# Patient Record
Sex: Female | Born: 1960 | Race: Black or African American | Hispanic: No | Marital: Single | Smoking: Former smoker
Health system: Southern US, Community
[De-identification: ages and names within clinical notes are randomized; demographics above are authoritative.]

## PROBLEM LIST (undated history)

## (undated) DIAGNOSIS — N189 Chronic kidney disease, unspecified: Secondary | ICD-10-CM

## (undated) DIAGNOSIS — I714 Abdominal aortic aneurysm, without rupture, unspecified: Secondary | ICD-10-CM

## (undated) DIAGNOSIS — D649 Anemia, unspecified: Secondary | ICD-10-CM

## (undated) DIAGNOSIS — K439 Ventral hernia without obstruction or gangrene: Secondary | ICD-10-CM

## (undated) DIAGNOSIS — I10 Essential (primary) hypertension: Secondary | ICD-10-CM

## (undated) DIAGNOSIS — K219 Gastro-esophageal reflux disease without esophagitis: Secondary | ICD-10-CM

## (undated) HISTORY — DX: Abdominal aortic aneurysm, without rupture: I71.4

## (undated) HISTORY — PX: FINGER SURGERY: SHX640

## (undated) HISTORY — DX: Ventral hernia without obstruction or gangrene: K43.9

## (undated) HISTORY — DX: Abdominal aortic aneurysm, without rupture, unspecified: I71.40

## (undated) HISTORY — PX: HERNIA REPAIR: SHX51

## (undated) HISTORY — PX: OTHER SURGICAL HISTORY: SHX169

---

## 2001-02-01 ENCOUNTER — Encounter: Payer: Self-pay | Admitting: Emergency Medicine

## 2001-02-01 ENCOUNTER — Inpatient Hospital Stay (HOSPITAL_COMMUNITY): Admission: EM | Admit: 2001-02-01 | Discharge: 2001-02-04 | Payer: Self-pay | Admitting: Emergency Medicine

## 2008-05-26 ENCOUNTER — Emergency Department (HOSPITAL_COMMUNITY): Admission: EM | Admit: 2008-05-26 | Discharge: 2008-05-26 | Payer: Self-pay | Admitting: Emergency Medicine

## 2010-07-23 LAB — DIFFERENTIAL
Eosinophils Absolute: 0.1 10*3/uL (ref 0.0–0.7)
Eosinophils Relative: 1 % (ref 0–5)
Lymphs Abs: 1.3 10*3/uL (ref 0.7–4.0)
Monocytes Relative: 5 % (ref 3–12)

## 2010-07-23 LAB — COMPREHENSIVE METABOLIC PANEL
ALT: 24 U/L (ref 0–35)
AST: 27 U/L (ref 0–37)
CO2: 24 mEq/L (ref 19–32)
Calcium: 9.8 mg/dL (ref 8.4–10.5)
GFR calc Af Amer: >60 mL/min (ref >60)
GFR calc non Af Amer: >60 mL/min (ref >60)
Sodium: 140 mEq/L (ref 135–145)
Total Protein: 8.2 g/dL (ref 6.0–8.3)

## 2010-07-23 LAB — CBC
HCT: 42.2 % (ref 36.0–46.0)
Hemoglobin: 14.2 g/dL (ref 12.0–15.0)
MCHC: 33.7 g/dL (ref 30.0–36.0)
MCV: 88.8 fL (ref 78.0–100.0)
Platelets: 379 10*3/uL (ref 150–400)
RBC: 4.75 MIL/uL (ref 3.87–5.11)
RDW: 19.3 % — ABNORMAL HIGH (ref 11.5–15.5)
WBC: 14.3 10*3/uL — ABNORMAL HIGH (ref 4.0–10.5)

## 2010-07-23 LAB — URINE CULTURE: Colony Count: 15000

## 2010-07-23 LAB — ETHANOL: Alcohol, Ethyl (B): 37 mg/dL — ABNORMAL HIGH (ref 0–10)

## 2010-07-23 LAB — ACETAMINOPHEN LEVEL: Acetaminophen (Tylenol), Serum: <10 ug/mL — ABNORMAL LOW (ref 10–30)

## 2010-07-23 LAB — RAPID URINE DRUG SCREEN, HOSP PERFORMED
Cocaine: POSITIVE — AB
Tetrahydrocannabinol: NOT DETECTED

## 2010-07-23 LAB — URINALYSIS, ROUTINE W REFLEX MICROSCOPIC
Glucose, UA: NEGATIVE mg/dL
Ketones, ur: NEGATIVE mg/dL
pH: 6 (ref 5.0–8.0)

## 2010-07-23 LAB — TRICYCLICS SCREEN, URINE: TCA Scrn: NOT DETECTED

## 2010-07-23 LAB — URINE MICROSCOPIC-ADD ON

## 2010-08-23 NOTE — Op Note (Signed)
[facility]  Patient:    Dorothy Harris, Dorothy Harris Visit Number: [id number] MRN: [id number]          Service Type: MED Location: 3A A304 01 Attending Physician:  Saunders Revel Dictated by:   Irving Shows, M.D. Proc. Date: 02/02/01 Admit Date:  02/01/2001                             Operative Report  PREOPERATIVE DIAGNOSIS:  Small bowel obstruction.  POSTOPERATIVE DIAGNOSES: 1. Incisional hernia with incarceration and intestinal obstruction. 2. Extensive small bowel adhesions.  PROCEDURE:  Exploratory laparotomy with adhesiolysis and incisional hernia repair.  SURGEON:  Irving Shows, M.D.  DESCRIPTION OF PROCEDURE:  Under general anesthesia, the patients abdomen was prepped and draped in a sterile field.  The old midline scar was excised. Dissection in the infraumbilical midline revealed a large hernia sac which protruded out into the right lower quadrant.  The sac was dissected from the surrounding fatty tissues.  The sac was opened and incarcerated small bowel with early strangulation and dusky appearance was encountered in what appeared to be an early _______ obstruction.  Adhesions were taken down.  The bowel looked healthy after about 2 minutes and was introduced back into the abdomen. There was free fluid in the pelvis which was suctioned.  The incision was opened further up towards the umbilicus where a large incisional hernia with incarcerated omentum was encountered.  The hernia sac was dissected free and the omentum was reduced back into the abdomen.  The incision was then extended to the most superior aspect where another smaller hernia was encountered with incarcerated omentum.  The sac was dissected back to good fascia.  Next, the small bowel was inspected and there was some other areas of adhesions in the small bowel loops.  These adhesions were lysed uneventfully. The small bowel was then run from the ligament of Treitz to the  ileocecal area.  The cecum and ascending colon were normal and the descending and sigmoid colon and rectum were normal.  The uterus and ovaries appeared unremarkable.  There did not seem to be any pelvic infection.  The abdomen was then closed, taking care to close good fascia using a running #1 Prolene.  The subcutaneous tissue was closed with a running 3-0 Biosyn. The skin was closed with staples.  A dressing was placed.  She was awakened from anesthesia, transferred to a bed, and taken to the postanesthetic care unit. Dictated by:   Irving Shows, M.D. Attending Physician:  Saunders Revel DD:  02/02/01 TD:  02/03/01 Job: [id number]
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
## 2015-01-24 ENCOUNTER — Emergency Department (HOSPITAL_COMMUNITY)
Admission: EM | Admit: 2015-01-24 | Discharge: 2015-01-24 | Disposition: A | Discharge status: Home / Self Care | Payer: 59 | Attending: Emergency Medicine | Admitting: Emergency Medicine

## 2015-01-24 ENCOUNTER — Encounter (HOSPITAL_COMMUNITY): Payer: Self-pay | Admitting: Emergency Medicine

## 2015-01-24 DIAGNOSIS — I10 Essential (primary) hypertension: Secondary | ICD-10-CM | POA: Insufficient documentation

## 2015-01-24 DIAGNOSIS — R040 Epistaxis: Secondary | ICD-10-CM

## 2015-01-24 DIAGNOSIS — Z720 Tobacco use: Secondary | ICD-10-CM | POA: Diagnosis not present

## 2015-01-24 DIAGNOSIS — R0981 Nasal congestion: Secondary | ICD-10-CM | POA: Diagnosis not present

## 2015-01-24 HISTORY — DX: Essential (primary) hypertension: I10

## 2015-01-24 LAB — CBC WITH DIFFERENTIAL/PLATELET
BASOS PCT: 0 %
Basophils Absolute: 0 10*3/uL (ref 0.0–0.1)
EOS ABS: 0.2 10*3/uL (ref 0.0–0.7)
EOS PCT: 2 %
HCT: 38.9 % (ref 36.0–46.0)
HEMOGLOBIN: 12.9 g/dL (ref 12.0–15.0)
LYMPHS ABS: 2.5 10*3/uL (ref 0.7–4.0)
Lymphocytes Relative: 30 %
MCH: 31.7 pg (ref 26.0–34.0)
MCHC: 33.2 g/dL (ref 30.0–36.0)
MCV: 95.6 fL (ref 78.0–100.0)
MONOS PCT: 6 %
Monocytes Absolute: 0.5 10*3/uL (ref 0.1–1.0)
NEUTROS PCT: 62 %
Neutro Abs: 5.1 10*3/uL (ref 1.7–7.7)
PLATELETS: 266 10*3/uL (ref 150–400)
RBC: 4.07 MIL/uL (ref 3.87–5.11)
RDW: 15 % (ref 11.5–15.5)
WBC: 8.4 10*3/uL (ref 4.0–10.5)

## 2015-01-24 LAB — I-STAT CHEM 8, ED
BUN: 15 mg/dL (ref 6–20)
CALCIUM ION: 1.16 mmol/L (ref 1.12–1.23)
Chloride: 105 mmol/L (ref 101–111)
Creatinine, Ser: 1 mg/dL (ref 0.44–1.00)
Glucose, Bld: 105 mg/dL — ABNORMAL HIGH (ref 65–99)
HEMATOCRIT: 40 % (ref 36.0–46.0)
Hemoglobin: 13.6 g/dL (ref 12.0–15.0)
Potassium: 4.3 mmol/L (ref 3.5–5.1)
SODIUM: 136 mmol/L (ref 135–145)
TCO2: 20 mmol/L (ref 0–100)

## 2015-01-24 MED ORDER — ACETAMINOPHEN 325 MG PO TABS
325.0000 mg | ORAL_TABLET | Freq: Once | ORAL | Status: AC
  Administered 2015-01-24: 325 mg via ORAL
  Filled 2015-01-24: qty 1

## 2015-01-24 MED ORDER — OXYMETAZOLINE HCL 0.05 % NA SOLN
NASAL | Status: AC
  Filled 2015-01-24: qty 15

## 2015-01-24 MED ORDER — HYDROCHLOROTHIAZIDE 25 MG PO TABS: 25.0000 mg | ORAL_TABLET | Freq: Every day | ORAL | Status: DC

## 2015-01-24 NOTE — ED Notes (Signed)
Pt requesting pain medication for headache. MD notified. Order given.

## 2015-01-24 NOTE — ED Notes (Signed)
Pt states that she has been having nose bleeds off and on since Sunday - states that today she has had x3 nose bleeds -- Not bleeding at this time in triage

## 2015-01-24 NOTE — ED Notes (Addendum)
Pt c/o having intermittent nose bleeds since Saturday. Pt reports 10-15 nose bleeds since Sunday. Pt reports she hasn't had nose bleeds since she was a young child. Pt reports the nose bleeds last approximately 3 minutes each. Denies strong flow of blood from her nose, states "its a little trickle". No current bleeding in ED. Pt denies use of anticoagulants or ASA.

## 2015-01-24 NOTE — ED Notes (Signed)
Dr. Thurnell Garbe aware of bp and says  ok to d/c pt because bp prescription given.  Stressed importance of taking the bp medication to pt and instructed to return as needed.

## 2015-01-24 NOTE — Discharge Instructions (Signed)
Emergency Department Resource Guide 1) Find a Doctor and Pay Out of Pocket Although you won't have to find out who is covered by your insurance plan, it is a good idea to ask around and get recommendations. You will then need to call the office and see if the doctor you have chosen will accept you as a new patient and what types of options they offer for patients who are self-pay. Some doctors offer discounts or will set up payment plans for their patients who do not have insurance, but you will need to ask so you aren't surprised when you get to your appointment.  2) Contact Your Local Health Department Not all health departments have doctors that can see patients for sick visits, but many do, so it is worth a call to see if yours does. If you don't know where your local health department is, you can check in your phone book. The CDC also has a tool to help you locate your state's health department, and many state websites also have listings of all of their local health departments.  3) Find a [facility] If your illness is not likely to be very severe or complicated, you may want to try a walk in clinic. These are popping up all over the country in pharmacies, drugstores, and shopping centers. They're usually staffed by nurse practitioners or physician assistants that have been trained to treat common illnesses and complaints. They're usually fairly quick and inexpensive. However, if you have serious medical issues or chronic medical problems, these are probably not your best option.  No Primary Care Doctor: - Call Health Connect at  [phone] - they can help you locate a primary care doctor that  accepts your insurance, provides certain services, etc. - Physician Referral Service- [phone]  Chronic Pain Problems: Organization         Address  Phone   Notes  [facility]  [phone] Patients need to be referred by their primary care doctor.   Medication  Assistance: Organization         Address  Phone   Notes  [facility] Medication [facility] [address] [place], [place] [address] [phone] --Must be a resident of [facility] -- Must have NO insurance coverage whatsoever (no Medicaid/ Medicare, etc.) -- The pt. MUST have a primary care doctor that directs their care regularly and follows them in the community   [facility]ist  [phone]   [employer]  [phone]    Agencies that provide inexpensive medical care: Organization         Address  Phone   Notes  [place]  [phone]   [facility]    [phone]   [facility] [place], [place] [address] [phone]   [place] [address] [place] [phone]   Planned Parenthood    [phone]   [facility]    [phone]   [place] and [place] [address], [place] Phone:  [phone], Fax:  [phone] Hours of Operation:  9 am - 6 pm, M-F.  Also accepts Medicaid/Medicare and self-pay.  [facility] for [place], [address], [place] Phone: [phone], Fax: [phone]. Hours of Operation:  8:30 am - 5:30 pm, M-F.  Also accepts Medicaid and self-pay.  [facility] [address], [place] Phone: [phone]   [place], [place], [place] [phone], Ext. [phone] Mondays & Thursdays: 7-9 AM.  First 15 patients are seen on a first come, first serve basis.    [place] Providers:  Organization         Address  Phone   Notes  [facility] [address], [place] [phone] Also accepts self-pay patients.  [facility] [address], [place]  [phone]   [place], [address], [place]  [phone]   [facility] [address] [place] [phone]   Lucianne Lei [address], [place]   [phone] Only accepts [place] Access [place] patients after they have their name applied to their card.   Self-Pay (no insurance) in [facility]:  Organization         Address  Phone   Notes  Sickle Cell Patients, [facility] [place] [phone]   [facility] [place] [phone]   [facility] [place]  [place], [place], [place] [phone]   [facility]/Dr. Osei-Bonsu  [address] [place] at [place] or [address], [place] [phone] Phone number for both [place] and [place] locations is the same.  Urgent Medical and [facility] [address], [place] [phone]   [facility] [address] [place] or [address] [phone]   [facility] [address] [place] [phone], phone; [phone], fax Sees patients 1st and 3rd Saturday of every month.  Must not qualify for public or private insurance (i.e. Medicaid, Medicare, [place] Health Choice, Veterans' Benefits)  Household income should be no more than 200% of the poverty level The clinic cannot treat you if you are pregnant or think you are pregnant  Sexually transmitted diseases are not treated at the clinic.    Dental Care: Organization         Address  Phone  Notes  [facility] Department of [facility] [place] [phone] Accepts children up to age 38 who are enrolled in [place] or [place]; pregnant women with a Medicaid card; and children who have applied for Medicaid or [place] Health Choice, but were declined, whose parents can pay a reduced fee at time of service.  [facility]  Department of [facility]  [address], [place] [phone] Accepts children up to age 73 who are enrolled in [place] or [place]; pregnant women with a Medicaid card; and children who have applied for Medicaid or [place] Health Choice, but were declined, whose parents can pay a reduced fee at time of service.  [place] [facility] Adult Dental Access PROGRAM  [place] [phone] Patients are seen by appointment only. Walk-ins are not accepted. [place] will see patients 79 years of age and older. Monday - Tuesday (8am-5pm) Most Wednesdays (8:30-5pm) $30 per visit, cash only  [facility] Adult Dental Access PROGRAM  [address], [facility] [phone] Patients are seen by appointment only. Walk-ins are not accepted. [place] will see patients 106 years of age and older. One  Wednesday Evening (Monthly: Volunteer Based).  $30 per visit, cash only  [place]  [phone] for adults; Children under age 14, call Graduate Pediatric Dentistry at [phone]. Children aged 59-14, please call [phone] to request a pediatric application.  Dental services are provided in all areas of dental care including fillings, crowns and bridges, complete and partial dentures, implants, gum treatment, root canals, and extractions. Preventive care is also provided. Treatment is provided to both adults and children. Patients are selected via a lottery and there is often a waiting list.   [facility] [address] [place]  [phone] [email or web address]   [facility] [address] [place], [place] [phone], Ext. [phone] Second and Fourth Thursday of each month, opens at 6:30 AM; Clinic ends at 9 AM.  Patients are seen on a first-come first-served basis, and a limited number are seen during each clinic.   [facility]  [address] Hillard Danker [place], [place] [phone]    Eligibility Requirements You must have lived in [place], [place], or [place] counties for at least the last three months.   You cannot be eligible for state or federal sponsored [employer], including [employer], [place], or [employer].   You generally cannot be eligible for healthcare insurance through your employer.    How to apply: Eligibility screenings are held every Tuesday and Wednesday afternoon from 1:00 pm until 4:00 pm. You do not need an appointment for the interview!  [facility] [address] [place], [place]   [place]  [place] Department  [place]  [phone]    Behavioral Health Resources in the Community: Intensive Outpatient Programs Organization         Address  Phone  Notes  [place]. [address] [place], [place] [phone]   [facility] Outpatient [address] [place], [employer]   ADS: Alcohol & Drug Svcs [address], [place], [place]   [place] [address]  [place], [place] or [phone]   Substance Abuse Resources Organization         Address  Phone  Notes  Alcohol and Drug Services  [phone]   [place]  [phone]   The [place]   Chinita Pester  [phone]   Residential & Outpatient Substance Abuse Program  [phone]   Psychological Services Organization         Address  Phone  Notes  [facility] [place]  [place]  [phone]   [place] [address], [place] or [phone]    Mobile Crisis Teams Organization         Address  Phone  Notes  [facility], Mobile Crisis Care Unit  [phone]   [facility]  [address]  [place], [place]   [place] [address], [place] [phone]    Self-Help/Support Groups Organization         Address  Phone             Notes  [place]. of [place] - variety of support groups  [place] Call for more information  Narcotics Anonymous (NA), [facility] [address], [employer] [place]  2 meetings at this location  Residential Treatment Programs Organization         Address  Phone  Notes  [facility] [address]    [place]  [phone]   [facility]  [address] [place] 625638, [place], [place]   [place], [place] [phone] Admissions: 8am-3pm M-F  Incentives Substance [place] [address]    [place], [place] [phone]   [facility] [address] [place], [place], [place]   [facility] [address],  [place], [place]   [facility] Programs - Intensive Outpatient [address] Kristeen [address], [place], [place]   [facility] ([place].,  [place], [place] [phone] or [phone]   [facility] Services (RTS) [address] [place], [place] Accepts Medicaid  [facility] [place] [address],  [place] [phone] Substance Abuse/Addiction Treatment   [facility] Organization         Address  Phone  Notes  [facility]  [phone]   Domenic Schwab, PhD [address] [place], [place]   [phone] or [phone]   [place], [place] [phone]   [facility] [address] [place], [place] [phone] Insurance/Medicaid/sponsorship through [facility] and Families [address]                                    [place], [place] [phone] [place] [address], [place] [phone]    Dr. Adele Schilder  [phone]   [facility] Dept. 1) [address], [place] 2) [place] 3)  [place] [address], [place] [phone]  [phone]   [place] [phone] or [phone] (After Hours)      Take the prescription as directed.  Use over the counter normal saline nasal spray, as directed on packaging, at least twice a day for the next week.  If you have a nosebleed:  Blow your nose gently, spray the Afrin nose spray in each nostril, then lean your head forward and pinch both of your nostrils firmly and continuously for at least 15 to 20 minutes.  Call your regular medical doctor today to schedule a follow up appointment within the next week.  Return to the Emergency Department immediately sooner if worsening.
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
## 2015-12-22 ENCOUNTER — Encounter (HOSPITAL_COMMUNITY): Payer: Self-pay

## 2015-12-22 ENCOUNTER — Emergency Department (HOSPITAL_COMMUNITY): Payer: Self-pay

## 2015-12-22 ENCOUNTER — Inpatient Hospital Stay (HOSPITAL_COMMUNITY)
Admission: EM | Admit: 2015-12-22 | Discharge: 2015-12-24 | DRG: 282 | Disposition: A | Discharge status: Home / Self Care | Payer: Self-pay | Attending: Cardiology | Admitting: Cardiology

## 2015-12-22 DIAGNOSIS — F17210 Nicotine dependence, cigarettes, uncomplicated: Secondary | ICD-10-CM | POA: Diagnosis present

## 2015-12-22 DIAGNOSIS — K219 Gastro-esophageal reflux disease without esophagitis: Secondary | ICD-10-CM | POA: Diagnosis present

## 2015-12-22 DIAGNOSIS — I214 Non-ST elevation (NSTEMI) myocardial infarction: Principal | ICD-10-CM | POA: Diagnosis present

## 2015-12-22 DIAGNOSIS — I10 Essential (primary) hypertension: Secondary | ICD-10-CM | POA: Diagnosis present

## 2015-12-22 DIAGNOSIS — Z6832 Body mass index (BMI) 32.0-32.9, adult: Secondary | ICD-10-CM

## 2015-12-22 DIAGNOSIS — E785 Hyperlipidemia, unspecified: Secondary | ICD-10-CM | POA: Diagnosis present

## 2015-12-22 DIAGNOSIS — R7303 Prediabetes: Secondary | ICD-10-CM | POA: Diagnosis present

## 2015-12-22 DIAGNOSIS — I249 Acute ischemic heart disease, unspecified: Secondary | ICD-10-CM | POA: Diagnosis present

## 2015-12-22 LAB — BASIC METABOLIC PANEL
ANION GAP: 7 (ref 5–15)
BUN: 21 mg/dL — ABNORMAL HIGH (ref 6–20)
CALCIUM: 8.8 mg/dL — AB (ref 8.9–10.3)
CHLORIDE: 110 mmol/L (ref 101–111)
CO2: 23 mmol/L (ref 22–32)
CREATININE: 1 mg/dL (ref 0.44–1.00)
GFR calc non Af Amer: >60 mL/min (ref >60)
Glucose, Bld: 125 mg/dL — ABNORMAL HIGH (ref 65–99)
Potassium: 4.2 mmol/L (ref 3.5–5.1)
SODIUM: 140 mmol/L (ref 135–145)

## 2015-12-22 LAB — CBC
HCT: 38.5 % (ref 36.0–46.0)
HEMOGLOBIN: 13 g/dL (ref 12.0–15.0)
MCH: 32.2 pg (ref 26.0–34.0)
MCHC: 33.8 g/dL (ref 30.0–36.0)
MCV: 95.3 fL (ref 78.0–100.0)
PLATELETS: 267 10*3/uL (ref 150–400)
RBC: 4.04 MIL/uL (ref 3.87–5.11)
RDW: 15.4 % (ref 11.5–15.5)
WBC: 6.9 10*3/uL (ref 4.0–10.5)

## 2015-12-22 LAB — I-STAT TROPONIN, ED: TROPONIN I, POC: 0.05 ng/mL (ref 0.00–0.08)

## 2015-12-22 LAB — APTT: aPTT: 32 seconds (ref 24–36)

## 2015-12-22 LAB — PROTIME-INR
INR: 0.92
PROTHROMBIN TIME: 12.4 s (ref 11.4–15.2)

## 2015-12-22 LAB — TROPONIN I: Troponin I: 0.09 ng/mL (ref <0.03)

## 2015-12-22 MED ORDER — HEPARIN (PORCINE) IN NACL 100-0.45 UNIT/ML-% IJ SOLN
12.0000 [IU]/kg/h | INTRAMUSCULAR | Status: DC
  Administered 2015-12-22: 12 [IU]/kg/h via INTRAVENOUS
  Filled 2015-12-22: qty 250

## 2015-12-22 MED ORDER — NITROGLYCERIN IN D5W 200-5 MCG/ML-% IV SOLN
5.0000 ug/min | Freq: Once | INTRAVENOUS | Status: AC
  Administered 2015-12-22: 5 ug/min via INTRAVENOUS
  Filled 2015-12-22: qty 250

## 2015-12-22 MED ORDER — NITROGLYCERIN 0.4 MG SL SUBL
0.4000 mg | SUBLINGUAL_TABLET | SUBLINGUAL | Status: DC | PRN
  Administered 2015-12-22 (×2): 0.4 mg via SUBLINGUAL
  Filled 2015-12-22: qty 1

## 2015-12-22 MED ORDER — HEPARIN BOLUS VIA INFUSION
4000.0000 [IU] | Freq: Once | INTRAVENOUS | Status: AC
  Administered 2015-12-22: 4000 [IU] via INTRAVENOUS

## 2015-12-22 MED ORDER — ASPIRIN 81 MG PO CHEW
324.0000 mg | CHEWABLE_TABLET | Freq: Once | ORAL | Status: AC
  Administered 2015-12-22: 324 mg via ORAL
  Filled 2015-12-22: qty 4
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
## 2015-12-22 NOTE — ED Triage Notes (Signed)
Patient c/o of mid sternal chest pain that started 30 minutes PTA. Patient states "i thought I had to throw up" stating it "feels like something is stuck in my chest" denies SOB, denies NV, Denies radiation of pain.

## 2015-12-22 NOTE — ED Notes (Signed)
CRITICAL VALUE ALERT  Critical value received:  Troponin 0.09 Date of notification:  12/22/15  Time of notification:  2256  Critical value read back:Yes.    Nurse who received alert:  JRM  MD notified (1st page):  Sabra Heck  Time of first page:  2256  MD notified (2nd page):  Time of second page:  Responding MD:  Sabra Heck  Time MD responded:  2256

## 2015-12-22 NOTE — ED Notes (Signed)
Pt given some water at this time.

## 2015-12-23 DIAGNOSIS — I249 Acute ischemic heart disease, unspecified: Secondary | ICD-10-CM | POA: Diagnosis present

## 2015-12-23 LAB — CBC WITH DIFFERENTIAL/PLATELET
Basophils Absolute: 0 10*3/uL (ref 0.0–0.1)
Basophils Relative: 0 %
EOS ABS: 0.1 10*3/uL (ref 0.0–0.7)
EOS PCT: 1 %
HCT: 37.1 % (ref 36.0–46.0)
Hemoglobin: 11.8 g/dL — ABNORMAL LOW (ref 12.0–15.0)
LYMPHS ABS: 2.1 10*3/uL (ref 0.7–4.0)
Lymphocytes Relative: 21 %
MCH: 30.5 pg (ref 26.0–34.0)
MCHC: 31.8 g/dL (ref 30.0–36.0)
MCV: 95.9 fL (ref 78.0–100.0)
MONO ABS: 0.8 10*3/uL (ref 0.1–1.0)
MONOS PCT: 7 %
Neutro Abs: 7.3 10*3/uL (ref 1.7–7.7)
Neutrophils Relative %: 71 %
PLATELETS: 252 10*3/uL (ref 150–400)
RBC: 3.87 MIL/uL (ref 3.87–5.11)
RDW: 15.6 % — AB (ref 11.5–15.5)
WBC: 10.2 10*3/uL (ref 4.0–10.5)

## 2015-12-23 LAB — COMPREHENSIVE METABOLIC PANEL
ALBUMIN: 3.9 g/dL (ref 3.5–5.0)
ALK PHOS: 91 U/L (ref 38–126)
ALT: 35 U/L (ref 14–54)
ANION GAP: 6 (ref 5–15)
AST: 27 U/L (ref 15–41)
BUN: 11 mg/dL (ref 6–20)
CALCIUM: 9.1 mg/dL (ref 8.9–10.3)
CHLORIDE: 109 mmol/L (ref 101–111)
CO2: 22 mmol/L (ref 22–32)
Creatinine, Ser: 0.88 mg/dL (ref 0.44–1.00)
GFR calc non Af Amer: >60 mL/min (ref >60)
Glucose, Bld: 115 mg/dL — ABNORMAL HIGH (ref 65–99)
Potassium: 3.9 mmol/L (ref 3.5–5.1)
SODIUM: 137 mmol/L (ref 135–145)
Total Bilirubin: 0.9 mg/dL (ref 0.3–1.2)
Total Protein: 6.5 g/dL (ref 6.5–8.1)

## 2015-12-23 LAB — TSH: TSH: 0.949 u[IU]/mL (ref 0.350–4.500)

## 2015-12-23 LAB — PROTIME-INR
INR: 1.04
PROTHROMBIN TIME: 13.6 s (ref 11.4–15.2)

## 2015-12-23 LAB — TROPONIN I
TROPONIN I: 0.07 ng/mL — AB (ref <0.03)
TROPONIN I: 0.06 ng/mL — AB (ref <0.03)
Troponin I: 0.06 ng/mL (ref <0.03)
Troponin I: 0.06 ng/mL (ref <0.03)

## 2015-12-23 LAB — HEPARIN LEVEL (UNFRACTIONATED): Heparin Unfractionated: 0.45 IU/mL (ref 0.30–0.70)

## 2015-12-23 LAB — MAGNESIUM: MAGNESIUM: 2.1 mg/dL (ref 1.7–2.4)

## 2015-12-23 LAB — GLUCOSE, CAPILLARY: Glucose-Capillary: 149 mg/dL — ABNORMAL HIGH (ref 65–99)

## 2015-12-23 LAB — MRSA PCR SCREENING: MRSA BY PCR: NEGATIVE

## 2015-12-23 MED ORDER — ONDANSETRON HCL 4 MG/2ML IJ SOLN: 4.0000 mg | Freq: Four times a day (QID) | INTRAMUSCULAR | Status: DC | PRN

## 2015-12-23 MED ORDER — AMLODIPINE BESYLATE 5 MG PO TABS
5.0000 mg | ORAL_TABLET | Freq: Every day | ORAL | Status: DC
  Administered 2015-12-23 – 2015-12-24 (×2): 5 mg via ORAL
  Filled 2015-12-23 (×2): qty 1

## 2015-12-23 MED ORDER — ASPIRIN EC 81 MG PO TBEC
81.0000 mg | DELAYED_RELEASE_TABLET | Freq: Every day | ORAL | Status: DC
  Administered 2015-12-24: 81 mg via ORAL
  Filled 2015-12-23 (×2): qty 1

## 2015-12-23 MED ORDER — LISINOPRIL 20 MG PO TABS
20.0000 mg | ORAL_TABLET | Freq: Every day | ORAL | Status: DC
  Administered 2015-12-23 – 2015-12-24 (×2): 20 mg via ORAL
  Filled 2015-12-23 (×2): qty 1

## 2015-12-23 MED ORDER — ASPIRIN 81 MG PO CHEW
324.0000 mg | CHEWABLE_TABLET | ORAL | Status: AC
  Administered 2015-12-23: 324 mg via ORAL
  Filled 2015-12-23: qty 4

## 2015-12-23 MED ORDER — CLOPIDOGREL BISULFATE 75 MG PO TABS
300.0000 mg | ORAL_TABLET | Freq: Once | ORAL | Status: AC
Start: 2015-12-23 — End: 2015-12-23
  Administered 2015-12-23: 300 mg via ORAL
  Filled 2015-12-23: qty 4

## 2015-12-23 MED ORDER — ASPIRIN 300 MG RE SUPP: 300.0000 mg | RECTAL | Status: AC

## 2015-12-23 MED ORDER — ACETAMINOPHEN 325 MG PO TABS
650.0000 mg | ORAL_TABLET | ORAL | Status: DC | PRN
Start: 2015-12-23 — End: 2015-12-24
  Administered 2015-12-23 – 2015-12-24 (×5): 650 mg via ORAL
  Filled 2015-12-23 (×5): qty 2

## 2015-12-23 MED ORDER — SODIUM CHLORIDE 0.9 % WEIGHT BASED INFUSION
1.0000 mL/kg/h | INTRAVENOUS | Status: DC
  Administered 2015-12-23: 1 mL/kg/h via INTRAVENOUS

## 2015-12-23 MED ORDER — ONDANSETRON HCL 4 MG/2ML IJ SOLN
4.0000 mg | Freq: Once | INTRAMUSCULAR | Status: AC
  Administered 2015-12-23: 4 mg via INTRAVENOUS
  Filled 2015-12-23: qty 2

## 2015-12-23 MED ORDER — SODIUM CHLORIDE 0.9 % IV SOLN: 250.0000 mL | INTRAVENOUS | Status: DC | PRN

## 2015-12-23 MED ORDER — PANTOPRAZOLE SODIUM 40 MG PO TBEC
40.0000 mg | DELAYED_RELEASE_TABLET | Freq: Every day | ORAL | Status: DC
  Administered 2015-12-23 – 2015-12-24 (×2): 40 mg via ORAL
  Filled 2015-12-23 (×2): qty 1

## 2015-12-23 MED ORDER — SODIUM CHLORIDE 0.9% FLUSH: 3.0000 mL | INTRAVENOUS | Status: DC | PRN

## 2015-12-23 MED ORDER — ATORVASTATIN CALCIUM 80 MG PO TABS
80.0000 mg | ORAL_TABLET | Freq: Every day | ORAL | Status: DC
  Administered 2015-12-23: 80 mg via ORAL
  Filled 2015-12-23: qty 1

## 2015-12-23 MED ORDER — CLOPIDOGREL BISULFATE 75 MG PO TABS
75.0000 mg | ORAL_TABLET | Freq: Every day | ORAL | Status: DC
  Administered 2015-12-24: 75 mg via ORAL
  Filled 2015-12-23: qty 1

## 2015-12-23 MED ORDER — NITROGLYCERIN IN D5W 200-5 MCG/ML-% IV SOLN
0.0000 ug/min | INTRAVENOUS | Status: DC
  Administered 2015-12-23: 30 ug/min via INTRAVENOUS
  Filled 2015-12-23: qty 250

## 2015-12-23 MED ORDER — HEPARIN (PORCINE) IN NACL 100-0.45 UNIT/ML-% IJ SOLN
1100.0000 [IU]/h | INTRAMUSCULAR | Status: DC
  Administered 2015-12-23: 1100 [IU]/h via INTRAVENOUS
  Filled 2015-12-23: qty 250

## 2015-12-23 MED ORDER — METOPROLOL TARTRATE 12.5 MG HALF TABLET
12.5000 mg | ORAL_TABLET | Freq: Two times a day (BID) | ORAL | Status: DC
  Administered 2015-12-23 – 2015-12-24 (×3): 12.5 mg via ORAL
  Filled 2015-12-23 (×3): qty 1

## 2015-12-23 MED ORDER — MORPHINE SULFATE (PF) 2 MG/ML IV SOLN
2.0000 mg | Freq: Once | INTRAVENOUS | Status: AC
  Administered 2015-12-23: 2 mg via INTRAVENOUS
  Filled 2015-12-23: qty 1

## 2015-12-23 MED ORDER — SODIUM CHLORIDE 0.9% FLUSH
3.0000 mL | Freq: Two times a day (BID) | INTRAVENOUS | Status: DC
  Administered 2015-12-23: 3 mL via INTRAVENOUS

## 2015-12-23 NOTE — ED Provider Notes (Signed)
Patient still waiting to be transported to [facility] to be evaluated by cardiology. Evidently she had more chest pain and the admitting cardiologist instructed the nurse to repeat her EKG.   EKG Interpretation  Date/Time:  Sunday December 23 2015 03:05:15 EDT Ventricular Rate:  64 PR Interval:  146 QRS Duration: 81 QT Interval:  441 QTC Calculation: 455 R Axis:   12 Text Interpretation:  Sinus rhythm Abnormal R-wave progression, early transition LVH with secondary repolarization abnormality Since last tracing of earlier today T wave inversion more evident in lateral leads Confirmed by Dinorah Masullo  MD-I, Migdalia Olejniczak ([id number]) on 12/23/2015 3:42:31 AM      Rolland Porter, MD, Barbette Or, MD 12/23/15 [phone]

## 2015-12-23 NOTE — ED Notes (Signed)
Report given to [facility] at this time. ETA 20 min
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
## 2015-12-23 NOTE — ED Notes (Signed)
Pt says was in kitchen & felt like a knot in her chest & that she was going to throw up.

## 2015-12-23 NOTE — ED Notes (Signed)
Report given to Clarise Cruz, [place] for room [phone]

## 2015-12-23 NOTE — ED Notes (Signed)
Pt called saying chest pain returning. Increased nitro,

## 2015-12-23 NOTE — Progress Notes (Signed)
ANTICOAGULATION CONSULT NOTE - Initial Consult  Pharmacy Consult for heparin Indication: chest pain/ACS  No Known Allergies  Patient Measurements: Height: 5\' 7"  (170.2 cm) Weight: 210 lb (95.3 kg) IBW/kg (Calculated) : 61.6 Heparin Dosing Weight: 82.5kg  Vital Signs: Temp: 98.2 F (36.8 C) (09/17 0946) Temp Source: Oral (09/17 0946) BP: 152/109 (09/17 0854) Pulse Rate: 69 (09/17 0946)  Labs:  Recent Labs  12/22/15 2111 12/23/15 0312 12/23/15 1040  HGB 13.0  --  11.8*  HCT 38.5  --  37.1  PLT 267  --  252  APTT 32  --   --   LABPROT 12.4  --  13.6  INR 0.92  --  1.04  HEPARINUNFRC  --   --  0.45  CREATININE 1.00  --  0.88  TROPONINI 0.09* 0.07* 0.06*    Estimated Creatinine Clearance: 86.6 mL/min (by C-G formula based on SCr of 0.88 mg/dL).   Medical History: Past Medical History:  Diagnosis Date  . Hypertension    Assessment: -Originally was run overnight without any pharmacy involvement, dosed at 12 units/kg/hr (1144 units/hr). I contacted the dr in the AM and put a pharmacy consult for the patient.  -HL 0.45 at a rate of 1144 units/hr  -Heparin dosing weight: 82.5kg -Hgb, Plts stable -No S/Sx of bleeding  Goal of Therapy:  Heparin level 0.3-0.7 units/ml Monitor platelets by anticoagulation protocol: Yes   Plan:  -Gave Heparin 4000 unit bolus given last night -Change heparin 1100 units/hr (I rounded down to 1100 units/hr per our nomogram suggesting we round down, not up) -Daily HL, CBC -Monitoring for S/Sx of bleeding  Myer Peer Grayland Ormond), PharmD  [id number] Pharmacy Resident Pager: [phone] 12/23/2015 12:05 PM

## 2015-12-23 NOTE — ED Notes (Signed)
Pt ambulated to restroom & when returned back to stretcher pt chest pain had returned.

## 2015-12-23 NOTE — ED Notes (Signed)
Pt states chest pain is better but still complaining of back & epigastric/nauseated pain.

## 2015-12-24 ENCOUNTER — Encounter (HOSPITAL_COMMUNITY): Payer: Self-pay | Admitting: Cardiology

## 2015-12-24 ENCOUNTER — Encounter (HOSPITAL_COMMUNITY)
Admission: EM | Disposition: A | Discharge status: Home / Self Care | Payer: Self-pay | Source: Home / Self Care | Attending: Cardiology

## 2015-12-24 HISTORY — PX: CARDIAC CATHETERIZATION: SHX172

## 2015-12-24 LAB — LIPID PANEL
CHOL/HDL RATIO: 3.9 ratio
CHOLESTEROL: 157 mg/dL (ref 0–200)
HDL: 40 mg/dL — ABNORMAL LOW (ref >40)
LDL CALC: 84 mg/dL (ref 0–99)
Triglycerides: 165 mg/dL — ABNORMAL HIGH (ref <150)
VLDL: 33 mg/dL (ref 0–40)

## 2015-12-24 LAB — BASIC METABOLIC PANEL
Anion gap: 7 (ref 5–15)
BUN: 10 mg/dL (ref 6–20)
CHLORIDE: 108 mmol/L (ref 101–111)
CO2: 23 mmol/L (ref 22–32)
CREATININE: 0.93 mg/dL (ref 0.44–1.00)
Calcium: 8.7 mg/dL — ABNORMAL LOW (ref 8.9–10.3)
GFR calc Af Amer: >60 mL/min (ref >60)
GFR calc non Af Amer: >60 mL/min (ref >60)
GLUCOSE: 128 mg/dL — AB (ref 65–99)
POTASSIUM: 4 mmol/L (ref 3.5–5.1)
SODIUM: 138 mmol/L (ref 135–145)

## 2015-12-24 LAB — HEMOGLOBIN A1C
Hgb A1c MFr Bld: 6.1 % — ABNORMAL HIGH (ref 4.8–5.6)
Mean Plasma Glucose: 128 mg/dL

## 2015-12-24 LAB — CBC
HCT: 33.8 % — ABNORMAL LOW (ref 36.0–46.0)
HEMOGLOBIN: 10.8 g/dL — AB (ref 12.0–15.0)
MCH: 30.9 pg (ref 26.0–34.0)
MCHC: 32 g/dL (ref 30.0–36.0)
MCV: 96.6 fL (ref 78.0–100.0)
PLATELETS: 223 10*3/uL (ref 150–400)
RBC: 3.5 MIL/uL — AB (ref 3.87–5.11)
RDW: 15.8 % — ABNORMAL HIGH (ref 11.5–15.5)
WBC: 9.4 10*3/uL (ref 4.0–10.5)

## 2015-12-24 LAB — POCT ACTIVATED CLOTTING TIME: Activated Clotting Time: 136 seconds

## 2015-12-24 LAB — HEPARIN LEVEL (UNFRACTIONATED): HEPARIN UNFRACTIONATED: 0.33 [IU]/mL (ref 0.30–0.70)

## 2015-12-24 SURGERY — LEFT HEART CATH AND CORONARY ANGIOGRAPHY
Anesthesia: LOCAL

## 2015-12-24 MED ORDER — IOPAMIDOL (ISOVUE-370) INJECTION 76%
INTRAVENOUS | Status: DC | PRN
  Administered 2015-12-24: 80 mL via INTRA_ARTERIAL

## 2015-12-24 MED ORDER — LIDOCAINE HCL (PF) 1 % IJ SOLN
INTRAMUSCULAR | Status: DC | PRN
  Administered 2015-12-24: 17 mL

## 2015-12-24 MED ORDER — IOPAMIDOL (ISOVUE-370) INJECTION 76%
INTRAVENOUS | Status: AC
  Filled 2015-12-24: qty 100

## 2015-12-24 MED ORDER — SODIUM CHLORIDE 0.9 % WEIGHT BASED INFUSION: 3.0000 mL/kg/h | INTRAVENOUS | Status: AC

## 2015-12-24 MED ORDER — METOPROLOL TARTRATE 25 MG PO TABS: 12.5000 mg | ORAL_TABLET | Freq: Two times a day (BID) | ORAL | 3 refills | Status: DC

## 2015-12-24 MED ORDER — HYDRALAZINE HCL 20 MG/ML IJ SOLN
INTRAMUSCULAR | Status: DC | PRN
  Administered 2015-12-24 (×2): 10 mg via INTRAVENOUS

## 2015-12-24 MED ORDER — LIDOCAINE HCL (PF) 1 % IJ SOLN
INTRAMUSCULAR | Status: AC
  Filled 2015-12-24: qty 30

## 2015-12-24 MED ORDER — NITROGLYCERIN 0.4 MG SL SUBL: 0.4000 mg | SUBLINGUAL_TABLET | SUBLINGUAL | 12 refills | Status: DC | PRN

## 2015-12-24 MED ORDER — ONDANSETRON HCL 4 MG/2ML IJ SOLN: 4.0000 mg | Freq: Four times a day (QID) | INTRAMUSCULAR | Status: DC | PRN

## 2015-12-24 MED ORDER — FENTANYL CITRATE (PF) 100 MCG/2ML IJ SOLN
INTRAMUSCULAR | Status: DC | PRN
  Administered 2015-12-24 (×2): 25 ug via INTRAVENOUS

## 2015-12-24 MED ORDER — SODIUM CHLORIDE 0.9% FLUSH: 3.0000 mL | INTRAVENOUS | Status: DC | PRN

## 2015-12-24 MED ORDER — FENTANYL CITRATE (PF) 100 MCG/2ML IJ SOLN
INTRAMUSCULAR | Status: AC
  Filled 2015-12-24: qty 2

## 2015-12-24 MED ORDER — HYDRALAZINE HCL 20 MG/ML IJ SOLN
INTRAMUSCULAR | Status: AC
  Filled 2015-12-24: qty 1

## 2015-12-24 MED ORDER — PANTOPRAZOLE SODIUM 40 MG PO TBEC: 40.0000 mg | DELAYED_RELEASE_TABLET | Freq: Every day | ORAL | 3 refills | Status: DC

## 2015-12-24 MED ORDER — LISINOPRIL 20 MG PO TABS: 20.0000 mg | ORAL_TABLET | Freq: Two times a day (BID) | ORAL | 3 refills | Status: DC

## 2015-12-24 MED ORDER — HEPARIN (PORCINE) IN NACL 2-0.9 UNIT/ML-% IJ SOLN
INTRAMUSCULAR | Status: AC
  Filled 2015-12-24: qty 1500

## 2015-12-24 MED ORDER — OXYCODONE-ACETAMINOPHEN 5-325 MG PO TABS
1.0000 | ORAL_TABLET | ORAL | Status: DC | PRN
  Administered 2015-12-24: 1 via ORAL
  Filled 2015-12-24: qty 1

## 2015-12-24 MED ORDER — ATORVASTATIN CALCIUM 20 MG PO TABS: 20.0000 mg | ORAL_TABLET | Freq: Every day | ORAL | 3 refills | Status: DC

## 2015-12-24 MED ORDER — SODIUM CHLORIDE 0.9 % IV SOLN: 250.0000 mL | INTRAVENOUS | Status: DC | PRN

## 2015-12-24 MED ORDER — SODIUM CHLORIDE 0.9% FLUSH: 3.0000 mL | Freq: Two times a day (BID) | INTRAVENOUS | Status: DC

## 2015-12-24 MED ORDER — MIDAZOLAM HCL 2 MG/2ML IJ SOLN
INTRAMUSCULAR | Status: AC
Start: 2015-12-24 — End: 2015-12-24
  Filled 2015-12-24: qty 2

## 2015-12-24 MED ORDER — HEPARIN (PORCINE) IN NACL 2-0.9 UNIT/ML-% IJ SOLN
INTRAMUSCULAR | Status: DC | PRN
  Administered 2015-12-24: 10:00:00

## 2015-12-24 MED ORDER — ASPIRIN 81 MG PO TBEC: 81.0000 mg | DELAYED_RELEASE_TABLET | Freq: Every day | ORAL | 3 refills | Status: DC

## 2015-12-24 MED ORDER — MIDAZOLAM HCL 2 MG/2ML IJ SOLN
INTRAMUSCULAR | Status: DC | PRN
  Administered 2015-12-24 (×2): 1 mg via INTRAVENOUS

## 2015-12-24 SURGICAL SUPPLY — 9 items
CATH INFINITI 5 FR STR PIGTAIL (CATHETERS) ×1 IMPLANT
CATH INFINITI 5FR JL4 (CATHETERS) ×1 IMPLANT
CATH INFINITI JR4 5F (CATHETERS) ×1 IMPLANT
KIT HEART LEFT (KITS) ×2 IMPLANT
PACK CARDIAC CATHETERIZATION (CUSTOM PROCEDURE TRAY) ×2 IMPLANT
SHEATH PINNACLE 5F 10CM (SHEATH) ×1 IMPLANT
SYR MEDRAD MARK V 150ML (SYRINGE) ×2 IMPLANT
TRANSDUCER W/STOPCOCK (MISCELLANEOUS) ×2 IMPLANT
WIRE EMERALD 3MM-J .035X150CM (WIRE) ×1 IMPLANT

## 2015-12-24 NOTE — Discharge Summary (Signed)
Discharge summary dictated on 12/24/2015 dictation number is [phone]

## 2015-12-24 NOTE — Discharge Instructions (Signed)
Angiogram An angiogram is an X-ray test. It is used to look at your blood vessels. For this test, a dye is put into the blood vessel being checked. The dye shows up on X-rays. It helps your doctor see if there is a blockage or other problem in the blood vessel. BEFORE THE PROCEDURE  Follow your doctor's instructions about limiting what you eat or drink.  Ask your doctor if you may drink enough water to take any needed medicines the morning of the test.  Plan to have someone take you home after the test.  If you go home the same day as the test, plan to have someone stay with you for 24 hours. PROCEDURE   An IV tube will be put into one of your veins.  You will be given a medicine that makes you relax (sedative).  Your skin will be washed and shaved where the thin tube (catheter) will be inserted. This will usually be done in the upper part of your leg (groin). It may also be done in your arm near the elbow or in your wrist.  You will be given a medicine that numbs the area where the tube will be inserted (local anesthetic).  The tube will be inserted into a blood vessel.  Using a type of X-ray (fluoroscopy) to see, your doctor will move the tube into the blood vessel to check it.  Dye will be put in through the tube. X-rays of your blood vessels will then be taken. Different health care providers and hospitals may do this procedure differently. AFTER THE PROCEDURE   If the test is done through the leg, you will be kept in bed lying flat for several hours. You will be told to not bend or cross your legs.   The area where the tube was inserted will be checked often.   The pulse in your feet or wrist will be checked often.   More tests or X-rays may be done.    This information is not intended to replace advice given to you by your health care provider. Make sure you discuss any questions you have with your health care provider.   Document Released: 06/20/2008 Document  Revised: 04/14/2014 Document Reviewed: 08/25/2012 Elsevier Interactive Patient Education 2016 Else[employer]  Angina Pectoris Angina pectoris, often called angina, is extreme discomfort in the chest, neck, or arm. This is caused by a lack of blood in the middle and thickest layer of the heart wall (myocardium). There are four types of angina:  Stable angina. Stable angina usually occurs in episodes of predictable frequency and duration. It is usually brought on by physical activity, stress, or excitement. Stable angina usually lasts a few minutes and can often be relieved by a medicine that you place under your tongue. This medicine is called sublingual nitroglycerin.  Unstable angina. Unstable angina can occur even when you are doing little or no physical activity. It can even occur while you are sleeping or when you are at rest. It can suddenly increase in severity or frequency. It may not be relieved by sublingual nitroglycerin, and it can last up to 30 minutes.  Microvascular angina. This type of angina is caused by a disorder of tiny blood vessels called arterioles. Microvascular angina is more common in women. The pain may be more severe and last longer than other types of angina pectoris.  Prinzmetal or variant angina. This type of angina pectoris is rare and usually occurs when you are doing little or  no physical activity. It especially occurs in the early morning hours. CAUSES Atherosclerosis is the cause of angina. This is the buildup of fat and cholesterol (plaque) on the inside of the arteries. Over time, the plaque may narrow or block the artery, and this will lessen blood flow to the heart. Plaque can also become weak and break off within a coronary artery to form a clot and cause a sudden blockage. RISK FACTORS Risk factors common to both men and women include:  High cholesterol levels.  High blood pressure (hypertension).  Tobacco use.  Diabetes.  Family history of  angina.  Obesity.  Lack of exercise.  A diet high in saturated fats. Women are at greater risk for angina if they are:  Over age 1.  Postmenopausal. SYMPTOMS Many people do not experience any symptoms during the early stages of angina. As the condition progresses, symptoms common to both men and women may include:  Chest pain.  The pain can be described as a crushing or squeezing in the chest, or a tightness, pressure, fullness, or heaviness in the chest.  The pain can last more than a few minutes, or it can stop and recur.  Pain in the arms, neck, jaw, or back.  Unexplained heartburn or indigestion.  Shortness of breath.  Nausea.  Sudden cold sweats.  Sudden light-headedness. Many women have chest discomfort and some of the other symptoms. However, women often have different (atypical) symptoms, such as:   Fatigue.  Unexplained feelings of nervousness or anxiety.  Unexplained weakness.  Dizziness or fainting. Sometimes, women may have angina without any symptoms. DIAGNOSIS  Tests to diagnose angina may include:  ECG (electrocardiogram).  Exercise stress test. This looks for signs of blockage when the heart is being exercised.  Pharmacologic stress test. This test looks for signs of blockage when the heart is being stressed with a medicine.  Blood tests.  Coronary angiogram. This is a procedure to look at the coronary arteries to see if there is any blockage. TREATMENT  The treatment of angina may include the following:  Healthy behavioral changes to reduce or control risk factors.  Medicine.  Coronary stenting.A stent helps to keep an artery open.  Coronary angioplasty. This procedure widens a narrowed or blocked artery.  Coronary arterybypass surgery. This will allow your blood to pass the blockage (bypass) to reach your heart. HOME CARE INSTRUCTIONS   Take medicines only as directed by your health care provider.  Do not take the following  medicines unless your health care provider approves:  Nonsteroidal anti-inflammatory drugs (NSAIDs), such as ibuprofen, naproxen, or celecoxib.  Vitamin supplements that contain vitamin A, vitamin E, or both.  Hormone replacement therapy that contains estrogen with or without progestin.  Manage other health conditions such as hypertension and diabetes as directed by your health care provider.  Follow a heart-healthy diet. A dietitian can help to educate you about healthy food options and changes.  Use healthy cooking methods such as roasting, grilling, broiling, baking, poaching, steaming, or stir-frying. Talk to a dietitian to learn more about healthy cooking methods.  Follow an exercise program approved by your health care provider.  Maintain a healthy weight. Lose weight as approved by your health care provider.  Plan rest periods when fatigued.  Learn to manage stress.  Do not use any tobacco products, including cigarettes, chewing tobacco, or electronic cigarettes. If you need help quitting, ask your health care provider.  If you drink alcohol, and your health care provider approves,  limit your alcohol intake to no more than 1 drink per day. One drink equals 12 ounces of beer, 5 ounces of wine, or 1 ounces of hard liquor.  Stop illegal drug use.  Keep all follow-up visits as directed by your health care provider. This is important. SEEK IMMEDIATE MEDICAL CARE IF:   You have pain in your chest, neck, arm, jaw, stomach, or back that lasts more than a few minutes, is recurring, or is unrelieved by taking sublingualnitroglycerin.  You have profuse sweating without cause.  You have unexplained:  Heartburn or indigestion.  Shortness of breath or difficulty breathing.  Nausea or vomiting.  Fatigue.  Feelings of nervousness or anxiety.  Weakness.  Diarrhea.  You have sudden light-headedness or dizziness.  You faint. These symptoms may represent a serious problem  that is an emergency. Do not wait to see if the symptoms will go away. Get medical help right away. Call your local emergency services (911 in the [place] Do not drive yourself to the hospital.   This information is not intended to replace advice given to you by your health care provider. Make sure you discuss any questions you have with your health care provider.   Document Released: 03/24/2005 Document Revised: 04/14/2014 Document Reviewed: 07/26/2013 Elsevier Interactive Patient Education [employer].

## 2015-12-24 NOTE — Interval H&P Note (Signed)
Cath Lab Visit (complete for each Cath Lab visit)  Clinical Evaluation Leading to the Procedure:   ACS: Yes.    Non-ACS:    Anginal Classification: CCS IV  Anti-ischemic medical therapy: Maximal Therapy (2 or more classes of medications)  Non-Invasive Test Results: No non-invasive testing performed  Prior CABG: No previous CABG      History and Physical Interval Note:  12/24/2015 9:00 AM  Dorothy Harris  has presented today for surgery, with the diagnosis of unstable angina  The various methods of treatment have been discussed with the patient and family. After consideration of risks, benefits and other options for treatment, the patient has consented to  Procedure(s): Left Heart Cath and Coronary Angiography (N/A) as a surgical intervention .  The patient's history has been reviewed, patient examined, no change in status, stable for surgery.  I have reviewed the patient's chart and labs.  Questions were answered to the patient's satisfaction.     Dorothy Harris

## 2015-12-24 NOTE — Care Management Note (Addendum)
Case Management Note  Patient Details  Name: Dorothy Harris MRN: [id number] Date of Birth: 26-Nov-1960  Subjective/Objective:  Pt presented for chest pain- Nstemi. Pt is from [facility] without PCP. Pt works and does not know if she has insurance. Per pt she had insurance in the past and not sure if up to date.                    Action/Plan: CM did discuss hospital f/u and per pt she is willing to have a Hospital f/u scheduled. Appointment placed on the AVS. Pt is aware that [facility] has a $4.00 medication list. Pt will need all generic medications. CM will continue to monitor for additional needs.   Expected Discharge Date:                  Expected Discharge Plan:  Home/Self Care  In-House Referral:  NA  Discharge planning Services  CM Consult, Follow-up appt scheduled, [facility] Choice:  NA Choice offered to:  NA  DME Arranged:  N/A DME Agency:  NA  HH Arranged:  NA HH Agency:  NA  Status of Service:  Completed, signed off  If discussed at [place] of Stay Meetings, dates discussed:    Additional Comments: Pt will go to [place] in [place] and Lipitor price is $14.30 along with norvasc 11.88. Pt will be able to afford medications. No further needs from CM at this time. [id number] 12-24-15 Dorothy Roys, RN 12/24/2015, 2:25 PM

## 2015-12-25 NOTE — Discharge Summary (Signed)
Dorothy Harris, Dorothy Harris                 ACCOUNT NO.:  [id number]  MEDICAL RECORD NO.:  [id number]  LOCATION:  [id number]                        FACILITY:  [facility]  PHYSICIAN:  Deseri Loss N. Terrence Dupont, M.D. DATE OF BIRTH:  10-09-1960  DATE OF ADMISSION:  12/23/2015 DATE OF DISCHARGE:  12/24/2015                              DISCHARGE SUMMARY   ADMITTING DIAGNOSES: 1. Acute coronary syndrome. 2. Uncontrolled hypertension. 3. Elevated blood sugar, rule out diabetes mellitus. 4. Tobacco abuse. 5. Morbid obesity. 6. Gastroesophageal reflux disease.  FINAL DIAGNOSES: 1. Acute coronary syndrome, status post left cardiac catheterization,     noted to have nonobstructive coronary artery disease. 2. Hypertension. 3. Prediabetes. 4. Tobacco abuse. 5. Morbid obesity. 6. Hyperlipidemia. 7. Gastroesophageal reflux disease.  DISCHARGE HOME MEDICATIONS: 1. Aspirin 81 mg 1 tablet daily. 2. Atorvastatin 20 mg daily. 3. Lisinopril 20 mg twice daily. 4. Metoprolol tartrate 12.5 mg twice daily. 5. Nitrostat sublingual p.r.n. 6. Amlodipine 5 mg daily. 7. Pantoprazole 40 mg daily. 8. Tylenol 500 mg every 6 hours as needed. 9. The patient has been advised to stop ibuprofen.  DIET:  Low-salt, low-cholesterol, 1800-calorie ADA diet.  Monitor blood pressure and blood sugar daily and chart.  DISCHARGE INSTRUCTIONS:  Post-cardiac cath instructions have been given. Follow up with me in 1 week.  CONDITION AT DISCHARGE:  Stable.  BRIEF HISTORY AND HOSPITAL COURSE:  Dorothy Harris is a 55 year old female with past medical history significant for hypertension, tobacco abuse, GERD, morbid obesity.  She was transferred from [facility]. The patient complained of retrosternal chest pain, described as tightness while washing dishes, associated with nausea, lasting more than 30 minutes.  The patient received IV heparin, nitro in the ED with relief of chest pain.  EKG done in the ER showed normal sinus rhythm with LVH  with strain pattern versus lateral ischemia.  While waiting in the ED, the patient again developed retrosternal chest pain.  Received increasing IV nitro.  Repeat EKG showed further T-wave inversion in lateral leads as compared to prior EKG.  The patient was noted to have minimally elevated troponin-I.  The patient denies any exertional chest pain, but does give a history of exertional dyspnea.  Denies palpitations, lightheadedness, or syncope.  Denies PND, orthopnea, or leg swelling.  PHYSICAL EXAMINATION:  GENERAL:  She was alert, awake, oriented x3. VITAL SIGNS:  Blood pressure was 152/109, pulse 69.  She was afebrile. EYES:  Conjunctivae were pink. NECK:  Supple.  No JVD.  No bruit. LUNGS:  Clear to auscultation without rhonchi or rales. CARDIOVASCULAR:  S1 and S2 were normal.  There were soft systolic murmur and S4 gallop. ABDOMEN:  Soft.  Bowel sounds are present.  Nontender. EXTREMITIES:  There was no clubbing, cyanosis, or edema.  LABORATORY DATA:  Sodium was 137, potassium 3.9, BUN 11, creatinine 0.88, glucose was 115.  Troponin-Is were 0.06, 0.06, and 0.06, which were minimally elevated.  Total cholesterol was normal at 157, triglycerides were mildly elevated at 165, HDL 40, LDL 84.  Hemoglobin was 11.8, hematocrit 37.1, white count of 10.2.  Her hemoglobin A1c was 6.1.  TSH was 0.949.  BRIEF HOSPITAL COURSE:  The patient was admitted  to telemetry unit.  The patient ruled in for probable very small non-Q-wave myocardial infarction due to prolonged chest pain and minimally elevated troponin-I and minor EKG changes.  The patient subsequently underwent left cardiac cath, noted to have nonobstructive CAD.  The patient tolerated the procedure well.  There were no complications.  Postprocedure, the patient did not have any episodes of chest pain.  Her groin is stable with no evidence of hematoma or bruit.  The patient will be discharged home later today.     Allegra Lai.  Terrence Dupont, M.D.     MNH/MEDQ  D:  12/24/2015  T:  12/25/2015  Job:  [id number]

## 2017-05-31 ENCOUNTER — Inpatient Hospital Stay (HOSPITAL_COMMUNITY): Payer: BLUE CROSS/BLUE SHIELD

## 2017-05-31 ENCOUNTER — Encounter (HOSPITAL_COMMUNITY): Payer: Self-pay | Admitting: Emergency Medicine

## 2017-05-31 ENCOUNTER — Encounter (HOSPITAL_COMMUNITY): Payer: Self-pay

## 2017-05-31 ENCOUNTER — Inpatient Hospital Stay (HOSPITAL_COMMUNITY)
Admission: EM | Admit: 2017-05-31 | Discharge: 2017-06-15 | DRG: 299 | Disposition: A | Discharge status: Home Health | Payer: BLUE CROSS/BLUE SHIELD | Attending: Internal Medicine | Admitting: Internal Medicine

## 2017-05-31 ENCOUNTER — Other Ambulatory Visit: Payer: Self-pay

## 2017-05-31 ENCOUNTER — Emergency Department (HOSPITAL_COMMUNITY): Payer: BLUE CROSS/BLUE SHIELD

## 2017-05-31 DIAGNOSIS — I169 Hypertensive crisis, unspecified: Secondary | ICD-10-CM | POA: Diagnosis not present

## 2017-05-31 DIAGNOSIS — I7101 Dissection of thoracic aorta: Secondary | ICD-10-CM | POA: Diagnosis present

## 2017-05-31 DIAGNOSIS — I361 Nonrheumatic tricuspid (valve) insufficiency: Secondary | ICD-10-CM

## 2017-05-31 DIAGNOSIS — I5033 Acute on chronic diastolic (congestive) heart failure: Secondary | ICD-10-CM

## 2017-05-31 DIAGNOSIS — Z7982 Long term (current) use of aspirin: Secondary | ICD-10-CM | POA: Diagnosis not present

## 2017-05-31 DIAGNOSIS — Z23 Encounter for immunization: Secondary | ICD-10-CM

## 2017-05-31 DIAGNOSIS — I10 Essential (primary) hypertension: Secondary | ICD-10-CM | POA: Diagnosis present

## 2017-05-31 DIAGNOSIS — I2583 Coronary atherosclerosis due to lipid rich plaque: Secondary | ICD-10-CM | POA: Diagnosis present

## 2017-05-31 DIAGNOSIS — N179 Acute kidney failure, unspecified: Secondary | ICD-10-CM | POA: Diagnosis not present

## 2017-05-31 DIAGNOSIS — I5031 Acute diastolic (congestive) heart failure: Secondary | ICD-10-CM | POA: Diagnosis present

## 2017-05-31 DIAGNOSIS — F1010 Alcohol abuse, uncomplicated: Secondary | ICD-10-CM | POA: Diagnosis present

## 2017-05-31 DIAGNOSIS — I951 Orthostatic hypotension: Secondary | ICD-10-CM | POA: Diagnosis not present

## 2017-05-31 DIAGNOSIS — I110 Hypertensive heart disease with heart failure: Secondary | ICD-10-CM | POA: Diagnosis present

## 2017-05-31 DIAGNOSIS — D649 Anemia, unspecified: Secondary | ICD-10-CM | POA: Diagnosis not present

## 2017-05-31 DIAGNOSIS — F419 Anxiety disorder, unspecified: Secondary | ICD-10-CM | POA: Diagnosis not present

## 2017-05-31 DIAGNOSIS — I161 Hypertensive emergency: Secondary | ICD-10-CM | POA: Diagnosis present

## 2017-05-31 DIAGNOSIS — J9690 Respiratory failure, unspecified, unspecified whether with hypoxia or hypercapnia: Secondary | ICD-10-CM

## 2017-05-31 DIAGNOSIS — F17210 Nicotine dependence, cigarettes, uncomplicated: Secondary | ICD-10-CM | POA: Diagnosis present

## 2017-05-31 DIAGNOSIS — R001 Bradycardia, unspecified: Secondary | ICD-10-CM | POA: Diagnosis not present

## 2017-05-31 DIAGNOSIS — I7102 Dissection of abdominal aorta: Secondary | ICD-10-CM | POA: Diagnosis not present

## 2017-05-31 DIAGNOSIS — J1000 Influenza due to other identified influenza virus with unspecified type of pneumonia: Secondary | ICD-10-CM | POA: Diagnosis present

## 2017-05-31 DIAGNOSIS — R079 Chest pain, unspecified: Secondary | ICD-10-CM | POA: Diagnosis present

## 2017-05-31 DIAGNOSIS — IMO0001 Reserved for inherently not codable concepts without codable children: Secondary | ICD-10-CM

## 2017-05-31 DIAGNOSIS — J1100 Influenza due to unidentified influenza virus with unspecified type of pneumonia: Secondary | ICD-10-CM | POA: Diagnosis not present

## 2017-05-31 DIAGNOSIS — N141 Nephropathy induced by other drugs, medicaments and biological substances: Secondary | ICD-10-CM | POA: Diagnosis not present

## 2017-05-31 DIAGNOSIS — I160 Hypertensive urgency: Secondary | ICD-10-CM | POA: Diagnosis not present

## 2017-05-31 DIAGNOSIS — R0602 Shortness of breath: Secondary | ICD-10-CM

## 2017-05-31 DIAGNOSIS — G9341 Metabolic encephalopathy: Secondary | ICD-10-CM | POA: Diagnosis present

## 2017-05-31 DIAGNOSIS — E876 Hypokalemia: Secondary | ICD-10-CM | POA: Diagnosis not present

## 2017-05-31 DIAGNOSIS — R9389 Abnormal findings on diagnostic imaging of other specified body structures: Secondary | ICD-10-CM

## 2017-05-31 DIAGNOSIS — I701 Atherosclerosis of renal artery: Secondary | ICD-10-CM

## 2017-05-31 DIAGNOSIS — T508X5A Adverse effect of diagnostic agents, initial encounter: Secondary | ICD-10-CM | POA: Diagnosis not present

## 2017-05-31 DIAGNOSIS — I509 Heart failure, unspecified: Secondary | ICD-10-CM

## 2017-05-31 DIAGNOSIS — Z79899 Other long term (current) drug therapy: Secondary | ICD-10-CM

## 2017-05-31 DIAGNOSIS — I7776 Dissection of artery of upper extremity: Secondary | ICD-10-CM | POA: Diagnosis present

## 2017-05-31 DIAGNOSIS — I2510 Atherosclerotic heart disease of native coronary artery without angina pectoris: Secondary | ICD-10-CM | POA: Diagnosis not present

## 2017-05-31 DIAGNOSIS — I71019 Dissection of thoracic aorta, unspecified: Secondary | ICD-10-CM | POA: Diagnosis present

## 2017-05-31 DIAGNOSIS — I248 Other forms of acute ischemic heart disease: Secondary | ICD-10-CM | POA: Diagnosis not present

## 2017-05-31 DIAGNOSIS — I71012 Dissection of descending thoracic aorta: Secondary | ICD-10-CM | POA: Diagnosis present

## 2017-05-31 DIAGNOSIS — R109 Unspecified abdominal pain: Secondary | ICD-10-CM

## 2017-05-31 LAB — CBC WITH DIFFERENTIAL/PLATELET
BAND NEUTROPHILS: 3 %
Basophils Absolute: 0 10*3/uL (ref 0.0–0.1)
Basophils Relative: 0 %
EOS ABS: 0 10*3/uL (ref 0.0–0.7)
Eosinophils Relative: 0 %
HCT: 37.9 % (ref 36.0–46.0)
HEMOGLOBIN: 12.2 g/dL (ref 12.0–15.0)
LYMPHS PCT: 1 %
Lymphs Abs: 0.1 10*3/uL — ABNORMAL LOW (ref 0.7–4.0)
MCH: 31.3 pg (ref 26.0–34.0)
MCHC: 32.2 g/dL (ref 30.0–36.0)
MCV: 97.2 fL (ref 78.0–100.0)
MONO ABS: 0.9 10*3/uL (ref 0.1–1.0)
MONOS PCT: 7 %
Neutro Abs: 11.5 10*3/uL — ABNORMAL HIGH (ref 1.7–7.7)
Neutrophils Relative %: 89 %
PLATELETS: 176 10*3/uL (ref 150–400)
RBC: 3.9 MIL/uL (ref 3.87–5.11)
RDW: 16 % — ABNORMAL HIGH (ref 11.5–15.5)
WBC: 12.5 10*3/uL — ABNORMAL HIGH (ref 4.0–10.5)

## 2017-05-31 LAB — RESPIRATORY PANEL BY PCR
Adenovirus: NOT DETECTED
Bordetella pertussis: NOT DETECTED
Chlamydophila pneumoniae: NOT DETECTED
Coronavirus 229E: NOT DETECTED
Coronavirus HKU1: NOT DETECTED
Coronavirus NL63: NOT DETECTED
Coronavirus OC43: NOT DETECTED
Influenza A H1 2009: DETECTED — AB
Influenza B: NOT DETECTED
Metapneumovirus: NOT DETECTED
Mycoplasma pneumoniae: NOT DETECTED
Parainfluenza Virus 1: NOT DETECTED
Parainfluenza Virus 2: NOT DETECTED
Parainfluenza Virus 3: NOT DETECTED
Parainfluenza Virus 4: NOT DETECTED
Respiratory Syncytial Virus: NOT DETECTED
Rhinovirus / Enterovirus: NOT DETECTED

## 2017-05-31 LAB — URINALYSIS, ROUTINE W REFLEX MICROSCOPIC
Bacteria, UA: NONE SEEN
Bilirubin Urine: NEGATIVE
GLUCOSE, UA: NEGATIVE mg/dL
HGB URINE DIPSTICK: NEGATIVE
KETONES UR: NEGATIVE mg/dL
LEUKOCYTES UA: NEGATIVE
Nitrite: NEGATIVE
PH: 5 (ref 5.0–8.0)
Protein, ur: 30 mg/dL — AB
SQUAMOUS EPITHELIAL / LPF: NONE SEEN
Specific Gravity, Urine: 1.024 (ref 1.005–1.030)

## 2017-05-31 LAB — CBC
HCT: 41.2 % (ref 36.0–46.0)
Hemoglobin: 13.3 g/dL (ref 12.0–15.0)
MCH: 31.2 pg (ref 26.0–34.0)
MCHC: 32.3 g/dL (ref 30.0–36.0)
MCV: 96.7 fL (ref 78.0–100.0)
Platelets: 190 10*3/uL (ref 150–400)
RBC: 4.26 MIL/uL (ref 3.87–5.11)
RDW: 15.4 % (ref 11.5–15.5)
WBC: 8.7 10*3/uL (ref 4.0–10.5)

## 2017-05-31 LAB — MRSA PCR SCREENING: MRSA BY PCR: NEGATIVE

## 2017-05-31 LAB — COMPREHENSIVE METABOLIC PANEL
ALBUMIN: 4.4 g/dL (ref 3.5–5.0)
ALK PHOS: 99 U/L (ref 38–126)
ALT: 48 U/L (ref 14–54)
AST: 36 U/L (ref 15–41)
Anion gap: 12 (ref 5–15)
BUN: 16 mg/dL (ref 6–20)
CALCIUM: 9.3 mg/dL (ref 8.9–10.3)
CHLORIDE: 101 mmol/L (ref 101–111)
CO2: 27 mmol/L (ref 22–32)
CREATININE: 1.22 mg/dL — AB (ref 0.44–1.00)
GFR calc Af Amer: 56 mL/min — ABNORMAL LOW (ref >60)
GFR calc non Af Amer: 49 mL/min — ABNORMAL LOW (ref >60)
GLUCOSE: 137 mg/dL — AB (ref 65–99)
Potassium: 3.6 mmol/L (ref 3.5–5.1)
Sodium: 140 mmol/L (ref 135–145)
Total Bilirubin: 1.5 mg/dL — ABNORMAL HIGH (ref 0.3–1.2)
Total Protein: 7.9 g/dL (ref 6.5–8.1)

## 2017-05-31 LAB — GLUCOSE, CAPILLARY: Glucose-Capillary: 143 mg/dL — ABNORMAL HIGH (ref 65–99)

## 2017-05-31 LAB — I-STAT TROPONIN, ED: TROPONIN I, POC: 0.02 ng/mL (ref 0.00–0.08)

## 2017-05-31 LAB — BRAIN NATRIURETIC PEPTIDE: B Natriuretic Peptide: 3012 pg/mL — ABNORMAL HIGH (ref 0.0–100.0)

## 2017-05-31 LAB — PROCALCITONIN: PROCALCITONIN: 2.1 ng/mL

## 2017-05-31 MED ORDER — IOPAMIDOL (ISOVUE-370) INJECTION 76%
INTRAVENOUS | Status: AC
  Administered 2017-05-31: 50 mL
  Filled 2017-05-31: qty 50

## 2017-05-31 MED ORDER — ONDANSETRON HCL 4 MG/2ML IJ SOLN
4.0000 mg | Freq: Four times a day (QID) | INTRAMUSCULAR | Status: DC | PRN
  Administered 2017-06-03: 4 mg via INTRAVENOUS
  Filled 2017-05-31: qty 2

## 2017-05-31 MED ORDER — METOPROLOL TARTRATE 5 MG/5ML IV SOLN
5.0000 mg | Freq: Four times a day (QID) | INTRAVENOUS | Status: DC
  Administered 2017-05-31 – 2017-06-01 (×4): 5 mg via INTRAVENOUS
  Filled 2017-05-31 (×4): qty 5

## 2017-05-31 MED ORDER — PNEUMOCOCCAL VAC POLYVALENT 25 MCG/0.5ML IJ INJ
0.5000 mL | INJECTION | INTRAMUSCULAR | Status: AC
  Administered 2017-06-02: 0.5 mL via INTRAMUSCULAR
  Filled 2017-05-31: qty 0.5

## 2017-05-31 MED ORDER — THIAMINE HCL 100 MG/ML IJ SOLN
100.0000 mg | Freq: Every day | INTRAMUSCULAR | Status: DC
  Administered 2017-05-31 – 2017-06-03 (×4): 100 mg via INTRAVENOUS
  Filled 2017-05-31 (×4): qty 2

## 2017-05-31 MED ORDER — NICARDIPINE HCL IN NACL 20-0.86 MG/200ML-% IV SOLN
INTRAVENOUS | Status: AC
  Administered 2017-05-31: 5 mg/h via INTRAVENOUS
  Filled 2017-05-31: qty 200

## 2017-05-31 MED ORDER — DEXMEDETOMIDINE HCL IN NACL 200 MCG/50ML IV SOLN
0.4000 ug/kg/h | INTRAVENOUS | Status: DC
  Administered 2017-05-31 (×2): 0.4 ug/kg/h via INTRAVENOUS
  Filled 2017-05-31 (×2): qty 50

## 2017-05-31 MED ORDER — HYDRALAZINE HCL 20 MG/ML IJ SOLN
5.0000 mg | Freq: Once | INTRAMUSCULAR | Status: AC
  Administered 2017-05-31: 5 mg via INTRAVENOUS
  Filled 2017-05-31: qty 1

## 2017-05-31 MED ORDER — MORPHINE SULFATE (PF) 4 MG/ML IV SOLN
4.0000 mg | Freq: Once | INTRAVENOUS | Status: AC
  Administered 2017-05-31: 4 mg via INTRAVENOUS
  Filled 2017-05-31: qty 1

## 2017-05-31 MED ORDER — ACETAMINOPHEN 650 MG RE SUPP
650.0000 mg | RECTAL | Status: DC | PRN
  Administered 2017-05-31: 650 mg via RECTAL
  Filled 2017-05-31 (×2): qty 1

## 2017-05-31 MED ORDER — ORAL CARE MOUTH RINSE
15.0000 mL | Freq: Two times a day (BID) | OROMUCOSAL | Status: DC
  Administered 2017-05-31 – 2017-06-11 (×6): 15 mL via OROMUCOSAL

## 2017-05-31 MED ORDER — IOPAMIDOL (ISOVUE-370) INJECTION 76%
100.0000 mL | Freq: Once | INTRAVENOUS | Status: AC | PRN
  Administered 2017-05-31: 100 mL via INTRAVENOUS

## 2017-05-31 MED ORDER — HEPARIN SODIUM (PORCINE) 5000 UNIT/ML IJ SOLN
5000.0000 [IU] | Freq: Three times a day (TID) | INTRAMUSCULAR | Status: DC
  Administered 2017-05-31 – 2017-06-15 (×44): 5000 [IU] via SUBCUTANEOUS
  Filled 2017-05-31 (×44): qty 1

## 2017-05-31 MED ORDER — NITROGLYCERIN IN D5W 200-5 MCG/ML-% IV SOLN
5.0000 ug/min | INTRAVENOUS | Status: DC
  Administered 2017-05-31: 5 ug/min via INTRAVENOUS
  Filled 2017-05-31: qty 250

## 2017-05-31 MED ORDER — MORPHINE SULFATE (PF) 4 MG/ML IV SOLN
4.0000 mg | INTRAVENOUS | Status: DC | PRN
  Filled 2017-05-31: qty 1

## 2017-05-31 MED ORDER — FUROSEMIDE 10 MG/ML IJ SOLN
40.0000 mg | Freq: Two times a day (BID) | INTRAMUSCULAR | Status: DC
  Administered 2017-05-31 – 2017-06-02 (×4): 40 mg via INTRAVENOUS
  Filled 2017-05-31 (×4): qty 4

## 2017-05-31 MED ORDER — NICARDIPINE HCL IN NACL 20-0.86 MG/200ML-% IV SOLN
3.0000 mg/h | Freq: Once | INTRAVENOUS | Status: AC
  Administered 2017-05-31: 5 mg/h via INTRAVENOUS

## 2017-05-31 MED ORDER — SODIUM CHLORIDE 0.9 % IV SOLN
1.0000 g | INTRAVENOUS | Status: DC
  Administered 2017-05-31 – 2017-06-08 (×9): 1 g via INTRAVENOUS
  Filled 2017-05-31 (×9): qty 10

## 2017-05-31 MED ORDER — MORPHINE SULFATE (PF) 4 MG/ML IV SOLN
INTRAVENOUS | Status: AC
  Administered 2017-05-31: 4 mg
  Filled 2017-05-31: qty 1

## 2017-05-31 MED ORDER — DEXTROSE IN LACTATED RINGERS 5 % IV SOLN
INTRAVENOUS | Status: DC
  Filled 2017-05-31: qty 1000

## 2017-05-31 MED ORDER — ONDANSETRON HCL 4 MG/2ML IJ SOLN
4.0000 mg | Freq: Once | INTRAMUSCULAR | Status: AC
Start: 2017-05-31 — End: 2017-05-31
  Administered 2017-05-31: 4 mg via INTRAVENOUS
  Filled 2017-05-31: qty 2

## 2017-05-31 MED ORDER — METOPROLOL TARTRATE 5 MG/5ML IV SOLN
5.0000 mg | Freq: Once | INTRAVENOUS | Status: AC
  Administered 2017-05-31: 5 mg via INTRAVENOUS
  Filled 2017-05-31: qty 5

## 2017-05-31 MED ORDER — SODIUM CHLORIDE 0.9 % IV SOLN: 250.0000 mL | INTRAVENOUS | Status: DC | PRN

## 2017-05-31 MED ORDER — INFLUENZA VAC SPLIT QUAD 0.5 ML IM SUSY
0.5000 mL | PREFILLED_SYRINGE | INTRAMUSCULAR | Status: DC
  Filled 2017-05-31: qty 0.5

## 2017-05-31 MED ORDER — NICARDIPINE HCL IN NACL 20-0.86 MG/200ML-% IV SOLN
INTRAVENOUS | Status: AC
  Filled 2017-05-31: qty 200

## 2017-05-31 MED ORDER — NICARDIPINE HCL IN NACL 40-0.83 MG/200ML-% IV SOLN
3.0000 mg/h | INTRAVENOUS | Status: DC
  Administered 2017-05-31 – 2017-06-02 (×5): 3 mg/h via INTRAVENOUS
  Filled 2017-05-31 (×5): qty 200

## 2017-05-31 MED ORDER — NICARDIPINE HCL IN NACL 20-0.86 MG/200ML-% IV SOLN
3.0000 mg/h | INTRAVENOUS | Status: DC
  Administered 2017-05-31 (×2): 15 mg/h via INTRAVENOUS
  Filled 2017-05-31 (×4): qty 200

## 2017-05-31 MED ORDER — FUROSEMIDE 10 MG/ML IJ SOLN
40.0000 mg | Freq: Once | INTRAMUSCULAR | Status: AC
  Administered 2017-05-31: 40 mg via INTRAVENOUS
  Filled 2017-05-31: qty 4

## 2017-05-31 MED ORDER — NICARDIPINE HCL IN NACL 20-0.86 MG/200ML-% IV SOLN
3.0000 mg/h | INTRAVENOUS | Status: AC
  Administered 2017-05-31: 5 mg/h via INTRAVENOUS
  Filled 2017-05-31: qty 200

## 2017-05-31 MED ORDER — AZITHROMYCIN 500 MG IV SOLR
500.0000 mg | INTRAVENOUS | Status: DC
  Administered 2017-05-31 – 2017-06-03 (×4): 500 mg via INTRAVENOUS
  Filled 2017-05-31 (×7): qty 500
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
## 2017-05-31 NOTE — ED Notes (Signed)
Pt beginning to have mental status changes.  Is becoming confused at times and forgetful.  Initially asked for bedpan and then nodded off while using it.  When asked if she was ok, pt stated yes but had forgotten what she was doing.  Will continue to monitor.

## 2017-05-31 NOTE — ED Notes (Signed)
Report given to [facility] on [facility] at [facility].

## 2017-05-31 NOTE — ED Notes (Signed)
Pt continues to be much less alert than on arrival.  EDP notified.  No new orders.

## 2017-05-31 NOTE — ED Triage Notes (Signed)
Pt with central chest pain that started early yesterday morning. Pt also c/o abdominal pain and nausea.

## 2017-05-31 NOTE — Progress Notes (Signed)
Bladder scan shows >846mL urine. I&O cath performed with difficulty d/t lack of pt cooperation and agitation. 747mL urine returned. UDS and UA sent.
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
## 2017-05-31 NOTE — ED Notes (Addendum)
BP taken in both arms to verify accurate, consistent in both, BP reported to EDP, Tomi Bamberger

## 2017-05-31 NOTE — Progress Notes (Signed)
  Echocardiogram 2D Echocardiogram has been performed.  Dorothy Harris 05/31/2017, 4:32 PM

## 2017-05-31 NOTE — ED Notes (Signed)
Pt O2 @ 83%, placed on 2L [place]

## 2017-05-31 NOTE — ED Notes (Signed)
Dr. Roderic Palau assessed pt for mental status change.  Thinks pt is just tired and instructed to monitor.
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
## 2017-05-31 NOTE — Progress Notes (Signed)
RT Note: An ABG was obtained on the patient from her right radial artery. Upon arriving to her room, she was wearing a Venti Mask at 55% but the flow meter was only set at 10 lpm so she was not receiving the full 55% O2 but 45% instead. The ABG was obtained on 45% O2 via venti mask. Post ABG the flow meter was increased to 14L so that she can receive 55% O2. Rt will continue to monitor.
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
## 2017-05-31 NOTE — ED Notes (Signed)
Pt continues to be slow to respond to questions, but will answer appropriately.  No focal neuro deficit noted.

## 2017-05-31 NOTE — Progress Notes (Signed)
RT Note: ABG was accidentally entered in the istat as being obtained from the Femoral Artery. That is incorrect. The blood was obtained via the Right Radial Artery. [facility] lab will be contacted to fix it in Epic.

## 2017-06-01 ENCOUNTER — Inpatient Hospital Stay (HOSPITAL_COMMUNITY): Payer: BLUE CROSS/BLUE SHIELD

## 2017-06-01 DIAGNOSIS — J1100 Influenza due to unidentified influenza virus with unspecified type of pneumonia: Secondary | ICD-10-CM

## 2017-06-01 DIAGNOSIS — IMO0001 Reserved for inherently not codable concepts without codable children: Secondary | ICD-10-CM

## 2017-06-01 DIAGNOSIS — I169 Hypertensive crisis, unspecified: Secondary | ICD-10-CM

## 2017-06-01 DIAGNOSIS — I160 Hypertensive urgency: Secondary | ICD-10-CM

## 2017-06-01 DIAGNOSIS — I7101 Dissection of thoracic aorta: Secondary | ICD-10-CM

## 2017-06-01 LAB — GLUCOSE, CAPILLARY: Glucose-Capillary: 112 mg/dL — ABNORMAL HIGH (ref 65–99)

## 2017-06-01 LAB — VAS US CAROTID
LCCADDIAS: -21 cm/s
LCCADSYS: -75 cm/s
LCCAPSYS: 78 cm/s
LEFT ECA DIAS: -22 cm/s
LEFT VERTEBRAL DIAS: 16 cm/s
LICADDIAS: -25 cm/s
LICADSYS: -67 cm/s
LICAPSYS: -41 cm/s
Left CCA prox dias: 14 cm/s
Left ICA prox dias: -17 cm/s
RCCADSYS: -72 cm/s
RCCAPSYS: 161 cm/s
RIGHT ECA DIAS: -18 cm/s
RIGHT VERTEBRAL DIAS: 15 cm/s
Right CCA prox dias: 26 cm/s

## 2017-06-01 LAB — CBC
HEMATOCRIT: 36.5 % (ref 36.0–46.0)
Hemoglobin: 11.5 g/dL — ABNORMAL LOW (ref 12.0–15.0)
MCH: 31 pg (ref 26.0–34.0)
MCHC: 31.5 g/dL (ref 30.0–36.0)
MCV: 98.4 fL (ref 78.0–100.0)
Platelets: 154 10*3/uL (ref 150–400)
RBC: 3.71 MIL/uL — ABNORMAL LOW (ref 3.87–5.11)
RDW: 16.1 % — AB (ref 11.5–15.5)
WBC: 10 10*3/uL (ref 4.0–10.5)

## 2017-06-01 LAB — BASIC METABOLIC PANEL
Anion gap: 15 (ref 5–15)
BUN: 24 mg/dL — ABNORMAL HIGH (ref 6–20)
CHLORIDE: 102 mmol/L (ref 101–111)
CO2: 23 mmol/L (ref 22–32)
Calcium: 8.1 mg/dL — ABNORMAL LOW (ref 8.9–10.3)
Creatinine, Ser: 2.09 mg/dL — ABNORMAL HIGH (ref 0.44–1.00)
GFR calc Af Amer: 29 mL/min — ABNORMAL LOW (ref >60)
GFR calc non Af Amer: 25 mL/min — ABNORMAL LOW (ref >60)
GLUCOSE: 94 mg/dL (ref 65–99)
POTASSIUM: 3.7 mmol/L (ref 3.5–5.1)
Sodium: 140 mmol/L (ref 135–145)

## 2017-06-01 LAB — ECHOCARDIOGRAM COMPLETE
Height: 67 in
WEIGHTICAEL: 2560 [oz_av]

## 2017-06-01 LAB — HIV ANTIBODY (ROUTINE TESTING W REFLEX): HIV Screen 4th Generation wRfx: NONREACTIVE

## 2017-06-01 LAB — BRAIN NATRIURETIC PEPTIDE: B NATRIURETIC PEPTIDE 5: 1459.3 pg/mL — AB (ref 0.0–100.0)

## 2017-06-01 LAB — PROCALCITONIN: PROCALCITONIN: 5.42 ng/mL

## 2017-06-01 MED ORDER — OSELTAMIVIR PHOSPHATE 30 MG PO CAPS
30.0000 mg | ORAL_CAPSULE | Freq: Two times a day (BID) | ORAL | Status: AC
  Administered 2017-06-01 – 2017-06-05 (×10): 30 mg via ORAL
  Filled 2017-06-01 (×13): qty 1

## 2017-06-01 MED ORDER — ALBUTEROL SULFATE (2.5 MG/3ML) 0.083% IN NEBU
2.5000 mg | INHALATION_SOLUTION | RESPIRATORY_TRACT | Status: DC | PRN
  Administered 2017-06-01 – 2017-06-07 (×2): 2.5 mg via RESPIRATORY_TRACT
  Filled 2017-06-01 (×2): qty 3

## 2017-06-01 MED ORDER — MORPHINE SULFATE (PF) 4 MG/ML IV SOLN
2.0000 mg | INTRAVENOUS | Status: DC | PRN
  Administered 2017-06-03: 2 mg via INTRAVENOUS
  Filled 2017-06-01: qty 1

## 2017-06-01 MED ORDER — ACETAMINOPHEN 325 MG PO TABS
650.0000 mg | ORAL_TABLET | Freq: Four times a day (QID) | ORAL | Status: DC | PRN
  Administered 2017-06-01 – 2017-06-14 (×10): 650 mg via ORAL
  Filled 2017-06-01 (×10): qty 2

## 2017-06-01 MED ORDER — SODIUM CHLORIDE 0.9 % IV SOLN
INTRAVENOUS | Status: DC
  Administered 2017-06-01: 10:00:00 via INTRAVENOUS

## 2017-06-01 MED ORDER — LACTATED RINGERS IV SOLN: INTRAVENOUS | Status: DC

## 2017-06-01 MED ORDER — CARVEDILOL 12.5 MG PO TABS
12.5000 mg | ORAL_TABLET | Freq: Two times a day (BID) | ORAL | Status: DC
  Administered 2017-06-01 – 2017-06-03 (×5): 12.5 mg via ORAL
  Filled 2017-06-01 (×6): qty 1

## 2017-06-01 NOTE — Progress Notes (Signed)
Preliminary results by tech - Carotid Duplex Completed. No evidence of stenosis or obvious dissection in bilateral carotid arteries.  Oda Cogan, BS, RDMS, RVT

## 2017-06-01 NOTE — Progress Notes (Addendum)
  Progress Note    06/01/2017 9:25 AM   Subjective:  Says she has a pain in her stomach when she coughs.  The pain she had that brought her to the hospital improved  Tm 102.6 yesterday morning now 100.9 HR 50's-70's NSR [id number] systolic 88% [id number]  Gtts:  Nicardipine  Vitals:   06/01/17 0800 06/01/17 0900  BP: (!) 141/81 132/80  Pulse: 60 64  Resp: 20 20  Temp:    SpO2: 99% 96%    Physical Exam: Cardiac:  regular Lungs:  Non labored Extremities:  Easily palpable bilateral radial pulses and bilateral DP pulses Abdomen:  Soft, non tender to palpation  CBC    Component Value Date/Time   WBC 10.0 06/01/2017 0226   RBC 3.71 (L) 06/01/2017 0226   HGB 11.5 (L) 06/01/2017 0226   HCT 36.5 06/01/2017 0226   PLT 154 06/01/2017 0226   MCV 98.4 06/01/2017 0226   MCH 31.0 06/01/2017 0226   MCHC 31.5 06/01/2017 0226   RDW 16.1 (H) 06/01/2017 0226   LYMPHSABS 0.1 (L) 05/31/2017 1252   MONOABS 0.9 05/31/2017 1252   EOSABS 0.0 05/31/2017 1252   BASOSABS 0.0 05/31/2017 1252    BMET    Component Value Date/Time   NA 140 06/01/2017 0226   K 3.7 06/01/2017 0226   CL 102 06/01/2017 0226   CO2 23 06/01/2017 0226   GLUCOSE 94 06/01/2017 0226   BUN 24 (H) 06/01/2017 0226   CREATININE 2.09 (H) 06/01/2017 0226   CALCIUM 8.1 (L) 06/01/2017 0226   GFRNONAA 25 (L) 06/01/2017 0226   GFRAA 29 (L) 06/01/2017 0226    INR    Component Value Date/Time   INR 1.04 12/23/2015 1040     Intake/Output Summary (Last 24 hours) at 06/01/2017 0721 Last data filed at 06/01/2017 0900 Gross per 24 hour  Intake 2371.12 ml  Output 2075 ml  Net 296.12 ml     Assessment:  58 y.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 Will need repeat imaging hopefully prior to discharge but will wait until renal function improves. Remain in icu for bp management. Will check carotid doppler studies.   Regan Mcbryar C. Donzetta Matters, MD [facility] [place] Office: [phone] Pager: [phone]

## 2017-06-01 NOTE — Progress Notes (Signed)
Progress Note  Patient Name: Dorothy Harris Date of Encounter: 06/01/2017 Primary Cardiologist: New-Dr. Radford Pax (was [facility])  Subjective   Pt reports no chest pain, breathing improved overnight. BP normalizing with IV BB and nicardipine gtt. Noted confusion and delirium seems to be subsiding this AM. Precedex off.   Inpatient Medications    Scheduled Meds: . furosemide  40 mg Intravenous BID  . heparin  5,000 Units Subcutaneous Q8H  . Influenza vac split quadrivalent PF  0.5 mL Intramuscular Tomorrow-1000  . mouth rinse  15 mL Mouth Rinse BID  . metoprolol tartrate  5 mg Intravenous Q6H  . oseltamivir  30 mg Oral BID  . pneumococcal 23 valent vaccine  0.5 mL Intramuscular Tomorrow-1000  . thiamine injection  100 mg Intravenous Daily   Continuous Infusions: . sodium chloride Stopped (05/31/17 1600)  . azithromycin 500 mg (05/31/17 1653)  . cefTRIAXone (ROCEPHIN)  IV Stopped (05/31/17 1400)  . dexmedetomidine (PRECEDEX) IV infusion Stopped (06/01/17 0800)  . lactated ringers 50 mL/hr at 06/01/17 0800  . niCARDipine 3 mg/hr (06/01/17 0800)   PRN Meds: sodium chloride, acetaminophen, morphine injection, ondansetron (ZOFRAN) IV   Vital Signs    Vitals:   06/01/17 0600 06/01/17 0700 06/01/17 0721 06/01/17 0800  BP: 126/81 124/78  (!) 141/81  Pulse: (!) 59 (!) 58  60  Resp: $Remo'16 18  20  'MOBBJ$ Temp:   (!) 100.9 F (38.3 C)   TempSrc:   Oral   SpO2: 99% 100%  99%  Weight:      Height:        Intake/Output Summary (Last 24 hours) at 06/01/2017 0846 Last data filed at 06/01/2017 0800 Gross per 24 hour  Intake 2318.27 ml  Output 2475 ml  Net -156.73 ml   Filed Weights   05/31/17 0625 06/01/17 0500  Weight: 160 lb (72.6 kg) 192 lb 14.4 oz (87.5 kg)    Physical Exam   General: Well developed, well nourished, NAD Skin: Warm, dry, intact  Head: Normocephalic, atraumatic, clear, moist mucus membranes. Neck: Negative for carotid bruits. No JVD Lungs: + wheezes bilateral  upper lobes, no rales or rhonchi. Breathing is unlabored. Cardiovascular: RRR with S1 S2. No murmurs, rubs, or gallops Abdomen: Soft, non-tender, non-distended with normoactive bowel sounds. No obvious abdominal masses. MSK: Strength and tone appear normal for age. 5/5 in all extremities Extremities: No edema. No clubbing or cyanosis. DP/PT pulses 1+ bilaterally Neuro: Alert and oriented. No focal deficits. No facial asymmetry. MAE spontaneously. Psych: Responds to questions appropriately with normal affect.    Labs    Chemistry Recent Labs  Lab 05/31/17 0635 06/01/17 0226  NA 140 140  K 3.6 3.7  CL 101 102  CO2 27 23  GLUCOSE 137* 94  BUN 16 24*  CREATININE 1.22* 2.09*  CALCIUM 9.3 8.1*  PROT 7.9  --   ALBUMIN 4.4  --   AST 36  --   ALT 48  --   ALKPHOS 99  --   BILITOT 1.5*  --   GFRNONAA 49* 25*  GFRAA 56* 29*  ANIONGAP 12 15     Hematology Recent Labs  Lab 05/31/17 0624 05/31/17 1252 06/01/17 0226  WBC 8.7 12.5* 10.0  RBC 4.26 3.90 3.71*  HGB 13.3 12.2 11.5*  HCT 41.2 37.9 36.5  MCV 96.7 97.2 98.4  MCH 31.2 31.3 31.0  MCHC 32.3 32.2 31.5  RDW 15.4 16.0* 16.1*  PLT 190 176 154    Cardiac EnzymesNo results  for input(s): TROPONINI in the last 168 hours.  Recent Labs  Lab 05/31/17 0635  TROPIPOC 0.02     BNP Recent Labs  Lab 05/31/17 0635  BNP 3,012.0*     DDimer No results for input(s): DDIMER in the last 168 hours.   Radiology    Ct Angio Head W Or Wo Contrast  Result Date: 05/31/2017 CLINICAL DATA:  Known acute aortic dissection. Mental status change. EXAM: CT ANGIOGRAPHY HEAD AND NECK TECHNIQUE: Multidetector CT imaging of the head and neck was performed using the standard protocol during bolus administration of intravenous contrast. Multiplanar CT image reconstructions and MIPs were obtained to evaluate the vascular anatomy. Carotid stenosis measurements (when applicable) are obtained utilizing NASCET criteria, using the distal internal  carotid diameter as the denominator. CONTRAST:  29mL ISOVUE-370 IOPAMIDOL (ISOVUE-370) INJECTION 76% COMPARISON:  CTA of the chest, abdomen, and pelvis from the same day at 8:38 a.m. FINDINGS: CT HEAD FINDINGS Brain: No acute infarct, hemorrhage, No acute infarct, hemorrhage, or mass lesion is present. The ventricles are of normal size. No significant extraaxial fluid collection is present. No significant white matter disease is present. The brainstem and cerebellum are normal. Vascular: No hyperdense vessel or unexpected calcification. Skull: The calvarium is intact. No focal lytic or blastic lesions are present. Sinuses: The paranasal sinuses and mastoid air cells are clear. There is extensive pneumatization of the mastoids bilaterally. Orbits: Globes and orbits are within normal limits. Review of the MIP images confirms the above findings CTA NECK FINDINGS Aortic arch: A 3 vessel arch configuration is present. 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 There is no involvement of the great vessel origins. Right carotid system: The right common carotid artery is mildly tortuous without focal stenosis. Bifurcation is unremarkable. There is mild tortuosity of the cervical right ICA without stenosis. Left carotid system: The left common carotid artery demonstrates moderate tortuosity proximally without significant stenosis. Bifurcation is unremarkable. Cervical left ICA is mildly tortuous. There is no significant stenosis. Vertebral arteries: The vertebral arteries originate from the subclavian arteries bilaterally without significant stenosis. The vertebral arteries are codominant. There is no focal stenosis or vascular injury to either vertebral artery in the neck. Skeleton: Vertebral body heights alignment are maintained. No focal lytic or blastic lesions are present. Facet degenerative changes are most evident at C3-4 and C7-T1 on the left.  There is congenital fusion at C2-3. Other neck: A the soft tissues the neck are otherwise unremarkable. No focal mucosal or submucosal lesions are present. Vocal cords are midline and symmetric. Upper chest: Airspace consolidation is present in the left lower lobe. There is patchy airspace disease throughout the left upper lobe. Mild atelectasis is present on the right. Review of the MIP images confirms the above findings CTA HEAD FINDINGS Anterior circulation: Mild narrowing of the cavernous right internal carotid artery is less than 50%. The right A1 segment is aplastic. Both A2 segments fill from the left. The right M1 segment demonstrates mild irregularity distally without a significant stenosis. The MCA bifurcations are intact. Medium and distal small vessel irregularity is present throughout the ACA and MCA branches without a significant proximal stenosis or occlusion. Posterior circulation: The vertebral arteries are codominant. The basilar artery is normal. Both posterior cerebral arteries originate from the basilar tip. There is some attenuation of distal PCA branch vessels without a significant proximal stenosis or occlusion. Venous sinuses: The dural sinuses are patent. The straight sinus  and deep cerebral veins are intact. Cortical veins are unremarkable. Anatomic variants: None Delayed phase: Unremarkable Review of the MIP images confirms the above findings IMPRESSION: 1. 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 2. Tortuosity of the carotid arteries bilaterally without focal stenosis. This likely reflects the sequelae of chronic hypertension. 3. Medium and distal small vessel atherosclerotic irregularity throughout the circle-of-Willis without a significant proximal stenosis, aneurysm, or branch vessel occlusion. 4. No acute intracranial abnormality. 5. 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  Electronically Signed   By: San Morelle M.D.   On: 05/31/2017 18:20   Ct Angio Neck W Or Wo Contrast  Result Date: 05/31/2017 CLINICAL DATA:  Known acute aortic dissection. Mental status change. EXAM: CT ANGIOGRAPHY HEAD AND NECK TECHNIQUE: Multidetector CT imaging of the head and neck was performed using the standard protocol during bolus administration of intravenous contrast. Multiplanar CT image reconstructions and MIPs were obtained to evaluate the vascular anatomy. Carotid stenosis measurements (when applicable) are obtained utilizing NASCET criteria, using the distal internal carotid diameter as the denominator. CONTRAST:  83mL ISOVUE-370 IOPAMIDOL (ISOVUE-370) INJECTION 76% COMPARISON:  CTA of the chest, abdomen, and pelvis from the same day at 8:38 a.m. FINDINGS: CT HEAD FINDINGS Brain: No acute infarct, hemorrhage, No acute infarct, hemorrhage, or mass lesion is present. The ventricles are of normal size. No significant extraaxial fluid collection is present. No significant white matter disease is present. The brainstem and cerebellum are normal. Vascular: No hyperdense vessel or unexpected calcification. Skull: The calvarium is intact. No focal lytic or blastic lesions are present. Sinuses: The paranasal sinuses and mastoid air cells are clear. There is extensive pneumatization of the mastoids bilaterally. Orbits: Globes and orbits are within normal limits. Review of the MIP images confirms the above findings CTA NECK FINDINGS Aortic arch: A 3 vessel arch configuration is present. 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 There is no involvement of the great vessel origins. Right carotid system: The right common carotid artery is mildly tortuous without focal stenosis. Bifurcation is unremarkable. There is mild tortuosity of the cervical right ICA without stenosis. Left carotid system: The left common carotid artery  demonstrates moderate tortuosity proximally without significant stenosis. Bifurcation is unremarkable. Cervical left ICA is mildly tortuous. There is no significant stenosis. Vertebral arteries: The vertebral arteries originate from the subclavian arteries bilaterally without significant stenosis. The vertebral arteries are codominant. There is no focal stenosis or vascular injury to either vertebral artery in the neck. Skeleton: Vertebral body heights alignment are maintained. No focal lytic or blastic lesions are present. Facet degenerative changes are most evident at C3-4 and C7-T1 on the left. There is congenital fusion at C2-3. Other neck: A the soft tissues the neck are otherwise unremarkable. No focal mucosal or submucosal lesions are present. Vocal cords are midline and symmetric. Upper chest: Airspace consolidation is present in the left lower lobe. There is patchy airspace disease throughout the left upper lobe. Mild atelectasis is present on the right. Review of the MIP images confirms the above findings CTA HEAD FINDINGS Anterior circulation: Mild narrowing of the cavernous right internal carotid artery is less than 50%. The right A1 segment is aplastic. Both A2 segments fill from the left. The right M1 segment demonstrates mild irregularity distally without a significant stenosis. The MCA bifurcations are intact. Medium and distal small vessel irregularity  is present throughout the ACA and MCA branches without a significant proximal stenosis or occlusion. Posterior circulation: The vertebral arteries are codominant. The basilar artery is normal. Both posterior cerebral arteries originate from the basilar tip. There is some attenuation of distal PCA branch vessels without a significant proximal stenosis or occlusion. Venous sinuses: The dural sinuses are patent. The straight sinus and deep cerebral veins are intact. Cortical veins are unremarkable. Anatomic variants: None Delayed phase: Unremarkable  Review of the MIP images confirms the above findings IMPRESSION: 1. 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 2. Tortuosity of the carotid arteries bilaterally without focal stenosis. This likely reflects the sequelae of chronic hypertension. 3. Medium and distal small vessel atherosclerotic irregularity throughout the circle-of-Willis without a significant proximal stenosis, aneurysm, or branch vessel occlusion. 4. No acute intracranial abnormality. 5. 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 Electronically Signed   By: Marin Roberts M.D.   On: 05/31/2017 18:20   Dg Chest Port 1 View  Result Date: 05/31/2017 CLINICAL DATA:  Chest pain.  High blood pressure. EXAM: PORTABLE CHEST 1 VIEW COMPARISON:  December 22, 2015 FINDINGS: The study is limited due to the portable technique with poor penetration. No pneumothorax identified. Opacity in the left retrocardiac region cannot be excluded on this study. Mild haziness over the lateral left lung is probably due to overlapping soft tissues. Lucency along the aortic arch is favored to be artifactual rather than mediastinal air. No other evidence of mediastinal air. IMPRESSION: 1. Limited evaluation due to portable technique. 2. Lucency along the aortic arch is favored to be artifactual rather than mediastinal air. Recommend a PA and lateral chest x-ray for better evaluation. 3. Left retrocardiac opacity cannot be excluded on this study due to technique. This could be better evaluated with the recommended PA and lateral chest x-ray. Electronically Signed   By: Gerome Sam III M.D   On: 05/31/2017 07:44   Ct Angio Chest/abd/pel For Dissection W And/or Wo Contrast  Result Date: 05/31/2017 CLINICAL DATA:  Intermittent chest pain for 2 weeks EXAM: CT ANGIOGRAPHY CHEST, ABDOMEN AND PELVIS TECHNIQUE: Multidetector CT imaging through the chest, abdomen and pelvis was  performed using the standard protocol during bolus administration of intravenous contrast. Multiplanar reconstructed images and MIPs were obtained and reviewed to evaluate the vascular anatomy. CONTRAST:  ISOVUE-370 IOPAMIDOL (ISOVUE-370) INJECTION 76% COMPARISON:  None. FINDINGS: CTA CHEST FINDINGS Cardiovascular: A dissection involving the aortic arch and descending thoracic aorta is present consistent with a Stanford type B dissection. The dissection flap begins just beyond the origin of the left subclavian artery and extends to the mid descending thoracic aorta. The true lumen is the dominant component. The false lumen is somewhat spiral in configuration and primarily enhances. A tiny portion of the false lumen is thrombosed inferiorly. See image 40 of series 5. Great vessels are patent. Specifically, there is no evidence of extension of the dissection flap into the great vessels. Vertebral arteries are also patent. No obvious acute pulmonary thromboembolism. Coronary arteries are grossly unremarkable. Mediastinum/Nodes: Small scattered mediastinal nodes. Unremarkable thyroid and esophagus. Physiologic pericardial fluid. Lungs/Pleura: No pneumothorax.  No pleural effusion. There is patchy airspace disease in the lingula. Musculoskeletal: There is no vertebral compression deformity. Review of the MIP images confirms the above findings. CTA ABDOMEN AND PELVIS FINDINGS VASCULAR Aorta: Mild atherosclerotic calcification. Ectasia of the distal abdominal aorta measures up to 3.5 cm in diameter.  No evidence of dissection in the abdominal aorta. Celiac: Patent.  Branch vessels patent. SMA: Patent. Renals: Single renal arteries are patent. IMA: Significant narrowing at the origin is suspected. Branch vessels patent. Inflow: There are atherosclerotic calcifications involving the common iliac arteries. No significant narrowing. No evidence of aneurysm. No evidence of dissection. Internal and external iliac arteries  are patent. Review of the MIP images confirms the above findings. NON-VASCULAR Hepatobiliary: Diffuse hepatic steatosis. Normal gallbladder. There is a benign-appearing cyst in the caudate lobe of the liver. Pancreas: Unremarkable Spleen: Unremarkable Adrenals/Urinary Tract: Unremarkable appearance of the kidneys. Right adrenal gland is unremarkable. 1.2 cm left adrenal nodule is nonspecific by imaging characteristics. Bladder is within normal limits. Stomach/Bowel: Stomach is unremarkable and decompressed. Duodenum is within normal limits. No evidence of small-bowel obstruction. A portion of the transverse colon traverses into a ventral abdominal hernia. No evidence of colon obstruction. No obvious mass in the colon. Lymphatic: There is no evidence of abnormal retroperitoneal adenopathy. Reproductive: Uterus and adnexa are within normal limits. Other: There is no free fluid. A ventral abdominal hernia contains adipose tissue and transverse colon without evidence of obstruction. Musculoskeletal: No vertebral compression deformity. There is degenerative disc disease at L4-5 and L5-S1. Lumbar facet arthropathy is most prominent at L3-4 and L4-5. Review of the MIP images confirms the above findings. 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 The dissection flap begins just beyond the origin of the left subclavian artery and extends to the mid descending thoracic aorta. The true lumen is dominant. A tiny portion of the false lumen is thrombosed. It largely opacifies with contrast. There is no evidence of extension into branch vessels. There is no evidence of extension below the diaphragm. Critical Value/emergent results were called by telephone at the time of interpretation on 05/31/2017 at 9:30 am to Dr. Bethann Berkshire , who verbally acknowledged these results. Narrowing at the origin of the IMA. Nonvascular: Airspace disease in the lingula. Ventral hernia contains  transverse colon. No evidence of obstruction. Benign cyst in the caudate lobe of the liver. 1.2 cm nonspecific left adrenal nodule. If there is a history or risk of malignancy, consider six-month follow-up MRI. Electronically Signed   By: Jolaine Click M.D.   On: 05/31/2017 09:31    Telemetry    06/01/17: NSR 86  - Personally Reviewed  ECG    05/31/17 NSR with LVH HR 96 - Personally Reviewed  Cardiac Studies   Cath 12/24/15:    Mid LAD lesion, 15 %stenosed.  Ost 1st Diag to 1st Diag lesion, 20 %stenosed.  There is hyperdynamic left ventricular systolic function.  LV end diastolic pressure is normal.  The left ventricular ejection fraction is greater than 65% by visual estimate.   Echo 06/01/17: Pending  Patient Profile     57 y.o. female with a history of non-obstructive CAD per cath 2017, HTN, polysubstance abuse with ETOH/Cocaine/Marijuana and tobacco who presented to the ED on 05/31/17 with hypertensive urgency found to have type B aortic dissection per CT angiogram. Cards, TCTS, and vascular surgery consulted for management.   Assessment & Plan    1. Chest pain associated with acute type B aortic dissection per CT: -Denies chest pain today, breathing improved  -Type B aortic dissection involving the aortic arch and descending thoracic aorta is present per chest CT -BP stabilized with IV Lopressor and Nicardipine gtt -TCTS consulted with plan to utilize IV BB and nicardipine for HTN management with  goals of SBP <120 and DBP <80 -Plan per vascular surgery, re-scan within 72 hours (2/27?)  2. Hypertensive urgency: -BP stabilizing on IV Metoprolol, nicardipine gtt -141/81>124/78>126/81>124/79  3. Acute diastolic heart failure: -Current echo with pending results.  -Last EF evaluation per cath 2017 with EF 65% -BNP on admission, >3,000 -No s/s of fluid volume overload this AM -Chest CT with patchy airspace disease  4. Fever with elevated WBC: -Per PCCM, currently on  IV abx -Remains febrile  5. Mild obstructive CAD: -Per cath 2017  6. AMS: -Resolved, likely r/t HTN urgency  -Precedex d/c'd  7. AKI: -Cr, 1.22>>2.09 today -Likely r/t severe HTN and contrast dye   Signed, Kathyrn Drown NP-C [facility] Pager: [phone] 06/01/2017, 8:46 AM     For questions or updates, please contact   Please consult [email or web address] for contact info under Cardiology/STEMI.

## 2017-06-01 NOTE — Progress Notes (Signed)
PULMONARY / CRITICAL CARE MEDICINE   Name: COLTON ENGDAHL MRN: [id number] DOB: 1961-02-20    ADMISSION DATE:  05/31/2017  REFERRING MD:  EDP   CHIEF COMPLAINT:  Chest pain, type B aortic dissection    HISTORY OF PRESENT ILLNESS:   57yo female with hx HTN, cocaine abuse presented 2/24 with chest pain, AMS, HTN crisis. ER w/u included EKG without ischemic changes, Flu POS, CTA chest which revealed type B dissection. SHe was started on cardene gtt for aggressive BP management and PCCM was called for ICU admission.   SUBJECTIVE:  Improved overnight.  Remains on cardene gtt. Mental status slightly improved.   VITAL SIGNS: BP (!) 141/81 (BP Location: Right Arm)   Pulse 60   Temp (!) 100.9 F (38.3 C) (Oral)   Resp 20   Ht 5\' 7"  (1.702 m)   Wt 87.5 kg (192 lb 14.4 oz)   SpO2 99%   BMI 30.21 kg/m   HEMODYNAMICS:    VENTILATOR SETTINGS:    INTAKE / OUTPUT: I/O last 3 completed shifts: In: 1808.8 [I.V.:1458.8; IV Piggyback:350] Out: 2500 [Urine:2500]  PHYSICAL EXAMINATION: General:  Chronically ill appearing female, NAD in ICU  Neuro:  Awake, alert, oriented HEENT:  Mm moist, no JVD  Cardiovascular:  s1s2 rrr, strong pulses  Lungs:  resps even non labored on [place], clear Abdomen:  Round, soft, non tender  Musculoskeletal:  Warm and dry, no sig edema   LABS:  BMET Recent Labs  Lab 05/31/17 0635 06/01/17 0226  NA 140 140  K 3.6 3.7  CL 101 102  CO2 27 23  BUN 16 24*  CREATININE 1.22* 2.09*  GLUCOSE 137* 94    Electrolytes Recent Labs  Lab 05/31/17 0635 06/01/17 0226  CALCIUM 9.3 8.1*    CBC Recent Labs  Lab 05/31/17 0624 05/31/17 1252 06/01/17 0226  WBC 8.7 12.5* 10.0  HGB 13.3 12.2 11.5*  HCT 41.2 37.9 36.5  PLT 190 176 154    Coag's No results for input(s): APTT, INR in the last 168 hours.  Sepsis Markers Recent Labs  Lab 05/31/17 1252 06/01/17 0226  PROCALCITON 2.10 5.42    ABG Recent Labs  Lab 05/31/17 1252  PHART 7.348*  PCO2ART  49.8*  PO2ART 75.0*    Liver Enzymes Recent Labs  Lab 05/31/17 0635  AST 36  ALT 48  ALKPHOS 99  BILITOT 1.5*  ALBUMIN 4.4    Cardiac Enzymes No results for input(s): TROPONINI, PROBNP in the last 168 hours.  Glucose Recent Labs  Lab 05/31/17 1155 06/01/17 0718  GLUCAP 143* 112*    Imaging Ct Angio Head W Or Wo Contrast  Result Date: 05/31/2017 CLINICAL DATA:  Known acute aortic dissection. Mental status change. EXAM: CT ANGIOGRAPHY HEAD AND NECK TECHNIQUE: Multidetector CT imaging of the head and neck was performed using the standard protocol during bolus administration of intravenous contrast. Multiplanar CT image reconstructions and MIPs were obtained to evaluate the vascular anatomy. Carotid stenosis measurements (when applicable) are obtained utilizing NASCET criteria, using the distal internal carotid diameter as the denominator. CONTRAST:  71mL ISOVUE-370 IOPAMIDOL (ISOVUE-370) INJECTION 76% COMPARISON:  CTA of the chest, abdomen, and pelvis from the same day at 8:38 a.m. FINDINGS: CT HEAD FINDINGS Brain: No acute infarct, hemorrhage, No acute infarct, hemorrhage, or mass lesion is present. The ventricles are of normal size. No significant extraaxial fluid collection is present. No significant white matter disease is present. The brainstem and cerebellum are normal. Vascular: No hyperdense vessel or  unexpected calcification. Skull: The calvarium is intact. No focal lytic or blastic lesions are present. Sinuses: The paranasal sinuses and mastoid air cells are clear. There is extensive pneumatization of the mastoids bilaterally. Orbits: Globes and orbits are within normal limits. Review of the MIP images confirms the above findings CTA NECK FINDINGS Aortic arch: A 3 vessel arch configuration is present. 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 There is no involvement of the great vessel origins. Right carotid  system: The right common carotid artery is mildly tortuous without focal stenosis. Bifurcation is unremarkable. There is mild tortuosity of the cervical right ICA without stenosis. Left carotid system: The left common carotid artery demonstrates moderate tortuosity proximally without significant stenosis. Bifurcation is unremarkable. Cervical left ICA is mildly tortuous. There is no significant stenosis. Vertebral arteries: The vertebral arteries originate from the subclavian arteries bilaterally without significant stenosis. The vertebral arteries are codominant. There is no focal stenosis or vascular injury to either vertebral artery in the neck. Skeleton: Vertebral body heights alignment are maintained. No focal lytic or blastic lesions are present. Facet degenerative changes are most evident at C3-4 and C7-T1 on the left. There is congenital fusion at C2-3. Other neck: A the soft tissues the neck are otherwise unremarkable. No focal mucosal or submucosal lesions are present. Vocal cords are midline and symmetric. Upper chest: Airspace consolidation is present in the left lower lobe. There is patchy airspace disease throughout the left upper lobe. Mild atelectasis is present on the right. Review of the MIP images confirms the above findings CTA HEAD FINDINGS Anterior circulation: Mild narrowing of the cavernous right internal carotid artery is less than 50%. The right A1 segment is aplastic. Both A2 segments fill from the left. The right M1 segment demonstrates mild irregularity distally without a significant stenosis. The MCA bifurcations are intact. Medium and distal small vessel irregularity is present throughout the ACA and MCA branches without a significant proximal stenosis or occlusion. Posterior circulation: The vertebral arteries are codominant. The basilar artery is normal. Both posterior cerebral arteries originate from the basilar tip. There is some attenuation of distal PCA branch vessels without a  significant proximal stenosis or occlusion. Venous sinuses: The dural sinuses are patent. The straight sinus and deep cerebral veins are intact. Cortical veins are unremarkable. Anatomic variants: None Delayed phase: Unremarkable Review of the MIP images confirms the above findings IMPRESSION: 1. 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 2. Tortuosity of the carotid arteries bilaterally without focal stenosis. This likely reflects the sequelae of chronic hypertension. 3. Medium and distal small vessel atherosclerotic irregularity throughout the circle-of-Willis without a significant proximal stenosis, aneurysm, or branch vessel occlusion. 4. No acute intracranial abnormality. 5. 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 Electronically Signed   By: Marin Roberts M.D.   On: 05/31/2017 18:20   Ct Angio Neck W Or Wo Contrast  Result Date: 05/31/2017 CLINICAL DATA:  Known acute aortic dissection. Mental status change. EXAM: CT ANGIOGRAPHY HEAD AND NECK TECHNIQUE: Multidetector CT imaging of the head and neck was performed using the standard protocol during bolus administration of intravenous contrast. Multiplanar CT image reconstructions and MIPs were obtained to evaluate the vascular anatomy. Carotid stenosis measurements (when applicable) are obtained utilizing NASCET criteria, using the distal internal carotid diameter as the denominator. CONTRAST:  109mL ISOVUE-370 IOPAMIDOL (ISOVUE-370) INJECTION 76% COMPARISON:  CTA  of the chest, abdomen, and pelvis from the same day at 8:38 a.m. FINDINGS: CT HEAD FINDINGS Brain: No acute infarct, hemorrhage, No acute infarct, hemorrhage, or mass lesion is present. The ventricles are of normal size. No significant extraaxial fluid collection is present. No significant white matter disease is present. The brainstem and cerebellum are normal. Vascular: No hyperdense vessel or  unexpected calcification. Skull: The calvarium is intact. No focal lytic or blastic lesions are present. Sinuses: The paranasal sinuses and mastoid air cells are clear. There is extensive pneumatization of the mastoids bilaterally. Orbits: Globes and orbits are within normal limits. Review of the MIP images confirms the above findings CTA NECK FINDINGS Aortic arch: A 3 vessel arch configuration is present. 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 There is no involvement of the great vessel origins. Right carotid system: The right common carotid artery is mildly tortuous without focal stenosis. Bifurcation is unremarkable. There is mild tortuosity of the cervical right ICA without stenosis. Left carotid system: The left common carotid artery demonstrates moderate tortuosity proximally without significant stenosis. Bifurcation is unremarkable. Cervical left ICA is mildly tortuous. There is no significant stenosis. Vertebral arteries: The vertebral arteries originate from the subclavian arteries bilaterally without significant stenosis. The vertebral arteries are codominant. There is no focal stenosis or vascular injury to either vertebral artery in the neck. Skeleton: Vertebral body heights alignment are maintained. No focal lytic or blastic lesions are present. Facet degenerative changes are most evident at C3-4 and C7-T1 on the left. There is congenital fusion at C2-3. Other neck: A the soft tissues the neck are otherwise unremarkable. No focal mucosal or submucosal lesions are present. Vocal cords are midline and symmetric. Upper chest: Airspace consolidation is present in the left lower lobe. There is patchy airspace disease throughout the left upper lobe. Mild atelectasis is present on the right. Review of the MIP images confirms the above findings CTA HEAD FINDINGS Anterior circulation: Mild narrowing of the cavernous right internal carotid  artery is less than 50%. The right A1 segment is aplastic. Both A2 segments fill from the left. The right M1 segment demonstrates mild irregularity distally without a significant stenosis. The MCA bifurcations are intact. Medium and distal small vessel irregularity is present throughout the ACA and MCA branches without a significant proximal stenosis or occlusion. Posterior circulation: The vertebral arteries are codominant. The basilar artery is normal. Both posterior cerebral arteries originate from the basilar tip. There is some attenuation of distal PCA branch vessels without a significant proximal stenosis or occlusion. Venous sinuses: The dural sinuses are patent. The straight sinus and deep cerebral veins are intact. Cortical veins are unremarkable. Anatomic variants: None Delayed phase: Unremarkable Review of the MIP images confirms the above findings IMPRESSION: 1. 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 2. Tortuosity of the carotid arteries bilaterally without focal stenosis. This likely reflects the sequelae of chronic hypertension. 3. Medium and distal small vessel atherosclerotic irregularity throughout the circle-of-Willis without a significant proximal stenosis, aneurysm, or branch vessel occlusion. 4. No acute intracranial abnormality. 5. 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 Electronically Signed   By: San Morelle M.D.   On: 05/31/2017 18:20     STUDIES:  CTA chest 2/24>>> The study is positive for a Stanford type B thoracic aortic dissection. The dissection flap begins just beyond the origin of the  left subclavian artery and extends to the mid descending thoracic aorta. The true lumen is dominant. A tiny portion of the false lumen is thrombosed. It largely opacifies with contrast. There is no evidence of extension into branch vessels. There is no evidence of extension below the  diaphragm.  Narrowing at the origin of the IMA.  Airspace disease in the lingula.  Ventral hernia contains transverse colon. No evidence of obstruction.  Benign cyst in the caudate lobe of the liver.  1.2 cm nonspecific left adrenal nodule.  CTA head/neck 2/24>>> 1. 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 2. Tortuosity of the carotid arteries bilaterally without focal stenosis. This likely reflects the sequelae of chronic hypertension. 3. Medium and distal small vessel atherosclerotic irregularity throughout the circle-of-Willis without a significant proximal stenosis, aneurysm, or branch vessel occlusion. 4. No acute intracranial abnormality. 5. 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  CULTURES: BC x 2 2/24>>>  RVP 2/24>>> POS flu A   ANTIBIOTICS: Rocephin 2/24>>> azithro 2/24>>  tamiflu 2/24>>>  SIGNIFICANT EVENTS:   LINES/TUBES:   DISCUSSION: 57yo female with hx HTN, cocaine abuse admitted 2/24 with HTN crisis, Type B aortic dissection and Flu A.  ASSESSMENT / PLAN:  PULMONARY ? CAP - lingular infiltrate  Flu A  P:   Continue empiric abx as above  tamiflu  Pulmonary hygiene  F/u CXR  Supplemental O2 as needed to keep sats >92%   CARDIOVASCULAR HTN crisis  Type B aortic dissection  P:  Tight BP control with cardene gtt  Await UDS - add B blockade if cocaine neg and wean off cardene gtt   CVTS following - no plans for intervention    RENAL AKI - likely r/t contrast  P:   F/u chem  Gentle volume - NS $Re'@75ml'CIE$ /hr    GASTROINTESTINAL Aortic dissection as above - no dissection below level of the diaphragm. Benign abd exam. P:   Monitor    HEMATOLOGIC No active issue  P:  SQ heparin   INFECTIOUS ? Cap  Flu A  Pct 5.42 P:   Abx, tamiflu as above  Monitor culture data  Trend PCT   ENDOCRINE No active issue  P:   Monitor glucose on  chem   NEUROLOGIC AMS - in setting HTN crisis. Resolved.  P:   D/c precedex from [facility]   FAMILY  - Updates:  Pt updated at bedside 2/25  Will hold in ICU today, continue cardene gtt for tight BP control in setting type B dissection.   - Inter-disciplinary family meet or Palliative Care meeting due by:  Day 7    Nickolas Madrid, NP 06/01/2017  9:04 AM Pager: [phone] or [phone]

## 2017-06-02 DIAGNOSIS — I7101 Dissection of thoracic aorta: Secondary | ICD-10-CM

## 2017-06-02 DIAGNOSIS — I169 Hypertensive crisis, unspecified: Secondary | ICD-10-CM

## 2017-06-02 DIAGNOSIS — J1100 Influenza due to unidentified influenza virus with unspecified type of pneumonia: Secondary | ICD-10-CM

## 2017-06-02 LAB — RAPID URINE DRUG SCREEN, HOSP PERFORMED
Amphetamines: NOT DETECTED
Barbiturates: NOT DETECTED
Benzodiazepines: NOT DETECTED
COCAINE: NOT DETECTED
OPIATES: NOT DETECTED
TETRAHYDROCANNABINOL: NOT DETECTED

## 2017-06-02 LAB — BASIC METABOLIC PANEL
ANION GAP: 11 (ref 5–15)
BUN: 22 mg/dL — ABNORMAL HIGH (ref 6–20)
CALCIUM: 8 mg/dL — AB (ref 8.9–10.3)
CO2: 23 mmol/L (ref 22–32)
Chloride: 101 mmol/L (ref 101–111)
Creatinine, Ser: 1.63 mg/dL — ABNORMAL HIGH (ref 0.44–1.00)
GFR, EST AFRICAN AMERICAN: 40 mL/min — AB (ref >60)
GFR, EST NON AFRICAN AMERICAN: 34 mL/min — AB (ref >60)
GLUCOSE: 115 mg/dL — AB (ref 65–99)
Potassium: 3.2 mmol/L — ABNORMAL LOW (ref 3.5–5.1)
Sodium: 135 mmol/L (ref 135–145)

## 2017-06-02 LAB — CBC
HEMATOCRIT: 34.5 % — AB (ref 36.0–46.0)
Hemoglobin: 11.1 g/dL — ABNORMAL LOW (ref 12.0–15.0)
MCH: 31.2 pg (ref 26.0–34.0)
MCHC: 32.2 g/dL (ref 30.0–36.0)
MCV: 96.9 fL (ref 78.0–100.0)
PLATELETS: 158 10*3/uL (ref 150–400)
RBC: 3.56 MIL/uL — AB (ref 3.87–5.11)
RDW: 15.9 % — AB (ref 11.5–15.5)
WBC: 4 10*3/uL (ref 4.0–10.5)

## 2017-06-02 LAB — PHOSPHORUS: PHOSPHORUS: 3.7 mg/dL (ref 2.5–4.6)

## 2017-06-02 LAB — PROCALCITONIN: PROCALCITONIN: 3.23 ng/mL

## 2017-06-02 LAB — MAGNESIUM: Magnesium: 2 mg/dL (ref 1.7–2.4)

## 2017-06-02 MED ORDER — LABETALOL HCL 5 MG/ML IV SOLN
10.0000 mg | INTRAVENOUS | Status: DC | PRN
  Administered 2017-06-02: 10 mg via INTRAVENOUS
  Administered 2017-06-03 – 2017-06-04 (×3): 20 mg via INTRAVENOUS
  Filled 2017-06-02 (×4): qty 4

## 2017-06-02 MED ORDER — AMLODIPINE BESYLATE 10 MG PO TABS
10.0000 mg | ORAL_TABLET | Freq: Every day | ORAL | Status: DC
  Administered 2017-06-03 – 2017-06-15 (×13): 10 mg via ORAL
  Filled 2017-06-02 (×14): qty 1

## 2017-06-02 MED ORDER — POTASSIUM CHLORIDE CRYS ER 20 MEQ PO TBCR
40.0000 meq | EXTENDED_RELEASE_TABLET | Freq: Once | ORAL | Status: AC
  Administered 2017-06-02: 40 meq via ORAL
  Filled 2017-06-02: qty 2

## 2017-06-02 MED ORDER — AMLODIPINE BESYLATE 5 MG PO TABS
5.0000 mg | ORAL_TABLET | Freq: Every day | ORAL | Status: DC
  Administered 2017-06-02: 5 mg via ORAL
  Filled 2017-06-02: qty 1

## 2017-06-02 MED ORDER — POTASSIUM CHLORIDE 10 MEQ/100ML IV SOLN
10.0000 meq | INTRAVENOUS | Status: DC
  Administered 2017-06-02: 10 meq via INTRAVENOUS
  Filled 2017-06-02: qty 100

## 2017-06-02 NOTE — Progress Notes (Signed)
2nd attempt to call report to [place], [place]. Asked to give report to charge RN. Charge RN unavailable at this time. Left RN call back phone # with Network engineer.

## 2017-06-02 NOTE — Progress Notes (Signed)
  Progress Note    06/02/2017 8:14 AM * No surgery found *  Subjective:  Feeling better  Vitals:   06/02/17 0735 06/02/17 0800  BP: 134/86 127/83  Pulse: 67 64  Resp:  (!) 25  Temp:    SpO2:  96%    Physical Exam: aaox3 Non labored respirations Abdomen is soft, reducible hernia Palpable pedal pulses  CBC    Component Value Date/Time   WBC 4.0 06/02/2017 0415   RBC 3.56 (L) 06/02/2017 0415   HGB 11.1 (L) 06/02/2017 0415   HCT 34.5 (L) 06/02/2017 0415   PLT 158 06/02/2017 0415   MCV 96.9 06/02/2017 0415   MCH 31.2 06/02/2017 0415   MCHC 32.2 06/02/2017 0415   RDW 15.9 (H) 06/02/2017 0415   LYMPHSABS 0.1 (L) 05/31/2017 1252   MONOABS 0.9 05/31/2017 1252   EOSABS 0.0 05/31/2017 1252   BASOSABS 0.0 05/31/2017 1252    BMET    Component Value Date/Time   NA 135 06/02/2017 0415   K 3.2 (L) 06/02/2017 0415   CL 101 06/02/2017 0415   CO2 23 06/02/2017 0415   GLUCOSE 115 (H) 06/02/2017 0415   BUN 22 (H) 06/02/2017 0415   CREATININE 1.63 (H) 06/02/2017 0415   CALCIUM 8.0 (L) 06/02/2017 0415   GFRNONAA 34 (L) 06/02/2017 0415   GFRAA 40 (L) 06/02/2017 0415    INR    Component Value Date/Time   INR 1.04 12/23/2015 1040     Intake/Output Summary (Last 24 hours) at 06/02/2017 0814 Last data filed at 06/02/2017 0741 Gross per 24 hour  Intake 3085.5 ml  Output 2050 ml  Net 1035.5 ml     Assessment:  57 y.o. female is here with tbad. AKI now resolving Plan: Continue iv antihtn Will rescan when aki fully resolved Will be at high risk to need surgical repair in the future given size of aorta on presentation-currently no indication of malperfusion   Roshawn Ayala C. Donzetta Matters, MD [facility] Office: [phone] Pager: [phone]  06/02/2017 8:14 AM

## 2017-06-02 NOTE — Progress Notes (Signed)
Attempted to call report to [place], Therapist, sports. Left RN call back phone # with Network engineer.

## 2017-06-02 NOTE — Progress Notes (Signed)
[facility] Progress Note Patient Name: AELIANA SPATES DOB: Aug 11, 1960 MRN: [id number]   Date of Service  06/02/2017  HPI/Events of Note  K+ = 3.2 and Creatinine = 1.63. Patient being diuresed with Lasix.   eICU Interventions  Will replace K+.      Intervention Category Major Interventions: Electrolyte abnormality - evaluation and management  Sommer,Steven Eugene 06/02/2017, 6:48 AM

## 2017-06-02 NOTE — Progress Notes (Signed)
PULMONARY / CRITICAL CARE MEDICINE   Name: Dorothy Harris MRN: [id number] DOB: 12/30/1960    ADMISSION DATE:  05/31/2017  REFERRING MD:  EDP   CHIEF COMPLAINT:  Chest pain, type B aortic dissection    HISTORY OF PRESENT ILLNESS:   57yo female with hx HTN, cocaine abuse presented 2/24 with chest pain, AMS, HTN crisis. ER w/u included EKG without ischemic changes, Flu POS, CTA chest which revealed type B dissection. SHe was started on cardene gtt for aggressive BP management and PCCM was called for ICU admission.   SUBJECTIVE:  Improved overnight.  Remains on cardene gtt. At 3 mg /hr. Alert and following commands. T Max 103.1, suspect viral as WBC is 4.0. PCT 3.23  VITAL SIGNS: BP 117/75   Pulse (!) 59   Temp 99.3 F (37.4 C) (Oral)   Resp 17   Ht $R'5\' 7"'kv$  (1.702 m)   Wt 175 lb 4.3 oz (79.5 kg)   SpO2 95%   BMI 27.45 kg/m   HEMODYNAMICS:    VENTILATOR SETTINGS:    INTAKE / OUTPUT: I/O last 3 completed shifts: In: 3552.3 [P.O.:840; I.V.:2612.3; IV Piggyback:100] Out: 2450 [Urine:2450]  PHYSICAL EXAMINATION: General:  Chronically ill appearing female, NAD, awake and following commands Neuro:  Awake, alert, oriented, MAE and following commands HEENT:  Mm moist, no JVD  Cardiovascular:  s1s2 rrr, No RMG Lungs:  resps even non labored on [place], clear but diminished per bases. Abdomen:  Round, soft, non tender , BS+ Musculoskeletal:  Warm and dry, no sig edema , no obvious deformities  LABS:  BMET Recent Labs  Lab 05/31/17 0635 06/01/17 0226 06/02/17 0415  NA 140 140 135  K 3.6 3.7 3.2*  CL 101 102 101  CO2 $Re'27 23 23  'tJT$ BUN 16 24* 22*  CREATININE 1.22* 2.09* 1.63*  GLUCOSE 137* 94 115*    Electrolytes Recent Labs  Lab 05/31/17 0635 06/01/17 0226 06/02/17 0415  CALCIUM 9.3 8.1* 8.0*  MG  --   --  2.0  PHOS  --   --  3.7    CBC Recent Labs  Lab 05/31/17 1252 06/01/17 0226 06/02/17 0415  WBC 12.5* 10.0 4.0  HGB 12.2 11.5* 11.1*  HCT 37.9 36.5 34.5*   PLT 176 154 158    Coag's No results for input(s): APTT, INR in the last 168 hours.  Sepsis Markers Recent Labs  Lab 05/31/17 1252 06/01/17 0226 06/02/17 0415  PROCALCITON 2.10 5.42 3.23    ABG Recent Labs  Lab 05/31/17 1252  PHART 7.348*  PCO2ART 49.8*  PO2ART 75.0*    Liver Enzymes Recent Labs  Lab 05/31/17 0635  AST 36  ALT 48  ALKPHOS 99  BILITOT 1.5*  ALBUMIN 4.4    Cardiac Enzymes No results for input(s): TROPONINI, PROBNP in the last 168 hours.  Glucose Recent Labs  Lab 05/31/17 1155 06/01/17 0718  GLUCAP 143* 112*    Imaging Dg Chest 1 View  Result Date: 06/01/2017 CLINICAL DATA:  CHF. EXAM: CHEST 1 VIEW COMPARISON:  Chest x-ray dated May 31, 2017. FINDINGS: Stable cardiomegaly. Normal pulmonary vascularity. Unchanged patchy opacities in the lingula. No pleural effusion or the mid pneumothorax. No acute osseous abnormality. IMPRESSION: Unchanged lingular airspace disease. Electronically Signed   By: Titus Dubin M.D.   On: 06/01/2017 11:29     STUDIES:  CTA chest 2/24>>> The study is positive for a Stanford type B thoracic aortic dissection. The dissection flap begins just beyond the origin of the left  subclavian artery and extends to the mid descending thoracic aorta. The true lumen is dominant. A tiny portion of the false lumen is thrombosed. It largely opacifies with contrast. There is no evidence of extension into branch vessels. There is no evidence of extension below the diaphragm.  Narrowing at the origin of the IMA.  Airspace disease in the lingula.  Ventral hernia contains transverse colon. No evidence of obstruction.  Benign cyst in the caudate lobe of the liver.  1.2 cm nonspecific left adrenal nodule.  CTA head/neck 2/24>>> 1. 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 2. Tortuosity of the carotid arteries bilaterally without  focal stenosis. This likely reflects the sequelae of chronic hypertension. 3. Medium and distal small vessel atherosclerotic irregularity throughout the circle-of-Willis without a significant proximal stenosis, aneurysm, or branch vessel occlusion. 4. No acute intracranial abnormality. 5. 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  CULTURES: BC x 2 2/24>>>  RVP 2/24>>> POS flu A   ANTIBIOTICS: Rocephin 2/24>>> azithro 2/24>>  tamiflu 2/24>>>  SIGNIFICANT EVENTS:   LINES/TUBES:   DISCUSSION: 57yo female with hx HTN, cocaine abuse admitted 2/24 with HTN crisis, Type B aortic dissection and Flu A.Started on Cardene gtt for BP control.  ASSESSMENT / PLAN:  PULMONARY ? CAP - lingular infiltrate  Flu A  P:   Continue empiric abx as above  Continue tamiflu  Aggressive Pulmonary hygiene IS Mobilize as able  F/u CXR  Supplemental O2 as needed to keep sats >92%   CARDIOVASCULAR HTN crisis  Type B aortic dissection  UDS pending 2/26 P:  Tight BP control with cardene gtt  Wean Cardene as able as norvasc added 2/26 Await UDS - add B blockade if cocaine neg and wean off cardene gtt   CVTS following - no plans for intervention    RENAL AKI - likely r/t contrast  Creat down trending but remains elevated P:   Trend chem  Gentle volume - NS $Re'@75ml'Bsu$ /hr  Monitor UO Avoid nephrotoxic medications    GASTROINTESTINAL Aortic dissection as above - no dissection below level of the diaphragm. Benign abd exam. P:   Monitor  SUP   HEMATOLOGIC No active issue Platelets dropped slightly, 158,000  P:  SQ heparin  CBC daily Transfuse for HGB < 7  INFECTIOUS ? Cap  Flu A  Pct 5.42 Culture data pending T Max 103.1 >> viral as normal WBC P:   Abx, tamiflu as above  Monitor culture data  Trend PCT  Monitor fever and WBC  ENDOCRINE No active issue  P:   Monitor glucose on chem   NEUROLOGIC AMS - in setting HTN crisis. Resolved.  P:   D/[facility] precedex  from [facility]   FAMILY  - Updates:  Pt updated at bedside 2/26  Will hold in ICU until Cardene is discontinued, continue cardene gtt for tight BP control in setting type B dissection. Added Norvasc 2/26, awaiting UDS results ( Use of Beta Blockade)  - Inter-disciplinary family meet or Palliative Care meeting due by:  Day 7    Dorothy Harris Spatz, AGACNP-BC 06/02/2017  8:41 AM Pager: [phone]

## 2017-06-02 NOTE — Progress Notes (Signed)
Attempted to call report to nurse getting patient on 4E. RN unable to take report at this time. Left call back # with Network engineer.

## 2017-06-02 NOTE — Progress Notes (Signed)
K+ 3.2 this AM. E-Link MD notified. No new orders to replace have been received.  Will continue to monitor.   Mary-Ann Pennella E Reola [place], [place]

## 2017-06-02 NOTE — Progress Notes (Signed)
Progress Note  Patient Name: Dorothy Harris Date of Encounter: 06/02/2017  Primary Cardiologist: No primary care provider on file.   Subjective   No chest pain.  Still with cough.  BP much improved on Carvedilol  Inpatient Medications    Scheduled Meds: . carvedilol  12.5 mg Oral BID WC  . furosemide  40 mg Intravenous BID  . heparin  5,000 Units Subcutaneous Q8H  . Influenza vac split quadrivalent PF  0.5 mL Intramuscular Tomorrow-1000  . mouth rinse  15 mL Mouth Rinse BID  . oseltamivir  30 mg Oral BID  . pneumococcal 23 valent vaccine  0.5 mL Intramuscular Tomorrow-1000  . thiamine injection  100 mg Intravenous Daily   Continuous Infusions: . sodium chloride Stopped (05/31/17 1600)  . sodium chloride 75 mL/hr at 06/02/17 0600  . azithromycin Stopped (06/01/17 1522)  . cefTRIAXone (ROCEPHIN)  IV Stopped (06/01/17 1318)  . niCARDipine 5.5 mg/hr (06/02/17 0654)   PRN Meds: sodium chloride, acetaminophen, acetaminophen, albuterol, morphine injection, ondansetron (ZOFRAN) IV   Vital Signs    Vitals:   06/02/17 0700 06/02/17 0725 06/02/17 0730 06/02/17 0735  BP: 134/76  134/86 134/86  Pulse: 65  69 67  Resp: (!) 22  (!) 24   Temp:  99.3 F (37.4 C)    TempSrc:  Oral    SpO2: 98%  97%   Weight:      Height:        Intake/Output Summary (Last 24 hours) at 06/02/2017 0749 Last data filed at 06/02/2017 0741 Gross per 24 hour  Intake 3136.43 ml  Output 2125 ml  Net 1011.43 ml   Filed Weights   05/31/17 0625 06/01/17 0500 06/02/17 0500  Weight: 160 lb (72.6 kg) 192 lb 14.4 oz (87.5 kg) 175 lb 4.3 oz (79.5 kg)    Telemetry    NSR - Personally Reviewed  ECG    No new EKG to review - Personally Reviewed  Physical Exam   GEN: No acute distress.   Neck: No JVD Cardiac: RRR, no murmurs, rubs, or gallops.  Respiratory: Clear to auscultation bilaterally. GI: Soft, nontender, non-distended  MS: No edema; No deformity. Neuro:  Nonfocal  Psych: Normal affect     Labs    Chemistry Recent Labs  Lab 05/31/17 0635 06/01/17 0226 06/02/17 0415  NA 140 140 135  K 3.6 3.7 3.2*  CL 101 102 101  CO2 $Re'27 23 23  'rNk$ GLUCOSE 137* 94 115*  BUN 16 24* 22*  CREATININE 1.22* 2.09* 1.63*  CALCIUM 9.3 8.1* 8.0*  PROT 7.9  --   --   ALBUMIN 4.4  --   --   AST 36  --   --   ALT 48  --   --   ALKPHOS 99  --   --   BILITOT 1.5*  --   --   GFRNONAA 49* 25* 34*  GFRAA 56* 29* 40*  ANIONGAP $RemoveB'12 15 11     'oxxwaxvJ$ Hematology Recent Labs  Lab 05/31/17 1252 06/01/17 0226 06/02/17 0415  WBC 12.5* 10.0 4.0  RBC 3.90 3.71* 3.56*  HGB 12.2 11.5* 11.1*  HCT 37.9 36.5 34.5*  MCV 97.2 98.4 96.9  MCH 31.3 31.0 31.2  MCHC 32.2 31.5 32.2  RDW 16.0* 16.1* 15.9*  PLT 176 154 158    Cardiac EnzymesNo results for input(s): TROPONINI in the last 168 hours.  Recent Labs  Lab 05/31/17 0635  TROPIPOC 0.02     BNP Recent Labs  Lab  05/31/17 0635 06/01/17 1112  BNP 3,012.0* 1,459.3*     DDimer No results for input(s): DDIMER in the last 168 hours.   Radiology    Ct Angio Head W Or Wo Contrast  Result Date: 05/31/2017 CLINICAL DATA:  Known acute aortic dissection. Mental status change. EXAM: CT ANGIOGRAPHY HEAD AND NECK TECHNIQUE: Multidetector CT imaging of the head and neck was performed using the standard protocol during bolus administration of intravenous contrast. Multiplanar CT image reconstructions and MIPs were obtained to evaluate the vascular anatomy. Carotid stenosis measurements (when applicable) are obtained utilizing NASCET criteria, using the distal internal carotid diameter as the denominator. CONTRAST:  34mL ISOVUE-370 IOPAMIDOL (ISOVUE-370) INJECTION 76% COMPARISON:  CTA of the chest, abdomen, and pelvis from the same day at 8:38 a.m. FINDINGS: CT HEAD FINDINGS Brain: No acute infarct, hemorrhage, No acute infarct, hemorrhage, or mass lesion is present. The ventricles are of normal size. No significant extraaxial fluid collection is present. No  significant white matter disease is present. The brainstem and cerebellum are normal. Vascular: No hyperdense vessel or unexpected calcification. Skull: The calvarium is intact. No focal lytic or blastic lesions are present. Sinuses: The paranasal sinuses and mastoid air cells are clear. There is extensive pneumatization of the mastoids bilaterally. Orbits: Globes and orbits are within normal limits. Review of the MIP images confirms the above findings CTA NECK FINDINGS Aortic arch: A 3 vessel arch configuration is present. 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 There is no involvement of the great vessel origins. Right carotid system: The right common carotid artery is mildly tortuous without focal stenosis. Bifurcation is unremarkable. There is mild tortuosity of the cervical right ICA without stenosis. Left carotid system: The left common carotid artery demonstrates moderate tortuosity proximally without significant stenosis. Bifurcation is unremarkable. Cervical left ICA is mildly tortuous. There is no significant stenosis. Vertebral arteries: The vertebral arteries originate from the subclavian arteries bilaterally without significant stenosis. The vertebral arteries are codominant. There is no focal stenosis or vascular injury to either vertebral artery in the neck. Skeleton: Vertebral body heights alignment are maintained. No focal lytic or blastic lesions are present. Facet degenerative changes are most evident at C3-4 and C7-T1 on the left. There is congenital fusion at C2-3. Other neck: A the soft tissues the neck are otherwise unremarkable. No focal mucosal or submucosal lesions are present. Vocal cords are midline and symmetric. Upper chest: Airspace consolidation is present in the left lower lobe. There is patchy airspace disease throughout the left upper lobe. Mild atelectasis is present on the right. Review of the MIP images  confirms the above findings CTA HEAD FINDINGS Anterior circulation: Mild narrowing of the cavernous right internal carotid artery is less than 50%. The right A1 segment is aplastic. Both A2 segments fill from the left. The right M1 segment demonstrates mild irregularity distally without a significant stenosis. The MCA bifurcations are intact. Medium and distal small vessel irregularity is present throughout the ACA and MCA branches without a significant proximal stenosis or occlusion. Posterior circulation: The vertebral arteries are codominant. The basilar artery is normal. Both posterior cerebral arteries originate from the basilar tip. There is some attenuation of distal PCA branch vessels without a significant proximal stenosis or occlusion. Venous sinuses: The dural sinuses are patent. The straight sinus and deep cerebral veins are intact. Cortical veins are unremarkable. Anatomic variants: None Delayed phase: Unremarkable Review of the MIP images confirms the above  findings IMPRESSION: 1. 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 2. Tortuosity of the carotid arteries bilaterally without focal stenosis. This likely reflects the sequelae of chronic hypertension. 3. Medium and distal small vessel atherosclerotic irregularity throughout the circle-of-Willis without a significant proximal stenosis, aneurysm, or branch vessel occlusion. 4. No acute intracranial abnormality. 5. 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 Electronically Signed   By: Marin Roberts M.D.   On: 05/31/2017 18:20   Dg Chest 1 View  Result Date: 06/01/2017 CLINICAL DATA:  CHF. EXAM: CHEST 1 VIEW COMPARISON:  Chest x-ray dated May 31, 2017. FINDINGS: Stable cardiomegaly. Normal pulmonary vascularity. Unchanged patchy opacities in the lingula. No pleural effusion or the mid pneumothorax. No acute osseous abnormality. IMPRESSION:  Unchanged lingular airspace disease. Electronically Signed   By: Obie Dredge M.D.   On: 06/01/2017 11:29   Ct Angio Neck W Or Wo Contrast  Result Date: 05/31/2017 CLINICAL DATA:  Known acute aortic dissection. Mental status change. EXAM: CT ANGIOGRAPHY HEAD AND NECK TECHNIQUE: Multidetector CT imaging of the head and neck was performed using the standard protocol during bolus administration of intravenous contrast. Multiplanar CT image reconstructions and MIPs were obtained to evaluate the vascular anatomy. Carotid stenosis measurements (when applicable) are obtained utilizing NASCET criteria, using the distal internal carotid diameter as the denominator. CONTRAST:  10mL ISOVUE-370 IOPAMIDOL (ISOVUE-370) INJECTION 76% COMPARISON:  CTA of the chest, abdomen, and pelvis from the same day at 8:38 a.m. FINDINGS: CT HEAD FINDINGS Brain: No acute infarct, hemorrhage, No acute infarct, hemorrhage, or mass lesion is present. The ventricles are of normal size. No significant extraaxial fluid collection is present. No significant white matter disease is present. The brainstem and cerebellum are normal. Vascular: No hyperdense vessel or unexpected calcification. Skull: The calvarium is intact. No focal lytic or blastic lesions are present. Sinuses: The paranasal sinuses and mastoid air cells are clear. There is extensive pneumatization of the mastoids bilaterally. Orbits: Globes and orbits are within normal limits. Review of the MIP images confirms the above findings CTA NECK FINDINGS Aortic arch: A 3 vessel arch configuration is present. 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 There is no involvement of the great vessel origins. Right carotid system: The right common carotid artery is mildly tortuous without focal stenosis. Bifurcation is unremarkable. There is mild tortuosity of the cervical right ICA without stenosis. Left carotid system: The  left common carotid artery demonstrates moderate tortuosity proximally without significant stenosis. Bifurcation is unremarkable. Cervical left ICA is mildly tortuous. There is no significant stenosis. Vertebral arteries: The vertebral arteries originate from the subclavian arteries bilaterally without significant stenosis. The vertebral arteries are codominant. There is no focal stenosis or vascular injury to either vertebral artery in the neck. Skeleton: Vertebral body heights alignment are maintained. No focal lytic or blastic lesions are present. Facet degenerative changes are most evident at C3-4 and C7-T1 on the left. There is congenital fusion at C2-3. Other neck: A the soft tissues the neck are otherwise unremarkable. No focal mucosal or submucosal lesions are present. Vocal cords are midline and symmetric. Upper chest: Airspace consolidation is present in the left lower lobe. There is patchy airspace disease throughout the left upper lobe. Mild atelectasis is present on the right. Review of the MIP images confirms the above findings CTA HEAD FINDINGS Anterior circulation: Mild narrowing of the cavernous  right internal carotid artery is less than 50%. The right A1 segment is aplastic. Both A2 segments fill from the left. The right M1 segment demonstrates mild irregularity distally without a significant stenosis. The MCA bifurcations are intact. Medium and distal small vessel irregularity is present throughout the ACA and MCA branches without a significant proximal stenosis or occlusion. Posterior circulation: The vertebral arteries are codominant. The basilar artery is normal. Both posterior cerebral arteries originate from the basilar tip. There is some attenuation of distal PCA branch vessels without a significant proximal stenosis or occlusion. Venous sinuses: The dural sinuses are patent. The straight sinus and deep cerebral veins are intact. Cortical veins are unremarkable. Anatomic variants: None  Delayed phase: Unremarkable Review of the MIP images confirms the above findings IMPRESSION: 1. 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 2. Tortuosity of the carotid arteries bilaterally without focal stenosis. This likely reflects the sequelae of chronic hypertension. 3. Medium and distal small vessel atherosclerotic irregularity throughout the circle-of-Willis without a significant proximal stenosis, aneurysm, or branch vessel occlusion. 4. No acute intracranial abnormality. 5. 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 Electronically Signed   By: Marin Roberts M.D.   On: 05/31/2017 18:20   Ct Angio Chest/abd/pel For Dissection W And/or Wo Contrast  Result Date: 05/31/2017 CLINICAL DATA:  Intermittent chest pain for 2 weeks EXAM: CT ANGIOGRAPHY CHEST, ABDOMEN AND PELVIS TECHNIQUE: Multidetector CT imaging through the chest, abdomen and pelvis was performed using the standard protocol during bolus administration of intravenous contrast. Multiplanar reconstructed images and MIPs were obtained and reviewed to evaluate the vascular anatomy. CONTRAST:  ISOVUE-370 IOPAMIDOL (ISOVUE-370) INJECTION 76% COMPARISON:  None. FINDINGS: CTA CHEST FINDINGS Cardiovascular: A dissection involving the aortic arch and descending thoracic aorta is present consistent with a Stanford type B dissection. The dissection flap begins just beyond the origin of the left subclavian artery and extends to the mid descending thoracic aorta. The true lumen is the dominant component. The false lumen is somewhat spiral in configuration and primarily enhances. A tiny portion of the false lumen is thrombosed inferiorly. See image 40 of series 5. Great vessels are patent. Specifically, there is no evidence of extension of the dissection flap into the great vessels. Vertebral arteries are also patent. No obvious acute pulmonary  thromboembolism. Coronary arteries are grossly unremarkable. Mediastinum/Nodes: Small scattered mediastinal nodes. Unremarkable thyroid and esophagus. Physiologic pericardial fluid. Lungs/Pleura: No pneumothorax.  No pleural effusion. There is patchy airspace disease in the lingula. Musculoskeletal: There is no vertebral compression deformity. Review of the MIP images confirms the above findings. CTA ABDOMEN AND PELVIS FINDINGS VASCULAR Aorta: Mild atherosclerotic calcification. Ectasia of the distal abdominal aorta measures up to 3.5 cm in diameter. No evidence of dissection in the abdominal aorta. Celiac: Patent.  Branch vessels patent. SMA: Patent. Renals: Single renal arteries are patent. IMA: Significant narrowing at the origin is suspected. Branch vessels patent. Inflow: There are atherosclerotic calcifications involving the common iliac arteries. No significant narrowing. No evidence of aneurysm. No evidence of dissection. Internal and external iliac arteries are patent. Review of the MIP images confirms the above findings. NON-VASCULAR Hepatobiliary: Diffuse hepatic steatosis. Normal gallbladder. There is a benign-appearing cyst in the caudate lobe of the liver. Pancreas: Unremarkable Spleen: Unremarkable Adrenals/Urinary Tract: Unremarkable appearance of the kidneys. Right adrenal gland is unremarkable. 1.2 cm left adrenal nodule is nonspecific by imaging characteristics. Bladder is within normal limits. Stomach/Bowel: Stomach  is unremarkable and decompressed. Duodenum is within normal limits. No evidence of small-bowel obstruction. A portion of the transverse colon traverses into a ventral abdominal hernia. No evidence of colon obstruction. No obvious mass in the colon. Lymphatic: There is no evidence of abnormal retroperitoneal adenopathy. Reproductive: Uterus and adnexa are within normal limits. Other: There is no free fluid. A ventral abdominal hernia contains adipose tissue and transverse colon without  evidence of obstruction. Musculoskeletal: No vertebral compression deformity. There is degenerative disc disease at L4-5 and L5-S1. Lumbar facet arthropathy is most prominent at L3-4 and L4-5. Review of the MIP images confirms the above findings. 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 The dissection flap begins just beyond the origin of the left subclavian artery and extends to the mid descending thoracic aorta. The true lumen is dominant. A tiny portion of the false lumen is thrombosed. It largely opacifies with contrast. There is no evidence of extension into branch vessels. There is no evidence of extension below the diaphragm. Critical Value/emergent results were called by telephone at the time of interpretation on 05/31/2017 at 9:30 am to Dr. Milton Ferguson , who verbally acknowledged these results. Narrowing at the origin of the IMA. Nonvascular: Airspace disease in the lingula. Ventral hernia contains transverse colon. No evidence of obstruction. Benign cyst in the caudate lobe of the liver. 1.2 cm nonspecific left adrenal nodule. If there is a history or risk of malignancy, consider six-month follow-up MRI. Electronically Signed   By: Marybelle Killings M.D.   On: 05/31/2017 09:31    Cardiac Studies   2D echo 05/2017 Study Conclusions  - Left ventricle: The cavity size was normal. Wall thickness was   increased in a pattern of severe LVH. Systolic function was   normal. The estimated ejection fraction was in the range of 55%   to 60%. The study is not technically sufficient to allow   evaluation of LV diastolic function. - Left atrium: The atrium was severely dilated. - Right ventricle: The cavity size was mildly dilated. Wall   thickness was normal. - Right atrium: The atrium was severely dilated. - Pericardium, extracardiac: A small pericardial effusion was   identified.  Patient Profile     57 y.o. female with a history of  non-obstructive CAD per cath 2017, HTN, polysubstance abuse with ETOH/Cocaine/Marijuana and tobacco who presented to the ED on 05/31/17 with hypertensive urgency found to have type B aortic dissection per CT angiogram. Cards, TCTS, and vascular surgery consulted for management.    Assessment & Plan    1. Chest pain associated with acute type B aortic dissection per CT: -no chest pain since admit. -Type B aortic dissection involving the aortic arch and descending thoracic aorta is present per chest CT -BP much improved after starting Carvedilol  goal of SBP <120 and DBP <80 -Plan per vascular surgery, re-scan within I next 24 hours  2. Hypertensive urgency: -BP now normalized on IV Cardene gtt and Carvedilol -BP very well controlled overnight.  Elevated this am at 149/24mmHg. - will add back home dose of amlodipine 5 mg daily and try to wean off Cardene gtt  3. Acute diastolic heart failure: -2D echo with normal LVF -BNP on admission, >3,000 and down to 1459 yesterday -she put out 1950cc yesterday and is net positive 778cc. -Chest CT with patchy airspace disease -repeat cxray yesterday with lingular airspace opacities.  No edema -she appears euvolemic on exam today.  -creatinine improved  with IVFs to 1.63 from 2.09 -will stop IV lasix   4. Fever with elevated WBC: -Per PCCM, currently on IV abx -Remains febrile  5. Mild obstructive CAD: -Per cath 2017  6. AMS: -Resolved, likely r/t HTN urgency  -Precedex d/c'd  7. AKI: -Cr, 1.22>>2.09>>1.63 -Likely r/t severe HTN and contrast dye -improved with IVF   For questions or updates, please contact [facility] Please consult [email or web address] for contact info under Cardiology/STEMI.      Signed, Fransico Him, MD  06/02/2017, 7:49 AM

## 2017-06-03 ENCOUNTER — Inpatient Hospital Stay (HOSPITAL_COMMUNITY): Payer: BLUE CROSS/BLUE SHIELD

## 2017-06-03 DIAGNOSIS — I7102 Dissection of abdominal aorta: Secondary | ICD-10-CM

## 2017-06-03 LAB — BASIC METABOLIC PANEL
Anion gap: 9 (ref 5–15)
BUN: 17 mg/dL (ref 6–20)
CALCIUM: 8.3 mg/dL — AB (ref 8.9–10.3)
CO2: 25 mmol/L (ref 22–32)
CREATININE: 1.45 mg/dL — AB (ref 0.44–1.00)
Chloride: 103 mmol/L (ref 101–111)
GFR calc Af Amer: 46 mL/min — ABNORMAL LOW (ref >60)
GFR calc non Af Amer: 39 mL/min — ABNORMAL LOW (ref >60)
GLUCOSE: 110 mg/dL — AB (ref 65–99)
Potassium: 3.5 mmol/L (ref 3.5–5.1)
Sodium: 137 mmol/L (ref 135–145)

## 2017-06-03 LAB — TROPONIN I
TROPONIN I: 0.95 ng/mL — AB (ref <0.03)
TROPONIN I: 1.01 ng/mL — AB (ref <0.03)

## 2017-06-03 LAB — CBC
HEMATOCRIT: 32.2 % — AB (ref 36.0–46.0)
Hemoglobin: 10.5 g/dL — ABNORMAL LOW (ref 12.0–15.0)
MCH: 31.8 pg (ref 26.0–34.0)
MCHC: 32.6 g/dL (ref 30.0–36.0)
MCV: 97.6 fL (ref 78.0–100.0)
Platelets: 153 10*3/uL (ref 150–400)
RBC: 3.3 MIL/uL — ABNORMAL LOW (ref 3.87–5.11)
RDW: 16.2 % — AB (ref 11.5–15.5)
WBC: 2.9 10*3/uL — ABNORMAL LOW (ref 4.0–10.5)

## 2017-06-03 LAB — PROCALCITONIN: Procalcitonin: 1.86 ng/mL

## 2017-06-03 MED ORDER — HYDRALAZINE HCL 50 MG PO TABS
50.0000 mg | ORAL_TABLET | Freq: Four times a day (QID) | ORAL | Status: DC
  Administered 2017-06-03 – 2017-06-04 (×4): 50 mg via ORAL
  Filled 2017-06-03 (×4): qty 1

## 2017-06-03 MED ORDER — PANTOPRAZOLE SODIUM 40 MG IV SOLR
40.0000 mg | Freq: Two times a day (BID) | INTRAVENOUS | Status: DC
  Administered 2017-06-03: 40 mg via INTRAVENOUS
  Filled 2017-06-03: qty 40

## 2017-06-03 MED ORDER — HYDRALAZINE HCL 25 MG PO TABS: 25.0000 mg | ORAL_TABLET | Freq: Three times a day (TID) | ORAL | Status: DC

## 2017-06-03 MED ORDER — POTASSIUM CHLORIDE CRYS ER 20 MEQ PO TBCR
20.0000 meq | EXTENDED_RELEASE_TABLET | Freq: Once | ORAL | Status: AC
  Administered 2017-06-03: 20 meq via ORAL
  Filled 2017-06-03: qty 1

## 2017-06-03 MED ORDER — HYDRALAZINE HCL 20 MG/ML IJ SOLN
20.0000 mg | INTRAMUSCULAR | Status: DC | PRN
  Administered 2017-06-04 (×4): 20 mg via INTRAVENOUS
  Filled 2017-06-03 (×5): qty 1

## 2017-06-03 MED ORDER — HYDRALAZINE HCL 20 MG/ML IJ SOLN
10.0000 mg | Freq: Four times a day (QID) | INTRAMUSCULAR | Status: DC | PRN
Start: 2017-06-03 — End: 2017-06-03
  Administered 2017-06-03: 10 mg via INTRAVENOUS
  Filled 2017-06-03: qty 1

## 2017-06-03 MED ORDER — ALUM & MAG HYDROXIDE-SIMETH 200-200-20 MG/5ML PO SUSP
30.0000 mL | Freq: Four times a day (QID) | ORAL | Status: DC | PRN
  Administered 2017-06-03: 30 mL via ORAL
  Filled 2017-06-03: qty 30

## 2017-06-03 MED ORDER — CLONIDINE HCL 0.1 MG PO TABS: 0.1000 mg | ORAL_TABLET | Freq: Every day | ORAL | Status: DC

## 2017-06-03 MED ORDER — NICARDIPINE HCL IN NACL 20-0.86 MG/200ML-% IV SOLN
3.0000 mg/h | INTRAVENOUS | Status: DC
  Filled 2017-06-03: qty 200

## 2017-06-03 MED ORDER — PANTOPRAZOLE SODIUM 40 MG IV SOLR: 40.0000 mg | Freq: Two times a day (BID) | INTRAVENOUS | Status: DC

## 2017-06-03 MED ORDER — CHLORTHALIDONE 25 MG PO TABS
25.0000 mg | ORAL_TABLET | Freq: Every day | ORAL | Status: DC
  Administered 2017-06-04: 25 mg via ORAL
  Filled 2017-06-03 (×3): qty 1

## 2017-06-03 MED ORDER — HYDRALAZINE HCL 20 MG/ML IJ SOLN
20.0000 mg | Freq: Four times a day (QID) | INTRAMUSCULAR | Status: DC | PRN
  Administered 2017-06-03: 20 mg via INTRAVENOUS
  Filled 2017-06-03: qty 1

## 2017-06-03 NOTE — Progress Notes (Signed)
Received patient from [facility]. Vitals taken and stable. CCMD notified and confirmed. Patient educated on equip in room, white board. Patient acknowledged understanding.

## 2017-06-03 NOTE — Progress Notes (Addendum)
PROGRESS NOTE    Dorothy Harris  WNU:[id number] DOB: [birth year] DOA: 05/31/2017 PCP: Patient, No Pcp Per   Brief Narrative: 57yo female with hx HTN, cocaine abuse presented 2/24 with chest pain, AMS, HTN crisis. ER w/u included EKG without ischemic changes, Flu POS, CTA chest which revealed type B dissection. SHe was started on cardene gtt for aggressive BP management     Assessment & Plan:   Principal Problem:   Aortic dissection distal to left subclavian [facility]) Active Problems:   Hypertensive crisis   Descending thoracic dissection (HCC)   Chest pain   Aortic dissection, thoracic (HCC)   Coronary artery disease due to lipid rich plaque   Acute diastolic heart failure (HCC)   Influenza, pneumonia  Thoracic aortic type B Dissection; and subclavian artery dissection Needs better BP controlled.  Evaluated vascular, plan to repeat CTA when renal function recovers and prior to discharge.  Evaluated by CVTS who recommend blood  Pressure controlled.  Will start oral hydralazine.   HTN Urgency;  Was on cardine Gtt/  Continue with Norvasc, coreg.  UDS negative.  She will received oral Norvasc now. I will start oral hydralazine.  Will add PRN hydralazine .  Addendum; 2;20 pm Hydralazine increase to 50 mg, received first dose at 12 noon.  SBP in the 159 range. Will received PRN hydralazine. . Monitor BP closely, might need to betransfer to ICU if no improvement.  BP now in the 128 range.  EKG sinus rhythm. Troponin at 0.95, nurse will inform cardiology.   Abdominal pain, Dyspnea.  Will check KUB. Cycle enzymes.  IV protonix.  EKG.  Will inform cardiology. Informed CVTS.  Will contact Dr Donzetta Matters.   CAP, influenza A  Continue with tamiflu.  Continue with ceftriaxone and azithromycin for PNA.   Anemia; monitor hb. Decreased today to 10.   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   Acute diastolic HF exacerbation;  BNP 3,000.  IV lasix stop diuet to AKI.    Cardiology following.   AKI; improved with IV fluids. Cr peak to 2.09. It has decreased today to 1.4  Leukopenia; monitor. HIV negative    DVT prophylaxis:  Heparin  Code Status: full code.  Family Communication: care discussed with patient.  Disposition Plan: Remain in the step down unit, close monitoring of BP  Consultants:   CVTS  Cardiology   Procedures: ECHO;   Antimicrobials: Ceftriaxone 2-24 Azithromycin 2-24  Subjective: She Is alert, denies chest pain or dyspnea. Cough productive. Chest pain when she cough.   Objective: Vitals:   06/02/17 1900 06/02/17 2000 06/02/17 2125 06/03/17 0345  BP: (!) 144/86  (!) 148/90 (!) 156/101  Pulse: (!) 55  (!) 58 66  Resp: (!) $RemoveB'23  16 16  'pnwAmrIf$ Temp:  99.4 F (37.4 C) 98.2 F (36.8 C) 98.3 F (36.8 C)  TempSrc:  Oral Oral Oral  SpO2: 97%  100% 99%  Weight:    90.4 kg (199 lb 6.4 oz)  Height:   '5\' 7"'$  (1.702 m)     Intake/Output Summary (Last 24 hours) at 06/03/2017 0826 Last data filed at 06/03/2017 0350 Gross per 24 hour  Intake 1172.92 ml  Output 1375 ml  Net -202.08 ml   Filed Weights   06/01/17 0500 06/02/17 0500 06/03/17 0345  Weight: 87.5 kg (192 lb 14.4 oz) 79.5 kg (175 lb 4.3 oz) 90.4 kg (199 lb 6.4 oz)    Examination:  General exam: Appears calm and comfortable  Respiratory system: Clear  to auscultation. Respiratory effort normal. Cardiovascular system: S1 & S2 heard, RRR. No JVD, murmurs, rubs, gallops or clicks. No pedal edema. Gastrointestinal system: Abdomen is nondistended, soft and nontender. No organomegaly or masses felt. Normal bowel sounds heard. Central nervous system: Alert and oriented. No focal neurological deficits. Extremities: Symmetric 5 x 5 power. Skin: No rashes, lesions or ulcers     Data Reviewed: I have personally reviewed following labs and imaging studies  CBC: Recent Labs  Lab 05/31/17 0624 05/31/17 1252 06/01/17 0226 06/02/17 0415 06/03/17 0233  WBC 8.7 12.5* 10.0 4.0  2.9*  NEUTROABS  --  11.5*  --   --   --   HGB 13.3 12.2 11.5* 11.1* 10.5*  HCT 41.2 37.9 36.5 34.5* 32.2*  MCV 96.7 97.2 98.4 96.9 97.6  PLT 190 176 154 158 409   Basic Metabolic Panel: Recent Labs  Lab 05/31/17 0635 06/01/17 0226 06/02/17 0415 06/03/17 0233  NA 140 140 135 137  K 3.6 3.7 3.2* 3.5  CL 101 102 101 103  CO2 $Re'27 23 23 25  'fot$ GLUCOSE 137* 94 115* 110*  BUN 16 24* 22* 17  CREATININE 1.22* 2.09* 1.63* 1.45*  CALCIUM 9.3 8.1* 8.0* 8.3*  MG  --   --  2.0  --   PHOS  --   --  3.7  --    GFR: Estimated Creatinine Clearance: 50 mL/min (A) (by C-G formula based on SCr of 1.45 mg/dL (H)). Liver Function Tests: Recent Labs  Lab 05/31/17 0635  AST 36  ALT 48  ALKPHOS 99  BILITOT 1.5*  PROT 7.9  ALBUMIN 4.4   No results for input(s): LIPASE, AMYLASE in the last 168 hours. No results for input(s): AMMONIA in the last 168 hours. Coagulation Profile: No results for input(s): INR, PROTIME in the last 168 hours. Cardiac Enzymes: No results for input(s): CKTOTAL, CKMB, CKMBINDEX, TROPONINI in the last 168 hours. BNP (last 3 results) No results for input(s): PROBNP in the last 8760 hours. HbA1C: No results for input(s): HGBA1C in the last 72 hours. CBG: Recent Labs  Lab 05/31/17 1155 06/01/17 0718  GLUCAP 143* 112*   Lipid Profile: No results for input(s): CHOL, HDL, LDLCALC, TRIG, CHOLHDL, LDLDIRECT in the last 72 hours. Thyroid Function Tests: No results for input(s): TSH, T4TOTAL, FREET4, T3FREE, THYROIDAB in the last 72 hours. Anemia Panel: No results for input(s): VITAMINB12, FOLATE, FERRITIN, TIBC, IRON, RETICCTPCT in the last 72 hours. Sepsis Labs: Recent Labs  Lab 05/31/17 1252 06/01/17 0226 06/02/17 0415 06/03/17 0233  PROCALCITON 2.10 5.42 3.23 1.86    Recent Results (from the past 240 hour(s))  Respiratory Panel by PCR     Status: Abnormal   Collection Time: 05/31/17 12:00 PM  Result Value Ref Range Status   Adenovirus NOT DETECTED NOT  DETECTED Final   Coronavirus 229E NOT DETECTED NOT DETECTED Final   Coronavirus HKU1 NOT DETECTED NOT DETECTED Final   Coronavirus NL63 NOT DETECTED NOT DETECTED Final   Coronavirus OC43 NOT DETECTED NOT DETECTED Final   Metapneumovirus NOT DETECTED NOT DETECTED Final   Rhinovirus / Enterovirus NOT DETECTED NOT DETECTED Final   Influenza A H1 2009 DETECTED (A) NOT DETECTED Final   Influenza B NOT DETECTED NOT DETECTED Final   Parainfluenza Virus 1 NOT DETECTED NOT DETECTED Final   Parainfluenza Virus 2 NOT DETECTED NOT DETECTED Final   Parainfluenza Virus 3 NOT DETECTED NOT DETECTED Final   Parainfluenza Virus 4 NOT DETECTED NOT DETECTED Final   Respiratory  Syncytial Virus NOT DETECTED NOT DETECTED Final   Bordetella pertussis NOT DETECTED NOT DETECTED Final   Chlamydophila pneumoniae NOT DETECTED NOT DETECTED Final   Mycoplasma pneumoniae NOT DETECTED NOT DETECTED Final    Comment: Performed at [facility], [place] [address], [place], [place] [address]  MRSA PCR Screening     Status: None   Collection Time: 05/31/17 12:00 PM  Result Value Ref Range Status   MRSA by PCR NEGATIVE NEGATIVE Final    Comment:        The GeneXpert MRSA Assay (FDA approved for NASAL specimens only), is one component of a comprehensive MRSA colonization surveillance program. It is not intended to diagnose MRSA infection nor to guide or monitor treatment for MRSA infections. Performed at [facility], [place] [address] [place], [place] [address]   Culture, blood (routine x 2)     Status: None (Preliminary result)   Collection Time: 05/31/17 12:55 PM  Result Value Ref Range Status   Specimen Description BLOOD RIGHT HAND  Final   Special Requests IN PEDIATRIC BOTTLE Blood Culture adequate volume  Final   Culture   Final    NO GROWTH 2 DAYS Performed at [facility], [place] [address] [place], [place] [address]    Report Status PENDING  Incomplete  Culture, blood (routine x 2)     Status:  None (Preliminary result)   Collection Time: 05/31/17 12:59 PM  Result Value Ref Range Status   Specimen Description BLOOD RIGHT HAND  Final   Special Requests IN PEDIATRIC BOTTLE Blood Culture adequate volume  Final   Culture   Final    NO GROWTH 2 DAYS Performed at [facility], [place] [address], [place], [place] [address]    Report Status PENDING  Incomplete         Radiology Studies: Dg Chest 1 View  Result Date: 06/01/2017 CLINICAL DATA:  CHF. EXAM: CHEST 1 VIEW COMPARISON:  Chest x-ray dated May 31, 2017. FINDINGS: Stable cardiomegaly. Normal pulmonary vascularity. Unchanged patchy opacities in the lingula. No pleural effusion or the mid pneumothorax. No acute osseous abnormality. IMPRESSION: Unchanged lingular airspace disease. Electronically Signed   By: Titus Dubin M.D.   On: 06/01/2017 11:29        Scheduled Meds: . amLODipine  10 mg Oral Daily  . carvedilol  12.5 mg Oral BID WC  . heparin  5,000 Units Subcutaneous Q8H  . Influenza vac split quadrivalent PF  0.5 mL Intramuscular Tomorrow-1000  . mouth rinse  15 mL Mouth Rinse BID  . oseltamivir  30 mg Oral BID  . thiamine injection  100 mg Intravenous Daily   Continuous Infusions: . sodium chloride Stopped (05/31/17 1600)  . azithromycin Stopped (06/02/17 1309)  . cefTRIAXone (ROCEPHIN)  IV Stopped (06/02/17 1309)     LOS: 3 days    Time spent: 35 minutes.     Elmarie Shiley, MD [facility] Pager [phone]  If 7PM-7AM, please contact night-coverage [email or web address] Password [facility] 06/03/2017, 8:26 AM

## 2017-06-03 NOTE — Progress Notes (Signed)
  Progress Note    06/03/2017 8:08 AM * No surgery found *  Subjective:  No overnight issues  Vitals:   06/02/17 2125 06/03/17 0345  BP: (!) 148/90 (!) 156/101  Pulse: (!) 58 66  Resp: 16 16  Temp: 98.2 F (36.8 C) 98.3 F (36.8 C)  SpO2: 100% 99%    Physical Exam: aaox3 Abdomen is soft with large midline hernia Palpable femoral pulses bilaterally Feet are warm  CBC    Component Value Date/Time   WBC 2.9 (L) 06/03/2017 0233   RBC 3.30 (L) 06/03/2017 0233   HGB 10.5 (L) 06/03/2017 0233   HCT 32.2 (L) 06/03/2017 0233   PLT 153 06/03/2017 0233   MCV 97.6 06/03/2017 0233   MCH 31.8 06/03/2017 0233   MCHC 32.6 06/03/2017 0233   RDW 16.2 (H) 06/03/2017 0233   LYMPHSABS 0.1 (L) 05/31/2017 1252   MONOABS 0.9 05/31/2017 1252   EOSABS 0.0 05/31/2017 1252   BASOSABS 0.0 05/31/2017 1252    BMET    Component Value Date/Time   NA 137 06/03/2017 0233   K 3.5 06/03/2017 0233   CL 103 06/03/2017 0233   CO2 25 06/03/2017 0233   GLUCOSE 110 (H) 06/03/2017 0233   BUN 17 06/03/2017 0233   CREATININE 1.45 (H) 06/03/2017 0233   CALCIUM 8.3 (L) 06/03/2017 0233   GFRNONAA 39 (L) 06/03/2017 0233   GFRAA 46 (L) 06/03/2017 0233    INR    Component Value Date/Time   INR 1.04 12/23/2015 1040     Intake/Output Summary (Last 24 hours) at 06/03/2017 4327 Last data filed at 06/03/2017 0350 Gross per 24 hour  Intake 1172.92 ml  Output 1375 ml  Net -202.08 ml     Assessment:  57 y.o. female is here with type b aortic dissection  Plan: Activity as tolerated  bp remains elevated and will need tighter control prior to dc Will plan repeat CTA dissection protocol when AKI fully resolved and prior to [place]. Donzetta Matters, MD [facility] Office: [phone] Pager: [phone]  06/03/2017 8:08 AM

## 2017-06-03 NOTE — Progress Notes (Addendum)
Progress Note  Patient Name: Dorothy Harris Date of Encounter: 06/03/2017  Primary Cardiologist: Fransico Him, MD   Subjective   Breathing some better, no CP  Inpatient Medications    Scheduled Meds: . amLODipine  10 mg Oral Daily  . carvedilol  12.5 mg Oral BID WC  . heparin  5,000 Units Subcutaneous Q8H  . hydrALAZINE  25 mg Oral Q8H  . Influenza vac split quadrivalent PF  0.5 mL Intramuscular Tomorrow-1000  . mouth rinse  15 mL Mouth Rinse BID  . oseltamivir  30 mg Oral BID  . thiamine injection  100 mg Intravenous Daily   Continuous Infusions: . sodium chloride Stopped (05/31/17 1600)  . azithromycin Stopped (06/02/17 1309)  . cefTRIAXone (ROCEPHIN)  IV Stopped (06/02/17 1309)   PRN Meds: sodium chloride, acetaminophen, acetaminophen, albuterol, hydrALAZINE, labetalol, morphine injection, ondansetron (ZOFRAN) IV   Vital Signs    Vitals:   06/02/17 2000 06/02/17 2125 06/03/17 0345 06/03/17 0828  BP:  (!) 148/90 (!) 156/101 (!) 187/109  Pulse:  (!) 58 66 61  Resp:  16 16   Temp: 99.4 F (37.4 C) 98.2 F (36.8 C) 98.3 F (36.8 C)   TempSrc: Oral Oral Oral   SpO2:  100% 99%   Weight:   199 lb 6.4 oz (90.4 kg)   Height:  $Remove'5\' 7"'RXLTCxv$  (1.702 m)      Intake/Output Summary (Last 24 hours) at 06/03/2017 0850 Last data filed at 06/03/2017 0350 Gross per 24 hour  Intake 1172.92 ml  Output 1375 ml  Net -202.08 ml   Filed Weights   06/01/17 0500 06/02/17 0500 06/03/17 0345  Weight: 192 lb 14.4 oz (87.5 kg) 175 lb 4.3 oz (79.5 kg) 199 lb 6.4 oz (90.4 kg)    Telemetry    NSR - Personally Reviewed  ECG    No new EKG to review - Personally Reviewed  Physical Exam   GEN: No acute distress.   Neck: No JVD Cardiac: RRR, no murmurs, rubs, or gallops.  Respiratory: scattered rales bilaterally, slight wheeze GI: Soft, nontender, non-distended  MS: No edema; No deformity. Neuro:  Nonfocal  Psych: Normal affect   Labs    Chemistry Recent Labs  Lab 05/31/17 0635  06/01/17 0226 06/02/17 0415 06/03/17 0233  NA 140 140 135 137  K 3.6 3.7 3.2* 3.5  CL 101 102 101 103  CO2 $Re'27 23 23 25  'oXt$ GLUCOSE 137* 94 115* 110*  BUN 16 24* 22* 17  CREATININE 1.22* 2.09* 1.63* 1.45*  CALCIUM 9.3 8.1* 8.0* 8.3*  PROT 7.9  --   --   --   ALBUMIN 4.4  --   --   --   AST 36  --   --   --   ALT 48  --   --   --   ALKPHOS 99  --   --   --   BILITOT 1.5*  --   --   --   GFRNONAA 49* 25* 34* 39*  GFRAA 56* 29* 40* 46*  ANIONGAP $RemoveB'12 15 11 9     'OLhwMFWy$ Hematology Recent Labs  Lab 06/01/17 0226 06/02/17 0415 06/03/17 0233  WBC 10.0 4.0 2.9*  RBC 3.71* 3.56* 3.30*  HGB 11.5* 11.1* 10.5*  HCT 36.5 34.5* 32.2*  MCV 98.4 96.9 97.6  MCH 31.0 31.2 31.8  MCHC 31.5 32.2 32.6  RDW 16.1* 15.9* 16.2*  PLT 154 158 153    Cardiac Enzymes  Recent Labs  Lab 05/31/17 [phone]  TROPIPOC 0.02     BNP Recent Labs  Lab 05/31/17 0635 06/01/17 1112  BNP 3,012.0* 1,459.3*     Radiology    Dg Chest 1 View  Result Date: 06/01/2017 CLINICAL DATA:  CHF. EXAM: CHEST 1 VIEW COMPARISON:  Chest x-ray dated May 31, 2017. FINDINGS: Stable cardiomegaly. Normal pulmonary vascularity. Unchanged patchy opacities in the lingula. No pleural effusion or the mid pneumothorax. No acute osseous abnormality. IMPRESSION: Unchanged lingular airspace disease. Electronically Signed   By: Titus Dubin M.D.   On: 06/01/2017 11:29    Cardiac Studies   2D echo 05/2017 Study Conclusions  - Left ventricle: The cavity size was normal. Wall thickness was   increased in a pattern of severe LVH. Systolic function was   normal. The estimated ejection fraction was in the range of 55%   to 60%. The study is not technically sufficient to allow   evaluation of LV diastolic function. - Left atrium: The atrium was severely dilated. - Right ventricle: The cavity size was mildly dilated. Wall   thickness was normal. - Right atrium: The atrium was severely dilated. - Pericardium, extracardiac: A small  pericardial effusion was   identified.  Patient Profile     57 y.o. female with a history of non-obstructive CAD per cath 2017, HTN, polysubstance abuse with ETOH/Cocaine/Marijuana and tobacco who presented to the ED on 05/31/17 with hypertensive urgency found to have type B aortic dissection per CT angiogram. Cards, TCTS, and vascular surgery consulted for management.    Assessment & Plan    1. Chest pain associated with acute type B aortic dissection per CT: -no chest pain since admit. -Type B aortic dissection involving the aortic arch and descending thoracic aorta is present per chest CT -BP improved after starting Carvedilol and amlodipine, but not controlled  goal of SBP <120 and DBP <80 -Plan per vascular surgery   2. Hypertensive urgency: -BP was initially on IV Cardene gtt, off after po rx started -  Elevated this am, 180/109 - increase hydralazine to 50 mg q 6 h and increase amlodipine to 10 mg qd  3. Acute diastolic heart failure: -2D echo with normal LVF -BNP on admission, >3,000 and down to 1459 02/25 -she put out 1950cc yesterday and is net positive 770cc. -Chest CT with patchy airspace disease -repeat cxray 02/25 with lingular airspace opacities.  No edema -she appears euvolemic on exam today.  -creatinine improved with IVFs to 1.45 from 2.09 - off Lasix   4. Fever with elevated WBC: -Per PCCM, currently on IV abx -Remains febrile - per IM  5. Mild obstructive CAD: -Per cath 2017  6. AMS: -Resolved, likely r/t HTN urgency  -Precedex d/c'd  7. AKI: -Cr, 1.22>>2.09>>1.63>>1.45 -Likely r/t severe HTN and contrast dye -improved with IVF   For questions or updates, please contact [place] Please consult [email or web address] for contact info under Cardiology/STEMI.      Signed, Rosaria Ferries, PA-C  06/03/2017, 8:50 AM

## 2017-06-03 NOTE — Progress Notes (Signed)
      [phone]       [place] [address]             [phone]     CARDIOTHORACIC SURGERY PROGRESS NOTE  Subjective: No complaints.    Objective: Vital signs in last 24 hours: Temp:  [98.2 F (36.8 C)-99.6 F (37.6 C)] 98.3 F (36.8 C) (02/27 0345) Pulse Rate:  [55-70] 61 (02/27 0828) Cardiac Rhythm: Normal sinus rhythm (02/27 0701) Resp:  [16-25] 16 (02/27 0345) BP: (122-187)/(72-109) 187/109 (02/27 0908) SpO2:  [88 %-100 %] 99 % (02/27 0345) Weight:  [199 lb 6.4 oz (90.4 kg)] 199 lb 6.4 oz (90.4 kg) (02/27 0345)  Physical Exam:  Rhythm:   sinus  Breath sounds: coarse  Heart sounds:  RRR  Incisions:  n/a  Abdomen:  soft  Extremities:  warm   Intake/Output from previous day: 02/26 0701 - 02/27 0700 In: 1586.7 [P.O.:480; I.V.:406.7; IV Piggyback:700] Out: 1550 [Urine:1550] Intake/Output this shift: No intake/output data recorded.  Lab Results: Recent Labs    06/02/17 0415 06/03/17 0233  WBC 4.0 2.9*  HGB 11.1* 10.5*  HCT 34.5* 32.2*  PLT 158 153   BMET:  Recent Labs    06/02/17 0415 06/03/17 0233  NA 135 137  K 3.2* 3.5  CL 101 103  CO2 23 25  GLUCOSE 115* 110*  BUN 22* 17  CREATININE 1.63* 1.45*  CALCIUM 8.0* 8.3*    CBG (last 3)  Recent Labs    05/31/17 1155 06/01/17 0718  GLUCAP 143* 112*   PT/INR:  No results for input(s): LABPROT, INR in the last 72 hours.  CXR:  N/A  Assessment/Plan:  Blood pressure remains way too high, approaching dangerous levels for a patient with an aortic dissection.  Would consider return to ICU if better control can't be established soon.  Both hydralazine and labetalol are available as needed.  Will defer further adjustments to oral meds to the cardiology team.  Will continue to follow intermittently, but unless f/u CTA reveals proximal extension of the aortic dissection we will defer long term follow up to Dr Donzetta Matters and his colleagues at [facility].  I have attempted to explain the circumstances  regarding the patient's aortic dissection and her severe hypertension to the patient.  Implications for long term management and prognosis were discussed.  She seems to have very limited understanding of her medical conditions.   I spent in excess of 15 minutes during the conduct of this hospital encounter and >50% of this time involved direct face-to-face encounter with the patient for counseling and/or coordination of their care.    Rexene Alberts, MD 06/03/2017 9:49 AM

## 2017-06-03 NOTE — Progress Notes (Signed)
Page placed to Dr. Tyrell Antonio with results of Troponin, 0.95.

## 2017-06-03 NOTE — Progress Notes (Signed)
    Called with patient's initial trop pf 0.95. EKG at last check 1826 SR without acute ischemia. Patient actually feeling better this evening than earlier this morning. This is in the setting of hypertensive urgency and Type B aortic dissection.  -- continue to cycle trops, defer adding heparin at this time -- recheck EKG in the am  Discussed with attending Dr. Meda Coffee.  SignedReino Bellis, NP-C 06/03/2017, 6:58 PM Pager: [phone]

## 2017-06-04 LAB — BASIC METABOLIC PANEL
Anion gap: 12 (ref 5–15)
BUN: 13 mg/dL (ref 6–20)
CHLORIDE: 103 mmol/L (ref 101–111)
CO2: 20 mmol/L — AB (ref 22–32)
CREATININE: 1.32 mg/dL — AB (ref 0.44–1.00)
Calcium: 8.2 mg/dL — ABNORMAL LOW (ref 8.9–10.3)
GFR calc Af Amer: 51 mL/min — ABNORMAL LOW (ref >60)
GFR calc non Af Amer: 44 mL/min — ABNORMAL LOW (ref >60)
Glucose, Bld: 114 mg/dL — ABNORMAL HIGH (ref 65–99)
Potassium: 3.6 mmol/L (ref 3.5–5.1)
SODIUM: 135 mmol/L (ref 135–145)

## 2017-06-04 LAB — TROPONIN I: TROPONIN I: 0.88 ng/mL — AB (ref <0.03)

## 2017-06-04 LAB — POCT I-STAT 3, ART BLOOD GAS (G3+)
ACID-BASE EXCESS: 1 mmol/L (ref 0.0–2.0)
Bicarbonate: 26.8 mmol/L (ref 20.0–28.0)
O2 SAT: 92 %
PH ART: 7.348 — AB (ref 7.350–7.450)
PO2 ART: 75 mmHg — AB (ref 83.0–108.0)
Patient temperature: 102.6
TCO2: 28 mmol/L (ref 22–32)
pCO2 arterial: 49.8 mmHg — ABNORMAL HIGH (ref 32.0–48.0)

## 2017-06-04 LAB — DRUG PROFILE, UR, 9 DRUGS (LABCORP)
Amphetamines, Urine: NEGATIVE ng/mL
BENZODIAZEPINE QUANT UR: NEGATIVE ng/mL
Barbiturate, Ur: NEGATIVE ng/mL
CANNABINOID QUANT UR: NEGATIVE ng/mL
COCAINE (METAB.): NEGATIVE ng/mL
METHADONE SCREEN, URINE: NEGATIVE ng/mL
OPIATE QUANT UR: POSITIVE — AB
PHENCYCLIDINE, UR: NEGATIVE ng/mL
Propoxyphene, Urine: NEGATIVE ng/mL

## 2017-06-04 LAB — CBC
HCT: 35.7 % — ABNORMAL LOW (ref 36.0–46.0)
HEMOGLOBIN: 11.6 g/dL — AB (ref 12.0–15.0)
MCH: 31.9 pg (ref 26.0–34.0)
MCHC: 32.5 g/dL (ref 30.0–36.0)
MCV: 98.1 fL (ref 78.0–100.0)
Platelets: 160 10*3/uL (ref 150–400)
RBC: 3.64 MIL/uL — ABNORMAL LOW (ref 3.87–5.11)
RDW: 15.7 % — ABNORMAL HIGH (ref 11.5–15.5)
WBC: 3.2 10*3/uL — ABNORMAL LOW (ref 4.0–10.5)

## 2017-06-04 LAB — PROCALCITONIN: Procalcitonin: 0.82 ng/mL

## 2017-06-04 MED ORDER — VITAMIN B-1 100 MG PO TABS
100.0000 mg | ORAL_TABLET | Freq: Every day | ORAL | Status: DC
  Administered 2017-06-04 – 2017-06-15 (×12): 100 mg via ORAL
  Filled 2017-06-04 (×12): qty 1

## 2017-06-04 MED ORDER — CLONIDINE HCL 0.1 MG PO TABS
0.1000 mg | ORAL_TABLET | Freq: Every day | ORAL | Status: DC
  Administered 2017-06-04: 0.1 mg via ORAL
  Filled 2017-06-04: qty 1

## 2017-06-04 MED ORDER — LABETALOL HCL 5 MG/ML IV SOLN
10.0000 mg | INTRAVENOUS | Status: DC | PRN
  Administered 2017-06-05 (×3): 20 mg via INTRAVENOUS
  Administered 2017-06-06: 10 mg via INTRAVENOUS
  Administered 2017-06-06: 20 mg via INTRAVENOUS
  Filled 2017-06-04 (×5): qty 4

## 2017-06-04 MED ORDER — PANTOPRAZOLE SODIUM 40 MG PO TBEC
40.0000 mg | DELAYED_RELEASE_TABLET | Freq: Two times a day (BID) | ORAL | Status: DC
  Administered 2017-06-04 – 2017-06-15 (×23): 40 mg via ORAL
  Filled 2017-06-04 (×23): qty 1

## 2017-06-04 MED ORDER — HYDRALAZINE HCL 50 MG PO TABS
75.0000 mg | ORAL_TABLET | Freq: Four times a day (QID) | ORAL | Status: DC
  Administered 2017-06-04 – 2017-06-05 (×4): 75 mg via ORAL
  Filled 2017-06-04 (×4): qty 1

## 2017-06-04 MED ORDER — NICARDIPINE HCL IN NACL 20-0.86 MG/200ML-% IV SOLN
3.0000 mg/h | INTRAVENOUS | Status: DC
  Administered 2017-06-05: 3 mg/h via INTRAVENOUS
  Administered 2017-06-05: 9.5 mg/h via INTRAVENOUS
  Administered 2017-06-06: 5 mg/h via INTRAVENOUS
  Administered 2017-06-06: 7 mg/h via INTRAVENOUS
  Administered 2017-06-06: 5 mg/h via INTRAVENOUS
  Administered 2017-06-07: 3 mg/h via INTRAVENOUS
  Administered 2017-06-07 (×2): 5 mg/h via INTRAVENOUS
  Administered 2017-06-08: 3 mg/h via INTRAVENOUS
  Administered 2017-06-08: 5 mg/h via INTRAVENOUS
  Filled 2017-06-04 (×13): qty 200

## 2017-06-04 MED ORDER — CARVEDILOL 12.5 MG PO TABS
12.5000 mg | ORAL_TABLET | Freq: Once | ORAL | Status: AC
  Administered 2017-06-04: 12.5 mg via ORAL
  Filled 2017-06-04: qty 1

## 2017-06-04 MED ORDER — HYDRALAZINE HCL 20 MG/ML IJ SOLN
20.0000 mg | INTRAMUSCULAR | Status: DC | PRN
  Administered 2017-06-05 – 2017-06-09 (×8): 20 mg via INTRAVENOUS
  Filled 2017-06-04 (×9): qty 1

## 2017-06-04 MED ORDER — AZITHROMYCIN 500 MG PO TABS
500.0000 mg | ORAL_TABLET | Freq: Every day | ORAL | Status: DC
  Administered 2017-06-04 – 2017-06-07 (×4): 500 mg via ORAL
  Filled 2017-06-04 (×4): qty 1

## 2017-06-04 MED ORDER — CARVEDILOL 25 MG PO TABS
25.0000 mg | ORAL_TABLET | Freq: Two times a day (BID) | ORAL | Status: DC
  Administered 2017-06-04 – 2017-06-15 (×20): 25 mg via ORAL
  Filled 2017-06-04 (×21): qty 1

## 2017-06-04 NOTE — Progress Notes (Signed)
I spoke with Dr.Croitoru for clarification of blood pressure orders. He changed the PRN Labetalol order to be given if SBP greater than 130. He wants to hold off on starting the Cardene drip for now. Start Cardene drip if and when her SBP is greater than 130 thirty minutes after given PRN Hydralazine and Labetalol.

## 2017-06-04 NOTE — Progress Notes (Signed)
  Progress Note    06/04/2017 8:00 AM * No surgery found *  Subjective:  No pain  Vitals:   06/04/17 0611 06/04/17 0700  BP: (!) 163/89 (!) 155/93  Pulse:    Resp:  20  Temp:    SpO2:      Physical Exam: aaox3 No labored respirations Abdomen is soft Palpable femoral pulses bilaterally   CBC    Component Value Date/Time   WBC 3.2 (L) 06/04/2017 0312   RBC 3.64 (L) 06/04/2017 0312   HGB 11.6 (L) 06/04/2017 0312   HCT 35.7 (L) 06/04/2017 0312   PLT 160 06/04/2017 0312   MCV 98.1 06/04/2017 0312   MCH 31.9 06/04/2017 0312   MCHC 32.5 06/04/2017 0312   RDW 15.7 (H) 06/04/2017 0312   LYMPHSABS 0.1 (L) 05/31/2017 1252   MONOABS 0.9 05/31/2017 1252   EOSABS 0.0 05/31/2017 1252   BASOSABS 0.0 05/31/2017 1252    BMET    Component Value Date/Time   NA 135 06/04/2017 0312   K 3.6 06/04/2017 0312   CL 103 06/04/2017 0312   CO2 20 (L) 06/04/2017 0312   GLUCOSE 114 (H) 06/04/2017 0312   BUN 13 06/04/2017 0312   CREATININE 1.32 (H) 06/04/2017 0312   CALCIUM 8.2 (L) 06/04/2017 0312   GFRNONAA 44 (L) 06/04/2017 0312   GFRAA 51 (L) 06/04/2017 0312    INR    Component Value Date/Time   INR 1.04 12/23/2015 1040     Intake/Output Summary (Last 24 hours) at 06/04/2017 0800 Last data filed at 06/04/2017 0446 Gross per 24 hour  Intake 540 ml  Output 960 ml  Net -420 ml     Assessment:  57 y.o. female is here with tbad, bp uncontrolled  Plan: Needs tighter bp control Will plan repeat imaging when Cr normalizes   Kaely Hollan C. Donzetta Matters, MD [facility] [place] Office: [phone] Pager: [phone]  06/04/2017 8:00 AM

## 2017-06-04 NOTE — Progress Notes (Signed)
Patients' aunt called to check on her, updated her since she had patient password. Also gave her the patients' phone number to call her in the room.

## 2017-06-04 NOTE — Progress Notes (Signed)
PROGRESS NOTE    Dorothy Harris  IOX:[id number] DOB: 08-21-1960 DOA: 05/31/2017 PCP: Patient, No Pcp Per   Brief Narrative: 57yo female with hx HTN, cocaine abuse presented 2/24 with chest pain, AMS, HTN crisis. ER w/u included EKG without ischemic changes, Flu POS, CTA chest which revealed type B dissection. SHe was started on cardene gtt for aggressive BP management     Assessment & Plan:   Principal Problem:   Aortic dissection distal to left subclavian [facility]) Active Problems:   Hypertensive crisis   Descending thoracic dissection (HCC)   Chest pain   Aortic dissection, thoracic (HCC)   Coronary artery disease due to lipid rich plaque   Acute diastolic heart failure (HCC)   Influenza, pneumonia  Thoracic aortic type B Dissection; and subclavian artery dissection Needs better BP controlled.  Evaluated vascular, plan to repeat CTA when renal function recovers and prior to discharge.  Evaluated by CVTS who recommend blood  Pressure controlled.  Patient denies chest pain or dyspnea.  Started on clonidine today and chorthalidone. Didn't received dose of chlorthalidone yesterday. Hydralazine increase to 75 mg every 6 hours. Plan to transfer to ICU for Cardene gtt to archived BP controlled while oral medications start working.   HTN Urgency;  Continue with Norvasc, coreg, increased dose today.  UDS negative.  PRN hydralazine . IV labetalol./  Hydralazine increase to 75  mg Stared clonidine today,. Also will received chlorthalidone.   Abdominal pain, Dyspnea.  KUB negative. Chest x ray no significant edema.  Troponin elevated, in setting HTN urgency. Cardiology following.   CAP, influenza A  Continue with tamiflu.  Continue with ceftriaxone and azithromycin for PNA.  Day 5/7  Anemia; monitor hb. Hb stable at 11.   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   Acute diastolic HF exacerbation;  BNP 3,000.  IV lasix stop diuet to AKI.  Cardiology following.    AKI; improved with IV fluids. Cr peak to 2.09. It has decreased today to 1.4 Stable.  Leukopenia; monitor. HIV negative  Wbc increase to 3.2.    DVT prophylaxis:  Heparin  Code Status: full code.  Family Communication: care discussed with patient.  Disposition Plan: Transfer to ICU.   Consultants:   CVTS  Cardiology   Procedures: ECHO;   Antimicrobials: Ceftriaxone 2-24 Azithromycin 2-24  Subjective: She is feeling better today, denies chest pain, dyspnea  Or epigastric pain.   Objective: Vitals:   06/04/17 0700 06/04/17 0800 06/04/17 0826 06/04/17 0917  BP: (!) 155/93 (!) 169/92 (!) 178/93 (!) 150/83  Pulse:      Resp: 20 17    Temp:      TempSrc:      SpO2:      Weight:      Height:        Intake/Output Summary (Last 24 hours) at 06/04/2017 1049 Last data filed at 06/04/2017 0446 Gross per 24 hour  Intake 300 ml  Output 960 ml  Net -660 ml   Filed Weights   06/02/17 0500 06/03/17 0345 06/04/17 0446  Weight: 79.5 kg (175 lb 4.3 oz) 90.4 kg (199 lb 6.4 oz) 87.6 kg (193 lb 1.6 oz)    Examination:  General exam: NAD Respiratory system: CTA Cardiovascular system:  S 1, S 2 RRR Gastrointestinal system: BS present, soft, nt Central nervous system: non focal. Slowly answer questions.  Extremities: Symmetric power.  Skin: no rashes or ulcer.      Data Reviewed: I have personally reviewed following  labs and imaging studies  CBC: Recent Labs  Lab 05/31/17 1252 06/01/17 0226 06/02/17 0415 06/03/17 0233 06/04/17 0312  WBC 12.5* 10.0 4.0 2.9* 3.2*  NEUTROABS 11.5*  --   --   --   --   HGB 12.2 11.5* 11.1* 10.5* 11.6*  HCT 37.9 36.5 34.5* 32.2* 35.7*  MCV 97.2 98.4 96.9 97.6 98.1  PLT 176 154 158 153 629   Basic Metabolic Panel: Recent Labs  Lab 05/31/17 0635 06/01/17 0226 06/02/17 0415 06/03/17 0233 06/04/17 0312  NA 140 140 135 137 135  K 3.6 3.7 3.2* 3.5 3.6  CL 101 102 101 103 103  CO2 $Re'27 23 23 25 'Sgn$ 20*  GLUCOSE 137* 94 115* 110*  114*  BUN 16 24* 22* 17 13  CREATININE 1.22* 2.09* 1.63* 1.45* 1.32*  CALCIUM 9.3 8.1* 8.0* 8.3* 8.2*  MG  --   --  2.0  --   --   PHOS  --   --  3.7  --   --    GFR: Estimated Creatinine Clearance: 54.1 mL/min (A) (by C-G formula based on SCr of 1.32 mg/dL (H)). Liver Function Tests: Recent Labs  Lab 05/31/17 0635  AST 36  ALT 48  ALKPHOS 99  BILITOT 1.5*  PROT 7.9  ALBUMIN 4.4   No results for input(s): LIPASE, AMYLASE in the last 168 hours. No results for input(s): AMMONIA in the last 168 hours. Coagulation Profile: No results for input(s): INR, PROTIME in the last 168 hours. Cardiac Enzymes: Recent Labs  Lab 06/03/17 1627 06/03/17 2141 06/04/17 0312  TROPONINI 0.95* 1.01* 0.88*   BNP (last 3 results) No results for input(s): PROBNP in the last 8760 hours. HbA1C: No results for input(s): HGBA1C in the last 72 hours. CBG: Recent Labs  Lab 05/31/17 1155 06/01/17 0718  GLUCAP 143* 112*   Lipid Profile: No results for input(s): CHOL, HDL, LDLCALC, TRIG, CHOLHDL, LDLDIRECT in the last 72 hours. Thyroid Function Tests: No results for input(s): TSH, T4TOTAL, FREET4, T3FREE, THYROIDAB in the last 72 hours. Anemia Panel: No results for input(s): VITAMINB12, FOLATE, FERRITIN, TIBC, IRON, RETICCTPCT in the last 72 hours. Sepsis Labs: Recent Labs  Lab 06/01/17 0226 06/02/17 0415 06/03/17 0233 06/04/17 0312  PROCALCITON 5.42 3.23 1.86 0.82    Recent Results (from the past 240 hour(s))  Respiratory Panel by PCR     Status: Abnormal   Collection Time: 05/31/17 12:00 PM  Result Value Ref Range Status   Adenovirus NOT DETECTED NOT DETECTED Final   Coronavirus 229E NOT DETECTED NOT DETECTED Final   Coronavirus HKU1 NOT DETECTED NOT DETECTED Final   Coronavirus NL63 NOT DETECTED NOT DETECTED Final   Coronavirus OC43 NOT DETECTED NOT DETECTED Final   Metapneumovirus NOT DETECTED NOT DETECTED Final   Rhinovirus / Enterovirus NOT DETECTED NOT DETECTED Final    Influenza A H1 2009 DETECTED (A) NOT DETECTED Final   Influenza B NOT DETECTED NOT DETECTED Final   Parainfluenza Virus 1 NOT DETECTED NOT DETECTED Final   Parainfluenza Virus 2 NOT DETECTED NOT DETECTED Final   Parainfluenza Virus 3 NOT DETECTED NOT DETECTED Final   Parainfluenza Virus 4 NOT DETECTED NOT DETECTED Final   Respiratory Syncytial Virus NOT DETECTED NOT DETECTED Final   Bordetella pertussis NOT DETECTED NOT DETECTED Final   Chlamydophila pneumoniae NOT DETECTED NOT DETECTED Final   Mycoplasma pneumoniae NOT DETECTED NOT DETECTED Final    Comment: Performed at [facility], [place] [address] [place], [place] [address]  MRSA PCR Screening     Status: None   Collection Time: 05/31/17 12:00 PM  Result Value Ref Range Status   MRSA by PCR NEGATIVE NEGATIVE Final    Comment:        The GeneXpert MRSA Assay (FDA approved for NASAL specimens only), is one component of a comprehensive MRSA colonization surveillance program. It is not intended to diagnose MRSA infection nor to guide or monitor treatment for MRSA infections. Performed at [facility], [place] [address], [place], [place] [address]   Culture, blood (routine x 2)     Status: None (Preliminary result)   Collection Time: 05/31/17 12:55 PM  Result Value Ref Range Status   Specimen Description BLOOD RIGHT HAND  Final   Special Requests IN PEDIATRIC BOTTLE Blood Culture adequate volume  Final   Culture   Final    NO GROWTH 3 DAYS Performed at [facility], [place] [address], [place], [place] [address]    Report Status PENDING  Incomplete  Culture, blood (routine x 2)     Status: None (Preliminary result)   Collection Time: 05/31/17 12:59 PM  Result Value Ref Range Status   Specimen Description BLOOD RIGHT HAND  Final   Special Requests IN PEDIATRIC BOTTLE Blood Culture adequate volume  Final   Culture   Final    NO GROWTH 3 DAYS Performed at [facility], [place] [address] [place], [place]  [address]    Report Status PENDING  Incomplete         Radiology Studies: Dg Abd 1 View  Result Date: 06/03/2017 CLINICAL DATA:  Abdominal pain. Known midline abdominal hernia containing transverse colon. EXAM: ABDOMEN - 1 VIEW COMPARISON:  CT scan of the abdomen dated 05/31/2017 FINDINGS: Bowel gas pattern is normal. No dilated bowel. No visible free air. No acute bone abnormality. IMPRESSION: No appreciable acute abnormality of the abdomen. Electronically Signed   By: Lorriane Shire M.D.   On: 06/03/2017 16:38   Dg Chest Port 1 View  Result Date: 06/03/2017 CLINICAL DATA:  Type B aortic dissection.  Shortness of breath. EXAM: PORTABLE CHEST 1 VIEW COMPARISON:  One-view chest x-ray 06/03/2017 FINDINGS: Heart is enlarged. Mild pulmonary vascular congestion is present. Aeration of the left lung is improving. Right lung is clear. Lung volumes are low. IMPRESSION: 1. Improving aeration of the left lung with some residual lower lobe and lingular airspace disease. 2. Stable cardiomegaly and mild pulmonary vascular congestion. Electronically Signed   By: San Morelle M.D.   On: 06/03/2017 16:36   Dg Chest Port 1 View  Result Date: 06/03/2017 CLINICAL DATA:  57 year old female with cough. Stanford type B thoracic aortic dissection. EXAM: PORTABLE CHEST 1 VIEW COMPARISON:  06/01/2017, chest CTA 05/31/2017, and earlier. FINDINGS: Portable AP semi upright view at 0556 hours. Larger lung volumes. Decreased pulmonary vascular congestion. Continued confluent opacity in the left mid and lower lung. Left lung base ventilation has mildly improved since 05/31/2017 with improved visualization of the left hemidiaphragm. The right lung remains clear. Visualized tracheal air column is within normal limits. Stable cardiac size and mediastinal contours. No pneumothorax. No definite pleural effusion. IMPRESSION: 1. Continued left lung pneumonia. Mildly improved left lung base ventilation since 05/31/2017. No pleural  effusion is evident. 2. Resolved pulmonary vascular congestion since yesterday. No new cardiopulmonary abnormality. Electronically Signed   By: Genevie Ann M.D.   On: 06/03/2017 10:11        Scheduled Meds: . amLODipine  10 mg Oral Daily  . azithromycin  500 mg Oral Daily  . carvedilol  12.5 mg Oral Once  . carvedilol  25 mg Oral BID WC  . chlorthalidone  25 mg Oral Daily  . cloNIDine  0.1 mg Oral Daily  . heparin  5,000 Units Subcutaneous Q8H  . hydrALAZINE  75 mg Oral Q6H  . mouth rinse  15 mL Mouth Rinse BID  . oseltamivir  30 mg Oral BID  . pantoprazole  40 mg Oral BID  . thiamine  100 mg Oral Daily   Continuous Infusions: . sodium chloride Stopped (05/31/17 1600)  . cefTRIAXone (ROCEPHIN)  IV Stopped (06/03/17 1300)  . niCARDipine       LOS: 4 days    Time spent: 35 minutes.     Elmarie Shiley, MD [facility] Pager [phone]  If 7PM-7AM, please contact night-coverage [email or web address] Password [facility] 06/04/2017, 10:49 AM

## 2017-06-04 NOTE — Progress Notes (Addendum)
Progress Note  Patient Name: Dorothy Harris Date of Encounter: 06/04/2017  Primary Cardiologist: Fransico Him, MD   Subjective   Denies any chest pain.  Trop mildly elevated at 0.95 likely related to aortic dissection and hypertensive urgency  Inpatient Medications    Scheduled Meds: . amLODipine  10 mg Oral Daily  . azithromycin  500 mg Oral Daily  . carvedilol  12.5 mg Oral BID WC  . chlorthalidone  25 mg Oral Daily  . cloNIDine  0.1 mg Oral Daily  . heparin  5,000 Units Subcutaneous Q8H  . hydrALAZINE  50 mg Oral Q6H  . mouth rinse  15 mL Mouth Rinse BID  . oseltamivir  30 mg Oral BID  . pantoprazole  40 mg Oral BID  . thiamine  100 mg Oral Daily   Continuous Infusions: . sodium chloride Stopped (05/31/17 1600)  . cefTRIAXone (ROCEPHIN)  IV Stopped (06/03/17 1300)   PRN Meds: sodium chloride, acetaminophen, acetaminophen, albuterol, alum & mag hydroxide-simeth, hydrALAZINE, labetalol, morphine injection, ondansetron (ZOFRAN) IV   Vital Signs    Vitals:   06/04/17 0700 06/04/17 0800 06/04/17 0826 06/04/17 0917  BP: (!) 155/93 (!) 169/92 (!) 178/93 (!) 150/83  Pulse:      Resp: 20 17    Temp:      TempSrc:      SpO2:      Weight:      Height:        Intake/Output Summary (Last 24 hours) at 06/04/2017 1014 Last data filed at 06/04/2017 0446 Gross per 24 hour  Intake 300 ml  Output 960 ml  Net -660 ml   Filed Weights   06/02/17 0500 06/03/17 0345 06/04/17 0446  Weight: 175 lb 4.3 oz (79.5 kg) 199 lb 6.4 oz (90.4 kg) 193 lb 1.6 oz (87.6 kg)    Telemetry    NSR - Personally Reviewed  ECG    NSR with nonspecific ST abnormality - Personally Reviewed  Physical Exam   GEN: No acute distress.   Neck: No JVD Cardiac: RRR, no murmurs, rubs, or gallops.  Respiratory: Clear to auscultation bilaterally. GI: Soft, nontender, non-distended  MS: No edema; No deformity. Neuro:  Nonfocal  Psych: Normal affect   Labs    Chemistry Recent Labs  Lab  05/31/17 1610  06/02/17 0415 06/03/17 0233 06/04/17 0312  NA 140   < > 135 137 135  K 3.6   < > 3.2* 3.5 3.6  CL 101   < > 101 103 103  CO2 27   < > 23 25 20*  GLUCOSE 137*   < > 115* 110* 114*  BUN 16   < > 22* 17 13  CREATININE 1.22*   < > 1.63* 1.45* 1.32*  CALCIUM 9.3   < > 8.0* 8.3* 8.2*  PROT 7.9  --   --   --   --   ALBUMIN 4.4  --   --   --   --   AST 36  --   --   --   --   ALT 48  --   --   --   --   ALKPHOS 99  --   --   --   --   BILITOT 1.5*  --   --   --   --   GFRNONAA 49*   < > 34* 39* 44*  GFRAA 56*   < > 40* 46* 51*  ANIONGAP 12   < >  $'11 9 12   'O$ < > = values in this interval not displayed.     Hematology Recent Labs  Lab 06/02/17 0415 06/03/17 0233 06/04/17 0312  WBC 4.0 2.9* 3.2*  RBC 3.56* 3.30* 3.64*  HGB 11.1* 10.5* 11.6*  HCT 34.5* 32.2* 35.7*  MCV 96.9 97.6 98.1  MCH 31.2 31.8 31.9  MCHC 32.2 32.6 32.5  RDW 15.9* 16.2* 15.7*  PLT 158 153 160    Cardiac Enzymes Recent Labs  Lab 06/03/17 1627 06/03/17 2141 06/04/17 0312  TROPONINI 0.95* 1.01* 0.88*    Recent Labs  Lab 05/31/17 0635  TROPIPOC 0.02     BNP Recent Labs  Lab 05/31/17 0635 06/01/17 1112  BNP 3,012.0* 1,459.3*     DDimer No results for input(s): DDIMER in the last 168 hours.   Radiology    Dg Abd 1 View  Result Date: 06/03/2017 CLINICAL DATA:  Abdominal pain. Known midline abdominal hernia containing transverse colon. EXAM: ABDOMEN - 1 VIEW COMPARISON:  CT scan of the abdomen dated 05/31/2017 FINDINGS: Bowel gas pattern is normal. No dilated bowel. No visible free air. No acute bone abnormality. IMPRESSION: No appreciable acute abnormality of the abdomen. Electronically Signed   By: Lorriane Shire M.D.   On: 06/03/2017 16:38   Dg Chest Port 1 View  Result Date: 06/03/2017 CLINICAL DATA:  Type B aortic dissection.  Shortness of breath. EXAM: PORTABLE CHEST 1 VIEW COMPARISON:  One-view chest x-ray 06/03/2017 FINDINGS: Heart is enlarged. Mild pulmonary vascular  congestion is present. Aeration of the left lung is improving. Right lung is clear. Lung volumes are low. IMPRESSION: 1. Improving aeration of the left lung with some residual lower lobe and lingular airspace disease. 2. Stable cardiomegaly and mild pulmonary vascular congestion. Electronically Signed   By: San Morelle M.D.   On: 06/03/2017 16:36   Dg Chest Port 1 View  Result Date: 06/03/2017 CLINICAL DATA:  57 year old female with cough. Stanford type B thoracic aortic dissection. EXAM: PORTABLE CHEST 1 VIEW COMPARISON:  06/01/2017, chest CTA 05/31/2017, and earlier. FINDINGS: Portable AP semi upright view at 0556 hours. Larger lung volumes. Decreased pulmonary vascular congestion. Continued confluent opacity in the left mid and lower lung. Left lung base ventilation has mildly improved since 05/31/2017 with improved visualization of the left hemidiaphragm. The right lung remains clear. Visualized tracheal air column is within normal limits. Stable cardiac size and mediastinal contours. No pneumothorax. No definite pleural effusion. IMPRESSION: 1. Continued left lung pneumonia. Mildly improved left lung base ventilation since 05/31/2017. No pleural effusion is evident. 2. Resolved pulmonary vascular congestion since yesterday. No new cardiopulmonary abnormality. Electronically Signed   By: Genevie Ann M.D.   On: 06/03/2017 10:11    Cardiac Studies   2D echo 05/2017 Study Conclusions  - Left ventricle: The cavity size was normal. Wall thickness was increased in a pattern of severe LVH. Systolic function was normal. The estimated ejection fraction was in the range of 55% to 60%. The study is not technically sufficient to allow evaluation of LV diastolic function. - Left atrium: The atrium was severely dilated. - Right ventricle: The cavity size was mildly dilated. Wall thickness was normal. - Right atrium: The atrium was severely dilated. - Pericardium, extracardiac: A small  pericardial effusion was identified.  Patient Profile     57 y.o. female with a history ofnon-obstructive CAD per cath 2017,HTN, polysubstance abuse with ETOH/Cocaine/Marijuana and tobaccowho presented to the ED on 05/31/17 with hypertensive urgency found to have type B  aortic dissection per CT angiogram. Cards, TCTS, and vascular surgery consulted for management.  Assessment & Plan    1. Chest pain associated with acute type B aortic dissection per CT: -no chest pain since admit. -Type B aorticdissection involving the aortic arch and descending thoracic aorta is presentper chest CT - trop mildly elevated at 0.95>1.01>0.88 -this is in the setting of hypertensive urgency and influenza and is likely related to demand ischemia -0 patient denies any chest pain since admission .  2D echocardiogram showed normal LV function.   -Plan per vascular surgery   2. Hypertensive urgency: -Patient was initially on IV Cardene gtt but stopped by [facility] - blood pressure control has improved but still elevated and has been getting PRN IV labetolol and Hydralazine - goal blood pressure is less than 130/70 -will increase hydralazine to 75 mg q 6 h  -Continue amlodipine 10 mg daily -start chlorthalidone to 25 mg daily  -increase carvedilol 25mg  BID -she was started on Clonidine 0.1mg  daily by [facility] today and may need to increase to TID. -I recommend restarting Cardene gtt for now until we can get BP adequately controlled.  Will titrate to keep BP < 130/65mmHg while uptitrating PO meds.  3. Acute diastolic heart failure: -2D echo with normal LVF -BNP on admission, >3,000 and down to 1459 02/25 -she put out 960cc yesterday and is net positive 350cc. -weight are all over the place and likely inaccurate -Chest CT with patchy airspace disease -repeat cxray 02/25 with lingular airspace opacities.  No edema -she appears euvolemic on exam today.  -creatinine continues to improve and now 1.32 -starting  chlorthalidone for BP control  4. Fever with elevated WBC: -Per PCCM, currently on IV abx -Remains febrile -per IM  5. Mild obstructive CAD: -Per cath 2017  6. AMS: -Resolved, likely r/t HTN urgency  -Precedex d/c'd  7. AKI: -Cr, 1.22>>2.09>>1.63>>1.45>>1.32 -Likely r/t severe HTN and contrast dye -improved with IVF  I have spent a total of 45 minutes with patient reviewing hospital notes , telemetry, EKGs, labs and examining patient as well as establishing an assessment and plan that was discussed with the patient.  > 50% of time was spent in direct patient care.    For questions or updates, please contact [place] Please consult [email or web address] for contact info under Cardiology/STEMI.      Signed, Fransico Him, MD  06/04/2017, 10:14 AM

## 2017-06-04 NOTE — Plan of Care (Signed)
  Progressing Safety: Ability to remain free from injury will improve 06/04/2017 0313 - Progressing by Peggye Pitt, RN Skin Integrity: Risk for impaired skin integrity will decrease 06/04/2017 0313 - Progressing by Peggye Pitt, RN   Completed/Met Education: Knowledge of General Education information will improve 06/04/2017 [phone] - Completed/Met by Peggye Pitt, RN

## 2017-06-04 NOTE — Progress Notes (Signed)
While on rounds, assisted staff with BP control for Dorothy Harris.  She was alert, denies CP and SOB, c/o 5/10 headache.  BP above goal of SBP<130.  PO meds due coreg, norvasc and apresoline given.  Additional prn of labetolol and apresoline given for bp 160/92.  BP now 125/76 and patient resting. Handoff report to [employer].

## 2017-06-05 LAB — CULTURE, BLOOD (ROUTINE X 2)
CULTURE: NO GROWTH
Culture: NO GROWTH
Special Requests: ADEQUATE
Special Requests: ADEQUATE

## 2017-06-05 LAB — BASIC METABOLIC PANEL
Anion gap: 9 (ref 5–15)
BUN: 12 mg/dL (ref 6–20)
CO2: 23 mmol/L (ref 22–32)
CREATININE: 1.29 mg/dL — AB (ref 0.44–1.00)
Calcium: 8.3 mg/dL — ABNORMAL LOW (ref 8.9–10.3)
Chloride: 104 mmol/L (ref 101–111)
GFR calc Af Amer: 53 mL/min — ABNORMAL LOW (ref >60)
GFR, EST NON AFRICAN AMERICAN: 45 mL/min — AB (ref >60)
Glucose, Bld: 128 mg/dL — ABNORMAL HIGH (ref 65–99)
Potassium: 3.7 mmol/L (ref 3.5–5.1)
SODIUM: 136 mmol/L (ref 135–145)

## 2017-06-05 LAB — CBC
HCT: 36.3 % (ref 36.0–46.0)
Hemoglobin: 11.8 g/dL — ABNORMAL LOW (ref 12.0–15.0)
MCH: 31.2 pg (ref 26.0–34.0)
MCHC: 32.5 g/dL (ref 30.0–36.0)
MCV: 96 fL (ref 78.0–100.0)
PLATELETS: 201 10*3/uL (ref 150–400)
RBC: 3.78 MIL/uL — ABNORMAL LOW (ref 3.87–5.11)
RDW: 15.9 % — ABNORMAL HIGH (ref 11.5–15.5)
WBC: 4.5 10*3/uL (ref 4.0–10.5)

## 2017-06-05 MED ORDER — CHLORTHALIDONE 50 MG PO TABS
50.0000 mg | ORAL_TABLET | Freq: Every day | ORAL | Status: DC
  Administered 2017-06-05 – 2017-06-11 (×7): 50 mg via ORAL
  Filled 2017-06-05 (×8): qty 1

## 2017-06-05 MED ORDER — FUROSEMIDE 10 MG/ML IJ SOLN
40.0000 mg | Freq: Once | INTRAMUSCULAR | Status: AC
  Administered 2017-06-05: 40 mg via INTRAVENOUS
  Filled 2017-06-05: qty 4

## 2017-06-05 MED ORDER — HYDRALAZINE HCL 50 MG PO TABS
100.0000 mg | ORAL_TABLET | Freq: Four times a day (QID) | ORAL | Status: DC
  Administered 2017-06-05 – 2017-06-15 (×40): 100 mg via ORAL
  Filled 2017-06-05 (×40): qty 2

## 2017-06-05 MED ORDER — CLONIDINE HCL 0.1 MG PO TABS: 0.1000 mg | ORAL_TABLET | Freq: Two times a day (BID) | ORAL | Status: DC

## 2017-06-05 MED ORDER — CLONIDINE HCL 0.1 MG PO TABS
0.1000 mg | ORAL_TABLET | Freq: Three times a day (TID) | ORAL | Status: DC
  Administered 2017-06-05 – 2017-06-06 (×4): 0.1 mg via ORAL
  Filled 2017-06-05 (×4): qty 1

## 2017-06-05 NOTE — Progress Notes (Signed)
Progress Note  Patient Name: Dorothy Harris Date of Encounter: 06/05/2017  Primary Cardiologist: Fransico Him, MD   Subjective   Denies any chest pain.  BPs overnight improved on PRN Labetolol and Hydralazine.  Now on Cardene gtt at 8.    Inpatient Medications    Scheduled Meds: . amLODipine  10 mg Oral Daily  . azithromycin  500 mg Oral Daily  . carvedilol  25 mg Oral BID WC  . chlorthalidone  25 mg Oral Daily  . cloNIDine  0.1 mg Oral BID  . heparin  5,000 Units Subcutaneous Q8H  . hydrALAZINE  75 mg Oral Q6H  . mouth rinse  15 mL Mouth Rinse BID  . oseltamivir  30 mg Oral BID  . pantoprazole  40 mg Oral BID  . thiamine  100 mg Oral Daily   Continuous Infusions: . sodium chloride Stopped (05/31/17 1600)  . cefTRIAXone (ROCEPHIN)  IV Stopped (06/04/17 1515)  . niCARDipine 8 mg/hr (06/05/17 0815)   PRN Meds: sodium chloride, acetaminophen, acetaminophen, albuterol, alum & mag hydroxide-simeth, hydrALAZINE, labetalol, morphine injection, ondansetron (ZOFRAN) IV   Vital Signs    Vitals:   06/05/17 0745 06/05/17 0748 06/05/17 0800 06/05/17 0815  BP: 134/78  130/71 126/74  Pulse: 61  62 (!) 59  Resp: $Remo'18 20 17 'rvCUD$ (!) 21  Temp:  98.1 F (36.7 C)    TempSrc:  Oral    SpO2: 93% 98% 96% 93%  Weight:      Height:        Intake/Output Summary (Last 24 hours) at 06/05/2017 0822 Last data filed at 06/05/2017 0600 Gross per 24 hour  Intake 785.83 ml  Output 995 ml  Net -209.17 ml   Filed Weights   06/03/17 0345 06/04/17 0446 06/05/17 0424  Weight: 199 lb 6.4 oz (90.4 kg) 193 lb 1.6 oz (87.6 kg) 192 lb 10.9 oz (87.4 kg)    Telemetry    NSR - Personally Reviewed  ECG    No new EKG to review - Personally Reviewed  Physical Exam   GEN: No acute distress.   Neck: No JVD Cardiac: RRR, no murmurs, rubs, or gallops.  Respiratory: scattered expiratory wheezes bilaterally anteriorly GI: Soft, nontender, non-distended  MS: No edema; No deformity.2+ PT pulses Neuro:   Nonfocal  Psych: Normal affect   Labs    Chemistry Recent Labs  Lab 05/31/17 0635  06/02/17 0415 06/03/17 0233 06/04/17 0312  NA 140   < > 135 137 135  K 3.6   < > 3.2* 3.5 3.6  CL 101   < > 101 103 103  CO2 27   < > 23 25 20*  GLUCOSE 137*   < > 115* 110* 114*  BUN 16   < > 22* 17 13  CREATININE 1.22*   < > 1.63* 1.45* 1.32*  CALCIUM 9.3   < > 8.0* 8.3* 8.2*  PROT 7.9  --   --   --   --   ALBUMIN 4.4  --   --   --   --   AST 36  --   --   --   --   ALT 48  --   --   --   --   ALKPHOS 99  --   --   --   --   BILITOT 1.5*  --   --   --   --   GFRNONAA 49*   < > 34* 39* 44*  GFRAA  56*   < > 40* 46* 51*  ANIONGAP 12   < > $R'11 9 12   'Zu$ < > = values in this interval not displayed.     Hematology Recent Labs  Lab 06/03/17 0233 06/04/17 0312 06/05/17 0735  WBC 2.9* 3.2* 4.5  RBC 3.30* 3.64* 3.78*  HGB 10.5* 11.6* 11.8*  HCT 32.2* 35.7* 36.3  MCV 97.6 98.1 96.0  MCH 31.8 31.9 31.2  MCHC 32.6 32.5 32.5  RDW 16.2* 15.7* 15.9*  PLT 153 160 201    Cardiac Enzymes Recent Labs  Lab 06/03/17 1627 06/03/17 2141 06/04/17 0312  TROPONINI 0.95* 1.01* 0.88*    Recent Labs  Lab 05/31/17 0635  TROPIPOC 0.02     BNP Recent Labs  Lab 05/31/17 0635 06/01/17 1112  BNP 3,012.0* 1,459.3*     DDimer No results for input(s): DDIMER in the last 168 hours.   Radiology    Dg Abd 1 View  Result Date: 06/03/2017 CLINICAL DATA:  Abdominal pain. Known midline abdominal hernia containing transverse colon. EXAM: ABDOMEN - 1 VIEW COMPARISON:  CT scan of the abdomen dated 05/31/2017 FINDINGS: Bowel gas pattern is normal. No dilated bowel. No visible free air. No acute bone abnormality. IMPRESSION: No appreciable acute abnormality of the abdomen. Electronically Signed   By: Lorriane Shire M.D.   On: 06/03/2017 16:38   Dg Chest Port 1 View  Result Date: 06/03/2017 CLINICAL DATA:  Type B aortic dissection.  Shortness of breath. EXAM: PORTABLE CHEST 1 VIEW COMPARISON:  One-view chest  x-ray 06/03/2017 FINDINGS: Heart is enlarged. Mild pulmonary vascular congestion is present. Aeration of the left lung is improving. Right lung is clear. Lung volumes are low. IMPRESSION: 1. Improving aeration of the left lung with some residual lower lobe and lingular airspace disease. 2. Stable cardiomegaly and mild pulmonary vascular congestion. Electronically Signed   By: San Morelle M.D.   On: 06/03/2017 16:36    Cardiac Studies   2D echo 05/2017 Study Conclusions  - Left ventricle: The cavity size was normal. Wall thickness was increased in a pattern of severe LVH. Systolic function was normal. The estimated ejection fraction was in the range of 55% to 60%. The study is not technically sufficient to allow evaluation of LV diastolic function. - Left atrium: The atrium was severely dilated. - Right ventricle: The cavity size was mildly dilated. Wall thickness was normal. - Right atrium: The atrium was severely dilated. - Pericardium, extracardiac: A small pericardial effusion was identified.  Patient Profile     57 y.o. female with a history ofnon-obstructive CAD per cath 2017,HTN, polysubstance abuse with ETOH/Cocaine/Marijuana and tobaccowho presented to the ED on 05/31/17 with hypertensive urgency found to have type B aortic dissection per CT angiogram. Cards, TCTS, and vascular surgery consulted for management. Assessment & Plan    1. Chest pain associated with acute type B aortic dissection per CT: -no chest pain since admit. -Type B aorticdissection involving the aortic arch and descending thoracic aorta is presentper chest CT - trop mildly elevated at 0.95>1.01>0.88 -this is in the setting of hypertensive urgency and influenza and is likely related to demand ischemia -0 patient denies any chest pain since admission .  2D echocardiogram showed normal LV function.   -repeat CT of chest per Vascular surgery  2. Hypertensive urgency: -Patientwas  initiallyon IV Cardene gtt but stopped by TRH - blood pressure control has improved but still elevated and has been getting PRN IV labetolol and Hydralazine and now on  IV Cardene gtt - goal blood pressure is less than 130/70 -will increase hydralazine to 100mg  q 6 h  -Continue amlodipine 10 mg daily -increase chlorthalidone to 50 mg daily  -continue carvedilol 25mg  BID -increase Clonidine to 0.1mg   TID. -Wean Cardene gtt as tolerated to keep BP < 130/66mmHg while uptitrating PO meds.  3. Acute diastolic heart failure: -2D echo with normal LVF -BNP on admission, >3,000 and down to 145902/25 -she put out 995cc yesterday and is net positive 141cc. -weight continues to trend upward -Chest CT with patchy airspace disease -repeat cxray02/27with lingular airspace opacities and mild vascular congestion -already on chlorthalidone for BP control -will give a dose of Lasix 40mg  IV today -check BMET and Cxray in am  4. Fever with elevated WBC: -Per PCCM, currently on IV abx -Remains febrile -per IM  5. Mild obstructive CAD: -Per cath 2017  6. AMS: -Resolved, likely r/t HTN urgency  -Precedex d/c'd  7. AKI: -Cr, 1.22>>2.09>>1.63>>1.45>>1.32 -Likely r/t severe HTN and contrast dye -improved with IVF    For questions or updates, please contact [place] Please consult [email or web address] for contact info under Cardiology/STEMI.      Signed, Fransico Him, MD  06/05/2017, 8:22 AM

## 2017-06-05 NOTE — Progress Notes (Signed)
PROGRESS NOTE    Dorothy Harris  WCB:[id number] DOB: [birth year] DOA: 05/31/2017 PCP: Patient, No Pcp Per   Brief Narrative: 57yo female with hx HTN, cocaine abuse presented 2/24 with chest pain, AMS, HTN crisis. ER w/u included EKG without ischemic changes, Flu POS, CTA chest which revealed type B dissection. SHe was started on cardene gtt for aggressive BP management     Assessment & Plan:   Principal Problem:   Aortic dissection distal to left subclavian [facility]) Active Problems:   Hypertensive crisis   Descending thoracic dissection (HCC)   Chest pain   Aortic dissection, thoracic (HCC)   Coronary artery disease due to lipid rich plaque   Acute diastolic heart failure (HCC)   Influenza, pneumonia  Thoracic aortic type B Dissection; and subclavian artery dissection Needs better BP controlled.  Evaluated vascular, plan to repeat CTA when renal function recovers and prior to discharge.  Evaluated by CVTS who recommend blood  Pressure controlled.  Patient denies chest pain or dyspnea.  titration of BP medications.  Now SBP in the 120---130.   HTN Urgency;  Continue with Norvasc, coreg, hydralazine increased to 100 mg every 6 hours today.  UDS negative.  PRN hydralazine . IV labetalol./  Continue with Chlorthalidone.  Clonidine change to TID by cardio today.  Patient was transfer to ICU and was started on  Cardene Gtt.  BP goal below 130.   Abdominal pain, Dyspnea. Resolved.  KUB negative. Chest x ray no significant edema.   Troponin elevated, in setting HTN urgency. Cardiology following.   CAP, influenza A  Continue with tamiflu. Day 5.  Continue with ceftriaxone and azithromycin for PNA.  Day 6/7  Anemia; monitor hb. Hb stable at 11.   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   Acute diastolic HF exacerbation;  BNP 3,000.  Cardiology following.  Plan to received one dose of IV lasix.   AKI; improved with IV fluids. Cr peak to 2.09. It has  decreased today to 1.4 Stable.  Leukopenia; monitor. HIV negative  Resolved.    DVT prophylaxis:  Heparin  Code Status: full code.  Family Communication: care discussed with patient.  Disposition Plan: remain in the ICU   Consultants:   CVTS  Cardiology   Procedures: ECHO;   Antimicrobials: Ceftriaxone 2-24 Azithromycin 2-24  Subjective: denies chest pain or dyspnea, denies abdominal pain.   Objective: Vitals:   06/05/17 1130 06/05/17 1132 06/05/17 1138 06/05/17 1200  BP: 135/85  135/85 (!) 138/93  Pulse: (!) 56   (!) 59  Resp: 18   (!) 22  Temp:  98.6 F (37 C)    TempSrc:  Oral    SpO2: 99%   97%  Weight:      Height:        Intake/Output Summary (Last 24 hours) at 06/05/2017 1300 Last data filed at 06/05/2017 1100 Gross per 24 hour  Intake 1719.66 ml  Output 1170 ml  Net 549.66 ml   Filed Weights   06/03/17 0345 06/04/17 0446 06/05/17 0424  Weight: 90.4 kg (199 lb 6.4 oz) 87.6 kg (193 lb 1.6 oz) 87.4 kg (192 lb 10.9 oz)    Examination:  General exam: NAD, appears comfortable.  Respiratory system: CTA, normal respiratory effort.  Cardiovascular system:  S 1, S 2 RRR Gastrointestinal system: BS present, soft, nt Central nervous system: alert, non focal.  Extremities: Symmetric power.  Skin: No rash.      Data Reviewed: I have personally reviewed following  labs and imaging studies  CBC: Recent Labs  Lab 05/31/17 1252 06/01/17 0226 06/02/17 0415 06/03/17 0233 06/04/17 0312 06/05/17 0735  WBC 12.5* 10.0 4.0 2.9* 3.2* 4.5  NEUTROABS 11.5*  --   --   --   --   --   HGB 12.2 11.5* 11.1* 10.5* 11.6* 11.8*  HCT 37.9 36.5 34.5* 32.2* 35.7* 36.3  MCV 97.2 98.4 96.9 97.6 98.1 96.0  PLT 176 154 158 153 160 675   Basic Metabolic Panel: Recent Labs  Lab 06/01/17 0226 06/02/17 0415 06/03/17 0233 06/04/17 0312 06/05/17 0735  NA 140 135 137 135 136  K 3.7 3.2* 3.5 3.6 3.7  CL 102 101 103 103 104  CO2 $Re'23 23 25 'zer$ 20* 23  GLUCOSE 94 115* 110* 114*  128*  BUN 24* 22* $Remov'17 13 12  'JVWeZQ$ CREATININE 2.09* 1.63* 1.45* 1.32* 1.29*  CALCIUM 8.1* 8.0* 8.3* 8.2* 8.3*  MG  --  2.0  --   --   --   PHOS  --  3.7  --   --   --    GFR: Estimated Creatinine Clearance: 55.3 mL/min (A) (by C-G formula based on SCr of 1.29 mg/dL (H)). Liver Function Tests: Recent Labs  Lab 05/31/17 0635  AST 36  ALT 48  ALKPHOS 99  BILITOT 1.5*  PROT 7.9  ALBUMIN 4.4   No results for input(s): LIPASE, AMYLASE in the last 168 hours. No results for input(s): AMMONIA in the last 168 hours. Coagulation Profile: No results for input(s): INR, PROTIME in the last 168 hours. Cardiac Enzymes: Recent Labs  Lab 06/03/17 1627 06/03/17 2141 06/04/17 0312  TROPONINI 0.95* 1.01* 0.88*   BNP (last 3 results) No results for input(s): PROBNP in the last 8760 hours. HbA1C: No results for input(s): HGBA1C in the last 72 hours. CBG: Recent Labs  Lab 05/31/17 1155 06/01/17 0718  GLUCAP 143* 112*   Lipid Profile: No results for input(s): CHOL, HDL, LDLCALC, TRIG, CHOLHDL, LDLDIRECT in the last 72 hours. Thyroid Function Tests: No results for input(s): TSH, T4TOTAL, FREET4, T3FREE, THYROIDAB in the last 72 hours. Anemia Panel: No results for input(s): VITAMINB12, FOLATE, FERRITIN, TIBC, IRON, RETICCTPCT in the last 72 hours. Sepsis Labs: Recent Labs  Lab 06/01/17 0226 06/02/17 0415 06/03/17 0233 06/04/17 0312  PROCALCITON 5.42 3.23 1.86 0.82    Recent Results (from the past 240 hour(s))  Respiratory Panel by PCR     Status: Abnormal   Collection Time: 05/31/17 12:00 PM  Result Value Ref Range Status   Adenovirus NOT DETECTED NOT DETECTED Final   Coronavirus 229E NOT DETECTED NOT DETECTED Final   Coronavirus HKU1 NOT DETECTED NOT DETECTED Final   Coronavirus NL63 NOT DETECTED NOT DETECTED Final   Coronavirus OC43 NOT DETECTED NOT DETECTED Final   Metapneumovirus NOT DETECTED NOT DETECTED Final   Rhinovirus / Enterovirus NOT DETECTED NOT DETECTED Final    Influenza A H1 2009 DETECTED (A) NOT DETECTED Final   Influenza B NOT DETECTED NOT DETECTED Final   Parainfluenza Virus 1 NOT DETECTED NOT DETECTED Final   Parainfluenza Virus 2 NOT DETECTED NOT DETECTED Final   Parainfluenza Virus 3 NOT DETECTED NOT DETECTED Final   Parainfluenza Virus 4 NOT DETECTED NOT DETECTED Final   Respiratory Syncytial Virus NOT DETECTED NOT DETECTED Final   Bordetella pertussis NOT DETECTED NOT DETECTED Final   Chlamydophila pneumoniae NOT DETECTED NOT DETECTED Final   Mycoplasma pneumoniae NOT DETECTED NOT DETECTED Final    Comment: Performed at [facility], [place] [address] [place], [place] [address]  MRSA PCR Screening     Status: None   Collection Time: 05/31/17 12:00 PM  Result Value Ref Range Status   MRSA by PCR NEGATIVE NEGATIVE Final    Comment:        The GeneXpert MRSA Assay (FDA approved for NASAL specimens only), is one component of a comprehensive MRSA colonization surveillance program. It is not intended to diagnose MRSA infection nor to guide or monitor treatment for MRSA infections. Performed at [facility], [place] [address] [place], [place] [address]   Culture, blood (routine x 2)     Status: None   Collection Time: 05/31/17 12:55 PM  Result Value Ref Range Status   Specimen Description BLOOD RIGHT HAND  Final   Special Requests IN PEDIATRIC BOTTLE Blood Culture adequate volume  Final   Culture   Final    NO GROWTH 5 DAYS Performed at [facility], [place] [address], [place], [place] [address]    Report Status 06/05/2017 FINAL  Final  Culture, blood (routine x 2)     Status: None   Collection Time: 05/31/17 12:59 PM  Result Value Ref Range Status   Specimen Description BLOOD RIGHT HAND  Final   Special Requests IN PEDIATRIC BOTTLE Blood Culture adequate volume  Final   Culture   Final    NO GROWTH 5 DAYS Performed at [facility], [place] [address], [place], [place] [address]    Report Status 06/05/2017 FINAL   Final         Radiology Studies: Dg Abd 1 View  Result Date: 06/03/2017 CLINICAL DATA:  Abdominal pain. Known midline abdominal hernia containing transverse colon. EXAM: ABDOMEN - 1 VIEW COMPARISON:  CT scan of the abdomen dated 05/31/2017 FINDINGS: Bowel gas pattern is normal. No dilated bowel. No visible free air. No acute bone abnormality. IMPRESSION: No appreciable acute abnormality of the abdomen. Electronically Signed   By: Lorriane Shire M.D.   On: 06/03/2017 16:38   Dg Chest Port 1 View  Result Date: 06/03/2017 CLINICAL DATA:  Type B aortic dissection.  Shortness of breath. EXAM: PORTABLE CHEST 1 VIEW COMPARISON:  One-view chest x-ray 06/03/2017 FINDINGS: Heart is enlarged. Mild pulmonary vascular congestion is present. Aeration of the left lung is improving. Right lung is clear. Lung volumes are low. IMPRESSION: 1. Improving aeration of the left lung with some residual lower lobe and lingular airspace disease. 2. Stable cardiomegaly and mild pulmonary vascular congestion. Electronically Signed   By: San Morelle M.D.   On: 06/03/2017 16:36        Scheduled Meds: . amLODipine  10 mg Oral Daily  . azithromycin  500 mg Oral Daily  . carvedilol  25 mg Oral BID WC  . chlorthalidone  50 mg Oral Daily  . cloNIDine  0.1 mg Oral TID  . heparin  5,000 Units Subcutaneous Q8H  . hydrALAZINE  100 mg Oral Q6H  . mouth rinse  15 mL Mouth Rinse BID  . oseltamivir  30 mg Oral BID  . pantoprazole  40 mg Oral BID  . thiamine  100 mg Oral Daily   Continuous Infusions: . sodium chloride Stopped (05/31/17 1600)  . cefTRIAXone (ROCEPHIN)  IV Stopped (06/05/17 1234)  . niCARDipine Stopped (06/05/17 0935)     LOS: 5 days    Time spent: 35 minutes.     Elmarie Shiley, MD [facility] Pager [phone]  If 7PM-7AM,  please contact night-coverage [email or web address] Password [email or web address] 06/05/2017, 1:00 PM

## 2017-06-05 NOTE — Progress Notes (Signed)
  Progress Note    06/05/2017 8:23 AM * No surgery found *  Subjective: feeling fine this a.m., denies chest or abdominal pain  Vitals:   06/05/17 0800 06/05/17 0815  BP: 130/71 126/74  Pulse: 62 (!) 59  Resp: 17 (!) 21  Temp:    SpO2: 96% 93%    Physical Exam: aaox3 Non labored respirations Abdomen is soft with reducible hernia Palpable bilateral radial and femoral pulses  CBC    Component Value Date/Time   WBC 4.5 06/05/2017 0735   RBC 3.78 (L) 06/05/2017 0735   HGB 11.8 (L) 06/05/2017 0735   HCT 36.3 06/05/2017 0735   PLT 201 06/05/2017 0735   MCV 96.0 06/05/2017 0735   MCH 31.2 06/05/2017 0735   MCHC 32.5 06/05/2017 0735   RDW 15.9 (H) 06/05/2017 0735   LYMPHSABS 0.1 (L) 05/31/2017 1252   MONOABS 0.9 05/31/2017 1252   EOSABS 0.0 05/31/2017 1252   BASOSABS 0.0 05/31/2017 1252    BMET    Component Value Date/Time   NA 135 06/04/2017 0312   K 3.6 06/04/2017 0312   CL 103 06/04/2017 0312   CO2 20 (L) 06/04/2017 0312   GLUCOSE 114 (H) 06/04/2017 0312   BUN 13 06/04/2017 0312   CREATININE 1.32 (H) 06/04/2017 0312   CALCIUM 8.2 (L) 06/04/2017 0312   GFRNONAA 44 (L) 06/04/2017 0312   GFRAA 51 (L) 06/04/2017 0312    INR    Component Value Date/Time   INR 1.04 12/23/2015 1040     Intake/Output Summary (Last 24 hours) at 06/05/2017 0823 Last data filed at 06/05/2017 0600 Gross per 24 hour  Intake 785.83 ml  Output 995 ml  Net -209.17 ml     Assessment:  57 y.o. female is here with tbad and difficult to control htn now in icu  Plan: Continue non operative management, if she cannot be controlled would have to consider operative intervention but would be complex hybrid open/endovascular repair and she might not do well in an acute setting of type b dissection. If can be controlled with plan CTA dissection protocol next week.    Terrill Wauters C. Donzetta Matters, MD [facility] Office: [phone] Pager: [phone]  06/05/2017 8:23  AM

## 2017-06-06 ENCOUNTER — Inpatient Hospital Stay (HOSPITAL_COMMUNITY): Payer: BLUE CROSS/BLUE SHIELD

## 2017-06-06 LAB — BASIC METABOLIC PANEL
ANION GAP: 9 (ref 5–15)
BUN: 11 mg/dL (ref 6–20)
CO2: 25 mmol/L (ref 22–32)
Calcium: 8.3 mg/dL — ABNORMAL LOW (ref 8.9–10.3)
Chloride: 103 mmol/L (ref 101–111)
Creatinine, Ser: 1.35 mg/dL — ABNORMAL HIGH (ref 0.44–1.00)
GFR calc Af Amer: 50 mL/min — ABNORMAL LOW (ref >60)
GFR, EST NON AFRICAN AMERICAN: 43 mL/min — AB (ref >60)
Glucose, Bld: 117 mg/dL — ABNORMAL HIGH (ref 65–99)
POTASSIUM: 3.3 mmol/L — AB (ref 3.5–5.1)
SODIUM: 137 mmol/L (ref 135–145)

## 2017-06-06 MED ORDER — ALPRAZOLAM 0.5 MG PO TABS
0.5000 mg | ORAL_TABLET | Freq: Two times a day (BID) | ORAL | Status: DC | PRN
  Administered 2017-06-06 – 2017-06-13 (×3): 0.5 mg via ORAL
  Filled 2017-06-06 (×4): qty 1

## 2017-06-06 MED ORDER — POTASSIUM CHLORIDE 10 MEQ/100ML IV SOLN: 10.0000 meq | INTRAVENOUS | Status: DC

## 2017-06-06 MED ORDER — POTASSIUM CHLORIDE CRYS ER 20 MEQ PO TBCR
30.0000 meq | EXTENDED_RELEASE_TABLET | ORAL | Status: AC
  Administered 2017-06-06 (×3): 30 meq via ORAL
  Filled 2017-06-06 (×3): qty 1

## 2017-06-06 MED ORDER — FUROSEMIDE 10 MG/ML IJ SOLN
40.0000 mg | Freq: Once | INTRAMUSCULAR | Status: AC
  Administered 2017-06-06: 40 mg via INTRAVENOUS
  Filled 2017-06-06: qty 4

## 2017-06-06 MED ORDER — CLONIDINE HCL 0.2 MG PO TABS
0.2000 mg | ORAL_TABLET | Freq: Three times a day (TID) | ORAL | Status: DC
  Administered 2017-06-06 (×2): 0.2 mg via ORAL
  Filled 2017-06-06 (×2): qty 1

## 2017-06-06 NOTE — Progress Notes (Signed)
Progress Note  Patient Name: Dorothy Harris Date of Encounter: 06/06/2017  Primary Cardiologist: Fransico Him, MD   Subjective   Feeling well today without complaint.  Inpatient Medications    Scheduled Meds: . amLODipine  10 mg Oral Daily  . azithromycin  500 mg Oral Daily  . carvedilol  25 mg Oral BID WC  . chlorthalidone  50 mg Oral Daily  . cloNIDine  0.1 mg Oral TID  . heparin  5,000 Units Subcutaneous Q8H  . hydrALAZINE  100 mg Oral Q6H  . mouth rinse  15 mL Mouth Rinse BID  . pantoprazole  40 mg Oral BID  . thiamine  100 mg Oral Daily   Continuous Infusions: . sodium chloride Stopped (05/31/17 1600)  . cefTRIAXone (ROCEPHIN)  IV Stopped (06/05/17 1234)  . niCARDipine Stopped (06/06/17 0918)   PRN Meds: sodium chloride, acetaminophen, acetaminophen, albuterol, alum & mag hydroxide-simeth, hydrALAZINE, labetalol, morphine injection, ondansetron (ZOFRAN) IV   Vital Signs    Vitals:   06/06/17 0815 06/06/17 0830 06/06/17 0845 06/06/17 0900  BP: 121/75 127/67 122/68 124/74  Pulse: (!) 56 (!) 58 (!) 53 (!) 52  Resp: 15 (!) $Remo'21 18 19  'FeKHq$ Temp:      TempSrc:      SpO2: 96% 95% 97% 97%  Weight:      Height:        Intake/Output Summary (Last 24 hours) at 06/06/2017 0957 Last data filed at 06/06/2017 0800 Gross per 24 hour  Intake 1710.83 ml  Output 2400 ml  Net -689.17 ml   Filed Weights   06/04/17 0446 06/05/17 0424 06/06/17 0500  Weight: 193 lb 1.6 oz (87.6 kg) 192 lb 10.9 oz (87.4 kg) 187 lb 9.8 oz (85.1 kg)    Telemetry    Sinus rhythm with bradycardia into the 50s- Personally Reviewed  ECG    None new- Personally Reviewed  Physical Exam   GEN: Well nourished, well developed, in no acute distress  HEENT: normal  Neck: no JVD, carotid bruits, or masses Cardiac: RRR; no murmurs, rubs, or gallops,no edema  Respiratory:  clear to auscultation bilaterally, normal work of breathing GI: soft, nontender, nondistended, + BS MS: no deformity or atrophy    Skin: warm and dry Neuro:  Strength and sensation are intact Psych: euthymic mood, full affect   Labs    Chemistry Recent Labs  Lab 05/31/17 [phone]  06/04/17 0312 06/05/17 0735 06/06/17 0351  NA 140   < > 135 136 137  K 3.6   < > 3.6 3.7 3.3*  CL 101   < > 103 104 103  CO2 27   < > 20* 23 25  GLUCOSE 137*   < > 114* 128* 117*  BUN 16   < > $R'13 12 11  'eq$ CREATININE 1.22*   < > 1.32* 1.29* 1.35*  CALCIUM 9.3   < > 8.2* 8.3* 8.3*  PROT 7.9  --   --   --   --   ALBUMIN 4.4  --   --   --   --   AST 36  --   --   --   --   ALT 48  --   --   --   --   ALKPHOS 99  --   --   --   --   BILITOT 1.5*  --   --   --   --   GFRNONAA 49*   < > 44* 45* 43*  GFRAA 56*   < > 51* 53* 50*  ANIONGAP 12   < > 12 9 9    < > = values in this interval not displayed.     Hematology Recent Labs  Lab 06/03/17 0233 06/04/17 0312 06/05/17 0735  WBC 2.9* 3.2* 4.5  RBC 3.30* 3.64* 3.78*  HGB 10.5* 11.6* 11.8*  HCT 32.2* 35.7* 36.3  MCV 97.6 98.1 96.0  MCH 31.8 31.9 31.2  MCHC 32.6 32.5 32.5  RDW 16.2* 15.7* 15.9*  PLT 153 160 201    Cardiac Enzymes Recent Labs  Lab 06/03/17 1627 06/03/17 2141 06/04/17 0312  TROPONINI 0.95* 1.01* 0.88*    Recent Labs  Lab 05/31/17 0635  TROPIPOC 0.02     BNP Recent Labs  Lab 05/31/17 0635 06/01/17 1112  BNP 3,012.0* 1,459.3*     DDimer No results for input(s): DDIMER in the last 168 hours.   Radiology    No results found.  Cardiac Studies   2D echo 05/2017 Study Conclusions  - Left ventricle: The cavity size was normal. Wall thickness was increased in a pattern of severe LVH. Systolic function was normal. The estimated ejection fraction was in the range of 55% to 60%. The study is not technically sufficient to allow evaluation of LV diastolic function. - Left atrium: The atrium was severely dilated. - Right ventricle: The cavity size was mildly dilated. Wall thickness was normal. - Right atrium: The atrium was severely  dilated. - Pericardium, extracardiac: A small pericardial effusion was identified.  Patient Profile     57 y.o. female with a history ofnon-obstructive CAD per cath 2017,HTN, polysubstance abuse with ETOH/Cocaine/Marijuana and tobaccowho presented to the ED on 05/31/17 with hypertensive urgency found to have type B aortic dissection per CT angiogram. Cards, TCTS, and vascular surgery consulted for management. Assessment & Plan    1. Chest pain associated with acute type B aortic dissection per CT: Has had no further chest pain since admission.  Blood pressure has been better controlled with a goal of less than 585 systolic.  Plan per vascular surgery is to continue to monitor as she is not having signs of malperfusion.  We Mistina Coatney repeat chest CT when creatinine per vascular surgery.  2. Hypertensive emergency: Patient has been on and off a Cardene drip.  Is off currently but required overnight with as needed labetalol.  Has been bradycardic and thus titrating oral carvedilol is at its maximum.  Plan to increase clonidine today to see if we can get her blood pressure under control and avoid IV medications.  -hydralazine to 100mg  q 6 h  -Continue amlodipine 10 mg daily -increase chlorthalidone to 50 mg daily  -continue carvedilol 25mg  BID -Increase clonidine 0.2 mg 3 times daily -Blood pressure goal less than 130/70.    3. Acute diastolic heart failure: Echo with normal LVEF.  BNP on admission was quite elevated.  She put out 2 L yesterday.  We Mykiah Schmuck continue with diuresis.  Creatinine has remained stable.  4. Fever with elevated WBC: Currently on IV antibiotics per medicine team.  5. Mild obstructive CAD: Per cath in 2017.  6. AMS: Resolved, likely secondary to hypertensive emergency.  7. AKI: Likely due to hypertension and IV contrast.  Creatinine has been improving.      For questions or updates, please contact [place] Please consult [email or web address] for contact  info under Cardiology/STEMI.      Signed, Elan Brainerd Meredith Leeds, MD  06/06/2017, 9:57 AM

## 2017-06-06 NOTE — Progress Notes (Signed)
PROGRESS NOTE    TAMURA LASKY  KVQ:[id number] DOB: January 05, 1961 DOA: 05/31/2017 PCP: Patient, No Pcp Per   Brief Narrative: 57yo female with hx HTN, cocaine abuse presented 2/24 with chest pain, AMS, HTN crisis. ER w/u included EKG without ischemic changes, Flu POS, CTA chest which revealed type B dissection. SHe was started on cardene gtt for aggressive BP management     Assessment & Plan:   Principal Problem:   Aortic dissection distal to left subclavian [facility]) Active Problems:   Hypertensive crisis   Descending thoracic dissection (HCC)   Chest pain   Aortic dissection, thoracic (HCC)   Coronary artery disease due to lipid rich plaque   Acute diastolic heart failure (HCC)   Influenza, pneumonia  Thoracic aortic type B Dissection; and subclavian artery dissection Needs better BP controlled.  Evaluated vascular, plan to repeat CTA when renal function recovers and prior to discharge.  Evaluated by CVTS who recommend blood  Pressure controlled.  Patient denies chest pain or dyspnea.  titration of BP medications.    HTN Urgency;  Continue with Norvasc, coreg, Chlorthalidone, hydralazine, increased clonidine today by cardio.  UDS negative. TRH didn't stop Cardene.  PRN hydralazine . IV labetalol./  Patient was transfer to ICU and was started on  Cardene Gtt.  BP goal below 130.   Abdominal pain, Dyspnea. Resolved.  KUB negative. Chest x ray no significant edema.   Troponin elevated, in setting HTN urgency. Cardiology following.   CAP, influenza A  Continue with tamiflu. Day 5. Last dose today  Continue with ceftriaxone and azithromycin for PNA.  Day 7/7  Anemia; monitor hb. Hb stable at 11.   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   Acute diastolic HF exacerbation;  BNP 3,000.  Cardiology following.  Plan to received  IV lasix.  Urine out put 2 L yesterday.   AKI; improved with IV fluids. Cr peak to 2.09.  Stable. Cr at 1.3  Leukopenia;  monitor. HIV negative  Resolved.   Hypokalemia; replete k orally.   DVT prophylaxis:  Heparin  Code Status: full code.  Family Communication: care discussed with patient.  Disposition Plan: remain in the ICU   Consultants:   CVTS  Cardiology   Procedures: ECHO;   Antimicrobials: Ceftriaxone 2-24 Azithromycin 2-24  Subjective: She is feeling ok, denies chest pain or dyspnea.   Objective: Vitals:   06/06/17 1100 06/06/17 1115 06/06/17 1130 06/06/17 1221  BP: 133/77 135/81 (!) 130/104 132/79  Pulse: (!) 51 (!) 56 63   Resp: 18 16 (!) 22   Temp:   98.1 F (36.7 C)   TempSrc:   Oral   SpO2: 98% 98% 98%   Weight:      Height:        Intake/Output Summary (Last 24 hours) at 06/06/2017 1226 Last data filed at 06/06/2017 0800 Gross per 24 hour  Intake 1590 ml  Output 2000 ml  Net -410 ml   Filed Weights   06/04/17 0446 06/05/17 0424 06/06/17 0500  Weight: 87.6 kg (193 lb 1.6 oz) 87.4 kg (192 lb 10.9 oz) 85.1 kg (187 lb 9.8 oz)    Examination:  General exam: NAD Respiratory system: CTA Cardiovascular system:  S 1, S 2 RRR Gastrointestinal system: BS present, soft, nt Central nervous system: non focal.  Extremities: Symmetric power.  Skin: No rash      Data Reviewed: I have personally reviewed following labs and imaging studies  CBC: Recent Labs  Lab 05/31/17  1252 06/01/17 0226 06/02/17 0415 06/03/17 0233 06/04/17 0312 06/05/17 0735  WBC 12.5* 10.0 4.0 2.9* 3.2* 4.5  NEUTROABS 11.5*  --   --   --   --   --   HGB 12.2 11.5* 11.1* 10.5* 11.6* 11.8*  HCT 37.9 36.5 34.5* 32.2* 35.7* 36.3  MCV 97.2 98.4 96.9 97.6 98.1 96.0  PLT 176 154 158 153 160 706   Basic Metabolic Panel: Recent Labs  Lab 06/02/17 0415 06/03/17 0233 06/04/17 0312 06/05/17 0735 06/06/17 0351  NA 135 137 135 136 137  K 3.2* 3.5 3.6 3.7 3.3*  CL 101 103 103 104 103  CO2 23 25 20* 23 25  GLUCOSE 115* 110* 114* 128* 117*  BUN 22* $Remov'17 13 12 11  'NEMfkP$ CREATININE 1.63* 1.45* 1.32*  1.29* 1.35*  CALCIUM 8.0* 8.3* 8.2* 8.3* 8.3*  MG 2.0  --   --   --   --   PHOS 3.7  --   --   --   --    GFR: Estimated Creatinine Clearance: 52.2 mL/min (A) (by C-G formula based on SCr of 1.35 mg/dL (H)). Liver Function Tests: Recent Labs  Lab 05/31/17 0635  AST 36  ALT 48  ALKPHOS 99  BILITOT 1.5*  PROT 7.9  ALBUMIN 4.4   No results for input(s): LIPASE, AMYLASE in the last 168 hours. No results for input(s): AMMONIA in the last 168 hours. Coagulation Profile: No results for input(s): INR, PROTIME in the last 168 hours. Cardiac Enzymes: Recent Labs  Lab 06/03/17 1627 06/03/17 2141 06/04/17 0312  TROPONINI 0.95* 1.01* 0.88*   BNP (last 3 results) No results for input(s): PROBNP in the last 8760 hours. HbA1C: No results for input(s): HGBA1C in the last 72 hours. CBG: Recent Labs  Lab 05/31/17 1155 06/01/17 0718  GLUCAP 143* 112*   Lipid Profile: No results for input(s): CHOL, HDL, LDLCALC, TRIG, CHOLHDL, LDLDIRECT in the last 72 hours. Thyroid Function Tests: No results for input(s): TSH, T4TOTAL, FREET4, T3FREE, THYROIDAB in the last 72 hours. Anemia Panel: No results for input(s): VITAMINB12, FOLATE, FERRITIN, TIBC, IRON, RETICCTPCT in the last 72 hours. Sepsis Labs: Recent Labs  Lab 06/01/17 0226 06/02/17 0415 06/03/17 0233 06/04/17 0312  PROCALCITON 5.42 3.23 1.86 0.82    Recent Results (from the past 240 hour(s))  Respiratory Panel by PCR     Status: Abnormal   Collection Time: 05/31/17 12:00 PM  Result Value Ref Range Status   Adenovirus NOT DETECTED NOT DETECTED Final   Coronavirus 229E NOT DETECTED NOT DETECTED Final   Coronavirus HKU1 NOT DETECTED NOT DETECTED Final   Coronavirus NL63 NOT DETECTED NOT DETECTED Final   Coronavirus OC43 NOT DETECTED NOT DETECTED Final   Metapneumovirus NOT DETECTED NOT DETECTED Final   Rhinovirus / Enterovirus NOT DETECTED NOT DETECTED Final   Influenza A H1 2009 DETECTED (A) NOT DETECTED Final   Influenza  B NOT DETECTED NOT DETECTED Final   Parainfluenza Virus 1 NOT DETECTED NOT DETECTED Final   Parainfluenza Virus 2 NOT DETECTED NOT DETECTED Final   Parainfluenza Virus 3 NOT DETECTED NOT DETECTED Final   Parainfluenza Virus 4 NOT DETECTED NOT DETECTED Final   Respiratory Syncytial Virus NOT DETECTED NOT DETECTED Final   Bordetella pertussis NOT DETECTED NOT DETECTED Final   Chlamydophila pneumoniae NOT DETECTED NOT DETECTED Final   Mycoplasma pneumoniae NOT DETECTED NOT DETECTED Final    Comment: Performed at [facility], [place] [address] [place], [place] [address]  MRSA  PCR Screening     Status: None   Collection Time: 05/31/17 12:00 PM  Result Value Ref Range Status   MRSA by PCR NEGATIVE NEGATIVE Final    Comment:        The GeneXpert MRSA Assay (FDA approved for NASAL specimens only), is one component of a comprehensive MRSA colonization surveillance program. It is not intended to diagnose MRSA infection nor to guide or monitor treatment for MRSA infections. Performed at [facility], [place] [address], [place], [place] [address]   Culture, blood (routine x 2)     Status: None   Collection Time: 05/31/17 12:55 PM  Result Value Ref Range Status   Specimen Description BLOOD RIGHT HAND  Final   Special Requests IN PEDIATRIC BOTTLE Blood Culture adequate volume  Final   Culture   Final    NO GROWTH 5 DAYS Performed at [facility], [place] [address] [place], [place] [address]    Report Status 06/05/2017 FINAL  Final  Culture, blood (routine x 2)     Status: None   Collection Time: 05/31/17 12:59 PM  Result Value Ref Range Status   Specimen Description BLOOD RIGHT HAND  Final   Special Requests IN PEDIATRIC BOTTLE Blood Culture adequate volume  Final   Culture   Final    NO GROWTH 5 DAYS Performed at [facility], [place] [address], [place], [place] [address]    Report Status 06/05/2017 FINAL  Final         Radiology Studies: No results  found.      Scheduled Meds: . amLODipine  10 mg Oral Daily  . azithromycin  500 mg Oral Daily  . carvedilol  25 mg Oral BID WC  . chlorthalidone  50 mg Oral Daily  . cloNIDine  0.2 mg Oral TID  . heparin  5,000 Units Subcutaneous Q8H  . hydrALAZINE  100 mg Oral Q6H  . mouth rinse  15 mL Mouth Rinse BID  . pantoprazole  40 mg Oral BID  . potassium chloride  30 mEq Oral Q2H  . thiamine  100 mg Oral Daily   Continuous Infusions: . sodium chloride Stopped (05/31/17 1600)  . cefTRIAXone (ROCEPHIN)  IV Stopped (06/05/17 1234)  . niCARDipine Stopped (06/06/17 0918)     LOS: 6 days    Time spent: 35 minutes.     Elmarie Shiley, MD [facility] Pager [phone]  If 7PM-7AM, please contact night-coverage [email or web address] Password [facility] 06/06/2017, 12:26 PM

## 2017-06-06 NOTE — Progress Notes (Signed)
   Daily Progress Note   Assessment/Planning:   Type B Ao Dx with complications   BP controlled with Cardene drip  Pt current asx without any evidence of malperfusion  Emphasized to the patient the potentially fatal consequences of poor BP control   Subjective     No chest or back pain, urinating as normal, no sx in legs or abdomen   Objective   Vitals:   06/06/17 0700 06/06/17 0730 06/06/17 0742 06/06/17 0745  BP: 134/84 135/77 135/77 129/78  Pulse: (!) 49 60 60 (!) 57  Resp: $Remo'19 20  18  'PxHMT$ Temp:    98.2 F (36.8 C)  TempSrc:    Oral  SpO2: 96% 96%  96%  Weight:      Height:         Intake/Output Summary (Last 24 hours) at 06/06/2017 0823 Last data filed at 06/06/2017 0600 Gross per 24 hour  Intake 2186.66 ml  Output 2075 ml  Net 111.66 ml    PULM  CTAB  CV  RRR  GI  soft, NTND  VASC B feet appear well perfused with intact sensory and motor    Laboratory   CBC CBC Latest Ref Rng & Units 06/05/2017 06/04/2017 06/03/2017  WBC 4.0 - 10.5 K/uL 4.5 3.2(L) 2.9(L)  Hemoglobin 12.0 - 15.0 g/dL 11.8(L) 11.6(L) 10.5(L)  Hematocrit 36.0 - 46.0 % 36.3 35.7(L) 32.2(L)  Platelets 150 - 400 K/uL 201 160 153    BMET    Component Value Date/Time   NA 137 06/06/2017 0351   K 3.3 (L) 06/06/2017 0351   CL 103 06/06/2017 0351   CO2 25 06/06/2017 0351   GLUCOSE 117 (H) 06/06/2017 0351   BUN 11 06/06/2017 0351   CREATININE 1.35 (H) 06/06/2017 0351   CALCIUM 8.3 (L) 06/06/2017 0351   GFRNONAA 43 (L) 06/06/2017 0351   GFRAA 50 (L) 06/06/2017 0351     Adele Barthel, MD, FACS [facility] Office: [phone] Pager: [phone]  06/06/2017, 8:23 AM

## 2017-06-07 LAB — BASIC METABOLIC PANEL
Anion gap: 12 (ref 5–15)
BUN: 14 mg/dL (ref 6–20)
CALCIUM: 8.8 mg/dL — AB (ref 8.9–10.3)
CHLORIDE: 102 mmol/L (ref 101–111)
CO2: 23 mmol/L (ref 22–32)
CREATININE: 1.43 mg/dL — AB (ref 0.44–1.00)
GFR calc Af Amer: 46 mL/min — ABNORMAL LOW (ref >60)
GFR calc non Af Amer: 40 mL/min — ABNORMAL LOW (ref >60)
Glucose, Bld: 119 mg/dL — ABNORMAL HIGH (ref 65–99)
Potassium: 4.2 mmol/L (ref 3.5–5.1)
Sodium: 137 mmol/L (ref 135–145)

## 2017-06-07 LAB — CBC
HCT: 38.8 % (ref 36.0–46.0)
Hemoglobin: 12.9 g/dL (ref 12.0–15.0)
MCH: 31.6 pg (ref 26.0–34.0)
MCHC: 33.2 g/dL (ref 30.0–36.0)
MCV: 95.1 fL (ref 78.0–100.0)
PLATELETS: 315 10*3/uL (ref 150–400)
RBC: 4.08 MIL/uL (ref 3.87–5.11)
RDW: 15.5 % (ref 11.5–15.5)
WBC: 5.5 10*3/uL (ref 4.0–10.5)

## 2017-06-07 MED ORDER — CLONIDINE HCL 0.2 MG PO TABS
0.3000 mg | ORAL_TABLET | Freq: Three times a day (TID) | ORAL | Status: DC
  Administered 2017-06-07 – 2017-06-15 (×25): 0.3 mg via ORAL
  Filled 2017-06-07 (×25): qty 1

## 2017-06-07 NOTE — Progress Notes (Signed)
RT note-Incentive spirometer given with effort and cough and clear, no wheezing at this time. Patient states that she does smoke. Encouraged cough and deep breathing and IS usage.

## 2017-06-07 NOTE — Progress Notes (Signed)
Pt refusing bath at this time.

## 2017-06-07 NOTE — Progress Notes (Signed)
Progress Note  Patient Name: Dorothy Harris Date of Encounter: 06/07/2017  Primary Cardiologist: Fransico Him, MD   Subjective   Feeling well without complaint.  Inpatient Medications    Scheduled Meds: . amLODipine  10 mg Oral Daily  . azithromycin  500 mg Oral Daily  . carvedilol  25 mg Oral BID WC  . chlorthalidone  50 mg Oral Daily  . cloNIDine  0.2 mg Oral TID  . heparin  5,000 Units Subcutaneous Q8H  . hydrALAZINE  100 mg Oral Q6H  . mouth rinse  15 mL Mouth Rinse BID  . pantoprazole  40 mg Oral BID  . thiamine  100 mg Oral Daily   Continuous Infusions: . sodium chloride Stopped (05/31/17 1600)  . cefTRIAXone (ROCEPHIN)  IV Stopped (06/06/17 1327)  . niCARDipine 5 mg/hr (06/07/17 0700)   PRN Meds: sodium chloride, acetaminophen, acetaminophen, albuterol, ALPRAZolam, alum & mag hydroxide-simeth, hydrALAZINE, labetalol, morphine injection, ondansetron (ZOFRAN) IV   Vital Signs    Vitals:   06/07/17 0800 06/07/17 0815 06/07/17 0830 06/07/17 0845  BP: 134/80 127/82 131/78 129/83  Pulse: (!) 56 (!) 57 (!) 57 64  Resp: (!) 21 (!) 23 (!) 22 (!) 21  Temp:      TempSrc:      SpO2: 96% 96% 97% 96%  Weight:      Height:        Intake/Output Summary (Last 24 hours) at 06/07/2017 0906 Last data filed at 06/07/2017 6789 Gross per 24 hour  Intake 1098.58 ml  Output 3350 ml  Net -2251.42 ml   Filed Weights   06/05/17 0424 06/06/17 0500 06/07/17 0600  Weight: 192 lb 10.9 oz (87.4 kg) 187 lb 9.8 oz (85.1 kg) 183 lb 10.3 oz (83.3 kg)    Telemetry    Sinus rhythm- Personally Reviewed  ECG    None new- Personally Reviewed  Physical Exam   GEN: Well nourished, well developed, in no acute distress  HEENT: normal  Neck: no JVD, carotid bruits, or masses Cardiac: RRR; no murmurs, rubs, or gallops,no edema  Respiratory:  clear to auscultation bilaterally, normal work of breathing GI: soft, nontender, nondistended, + BS MS: no deformity or atrophy  Skin: warm and  dry Neuro:  Strength and sensation are intact Psych: euthymic mood, full affect    Labs    Chemistry Recent Labs  Lab 06/05/17 0735 06/06/17 0351 06/07/17 0406  NA 136 137 137  K 3.7 3.3* 4.2  CL 104 103 102  CO2 $Re'23 25 23  'Xxh$ GLUCOSE 128* 117* 119*  BUN $Re'12 11 14  'cpM$ CREATININE 1.29* 1.35* 1.43*  CALCIUM 8.3* 8.3* 8.8*  GFRNONAA 45* 43* 40*  GFRAA 53* 50* 46*  ANIONGAP $RemoveB'9 9 12     'XyRUmAIw$ Hematology Recent Labs  Lab 06/04/17 0312 06/05/17 0735 06/07/17 0406  WBC 3.2* 4.5 5.5  RBC 3.64* 3.78* 4.08  HGB 11.6* 11.8* 12.9  HCT 35.7* 36.3 38.8  MCV 98.1 96.0 95.1  MCH 31.9 31.2 31.6  MCHC 32.5 32.5 33.2  RDW 15.7* 15.9* 15.5  PLT 160 201 315    Cardiac Enzymes Recent Labs  Lab 06/03/17 1627 06/03/17 2141 06/04/17 0312  TROPONINI 0.95* 1.01* 0.88*    No results for input(s): TROPIPOC in the last 168 hours.   BNP Recent Labs  Lab 06/01/17 1112  BNP 1,459.3*     DDimer No results for input(s): DDIMER in the last 168 hours.   Radiology    Dg Chest 2 View  Result Date: 06/06/2017 CLINICAL DATA:  Initial evaluation for abnormal chest radiograph. EXAM: CHEST  2 VIEW COMPARISON:  Prior radiograph from 06/03/2017. FINDINGS: Stable cardiomegaly.  Mediastinal silhouette is unchanged. Lungs normally inflated. Small layering bilateral pleural effusions. Mild perihilar vascular congestion without pulmonary edema. Patchy and linear opacities at the left lung base, progressed from previous, which may reflect atelectasis and/or consolidation. No other focal airspace disease. No pneumothorax. Osseous structures unchanged. IMPRESSION: 1. Patchy left lower lobe opacity, slightly worsened from previous, which may reflect progressive atelectasis and/or pneumonia. 2. Small layering bilateral pleural effusions. 3. Stable cardiomegaly without pulmonary edema. Electronically Signed   By: Jeannine Boga M.D.   On: 06/06/2017 13:43    Cardiac Studies   2D echo 05/2017 Study  Conclusions  - Left ventricle: The cavity size was normal. Wall thickness was increased in a pattern of severe LVH. Systolic function was normal. The estimated ejection fraction was in the range of 55% to 60%. The study is not technically sufficient to allow evaluation of LV diastolic function. - Left atrium: The atrium was severely dilated. - Right ventricle: The cavity size was mildly dilated. Wall thickness was normal. - Right atrium: The atrium was severely dilated. - Pericardium, extracardiac: A small pericardial effusion was identified.  Patient Profile     57 y.o. female with a history ofnon-obstructive CAD per cath 2017,HTN, polysubstance abuse with ETOH/Cocaine/Marijuana and tobaccowho presented to the ED on 05/31/17 with hypertensive urgency found to have type B aortic dissection per CT angiogram. Cards, TCTS, and vascular surgery consulted for management. Assessment & Plan    1. Chest pain associated with acute type B aortic dissection per CT: Goal systolic BP <347 mmHg. Has been on and off cardene drip overnight. Plan per vascular surgery. Winston Sobczyk need repeat CT scan prior to discharge for further evaluation.  2. Hypertensive emergency: Continuing to require nicardipine drip overnight. Milanna Kozlov increase clonidine to 0.3 mg TID.  -hydralazine to 100mg  q 6 h  -Continue amlodipine 10 mg daily -continue chlorthalidone to 50 mg daily  -continue carvedilol 25mg  BID -Increase clonidine 0.3 mg 3 times daily -Blood pressure goal less than 130/70.    3. Acute diastolic heart failure: TTE with normal LVEF. No changes. Volume without issue.  4. Fever with elevated WBC: Per primary team  5. Mild obstructive CAD: Per cath in 2017.  6. AMS: Resolved, likely secondary to hypertensive emergency.  7. AKI: Creatinine has stabilized. Likely due to contrast and hypertension    For questions or updates, please contact [place] Please consult [email or web address]  for contact info under Cardiology/STEMI.      Signed, Kaliann Coryell Meredith Leeds, MD  06/07/2017, 9:06 AM

## 2017-06-07 NOTE — Progress Notes (Signed)
PROGRESS NOTE    Dorothy Harris  ZOX:[id number] DOB: Jan 31, 1961 DOA: 05/31/2017 PCP: Patient, No Pcp Per   Brief Narrative: 57yo female with hx HTN, cocaine abuse presented 2/24 with chest pain, AMS, HTN crisis. ER w/u included EKG without ischemic changes, Flu POS, CTA chest which revealed type B dissection. SHe was started on cardene gtt for aggressive BP management     Assessment & Plan:   Principal Problem:   Aortic dissection distal to left subclavian [facility]) Active Problems:   Hypertensive crisis   Descending thoracic dissection (HCC)   Chest pain   Aortic dissection, thoracic (HCC)   Coronary artery disease due to lipid rich plaque   Acute diastolic heart failure (HCC)   Influenza, pneumonia  Thoracic aortic type B Dissection; and subclavian artery dissection Evaluated vascular, plan to repeat CTA when renal function recovers and prior to discharge.  Evaluated by CVTS who recommend blood  Pressure controlled.  Patient denies chest pain or dyspnea.  Continue with titration of BP medications. Cardene gtt stop this am.    HTN Urgency;  Continue with Norvasc, coreg, Chlorthalidone, hydralazine, plan to increase clonidine to 0.3 TID>  UDS negative. TRH didn't stop Cardene.  PRN hydralazine . IV labetalol./  Patient was transfer again to ICU and was started on  Cardene Gtt.  BP goal below 130.   Abdominal pain, Dyspnea. Resolved.  KUB negative. Chest x ray no significant edema.   Troponin elevated, in setting HTN urgency. Cardiology following.   CAP, influenza A  Finished treatment of influenza. Received 5 days of tamiflu.  Continue with ceftriaxone  for PNA.  Received 7 days of azithromycin. Continue with ceftriaxone due to worsening PNA on chest x ray. Will also add incentive spirometry   Anemia; monitor hb. Hb stable at 11.   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   Acute diastolic HF exacerbation;  BNP 3,000.  Cardiology following.  Has  received IV lasix, PRN.  Negative 1.3 L.    AKI; improved with IV fluids. Cr peak to 2.09.  Cr stable.   Leukopenia; monitor. HIV negative  Resolved.  Anxiety; xanax PRN.   Hypokalemia; resolved.   DVT prophylaxis:  Heparin  Code Status: full code.  Family Communication: care discussed with patient.  Disposition Plan: remain in the ICU   Consultants:   CVTS  Cardiology   Procedures: ECHO;   Antimicrobials: Ceftriaxone 2-24 Azithromycin 2-24  Subjective: She is feeling ok, denies chest pain or dyspnea.   Objective: Vitals:   06/07/17 0930 06/07/17 0945 06/07/17 1000 06/07/17 1021  BP: 118/77 126/77 127/75   Pulse: (!) 55 (!) 56 (!) 56   Resp: (!) 24 (!) 22 20   Temp:      TempSrc:      SpO2: 97% 98% 95% 99%  Weight:      Height:        Intake/Output Summary (Last 24 hours) at 06/07/2017 1041 Last data filed at 06/07/2017 0839 Gross per 24 hour  Intake 1089.58 ml  Output 3350 ml  Net -2260.42 ml   Filed Weights   06/05/17 0424 06/06/17 0500 06/07/17 0600  Weight: 87.4 kg (192 lb 10.9 oz) 85.1 kg (187 lb 9.8 oz) 83.3 kg (183 lb 10.3 oz)    Examination:  General exam: NAD, sitting recliner.  Respiratory system: Normal respiratory effort, CTA Cardiovascular system: S 1, S 2 RRR Gastrointestinal system: BS present, soft, nt Central nervous system: Non focal.  Extremities: Symmetric power.  Skin: No rash      Data Reviewed: I have personally reviewed following labs and imaging studies  CBC: Recent Labs  Lab 05/31/17 1252  06/02/17 0415 06/03/17 0233 06/04/17 0312 06/05/17 0735 06/07/17 0406  WBC 12.5*   < > 4.0 2.9* 3.2* 4.5 5.5  NEUTROABS 11.5*  --   --   --   --   --   --   HGB 12.2   < > 11.1* 10.5* 11.6* 11.8* 12.9  HCT 37.9   < > 34.5* 32.2* 35.7* 36.3 38.8  MCV 97.2   < > 96.9 97.6 98.1 96.0 95.1  PLT 176   < > 158 153 160 201 315   < > = values in this interval not displayed.   Basic Metabolic Panel: Recent Labs  Lab  06/02/17 0415 06/03/17 0233 06/04/17 0312 06/05/17 0735 06/06/17 0351 06/07/17 0406  NA 135 137 135 136 137 137  K 3.2* 3.5 3.6 3.7 3.3* 4.2  CL 101 103 103 104 103 102  CO2 23 25 20* $Remo'23 25 23  'yjFlh$ GLUCOSE 115* 110* 114* 128* 117* 119*  BUN 22* $Remov'17 13 12 11 14  'fjSukV$ CREATININE 1.63* 1.45* 1.32* 1.29* 1.35* 1.43*  CALCIUM 8.0* 8.3* 8.2* 8.3* 8.3* 8.8*  MG 2.0  --   --   --   --   --   PHOS 3.7  --   --   --   --   --    GFR: Estimated Creatinine Clearance: 48.8 mL/min (A) (by C-G formula based on SCr of 1.43 mg/dL (H)). Liver Function Tests: No results for input(s): AST, ALT, ALKPHOS, BILITOT, PROT, ALBUMIN in the last 168 hours. No results for input(s): LIPASE, AMYLASE in the last 168 hours. No results for input(s): AMMONIA in the last 168 hours. Coagulation Profile: No results for input(s): INR, PROTIME in the last 168 hours. Cardiac Enzymes: Recent Labs  Lab 06/03/17 1627 06/03/17 2141 06/04/17 0312  TROPONINI 0.95* 1.01* 0.88*   BNP (last 3 results) No results for input(s): PROBNP in the last 8760 hours. HbA1C: No results for input(s): HGBA1C in the last 72 hours. CBG: Recent Labs  Lab 05/31/17 1155 06/01/17 0718  GLUCAP 143* 112*   Lipid Profile: No results for input(s): CHOL, HDL, LDLCALC, TRIG, CHOLHDL, LDLDIRECT in the last 72 hours. Thyroid Function Tests: No results for input(s): TSH, T4TOTAL, FREET4, T3FREE, THYROIDAB in the last 72 hours. Anemia Panel: No results for input(s): VITAMINB12, FOLATE, FERRITIN, TIBC, IRON, RETICCTPCT in the last 72 hours. Sepsis Labs: Recent Labs  Lab 06/01/17 0226 06/02/17 0415 06/03/17 0233 06/04/17 0312  PROCALCITON 5.42 3.23 1.86 0.82    Recent Results (from the past 240 hour(s))  Respiratory Panel by PCR     Status: Abnormal   Collection Time: 05/31/17 12:00 PM  Result Value Ref Range Status   Adenovirus NOT DETECTED NOT DETECTED Final   Coronavirus 229E NOT DETECTED NOT DETECTED Final   Coronavirus HKU1 NOT  DETECTED NOT DETECTED Final   Coronavirus NL63 NOT DETECTED NOT DETECTED Final   Coronavirus OC43 NOT DETECTED NOT DETECTED Final   Metapneumovirus NOT DETECTED NOT DETECTED Final   Rhinovirus / Enterovirus NOT DETECTED NOT DETECTED Final   Influenza A H1 2009 DETECTED (A) NOT DETECTED Final   Influenza B NOT DETECTED NOT DETECTED Final   Parainfluenza Virus 1 NOT DETECTED NOT DETECTED Final   Parainfluenza Virus 2 NOT DETECTED NOT DETECTED Final   Parainfluenza Virus 3 NOT DETECTED NOT DETECTED Final  Parainfluenza Virus 4 NOT DETECTED NOT DETECTED Final   Respiratory Syncytial Virus NOT DETECTED NOT DETECTED Final   Bordetella pertussis NOT DETECTED NOT DETECTED Final   Chlamydophila pneumoniae NOT DETECTED NOT DETECTED Final   Mycoplasma pneumoniae NOT DETECTED NOT DETECTED Final    Comment: Performed at [facility], [place] [address], [place], [place] [address]  MRSA PCR Screening     Status: None   Collection Time: 05/31/17 12:00 PM  Result Value Ref Range Status   MRSA by PCR NEGATIVE NEGATIVE Final    Comment:        The GeneXpert MRSA Assay (FDA approved for NASAL specimens only), is one component of a comprehensive MRSA colonization surveillance program. It is not intended to diagnose MRSA infection nor to guide or monitor treatment for MRSA infections. Performed at [facility], [place] [address] [place], [place] [address]   Culture, blood (routine x 2)     Status: None   Collection Time: 05/31/17 12:55 PM  Result Value Ref Range Status   Specimen Description BLOOD RIGHT HAND  Final   Special Requests IN PEDIATRIC BOTTLE Blood Culture adequate volume  Final   Culture   Final    NO GROWTH 5 DAYS Performed at [facility], [place] [address], [place], [place] [address]    Report Status 06/05/2017 FINAL  Final  Culture, blood (routine x 2)     Status: None   Collection Time: 05/31/17 12:59 PM  Result Value Ref Range Status   Specimen Description BLOOD  RIGHT HAND  Final   Special Requests IN PEDIATRIC BOTTLE Blood Culture adequate volume  Final   Culture   Final    NO GROWTH 5 DAYS Performed at [facility], [place] [address], [place], [place] [address]    Report Status 06/05/2017 FINAL  Final         Radiology Studies: Dg Chest 2 View  Result Date: 06/06/2017 CLINICAL DATA:  Initial evaluation for abnormal chest radiograph. EXAM: CHEST  2 VIEW COMPARISON:  Prior radiograph from 06/03/2017. FINDINGS: Stable cardiomegaly.  Mediastinal silhouette is unchanged. Lungs normally inflated. Small layering bilateral pleural effusions. Mild perihilar vascular congestion without pulmonary edema. Patchy and linear opacities at the left lung base, progressed from previous, which may reflect atelectasis and/or consolidation. No other focal airspace disease. No pneumothorax. Osseous structures unchanged. IMPRESSION: 1. Patchy left lower lobe opacity, slightly worsened from previous, which may reflect progressive atelectasis and/or pneumonia. 2. Small layering bilateral pleural effusions. 3. Stable cardiomegaly without pulmonary edema. Electronically Signed   By: Jeannine Boga M.D.   On: 06/06/2017 13:43        Scheduled Meds: . amLODipine  10 mg Oral Daily  . azithromycin  500 mg Oral Daily  . carvedilol  25 mg Oral BID WC  . chlorthalidone  50 mg Oral Daily  . cloNIDine  0.3 mg Oral TID  . heparin  5,000 Units Subcutaneous Q8H  . hydrALAZINE  100 mg Oral Q6H  . mouth rinse  15 mL Mouth Rinse BID  . pantoprazole  40 mg Oral BID  . thiamine  100 mg Oral Daily   Continuous Infusions: . sodium chloride Stopped (05/31/17 1600)  . cefTRIAXone (ROCEPHIN)  IV Stopped (06/06/17 1327)  . niCARDipine Stopped (06/07/17 0940)     LOS: 7 days    Time spent: 35 minutes.     Elmarie Shiley, MD [facility] Pager [phone]  If 7PM-7AM, please contact  night-coverage [email or web address] Password [email or web address] 06/07/2017, 10:41 AM

## 2017-06-08 DIAGNOSIS — I248 Other forms of acute ischemic heart disease: Secondary | ICD-10-CM

## 2017-06-08 LAB — BASIC METABOLIC PANEL
Anion gap: 9 (ref 5–15)
BUN: 15 mg/dL (ref 6–20)
CALCIUM: 8.6 mg/dL — AB (ref 8.9–10.3)
CO2: 23 mmol/L (ref 22–32)
CREATININE: 1.4 mg/dL — AB (ref 0.44–1.00)
Chloride: 104 mmol/L (ref 101–111)
GFR, EST AFRICAN AMERICAN: 48 mL/min — AB (ref >60)
GFR, EST NON AFRICAN AMERICAN: 41 mL/min — AB (ref >60)
Glucose, Bld: 124 mg/dL — ABNORMAL HIGH (ref 65–99)
Potassium: 4.5 mmol/L (ref 3.5–5.1)
SODIUM: 136 mmol/L (ref 135–145)

## 2017-06-08 MED ORDER — NICARDIPINE HCL IN NACL 40-0.83 MG/200ML-% IV SOLN
3.0000 mg/h | INTRAVENOUS | Status: DC
  Filled 2017-06-08: qty 200

## 2017-06-08 MED ORDER — LISINOPRIL 10 MG PO TABS
20.0000 mg | ORAL_TABLET | Freq: Every day | ORAL | Status: DC
  Administered 2017-06-08 – 2017-06-10 (×3): 20 mg via ORAL
  Filled 2017-06-08 (×3): qty 1

## 2017-06-08 MED ORDER — NICARDIPINE HCL IN NACL 20-0.86 MG/200ML-% IV SOLN
3.0000 mg/h | INTRAVENOUS | Status: DC
  Filled 2017-06-08: qty 200

## 2017-06-08 NOTE — Progress Notes (Signed)
Progress Note  Patient Name: Dorothy Harris Date of Encounter: 06/08/2017  Primary Cardiologist: Fransico Him, MD   Subjective   No cp, no sob, no n/v/f/bleeding. No abd pain  Inpatient Medications    Scheduled Meds: . amLODipine  10 mg Oral Daily  . carvedilol  25 mg Oral BID WC  . chlorthalidone  50 mg Oral Daily  . cloNIDine  0.3 mg Oral TID  . heparin  5,000 Units Subcutaneous Q8H  . hydrALAZINE  100 mg Oral Q6H  . mouth rinse  15 mL Mouth Rinse BID  . pantoprazole  40 mg Oral BID  . thiamine  100 mg Oral Daily   Continuous Infusions: . sodium chloride Stopped (05/31/17 1600)  . cefTRIAXone (ROCEPHIN)  IV Stopped (06/07/17 1247)  . niCARDipine 5 mg/hr (06/08/17 0749)   PRN Meds: sodium chloride, acetaminophen, acetaminophen, albuterol, ALPRAZolam, alum & mag hydroxide-simeth, hydrALAZINE, labetalol, morphine injection, ondansetron (ZOFRAN) IV   Vital Signs    Vitals:   06/08/17 0715 06/08/17 0730 06/08/17 0745 06/08/17 0759  BP: 129/85 128/79 133/83   Pulse: (!) 51 (!) 51 (!) 55   Resp: $Remo'17 17 17   'xHZUk$ Temp:    99 F (37.2 C)  TempSrc:    Oral  SpO2: 98% 98% 97%   Weight:      Height:        Intake/Output Summary (Last 24 hours) at 06/08/2017 0812 Last data filed at 06/08/2017 0600 Gross per 24 hour  Intake 1303.33 ml  Output 925 ml  Net 378.33 ml   Filed Weights   06/06/17 0500 06/07/17 0600 06/08/17 0600  Weight: 187 lb 9.8 oz (85.1 kg) 183 lb 10.3 oz (83.3 kg) 179 lb 10.8 oz (81.5 kg)    Telemetry    Sinus brady - Personally Reviewed  ECG    No new - Personally Reviewed  Physical Exam   GEN: No acute distress.   Neck: No JVD Cardiac: brady reg, no murmurs, rubs, or gallops.  Respiratory: Clear to auscultation bilaterally. GI: Soft, nontender, non-distended  MS: No edema; No deformity. Neuro:  Nonfocal  Psych: Normal affect   Labs    Chemistry Recent Labs  Lab 06/06/17 0351 06/07/17 0406 06/08/17 0321  NA 137 137 136  K 3.3* 4.2 4.5   CL 103 102 104  CO2 $Re'25 23 23  'vAE$ GLUCOSE 117* 119* 124*  BUN $Re'11 14 15  'Fcf$ CREATININE 1.35* 1.43* 1.40*  CALCIUM 8.3* 8.8* 8.6*  GFRNONAA 43* 40* 41*  GFRAA 50* 46* 48*  ANIONGAP $RemoveB'9 12 9     'vFkSdJZj$ Hematology Recent Labs  Lab 06/04/17 0312 06/05/17 0735 06/07/17 0406  WBC 3.2* 4.5 5.5  RBC 3.64* 3.78* 4.08  HGB 11.6* 11.8* 12.9  HCT 35.7* 36.3 38.8  MCV 98.1 96.0 95.1  MCH 31.9 31.2 31.6  MCHC 32.5 32.5 33.2  RDW 15.7* 15.9* 15.5  PLT 160 201 315    Cardiac Enzymes Recent Labs  Lab 06/03/17 1627 06/03/17 2141 06/04/17 0312  TROPONINI 0.95* 1.01* 0.88*   No results for input(s): TROPIPOC in the last 168 hours.   BNP Recent Labs  Lab 06/01/17 1112  BNP 1,459.3*     DDimer No results for input(s): DDIMER in the last 168 hours.   Radiology    Dg Chest 2 View  Result Date: 06/06/2017 CLINICAL DATA:  Initial evaluation for abnormal chest radiograph. EXAM: CHEST  2 VIEW COMPARISON:  Prior radiograph from 06/03/2017. FINDINGS: Stable cardiomegaly.  Mediastinal silhouette is unchanged.  Lungs normally inflated. Small layering bilateral pleural effusions. Mild perihilar vascular congestion without pulmonary edema. Patchy and linear opacities at the left lung base, progressed from previous, which may reflect atelectasis and/or consolidation. No other focal airspace disease. No pneumothorax. Osseous structures unchanged. IMPRESSION: 1. Patchy left lower lobe opacity, slightly worsened from previous, which may reflect progressive atelectasis and/or pneumonia. 2. Small layering bilateral pleural effusions. 3. Stable cardiomegaly without pulmonary edema. Electronically Signed   By: Jeannine Boga M.D.   On: 06/06/2017 13:43    Cardiac Studies   2D echo 05/2017 Study Conclusions  - Left ventricle: The cavity size was normal. Wall thickness was increased in a pattern of severe LVH. Systolic function was normal. The estimated ejection fraction was in the range of 55% to 60%.  The study is not technically sufficient to allow evaluation of LV diastolic function. - Left atrium: The atrium was severely dilated. - Right ventricle: The cavity size was mildly dilated. Wall thickness was normal. - Right atrium: The atrium was severely dilated. - Pericardium, extracardiac: A small pericardial effusion was identified.    Patient Profile     57 y.o. female with cocaine abuse who presented on 2/24 with chest discomfort, acute mental status changes, hypertensive crisis, flu with CT angiogram of chest revealing type B aortic dissection.  Assessment & Plan    Type B aortic dissection -Strict blood pressure control, vascular consultation reviewed, multiple medications per medication list as above.  It be nice to try to keep her blood pressure less than 423 systolic. -Creatinine on arrival was 2.0, currently 1.4.  We will go ahead and restart the lisinopril 20 mg once a day as she was taking at home.  As long as she does not rise above 2.0 I think this is reasonable. Will check CT abd prior to DC. Stop cardene.   Troponin/demand ischemia -Low level, flat troponin 0.8 in the setting of hypertensive urgency, supply demand mismatch.  On 12/24/2015 she underwent cardiac catheterization that showed minimal coronary artery plaque.  Her urine drug screen was negative for cocaine.  AKI  - improved, restarting lisinopril  Flu A  - completed ceftriaxone and azith.   Consider transfer.  For questions or updates, please contact [place] Please consult [email or web address] for contact info under Cardiology/STEMI.      Signed, Candee Furbish, MD  06/08/2017, 8:12 AM

## 2017-06-08 NOTE — Progress Notes (Signed)
PROGRESS NOTE    Dorothy Harris  CBJ:[id number] DOB: 1960/07/20 DOA: 05/31/2017 PCP: Patient, No Pcp Per   Brief Narrative: 57yo female with hx HTN, cocaine abuse presented 2/24 with chest pain, AMS, HTN crisis. ER w/u included EKG without ischemic changes, Flu POS, CTA chest which revealed type B dissection. She was started on cardene gtt for aggressive BP management     Assessment & Plan:   Principal Problem:   Aortic dissection distal to left subclavian [facility]) Active Problems:   Hypertensive crisis   Descending thoracic dissection (HCC)   Chest pain   Aortic dissection, thoracic (HCC)   Coronary artery disease due to lipid rich plaque   Acute diastolic heart failure (HCC)   Influenza, pneumonia  Thoracic aortic type B Dissection; and subclavian artery dissection Evaluated vascular, plan to repeat CTA when renal function recovers and prior to discharge.  Evaluated by CVTS who recommend blood  Pressure controlled.  Patient denies chest pain or dyspnea.  Continue with titration of BP medications.  On and off on cardene. Plan to see how she does off cardene this am.    HTN Urgency;  Continue with Norvasc, coreg, Chlorthalidone, hydralazine,  clonidine to 0.3 TID>  UDS negative.  PRN hydralazine . IV labetalol./  Patient was transfer again to ICU and was started on  Cardene Gtt.  BP goal below 130.  Cardiology started lisinopril. Monitor renal function on lisinopril.   Abdominal pain, Dyspnea. Resolved.  KUB negative. Chest x ray no significant edema.   Troponin elevated, in setting HTN urgency. Cardiology following.   CAP, influenza A  Finished treatment of influenza. Received 5 days of tamiflu.  Continue with ceftriaxone  for PNA.  Received 7 days of azithromycin. Day 8 of ceftriaxone.  Will stop antibiotics,. WBC normal.   Anemia; monitor hb. Hb stable at 11.   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   Acute diastolic HF exacerbation;  BNP  3,000.  Cardiology following.  Has received IV lasix, PRN.    AKI; improved with IV fluids. Cr peak to 2.09.  Cr stable.   Leukopenia; monitor. HIV negative  Resolved.  Anxiety; xanax PRN.   Hypokalemia; resolved.   DVT prophylaxis:  Heparin  Code Status: full code.  Family Communication: care discussed with patient.  Disposition Plan: remain in the ICU   Consultants:   CVTS  Cardiology   Procedures: ECHO;   Antimicrobials: Ceftriaxone 2-24 Azithromycin 2-24  Subjective: Denies chest pain or epigastric pain   Objective: Vitals:   06/08/17 1130 06/08/17 1139 06/08/17 1200 06/08/17 1230  BP: 138/85  133/79 134/79  Pulse: (!) 52  (!) 51 (!) 50  Resp: (!) $RemoveB'21  17 17  'YggQAGqj$ Temp:  97.8 F (36.6 C)    TempSrc:  Oral    SpO2: 97%  98% 98%  Weight:      Height:        Intake/Output Summary (Last 24 hours) at 06/08/2017 1310 Last data filed at 06/08/2017 1130 Gross per 24 hour  Intake 1122.67 ml  Output 525 ml  Net 597.67 ml   Filed Weights   06/06/17 0500 06/07/17 0600 06/08/17 0600  Weight: 85.1 kg (187 lb 9.8 oz) 83.3 kg (183 lb 10.3 oz) 81.5 kg (179 lb 10.8 oz)    Examination:  General exam: NAD Respiratory system: CTA Cardiovascular system: S 1, S 2 RRR Gastrointestinal system: BS present, soft, nt Central nervous system: Non focal.  Extremities: Symmetric power.  Skin: No rash  Data Reviewed: I have personally reviewed following labs and imaging studies  CBC: Recent Labs  Lab 06/02/17 0415 06/03/17 0233 06/04/17 0312 06/05/17 0735 06/07/17 0406  WBC 4.0 2.9* 3.2* 4.5 5.5  HGB 11.1* 10.5* 11.6* 11.8* 12.9  HCT 34.5* 32.2* 35.7* 36.3 38.8  MCV 96.9 97.6 98.1 96.0 95.1  PLT 158 153 160 201 779   Basic Metabolic Panel: Recent Labs  Lab 06/02/17 0415  06/04/17 0312 06/05/17 0735 06/06/17 0351 06/07/17 0406 06/08/17 0321  NA 135   < > 135 136 137 137 136  K 3.2*   < > 3.6 3.7 3.3* 4.2 4.5  CL 101   < > 103 104 103 102 104  CO2 23    < > 20* $Rem'23 25 23 23  'GGro$ GLUCOSE 115*   < > 114* 128* 117* 119* 124*  BUN 22*   < > $R'13 12 11 14 15  'sp$ CREATININE 1.63*   < > 1.32* 1.29* 1.35* 1.43* 1.40*  CALCIUM 8.0*   < > 8.2* 8.3* 8.3* 8.8* 8.6*  MG 2.0  --   --   --   --   --   --   PHOS 3.7  --   --   --   --   --   --    < > = values in this interval not displayed.   GFR: Estimated Creatinine Clearance: 49.3 mL/min (A) (by C-G formula based on SCr of 1.4 mg/dL (H)). Liver Function Tests: No results for input(s): AST, ALT, ALKPHOS, BILITOT, PROT, ALBUMIN in the last 168 hours. No results for input(s): LIPASE, AMYLASE in the last 168 hours. No results for input(s): AMMONIA in the last 168 hours. Coagulation Profile: No results for input(s): INR, PROTIME in the last 168 hours. Cardiac Enzymes: Recent Labs  Lab 06/03/17 1627 06/03/17 2141 06/04/17 0312  TROPONINI 0.95* 1.01* 0.88*   BNP (last 3 results) No results for input(s): PROBNP in the last 8760 hours. HbA1C: No results for input(s): HGBA1C in the last 72 hours. CBG: No results for input(s): GLUCAP in the last 168 hours. Lipid Profile: No results for input(s): CHOL, HDL, LDLCALC, TRIG, CHOLHDL, LDLDIRECT in the last 72 hours. Thyroid Function Tests: No results for input(s): TSH, T4TOTAL, FREET4, T3FREE, THYROIDAB in the last 72 hours. Anemia Panel: No results for input(s): VITAMINB12, FOLATE, FERRITIN, TIBC, IRON, RETICCTPCT in the last 72 hours. Sepsis Labs: Recent Labs  Lab 06/02/17 0415 06/03/17 0233 06/04/17 0312  PROCALCITON 3.23 1.86 0.82    Recent Results (from the past 240 hour(s))  Respiratory Panel by PCR     Status: Abnormal   Collection Time: 05/31/17 12:00 PM  Result Value Ref Range Status   Adenovirus NOT DETECTED NOT DETECTED Final   Coronavirus 229E NOT DETECTED NOT DETECTED Final   Coronavirus HKU1 NOT DETECTED NOT DETECTED Final   Coronavirus NL63 NOT DETECTED NOT DETECTED Final   Coronavirus OC43 NOT DETECTED NOT DETECTED Final    Metapneumovirus NOT DETECTED NOT DETECTED Final   Rhinovirus / Enterovirus NOT DETECTED NOT DETECTED Final   Influenza A H1 2009 DETECTED (A) NOT DETECTED Final   Influenza B NOT DETECTED NOT DETECTED Final   Parainfluenza Virus 1 NOT DETECTED NOT DETECTED Final   Parainfluenza Virus 2 NOT DETECTED NOT DETECTED Final   Parainfluenza Virus 3 NOT DETECTED NOT DETECTED Final   Parainfluenza Virus 4 NOT DETECTED NOT DETECTED Final   Respiratory Syncytial Virus NOT DETECTED NOT DETECTED Final   Bordetella pertussis NOT  DETECTED NOT DETECTED Final   Chlamydophila pneumoniae NOT DETECTED NOT DETECTED Final   Mycoplasma pneumoniae NOT DETECTED NOT DETECTED Final    Comment: Performed at [facility], [place] [address] [place], [place] [address]  MRSA PCR Screening     Status: None   Collection Time: 05/31/17 12:00 PM  Result Value Ref Range Status   MRSA by PCR NEGATIVE NEGATIVE Final    Comment:        The GeneXpert MRSA Assay (FDA approved for NASAL specimens only), is one component of a comprehensive MRSA colonization surveillance program. It is not intended to diagnose MRSA infection nor to guide or monitor treatment for MRSA infections. Performed at [facility], [place] [address] [place], [place] [address]   Culture, blood (routine x 2)     Status: None   Collection Time: 05/31/17 12:55 PM  Result Value Ref Range Status   Specimen Description BLOOD RIGHT HAND  Final   Special Requests IN PEDIATRIC BOTTLE Blood Culture adequate volume  Final   Culture   Final    NO GROWTH 5 DAYS Performed at [facility], [place] [address], [place], [place] [address]    Report Status 06/05/2017 FINAL  Final  Culture, blood (routine x 2)     Status: None   Collection Time: 05/31/17 12:59 PM  Result Value Ref Range Status   Specimen Description BLOOD RIGHT HAND  Final   Special Requests IN PEDIATRIC BOTTLE Blood Culture adequate volume  Final   Culture   Final    NO GROWTH 5  DAYS Performed at [facility], [place] [address], [place], [place] [address]    Report Status 06/05/2017 FINAL  Final         Radiology Studies: No results found.      Scheduled Meds: . amLODipine  10 mg Oral Daily  . carvedilol  25 mg Oral BID WC  . chlorthalidone  50 mg Oral Daily  . cloNIDine  0.3 mg Oral TID  . heparin  5,000 Units Subcutaneous Q8H  . hydrALAZINE  100 mg Oral Q6H  . lisinopril  20 mg Oral Daily  . mouth rinse  15 mL Mouth Rinse BID  . pantoprazole  40 mg Oral BID  . thiamine  100 mg Oral Daily   Continuous Infusions: . sodium chloride Stopped (05/31/17 1600)  . cefTRIAXone (ROCEPHIN)  IV Stopped (06/08/17 1248)  . niCARDipine Stopped (06/08/17 0926)     LOS: 8 days    Time spent: 35 minutes.     Elmarie Shiley, MD [facility] Pager [phone]  If 7PM-7AM, please contact night-coverage [email or web address] Password [email or web address] 06/08/2017, 1:10 PM

## 2017-06-08 NOTE — Progress Notes (Signed)
Patient is 7 days from positive Flu test, has received treatment and is fever free.  Droplet isolation removed with support of IP.  Richardean Canal RN, BSN, CCRN

## 2017-06-08 NOTE — Progress Notes (Signed)
  Progress Note    06/08/2017 9:02 AM * No surgery found *  Subjective:  Feeling better  Vitals:   06/08/17 0830 06/08/17 0845  BP: 132/80 (!) 150/87  Pulse: (!) 55 (!) 55  Resp: (!) 23 14  Temp:    SpO2: 96% 97%    Physical Exam: aaox3 Non labored respirations Abdomen is soft Palpable radial and femoral pulses  CBC    Component Value Date/Time   WBC 5.5 06/07/2017 0406   RBC 4.08 06/07/2017 0406   HGB 12.9 06/07/2017 0406   HCT 38.8 06/07/2017 0406   PLT 315 06/07/2017 0406   MCV 95.1 06/07/2017 0406   MCH 31.6 06/07/2017 0406   MCHC 33.2 06/07/2017 0406   RDW 15.5 06/07/2017 0406   LYMPHSABS 0.1 (L) 05/31/2017 1252   MONOABS 0.9 05/31/2017 1252   EOSABS 0.0 05/31/2017 1252   BASOSABS 0.0 05/31/2017 1252    BMET    Component Value Date/Time   NA 136 06/08/2017 0321   K 4.5 06/08/2017 0321   CL 104 06/08/2017 0321   CO2 23 06/08/2017 0321   GLUCOSE 124 (H) 06/08/2017 0321   BUN 15 06/08/2017 0321   CREATININE 1.40 (H) 06/08/2017 0321   CALCIUM 8.6 (L) 06/08/2017 0321   GFRNONAA 41 (L) 06/08/2017 0321   GFRAA 48 (L) 06/08/2017 0321    INR    Component Value Date/Time   INR 1.04 12/23/2015 1040     Intake/Output Summary (Last 24 hours) at 06/08/2017 0902 Last data filed at 06/08/2017 0800 Gross per 24 hour  Intake 1104.33 ml  Output 525 ml  Net 579.33 ml     Assessment:  57 y.o. female is here tbad, bp is better controlled  Plan: bp is better controlled Will plan CTA dissection protocol to re-evaluate prior to discharge   Brandon C. Donzetta Matters, MD [facility] [place] Office: [phone] Pager: [phone]  06/08/2017 9:02 AM

## 2017-06-08 NOTE — Plan of Care (Signed)
  Clinical Measurements: Ability to maintain clinical measurements within normal limits will improve 06/08/2017 1018 - Progressing by Larena Glassman E, RN Note Off cardene drip this AM, hoping to remain on PO meds only.    Activity: Risk for activity intolerance will decrease 06/08/2017 1018 - Progressing by Boris Lown, RN Note Ambulated to the bathroom with standby assist this AM.    Nutrition: Adequate nutrition will be maintained 06/08/2017 1018 - Progressing by Boris Lown, RN Note Poor appetite.

## 2017-06-09 LAB — BASIC METABOLIC PANEL
ANION GAP: 10 (ref 5–15)
BUN: 18 mg/dL (ref 6–20)
CHLORIDE: 102 mmol/L (ref 101–111)
CO2: 22 mmol/L (ref 22–32)
Calcium: 8.9 mg/dL (ref 8.9–10.3)
Creatinine, Ser: 1.59 mg/dL — ABNORMAL HIGH (ref 0.44–1.00)
GFR calc non Af Amer: 35 mL/min — ABNORMAL LOW (ref >60)
GFR, EST AFRICAN AMERICAN: 41 mL/min — AB (ref >60)
Glucose, Bld: 129 mg/dL — ABNORMAL HIGH (ref 65–99)
Potassium: 4.4 mmol/L (ref 3.5–5.1)
SODIUM: 134 mmol/L — AB (ref 135–145)

## 2017-06-09 NOTE — Progress Notes (Signed)
   Patient bp now well controlled. Unfortunately given AKI she is high risk for another contrast load at this time and will defer until outpatient in 2-4 weeks.   Kensley Valladares C. Donzetta Matters, MD [facility] Office: [phone] Pager: [phone]

## 2017-06-09 NOTE — Progress Notes (Signed)
PROGRESS NOTE    Dorothy Harris  ZOX:[id number] DOB: 14-Dec-1960 DOA: 05/31/2017 PCP: Patient, No Pcp Per   Brief Narrative: 57yo female with hx HTN, cocaine abuse presented 2/24 with chest pain, AMS, HTN crisis. ER w/u included EKG without ischemic changes, Flu POS, CTA chest which revealed type B dissection. She was started on cardene gtt for aggressive BP management . She is now off Cardene gtt. She will be transfer to step down bed. Follow vascular recommendation.     Assessment & Plan:   Principal Problem:   Aortic dissection distal to left subclavian [facility]) Active Problems:   Hypertensive crisis   Descending thoracic dissection (HCC)   Chest pain   Aortic dissection, thoracic (HCC)   Coronary artery disease due to lipid rich plaque   Acute diastolic heart failure (HCC)   Influenza, pneumonia  Thoracic aortic type B Dissection; and subclavian artery dissection Evaluated vascular, plan to repeat CTA when renal function recovers and prior to discharge.  Evaluated by CVTS who recommend blood  Pressure controlled.  Patient denies chest pain or dyspnea.  Continue with titration of BP medications.  On and off on cardene. Transfer to step down.  Follow vascular recommendation regarding repeating CT chest   HTN Urgency;  Continue with Norvasc, coreg, Chlorthalidone, hydralazine,  clonidine to 0.3 TID>  UDS negative.  PRN hydralazine . IV labetalol./  Patient was transfer again to ICU and was started on  Cardene Gtt.  BP goal below 130.  Cardiology started lisinopril. Mildly increase cr to 1.5. Repeat labs in am.   Abdominal pain, Dyspnea. Resolved.  KUB negative. Chest x ray no significant edema.   Troponin elevated, in setting HTN urgency. Cardiology following.   CAP, influenza A  Finished treatment of influenza. Received 5 days of tamiflu.  Continue with ceftriaxone  for PNA.  Received 7 days of azithromycin. Day 8 of ceftriaxone.  Will stop antibiotics,. WBC normal.    Anemia; monitor hb. Hb stable at 11.   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   Acute diastolic HF exacerbation;  BNP 3,000.  Cardiology following.  Has received IV lasix, PRN.    AKI; improved with IV fluids. Cr peak to 2.09.  Cr stable.   Leukopenia; monitor. HIV negative  Resolved.  Anxiety; xanax PRN.   Hypokalemia; resolved.   DVT prophylaxis:  Heparin  Code Status: full code.  Family Communication: care discussed with patient.  Disposition Plan: Transfer to step down , PT evaluation.   Consultants:   CVTS  Cardiology   Procedures: ECHO;   Antimicrobials: Ceftriaxone 2-24 Azithromycin 2-24  Subjective: She is feeling well, denies chest pain   Objective: Vitals:   06/09/17 0500 06/09/17 0600 06/09/17 0700 06/09/17 0800  BP: 127/75 128/79 134/81 129/80  Pulse: (!) 59 (!) 57 (!) 55 (!) 57  Resp: (!) 24 (!) 24 (!) 22 20  Temp:   98.6 F (37 C)   TempSrc:   Oral   SpO2: 97% 97% 97% 98%  Weight: 83.2 kg (183 lb 6.8 oz)     Height:        Intake/Output Summary (Last 24 hours) at 06/09/2017 0909 Last data filed at 06/09/2017 0700 Gross per 24 hour  Intake 1551.67 ml  Output 2175 ml  Net -623.33 ml   Filed Weights   06/07/17 0600 06/08/17 0600 06/09/17 0500  Weight: 83.3 kg (183 lb 10.3 oz) 81.5 kg (179 lb 10.8 oz) 83.2 kg (183 lb 6.8 oz)  Examination:  General exam: NAD Respiratory system: CTA Cardiovascular system: S 1, S 2 RRR Gastrointestinal system: BS present, soft, nt Central nervous system: non focal.  Extremities: symmetric power.  Skin: No rash      Data Reviewed: I have personally reviewed following labs and imaging studies  CBC: Recent Labs  Lab 06/03/17 0233 06/04/17 0312 06/05/17 0735 06/07/17 0406  WBC 2.9* 3.2* 4.5 5.5  HGB 10.5* 11.6* 11.8* 12.9  HCT 32.2* 35.7* 36.3 38.8  MCV 97.6 98.1 96.0 95.1  PLT 153 160 201 032   Basic Metabolic Panel: Recent Labs  Lab 06/05/17 0735  06/06/17 0351 06/07/17 0406 06/08/17 0321 06/09/17 0254  NA 136 137 137 136 134*  K 3.7 3.3* 4.2 4.5 4.4  CL 104 103 102 104 102  CO2 $Re'23 25 23 23 22  'zcf$ GLUCOSE 128* 117* 119* 124* 129*  BUN $Re'12 11 14 15 18  'dyu$ CREATININE 1.29* 1.35* 1.43* 1.40* 1.59*  CALCIUM 8.3* 8.3* 8.8* 8.6* 8.9   GFR: Estimated Creatinine Clearance: 43.8 mL/min (A) (by C-G formula based on SCr of 1.59 mg/dL (H)). Liver Function Tests: No results for input(s): AST, ALT, ALKPHOS, BILITOT, PROT, ALBUMIN in the last 168 hours. No results for input(s): LIPASE, AMYLASE in the last 168 hours. No results for input(s): AMMONIA in the last 168 hours. Coagulation Profile: No results for input(s): INR, PROTIME in the last 168 hours. Cardiac Enzymes: Recent Labs  Lab 06/03/17 1627 06/03/17 2141 06/04/17 0312  TROPONINI 0.95* 1.01* 0.88*   BNP (last 3 results) No results for input(s): PROBNP in the last 8760 hours. HbA1C: No results for input(s): HGBA1C in the last 72 hours. CBG: No results for input(s): GLUCAP in the last 168 hours. Lipid Profile: No results for input(s): CHOL, HDL, LDLCALC, TRIG, CHOLHDL, LDLDIRECT in the last 72 hours. Thyroid Function Tests: No results for input(s): TSH, T4TOTAL, FREET4, T3FREE, THYROIDAB in the last 72 hours. Anemia Panel: No results for input(s): VITAMINB12, FOLATE, FERRITIN, TIBC, IRON, RETICCTPCT in the last 72 hours. Sepsis Labs: Recent Labs  Lab 06/03/17 0233 06/04/17 0312  PROCALCITON 1.86 0.82    Recent Results (from the past 240 hour(s))  Respiratory Panel by PCR     Status: Abnormal   Collection Time: 05/31/17 12:00 PM  Result Value Ref Range Status   Adenovirus NOT DETECTED NOT DETECTED Final   Coronavirus 229E NOT DETECTED NOT DETECTED Final   Coronavirus HKU1 NOT DETECTED NOT DETECTED Final   Coronavirus NL63 NOT DETECTED NOT DETECTED Final   Coronavirus OC43 NOT DETECTED NOT DETECTED Final   Metapneumovirus NOT DETECTED NOT DETECTED Final   Rhinovirus /  Enterovirus NOT DETECTED NOT DETECTED Final   Influenza A H1 2009 DETECTED (A) NOT DETECTED Final   Influenza B NOT DETECTED NOT DETECTED Final   Parainfluenza Virus 1 NOT DETECTED NOT DETECTED Final   Parainfluenza Virus 2 NOT DETECTED NOT DETECTED Final   Parainfluenza Virus 3 NOT DETECTED NOT DETECTED Final   Parainfluenza Virus 4 NOT DETECTED NOT DETECTED Final   Respiratory Syncytial Virus NOT DETECTED NOT DETECTED Final   Bordetella pertussis NOT DETECTED NOT DETECTED Final   Chlamydophila pneumoniae NOT DETECTED NOT DETECTED Final   Mycoplasma pneumoniae NOT DETECTED NOT DETECTED Final    Comment: Performed at [facility], [place] [address], [place], [place] [address]  MRSA PCR Screening     Status: None   Collection Time: 05/31/17 12:00 PM  Result Value Ref Range Status   MRSA by PCR  NEGATIVE NEGATIVE Final    Comment:        The GeneXpert MRSA Assay (FDA approved for NASAL specimens only), is one component of a comprehensive MRSA colonization surveillance program. It is not intended to diagnose MRSA infection nor to guide or monitor treatment for MRSA infections. Performed at [facility], [place] [address], [place], [place] [address]   Culture, blood (routine x 2)     Status: None   Collection Time: 05/31/17 12:55 PM  Result Value Ref Range Status   Specimen Description BLOOD RIGHT HAND  Final   Special Requests IN PEDIATRIC BOTTLE Blood Culture adequate volume  Final   Culture   Final    NO GROWTH 5 DAYS Performed at [facility], [place] [address], [place], [place] [address]    Report Status 06/05/2017 FINAL  Final  Culture, blood (routine x 2)     Status: None   Collection Time: 05/31/17 12:59 PM  Result Value Ref Range Status   Specimen Description BLOOD RIGHT HAND  Final   Special Requests IN PEDIATRIC BOTTLE Blood Culture adequate volume  Final   Culture   Final    NO GROWTH 5 DAYS Performed at [facility], [place] [address],  [place], [place] [address]    Report Status 06/05/2017 FINAL  Final         Radiology Studies: No results found.      Scheduled Meds: . amLODipine  10 mg Oral Daily  . carvedilol  25 mg Oral BID WC  . chlorthalidone  50 mg Oral Daily  . cloNIDine  0.3 mg Oral TID  . heparin  5,000 Units Subcutaneous Q8H  . hydrALAZINE  100 mg Oral Q6H  . lisinopril  20 mg Oral Daily  . mouth rinse  15 mL Mouth Rinse BID  . pantoprazole  40 mg Oral BID  . thiamine  100 mg Oral Daily   Continuous Infusions: . sodium chloride Stopped (05/31/17 1600)  . niCARDipine Stopped (06/08/17 0926)     LOS: 9 days    Time spent: 35 minutes.     Elmarie Shiley, MD [facility] Pager [phone]  If 7PM-7AM, please contact night-coverage [email or web address] Password [email or web address] 06/09/2017, 9:09 AM

## 2017-06-09 NOTE — Progress Notes (Signed)
Progress Note  Patient Name: Dorothy Harris Date of Encounter: 06/09/2017  Primary Cardiologist: Fransico Him, MD   Subjective   Doing well, laying in bed. No CP, no SOB, no abd pain  Inpatient Medications    Scheduled Meds: . amLODipine  10 mg Oral Daily  . carvedilol  25 mg Oral BID WC  . chlorthalidone  50 mg Oral Daily  . cloNIDine  0.3 mg Oral TID  . heparin  5,000 Units Subcutaneous Q8H  . hydrALAZINE  100 mg Oral Q6H  . lisinopril  20 mg Oral Daily  . mouth rinse  15 mL Mouth Rinse BID  . pantoprazole  40 mg Oral BID  . thiamine  100 mg Oral Daily   Continuous Infusions: . sodium chloride Stopped (05/31/17 1600)  . niCARDipine Stopped (06/08/17 0926)   PRN Meds: sodium chloride, acetaminophen, acetaminophen, albuterol, ALPRAZolam, alum & mag hydroxide-simeth, hydrALAZINE, labetalol, morphine injection, ondansetron (ZOFRAN) IV   Vital Signs    Vitals:   06/09/17 0600 06/09/17 0700 06/09/17 0800 06/09/17 0900  BP: 128/79 134/81 129/80 127/81  Pulse: (!) 57 (!) 55 (!) 57 (!) 56  Resp: (!) 24 (!) 22 20 (!) 23  Temp:  98.6 F (37 C)    TempSrc:  Oral    SpO2: 97% 97% 98% 98%  Weight:      Height:        Intake/Output Summary (Last 24 hours) at 06/09/2017 0941 Last data filed at 06/09/2017 0700 Gross per 24 hour  Intake 1360 ml  Output 2175 ml  Net -815 ml   Filed Weights   06/07/17 0600 06/08/17 0600 06/09/17 0500  Weight: 183 lb 10.3 oz (83.3 kg) 179 lb 10.8 oz (81.5 kg) 183 lb 6.8 oz (83.2 kg)    Telemetry    Sinus brady,  No changes - Personally Reviewed  ECG    No new - Personally Reviewed  Physical Exam   GEN: Well nourished, well developed, in no acute distress  HEENT: normal  Neck: no JVD, carotid bruits, or masses Cardiac: RRR; no murmurs, rubs, or gallops,no edema  Respiratory:  clear to auscultation bilaterally, normal work of breathing GI: soft, nontender, nondistended, + BS MS: no deformity or atrophy  Skin: warm and dry, no  rash Neuro:  Alert and Oriented x 3, Strength and sensation are intact Psych: euthymic mood, full affect   Labs    Chemistry Recent Labs  Lab 06/07/17 0406 06/08/17 0321 06/09/17 0254  NA 137 136 134*  K 4.2 4.5 4.4  CL 102 104 102  CO2 $Re'23 23 22  'vxN$ GLUCOSE 119* 124* 129*  BUN $Re'14 15 18  'itJ$ CREATININE 1.43* 1.40* 1.59*  CALCIUM 8.8* 8.6* 8.9  GFRNONAA 40* 41* 35*  GFRAA 46* 48* 41*  ANIONGAP $RemoveB'12 9 10     'xdwTTpdS$ Hematology Recent Labs  Lab 06/04/17 0312 06/05/17 0735 06/07/17 0406  WBC 3.2* 4.5 5.5  RBC 3.64* 3.78* 4.08  HGB 11.6* 11.8* 12.9  HCT 35.7* 36.3 38.8  MCV 98.1 96.0 95.1  MCH 31.9 31.2 31.6  MCHC 32.5 32.5 33.2  RDW 15.7* 15.9* 15.5  PLT 160 201 315    Cardiac Enzymes Recent Labs  Lab 06/03/17 1627 06/03/17 2141 06/04/17 0312  TROPONINI 0.95* 1.01* 0.88*   No results for input(s): TROPIPOC in the last 168 hours.   BNP No results for input(s): BNP, PROBNP in the last 168 hours.   DDimer No results for input(s): DDIMER in the last 168 hours.  Radiology    No results found.  Cardiac Studies   2D echo 05/2017 Study Conclusions  - Left ventricle: The cavity size was normal. Wall thickness was increased in a pattern of severe LVH. Systolic function was normal. The estimated ejection fraction was in the range of 55% to 60%. The study is not technically sufficient to allow evaluation of LV diastolic function. - Left atrium: The atrium was severely dilated. - Right ventricle: The cavity size was mildly dilated. Wall thickness was normal. - Right atrium: The atrium was severely dilated. - Pericardium, extracardiac: A small pericardial effusion was identified.    Patient Profile     57 y.o. female with history of cocaine abuse who presented on 2/24 with chest discomfort, acute mental status changes, hypertensive crisis, flu with CT angiogram of chest revealing type B aortic dissection (distal to left subclavian).  Assessment & Plan     Type B aortic dissection -Strict blood pressure control, vascular consultation reviewed, multiple medications per medication list as above.  It would be nice to try to keep her blood pressure less than 147 systolic. -Creatinine on arrival was 2.0, currently 1.59 from 1.4 after restarting home lisinopril 20 mg once a day as she was taking at home.  As long as she does not rise above 2.0 I think this is reasonable. Will check CT abd prior to DC per vascular. Stopped cardene.   Troponin/demand ischemia -Low level, flat troponin 0.8 in the setting of hypertensive urgency, supply demand mismatch.  On 12/24/2015 she underwent cardiac catheterization that showed minimal coronary artery plaque. No further CP. DOing well.   Her urine drug screen was negative for cocaine.  AKI  - improved, restarting lisinopril, now 1.59. Monitor.   Flu A  - completed ceftriaxone and azith. Off Droplets  Consider transfer once again.  Will sign off. Please call if any other ?  For questions or updates, please contact [place] Please consult [email or web address] for contact info under Cardiology/STEMI.      Signed, Candee Furbish, MD  06/09/2017, 9:41 AM

## 2017-06-09 NOTE — Plan of Care (Signed)
  Progressing Clinical Measurements: Ability to maintain clinical measurements within normal limits will improve 06/09/2017 0928 - Progressing by Boris Lown, RN Note Cardene drip discontinued yesterday. Maintaining stable BP on oral meds.  Activity: Risk for activity intolerance will decrease 06/09/2017 0928 - Progressing by Boris Lown, RN Nutrition: Adequate nutrition will be maintained 06/09/2017 0928 - Progressing by Boris Lown, RN Elimination: Will not experience complications related to urinary retention 06/09/2017 0928 - Progressing by Boris Lown, RN   Completed/Met Clinical Measurements: Respiratory complications will improve 06/09/2017 0928 - Completed/Met by Boris Lown, RN Pain Managment: General experience of comfort will improve 06/09/2017 0928 - Completed/Met by Boris Lown, RN

## 2017-06-10 ENCOUNTER — Other Ambulatory Visit: Payer: Self-pay

## 2017-06-10 DIAGNOSIS — I7101 Dissection of thoracic aorta: Secondary | ICD-10-CM

## 2017-06-10 DIAGNOSIS — I71019 Dissection of thoracic aorta, unspecified: Secondary | ICD-10-CM

## 2017-06-10 DIAGNOSIS — R0602 Shortness of breath: Secondary | ICD-10-CM

## 2017-06-10 LAB — BASIC METABOLIC PANEL
Anion gap: 12 (ref 5–15)
BUN: 23 mg/dL — AB (ref 6–20)
CALCIUM: 9 mg/dL (ref 8.9–10.3)
CO2: 22 mmol/L (ref 22–32)
CREATININE: 1.79 mg/dL — AB (ref 0.44–1.00)
Chloride: 101 mmol/L (ref 101–111)
GFR calc Af Amer: 35 mL/min — ABNORMAL LOW (ref >60)
GFR, EST NON AFRICAN AMERICAN: 31 mL/min — AB (ref >60)
GLUCOSE: 135 mg/dL — AB (ref 65–99)
Potassium: 4 mmol/L (ref 3.5–5.1)
SODIUM: 135 mmol/L (ref 135–145)

## 2017-06-10 NOTE — Progress Notes (Signed)
PROGRESS NOTE  Dorothy Harris SEG:[id number] DOB: 1960-06-30 DOA: 05/31/2017 PCP: Patient, No Pcp Per  HPI/Recap of past 24 hours: 57 yo female with hx HTN, cocaine abuse presented 2/24 with chest pain, AMS, HTN crisis. ER w/u included EKG without ischemic changes, Flu POS, CTA chest which revealed type B dissection. UDS neg for cocaine. Pt was started on cardene gtt for aggressive BP management . She is now off Cardene gtt. Pt will be transferred to a SDU bed.  Today, pt denies any new complaints, no chest pain, SOB, headache, abdominal pain, N/V/D/C, diaphoresis.   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 Stable, no chest pain Vascular surgery on board: No plans to repeat CTA while inpt due to AKI, defer until outpt in 2-4 weeks CVTS recommended BP control  Continue amlodipine, coreg, chlorthalidone, clonidine, hydralazine, lisinopril May use combo meds, clonidine patch to aid compliance upon discharge  HTN Urgency Better controlled, aim for SBP of <130 Continue with meds as above, prn IV hydralazine, IV labetolol UDS negative Monitor closely  Troponin elevated Denies chest pain Demand ischemia, in setting HTN urgency Cardiology on board, no further recs  CAP, influenza A   Afebrile Completed 5 days of tamiflu, 7 days of Azithromycin + ceftriaxone   AKI Cr 2.09 on admission  Cr trending up, likely med induced Monitor closely, will stop ACE Vs chlorthalidone if Cr ~2  Acute metabolic encephalopathy Resolved Likely due to malignant HTN  Acute diastolic HF exacerbation Stable BNP 3,000, received lasix  prn Cardiology on board  Anxiety xanax PRN      Code Status: Full  Family Communication: None at bedside  Disposition Plan: Still requires close monitoring of BP and adjustment of meds due to worsening AKI   Consultants:  CVTS  Cardiology  Vascular surgery  Procedures:  None   Antimicrobials:  Tamiflu- antiviral  Ceftriaxone  Azithromycin  DVT prophylaxis:  Heparin [place]   Objective: Vitals:   06/10/17 1009 06/10/17 1100 06/10/17 1131 06/10/17 1200  BP: 99/70 129/76 129/76 136/83  Pulse: (!) 54 (!) 51  (!) 49  Resp: 19 (!) $Remo'21 19 17  'dkIWX$ Temp:   98 F (36.7 C)   TempSrc:   Oral   SpO2: 96% 97%  95%  Weight:      Height:        Intake/Output Summary (Last 24 hours) at 06/10/2017 1228 Last data filed at 06/10/2017 0930 Gross per 24 hour  Intake 240 ml  Output -  Net 240 ml   Filed Weights   06/08/17 0600 06/09/17 0500 06/10/17 0430  Weight: 81.5 kg (179 lb 10.8 oz) 83.2 kg (183 lb 6.8 oz) 79.6 kg (175 lb 7.8 oz)    Exam:   General:  NAD, AOO   Cardiovascular: S1, S2  Respiratory: CTA b/l  Abdomen: Soft, NT, ND, BS+  Musculoskeletal: No pedal edema b/l  Skin: Normal  Psychiatry: Normal mood   Data Reviewed: CBC: Recent Labs  Lab 06/04/17 0312 06/05/17 0735 06/07/17 0406  WBC 3.2* 4.5 5.5  HGB 11.6* 11.8* 12.9  HCT 35.7* 36.3 38.8  MCV 98.1 96.0 95.1  PLT 160 201 315  Basic Metabolic Panel: Recent Labs  Lab 06/06/17 0351 06/07/17 0406 06/08/17 0321 06/09/17 0254 06/10/17 1134  NA 137 137 136 134* 135  K 3.3* 4.2 4.5 4.4 4.0  CL 103 102 104 102 101  CO2 $Re'25 23 23 22 22  'XId$ GLUCOSE 117* 119* 124* 129* 135*  BUN $Re'11 14 15 18 'BrO$ 23*  CREATININE 1.35* 1.43* 1.40* 1.59* 1.79*  CALCIUM 8.3* 8.8* 8.6* 8.9 9.0   GFR: Estimated Creatinine Clearance: 38.1 mL/min (A) (by C-G formula based on SCr of 1.79 mg/dL (H)). Liver Function Tests: No results for input(s): AST, ALT, ALKPHOS, BILITOT, PROT, ALBUMIN in the last 168 hours. No  results for input(s): LIPASE, AMYLASE in the last 168 hours. No results for input(s): AMMONIA in the last 168 hours. Coagulation Profile: No results for input(s): INR, PROTIME in the last 168 hours. Cardiac Enzymes: Recent Labs  Lab 06/03/17 1627 06/03/17 2141 06/04/17 0312  TROPONINI 0.95* 1.01* 0.88*   BNP (last 3 results) No results for input(s): PROBNP in the last 8760 hours. HbA1C: No results for input(s): HGBA1C in the last 72 hours. CBG: No results for input(s): GLUCAP in the last 168 hours. Lipid Profile: No results for input(s): CHOL, HDL, LDLCALC, TRIG, CHOLHDL, LDLDIRECT in the last 72 hours. Thyroid Function Tests: No results for input(s): TSH, T4TOTAL, FREET4, T3FREE, THYROIDAB in the last 72 hours. Anemia Panel: No results for input(s): VITAMINB12, FOLATE, FERRITIN, TIBC, IRON, RETICCTPCT in the last 72 hours. Urine analysis:    Component Value Date/Time   COLORURINE YELLOW 05/31/2017 [place] 05/31/2017 1407   LABSPEC 1.024 05/31/2017 1407   PHURINE 5.0 05/31/2017 1407   GLUCOSEU NEGATIVE 05/31/2017 1407   HGBUR NEGATIVE 05/31/2017 1407   BILIRUBINUR NEGATIVE 05/31/2017 1407   KETONESUR NEGATIVE 05/31/2017 1407   PROTEINUR 30 (A) 05/31/2017 1407   UROBILINOGEN 0.2 05/26/2008 1446   NITRITE NEGATIVE 05/31/2017 1407   LEUKOCYTESUR NEGATIVE 05/31/2017 1407   Sepsis Labs: $RemoveBefo'@LABRCNTIP'vXoUyMoumnm$ (procalcitonin:4,lacticidven:4)  ) Recent Results (from the past 240 hour(s))  Culture, blood (routine x 2)     Status: None   Collection Time: 05/31/17 12:55 PM  Result Value Ref Range Status   Specimen Description BLOOD RIGHT HAND  Final   Special Requests IN PEDIATRIC BOTTLE Blood Culture adequate volume  Final   Culture   Final    NO GROWTH 5 DAYS Performed at [facility], [place] [address] [place], [place] [address]    Report Status 06/05/2017 FINAL  Final  Culture, blood (routine x 2)     Status: None   Collection Time: 05/31/17 12:59 PM   Result Value Ref Range Status   Specimen Description BLOOD RIGHT HAND  Final   Special Requests IN PEDIATRIC BOTTLE Blood Culture adequate volume  Final   Culture   Final    NO GROWTH 5 DAYS Performed at [facility], [place] [address], [place], [place] [address]    Report Status 06/05/2017 FINAL  Final      Studies: No results found.  Scheduled Meds: . amLODipine  10 mg Oral Daily  . carvedilol  25 mg Oral BID WC  . chlorthalidone  50 mg Oral Daily  . cloNIDine  0.3 mg Oral TID  . heparin  5,000 Units Subcutaneous Q8H  . hydrALAZINE  100 mg Oral Q6H  . lisinopril  20 mg Oral Daily  . mouth rinse  15 mL Mouth Rinse BID  . pantoprazole  40 mg Oral BID  . thiamine  100 mg Oral Daily    Continuous Infusions: . sodium chloride Stopped (05/31/17 1600)     LOS: 10 days     Alma Friendly, MD [facility]   If 7PM-7AM, please contact night-coverage [email or web address] Password [facility] 06/10/2017, 12:28 PM

## 2017-06-10 NOTE — Plan of Care (Signed)
  Progressing Clinical Measurements: Ability to maintain clinical measurements within normal limits will improve 06/10/2017 0857 - Progressing by Boris Lown, RN 06/10/2017 [phone] - Progressing by Boris Lown, RN Activity: Risk for activity intolerance will decrease 06/10/2017 0857 - Progressing by Boris Lown, RN 06/10/2017 0856 - Progressing by Boris Lown, RN Coping: Level of anxiety will decrease 06/10/2017 0857 - Progressing by Boris Lown, RN 06/10/2017 0856 - Progressing by Boris Lown, RN Elimination: Will not experience complications related to urinary retention 06/10/2017 0857 - Progressing by Boris Lown, RN 06/10/2017 0856 - Progressing by Boris Lown, RN   Not Progressing Nutrition: Adequate nutrition will be maintained 06/10/2017 0857 - Not Progressing by Boris Lown, RN Note Patient with a poor appetite; only eating 25% of meals. 06/10/2017 7915 - Not Progressing by Boris Lown, RN

## 2017-06-10 NOTE — Plan of Care (Signed)
  Progressing Clinical Measurements: Ability to maintain clinical measurements within normal limits will improve 06/10/2017 0650 - Progressing by Alonna Buckler, RN Diagnostic test results will improve 06/10/2017 0650 - Progressing by Alonna Buckler, RN Cardiovascular complication will be avoided 06/10/2017 0650 - Progressing by Alonna Buckler, RN Nutrition: Adequate nutrition will be maintained 06/10/2017 0650 - Progressing by Alonna Buckler, RN Coping: Level of anxiety will decrease 06/10/2017 0650 - Progressing by Alonna Buckler, RN Elimination: Will not experience complications related to bowel motility 06/10/2017 0650 - Progressing by Alonna Buckler, RN Will not experience complications related to urinary retention 06/10/2017 0650 - Progressing by Alonna Buckler, RN Safety: Ability to remain free from injury will improve 06/10/2017 0650 - Progressing by Alonna Buckler, RN Skin Integrity: Risk for impaired skin integrity will decrease 06/10/2017 0650 - Progressing by Alonna Buckler, RN   Not Progressing Activity: Risk for activity intolerance will decrease 06/10/2017 0650 - Not Progressing by Alonna Buckler, RN Note Pt sat up in chair for 73 minutes this morning, was encouraged to stay up longer, but declined and insisted on returning back to bed. Education provided on mobility and health improvement, pt verbalizes understanding.

## 2017-06-11 LAB — BASIC METABOLIC PANEL
Anion gap: 10 (ref 5–15)
BUN: 26 mg/dL — ABNORMAL HIGH (ref 6–20)
CHLORIDE: 102 mmol/L (ref 101–111)
CO2: 21 mmol/L — ABNORMAL LOW (ref 22–32)
CREATININE: 1.98 mg/dL — AB (ref 0.44–1.00)
Calcium: 8.8 mg/dL — ABNORMAL LOW (ref 8.9–10.3)
GFR calc non Af Amer: 27 mL/min — ABNORMAL LOW (ref >60)
GFR, EST AFRICAN AMERICAN: 31 mL/min — AB (ref >60)
Glucose, Bld: 129 mg/dL — ABNORMAL HIGH (ref 65–99)
Potassium: 4.1 mmol/L (ref 3.5–5.1)
SODIUM: 133 mmol/L — AB (ref 135–145)

## 2017-06-11 NOTE — Plan of Care (Signed)
  Progressing Health Behavior/Discharge Planning: Ability to manage health-related needs will improve 06/11/2017 2047 - Progressing by Drenda Freeze, RN Clinical Measurements: Ability to maintain clinical measurements within normal limits will improve 06/11/2017 2047 - Progressing by Drenda Freeze, RN Diagnostic test results will improve 06/11/2017 2047 - Progressing by Drenda Freeze, RN Cardiovascular complication will be avoided 06/11/2017 2047 - Progressing by Drenda Freeze, RN

## 2017-06-11 NOTE — Progress Notes (Signed)
PROGRESS NOTE  Dorothy Harris HFW:[id number] DOB: 1960-07-07 DOA: 05/31/2017 PCP: Patient, No Pcp Per  HPI/Recap of past 24 hours: 57 yo female with hx HTN, cocaine abuse presented 2/24 with chest pain, AMS, HTN crisis. ER w/u included EKG without ischemic changes, Flu POS, CTA chest which revealed type B dissection. UDS neg for cocaine. Pt was started on cardene gtt for aggressive BP management . She is now off Cardene gtt. Pt will be transferred to a SDU bed.  Today, pt denies BP with better control. 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 Stable, no chest pain Vascular surgery on board: No plans to repeat CTA while inpt due to AKI, defer until outpt in 2-4 weeks CVTS recommended BP control  Continue amlodipine, coreg, chlorthalidone, clonidine, hydralazine, d/c lisinopril due to worsening AKI May use combo meds, clonidine patch to aid compliance upon discharge  HTN Urgency Better controlled, aim for SBP of <130 Continue with meds as above, d/c lisinopril due to worsening AKI, prn IV hydralazine, IV labetolol UDS negative Monitor closely  Troponin elevated Denies chest pain Demand ischemia, in setting HTN urgency Cardiology on board, no further recs  CAP, influenza A   Afebrile Completed 5 days of tamiflu, 7 days of Azithromycin + ceftriaxone   AKI Cr 2.09 on admission  Cr trending up, likely med induced Monitor closely, stopped lisinopril  Acute metabolic encephalopathy Resolved Likely due to malignant HTN  Acute diastolic HF  exacerbation Stable BNP 3,000, received lasix prn Cardiology on board  Anxiety xanax PRN      Code Status: Full  Family Communication: None at bedside  Disposition Plan: Still requires close monitoring of BP and adjustment of meds due to worsening AKI   Consultants:  CVTS  Cardiology  Vascular surgery  Procedures:  None   Antimicrobials:  Tamiflu- antiviral  Ceftriaxone  Azithromycin  DVT prophylaxis:  Heparin [place]   Objective: Vitals:   06/11/17 0749 06/11/17 0849 06/11/17 1130 06/11/17 1632  BP: 125/81 94/66 113/79   Pulse: (!) 53 (!) 56 (!) 55   Resp: (!) $RemoveB'22 17 19   'OdzLeGxB$ Temp: 98.7 F (37.1 C)  97.9 F (36.6 C) 98.1 F (36.7 C)  TempSrc: Oral  Oral Oral  SpO2: 94% 95% 99%   Weight:      Height:        Intake/Output Summary (Last 24 hours) at 06/11/2017 1632 Last data filed at 06/11/2017 1632 Gross per 24 hour  Intake 720 ml  Output 900 ml  Net -180 ml   Filed Weights   06/09/17 0500 06/10/17 0430 06/11/17 0252  Weight: 83.2 kg (183 lb 6.8 oz) 79.6 kg (175 lb 7.8 oz) 79.2 kg (174 lb 9.6 oz)    Exam:   General:  NAD, AOO   Cardiovascular: S1, S2  Respiratory: CTA b/l  Abdomen: Soft, NT, ND, BS+, Large hernia present  Musculoskeletal: No pedal edema b/l  Skin: Normal  Psychiatry: Normal mood   Data Reviewed: CBC: Recent Labs  Lab 06/05/17 0735 06/07/17 0406  WBC 4.5 5.5  HGB 11.8* 12.9  HCT 36.3 38.8  MCV 96.0 95.1  PLT 201 332   Basic Metabolic Panel: Recent Labs  Lab 06/07/17 0406 06/08/17 0321 06/09/17 0254 06/10/17 1134 06/11/17 0332  NA 137 136 134* 135 133*  K 4.2 4.5 4.4 4.0 4.1  CL 102 104 102 101 102  CO2 $Re'23 23 22 22 'Fbr$ 21*  GLUCOSE 119* 124* 129* 135* 129*  BUN $Re'14 15 18 'SGk$ 23* 26*  CREATININE 1.43* 1.40* 1.59* 1.79* 1.98*  CALCIUM 8.8* 8.6* 8.9 9.0 8.8*   GFR: Estimated Creatinine Clearance: 34.4 mL/min (A) (by C-G formula based on SCr of 1.98 mg/dL (H)). Liver Function Tests: No results for input(s):  AST, ALT, ALKPHOS, BILITOT, PROT, ALBUMIN in the last 168 hours. No results for input(s): LIPASE, AMYLASE in the last 168 hours. No results for input(s): AMMONIA in the last 168 hours. Coagulation Profile: No results for input(s): INR, PROTIME in the last 168 hours. Cardiac Enzymes: No results for input(s): CKTOTAL, CKMB, CKMBINDEX, TROPONINI in the last 168 hours. BNP (last 3 results) No results for input(s): PROBNP in the last 8760 hours. HbA1C: No results for input(s): HGBA1C in the last 72 hours. CBG: No results for input(s): GLUCAP in the last 168 hours. Lipid Profile: No results for input(s): CHOL, HDL, LDLCALC, TRIG, CHOLHDL, LDLDIRECT in the last 72 hours. Thyroid Function Tests: No results for input(s): TSH, T4TOTAL, FREET4, T3FREE, THYROIDAB in the last 72 hours. Anemia Panel: No results for input(s): VITAMINB12, FOLATE, FERRITIN, TIBC, IRON, RETICCTPCT in the last 72 hours. Urine analysis:    Component Value Date/Time   COLORURINE YELLOW 05/31/2017 [place] 05/31/2017 1407   LABSPEC 1.024 05/31/2017 1407   PHURINE 5.0 05/31/2017 1407   GLUCOSEU NEGATIVE 05/31/2017 1407   HGBUR NEGATIVE 05/31/2017 1407   BILIRUBINUR NEGATIVE 05/31/2017 1407   KETONESUR NEGATIVE 05/31/2017 1407   PROTEINUR 30 (A) 05/31/2017 1407   UROBILINOGEN 0.2 05/26/2008 1446   NITRITE NEGATIVE 05/31/2017 1407   LEUKOCYTESUR NEGATIVE 05/31/2017 1407   Sepsis Labs: $RemoveBefo'@[email or web address]$ (procalcitonin:4,lacticidven:4)  ) No results found for this or any previous visit (from the past 240 hour(s)).    Studies: No results found.  Scheduled Meds: . amLODipine  10 mg Oral Daily  . carvedilol  25 mg Oral BID WC  . chlorthalidone  50 mg Oral Daily  . cloNIDine  0.3 mg Oral TID  . heparin  5,000 Units Subcutaneous Q8H  . hydrALAZINE  100 mg Oral Q6H  . mouth rinse  15 mL Mouth Rinse BID  . pantoprazole  40 mg Oral BID  . thiamine  100 mg Oral Daily    Continuous Infusions: . sodium  chloride Stopped (05/31/17 1600)     LOS: 11 days     Alma Friendly, MD [facility]   If 7PM-7AM, please contact night-coverage [email or web address] Password [facility] 06/11/2017, 4:32 PM

## 2017-06-11 NOTE — Progress Notes (Signed)
  Progress Note    06/11/2017 7:38 AM * No surgery found *  Subjective: no new complaints  Vitals:   06/11/17 0255 06/11/17 0613  BP: 131/84 124/81  Pulse: 60   Resp: (!) 23   Temp:    SpO2: 94%     Physical Exam: aaox3 Abdomen is soft Palpable femoral pulses  CBC    Component Value Date/Time   WBC 5.5 06/07/2017 0406   RBC 4.08 06/07/2017 0406   HGB 12.9 06/07/2017 0406   HCT 38.8 06/07/2017 0406   PLT 315 06/07/2017 0406   MCV 95.1 06/07/2017 0406   MCH 31.6 06/07/2017 0406   MCHC 33.2 06/07/2017 0406   RDW 15.5 06/07/2017 0406   LYMPHSABS 0.1 (L) 05/31/2017 1252   MONOABS 0.9 05/31/2017 1252   EOSABS 0.0 05/31/2017 1252   BASOSABS 0.0 05/31/2017 1252    BMET    Component Value Date/Time   NA 133 (L) 06/11/2017 0332   K 4.1 06/11/2017 0332   CL 102 06/11/2017 0332   CO2 21 (L) 06/11/2017 0332   GLUCOSE 129 (H) 06/11/2017 0332   BUN 26 (H) 06/11/2017 0332   CREATININE 1.98 (H) 06/11/2017 0332   CALCIUM 8.8 (L) 06/11/2017 0332   GFRNONAA 27 (L) 06/11/2017 0332   GFRAA 31 (L) 06/11/2017 0332    INR    Component Value Date/Time   INR 1.04 12/23/2015 1040     Intake/Output Summary (Last 24 hours) at 06/11/2017 0738 Last data filed at 06/11/2017 0100 Gross per 24 hour  Intake 480 ml  Output 600 ml  Net -120 ml     Assessment:  57 y.o. female is here with tbad Plan: bp is better controlled Cr continues to trend up She has f/u in office tentatively for 3.29 with repeat CTA given renal function.   Cherlyn Syring C. Donzetta Matters, MD [facility] [place] Office: [phone] Pager: [phone]  06/11/2017 7:38 AM
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
## 2017-06-12 DIAGNOSIS — J189 Pneumonia, unspecified organism: Secondary | ICD-10-CM

## 2017-06-12 LAB — BASIC METABOLIC PANEL
Anion gap: 12 (ref 5–15)
BUN: 39 mg/dL — AB (ref 6–20)
CHLORIDE: 103 mmol/L (ref 101–111)
CO2: 20 mmol/L — AB (ref 22–32)
Calcium: 8.8 mg/dL — ABNORMAL LOW (ref 8.9–10.3)
Creatinine, Ser: 2.55 mg/dL — ABNORMAL HIGH (ref 0.44–1.00)
GFR calc Af Amer: 23 mL/min — ABNORMAL LOW (ref >60)
GFR calc non Af Amer: 20 mL/min — ABNORMAL LOW (ref >60)
GLUCOSE: 142 mg/dL — AB (ref 65–99)
POTASSIUM: 4.1 mmol/L (ref 3.5–5.1)
Sodium: 135 mmol/L (ref 135–145)

## 2017-06-12 MED ORDER — SODIUM CHLORIDE 0.9 % IV SOLN
INTRAVENOUS | Status: AC
  Administered 2017-06-12: 09:00:00 via INTRAVENOUS

## 2017-06-12 NOTE — Progress Notes (Addendum)
PROGRESS NOTE  Dorothy Harris DGL:[id number] DOB: July 01, 1960 DOA: 05/31/2017 PCP: Patient, No Pcp Per  HPI/Recap of past 24 hours: 57 yo female with hx HTN, cocaine abuse presented 2/24 with chest pain, AMS, HTN crisis. ER w/u included EKG without ischemic changes, Flu POS, CTA chest which revealed type B dissection. UDS neg for cocaine. Pt was started on cardene gtt for aggressive BP management . She is now off Cardene gtt. Pt will be transferred to a SDU bed.  Today, pt noted to be orthostatic but asymptomatic, with worsening AKI. BP with better control. Denies chest pain, SOB, headache, abdominal pain, N/V/D/C.   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 Stable, no chest pain Vascular surgery on board: No plans to repeat CTA while inpt due to worsening AKI, defer until outpt in 2-4 weeks CVTS recommended BP control  Continue amlodipine, coreg, clonidine, hydralazine, d/c lisinopril and chlorthalidone due to worsening AKI May use combo meds, clonidine patch to aid compliance upon discharge  HTN Urgency Better controlled, aim for SBP of <130 Continue with meds as above, d/c lisinopril and chlorthalidone due to worsening AKI UDS negative Monitor closely  Troponin elevated Denies chest pain Demand ischemia, in setting HTN urgency Cardiology on board, no further recs  CAP, influenza A   Afebrile Completed 5 days of tamiflu, 7 days of Azithromycin + ceftriaxone   AKI Cr 2.09 on admission  Worsening, 2.5, likely med induced Vs renal malperfusion  Monitor closely, stopped lisinopril and chlorthalidone Renal artery duplex  pending  Started IVF for 1 day  Acute metabolic encephalopathy Resolved Likely due to malignant HTN  Acute diastolic HF exacerbation Stable BNP 3,000, received lasix prn Cardiology on board  Anxiety Xanax PRN      Code Status: Full  Family Communication: None at bedside  Disposition Plan: Still requires close monitoring of BP and adjustment of meds due to worsening AKI   Consultants:  CVTS  Cardiology  Vascular surgery  Procedures:  None   Antimicrobials:  Tamiflu- antiviral  Ceftriaxone  Azithromycin  DVT prophylaxis:  Heparin [place]   Objective: Vitals:   06/12/17 0428 06/12/17 0558 06/12/17 1100 06/12/17 1209  BP: 113/70  110/70 102/68  Pulse: (!) 54 (!) 51  (!) 55  Resp: 17 (!) 24 (!) 22 (!) 22  Temp: 98.5 F (36.9 C)   97.8 F (36.6 C)  TempSrc: Oral   Oral  SpO2: 94% 96%    Weight:  81.3 kg (179 lb 4.8 oz)    Height:        Intake/Output Summary (Last 24 hours) at 06/12/2017 1251 Last data filed at 06/12/2017 1210 Gross per 24 hour  Intake 600 ml  Output 300 ml  Net 300 ml   Filed Weights   06/10/17 0430 06/11/17 0252 06/12/17 0558  Weight: 79.6 kg (175 lb 7.8 oz) 79.2 kg (174 lb 9.6 oz) 81.3 kg (179 lb 4.8 oz)    Exam:   General:  NAD, AOO   Cardiovascular: S1, S2  Respiratory: CTA b/l  Abdomen: Soft, NT, ND, BS+, Large hernia present  Musculoskeletal: No pedal edema b/l  Skin: Normal  Psychiatry: Normal mood  Data Reviewed: CBC: Recent Labs  Lab 06/07/17 0406  WBC 5.5  HGB 12.9  HCT 38.8  MCV 95.1  PLT 707   Basic Metabolic Panel: Recent Labs  Lab 06/08/17 0321 06/09/17 0254 06/10/17 1134 06/11/17 0332 06/12/17 0431  NA 136 134* 135 133* 135  K 4.5 4.4 4.0 4.1 4.1  CL 104 102 101 102 103  CO2 $Re'23 22 22 'nrU$ 21* 20*  GLUCOSE 124* 129* 135* 129* 142*  BUN 15 18 23* 26* 39*  CREATININE 1.40* 1.59* 1.79* 1.98* 2.55*  CALCIUM 8.6* 8.9 9.0 8.8* 8.8*   GFR: Estimated Creatinine Clearance: 27 mL/min (A)  (by C-G formula based on SCr of 2.55 mg/dL (H)). Liver Function Tests: No results for input(s): AST, ALT, ALKPHOS, BILITOT, PROT, ALBUMIN in the last 168 hours. No results for input(s): LIPASE, AMYLASE in the last 168 hours. No results for input(s): AMMONIA in the last 168 hours. Coagulation Profile: No results for input(s): INR, PROTIME in the last 168 hours. Cardiac Enzymes: No results for input(s): CKTOTAL, CKMB, CKMBINDEX, TROPONINI in the last 168 hours. BNP (last 3 results) No results for input(s): PROBNP in the last 8760 hours. HbA1C: No results for input(s): HGBA1C in the last 72 hours. CBG: No results for input(s): GLUCAP in the last 168 hours. Lipid Profile: No results for input(s): CHOL, HDL, LDLCALC, TRIG, CHOLHDL, LDLDIRECT in the last 72 hours. Thyroid Function Tests: No results for input(s): TSH, T4TOTAL, FREET4, T3FREE, THYROIDAB in the last 72 hours. Anemia Panel: No results for input(s): VITAMINB12, FOLATE, FERRITIN, TIBC, IRON, RETICCTPCT in the last 72 hours. Urine analysis:    Component Value Date/Time   COLORURINE YELLOW 05/31/2017 [place] 05/31/2017 1407   LABSPEC 1.024 05/31/2017 1407   PHURINE 5.0 05/31/2017 1407   GLUCOSEU NEGATIVE 05/31/2017 1407   HGBUR NEGATIVE 05/31/2017 1407   BILIRUBINUR NEGATIVE 05/31/2017 1407   KETONESUR NEGATIVE 05/31/2017 1407   PROTEINUR 30 (A) 05/31/2017 1407   UROBILINOGEN 0.2 05/26/2008 1446   NITRITE NEGATIVE 05/31/2017 1407   LEUKOCYTESUR NEGATIVE 05/31/2017 1407   Sepsis Labs: $RemoveBefo'[email or web address]$ (procalcitonin:4,lacticidven:4)  ) No results found for this or any previous visit (from the past 240 hour(s)).    Studies: No results found.  Scheduled Meds: . amLODipine  10 mg Oral Daily  . carvedilol  25 mg Oral BID WC  . cloNIDine  0.3 mg Oral TID  . heparin  5,000 Units Subcutaneous Q8H  . hydrALAZINE  100 mg Oral Q6H  . mouth rinse  15 mL Mouth Rinse BID  . pantoprazole  40 mg Oral BID  .  thiamine  100 mg Oral Daily    Continuous Infusions: . sodium chloride Stopped (05/31/17 1600)  . sodium chloride 100 mL/hr at 06/12/17 0857     LOS: 12 days     Alma Friendly, MD [facility]   If 7PM-7AM, please contact night-coverage [email or web address] Password [facility] 06/12/2017, 12:51 PM
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
## 2017-06-12 NOTE — Progress Notes (Signed)
  Progress Note    06/12/2017 8:08 AM * No surgery found *  Subjective: no overnight issues  Vitals:   06/12/17 0428 06/12/17 0558  BP: 113/70   Pulse: (!) 54 (!) 51  Resp: 17 (!) 24  Temp: 98.5 F (36.9 C)   SpO2: 94% 96%    Physical Exam: aaox3 Palpable radial and femoral pulses Abdomen is soft  CBC    Component Value Date/Time   WBC 5.5 06/07/2017 0406   RBC 4.08 06/07/2017 0406   HGB 12.9 06/07/2017 0406   HCT 38.8 06/07/2017 0406   PLT 315 06/07/2017 0406   MCV 95.1 06/07/2017 0406   MCH 31.6 06/07/2017 0406   MCHC 33.2 06/07/2017 0406   RDW 15.5 06/07/2017 0406   LYMPHSABS 0.1 (L) 05/31/2017 1252   MONOABS 0.9 05/31/2017 1252   EOSABS 0.0 05/31/2017 1252   BASOSABS 0.0 05/31/2017 1252    BMET    Component Value Date/Time   NA 135 06/12/2017 0431   K 4.1 06/12/2017 0431   CL 103 06/12/2017 0431   CO2 20 (L) 06/12/2017 0431   GLUCOSE 142 (H) 06/12/2017 0431   BUN 39 (H) 06/12/2017 0431   CREATININE 2.55 (H) 06/12/2017 0431   CALCIUM 8.8 (L) 06/12/2017 0431   GFRNONAA 20 (L) 06/12/2017 0431   GFRAA 23 (L) 06/12/2017 0431    INR    Component Value Date/Time   INR 1.04 12/23/2015 1040     Intake/Output Summary (Last 24 hours) at 06/12/2017 4917 Last data filed at 06/11/2017 1632 Gross per 24 hour  Intake 360 ml  Output 300 ml  Net 60 ml     Assessment:  57 y.o. female is here tbad, worsening renal function  Plan: Unlikely that elevated BUN/Cr from renal malperfusion but could get renal duplex to rule out. Avoiding repeat CTA for now. BP is much better controlled. Will re-visit Monday morning.    Dorothy Harris C. Donzetta Matters, MD [facility] Office: [phone] Pager: [phone]  06/12/2017 8:08 AM

## 2017-06-12 NOTE — Care Management Note (Addendum)
Case Management Note  Patient Details  Name: Dorothy Harris MRN: [id number] Date of Birth: [birth year]  Subjective/Objective:    Descending Thoracic Dissection, Aortic Dissection, HTN crisis, PNA, HF, CAD, AKI              Action/Plan: NCM spoke to pt and she lives alone. No DME needed. Pt states she will need a note for work. Pt will benefit HH RN, Offered choice for HH/list provided. Pt states she does not have a PCP. Pt requested NCM arrange appt. Contacted Dr Roma Kayser office and Dr Gerarda Fraction to arrange appt. Office will review and call pt to arrange a follow up appt if they are able to accept. Updated attending.    Offered choice for HH/list provided. Pt agreeable to [facility] for HH. Updated pt on PCP's and appt.   Expected Discharge Date:                 Expected Discharge Plan:  [place]  In-House Referral:  NA  Discharge planning Services  CM Consult  Post Acute Care Choice:  Home Health Choice offered to:  Patient  DME Arranged:  N/A DME Agency:  NA  HH Arranged:  RN [place] Agency:     Status of Service:  In process, will continue to follow  If discussed at Long Length of Stay Meetings, dates discussed:    Additional Comments:  Erenest Rasher, RN 06/12/2017, 10:00 AM

## 2017-06-13 ENCOUNTER — Inpatient Hospital Stay (HOSPITAL_COMMUNITY): Payer: BLUE CROSS/BLUE SHIELD

## 2017-06-13 DIAGNOSIS — I169 Hypertensive crisis, unspecified: Secondary | ICD-10-CM

## 2017-06-13 LAB — BASIC METABOLIC PANEL
Anion gap: 10 (ref 5–15)
BUN: 39 mg/dL — AB (ref 6–20)
CO2: 20 mmol/L — AB (ref 22–32)
Calcium: 8.9 mg/dL (ref 8.9–10.3)
Chloride: 106 mmol/L (ref 101–111)
Creatinine, Ser: 2.11 mg/dL — ABNORMAL HIGH (ref 0.44–1.00)
GFR calc Af Amer: 29 mL/min — ABNORMAL LOW (ref >60)
GFR, EST NON AFRICAN AMERICAN: 25 mL/min — AB (ref >60)
Glucose, Bld: 135 mg/dL — ABNORMAL HIGH (ref 65–99)
POTASSIUM: 4.3 mmol/L (ref 3.5–5.1)
Sodium: 136 mmol/L (ref 135–145)

## 2017-06-13 MED ORDER — SODIUM CHLORIDE 0.9 % IV SOLN
INTRAVENOUS | Status: DC
  Administered 2017-06-13: 100 mL/h via INTRAVENOUS

## 2017-06-13 NOTE — Progress Notes (Signed)
Renal artery duplex has been completed. 1-59% stenosis of the right and left renal arteries by velocity.  06/13/17 9:20 AM Dorothy Harris RVT

## 2017-06-13 NOTE — Progress Notes (Signed)
PROGRESS NOTE  Dorothy Harris FYB:[id number] DOB: 06/20/1960 DOA: 05/31/2017 PCP: Patient, No Pcp Per  HPI/Recap of past 24 hours: 57 yo female with hx HTN, cocaine abuse presented 2/24 with chest pain, AMS, HTN crisis. ER w/u included EKG without ischemic changes, Flu POS, CTA chest which revealed type B dissection. UDS neg for cocaine. Pt was started on cardene gtt for aggressive BP management . She is now off Cardene gtt. Pt will be transferred to a SDU bed.  Today, pt denies any new symptoms, eager to go home due to work. Denies chest pain, SOB, headache, abdominal pain, N/V/D/C.   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 Stable, no chest pain Vascular surgery on board: No plans to repeat CTA while inpt due to worsening AKI, defer until outpt in 2-4 weeks CVTS recommended BP control  Continue amlodipine, coreg, clonidine, hydralazine, d/c lisinopril and chlorthalidone due to worsening AKI May use combo meds, clonidine patch to aid compliance upon discharge  HTN Urgency Better controlled, aim for SBP of <130 Continue with meds as above, d/c lisinopril and chlorthalidone due to worsening AKI UDS negative Monitor closely  Troponin elevated Denies chest pain Demand ischemia, in setting HTN urgency Cardiology on board, no further recs  CAP, influenza A   Afebrile Completed 5 days of tamiflu, 7 days of Azithromycin + ceftriaxone   AKI Cr 2.09 on admission  Slowly improving 2.1, likely med induced Vs renal malperfusion  Monitor closely, stopped lisinopril and chlorthalidone Renal artery duplex pending official  report Continue IVF  Acute metabolic encephalopathy Resolved Likely due to malignant HTN  Acute diastolic HF exacerbation Stable BNP 3,000, received lasix prn Cardiology on board Will repeat CXR in am, to monitor for overload  Anxiety Xanax PRN      Code Status: Full  Family Communication: None at bedside  Disposition Plan: Still requires close monitoring of BP and adjustment of meds due to worsening AKI   Consultants:  CVTS  Cardiology  Vascular surgery  Procedures:  None   Antimicrobials:  Tamiflu- antiviral  Ceftriaxone  Azithromycin  DVT prophylaxis:  Heparin [place]   Objective: Vitals:   06/12/17 2330 06/13/17 0444 06/13/17 1123 06/13/17 1202  BP: 117/78 138/85  135/89  Pulse: (!) 53 (!) 57 74 (!) 56  Resp: 17 (!) 26    Temp: 97.7 F (36.5 C) 97.8 F (36.6 C)    TempSrc: Oral Oral    SpO2: 95%     Weight:  79.6 kg (175 lb 8 oz)    Height:        Intake/Output Summary (Last 24 hours) at 06/13/2017 1515 Last data filed at 06/12/2017 1700 Gross per 24 hour  Intake 120 ml  Output 300 ml  Net -180 ml   Filed Weights   06/11/17 0252 06/12/17 0558 06/13/17 0444  Weight: 79.2 kg (174 lb 9.6 oz) 81.3 kg (179 lb 4.8 oz) 79.6 kg (175 lb 8 oz)    Exam:   General:  NAD, AOO   Cardiovascular: S1, S2  Respiratory: CTA b/l  Abdomen: Soft, NT, ND, BS+, Large hernia present  Musculoskeletal: No pedal edema b/l  Skin: Normal  Psychiatry: Normal mood   Data Reviewed: CBC: Recent Labs  Lab 06/07/17 0406  WBC 5.5  HGB 12.9  HCT 38.8  MCV 95.1  PLT 951   Basic Metabolic Panel: Recent Labs  Lab 06/09/17 0254 06/10/17 1134 06/11/17 0332 06/12/17 0431 06/13/17 0241  NA 134* 135 133* 135 136  K 4.4 4.0 4.1 4.1 4.3  CL 102 101 102 103 106  CO2 22 22 21* 20* 20*  GLUCOSE 129* 135* 129* 142* 135*  BUN 18 23* 26* 39* 39*  CREATININE 1.59* 1.79* 1.98* 2.55* 2.11*  CALCIUM 8.9 9.0 8.8* 8.8* 8.9   GFR: Estimated Creatinine  Clearance: 32.3 mL/min (A) (by C-G formula based on SCr of 2.11 mg/dL (H)). Liver Function Tests: No results for input(s): AST, ALT, ALKPHOS, BILITOT, PROT, ALBUMIN in the last 168 hours. No results for input(s): LIPASE, AMYLASE in the last 168 hours. No results for input(s): AMMONIA in the last 168 hours. Coagulation Profile: No results for input(s): INR, PROTIME in the last 168 hours. Cardiac Enzymes: No results for input(s): CKTOTAL, CKMB, CKMBINDEX, TROPONINI in the last 168 hours. BNP (last 3 results) No results for input(s): PROBNP in the last 8760 hours. HbA1C: No results for input(s): HGBA1C in the last 72 hours. CBG: No results for input(s): GLUCAP in the last 168 hours. Lipid Profile: No results for input(s): CHOL, HDL, LDLCALC, TRIG, CHOLHDL, LDLDIRECT in the last 72 hours. Thyroid Function Tests: No results for input(s): TSH, T4TOTAL, FREET4, T3FREE, THYROIDAB in the last 72 hours. Anemia Panel: No results for input(s): VITAMINB12, FOLATE, FERRITIN, TIBC, IRON, RETICCTPCT in the last 72 hours. Urine analysis:    Component Value Date/Time   COLORURINE YELLOW 05/31/2017 [place] 05/31/2017 1407   LABSPEC 1.024 05/31/2017 1407   PHURINE 5.0 05/31/2017 1407   GLUCOSEU NEGATIVE 05/31/2017 1407   HGBUR NEGATIVE 05/31/2017 1407   BILIRUBINUR NEGATIVE 05/31/2017 1407   KETONESUR NEGATIVE 05/31/2017 1407   PROTEINUR 30 (A) 05/31/2017 1407   UROBILINOGEN 0.2 05/26/2008 1446   NITRITE NEGATIVE 05/31/2017 1407   LEUKOCYTESUR NEGATIVE 05/31/2017 1407   Sepsis Labs: $[email or web address]'[email or web address]$ (procalcitonin:4,lacticidven:4)  ) No results found for this or any previous visit (from the past 240 hour(s)).    Studies: No results found.  Scheduled Meds: . amLODipine  10 mg Oral Daily  . carvedilol  25 mg Oral BID WC  . cloNIDine  0.3 mg Oral TID  . heparin  5,000 Units Subcutaneous Q8H  . hydrALAZINE  100 mg Oral Q6H  . mouth rinse  15 mL Mouth Rinse BID  . pantoprazole   40 mg Oral BID  . thiamine  100 mg Oral Daily    Continuous Infusions: . sodium chloride Stopped (05/31/17 1600)     LOS: 13 days     Alma Friendly, MD [facility]   If 7PM-7AM, please contact night-coverage [email or web address] Password [email or web address] 06/13/2017, 3:15 PM

## 2017-06-14 LAB — BASIC METABOLIC PANEL
Anion gap: 8 (ref 5–15)
BUN: 30 mg/dL — AB (ref 6–20)
CALCIUM: 8.8 mg/dL — AB (ref 8.9–10.3)
CO2: 20 mmol/L — ABNORMAL LOW (ref 22–32)
Chloride: 107 mmol/L (ref 101–111)
Creatinine, Ser: 1.73 mg/dL — ABNORMAL HIGH (ref 0.44–1.00)
GFR calc Af Amer: 37 mL/min — ABNORMAL LOW (ref >60)
GFR, EST NON AFRICAN AMERICAN: 32 mL/min — AB (ref >60)
GLUCOSE: 135 mg/dL — AB (ref 65–99)
Potassium: 4.2 mmol/L (ref 3.5–5.1)
Sodium: 135 mmol/L (ref 135–145)

## 2017-06-14 NOTE — Progress Notes (Signed)
PROGRESS NOTE  ROSEZETTA Harris EXH:[id number] DOB: [birth year] DOA: 05/31/2017 PCP: Patient, No Pcp Per  HPI/Recap of past 24 hours: 57 yo female with hx HTN, cocaine abuse presented 2/24 with chest pain, AMS, HTN crisis. ER w/u included EKG without ischemic changes, Flu POS, CTA chest which revealed type B dissection. UDS neg for cocaine. Pt was started on cardene gtt for aggressive BP management . She is now off Cardene gtt. Pt will be transferred to a SDU bed.  Today, pt denies any new symptoms, asking when to leave. Cr is coming down nicely. Denies chest pain, SOB, headache, abdominal pain, N/V/D/C.   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 Stable, no chest pain Vascular surgery on board: No plans to repeat CTA while inpt due to worsening AKI, defer until outpt in 2-4 weeks CVTS recommended BP control  Continue amlodipine, coreg, clonidine, hydralazine, d/c lisinopril and chlorthalidone due to worsening AKI May use combo meds, clonidine patch to aid compliance upon discharge  HTN Urgency Better controlled, aim for SBP of <130 Continue with meds as above, d/c lisinopril and chlorthalidone due to worsening AKI UDS negative Monitor closely  Troponin elevated Denies chest pain Demand ischemia, in setting HTN urgency Cardiology on board, no further recs  CAP, influenza A   Afebrile Completed 5 days of tamiflu, 7 days of Azithromycin + ceftriaxone   AKI Cr 2.09 on admission  Improving 1.73, likely med induced  Monitor closely, stopped lisinopril and chlorthalidone Renal artery duplex WNL Continue gentle  hydration  Acute metabolic encephalopathy Resolved Likely due to malignant HTN  Acute diastolic HF exacerbation Stable BNP 3,000, received lasix prn Cardiology on board  Anxiety Xanax PRN      Code Status: Full  Family Communication: None at bedside  Disposition Plan: Still requires close monitoring of BP and adjustment of meds due to AKI, plan for d/c 06/15/17   Consultants:  CVTS  Cardiology  Vascular surgery  Procedures:  None   Antimicrobials:  Tamiflu- antiviral  Ceftriaxone  Azithromycin  DVT prophylaxis:  Heparin [place]   Objective: Vitals:   06/13/17 2320 06/14/17 0325 06/14/17 0330 06/14/17 0806  BP: (!) 145/88 (!) 148/95  130/76  Pulse: (!) 56 (!) 59  (!) 56  Resp: (!) 23 (!) 23    Temp: 98.5 F (36.9 C) 98.8 F (37.1 C)    TempSrc: Oral Oral    SpO2: 97% 90%    Weight:   80.5 kg (177 lb 6.4 oz)   Height:        Intake/Output Summary (Last 24 hours) at 06/14/2017 1045 Last data filed at 06/14/2017 0400 Gross per 24 hour  Intake 1585 ml  Output -  Net 1585 ml   Filed Weights   06/12/17 0558 06/13/17 0444 06/14/17 0330  Weight: 81.3 kg (179 lb 4.8 oz) 79.6 kg (175 lb 8 oz) 80.5 kg (177 lb 6.4 oz)    Exam:   General:  NAD, AOO   Cardiovascular: S1, S2  Respiratory: CTA b/l  Abdomen: Soft, NT, ND, BS+, Large hernia present  Musculoskeletal: No pedal edema b/l  Skin: Normal  Psychiatry: Normal mood   Data  Reviewed: CBC: No results for input(s): WBC, NEUTROABS, HGB, HCT, MCV, PLT in the last 168 hours. Basic Metabolic Panel: Recent Labs  Lab 06/10/17 1134 06/11/17 0332 06/12/17 0431 06/13/17 0241 06/14/17 0356  NA 135 133* 135 136 135  K 4.0 4.1 4.1 4.3 4.2  CL 101 102 103 106 107  CO2 22 21* 20* 20* 20*  GLUCOSE 135* 129* 142* 135* 135*  BUN 23* 26* 39* 39* 30*  CREATININE 1.79* 1.98* 2.55* 2.11* 1.73*  CALCIUM 9.0 8.8* 8.8* 8.9 8.8*   GFR: Estimated Creatinine Clearance: 39.7 mL/min (A) (by C-G formula  based on SCr of 1.73 mg/dL (H)). Liver Function Tests: No results for input(s): AST, ALT, ALKPHOS, BILITOT, PROT, ALBUMIN in the last 168 hours. No results for input(s): LIPASE, AMYLASE in the last 168 hours. No results for input(s): AMMONIA in the last 168 hours. Coagulation Profile: No results for input(s): INR, PROTIME in the last 168 hours. Cardiac Enzymes: No results for input(s): CKTOTAL, CKMB, CKMBINDEX, TROPONINI in the last 168 hours. BNP (last 3 results) No results for input(s): PROBNP in the last 8760 hours. HbA1C: No results for input(s): HGBA1C in the last 72 hours. CBG: No results for input(s): GLUCAP in the last 168 hours. Lipid Profile: No results for input(s): CHOL, HDL, LDLCALC, TRIG, CHOLHDL, LDLDIRECT in the last 72 hours. Thyroid Function Tests: No results for input(s): TSH, T4TOTAL, FREET4, T3FREE, THYROIDAB in the last 72 hours. Anemia Panel: No results for input(s): VITAMINB12, FOLATE, FERRITIN, TIBC, IRON, RETICCTPCT in the last 72 hours. Urine analysis:    Component Value Date/Time   COLORURINE YELLOW 05/31/2017 [place] 05/31/2017 1407   LABSPEC 1.024 05/31/2017 1407   PHURINE 5.0 05/31/2017 1407   GLUCOSEU NEGATIVE 05/31/2017 1407   HGBUR NEGATIVE 05/31/2017 1407   BILIRUBINUR NEGATIVE 05/31/2017 1407   KETONESUR NEGATIVE 05/31/2017 1407   PROTEINUR 30 (A) 05/31/2017 1407   UROBILINOGEN 0.2 05/26/2008 1446   NITRITE NEGATIVE 05/31/2017 1407   LEUKOCYTESUR NEGATIVE 05/31/2017 1407   Sepsis Labs: $[email or web address]$ (procalcitonin:4,lacticidven:4)  ) No results found for this or any previous visit (from the past 240 hour(s)).    Studies: No results found.  Scheduled Meds: . amLODipine  10 mg Oral Daily  . carvedilol  25 mg Oral BID WC  . cloNIDine  0.3 mg Oral TID  . heparin  5,000 Units Subcutaneous Q8H  . hydrALAZINE  100 mg Oral Q6H  . mouth rinse  15 mL Mouth Rinse BID  . pantoprazole  40 mg Oral BID  . thiamine  100 mg Oral  Daily    Continuous Infusions: . sodium chloride Stopped (05/31/17 1600)  . sodium chloride 50 mL/hr (06/14/17 0831)     LOS: 14 days     Alma Friendly, MD [facility]   If 7PM-7AM, please contact night-coverage [email or web address] Password [email or web address] 06/14/2017, 10:45 AM

## 2017-06-15 LAB — BASIC METABOLIC PANEL
ANION GAP: 8 (ref 5–15)
BUN: 22 mg/dL — ABNORMAL HIGH (ref 6–20)
CALCIUM: 9.1 mg/dL (ref 8.9–10.3)
CO2: 20 mmol/L — ABNORMAL LOW (ref 22–32)
Chloride: 106 mmol/L (ref 101–111)
Creatinine, Ser: 1.58 mg/dL — ABNORMAL HIGH (ref 0.44–1.00)
GFR calc Af Amer: 41 mL/min — ABNORMAL LOW (ref >60)
GFR, EST NON AFRICAN AMERICAN: 36 mL/min — AB (ref >60)
GLUCOSE: 131 mg/dL — AB (ref 65–99)
Potassium: 4.2 mmol/L (ref 3.5–5.1)
Sodium: 134 mmol/L — ABNORMAL LOW (ref 135–145)

## 2017-06-15 MED ORDER — CARVEDILOL 25 MG PO TABS: 25.0000 mg | ORAL_TABLET | Freq: Two times a day (BID) | ORAL | 0 refills | Status: DC

## 2017-06-15 MED ORDER — CLONIDINE HCL 0.3 MG PO TABS: 0.3000 mg | ORAL_TABLET | Freq: Three times a day (TID) | ORAL | 0 refills | Status: DC

## 2017-06-15 MED ORDER — THIAMINE HCL 100 MG PO TABS: 100.0000 mg | ORAL_TABLET | Freq: Every day | ORAL | 0 refills | Status: AC

## 2017-06-15 MED ORDER — AMLODIPINE BESYLATE 10 MG PO TABS: 10.0000 mg | ORAL_TABLET | Freq: Every day | ORAL | 0 refills | Status: DC

## 2017-06-15 MED ORDER — HYDRALAZINE HCL 100 MG PO TABS: 100.0000 mg | ORAL_TABLET | Freq: Four times a day (QID) | ORAL | 0 refills | Status: DC

## 2017-06-15 NOTE — Progress Notes (Signed)
Aris Everts Vandekamp to be D/C'd Home per MD order. Discussed with the patient and all questions fully answered.    IV catheter discontinued intact. Site without signs and symptoms of complications. Dressing and pressure applied.  An After Visit Summary was printed and given to the patient.  Patient escorted via [place], and D/C home via private auto.  [place]yndra Numbers  06/15/2017 12:50 PM

## 2017-06-15 NOTE — Care Management Note (Signed)
Case Management Note Previous CM note completed by Erenest Rasher, RN 06/12/2017, 10:00 AM   Patient Details  Name: CANDA PODGORSKI MRN: [id number] Date of Birth: 1960-07-13  Subjective/Objective:    Descending Thoracic Dissection, Aortic Dissection, HTN crisis, PNA, HF, CAD, AKI              Action/Plan: NCM spoke to pt and she lives alone. No DME needed. Pt states she will need a note for work. Pt will benefit HH RN, Offered choice for HH/list provided. Pt states she does not have a PCP. Pt requested NCM arrange appt. Contacted Dr Roma Kayser office and Dr Gerarda Fraction to arrange appt. Office will review and call pt to arrange a follow up appt if they are able to accept. Updated attending.    Offered choice for HH/list provided. Pt agreeable to [facility] for HH. Updated pt on PCP's and appt.   Expected Discharge Date: 06/15/17                Expected Discharge Plan:  [place]  In-House Referral:  NA  Discharge planning Services  CM Consult, Other - See comment  Post Acute Care Choice:  Home Health Choice offered to:  Patient  DME Arranged:  N/A DME Agency:  NA  HH Arranged:  RN, PT [place] Agency:  [place]  Status of Service:  Completed, signed off  If discussed at [place] of Stay Meetings, dates discussed:    Discharge Disposition: home/home health   Additional Comments:  06/15/17- 1200- Tailer Volkert RN, CM- pt for d/c home today- calls made to f/u on PCP appointments- to both Dr. Mannie Stabile and [facility] to see if F/U appointment with either could be obtained prior to discharge- both practices are reviewing pt for new pt appointment- both informed this CM that once reviewed if pt is accepted office will call pt with appointment- spoke with pt and informed her of this process- pt voices understanding- confirmed with pt about HH of choice- [facility]- call made to [facility] with [facility]- referral has been accepted for both RN/PT services-.   Dawayne Patricia,  RN 06/15/2017, 12:53 PM [phone] 4E Transition Care Coordinator

## 2017-06-15 NOTE — Discharge Summary (Addendum)
Discharge Summary  Dorothy Harris:[id number] DOB: Feb 05, 1961  PCP: Patient, No Pcp Per  Admit date: 05/31/2017 Discharge date: 06/15/2017  Time spent: > 30 mins    Recommendations for Outpatient Follow-up:  1. PCP 2. Vascular surgery   Discharge Diagnoses:  Active Hospital Problems   Diagnosis Date Noted  . 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/2017  . Influenza, pneumonia   . Hypertensive crisis 05/31/2017  . 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  . Chest pain 05/31/2017  . Aortic dissection, thoracic (HCC) 05/31/2017  . Coronary artery disease due to lipid rich plaque   . Acute diastolic heart failure [facility])     Resolved Hospital Problems  No resolved problems to display.    Discharge Condition: Stable  Diet recommendation: Heart healthy  Vitals:   06/15/17 0003 06/15/17 0310  BP: (!) 142/82 (!) 142/86  Pulse: 60 (!) 59  Resp: 20 20  Temp: 98.6 F (37 C) 99 F (37.2 C)  SpO2: 98% 99%    History of present illness:  57 yo female with hx HTN, cocaine abuse presented 2/24 with chest pain, AMS, HTN crisis. ER w/u included EKG without ischemic changes, Flu POS, CTA chest which revealed type B dissection. UDS neg for cocaine. Pt was started on cardene gtt for aggressive BP management  Today, pt denies any new symptoms, eager to be discharged. Cr is coming down nicely. Denies chest pain, SOB, headache, abdominal pain, N/V/D/C. Pt advised to be compliant with her BP meds, PCP appointments and lifestyle modification.  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 Stable, no chest  pain Vascular surgery on board: No plans to repeat CTA while inpt due to worsening AKI, defer until outpt follow up CVTS recommended BP control  Continue amlodipine, coreg, clonidine, hydralazine Lisinopril and chlorthalidone discontinued due to worsening AKI Pt advised to be compliant with her BP meds, PCP appointments and lifestyle modification Follow up scheduled with vascular surgery  Hypertensive crisis Better controlled, aim for SBP of <130 Continue amlodipine, coreg, clonidine, hydralazine UDS negative PCP to follow up closely, [facility] RN arranged for closer observation  Troponin elevated Denies chest pain Demand ischemia, in setting HTN urgency Cardiology on board, no further recs  CAP, influenza A   Completed 5 days of tamiflu, 7 days of Azithromycin + ceftriaxone   AKI Cr 2.09 on admission-->1.58 Likely med induced  Stopped lisinopril and chlorthalidone Renal artery duplex WNL PCP to follow up closely, pt advised to increase oral intake  Acute metabolic encephalopathy Resolved Likely due to malignant HTN  ??Acute diastolic HF exacerbation Stable, likely due to hypertensive crisis BNP 3,000, received lasix prn ECHO: EF 55-60%, couldn't evaluate the diastolic fxn Cardiology on board, no further recommendation PCP to follow up closley    Procedures:  None  Consultations:  CVTS  Cardiology  Vascular surgery   Discharge Exam: BP (!) 142/86 (BP Location: Right Arm)   Pulse (!) 59   Temp 99 F (37.2 C) (Oral)   Resp 20   Ht 5\' 7"  (1.702 m)   Wt 80.5 kg (177 lb 6.4 oz)   SpO2 99%   BMI 27.78  kg/m   General: NAD Cardiovascular: S1, S2 present Respiratory: CTA  Discharge Instructions You were cared for by a hospitalist during your hospital stay. If you have any questions about your discharge medications or the care you received while you were in the hospital after you are discharged, you can call the unit and asked to speak with the hospitalist on  call if the hospitalist that took care of you is not available. Once you are discharged, your primary care physician will handle any further medical issues. Please note that NO REFILLS for any discharge medications will be authorized once you are discharged, as it is imperative that you return to your primary care physician (or establish a relationship with a primary care physician if you do not have one) for your aftercare needs so that they can reassess your need for medications and monitor your lab values.   Allergies as of 06/15/2017   No Known Allergies     Medication List    STOP taking these medications   lisinopril 20 MG tablet Commonly known as:  PRINIVIL,ZESTRIL   metoprolol tartrate 25 MG tablet Commonly known as:  LOPRESSOR     TAKE these medications   acetaminophen 500 MG tablet Commonly known as:  TYLENOL Take 500 mg by mouth every 6 (six) hours as needed for mild pain or moderate pain.   amLODipine 10 MG tablet Commonly known as:  NORVASC Take 1 tablet (10 mg total) by mouth daily. Start taking on:  06/16/2017 What changed:    medication strength  how much to take   aspirin 81 MG EC tablet Take 1 tablet (81 mg total) by mouth daily.   atorvastatin 20 MG tablet Commonly known as:  LIPITOR Take 1 tablet (20 mg total) by mouth daily at 6 PM.   carvedilol 25 MG tablet Commonly known as:  COREG Take 1 tablet (25 mg total) by mouth 2 (two) times daily with a meal.   cloNIDine 0.3 MG tablet Commonly known as:  CATAPRES Take 1 tablet (0.3 mg total) by mouth 3 (three) times daily.   hydrALAZINE 100 MG tablet Commonly known as:  APRESOLINE Take 1 tablet (100 mg total) by mouth every 6 (six) hours.   nitroGLYCERIN 0.4 MG SL tablet Commonly known as:  NITROSTAT Place 1 tablet (0.4 mg total) under the tongue every 5 (five) minutes as needed for chest pain.   pantoprazole 40 MG tablet Commonly known as:  PROTONIX Take 1 tablet (40 mg total) by mouth daily at 6  (six) AM.   thiamine 100 MG tablet Take 1 tablet (100 mg total) by mouth daily. Start taking on:  06/16/2017      No Known Allergies Follow-up Information    Caren Macadam, MD Follow up.   Specialty:  Family Medicine Why:  office reviewing for new pt- to call once cleared to make an appointment with pt  Contact information: [address] [place] [address] [phone]        Redmond School, MD Follow up.   Specialty:  Internal Medicine Why:  office is reviewing for new pt appointment- will call pt if approved by office with appointment Contact information: [address] [place] [address] [phone]        [facility] Follow up.   Specialty:  [place] Why:  HHRN/PT arranged- they will call you to set up home visits Contact information: [address] [place] [address] [phone]  The results of significant diagnostics from this hospitalization (including imaging, microbiology, ancillary and laboratory) are listed below for reference.    Significant Diagnostic Studies: Ct Angio Head W Or Wo Contrast  Result Date: 05/31/2017 CLINICAL DATA:  Known acute aortic dissection. Mental status change. EXAM: CT ANGIOGRAPHY HEAD AND NECK TECHNIQUE: Multidetector CT imaging of the head and neck was performed using the standard protocol during bolus administration of intravenous contrast. Multiplanar CT image reconstructions and MIPs were obtained to evaluate the vascular anatomy. Carotid stenosis measurements (when applicable) are obtained utilizing NASCET criteria, using the distal internal carotid diameter as the denominator. CONTRAST:  86mL ISOVUE-370 IOPAMIDOL (ISOVUE-370) INJECTION 76% COMPARISON:  CTA of the chest, abdomen, and pelvis from the same day at 8:38 a.m. FINDINGS: CT HEAD FINDINGS Brain: No acute infarct, hemorrhage, No acute infarct, hemorrhage, or mass lesion is present. The  ventricles are of normal size. No significant extraaxial fluid collection is present. No significant white matter disease is present. The brainstem and cerebellum are normal. Vascular: No hyperdense vessel or unexpected calcification. Skull: The calvarium is intact. No focal lytic or blastic lesions are present. Sinuses: The paranasal sinuses and mastoid air cells are clear. There is extensive pneumatization of the mastoids bilaterally. Orbits: Globes and orbits are within normal limits. Review of the MIP images confirms the above findings CTA NECK FINDINGS Aortic arch: A 3 vessel arch configuration is present. 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 There is no involvement of the great vessel origins. Right carotid system: The right common carotid artery is mildly tortuous without focal stenosis. Bifurcation is unremarkable. There is mild tortuosity of the cervical right ICA without stenosis. Left carotid system: The left common carotid artery demonstrates moderate tortuosity proximally without significant stenosis. Bifurcation is unremarkable. Cervical left ICA is mildly tortuous. There is no significant stenosis. Vertebral arteries: The vertebral arteries originate from the subclavian arteries bilaterally without significant stenosis. The vertebral arteries are codominant. There is no focal stenosis or vascular injury to either vertebral artery in the neck. Skeleton: Vertebral body heights alignment are maintained. No focal lytic or blastic lesions are present. Facet degenerative changes are most evident at C3-4 and C7-T1 on the left. There is congenital fusion at C2-3. Other neck: A the soft tissues the neck are otherwise unremarkable. No focal mucosal or submucosal lesions are present. Vocal cords are midline and symmetric. Upper chest: Airspace consolidation is present in the left lower lobe. There is patchy airspace disease throughout  the left upper lobe. Mild atelectasis is present on the right. Review of the MIP images confirms the above findings CTA HEAD FINDINGS Anterior circulation: Mild narrowing of the cavernous right internal carotid artery is less than 50%. The right A1 segment is aplastic. Both A2 segments fill from the left. The right M1 segment demonstrates mild irregularity distally without a significant stenosis. The MCA bifurcations are intact. Medium and distal small vessel irregularity is present throughout the ACA and MCA branches without a significant proximal stenosis or occlusion. Posterior circulation: The vertebral arteries are codominant. The basilar artery is normal. Both posterior cerebral arteries originate from the basilar tip. There is some attenuation of distal PCA branch vessels without a significant proximal stenosis or occlusion. Venous sinuses: The dural sinuses are patent. The straight sinus and deep cerebral veins are intact. Cortical veins are unremarkable. Anatomic variants: None Delayed phase: Unremarkable Review of the MIP images confirms the above findings IMPRESSION: 1. Stanford  type B aortic dissection begins just be on the left subclavian artery origin without involvement of the great vessels. 2. Tortuosity of the carotid arteries bilaterally without focal stenosis. This likely reflects the sequelae of chronic hypertension. 3. Medium and distal small vessel atherosclerotic irregularity throughout the circle-of-Willis without a significant proximal stenosis, aneurysm, or branch vessel occlusion. 4. No acute intracranial abnormality. 5. 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 Electronically Signed   By: San Morelle M.D.   On: 05/31/2017 18:20   Dg Chest 1 View  Result Date: 06/01/2017 CLINICAL DATA:  CHF. EXAM: CHEST 1 VIEW COMPARISON:  Chest x-ray dated May 31, 2017. FINDINGS: Stable cardiomegaly. Normal pulmonary vascularity. Unchanged patchy opacities in the lingula. No  pleural effusion or the mid pneumothorax. No acute osseous abnormality. IMPRESSION: Unchanged lingular airspace disease. Electronically Signed   By: Titus Dubin M.D.   On: 06/01/2017 11:29   Dg Chest 2 View  Result Date: 06/06/2017 CLINICAL DATA:  Initial evaluation for abnormal chest radiograph. EXAM: CHEST  2 VIEW COMPARISON:  Prior radiograph from 06/03/2017. FINDINGS: Stable cardiomegaly.  Mediastinal silhouette is unchanged. Lungs normally inflated. Small layering bilateral pleural effusions. Mild perihilar vascular congestion without pulmonary edema. Patchy and linear opacities at the left lung base, progressed from previous, which may reflect atelectasis and/or consolidation. No other focal airspace disease. No pneumothorax. Osseous structures unchanged. IMPRESSION: 1. Patchy left lower lobe opacity, slightly worsened from previous, which may reflect progressive atelectasis and/or pneumonia. 2. Small layering bilateral pleural effusions. 3. Stable cardiomegaly without pulmonary edema. Electronically Signed   By: Jeannine Boga M.D.   On: 06/06/2017 13:43   Dg Abd 1 View  Result Date: 06/03/2017 CLINICAL DATA:  Abdominal pain. Known midline abdominal hernia containing transverse colon. EXAM: ABDOMEN - 1 VIEW COMPARISON:  CT scan of the abdomen dated 05/31/2017 FINDINGS: Bowel gas pattern is normal. No dilated bowel. No visible free air. No acute bone abnormality. IMPRESSION: No appreciable acute abnormality of the abdomen. Electronically Signed   By: Lorriane Shire M.D.   On: 06/03/2017 16:38   Ct Angio Neck W Or Wo Contrast  Result Date: 05/31/2017 CLINICAL DATA:  Known acute aortic dissection. Mental status change. EXAM: CT ANGIOGRAPHY HEAD AND NECK TECHNIQUE: Multidetector CT imaging of the head and neck was performed using the standard protocol during bolus administration of intravenous contrast. Multiplanar CT image reconstructions and MIPs were obtained to evaluate the vascular  anatomy. Carotid stenosis measurements (when applicable) are obtained utilizing NASCET criteria, using the distal internal carotid diameter as the denominator. CONTRAST:  56mL ISOVUE-370 IOPAMIDOL (ISOVUE-370) INJECTION 76% COMPARISON:  CTA of the chest, abdomen, and pelvis from the same day at 8:38 a.m. FINDINGS: CT HEAD FINDINGS Brain: No acute infarct, hemorrhage, No acute infarct, hemorrhage, or mass lesion is present. The ventricles are of normal size. No significant extraaxial fluid collection is present. No significant white matter disease is present. The brainstem and cerebellum are normal. Vascular: No hyperdense vessel or unexpected calcification. Skull: The calvarium is intact. No focal lytic or blastic lesions are present. Sinuses: The paranasal sinuses and mastoid air cells are clear. There is extensive pneumatization of the mastoids bilaterally. Orbits: Globes and orbits are within normal limits. Review of the MIP images confirms the above findings CTA NECK FINDINGS Aortic arch: A 3 vessel arch configuration is present. 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 There is no involvement of the great vessel origins. Right  carotid system: The right common carotid artery is mildly tortuous without focal stenosis. Bifurcation is unremarkable. There is mild tortuosity of the cervical right ICA without stenosis. Left carotid system: The left common carotid artery demonstrates moderate tortuosity proximally without significant stenosis. Bifurcation is unremarkable. Cervical left ICA is mildly tortuous. There is no significant stenosis. Vertebral arteries: The vertebral arteries originate from the subclavian arteries bilaterally without significant stenosis. The vertebral arteries are codominant. There is no focal stenosis or vascular injury to either vertebral artery in the neck. Skeleton: Vertebral body heights alignment are maintained. No  focal lytic or blastic lesions are present. Facet degenerative changes are most evident at C3-4 and C7-T1 on the left. There is congenital fusion at C2-3. Other neck: A the soft tissues the neck are otherwise unremarkable. No focal mucosal or submucosal lesions are present. Vocal cords are midline and symmetric. Upper chest: Airspace consolidation is present in the left lower lobe. There is patchy airspace disease throughout the left upper lobe. Mild atelectasis is present on the right. Review of the MIP images confirms the above findings CTA HEAD FINDINGS Anterior circulation: Mild narrowing of the cavernous right internal carotid artery is less than 50%. The right A1 segment is aplastic. Both A2 segments fill from the left. The right M1 segment demonstrates mild irregularity distally without a significant stenosis. The MCA bifurcations are intact. Medium and distal small vessel irregularity is present throughout the ACA and MCA branches without a significant proximal stenosis or occlusion. Posterior circulation: The vertebral arteries are codominant. The basilar artery is normal. Both posterior cerebral arteries originate from the basilar tip. There is some attenuation of distal PCA branch vessels without a significant proximal stenosis or occlusion. Venous sinuses: The dural sinuses are patent. The straight sinus and deep cerebral veins are intact. Cortical veins are unremarkable. Anatomic variants: None Delayed phase: Unremarkable Review of the MIP images confirms the above findings IMPRESSION: 1. 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 2. Tortuosity of the carotid arteries bilaterally without focal stenosis. This likely reflects the sequelae of chronic hypertension. 3. Medium and distal small vessel atherosclerotic irregularity throughout the circle-of-Willis without a significant proximal stenosis, aneurysm, or branch  vessel occlusion. 4. No acute intracranial abnormality. 5. 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 Electronically Signed   By: San Morelle M.D.   On: 05/31/2017 18:20   Dg Chest Port 1 View  Result Date: 06/03/2017 CLINICAL DATA:  Type B aortic dissection.  Shortness of breath. EXAM: PORTABLE CHEST 1 VIEW COMPARISON:  One-view chest x-ray 06/03/2017 FINDINGS: Heart is enlarged. Mild pulmonary vascular congestion is present. Aeration of the left lung is improving. Right lung is clear. Lung volumes are low. IMPRESSION: 1. Improving aeration of the left lung with some residual lower lobe and lingular airspace disease. 2. Stable cardiomegaly and mild pulmonary vascular congestion. Electronically Signed   By: San Morelle M.D.   On: 06/03/2017 16:36   Dg Chest Port 1 View  Result Date: 06/03/2017 CLINICAL DATA:  57 year old female with cough. Stanford type B thoracic aortic dissection. EXAM: PORTABLE CHEST 1 VIEW COMPARISON:  06/01/2017, chest CTA 05/31/2017, and earlier. FINDINGS: Portable AP semi upright view at 0556 hours. Larger lung volumes. Decreased pulmonary vascular congestion. Continued confluent opacity in the left mid and lower lung. Left lung base ventilation has mildly improved since 05/31/2017 with improved visualization of the left hemidiaphragm. The right lung remains clear. Visualized tracheal  air column is within normal limits. Stable cardiac size and mediastinal contours. No pneumothorax. No definite pleural effusion. IMPRESSION: 1. Continued left lung pneumonia. Mildly improved left lung base ventilation since 05/31/2017. No pleural effusion is evident. 2. Resolved pulmonary vascular congestion since yesterday. No new cardiopulmonary abnormality. Electronically Signed   By: Odessa Fleming M.D.   On: 06/03/2017 10:11   Dg Chest Port 1 View  Result Date: 05/31/2017 CLINICAL DATA:  Chest pain.  High blood pressure. EXAM: PORTABLE CHEST 1 VIEW COMPARISON:  December 22, 2015 FINDINGS: The study is limited due to the portable technique with poor penetration. No pneumothorax identified. Opacity in the left retrocardiac region cannot be excluded on this study. Mild haziness over the lateral left lung is probably due to overlapping soft tissues. Lucency along the aortic arch is favored to be artifactual rather than mediastinal air. No other evidence of mediastinal air. IMPRESSION: 1. Limited evaluation due to portable technique. 2. Lucency along the aortic arch is favored to be artifactual rather than mediastinal air. Recommend a PA and lateral chest x-ray for better evaluation. 3. Left retrocardiac opacity cannot be excluded on this study due to technique. This could be better evaluated with the recommended PA and lateral chest x-ray. Electronically Signed   By: Gerome Sam III M.D   On: 05/31/2017 07:44   Ct Angio Chest/abd/pel For Dissection W And/or Wo Contrast  Result Date: 05/31/2017 CLINICAL DATA:  Intermittent chest pain for 2 weeks EXAM: CT ANGIOGRAPHY CHEST, ABDOMEN AND PELVIS TECHNIQUE: Multidetector CT imaging through the chest, abdomen and pelvis was performed using the standard protocol during bolus administration of intravenous contrast. Multiplanar reconstructed images and MIPs were obtained and reviewed to evaluate the vascular anatomy. CONTRAST:  ISOVUE-370 IOPAMIDOL (ISOVUE-370) INJECTION 76% COMPARISON:  None. FINDINGS: CTA CHEST FINDINGS Cardiovascular: A dissection involving the aortic arch and descending thoracic aorta is present consistent with a Stanford type B dissection. The dissection flap begins just beyond the origin of the left subclavian artery and extends to the mid descending thoracic aorta. The true lumen is the dominant component. The false lumen is somewhat spiral in configuration and primarily enhances. A tiny portion of the false lumen is thrombosed inferiorly. See image 40 of series 5. Great vessels are patent. Specifically, there  is no evidence of extension of the dissection flap into the great vessels. Vertebral arteries are also patent. No obvious acute pulmonary thromboembolism. Coronary arteries are grossly unremarkable. Mediastinum/Nodes: Small scattered mediastinal nodes. Unremarkable thyroid and esophagus. Physiologic pericardial fluid. Lungs/Pleura: No pneumothorax.  No pleural effusion. There is patchy airspace disease in the lingula. Musculoskeletal: There is no vertebral compression deformity. Review of the MIP images confirms the above findings. CTA ABDOMEN AND PELVIS FINDINGS VASCULAR Aorta: Mild atherosclerotic calcification. Ectasia of the distal abdominal aorta measures up to 3.5 cm in diameter. No evidence of dissection in the abdominal aorta. Celiac: Patent.  Branch vessels patent. SMA: Patent. Renals: Single renal arteries are patent. IMA: Significant narrowing at the origin is suspected. Branch vessels patent. Inflow: There are atherosclerotic calcifications involving the common iliac arteries. No significant narrowing. No evidence of aneurysm. No evidence of dissection. Internal and external iliac arteries are patent. Review of the MIP images confirms the above findings. NON-VASCULAR Hepatobiliary: Diffuse hepatic steatosis. Normal gallbladder. There is a benign-appearing cyst in the caudate lobe of the liver. Pancreas: Unremarkable Spleen: Unremarkable Adrenals/Urinary Tract: Unremarkable appearance of the kidneys. Right adrenal gland is unremarkable. 1.2 cm left adrenal nodule is nonspecific by  imaging characteristics. Bladder is within normal limits. Stomach/Bowel: Stomach is unremarkable and decompressed. Duodenum is within normal limits. No evidence of small-bowel obstruction. A portion of the transverse colon traverses into a ventral abdominal hernia. No evidence of colon obstruction. No obvious mass in the colon. Lymphatic: There is no evidence of abnormal retroperitoneal adenopathy. Reproductive: Uterus and  adnexa are within normal limits. Other: There is no free fluid. A ventral abdominal hernia contains adipose tissue and transverse colon without evidence of obstruction. Musculoskeletal: No vertebral compression deformity. There is degenerative disc disease at L4-5 and L5-S1. Lumbar facet arthropathy is most prominent at L3-4 and L4-5. Review of the MIP images confirms the above findings. 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 The dissection flap begins just beyond the origin of the left subclavian artery and extends to the mid descending thoracic aorta. The true lumen is dominant. A tiny portion of the false lumen is thrombosed. It largely opacifies with contrast. There is no evidence of extension into branch vessels. There is no evidence of extension below the diaphragm. Critical Value/emergent results were called by telephone at the time of interpretation on 05/31/2017 at 9:30 am to Dr. Milton Ferguson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

## 2017-06-15 NOTE — Progress Notes (Signed)
  Progress Note    06/15/2017 10:00 AM * No surgery found *  Subjective:  No complaints  Vitals:   06/15/17 0003 06/15/17 0310  BP: (!) 142/82 (!) 142/86  Pulse: 60 (!) 59  Resp: 20 20  Temp: 98.6 F (37 C) 99 F (37.2 C)  SpO2: 98% 99%    Physical Exam: aaox3 Non labored respirations Abdomen is soft  CBC    Component Value Date/Time   WBC 5.5 06/07/2017 0406   RBC 4.08 06/07/2017 0406   HGB 12.9 06/07/2017 0406   HCT 38.8 06/07/2017 0406   PLT 315 06/07/2017 0406   MCV 95.1 06/07/2017 0406   MCH 31.6 06/07/2017 0406   MCHC 33.2 06/07/2017 0406   RDW 15.5 06/07/2017 0406   LYMPHSABS 0.1 (L) 05/31/2017 1252   MONOABS 0.9 05/31/2017 1252   EOSABS 0.0 05/31/2017 1252   BASOSABS 0.0 05/31/2017 1252    BMET    Component Value Date/Time   NA 134 (L) 06/15/2017 0306   K 4.2 06/15/2017 0306   CL 106 06/15/2017 0306   CO2 20 (L) 06/15/2017 0306   GLUCOSE 131 (H) 06/15/2017 0306   BUN 22 (H) 06/15/2017 0306   CREATININE 1.58 (H) 06/15/2017 0306   CALCIUM 9.1 06/15/2017 0306   GFRNONAA 36 (L) 06/15/2017 0306   GFRAA 41 (L) 06/15/2017 0306    INR    Component Value Date/Time   INR 1.04 12/23/2015 1040     Intake/Output Summary (Last 24 hours) at 06/15/2017 1000 Last data filed at 06/15/2017 0600 Gross per 24 hour  Intake 2245.83 ml  Output 1250 ml  Net 995.83 ml     Assessment:  57 y.o. female is here with tbad and uncontrolled hypertension now managed with po meds.  Plan: F/u 3.29 with repeat CTA I have counseled her on importance of taking her bp meds to avoid another aortic emergency or expansion of her aorta.   Kayline Sheer C. Donzetta Matters, MD [facility] Office: [phone] Pager: [phone]  06/15/2017 10:00 AM
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
## 2017-06-19 ENCOUNTER — Telehealth: Payer: Self-pay | Admitting: Vascular Surgery

## 2017-06-19 NOTE — Telephone Encounter (Signed)
Sched CTA 07/01/17 at [facility] at 3:00. Sched MD appt 07/03/17 at 8:30. Lm on cell# to inform pt of appts and cta instructions.

## 2017-06-19 NOTE — Telephone Encounter (Signed)
-----   Message from Mena Goes, RN sent at 06/09/2017 11:38 AM EST ----- Regarding: 2-4 weeks appt w/ CTA chest, abd, pelvis   ----- Message ----- From: Waynetta Sandy, MD Sent: 06/09/2017  11:17 AM To: [address]  MALEIYAH RELEFORD [phone] [id number]  She needs f/u in 2-4 weeks with CT angio chest, abdomen and pelvis dissection protocol

## 2017-07-01 ENCOUNTER — Other Ambulatory Visit: Payer: Self-pay | Admitting: Vascular Surgery

## 2017-07-01 ENCOUNTER — Ambulatory Visit (HOSPITAL_COMMUNITY)
Admission: RE | Admit: 2017-07-01 | Discharge: 2017-07-01 | Disposition: A | Discharge status: Home / Self Care | Payer: BLUE CROSS/BLUE SHIELD | Source: Ambulatory Visit | Attending: Vascular Surgery | Admitting: Vascular Surgery

## 2017-07-01 DIAGNOSIS — K429 Umbilical hernia without obstruction or gangrene: Secondary | ICD-10-CM | POA: Diagnosis not present

## 2017-07-01 DIAGNOSIS — I714 Abdominal aortic aneurysm, without rupture: Secondary | ICD-10-CM | POA: Diagnosis not present

## 2017-07-01 DIAGNOSIS — I7101 Dissection of thoracic aorta: Secondary | ICD-10-CM

## 2017-07-01 DIAGNOSIS — I700 Atherosclerosis of aorta: Secondary | ICD-10-CM | POA: Insufficient documentation

## 2017-07-01 DIAGNOSIS — I71019 Dissection of thoracic aorta, unspecified: Secondary | ICD-10-CM

## 2017-07-01 DIAGNOSIS — E278 Other specified disorders of adrenal gland: Secondary | ICD-10-CM | POA: Insufficient documentation

## 2017-07-01 MED ORDER — IOPAMIDOL (ISOVUE-370) INJECTION 76%
100.0000 mL | Freq: Once | INTRAVENOUS | Status: AC | PRN
  Administered 2017-07-01: 80 mL via INTRAVENOUS

## 2017-07-03 ENCOUNTER — Encounter: Payer: Self-pay | Admitting: Vascular Surgery

## 2017-07-03 ENCOUNTER — Other Ambulatory Visit: Payer: Self-pay

## 2017-07-03 ENCOUNTER — Ambulatory Visit (INDEPENDENT_AMBULATORY_CARE_PROVIDER_SITE_OTHER): Payer: BLUE CROSS/BLUE SHIELD | Admitting: Vascular Surgery

## 2017-07-03 VITALS — BP 109/81 | HR 62 | Temp 97.3°F | Resp 16 | Ht 66.0 in | Wt 173.0 lb

## 2017-07-03 DIAGNOSIS — I7101 Dissection of thoracic aorta: Secondary | ICD-10-CM

## 2017-07-03 DIAGNOSIS — I71019 Dissection of thoracic aorta, unspecified: Secondary | ICD-10-CM
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
## 2017-07-30 ENCOUNTER — Other Ambulatory Visit: Payer: Self-pay | Admitting: Family Medicine

## 2017-07-30 ENCOUNTER — Encounter: Payer: Self-pay | Admitting: Family Medicine

## 2017-07-30 ENCOUNTER — Ambulatory Visit: Payer: BLUE CROSS/BLUE SHIELD | Admitting: Family Medicine

## 2017-07-30 ENCOUNTER — Other Ambulatory Visit: Payer: Self-pay

## 2017-07-30 VITALS — BP 122/88 | HR 75 | Temp 98.0°F | Resp 16 | Ht 67.5 in | Wt 176.2 lb

## 2017-07-30 DIAGNOSIS — N289 Disorder of kidney and ureter, unspecified: Secondary | ICD-10-CM

## 2017-07-30 DIAGNOSIS — I10 Essential (primary) hypertension: Secondary | ICD-10-CM

## 2017-07-30 DIAGNOSIS — Z1231 Encounter for screening mammogram for malignant neoplasm of breast: Secondary | ICD-10-CM

## 2017-07-30 DIAGNOSIS — Z87898 Personal history of other specified conditions: Secondary | ICD-10-CM

## 2017-07-30 DIAGNOSIS — Z1211 Encounter for screening for malignant neoplasm of colon: Secondary | ICD-10-CM

## 2017-07-30 DIAGNOSIS — Z79899 Other long term (current) drug therapy: Secondary | ICD-10-CM | POA: Diagnosis not present

## 2017-07-30 DIAGNOSIS — R7303 Prediabetes: Secondary | ICD-10-CM

## 2017-07-30 DIAGNOSIS — I71019 Dissection of thoracic aorta, unspecified: Secondary | ICD-10-CM

## 2017-07-30 DIAGNOSIS — Z23 Encounter for immunization: Secondary | ICD-10-CM

## 2017-07-30 DIAGNOSIS — Z720 Tobacco use: Secondary | ICD-10-CM

## 2017-07-30 DIAGNOSIS — Z1239 Encounter for other screening for malignant neoplasm of breast: Secondary | ICD-10-CM

## 2017-07-30 DIAGNOSIS — Z9189 Other specified personal risk factors, not elsewhere classified: Secondary | ICD-10-CM

## 2017-07-30 DIAGNOSIS — Z113 Encounter for screening for infections with a predominantly sexual mode of transmission: Secondary | ICD-10-CM

## 2017-07-30 DIAGNOSIS — F1991 Other psychoactive substance use, unspecified, in remission: Secondary | ICD-10-CM

## 2017-07-30 DIAGNOSIS — Z1159 Encounter for screening for other viral diseases: Secondary | ICD-10-CM

## 2017-07-30 DIAGNOSIS — I7101 Dissection of thoracic aorta: Secondary | ICD-10-CM

## 2017-07-30 LAB — HEPATIC FUNCTION PANEL
AG Ratio: 1.5 (calc) (ref 1.0–2.5)
ALBUMIN MSPROF: 4.6 g/dL (ref 3.6–5.1)
ALKALINE PHOSPHATASE (APISO): 77 U/L (ref 33–130)
ALT: 13 U/L (ref 6–29)
AST: 24 U/L (ref 10–35)
Bilirubin, Direct: 0.2 mg/dL (ref 0.0–0.2)
Globulin: 3 g/dL (calc) (ref 1.9–3.7)
Indirect Bilirubin: 0.6 mg/dL (calc) (ref 0.2–1.2)
TOTAL PROTEIN: 7.6 g/dL (ref 6.1–8.1)
Total Bilirubin: 0.8 mg/dL (ref 0.2–1.2)

## 2017-07-30 LAB — BASIC METABOLIC PANEL
BUN / CREAT RATIO: 17 (calc) (ref 6–22)
BUN: 41 mg/dL — AB (ref 7–25)
CO2: 24 mmol/L (ref 20–32)
CREATININE: 2.39 mg/dL — AB (ref 0.50–1.05)
Calcium: 9.8 mg/dL (ref 8.6–10.4)
Chloride: 105 mmol/L (ref 98–110)
GLUCOSE: 88 mg/dL (ref 65–99)
Potassium: 4.6 mmol/L (ref 3.5–5.3)
Sodium: 137 mmol/L (ref 135–146)

## 2017-07-30 MED ORDER — CARVEDILOL 25 MG PO TABS: 25.0000 mg | ORAL_TABLET | Freq: Two times a day (BID) | ORAL | 0 refills | Status: DC

## 2017-07-30 MED ORDER — HYDRALAZINE HCL 100 MG PO TABS: 100.0000 mg | ORAL_TABLET | Freq: Four times a day (QID) | ORAL | 0 refills | Status: DC

## 2017-07-30 MED ORDER — CLONIDINE HCL 0.3 MG PO TABS: 0.3000 mg | ORAL_TABLET | Freq: Three times a day (TID) | ORAL | 0 refills | Status: DC

## 2017-07-30 MED ORDER — AMLODIPINE BESYLATE 10 MG PO TABS: 10.0000 mg | ORAL_TABLET | Freq: Every day | ORAL | 0 refills | Status: DC

## 2017-07-30 NOTE — Patient Instructions (Addendum)
Schedule mammogram at check-out  Follow up in 3 weeks. Bring all medicine bottles to visit. Schedule for Friday visit  Get labs done today.   You need to do the cologuard stool test Steps to Quit Smoking Smoking tobacco can be bad for your health. It can also affect almost every organ in your body. Smoking puts you and people around you at risk for many serious long-lasting (chronic) diseases. Quitting smoking is hard, but it is one of the best things that you can do for your health. It is never too late to quit. What are the benefits of quitting smoking? When you quit smoking, you lower your risk for getting serious diseases and conditions. They can include:  Lung cancer or lung disease.  Heart disease.  Stroke.  Heart attack.  Not being able to have children (infertility).  Weak bones (osteoporosis) and broken bones (fractures).  If you have coughing, wheezing, and shortness of breath, those symptoms may get better when you quit. You may also get sick less often. If you are pregnant, quitting smoking can help to lower your chances of having a baby of low birth weight. What can I do to help me quit smoking? Talk with your doctor about what can help you quit smoking. Some things you can do (strategies) include:  Quitting smoking totally, instead of slowly cutting back how much you smoke over a period of time.  Going to in-person counseling. You are more likely to quit if you go to many counseling sessions.  Using resources and support systems, such as: ? Database administrator with a Social worker. ? Phone quitlines. ? Careers information officer. ? Support groups or group counseling. ? Text messaging programs. ? Mobile phone apps or applications.  Taking medicines. Some of these medicines may have nicotine in them. If you are pregnant or breastfeeding, do not take any medicines to quit smoking unless your doctor says it is okay. Talk with your doctor about counseling or other things that  can help you.  Talk with your doctor about using more than one strategy at the same time, such as taking medicines while you are also going to in-person counseling. This can help make quitting easier. What things can I do to make it easier to quit? Quitting smoking might feel very hard at first, but there is a lot that you can do to make it easier. Take these steps:  Talk to your family and friends. Ask them to support and encourage you.  Call phone quitlines, reach out to support groups, or work with a Social worker.  Ask people who smoke to not smoke around you.  Avoid places that make you want (trigger) to smoke, such as: ? Bars. ? Parties. ? Smoke-break areas at work.  Spend time with people who do not smoke.  Lower the stress in your life. Stress can make you want to smoke. Try these things to help your stress: ? Getting regular exercise. ? Deep-breathing exercises. ? Yoga. ? Meditating. ? Doing a body scan. To do this, close your eyes, focus on one area of your body at a time from head to toe, and notice which parts of your body are tense. Try to relax the muscles in those areas.  Download or buy apps on your mobile phone or tablet that can help you stick to your quit plan. There are many free apps, such as QuitGuide from the [employer] Office manager for Disease Control and Prevention). You can find more support from smokefree.gov and other  websites.  This information is not intended to replace advice given to you by your health care provider. Make sure you discuss any questions you have with your health care provider. Document Released: 01/18/2009 Document Revised: 11/20/2015 Document Reviewed: 08/08/2014 Elsevier Interactive Patient Education  2018 [employer].  Tobacco Use Disorder Tobacco use disorder (TUD) is a mental disorder. It is the long-term use of tobacco in spite of related health problems or difficulty with normal life activities. Tobacco is most commonly smoked as cigarettes  and less commonly as cigars or pipes. Smokeless chewing tobacco and snuff are also popular. People with TUD get a feeling of extreme pleasure (euphoria) from using tobacco and have a desire to use it again and again. Repeated use of tobacco can cause problems. The addictive effects of tobacco are due mainly tothe ingredient nicotine. Nicotine also causes a rush of adrenaline (epinephrine) in the body. This leads to increased blood pressure, heart rate, and breathing rate. These changes may cause problems for people with high blood pressure, weak hearts, or lung disease. High doses of nicotine in children and pets can lead to seizures and death. Tobacco contains a number of other unsafe chemicals. These chemicals are especially harmful when inhaled as smoke and can damage almost every organ in the body. Smokers live shorter lives than nonsmokers and are at risk of dying from a number of diseases and cancers. Tobacco smoke can also cause health problems for nonsmokers (due to inhaling secondhand smoke). Smoking is also a fire hazard. TUD usually starts in the late teenage years and is most common in young adults between the ages of 35 and 69 years. People who start smoking earlier in life are more likely to continue smoking as adults. TUD is somewhat more common in men than women. People with TUD are at higher risk for using alcohol and other drugs of abuse. What increases the risk? Risk factors for TUD include:  Having family members with the disorder.  Being around people who use tobacco.  Having an existing mental health issue such as schizophrenia, depression, bipolar disorder, ADHD, or posttraumatic stress disorder (PTSD).  What are the signs or symptoms? People with tobacco use disorder have two or more of the following signs and symptoms within 12 months:  Use of more tobacco over a longer period than intended.  Not able to cut down or control tobacco use.  A lot of time spent obtaining or  using tobacco.  Strong desire or urge to use tobacco (craving). Cravings may last for 6 months or longer after quitting.  Use of tobacco even when use leads to major problems at work, school, or home.  Use of tobacco even when use leads to relationship problems.  Giving up or cutting down on important life activities because of tobacco use.  Repeatedly using tobacco in situations where it puts you or others in physical danger, like smoking in bed.  Use of tobacco even when it is known that a physical or mental problem is likely related to tobacco use. ? Physical problems are numerous and may include chronic bronchitis, emphysema, lung and other cancers, gum disease, high blood pressure, heart disease, and stroke. ? Mental problems caused by tobacco may include difficulty sleeping and anxiety.  Need to use greater amounts of tobacco to get the same effect. This means you have developed a tolerance.  Withdrawal symptoms as a result of stopping or rapidly cutting back use. These symptoms may last a month or more after quitting  and include the following: ? Depressed, anxious, or irritable mood. ? Difficulty concentrating. ? Increased appetite. ? Restlessness or trouble sleeping. ? Use of tobacco to avoid withdrawal symptoms.  How is this diagnosed? Tobacco use disorder is diagnosed by your health care provider. A diagnosis may be made by:  Your health care provider asking questions about your tobacco use and any problems it may be causing.  A physical exam.  Lab tests.  You may be referred to a mental health professional or addiction specialist.  The severity of tobacco use disorder depends on the number of signs and symptoms you have:  Mild-Two or three symptoms.  Moderate-Four or five symptoms.  Severe-Six or more symptoms.  How is this treated? Many people with tobacco use disorder are unable to quit on their own and need help. Treatment options include the  following:  Nicotine replacement therapy (NRT). NRT provides nicotine without the other harmful chemicals in tobacco. NRT gradually lowers the dosage of nicotine in the body and reduces withdrawal symptoms. NRT is available in over-the-counter forms (gum, lozenges, and skin patches) as well as prescription forms (mouth inhaler and nasal spray).  Medicines.This may include: ? Antidepressant medicine that may reduce nicotine cravings. ? A medicine that acts on nicotine receptors in the brain to reduce cravings and withdrawal symptoms. It may also block the effects of tobacco in people with TUD who relapse.  Counseling or talk therapy. A form of talk therapy called behavioral therapy is commonly used to treat people with TUD. Behavioral therapy looks at triggers for tobacco use, how to avoid them, and how to cope with cravings. It is most effective in person or by phone but is also available in self-help forms (books and Internet websites).  Support groups. These provide emotional support, advice, and guidance for quitting tobacco.  The most effective treatment for TUD is usually a combination of medicine, talk therapy, and support groups. Follow these instructions at home:  Keep all follow-up visits as directed by your health care provider. This is important.  Take medicines only as directed by your health care provider.  Check with your health care provider before starting new prescription or over-the-counter medicines. Contact a health care provider if:  You are not able to take your medicines as prescribed.  Treatment is not helping your TUD and your symptoms get worse. Get help right away if:  You have serious thoughts about hurting yourself or others.  You have trouble breathing, chest pain, sudden weakness, or sudden numbness in part of your body. This information is not intended to replace advice given to you by your health care provider. Make sure you discuss any questions you  have with your health care provider. Document Released: 11/28/2003 Document Revised: 11/25/2015 Document Reviewed: 05/20/2013 Elsevier Interactive Patient Education  [employer].
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
## 2017-07-31 ENCOUNTER — Encounter: Payer: Self-pay | Admitting: Family Medicine

## 2017-07-31 LAB — HEPATITIS C ANTIBODY
HEP C AB: NONREACTIVE
SIGNAL TO CUT-OFF: 0.02 (ref <1.00)

## 2017-07-31 LAB — HEMOGLOBIN A1C
EAG (MMOL/L): 7.6 (calc)
Hgb A1c MFr Bld: 6.4 % of total Hgb — ABNORMAL HIGH (ref <5.7)
MEAN PLASMA GLUCOSE: 137 (calc)

## 2017-07-31 LAB — RPR: RPR Ser Ql: NONREACTIVE

## 2017-07-31 LAB — HIV ANTIBODY (ROUTINE TESTING W REFLEX): HIV: NONREACTIVE

## 2017-08-05 ENCOUNTER — Other Ambulatory Visit: Payer: BLUE CROSS/BLUE SHIELD

## 2017-08-07 ENCOUNTER — Ambulatory Visit (HOSPITAL_COMMUNITY)
Admission: RE | Admit: 2017-08-07 | Discharge: 2017-08-07 | Disposition: A | Discharge status: Home / Self Care | Payer: BLUE CROSS/BLUE SHIELD | Source: Ambulatory Visit | Attending: Family Medicine | Admitting: Family Medicine

## 2017-08-07 ENCOUNTER — Encounter (HOSPITAL_COMMUNITY): Payer: Self-pay

## 2017-08-07 DIAGNOSIS — Z1231 Encounter for screening mammogram for malignant neoplasm of breast: Secondary | ICD-10-CM | POA: Diagnosis present

## 2017-08-10 ENCOUNTER — Other Ambulatory Visit: Payer: Self-pay | Admitting: Family Medicine

## 2017-08-10 DIAGNOSIS — R928 Other abnormal and inconclusive findings on diagnostic imaging of breast: Secondary | ICD-10-CM

## 2017-08-21 ENCOUNTER — Other Ambulatory Visit: Payer: Self-pay

## 2017-08-21 ENCOUNTER — Ambulatory Visit (INDEPENDENT_AMBULATORY_CARE_PROVIDER_SITE_OTHER): Payer: BLUE CROSS/BLUE SHIELD | Admitting: Family Medicine

## 2017-08-21 ENCOUNTER — Encounter: Payer: Self-pay | Admitting: Family Medicine

## 2017-08-21 VITALS — BP 112/76 | HR 65 | Temp 98.2°F | Resp 14 | Ht 67.0 in | Wt 177.1 lb

## 2017-08-21 DIAGNOSIS — I10 Essential (primary) hypertension: Secondary | ICD-10-CM | POA: Diagnosis not present

## 2017-08-21 DIAGNOSIS — Z1211 Encounter for screening for malignant neoplasm of colon: Secondary | ICD-10-CM

## 2017-08-21 DIAGNOSIS — I2583 Coronary atherosclerosis due to lipid rich plaque: Secondary | ICD-10-CM | POA: Diagnosis not present

## 2017-08-21 DIAGNOSIS — I2510 Atherosclerotic heart disease of native coronary artery without angina pectoris: Secondary | ICD-10-CM

## 2017-08-21 DIAGNOSIS — N289 Disorder of kidney and ureter, unspecified: Secondary | ICD-10-CM | POA: Diagnosis not present

## 2017-08-21 DIAGNOSIS — Z720 Tobacco use: Secondary | ICD-10-CM

## 2017-08-21 DIAGNOSIS — I739 Peripheral vascular disease, unspecified: Secondary | ICD-10-CM | POA: Diagnosis not present

## 2017-08-21 DIAGNOSIS — R7303 Prediabetes: Secondary | ICD-10-CM

## 2017-08-21 LAB — COMPLETE METABOLIC PANEL WITH GFR
AG Ratio: 1.6 (calc) (ref 1.0–2.5)
ALT: 10 U/L (ref 6–29)
AST: 15 U/L (ref 10–35)
Albumin: 4.7 g/dL (ref 3.6–5.1)
Alkaline phosphatase (APISO): 70 U/L (ref 33–130)
BUN/Creatinine Ratio: 19 (calc) (ref 6–22)
BUN: 32 mg/dL — ABNORMAL HIGH (ref 7–25)
CO2: 25 mmol/L (ref 20–32)
Calcium: 9.9 mg/dL (ref 8.6–10.4)
Chloride: 106 mmol/L (ref 98–110)
Creat: 1.69 mg/dL — ABNORMAL HIGH (ref 0.50–1.05)
GFR, Est African American: 39 mL/min/{1.73_m2} — ABNORMAL LOW (ref >=60)
GFR, Est Non African American: 33 mL/min/{1.73_m2} — ABNORMAL LOW (ref >=60)
Globulin: 3 g/dL (calc) (ref 1.9–3.7)
Glucose, Bld: 115 mg/dL — ABNORMAL HIGH (ref 65–99)
Potassium: 4.6 mmol/L (ref 3.5–5.3)
Sodium: 138 mmol/L (ref 135–146)
Total Bilirubin: 0.7 mg/dL (ref 0.2–1.2)
Total Protein: 7.7 g/dL (ref 6.1–8.1)

## 2017-08-21 LAB — LIPID PANEL
Cholesterol: 220 mg/dL — ABNORMAL HIGH (ref <200)
HDL: 50 mg/dL — ABNORMAL LOW (ref >50)
LDL Cholesterol (Calc): 133 mg/dL (calc) — ABNORMAL HIGH
Non-HDL Cholesterol (Calc): 170 mg/dL (calc) — ABNORMAL HIGH (ref <130)
Total CHOL/HDL Ratio: 4.4 (calc) (ref <5.0)
Triglycerides: 227 mg/dL — ABNORMAL HIGH (ref <150)

## 2017-08-21 MED ORDER — AMLODIPINE BESYLATE 10 MG PO TABS: 10.0000 mg | ORAL_TABLET | Freq: Every day | ORAL | 0 refills | Status: DC

## 2017-08-21 MED ORDER — ATORVASTATIN CALCIUM 80 MG PO TABS
80.0000 mg | ORAL_TABLET | Freq: Every day | ORAL | 0 refills | Status: DC
Start: 2017-08-21 — End: 2021-04-25

## 2017-08-21 NOTE — Patient Instructions (Addendum)
Referral done to kidney doctor and diabetic nutrition Get labs today Start cholesterol medicine Continue BP medication Quit smoking Cut down on white food, sugar, juice, fruit punch and beer Try to stop smoking or at least cut down     Diabetes Mellitus and Nutrition When you have diabetes (diabetes mellitus), it is very important to have healthy eating habits because your blood sugar (glucose) levels are greatly affected by what you eat and drink. Eating healthy foods in the appropriate amounts, at about the same times every day, can help you:  Control your blood glucose.  Lower your risk of heart disease.  Improve your blood pressure.  Reach or maintain a healthy weight.  Every person with diabetes is different, and each person has different needs for a meal plan. Your health care provider may recommend that you work with a diet and nutrition specialist (dietitian) to make a meal plan that is best for you. Your meal plan may vary depending on factors such as:  The calories you need.  The medicines you take.  Your weight.  Your blood glucose, blood pressure, and cholesterol levels.  Your activity level.  Other health conditions you have, such as heart or kidney disease.  How do carbohydrates affect me? Carbohydrates affect your blood glucose level more than any other type of food. Eating carbohydrates naturally increases the amount of glucose in your blood. Carbohydrate counting is a method for keeping track of how many carbohydrates you eat. Counting carbohydrates is important to keep your blood glucose at a healthy level, especially if you use insulin or take certain oral diabetes medicines. It is important to know how many carbohydrates you can safely have in each meal. This is different for every person. Your dietitian can help you calculate how many carbohydrates you should have at each meal and for snack. Foods that contain carbohydrates include:  Bread, cereal, rice,  pasta, and crackers.  Potatoes and corn.  Peas, beans, and lentils.  Milk and yogurt.  Fruit and juice.  Desserts, such as cakes, cookies, ice cream, and candy.  How does alcohol affect me? Alcohol can cause a sudden decrease in blood glucose (hypoglycemia), especially if you use insulin or take certain oral diabetes medicines. Hypoglycemia can be a life-threatening condition. Symptoms of hypoglycemia (sleepiness, dizziness, and confusion) are similar to symptoms of having too much alcohol. If your health care provider says that alcohol is safe for you, follow these guidelines:  Limit alcohol intake to no more than 1 drink per day for nonpregnant women and 2 drinks per day for men. One drink equals 12 oz of beer, 5 oz of wine, or 1 oz of hard liquor.  Do not drink on an empty stomach.  Keep yourself hydrated with water, diet soda, or unsweetened iced tea.  Keep in mind that regular soda, juice, and other mixers may contain a lot of sugar and must be counted as carbohydrates.  What are tips for following this plan? Reading food labels  Start by checking the serving size on the label. The amount of calories, carbohydrates, fats, and other nutrients listed on the label are based on one serving of the food. Many foods contain more than one serving per package.  Check the total grams (g) of carbohydrates in one serving. You can calculate the number of servings of carbohydrates in one serving by dividing the total carbohydrates by 15. For example, if a food has 30 g of total carbohydrates, it would be equal to 2  servings of carbohydrates.  Check the number of grams (g) of saturated and trans fats in one serving. Choose foods that have low or no amount of these fats.  Check the number of milligrams (mg) of sodium in one serving. Most people should limit total sodium intake to less than 2,300 mg per day.  Always check the nutrition information of foods labeled as "low-fat" or "nonfat".  These foods may be higher in added sugar or refined carbohydrates and should be avoided.  Talk to your dietitian to identify your daily goals for nutrients listed on the label. Shopping  Avoid buying canned, premade, or processed foods. These foods tend to be high in fat, sodium, and added sugar.  Shop around the outside edge of the grocery store. This includes fresh fruits and vegetables, bulk grains, fresh meats, and fresh dairy. Cooking  Use low-heat cooking methods, such as baking, instead of high-heat cooking methods like deep frying.  Cook using healthy oils, such as olive, canola, or sunflower oil.  Avoid cooking with butter, cream, or high-fat meats. Meal planning  Eat meals and snacks regularly, preferably at the same times every day. Avoid going long periods of time without eating.  Eat foods high in fiber, such as fresh fruits, vegetables, beans, and whole grains. Talk to your dietitian about how many servings of carbohydrates you can eat at each meal.  Eat 4-6 ounces of lean protein each day, such as lean meat, chicken, fish, eggs, or tofu. 1 ounce is equal to 1 ounce of meat, chicken, or fish, 1 egg, or 1/4 cup of tofu.  Eat some foods each day that contain healthy fats, such as avocado, nuts, seeds, and fish. Lifestyle   Check your blood glucose regularly.  Exercise at least 30 minutes 5 or more days each week, or as told by your health care provider.  Take medicines as told by your health care provider.  Do not use any products that contain nicotine or tobacco, such as cigarettes and e-cigarettes. If you need help quitting, ask your health care provider.  Work with a Social worker or diabetes educator to identify strategies to manage stress and any emotional and social challenges. What are some questions to ask my health care provider?  Do I need to meet with a diabetes educator?  Do I need to meet with a dietitian?  What number can I call if I have  questions?  When are the best times to check my blood glucose? Where to find more information:  American Diabetes Association: diabetes.org/food-and-fitness/food  Academy of Nutrition and Dietetics: PokerClues.dk  [employer] of Diabetes and Digestive and Kidney Diseases (NIH): ContactWire.be Summary  A healthy meal plan will help you control your blood glucose and maintain a healthy lifestyle.  Working with a diet and nutrition specialist (dietitian) can help you make a meal plan that is best for you.  Keep in mind that carbohydrates and alcohol have immediate effects on your blood glucose levels. It is important to count carbohydrates and to use alcohol carefully. This information is not intended to replace advice given to you by your health care provider. Make sure you discuss any questions you have with your health care provider. Document Released: 12/19/2004 Document Revised: 04/28/2016 Document Reviewed: 04/28/2016 Elsevier Interactive Patient Education  [employer].
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
## 2017-08-26 ENCOUNTER — Telehealth: Payer: Self-pay

## 2017-08-26 DIAGNOSIS — Z1211 Encounter for screening for malignant neoplasm of colon: Secondary | ICD-10-CM

## 2017-08-26 NOTE — Telephone Encounter (Signed)
cologuard ordered.

## 2017-08-26 NOTE — Progress Notes (Signed)
lvm requesting call back

## 2017-08-27 NOTE — Progress Notes (Signed)
Left message requesting  call back.

## 2017-09-03 NOTE — Progress Notes (Signed)
lvm requesting call back.

## 2017-09-08 ENCOUNTER — Encounter (HOSPITAL_COMMUNITY): Payer: BLUE CROSS/BLUE SHIELD

## 2017-09-08 ENCOUNTER — Encounter (HOSPITAL_COMMUNITY): Payer: Self-pay

## 2017-09-10 ENCOUNTER — Encounter: Payer: Self-pay | Admitting: Family Medicine

## 2017-09-11 ENCOUNTER — Encounter: Payer: Self-pay | Admitting: Family Medicine

## 2017-09-21 ENCOUNTER — Other Ambulatory Visit: Payer: Self-pay | Admitting: Nephrology

## 2017-09-21 DIAGNOSIS — N183 Chronic kidney disease, stage 3 unspecified: Secondary | ICD-10-CM

## 2017-09-21 DIAGNOSIS — I129 Hypertensive chronic kidney disease with stage 1 through stage 4 chronic kidney disease, or unspecified chronic kidney disease: Secondary | ICD-10-CM

## 2017-09-24 ENCOUNTER — Telehealth: Payer: Self-pay | Admitting: Family Medicine

## 2017-09-24 ENCOUNTER — Other Ambulatory Visit: Payer: Self-pay

## 2017-09-24 DIAGNOSIS — I10 Essential (primary) hypertension: Secondary | ICD-10-CM

## 2017-09-24 MED ORDER — CLONIDINE HCL 0.3 MG PO TABS: 0.3000 mg | ORAL_TABLET | Freq: Three times a day (TID) | ORAL | 0 refills | Status: DC

## 2017-09-24 MED ORDER — CARVEDILOL 25 MG PO TABS: 25.0000 mg | ORAL_TABLET | Freq: Two times a day (BID) | ORAL | 0 refills | Status: DC

## 2017-09-24 MED ORDER — AMLODIPINE BESYLATE 10 MG PO TABS: 10.0000 mg | ORAL_TABLET | Freq: Every day | ORAL | 0 refills | Status: DC

## 2017-09-24 NOTE — Telephone Encounter (Signed)
Refills sent in as per patient request.

## 2017-09-24 NOTE — Telephone Encounter (Signed)
Amlodipine , Carvedilol, Clonidine.. Can she get a  90 day supply, sent to [facility].

## 2017-10-06 ENCOUNTER — Inpatient Hospital Stay: Admission: RE | Admit: 2017-10-06 | Payer: BLUE CROSS/BLUE SHIELD | Source: Ambulatory Visit

## 2017-10-06 ENCOUNTER — Other Ambulatory Visit: Payer: BLUE CROSS/BLUE SHIELD

## 2017-10-06 ENCOUNTER — Emergency Department (HOSPITAL_COMMUNITY): Payer: BLUE CROSS/BLUE SHIELD

## 2017-10-06 ENCOUNTER — Encounter (HOSPITAL_COMMUNITY): Payer: Self-pay | Admitting: Emergency Medicine

## 2017-10-06 ENCOUNTER — Other Ambulatory Visit: Payer: Self-pay

## 2017-10-06 ENCOUNTER — Observation Stay (HOSPITAL_COMMUNITY)
Admission: EM | Admit: 2017-10-06 | Discharge: 2017-10-07 | Disposition: A | Discharge status: Home / Self Care | Payer: BLUE CROSS/BLUE SHIELD | Attending: Emergency Medicine | Admitting: Emergency Medicine

## 2017-10-06 DIAGNOSIS — I2510 Atherosclerotic heart disease of native coronary artery without angina pectoris: Secondary | ICD-10-CM | POA: Insufficient documentation

## 2017-10-06 DIAGNOSIS — R072 Precordial pain: Principal | ICD-10-CM | POA: Insufficient documentation

## 2017-10-06 DIAGNOSIS — F17210 Nicotine dependence, cigarettes, uncomplicated: Secondary | ICD-10-CM | POA: Diagnosis not present

## 2017-10-06 DIAGNOSIS — Z7982 Long term (current) use of aspirin: Secondary | ICD-10-CM | POA: Diagnosis not present

## 2017-10-06 DIAGNOSIS — Z79899 Other long term (current) drug therapy: Secondary | ICD-10-CM | POA: Diagnosis not present

## 2017-10-06 DIAGNOSIS — I2583 Coronary atherosclerosis due to lipid rich plaque: Secondary | ICD-10-CM | POA: Insufficient documentation

## 2017-10-06 DIAGNOSIS — I110 Hypertensive heart disease with heart failure: Secondary | ICD-10-CM | POA: Diagnosis not present

## 2017-10-06 DIAGNOSIS — I5032 Chronic diastolic (congestive) heart failure: Secondary | ICD-10-CM | POA: Diagnosis not present

## 2017-10-06 DIAGNOSIS — R079 Chest pain, unspecified: Secondary | ICD-10-CM | POA: Diagnosis present

## 2017-10-06 LAB — CBC
HEMATOCRIT: 34.5 % — AB (ref 36.0–46.0)
Hemoglobin: 11.6 g/dL — ABNORMAL LOW (ref 12.0–15.0)
MCH: 33 pg (ref 26.0–34.0)
MCHC: 33.6 g/dL (ref 30.0–36.0)
MCV: 98.3 fL (ref 78.0–100.0)
Platelets: 246 10*3/uL (ref 150–400)
RBC: 3.51 MIL/uL — ABNORMAL LOW (ref 3.87–5.11)
RDW: 16.6 % — AB (ref 11.5–15.5)
WBC: 7.8 10*3/uL (ref 4.0–10.5)

## 2017-10-06 LAB — BASIC METABOLIC PANEL
Anion gap: 8 (ref 5–15)
BUN: 27 mg/dL — AB (ref 6–20)
CO2: 20 mmol/L — ABNORMAL LOW (ref 22–32)
Calcium: 8.9 mg/dL (ref 8.9–10.3)
Chloride: 111 mmol/L (ref 98–111)
Creatinine, Ser: 1.4 mg/dL — ABNORMAL HIGH (ref 0.44–1.00)
GFR calc Af Amer: 48 mL/min — ABNORMAL LOW (ref >60)
GFR calc non Af Amer: 41 mL/min — ABNORMAL LOW (ref >60)
GLUCOSE: 108 mg/dL — AB (ref 70–99)
POTASSIUM: 4 mmol/L (ref 3.5–5.1)
Sodium: 139 mmol/L (ref 135–145)

## 2017-10-06 LAB — TROPONIN I: Troponin I: 0.04 ng/mL (ref <0.03)

## 2017-10-06 MED ORDER — ASPIRIN 81 MG PO CHEW
324.0000 mg | CHEWABLE_TABLET | Freq: Once | ORAL | Status: AC
  Administered 2017-10-06: 324 mg via ORAL
  Filled 2017-10-06: qty 4

## 2017-10-06 NOTE — ED Notes (Signed)
CRITICAL VALUE ALERT  Critical Value:  Troponin 0.04  Date & Time Notied:  10/06/17 & 2239 hrs  Provider Notified: L. Sophia, PA  Orders Received/Actions taken: N/A

## 2017-10-06 NOTE — ED Triage Notes (Signed)
Pt c/o central chest pain x 2 hours. Pt also c/o dizziness and sob at times.

## 2017-10-07 ENCOUNTER — Emergency Department (HOSPITAL_COMMUNITY): Payer: BLUE CROSS/BLUE SHIELD

## 2017-10-07 DIAGNOSIS — R079 Chest pain, unspecified: Secondary | ICD-10-CM

## 2017-10-07 DIAGNOSIS — I2583 Coronary atherosclerosis due to lipid rich plaque: Secondary | ICD-10-CM | POA: Diagnosis not present

## 2017-10-07 DIAGNOSIS — I2510 Atherosclerotic heart disease of native coronary artery without angina pectoris: Secondary | ICD-10-CM

## 2017-10-07 LAB — BASIC METABOLIC PANEL
Anion gap: 8 (ref 5–15)
BUN: 24 mg/dL — AB (ref 6–20)
CO2: 21 mmol/L — AB (ref 22–32)
Calcium: 8.8 mg/dL — ABNORMAL LOW (ref 8.9–10.3)
Chloride: 109 mmol/L (ref 98–111)
Creatinine, Ser: 1.28 mg/dL — ABNORMAL HIGH (ref 0.44–1.00)
GFR calc Af Amer: 53 mL/min — ABNORMAL LOW (ref >60)
GFR calc non Af Amer: 46 mL/min — ABNORMAL LOW (ref >60)
GLUCOSE: 148 mg/dL — AB (ref 70–99)
POTASSIUM: 3.8 mmol/L (ref 3.5–5.1)
Sodium: 138 mmol/L (ref 135–145)

## 2017-10-07 LAB — TROPONIN I
Troponin I: 0.04 ng/mL (ref <0.03)
Troponin I: 0.04 ng/mL (ref <0.03)

## 2017-10-07 MED ORDER — AMLODIPINE BESYLATE 5 MG PO TABS
10.0000 mg | ORAL_TABLET | Freq: Every day | ORAL | Status: DC
  Administered 2017-10-07: 10 mg via ORAL
  Filled 2017-10-07: qty 2

## 2017-10-07 MED ORDER — ACETAMINOPHEN 325 MG PO TABS: 650.0000 mg | ORAL_TABLET | ORAL | Status: DC | PRN

## 2017-10-07 MED ORDER — ASPIRIN EC 81 MG PO TBEC
81.0000 mg | DELAYED_RELEASE_TABLET | Freq: Every day | ORAL | Status: DC
  Administered 2017-10-07: 81 mg via ORAL
  Filled 2017-10-07: qty 1

## 2017-10-07 MED ORDER — IOPAMIDOL (ISOVUE-370) INJECTION 76%
100.0000 mL | Freq: Once | INTRAVENOUS | Status: AC | PRN
  Administered 2017-10-07: 100 mL via INTRAVENOUS

## 2017-10-07 MED ORDER — NITROGLYCERIN 0.4 MG SL SUBL: 0.4000 mg | SUBLINGUAL_TABLET | SUBLINGUAL | Status: DC | PRN

## 2017-10-07 MED ORDER — CLONIDINE HCL 0.2 MG PO TABS
0.3000 mg | ORAL_TABLET | Freq: Three times a day (TID) | ORAL | Status: DC
  Administered 2017-10-07: 0.3 mg via ORAL
  Filled 2017-10-07: qty 1

## 2017-10-07 MED ORDER — ATORVASTATIN CALCIUM 40 MG PO TABS
80.0000 mg | ORAL_TABLET | Freq: Every day | ORAL | Status: DC
  Administered 2017-10-07: 80 mg via ORAL
  Filled 2017-10-07: qty 2

## 2017-10-07 MED ORDER — HYDRALAZINE HCL 25 MG PO TABS: 100.0000 mg | ORAL_TABLET | Freq: Four times a day (QID) | ORAL | Status: DC

## 2017-10-07 MED ORDER — ENOXAPARIN SODIUM 40 MG/0.4ML ~~LOC~~ SOLN: 40.0000 mg | SUBCUTANEOUS | Status: DC

## 2017-10-07 MED ORDER — CARVEDILOL 12.5 MG PO TABS
25.0000 mg | ORAL_TABLET | Freq: Two times a day (BID) | ORAL | Status: DC
  Administered 2017-10-07: 25 mg via ORAL
  Filled 2017-10-07: qty 2

## 2017-10-07 MED ORDER — ONDANSETRON HCL 4 MG/2ML IJ SOLN: 4.0000 mg | Freq: Four times a day (QID) | INTRAMUSCULAR | Status: DC | PRN

## 2017-10-07 NOTE — Progress Notes (Signed)
Patient states understanding of discharge instructions.  
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
## 2017-10-07 NOTE — Discharge Summary (Signed)
Physician Discharge Summary  Dorothy Harris UXN:[id number] DOB: [birth year] DOA: 10/06/2017  PCP: Aliene Beams, MD  Admit date: 10/06/2017  Discharge date: 10/07/2017  Admitted From:Home  Disposition: Home   Recommendations for Outpatient Follow-up:  1. Follow up with PCP in 4 weeks. 2. Follow up with Cardiology PA Lenze on 7/22 as scheduled  Home Health:None  Equipment/Devices:None  Discharge Condition:Stable  CODE STATUS: Full  Diet recommendation: Heart Healthy  Brief/Interim Summary:  Dorothy Harris  is a 57 y.o. female, with history of cocaine abuse, hypertension, thoracic aortic dissection type B followed by CVTS as outpatient,prediabetes, coronary artery disease, tobacco abuse came to hospital with complaints of chest pain which started around 6:30 PM on 7/2.  She stated that the chest pain was persistent and substernal.  This was not related to any activity and there is no radiation of pain.  She denied any nausea, dyspnea, diaphoresis, palpitations, dizziness, or other symptomatology.  She was observed overnight with 3 sets of troponin at 0.04.  EKG with no acute findings.  CTA of the chest demonstrated stable type B aortic dissection and aortic aneurysm as noted previously.  She has follow-up scheduled with CV surgery to monitor this finding and she will follow-up with cardiology in the outpatient setting in 2 to 3 weeks to consider any need for potential stress testing.   Discharge Diagnoses:  Active Problems:   Chest pain   Coronary artery disease due to lipid rich plaque  1. Atypical chest pain-resolved.  Heart score of 3.  We will follow-up with cardiology in the outpatient setting as scheduled on 7/22.  Follow-up with CV surgery as previously scheduled as well as with PCP in 4 weeks.  Continue home medications as prior. 2. Hypertension-controlled.  Continue home medications as previously prescribed. 3. Thoracic aortic dissection type B.  Stable per CT angiogram. 4. CKD  stage III.  Stable.  Discharge Instructions  Discharge Instructions    Diet - low sodium heart healthy   Complete by:  As directed    Increase activity slowly   Complete by:  As directed      Allergies as of 10/07/2017   No Known Allergies     Medication List    TAKE these medications   acetaminophen 500 MG tablet Commonly known as:  TYLENOL Take 500 mg by mouth every 6 (six) hours as needed for mild pain or moderate pain.   amLODipine 10 MG tablet Commonly known as:  NORVASC Take 1 tablet (10 mg total) by mouth daily.   aspirin 81 MG EC tablet Take 1 tablet (81 mg total) by mouth daily.   atorvastatin 80 MG tablet Commonly known as:  LIPITOR Take 1 tablet (80 mg total) by mouth daily.   carvedilol 25 MG tablet Commonly known as:  COREG Take 1 tablet (25 mg total) by mouth 2 (two) times daily with a meal.   cloNIDine 0.3 MG tablet Commonly known as:  CATAPRES Take 1 tablet (0.3 mg total) by mouth 3 (three) times daily.   hydrALAZINE 100 MG tablet Commonly known as:  APRESOLINE Take 1 tablet (100 mg total) by mouth every 6 (six) hours.   nitroGLYCERIN 0.4 MG SL tablet Commonly known as:  NITROSTAT Place 1 tablet (0.4 mg total) under the tongue every 5 (five) minutes as needed for chest pain.   VITAMIN B-1 PO Take 1 tablet by mouth daily.      Follow-up Information    Dyann Kief, PA-C Follow up on 10/26/2017.  Specialty:  Cardiology Why:  [facility] Follow-Up on 10/26/2017 at 2:00PM.  Contact information: [place] [address] [phone]        Caren Macadam, MD Follow up in 4 week(s).   Specialty:  Family Medicine Contact information: [place] [address] [phone]        Sueanne Margarita, MD .   Specialty:  Cardiology Contact information: [phone] [address] [place] [address] [phone]          No Known  Allergies  Consultations:  None   Procedures/Studies: Dg Chest 2 View  Result Date: 10/06/2017 CLINICAL DATA:  Chest pain, shortness of breath, and dizziness since earlier today. EXAM: CHEST - 2 VIEW COMPARISON:  06/06/2017 FINDINGS: Normal heart size and pulmonary vascularity. Tortuous and ectatic thoracic aorta with aortic calcification. No change in the appearance of the aorta since previous study. Linear scarring in the left mid lung. Lungs appear otherwise clear and expanded. No blunting of costophrenic angles. No pneumothorax. Mediastinal contours appear intact. IMPRESSION: No active cardiopulmonary disease. Electronically Signed   By: Lucienne Capers M.D.   On: 10/06/2017 22:06   Ct Angio Chest Aorta W &/or Wo Contrast  Result Date: 10/07/2017 CLINICAL DATA:  history of cocaine abuse, hypertension, thoracic aortic dissection type B ,prediabetes, coronary artery disease, tobacco abuse came to hospital with complaints of chest pain which started around 6:30 PM. EXAM: CT ANGIOGRAPHY CHEST WITH CONTRAST TECHNIQUE: Multidetector CT imaging of the chest was performed using the standard protocol during bolus administration of intravenous contrast. Multiplanar CT image reconstructions and MIPs were obtained to evaluate the vascular anatomy. CONTRAST:  133mL ISOVUE-370 IOPAMIDOL (ISOVUE-370) INJECTION 76% COMPARISON:  05/31/2017 FINDINGS: Cardiovascular: Noncontrast images of the chest demonstrate no evidence of intramural hematoma. Scattered aortic calcifications. Calcification in the coronary arteries. Images obtained during arterial phase after contrast injection demonstrate a dilated descending thoracic aorta, measuring up to about 4.2 cm diameter. There is an aortic dissection beginning in the arch and extending along the descending thoracic aorta to above the abdominal hiatus. Flow is demonstrated in both true and false lumens. Dissection extends into the origins of the left common carotid and left  subclavian arteries. Appearance is unchanged since previous study. No evidence of aortic rupture or contrast extravasation. Great vessel origins are patent. Central pulmonary arteries are moderately well opacified without any large central pulmonary embolus. Normal heart size. No pericardial effusion. Mediastinum/Nodes: Scattered mediastinal lymph nodes are not pathologically enlarged. Esophagus is decompressed. Thyroid gland is unremarkable. Lungs/Pleura: Mild scarring in the lung bases. No airspace disease or consolidation. No pleural effusions. No pneumothorax. Airways are patent. Upper Abdomen: No acute process demonstrated in the visualized upper abdomen. Diffuse fatty infiltration of the liver. Cyst in the caudate lobe measuring 4.6 cm diameter. No change since prior study. Musculoskeletal: Mild degenerative changes in the spine. No destructive bone lesions. Review of the MIP images confirms the above findings. IMPRESSION: 1. Stable appearance of aortic dissection beginning in the arch and extending along the descending thoracic aorta to above the abdominal hiatus. Dissection extends to the origins of the left common carotid and left subclavian arteries. No evidence of aortic rupture or contrast extravasation. 2. Stable appearance of dilated descending thoracic aorta measuring up to 4.2 cm diameter. Recommend annual imaging followup by CTA or MRA. This recommendation follows 2010 ACCF/AHA/AATS/ACR/ASA/SCA/SCAI/SIR/STS/SVM Guidelines for the Diagnosis and Management of Patients with Thoracic Aortic Disease. Circulation. 2010; 121: P329-J188. Aortic Atherosclerosis (  ICD10-I70.0). Electronically Signed   By: Lucienne Capers M.D.   On: 10/07/2017 03:37   Discharge Exam: Vitals:   10/07/17 0356 10/07/17 0500  BP: (!) 170/82 140/80  Pulse: (!) 51 64  Resp: 16 17  Temp: 98 F (36.7 C) 98.4 F (36.9 C)  SpO2: 100% 100%   Vitals:   10/07/17 0300 10/07/17 0330 10/07/17 0356 10/07/17 0500  BP: (!) 146/96  133/80 (!) 170/82 140/80  Pulse: (!) 56 61 (!) 51 64  Resp: $Remo'18 19 16 17  'NHCJb$ Temp:   98 F (36.7 C) 98.4 F (36.9 C)  TempSrc:   Oral Oral  SpO2: 97% 96% 100% 100%  Weight:    79.8 kg (175 lb 14.8 oz)  Height:        General: Pt is alert, awake, not in acute distress Cardiovascular: RRR, S1/S2 +, no rubs, no gallops Respiratory: CTA bilaterally, no wheezing, no rhonchi Abdominal: Soft, NT, ND, bowel sounds + Extremities: no edema, no cyanosis    The results of significant diagnostics from this hospitalization (including imaging, microbiology, ancillary and laboratory) are listed below for reference.     Microbiology: No results found for this or any previous visit (from the past 240 hour(s)).   Labs: BNP (last 3 results) Recent Labs    05/31/17 0635 06/01/17 1112  BNP 3,012.0* 4,403.4*   Basic Metabolic Panel: Recent Labs  Lab 10/06/17 2134 10/07/17 0720  NA 139 138  K 4.0 3.8  CL 111 109  CO2 20* 21*  GLUCOSE 108* 148*  BUN 27* 24*  CREATININE 1.40* 1.28*  CALCIUM 8.9 8.8*   Liver Function Tests: No results for input(s): AST, ALT, ALKPHOS, BILITOT, PROT, ALBUMIN in the last 168 hours. No results for input(s): LIPASE, AMYLASE in the last 168 hours. No results for input(s): AMMONIA in the last 168 hours. CBC: Recent Labs  Lab 10/06/17 2134  WBC 7.8  HGB 11.6*  HCT 34.5*  MCV 98.3  PLT 246   Cardiac Enzymes: Recent Labs  Lab 10/06/17 2134 10/07/17 0116 10/07/17 0720  TROPONINI 0.04* 0.04* 0.04*   BNP: Invalid input(s): POCBNP CBG: No results for input(s): GLUCAP in the last 168 hours. D-Dimer No results for input(s): DDIMER in the last 72 hours. Hgb A1c No results for input(s): HGBA1C in the last 72 hours. Lipid Profile No results for input(s): CHOL, HDL, LDLCALC, TRIG, CHOLHDL, LDLDIRECT in the last 72 hours. Thyroid function studies No results for input(s): TSH, T4TOTAL, T3FREE, THYROIDAB in the last 72 hours.  Invalid input(s):  FREET3 Anemia work up No results for input(s): VITAMINB12, FOLATE, FERRITIN, TIBC, IRON, RETICCTPCT in the last 72 hours. Urinalysis    Component Value Date/Time   COLORURINE YELLOW 05/31/2017 1407   APPEARANCEUR CLEAR 05/31/2017 1407   LABSPEC 1.024 05/31/2017 1407   PHURINE 5.0 05/31/2017 1407   GLUCOSEU NEGATIVE 05/31/2017 1407   HGBUR NEGATIVE 05/31/2017 1407   BILIRUBINUR NEGATIVE 05/31/2017 1407   KETONESUR NEGATIVE 05/31/2017 1407   PROTEINUR 30 (A) 05/31/2017 1407   UROBILINOGEN 0.2 05/26/2008 1446   NITRITE NEGATIVE 05/31/2017 1407   LEUKOCYTESUR NEGATIVE 05/31/2017 1407   Sepsis Labs Invalid input(s): PROCALCITONIN,  WBC,  LACTICIDVEN Microbiology No results found for this or any previous visit (from the past 240 hour(s)).   Time coordinating discharge: 35 minutes  SIGNED:   Rodena Goldmann, DO [facility] 10/07/2017, 8:50 AM Pager [phone]  If 7PM-7AM, please contact night-coverage [email or web address] Password [email or web address]
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
## 2017-10-12 ENCOUNTER — Telehealth: Payer: Self-pay

## 2017-10-12 NOTE — Telephone Encounter (Signed)
[id number]  Attempted to contact patient to complete [facility] telephone call. No answer. Left message requesting call back. Will try again later. 3rd attempt

## 2017-10-12 NOTE — Telephone Encounter (Signed)
[id number]  Attempted to contact patient to complete [facility] telephone call. No answer. Left message requesting call back. Will try again later. 1st attempt

## 2017-10-12 NOTE — Telephone Encounter (Signed)
[id number]  Attempted to contact patient to complete [facility] telephone call. No answer. Left message requesting call back. Will try again later. 2nd attempt

## 2017-10-13 ENCOUNTER — Telehealth: Payer: Self-pay

## 2017-10-13 NOTE — Telephone Encounter (Signed)
Left message requesting call to give patient appointment information: Patient is scheduled for diagnostic mammogram at: [facility] Date:  October 27, 2017 Time: 9:30am No lotions, perfumes, deodorants

## 2017-10-16 ENCOUNTER — Ambulatory Visit: Payer: BLUE CROSS/BLUE SHIELD | Admitting: Vascular Surgery

## 2017-10-20 ENCOUNTER — Other Ambulatory Visit (HOSPITAL_COMMUNITY): Payer: Self-pay | Admitting: Nephrology

## 2017-10-20 ENCOUNTER — Other Ambulatory Visit: Payer: Self-pay | Admitting: Nephrology

## 2017-10-20 DIAGNOSIS — N183 Chronic kidney disease, stage 3 unspecified: Secondary | ICD-10-CM

## 2017-10-20 DIAGNOSIS — I129 Hypertensive chronic kidney disease with stage 1 through stage 4 chronic kidney disease, or unspecified chronic kidney disease: Secondary | ICD-10-CM

## 2017-10-26 ENCOUNTER — Ambulatory Visit: Payer: BLUE CROSS/BLUE SHIELD | Admitting: Physician Assistant

## 2017-10-26 DIAGNOSIS — R0989 Other specified symptoms and signs involving the circulatory and respiratory systems: Secondary | ICD-10-CM
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
## 2017-10-27 ENCOUNTER — Encounter (HOSPITAL_COMMUNITY): Payer: BLUE CROSS/BLUE SHIELD

## 2017-10-27 ENCOUNTER — Encounter: Payer: Self-pay | Admitting: Physician Assistant

## 2017-11-24 ENCOUNTER — Ambulatory Visit: Payer: BLUE CROSS/BLUE SHIELD | Admitting: Family Medicine

## 2021-03-24 ENCOUNTER — Inpatient Hospital Stay (HOSPITAL_COMMUNITY)
Admission: EM | Admit: 2021-03-24 | Discharge: 2021-04-15 | DRG: 219 | Disposition: A | Discharge status: Rehabilitation Facility | Payer: PRIVATE HEALTH INSURANCE | Attending: Internal Medicine | Admitting: Internal Medicine

## 2021-03-24 ENCOUNTER — Emergency Department (HOSPITAL_COMMUNITY): Payer: PRIVATE HEALTH INSURANCE

## 2021-03-24 ENCOUNTER — Emergency Department (HOSPITAL_COMMUNITY): Payer: PRIVATE HEALTH INSURANCE | Admitting: Certified Registered Nurse Anesthetist

## 2021-03-24 ENCOUNTER — Other Ambulatory Visit: Payer: Self-pay

## 2021-03-24 ENCOUNTER — Encounter (HOSPITAL_COMMUNITY): Payer: Self-pay | Admitting: Emergency Medicine

## 2021-03-24 ENCOUNTER — Encounter (HOSPITAL_COMMUNITY)
Admission: EM | Disposition: A | Discharge status: Rehabilitation Facility | Payer: Self-pay | Source: Home / Self Care | Attending: Surgery

## 2021-03-24 DIAGNOSIS — Z9911 Dependence on respirator [ventilator] status: Secondary | ICD-10-CM

## 2021-03-24 DIAGNOSIS — I340 Nonrheumatic mitral (valve) insufficiency: Secondary | ICD-10-CM | POA: Diagnosis present

## 2021-03-24 DIAGNOSIS — J939 Pneumothorax, unspecified: Secondary | ICD-10-CM

## 2021-03-24 DIAGNOSIS — Z9889 Other specified postprocedural states: Secondary | ICD-10-CM

## 2021-03-24 DIAGNOSIS — I71019 Dissection of thoracic aorta, unspecified: Secondary | ICD-10-CM | POA: Diagnosis present

## 2021-03-24 DIAGNOSIS — K631 Perforation of intestine (nontraumatic): Secondary | ICD-10-CM | POA: Diagnosis present

## 2021-03-24 DIAGNOSIS — Z9689 Presence of other specified functional implants: Secondary | ICD-10-CM

## 2021-03-24 DIAGNOSIS — Z95828 Presence of other vascular implants and grafts: Secondary | ICD-10-CM

## 2021-03-24 DIAGNOSIS — Z5189 Encounter for other specified aftercare: Secondary | ICD-10-CM

## 2021-03-24 DIAGNOSIS — J9601 Acute respiratory failure with hypoxia: Secondary | ICD-10-CM | POA: Diagnosis not present

## 2021-03-24 DIAGNOSIS — I7122 Aneurysm of the aortic arch, without rupture: Secondary | ICD-10-CM | POA: Diagnosis present

## 2021-03-24 DIAGNOSIS — N184 Chronic kidney disease, stage 4 (severe): Secondary | ICD-10-CM | POA: Diagnosis present

## 2021-03-24 DIAGNOSIS — K559 Vascular disorder of intestine, unspecified: Secondary | ICD-10-CM | POA: Diagnosis present

## 2021-03-24 DIAGNOSIS — J9811 Atelectasis: Secondary | ICD-10-CM | POA: Diagnosis not present

## 2021-03-24 DIAGNOSIS — I7101 Dissection of ascending aorta: Principal | ICD-10-CM | POA: Diagnosis present

## 2021-03-24 DIAGNOSIS — Z841 Family history of disorders of kidney and ureter: Secondary | ICD-10-CM

## 2021-03-24 DIAGNOSIS — Z6837 Body mass index (BMI) 37.0-37.9, adult: Secondary | ICD-10-CM

## 2021-03-24 DIAGNOSIS — J3489 Other specified disorders of nose and nasal sinuses: Secondary | ICD-10-CM | POA: Diagnosis not present

## 2021-03-24 DIAGNOSIS — K658 Other peritonitis: Secondary | ICD-10-CM | POA: Diagnosis present

## 2021-03-24 DIAGNOSIS — I312 Hemopericardium, not elsewhere classified: Secondary | ICD-10-CM | POA: Diagnosis present

## 2021-03-24 DIAGNOSIS — K660 Peritoneal adhesions (postprocedural) (postinfection): Secondary | ICD-10-CM | POA: Diagnosis present

## 2021-03-24 DIAGNOSIS — K430 Incisional hernia with obstruction, without gangrene: Secondary | ICD-10-CM | POA: Diagnosis present

## 2021-03-24 DIAGNOSIS — I130 Hypertensive heart and chronic kidney disease with heart failure and stage 1 through stage 4 chronic kidney disease, or unspecified chronic kidney disease: Secondary | ICD-10-CM | POA: Diagnosis present

## 2021-03-24 DIAGNOSIS — K659 Peritonitis, unspecified: Secondary | ICD-10-CM | POA: Diagnosis present

## 2021-03-24 DIAGNOSIS — Z8249 Family history of ischemic heart disease and other diseases of the circulatory system: Secondary | ICD-10-CM

## 2021-03-24 DIAGNOSIS — D539 Nutritional anemia, unspecified: Secondary | ICD-10-CM | POA: Diagnosis present

## 2021-03-24 DIAGNOSIS — R140 Abdominal distension (gaseous): Secondary | ICD-10-CM

## 2021-03-24 DIAGNOSIS — K562 Volvulus: Secondary | ICD-10-CM | POA: Diagnosis present

## 2021-03-24 DIAGNOSIS — K567 Ileus, unspecified: Secondary | ICD-10-CM | POA: Diagnosis not present

## 2021-03-24 DIAGNOSIS — K439 Ventral hernia without obstruction or gangrene: Secondary | ICD-10-CM

## 2021-03-24 DIAGNOSIS — N179 Acute kidney failure, unspecified: Secondary | ICD-10-CM | POA: Diagnosis not present

## 2021-03-24 DIAGNOSIS — F411 Generalized anxiety disorder: Secondary | ICD-10-CM | POA: Diagnosis present

## 2021-03-24 DIAGNOSIS — Z833 Family history of diabetes mellitus: Secondary | ICD-10-CM

## 2021-03-24 DIAGNOSIS — I5033 Acute on chronic diastolic (congestive) heart failure: Secondary | ICD-10-CM | POA: Diagnosis not present

## 2021-03-24 DIAGNOSIS — F17210 Nicotine dependence, cigarettes, uncomplicated: Secondary | ICD-10-CM | POA: Diagnosis present

## 2021-03-24 DIAGNOSIS — E1122 Type 2 diabetes mellitus with diabetic chronic kidney disease: Secondary | ICD-10-CM | POA: Diagnosis present

## 2021-03-24 DIAGNOSIS — J9801 Acute bronchospasm: Secondary | ICD-10-CM | POA: Diagnosis not present

## 2021-03-24 DIAGNOSIS — E1165 Type 2 diabetes mellitus with hyperglycemia: Secondary | ICD-10-CM | POA: Diagnosis not present

## 2021-03-24 DIAGNOSIS — D696 Thrombocytopenia, unspecified: Secondary | ICD-10-CM | POA: Diagnosis not present

## 2021-03-24 DIAGNOSIS — I161 Hypertensive emergency: Secondary | ICD-10-CM | POA: Diagnosis not present

## 2021-03-24 DIAGNOSIS — Z789 Other specified health status: Secondary | ICD-10-CM

## 2021-03-24 DIAGNOSIS — J90 Pleural effusion, not elsewhere classified: Secondary | ICD-10-CM

## 2021-03-24 DIAGNOSIS — Z978 Presence of other specified devices: Secondary | ICD-10-CM | POA: Diagnosis not present

## 2021-03-24 DIAGNOSIS — Z20822 Contact with and (suspected) exposure to covid-19: Secondary | ICD-10-CM | POA: Diagnosis present

## 2021-03-24 DIAGNOSIS — R609 Edema, unspecified: Secondary | ICD-10-CM | POA: Diagnosis not present

## 2021-03-24 DIAGNOSIS — E440 Moderate protein-calorie malnutrition: Secondary | ICD-10-CM | POA: Diagnosis present

## 2021-03-24 DIAGNOSIS — Z09 Encounter for follow-up examination after completed treatment for conditions other than malignant neoplasm: Secondary | ICD-10-CM

## 2021-03-24 DIAGNOSIS — I2510 Atherosclerotic heart disease of native coronary artery without angina pectoris: Secondary | ICD-10-CM | POA: Diagnosis present

## 2021-03-24 DIAGNOSIS — D689 Coagulation defect, unspecified: Secondary | ICD-10-CM | POA: Diagnosis not present

## 2021-03-24 DIAGNOSIS — D62 Acute posthemorrhagic anemia: Secondary | ICD-10-CM | POA: Diagnosis not present

## 2021-03-24 DIAGNOSIS — E785 Hyperlipidemia, unspecified: Secondary | ICD-10-CM | POA: Diagnosis present

## 2021-03-24 DIAGNOSIS — D631 Anemia in chronic kidney disease: Secondary | ICD-10-CM | POA: Diagnosis present

## 2021-03-24 DIAGNOSIS — E875 Hyperkalemia: Secondary | ICD-10-CM | POA: Diagnosis present

## 2021-03-24 DIAGNOSIS — Z803 Family history of malignant neoplasm of breast: Secondary | ICD-10-CM

## 2021-03-24 DIAGNOSIS — K56609 Unspecified intestinal obstruction, unspecified as to partial versus complete obstruction: Secondary | ICD-10-CM

## 2021-03-24 DIAGNOSIS — I440 Atrioventricular block, first degree: Secondary | ICD-10-CM | POA: Diagnosis not present

## 2021-03-24 DIAGNOSIS — I4819 Other persistent atrial fibrillation: Secondary | ICD-10-CM | POA: Diagnosis not present

## 2021-03-24 DIAGNOSIS — I471 Supraventricular tachycardia: Secondary | ICD-10-CM | POA: Diagnosis not present

## 2021-03-24 DIAGNOSIS — I3139 Other pericardial effusion (noninflammatory): Secondary | ICD-10-CM | POA: Diagnosis present

## 2021-03-24 DIAGNOSIS — Z823 Family history of stroke: Secondary | ICD-10-CM

## 2021-03-24 DIAGNOSIS — Z4659 Encounter for fitting and adjustment of other gastrointestinal appliance and device: Secondary | ICD-10-CM

## 2021-03-24 DIAGNOSIS — I252 Old myocardial infarction: Secondary | ICD-10-CM

## 2021-03-24 HISTORY — PX: 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: SHX6323

## 2021-03-24 HISTORY — PX: INTRAOPERATIVE TRANSESOPHAGEAL ECHOCARDIOGRAM: SHX5062

## 2021-03-24 LAB — POCT I-STAT, CHEM 8
BUN: 27 mg/dL — ABNORMAL HIGH (ref 6–20)
BUN: 25 mg/dL — ABNORMAL HIGH (ref 6–20)
BUN: 25 mg/dL — ABNORMAL HIGH (ref 6–20)
BUN: 26 mg/dL — ABNORMAL HIGH (ref 6–20)
BUN: 24 mg/dL — ABNORMAL HIGH (ref 6–20)
BUN: 28 mg/dL — ABNORMAL HIGH (ref 6–20)
Calcium, Ion: 1.19 mmol/L (ref 1.15–1.40)
Calcium, Ion: 1.14 mmol/L — ABNORMAL LOW (ref 1.15–1.40)
Calcium, Ion: 1.41 mmol/L — ABNORMAL HIGH (ref 1.15–1.40)
Calcium, Ion: 1.46 mmol/L — ABNORMAL HIGH (ref 1.15–1.40)
Calcium, Ion: 1.12 mmol/L — ABNORMAL LOW (ref 1.15–1.40)
Calcium, Ion: 1.12 mmol/L — ABNORMAL LOW (ref 1.15–1.40)
Chloride: 105 mmol/L (ref 98–111)
Chloride: 106 mmol/L (ref 98–111)
Chloride: 107 mmol/L (ref 98–111)
Chloride: 107 mmol/L (ref 98–111)
Chloride: 102 mmol/L (ref 98–111)
Chloride: 104 mmol/L (ref 98–111)
Creatinine, Ser: 2.3 mg/dL — ABNORMAL HIGH (ref 0.44–1.00)
Creatinine, Ser: 2.3 mg/dL — ABNORMAL HIGH (ref 0.44–1.00)
Creatinine, Ser: 2.3 mg/dL — ABNORMAL HIGH (ref 0.44–1.00)
Creatinine, Ser: 2.4 mg/dL — ABNORMAL HIGH (ref 0.44–1.00)
Creatinine, Ser: 1.9 mg/dL — ABNORMAL HIGH (ref 0.44–1.00)
Creatinine, Ser: 2.1 mg/dL — ABNORMAL HIGH (ref 0.44–1.00)
Glucose, Bld: 134 mg/dL — ABNORMAL HIGH (ref 70–99)
Glucose, Bld: 119 mg/dL — ABNORMAL HIGH (ref 70–99)
Glucose, Bld: 131 mg/dL — ABNORMAL HIGH (ref 70–99)
Glucose, Bld: 211 mg/dL — ABNORMAL HIGH (ref 70–99)
Glucose, Bld: 84 mg/dL (ref 70–99)
Glucose, Bld: 102 mg/dL — ABNORMAL HIGH (ref 70–99)
HCT: 35 % — ABNORMAL LOW (ref 36.0–46.0)
HCT: 31 % — ABNORMAL LOW (ref 36.0–46.0)
HCT: 31 % — ABNORMAL LOW (ref 36.0–46.0)
HCT: 34 % — ABNORMAL LOW (ref 36.0–46.0)
HCT: 24 % — ABNORMAL LOW (ref 36.0–46.0)
HCT: 22 % — ABNORMAL LOW (ref 36.0–46.0)
Hemoglobin: 11.9 g/dL — ABNORMAL LOW (ref 12.0–15.0)
Hemoglobin: 10.5 g/dL — ABNORMAL LOW (ref 12.0–15.0)
Hemoglobin: 10.5 g/dL — ABNORMAL LOW (ref 12.0–15.0)
Hemoglobin: 11.6 g/dL — ABNORMAL LOW (ref 12.0–15.0)
Hemoglobin: 8.2 g/dL — ABNORMAL LOW (ref 12.0–15.0)
Hemoglobin: 7.5 g/dL — ABNORMAL LOW (ref 12.0–15.0)
Potassium: 6.2 mmol/L — ABNORMAL HIGH (ref 3.5–5.1)
Potassium: 5.5 mmol/L — ABNORMAL HIGH (ref 3.5–5.1)
Potassium: 5.8 mmol/L — ABNORMAL HIGH (ref 3.5–5.1)
Potassium: 6.1 mmol/L — ABNORMAL HIGH (ref 3.5–5.1)
Potassium: 5.1 mmol/L (ref 3.5–5.1)
Potassium: 6.1 mmol/L — ABNORMAL HIGH (ref 3.5–5.1)
Sodium: 134 mmol/L — ABNORMAL LOW (ref 135–145)
Sodium: 133 mmol/L — ABNORMAL LOW (ref 135–145)
Sodium: 135 mmol/L (ref 135–145)
Sodium: 136 mmol/L (ref 135–145)
Sodium: 132 mmol/L — ABNORMAL LOW (ref 135–145)
Sodium: 129 mmol/L — ABNORMAL LOW (ref 135–145)
TCO2: 24 mmol/L (ref 22–32)
TCO2: 20 mmol/L — ABNORMAL LOW (ref 22–32)
TCO2: 22 mmol/L (ref 22–32)
TCO2: 22 mmol/L (ref 22–32)
TCO2: 24 mmol/L (ref 22–32)
TCO2: 24 mmol/L (ref 22–32)

## 2021-03-24 LAB — POCT I-STAT EG7
Acid-base deficit: 5 mmol/L — ABNORMAL HIGH (ref 0.0–2.0)
Bicarbonate: 21.7 mmol/L (ref 20.0–28.0)
Calcium, Ion: 1.23 mmol/L (ref 1.15–1.40)
HCT: 25 % — ABNORMAL LOW (ref 36.0–46.0)
Hemoglobin: 8.5 g/dL — ABNORMAL LOW (ref 12.0–15.0)
O2 Saturation: 88 %
Potassium: 5.2 mmol/L — ABNORMAL HIGH (ref 3.5–5.1)
Sodium: 137 mmol/L (ref 135–145)
TCO2: 23 mmol/L (ref 22–32)
pCO2, Ven: 47.6 mmHg (ref 44.0–60.0)
pH, Ven: 7.267 (ref 7.250–7.430)
pO2, Ven: 62 mmHg — ABNORMAL HIGH (ref 32.0–45.0)

## 2021-03-24 LAB — POCT I-STAT 7, (LYTES, BLD GAS, ICA,H+H)
Acid-base deficit: 4 mmol/L — ABNORMAL HIGH (ref 0.0–2.0)
Acid-base deficit: 2 mmol/L (ref 0.0–2.0)
Bicarbonate: 22.7 mmol/L (ref 20.0–28.0)
Bicarbonate: 23.6 mmol/L (ref 20.0–28.0)
Calcium, Ion: 1.16 mmol/L (ref 1.15–1.40)
Calcium, Ion: 1.13 mmol/L — ABNORMAL LOW (ref 1.15–1.40)
HCT: 24 % — ABNORMAL LOW (ref 36.0–46.0)
HCT: 22 % — ABNORMAL LOW (ref 36.0–46.0)
Hemoglobin: 8.2 g/dL — ABNORMAL LOW (ref 12.0–15.0)
Hemoglobin: 7.5 g/dL — ABNORMAL LOW (ref 12.0–15.0)
O2 Saturation: 100 %
O2 Saturation: 100 %
Potassium: 5.9 mmol/L — ABNORMAL HIGH (ref 3.5–5.1)
Potassium: 6.1 mmol/L — ABNORMAL HIGH (ref 3.5–5.1)
Sodium: 135 mmol/L (ref 135–145)
Sodium: 131 mmol/L — ABNORMAL LOW (ref 135–145)
TCO2: 24 mmol/L (ref 22–32)
TCO2: 25 mmol/L (ref 22–32)
pCO2 arterial: 49.2 mmHg — ABNORMAL HIGH (ref 32.0–48.0)
pCO2 arterial: 46.5 mmHg (ref 32.0–48.0)
pH, Arterial: 7.272 — ABNORMAL LOW (ref 7.350–7.450)
pH, Arterial: 7.314 — ABNORMAL LOW (ref 7.350–7.450)
pO2, Arterial: 420 mmHg — ABNORMAL HIGH (ref 83.0–108.0)
pO2, Arterial: 383 mmHg — ABNORMAL HIGH (ref 83.0–108.0)

## 2021-03-24 LAB — TROPONIN I (HIGH SENSITIVITY)
Troponin I (High Sensitivity): 64 ng/L — ABNORMAL HIGH (ref <18)
Troponin I (High Sensitivity): 70 ng/L — ABNORMAL HIGH (ref <18)

## 2021-03-24 LAB — CBC WITH DIFFERENTIAL/PLATELET
Abs Immature Granulocytes: 0.02 10*3/uL (ref 0.00–0.07)
Basophils Absolute: 0 10*3/uL (ref 0.0–0.1)
Basophils Relative: 0 %
Eosinophils Absolute: 0.2 10*3/uL (ref 0.0–0.5)
Eosinophils Relative: 2 %
HCT: 40.3 % (ref 36.0–46.0)
Hemoglobin: 13 g/dL (ref 12.0–15.0)
Immature Granulocytes: 0 %
Lymphocytes Relative: 18 %
Lymphs Abs: 1.7 10*3/uL (ref 0.7–4.0)
MCH: 34.1 pg — ABNORMAL HIGH (ref 26.0–34.0)
MCHC: 32.3 g/dL (ref 30.0–36.0)
MCV: 105.8 fL — ABNORMAL HIGH (ref 80.0–100.0)
Monocytes Absolute: 0.8 10*3/uL (ref 0.1–1.0)
Monocytes Relative: 9 %
Neutro Abs: 6.2 10*3/uL (ref 1.7–7.7)
Neutrophils Relative %: 71 %
Platelets: 198 10*3/uL (ref 150–400)
RBC: 3.81 MIL/uL — ABNORMAL LOW (ref 3.87–5.11)
RDW: 15.5 % (ref 11.5–15.5)
WBC: 9 10*3/uL (ref 4.0–10.5)
nRBC: 0 % (ref 0.0–0.2)

## 2021-03-24 LAB — BASIC METABOLIC PANEL
Anion gap: 10 (ref 5–15)
BUN: 23 mg/dL — ABNORMAL HIGH (ref 6–20)
CO2: 23 mmol/L (ref 22–32)
Calcium: 8.8 mg/dL — ABNORMAL LOW (ref 8.9–10.3)
Chloride: 103 mmol/L (ref 98–111)
Creatinine, Ser: 1.99 mg/dL — ABNORMAL HIGH (ref 0.44–1.00)
GFR, Estimated: 28 mL/min — ABNORMAL LOW (ref >60)
Glucose, Bld: 143 mg/dL — ABNORMAL HIGH (ref 70–99)
Potassium: 4.7 mmol/L (ref 3.5–5.1)
Sodium: 136 mmol/L (ref 135–145)

## 2021-03-24 LAB — BRAIN NATRIURETIC PEPTIDE: B Natriuretic Peptide: 978 pg/mL — ABNORMAL HIGH (ref 0.0–100.0)

## 2021-03-24 LAB — RESP PANEL BY RT-PCR (FLU A&B, COVID) ARPGX2
Influenza A by PCR: NEGATIVE
Influenza B by PCR: NEGATIVE
SARS Coronavirus 2 by RT PCR: NEGATIVE

## 2021-03-24 LAB — PROTIME-INR
INR: 1.1 (ref 0.8–1.2)
Prothrombin Time: 13.7 seconds (ref 11.4–15.2)

## 2021-03-24 LAB — PREPARE RBC (CROSSMATCH)

## 2021-03-24 LAB — TYPE AND SCREEN
ABO/RH(D): A POS
Antibody Screen: NEGATIVE

## 2021-03-24 SURGERY — REPAIR, AORTIC DISSECTION, ASCENDING
Anesthesia: General | Site: Chest

## 2021-03-24 MED ORDER — ALBUTEROL SULFATE HFA 108 (90 BASE) MCG/ACT IN AERS
INHALATION_SPRAY | RESPIRATORY_TRACT | Status: DC | PRN
  Administered 2021-03-24: 4 via RESPIRATORY_TRACT
  Administered 2021-03-25: 2 via RESPIRATORY_TRACT

## 2021-03-24 MED ORDER — PHENYLEPHRINE 40 MCG/ML (10ML) SYRINGE FOR IV PUSH (FOR BLOOD PRESSURE SUPPORT)
PREFILLED_SYRINGE | INTRAVENOUS | Status: AC
  Filled 2021-03-24: qty 20

## 2021-03-24 MED ORDER — ARTIFICIAL TEARS OPHTHALMIC OINT
TOPICAL_OINTMENT | OPHTHALMIC | Status: AC
  Filled 2021-03-24: qty 3.5

## 2021-03-24 MED ORDER — PLASMA-LYTE A IV SOLN
INTRAVENOUS | Status: DC | PRN
  Administered 2021-03-24: 19:00:00 1000 mL via INTRAVASCULAR

## 2021-03-24 MED ORDER — PHENYLEPHRINE 40 MCG/ML (10ML) SYRINGE FOR IV PUSH (FOR BLOOD PRESSURE SUPPORT)
PREFILLED_SYRINGE | INTRAVENOUS | Status: DC | PRN
  Administered 2021-03-24 (×2): 40 ug via INTRAVENOUS

## 2021-03-24 MED ORDER — LACTATED RINGERS IV SOLN: INTRAVENOUS | Status: DC | PRN

## 2021-03-24 MED ORDER — DEXTROSE 50 % IV SOLN
INTRAVENOUS | Status: DC | PRN
  Administered 2021-03-24 – 2021-03-25 (×2): 12.5 g via INTRAVENOUS

## 2021-03-24 MED ORDER — MANNITOL 20 % IV SOLN
INTRAVENOUS | Status: DC
  Filled 2021-03-24: qty 13

## 2021-03-24 MED ORDER — PHENYLEPHRINE HCL-NACL 20-0.9 MG/250ML-% IV SOLN
30.0000 ug/min | INTRAVENOUS | Status: AC
  Administered 2021-03-24: 21:00:00 10 ug/min via INTRAVENOUS
  Filled 2021-03-24: qty 250

## 2021-03-24 MED ORDER — PLASMA-LYTE A IV SOLN
INTRAVENOUS | Status: DC
  Filled 2021-03-24: qty 5

## 2021-03-24 MED ORDER — FENTANYL CITRATE (PF) 250 MCG/5ML IJ SOLN
INTRAMUSCULAR | Status: DC | PRN
  Administered 2021-03-24: 150 ug via INTRAVENOUS
  Administered 2021-03-24: 100 ug via INTRAVENOUS
  Administered 2021-03-24: 50 ug via INTRAVENOUS
  Administered 2021-03-24 (×3): 100 ug via INTRAVENOUS
  Administered 2021-03-24: 250 ug via INTRAVENOUS
  Administered 2021-03-24 (×2): 100 ug via INTRAVENOUS
  Administered 2021-03-24: 200 ug via INTRAVENOUS
  Administered 2021-03-25 (×3): 50 ug via INTRAVENOUS

## 2021-03-24 MED ORDER — ROCURONIUM BROMIDE 10 MG/ML (PF) SYRINGE
PREFILLED_SYRINGE | INTRAVENOUS | Status: AC
  Filled 2021-03-24: qty 30

## 2021-03-24 MED ORDER — HEPARIN SODIUM (PORCINE) 1000 UNIT/ML IJ SOLN
INTRAMUSCULAR | Status: AC
  Filled 2021-03-24: qty 1

## 2021-03-24 MED ORDER — MIDAZOLAM HCL (PF) 5 MG/ML IJ SOLN
INTRAMUSCULAR | Status: DC | PRN
  Administered 2021-03-24: 2 mg via INTRAVENOUS
  Administered 2021-03-24: 3 mg via INTRAVENOUS
  Administered 2021-03-25: 2 mg via INTRAVENOUS
  Administered 2021-03-25: 3 mg via INTRAVENOUS

## 2021-03-24 MED ORDER — PROTAMINE SULFATE 10 MG/ML IV SOLN
INTRAVENOUS | Status: AC
  Filled 2021-03-24: qty 25

## 2021-03-24 MED ORDER — ACETAMINOPHEN 325 MG PO TABS
650.0000 mg | ORAL_TABLET | Freq: Once | ORAL | Status: AC
  Administered 2021-03-24: 13:00:00 650 mg via ORAL
  Filled 2021-03-24: qty 2

## 2021-03-24 MED ORDER — TRANEXAMIC ACID (OHS) BOLUS VIA INFUSION
15.0000 mg/kg | INTRAVENOUS | Status: AC
  Administered 2021-03-24: 20:00:00 1191 mg via INTRAVENOUS
  Filled 2021-03-24: qty 1191

## 2021-03-24 MED ORDER — METHYLPREDNISOLONE SODIUM SUCC 125 MG IJ SOLR
125.0000 mg | INTRAMUSCULAR | Status: AC
  Administered 2021-03-24: 22:00:00 125 mg via INTRAVENOUS
  Filled 2021-03-24: qty 2

## 2021-03-24 MED ORDER — 0.9 % SODIUM CHLORIDE (POUR BTL) OPTIME
TOPICAL | Status: DC | PRN
  Administered 2021-03-24: 19:00:00 5000 mL

## 2021-03-24 MED ORDER — HEPARIN SODIUM (PORCINE) 1000 UNIT/ML IJ SOLN
INTRAMUSCULAR | Status: DC | PRN
  Administered 2021-03-24: 24000 [IU] via INTRAVENOUS

## 2021-03-24 MED ORDER — NOREPINEPHRINE 4 MG/250ML-% IV SOLN
0.0000 ug/min | INTRAVENOUS | Status: DC
  Filled 2021-03-24: qty 250

## 2021-03-24 MED ORDER — MIDAZOLAM HCL (PF) 10 MG/2ML IJ SOLN
INTRAMUSCULAR | Status: AC
  Filled 2021-03-24: qty 2

## 2021-03-24 MED ORDER — CALCIUM CHLORIDE 10 % IV SOLN
INTRAVENOUS | Status: AC
  Filled 2021-03-24: qty 10

## 2021-03-24 MED ORDER — FENTANYL CITRATE (PF) 250 MCG/5ML IJ SOLN
INTRAMUSCULAR | Status: AC
  Filled 2021-03-24: qty 5

## 2021-03-24 MED ORDER — NITROGLYCERIN IN D5W 200-5 MCG/ML-% IV SOLN
2.0000 ug/min | INTRAVENOUS | Status: AC
  Administered 2021-03-24: 21:00:00 5 ug/min via INTRAVENOUS
  Filled 2021-03-24: qty 250

## 2021-03-24 MED ORDER — EPINEPHRINE HCL 5 MG/250ML IV SOLN IN NS
0.0000 ug/min | INTRAVENOUS | Status: DC
  Filled 2021-03-24: qty 250

## 2021-03-24 MED ORDER — LIDOCAINE HCL (CARDIAC) PF 100 MG/5ML IV SOSY
PREFILLED_SYRINGE | INTRAVENOUS | Status: DC | PRN
  Administered 2021-03-24: 100 mg via INTRATRACHEAL

## 2021-03-24 MED ORDER — SODIUM CHLORIDE (PF) 0.9 % IJ SOLN
INTRAMUSCULAR | Status: AC
  Filled 2021-03-24: qty 20

## 2021-03-24 MED ORDER — CEFAZOLIN SODIUM-DEXTROSE 2-4 GM/100ML-% IV SOLN
2.0000 g | INTRAVENOUS | Status: AC
  Administered 2021-03-24 – 2021-03-25 (×2): 2 g via INTRAVENOUS
  Filled 2021-03-24: qty 100

## 2021-03-24 MED ORDER — ALBUTEROL SULFATE HFA 108 (90 BASE) MCG/ACT IN AERS
INHALATION_SPRAY | RESPIRATORY_TRACT | Status: AC
  Filled 2021-03-24: qty 6.7

## 2021-03-24 MED ORDER — THROMBIN 20000 UNITS EX SOLR
OROMUCOSAL | Status: DC | PRN
  Administered 2021-03-24 (×3): 4 mL via TOPICAL

## 2021-03-24 MED ORDER — POTASSIUM CHLORIDE 2 MEQ/ML IV SOLN
80.0000 meq | INTRAVENOUS | Status: DC
  Filled 2021-03-24: qty 40

## 2021-03-24 MED ORDER — CALCIUM CHLORIDE 10 % IV SOLN
INTRAVENOUS | Status: DC | PRN
  Administered 2021-03-24: 1 g via INTRAVENOUS

## 2021-03-24 MED ORDER — LIDOCAINE 2% (20 MG/ML) 5 ML SYRINGE
INTRAMUSCULAR | Status: AC
  Filled 2021-03-24: qty 5

## 2021-03-24 MED ORDER — THROMBIN (RECOMBINANT) 20000 UNITS EX SOLR
CUTANEOUS | Status: AC
  Filled 2021-03-24: qty 20000

## 2021-03-24 MED ORDER — DEXTROSE 50 % IV SOLN
INTRAVENOUS | Status: AC
  Filled 2021-03-24: qty 50

## 2021-03-24 MED ORDER — HEMOSTATIC AGENTS (NO CHARGE) OPTIME
TOPICAL | Status: DC | PRN
  Administered 2021-03-24 – 2021-03-25 (×3): 1 via TOPICAL

## 2021-03-24 MED ORDER — THROMBIN 20000 UNITS EX SOLR
CUTANEOUS | Status: DC | PRN
  Administered 2021-03-24: 20000 [IU] via TOPICAL

## 2021-03-24 MED ORDER — TRANEXAMIC ACID 1000 MG/10ML IV SOLN
1.5000 mg/kg/h | INTRAVENOUS | Status: AC
  Administered 2021-03-24: 21:00:00 1.5 mg/kg/h via INTRAVENOUS
  Filled 2021-03-24: qty 25

## 2021-03-24 MED ORDER — INSULIN REGULAR(HUMAN) IN NACL 100-0.9 UT/100ML-% IV SOLN
INTRAVENOUS | Status: AC
  Administered 2021-03-24: 21:00:00 1 [IU]/h via INTRAVENOUS
  Filled 2021-03-24: qty 100

## 2021-03-24 MED ORDER — ESMOLOL HCL-SODIUM CHLORIDE 2000 MG/100ML IV SOLN
25.0000 ug/kg/min | INTRAVENOUS | Status: DC
  Administered 2021-03-24: 15:00:00 25 ug/kg/min via INTRAVENOUS
  Filled 2021-03-24 (×2): qty 100

## 2021-03-24 MED ORDER — PROPOFOL 10 MG/ML IV BOLUS
INTRAVENOUS | Status: DC | PRN
  Administered 2021-03-24: 50 mg via INTRAVENOUS
  Administered 2021-03-24 (×2): 20 mg via INTRAVENOUS
  Administered 2021-03-24: 50 mg via INTRAVENOUS
  Administered 2021-03-24 (×3): 20 mg via INTRAVENOUS

## 2021-03-24 MED ORDER — ARTIFICIAL TEARS OPHTHALMIC OINT
TOPICAL_OINTMENT | OPHTHALMIC | Status: DC | PRN
  Administered 2021-03-24: 1 via OPHTHALMIC

## 2021-03-24 MED ORDER — VANCOMYCIN HCL 1250 MG/250ML IV SOLN
1250.0000 mg | INTRAVENOUS | Status: AC
  Administered 2021-03-24: 20:00:00 1250 mg via INTRAVENOUS
  Filled 2021-03-24: qty 250

## 2021-03-24 MED ORDER — PROPOFOL 10 MG/ML IV BOLUS
INTRAVENOUS | Status: AC
  Filled 2021-03-24: qty 20

## 2021-03-24 MED ORDER — TRANEXAMIC ACID (OHS) PUMP PRIME SOLUTION
2.0000 mg/kg | INTRAVENOUS | Status: DC
  Filled 2021-03-24: qty 1.59

## 2021-03-24 MED ORDER — DEXMEDETOMIDINE HCL IN NACL 400 MCG/100ML IV SOLN
0.1000 ug/kg/h | INTRAVENOUS | Status: AC
Start: 2021-03-24 — End: 2021-03-25
  Administered 2021-03-25: .5 ug/kg/h via INTRAVENOUS
  Filled 2021-03-24: qty 100

## 2021-03-24 MED ORDER — MILRINONE LACTATE IN DEXTROSE 20-5 MG/100ML-% IV SOLN
0.3000 ug/kg/min | INTRAVENOUS | Status: DC
  Filled 2021-03-24: qty 100

## 2021-03-24 MED ORDER — CEFAZOLIN SODIUM-DEXTROSE 2-4 GM/100ML-% IV SOLN
2.0000 g | INTRAVENOUS | Status: DC
  Filled 2021-03-24: qty 100

## 2021-03-24 MED ORDER — NITROGLYCERIN 0.2 MG/ML ON CALL CATH LAB
INTRAVENOUS | Status: DC | PRN
  Administered 2021-03-24 (×2): 20 ug via INTRAVENOUS

## 2021-03-24 MED ORDER — IOHEXOL 350 MG/ML SOLN
80.0000 mL | Freq: Once | INTRAVENOUS | Status: AC | PRN
  Administered 2021-03-24: 14:00:00 80 mL via INTRAVENOUS

## 2021-03-24 MED ORDER — ROCURONIUM BROMIDE 10 MG/ML (PF) SYRINGE
PREFILLED_SYRINGE | INTRAVENOUS | Status: DC | PRN
  Administered 2021-03-24 (×2): 100 mg via INTRAVENOUS
  Administered 2021-03-24 – 2021-03-25 (×4): 50 mg via INTRAVENOUS

## 2021-03-24 MED ORDER — HEPARIN 30,000 UNITS/1000 ML (OHS) CELLSAVER SOLUTION
Status: DC
  Filled 2021-03-24: qty 1000

## 2021-03-24 SURGICAL SUPPLY — 94 items
ADAPTER CARDIO PERF ANTE/RETRO (ADAPTER) ×3 IMPLANT
ADH SRG 12 PREFL SYR 3 SPRDR (MISCELLANEOUS) ×2
ADPR PRFSN 84XANTGRD RTRGD (ADAPTER) ×2
APL SRG 7X2 LUM MLBL SLNT (VASCULAR PRODUCTS) ×2
APPLICATOR TIP COSEAL (VASCULAR PRODUCTS) ×1 IMPLANT
ATTRACTOMAT 16X20 MAGNETIC DRP (DRAPES) ×3 IMPLANT
BAG DECANTER FOR FLEXI CONT (MISCELLANEOUS) ×3 IMPLANT
BLADE CLIPPER SURG (BLADE) ×3 IMPLANT
BLADE STERNUM SYSTEM 6 (BLADE) ×3 IMPLANT
BLADE SURG 15 STRL LF DISP TIS (BLADE) ×2 IMPLANT
BLADE SURG 15 STRL SS (BLADE)
CANISTER SUCT 3000ML PPV (MISCELLANEOUS) ×3 IMPLANT
CANNULA GUNDRY RCSP 15FR (MISCELLANEOUS) ×3 IMPLANT
CATH HEART VENT LEFT (CATHETERS) ×2 IMPLANT
CATH ROBINSON RED A/P 18FR (CATHETERS) ×9 IMPLANT
CATH THORACIC 36FR (CATHETERS) ×3 IMPLANT
CATH THORACIC 36FR RT ANG (CATHETERS) ×3 IMPLANT
CNTNR URN SCR LID CUP LEK RST (MISCELLANEOUS) ×2 IMPLANT
CONN ST 1/4X3/8  BEN (MISCELLANEOUS) ×6
CONN ST 1/4X3/8 BEN (MISCELLANEOUS) IMPLANT
CONT SPEC 4OZ STRL OR WHT (MISCELLANEOUS) ×3
CONTAINER PROTECT SURGISLUSH (MISCELLANEOUS) ×4 IMPLANT
COVER SURGICAL LIGHT HANDLE (MISCELLANEOUS) ×5 IMPLANT
DRAPE CARDIOVASCULAR INCISE (DRAPES) ×3
DRAPE SRG 135X102X78XABS (DRAPES) ×2 IMPLANT
DRAPE WARM FLUID 44X44 (DRAPES) IMPLANT
DRSG COVADERM 4X14 (GAUZE/BANDAGES/DRESSINGS) ×3 IMPLANT
DRSG COVADERM 4X8 (GAUZE/BANDAGES/DRESSINGS) ×1 IMPLANT
ELECT CAUTERY BLADE 6.4 (BLADE) ×3 IMPLANT
ELECT REM PT RETURN 9FT ADLT (ELECTROSURGICAL) ×6
ELECTRODE REM PT RTRN 9FT ADLT (ELECTROSURGICAL) ×4 IMPLANT
FELT TEFLON 1X6 (MISCELLANEOUS) ×2 IMPLANT
GAUZE SPONGE 4X4 12PLY STRL (GAUZE/BANDAGES/DRESSINGS) ×4 IMPLANT
GLOVE SURG ENC MOIS LTX SZ6 (GLOVE) IMPLANT
GLOVE SURG ENC MOIS LTX SZ6.5 (GLOVE) IMPLANT
GLOVE SURG ENC MOIS LTX SZ7 (GLOVE) IMPLANT
GLOVE SURG ENC MOIS LTX SZ7.5 (GLOVE) IMPLANT
GLOVE SURG MICRO LTX SZ7 (GLOVE) ×6 IMPLANT
GOWN STRL REUS W/ TWL LRG LVL3 (GOWN DISPOSABLE) ×8 IMPLANT
GOWN STRL REUS W/ TWL XL LVL3 (GOWN DISPOSABLE) ×2 IMPLANT
GOWN STRL REUS W/TWL LRG LVL3 (GOWN DISPOSABLE) ×12
GOWN STRL REUS W/TWL XL LVL3 (GOWN DISPOSABLE) ×3
GRAFT 4 BRANCH 30X50 (Prosthesis & Implant Heart) ×1 IMPLANT
GRAFT CV 30X8WVN NDL (Graft) IMPLANT
GRAFT HEMASHIELD 8MM (Graft) ×3 IMPLANT
HEMOSTAT POWDER SURGIFOAM 1G (HEMOSTASIS) ×9 IMPLANT
HEMOSTAT SURGICEL 2X14 (HEMOSTASIS) ×5 IMPLANT
INSERT FOGARTY SM (MISCELLANEOUS) ×3 IMPLANT
KIT BASIN OR (CUSTOM PROCEDURE TRAY) ×3 IMPLANT
KIT CATH CPB BARTLE (MISCELLANEOUS) ×3 IMPLANT
KIT SUCTION CATH 14FR (SUCTIONS) ×3 IMPLANT
KIT TURNOVER KIT B (KITS) ×3 IMPLANT
LINE VENT (MISCELLANEOUS) ×1 IMPLANT
NS IRRIG 1000ML POUR BTL (IV SOLUTION) ×15 IMPLANT
PACK E OPEN HEART (SUTURE) ×3 IMPLANT
PACK OPEN HEART (CUSTOM PROCEDURE TRAY) ×3 IMPLANT
PAD ARMBOARD 7.5X6 YLW CONV (MISCELLANEOUS) ×6 IMPLANT
POSITIONER HEAD DONUT 9IN (MISCELLANEOUS) ×3 IMPLANT
SEALANT SURG COSEAL 8ML (VASCULAR PRODUCTS) ×1 IMPLANT
SET MPS 3-ND DEL (MISCELLANEOUS) ×1 IMPLANT
SUT BONE WAX W31G (SUTURE) ×3 IMPLANT
SUT EB EXC GRN/WHT 2-0 V-5 (SUTURE) ×6 IMPLANT
SUT ETHIBON EXCEL 2-0 V-5 (SUTURE) IMPLANT
SUT ETHIBOND 2 0 SH (SUTURE) ×15
SUT ETHIBOND 2 0 SH 36X2 (SUTURE) IMPLANT
SUT ETHIBOND V-5 VALVE (SUTURE) IMPLANT
SUT PROLENE 3 0 SH 1 (SUTURE) ×3 IMPLANT
SUT PROLENE 3 0 SH 48 (SUTURE) ×3 IMPLANT
SUT PROLENE 3 0 SH DA (SUTURE) IMPLANT
SUT PROLENE 3 0 SH1 36 (SUTURE) ×2 IMPLANT
SUT PROLENE 4 0 RB 1 (SUTURE) ×42
SUT PROLENE 4-0 RB1 .5 CRCL 36 (SUTURE) ×8 IMPLANT
SUT PROLENE 5 0 C 1 36 (SUTURE) ×2 IMPLANT
SUT PROLENE 5 0 RB 2 (SUTURE) ×6 IMPLANT
SUT SILK 2 0 SH CR/8 (SUTURE) ×1 IMPLANT
SUT STEEL 6MS V (SUTURE) IMPLANT
SUT STEEL STERNAL CCS#1 18IN (SUTURE) IMPLANT
SUT STEEL SZ 6 DBL 3X14 BALL (SUTURE) IMPLANT
SUT VIC AB 1 CTX 36 (SUTURE) ×6
SUT VIC AB 1 CTX36XBRD ANBCTR (SUTURE) ×4 IMPLANT
SUT VIC AB 2-0 CT1 27 (SUTURE) ×3
SUT VIC AB 2-0 CT1 TAPERPNT 27 (SUTURE) IMPLANT
SUT VIC AB 3-0 SH 27 (SUTURE) ×3
SUT VIC AB 3-0 SH 27X BRD (SUTURE) IMPLANT
SUT VIC AB 3-0 X1 27 (SUTURE) ×1 IMPLANT
SYR 10ML KIT SKIN ADHESIVE (MISCELLANEOUS) ×1 IMPLANT
SYSTEM SAHARA CHEST DRAIN ATS (WOUND CARE) ×3 IMPLANT
TAPE PAPER 2X10 WHT MICROPORE (GAUZE/BANDAGES/DRESSINGS) ×1 IMPLANT
TOWEL GREEN STERILE (TOWEL DISPOSABLE) ×3 IMPLANT
TOWEL GREEN STERILE FF (TOWEL DISPOSABLE) ×3 IMPLANT
TRAY FOLEY SLVR 14FR TEMP STAT (SET/KITS/TRAYS/PACK) ×3 IMPLANT
UNDERPAD 30X36 HEAVY ABSORB (UNDERPADS AND DIAPERS) ×3 IMPLANT
VENT LEFT HEART 12002 (CATHETERS) ×3
WATER STERILE IRR 1000ML POUR (IV SOLUTION) ×6 IMPLANT

## 2021-03-24 NOTE — Anesthesia Procedure Notes (Signed)
Central Venous Catheter Insertion Performed by: Murvin Natal, MD, anesthesiologist Start/End12/18/2022 7:30 PM, 03/24/2021 7:50 PM Patient location: OR. Preanesthetic checklist: patient identified, IV checked, site marked, risks and benefits discussed, surgical consent, monitors and equipment checked, pre-op evaluation, timeout performed and anesthesia consent Position: Trendelenburg Hand hygiene performed , maximum sterile barriers used  and Seldinger technique used Catheter size: 8.5 Fr Total catheter length 10. PA cath was placed.Swan type:thermodilution PA Cath depth:42 Procedure performed using ultrasound guided technique. Ultrasound Notes:anatomy identified, needle tip was noted to be adjacent to the nerve/plexus identified and no ultrasound evidence of intravascular and/or intraneural injection Attempts: 1 Following insertion, line sutured. Post procedure assessment: blood return through all ports, free fluid flow and no air  Patient tolerated the procedure well with no immediate complications. Additional procedure comments: Unable to obtain pulmonary artery tracing with the first catheter. New PA catheter kit obtained with successful placement. Marland Kitchen

## 2021-03-24 NOTE — ED Notes (Signed)
Lab called to confirm the need for type and screen, confirmed with Latitia in [facility] that type and screen is needed.

## 2021-03-24 NOTE — Anesthesia Procedure Notes (Signed)
Arterial Line Insertion Start/End12/18/2022 7:15 PM, 03/24/2021 7:20 PM Performed by: Suzy Bouchard, CRNA, CRNA  Patient location: OR. Preanesthetic checklist: patient identified, IV checked, risks and benefits discussed, surgical consent, monitors and equipment checked, pre-op evaluation and timeout performed Emergency situation Lidocaine 1% used for infiltration and patient sedated radial was placed Catheter size: 20 G Hand hygiene performed , maximum sterile barriers used  and Seldinger technique used Allen's test indicative of satisfactory collateral circulation Attempts: 1 Procedure performed without using ultrasound guided technique. Following insertion, dressing applied and Biopatch. Post procedure assessment: normal  Patient tolerated the procedure well with no immediate complications.

## 2021-03-24 NOTE — Anesthesia Preprocedure Evaluation (Addendum)
Anesthesia Evaluation  Patient identified by MRN, date of birth, ID band Patient awake    Reviewed: Allergy & Precautions, NPO status , Patient's Chart, lab work & pertinent test results  Airway Mallampati: III  TM Distance: >3 FB Neck ROM: Full    Dental  (+) Loose, Missing,    Pulmonary Current Smoker,    Pulmonary exam normal        Cardiovascular hypertension, Pt. on medications + CAD and + Past MI  Normal cardiovascular exam     Neuro/Psych    GI/Hepatic (+)     substance abuse  ,   Endo/Other    Renal/GU CRFRenal disease     Musculoskeletal   Abdominal   Peds  Hematology   Anesthesia Other Findings aortic dissection  Reproductive/Obstetrics                            Anesthesia Physical Anesthesia Plan  ASA: 5 and emergent  Anesthesia Plan: General   Post-op Pain Management:    Induction: Intravenous  PONV Risk Score and Plan: 2 and Ondansetron, Dexamethasone, Midazolam and Treatment may vary due to age or medical condition  Airway Management Planned: Oral ETT  Additional Equipment: Arterial line, CVP, PA Cath, TEE and Ultrasound Guidance Line Placement  Intra-op Plan: Utilization Of Total Body Hypothermia per surgeon request and Delibrate Circulatory arrest per surgeon request  Post-operative Plan: Post-operative intubation/ventilation  Informed Consent:     Only emergency history available  Plan Discussed with: Surgeon  Anesthesia Plan Comments: (Bilateral arterial lines )      Anesthesia Quick Evaluation

## 2021-03-24 NOTE — ED Notes (Signed)
Called [facility] to page CT surgery.

## 2021-03-24 NOTE — ED Notes (Signed)
Arrived via [facility]. Placed on cardiac monitor. Brevibloc gtt infusing from PTA at 70mcg. Alert and Oriented X 4. No complaints.

## 2021-03-24 NOTE — Hospital Course (Addendum)
History of Present Illness:  Patient is a 60 yo female with history of CAD, NSTEMI, tobacco abuse, and Type B Aortic Dissection.  She was initially found to have her dissection back in 2019.  She did not follow up and has not been monitored since that time.  She has also been non-compliant with her blood pressure medications. She presented to the [facility] ED with complaints of substernal chest tightness with associated shortness of breath, that occurred while the patient was cleaning her house.  She sat down and took NTG which helped relieved her pain.  She denied diaphoresis, nausea, dizziness, headache.  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  She underwent repeat CT scan with IV contrast which confirmed a Type A aortic dissection from aortic root with extension into the aortic arch meeting up with previous Type B dissection.  There was also evidence of a pericardial effusion. The patient was transferred to [facility] for further evaluation by CT surgery.  Hospital Course:  The patient was evaluated by Dr. Cyndia Bent at which time patient remained hypertensive despite esmolol drip.  He noted her to have Stage III CKD with elevated creatinine of 1.99.  She also admitted to continued smoking.  He felt the patient would require emergent surgical intervention.  The risks and benefits of the procedure were explained to the patient and she was agreeable to proceed.  She was taken to the operating room and underwent Sternotomy with repair and replacement of ascending aorta and aortic arch under hypothermic circulatory arrest.  She tolerated the procedure and was taken to the SICU in critical but stable condition. We attempted extubation the evening of 12/19;however, patient failed attempt and was placed back on full support. She was weaned off Nitro drip. She was successfully extubated the morning of 12/20. She was  restarted on Amlodipine. She was weaned off the Insulin drip. Her pre op HGA1C was 5.9. She likely has pre diabetes. Nutrition information will be provided with her paperwork and she will need further surveillance by her medical doctor after discharge. She was requiring several liters of oxygen, initially via [place] then HFNC, for several days post op. We will wean her back to [place] then hopefully room air by discharge. She has a history of CKD. Post op her creatinine went as high as 2.5. Gradually, her creatinine decreased. PT and OT were ordered to assist with ambulation. SNF was recommended. BP was under good control with Amlodipine 10 mg daily and Lopressor 25. Mg bid. She was still on a Cardene drip as well.  This was weaned off as BP improved.  She was started on Lisinopril by CCM, however due to an elevation in her creatinine level to 2.64 this was discontinued.  General surgery was consulted for her large ventral hernia.  This was incarcerated, but not felt to require surgery at this time.  The patient was made NPO and monitored closely.  KUB was obtained and showed evidence of Ileus.  She was stable for transfer to the progressive care unit on 05/01/2020.       She has not had a bowel movement, despite laxative and enema. Abdominal x ray showed increased gaseous distention of the right colon (ileus or colonic obstruction) within the known ventral hernia. CT of abdomen/pelvis later on 12/27 showed a large amount of free intraperitoneal air within the upper abdomen, with extraluminal air within the hernia sac.  There is now a dilatation of the ascending colon and cecum (proximal to the hernia site) with associated air-fluid levels. Dr. Bobbye Morton discussed with patient and her sister the need for emergent ex lap, possible bowel resection, possible ostomy, any other necessary and indicated procedures. Patient was taken to OR and underwent exploratory laparotomy, adhesiolysis, extended right hemi colectomy, primary  incisional hernia repair, ileostomy, abdominal washout, drain placement x 2, and wound VAC. Patient remained intubated and sedated for post op day one. Creatinine increased to 2.72. She was extubated the morning of post op day 2. She was up to a rate of 70 ml/hr with TPN. She went into a fib with RVR on 12/29. She was put on an Amiodarone drip and given boluses.  NG tube was removed on post op day 3. She remained in a fib with RVR so she was also given IV Lopressor.

## 2021-03-24 NOTE — ED Triage Notes (Signed)
Pt BIB EMS from home c/o chest pain started today while cleaning up the house. H/O Heart attack, HTN, smoker. Denies SOB, no dizziness/headache/nausea. 4 ASA, 2 Nitro given. Pt now reports 1/10.  VS per ems: 166/90 NSR= 80 96% RA CBG 121

## 2021-03-24 NOTE — Anesthesia Procedure Notes (Signed)
Arterial Line Insertion Start/End12/18/2022 7:20 PM, 03/24/2021 7:30 PM Performed by: Murvin Natal, MD, anesthesiologist  Patient location: OR. Preanesthetic checklist: patient identified, IV checked, site marked, risks and benefits discussed, surgical consent, monitors and equipment checked, pre-op evaluation, timeout performed and anesthesia consent Lidocaine 1% used for infiltration and patient sedated Right, radial was placed Catheter size: 20 G Hand hygiene performed , maximum sterile barriers used  and Seldinger technique used  Attempts: 1 Procedure performed using ultrasound guided technique. Ultrasound Notes:anatomy identified, needle tip was noted to be adjacent to the nerve/plexus identified and no ultrasound evidence of intravascular and/or intraneural injection Following insertion, dressing applied and Biopatch. Post procedure assessment: normal and unchanged  Patient tolerated the procedure well with no immediate complications.

## 2021-03-24 NOTE — ED Notes (Signed)
Vascular surgery sent to Dr. Melina Copa.

## 2021-03-24 NOTE — ED Notes (Signed)
Report given to [facility] charge nurse at [facility] ED.

## 2021-03-24 NOTE — Anesthesia Procedure Notes (Signed)
Procedure Name: Intubation Date/Time: 03/24/2021 7:31 PM Performed by: Wilburn Cornelia, CRNA Pre-anesthesia Checklist: Patient identified, Emergency Drugs available, Suction available, Patient being monitored and Timeout performed Patient Re-evaluated:Patient Re-evaluated prior to induction Oxygen Delivery Method: Circle system utilized Preoxygenation: Pre-oxygenation with 100% oxygen Induction Type: IV induction Ventilation: Mask ventilation without difficulty Laryngoscope Size: Mac and 3 Grade View: Grade I Tube type: Oral Tube size: 8.0 mm Number of attempts: 1 Airway Equipment and Method: Stylet Placement Confirmation: ETT inserted through vocal cords under direct vision, positive ETCO2, CO2 detector and breath sounds checked- equal and bilateral Secured at: 21 cm Tube secured with: Tape Dental Injury: Teeth and Oropharynx as per pre-operative assessment

## 2021-03-24 NOTE — ED Provider Notes (Signed)
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  Patient updated on plan and is in agreement. [MB]    Clinical Course User Index [MB] Hayden Rasmussen, MD   MDM Rules/Calculators/A&P                         This patient complains of chest tightness and shortness of breath in the setting of minimal exertion; this involves an extensive number of treatment Options and is a complaint that carries with it a high risk of complications and Morbidity. The differential includes ACS vascular PE, pneumothorax, tamponade, reflux, musculoskeletal  I ordered, reviewed and interpreted labs, which included CBC with normal white count normal hemoglobin, chemistries with elevated glucose elevated BUN/creatinine worse from priors, troponins elevated to be trended, BNP elevated, COVID and flu negative, INR normal I ordered medication Tylenol for headache, esmolol for elevated blood pressure I ordered imaging studies which included chest x-ray CT noncontrast chest, CT angio chest abdomen pelvis and I independently    visualized and interpreted imaging which showed 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 Caffie Pinto cardiothoracic surgery and discussed lab and imaging findings  Critical Interventions:  Work-up and management of patient's chest pain found to have aortic dissection started on esmolol for elevated blood pressure  After the interventions stated above, I reevaluated the patient and found patient's blood pressure still to be high.  [facility] is here to transport her to [facility] ED for evaluation by CT surgery.     Final Clinical Impression(s) / ED Diagnoses Final diagnoses:  Dissection of thoracic aorta, unspecified part    Rx / DC Orders ED Discharge Orders     None        Hayden Rasmussen, MD 03/24/21 [phone]

## 2021-03-24 NOTE — Consult Note (Signed)
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 swelling.  Skin:    General: Skin is warm and dry.  Neurological:     General: No focal deficit present.     Mental Status: She is alert and oriented to person, place, and time.  Psychiatric:        Mood and Affect: Mood normal.        Behavior: Behavior normal.   Narrative & Impression  CLINICAL DATA:  Patient with history of type B descending thoracic aortic aneurysm, now with concern for intramural hematoma on noncontrast chest CT performed earlier today.   EXAM: CT ANGIOGRAPHY CHEST, ABDOMEN AND PELVIS   TECHNIQUE: Non-contrast CT of the chest was initially obtained.   Multidetector CT imaging through the chest, abdomen and pelvis was performed using the standard protocol during bolus administration of intravenous contrast. Multiplanar reconstructed images and MIPs were obtained and reviewed to evaluate the vascular anatomy.   CONTRAST:  88mL OMNIPAQUE IOHEXOL 350 MG/ML SOLN   COMPARISON:  Noncontrast  chest CT-earlier same day   Chest CT-10/07/2017   FINDINGS: CTA CHEST FINDINGS   Vascular Findings:   Interval development of a short-segment type A thoracic aortic dissection beginning at  the aortic root and extending to the posterior aspect of the aortic arch, peripheral to the takeoff of the left subclavian artery. The branch vessels of the aortic arch appear to arise from the true lumen and are not affected by the short-segment dissection. Conventional configuration of the aortic arch.   High-density material is seen about the aortic root and within the pericardium sac compatible with hemorrhage though a discrete area of active extravasation is not identified.   Similar findings of known short-segment type B thoracic aortic dissection, though now with nonocclusive mural thrombus within the false lumen involving the proximal descending thoracic aorta (image 41, series 4).   The thoracic aorta is aneurysmal with measurements as follows.   Normal heart size.   Although this examination was not tailored for the evaluation the pulmonary arteries, there are no discrete filling defects within the central pulmonary arterial tree to suggest central pulmonary embolism. Normal caliber of the main pulmonary artery.   -------------------------------------------------------------   Thoracic aortic measurements:   SINOTUBULAR JUNCTION: 35 mm as measured in greatest oblique short axis coronal dimension.   PROXIMAL ASCENDING THORACIC AORTA: 40 mm as measured in greatest oblique short axis axial dimension at the level of the main pulmonary artery (axial image 49, series 4) and approximately 43 mm in greatest oblique short axis coronal diameter (coronal image 78, series 8)   AORTIC ARCH: 47 mm as measured in greatest oblique short axis sagittal dimension (sagittal image 130, series 9).   PROXIMAL DESCENDING THORACIC AORTA: 35 mm as measured in greatest oblique short axis axial  dimension at the level of the main pulmonary artery.   DISTAL DESCENDING THORACIC AORTA: 44 mm as measured in greatest oblique short axis axial dimension at the level of the diaphragmatic hiatus (axial image 91, series 4).   Review of the MIP images confirms the above findings.   -------------------------------------------------------------   Non-Vascular Findings:   Mediastinum/Lymph Nodes: No bulky mediastinal, hilar or axillary lymphadenopathy. As above, high-density material is seen about the aortic root and pericardium worrisome for hemorrhage though discrete area active extravasation is not identified.   Lungs/Pleura: Subsegmental atelectasis adjacent to the aneurysmal descending thoracic aorta. Minimal dependent subpleural ground-glass atelectasis. No discrete focal airspace opacities. No pleural effusion or pneumothorax. The central pulmonary airways appear widely patent. No discrete pulmonary nodules.   Musculoskeletal: No acute or aggressive osseous abnormalities. Regional soft tissues appear normal. The thyroid gland is atrophic but otherwise normal in appearance.   _________________________________________________________   _________________________________________________________   CTA ABDOMEN AND PELVIS FINDINGS   VASCULAR   Aorta: Interval increase in size of infrarenal abdominal aortic aneurysm now measuring 4.6 cm in maximal diameter (image 134, series 4, previously, 3.8 cm (when compared to the 06/2017 examination. The aneurysm is estimated to arise approximately 1.5 cm caudal to the take-off of the most inferior left renal artery and extend to approximately 1.3 cm above the aortic bifurcation. There is a large amount of crescentic mural thrombus within the dominant component of the aneurysm, not definitely resulting in hemodynamically significant stenosis. No evidence of abdominal aortic dissection or perivascular stranding.   Celiac: There is a  minimal amount of mixed calcified and noncalcified atherosclerotic plaque involving the origin the celiac artery, not resulting in a hemodynamically significant stenosis. Conventional branching pattern.   SMA: There is a moderate amount of eccentric mixed calcified and noncalcified atherosclerotic plaque involving the origin and proximal aspects of the SMA, not resulting in a hemodynamically significant stenosis. The distal tributaries of  the SMA appear widely patent without discrete lumen filling defect to suggest distal embolism.   Renals: Solitary bilaterally. There is a minimal amount of calcified atherosclerotic plaque involving the origin of the bilateral renal arteries, not resulting in hemodynamically significant stenosis. No vessel irregularity to suggest FMD.   IMA: Remains patent though its origin is surrounded by crescentic mural thrombus at the level of the abdominal aortic aneurysm.   Inflow: There is a moderate amount of mixed calcified and noncalcified atherosclerotic plaque involving the bilateral common iliac arteries, not resulting in hemodynamically significant stenosis. The bilateral internal iliac arteries are mildly disease though patent and of normal caliber. The bilateral external iliac arteries are markedly tortuous though of normal caliber and widely patent without hemodynamically significant stenosis.   The bilateral common and imaged portions of the bilateral deep and superficial femoral arteries are without a definitive hemodynamically significant narrowing.   Veins: The IVC and pelvic venous systems appear patent on this arterial phase examination.   Review of the MIP images confirms the above findings.   _________________________________________________________   NON-VASCULAR   Hepatobiliary: Normal hepatic contour. Note is made of an approximately 5.0 cm hypoattenuating cyst within the caudate (represent image 104, series 4). No discrete  hyperenhancing hepatic lesions. Normal appearance of the gallbladder given degree distention. No radiopaque gallstones. No intra or extrahepatic biliary ductal dilatation. No ascites.   Pancreas: Normal appearance of the pancreas.   Spleen: Normal appearance of the spleen.   Adrenals/Urinary Tract: There is symmetric enhancement of the bilateral kidneys. No evidence of nephrolithiasis on this postcontrast examination. No urine obstruction or perinephric stranding. There is mild thickening the bilateral adrenal glands, left greater than right, without discrete nodule. Diffuse thickening of the urinary bladder wall, potentially accentuated due to underdistention.   Stomach/Bowel: There are two portions of the transverse colon contained within a wide lack midline ventral wall abdominal hernia, not resulting in enteric obstruction though small amount of fluid is seen within the caudal aspect of the hernia. Moderate colonic stool burden without evidence of enteric obstruction. Normal appearance of the terminal ileum and the retrocecal appendix. No significant hiatal hernia. No pneumoperitoneum, pneumatosis or portal venous gas.   Lymphatic: No bulky retroperitoneal, mesenteric, pelvic or inguinal lymphadenopathy.   Reproductive: Normal appearance of the pelvic organs for age. No discrete adnexal lesion. No free fluid the pelvic cul-de-sac.   Other: Wide neck midline ventral wall hernia contains two discrete portions of the transverse colon. The hernia is estimated to measure at least 15.4 x 11.9 x 7.4 cm (sagittal image 132, series 9; axial image 125, series 4), while the neck of the hernia is estimated to measure at least 5.9 x 5.6 cm (axial image 126, series 4; sagittal image 130, series 9).   Musculoskeletal: No acute or aggressive osseous abnormalities. Moderate DDD of L5-S1 with disc space height loss, endplate irregularity and sclerosis.   Review of the MIP images confirms  the above findings.   IMPRESSION: Chest CTA impression:   1. The examination is positive for an acute short-segment ascending thoracic aortic dissection with flow seen within both the true and false lumens. High-density fluid is seen about the aortic root and pericardial sac compatible with hemorrhage though a discrete area of active extravasation is not identified. The type A dissection terminates at the level of the posterior aspect of the aortic arch without extension or involvement of the branch vessels of the aortic arch, all of which appear to arise from the true  lumen. 2. 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   Abdomen and pelvic CTA impression:   1. No acute findings within the abdomen or pelvis. 2. Interval increase in size of infrarenal abdominal aortic aneurysm, now measuring 4.6 cm in maximum diameter, previously, 3.9 cm (when compared to the 06/2017 examination). There is a large amount of crescentic mural thrombus within the dominant component of the aneurysm, not resulting in a hemodynamically significant stenosis. No evidence of abdominal aortic dissection or periaortic stranding. Aortic aneurysm NOS (ICD10-I71.9). 3. Wide neck midline ventral wall hernia contains two separate portions of the transverse colon, not resulting in enteric obstruction.   Critical Value/emergent results were called by telephone at the time of interpretation on 03/24/2021 at 2:36 pm to provider [facility] , who verbally acknowledged these results.     Electronically Signed   By: Sandi Mariscal M.D.   On: 03/24/2021 15:06     Assessment/Plan:  This 60 year old patient has an acute type A aortic dissection with a history of previous type B dissection in 2019 with an aortic arch and proximal descending aortic aneurysm.  Her descending aorta is diffusely enlarged.  She also has an abdominal aortic aneurysm.  She has a small pericardial  effusion.  She has remained hypertensive despite esmolol drip.  She also has a history of stage III chronic kidney disease with a creatinine of 1.99, severe LVH on prior echocardiogram, and ongoing smoking.  I think the best option is to proceed with surgical repair of her acute aortic dissection. I discussed the operative procedure with the patient including alternatives, benefits and high risks; including but not limited to bleeding, blood transfusion, infection, stroke, myocardial infarction, graft failure, heart block requiring a permanent pacemaker, organ dysfunction, and death.  Dorothy Harris understands and agrees to proceed.  We will schedule surgery for this evening.  Dorothy Harris 03/24/2021, 5:27 PM

## 2021-03-25 ENCOUNTER — Encounter (HOSPITAL_COMMUNITY): Payer: Self-pay | Admitting: Surgery

## 2021-03-25 ENCOUNTER — Inpatient Hospital Stay (HOSPITAL_COMMUNITY): Payer: PRIVATE HEALTH INSURANCE

## 2021-03-25 DIAGNOSIS — Z9911 Dependence on respirator [ventilator] status: Secondary | ICD-10-CM

## 2021-03-25 DIAGNOSIS — Z9889 Other specified postprocedural states: Secondary | ICD-10-CM

## 2021-03-25 DIAGNOSIS — J9601 Acute respiratory failure with hypoxia: Secondary | ICD-10-CM

## 2021-03-25 DIAGNOSIS — Z95828 Presence of other vascular implants and grafts: Secondary | ICD-10-CM

## 2021-03-25 DIAGNOSIS — I161 Hypertensive emergency: Secondary | ICD-10-CM

## 2021-03-25 LAB — POCT I-STAT, CHEM 8
BUN: 27 mg/dL — ABNORMAL HIGH (ref 6–20)
BUN: 29 mg/dL — ABNORMAL HIGH (ref 6–20)
BUN: 30 mg/dL — ABNORMAL HIGH (ref 6–20)
BUN: 30 mg/dL — ABNORMAL HIGH (ref 6–20)
BUN: 30 mg/dL — ABNORMAL HIGH (ref 6–20)
Calcium, Ion: 0.96 mmol/L — ABNORMAL LOW (ref 1.15–1.40)
Calcium, Ion: 1 mmol/L — ABNORMAL LOW (ref 1.15–1.40)
Calcium, Ion: 1.03 mmol/L — ABNORMAL LOW (ref 1.15–1.40)
Calcium, Ion: 1.04 mmol/L — ABNORMAL LOW (ref 1.15–1.40)
Calcium, Ion: 1.06 mmol/L — ABNORMAL LOW (ref 1.15–1.40)
Chloride: 101 mmol/L (ref 98–111)
Chloride: 102 mmol/L (ref 98–111)
Chloride: 102 mmol/L (ref 98–111)
Chloride: 103 mmol/L (ref 98–111)
Chloride: 103 mmol/L (ref 98–111)
Creatinine, Ser: 1.9 mg/dL — ABNORMAL HIGH (ref 0.44–1.00)
Creatinine, Ser: 2 mg/dL — ABNORMAL HIGH (ref 0.44–1.00)
Creatinine, Ser: 2 mg/dL — ABNORMAL HIGH (ref 0.44–1.00)
Creatinine, Ser: 2 mg/dL — ABNORMAL HIGH (ref 0.44–1.00)
Creatinine, Ser: 2.1 mg/dL — ABNORMAL HIGH (ref 0.44–1.00)
Glucose, Bld: 131 mg/dL — ABNORMAL HIGH (ref 70–99)
Glucose, Bld: 143 mg/dL — ABNORMAL HIGH (ref 70–99)
Glucose, Bld: 183 mg/dL — ABNORMAL HIGH (ref 70–99)
Glucose, Bld: 209 mg/dL — ABNORMAL HIGH (ref 70–99)
Glucose, Bld: 285 mg/dL — ABNORMAL HIGH (ref 70–99)
HCT: 22 % — ABNORMAL LOW (ref 36.0–46.0)
HCT: 24 % — ABNORMAL LOW (ref 36.0–46.0)
HCT: 25 % — ABNORMAL LOW (ref 36.0–46.0)
HCT: 25 % — ABNORMAL LOW (ref 36.0–46.0)
HCT: 26 % — ABNORMAL LOW (ref 36.0–46.0)
Hemoglobin: 7.5 g/dL — ABNORMAL LOW (ref 12.0–15.0)
Hemoglobin: 8.2 g/dL — ABNORMAL LOW (ref 12.0–15.0)
Hemoglobin: 8.5 g/dL — ABNORMAL LOW (ref 12.0–15.0)
Hemoglobin: 8.5 g/dL — ABNORMAL LOW (ref 12.0–15.0)
Hemoglobin: 8.8 g/dL — ABNORMAL LOW (ref 12.0–15.0)
Potassium: 4.8 mmol/L (ref 3.5–5.1)
Potassium: 5.3 mmol/L — ABNORMAL HIGH (ref 3.5–5.1)
Potassium: 5.6 mmol/L — ABNORMAL HIGH (ref 3.5–5.1)
Potassium: 6.2 mmol/L — ABNORMAL HIGH (ref 3.5–5.1)
Potassium: 7.7 mmol/L (ref 3.5–5.1)
Sodium: 134 mmol/L — ABNORMAL LOW (ref 135–145)
Sodium: 134 mmol/L — ABNORMAL LOW (ref 135–145)
Sodium: 134 mmol/L — ABNORMAL LOW (ref 135–145)
Sodium: 135 mmol/L (ref 135–145)
Sodium: 139 mmol/L (ref 135–145)
TCO2: 22 mmol/L (ref 22–32)
TCO2: 23 mmol/L (ref 22–32)
TCO2: 24 mmol/L (ref 22–32)
TCO2: 25 mmol/L (ref 22–32)
TCO2: 26 mmol/L (ref 22–32)

## 2021-03-25 LAB — POCT I-STAT 7, (LYTES, BLD GAS, ICA,H+H)
Acid-Base Excess: 1 mmol/L (ref 0.0–2.0)
Acid-Base Excess: 0 mmol/L (ref 0.0–2.0)
Acid-base deficit: 1 mmol/L (ref 0.0–2.0)
Acid-base deficit: 1 mmol/L (ref 0.0–2.0)
Acid-base deficit: 1 mmol/L (ref 0.0–2.0)
Acid-base deficit: 2 mmol/L (ref 0.0–2.0)
Acid-base deficit: 2 mmol/L (ref 0.0–2.0)
Acid-base deficit: 2 mmol/L (ref 0.0–2.0)
Acid-base deficit: 2 mmol/L (ref 0.0–2.0)
Acid-base deficit: 3 mmol/L — ABNORMAL HIGH (ref 0.0–2.0)
Acid-base deficit: 4 mmol/L — ABNORMAL HIGH (ref 0.0–2.0)
Acid-base deficit: 5 mmol/L — ABNORMAL HIGH (ref 0.0–2.0)
Bicarbonate: 20.2 mmol/L (ref 20.0–28.0)
Bicarbonate: 20.6 mmol/L (ref 20.0–28.0)
Bicarbonate: 21.4 mmol/L (ref 20.0–28.0)
Bicarbonate: 22.2 mmol/L (ref 20.0–28.0)
Bicarbonate: 22.5 mmol/L (ref 20.0–28.0)
Bicarbonate: 22.6 mmol/L (ref 20.0–28.0)
Bicarbonate: 23.4 mmol/L (ref 20.0–28.0)
Bicarbonate: 24.2 mmol/L (ref 20.0–28.0)
Bicarbonate: 25 mmol/L (ref 20.0–28.0)
Bicarbonate: 25.1 mmol/L (ref 20.0–28.0)
Bicarbonate: 25.3 mmol/L (ref 20.0–28.0)
Bicarbonate: 25.7 mmol/L (ref 20.0–28.0)
Calcium, Ion: 0.9 mmol/L — ABNORMAL LOW (ref 1.15–1.40)
Calcium, Ion: 0.95 mmol/L — ABNORMAL LOW (ref 1.15–1.40)
Calcium, Ion: 0.98 mmol/L — ABNORMAL LOW (ref 1.15–1.40)
Calcium, Ion: 1.01 mmol/L — ABNORMAL LOW (ref 1.15–1.40)
Calcium, Ion: 1.04 mmol/L — ABNORMAL LOW (ref 1.15–1.40)
Calcium, Ion: 1.08 mmol/L — ABNORMAL LOW (ref 1.15–1.40)
Calcium, Ion: 1.1 mmol/L — ABNORMAL LOW (ref 1.15–1.40)
Calcium, Ion: 1.1 mmol/L — ABNORMAL LOW (ref 1.15–1.40)
Calcium, Ion: 1.1 mmol/L — ABNORMAL LOW (ref 1.15–1.40)
Calcium, Ion: 1.11 mmol/L — ABNORMAL LOW (ref 1.15–1.40)
Calcium, Ion: 1.12 mmol/L — ABNORMAL LOW (ref 1.15–1.40)
Calcium, Ion: 1.13 mmol/L — ABNORMAL LOW (ref 1.15–1.40)
HCT: 20 % — ABNORMAL LOW (ref 36.0–46.0)
HCT: 22 % — ABNORMAL LOW (ref 36.0–46.0)
HCT: 23 % — ABNORMAL LOW (ref 36.0–46.0)
HCT: 24 % — ABNORMAL LOW (ref 36.0–46.0)
HCT: 24 % — ABNORMAL LOW (ref 36.0–46.0)
HCT: 26 % — ABNORMAL LOW (ref 36.0–46.0)
HCT: 28 % — ABNORMAL LOW (ref 36.0–46.0)
HCT: 28 % — ABNORMAL LOW (ref 36.0–46.0)
HCT: 31 % — ABNORMAL LOW (ref 36.0–46.0)
HCT: 31 % — ABNORMAL LOW (ref 36.0–46.0)
HCT: 31 % — ABNORMAL LOW (ref 36.0–46.0)
HCT: 32 % — ABNORMAL LOW (ref 36.0–46.0)
Hemoglobin: 10.5 g/dL — ABNORMAL LOW (ref 12.0–15.0)
Hemoglobin: 10.5 g/dL — ABNORMAL LOW (ref 12.0–15.0)
Hemoglobin: 10.5 g/dL — ABNORMAL LOW (ref 12.0–15.0)
Hemoglobin: 10.9 g/dL — ABNORMAL LOW (ref 12.0–15.0)
Hemoglobin: 6.8 g/dL — CL (ref 12.0–15.0)
Hemoglobin: 7.5 g/dL — ABNORMAL LOW (ref 12.0–15.0)
Hemoglobin: 7.8 g/dL — ABNORMAL LOW (ref 12.0–15.0)
Hemoglobin: 8.2 g/dL — ABNORMAL LOW (ref 12.0–15.0)
Hemoglobin: 8.2 g/dL — ABNORMAL LOW (ref 12.0–15.0)
Hemoglobin: 8.8 g/dL — ABNORMAL LOW (ref 12.0–15.0)
Hemoglobin: 9.5 g/dL — ABNORMAL LOW (ref 12.0–15.0)
Hemoglobin: 9.5 g/dL — ABNORMAL LOW (ref 12.0–15.0)
O2 Saturation: 100 %
O2 Saturation: 100 %
O2 Saturation: 100 %
O2 Saturation: 100 %
O2 Saturation: 100 %
O2 Saturation: 87 %
O2 Saturation: 89 %
O2 Saturation: 90 %
O2 Saturation: 90 %
O2 Saturation: 92 %
O2 Saturation: 93 %
O2 Saturation: 99 %
Patient temperature: 34
Patient temperature: 36
Patient temperature: 36
Patient temperature: 36.6
Patient temperature: 36.6
Patient temperature: 37.6
Potassium: 4.1 mmol/L (ref 3.5–5.1)
Potassium: 4.1 mmol/L (ref 3.5–5.1)
Potassium: 4.2 mmol/L (ref 3.5–5.1)
Potassium: 4.5 mmol/L (ref 3.5–5.1)
Potassium: 4.6 mmol/L (ref 3.5–5.1)
Potassium: 4.6 mmol/L (ref 3.5–5.1)
Potassium: 4.8 mmol/L (ref 3.5–5.1)
Potassium: 4.8 mmol/L (ref 3.5–5.1)
Potassium: 5.3 mmol/L — ABNORMAL HIGH (ref 3.5–5.1)
Potassium: 6.3 mmol/L (ref 3.5–5.1)
Potassium: 7 mmol/L (ref 3.5–5.1)
Potassium: 7.4 mmol/L (ref 3.5–5.1)
Sodium: 133 mmol/L — ABNORMAL LOW (ref 135–145)
Sodium: 133 mmol/L — ABNORMAL LOW (ref 135–145)
Sodium: 133 mmol/L — ABNORMAL LOW (ref 135–145)
Sodium: 134 mmol/L — ABNORMAL LOW (ref 135–145)
Sodium: 135 mmol/L (ref 135–145)
Sodium: 139 mmol/L (ref 135–145)
Sodium: 139 mmol/L (ref 135–145)
Sodium: 140 mmol/L (ref 135–145)
Sodium: 140 mmol/L (ref 135–145)
Sodium: 140 mmol/L (ref 135–145)
Sodium: 140 mmol/L (ref 135–145)
Sodium: 140 mmol/L (ref 135–145)
TCO2: 21 mmol/L — ABNORMAL LOW (ref 22–32)
TCO2: 21 mmol/L — ABNORMAL LOW (ref 22–32)
TCO2: 23 mmol/L (ref 22–32)
TCO2: 23 mmol/L (ref 22–32)
TCO2: 24 mmol/L (ref 22–32)
TCO2: 24 mmol/L (ref 22–32)
TCO2: 25 mmol/L (ref 22–32)
TCO2: 26 mmol/L (ref 22–32)
TCO2: 26 mmol/L (ref 22–32)
TCO2: 26 mmol/L (ref 22–32)
TCO2: 27 mmol/L (ref 22–32)
TCO2: 27 mmol/L (ref 22–32)
pCO2 arterial: 28.9 mmHg — ABNORMAL LOW (ref 32.0–48.0)
pCO2 arterial: 32.2 mmHg (ref 32.0–48.0)
pCO2 arterial: 34.4 mmHg (ref 32.0–48.0)
pCO2 arterial: 35.1 mmHg (ref 32.0–48.0)
pCO2 arterial: 37.8 mmHg (ref 32.0–48.0)
pCO2 arterial: 38.5 mmHg (ref 32.0–48.0)
pCO2 arterial: 38.6 mmHg (ref 32.0–48.0)
pCO2 arterial: 38.9 mmHg (ref 32.0–48.0)
pCO2 arterial: 42.7 mmHg (ref 32.0–48.0)
pCO2 arterial: 44.1 mmHg (ref 32.0–48.0)
pCO2 arterial: 45.2 mmHg (ref 32.0–48.0)
pCO2 arterial: 46.1 mmHg (ref 32.0–48.0)
pH, Arterial: 7.274 — ABNORMAL LOW (ref 7.350–7.450)
pH, Arterial: 7.338 — ABNORMAL LOW (ref 7.350–7.450)
pH, Arterial: 7.362 (ref 7.350–7.450)
pH, Arterial: 7.373 (ref 7.350–7.450)
pH, Arterial: 7.386 (ref 7.350–7.450)
pH, Arterial: 7.388 (ref 7.350–7.450)
pH, Arterial: 7.404 (ref 7.350–7.450)
pH, Arterial: 7.408 (ref 7.350–7.450)
pH, Arterial: 7.41 (ref 7.350–7.450)
pH, Arterial: 7.425 (ref 7.350–7.450)
pH, Arterial: 7.428 (ref 7.350–7.450)
pH, Arterial: 7.461 — ABNORMAL HIGH (ref 7.350–7.450)
pO2, Arterial: 175 mmHg — ABNORMAL HIGH (ref 83.0–108.0)
pO2, Arterial: 211 mmHg — ABNORMAL HIGH (ref 83.0–108.0)
pO2, Arterial: 314 mmHg — ABNORMAL HIGH (ref 83.0–108.0)
pO2, Arterial: 321 mmHg — ABNORMAL HIGH (ref 83.0–108.0)
pO2, Arterial: 336 mmHg — ABNORMAL HIGH (ref 83.0–108.0)
pO2, Arterial: 345 mmHg — ABNORMAL HIGH (ref 83.0–108.0)
pO2, Arterial: 50 mmHg — ABNORMAL LOW (ref 83.0–108.0)
pO2, Arterial: 51 mmHg — ABNORMAL LOW (ref 83.0–108.0)
pO2, Arterial: 57 mmHg — ABNORMAL LOW (ref 83.0–108.0)
pO2, Arterial: 58 mmHg — ABNORMAL LOW (ref 83.0–108.0)
pO2, Arterial: 62 mmHg — ABNORMAL LOW (ref 83.0–108.0)
pO2, Arterial: 68 mmHg — ABNORMAL LOW (ref 83.0–108.0)

## 2021-03-25 LAB — POCT I-STAT EG7
Acid-base deficit: 1 mmol/L (ref 0.0–2.0)
Bicarbonate: 23.4 mmol/L (ref 20.0–28.0)
Calcium, Ion: 1.52 mmol/L (ref 1.15–1.40)
HCT: 26 % — ABNORMAL LOW (ref 36.0–46.0)
Hemoglobin: 8.8 g/dL — ABNORMAL LOW (ref 12.0–15.0)
O2 Saturation: 51 %
Potassium: 7.7 mmol/L (ref 3.5–5.1)
Sodium: 134 mmol/L — ABNORMAL LOW (ref 135–145)
TCO2: 25 mmol/L (ref 22–32)
pCO2, Ven: 37.1 mmHg — ABNORMAL LOW (ref 44.0–60.0)
pH, Ven: 7.407 (ref 7.250–7.430)
pO2, Ven: 27 mmHg — CL (ref 32.0–45.0)

## 2021-03-25 LAB — GLUCOSE, CAPILLARY
Glucose-Capillary: 134 mg/dL — ABNORMAL HIGH (ref 70–99)
Glucose-Capillary: 148 mg/dL — ABNORMAL HIGH (ref 70–99)
Glucose-Capillary: 131 mg/dL — ABNORMAL HIGH (ref 70–99)
Glucose-Capillary: 154 mg/dL — ABNORMAL HIGH (ref 70–99)
Glucose-Capillary: 119 mg/dL — ABNORMAL HIGH (ref 70–99)
Glucose-Capillary: 178 mg/dL — ABNORMAL HIGH (ref 70–99)
Glucose-Capillary: 113 mg/dL — ABNORMAL HIGH (ref 70–99)
Glucose-Capillary: 80 mg/dL (ref 70–99)
Glucose-Capillary: 130 mg/dL — ABNORMAL HIGH (ref 70–99)
Glucose-Capillary: 164 mg/dL — ABNORMAL HIGH (ref 70–99)
Glucose-Capillary: 192 mg/dL — ABNORMAL HIGH (ref 70–99)
Glucose-Capillary: 132 mg/dL — ABNORMAL HIGH (ref 70–99)
Glucose-Capillary: 141 mg/dL — ABNORMAL HIGH (ref 70–99)
Glucose-Capillary: 144 mg/dL — ABNORMAL HIGH (ref 70–99)
Glucose-Capillary: 147 mg/dL — ABNORMAL HIGH (ref 70–99)
Glucose-Capillary: 154 mg/dL — ABNORMAL HIGH (ref 70–99)

## 2021-03-25 LAB — CBC
HCT: 28.2 % — ABNORMAL LOW (ref 36.0–46.0)
HCT: 30.4 % — ABNORMAL LOW (ref 36.0–46.0)
Hemoglobin: 9.4 g/dL — ABNORMAL LOW (ref 12.0–15.0)
Hemoglobin: 10.2 g/dL — ABNORMAL LOW (ref 12.0–15.0)
MCH: 32.8 pg (ref 26.0–34.0)
MCH: 32.1 pg (ref 26.0–34.0)
MCHC: 33.3 g/dL (ref 30.0–36.0)
MCHC: 33.6 g/dL (ref 30.0–36.0)
MCV: 98.3 fL (ref 80.0–100.0)
MCV: 95.6 fL (ref 80.0–100.0)
Platelets: 106 10*3/uL — ABNORMAL LOW (ref 150–400)
Platelets: 105 10*3/uL — ABNORMAL LOW (ref 150–400)
RBC: 2.87 MIL/uL — ABNORMAL LOW (ref 3.87–5.11)
RBC: 3.18 MIL/uL — ABNORMAL LOW (ref 3.87–5.11)
RDW: 19.1 % — ABNORMAL HIGH (ref 11.5–15.5)
RDW: 20.3 % — ABNORMAL HIGH (ref 11.5–15.5)
WBC: 11.5 10*3/uL — ABNORMAL HIGH (ref 4.0–10.5)
WBC: 12.6 10*3/uL — ABNORMAL HIGH (ref 4.0–10.5)
nRBC: 0 % (ref 0.0–0.2)
nRBC: 0 % (ref 0.0–0.2)

## 2021-03-25 LAB — BASIC METABOLIC PANEL
Anion gap: 10 (ref 5–15)
Anion gap: 11 (ref 5–15)
BUN: 24 mg/dL — ABNORMAL HIGH (ref 6–20)
BUN: 24 mg/dL — ABNORMAL HIGH (ref 6–20)
CO2: 24 mmol/L (ref 22–32)
CO2: 25 mmol/L (ref 22–32)
Calcium: 7.9 mg/dL — ABNORMAL LOW (ref 8.9–10.3)
Calcium: 8.2 mg/dL — ABNORMAL LOW (ref 8.9–10.3)
Chloride: 103 mmol/L (ref 98–111)
Chloride: 102 mmol/L (ref 98–111)
Creatinine, Ser: 1.99 mg/dL — ABNORMAL HIGH (ref 0.44–1.00)
Creatinine, Ser: 2.21 mg/dL — ABNORMAL HIGH (ref 0.44–1.00)
GFR, Estimated: 28 mL/min — ABNORMAL LOW (ref >60)
GFR, Estimated: 25 mL/min — ABNORMAL LOW (ref >60)
Glucose, Bld: 121 mg/dL — ABNORMAL HIGH (ref 70–99)
Glucose, Bld: 175 mg/dL — ABNORMAL HIGH (ref 70–99)
Potassium: 4.4 mmol/L (ref 3.5–5.1)
Potassium: 4.7 mmol/L (ref 3.5–5.1)
Sodium: 137 mmol/L (ref 135–145)
Sodium: 138 mmol/L (ref 135–145)

## 2021-03-25 LAB — HEMOGLOBIN AND HEMATOCRIT, BLOOD
HCT: 24.1 % — ABNORMAL LOW (ref 36.0–46.0)
Hemoglobin: 8.1 g/dL — ABNORMAL LOW (ref 12.0–15.0)

## 2021-03-25 LAB — ECHO INTRAOPERATIVE TEE
Height: 67 in
Weight: 2800 oz

## 2021-03-25 LAB — MAGNESIUM
Magnesium: 2.6 mg/dL — ABNORMAL HIGH (ref 1.7–2.4)
Magnesium: 2.6 mg/dL — ABNORMAL HIGH (ref 1.7–2.4)

## 2021-03-25 LAB — FIBRINOGEN: Fibrinogen: 168 mg/dL — ABNORMAL LOW (ref 210–475)

## 2021-03-25 LAB — PROTIME-INR
INR: 1.4 — ABNORMAL HIGH (ref 0.8–1.2)
Prothrombin Time: 16.7 seconds — ABNORMAL HIGH (ref 11.4–15.2)

## 2021-03-25 LAB — PLATELET COUNT: Platelets: 83 10*3/uL — ABNORMAL LOW (ref 150–400)

## 2021-03-25 LAB — PREPARE RBC (CROSSMATCH)

## 2021-03-25 LAB — MRSA NEXT GEN BY PCR, NASAL: MRSA by PCR Next Gen: NOT DETECTED

## 2021-03-25 LAB — APTT: aPTT: 34 seconds (ref 24–36)

## 2021-03-25 MED ORDER — MORPHINE SULFATE (PF) 2 MG/ML IV SOLN
1.0000 mg | INTRAVENOUS | Status: DC | PRN
  Administered 2021-03-25 – 2021-03-26 (×5): 2 mg via INTRAVENOUS
  Filled 2021-03-25 (×5): qty 1

## 2021-03-25 MED ORDER — NICARDIPINE HCL IN NACL 20-0.86 MG/200ML-% IV SOLN
3.0000 mg/h | INTRAVENOUS | Status: DC
  Administered 2021-03-25: 17:00:00 15 mg/h via INTRAVENOUS
  Administered 2021-03-25: 14:00:00 12.5 mg/h via INTRAVENOUS
  Administered 2021-03-25: 15:00:00 10 mg/h via INTRAVENOUS
  Administered 2021-03-25: 10:00:00 7.5 mg/h via INTRAVENOUS
  Administered 2021-03-25: 21:00:00 5 mg/h via INTRAVENOUS
  Administered 2021-03-26: 23:00:00 10 mg/h via INTRAVENOUS
  Administered 2021-03-26: 17:00:00 3 mg/h via INTRAVENOUS
  Administered 2021-03-26: 21:00:00 10 mg/h via INTRAVENOUS
  Administered 2021-03-26: 11:00:00 5 mg/h via INTRAVENOUS
  Administered 2021-03-27: 08:00:00 10 mg/h via INTRAVENOUS
  Administered 2021-03-27 (×2): 7.5 mg/h via INTRAVENOUS
  Filled 2021-03-25 (×12): qty 200

## 2021-03-25 MED ORDER — BISACODYL 5 MG PO TBEC
10.0000 mg | DELAYED_RELEASE_TABLET | Freq: Every day | ORAL | Status: DC
  Administered 2021-03-26 – 2021-04-02 (×8): 10 mg via ORAL
  Filled 2021-03-25 (×8): qty 2

## 2021-03-25 MED ORDER — PHENYLEPHRINE HCL-NACL 20-0.9 MG/250ML-% IV SOLN: 0.0000 ug/min | INTRAVENOUS | Status: DC

## 2021-03-25 MED ORDER — DEXMEDETOMIDINE HCL IN NACL 400 MCG/100ML IV SOLN
INTRAVENOUS | Status: AC
  Filled 2021-03-25: qty 100

## 2021-03-25 MED ORDER — HYDRALAZINE HCL 20 MG/ML IJ SOLN
10.0000 mg | INTRAMUSCULAR | Status: DC | PRN
  Administered 2021-03-25 – 2021-03-27 (×5): 10 mg via INTRAVENOUS
  Filled 2021-03-25 (×5): qty 1

## 2021-03-25 MED ORDER — CHLORHEXIDINE GLUCONATE 0.12 % MT SOLN
15.0000 mL | OROMUCOSAL | Status: AC
  Administered 2021-03-25: 06:00:00 15 mL via OROMUCOSAL

## 2021-03-25 MED ORDER — DOCUSATE SODIUM 50 MG/5ML PO LIQD: 200.0000 mg | Freq: Every day | ORAL | Status: DC

## 2021-03-25 MED ORDER — MAGNESIUM SULFATE 4 GM/100ML IV SOLN
4.0000 g | Freq: Once | INTRAVENOUS | Status: AC
  Administered 2021-03-25: 06:00:00 4 g via INTRAVENOUS
  Filled 2021-03-25: qty 100

## 2021-03-25 MED ORDER — POTASSIUM CHLORIDE 10 MEQ/50ML IV SOLN: 10.0000 meq | INTRAVENOUS | Status: AC

## 2021-03-25 MED ORDER — CHLORHEXIDINE GLUCONATE 0.12% ORAL RINSE (MEDLINE KIT)
15.0000 mL | Freq: Two times a day (BID) | OROMUCOSAL | Status: DC
  Administered 2021-03-25 – 2021-03-26 (×3): 15 mL via OROMUCOSAL

## 2021-03-25 MED ORDER — METOPROLOL TARTRATE 5 MG/5ML IV SOLN: 2.5000 mg | INTRAVENOUS | Status: DC | PRN

## 2021-03-25 MED ORDER — FENTANYL CITRATE (PF) 250 MCG/5ML IJ SOLN
INTRAMUSCULAR | Status: AC
  Filled 2021-03-25: qty 5

## 2021-03-25 MED ORDER — ACETAMINOPHEN 160 MG/5ML PO SOLN: 650.0000 mg | Freq: Once | ORAL | Status: AC

## 2021-03-25 MED ORDER — NICARDIPINE HCL IN NACL 20-0.86 MG/200ML-% IV SOLN
INTRAVENOUS | Status: AC
  Administered 2021-03-25: 06:00:00 3 mg/h via INTRAVENOUS
  Filled 2021-03-25: qty 200

## 2021-03-25 MED ORDER — ACETAMINOPHEN 500 MG PO TABS
1000.0000 mg | ORAL_TABLET | Freq: Four times a day (QID) | ORAL | Status: AC
  Administered 2021-03-26 – 2021-03-30 (×16): 1000 mg via ORAL
  Filled 2021-03-25 (×17): qty 2

## 2021-03-25 MED ORDER — LACTATED RINGERS IV SOLN: INTRAVENOUS | Status: DC

## 2021-03-25 MED ORDER — METOPROLOL TARTRATE 25 MG/10 ML ORAL SUSPENSION: 12.5000 mg | Freq: Two times a day (BID) | ORAL | Status: DC

## 2021-03-25 MED ORDER — FUROSEMIDE 10 MG/ML IJ SOLN
INTRAMUSCULAR | Status: DC | PRN
  Administered 2021-03-25: 40 mg via INTRAMUSCULAR

## 2021-03-25 MED ORDER — SODIUM CHLORIDE 0.9 % IV SOLN: INTRAVENOUS | Status: DC | PRN

## 2021-03-25 MED ORDER — SODIUM CHLORIDE 0.9 % IV SOLN: INTRAVENOUS | Status: DC

## 2021-03-25 MED ORDER — NITROGLYCERIN IN D5W 200-5 MCG/ML-% IV SOLN
0.0000 ug/min | INTRAVENOUS | Status: DC
  Administered 2021-03-25: 17:00:00 5 ug/min via INTRAVENOUS
  Filled 2021-03-25: qty 250

## 2021-03-25 MED ORDER — CEFAZOLIN SODIUM-DEXTROSE 2-4 GM/100ML-% IV SOLN
2.0000 g | Freq: Three times a day (TID) | INTRAVENOUS | Status: AC
  Administered 2021-03-25 – 2021-03-26 (×6): 2 g via INTRAVENOUS
  Filled 2021-03-25 (×6): qty 100

## 2021-03-25 MED ORDER — MILRINONE LACTATE IN DEXTROSE 20-5 MG/100ML-% IV SOLN: 0.3000 ug/kg/min | INTRAVENOUS | Status: DC

## 2021-03-25 MED ORDER — DEXMEDETOMIDINE HCL IN NACL 400 MCG/100ML IV SOLN
0.0000 ug/kg/h | INTRAVENOUS | Status: DC
  Administered 2021-03-25 (×2): 0.7 ug/kg/h via INTRAVENOUS
  Administered 2021-03-26: 07:00:00 0.5 ug/kg/h via INTRAVENOUS
  Filled 2021-03-25 (×2): qty 100

## 2021-03-25 MED ORDER — DOPAMINE-DEXTROSE 3.2-5 MG/ML-% IV SOLN: 3.0000 ug/kg/min | INTRAVENOUS | Status: DC

## 2021-03-25 MED ORDER — ASPIRIN EC 325 MG PO TBEC
325.0000 mg | DELAYED_RELEASE_TABLET | Freq: Every day | ORAL | Status: DC
  Administered 2021-03-26 – 2021-04-02 (×8): 325 mg via ORAL
  Filled 2021-03-25 (×8): qty 1

## 2021-03-25 MED ORDER — CHLORHEXIDINE GLUCONATE CLOTH 2 % EX PADS
6.0000 | MEDICATED_PAD | Freq: Every day | CUTANEOUS | Status: DC
  Administered 2021-03-25 – 2021-04-15 (×22): 6 via TOPICAL

## 2021-03-25 MED ORDER — METOPROLOL TARTRATE 12.5 MG HALF TABLET: 12.5000 mg | ORAL_TABLET | Freq: Two times a day (BID) | ORAL | Status: DC

## 2021-03-25 MED ORDER — SODIUM ZIRCONIUM CYCLOSILICATE 10 G PO PACK
10.0000 g | PACK | ORAL | Status: DC
  Filled 2021-03-25: qty 1

## 2021-03-25 MED ORDER — LACTATED RINGERS IV SOLN: 500.0000 mL | Freq: Once | INTRAVENOUS | Status: DC | PRN

## 2021-03-25 MED ORDER — ORAL CARE MOUTH RINSE
15.0000 mL | OROMUCOSAL | Status: DC
  Administered 2021-03-25 – 2021-03-26 (×11): 15 mL via OROMUCOSAL

## 2021-03-25 MED ORDER — INSULIN REGULAR(HUMAN) IN NACL 100-0.9 UT/100ML-% IV SOLN: INTRAVENOUS | Status: DC

## 2021-03-25 MED ORDER — MIDAZOLAM HCL 2 MG/2ML IJ SOLN
2.0000 mg | INTRAMUSCULAR | Status: DC | PRN
  Administered 2021-03-25: 08:00:00 2 mg via INTRAVENOUS
  Filled 2021-03-25: qty 2

## 2021-03-25 MED ORDER — SODIUM CHLORIDE 0.9% FLUSH: 3.0000 mL | INTRAVENOUS | Status: DC | PRN

## 2021-03-25 MED ORDER — BISACODYL 10 MG RE SUPP: 10.0000 mg | Freq: Every day | RECTAL | Status: DC

## 2021-03-25 MED ORDER — ASPIRIN 81 MG PO CHEW: 324.0000 mg | CHEWABLE_TABLET | Freq: Every day | ORAL | Status: DC

## 2021-03-25 MED ORDER — FAMOTIDINE IN NACL 20-0.9 MG/50ML-% IV SOLN
20.0000 mg | Freq: Two times a day (BID) | INTRAVENOUS | Status: AC
  Administered 2021-03-25 (×2): 20 mg via INTRAVENOUS
  Filled 2021-03-25 (×2): qty 50

## 2021-03-25 MED ORDER — TRAMADOL HCL 50 MG PO TABS
50.0000 mg | ORAL_TABLET | ORAL | Status: DC | PRN
  Administered 2021-03-26 – 2021-03-29 (×6): 50 mg via ORAL
  Administered 2021-03-30 – 2021-04-01 (×3): 100 mg via ORAL
  Filled 2021-03-25: qty 2
  Filled 2021-03-25 (×2): qty 1
  Filled 2021-03-25: qty 2
  Filled 2021-03-25 (×5): qty 1
  Filled 2021-03-25: qty 2

## 2021-03-25 MED ORDER — ACETAMINOPHEN 650 MG RE SUPP
650.0000 mg | Freq: Once | RECTAL | Status: AC
  Administered 2021-03-25: 06:00:00 650 mg via RECTAL

## 2021-03-25 MED ORDER — SODIUM CHLORIDE 0.9 % IV SOLN: 250.0000 mL | INTRAVENOUS | Status: DC

## 2021-03-25 MED ORDER — SODIUM CHLORIDE 0.9% IV SOLUTION: Freq: Once | INTRAVENOUS | Status: AC

## 2021-03-25 MED ORDER — ALBUMIN HUMAN 5 % IV SOLN
250.0000 mL | INTRAVENOUS | Status: AC | PRN
  Administered 2021-03-25 (×2): 12.5 g via INTRAVENOUS
  Filled 2021-03-25 (×2): qty 250

## 2021-03-25 MED ORDER — ACETAMINOPHEN 160 MG/5ML PO SOLN
1000.0000 mg | Freq: Four times a day (QID) | ORAL | Status: DC
  Administered 2021-03-25 – 2021-03-26 (×4): 1000 mg
  Filled 2021-03-25 (×4): qty 40.6

## 2021-03-25 MED ORDER — OXYCODONE HCL 5 MG PO TABS
5.0000 mg | ORAL_TABLET | ORAL | Status: DC | PRN
  Administered 2021-03-26: 16:00:00 10 mg via ORAL
  Administered 2021-03-26 – 2021-03-29 (×12): 5 mg via ORAL
  Administered 2021-03-30 (×2): 10 mg via ORAL
  Administered 2021-03-30: 17:00:00 5 mg via ORAL
  Administered 2021-03-30: 08:00:00 10 mg via ORAL
  Administered 2021-03-30: 11:00:00 5 mg via ORAL
  Administered 2021-03-31 – 2021-04-02 (×10): 10 mg via ORAL
  Filled 2021-03-25 (×2): qty 1
  Filled 2021-03-25 (×2): qty 2
  Filled 2021-03-25 (×4): qty 1
  Filled 2021-03-25 (×7): qty 2
  Filled 2021-03-25: qty 1
  Filled 2021-03-25: qty 2
  Filled 2021-03-25 (×2): qty 1
  Filled 2021-03-25: qty 2
  Filled 2021-03-25 (×2): qty 1
  Filled 2021-03-25 (×2): qty 2
  Filled 2021-03-25 (×3): qty 1
  Filled 2021-03-25: qty 2

## 2021-03-25 MED ORDER — PROTAMINE SULFATE 10 MG/ML IV SOLN
INTRAVENOUS | Status: DC | PRN
  Administered 2021-03-25: 20 mg via INTRAVENOUS
  Administered 2021-03-25: 220 mg via INTRAVENOUS

## 2021-03-25 MED ORDER — DEXTROSE 50 % IV SOLN: 0.0000 mL | INTRAVENOUS | Status: DC | PRN

## 2021-03-25 MED ORDER — ONDANSETRON HCL 4 MG/2ML IJ SOLN
4.0000 mg | Freq: Four times a day (QID) | INTRAMUSCULAR | Status: DC | PRN
  Administered 2021-03-26 – 2021-04-04 (×4): 4 mg via INTRAVENOUS
  Filled 2021-03-25 (×4): qty 2

## 2021-03-25 MED ORDER — SODIUM CHLORIDE 0.9% FLUSH
3.0000 mL | Freq: Two times a day (BID) | INTRAVENOUS | Status: DC
  Administered 2021-03-26 – 2021-03-31 (×11): 3 mL via INTRAVENOUS

## 2021-03-25 MED ORDER — PANTOPRAZOLE 2 MG/ML SUSPENSION: 40.0000 mg | Freq: Every day | ORAL | Status: DC

## 2021-03-25 MED ORDER — VANCOMYCIN HCL IN DEXTROSE 1-5 GM/200ML-% IV SOLN
1000.0000 mg | Freq: Once | INTRAVENOUS | Status: AC
  Administered 2021-03-25: 11:00:00 1000 mg via INTRAVENOUS
  Filled 2021-03-25: qty 200

## 2021-03-25 MED ORDER — SODIUM CHLORIDE 0.45 % IV SOLN: INTRAVENOUS | Status: DC | PRN

## 2021-03-25 NOTE — Progress Notes (Signed)
Patient flipped back to full support on vent after rapid heart wean. Patient a little sleepy to perform parameters at this time. RN at bedside. Will attempt again later.

## 2021-03-25 NOTE — Op Note (Signed)
CARDIOVASCULAR SURGERY OPERATIVE NOTE  03/25/2021  Surgeon:  Gaye Pollack, MD  First Assistant: Ellwood Handler,  PA-C : An experienced assistant was required given the complexity of this surgery and the standard of surgical care. The assistant was needed for exposure, dissection, suctioning, retraction of delicate tissues and sutures, instrument exchange and for overall help during this procedure.    Preoperative Diagnosis:  Acute type A aortic dissection with contained rupture and hemopericardium   Postoperative Diagnosis:  Same   Procedure: Emergent  Median Sternotomy Anastomosis of 8 mm Hemashield graft to the right axillary artery. Extracorporeal circulation via the right axillary artery and right atrial appendage 4.   Replacement of aortic arch under deep hypothermic circulatory arrest with bypass of the innominate, left common carotid and left subclavian arteries using a 30 x 10 x 8 x 8 x 10 mm Hemashield Platinum graft. 5.   Supra-coronary replacement of the ascending aorta.   Anesthesia:  General Endotracheal   Clinical History/Surgical Indication:  The patient is a 60 year old smoker with a history of uncontrolled hypertension who had an acute type B aortic dissection in February 2019 and was seen by the vascular surgery service.  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  She had a repeat CTA of the chest in July 2019 which showed a stable aortic dissection.  The ascending aorta was unremarkable.  The descending aorta was stable at 4.2 cm.  She was subsequently lost to follow-up.  She reports about 10:30 AM developing substernal chest pain or shortness of breath while cleaning her house.  She sat down and rested and took some nitroglycerin which helped the pain.  She presented to [facility] emergency room initially had a CT scan of the chest without contrast which showed an aortic arch  aneurysm with some changes around the ascending aorta suggesting aortic dissection.  The scan was repeated with intravenous contrast and clearly shows an acute type a dissection beginning in the aortic root and extending up into the aortic arch to meet up with the previous type B dissection.  It does not appear to extend up into the arch vessels themselves.  There is a small pericardial effusion.  She has an acute type A aortic dissection with a history of previous type B dissection in 2019 with an aortic arch and proximal descending aortic aneurysm.  Her descending aorta is diffusely enlarged.  She also has an abdominal aortic aneurysm.  She has a small pericardial effusion.  She has remained hypertensive despite esmolol drip.  She also has a history of stage III chronic kidney disease with a creatinine of 1.99, severe LVH on prior echocardiogram, and ongoing smoking.  I think the best option is to proceed with surgical repair of her acute aortic dissection. I discussed the operative procedure with the patient including alternatives, benefits and high risks; including but not limited to bleeding, blood transfusion, infection, stroke, myocardial infarction, graft failure, heart block requiring a permanent pacemaker, organ dysfunction, and death.  Aris Everts Murley understands and agrees to proceed.   Preparation:  The patient was seen in the operating room and the correct patient, correct operation were confirmed with the patient after reviewing the medical record and. The consent was signed by me. Preoperative antibiotics were given. A pulmonary arterial line and radial arterial line were placed by the anesthesia team. The patient was positioned supine on the operating  room table. After being placed under general endotracheal anesthesia by the anesthesia team a foley catheter was placed. The neck, chest, abdomen, and both legs were prepped with betadine soap and solution and draped in the usual sterile manner. A  surgical time-out was taken and the correct patient and operative procedure were confirmed with the nursing and anesthesia staff.  TEE: performed by Dr. Roanna Banning. This showed severe LVH with an EF of 60% with a small LV cavity. There was no AI or AS. There was trivial MR.   Cardiopulmonary Bypass:  A median sternotomy was performed. The pericardium was opened in the midline. There was a large amount of fresh blood in the pericardium.Right ventricular function appeared normal. The ascending aorta was diffusely enlarged and ecchymotic and oozing blood.   Right axillary artery exposure and cannulation:  A transverse incision was made below the right clavicle. The pectoralis major muscle was split along its fibers and the pectoralis minor muscle was retracted laterally. The brachial plexus was identified and gently retracted laterally to expose the axillary artery. The artery was controlled proximally and distally with vessel loops. The patient was fully heparinized and ACT maintained greater than 400. The axillary artery was clamped proximally and distally with peripheral Debakey clamps. It was opened longitudinally. There was no sign of dissection here. An 8 mm Hemashield dacron graft was anastomosed in an end to side manner using continuous 5-0 prolene suture. CoSeal was applied for hemostasis and the clamp removed. The graft was connected to the arterial end of the bypass circuit which was fashioned in Y to allow cannulation of the right axillary artery and the arch graft itself. Venous cannulation was performed via the right atrial appendage using a two-staged venous cannula.    Resection and grafting of ascending aortic aneurysm:  The patient was placed on cardiopulmonary bypass and a left ventricular vent was placed via the right superior pulmonary vein. Systemic cooling was begun with a goal temperature of 18 degrees centigrade by bladder and rectal temperature probes. A retrograde cardioplegia  cannula was placed through the right atrium into the coronary sinus without difficulty. While cooling, the aortic arch and great vessels were identified and dissected free. There was a large amount of hematoma around the arch. After 30 minutes of cooling the target temperature of 18 degrees centigrade was reached.  BIS was zero. The patient was given Propofol and 125 mg of Solumedrol. The head was packed in ice. The bed was placed in steep trendelenburg. Circulatory arrest was begun and the blood volume emptied into the venous reservoir. The innominate artery was clamped and antegrade cerebral perfusion begun through the right axillary artery cannula. Cold KBC retrograde cardioplegia was given and myocardial temperature dropped to 10 degrees centigrade. Additional doses were given at approximately 60 minute intervals throughout the period of circulatory arrest and cross-clamping. Complete diastolic arrest was maintained. The aorta was transected just distal to the left subclavian artery where the aorta appeared to narrow back to about 3 cm. A fresh dissection tear was seen in the mid arch posteriorly and this entended retrograde but not antegrade. The aortic arch beyond this level became aneurysmal with a chronic descending dissection seen. The arch vessels were clamped and divided. The aortic diameter just distal to the left subclavian artery was measured at 30 mm here. A 30 x 10 x 8 x 8 x 10 mm Hemasheild Platinum vascular graft was prepared. ( REF [id number], Lot # [id number], SN [id number]). I tried to leave a  5 cm segment between the distal anastomosis and the first branch so that a stent graft could be placed in the descending aorta in the future if needed. It was anastomosed to the aortic arch in an end to end manner using 3-0 prolene continuous suture with a felt strip to reinforce the anastomisis. A light coating of CoSeal was applied to seal needle holes. The arterial end of the bypass circuit was then  connected to the 63mm side arm graft and circulation was slowly resumed.  The aortic graft was cross-clamped proximal to the side arm grafts and full CPB support was resumed. Circulatory arrest time was 43 minutes. Antegrade cerebral perfusion time was 40 minutes.  Aortic graft to left common carotid artery bypass:   The most distal branch was brought under the brachiocephalic vein and anastomosed to the left common carotid artery in end to end manner using continuous 4-0 Prolene suture. The anastomosis was flashed and the suture tied. The clamps were removed.   Aortic graft to innominate artery bypass:  The middle branch was brought under the brachiocephalic vein and anastomosed to the innominate artery in end to end manner using continuous 4-0 Prolene suture. The anastomosis was flashed and the suture tied. The clamps were removed.  Aortic graft to left  subclavian artery bypass:  The proximal branch was brought under the brachiocephalic vein and anastomosed to the left subclavian artery in end to end manner using continuous 4-0 Prolene suture. The anastomosis was flashed and the suture tied. The clamps were removed.  I used this order of the grafts because it seemed to allow the grafts to lie in the mediastinum without kinking. A graft marker was placed around the orign of all three grafts.  Supra-coronary aortic anastomosis:  The ascending aorta was mobilized off the main PA and right PA back to the supra-coronary level. The aorta was transected about 2 cm distal to the left main coronary. The dissection plane extended proximally into the noncoronary and right coronary sinuses and was full of fresh thrombus. This was removed. The layers of the aorta were reinforced using a felt strip in the dissection plane and Bioglue. The aortic valve was mildly thickened but had no stenosis. It was trileaflet.    The aortic graft was then cut to the appropriate length and anastomosed end to end to the  supra-coronary aorta using continuous 3-0 prolene suture. CoSeal was applied to seal the needle holes in the grafts. A vent cannula was placed into the graft to remove any air. Deairing maneuvers were performed and the bed placed in trendelenburg position.   Completion:  The patient was rewarmed to 37 degrees Centigrade.  The crossclamp was removed with a time of 169 minutes. There was spontaneous return of sinus rhythm. The distal and proximal anastomoses were checked for hemostasis. The position of the grafts was satisfactory. The vascular anastomoses all appeared grossly hemostatic but the patient was coagulopathic and bleeding from all of the needle holes in the grafts and the mediastinal tissues. Two temporary epicardial pacing wires were placed on the right atrium and two on the right ventricle. The patient was weaned from CPB without difficulty on no inotropes. CPB time was 316 minutes. Cardiac output was 4 LPM. TEE showed unchanged LV systolic function, trial eccentric AI at one commissure and trival MR. Heparin was fully reversed with protamine and the venous cannula removed. The arterial side arm graft and the axillary artery graft were ligated with a #1 Silk tie and  divided. The stump was suture ligated with a 3-0 Prolene pledgetted horizontal mattress suture. Hemostasis was achieved. Mediastinal drainage tubes were placed. The sternum was closed with double #6 stainless steel wires. The fascia was closed with continuous # 1 vicryl suture. The subcutaneous tissue was closed with 2-0 vicryl continuous suture. The skin was closed with 3-0 vicryl subcuticular suture. All sponge, needle, and instrument counts were reported correct at the end of the case. Dry sterile dressings were placed over the incisions and around the chest tubes which were connected to pleurevac suction. The patient was then transported to the surgical intensive care unit in critical but stable condition.

## 2021-03-25 NOTE — Progress Notes (Signed)
EVENING ROUNDS NOTE :     [address]       [place] [address]             [phone]                 1 Day Post-Op Procedure(s) (LRB): 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 USING HEMASHIELD PLATINUM  [id number] mm (N/A) INTRAOPERATIVE TRANSESOPHAGEAL ECHOCARDIOGRAM APPLICATION OF CELL SAVER   Total Length of Stay:  LOS: 1 day  Events:   Attempted wean On cardene Low CT output    BP 126/79    Pulse 81    Temp 98.1 F (36.7 C)    Resp (!) 25    Ht $R'5\' 7"'mz$  (1.702 m)    Wt 79.4 kg    SpO2 90%    BMI 27.41 kg/m   PAP: (16-30)/(0-21) 29/12 CO:  [2.6 L/min-5.2 L/min] 4.8 L/min CI:  [1.4 L/min/m2-2.7 L/min/m2] 2.5 L/min/m2  Vent Mode: SIMV;PSV;PRVC FiO2 (%):  [40 %-80 %] 50 % Set Rate:  [4 bmp-12 bmp] 12 bmp Vt Set:  [490 mL-610 mL] 490 mL PEEP:  [5 cmH20-8 cmH20] 8 cmH20 Pressure Support:  [10 cmH20] 10 cmH20 Plateau Pressure:  [20 cmH20-23 cmH20] 23 cmH20   sodium chloride 20 mL/hr at 03/25/21 1700   [START ON 03/26/2021] sodium chloride     sodium chloride 10 mL/hr at 03/25/21 0545   albumin human 12.5 g (03/25/21 1020)    ceFAZolin (ANCEF) IV Stopped (03/25/21 1423)   dexmedetomidine (PRECEDEX) IV infusion 0.7 mcg/kg/hr (03/25/21 0800)   famotidine (PEPCID) IV Stopped (03/25/21 1004)   insulin 1.2 Units/hr (03/25/21 1700)   lactated ringers     lactated ringers     lactated ringers 20 mL/hr at 03/25/21 1700   niCARDipine 15 mg/hr (03/25/21 1708)   nitroGLYCERIN 5 mcg/min (03/25/21 1705)   phenylephrine (NEO-SYNEPHRINE) Adult infusion Stopped (03/25/21 0634)    I/O last 3 completed shifts: In: 5632.2 [I.V.:2628.3; Blood:2799; IV Piggyback:204.9] Out: 8675 [Urine:1350; Blood:2417; Chest Tube:160]   CBC Latest Ref Rng & Units 03/25/2021 03/25/2021 03/25/2021  WBC 4.0 - 10.5 K/uL - - -  Hemoglobin 12.0 - 15.0 g/dL 10.5(L) 10.5(L) 10.5(L)  Hematocrit 36.0 - 46.0 % 31.0(L) 31.0(L) 31.0(L)  Platelets 150 - 400 K/uL - - -     BMP Latest Ref Rng & Units 03/25/2021 03/25/2021 03/25/2021  Glucose 70 - 99 mg/dL - - -  BUN 6 - 20 mg/dL - - -  Creatinine 0.44 - 1.00 mg/dL - - -  BUN/Creat Ratio 6 - 22 (calc) - - -  Sodium 135 - 145 mmol/L 140 140 139  Potassium 3.5 - 5.1 mmol/L 4.1 4.1 4.6  Chloride 98 - 111 mmol/L - - -  CO2 22 - 32 mmol/L - - -  Calcium 8.9 - 10.3 mg/dL - - -    ABG    Component Value Date/Time   PHART 7.425 03/25/2021 1738   PCO2ART 34.4 03/25/2021 1738   PO2ART 50 (L) 03/25/2021 1738   HCO3 22.6 03/25/2021 1738   TCO2 24 03/25/2021 1738   ACIDBASEDEF 1.0 03/25/2021 1738   O2SAT 87.0 03/25/2021 1738       Laquentin Loudermilk, MD 03/25/2021 5:59 PM
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
## 2021-03-25 NOTE — Transfer of Care (Signed)
Immediate Anesthesia Transfer of Care Note  Patient: MARAYA GWILLIAM  Procedure(s) Performed: 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 USING HEMASHIELD PLATINUM  [id number] mm (Chest) INTRAOPERATIVE TRANSESOPHAGEAL ECHOCARDIOGRAM APPLICATION OF CELL SAVER  Patient Location: SICU  Anesthesia Type:General  Level of Consciousness: Patient remains intubated per anesthesia plan  Airway & Oxygen Therapy: Patient remains intubated per anesthesia plan and Patient placed on Ventilator (see vital sign flow sheet for setting)  Post-op Assessment: Report given to RN and Post -op Vital signs reviewed and stable  Post vital signs: Reviewed and stable  Last Vitals:  Vitals Value Taken Time  BP 92/59 (69)   Temp 34.2 C 03/25/21 0527  Pulse 80 03/25/21 0527  Resp 12 03/25/21 0527  SpO2 100 % 03/25/21 0527  Vitals shown include unvalidated device data.  Last Pain:  Vitals:   03/24/21 1604  TempSrc: Oral  PainSc: 4          Complications: No notable events documented.

## 2021-03-25 NOTE — Progress Notes (Signed)
1 Day Post-Op Procedure(s) (LRB): 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 USING HEMASHIELD PLATINUM  [id number] mm (N/A) INTRAOPERATIVE TRANSESOPHAGEAL ECHOCARDIOGRAM APPLICATION OF CELL SAVER Subjective: Intubated. Starting to wake up.  Objective: Vital signs in last 24 hours: Temp:  [93.2 F (34 C)-98.2 F (36.8 C)] 96.6 F (35.9 C) (12/19 1200) Pulse Rate:  [58-80] 80 (12/19 1200) Cardiac Rhythm: A-V Sequential paced (12/19 1200) Resp:  [12-24] 13 (12/19 1200) BP: (91-184)/(61-120) 123/84 (12/19 1200) SpO2:  [94 %-100 %] 96 % (12/19 1200) Arterial Line BP: (89-148)/(54-85) 139/76 (12/19 1200) FiO2 (%):  [50 %-80 %] 50 % (12/19 1052)  Hemodynamic parameters for last 24 hours: PAP: (16-29)/(0-16) 29/8 CO:  [2.6 L/min-4.3 L/min] 4.3 L/min CI:  [1.4 L/min/m2-2.3 L/min/m2] 2.3 L/min/m2  Intake/Output from previous day: 12/18 0701 - 12/19 0700 In: 5632.2 [I.V.:2628.3; Blood:2799; IV Piggyback:204.9] Out: 9562 [Urine:1350; Blood:2417; Chest Tube:160] Intake/Output this shift: Total I/O In: 1008.1 [I.V.:457.9; Blood:315; IV Piggyback:235.3] Out: 1180 [Urine:1000; Chest Tube:180]  General appearance: intubated and still on Precedex Heart: regular rate and rhythm Lungs: clear to auscultation bilaterally Wound: dressing dry  Lab Results: Recent Labs    03/25/21 0538 03/25/21 1009 03/25/21 1140 03/25/21 1142  WBC 11.5*  --  12.6*  --   HGB 9.5*   9.4*   < > 10.2* 10.5*  HCT 28.0*   28.2*   < > 30.4* 31.0*  PLT 106*  --  105*  --    < > = values in this interval not displayed.   BMET:  Recent Labs    03/24/21 1126 03/24/21 2026 03/25/21 0420 03/25/21 0538 03/25/21 1009 03/25/21 1142  NA 136   < > 139   139 140   137 140 139  K 4.7   < > 4.8   4.8 4.5   4.4 4.6 4.6  CL 103   < > 101 103  --   --   CO2 23  --   --  24  --   --   GLUCOSE 143*   < > 183* 121*  --   --   BUN 23*   < > 27* 24*  --   --   CREATININE 1.99*   < > 2.00* 1.99*  --   --    CALCIUM 8.8*  --   --  7.9*  --   --    < > = values in this interval not displayed.    PT/INR:  Recent Labs    03/25/21 0538  LABPROT 16.7*  INR 1.4*   ABG    Component Value Date/Time   PHART 7.373 03/25/2021 1142   HCO3 25.1 03/25/2021 1142   TCO2 26 03/25/2021 1142   ACIDBASEDEF 1.0 03/25/2021 1142   O2SAT 92.0 03/25/2021 1142   CBG (last 3)  Recent Labs    03/25/21 0721 03/25/21 0833 03/25/21 1007  GLUCAP 148* 131* 154*    Assessment/Plan: S/P Procedure(s) (LRB): 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 USING HEMASHIELD PLATINUM  [id number] mm (N/A) INTRAOPERATIVE TRANSESOPHAGEAL ECHOCARDIOGRAM APPLICATION OF CELL SAVER  She is hemodynamically stable in sinus rhythm. Preop EF 60% with severe concentric LVH and very small LV cavity.   Hx of uncontrolled HTN on Norvasc, Coreg, Clonidine and Hydralazine preop.  Stage 3 CKD with preop creat 2.0 and hyperkalemia in OR.  DM: on no meds preop but Hgb A1c was 6.4 in 2019.  Ongoing smoking. PEEP increased to 8 for hypoxemia. Wean vent as tolerated per CCM.  CCM consulted for help with management of this patient with multiple co-morbidities that may complicate postop course.     LOS: 1 day    Gaye Pollack 03/25/2021

## 2021-03-25 NOTE — Progress Notes (Signed)
°  Transition of Care [facility]) Screening Note   Patient Details  Name: Dorothy Harris Date of Birth: 1960-05-20   Transition of Care [facility]) CM/SW Contact:    Milas Gain, [place] Phone Number: 03/25/2021, 4:44 PM    Transition of Care Department [facility]) has reviewed patient and no TOC needs have been identified at this time. We will continue to monitor patient advancement through interdisciplinary progression rounds. If new patient transition needs arise, please place a TOC consult.

## 2021-03-25 NOTE — Progress Notes (Signed)
Rapid wean protocol attempted. PaO2 on ABG is low. MD made aware. Patient placed back on full support.

## 2021-03-25 NOTE — Brief Op Note (Signed)
03/24/2021  1:07 PM  PATIENT:  Dorothy Harris  60 y.o. female  PRE-OPERATIVE DIAGNOSIS:  aortic dissection  POST-OPERATIVE DIAGNOSIS:  aortic dissection  PROCEDURE:  Procedure(s):  MEDIAN STERNOTOMY  REPAIR and REPLACEMENT ASCENDING AORTA with AORTIC ARCH REPLACMENT 30 mm x10x8x8x10 mm Hemashield Graft  HYPOTHERMIC CIRCULATORY ARREST  INTRAOPERATIVE TRANSESOPHAGEAL ECHOCARDIOGRAM  APPLICATION OF CELL SAVER  SURGEON:  Surgeon(s) and Role:    * Bartle, Fernande Boyden, MD - Primary  PHYSICIAN ASSISTANT: Ellwood Handler PA-C   ASSISTANTS: Ardyth Man RNFA   ANESTHESIA:   general  EBL:  2417 mL   BLOOD ADMINISTERED:2 units PRBC,  CELLSAVER, 4 FFP, and 2 PLTS, 2 CRYO   DRAINS:  Mediastinal Chest Drains    LOCAL MEDICATIONS USED:  NONE  SPECIMEN:  Source of Specimen:  Aortic Aneurysm  DISPOSITION OF SPECIMEN:  PATHOLOGY  COUNTS:  YES  TOURNIQUET:  * No tourniquets in log *  DICTATION: .Dragon Dictation  PLAN OF CARE: Admit to inpatient   PATIENT DISPOSITION:  ICU - intubated and critically ill.   Delay start of Pharmacological VTE agent (>24hrs) due to surgical blood loss or risk of bleeding: yes

## 2021-03-26 ENCOUNTER — Inpatient Hospital Stay (HOSPITAL_COMMUNITY): Payer: PRIVATE HEALTH INSURANCE

## 2021-03-26 LAB — PREPARE FRESH FROZEN PLASMA
Unit division: 0
Unit division: 0
Unit division: 0
Unit division: 0

## 2021-03-26 LAB — GLUCOSE, CAPILLARY
Glucose-Capillary: 125 mg/dL — ABNORMAL HIGH (ref 70–99)
Glucose-Capillary: 130 mg/dL — ABNORMAL HIGH (ref 70–99)
Glucose-Capillary: 130 mg/dL — ABNORMAL HIGH (ref 70–99)
Glucose-Capillary: 139 mg/dL — ABNORMAL HIGH (ref 70–99)
Glucose-Capillary: 144 mg/dL — ABNORMAL HIGH (ref 70–99)
Glucose-Capillary: 144 mg/dL — ABNORMAL HIGH (ref 70–99)
Glucose-Capillary: 144 mg/dL — ABNORMAL HIGH (ref 70–99)
Glucose-Capillary: 151 mg/dL — ABNORMAL HIGH (ref 70–99)
Glucose-Capillary: 157 mg/dL — ABNORMAL HIGH (ref 70–99)
Glucose-Capillary: 165 mg/dL — ABNORMAL HIGH (ref 70–99)
Glucose-Capillary: 172 mg/dL — ABNORMAL HIGH (ref 70–99)

## 2021-03-26 LAB — CBC WITH DIFFERENTIAL/PLATELET
Abs Immature Granulocytes: 0.05 10*3/uL (ref 0.00–0.07)
Basophils Absolute: 0 10*3/uL (ref 0.0–0.1)
Basophils Relative: 0 %
Eosinophils Absolute: 0 10*3/uL (ref 0.0–0.5)
Eosinophils Relative: 0 %
HCT: 26.4 % — ABNORMAL LOW (ref 36.0–46.0)
Hemoglobin: 8.8 g/dL — ABNORMAL LOW (ref 12.0–15.0)
Immature Granulocytes: 0 %
Lymphocytes Relative: 7 %
Lymphs Abs: 0.9 10*3/uL (ref 0.7–4.0)
MCH: 31.9 pg (ref 26.0–34.0)
MCHC: 33.3 g/dL (ref 30.0–36.0)
MCV: 95.7 fL (ref 80.0–100.0)
Monocytes Absolute: 1.4 10*3/uL — ABNORMAL HIGH (ref 0.1–1.0)
Monocytes Relative: 11 %
Neutro Abs: 10.9 10*3/uL — ABNORMAL HIGH (ref 1.7–7.7)
Neutrophils Relative %: 82 %
Platelets: 95 10*3/uL — ABNORMAL LOW (ref 150–400)
RBC: 2.76 MIL/uL — ABNORMAL LOW (ref 3.87–5.11)
RDW: 21 % — ABNORMAL HIGH (ref 11.5–15.5)
WBC: 13.2 10*3/uL — ABNORMAL HIGH (ref 4.0–10.5)
nRBC: 0.2 % (ref 0.0–0.2)

## 2021-03-26 LAB — POCT I-STAT 7, (LYTES, BLD GAS, ICA,H+H)
Acid-base deficit: 1 mmol/L (ref 0.0–2.0)
Acid-base deficit: 1 mmol/L (ref 0.0–2.0)
Acid-base deficit: 1 mmol/L (ref 0.0–2.0)
Bicarbonate: 23.9 mmol/L (ref 20.0–28.0)
Bicarbonate: 24 mmol/L (ref 20.0–28.0)
Bicarbonate: 24.1 mmol/L (ref 20.0–28.0)
Calcium, Ion: 1.09 mmol/L — ABNORMAL LOW (ref 1.15–1.40)
Calcium, Ion: 1.1 mmol/L — ABNORMAL LOW (ref 1.15–1.40)
Calcium, Ion: 1.11 mmol/L — ABNORMAL LOW (ref 1.15–1.40)
HCT: 26 % — ABNORMAL LOW (ref 36.0–46.0)
HCT: 27 % — ABNORMAL LOW (ref 36.0–46.0)
HCT: 26 % — ABNORMAL LOW (ref 36.0–46.0)
Hemoglobin: 8.8 g/dL — ABNORMAL LOW (ref 12.0–15.0)
Hemoglobin: 9.2 g/dL — ABNORMAL LOW (ref 12.0–15.0)
Hemoglobin: 8.8 g/dL — ABNORMAL LOW (ref 12.0–15.0)
O2 Saturation: 97 %
O2 Saturation: 95 %
O2 Saturation: 94 %
Patient temperature: 37.2
Patient temperature: 37.2
Patient temperature: 37.2
Potassium: 4.3 mmol/L (ref 3.5–5.1)
Potassium: 4.5 mmol/L (ref 3.5–5.1)
Potassium: 4.3 mmol/L (ref 3.5–5.1)
Sodium: 140 mmol/L (ref 135–145)
Sodium: 141 mmol/L (ref 135–145)
Sodium: 141 mmol/L (ref 135–145)
TCO2: 25 mmol/L (ref 22–32)
TCO2: 25 mmol/L (ref 22–32)
TCO2: 25 mmol/L (ref 22–32)
pCO2 arterial: 40 mmHg (ref 32.0–48.0)
pCO2 arterial: 39.3 mmHg (ref 32.0–48.0)
pCO2 arterial: 41.8 mmHg (ref 32.0–48.0)
pH, Arterial: 7.384 (ref 7.350–7.450)
pH, Arterial: 7.395 (ref 7.350–7.450)
pH, Arterial: 7.37 (ref 7.350–7.450)
pO2, Arterial: 89 mmHg (ref 83.0–108.0)
pO2, Arterial: 76 mmHg — ABNORMAL LOW (ref 83.0–108.0)
pO2, Arterial: 76 mmHg — ABNORMAL LOW (ref 83.0–108.0)

## 2021-03-26 LAB — BASIC METABOLIC PANEL
Anion gap: 8 (ref 5–15)
Anion gap: 10 (ref 5–15)
BUN: 27 mg/dL — ABNORMAL HIGH (ref 6–20)
BUN: 30 mg/dL — ABNORMAL HIGH (ref 6–20)
CO2: 25 mmol/L (ref 22–32)
CO2: 23 mmol/L (ref 22–32)
Calcium: 7.8 mg/dL — ABNORMAL LOW (ref 8.9–10.3)
Calcium: 8.1 mg/dL — ABNORMAL LOW (ref 8.9–10.3)
Chloride: 106 mmol/L (ref 98–111)
Chloride: 105 mmol/L (ref 98–111)
Creatinine, Ser: 2.3 mg/dL — ABNORMAL HIGH (ref 0.44–1.00)
Creatinine, Ser: 2.47 mg/dL — ABNORMAL HIGH (ref 0.44–1.00)
GFR, Estimated: 24 mL/min — ABNORMAL LOW (ref >60)
GFR, Estimated: 22 mL/min — ABNORMAL LOW (ref >60)
Glucose, Bld: 149 mg/dL — ABNORMAL HIGH (ref 70–99)
Glucose, Bld: 164 mg/dL — ABNORMAL HIGH (ref 70–99)
Potassium: 4.4 mmol/L (ref 3.5–5.1)
Potassium: 4.2 mmol/L (ref 3.5–5.1)
Sodium: 139 mmol/L (ref 135–145)
Sodium: 138 mmol/L (ref 135–145)

## 2021-03-26 LAB — BPAM FFP
Blood Product Expiration Date: 202212212359
Blood Product Expiration Date: 202212232359
Blood Product Expiration Date: 202212232359
Blood Product Expiration Date: 202212232359
ISSUE DATE / TIME: 202212190225
ISSUE DATE / TIME: 202212190225
ISSUE DATE / TIME: 202212190225
ISSUE DATE / TIME: 202212190225
Unit Type and Rh: 600
Unit Type and Rh: 6200
Unit Type and Rh: 6200
Unit Type and Rh: 6200

## 2021-03-26 LAB — BPAM PLATELET PHERESIS
Blood Product Expiration Date: 202212202359
Blood Product Expiration Date: 202212202359
ISSUE DATE / TIME: 202212190226
ISSUE DATE / TIME: 202212190226
Unit Type and Rh: 5100
Unit Type and Rh: 6200

## 2021-03-26 LAB — PREPARE CRYOPRECIPITATE
Unit division: 0
Unit division: 0

## 2021-03-26 LAB — BPAM CRYOPRECIPITATE
Blood Product Expiration Date: 202212190823
Blood Product Expiration Date: 202212190823
ISSUE DATE / TIME: 202212190241
ISSUE DATE / TIME: 202212190241
Unit Type and Rh: 6200
Unit Type and Rh: 6200

## 2021-03-26 LAB — CBC
HCT: 28.3 % — ABNORMAL LOW (ref 36.0–46.0)
Hemoglobin: 9 g/dL — ABNORMAL LOW (ref 12.0–15.0)
MCH: 31.7 pg (ref 26.0–34.0)
MCHC: 31.8 g/dL (ref 30.0–36.0)
MCV: 99.6 fL (ref 80.0–100.0)
Platelets: 113 10*3/uL — ABNORMAL LOW (ref 150–400)
RBC: 2.84 MIL/uL — ABNORMAL LOW (ref 3.87–5.11)
RDW: 21.2 % — ABNORMAL HIGH (ref 11.5–15.5)
WBC: 16.1 10*3/uL — ABNORMAL HIGH (ref 4.0–10.5)
nRBC: 0.1 % (ref 0.0–0.2)

## 2021-03-26 LAB — PREPARE PLATELET PHERESIS
Unit division: 0
Unit division: 0

## 2021-03-26 LAB — SURGICAL PATHOLOGY

## 2021-03-26 LAB — MAGNESIUM
Magnesium: 2.4 mg/dL (ref 1.7–2.4)
Magnesium: 2.5 mg/dL — ABNORMAL HIGH (ref 1.7–2.4)

## 2021-03-26 MED ORDER — METOPROLOL TARTRATE 25 MG/10 ML ORAL SUSPENSION: 25.0000 mg | Freq: Two times a day (BID) | ORAL | Status: DC

## 2021-03-26 MED ORDER — DOCUSATE SODIUM 100 MG PO CAPS
200.0000 mg | ORAL_CAPSULE | Freq: Every day | ORAL | Status: DC
Start: 2021-03-26 — End: 2021-04-02
  Administered 2021-03-26 – 2021-04-02 (×8): 200 mg via ORAL
  Filled 2021-03-26 (×8): qty 2

## 2021-03-26 MED ORDER — PANTOPRAZOLE SODIUM 40 MG PO TBEC
40.0000 mg | DELAYED_RELEASE_TABLET | Freq: Every day | ORAL | Status: DC
  Administered 2021-03-26 – 2021-04-02 (×8): 40 mg via ORAL
  Filled 2021-03-26 (×8): qty 1

## 2021-03-26 MED ORDER — METOPROLOL TARTRATE 12.5 MG HALF TABLET
12.5000 mg | ORAL_TABLET | Freq: Two times a day (BID) | ORAL | Status: DC
  Administered 2021-03-26 – 2021-03-28 (×5): 12.5 mg via ORAL
  Filled 2021-03-26 (×5): qty 1

## 2021-03-26 MED ORDER — FUROSEMIDE 10 MG/ML IJ SOLN
40.0000 mg | Freq: Once | INTRAMUSCULAR | Status: AC
  Administered 2021-03-26: 17:00:00 40 mg via INTRAVENOUS
  Filled 2021-03-26: qty 4

## 2021-03-26 MED ORDER — METOPROLOL TARTRATE 25 MG/10 ML ORAL SUSPENSION: 12.5000 mg | Freq: Two times a day (BID) | ORAL | Status: DC

## 2021-03-26 MED ORDER — SODIUM CHLORIDE 0.9% FLUSH: 10.0000 mL | INTRAVENOUS | Status: DC | PRN

## 2021-03-26 MED ORDER — METOPROLOL TARTRATE 25 MG PO TABS: 25.0000 mg | ORAL_TABLET | Freq: Two times a day (BID) | ORAL | Status: DC

## 2021-03-26 MED ORDER — ORAL CARE MOUTH RINSE
15.0000 mL | Freq: Two times a day (BID) | OROMUCOSAL | Status: DC
  Administered 2021-03-26 – 2021-04-01 (×13): 15 mL via OROMUCOSAL

## 2021-03-26 MED ORDER — INSULIN ASPART 100 UNIT/ML IJ SOLN
0.0000 [IU] | INTRAMUSCULAR | Status: DC
Start: 2021-03-26 — End: 2021-04-01
  Administered 2021-03-26 (×4): 2 [IU] via SUBCUTANEOUS
  Administered 2021-03-26 – 2021-03-27 (×2): 4 [IU] via SUBCUTANEOUS
  Administered 2021-03-27 – 2021-03-30 (×12): 2 [IU] via SUBCUTANEOUS
  Administered 2021-03-30: 12:00:00 4 [IU] via SUBCUTANEOUS
  Administered 2021-03-30 – 2021-04-01 (×7): 2 [IU] via SUBCUTANEOUS

## 2021-03-26 MED ORDER — SODIUM CHLORIDE 0.9% FLUSH
10.0000 mL | Freq: Two times a day (BID) | INTRAVENOUS | Status: DC
  Administered 2021-03-26 – 2021-04-03 (×11): 10 mL
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
## 2021-03-26 NOTE — Progress Notes (Signed)
Pt extubated to 4L/[place]. With good effort pt did: NIF = -28 cm H20  VC = 0.9L/min Pt able to voice her name, No stridor noted.

## 2021-03-26 NOTE — Progress Notes (Signed)
[address]       [place] [address]             [phone]                 2 Days Post-Op Procedure(s) (LRB): 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 USING HEMASHIELD PLATINUM  [id number] mm (N/A) INTRAOPERATIVE TRANSESOPHAGEAL ECHOCARDIOGRAM APPLICATION OF CELL SAVER   Events: Failed extubation last night _______________________________________________________________ Vitals: BP 107/78    Pulse 80    Temp 99 F (37.2 C)    Resp 13    Ht $R'5\' 7"'gt$  (1.702 m)    Wt 101.8 kg    SpO2 100%    BMI 35.15 kg/m  Filed Weights   03/24/21 1114 03/26/21 0600  Weight: 79.4 kg 101.8 kg     - Neuro: sedated  - Cardiovascular: sinus in 60s  Drips: cardene 2.5.   PAP: (22-46)/(6-23) 27/16 CO:  [3.2 L/min-5.2 L/min] 4.4 L/min CI:  [1.7 L/min/m2-2.7 L/min/m2] 2.3 L/min/m2  - Pulm:  Vent Mode: SIMV;PRVC;PSV FiO2 (%):  [40 %-60 %] 40 % Set Rate:  [4 bmp-12 bmp] 12 bmp Vt Set:  [490 mL] 490 mL PEEP:  [5 cmH20-8 cmH20] 8 cmH20 Pressure Support:  [10 cmH20] 10 cmH20 Plateau Pressure:  [19 cmH20-23 cmH20] 21 cmH20  ABG    Component Value Date/Time   PHART 7.384 03/26/2021 0702   PCO2ART 40.0 03/26/2021 0702   PO2ART 89 03/26/2021 0702   HCO3 23.9 03/26/2021 0702   TCO2 25 03/26/2021 0702   ACIDBASEDEF 1.0 03/26/2021 0702   O2SAT 97.0 03/26/2021 0702    - Abd: ND - Extremity: warm  .Intake/Output      12/19 0701 12/20 0700 12/20 0701 12/21 0700   P.Dorothy.     I.V. (mL/kg) 2437.7 (23.9)    Blood 315    NG/GT 120    IV Piggyback 606.5    Total Intake(mL/kg) 3479.2 (34.2)    Urine (mL/kg/hr) 2440 (1)    Emesis/NG output     Blood     Chest Tube 580    Total Output 3020    Net +459.2            _______________________________________________________________ Labs: CBC Latest Ref Rng & Units 03/26/2021 03/26/2021 03/25/2021  WBC 4.0 - 10.5 K/uL - 13.2(H) -  Hemoglobin 12.0 - 15.0 g/dL 8.8(L) 8.8(L) 9.5(L)  Hematocrit 36.0 - 46.0 %  26.0(L) 26.4(L) 28.0(L)  Platelets 150 - 400 K/uL - 95(L) -   CMP Latest Ref Rng & Units 03/26/2021 03/26/2021 03/25/2021  Glucose 70 - 99 mg/dL - 149(H) -  BUN 6 - 20 mg/dL - 27(H) -  Creatinine 0.44 - 1.00 mg/dL - 2.30(H) -  Sodium 135 - 145 mmol/L 140 139 140  Potassium 3.5 - 5.1 mmol/L 4.3 4.4 4.2  Chloride 98 - 111 mmol/L - 106 -  CO2 22 - 32 mmol/L - 25 -  Calcium 8.9 - 10.3 mg/dL - 7.8(L) -  Total Protein 6.1 - 8.1 g/dL - - -  Total Bilirubin 0.2 - 1.2 mg/dL - - -  Alkaline Phos 38 - 126 U/L - - -  AST 10 - 35 U/L - - -  ALT 6 - 29 U/L - - -    CXR: Stable.  PV congestion  _______________________________________________________________  Assessment and Plan: POD 2 s/p type A repair, arch replacement  Neuro: wean sedation CV: wean cardene.  Will diurese and titrate BP meds Pulm: wean  to extaubte Renal: creat up slightly.  Making good urine, gentle diuresis today GI: NPO  Heme: stable ID: [email or web address] Endo: convert to SSI Dispo: continue ICU care   Dorothy Harris Dorothy Harris 03/26/2021 7:22 AM

## 2021-03-26 NOTE — Addendum Note (Signed)
Addendum  created 03/26/21 0956 by Josephine Igo, CRNA   Order list changed, Pharmacy for encounter modified

## 2021-03-26 NOTE — Anesthesia Postprocedure Evaluation (Signed)
Anesthesia Post Note  Patient: ORABELLE RYLEE  Procedure(s) Performed: 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 USING HEMASHIELD PLATINUM  [id number] mm (Chest) INTRAOPERATIVE TRANSESOPHAGEAL ECHOCARDIOGRAM APPLICATION OF CELL SAVER     Patient location during evaluation: SICU Anesthesia Type: General Level of consciousness: sedated Pain management: pain level controlled Vital Signs Assessment: post-procedure vital signs reviewed and stable Respiratory status: patient remains intubated per anesthesia plan Cardiovascular status: stable Postop Assessment: no apparent nausea or vomiting Anesthetic complications: no   No notable events documented.  Last Vitals:  Vitals:   03/26/21 0845 03/26/21 0900  BP:  131/85  Pulse: 63 73  Resp: 13 (!) 25  Temp: 37.4 C 37.3 C  SpO2: 98% 98%    Last Pain:  Vitals:   03/25/21 2000  TempSrc: Core  PainSc:                  Karyl Kinnier Petrita Blunck

## 2021-03-26 NOTE — Progress Notes (Signed)
° °   °  [address]       [place] [address]             [phone]      POD # 1 Repair type I dissection  Extubated earlier today  BP 114/61    Pulse 69    Temp 98.8 F (37.1 C)    Resp (!) 9    Ht $R'5\' 7"'KO$  (1.702 m)    Wt 101.8 kg    SpO2 96%    BMI 35.15 kg/m  6L [place] 96% sat Nicardipine at 3 mg/hr   Intake/Output Summary (Last 24 hours) at 03/26/2021 1825 Last data filed at 03/26/2021 1700 Gross per 24 hour  Intake 2087.96 ml  Output 1375 ml  Net 712.96 ml   Creatinine up to 2.43, K= 4.2 Hct 28  Diurese, BP control  Shylin Keizer C. Roxan Hockey, MD [facility] [phone]

## 2021-03-27 ENCOUNTER — Inpatient Hospital Stay (HOSPITAL_COMMUNITY): Payer: PRIVATE HEALTH INSURANCE

## 2021-03-27 LAB — GLUCOSE, CAPILLARY
Glucose-Capillary: 156 mg/dL — ABNORMAL HIGH (ref 70–99)
Glucose-Capillary: 171 mg/dL — ABNORMAL HIGH (ref 70–99)
Glucose-Capillary: 134 mg/dL — ABNORMAL HIGH (ref 70–99)
Glucose-Capillary: 114 mg/dL — ABNORMAL HIGH (ref 70–99)
Glucose-Capillary: 108 mg/dL — ABNORMAL HIGH (ref 70–99)

## 2021-03-27 LAB — BASIC METABOLIC PANEL
Anion gap: 10 (ref 5–15)
Anion gap: 7 (ref 5–15)
BUN: 33 mg/dL — ABNORMAL HIGH (ref 6–20)
BUN: 34 mg/dL — ABNORMAL HIGH (ref 6–20)
CO2: 22 mmol/L (ref 22–32)
CO2: 24 mmol/L (ref 22–32)
Calcium: 8.1 mg/dL — ABNORMAL LOW (ref 8.9–10.3)
Calcium: 8.2 mg/dL — ABNORMAL LOW (ref 8.9–10.3)
Chloride: 104 mmol/L (ref 98–111)
Chloride: 105 mmol/L (ref 98–111)
Creatinine, Ser: 2.5 mg/dL — ABNORMAL HIGH (ref 0.44–1.00)
Creatinine, Ser: 2.38 mg/dL — ABNORMAL HIGH (ref 0.44–1.00)
GFR, Estimated: 21 mL/min — ABNORMAL LOW (ref >60)
GFR, Estimated: 23 mL/min — ABNORMAL LOW (ref >60)
Glucose, Bld: 146 mg/dL — ABNORMAL HIGH (ref 70–99)
Glucose, Bld: 130 mg/dL — ABNORMAL HIGH (ref 70–99)
Potassium: 4.4 mmol/L (ref 3.5–5.1)
Potassium: 4.1 mmol/L (ref 3.5–5.1)
Sodium: 136 mmol/L (ref 135–145)
Sodium: 136 mmol/L (ref 135–145)

## 2021-03-27 LAB — CBC
HCT: 28.7 % — ABNORMAL LOW (ref 36.0–46.0)
Hemoglobin: 9.2 g/dL — ABNORMAL LOW (ref 12.0–15.0)
MCH: 32.6 pg (ref 26.0–34.0)
MCHC: 32.1 g/dL (ref 30.0–36.0)
MCV: 101.8 fL — ABNORMAL HIGH (ref 80.0–100.0)
Platelets: 114 10*3/uL — ABNORMAL LOW (ref 150–400)
RBC: 2.82 MIL/uL — ABNORMAL LOW (ref 3.87–5.11)
RDW: 20.9 % — ABNORMAL HIGH (ref 11.5–15.5)
WBC: 16.5 10*3/uL — ABNORMAL HIGH (ref 4.0–10.5)
nRBC: 0.1 % (ref 0.0–0.2)

## 2021-03-27 MED ORDER — ENSURE ENLIVE PO LIQD
237.0000 mL | Freq: Two times a day (BID) | ORAL | Status: DC
  Administered 2021-03-30 (×2): 237 mL via ORAL

## 2021-03-27 MED ORDER — FUROSEMIDE 10 MG/ML IJ SOLN
80.0000 mg | Freq: Two times a day (BID) | INTRAMUSCULAR | Status: AC
  Administered 2021-03-27 (×2): 80 mg via INTRAVENOUS
  Filled 2021-03-27 (×2): qty 8

## 2021-03-27 MED ORDER — NICARDIPINE HCL IN NACL 40-0.83 MG/200ML-% IV SOLN
3.0000 mg/h | INTRAVENOUS | Status: DC
  Administered 2021-03-27: 10:00:00 10 mg/h via INTRAVENOUS
  Administered 2021-03-27: 18:00:00 5 mg/h via INTRAVENOUS
  Administered 2021-03-27 – 2021-03-28 (×4): 10 mg/h via INTRAVENOUS
  Administered 2021-03-28: 12:00:00 5 mg/h via INTRAVENOUS
  Administered 2021-03-28 – 2021-03-29 (×2): 7.5 mg/h via INTRAVENOUS
  Administered 2021-03-29 (×2): 10 mg/h via INTRAVENOUS
  Administered 2021-03-30: 03:00:00 2.5 mg/h via INTRAVENOUS
  Filled 2021-03-27 (×13): qty 200

## 2021-03-27 MED ORDER — LIDOCAINE 5 % EX PTCH
1.0000 | MEDICATED_PATCH | CUTANEOUS | Status: DC
  Administered 2021-03-27 – 2021-03-30 (×4): 1 via TRANSDERMAL
  Filled 2021-03-27 (×4): qty 1

## 2021-03-27 MED ORDER — FENTANYL CITRATE PF 50 MCG/ML IJ SOSY
25.0000 ug | PREFILLED_SYRINGE | INTRAMUSCULAR | Status: DC | PRN
  Administered 2021-03-31: 03:00:00 25 ug via INTRAVENOUS
  Filled 2021-03-27: qty 1

## 2021-03-27 MED ORDER — AMLODIPINE BESYLATE 10 MG PO TABS
10.0000 mg | ORAL_TABLET | Freq: Every day | ORAL | Status: DC
  Administered 2021-03-27 – 2021-04-02 (×7): 10 mg via ORAL
  Filled 2021-03-27 (×7): qty 1

## 2021-03-27 MED ORDER — HEPARIN SODIUM (PORCINE) 5000 UNIT/ML IJ SOLN
5000.0000 [IU] | Freq: Three times a day (TID) | INTRAMUSCULAR | Status: DC
  Administered 2021-03-27 – 2021-03-29 (×7): 5000 [IU] via SUBCUTANEOUS
  Filled 2021-03-27 (×7): qty 1

## 2021-03-27 NOTE — Progress Notes (Addendum)
1`TCTS DAILY ICU PROGRESS NOTE                   [address]            [place] [address]          [phone]   3 Days Post-Op Procedure(s) (LRB): 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 USING HEMASHIELD PLATINUM  06D83P5T1A33 mm (N/A) INTRAOPERATIVE TRANSESOPHAGEAL ECHOCARDIOGRAM APPLICATION OF CELL SAVER  Total Length of Stay:  LOS: 3 days   Subjective: Patient sitting in chair. She has complaints of mouth being dry. She states her incisional pain is not "too bad".  Objective: Vital signs in last 24 hours: Temp:  [97.7 F (36.5 C)-99.3 F (37.4 C)] 98.7 F (37.1 C) (12/21 0400) Pulse Rate:  [58-83] 75 (12/21 0700) Cardiac Rhythm: Normal sinus rhythm (12/21 0400) Resp:  [7-25] 19 (12/21 0700) BP: (105-145)/(61-89) 113/75 (12/21 0630) SpO2:  [89 %-100 %] 90 % (12/21 0700) Arterial Line BP: (115-186)/(61-94) 145/74 (12/20 1100) FiO2 (%):  [40 %] 40 % (12/20 0900) Weight:  [99.7 kg] 99.7 kg (12/21 0500)  Filed Weights   03/24/21 1114 03/26/21 0600 03/27/21 0500  Weight: 79.4 kg 101.8 kg 99.7 kg    Weight change: -2.1 kg   Hemodynamic parameters for last 24 hours: PAP: (27-54)/(6-23) 35/20 CO:  [4.4 L/min] 4.4 L/min CI:  [2.3 L/min/m2] 2.3 L/min/m2  Intake/Output from previous day: 12/20 0701 - 12/21 0700 In: 1828.4 [I.V.:1628.4; IV Piggyback:200] Out: 1665 [Urine:1195; Chest Tube:470]  Intake/Output this shift: No intake/output data recorded.  Current Meds: Scheduled Meds:  acetaminophen  1,000 mg Oral Q6H   Or   acetaminophen (TYLENOL) oral liquid 160 mg/5 mL  1,000 mg Per Tube Q6H   aspirin EC  325 mg Oral Daily   Or   aspirin  324 mg Per Tube Daily   bisacodyl  10 mg Oral Daily   Or   bisacodyl  10 mg Rectal Daily   Chlorhexidine Gluconate Cloth  6 each Topical Daily   docusate sodium  200 mg Oral Daily   insulin aspart  0-24 Units Subcutaneous Q4H   mouth rinse  15 mL Mouth Rinse BID   metoprolol tartrate  12.5 mg  Oral BID   Or   metoprolol tartrate  12.5 mg Per Tube BID   pantoprazole  40 mg Oral Daily   sodium chloride flush  10-40 mL Intracatheter Q12H   sodium chloride flush  3 mL Intravenous Q12H   Continuous Infusions:  sodium chloride Stopped (03/26/21 1028)   sodium chloride     sodium chloride 10 mL/hr at 03/25/21 0545   dexmedetomidine (PRECEDEX) IV infusion Stopped (03/26/21 0756)   insulin Stopped (03/26/21 0757)   lactated ringers     lactated ringers     lactated ringers 20 mL/hr at 03/27/21 0600   niCARDipine 7.5 mg/hr (03/27/21 0600)   nitroGLYCERIN 5 mcg/min (03/25/21 1705)   phenylephrine (NEO-SYNEPHRINE) Adult infusion Stopped (03/25/21 0634)   PRN Meds:.sodium chloride, dextrose, hydrALAZINE, lactated ringers, metoprolol tartrate, midazolam, morphine injection, ondansetron (ZOFRAN) IV, oxyCODONE, sodium chloride flush, sodium chloride flush, traMADol  General appearance: alert, cooperative, and no distress Neurologic: intact Heart: RRR, no murmur Lungs: Diminished bibasilar breath sounds L>R Abdomen: Soft, obese, non tender, bowel sounds present Extremities: SCDs in place Wound: Dressings clean and dry.  Lab Results: CBC: Recent Labs    03/26/21 1630 03/27/21 0347  WBC 16.1* 16.5*  HGB 9.0* 9.2*  HCT 28.3* 28.7*  PLT 113* 114*   BMET:  Recent Labs    03/26/21 1630 03/27/21 0347  NA 138 136  K 4.2 4.4  CL 105 104  CO2 23 22  GLUCOSE 164* 146*  BUN 30* 33*  CREATININE 2.47* 2.50*  CALCIUM 8.1* 8.1*    CMET: Lab Results  Component Value Date   WBC 16.5 (H) 03/27/2021   HGB 9.2 (L) 03/27/2021   HCT 28.7 (L) 03/27/2021   PLT 114 (L) 03/27/2021   GLUCOSE 146 (H) 03/27/2021   CHOL 220 (H) 08/21/2017   TRIG 227 (H) 08/21/2017   HDL 50 (L) 08/21/2017   LDLCALC 133 (H) 08/21/2017   ALT 10 08/21/2017   AST 15 08/21/2017   NA 136 03/27/2021   K 4.4 03/27/2021   CL 104 03/27/2021   CREATININE 2.50 (H) 03/27/2021   BUN 33 (H) 03/27/2021   CO2 22  03/27/2021   TSH 0.949 12/23/2015   INR 1.4 (H) 03/25/2021   HGBA1C 6.4 (H) 07/30/2017      PT/INR:  Recent Labs    03/25/21 0538  LABPROT 16.7*  INR 1.4*   Radiology: No results found.   Assessment/Plan: S/P Procedure(s) (LRB): 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 USING HEMASHIELD PLATINUM  22G25K2H0W23 mm (N/A) INTRAOPERATIVE TRANSESOPHAGEAL ECHOCARDIOGRAM APPLICATION OF CELL SAVER CV-SR with HR in the high 60's. On Cardene drip at 7.5 mg/hr and Nitro 5 mcg/min. As discussed with Dr. Kipp Brood, restart Amlodipine 10 mg daily. Pulmonary-on 6 liters of oxygen via [place]. Will wean as able over next few days. Chest tubes with 470 cc last 24 hours. CXR this am appears to show cardiomegaly, left pleural effusion. Chest tubes to remain for now. Encourage incentive spirometer and flutter valve Volume overload-will discuss with Dr. Kipp Brood CBGs 151/144/156. Will check HGA1C. Last HGA1C 2019 was 6.4. No documented history of diabetes. 5. Expected post op blood loss anemia-H and H this am stable at 9.2 and 28.7 6. Mild thrombocytopenia-platelets this am 114.000 7. CKD (stage IV)-creatinine this am 2.5. Creatinine upon admission 1.99. Chronic Kidney Disease   Stage I     GFR >90  Stage II    GFR 60-89  Stage IIIA GFR 45-59  Stage IIIB GFR 30-44  Stage IV   GFR 15-29  Stage V    GFR  <15  Lab Results  Component Value Date   CREATININE 2.50 (H) 03/27/2021   Estimated Creatinine Clearance: 29 mL/min (A) (by C-G formula based on SCr of 2.5 mg/dL (H)).  8. Deconditioned-PT to assist with ambulation  Donielle Liston Alba PA-C 03/27/2021 7:05 AM   Agree with above Weaning gtts Titrating BP meds Will keep [place]

## 2021-03-27 NOTE — Consult Note (Signed)
NAME:  Dorothy Harris, MRN:  [id number], DOB:  13-May-1960, LOS: 3 ADMISSION DATE:  03/24/2021, CONSULTATION DATE:  12/19 REFERRING MD:  Cyndia Bent, CHIEF COMPLAINT:  critical care medicine BP control and complex medical management    History of Present Illness:  This is a 60 year old female w/ hx as per below.. Presented to [facility] 12/18 w/ cc: chest pain and SOB. 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 Started on esmolol, seen by CVTS; went to OR where she underwent repair and replacement of ascending aorta and replacement of aortic arch w/ Hemashield graft. Returned to surgical ICU post op on 12/19. 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  Epigasatric hernia  Significant Hospital Events: Including procedures, antibiotic start and stop dates in addition to other pertinent events   12/18 admitted w/ type A dissection. Underwent: Median Sternotomy Anastomosis of 8 mm Hemashield graft to the right axillary artery. Extracorporeal circulation via the right axillary artery and right atrial appendage Replacement of aortic arch under deep hypothermic circulatory arrest with bypass of the innominate, left common carotid and left subclavian arteries using a 30 x 10 x 8 x 8 x 10 mm Hemashield Platinum graft.  Supra-coronary replacement of the ascending aorta. Cross clamp time of 169 minutes. Pt was noted to be coagulopathic intra-op    Interim History / Subjective:  Remained intubated overnight, slow to wake up. Awake now and following commands.   Objective   Blood pressure (!) 124/91, pulse 64, temperature 97.9 F (36.6 C), temperature source Oral, resp. rate 14, height $RemoveBe'5\' 7"'btCSkZMNB$  (1.702 m), weight 99.7 kg, SpO2 92 %. PAP: (29-54)/(7-23) 35/20   Vent Mode: Stand-by FiO2 (%):  [40 %] 40 % Set Rate:  [4 bmp] 4 bmp PEEP:  [5 cmH20] 5 cmH20 Pressure Support:  [10 cmH20] 10 cmH20   Intake/Output Summary (Last 24 hours) at 03/27/2021 0829 Last data filed at 03/27/2021 0800 Gross per 24 hour  Intake 2016.89 ml  Output 1695 ml  Net 321.89 ml    Filed Weights   03/24/21 1114 03/26/21 0600 03/27/21 0500  Weight: 79.4 kg 101.8 kg 99.7 kg    Examination: General: Precedex off, on cardene HENT:  no skin break down,  Lungs: bronchial breath sounds at both bases.  Cardiovascular: . Has anterior mediastinal tubes w/ min bloody output. Backup rate of 50 bpm, has bilateral radial alines that correlate. Sternal dressing intact. HS distant, no  rub.   CVP 20 Abdomen: soft  Extremities: warm strong pulses, minimal edema.  Neuro: awake and oriented but poor speech and some gaps in insight.   GU: clear yellow   Ancillary tests personally reviewed:  CXR 12/19: poor inspiration, bilateral effusions.  ABG normal this morning.  Creatinine 2.50 (rising).  Plt : 114 (improving)  Assessment & Plan:  S/p AAA repair w/ replacement of ascending aorta and replacement of aortic arch w/ Hemashield graft.  Critically ill due to expected post operative respiratory failure requiring mechanical ventilation.  CKD stage III, at risk for AKI  Post-op anemia H/o poorly controlled HTN with LVH Mild hypoactive delirium   Plan:  - Aggressive chest physiotherapy - Multimodal pain control - add lidocaine patches - Keep SBP< 130, HR <65 -  started amlodipine.  - Keep back-up pacer while beta-blocker initiated.  HR is improving, so should be able to tolerate increased dose.   - Monitor for worsening AKI - avoid NSAIDS  for pain -  Continue to follow CVP as indicator of volume status.  - Will diurese to CVP <12.  - Expected coagulopathy following dissection and CPB run - hold chemical prophylaxis for DVT for now.   Best Practice (right click and  "Reselect all SmartList Selections" daily)   Diet/type: NPO DVT prophylaxis: SCD - start heparin.  GI prophylaxis: PPI Lines: Central line and Arterial Line Foley:  Yes, and it is still needed Code Status:  full code Last date of multidisciplinary goals of care discussion: sister and husband updated at the bedside.   CRITICAL CARE Performed by: Kipp Brood   Total critical care time: 35 minutes  Critical care time was exclusive of separately billable procedures and treating other patients.  Critical care was necessary to treat or prevent imminent or life-threatening deterioration.  Critical care was time spent personally by me on the following activities: development of treatment plan with patient and/or surrogate as well as nursing, discussions with consultants, evaluation of patient's response to treatment, examination of patient, obtaining history from patient or surrogate, ordering and performing treatments and interventions, ordering and review of laboratory studies, ordering and review of radiographic studies, pulse oximetry, re-evaluation of patient's condition and participation in multidisciplinary rounds.  Kipp Brood, MD [facility] ICU Physician [place]  Pager: [phone] Mobile: [phone] After hours: [phone].

## 2021-03-27 NOTE — Plan of Care (Signed)
°  Problem: Clinical Measurements: Goal: Cardiovascular complication will be avoided Outcome: Progressing   Problem: Activity: Goal: Risk for activity intolerance will decrease Outcome: Progressing   Problem: Safety: Goal: Ability to remain free from injury will improve Outcome: Progressing   Problem: Skin Integrity: Goal: Risk for impaired skin integrity will decrease Outcome: Progressing   Problem: Activity: Goal: Risk for activity intolerance will decrease Outcome: Progressing   Problem: Cardiac: Goal: Will achieve and/or maintain hemodynamic stability Outcome: Progressing   Problem: Clinical Measurements: Goal: Respiratory complications will improve Outcome: Not Progressing   Problem: Nutrition: Goal: Adequate nutrition will be maintained Outcome: Not Progressing

## 2021-03-27 NOTE — Progress Notes (Signed)
EVENING ROUNDS NOTE :     [address]       [place] [address]             [phone]                 3 Days Post-Op Procedure(s) (LRB): 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 USING HEMASHIELD PLATINUM  03F54H6G6V70 mm (N/A) INTRAOPERATIVE TRANSESOPHAGEAL ECHOCARDIOGRAM APPLICATION OF CELL SAVER   Total Length of Stay:  LOS: 3 days  Events:   No events Remains on cardene Poor diuresis today    BP 138/82    Pulse 63    Temp 97.6 F (36.4 C) (Oral)    Resp 10    Ht $R'5\' 7"'hl$  (1.702 m)    Wt 99.7 kg    SpO2 95%    BMI 34.43 kg/m   CVP:  [20 mmHg] 20 mmHg      sodium chloride Stopped (03/26/21 1028)   sodium chloride     sodium chloride 10 mL/hr at 03/25/21 0545   lactated ringers     lactated ringers     lactated ringers 10 mL/hr at 03/27/21 1900   niCARDipine 7.5 mg/hr (03/27/21 1900)    I/O last 3 completed shifts: In: 2839.5 [P.O.:240; I.V.:2399.5; IV Piggyback:200] Out: 2770 [Urine:2120; Chest Tube:650]   CBC Latest Ref Rng & Units 03/27/2021 03/26/2021 03/26/2021  WBC 4.0 - 10.5 K/uL 16.5(H) 16.1(H) -  Hemoglobin 12.0 - 15.0 g/dL 9.2(L) 9.0(L) 8.8(L)  Hematocrit 36.0 - 46.0 % 28.7(L) 28.3(L) 26.0(L)  Platelets 150 - 400 K/uL 114(L) 113(L) -    BMP Latest Ref Rng & Units 03/27/2021 03/27/2021 03/26/2021  Glucose 70 - 99 mg/dL 130(H) 146(H) 164(H)  BUN 6 - 20 mg/dL 34(H) 33(H) 30(H)  Creatinine 0.44 - 1.00 mg/dL 2.38(H) 2.50(H) 2.47(H)  BUN/Creat Ratio 6 - 22 (calc) - - -  Sodium 135 - 145 mmol/L 136 136 138  Potassium 3.5 - 5.1 mmol/L 4.1 4.4 4.2  Chloride 98 - 111 mmol/L 105 104 105  CO2 22 - 32 mmol/L $RemoveB'24 22 23  'FzzsptPR$ Calcium 8.9 - 10.3 mg/dL 8.2(L) 8.1(L) 8.1(L)    ABG    Component Value Date/Time   PHART 7.370 03/26/2021 1038   PCO2ART 41.8 03/26/2021 1038   PO2ART 76 (L) 03/26/2021 1038   HCO3 24.1 03/26/2021 1038   TCO2 25 03/26/2021 1038   ACIDBASEDEF 1.0 03/26/2021 1038   O2SAT 94.0 03/26/2021 [place], MD 03/27/2021 8:24 PM

## 2021-03-28 ENCOUNTER — Inpatient Hospital Stay (HOSPITAL_COMMUNITY): Payer: PRIVATE HEALTH INSURANCE

## 2021-03-28 DIAGNOSIS — N179 Acute kidney failure, unspecified: Secondary | ICD-10-CM

## 2021-03-28 DIAGNOSIS — K659 Peritonitis, unspecified: Secondary | ICD-10-CM

## 2021-03-28 LAB — TYPE AND SCREEN
ABO/RH(D): A POS
Antibody Screen: NEGATIVE
Unit division: 0
Unit division: 0
Unit division: 0
Unit division: 0
Unit division: 0
Unit division: 0
Unit division: 0
Unit division: 0
Unit division: 0
Unit division: 0
Unit division: 0

## 2021-03-28 LAB — BPAM RBC
Blood Product Expiration Date: 202212302359
Blood Product Expiration Date: 202212302359
Blood Product Expiration Date: 202212312359
Blood Product Expiration Date: 202212312359
Blood Product Expiration Date: 202212312359
Blood Product Expiration Date: 202212312359
Blood Product Expiration Date: 202212312359
Blood Product Expiration Date: 202212312359
Blood Product Expiration Date: 202301142359
Blood Product Expiration Date: 202301142359
Blood Product Expiration Date: 202301142359
Blood Product Expiration Date: 202301162359
ISSUE DATE / TIME: 202212181804
ISSUE DATE / TIME: 202212181804
ISSUE DATE / TIME: 202212181804
ISSUE DATE / TIME: 202212181804
ISSUE DATE / TIME: 202212182021
ISSUE DATE / TIME: 202212182021
ISSUE DATE / TIME: 202212182021
ISSUE DATE / TIME: 202212190604
Unit Type and Rh: 5100
Unit Type and Rh: 5100
Unit Type and Rh: 5100
Unit Type and Rh: 5100
Unit Type and Rh: 6200
Unit Type and Rh: 6200
Unit Type and Rh: 6200
Unit Type and Rh: 6200
Unit Type and Rh: 6200
Unit Type and Rh: 6200
Unit Type and Rh: 6200
Unit Type and Rh: 6200

## 2021-03-28 LAB — GLUCOSE, CAPILLARY
Glucose-Capillary: 114 mg/dL — ABNORMAL HIGH (ref 70–99)
Glucose-Capillary: 122 mg/dL — ABNORMAL HIGH (ref 70–99)
Glucose-Capillary: 126 mg/dL — ABNORMAL HIGH (ref 70–99)
Glucose-Capillary: 128 mg/dL — ABNORMAL HIGH (ref 70–99)
Glucose-Capillary: 132 mg/dL — ABNORMAL HIGH (ref 70–99)
Glucose-Capillary: 137 mg/dL — ABNORMAL HIGH (ref 70–99)
Glucose-Capillary: 149 mg/dL — ABNORMAL HIGH (ref 70–99)

## 2021-03-28 LAB — MAGNESIUM: Magnesium: 2.5 mg/dL — ABNORMAL HIGH (ref 1.7–2.4)

## 2021-03-28 LAB — CBC
HCT: 29.2 % — ABNORMAL LOW (ref 36.0–46.0)
Hemoglobin: 9.1 g/dL — ABNORMAL LOW (ref 12.0–15.0)
MCH: 31.8 pg (ref 26.0–34.0)
MCHC: 31.2 g/dL (ref 30.0–36.0)
MCV: 102.1 fL — ABNORMAL HIGH (ref 80.0–100.0)
Platelets: 122 10*3/uL — ABNORMAL LOW (ref 150–400)
RBC: 2.86 MIL/uL — ABNORMAL LOW (ref 3.87–5.11)
RDW: 20.2 % — ABNORMAL HIGH (ref 11.5–15.5)
WBC: 13.6 10*3/uL — ABNORMAL HIGH (ref 4.0–10.5)
nRBC: 0.1 % (ref 0.0–0.2)

## 2021-03-28 LAB — BASIC METABOLIC PANEL
Anion gap: 8 (ref 5–15)
BUN: 36 mg/dL — ABNORMAL HIGH (ref 6–20)
CO2: 23 mmol/L (ref 22–32)
Calcium: 8.2 mg/dL — ABNORMAL LOW (ref 8.9–10.3)
Chloride: 104 mmol/L (ref 98–111)
Creatinine, Ser: 2.48 mg/dL — ABNORMAL HIGH (ref 0.44–1.00)
GFR, Estimated: 22 mL/min — ABNORMAL LOW (ref >60)
Glucose, Bld: 130 mg/dL — ABNORMAL HIGH (ref 70–99)
Potassium: 4.2 mmol/L (ref 3.5–5.1)
Sodium: 135 mmol/L (ref 135–145)

## 2021-03-28 LAB — HEMOGLOBIN A1C
Hgb A1c MFr Bld: 5.9 % — ABNORMAL HIGH (ref 4.8–5.6)
Mean Plasma Glucose: 122.63 mg/dL

## 2021-03-28 LAB — BRAIN NATRIURETIC PEPTIDE: B Natriuretic Peptide: 898.7 pg/mL — ABNORMAL HIGH (ref 0.0–100.0)

## 2021-03-28 MED ORDER — HYDRALAZINE HCL 20 MG/ML IJ SOLN
20.0000 mg | INTRAMUSCULAR | Status: DC | PRN
  Administered 2021-03-28: 11:00:00 20 mg via INTRAVENOUS
  Filled 2021-03-28: qty 1

## 2021-03-28 MED ORDER — FUROSEMIDE 10 MG/ML IJ SOLN
80.0000 mg | Freq: Two times a day (BID) | INTRAMUSCULAR | Status: AC
  Administered 2021-03-28 – 2021-03-30 (×4): 80 mg via INTRAVENOUS
  Filled 2021-03-28 (×4): qty 8

## 2021-03-28 MED ORDER — METOPROLOL TARTRATE 25 MG PO TABS
25.0000 mg | ORAL_TABLET | Freq: Two times a day (BID) | ORAL | Status: DC
  Administered 2021-03-28: 21:00:00 25 mg via ORAL
  Filled 2021-03-28: qty 1

## 2021-03-28 MED ORDER — FUROSEMIDE 10 MG/ML IJ SOLN
80.0000 mg | Freq: Once | INTRAMUSCULAR | Status: AC
  Administered 2021-03-28: 11:00:00 80 mg via INTRAVENOUS
  Filled 2021-03-28: qty 8

## 2021-03-28 NOTE — TOC Initial Note (Addendum)
Transition of Care [facility]) - Initial/Assessment Note    Patient Details  Name: Dorothy Harris MRN: [id number] Date of Birth: [birth year]  Transition of Care [facility]) CM/SW Contact:    Bethann Berkshire, [place] Phone Number: 03/28/2021, 12:54 PM  Clinical Narrative:                  CSW met with pt to discuss SNF recommendation. Pt reports she lives in [place] alone; no family locally but has family in [place]. Pt is agreeable to SNF. She requests CSW update her sister Dorothy Harris. Pt reports she is not covid vaccinated. CSW will complete FL2 and fax bed requests in hub.   CSW called and updated pt's sister. Sister requested possible facilities in [place], [facility] where she lives. CSW provided her medicare website and instructions to identify facilities she would like in her area. CSW also discussed common limitations of insurance coverage of transport services at that distance.    Expected Discharge Plan: Skilled Nursing Facility Barriers to Discharge: Continued Medical Work up   Patient Goals and CMS Choice Patient states their goals for this hospitalization and ongoing recovery are:: Agreeable to SNF      Expected Discharge Plan and Services Expected Discharge Plan: [place]       Living arrangements for the past 2 months: Single Family Home                                      Prior Living Arrangements/Services Living arrangements for the past 2 months: Single Family Home Lives with:: Self          Need for Family Participation in Patient Care: No (Comment) Care giver support system in place?: No (comment)      Activities of Daily Living Home Assistive Devices/Equipment: None ADL Screening (condition at time of admission) Patient's cognitive ability adequate to safely complete daily activities?: Yes Is the patient deaf or have difficulty hearing?: No Does the patient have difficulty seeing, even when wearing glasses/contacts?: No Does  the patient have difficulty concentrating, remembering, or making decisions?: No Patient able to express need for assistance with ADLs?: Yes Does the patient have difficulty dressing or bathing?: No Independently performs ADLs?: Yes (appropriate for developmental age) Does the patient have difficulty walking or climbing stairs?: No Weakness of Legs: None Weakness of Arms/Hands: None  Permission Sought/Granted   Permission granted to share information with : Yes, Verbal Permission Granted  Share Information with NAME: Dorothy Harris [phone]           Emotional Assessment Appearance:: Appears stated age Attitude/Demeanor/Rapport: Lethargic Affect (typically observed): Accepting Orientation: : Oriented to Self, Oriented to Place, Oriented to  Time, Oriented to Situation Alcohol / Substance Use: Not Applicable Psych Involvement: No (comment)  Admission diagnosis:  Dissection of thoracic aorta, unspecified part [I71.019] Status post surgery [Z98.890] S/P ascending aortic replacement [Z95.828] Patient Active Problem List   Diagnosis Date Noted   S/P ascending aortic replacement 03/25/2021   Encounter for weaning from ventilator [facility])    Status post surgery 03/24/2021   Tobacco abuse 07/30/2017   History of drug use 07/30/2017   Influenza, pneumonia    HTN, goal below 140/90 05/31/2017   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   Chest pain 05/31/2017   Aortic dissection, thoracic 05/31/2017   Coronary artery disease due to lipid rich  plaque    Acute diastolic heart failure (HCC)    Acute coronary syndrome ([place]) 12/23/2015   NSTEMI (non-ST elevated myocardial infarction) ([place]) 12/22/2015   PCP:  No primary care provider on file. Pharmacy:   [facility], [place] [address] Phone: [phone] Fax: [phone]     Social Determinants of  Health (SDOH) Interventions    Readmission Risk Interventions No flowsheet data found.

## 2021-03-28 NOTE — NC FL2 (Signed)
[place] LEVEL OF CARE SCREENING TOOL     IDENTIFICATION  Patient Name: Dorothy Harris Birthdate: 02-18-1961 Sex: female Admission Date (Current Location): 03/24/2021  [facility] and [place] Number:  Herbalist and Address:  [facility], [place] [address], [place], [place] [address]      Provider Number: [id number]  Attending Physician Name and Address:  Gaye Pollack, MD  Relative Name and Phone Number:  Juluis Mire [phone]    Current Level of Care: Hospital Recommended Level of Care: [place] Prior Approval Number:    Date Approved/Denied:   PASRR Number: [id number]  Discharge Plan: SNF    Current Diagnoses: Patient Active Problem List   Diagnosis Date Noted   S/P ascending aortic replacement 03/25/2021   Encounter for weaning from ventilator [facility])    Status post surgery 03/24/2021   Tobacco abuse 07/30/2017   History of drug use 07/30/2017   Influenza, pneumonia    HTN, goal below 140/90 05/31/2017   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   Chest pain 05/31/2017   Aortic dissection, thoracic 05/31/2017   Coronary artery disease due to lipid rich plaque    Acute diastolic heart failure (HCC)    Acute coronary syndrome ([place]) 12/23/2015   NSTEMI (non-ST elevated myocardial infarction) ([place]) 12/22/2015    Orientation RESPIRATION BLADDER Height & Weight     Self, Time, Situation, Place  O2 Indwelling catheter Weight: 214 lb 15.2 oz (97.5 kg) Height:  $Remove'5\' 7"'[id number]$  (170.2 cm)  BEHAVIORAL SYMPTOMS/MOOD NEUROLOGICAL BOWEL NUTRITION STATUS      Continent Diet (See d/c summary)  AMBULATORY STATUS COMMUNICATION OF NEEDS Skin   Extensive Assist Verbally Surgical wounds (Incision chest)                       Personal Care Assistance Level of Assistance  Bathing, Dressing, Feeding Bathing Assistance: Maximum assistance Feeding  assistance: Independent Dressing Assistance: Maximum assistance     Functional Limitations Info  Sight, Hearing, Speech Sight Info: Adequate Hearing Info: Adequate Speech Info: Adequate    SPECIAL CARE FACTORS FREQUENCY  PT (By licensed PT), OT (By licensed OT)     PT Frequency: 5x/week OT Frequency: 5x/week            Contractures Contractures Info: Not present    Additional Factors Info  Code Status, Allergies Code Status Info: Full code Allergies Info: no known allergies           Current Medications (03/28/2021):  This is the current hospital active medication list Current Facility-Administered Medications  Medication Dose Route Frequency Provider Last Rate Last Admin   0.45 % sodium chloride infusion   Intravenous Continuous PRN Barrett, Erin R, PA-C   Stopped at 03/26/21 1028   0.9 %  sodium chloride infusion  250 mL Intravenous Continuous Barrett, Erin R, PA-C       0.9 %  sodium chloride infusion   Intravenous Continuous Barrett, Erin R, PA-C 10 mL/hr at 03/25/21 0545 New Bag at 03/25/21 0545   acetaminophen (TYLENOL) tablet 1,000 mg  1,000 mg Oral Q6H Barrett, Erin R, PA-C   1,000 mg at 03/28/21 1140   Or   acetaminophen (TYLENOL) 160 MG/5ML solution 1,000 mg  1,000 mg Per Tube Q6H Barrett, Erin R, PA-C   1,000 mg at 03/26/21 0601   amLODipine (NORVASC) tablet 10 mg  10 mg Oral Daily Lars Pinks  M, PA-C   10 mg at 03/28/21 0722   aspirin EC tablet 325 mg  325 mg Oral Daily Barrett, Erin R, PA-C   325 mg at 03/28/21 5750   Or   aspirin chewable tablet 324 mg  324 mg Per Tube Daily Barrett, Erin R, PA-C       bisacodyl (DULCOLAX) EC tablet 10 mg  10 mg Oral Daily Barrett, Erin R, PA-C   10 mg at 03/28/21 5183   Or   bisacodyl (DULCOLAX) suppository 10 mg  10 mg Rectal Daily Barrett, Erin R, PA-C       Chlorhexidine Gluconate Cloth 2 % PADS 6 each  6 each Topical Daily Gaye Pollack, MD   6 each at 03/28/21 0530   docusate sodium (COLACE) capsule 200  mg  200 mg Oral Daily Gaye Pollack, MD   200 mg at 03/28/21 0956   feeding supplement (ENSURE ENLIVE / ENSURE PLUS) liquid 237 mL  237 mL Oral BID BM Gaye Pollack, MD       fentaNYL (SUBLIMAZE) injection 25 mcg  25 mcg Intravenous Q2H PRN Kipp Brood, MD       furosemide (LASIX) injection 80 mg  80 mg Intravenous BID Kipp Brood, MD       heparin injection 5,000 Units  5,000 Units Subcutaneous Q8H Kipp Brood, MD   5,000 Units at 03/28/21 0607   hydrALAZINE (APRESOLINE) injection 20 mg  20 mg Intravenous Q4H PRN Kipp Brood, MD   20 mg at 03/28/21 1050   insulin aspart (novoLOG) injection 0-24 Units  0-24 Units Subcutaneous Q4H Lajuana Matte, MD   2 Units at 03/28/21 1141   lactated ringers infusion 500 mL  500 mL Intravenous Once PRN Barrett, Erin R, PA-C       lactated ringers infusion   Intravenous Continuous Barrett, Erin R, PA-C       lactated ringers infusion   Intravenous Continuous Barrett, Erin R, PA-C 10 mL/hr at 03/28/21 1200 Infusion Verify at 03/28/21 1200   lidocaine (LIDODERM) 5 % 1 patch  1 patch Transdermal Q24H Kipp Brood, MD   1 patch at 03/28/21 0956   MEDLINE mouth rinse  15 mL Mouth Rinse BID Lajuana Matte, MD   15 mL at 03/28/21 1057   metoprolol tartrate (LOPRESSOR) injection 2.5-5 mg  2.5-5 mg Intravenous Q2H PRN Barrett, Erin R, PA-C       metoprolol tartrate (LOPRESSOR) tablet 25 mg  25 mg Oral BID Kipp Brood, MD       midazolam (VERSED) injection 2 mg  2 mg Intravenous Q1H PRN Barrett, Erin R, PA-C   2 mg at 03/25/21 0808   nicardipine (CARDENE) 40mg  in 0.83% saline 231ml IV DOUBLE STRENGTH infusion (0.2 mg/ml)  3-15 mg/hr Intravenous Continuous Gaye Pollack, MD 25 mL/hr at 03/28/21 1212 5 mg/hr at 03/28/21 1212   ondansetron (ZOFRAN) injection 4 mg  4 mg Intravenous Q6H PRN Barrett, Erin R, PA-C   4 mg at 03/26/21 1147   oxyCODONE (Oxy IR/ROXICODONE) immediate release tablet 5-10 mg  5-10 mg Oral Q3H PRN Barrett, Erin R, PA-C    5 mg at 03/28/21 1140   pantoprazole (PROTONIX) EC tablet 40 mg  40 mg Oral Daily Gaye Pollack, MD   40 mg at 03/28/21 0955   sodium chloride flush (NS) 0.9 % injection 10-40 mL  10-40 mL Intracatheter Q12H Gaye Pollack, MD   10 mL at 03/27/21 2159   sodium chloride  flush (NS) 0.9 % injection 10-40 mL  10-40 mL Intracatheter PRN Gaye Pollack, MD       sodium chloride flush (NS) 0.9 % injection 3 mL  3 mL Intravenous Q12H Barrett, Erin R, PA-C   3 mL at 03/28/21 1057   sodium chloride flush (NS) 0.9 % injection 3 mL  3 mL Intravenous PRN Barrett, Erin R, PA-C       traMADol (ULTRAM) tablet 50-100 mg  50-100 mg Oral Q4H PRN Barrett, Erin R, PA-C   50 mg at 03/28/21 6415     Discharge Medications: Please see discharge summary for a list of discharge medications.  Relevant Imaging Results:  Relevant Lab Results:   Additional Information SSN [place], [place]

## 2021-03-28 NOTE — Progress Notes (Addendum)
NAME:  Dorothy Harris, MRN:  [id number], DOB:  Nov 05, 1960, LOS: 4 ADMISSION DATE:  03/24/2021, CONSULTATION DATE:  12/19 REFERRING MD:  Cyndia Bent, CHIEF COMPLAINT:  critical care medicine BP control and complex medical management    History of Present Illness:  This is a 60 year old female w/ hx as per below.. Presented to [facility] 12/18 w/ cc: chest pain and SOB. 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 Started on esmolol, seen by CVTS; went to OR where she underwent repair and replacement of ascending aorta and replacement of aortic arch w/ Hemashield graft. Returned to surgical ICU post op on 12/19. 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  Epigasatric hernia  Significant Hospital Events: Including procedures, antibiotic start and stop dates in addition to other pertinent events   12/18 admitted w/ type A dissection. Underwent: Median Sternotomy Anastomosis of 8 mm Hemashield graft to the right axillary artery. Extracorporeal circulation via the right axillary artery and right atrial appendage Replacement of aortic arch under deep hypothermic circulatory arrest with bypass of the innominate, left common carotid and left subclavian arteries using a 30 x 10 x 8 x 8 x 10 mm Hemashield Platinum graft.  Supra-coronary replacement of the ascending aorta. Cross clamp time of 169 minutes. Pt was noted to be coagulopathic intra-op  12/22 remains on cardene for hypertension.   Interim History / Subjective:   Continues to be hypertensive requiring nicardipine infusion.   Objective   Blood pressure 129/86, pulse 67, temperature 98 F (36.7 C), temperature source Oral, resp. rate 11, height $RemoveBe'5\' 7"'[id number]$  (1.702 m), weight 97.5 kg, SpO2 94 %. CVP:  [15  mmHg-19 mmHg] 15 mmHg      Intake/Output Summary (Last 24 hours) at 03/28/2021 1026 Last data filed at 03/28/2021 0800 Gross per 24 hour  Intake 1808.21 ml  Output 2065 ml  Net -256.79 ml    Filed Weights   03/26/21 0600 03/27/21 0500 03/28/21 0500  Weight: 101.8 kg 99.7 kg 97.5 kg    Examination: General: Precedex off, on cardene HENT:  no skin break down,  Lungs: bronchial breath sounds at both bases.  Cardiovascular: . Has anterior mediastinal tubes w/ min bloody output. Backup rate of 50 bpm, has bilateral radial alines that correlate. Sternal dressing intact. HS distant, no  rub.   CVP 15 Abdomen: soft  Extremities: warm strong pulses, minimal edema.  Neuro: awake and oriented but poor speech and some gaps in insight.   GU: clear yellow   Ancillary tests personally reviewed:  CXR 12/19: poor inspiration, vascular congestion.  ABG normal this morning.  Creatinine 248 (stable).  Plt : 122(improving)  Assessment & Plan:  S/p AAA repair w/ replacement of ascending aorta and replacement of aortic arch w/ Hemashield graft.  Critically ill due to expected post operative respiratory failure requiring mechanical ventilation.  CKD stage III, at risk for AKI  Post-op anemia H/o poorly controlled HTN with LVH Mild hypoactive delirium   Plan:  - Aggressive chest physiotherapy - Multimodal pain control - add lidocaine patches - Keep SBP< 130, HR <65 - started amlodipine.  - Keep back-up pacer while beta-blocker initiated.  HR is improving, so should be able to tolerate increased dose.  Added  hydralazine - Monitor for worsening AKI - avoid NSAIDS  for pain -  Continue to follow CVP as indicator of volume status.  - Will diurese to CVP <12.  - Expected coagulopathy following dissection and CPB run - start DVT prophylaxis.   Best Practice (right click and "Reselect all SmartList Selections" daily)   Diet/type: NPO DVT prophylaxis: SCD - start heparin.  GI prophylaxis:  PPI Lines: Central line and Arterial Line Foley:  Yes, and it is still needed Code Status:  full code Last date of multidisciplinary goals of care discussion: sister and husband updated at the bedside.   CRITICAL CARE Performed by: Kipp Brood   Total critical care time: 35 minutes  Critical care time was exclusive of separately billable procedures and treating other patients.  Critical care was necessary to treat or prevent imminent or life-threatening deterioration.  Critical care was time spent personally by me on the following activities: development of treatment plan with patient and/or surrogate as well as nursing, discussions with consultants, evaluation of patient's response to treatment, examination of patient, obtaining history from patient or surrogate, ordering and performing treatments and interventions, ordering and review of laboratory studies, ordering and review of radiographic studies, pulse oximetry, re-evaluation of patient's condition and participation in multidisciplinary rounds.  Kipp Brood, MD [facility] ICU Physician [place]  Pager: [phone] Mobile: [phone] After hours: [phone].

## 2021-03-28 NOTE — Plan of Care (Signed)
°  Problem: Pain Managment: Goal: General experience of comfort will improve Outcome: Progressing   Problem: Cardiac: Goal: Will achieve and/or maintain hemodynamic stability Outcome: Progressing   Problem: Skin Integrity: Goal: Wound healing without signs and symptoms of infection Outcome: Progressing   Problem: Nutrition: Goal: Adequate nutrition will be maintained Outcome: Not Progressing Note: Pt refusing meals

## 2021-03-28 NOTE — Evaluation (Signed)
Physical Therapy Evaluation Patient Details Name: Dorothy Harris MRN: [id number] DOB: 21-Feb-1961 Today's Date: 03/28/2021  History of Present Illness  The pt is a 60 yo female presenting 12/18 with c/o chest pain while cleaning house, given 2 NG on arrival with improvement in sx. 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 Extubated 12/20. PMH includes: CAD, NSTEMI, descending thoracic dissection for which pt has not followed up with vascular surgery, HTN, and current tobacco use.   Clinical Impression  Pt in bed upon arrival of PT, agreeable to evaluation at this time. Prior to admission the pt was mobilizing without assist or DME, living alone in a home with 2 steps to enter, and reports independence with ADLs. However, the pt also reports she spends most of her day watching TV in bed and lives a very sedentary lifestyle. The pt now presents with limitations in functional mobility, strength, power, muscular endurance, and stability due to above dx, and will continue to benefit from skilled PT to address these deficits. She currently required modA of 2 to power up while maintaining sternal precautions, and was limited to 4 ft ambulation with RW due to fatigue and onset of dizziness with activity. The pt also required increase in O2 from 7L to 10L with activity to maintain SpO2 >90%.   Will continue to benefit from skilled PT to maximize functional recovery and capacity for OOB mobility, but due to lack of assist and pt current needs for physical assist from 2 people for any mobility, will likely need SNF rehab when medically stable for d/c.   HR: 66-85bpm SpO2: low of 86% on 6L with mobility, boost to 10L with activity, left on 8L end of session with SpO2 95% BP: 142/75 (94) prior to session; 123/92 (98) during mobility; 144/83 (100) resting comfortably in recliner at end of  session        Recommendations for follow up therapy are one component of a multi-disciplinary discharge planning process, led by the attending physician.  Recommendations may be updated based on patient status, additional functional criteria and insurance authorization.  Follow Up Recommendations Skilled nursing-short term rehab (<3 hours/day)    Assistance Recommended at Discharge Frequent or constant Supervision/Assistance  Functional Status Assessment Patient has had a recent decline in their functional status and demonstrates the ability to make significant improvements in function in a reasonable and predictable amount of time.  Equipment Recommendations  Other (comment) (defer to post acute)    Recommendations for Other Services       Precautions / Restrictions Precautions Precautions: Sternal;ICD/Pacemaker;Fall Precaution Comments: chest tube, external pacer, watch O2 (on 6-10L during session) Restrictions Weight Bearing Restrictions: Yes Other Position/Activity Restrictions: sternal precautions      Mobility  Bed Mobility               General bed mobility comments: not assessed as pt OOB in recliner at start and end of session, per RN requires maxA of 2-3    Transfers Overall transfer level: Needs assistance Equipment used: None Transfers: Sit to/from Stand Sit to Stand: Mod assist;+2 physical assistance           General transfer comment: use of momentum and pt holding pillow for sternal precautions. modA of 2 to power up to standing, verbal cues for hip extension to achieve standing    Ambulation/Gait Ambulation/Gait assistance: Mod assist;+2  safety/equipment Gait Distance (Feet): 4 Feet Assistive device: Rolling walker (2 wheels) Gait Pattern/deviations: Step-to pattern;Decreased step length - left;Shuffle Gait velocity: decreased Gait velocity interpretation: <1.31 ft/sec, indicative of household ambulator   General Gait Details: pt with small  shuffling steps with minimal clearance or step length. fatigued and dizzy after walking 4 ft forwards from recliner, required chair brought behind her to rest. O2 increased from 6L to 10L      Balance Overall balance assessment: Needs assistance Sitting-balance support: No upper extremity supported;Feet supported Sitting balance-Leahy Scale: Fair     Standing balance support: Bilateral upper extremity supported;During functional activity Standing balance-Leahy Scale: Poor Standing balance comment: dependent on BUE support                             Pertinent Vitals/Pain Pain Assessment: Faces Faces Pain Scale: Hurts whole lot Pain Location: incision Pain Descriptors / Indicators: Discomfort;Grimacing Pain Intervention(s): Monitored during session;Limited activity within patient's tolerance;Repositioned    Home Living Family/patient expects to be discharged to:: Private residence Living Arrangements: Alone Available Help at Discharge: Friend(s);Available PRN/intermittently;Other (Comment) (pt reports she is unsure of any who could assist at this time) Type of Home: House Home Access: Stairs to enter Entrance Stairs-Rails:  (pt reports she can't remember) Entrance Stairs-Number of Steps: 2   Home Layout: One level Home Equipment: Shower seat Additional Comments: pt states unsure of rail at home or assist available, otherwise not using any DME prior to arrival    Prior Function Prior Level of Function : Independent/Modified Independent             Mobility Comments: pt reports independence without use of DME, limited by fatigue and spends most of her days laying in bed watching TV. states she can mobilize when needed to complete ADLs ADLs Comments: pt reports independence with ADLs and IADLs     Hand Dominance   Dominant Hand: Right    Extremity/Trunk Assessment   Upper Extremity Assessment Upper Extremity Assessment: Generalized weakness (pt with  significant edema bilaterally, and poor ability to move at elbows and shoulders against gravity. reports normal sensation)    Lower Extremity Assessment Lower Extremity Assessment: Generalized weakness (pt with good activation globally, but grossly 3/5 strength in RLE and 4-/5 strength in LLE. pt reports decreased sensation on bottom of her feet bilaterally which is chronic. poor functional power and muscular endurance)    Cervical / Trunk Assessment Cervical / Trunk Assessment: Other exceptions Cervical / Trunk Exceptions: s/p thoracic surgery, large body habitus  Communication   Communication: No difficulties  Cognition Arousal/Alertness: Awake/alert Behavior During Therapy: Flat affect Overall Cognitive Status: Impaired/Different from baseline Area of Impairment: Following commands                       Following Commands: Follows one step commands with increased time       General Comments: pt with delayed response verbally and following instructions. very flat affect with minimal verbal contributions unless directly asked questions.        General Comments General comments (skin integrity, edema, etc.): VSS on 7L O2 prior to session, increased O2 need to 10L with activity due to SpO2 drop to 86%    Exercises General Exercises - Upper Extremity Wrist Flexion: AROM;Both;10 reps;Seated Wrist Extension: AROM;Both;10 reps;Seated Digit Composite Flexion: AROM;Both;10 reps;Seated Composite Extension: AROM;Both;10 reps;Seated General Exercises - Lower Extremity Ankle Circles/Pumps: AROM;Both;10 reps;Seated Quad Sets: AROM;Both;10  reps;Supine   Assessment/Plan    PT Assessment Patient needs continued PT services  PT Problem List Decreased strength;Decreased activity tolerance;Decreased balance;Decreased mobility;Decreased coordination;Pain;Cardiopulmonary status limiting activity       PT Treatment Interventions DME instruction;Gait training;Stair  training;Functional mobility training;Therapeutic activities;Therapeutic exercise;Balance training;Patient/family education    PT Goals (Current goals can be found in the Care Plan section)  Acute Rehab PT Goals Patient Stated Goal: to reduce pain and eventually be able to return home PT Goal Formulation: With patient Time For Goal Achievement: 04/11/21 Potential to Achieve Goals: Good    Frequency Min 3X/week   Barriers to discharge Inaccessible home environment;Decreased caregiver support pt with steps to enter and from home alone with limited assist at d/c. currently needs assist of 2 to manage OOB mobility       AM-PAC PT "6 Clicks" Mobility  Outcome Measure Help needed turning from your back to your side while in a flat bed without using bedrails?: A Lot Help needed moving from lying on your back to sitting on the side of a flat bed without using bedrails?: A Lot Help needed moving to and from a bed to a chair (including a wheelchair)?: Total Help needed standing up from a chair using your arms (e.g., wheelchair or bedside chair)?: Total Help needed to walk in hospital room?: Total Help needed climbing 3-5 steps with a railing? : Total 6 Click Score: 8    End of Session Equipment Utilized During Treatment: Oxygen Activity Tolerance: Patient limited by fatigue (dizziness) Patient left: in chair;with call bell/phone within reach Nurse Communication: Mobility status PT Visit Diagnosis: Other abnormalities of gait and mobility (R26.89);Muscle weakness (generalized) (M62.81)    Time: 9892-1194 PT Time Calculation (min) (ACUTE ONLY): 43 min   Charges:   PT Evaluation $PT Eval Moderate Complexity: 1 Mod PT Treatments $Therapeutic Exercise: 8-22 mins $Therapeutic Activity: 8-22 mins        [place] Carbo, PT, DPT   Acute Rehabilitation Department Pager #: [phone]  Sandra Cockayne 03/28/2021, 9:25 AM

## 2021-03-28 NOTE — Progress Notes (Signed)
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 She has some incisional pain this am. She is dozing on and off during my exam.  Objective: Vital signs in last 24 hours: Temp:  [97.6 F (36.4 C)-98.6 F (37 C)] 98.6 F (37 C) (12/22 0400) Pulse Rate:  [56-86] 74 (12/22 0645) Cardiac Rhythm: Normal sinus rhythm (12/22 0400) Resp:  [8-22] 15 (12/22 0645) BP: (112-155)/(66-94) 128/81 (12/22 0645) SpO2:  [87 %-97 %] 93 % (12/22 0645) Weight:  [97.5 kg] 97.5 kg (12/22 0500)  Filed Weights   03/26/21 0600 03/27/21 0500 03/28/21 0500  Weight: 101.8 kg 99.7 kg 97.5 kg    Weight change: -2.2 kg   Hemodynamic parameters for last 24 hours: CVP:  [15 mmHg-20 mmHg] 15 mmHg  Intake/Output from previous day: 12/21 0701 - 12/22 0700 In: 2086.8 [P.O.:720; I.V.:1366.8] Out: 2085 [Urine:1735; Chest Tube:350]  Intake/Output this shift: Total I/O In: 1075.6 [P.O.:480; I.V.:595.6] Out: 980 [Urine:810; Chest Tube:170]  Current Meds: Scheduled Meds:  acetaminophen  1,000 mg Oral Q6H   Or   acetaminophen (TYLENOL) oral liquid 160 mg/5 mL  1,000 mg Per Tube Q6H   amLODipine  10 mg Oral Daily   aspirin EC  325 mg Oral Daily   Or   aspirin  324 mg Per Tube Daily   bisacodyl  10 mg Oral Daily   Or   bisacodyl  10 mg Rectal Daily   Chlorhexidine Gluconate Cloth  6 each Topical Daily   docusate sodium  200 mg Oral Daily   feeding supplement  237 mL Oral BID BM   heparin  5,000 Units Subcutaneous Q8H   insulin aspart  0-24 Units Subcutaneous Q4H   lidocaine  1 patch Transdermal Q24H   mouth rinse  15 mL Mouth Rinse BID    metoprolol tartrate  12.5 mg Oral BID   Or   metoprolol tartrate  12.5 mg Per Tube BID   pantoprazole  40 mg Oral Daily   sodium chloride flush  10-40 mL Intracatheter Q12H   sodium chloride flush  3 mL Intravenous Q12H   Continuous Infusions:  sodium chloride Stopped (03/26/21 1028)   sodium chloride     sodium chloride 10 mL/hr at 03/25/21 0545   lactated ringers     lactated ringers     lactated ringers 10 mL/hr at 03/28/21 0600   niCARDipine 10 mg/hr (03/28/21 0600)   PRN Meds:.sodium chloride, fentaNYL (SUBLIMAZE) injection, hydrALAZINE, lactated ringers, metoprolol tartrate, midazolam, ondansetron (ZOFRAN) IV, oxyCODONE, sodium chloride flush, sodium chloride flush, traMADol  General appearance: alert, cooperative, and no distress Neurologic: intact Heart: RRR, no murmur Lungs: Diminished bibasilar breath sounds  Abdomen: Soft, obese, non tender, bowel sounds present Extremities: Mild LE edema Wound: Sternal dressing removed and wound has minor bloody ooze midline. No sign of infection. Right axillary wound is clean and dry.  Lab Results: CBC: Recent Labs    03/27/21 0347 03/28/21 0358  WBC 16.5* 13.6*  HGB 9.2* 9.1*  HCT 28.7*  29.2*  PLT 114* 122*    BMET:  Recent Labs    03/27/21 1702 03/28/21 0358  NA 136 135  K 4.1 4.2  CL 105 104  CO2 24 23  GLUCOSE 130* 130*  BUN 34* 36*  CREATININE 2.38* 2.48*  CALCIUM 8.2* 8.2*     CMET: Lab Results  Component Value Date   WBC 13.6 (H) 03/28/2021   HGB 9.1 (L) 03/28/2021   HCT 29.2 (L) 03/28/2021   PLT 122 (L) 03/28/2021   GLUCOSE 130 (H) 03/28/2021   CHOL 220 (H) 08/21/2017   TRIG 227 (H) 08/21/2017   HDL 50 (L) 08/21/2017   LDLCALC 133 (H) 08/21/2017   ALT 10 08/21/2017   AST 15 08/21/2017   NA 135 03/28/2021   K 4.2 03/28/2021   CL 104 03/28/2021   CREATININE 2.48 (H) 03/28/2021   BUN 36 (H) 03/28/2021   CO2 23 03/28/2021   TSH 0.949 12/23/2015   INR 1.4 (H) 03/25/2021   HGBA1C 5.9 (H)  03/28/2021      PT/INR:  No results for input(s): LABPROT, INR in the last 72 hours.  Radiology: No results found.   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 On Cardene drip at 10 mg/hr and Amlodipine 10 mg daily. Lopressor 12.5 mg bid ordered to start today. Will discuss management of hypertension as prior to surgery patient on Coreg, Clonidine, and Hydralazine. Pulmonary-on 6 liters of oxygen via HFNC. Chest tubes with 350 cc last 24 hours. CXR this am appears to show mediastinal widening,cardiomegaly, improving aeration left base. Chest tubes to remain for now. Encourage incentive spirometer and flutter valve Volume overload-She had Lasix 80 mg IV yesterday. will discuss with Dr. Kipp Brood.  CBGs 108/149/137. HGA1C 5.9. Last HGA1C 2019 was 6.4. No documented history of diabetes. Will provide nutrition information with discharge paperwork. She will need further surveillance after discharge with medical doctor.  5. Expected post op blood loss anemia-H and H this am stable at 9.1 and 29.2 6. Mild thrombocytopenia-platelets this am increased to 122,00 7. CKD (stage IV)-creatinine this am 2.48. Creatinine upon admission 1.99. Chronic Kidney Disease   Stage I     GFR >90  Stage II    GFR 60-89  Stage IIIA GFR 45-59  Stage IIIB GFR 30-44  Stage IV   GFR 15-29  Stage V    GFR  <15  Lab Results  Component Value Date   CREATININE 2.48 (H) 03/28/2021   Estimated Creatinine Clearance: 28.9 mL/min (A) (by C-G formula based on SCr of 2.48 mg/dL (H)).  8. Deconditioned-PT to assist with ambulation. PT did not see patient yesterday;will order again. Will also order OT. Patient will likely need CIR/SNF at discharge  Nani Skillern PA-C 03/28/2021 6:59 AM

## 2021-03-28 NOTE — Plan of Care (Signed)
°  Problem: Clinical Measurements: Goal: Cardiovascular complication will be avoided Outcome: Progressing   Problem: Coping: Goal: Level of anxiety will decrease Outcome: Progressing   Problem: Elimination: Goal: Will not experience complications related to urinary retention Outcome: Progressing   Problem: Pain Managment: Goal: General experience of comfort will improve Outcome: Progressing   Problem: Clinical Measurements: Goal: Respiratory complications will improve Outcome: Not Progressing   Problem: Nutrition: Goal: Adequate nutrition will be maintained Outcome: Not Progressing

## 2021-03-28 NOTE — Progress Notes (Signed)
° °   °  [address]       [place] [address]             [phone]    POD # 4 repair aortic dissection   BP 136/76    Pulse 79    Temp 97.9 F (36.6 C) (Oral)    Resp 15    Ht $R'5\' 7"'Cx$  (1.702 m)    Wt 97.5 kg    SpO2 96%    BMI 33.67 kg/m  In NSR 6L HFNC 94%  Still on Nicardipine CBG well controlled  Intake/Output Summary (Last 24 hours) at 03/28/2021 1813 Last data filed at 03/28/2021 1730 Gross per 24 hour  Intake 1938.84 ml  Output 2340 ml  Net -401.16 ml    NO PM labs  Continue current Rx  Dorothy Villa C. Roxan Hockey, MD [facility] [phone]

## 2021-03-28 NOTE — Discharge Instructions (Addendum)
We ask the SNF to please do the following:  1. Please obtain vital signs at least one time daily 2.Please weigh the patient daily. If he or she continues to gain weight or develops lower extremity edema, contact the office at [phone]. 3. Ambulate patient at least three times daily and please use sternal precautions.   Prediabetes Eating Plan Prediabetes is a condition that causes blood sugar (glucose) levels to be higher than normal. This increases the risk for developing type 2 diabetes (type 2 diabetes mellitus). Working with a health care provider or nutrition specialist (dietitian) to make diet and lifestyle changes can help prevent the onset of diabetes. These changes may help you: Control your blood glucose levels. Improve your cholesterol levels. Manage your blood pressure. What are tips for following this plan? Reading food labels Read food labels to check the amount of fat, salt (sodium), and sugar in prepackaged foods. Avoid foods that have: Saturated fats. Trans fats. Added sugars. Avoid foods that have more than 300 milligrams (mg) of sodium per serving. Limit your sodium intake to less than 2,300 mg each day. Shopping Avoid buying pre-made and processed foods. Avoid buying drinks with added sugar. Cooking Cook with olive oil. Do not use butter, lard, or ghee. Bake, broil, grill, steam, or boil foods. Avoid frying. Meal planning Work with your dietitian to create an eating plan that is right for you. This may include tracking how many calories you take in each day. Use a food diary, notebook, or mobile application to track what you eat at each meal. Consider following a Mediterranean diet. This includes: Eating several servings of fresh fruits and vegetables each day. Eating fish at least twice a week. Eating one serving each day of whole grains, beans, nuts, and seeds. Using olive oil instead of other fats. Limiting alcohol. Limiting red meat. Using nonfat or  low-fat dairy products. Consider following a plant-based diet. This includes dietary choices that focus on eating mostly vegetables and fruit, grains, beans, nuts, and seeds. If you have high blood pressure, you may need to limit your sodium intake or follow a diet such as the DASH (Dietary Approaches to Stop Hypertension) eating plan. The DASH diet aims to lower high blood pressure. Lifestyle Set weight loss goals with help from your health care team. It is recommended that most people with prediabetes lose 7% of their body weight. Exercise for at least 30 minutes 5 or more days a week. Attend a support group or seek support from a mental health counselor. Take over-the-counter and prescription medicines only as told by your health care provider. What foods are recommended? Fruits Berries. Bananas. Apples. Oranges. Grapes. Papaya. Mango. Pomegranate. Kiwi. Grapefruit. Cherries. Vegetables Lettuce. Spinach. Peas. Beets. Cauliflower. Cabbage. Broccoli. Carrots. Tomatoes. Squash. Eggplant. Herbs. Peppers. Onions. Cucumbers. Brussels sprouts. Grains Whole grains, such as whole-wheat or whole-grain breads, crackers, cereals, and pasta. Unsweetened oatmeal. Bulgur. Barley. Quinoa. Brown rice. Corn or whole-wheat flour tortillas or taco shells. Meats and other proteins Seafood. Poultry without skin. Lean cuts of pork and beef. Tofu. Eggs. Nuts. Beans. Dairy Low-fat or fat-free dairy products, such as yogurt, cottage cheese, and cheese. Beverages Water. Tea. Coffee. Sugar-free or diet soda. Seltzer water. Low-fat or nonfat milk. Milk alternatives, such as soy or almond milk. Fats and oils Olive oil. Canola oil. Sunflower oil. Grapeseed oil. Avocado. Walnuts. Sweets and desserts Sugar-free or low-fat pudding. Sugar-free or low-fat ice cream and other frozen treats. Seasonings and condiments Herbs. Sodium-free spices. Mustard. Relish. Low-salt,  low-sugar ketchup. Low-salt, low-sugar barbecue sauce.  Low-fat or fat-free mayonnaise. The items listed above may not be a complete list of recommended foods and beverages. Contact a dietitian for more information. What foods are not recommended? Fruits Fruits canned with syrup. Vegetables Canned vegetables. Frozen vegetables with butter or cream sauce. Grains Refined white flour and flour products, such as bread, pasta, snack foods, and cereals. Meats and other proteins Fatty cuts of meat. Poultry with skin. Breaded or fried meat. Processed meats. Dairy Full-fat yogurt, cheese, or milk. Beverages Sweetened drinks, such as iced tea and soda. Fats and oils Butter. Lard. Ghee. Sweets and desserts Baked goods, such as cake, cupcakes, pastries, cookies, and cheesecake. Seasonings and condiments Spice mixes with added salt. Ketchup. Barbecue sauce. Mayonnaise. The items listed above may not be a complete list of foods and beverages that are not recommended. Contact a dietitian for more information. Where to find more information American Diabetes Association: www.diabetes.org Summary You may need to make diet and lifestyle changes to help prevent the onset of diabetes. These changes can help you control blood sugar, improve cholesterol levels, and manage blood pressure. Set weight loss goals with help from your health care team. It is recommended that most people with prediabetes lose 7% of their body weight. Consider following a Mediterranean diet. This includes eating plenty of fresh fruits and vegetables, whole grains, beans, nuts, seeds, fish, and low-fat dairy, and using olive oil instead of other fats. This information is not intended to replace advice given to you by your health care provider. Make sure you discuss any questions you have with your health care provider. Document Revised: 06/23/2019 Document Reviewed: 06/23/2019 Elsevier Patient Education  2022 [facility], [place] [phone]  OPEN  ABDOMINAL SURGERY: POST OP INSTRUCTIONS  Always review your discharge instruction sheet given to you by the facility where your surgery was performed.  IF YOU HAVE DISABILITY OR FAMILY LEAVE FORMS, YOU MUST BRING THEM TO THE OFFICE FOR PROCESSING.  PLEASE DO NOT GIVE THEM TO YOUR DOCTOR.  A prescription for pain medication may be given to you upon discharge.  Take your pain medication as prescribed, if needed.  If narcotic pain medicine is not needed, then you may take acetaminophen (Tylenol) or ibuprofen (Advil) as needed. Take your usually prescribed medications unless otherwise directed. If you need a refill on your pain medication, please contact your pharmacy. They will contact our office to request authorization.  Prescriptions will not be filled after 5pm or on week-ends. You should follow a light diet the first few days after arrival home, such as soup and crackers, pudding, etc.unless your doctor has advised otherwise. A high-fiber, low fat diet can be resumed as tolerated.   Be sure to include lots of fluids daily. Most patients will experience some swelling and bruising on the chest and neck area.  Ice packs will help.  Swelling and bruising can take several days to resolve Most patients will experience some swelling and bruising in the area of the incision. Ice pack will help. Swelling and bruising can take several days to resolve..  It is common to experience some constipation if taking pain medication after surgery.  Increasing fluid intake and taking a stool softener will usually help or prevent this problem from occurring.  A mild laxative (Milk of Magnesia or Miralax) should be taken according to package directions if there are no bowel movements after 48 hours.  You  may have steri-strips (small skin tapes) in place directly over the incision.  These strips should be left on the skin for 7-10 days.  If your surgeon used skin glue on the incision, you may shower in 24 hours.  The glue  will flake off over the next 2-3 weeks.  Any sutures or staples will be removed at the office during your follow-up visit. You may find that a light gauze bandage over your incision may keep your staples from being rubbed or pulled. You may shower and replace the bandage daily. ACTIVITIES:  You may resume regular (light) daily activities beginning the next day--such as daily self-care, walking, climbing stairs--gradually increasing activities as tolerated.  You may have sexual intercourse when it is comfortable.  Refrain from any heavy lifting or straining until approved by your doctor. You may drive when you no longer are taking prescription pain medication, you can comfortably wear a seatbelt, and you can safely maneuver your car and apply brakes Return to Work: ___________________________________ Dennis Bast should see your doctor in the office for a follow-up appointment approximately two weeks after your surgery.  Make sure that you call for this appointment within a day or two after you arrive home to insure a convenient appointment time. OTHER INSTRUCTIONS:  _____________________________________________________________ _____________________________________________________________  WHEN TO CALL YOUR DOCTOR: Fever over 101.0 Inability to urinate Nausea and/or vomiting Extreme swelling or bruising Continued bleeding from incision. Increased pain, redness, or drainage from the incision. Difficulty swallowing or breathing Muscle cramping or spasms. Numbness or tingling in hands or feet or around lips.  The clinic staff is available to answer your questions during regular business hours.  Please dont hesitate to call and ask to speak to one of the nurses if you have concerns.  For further questions, please visit [email or web address]

## 2021-03-29 ENCOUNTER — Inpatient Hospital Stay (HOSPITAL_COMMUNITY): Payer: PRIVATE HEALTH INSURANCE

## 2021-03-29 LAB — CBC
HCT: 29 % — ABNORMAL LOW (ref 36.0–46.0)
Hemoglobin: 9.2 g/dL — ABNORMAL LOW (ref 12.0–15.0)
MCH: 31.8 pg (ref 26.0–34.0)
MCHC: 31.7 g/dL (ref 30.0–36.0)
MCV: 100.3 fL — ABNORMAL HIGH (ref 80.0–100.0)
Platelets: 153 10*3/uL (ref 150–400)
RBC: 2.89 MIL/uL — ABNORMAL LOW (ref 3.87–5.11)
RDW: 19.4 % — ABNORMAL HIGH (ref 11.5–15.5)
WBC: 13.2 10*3/uL — ABNORMAL HIGH (ref 4.0–10.5)
nRBC: 0.3 % — ABNORMAL HIGH (ref 0.0–0.2)

## 2021-03-29 LAB — BASIC METABOLIC PANEL
Anion gap: 8 (ref 5–15)
BUN: 40 mg/dL — ABNORMAL HIGH (ref 6–20)
CO2: 24 mmol/L (ref 22–32)
Calcium: 8.4 mg/dL — ABNORMAL LOW (ref 8.9–10.3)
Chloride: 105 mmol/L (ref 98–111)
Creatinine, Ser: 2.26 mg/dL — ABNORMAL HIGH (ref 0.44–1.00)
GFR, Estimated: 24 mL/min — ABNORMAL LOW (ref >60)
Glucose, Bld: 126 mg/dL — ABNORMAL HIGH (ref 70–99)
Potassium: 3.7 mmol/L (ref 3.5–5.1)
Sodium: 137 mmol/L (ref 135–145)

## 2021-03-29 LAB — GLUCOSE, CAPILLARY
Glucose-Capillary: 105 mg/dL — ABNORMAL HIGH (ref 70–99)
Glucose-Capillary: 107 mg/dL — ABNORMAL HIGH (ref 70–99)
Glucose-Capillary: 111 mg/dL — ABNORMAL HIGH (ref 70–99)
Glucose-Capillary: 119 mg/dL — ABNORMAL HIGH (ref 70–99)
Glucose-Capillary: 133 mg/dL — ABNORMAL HIGH (ref 70–99)
Glucose-Capillary: 134 mg/dL — ABNORMAL HIGH (ref 70–99)

## 2021-03-29 LAB — BRAIN NATRIURETIC PEPTIDE: B Natriuretic Peptide: 954.4 pg/mL — ABNORMAL HIGH (ref 0.0–100.0)

## 2021-03-29 MED ORDER — METOPROLOL TARTRATE 50 MG PO TABS
50.0000 mg | ORAL_TABLET | Freq: Two times a day (BID) | ORAL | Status: DC
  Administered 2021-03-29 – 2021-04-02 (×9): 50 mg via ORAL
  Filled 2021-03-29 (×9): qty 1

## 2021-03-29 MED ORDER — HEPARIN SODIUM (PORCINE) 5000 UNIT/ML IJ SOLN
5000.0000 [IU] | Freq: Three times a day (TID) | INTRAMUSCULAR | Status: DC
  Administered 2021-03-29 – 2021-04-08 (×30): 5000 [IU] via SUBCUTANEOUS
  Filled 2021-03-29 (×29): qty 1

## 2021-03-29 MED ORDER — HYDRALAZINE HCL 50 MG PO TABS
100.0000 mg | ORAL_TABLET | Freq: Three times a day (TID) | ORAL | Status: DC
  Administered 2021-03-29 – 2021-04-02 (×14): 100 mg via ORAL
  Filled 2021-03-29 (×14): qty 2

## 2021-03-29 MED ORDER — GUAIFENESIN ER 600 MG PO TB12
600.0000 mg | ORAL_TABLET | Freq: Two times a day (BID) | ORAL | Status: DC
  Administered 2021-03-29 – 2021-04-02 (×9): 600 mg via ORAL
  Filled 2021-03-29 (×9): qty 1

## 2021-03-29 MED ORDER — LISINOPRIL 5 MG PO TABS
5.0000 mg | ORAL_TABLET | Freq: Every day | ORAL | Status: DC
Start: 2021-03-29 — End: 2021-04-01
  Administered 2021-03-29 – 2021-03-31 (×3): 5 mg via ORAL
  Filled 2021-03-29 (×3): qty 1

## 2021-03-29 NOTE — Progress Notes (Addendum)
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 She has sternal,incisional pain this am and cough.  Objective: Vital signs in last 24 hours: Temp:  [97.9 F (36.6 C)-98.3 F (36.8 C)] 98.3 F (36.8 C) (12/23 0445) Pulse Rate:  [59-89] 73 (12/23 0700) Cardiac Rhythm: Normal sinus rhythm (12/23 0400) Resp:  [9-25] 11 (12/23 0700) BP: (97-150)/(47-95) 114/63 (12/23 0700) SpO2:  [90 %-97 %] 96 % (12/23 0700) Weight:  [98.2 kg] 98.2 kg (12/23 0500)  Filed Weights   03/27/21 0500 03/28/21 0500 03/29/21 0500  Weight: 99.7 kg 97.5 kg 98.2 kg    Weight change: 0.7 kg      Intake/Output from previous day: 12/22 0701 - 12/23 0700 In: 1521.2 [P.O.:290; I.V.:1231.2] Out: 3090 [Urine:2780; Chest Tube:310]  Intake/Output this shift: No intake/output data recorded.  Current Meds: Scheduled Meds:  acetaminophen  1,000 mg Oral Q6H   amLODipine  10 mg Oral Daily   aspirin EC  325 mg Oral Daily   bisacodyl  10 mg Oral Daily   Or   bisacodyl  10 mg Rectal Daily   Chlorhexidine Gluconate Cloth  6 each Topical Daily   docusate sodium  200 mg Oral Daily   feeding supplement  237 mL Oral BID BM   furosemide  80 mg Intravenous BID   heparin  5,000 Units Subcutaneous Q8H   insulin aspart  0-24 Units Subcutaneous Q4H   lidocaine  1 patch Transdermal Q24H   mouth rinse  15 mL Mouth Rinse BID   metoprolol tartrate  25 mg Oral BID   pantoprazole  40 mg Oral Daily   sodium chloride flush  10-40 mL Intracatheter Q12H   sodium chloride flush  3 mL Intravenous Q12H   Continuous Infusions:  sodium chloride Stopped  (03/26/21 1028)   sodium chloride     sodium chloride 10 mL/hr at 03/25/21 0545   lactated ringers     lactated ringers     lactated ringers 10 mL/hr at 03/29/21 0700   niCARDipine 10 mg/hr (03/29/21 0700)   PRN Meds:.sodium chloride, fentaNYL (SUBLIMAZE) injection, hydrALAZINE, lactated ringers, metoprolol tartrate, midazolam, ondansetron (ZOFRAN) IV, oxyCODONE, sodium chloride flush, sodium chloride flush, traMADol  General appearance: alert, cooperative, and no distress Neurologic: intact Heart: RRR, no murmur Lungs: Diminished bibasilar breath sounds  Abdomen: Soft, obese, non tender, bowel sounds present Extremities: Mild LE edema, palp DP bilaterally. ++ swelling of hands Wound: Sternal wound has minor bloody ooze  proximally No sign of infection. Right axillary wound is clean and dry.  Lab Results: CBC: Recent Labs    03/28/21 0358 03/29/21 0214  WBC 13.6* 13.2*  HGB 9.1* 9.2*  HCT 29.2* 29.0*  PLT 122* 153    BMET:  Recent Labs    03/28/21 0358 03/29/21 0214  NA 135 137  K 4.2 3.7  CL 104 105  CO2 23 24  GLUCOSE 130* 126*  BUN 36* 40*  CREATININE 2.48* 2.26*  CALCIUM 8.2* 8.4*     CMET:  Lab Results  Component Value Date   WBC 13.2 (H) 03/29/2021   HGB 9.2 (L) 03/29/2021   HCT 29.0 (L) 03/29/2021   PLT 153 03/29/2021   GLUCOSE 126 (H) 03/29/2021   CHOL 220 (H) 08/21/2017   TRIG 227 (H) 08/21/2017   HDL 50 (L) 08/21/2017   LDLCALC 133 (H) 08/21/2017   ALT 10 08/21/2017   AST 15 08/21/2017   NA 137 03/29/2021   K 3.7 03/29/2021   CL 105 03/29/2021   CREATININE 2.26 (H) 03/29/2021   BUN 40 (H) 03/29/2021   CO2 24 03/29/2021   TSH 0.949 12/23/2015   INR 1.4 (H) 03/25/2021   HGBA1C 5.9 (H) 03/28/2021      PT/INR:  No results for input(s): LABPROT, INR in the last 72 hours.  Radiology: No results found.   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 On Cardene drip at 10 mg/hr and Amlodipine 10 mg daily and Lopressor 25 mg bid. Pulmonary-on 6 liters of oxygen via HFNC. Chest tubes with 310 cc last 24 hours. CXR this am appears to show mediastinal widening,cardiomegaly, atelectasis at left base. Hope to remove chest tubes soon. Mucinex bid cough. Encourage incentive spirometer and flutter valve Volume overload-BNP 954.4 this am. On Lasix 80 mg IV bid CBGs 156/137/134. HGA1C 5.9. Last HGA1C 2019 was 6.4. No documented history of diabetes. Will provide nutrition information with discharge paperwork. She will need further surveillance after discharge with medical doctor.  5. Expected post op blood loss anemia-H and H this am stable at 9.2 and 29 6. Mild thrombocytopenia resolved-platelets this am increased to 153,000. 7. CKD (stage IV)-creatinine this am decreased to 2.26. Creatinine upon admission 1.99. Chronic Kidney Disease   Stage I     GFR >90  Stage II    GFR 60-89  Stage IIIA GFR 45-59  Stage IIIB GFR 30-44  Stage IV   GFR 15-29  Stage V    GFR  <15  Lab Results  Component Value Date   CREATININE 2.26 (H) 03/29/2021   Estimated Creatinine Clearance: 31.8 mL/min (A) (by C-G formula based on SCr of 2.26 mg/dL (H)).  8. Deconditioned-PT/OT.Patient will need CIR/SNF at discharge 9. Remove EPW, foley  Donielle Liston Alba PA-C 03/29/2021 7:06 AM   Patient seen and examined, agree with assessment and plan as outlined above Dc chest tubes Has large ventral hernia with abdominal distention but no pain or tenderness  Remo Lipps C. Roxan Hockey, MD [facility] [phone]

## 2021-03-29 NOTE — Progress Notes (Signed)
Pulled pacing wires and chest tubes per MD order today at 3:45pm. Wires intact upon removal. Patient tolerated procedure well and remained on bedrest for 1 hour. Vital signs cycled q 15 min for 1 hour. Pt had no complaints.

## 2021-03-29 NOTE — Progress Notes (Signed)
Physical Therapy Treatment Patient Details Name: Dorothy Harris MRN: [id number] DOB: 10/02/1960 Today's Date: 03/29/2021   History of Present Illness The pt is a 60 yo female presenting 12/18 with c/o chest pain while cleaning house, given 2 NG on arrival with improvement in sx. 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 Extubated 12/20. PMH includes: CAD, NSTEMI, descending thoracic dissection for which pt has not followed up with vascular surgery, HTN, and current tobacco use.    PT Comments    The pt continues to make slow but steady progress with mobility this morning. She was able to complete 2x bouts of ambulation in the room (8 ft and 10 ft) with chair follow while on 6L O2. The pt fatigues quickly and requires boost to 8L after mobility to fully recover during seated rest, and continues to require minA of 2 to power up to standing due to poor functional strength and power. Despite progress, continue to recommend SNF due to pt lack of social support after d/c.     Recommendations for follow up therapy are one component of a multi-disciplinary discharge planning process, led by the attending physician.  Recommendations may be updated based on patient status, additional functional criteria and insurance authorization.  Follow Up Recommendations  Skilled nursing-short term rehab (<3 hours/day)     Assistance Recommended at Discharge Frequent or constant Supervision/Assistance  Equipment Recommendations  Other (comment) (defer to post acute)    Recommendations for Other Services       Precautions / Restrictions Precautions Precautions: Sternal;ICD/Pacemaker;Fall Precaution Comments: chest tube, external pacer, watch O2 (on 6-8L during session) Restrictions Weight Bearing Restrictions: Yes Other Position/Activity Restrictions: sternal precautions      Mobility  Bed Mobility               General bed mobility comments: OOB in recliner upon arrival    Transfers Overall transfer level: Needs assistance Equipment used: Rolling walker (2 wheels) Transfers: Sit to/from Stand Sit to Stand: Min assist;+2 physical assistance           General transfer comment: use of momentum and pt holding pillow for sternal precautions. min A + 2 to power up to standing    Ambulation/Gait Ambulation/Gait assistance: Min assist;+2 safety/equipment Gait Distance (Feet): 8 Feet (+ 10 ft) Assistive device: Rolling walker (2 wheels) Gait Pattern/deviations: Step-to pattern;Decreased step length - left;Shuffle Gait velocity: decreased Gait velocity interpretation: <1.31 ft/sec, indicative of household ambulator   General Gait Details: pt with small shuffling steps with minimal clearance or step length. chair follow for safety, able to maintain 5 ft on 6L, needs 8L as she fatigues      Balance Overall balance assessment: Needs assistance Sitting-balance support: No upper extremity supported;Feet supported Sitting balance-Leahy Scale: Fair     Standing balance support: Bilateral upper extremity supported;During functional activity;Reliant on assistive device for balance Standing balance-Leahy Scale: Poor Standing balance comment: dependent on BUE support                            Cognition Arousal/Alertness: Awake/alert Behavior During Therapy: Flat affect Overall Cognitive Status: Within Functional Limits for tasks assessed  General Comments: pt with delayed response verbally and following instructions. very flat affect with minimal verbal contributions unless directly asked questions.        Exercises      General Comments General comments (skin integrity, edema, etc.): HR as high as 128 with activity, low 100's at rest. 6 liters at rest (dropped to 88% with activity) so  turned up to 8 with activity      Pertinent Vitals/Pain Pain Assessment: Faces Faces Pain Scale: Hurts whole lot Pain Location: incision Pain Descriptors / Indicators: Discomfort;Grimacing Pain Intervention(s): Limited activity within patient's tolerance;Monitored during session;Repositioned           PT Goals (current goals can now be found in the care plan section) Acute Rehab PT Goals Patient Stated Goal: to reduce pain and eventually be able to return home PT Goal Formulation: With patient Time For Goal Achievement: 04/11/21 Potential to Achieve Goals: Good Progress towards PT goals: Progressing toward goals    Frequency    Min 3X/week      PT Plan Current plan remains appropriate    Co-evaluation PT/OT/SLP Co-Evaluation/Treatment: Yes Reason for Co-Treatment: For patient/therapist safety;To address functional/ADL transfers PT goals addressed during session: Mobility/safety with mobility;Balance;Proper use of DME OT goals addressed during session: ADL's and self-care;Proper use of Adaptive equipment and DME;Strengthening/ROM      AM-PAC PT "6 Clicks" Mobility   Outcome Measure  Help needed turning from your back to your side while in a flat bed without using bedrails?: A Lot Help needed moving from lying on your back to sitting on the side of a flat bed without using bedrails?: A Lot Help needed moving to and from a bed to a chair (including a wheelchair)?: Total Help needed standing up from a chair using your arms (e.g., wheelchair or bedside chair)?: Total Help needed to walk in hospital room?: Total Help needed climbing 3-5 steps with a railing? : Total 6 Click Score: 8    End of Session Equipment Utilized During Treatment: Oxygen Activity Tolerance: Patient limited by fatigue;Patient tolerated treatment well Patient left: in chair;with call bell/phone within reach Nurse Communication: Mobility status PT Visit Diagnosis: Other abnormalities of gait and  mobility (R26.89);Muscle weakness (generalized) (M62.81)     Time: 5520-8022 PT Time Calculation (min) (ACUTE ONLY): 25 min  Charges:  $Therapeutic Exercise: 8-22 mins                     West Carbo, PT, DPT   Acute Rehabilitation Department Pager #: [phone]   Sandra Cockayne 03/29/2021, 12:15 PM

## 2021-03-29 NOTE — TOC Progression Note (Signed)
Transition of Care [facility]) - Progression Note    Patient Details  Name: Dorothy Harris MRN: [id number] Date of Birth: 09-Jun-1960  Transition of Care [facility]) CM/SW [place], [place] Phone Number: 03/29/2021, 2:30 PM  Clinical Narrative:     CSW met with pt and pt sister bedside to discuss insurance. Yesterday pt's insurance was listed as [employer] though this insurance now shows to be inactive. CSW explained to pt and sister. Pt states that this insurance is supposed to begin in 2023. She is unsure if insurance is through her work and states that she doesn't have another insurance that is current. CSW explained that insurance can be a barrier to SNF placement as insurance coverage may not be active until January 2023. CSW provided pt's sister with insurance name and member ID for her records as she is assisting pt with SNF decision. TOC will continue to follow for SNF placement.   Expected Discharge Plan: [place] Barriers to Discharge: Continued Medical Work up  Expected Discharge Plan and Services Expected Discharge Plan: [place] arrangements for the past 2 months: Single Family Home                                       Social Determinants of Health (SDOH) Interventions    Readmission Risk Interventions No flowsheet data found.

## 2021-03-29 NOTE — Evaluation (Signed)
Occupational Therapy Evaluation Patient Details Name: Dorothy Harris MRN: [id number] DOB: 05-29-1960 Today's Date: 03/29/2021   History of Present Illness The pt is a 60 yo female presenting 12/18 with c/o chest pain while cleaning house, given 2 NG on arrival with improvement in sx. 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 Extubated 12/20. PMH includes: CAD, NSTEMI, descending thoracic dissection for which pt has not followed up with vascular surgery, HTN, and current tobacco use.   Clinical Impression   This 60 yo female admitted with above presents to acute OT with PLOF of being totally independent with basic and IADLs but was rather sedentary per her report. Currently she is Independent to Total A for basic ADLs with Min A +2 sit<>stand. She will continue to benefit from acute OT with follow up at SNF.      Recommendations for follow up therapy are one component of a multi-disciplinary discharge planning process, led by the attending physician.  Recommendations may be updated based on patient status, additional functional criteria and insurance authorization.   Follow Up Recommendations  Skilled nursing-short term rehab (<3 hours/day)    Assistance Recommended at Discharge Frequent or constant Supervision/Assistance  Functional Status Assessment  Patient has had a recent decline in their functional status and demonstrates the ability to make significant improvements in function in a reasonable and predictable amount of time.  Equipment Recommendations  Other (comment) (TBD next venue)       Precautions / Restrictions Precautions Precautions: Sternal;ICD/Pacemaker;Fall Precaution Comments: chest tube, external pacer, watch O2 (on 6-10L during session) Restrictions Weight Bearing Restrictions: Yes Other Position/Activity Restrictions: sternal precautions       Mobility Bed Mobility               General bed mobility comments: OOB in recliner upon arrival    Transfers Overall transfer level: Needs assistance Equipment used: Rolling walker (2 wheels) Transfers: Sit to/from Stand Sit to Stand: Min assist;+2 physical assistance           General transfer comment: use of momentum and pt holding pillow for sternal precautions. min A + 2 to power up to standing      Balance Overall balance assessment: Needs assistance Sitting-balance support: No upper extremity supported;Feet supported Sitting balance-Leahy Scale: Fair     Standing balance support: Bilateral upper extremity supported;During functional activity;Reliant on assistive device for balance Standing balance-Leahy Scale: Poor Standing balance comment: dependent on BUE support                           ADL either performed or assessed with clinical judgement   ADL Overall ADL's : Needs assistance/impaired Eating/Feeding: Independent;Sitting Eating/Feeding Details (indicate cue type and reason): in recliner Grooming: Minimal assistance;Sitting Grooming Details (indicate cue type and reason): in recliner Upper Body Bathing: Moderate assistance;Sitting Upper Body Bathing Details (indicate cue type and reason): in recliner Lower Body Bathing: Total assistance Lower Body Bathing Details (indicate cue type and reason): Min A +2 sit<>stand Upper Body Dressing : Maximal assistance;Sitting Upper Body Dressing Details (indicate cue type and reason): in recliner Lower Body Dressing: Total assistance Lower Body Dressing Details (indicate cue type and reason): Min A +2 sit<>stand Toilet Transfer: Minimal assistance;Ambulation;Rolling walker (2 wheels) Toilet Transfer Details (indicate cue type and reason): recliner>ambulated 8 feet>sit in recliner>ambulated 12 feet>sit in recliner  Toileting- Clothing Manipulation and Hygiene: Total assistance Toileting - Clothing  Manipulation Details (indicate cue type and reason): min A +2 sit<>stand             Vision Patient Visual Report: No change from baseline              Pertinent Vitals/Pain Pain Assessment: 0-10 Faces Pain Scale: Hurts whole lot Pain Location: incision Pain Descriptors / Indicators: Discomfort;Grimacing Pain Intervention(s): Limited activity within patient's tolerance;Monitored during session;Repositioned;Patient requesting pain meds-RN notified;RN gave pain meds during session     Hand Dominance Right   Extremity/Trunk Assessment Upper Extremity Assessment Upper Extremity Assessment: Generalized weakness;LUE deficits/detail LUE Deficits / Details: edematous in hand--pt reports it is not bothering her or restricting her movement           Communication Communication Communication: No difficulties   Cognition Arousal/Alertness: Awake/alert Behavior During Therapy: Flat affect Overall Cognitive Status: Within Functional Limits for tasks assessed                                       General Comments  HR as high as 128 with activity, low 100's at rest. 6 liters at rest (dropped to 88% with activity) so turned up to 8 with activity        Home Living Family/patient expects to be discharged to:: Skilled nursing facility Living Arrangements: Alone Available Help at Discharge: Friend(s);Available PRN/intermittently;Other (Comment) Type of Home: House Home Access: Stairs to enter [employer] of Steps: 2   Home Layout: One level     Bathroom Shower/Tub: Teacher, early years/pre: Standard     Home Equipment: Building services engineer Comments: pt states unsure of rail at home or assist available, otherwise not using any DME prior to arrival      Prior Functioning/Environment Prior Level of Function : Independent/Modified Independent             Mobility Comments: pt reports independence without use of DME, limited by  fatigue and spends most of her days laying in bed watching TV. states she can mobilize when needed to complete ADLs ADLs Comments: pt reports independence with ADLs and IADLs        OT Problem List: Decreased strength;Decreased range of motion;Impaired balance (sitting and/or standing);Pain;Decreased knowledge of use of DME or AE;Decreased safety awareness;Decreased knowledge of precautions      OT Treatment/Interventions: Self-care/ADL training;DME and/or AE instruction;Patient/family education;Balance training;Energy conservation    OT Goals(Current goals can be found in the care plan section) Acute Rehab OT Goals Patient Stated Goal: to feel better OT Goal Formulation: With patient Time For Goal Achievement: 04/12/21 Potential to Achieve Goals: Good  OT Frequency: Min 2X/week   Barriers to D/C: Decreased caregiver support          Co-evaluation PT/OT/SLP Co-Evaluation/Treatment: Yes Reason for Co-Treatment: For patient/therapist safety PT goals addressed during session: Mobility/safety with mobility;Balance;Strengthening/ROM;Proper use of DME OT goals addressed during session: ADL's and self-care;Proper use of Adaptive equipment and DME;Strengthening/ROM      AM-PAC OT "6 Clicks" Daily Activity     Outcome Measure Help from another person eating meals?: A Little (setup) Help from another person taking care of personal grooming?: A Little Help from another person toileting, which includes using toliet, bedpan, or urinal?: A Lot Help from another person bathing (including washing, rinsing, drying)?: A Lot Help from another person to put  on and taking off regular upper body clothing?: A Lot Help from another person to put on and taking off regular lower body clothing?: Total 6 Click Score: 13   End of Session Equipment Utilized During Treatment: Gait belt;Rolling walker (2 wheels) Nurse Communication: Mobility status  Activity Tolerance: Patient tolerated treatment  well Patient left: in chair;with call bell/phone within reach  OT Visit Diagnosis: Unsteadiness on feet (R26.81);Other abnormalities of gait and mobility (R26.89);Muscle weakness (generalized) (M62.81);Pain Pain - Right/Left:  (incisional)                Time: 3267-1245 OT Time Calculation (min): 24 min Charges:  OT General Charges $OT Visit: 1 Visit OT Evaluation $OT Eval Moderate Complexity: 1 Mod  [place] [address], OTR/L Acute [employer] Pager [phone] Office [phone]    Almon Register 03/29/2021, 11:05 AM

## 2021-03-29 NOTE — Progress Notes (Signed)
NAME:  Dorothy Harris, MRN:  [id number], DOB:  1960-12-09, LOS: 5 ADMISSION DATE:  03/24/2021, CONSULTATION DATE:  12/19 REFERRING MD:  Cyndia Bent, CHIEF COMPLAINT:  critical care medicine BP control and complex medical management    History of Present Illness:  This is a 60 year old female w/ hx as per below.. Presented to [facility] 12/18 w/ cc: chest pain and SOB. 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 Started on esmolol, seen by CVTS; went to OR where she underwent repair and replacement of ascending aorta and replacement of aortic arch w/ Hemashield graft. Returned to surgical ICU post op on 12/19. 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  Epigasatric hernia  Significant Hospital Events: Including procedures, antibiotic start and stop dates in addition to other pertinent events   12/18 admitted w/ type A dissection. Underwent: Median Sternotomy Anastomosis of 8 mm Hemashield graft to the right axillary artery. Extracorporeal circulation via the right axillary artery and right atrial appendage Replacement of aortic arch under deep hypothermic circulatory arrest with bypass of the innominate, left common carotid and left subclavian arteries using a 30 x 10 x 8 x 8 x 10 mm Hemashield Platinum graft.  Supra-coronary replacement of the ascending aorta. Cross clamp time of 169 minutes. Pt was noted to be coagulopathic intra-op  12/22 remains on cardene for hypertension.   Interim History / Subjective:   Continues to be hypertensive requiring nicardipine infusion.  Ambulating with assistance.  Largely sedentary at home.  Chest pain has improved.  Objective   Blood pressure 135/80, pulse 87, temperature 98.5 F (36.9 C), temperature source  Oral, resp. rate (!) 22, height $RemoveBe'5\' 7"'UTMwbnYsZ$  (1.702 m), weight 98.2 kg, SpO2 95 %.        Intake/Output Summary (Last 24 hours) at 03/29/2021 0921 Last data filed at 03/29/2021 0900 Gross per 24 hour  Intake 1461.53 ml  Output 2970 ml  Net -1508.47 ml    Filed Weights   03/27/21 0500 03/28/21 0500 03/29/21 0500  Weight: 99.7 kg 97.5 kg 98.2 kg    Examination: General: Up in chair, lethargic, on Cardene at 10 creatinine 2.26 HENT:  no skin break down,  Lungs: bronchial breath sounds at both bases.  Cardiovascular: . Has anterior mediastinal tubes w/ min bloody output. Backup rate of 50 bpm, has bilateral radial alines that correlate. Sternal dressing intact. HS distant, no  rub.   CVP 15 Abdomen: soft  Extremities: warm strong pulses, minimal edema.  Neuro: awake and oriented but poor speech and some gaps in insight.   GU: clear yellow   Ancillary tests personally reviewed:  Creatinine 2.26. BNP remains elevated at 954  Assessment & Plan:  S/p AAA repair w/ replacement of ascending aorta and replacement of aortic arch w/ Hemashield graft.  Critically ill due to expected post operative respiratory failure requiring mechanical ventilation.  CKD stage III, at risk for AKI  Post-op anemia H/o poorly controlled HTN with LVH Mild hypoactive delirium   Plan:  - Aggressive chest physiotherapy - Multimodal pain control - added lidocaine patches - Keep SBP< 130, HR <65 -on home amlodipine.  Increased beta-blocker.  Started home hydralazine.  Will start lisinopril at low-dose.  Needs it for nephro protection. -  Pacemaker wires and Foley catheter to come out. - Monitor for worsening AKI - avoid NSAIDS  for pain -  Continue to follow CVP as indicator of volume status.  - Will diurese to CVP <12.  Diurese again today  Best Practice (right click and "Reselect all SmartList Selections" daily)   Diet/type: Regular DVT prophylaxis: Subcutaneous heparin GI prophylaxis: Not indicated Lines:  Continue central line to monitor CVP Foley: Will remove Code Status:  full code Last date of multidisciplinary goals of care discussion: sister and husband updated at the bedside.   CRITICAL CARE Performed by: Kipp Brood   Total critical care time: 35 minutes  Critical care time was exclusive of separately billable procedures and treating other patients.  Critical care was necessary to treat or prevent imminent or life-threatening deterioration.  Critical care was time spent personally by me on the following activities: development of treatment plan with patient and/or surrogate as well as nursing, discussions with consultants, evaluation of patient's response to treatment, examination of patient, obtaining history from patient or surrogate, ordering and performing treatments and interventions, ordering and review of laboratory studies, ordering and review of radiographic studies, pulse oximetry, re-evaluation of patient's condition and participation in multidisciplinary rounds.  Kipp Brood, MD [facility] ICU Physician [place]  Pager: [phone] Mobile: [phone] After hours: [phone].

## 2021-03-30 ENCOUNTER — Inpatient Hospital Stay (HOSPITAL_COMMUNITY): Payer: PRIVATE HEALTH INSURANCE

## 2021-03-30 LAB — BASIC METABOLIC PANEL
Anion gap: 10 (ref 5–15)
BUN: 45 mg/dL — ABNORMAL HIGH (ref 6–20)
CO2: 24 mmol/L (ref 22–32)
Calcium: 8.4 mg/dL — ABNORMAL LOW (ref 8.9–10.3)
Chloride: 102 mmol/L (ref 98–111)
Creatinine, Ser: 2.4 mg/dL — ABNORMAL HIGH (ref 0.44–1.00)
GFR, Estimated: 23 mL/min — ABNORMAL LOW (ref >60)
Glucose, Bld: 149 mg/dL — ABNORMAL HIGH (ref 70–99)
Potassium: 3.3 mmol/L — ABNORMAL LOW (ref 3.5–5.1)
Sodium: 136 mmol/L (ref 135–145)

## 2021-03-30 LAB — CBC
HCT: 27.8 % — ABNORMAL LOW (ref 36.0–46.0)
Hemoglobin: 8.9 g/dL — ABNORMAL LOW (ref 12.0–15.0)
MCH: 32.6 pg (ref 26.0–34.0)
MCHC: 32 g/dL (ref 30.0–36.0)
MCV: 101.8 fL — ABNORMAL HIGH (ref 80.0–100.0)
Platelets: 180 10*3/uL (ref 150–400)
RBC: 2.73 MIL/uL — ABNORMAL LOW (ref 3.87–5.11)
RDW: 18.6 % — ABNORMAL HIGH (ref 11.5–15.5)
WBC: 19 10*3/uL — ABNORMAL HIGH (ref 4.0–10.5)
nRBC: 0.3 % — ABNORMAL HIGH (ref 0.0–0.2)

## 2021-03-30 LAB — GLUCOSE, CAPILLARY
Glucose-Capillary: 111 mg/dL — ABNORMAL HIGH (ref 70–99)
Glucose-Capillary: 126 mg/dL — ABNORMAL HIGH (ref 70–99)
Glucose-Capillary: 129 mg/dL — ABNORMAL HIGH (ref 70–99)
Glucose-Capillary: 142 mg/dL — ABNORMAL HIGH (ref 70–99)
Glucose-Capillary: 165 mg/dL — ABNORMAL HIGH (ref 70–99)
Glucose-Capillary: 173 mg/dL — ABNORMAL HIGH (ref 70–99)

## 2021-03-30 MED ORDER — HYDRALAZINE HCL 20 MG/ML IJ SOLN
20.0000 mg | INTRAMUSCULAR | Status: DC | PRN
  Administered 2021-03-31 – 2021-04-08 (×6): 20 mg via INTRAVENOUS
  Filled 2021-03-30 (×7): qty 1

## 2021-03-30 MED ORDER — POTASSIUM CHLORIDE 20 MEQ PO PACK: 40.0000 meq | PACK | Freq: Once | ORAL | Status: DC

## 2021-03-30 MED ORDER — POTASSIUM CHLORIDE CRYS ER 20 MEQ PO TBCR
40.0000 meq | EXTENDED_RELEASE_TABLET | Freq: Once | ORAL | Status: AC
  Administered 2021-03-30: 06:00:00 40 meq via ORAL
  Filled 2021-03-30: qty 2

## 2021-03-30 NOTE — Progress Notes (Signed)
[facility] Progress Note Patient Name: Dorothy Harris DOB: 22-Dec-1960 MRN: [id number]   Date of Service  03/30/2021  HPI/Events of Note  K 3.3, Cr 2.4.  Has PIV only Receiving furosemide  eICU Interventions  Ordered potasium 40 meqs PO x 1     Intervention Category Intermediate Interventions: Electrolyte abnormality - evaluation and management  Judd Lien 03/30/2021, 5:38 AM
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
## 2021-03-30 NOTE — Progress Notes (Signed)
° °   °  [address]       [place] [address]             [phone]                 6 Days Post-Op Procedure(s) (LRB): 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 USING HEMASHIELD PLATINUM  [id number] mm (N/A) INTRAOPERATIVE TRANSESOPHAGEAL ECHOCARDIOGRAM APPLICATION OF CELL SAVER   Events: No events _______________________________________________________________ Vitals: BP 127/79    Pulse 70    Temp 97.8 F (36.6 C) (Oral)    Resp 12    Ht $R'5\' 7"'vo$  (1.702 m)    Wt 97 kg    SpO2 96%    BMI 33.49 kg/m  Filed Weights   03/28/21 0500 03/29/21 0500 03/30/21 0600  Weight: 97.5 kg 98.2 kg 97 kg     - Neuro: alert NAD  - Cardiovascular: sinus  Drips: cardene 2.5.   CVP:  [12 mmHg-39 mmHg] 12 mmHg  - Pulm: EWOB    ABG    Component Value Date/Time   PHART 7.370 03/26/2021 1038   PCO2ART 41.8 03/26/2021 1038   PO2ART 76 (L) 03/26/2021 1038   HCO3 24.1 03/26/2021 1038   TCO2 25 03/26/2021 1038   ACIDBASEDEF 1.0 03/26/2021 1038   O2SAT 94.0 03/26/2021 1038    - Abd: ND - Extremity: warm  .Intake/Output      12/23 0701 12/24 0700 12/24 0701 12/25 0700   P.O. 480    I.V. (mL/kg) 481.2 (5)    Total Intake(mL/kg) 961.2 (9.9)    Urine (mL/kg/hr) 2190 (0.9)    Chest Tube 30    Total Output 2220    Net -1258.8            _______________________________________________________________ Labs: CBC Latest Ref Rng & Units 03/30/2021 03/29/2021 03/28/2021  WBC 4.0 - 10.5 K/uL 19.0(H) 13.2(H) 13.6(H)  Hemoglobin 12.0 - 15.0 g/dL 8.9(L) 9.2(L) 9.1(L)  Hematocrit 36.0 - 46.0 % 27.8(L) 29.0(L) 29.2(L)  Platelets 150 - 400 K/uL 180 153 122(L)   CMP Latest Ref Rng & Units 03/30/2021 03/29/2021 03/28/2021  Glucose 70 - 99 mg/dL 149(H) 126(H) 130(H)  BUN 6 - 20 mg/dL 45(H) 40(H) 36(H)  Creatinine 0.44 - 1.00 mg/dL 2.40(H) 2.26(H) 2.48(H)  Sodium 135 - 145 mmol/L 136 137 135  Potassium 3.5 - 5.1 mmol/L 3.3(L) 3.7 4.2  Chloride 98 - 111 mmol/L 102  105 104  CO2 22 - 32 mmol/L $RemoveB'24 24 23  'eMKZrAia$ Calcium 8.9 - 10.3 mg/dL 8.4(L) 8.4(L) 8.2(L)  Total Protein 6.1 - 8.1 g/dL - - -  Total Bilirubin 0.2 - 1.2 mg/dL - - -  Alkaline Phos 38 - 126 U/L - - -  AST 10 - 35 U/L - - -  ALT 6 - 29 U/L - - -    CXR: PVCongestion  _______________________________________________________________  Assessment and Plan: POD 6 s/p Type A/Arch repair  Neuro: pain controlled CV: d/c cardene today.  On amlodipine, hydralazine, lisinopriol, and metoprolol.   Pulm: wean O2.   Renal: creat slightly up.  On lisinopriol  holding lasix.  Will watch potassium GI: on diet, not eating much Heme: stable ID: afebrile.  WBC up today.  Will follow Endo: SSI Dispo: continue ICU care   Astha Probasco O Kaarin Pardy 03/30/2021 9:00 AM

## 2021-03-30 NOTE — Progress Notes (Addendum)
CARDIAC REHAB PHASE I   PRE:  Rate/Rhythm: Sinus Rhythm 61  1300-1330 Encouraged use of incentive spirometer. Dorothy Harris was given handouts on heart healthy diet, smoking cessation and phase 2 cardiac rehab at [facility]. Dorothy Cullen says she is interested after discharge. Will place referral and follow up with the patient next week.     Harrell Gave RN

## 2021-03-30 NOTE — Plan of Care (Signed)
°  Problem: Respiratory: Goal: Respiratory status will improve Outcome: Progressing   Problem: Cardiac: Goal: Will achieve and/or maintain hemodynamic stability Outcome: Progressing   Problem: Urinary Elimination: Goal: Ability to achieve and maintain adequate renal perfusion and functioning will improve Outcome: Progressing   Problem: Pain Managment: Goal: General experience of comfort will improve Outcome: Progressing

## 2021-03-30 NOTE — Progress Notes (Signed)
NAME:  Dorothy Harris, MRN:  [id number], DOB:  1961-01-28, LOS: 6 ADMISSION DATE:  03/24/2021, CONSULTATION DATE:  12/19 REFERRING MD:  Laneta Simmers, CHIEF COMPLAINT:  critical care medicine BP control and complex medical management    History of Present Illness:  This is a 60 year old female w/ hx as per below.. Presented to [facility] 12/18 w/ cc: chest pain and SOB. 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 Started on esmolol, seen by CVTS; went to OR where she underwent repair and replacement of ascending aorta and replacement of aortic arch w/ Hemashield graft. Returned to surgical ICU post op on 12/19. 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  Epigasatric hernia  Significant Hospital Events: Including procedures, antibiotic start and stop dates in addition to other pertinent events   12/18 admitted w/ type A dissection. Underwent: Median Sternotomy Anastomosis of 8 mm Hemashield graft to the right axillary artery. Extracorporeal circulation via the right axillary artery and right atrial appendage Replacement of aortic arch under deep hypothermic circulatory arrest with bypass of the innominate, left common carotid and left subclavian arteries using a 30 x 10 x 8 x 8 x 10 mm Hemashield Platinum graft.  Supra-coronary replacement of the ascending aorta. Cross clamp time of 169 minutes. Pt was noted to be coagulopathic intra-op  12/22 remains on cardene for hypertension.   Interim History / Subjective:  Finally off nicardipine  Ambulating with assistance.  Largely sedentary at home.  Chest pain has improved. Today complains of abdominal pain. Cannot tell me if she is passing gas.  Does IS only when nurse prompts her.  Objective   Blood  pressure 127/79, pulse 70, temperature 97.8 F (36.6 C), temperature source Oral, resp. rate 12, height 5\' 7"  (1.702 m), weight 97 kg, SpO2 96 %. CVP:  [12 mmHg-39 mmHg] 15 mmHg      Intake/Output Summary (Last 24 hours) at 03/30/2021 1106 Last data filed at 03/30/2021 1000 Gross per 24 hour  Intake 1280.85 ml  Output 2090 ml  Net -809.15 ml    Filed Weights   03/28/21 0500 03/29/21 0500 03/30/21 0600  Weight: 97.5 kg 98.2 kg 97 kg    Examination: General: Up in chair, appears uncomfortable. Off Cardene. HENT:  no skin break down,  Lungs: bronchial breath sounds at both bases persists.  Cardiovascular: Sternal dressing intact. HS distant, no  rub.   CVP 15 Abdomen: soft  Extremities: warm strong pulses, minimal edema.  Neuro: awake and oriented but poor speech and some gaps in insight.   GU: clear yellow   Ancillary tests personally reviewed:  Creatinine 2..40 BNP remains elevated at 954 WBC 19  Assessment & Plan:  S/p AAA repair w/ replacement of ascending aorta and replacement of aortic arch w/ Hemashield graft.  Critically ill due to expected post operative respiratory failure requiring mechanical ventilation.  CKD stage III, at risk for AKI  Post-op anemia H/o poorly controlled HTN with LVH Mild hypoactive delirium   Plan:  - Aggressive chest physiotherapy - Multimodal pain control - added lidocaine patches - BP better controlled now.  Off Cardene . KEEP SBP < 130.  -Pacemaker wires and Foley catheter to come out. - Monitor for worsening AKI -  avoid NSAIDS  for pain -  Continue to follow CVP as indicator of volume status.  - Will diurese to CVP <12.  Diurese again today. Follow creatinine. May need hie   Best Practice (right click and "Reselect all SmartList Selections" daily)   Diet/type: Regular DVT prophylaxis: Subcutaneous heparin GI prophylaxis: Not indicated Lines: Continue central line to monitor CVP Foley: Will remove Code Status:  full code Last  date of multidisciplinary goals of care discussion: sister and husband updated at the bedside.    Kipp Brood, MD [facility] ICU Physician [place]  Pager: [phone] Mobile: [phone] After hours: [phone].

## 2021-03-31 ENCOUNTER — Inpatient Hospital Stay (HOSPITAL_COMMUNITY): Payer: PRIVATE HEALTH INSURANCE

## 2021-03-31 LAB — BASIC METABOLIC PANEL
Anion gap: 10 (ref 5–15)
BUN: 56 mg/dL — ABNORMAL HIGH (ref 6–20)
CO2: 24 mmol/L (ref 22–32)
Calcium: 8.9 mg/dL (ref 8.9–10.3)
Chloride: 104 mmol/L (ref 98–111)
Creatinine, Ser: 2.46 mg/dL — ABNORMAL HIGH (ref 0.44–1.00)
GFR, Estimated: 22 mL/min — ABNORMAL LOW (ref >60)
Glucose, Bld: 125 mg/dL — ABNORMAL HIGH (ref 70–99)
Potassium: 3.8 mmol/L (ref 3.5–5.1)
Sodium: 138 mmol/L (ref 135–145)

## 2021-03-31 LAB — GLUCOSE, CAPILLARY
Glucose-Capillary: 111 mg/dL — ABNORMAL HIGH (ref 70–99)
Glucose-Capillary: 120 mg/dL — ABNORMAL HIGH (ref 70–99)
Glucose-Capillary: 124 mg/dL — ABNORMAL HIGH (ref 70–99)
Glucose-Capillary: 129 mg/dL — ABNORMAL HIGH (ref 70–99)
Glucose-Capillary: 136 mg/dL — ABNORMAL HIGH (ref 70–99)

## 2021-03-31 LAB — CBC
HCT: 30.2 % — ABNORMAL LOW (ref 36.0–46.0)
Hemoglobin: 9.2 g/dL — ABNORMAL LOW (ref 12.0–15.0)
MCH: 31.2 pg (ref 26.0–34.0)
MCHC: 30.5 g/dL (ref 30.0–36.0)
MCV: 102.4 fL — ABNORMAL HIGH (ref 80.0–100.0)
Platelets: 231 10*3/uL (ref 150–400)
RBC: 2.95 MIL/uL — ABNORMAL LOW (ref 3.87–5.11)
RDW: 18.7 % — ABNORMAL HIGH (ref 11.5–15.5)
WBC: 19.9 10*3/uL — ABNORMAL HIGH (ref 4.0–10.5)
nRBC: 0.5 % — ABNORMAL HIGH (ref 0.0–0.2)

## 2021-03-31 LAB — LACTIC ACID, PLASMA: Lactic Acid, Venous: 0.8 mmol/L (ref 0.5–1.9)

## 2021-03-31 MED ORDER — SODIUM CHLORIDE 0.9% FLUSH
3.0000 mL | Freq: Two times a day (BID) | INTRAVENOUS | Status: DC
  Administered 2021-03-31 (×2): 3 mL via INTRAVENOUS

## 2021-03-31 MED ORDER — CHLORHEXIDINE GLUCONATE 0.12 % MT SOLN
15.0000 mL | Freq: Two times a day (BID) | OROMUCOSAL | Status: DC
  Administered 2021-03-31 – 2021-04-02 (×5): 15 mL via OROMUCOSAL
  Filled 2021-03-31 (×6): qty 15

## 2021-03-31 MED ORDER — SODIUM CHLORIDE 0.9 % IV SOLN: 250.0000 mL | INTRAVENOUS | Status: DC | PRN

## 2021-03-31 MED ORDER — ORAL CARE MOUTH RINSE
15.0000 mL | Freq: Two times a day (BID) | OROMUCOSAL | Status: DC
  Administered 2021-03-31 – 2021-04-02 (×5): 15 mL via OROMUCOSAL

## 2021-03-31 MED ORDER — SODIUM CHLORIDE 0.9% FLUSH: 3.0000 mL | INTRAVENOUS | Status: DC | PRN

## 2021-03-31 MED ORDER — ~~LOC~~ CARDIAC SURGERY, PATIENT & FAMILY EDUCATION: Freq: Once | Status: AC

## 2021-03-31 NOTE — Progress Notes (Signed)
NAME:  Dorothy Harris, MRN:  [id number], DOB:  01-15-1961, LOS: 7 ADMISSION DATE:  03/24/2021, CONSULTATION DATE:  12/19 REFERRING MD:  Cyndia Bent, CHIEF COMPLAINT:  critical care medicine BP control and complex medical management    History of Present Illness:  This is a 60 year old female w/ hx as per below.. Presented to [facility] 12/18 w/ cc: chest pain and SOB. 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 Started on esmolol, seen by CVTS; went to OR where she underwent repair and replacement of ascending aorta and replacement of aortic arch w/ Hemashield graft. Returned to surgical ICU post op on 12/19. 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  Epigasatric hernia  Significant Hospital Events: Including procedures, antibiotic start and stop dates in addition to other pertinent events   12/18 admitted w/ type A dissection. Underwent: Median Sternotomy Anastomosis of 8 mm Hemashield graft to the right axillary artery. Extracorporeal circulation via the right axillary artery and right atrial appendage Replacement of aortic arch under deep hypothermic circulatory arrest with bypass of the innominate, left common carotid and left subclavian arteries using a 30 x 10 x 8 x 8 x 10 mm Hemashield Platinum graft.  Supra-coronary replacement of the ascending aorta. Cross clamp time of 169 minutes. Pt was noted to be coagulopathic intra-op  12/22 remains on cardene for hypertension.  12/24 finally off Cardene but complaining of abdominal pain.  General surgery evaluated her for a large abdominal hernia.  Will manage conservatively for now.   Interim History / Subjective:  Remains off nicardipine.  Managing hernia conservatively, general surgery following.   Patient is no longer complaining of abdominal pain.  Objective   Blood pressure 101/65, pulse 81, temperature 98.5 F (36.9 C), temperature source Oral, resp. rate 15, height $RemoveBe'5\' 7"'yAbOQTGDd$  (1.702 m), weight 97.2 kg, SpO2 90 %. CVP:  [10 mmHg-12 mmHg] 10 mmHg      Intake/Output Summary (Last 24 hours) at 03/31/2021 1049 Last data filed at 03/31/2021 0400 Gross per 24 hour  Intake 450 ml  Output 500 ml  Net -50 ml    Filed Weights   03/29/21 0500 03/30/21 0600 03/31/21 0500  Weight: 98.2 kg 97 kg 97.2 kg    Examination: General: Up in chair, appears comfortable. Off Cardene. HENT:  no skin break down,  Lungs: bronchial breath sounds are improving Cardiovascular: Sternal dressing intact. HS distant, no  rub.   Drains have been removed Abdomen: soft  Extremities: warm strong pulses, minimal edema.  Neuro: awake and oriented but poor speech and some gaps in insight.  No focal deficits. GU: Foley has been removed  Ancillary tests personally reviewed:  Creatinine 2..46-stable BNP remains elevated at 954 WBC 19  Assessment & Plan:  S/p AAA repair w/ replacement of ascending aorta and replacement of aortic arch w/ Hemashield graft.  Hypoxic respiratory insufficiency due to atelectasis CKD stage III, at risk for AKI  Post-op anemia H/o poorly controlled HTN with LVH Generalized deconditioning Large incisional hernia without bowel obstruction  Plan:  -Continue to ambulate, now down to 1 L of oxygen - Multimodal pain control -we will begin to taper.  Lidocaine patches stopped - BP better controlled now.  Off Cardene . KEEP SBP <  130.  - Monitor for worsening AKI - avoid NSAIDS  for pain -Transitioned to oral diuretics.  We will remove central line today. -Continue to manage hernia expectantly.  General surgery is following.  Continue to take medications orally with sips.  General surgery to advise redirect. -Ready to transfer to floor, PCCM will sign off.  Please reconsult if further  questions arise.  Best Practice (right click and "Reselect all SmartList Selections" daily)   Diet/type: Regular DVT prophylaxis: Subcutaneous heparin GI prophylaxis: Not indicated Lines: Remove introducer. Foley: Will remove Code Status:  full code Last date of multidisciplinary goals of care discussion: sister and husband updated at the bedside.    Kipp Brood, MD [facility] ICU Physician [place]  Pager: [phone] Mobile: [phone] After hours: [phone].

## 2021-03-31 NOTE — Plan of Care (Signed)
  Problem: Education: Goal: Knowledge of General Education information will improve Description Including pain rating scale, medication(s)/side effects and non-pharmacologic comfort measures Outcome: Progressing   

## 2021-03-31 NOTE — Progress Notes (Signed)
Pt unable to void.  Bladder scan:  447ml.  I/O cath with 465ml out.

## 2021-03-31 NOTE — Progress Notes (Signed)
° °   °  [address]       [employer] [id number]             [phone]                 7 Days Post-Op Procedure(s) (LRB): 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 USING HEMASHIELD PLATINUM  [id number] mm (N/A) INTRAOPERATIVE TRANSESOPHAGEAL ECHOCARDIOGRAM APPLICATION OF CELL SAVER   Events: No events _______________________________________________________________ Vitals: BP 128/79    Pulse 67    Temp 98.5 F (36.9 C) (Oral)    Resp 11    Ht $R'5\' 7"'WC$  (1.702 m)    Wt 97.2 kg    SpO2 95%    BMI 33.56 kg/m  Filed Weights   03/29/21 0500 03/30/21 0600 03/31/21 0500  Weight: 98.2 kg 97 kg 97.2 kg     - Neuro: alert NAD  - Cardiovascular: sinus  Drips: none CVP:  [10 mmHg-12 mmHg] 10 mmHg  - Pulm: EWOB    ABG    Component Value Date/Time   PHART 7.370 03/26/2021 1038   PCO2ART 41.8 03/26/2021 1038   PO2ART 76 (L) 03/26/2021 1038   HCO3 24.1 03/26/2021 1038   TCO2 25 03/26/2021 1038   ACIDBASEDEF 1.0 03/26/2021 1038   O2SAT 94.0 03/26/2021 1038    - Abd: ND. - Extremity: warm  .Intake/Output      12/24 0701 12/25 0700 12/25 0701 12/26 0700   P.O. 1170    I.V. (mL/kg) 36.1 (0.4)    Total Intake(mL/kg) 1206.1 (12.4)    Urine (mL/kg/hr) 750 (0.3)    Chest Tube     Total Output 750    Net +456.1            _______________________________________________________________ Labs: CBC Latest Ref Rng & Units 03/31/2021 03/30/2021 03/29/2021  WBC 4.0 - 10.5 K/uL 19.9(H) 19.0(H) 13.2(H)  Hemoglobin 12.0 - 15.0 g/dL 9.2(L) 8.9(L) 9.2(L)  Hematocrit 36.0 - 46.0 % 30.2(L) 27.8(L) 29.0(L)  Platelets 150 - 400 K/uL 231 180 153   CMP Latest Ref Rng & Units 03/31/2021 03/30/2021 03/29/2021  Glucose 70 - 99 mg/dL 125(H) 149(H) 126(H)  BUN 6 - 20 mg/dL 56(H) 45(H) 40(H)  Creatinine 0.44 - 1.00 mg/dL 2.46(H) 2.40(H) 2.26(H)  Sodium 135 - 145 mmol/L 138 136 137  Potassium 3.5 - 5.1 mmol/L 3.8 3.3(L) 3.7  Chloride 98 - 111 mmol/L 104 102 105   CO2 22 - 32 mmol/L $RemoveB'24 24 24  'xNYjwEFW$ Calcium 8.9 - 10.3 mg/dL 8.9 8.4(L) 8.4(L)  Total Protein 6.1 - 8.1 g/dL - - -  Total Bilirubin 0.2 - 1.2 mg/dL - - -  Alkaline Phos 38 - 126 U/L - - -  AST 10 - 35 U/L - - -  ALT 6 - 29 U/L - - -    CXR: -  _______________________________________________________________  Assessment and Plan: POD 7 s/p Type A/Arch repair  Neuro: pain controlled CV: d/c cardene today.  On amlodipine, hydralazine, lisinopriol, and metoprolol.   Pulm: wean O2.   Renal: creat slightly up.  On lisinopriol  holding lasix.  Will watch potassium GI: on diet, not eating much.  Chronic hernia.  stable Heme: stable ID: afebrile.  WBC up today.  Will follow Endo: SSI Dispo: floor   Lajuana Matte 03/31/2021 8:46 AM

## 2021-03-31 NOTE — Progress Notes (Addendum)
[facility] Progress Note  7 Days Post-Op  Subjective: CC:  NAEO. Pt sleeping during my exam - easily arouses to voice and light touch. Oriented to person, place, reports time as 2020. C/o abdominal soreness. Unable to report if hernia is larger than previous. Unsure if she is having flatus. Per RN no flatus, BM, or vomiting overnight. Pt asking for water.   AFVSS, WBC 19.9 from 19 BUN 56 from 45, Creatinine 2.46 from 2.40  Objective: Vital signs in last 24 hours: Temp:  [97.6 F (36.4 C)-98.6 F (37 C)] 98.5 F (36.9 C) (12/25 0720) Pulse Rate:  [60-77] 67 (12/25 0500) Resp:  [10-22] 11 (12/25 0500) BP: (101-162)/(63-108) 128/79 (12/25 0500) SpO2:  [89 %-100 %] 95 % (12/25 0500) Weight:  [97.2 kg] 97.2 kg (12/25 0500) Last BM Date:  (PTA)  Intake/Output from previous day: 12/24 0701 - 12/25 0700 In: 1206.1 [P.O.:1170; I.V.:36.1] Out: 750 [Urine:750] Intake/Output this shift: No intake/output data recorded.  PE: Gen:  somnolent, NAD, pleasant Card:  Regular rate and rhythm, pedal pulses 2+ BL Pulm:  Normal effort ORA, clear to auscultation bilaterally Abd: previous laparotomy scar, large incarcerated ventral hernia without overlying skin changes. The hernia is firm and slightly tender, the rest of her abdomen is soft and nondistended. Skin: warm and dry, no rashes  Psych: A&Ox3   Lab Results:  Recent Labs    03/30/21 0439 03/31/21 0218  WBC 19.0* 19.9*  HGB 8.9* 9.2*  HCT 27.8* 30.2*  PLT 180 231   BMET Recent Labs    03/30/21 0439 03/31/21 0218  NA 136 138  K 3.3* 3.8  CL 102 104  CO2 24 24  GLUCOSE 149* 125*  BUN 45* 56*  CREATININE 2.40* 2.46*  CALCIUM 8.4* 8.9   PT/INR No results for input(s): LABPROT, INR in the last 72 hours. CMP     Component Value Date/Time   NA 138 03/31/2021 0218   K 3.8 03/31/2021 0218   CL 104 03/31/2021 0218   CO2 24 03/31/2021 0218   GLUCOSE 125 (H) 03/31/2021 0218   BUN 56 (H) 03/31/2021 0218    CREATININE 2.46 (H) 03/31/2021 0218   CREATININE 1.69 (H) 08/21/2017 0935   CALCIUM 8.9 03/31/2021 0218   PROT 7.7 08/21/2017 0935   ALBUMIN 4.4 05/31/2017 0635   AST 15 08/21/2017 0935   ALT 10 08/21/2017 0935   ALKPHOS 99 05/31/2017 0635   BILITOT 0.7 08/21/2017 0935   GFRNONAA 22 (L) 03/31/2021 0218   GFRNONAA 33 (L) 08/21/2017 0935   GFRAA 53 (L) 10/07/2017 0720   GFRAA 39 (L) 08/21/2017 0935   Lipase  No results found for: LIPASE     Studies/Results: DG Abd 1 View  Result Date: 03/30/2021 CLINICAL DATA:  Obstruction of bowel EXAM: ABDOMEN - 1 VIEW COMPARISON:  06/03/2017 FINDINGS: Gas throughout large and small bowel. Mild prominence of the right colon and hepatic flexure. No organomegaly or free air. IMPRESSION: Gas within nondistended small bowel and mildly prominent right colon. Electronically Signed   By: Charlett Nose M.D.   On: 03/30/2021 12:10   DG CHEST PORT 1 VIEW  Result Date: 03/30/2021 CLINICAL DATA:  Pleural effusion EXAM: PORTABLE CHEST 1 VIEW COMPARISON:  03/29/2021 FINDINGS: Cardiomegaly. Prior CABG. Interval removal of mediastinal drain. Tortuous, prominent aorta is unchanged. Vascular congestion and bibasilar atelectasis. No effusions or pneumothorax. IMPRESSION: Postoperative changes.  Bibasilar atelectasis.  No pneumothorax. Cardiomegaly, vascular congestion. Electronically Signed   By: Charlett Nose M.D.  On: 03/30/2021 06:36    Anti-infectives: Anti-infectives (From admission, onward)    Start     Dose/Rate Route Frequency Ordered Stop   03/25/21 1200  vancomycin (VANCOCIN) IVPB 1000 mg/200 mL premix        1,000 mg 200 mL/hr over 60 Minutes Intravenous  Once 03/25/21 0512 03/25/21 1206   03/25/21 0600  ceFAZolin (ANCEF) IVPB 2g/100 mL premix        2 g 200 mL/hr over 30 Minutes Intravenous Every 8 hours 03/25/21 0512 03/26/21 2217   03/24/21 1800  ceFAZolin (ANCEF) IVPB 2g/100 mL premix        2 g 200 mL/hr over 30 Minutes Intravenous To Surgery  03/24/21 1738 03/25/21 0308   03/24/21 1800  ceFAZolin (ANCEF) IVPB 2g/100 mL premix  Status:  Discontinued        2 g 200 mL/hr over 30 Minutes Intravenous To Surgery 03/24/21 1734 03/25/21 0512   03/24/21 1800  vancomycin (VANCOREADY) IVPB 1250 mg/250 mL        1,250 mg 166.7 mL/hr over 90 Minutes Intravenous To Surgery 03/24/21 1738 03/24/21 2114        Assessment/Plan Chronically incarcerated ventral hernia, without signs of bowel obstruction - no emergent general surgery needs. Patient is a poor historian which makes it hard to determine if there has been an acute change in hernia. Re-check KUB today. Continue non-operative management and abdominal exams.  - Surgery would likely require extensive adhesiolysis based on prior operative reports, and the patient is likely higher risk for surgery given very recent aneurysm repair.  - continue NPO for now and check KUB, may be able to advance diet later this morning.   Ascending aortic dissection s/p Type A/Arch repair 03/24/21 Dr. Cyndia Bent  CKD III HTN   FEN: NPO, IVF, sips with meds ok  VTE: SCD's, SQH ID: none  Dispo: ICU    LOS: 7 days    Obie Dredge, [facility] Surgery Please see Amion for pager number during day hours 7:00am-4:30pm

## 2021-04-01 LAB — BASIC METABOLIC PANEL
Anion gap: 13 (ref 5–15)
BUN: 64 mg/dL — ABNORMAL HIGH (ref 6–20)
CO2: 22 mmol/L (ref 22–32)
Calcium: 8.9 mg/dL (ref 8.9–10.3)
Chloride: 106 mmol/L (ref 98–111)
Creatinine, Ser: 2.64 mg/dL — ABNORMAL HIGH (ref 0.44–1.00)
GFR, Estimated: 20 mL/min — ABNORMAL LOW (ref >60)
Glucose, Bld: 122 mg/dL — ABNORMAL HIGH (ref 70–99)
Potassium: 3.9 mmol/L (ref 3.5–5.1)
Sodium: 141 mmol/L (ref 135–145)

## 2021-04-01 LAB — CBC
HCT: 28.5 % — ABNORMAL LOW (ref 36.0–46.0)
Hemoglobin: 9.2 g/dL — ABNORMAL LOW (ref 12.0–15.0)
MCH: 32.3 pg (ref 26.0–34.0)
MCHC: 32.3 g/dL (ref 30.0–36.0)
MCV: 100 fL (ref 80.0–100.0)
Platelets: 286 10*3/uL (ref 150–400)
RBC: 2.85 MIL/uL — ABNORMAL LOW (ref 3.87–5.11)
RDW: 18.6 % — ABNORMAL HIGH (ref 11.5–15.5)
WBC: 20.6 10*3/uL — ABNORMAL HIGH (ref 4.0–10.5)
nRBC: 0.6 % — ABNORMAL HIGH (ref 0.0–0.2)

## 2021-04-01 LAB — GLUCOSE, CAPILLARY
Glucose-Capillary: 122 mg/dL — ABNORMAL HIGH (ref 70–99)
Glucose-Capillary: 122 mg/dL — ABNORMAL HIGH (ref 70–99)
Glucose-Capillary: 118 mg/dL — ABNORMAL HIGH (ref 70–99)
Glucose-Capillary: 113 mg/dL — ABNORMAL HIGH (ref 70–99)
Glucose-Capillary: 140 mg/dL — ABNORMAL HIGH (ref 70–99)
Glucose-Capillary: 138 mg/dL — ABNORMAL HIGH (ref 70–99)

## 2021-04-01 NOTE — Progress Notes (Signed)
Physical Therapy Treatment Patient Details Name: Dorothy Harris MRN: [id number] DOB: 08-15-1960 Today's Date: 04/01/2021   History of Present Illness The pt is a 60 yo female presenting 12/18 with c/o chest pain while cleaning house, given 2 NG on arrival with improvement in sx. 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 Extubated 12/20. PMH includes: CAD, NSTEMI, descending thoracic dissection for which pt has not followed up with vascular surgery, HTN, and current tobacco use.    PT Comments    The pt was able to make great progress this afternoon with hallway ambulation. She continues to be most challenged by sit-stand transfers, as she has poor anterior wt shift or power through BLE. Therefore, the pt required modA of 2 to correct strong posterior lean. The pt also continues to need max cues for stride length and clearance, but continues to make slow and steady progress with weaning from O2 and ambulation distance.   SpO2 94% on 2L with gait (to 84% with 1L)   Recommendations for follow up therapy are one component of a multi-disciplinary discharge planning process, led by the attending physician.  Recommendations may be updated based on patient status, additional functional criteria and insurance authorization.  Follow Up Recommendations  Skilled nursing-short term rehab (<3 hours/day)     Assistance Recommended at Discharge Frequent or constant Supervision/Assistance  Equipment Recommendations  Other (comment) (defer to post acute)    Recommendations for Other Services       Precautions / Restrictions Precautions Precautions: Sternal;Fall Precaution Comments: watch O2 (on 2L during session) Restrictions Weight Bearing Restrictions: Yes Other Position/Activity Restrictions: sternal precautions     Mobility  Bed Mobility Overal bed mobility: Needs  Assistance Bed Mobility: Supine to Sit     Supine to sit: Mod assist     General bed mobility comments: modA to avoid pulling with UE    Transfers Overall transfer level: Needs assistance Equipment used: Rolling walker (2 wheels) Transfers: Sit to/from Stand Sit to Stand: Mod assist;+2 physical assistance           General transfer comment: pt with poor forward momentum and needing modA due to posterior lean    Ambulation/Gait Ambulation/Gait assistance: Min guard;+2 safety/equipment (chair follow) Gait Distance (Feet): 40 Feet (+ 60ft + 30 ft) Assistive device: Rolling walker (2 wheels) Gait Pattern/deviations: Step-to pattern;Decreased step length - left;Shuffle Gait velocity: decreased Gait velocity interpretation: <1.31 ft/sec, indicative of household ambulator   General Gait Details: pt able to continue with small shuffling steps, max encouragement for step length       Balance Overall balance assessment: Needs assistance Sitting-balance support: No upper extremity supported;Feet supported Sitting balance-Leahy Scale: Fair     Standing balance support: Bilateral upper extremity supported;During functional activity;Reliant on assistive device for balance Standing balance-Leahy Scale: Poor Standing balance comment: dependent on BUE support                            Cognition Arousal/Alertness: Awake/alert Behavior During Therapy: Flat affect Overall Cognitive Status: Within Functional Limits for tasks assessed Area of Impairment: Following commands                       Following Commands: Follows one step commands with increased time       General Comments: pt continues  with slightly delayed response, but generally able to follow all commands and demos some carryover from prior sessions        Exercises      General Comments General comments (skin integrity, edema, etc.): VSS on 2L with gait      Pertinent Vitals/Pain Pain  Assessment: Faces Faces Pain Scale: Hurts little more Pain Location: incision Pain Descriptors / Indicators: Discomfort;Grimacing Pain Intervention(s): Limited activity within patient's tolerance;Monitored during session;Repositioned     PT Goals (current goals can now be found in the care plan section) Acute Rehab PT Goals Patient Stated Goal: to reduce pain and eventually be able to return home PT Goal Formulation: With patient Time For Goal Achievement: 04/11/21 Potential to Achieve Goals: Good Progress towards PT goals: Progressing toward goals    Frequency    Min 3X/week      PT Plan Current plan remains appropriate       AM-PAC PT "6 Clicks" Mobility   Outcome Measure  Help needed turning from your back to your side while in a flat bed without using bedrails?: A Lot Help needed moving from lying on your back to sitting on the side of a flat bed without using bedrails?: A Lot Help needed moving to and from a bed to a chair (including a wheelchair)?: Total Help needed standing up from a chair using your arms (e.g., wheelchair or bedside chair)?: Total Help needed to walk in hospital room?: A Little Help needed climbing 3-5 steps with a railing? : A Lot 6 Click Score: 11    End of Session         PT Visit Diagnosis: Other abnormalities of gait and mobility (R26.89);Muscle weakness (generalized) (M62.81)     Time: 0156-1537 PT Time Calculation (min) (ACUTE ONLY): 33 min  Charges:  $Therapeutic Exercise: 23-37 mins                     West Carbo, PT, DPT   Acute Rehabilitation Department Pager #: [phone]   Sandra Cockayne 04/01/2021, 3:58 PM

## 2021-04-01 NOTE — Progress Notes (Signed)
Sister - Delman Cheadle [phone] is assisting w/ decisions. She would like a provider to give her a call with any updates.  Thanks.

## 2021-04-01 NOTE — Progress Notes (Signed)
[facility] Progress Note  8 Days Post-Op  Subjective: CC:  NAEO. More alert today - reports she has had this hernia for a few years and it is always in a 'stuck position.' Denies n/v. Reports she is passing flatus.  AFVSS, WBC remains at 20. BUN 56 from 45, Creatinine 2.46 from 2.40  Objective: Vital signs in last 24 hours: Temp:  [98.2 F (36.8 C)-99.5 F (37.5 C)] 98.2 F (36.8 C) (12/26 0808) Pulse Rate:  [63-88] 64 (12/26 0401) Resp:  [11-25] 12 (12/26 0401) BP: (101-149)/(65-83) 135/81 (12/26 0808) SpO2:  [86 %-98 %] 94 % (12/26 0401) Weight:  [96.6 kg] 96.6 kg (12/26 0351) Last BM Date:  (PTA)  Intake/Output from previous day: 12/25 0701 - 12/26 0700 In: 150 [P.O.:150] Out: 750 [Urine:750] Intake/Output this shift: No intake/output data recorded.  PE: Gen:  somnolent, NAD, pleasant Card:  Regular rate and rhythm, pedal pulses 2+ BL Pulm:  Normal effort ORA, clear to auscultation bilaterally Abd: previous laparotomy scar, large incarcerated ventral hernia without overlying skin changes or erythema Skin: warm and dry, no rashes  Psych: A&Ox3   Lab Results:  Recent Labs    03/31/21 0218 04/01/21 0400  WBC 19.9* 20.6*  HGB 9.2* 9.2*  HCT 30.2* 28.5*  PLT 231 286   BMET Recent Labs    03/31/21 0218 04/01/21 0400  NA 138 141  K 3.8 3.9  CL 104 106  CO2 24 22  GLUCOSE 125* 122*  BUN 56* 64*  CREATININE 2.46* 2.64*  CALCIUM 8.9 8.9   PT/INR No results for input(s): LABPROT, INR in the last 72 hours. CMP     Component Value Date/Time   NA 141 04/01/2021 0400   K 3.9 04/01/2021 0400   CL 106 04/01/2021 0400   CO2 22 04/01/2021 0400   GLUCOSE 122 (H) 04/01/2021 0400   BUN 64 (H) 04/01/2021 0400   CREATININE 2.64 (H) 04/01/2021 0400   CREATININE 1.69 (H) 08/21/2017 0935   CALCIUM 8.9 04/01/2021 0400   PROT 7.7 08/21/2017 0935   ALBUMIN 4.4 05/31/2017 0635   AST 15 08/21/2017 0935   ALT 10 08/21/2017 0935   ALKPHOS 99 05/31/2017  0635   BILITOT 0.7 08/21/2017 0935   GFRNONAA 20 (L) 04/01/2021 0400   GFRNONAA 33 (L) 08/21/2017 0935   GFRAA 53 (L) 10/07/2017 0720   GFRAA 39 (L) 08/21/2017 0935   Lipase  No results found for: LIPASE     Studies/Results: DG Abd 1 View  Result Date: 03/30/2021 CLINICAL DATA:  Obstruction of bowel EXAM: ABDOMEN - 1 VIEW COMPARISON:  06/03/2017 FINDINGS: Gas throughout large and small bowel. Mild prominence of the right colon and hepatic flexure. No organomegaly or free air. IMPRESSION: Gas within nondistended small bowel and mildly prominent right colon. Electronically Signed   By: Rolm Baptise M.D.   On: 03/30/2021 12:10   DG Abd Portable 1V  Result Date: 03/31/2021 CLINICAL DATA:  Abdominal pain EXAM: PORTABLE ABDOMEN - 1 VIEW COMPARISON:  03/30/2021 FINDINGS: Stomach is not distended. There is mild dilation of small-bowel loops. There is increase in the amount of small bowel gas. Gas is present in colon. IMPRESSION: Interval increase in amount of gas in small bowel loops may suggest ileus. Electronically Signed   By: Elmer Picker M.D.   On: 03/31/2021 09:10    Anti-infectives: Anti-infectives (From admission, onward)    Start     Dose/Rate Route Frequency Ordered Stop   03/25/21 1200  vancomycin (VANCOCIN)  IVPB 1000 mg/200 mL premix        1,000 mg 200 mL/hr over 60 Minutes Intravenous  Once 03/25/21 0512 03/25/21 1206   03/25/21 0600  ceFAZolin (ANCEF) IVPB 2g/100 mL premix        2 g 200 mL/hr over 30 Minutes Intravenous Every 8 hours 03/25/21 0512 03/26/21 2217   03/24/21 1800  ceFAZolin (ANCEF) IVPB 2g/100 mL premix        2 g 200 mL/hr over 30 Minutes Intravenous To Surgery 03/24/21 1738 03/25/21 0308   03/24/21 1800  ceFAZolin (ANCEF) IVPB 2g/100 mL premix  Status:  Discontinued        2 g 200 mL/hr over 30 Minutes Intravenous To Surgery 03/24/21 1734 03/25/21 0512   03/24/21 1800  vancomycin (VANCOREADY) IVPB 1250 mg/250 mL        1,250 mg 166.7 mL/hr over  90 Minutes Intravenous To Surgery 03/24/21 1738 03/24/21 2114        Assessment/Plan Chronically incarcerated ventral hernia, without signs of bowel obstruction - no emergent general surgery needs. Repeat KUB tomorrow AM. - Surgery would likely require extensive adhesiolysis based on prior operative reports, and the patient is likely higher risk for surgery given very recent aneurysm repair.  - Sips of liquids, MIVF; give enema from below to ensure no distal constipation either - soap suds  Ascending aortic dissection s/p Type A/Arch repair 03/24/21 Dr. Cyndia Bent  CKD III HTN   FEN: NPO, IVF, sips with meds ok  VTE: SCD's, SQH ID: none  Dispo: ICU    LOS: 8 days   Nadeen Landau, MD [facility] Surgery, [place]

## 2021-04-01 NOTE — Progress Notes (Signed)
Pt had no complaints of abdominal pain overnight. She denied passing  any gas. Afebrile, hemodynamics stable. SBP was  well controlled lower than 130 per order. On room air SPO2 85%. We kept O2 NCL 1-2 LPM with SPO2 94-98%. EKG-sinus rhythm on the monitor. Pt tolerated pain well with Oxycodone PRN. Able to ambulate with 1-2 assist to bedside commode.  Pt stated her last BM was prior this admission.(03/24/21) Pt has been NPO since pre-op per order. Pt requested some water. She tolerated oral intake with sips of water and medications well. Denied nausea and vomiting. Abdominal hernia appeared to be the same size.   Urine output documented total 750 ml in the past 24 hours.  Sternal incision and right upper chest incision dry and clean, no drainage. Wounds cleaned with Betadine.  No immediate distress overnight. We will continue to monitor.  Kennyth Lose, RN

## 2021-04-01 NOTE — Progress Notes (Signed)
Soap suds enema given per MD order was unsuccessful.

## 2021-04-01 NOTE — Plan of Care (Signed)
  Problem: Education: Goal: Knowledge of General Education information will improve Description: Including pain rating scale, medication(s)/side effects and non-pharmacologic comfort measures Outcome: Not Progressing   

## 2021-04-01 NOTE — Progress Notes (Addendum)
° °   °  [address]        [place] [address]             [phone]      8 Days Post-Op Procedure(s) (LRB): 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 USING HEMASHIELD PLATINUM  [id number] mm (N/A) INTRAOPERATIVE TRANSESOPHAGEAL ECHOCARDIOGRAM APPLICATION OF CELL SAVER  Subjective:  Patient not very communicative this morning.  Does admit to back discomfort from laying on some chords in her bed.  She otherwise is a poor historian.  She is unsure if she has passing gas  Objective: Vital signs in last 24 hours: Temp:  [98.2 F (36.8 C)-99.5 F (37.5 C)] 99.5 F (37.5 C) (12/26 0401) Pulse Rate:  [63-88] 64 (12/26 0401) Cardiac Rhythm: Normal sinus rhythm (12/26 0401) Resp:  [11-25] 12 (12/26 0401) BP: (101-149)/(65-83) 108/69 (12/26 0401) SpO2:  [86 %-98 %] 94 % (12/26 0401) Weight:  [96.6 kg] 96.6 kg (12/26 0351)  Intake/Output from previous day: 12/25 0701 - 12/26 0700 In: 150 [P.O.:150] Out: 750 [Urine:750]  General appearance: no distress Heart: regular rate and rhythm Lungs: clear to auscultation bilaterally Abdomen: soft, large ventral hernia present along previous midline incision, BS are present Extremities: edema trace Wound: clean and dry  Lab Results: Recent Labs    03/31/21 0218 04/01/21 0400  WBC 19.9* 20.6*  HGB 9.2* 9.2*  HCT 30.2* 28.5*  PLT 231 286   BMET:  Recent Labs    03/31/21 0218 04/01/21 0400  NA 138 141  K 3.8 3.9  CL 104 106  CO2 24 22  GLUCOSE 125* 122*  BUN 56* 64*  CREATININE 2.46* 2.64*  CALCIUM 8.9 8.9    PT/INR: No results for input(s): LABPROT, INR in the last 72 hours. ABG    Component Value Date/Time   PHART 7.370 03/26/2021 1038   HCO3 24.1 03/26/2021 1038   TCO2 25 03/26/2021 1038   ACIDBASEDEF 1.0 03/26/2021 1038   O2SAT 94.0 03/26/2021 1038   CBG (last 3)  Recent Labs    03/31/21 2030 03/31/21 2336 04/01/21 0355  GLUCAP 124* 111* 122*    Assessment/Plan: S/P  Procedure(s) (LRB): 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 USING HEMASHIELD PLATINUM  [id number] mm (N/A) INTRAOPERATIVE TRANSESOPHAGEAL ECHOCARDIOGRAM APPLICATION OF CELL SAVER  CV- NSR, BP is well controlled- Norvasc, Hydralazine, and Zestril, Lopressor Pulm- no acute issues, off oxygen, continue IS Renal-CKD Stg III creatinine level is up to 2.64, suspect this is related to her aortic dissection, will stop ACE GI- Large Incarcerated visceral hernia,  per Gen Surgery,.. KUB yesterday shows evidence of possible ileus.. she is NPO Deconditioning- patient has worked with PT/OT, they recommended SNF placement, patient is un-insured which will make placement difficult ID- leukocytosis is slowly trending up, no acute signs of infection, monitor Dispo- patient stable, BP is well controlled on current regimen, she has a history of CKD Stg 3- creatinine up to 2.64, on Lisinopril which was started on Friday, slowly increasing leukocytosis, ? Source?.. Gen surgery following hernia   LOS: 8 days    Ellwood Handler, PA-C 04/01/2021  Agree with above Titrating BP meds Needs more PT, but not participating  Lajuana Matte

## 2021-04-01 NOTE — Progress Notes (Signed)
Mobility Specialist: Progress Note   04/01/21 1536  Mobility  Activity Ambulated in hall  Level of Assistance +2 (takes two people)  Assistive Device Front wheel walker  Mobility Ambulated with assistance in hallway  Mobility Response Tolerated well  Mobility performed by Mobility specialist;Other (comment) (PT Katie)  [place] Specialist Mobility Specialist [place]: [place]: [phone]

## 2021-04-02 ENCOUNTER — Inpatient Hospital Stay (HOSPITAL_COMMUNITY): Payer: PRIVATE HEALTH INSURANCE | Admitting: Anesthesiology

## 2021-04-02 ENCOUNTER — Inpatient Hospital Stay (HOSPITAL_COMMUNITY): Payer: PRIVATE HEALTH INSURANCE

## 2021-04-02 ENCOUNTER — Encounter (HOSPITAL_COMMUNITY): Payer: Self-pay | Admitting: Surgery

## 2021-04-02 ENCOUNTER — Encounter (HOSPITAL_COMMUNITY)
Admission: EM | Disposition: A | Discharge status: Rehabilitation Facility | Payer: Self-pay | Source: Home / Self Care | Attending: Surgery

## 2021-04-02 ENCOUNTER — Inpatient Hospital Stay: Payer: Self-pay

## 2021-04-02 DIAGNOSIS — Z5189 Encounter for other specified aftercare: Secondary | ICD-10-CM

## 2021-04-02 DIAGNOSIS — K659 Peritonitis, unspecified: Secondary | ICD-10-CM | POA: Diagnosis present

## 2021-04-02 DIAGNOSIS — N179 Acute kidney failure, unspecified: Secondary | ICD-10-CM | POA: Diagnosis not present

## 2021-04-02 DIAGNOSIS — Z978 Presence of other specified devices: Secondary | ICD-10-CM | POA: Diagnosis not present

## 2021-04-02 HISTORY — PX: LYSIS OF ADHESION: SHX5961

## 2021-04-02 HISTORY — PX: ILEOSTOMY: SHX1783

## 2021-04-02 HISTORY — PX: VENTRAL HERNIA REPAIR: SHX424

## 2021-04-02 HISTORY — PX: LAPAROTOMY: SHX154

## 2021-04-02 HISTORY — PX: COLON RESECTION: SHX5231

## 2021-04-02 LAB — GLUCOSE, CAPILLARY
Glucose-Capillary: 122 mg/dL — ABNORMAL HIGH (ref 70–99)
Glucose-Capillary: 122 mg/dL — ABNORMAL HIGH (ref 70–99)
Glucose-Capillary: 128 mg/dL — ABNORMAL HIGH (ref 70–99)
Glucose-Capillary: 128 mg/dL — ABNORMAL HIGH (ref 70–99)
Glucose-Capillary: 132 mg/dL — ABNORMAL HIGH (ref 70–99)
Glucose-Capillary: 134 mg/dL — ABNORMAL HIGH (ref 70–99)
Glucose-Capillary: 137 mg/dL — ABNORMAL HIGH (ref 70–99)

## 2021-04-02 LAB — BASIC METABOLIC PANEL
Anion gap: 12 (ref 5–15)
BUN: 68 mg/dL — ABNORMAL HIGH (ref 6–20)
CO2: 23 mmol/L (ref 22–32)
Calcium: 8.8 mg/dL — ABNORMAL LOW (ref 8.9–10.3)
Chloride: 105 mmol/L (ref 98–111)
Creatinine, Ser: 2.59 mg/dL — ABNORMAL HIGH (ref 0.44–1.00)
GFR, Estimated: 21 mL/min — ABNORMAL LOW (ref >60)
Glucose, Bld: 114 mg/dL — ABNORMAL HIGH (ref 70–99)
Potassium: 3.9 mmol/L (ref 3.5–5.1)
Sodium: 140 mmol/L (ref 135–145)

## 2021-04-02 LAB — POCT I-STAT 7, (LYTES, BLD GAS, ICA,H+H)
Acid-base deficit: 4 mmol/L — ABNORMAL HIGH (ref 0.0–2.0)
Bicarbonate: 20.7 mmol/L (ref 20.0–28.0)
Calcium, Ion: 1.17 mmol/L (ref 1.15–1.40)
HCT: 27 % — ABNORMAL LOW (ref 36.0–46.0)
Hemoglobin: 9.2 g/dL — ABNORMAL LOW (ref 12.0–15.0)
O2 Saturation: 91 %
Patient temperature: 37.2
Potassium: 3.9 mmol/L (ref 3.5–5.1)
Sodium: 138 mmol/L (ref 135–145)
TCO2: 22 mmol/L (ref 22–32)
pCO2 arterial: 35.1 mmHg (ref 32.0–48.0)
pH, Arterial: 7.379 (ref 7.350–7.450)
pO2, Arterial: 63 mmHg — ABNORMAL LOW (ref 83.0–108.0)

## 2021-04-02 SURGERY — LAPAROTOMY, EXPLORATORY
Anesthesia: General | Site: Abdomen

## 2021-04-02 MED ORDER — VANCOMYCIN HCL 2000 MG/400ML IV SOLN
2000.0000 mg | Freq: Once | INTRAVENOUS | Status: AC
  Administered 2021-04-02: 2000 mg via INTRAVENOUS
  Filled 2021-04-02: qty 400

## 2021-04-02 MED ORDER — CHLORHEXIDINE GLUCONATE 0.12% ORAL RINSE (MEDLINE KIT)
15.0000 mL | Freq: Two times a day (BID) | OROMUCOSAL | Status: DC
  Administered 2021-04-02 – 2021-04-04 (×4): 15 mL via OROMUCOSAL

## 2021-04-02 MED ORDER — FENTANYL CITRATE PF 50 MCG/ML IJ SOSY: 50.0000 ug | PREFILLED_SYRINGE | Freq: Once | INTRAMUSCULAR | Status: DC

## 2021-04-02 MED ORDER — POLYETHYLENE GLYCOL 3350 17 G PO PACK: 17.0000 g | PACK | Freq: Every day | ORAL | Status: DC

## 2021-04-02 MED ORDER — IOHEXOL 9 MG/ML PO SOLN
ORAL | Status: AC
  Administered 2021-04-02: 11:00:00 1000 mL
  Filled 2021-04-02: qty 1000

## 2021-04-02 MED ORDER — ASPIRIN 300 MG RE SUPP
300.0000 mg | Freq: Every day | RECTAL | Status: DC
  Administered 2021-04-03 – 2021-04-04 (×2): 300 mg via RECTAL
  Filled 2021-04-02 (×2): qty 1

## 2021-04-02 MED ORDER — FENTANYL BOLUS VIA INFUSION
50.0000 ug | INTRAVENOUS | Status: DC | PRN
  Administered 2021-04-02: 22:00:00 50 ug via INTRAVENOUS
  Filled 2021-04-02: qty 100

## 2021-04-02 MED ORDER — VANCOMYCIN VARIABLE DOSE PER UNSTABLE RENAL FUNCTION (PHARMACIST DOSING): Status: DC

## 2021-04-02 MED ORDER — ROCURONIUM BROMIDE 10 MG/ML (PF) SYRINGE
PREFILLED_SYRINGE | INTRAVENOUS | Status: DC | PRN
  Administered 2021-04-02: 100 mg via INTRAVENOUS
  Administered 2021-04-02: 50 mg via INTRAVENOUS

## 2021-04-02 MED ORDER — LIDOCAINE 2% (20 MG/ML) 5 ML SYRINGE
INTRAMUSCULAR | Status: DC | PRN
  Administered 2021-04-02: 100 mg via INTRAVENOUS

## 2021-04-02 MED ORDER — SODIUM CHLORIDE 0.9 % IV SOLN: INTRAVENOUS | Status: DC

## 2021-04-02 MED ORDER — ORAL CARE MOUTH RINSE
15.0000 mL | OROMUCOSAL | Status: DC
  Administered 2021-04-02 – 2021-04-04 (×15): 15 mL via OROMUCOSAL

## 2021-04-02 MED ORDER — FENTANYL 2500MCG IN NS 250ML (10MCG/ML) PREMIX INFUSION
50.0000 ug/h | INTRAVENOUS | Status: DC
Start: 2021-04-02 — End: 2021-04-04
  Administered 2021-04-02: 22:00:00 100 ug/h via INTRAVENOUS
  Administered 2021-04-03: 20:00:00 150 ug/h via INTRAVENOUS
  Filled 2021-04-02 (×2): qty 250

## 2021-04-02 MED ORDER — PROPOFOL 1000 MG/100ML IV EMUL: 0.0000 ug/kg/min | INTRAVENOUS | Status: DC

## 2021-04-02 MED ORDER — DOCUSATE SODIUM 50 MG/5ML PO LIQD
100.0000 mg | Freq: Two times a day (BID) | ORAL | Status: DC
  Filled 2021-04-02: qty 10

## 2021-04-02 MED ORDER — DEXTROSE IN LACTATED RINGERS 5 % IV SOLN: INTRAVENOUS | Status: AC

## 2021-04-02 MED ORDER — PHENYLEPHRINE 40 MCG/ML (10ML) SYRINGE FOR IV PUSH (FOR BLOOD PRESSURE SUPPORT)
PREFILLED_SYRINGE | INTRAVENOUS | Status: DC | PRN
  Administered 2021-04-02 (×2): 40 ug via INTRAVENOUS
  Administered 2021-04-02: 80 ug via INTRAVENOUS

## 2021-04-02 MED ORDER — FENTANYL CITRATE (PF) 250 MCG/5ML IJ SOLN
INTRAMUSCULAR | Status: AC
  Filled 2021-04-02: qty 5

## 2021-04-02 MED ORDER — PIPERACILLIN-TAZOBACTAM 3.375 G IVPB
3.3750 g | Freq: Three times a day (TID) | INTRAVENOUS | Status: DC
  Administered 2021-04-03 – 2021-04-09 (×20): 3.375 g via INTRAVENOUS
  Filled 2021-04-02 (×21): qty 50

## 2021-04-02 MED ORDER — PROPOFOL 1000 MG/100ML IV EMUL
0.0000 ug/kg/min | INTRAVENOUS | Status: DC
  Administered 2021-04-02: 22:00:00 30 ug/kg/min via INTRAVENOUS
  Administered 2021-04-03: 02:00:00 20 ug/kg/min via INTRAVENOUS
  Filled 2021-04-02: qty 100

## 2021-04-02 MED ORDER — PROPOFOL 10 MG/ML IV BOLUS
INTRAVENOUS | Status: DC | PRN
  Administered 2021-04-02: 140 mg via INTRAVENOUS

## 2021-04-02 MED ORDER — PHENYLEPHRINE HCL-NACL 20-0.9 MG/250ML-% IV SOLN
INTRAVENOUS | Status: DC | PRN
  Administered 2021-04-02: 30 ug/min via INTRAVENOUS

## 2021-04-02 MED ORDER — ORAL CARE MOUTH RINSE: 15.0000 mL | Freq: Once | OROMUCOSAL | Status: AC

## 2021-04-02 MED ORDER — LACTATED RINGERS IV SOLN: INTRAVENOUS | Status: DC | PRN

## 2021-04-02 MED ORDER — PANTOPRAZOLE SODIUM 40 MG IV SOLR
40.0000 mg | Freq: Every day | INTRAVENOUS | Status: DC
  Administered 2021-04-02 – 2021-04-04 (×3): 40 mg via INTRAVENOUS
  Filled 2021-04-02 (×3): qty 40

## 2021-04-02 MED ORDER — LACTATED RINGERS IV BOLUS
1000.0000 mL | Freq: Once | INTRAVENOUS | Status: AC
  Administered 2021-04-02: 22:00:00 1000 mL via INTRAVENOUS

## 2021-04-02 MED ORDER — FENTANYL BOLUS VIA INFUSION: 50.0000 ug | INTRAVENOUS | Status: DC | PRN

## 2021-04-02 MED ORDER — FENTANYL CITRATE (PF) 250 MCG/5ML IJ SOLN
INTRAMUSCULAR | Status: DC | PRN
  Administered 2021-04-02: 100 ug via INTRAVENOUS
  Administered 2021-04-02 (×8): 50 ug via INTRAVENOUS

## 2021-04-02 MED ORDER — CHLORHEXIDINE GLUCONATE 0.12 % MT SOLN
15.0000 mL | Freq: Once | OROMUCOSAL | Status: AC
  Administered 2021-04-02: 17:00:00 15 mL via OROMUCOSAL

## 2021-04-02 MED ORDER — ALBUTEROL SULFATE (2.5 MG/3ML) 0.083% IN NEBU
2.5000 mg | INHALATION_SOLUTION | Freq: Four times a day (QID) | RESPIRATORY_TRACT | Status: DC
  Administered 2021-04-03 (×2): 2.5 mg via RESPIRATORY_TRACT
  Filled 2021-04-02 (×2): qty 3

## 2021-04-02 MED ORDER — 0.9 % SODIUM CHLORIDE (POUR BTL) OPTIME
TOPICAL | Status: DC | PRN
  Administered 2021-04-02: 20:00:00 5000 mL

## 2021-04-02 MED ORDER — DEXAMETHASONE SODIUM PHOSPHATE 10 MG/ML IJ SOLN
INTRAMUSCULAR | Status: DC | PRN
  Administered 2021-04-02: 5 mg via INTRAVENOUS

## 2021-04-02 MED ORDER — METOPROLOL TARTRATE 5 MG/5ML IV SOLN
5.0000 mg | Freq: Four times a day (QID) | INTRAVENOUS | Status: DC
  Filled 2021-04-02: qty 5

## 2021-04-02 MED ORDER — FLUCONAZOLE IN SODIUM CHLORIDE 200-0.9 MG/100ML-% IV SOLN
200.0000 mg | INTRAVENOUS | Status: DC
  Administered 2021-04-02 – 2021-04-05 (×4): 200 mg via INTRAVENOUS
  Filled 2021-04-02 (×6): qty 100

## 2021-04-02 MED ORDER — MIDAZOLAM HCL 2 MG/2ML IJ SOLN
INTRAMUSCULAR | Status: AC
  Filled 2021-04-02: qty 2

## 2021-04-02 MED ORDER — PIPERACILLIN-TAZOBACTAM 3.375 G IVPB
3.3750 g | INTRAVENOUS | Status: AC
  Administered 2021-04-02: 18:00:00 3.375 g via INTRAVENOUS
  Filled 2021-04-02: qty 50

## 2021-04-02 MED ORDER — DOCUSATE SODIUM 50 MG/5ML PO LIQD: 100.0000 mg | Freq: Two times a day (BID) | ORAL | Status: DC

## 2021-04-02 MED ORDER — FENTANYL CITRATE PF 50 MCG/ML IJ SOSY
50.0000 ug | PREFILLED_SYRINGE | Freq: Once | INTRAMUSCULAR | Status: AC
  Administered 2021-04-02: 23:00:00 50 ug via INTRAVENOUS

## 2021-04-02 MED ORDER — FENTANYL 2500MCG IN NS 250ML (10MCG/ML) PREMIX INFUSION: 50.0000 ug/h | INTRAVENOUS | Status: DC

## 2021-04-02 SURGICAL SUPPLY — 47 items
APL PRP STRL LF DISP 70% ISPRP (MISCELLANEOUS) ×2
CANISTER SUCT 3000ML PPV (MISCELLANEOUS) ×4 IMPLANT
CANISTER WOUNDNEG PRESSURE 500 (CANNISTER) ×1 IMPLANT
CHLORAPREP W/TINT 26 (MISCELLANEOUS) ×3 IMPLANT
COVER SURGICAL LIGHT HANDLE (MISCELLANEOUS) ×3 IMPLANT
DRAIN CHANNEL 19F RND (DRAIN) ×2 IMPLANT
DRAPE LAPAROSCOPIC ABDOMINAL (DRAPES) ×3 IMPLANT
DRAPE WARM FLUID 44X44 (DRAPES) ×3 IMPLANT
DRSG OPSITE POSTOP 4X10 (GAUZE/BANDAGES/DRESSINGS) IMPLANT
DRSG OPSITE POSTOP 4X8 (GAUZE/BANDAGES/DRESSINGS) IMPLANT
DRSG VAC ATS LRG SENSATRAC (GAUZE/BANDAGES/DRESSINGS) ×1 IMPLANT
ELECT BLADE 6.5 EXT (BLADE) IMPLANT
ELECT CAUTERY BLADE 6.4 (BLADE) ×3 IMPLANT
ELECT REM PT RETURN 9FT ADLT (ELECTROSURGICAL) ×3
ELECTRODE REM PT RTRN 9FT ADLT (ELECTROSURGICAL) ×2 IMPLANT
EVACUATOR SILICONE 100CC (DRAIN) ×2 IMPLANT
GLOVE SURG ENC MOIS LTX SZ6.5 (GLOVE) ×4 IMPLANT
GLOVE SURG UNDER POLY LF SZ6 (GLOVE) ×4 IMPLANT
GOWN STRL REUS W/ TWL LRG LVL3 (GOWN DISPOSABLE) ×4 IMPLANT
GOWN STRL REUS W/TWL LRG LVL3 (GOWN DISPOSABLE) ×9
HANDLE SUCTION POOLE (INSTRUMENTS) ×2 IMPLANT
KIT BASIN OR (CUSTOM PROCEDURE TRAY) ×3 IMPLANT
KIT OSTOMY DRAINABLE 2.75 STR (WOUND CARE) ×1 IMPLANT
KIT TURNOVER KIT B (KITS) ×3 IMPLANT
LIGASURE IMPACT 36 18CM CVD LR (INSTRUMENTS) ×1 IMPLANT
NS IRRIG 1000ML POUR BTL (IV SOLUTION) ×9 IMPLANT
PACK GENERAL/GYN (CUSTOM PROCEDURE TRAY) ×3 IMPLANT
PAD ARMBOARD 7.5X6 YLW CONV (MISCELLANEOUS) ×3 IMPLANT
PENCIL SMOKE EVACUATOR (MISCELLANEOUS) ×3 IMPLANT
RELOAD PROXIMATE 75MM BLUE (ENDOMECHANICALS) ×9 IMPLANT
RELOAD STAPLE 75 3.8 BLU REG (ENDOMECHANICALS) IMPLANT
SPONGE T-LAP 18X18 ~~LOC~~+RFID (SPONGE) ×3 IMPLANT
STAPLER PROXIMATE 75MM BLUE (STAPLE) ×1 IMPLANT
STAPLER VISISTAT 35W (STAPLE) ×2 IMPLANT
SUCTION POOLE HANDLE (INSTRUMENTS) ×3
SUT ETHILON 2 0 FS 18 (SUTURE) ×2 IMPLANT
SUT NOVA 1 T20/GS 25DT (SUTURE) ×4 IMPLANT
SUT PDS AB 1 TP1 96 (SUTURE) ×2 IMPLANT
SUT PROLENE 2 0 CT2 30 (SUTURE) ×1 IMPLANT
SUT SILK 2 0 SH CR/8 (SUTURE) ×4 IMPLANT
SUT SILK 2 0 TIES 10X30 (SUTURE) ×3 IMPLANT
SUT SILK 3 0 SH CR/8 (SUTURE) ×3 IMPLANT
SUT SILK 3 0 TIES 10X30 (SUTURE) ×3 IMPLANT
SUT VIC AB 3-0 SH 18 (SUTURE) ×2 IMPLANT
TOWEL GREEN STERILE (TOWEL DISPOSABLE) ×3 IMPLANT
TRAY FOLEY MTR SLVR 16FR STAT (SET/KITS/TRAYS/PACK) ×1 IMPLANT
YANKAUER SUCT BULB TIP NO VENT (SUCTIONS) ×1 IMPLANT

## 2021-04-02 NOTE — Progress Notes (Signed)
Patients sister is wanting an update from a provider. PA notified.   Daymon Larsen, RN
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
## 2021-04-02 NOTE — Progress Notes (Addendum)
NAME:  Dorothy Harris, MRN:  [id number], DOB:  01/16/1961, LOS: 9 ADMISSION DATE:  03/24/2021, CONSULTATION DATE:  12/19 REFERRING MD:  Dr. Reather Laurence REASON FOR CONSULTATION: postanesthesia care  History of Present Illness:  This is a 60 year old female presented to [facility] ER 12/18 w/chest pain and SOB. 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 Started on esmolol, seen by CVTS; went to OR where she underwent repair and replacement of ascending aorta and replacement of aortic arch w/Hemashield graft. Returned to surgical ICU post op on 12/19. PCCM asked to assist w/ICU care.  PCCM signed off on 12/25.  Subsequently, the patient developed abdominal pain and returned to the operating theater on 12/27 after identification of abdominal free air on CT imaging.  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  Extended right hemicolectomy and ileostomy creation performed.  Case was discussed with Dr. Jamas Lav.  Spillage/soilage into the abdomen was reported.  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  Epigastric hernia   Significant Hospital Events: Including procedures, antibiotic start and stop dates in addition to other pertinent events   12/18 admitted w/ type A dissection. Underwent: Median Sternotomy Anastomosis of 8 mm Hemashield graft to the right axillary artery. Extracorporeal circulation via the right axillary artery and right atrial appendage Replacement of aortic arch under deep hypothermic circulatory arrest with bypass of the innominate, left common carotid and left subclavian arteries using a 30 x 10 x 8 x 8 x 10 mm Hemashield Platinum graft.  Supra-coronary replacement of the ascending aorta. Cross clamp  time of 169 minutes. Pt was noted to be coagulopathic intra-op  12/22 remains on cardene for hypertension.  12/24 finally off Cardene been complaining of abdominal pain.  General surgery evaluated her for a large abdominal hernia.  Will manage conservatively for now. 12/27 abdominal free air on CT.  Returned to the OR for extended right hemicolectomy and ileostomy creation.  Returned to 7Q73 on mechanical ventilatory support.  PCCM reconsulted for postanesthesia care.  Interim History / Subjective:  Transferred to 4L93 postoperatively after right hemicolectomy and ileostomy.  Objective   Blood pressure 120/69, pulse 83, temperature 98.5 F (36.9 C), temperature source Oral, resp. rate 20, height $RemoveBe'5\' 7"'ThboWIzXH$  (1.702 m), weight 95.8 kg, SpO2 97 %.    Vent Mode: PRVC FiO2 (%):  [40 %] 40 % Set Rate:  [16 bmp] 16 bmp Vt Set:  [500 mL] 500 mL PEEP:  [5 cmH20] 5 cmH20 Plateau Pressure:  [21 cmH20] 21 cmH20   Intake/Output Summary (Last 24 hours) at 04/02/2021 2130 Last data filed at 04/02/2021 2015 Gross per 24 hour  Intake 3050 ml  Output 850 ml  Net 2200 ml    Filed Weights   03/31/21 0500 04/01/21 0351 04/02/21 0500  Weight: 97.2 kg 96.6 kg 95.8 kg    Examination: General: Intubated, still under the effects of anesthesia. HENT:  normocephalic, atraumatic. Lungs: good air entry, no rales or rhonchi. Cardiovascular: Median sternotomy clean, dry, intact.  S1, S2 without murmur, rub or gallop. Abdomen: New ileostomy, pink.  Laparotomy incision with wound vac.  2 drains with slightly turbid, sanguineous fluid. Extremities: warm strong pulses, minimal edema.  Neuro: still under the effects of anesthesia.   GU:  Foley in situ with dark yellow   Ancillary tests personally reviewed:  ABG    Component Value Date/Time   PHART 7.379 04/02/2021 1909   PCO2ART 35.1 04/02/2021 1909   PO2ART 63 (L) 04/02/2021 1909   HCO3 20.7 04/02/2021 1909   TCO2 22 04/02/2021 1909   ACIDBASEDEF 4.0 (H)  04/02/2021 1909   O2SAT 91.0 04/02/2021 1909   Creatinine 2.59  WBC 20.6, hemoglobin 9.2, platelets 286 Assessment & Plan:  Principal Problem:   Status post surgery Active Problems:   Encounter for postanesthesia care   Peritonitis ([place])   Acute renal failure superimposed on stage 3a chronic kidney disease (HCC)   S/P ascending aortic replacement   Endotracheally intubated   Encounter for weaning from ventilator ([place])  Peritonitis secondary to incarcerated colon and perforated cecal volvulus and ischemic colon s/p extended R hemicolectomy and ileostomy Endotracheally intubated/postanesthesia care Critically ill due to expected post operative respiratory failure requiring mechanical ventilation.  With reported spillage/soilage, broaden antibiotic coverage.  Continue Zosyn.  Add fluconazole and vancomycin.  High risk for SIRS/sepsis. Maintain on mechanical ventilatory support overnight.  Propofol/fentanyl infusion for sedation on mechanical ventilatory support. Sedation vacation and spontaneous breathing trial in a.m. In anticipation of prolonged bowel rest (absolute NPO), start TPN.  Will need PICC line. Hold p.o. medications. Change aspirin to 300 mg PR daily. Change metoprolol to 5 mg IV every 6 hours scheduled.  Continue hydralazine 20 mg IV every 4 hours as needed for SBP > 130.    Acute on chronic stage III kidney disease Avoid nephrotoxic drugs Renal dosing medications Monitor urine output  Post-op anemia Macrocytic anemia Monitor hemoglobin Check B12 and folate  s/p AAA repair w/ replacement of ascending aorta and replacement of aortic arch w/ Hemashield graft.  H/o poorly controlled HTN with LVH   Best Practice (right click and "Reselect all SmartList Selections" daily)   Diet/type: TPN DVT prophylaxis: Subcutaneous heparin GI prophylaxis: Protonix Lines: Will need PICC line. Foley: Place Foley Code Status:  full code Last date of multidisciplinary goals of  care discussion:   Critical care time: 45 minutes.  The treatment and management of the patient's condition was required based on the threat of imminent deterioration. This time reflects time spent by the physician evaluating, providing care and managing the critically ill patient's care. The time was spent at the immediate bedside (or on the same floor/unit and dedicated to this patient's care). Time involved in separately billable procedures is NOT included int he critical care time indicated above. Family meeting and update time may be included above if and only if the patient is unable/incompetent to participate in clinical interview and/or decision making, and the discussion was necessary to determining treatment decisions.  Renee Pain, MD Board Certified by the ABIM, [place]

## 2021-04-02 NOTE — Progress Notes (Addendum)
Pharmacy Antibiotic Note  Dorothy Harris is a 60 y.o. female admitted on 03/24/2021 initially with chest pain and repair of acute aortic dissection on 12/19, now presenting with free air on CT and  intra-abdominal infection .  Pharmacy has been consulted for Zosyn dosing. First dose Zosyn given at 1750 PM. Patient now s/p exlap with extended colon resection, ileostomy, extensive lysis of adhesions, and repair of ventral hernia. CCM adding Vancomycin with recent multiple surgeries. Fluconazole per MD. Patient with CKD - SCr currently up at 2.59 (was 2.1 on admission). Last Vancomycin given for surgical prophylaxis on 12/19.   Plan: Zosyn 3.375g IV q8h (4 hour infusion). Vancomycin 2000mg  IV x1. No scheduled vancomycin due to renal function. Will trend SCr and monitor UOP to schedule further dosing.   Height: 5\' 7"  (170.2 cm) Weight: 95.8 kg (211 lb 3.2 oz) IBW/kg (Calculated) : 61.6  Temp (24hrs), Avg:98.3 F (36.8 C), Min:97.9 F (36.6 C), Max:98.7 F (37.1 C)  Recent Labs  Lab 03/28/21 0358 03/29/21 0214 03/30/21 0439 03/31/21 0218 03/31/21 1125 04/01/21 0400 04/02/21 0153  WBC 13.6* 13.2* 19.0* 19.9*  --  20.6*  --   CREATININE 2.48* 2.26* 2.40* 2.46*  --  2.64* 2.59*  LATICACIDVEN  --   --   --   --  0.8  --   --     Estimated Creatinine Clearance: 27.5 mL/min (A) (by C-G formula based on SCr of 2.59 mg/dL (H)).    No Known Allergies  Antimicrobials this admission: 12/27 Zosyn >>  12/27 Fluconazole >> 12/27 Vancomycin >>   Dose adjustments this admission:   Microbiology results: 12/27 MRSA PCR: negative  Thank you for allowing pharmacy to be a part of this patients care.  Brain Hilts 04/02/2021 9:37 PM

## 2021-04-02 NOTE — Progress Notes (Signed)
CARDIAC REHAB PHASE I   Offered to get pt up to chair with NT. Pt declining at this time, states she "doesn't feel like it". Stressed the importance of mobility post-op. Pt continues to decline. Will f/u and encourage as able.   Rufina Falco, RN BSN 04/02/2021 10:19 AM

## 2021-04-02 NOTE — Progress Notes (Addendum)
[address]            [place] [address]          [phone]   9 Days Post-Op Procedure(s) (LRB): 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 USING HEMASHIELD PLATINUM  [id number] mm (N/A) INTRAOPERATIVE TRANSESOPHAGEAL ECHOCARDIOGRAM APPLICATION OF CELL SAVER  Total Length of Stay:  LOS: 9 days   Subjective: Patient with abdominal pain upon waking up this am. She denies nausea or vomiting. She has not had a bowel movement  Objective: Vital signs in last 24 hours: Temp:  [97.8 F (36.6 C)-98.7 F (37.1 C)] 98.2 F (36.8 C) (12/27 0406) Pulse Rate:  [63-76] 73 (12/27 0406) Cardiac Rhythm: Normal sinus rhythm (12/26 2140) Resp:  [14-17] 17 (12/27 0406) BP: (118-144)/(69-81) 124/70 (12/27 0406) SpO2:  [94 %-97 %] 97 % (12/27 0406)  Filed Weights   03/30/21 0600 03/31/21 0500 04/01/21 0351  Weight: 97 kg 97.2 kg 96.6 kg      Intake/Output from previous day: 12/26 0701 - 12/27 0700 In: 50 [P.O.:50] Out: 0   Intake/Output this shift: Total I/O In: 50 [P.O.:50] Out: 0   Current Meds: Scheduled Meds:  amLODipine  10 mg Oral Daily   aspirin EC  325 mg Oral Daily   bisacodyl  10 mg Oral Daily   Or   bisacodyl  10 mg Rectal Daily   chlorhexidine  15 mL Mouth Rinse BID   Chlorhexidine Gluconate Cloth  6 each Topical Daily   docusate sodium  200 mg Oral Daily   feeding supplement  237 mL Oral BID BM   guaiFENesin  600 mg Oral BID   heparin  5,000 Units Subcutaneous Q8H   hydrALAZINE  100 mg Oral Q8H   mouth rinse  15 mL Mouth Rinse q12n4p   metoprolol tartrate  50 mg Oral BID   pantoprazole  40 mg Oral Daily   sodium chloride flush  10-40 mL Intracatheter Q12H     PRN Meds:.fentaNYL (SUBLIMAZE) injection, hydrALAZINE, ondansetron (ZOFRAN) IV, oxyCODONE, sodium chloride flush, traMADol  General appearance: alert, cooperative, and no distress Heart: RRR, no murmur Lungs: Diminished bibasilar breath sounds   Abdomen: Soft, obese, large ventral hernia, grimaces when I palpate Extremities: SCDs in place Wound: Sternal and right axillary wounds are clean and dry. No sign of infection.  Lab Results: CBC: Recent Labs    03/31/21 0218 04/01/21 0400  WBC 19.9* 20.6*  HGB 9.2* 9.2*  HCT 30.2* 28.5*  PLT 231 286    BMET:  Recent Labs    04/01/21 0400 04/02/21 0153  NA 141 140  K 3.9 3.9  CL 106 105  CO2 22 23  GLUCOSE 122* 114*  BUN 64* 68*  CREATININE 2.64* 2.59*  CALCIUM 8.9 8.8*     CMET: Lab Results  Component Value Date   WBC 20.6 (H) 04/01/2021   HGB 9.2 (L) 04/01/2021   HCT 28.5 (L) 04/01/2021   PLT 286 04/01/2021   GLUCOSE 114 (H) 04/02/2021   CHOL 220 (H) 08/21/2017   TRIG 227 (H) 08/21/2017   HDL 50 (L) 08/21/2017   LDLCALC 133 (H) 08/21/2017   ALT 10 08/21/2017   AST 15 08/21/2017   NA 140 04/02/2021   K 3.9 04/02/2021   CL 105 04/02/2021   CREATININE 2.59 (H) 04/02/2021   BUN 68 (H) 04/02/2021   CO2 23 04/02/2021  TSH 0.949 12/23/2015   INR 1.4 (H) 03/25/2021   HGBA1C 5.9 (H) 03/28/2021    PT/INR:  No results for input(s): LABPROT, INR in the last 72 hours.  Radiology: No results found.   Assessment/Plan: S/P Procedure(s) (LRB): 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 USING HEMASHIELD PLATINUM  [id number] mm (N/A) INTRAOPERATIVE TRANSESOPHAGEAL ECHOCARDIOGRAM APPLICATION OF CELL SAVER  CV-SR with HR in the 70's and good control of blood pressure. On Amlodipine 10 mg daily. Lopressor 50 mg bid, and Hydralazine 100 mg tid. Pulmonary-on oxygen via [place] this am. Check PA/LAT CXR. Encourage incentive spirometer and flutter valve GI-per general surgery, chronically incarcerated ventral hernia without signs of bowel obstruction. Likely ileus. Diet per general surgery. 4. Expected post op blood loss anemia-Last H and H this am stable at 9.2 and 28.5 5. CKD (stage IV)-creatinine this am decreased to 2.59. Creatinine upon admission  1.99. Chronic Kidney Disease   Stage I     GFR >90  Stage II    GFR 60-89  Stage IIIA GFR 45-59  Stage IIIB GFR 30-44  Stage IV   GFR 15-29  Stage V    GFR  <15  Lab Results  Component Value Date   CREATININE 2.59 (H) 04/02/2021   Estimated Creatinine Clearance: 27.6 mL/min (A) (by C-G formula based on SCr of 2.59 mg/dL (H)).  6. Deconditioned-PT to assist with ambulation. PT did not see patient yesterday;will order again. Will also order OT. Patient will need CIR/SNF at discharge  Nani Skillern [facility] 04/02/2021 6:57 AM    Chart reviewed, patient examined, agree with above.

## 2021-04-02 NOTE — Progress Notes (Signed)
Attempted to reach patient's sister via phone to provide update. No answer, left VM.   Jesusita Oka, MD General and [place] [facility]

## 2021-04-02 NOTE — Progress Notes (Signed)
Patient noted to have a new onset stutter speech. Rapid present. No focal deficits noted. MD paged.  Daymon Larsen, RN

## 2021-04-02 NOTE — Plan of Care (Signed)

## 2021-04-02 NOTE — Progress Notes (Signed)
No changes to previous assessment.

## 2021-04-02 NOTE — Anesthesia Procedure Notes (Signed)
Arterial Line Insertion Start/End12/27/2022 5:10 PM, 04/02/2021 5:20 PM Performed by: CRNA  Patient location: Pre-op. Preanesthetic checklist: patient identified, IV checked, site marked, risks and benefits discussed, surgical consent, monitors and equipment checked, pre-op evaluation, timeout performed and anesthesia consent Lidocaine 1% used for infiltration Left, radial was placed Catheter size: 20 G Hand hygiene performed  and maximum sterile barriers used   Attempts: 1 Procedure performed without using ultrasound guided technique. Following insertion, Biopatch and dressing applied. Post procedure assessment: normal  Patient tolerated the procedure well with no immediate complications.

## 2021-04-02 NOTE — Anesthesia Preprocedure Evaluation (Addendum)
Anesthesia Evaluation  Patient identified by MRN, date of birth, ID band Patient awake    Reviewed: Allergy & Precautions, NPO status , Patient's Chart, lab work & pertinent test results  Airway Mallampati: III  TM Distance: >3 FB Neck ROM: Full    Dental  (+) Loose, Missing, Chipped, Dental Advisory Given,    Pulmonary pneumonia, Current Smoker and Patient abstained from smoking.,    Pulmonary exam normal        Cardiovascular hypertension, Pt. on medications + CAD and + Past MI   Rhythm:Regular Rate:Normal + Systolic murmurs Intraoperative Echo 12/182/2022 _ Left Ventricle: The left ventricle is unchanged from pre-bypass.  _ Right Ventricle: The right ventricle appears unchanged from pre-bypass.  _ Aorta: There is no dissection present in the aortic root. A graft was placed in the ascending aorta for repair.  _ Left Atrial Appendage: The left atrial appendage appears unchanged from pre-bypass.  _ Aortic Valve: There is trace regurgitation. The jet is directed posteriorly. On short axis view there is a small coaptation defect between the left coronary cusp and the non coronary cusp.  _ Mitral Valve: The mitral valve appears unchanged from pre-bypass.  _ Tricuspid Valve: The tricuspid valve appears unchanged from pre-bypass.  _ Pulmonic Valve: The pulmonic valve appears unchanged from pre-bypass.  _ Interatrial Septum: The interatrial septum appears unchanged from pre-bypass.  _ Pericardium: The pericardium appears unchanged from pre-bypass.   PRE-OP FINDINGS  Left Ventricle: The left ventricle has hyperdynamic systolic function, with an ejection fraction of >65%. The cavity size was normal. There is severely increased left ventricular wall thickness. There is severe concentric left ventricular hypertrophy.   Right Ventricle: The right ventricle has normal systolic function. The  cavity was normal. There is no increase in right  ventricular wall  thickness.   Left Atrium: Left atrial size was normal in size. No left atrial/left atrial appendage thrombus was detected.   Right Atrium: Right atrial size was normal in size.   Interatrial Septum: No atrial level shunt detected by color flow Doppler.   Pericardium: A small pericardial effusion is present.   Mitral Valve: The mitral valve is normal in structure. Mitral valve regurgitation is mild by color flow Doppler.   Tricuspid Valve: The tricuspid valve was normal in structure. Tricuspid valve regurgitation was not visualized by color flow Doppler.   Aortic Valve: The aortic valve is normal in structure. Aortic valve regurgitation was not visualized by color flow Doppler. There is no stenosis of the aortic valve.   Pulmonic Valve: The pulmonic valve was normal in structure. Pulmonic valve regurgitation is not visualized by color flow Doppler.   Aorta: There is mild dilatation of the aortic root and of the ascending aorta. There is evidence of a dissection in the aortic root and ascending aorta. The dissection extends proximally to the sinus of valsalva. The descending aorta is dilated.    Neuro/Psych    GI/Hepatic (+)     substance abuse  ,   Endo/Other    Renal/GU CRFRenal disease     Musculoskeletal   Abdominal (+) + obese,   Peds  Hematology   Anesthesia Other Findings aortic dissection  Reproductive/Obstetrics                           Anesthesia Physical  Anesthesia Plan  ASA: 4  Anesthesia Plan: General   Post-op Pain Management: Ofirmev IV (intra-op) and Dilaudid IV   Induction:  Intravenous  PONV Risk Score and Plan: 4 or greater and Ondansetron, Dexamethasone, Midazolam and Treatment may vary due to age or medical condition  Airway Management Planned: Oral ETT  Additional Equipment: Arterial line  Intra-op Plan:   Post-operative Plan: Possible Post-op intubation/ventilation  Informed Consent: I  have reviewed the patients History and Physical, chart, labs and discussed the procedure including the risks, benefits and alternatives for the proposed anesthesia with the patient or authorized representative who has indicated his/her understanding and acceptance.     Dental advisory given  Plan Discussed with: CRNA  Anesthesia Plan Comments: ( 2 x PIV vs. CVL)     Anesthesia Quick Evaluation

## 2021-04-02 NOTE — Progress Notes (Signed)
CT imaging reviewed. Free air. Plan for exlap, possible bowel resection, possible ostomy, any other necessary and indicated procedures. Informed consent was obtained after detailed explanation of risks, including bleeding, infection, abscess, staple line leak, possible ostomy, possible fistula, hernia recurrence. All questions answered to the patient's satisfaction. I offered to contact a family member to discuss consent as well and patient requested I call her sister, Colletta [place]. I discussed the same with [facility]. All questions answered.   Jesusita Oka, MD General and [facility]

## 2021-04-02 NOTE — Anesthesia Procedure Notes (Signed)
Procedure Name: Intubation Date/Time: 04/02/2021 5:49 PM Performed by: Lorie Phenix, CRNA Pre-anesthesia Checklist: Patient identified, Emergency Drugs available, Suction available and Patient being monitored Patient Re-evaluated:Patient Re-evaluated prior to induction Oxygen Delivery Method: Circle system utilized Preoxygenation: Pre-oxygenation with 100% oxygen Induction Type: IV induction Ventilation: Mask ventilation without difficulty Laryngoscope Size: Mac and 3 Grade View: Grade I Tube type: Oral Number of attempts: 1 Airway Equipment and Method: Stylet Placement Confirmation: ETT inserted through vocal cords under direct vision, positive ETCO2 and breath sounds checked- equal and bilateral Secured at: 22 cm Tube secured with: Tape Dental Injury: Teeth and Oropharynx as per pre-operative assessment

## 2021-04-02 NOTE — Progress Notes (Signed)
Rapid Response RN Progress Note  New onset stuttering. Pt family member arrived at 30 and at that time she noted pt to be stuttering. Primary RN did not notice the pt stuttering earlier today, but at that time pt was responding to RN with one word responses. Per pt's family member, pt was not stuttering during her visit yesterday.   Full NIH completed, 0. See flowsheet for detailed documentation.   VS: BP 129/78, HR 72, RR 21, SpO2 93% on 2LNC CBG: 122  Arrival Time: 1345 End Time: [place] Rapid Response RN

## 2021-04-02 NOTE — Progress Notes (Signed)
CARDIAC REHAB PHASE I   Pt not appropriate for cardiac rehab at this time. Will continue to follow as appropriate.   Rufina Falco, RN BSN 04/02/2021 2:04 PM

## 2021-04-02 NOTE — Op Note (Signed)
Operative Note   Date: 04/02/2021  Procedure: exploratory laparotomy, adhesiolysis x37min, extended right hemicolectomy, primary incisional hernia repair, ileostomy creation, abdominal washout, drain placement x2, incisional wound vac placement 30x8x4cm   Pre-op diagnosis: pneumoperitoneum, incarcerated incisional hernia Post-op diagnosis: strangulated incisional hernia (15x7cm) with perforated proximal transverse colon; volvulized, strangulated, and perforated cecum; ischemic mid-transverse colon  Indication and clinical history: The patient is a 60 y.o. year old female with incarcerated incisional hernia and  free air on CT.      Surgeon: Jesusita Oka, MD Assistant: Georgette Dover, MD  Anesthesiologist: Lissa Hoard, MD Anesthesia: General  Findings:  Specimen: terminal ileum, appendix, cecum, ascending to distal transverse colon EBL: 100cc Drains/Implants: 41F JP drian x2: #1 exiting L abdomen, terminating in the pelvis; #2 exiting R abdomen, terminating along R paracolic gutter  Disposition: ICU - intubated and hemodynamically stable.  Description of procedure: The patient was positioned supine on the operating room table. General anesthetic induction and intubation were uneventful. Foley catheter insertion was performed and was atraumatic. Time-out was performed verifying correct patient, procedure, signature of informed consent, and administration of pre-operative antibiotics. The abdomen was prepped and draped in the usual sterile fashion.  A midline incision was made and deepened until the hernia sac was reached. The superior aspect of the uppermost hernia was entered to reach the peritoneal cavity. Adhesiolysis was performed circumferentially to free the upper hernia which contained viable hepatic flexure of the colon as well as the lower hernia, which contained strangulated and perforated proximal transverse colon, and small bowel that was densely adherent to the anterior abdominal  wall. A segmental resection of the non-viable colon was performed. The small bowel was inspected from ligament of Treitz to ileocecal valve. The colon was inspected and right colon appeared pale. Upon further inspection, a free perforation was noted in the right upper quadrant and ultimately, it was determined that the right colon had volvulized and the area of perforation in the right upper quadrant was the cecum. This was reduced and the terminal ileum and right colon transected and excised using a Ligasure device. The remaining colon was inspected and pneumatosis, ischemia, and impending perforation of the mid-transverse colon was noted. This portion of colon was resected back to healthy tissue. The proximal end of the remaining colon was tagged with two 2-0 prolene sutures. The remainder of the colon was inspected and appeared healthy. The omentum was noted to have several defects in it, all of which were closed in an attempt to prevent an internal hernia. The abdomen was copiously irrigated until the fluid returned clear. A site for the ostomy was created in the right abdomen and the terminal ileum brought through this site. A JP drain was placed in the left abdomen with the end terminating in the pelvis. A second JP drain was placed in the right abdomen with the end terminating in the right paracolic gutter. The fascia was closed with #1 Novofil suture in a interrupted, figure of eight fashion. An incisional wound vac was applied as sterile dressing. The ostomy was then matured and an ostomy appliance placed over it.   All sponge and instrument counts were correct at the conclusion of the procedure. The patient transported to the ICU intubated and in stable condition. There were no complications.   Upon entering the abdomen (organ space), I encountered feculent peritonitis.  CASE DATA:  Type of patient?: DOW CASE (Surgical Hospitalist [facility])  Status of Case? EMERGENT Add On  Infection  Present At  Time Of Surgery (PATOS)?  FECULENT PERITONITIS    Jesusita Oka, MD General and [place] [facility]

## 2021-04-02 NOTE — Transfer of Care (Signed)
Immediate Anesthesia Transfer of Care Note  Patient: Dorothy Harris  Procedure(s) Performed: EXPLORATORY LAPAROTOMY (Abdomen) EXTENDED COLON RESECTION, CREATION OF ILEOSTOMY, AND EXTENSIVE LOA, AND REPAIR VNETRAL HERNIA (Abdomen)  Patient Location: ICU  Anesthesia Type:General  Level of Consciousness: sedated  Airway & Oxygen Therapy: Patient remains intubated per anesthesia plan and Patient placed on Ventilator (see vital sign flow sheet for setting)  Post-op Assessment: Report given to RN and Post -op Vital signs reviewed and stable  Post vital signs: Reviewed and stable  Last Vitals:  Vitals Value Taken Time  BP 149/136 04/02/21 2051  Temp    Pulse 84 04/02/21 2100  Resp 16 04/02/21 2100  SpO2 98 % 04/02/21 2100  Vitals shown include unvalidated device data.  Last Pain:  Vitals:   04/02/21 1700  TempSrc:   PainSc: 0-No pain      Patients Stated Pain Goal: 0 (04/10/02 5913)  Complications: No notable events documented.

## 2021-04-03 ENCOUNTER — Encounter (HOSPITAL_COMMUNITY): Payer: Self-pay | Admitting: Surgery

## 2021-04-03 ENCOUNTER — Inpatient Hospital Stay (HOSPITAL_COMMUNITY): Payer: PRIVATE HEALTH INSURANCE

## 2021-04-03 DIAGNOSIS — E440 Moderate protein-calorie malnutrition: Secondary | ICD-10-CM | POA: Insufficient documentation

## 2021-04-03 LAB — VITAMIN B12: Vitamin B-12: 771 pg/mL (ref 180–914)

## 2021-04-03 LAB — CBC
HCT: 28 % — ABNORMAL LOW (ref 36.0–46.0)
Hemoglobin: 9.2 g/dL — ABNORMAL LOW (ref 12.0–15.0)
MCH: 32.3 pg (ref 26.0–34.0)
MCHC: 32.9 g/dL (ref 30.0–36.0)
MCV: 98.2 fL (ref 80.0–100.0)
Platelets: 322 10*3/uL (ref 150–400)
RBC: 2.85 MIL/uL — ABNORMAL LOW (ref 3.87–5.11)
RDW: 18.4 % — ABNORMAL HIGH (ref 11.5–15.5)
WBC: 18.7 10*3/uL — ABNORMAL HIGH (ref 4.0–10.5)
nRBC: 0.3 % — ABNORMAL HIGH (ref 0.0–0.2)

## 2021-04-03 LAB — COMPREHENSIVE METABOLIC PANEL
ALT: 42 U/L (ref 0–44)
AST: 73 U/L — ABNORMAL HIGH (ref 15–41)
Albumin: 1.8 g/dL — ABNORMAL LOW (ref 3.5–5.0)
Alkaline Phosphatase: 219 U/L — ABNORMAL HIGH (ref 38–126)
Anion gap: 11 (ref 5–15)
BUN: 66 mg/dL — ABNORMAL HIGH (ref 6–20)
CO2: 18 mmol/L — ABNORMAL LOW (ref 22–32)
Calcium: 7.7 mg/dL — ABNORMAL LOW (ref 8.9–10.3)
Chloride: 108 mmol/L (ref 98–111)
Creatinine, Ser: 2.49 mg/dL — ABNORMAL HIGH (ref 0.44–1.00)
GFR, Estimated: 22 mL/min — ABNORMAL LOW (ref >60)
Glucose, Bld: 129 mg/dL — ABNORMAL HIGH (ref 70–99)
Potassium: 3.9 mmol/L (ref 3.5–5.1)
Sodium: 137 mmol/L (ref 135–145)
Total Bilirubin: 3.9 mg/dL — ABNORMAL HIGH (ref 0.3–1.2)
Total Protein: 4.9 g/dL — ABNORMAL LOW (ref 6.5–8.1)

## 2021-04-03 LAB — FOLATE: Folate: 9.7 ng/mL (ref >5.9)

## 2021-04-03 LAB — TRIGLYCERIDES: Triglycerides: 264 mg/dL — ABNORMAL HIGH (ref <150)

## 2021-04-03 LAB — PHOSPHORUS: Phosphorus: 4.3 mg/dL (ref 2.5–4.6)

## 2021-04-03 LAB — MAGNESIUM: Magnesium: 2.5 mg/dL — ABNORMAL HIGH (ref 1.7–2.4)

## 2021-04-03 LAB — GLUCOSE, CAPILLARY
Glucose-Capillary: 180 mg/dL — ABNORMAL HIGH (ref 70–99)
Glucose-Capillary: 185 mg/dL — ABNORMAL HIGH (ref 70–99)

## 2021-04-03 MED ORDER — VANCOMYCIN HCL 1250 MG/250ML IV SOLN
1250.0000 mg | INTRAVENOUS | Status: DC
Start: 2021-04-04 — End: 2021-04-08
  Administered 2021-04-04 – 2021-04-06 (×2): 1250 mg via INTRAVENOUS
  Filled 2021-04-03 (×2): qty 250

## 2021-04-03 MED ORDER — INSULIN ASPART 100 UNIT/ML IJ SOLN
0.0000 [IU] | Freq: Four times a day (QID) | INTRAMUSCULAR | Status: DC
  Administered 2021-04-03 – 2021-04-04 (×3): 2 [IU] via SUBCUTANEOUS

## 2021-04-03 MED ORDER — LACTATED RINGERS IV SOLN: INTRAVENOUS | Status: DC

## 2021-04-03 MED ORDER — ALBUTEROL SULFATE (2.5 MG/3ML) 0.083% IN NEBU
2.5000 mg | INHALATION_SOLUTION | Freq: Four times a day (QID) | RESPIRATORY_TRACT | Status: DC | PRN
  Administered 2021-04-06 (×2): 2.5 mg via RESPIRATORY_TRACT
  Filled 2021-04-03 (×2): qty 3

## 2021-04-03 MED ORDER — LORAZEPAM 2 MG/ML IJ SOLN: 2.0000 mg | Freq: Once | INTRAMUSCULAR | Status: DC | PRN

## 2021-04-03 MED ORDER — LACTATED RINGERS IV BOLUS
1000.0000 mL | Freq: Once | INTRAVENOUS | Status: AC
  Administered 2021-04-03: 03:00:00 1000 mL via INTRAVENOUS

## 2021-04-03 MED ORDER — SODIUM CHLORIDE 0.9% FLUSH
10.0000 mL | Freq: Two times a day (BID) | INTRAVENOUS | Status: DC
Start: 2021-04-03 — End: 2021-04-15
  Administered 2021-04-03 – 2021-04-15 (×18): 10 mL

## 2021-04-03 MED ORDER — TRAVASOL 10 % IV SOLN
INTRAVENOUS | Status: AC
  Filled 2021-04-03: qty 585.6

## 2021-04-03 MED ORDER — SODIUM CHLORIDE 0.9% FLUSH
10.0000 mL | INTRAVENOUS | Status: DC | PRN
  Administered 2021-04-14: 10 mL

## 2021-04-03 MED FILL — Heparin Sodium (Porcine) Inj 1000 Unit/ML: INTRAMUSCULAR | Qty: 10 | Status: AC

## 2021-04-03 MED FILL — Heparin Sodium (Porcine) Inj 1000 Unit/ML: Qty: 1000 | Status: AC

## 2021-04-03 MED FILL — Heparin Sodium (Porcine) Inj 1000 Unit/ML: INTRAMUSCULAR | Qty: 5000 | Status: AC

## 2021-04-03 MED FILL — Mannitol IV Soln 20%: INTRAVENOUS | Qty: 500 | Status: AC

## 2021-04-03 MED FILL — Electrolyte-R (PH 7.4) Solution: INTRAVENOUS | Qty: 5000 | Status: AC

## 2021-04-03 MED FILL — Sodium Bicarbonate IV Soln 8.4%: INTRAVENOUS | Qty: 150 | Status: AC

## 2021-04-03 MED FILL — Potassium Chloride Inj 2 mEq/ML: INTRAVENOUS | Qty: 40 | Status: AC

## 2021-04-03 MED FILL — Lidocaine HCl Local Preservative Free (PF) Inj 2%: INTRAMUSCULAR | Qty: 15 | Status: AC

## 2021-04-03 MED FILL — Albumin, Human Inj 5%: INTRAVENOUS | Qty: 250 | Status: AC

## 2021-04-03 MED FILL — Sodium Chloride IV Soln 0.9%: INTRAVENOUS | Qty: 3000 | Status: AC

## 2021-04-03 MED FILL — Calcium Chloride Inj 10%: INTRAVENOUS | Qty: 10 | Status: AC

## 2021-04-03 MED FILL — Lidocaine HCl Local Soln Prefilled Syringe 100 MG/5ML (2%): INTRAMUSCULAR | Qty: 5 | Status: AC

## 2021-04-03 NOTE — Progress Notes (Signed)
1 Day Post-Op  Subjective: CC: On ventilator and sedated but arouses easily to voice and follows commands. She denies pain at rest but obvious expected tenderness with palpation of abdomen  Objective: Vital signs in last 24 hours: Temp:  [97.9 F (36.6 C)-99.5 F (37.5 C)] 99.5 F (37.5 C) (12/28 0358) Pulse Rate:  [64-85] 79 (12/28 0717) Resp:  [16-21] 17 (12/28 0717) BP: (95-149)/(53-136) 97/72 (12/28 0717) SpO2:  [92 %-100 %] 96 % (12/28 0738) Arterial Line BP: (79-138)/(48-67) 129/56 (12/28 0700) FiO2 (%):  [40 %] 40 % (12/28 0738) Weight:  [97.1 kg] 97.1 kg (12/28 0500) Last BM Date:  (PTA)  Intake/Output from previous day: 12/27 0701 - 12/28 0700 In: 5729.9 [P.O.:1000; I.V.:2229; IV Piggyback:2500.9] Out: 1440 [Urine:1040; Drains:200; Blood:200] Intake/Output this shift: No intake/output data recorded.  PE: Gen:  on ventilator, sedated, f/c, NAD Card:  Reg rate and rhythm  Pulm: normal work of breathing on ventilator, coarse breath sound bilaterally Abd: soft, diffuse mild expected TTP. Asymmetric edema along left lower lateral incision border that does not feel indurated or fluid filled Midline incision with vac in place - good suction and output serosanguinous Ileostomy with stoma pink and healthy, thin serous fluid in bag, no gas Drain RLQ - SS Drain LLQ - SS NGT with scant bilious output GU: foley in place with clear yellow urine Skin: no rashes noted, warm and dry  Lab Results:  Recent Labs    04/01/21 0400 04/02/21 1909  WBC 20.6*  --   HGB 9.2* 9.2*  HCT 28.5* 27.0*  PLT 286  --     BMET Recent Labs    04/02/21 0153 04/02/21 1909 04/03/21 0113  NA 140 138 137  K 3.9 3.9 3.9  CL 105  --  108  CO2 23  --  18*  GLUCOSE 114*  --  129*  BUN 68*  --  66*  CREATININE 2.59*  --  2.49*  CALCIUM 8.8*  --  7.7*    PT/INR No results for input(s): LABPROT, INR in the last 72 hours. CMP     Component Value Date/Time   NA 137 04/03/2021 0113    K 3.9 04/03/2021 0113   CL 108 04/03/2021 0113   CO2 18 (L) 04/03/2021 0113   GLUCOSE 129 (H) 04/03/2021 0113   BUN 66 (H) 04/03/2021 0113   CREATININE 2.49 (H) 04/03/2021 0113   CREATININE 1.69 (H) 08/21/2017 0935   CALCIUM 7.7 (L) 04/03/2021 0113   PROT 4.9 (L) 04/03/2021 0113   ALBUMIN 1.8 (L) 04/03/2021 0113   AST 73 (H) 04/03/2021 0113   ALT 42 04/03/2021 0113   ALKPHOS 219 (H) 04/03/2021 0113   BILITOT 3.9 (H) 04/03/2021 0113   GFRNONAA 22 (L) 04/03/2021 0113   GFRNONAA 33 (L) 08/21/2017 0935   GFRAA 53 (L) 10/07/2017 0720   GFRAA 39 (L) 08/21/2017 0935   Lipase  No results found for: LIPASE  Studies/Results: CT ABDOMEN PELVIS WO CONTRAST  Result Date: 04/02/2021 CLINICAL DATA:  Hernia, complicated. EXAM: CT ABDOMEN AND PELVIS WITHOUT CONTRAST TECHNIQUE: Multidetector CT imaging of the abdomen and pelvis was performed following the standard protocol without IV contrast. COMPARISON:  CT abdomen and pelvis dated 03/24/2021. FINDINGS: Lower chest: New bibasilar consolidations, likely atelectasis. Surgical changes suggestive of ascending aorta replacement. Aneurysmal dilatation of the descending thoracic aorta, similar in caliber and configuration to earlier CT of 03/24/2021, further evaluation limited by the lack of intravascular contrast on today's study. Hepatobiliary: No acute-appearing  abnormality within the liver. Stable appearance of the RIGHT hepatic cyst which measures approximally 5 cm greatest dimension. Vicarious excretion of contrast noted within the otherwise normal-appearing gallbladder. No bile duct dilatation is seen. Pancreas: Unremarkable. No pancreatic ductal dilatation or surrounding inflammatory changes. Spleen: Normal in size without focal abnormality. Adrenals/Urinary Tract: Bilateral adrenal fullness, but otherwise unremarkable. Kidneys appear normal without stone or hydronephrosis. Bladder appears normal. Stomach/Bowel: Midline abdominal wall hernia,  multicompartmental, containing separate segments of the transverse colon. There is now a dilatation of the ascending colon and cecum (proximal to the hernia site) with associated air-fluid levels, indicating mechanical obstruction at the hernia site. The descending colon is relatively decompressed indicating complete or high-grade partial obstruction. No associated small bowel dilatation at present. Vascular/Lymphatic: Abdominal aortic aneurysm, stable in the short-term interval, 4.6 cm in maximum diameter (previously 3.9 cm on CT of 06/2017). Further characterization limited by the lack of intravascular contrast. Reproductive: Uterus and bilateral adnexa are unremarkable. Other: Small amount of free fluid in the pelvis. Large amount of free intraperitoneal air within the upper abdomen. Additional extraluminal air within the hernia sac. Findings are compatible with a bowel perforation, likely involving the transverse colon at or immediately adjacent to the hernia sac. Musculoskeletal: No acute-appearing osseous abnormality. Degenerative spondylosis of the thoracolumbar spine, mild in degree. Large amount of apparent soft tissue edema versus soft tissue hematoma is now seen along the LEFT lateral chest wall, incompletely imaged. IMPRESSION: 1. Large amount of free intraperitoneal air within the upper abdomen, with additional extraluminal air within the hernia sac. Findings are compatible with a bowel perforation, likely involving the transverse colon at or immediately adjacent to the hernia sac. 2. Midline abdominal wall hernia, multicompartmental, containing separate segments of the transverse colon. There is now a dilatation of the ascending colon and cecum (proximal to the hernia site) with associated air-fluid levels, indicating mechanical obstruction at the hernia site. The descending colon is relatively decompressed indicating complete or high-grade partial obstruction. 3. Abdominal aortic aneurysm, stable in  the short-term interval, 4.6 cm in maximum diameter (previously 3.9 cm on CT of 06/2017). Further characterization limited by the lack of intravascular contrast on today's study. 4. New bibasilar consolidations, likely atelectasis. 5. Large amount of apparent soft tissue edema versus soft tissue hematoma is now seen along the LEFT lateral chest wall, incompletely imaged. Clinical correlation recommended. Critical Value/emergent results were called by telephone at the time of interpretation on 04/02/2021 at 4:40 pm to provider [facility] , who verbally acknowledged these results. Electronically Signed   By: Franki Cabot M.D.   On: 04/02/2021 16:41   DG Chest 2 View  Result Date: 04/02/2021 CLINICAL DATA:  Effusions.  Distended abdomen. EXAM: CHEST - 2 VIEW COMPARISON:  Two-view chest x-ray 12/24/2 FINDINGS: Heart is enlarged. Right IJ sheath was removed. Left pleural effusion remains. Mild bibasilar atelectasis is present. No significant edema is present. No significant airspace consolidation IMPRESSION: Persistent left pleural effusion and bibasilar atelectasis. Electronically Signed   By: San Morelle M.D.   On: 04/02/2021 09:48   DG Abd 1 View  Result Date: 04/02/2021 CLINICAL DATA:  Pleural effusion, distended abdomen. EXAM: ABDOMEN - 1 VIEW COMPARISON:  Radiographs 03/31/2021, 03/30/2021 and 06/03/2017. CT 03/24/2021. FINDINGS: 0936 hours. Two supine views are submitted. Progressive increased gaseous distention of the right colon which could relate to colonic obstruction within the ventral hernia containing transverse colon on recent CT. There is gas throughout nondistended small bowel and distal colon. No supine evidence  of free intraperitoneal air. Mild bibasilar atelectasis and a probable small right pleural effusion are noted. There are degenerative changes within the spine. IMPRESSION: Increased gaseous distention of the right colon which could be due to postoperative ileus or colonic  obstruction within the known ventral hernia. Electronically Signed   By: Richardean Sale M.D.   On: 04/02/2021 09:53   DG CHEST PORT 1 VIEW  Result Date: 04/02/2021 CLINICAL DATA:  NG tube placement and intubation EXAM: PORTABLE CHEST 1 VIEW COMPARISON:  04/02/2021 FINDINGS: Postoperative changes in the mediastinum. Interval placement of an endotracheal tube with tip measuring 4.5 cm above the carina. An enteric tube is present. Tip is off the field of view but below the left hemidiaphragm. Shallow inspiration. Lungs are clear. Mild cardiac enlargement. Dilated aortic arch and descending aorta. Surgical clips in the right axilla. IMPRESSION: Appliances appear in satisfactory location. Cardiac enlargement. Dilated aorta. Lungs are clear. Electronically Signed   By: Lucienne Capers M.D.   On: 04/02/2021 22:01   [place] EKG SITE RITE  Result Date: 04/02/2021 If Site Rite image not attached, placement could not be confirmed due to current cardiac rhythm.   Anti-infectives: Anti-infectives (From admission, onward)    Start     Dose/Rate Route Frequency Ordered Stop   04/03/21 0600  piperacillin-tazobactam (ZOSYN) IVPB 3.375 g        3.375 g 12.5 mL/hr over 240 Minutes Intravenous On call to O.R. 04/02/21 1603 04/03/21 0916   04/03/21 0200  piperacillin-tazobactam (ZOSYN) IVPB 3.375 g        3.375 g 12.5 mL/hr over 240 Minutes Intravenous Every 8 hours 04/02/21 2143     04/02/21 2245  fluconazole (DIFLUCAN) IVPB 200 mg        200 mg 100 mL/hr over 60 Minutes Intravenous Every 24 hours 04/02/21 2147     04/02/21 2245  vancomycin (VANCOREADY) IVPB 2000 mg/400 mL        2,000 mg 200 mL/hr over 120 Minutes Intravenous  Once 04/02/21 2158 04/03/21 0142   04/02/21 2158  vancomycin variable dose per unstable renal function (pharmacist dosing)         Does not apply See admin instructions 04/02/21 2159     03/25/21 1200  vancomycin (VANCOCIN) IVPB 1000 mg/200 mL premix        1,000 mg 200 mL/hr over 60  Minutes Intravenous  Once 03/25/21 0512 03/25/21 1206   03/25/21 0600  ceFAZolin (ANCEF) IVPB 2g/100 mL premix        2 g 200 mL/hr over 30 Minutes Intravenous Every 8 hours 03/25/21 0512 03/26/21 2217   03/24/21 1800  ceFAZolin (ANCEF) IVPB 2g/100 mL premix        2 g 200 mL/hr over 30 Minutes Intravenous To Surgery 03/24/21 1738 03/25/21 0308   03/24/21 1800  ceFAZolin (ANCEF) IVPB 2g/100 mL premix  Status:  Discontinued        2 g 200 mL/hr over 30 Minutes Intravenous To Surgery 03/24/21 1734 03/25/21 0512   03/24/21 1800  vancomycin (VANCOREADY) IVPB 1250 mg/250 mL        1,250 mg 166.7 mL/hr over 90 Minutes Intravenous To Surgery 03/24/21 1738 03/24/21 2114        Assessment/Plan Chronically incarcerated ventral hernia Bowel perforation -POD1 s/p ex lap, adhesiolysis, extended R hemicolectomy, primary incisional hernia repair, ileostomy creation, abdominal washout, drain placement x2, incisional wound vac placement 30x8x4cm - Dr. Bobbye Morton 04/02/21 - CT 12/27 with Large amount of free air, with additional extraluminal  air within the hernia sac. Findings compatible with a bowel perf, Midline abdominal wall hernia w/ dilatation of the ascending colon and cecum (proximal to the hernia site) with associated air-fluid levels, indicating mechanical obstruction, stable ascending aortic aneurysm - Hx exploratory laparotomy with adhesiolysis and incisional hernia repair on 02/02/2001. Appears this was repaired primarily.  - WOC consulted for new ileostomy and vac (vac changes starting 12/30) - start PICC/TNA given poor nutrition leading up to surgery and likely ileus - continue IV abx at least 4 days post op - drains x2 SS - continue drains  - extubation today per CCM, appreciate their assistance   FEN: NPO, IVF, PICC/TNA VTE: SCD's, SQH ID: [email or web address] 12/27 >>, diflucan 12/27>>, vanc 12/27 >>  Ascending aortic dissection s/p Type A/Arch repair 03/24/21 Dr. Cyndia Bent  CKD III HTN    LOS:  10 days    Winferd Humphrey , [facility] 04/03/2021, 8:04 AM Please see Amion for pager number during day hours 7:00am-4:30pm

## 2021-04-03 NOTE — Consult Note (Addendum)
WOC Nurse Consult Note: Consult received for MWF midline vac changes and ileostomy education and teaching. Will see patient on Friday 12/30. Please page if need prior to this date. Supplies ordered to be placed in patient room. 3 large black foam dressing kits [facility] # [id number].  [place] Nurse ostomy consult note Stoma type/location: RLQ ileostomy Supplies ordered to be placed in patient room 2 pc pouching system: Pouch [facility] # [phone] Skin barrier Kellie Simmering # 2 Barrier rings if needed [place] # [place]. Tamala Julian, MSN, RN, [place], Lysle Pearl, [facility] Wound Treatment Associate Pager [phone]

## 2021-04-03 NOTE — Progress Notes (Signed)
Too weak for extubation at present on SBT. Apparently this was an issue with last vent wean as well. Will leave with fent gtt for her ongoing abdominal pain, comfortable with current dosing. Patient updated  Erskine Emery MD PCCM

## 2021-04-03 NOTE — Progress Notes (Signed)
Peripherally Inserted Central Catheter Placement  The IV Nurse has discussed with the patient and/or persons authorized to consent for the patient, the purpose of this procedure and the potential benefits and risks involved with this procedure.  The benefits include less needle sticks, lab draws from the catheter, and the patient may be discharged home with the catheter. Risks include, but not limited to, infection, bleeding, blood clot (thrombus formation), and puncture of an artery; nerve damage and irregular heartbeat and possibility to perform a PICC exchange if needed/ordered by physician.  Alternatives to this procedure were also discussed.  Bard Power PICC patient education guide, fact sheet on infection prevention and patient information card has been provided to patient /or left at bedside.    PICC Placement Documentation  PICC Triple Lumen 04/03/21 PICC Right Brachial 40 cm 0 cm (Active)  Indication for Insertion or Continuance of Line Administration of hyperosmolar/irritating solutions (i.e. TPN, Vancomycin, etc.) 04/03/21 0858  Exposed Catheter (cm) 0 cm 04/03/21 0858  Site Assessment Clean;Dry;Intact 04/03/21 0858  Lumen #1 Status Flushed;Saline locked;Blood return noted 04/03/21 0858  Lumen #2 Status Flushed;Saline locked;Blood return noted 04/03/21 0858  Lumen #3 Status Flushed;Saline locked;Blood return noted 04/03/21 0858  Dressing Type Transparent 04/03/21 0858  Dressing Status Clean;Dry;Intact 04/03/21 0858  Antimicrobial disc in place? Yes 04/03/21 0858  Dressing Intervention New dressing;Other (Comment) 04/03/21 0858  Dressing Change Due 04/10/21 04/03/21 0858    Telephone consent signed by sister   Dorothy Harris 04/03/2021, 8:59 AM

## 2021-04-03 NOTE — Progress Notes (Signed)
NAME:  Dorothy Harris, MRN:  [id number], DOB:  Jan 31, 1961, LOS: 10 ADMISSION DATE:  03/24/2021, CONSULTATION DATE:  12/19 REFERRING MD:  Dr. Kris Mouton REASON FOR CONSULTATION: postanesthesia care  History of Present Illness:  This is a 60 year old female presented to [facility] ER 12/18 w/chest pain and SOB. 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 Started on esmolol, seen by CVTS; went to OR where she underwent repair and replacement of ascending aorta and replacement of aortic arch w/Hemashield graft. Returned to surgical ICU post op on 12/19. PCCM asked to assist w/ICU care.  PCCM signed off on 12/25.  Subsequently, the patient developed abdominal pain and returned to the operating theater on 12/27 after identification of abdominal free air on CT imaging.  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  Extended right hemicolectomy and ileostomy creation performed.  Case was discussed with Dr. Bonita Quin.  Spillage/soilage into the abdomen was reported.  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  Epigastric hernia   Significant Hospital Events: Including procedures, antibiotic start and stop dates in addition to other pertinent events   12/18 admitted w/ type A dissection. Underwent: Median Sternotomy Anastomosis of 8 mm Hemashield graft to the right axillary artery. Extracorporeal circulation via the right axillary artery and right atrial appendage Replacement of aortic arch under deep hypothermic circulatory arrest with bypass of the innominate, left common carotid and left subclavian arteries using a 30 x 10 x 8 x 8 x 10 mm Hemashield Platinum graft.  Supra-coronary replacement of the ascending aorta. Cross  clamp time of 169 minutes. Pt was noted to be coagulopathic intra-op  12/22 remains on cardene for hypertension.  12/24 finally off Cardene been complaining of abdominal pain.  General surgery evaluated her for a large abdominal hernia.  Will manage conservatively for now. 12/27 abdominal free air on CT.  Returned to the OR for extended right hemicolectomy and ileostomy creation.  Returned to 6C38 on mechanical ventilatory support.  PCCM reconsulted for postanesthesia care.  Interim History / Subjective:  On vent but waking up. Making urine.  Objective   Blood pressure 107/63, pulse 88, temperature 99.5 F (37.5 C), temperature source Oral, resp. rate 19, height 5\' 7"  (1.702 m), weight 97.1 kg, SpO2 94 %.    Vent Mode: PSV;CPAP FiO2 (%):  [40 %] 40 % Set Rate:  [16 bmp] 16 bmp Vt Set:  [500 mL] 500 mL PEEP:  [5 cmH20] 5 cmH20 Pressure Support:  [12 cmH20] 12 cmH20 Plateau Pressure:  [17 cmH20-21 cmH20] 18 cmH20   Intake/Output Summary (Last 24 hours) at 04/03/2021 0937 Last data filed at 04/03/2021 0800 Gross per 24 hour  Intake 5746.64 ml  Output 1440 ml  Net 4306.64 ml    Filed Weights   04/01/21 0351 04/02/21 0500 04/03/21 0500  Weight: 96.6 kg 95.8 kg 97.1 kg    Examination: No distress laying in bed intubated and sedated Will follow commands x 4 Midline abdominal incision with vac in place, small serosanguinous oupput, Jps in place as well Ostomy in place small serosanguinous output Lungs clear with occasional crackles at bases Triggering vent Heart sounds regular, ext warm  Renal function stable Cr ~2.5 Albumin 1.8 No CBC  Assessment & Plan:  Peritonitis secondary to incarcerated colon and perforated cecal volvulus and ischemic colon s/p extended R 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 Post-operative respiratory failure requiring mechanical ventilation- no immediate OR plans per CCS, okay to wean from vent AKI on CKD- stable s/p AAA repair w/ replacement of  ascending aorta and replacement of aortic arch w/ Hemashield graft 03/24/21 H/o poorly controlled HTN with LVH  - Check CBC - PICC and TPN - PR aspirin - Abx, drain management per CCS - Avoid nephrotoxins - SAT/SBT, work toward extubation - Will follow with you   Best Practice (right click and "Reselect all SmartList Selections" daily)   Diet/type: TPN DVT prophylaxis: Subcutaneous heparin GI prophylaxis: Protonix Lines: PICC Foley: Foley Code Status:  full code Last date of multidisciplinary goals of care discussion: per priumary   Patient critically ill due to respiratory failure Interventions to address this today vent titration Risk of deterioration without these interventions is high  I personally spent 33 minutes providing critical care not including any separately billable procedures  Erskine Emery MD [facility]  Prefer epic messenger for cross cover needs If after hours, please call E-link

## 2021-04-03 NOTE — Progress Notes (Signed)
EVENING ROUNDS NOTE :     [address]       [place] [address]             [phone]                 1 Day Post-Op Procedure(s) (LRB): EXPLORATORY LAPAROTOMY (N/A) EXTENDED COLON RESECTION ILEOSTOMY (N/A) HERNIA REPAIR VENTRAL ADULT LYSIS OF ADHESION, EXTENSIVE (N/A)   Total Length of Stay:  LOS: 10 days  Events:   Remains intubated Stable hemodynamics Making urine     BP (!) 101/56    Pulse 70    Temp 98.5 F (36.9 C) (Axillary)    Resp 16    Ht $R'5\' 7"'qN$  (1.702 m)    Wt 97.1 kg    SpO2 98%    BMI 33.53 kg/m      Vent Mode: PRVC FiO2 (%):  [40 %] 40 % Set Rate:  [16 bmp] 16 bmp Vt Set:  [500 mL] 500 mL PEEP:  [5 cmH20] 5 cmH20 Pressure Support:  [8 cmH20-12 cmH20] 10 cmH20 Plateau Pressure:  [17 cmH20-22 cmH20] 22 cmH20   fentaNYL infusion INTRAVENOUS 125 mcg/hr (04/03/21 1743)   fluconazole (DIFLUCAN) IV Stopped (04/03/21 0039)   lactated ringers 20 mL/hr at 04/03/21 1738   piperacillin-tazobactam (ZOSYN)  IV 12.5 mL/hr at 04/03/21 1743   propofol (DIPRIVAN) infusion Stopped (04/03/21 1002)   TPN ADULT (ION) 40 mL/hr at 04/03/21 1743   [START ON 04/04/2021] vancomycin      I/O last 3 completed shifts: In: 6109.3 [P.O.:1000; I.V.:2557.7; IV Piggyback:2551.6] Out: 2095 [Urine:1525; Drains:370; Blood:200]   CBC Latest Ref Rng & Units 04/03/2021 04/02/2021 04/01/2021  WBC 4.0 - 10.5 K/uL 18.7(H) - 20.6(H)  Hemoglobin 12.0 - 15.0 g/dL 9.2(L) 9.2(L) 9.2(L)  Hematocrit 36.0 - 46.0 % 28.0(L) 27.0(L) 28.5(L)  Platelets 150 - 400 K/uL 322 - 286    BMP Latest Ref Rng & Units 04/03/2021 04/02/2021 04/02/2021  Glucose 70 - 99 mg/dL 129(H) - 114(H)  BUN 6 - 20 mg/dL 66(H) - 68(H)  Creatinine 0.44 - 1.00 mg/dL 2.49(H) - 2.59(H)  BUN/Creat Ratio 6 - 22 (calc) - - -  Sodium 135 - 145 mmol/L 137 138 140  Potassium 3.5 - 5.1 mmol/L 3.9 3.9 3.9  Chloride 98 - 111 mmol/L 108 - 105  CO2 22 - 32 mmol/L 18(L) - 23  Calcium 8.9 - 10.3 mg/dL 7.7(L) -  8.8(L)    ABG    Component Value Date/Time   PHART 7.379 04/02/2021 1909   PCO2ART 35.1 04/02/2021 1909   PO2ART 63 (L) 04/02/2021 1909   HCO3 20.7 04/02/2021 1909   TCO2 22 04/02/2021 1909   ACIDBASEDEF 4.0 (H) 04/02/2021 1909   O2SAT 91.0 04/02/2021 1909       Melodie Bouillon, MD 04/03/2021 7:16 PM

## 2021-04-03 NOTE — Progress Notes (Signed)
OT Cancellation Note  Patient Details Name: Dorothy Harris MRN: [id number] DOB: 1960/09/26   Cancelled Treatment:    Reason Eval/Treat Not Completed: Medical issues which prohibited therapy Per RN, plan to extubate pt soon. Will follow-up for OT treatment as schedule permits  Layla Maw 04/03/2021, 11:15 AM

## 2021-04-03 NOTE — Progress Notes (Signed)
PHARMACY - TOTAL PARENTERAL NUTRITION CONSULT NOTE   Indication: Prolonged ileus    Patient Measurements: Height: 5\' 7"  (170.2 cm) Weight: 97.1 kg (214 lb 1.1 oz) IBW/kg (Calculated) : 61.6 TPN AdjBW (KG): 66 Body mass index is 33.53 kg/m. 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 Patient was then found to have a chronically incarcerated hernia with likely ileus now s/p exploratory laparotomy, adhesiolysis x65min, extended right hemicolectomy, primary incisional hernia repair, and ileostomy creation 12/27. Patient has had very minimal PO intake since 12/19 surgery and anticipate cannot start tube feeds for some time after abdominal surgery. Surgery anticipates post-op ileus. Patient is at risk for refeeding. Pharmacy consulted for TPN.  Glucose / Insulin: BG < 150, A1C 5.9, no hx DM Electrolytes: CO2 18, Mg 2.5,  others wnl  Renal: Scr 2.49 (BL ~2), BUN 66 Hepatic: AST slight elevated, Tbili 3.9, Albumin 1.8, TG 264 on propofol > paused 12/28 AM  Intake / Output; MIVF: LR bolus 1800 ml, D5LR @ 60 ml/hr, UOP ml/kg/hr, drains 215ml, net + 3.7L  GI Imaging: 12/18 CT abd: no findings 12/24 KUB: Gas within nondistended small bowel and mildly prominent right colon 12/25 KUB: gas in small bowel loops may suggest ileus 12/27 KUB: Increased gaseous distention of the right colon which could be due to postoperative ileus or colonic obstruction  12/27 CT abd: Large amount of free intraperitoneal air compatible with a bowel perforation; dilatation of the ascending colon and cecum indicating mechanical obstruction; descending colon is relatively decompressed indicating complete or high-grade partial obstruction  GI Surgeries / Procedures:  12/27:  exploratory laparotomy, adhesiolysis x14min,  extended right hemicolectomy, primary incisional hernia repair, and ileostomy creation  Central access: PICC 12/27 TPN start date: 12/28   Nutritional Goals: Goal TPN rate is 90 mL/hr (provides 60 g of protein, 259g dextrose, 60g lipids, and 2012 kcals per day)  RD Assessment: Estimated Needs Total Energy Estimated Needs: 1900-2100 kcals Total Protein Estimated Needs: 130-155 g Total Fluid Estimated Needs: >/= 1.9 L   Current Nutrition:  NPO and TPN Propofol running at 5.76- 17.28 ml/hr (provides ~1.1 kcal/ml)> paused 12/28 @9 :30am    Plan:  Start TPN at 40 mL/hr at 1800, provides 58g protein, 115g dextrose, 27g lipids, and 894 Kcal, meeting ~45% of needs Electrolytes in TPN: Na 95 mEq/L, K 40 mEq/L, Ca 5 mEq/L, Mg 0 mEq/L, and Phos 8 mmol/L. Cl:Ac 1:2 Add standard MVI and trace elements to TPN Initiate Sensitive q6h SSI and adjust as needed  Stop D5LR at 17:59 and change to LR at 20 mL/hr at 1800 Monitor TPN labs daily until stable at goal then on Mon/Thurs Watch TG on TPN given elevated on propofol    Benetta Spar, PharmD, BCPS, [facility] Clinical Pharmacist  Please check AMION for all [place] phone numbers After 10:00 PM, call [place]

## 2021-04-03 NOTE — Progress Notes (Signed)
1 Day Post-Op Procedure(s) (LRB): EXPLORATORY LAPAROTOMY (N/A) EXTENDED COLON RESECTION, CREATION OF ILEOSTOMY, AND EXTENSIVE LOA, AND REPAIR VNETRAL HERNIA Subjective: Intubated and sedated.   Objective: Vital signs in last 24 hours: Temp:  [97.9 F (36.6 C)-99.5 F (37.5 C)] 99.5 F (37.5 C) (12/28 0358) Pulse Rate:  [64-85] 83 (12/28 0700) Cardiac Rhythm: Normal sinus rhythm (12/28 0400) Resp:  [16-21] 17 (12/28 0700) BP: (95-149)/(53-136) 112/58 (12/28 0455) SpO2:  [92 %-100 %] 96 % (12/28 0700) Arterial Line BP: (79-138)/(48-67) 129/56 (12/28 0700) FiO2 (%):  [40 %] 40 % (12/28 0455) Weight:  [97.1 kg] 97.1 kg (12/28 0500)  Hemodynamic parameters for last 24 hours:    Intake/Output from previous day: 12/27 0701 - 12/28 0700 In: 5729.9 [P.O.:1000; I.V.:2229; IV Piggyback:2500.9] Out: 1440 [Urine:1040; Drains:200; Blood:200] Intake/Output this shift: No intake/output data recorded.  General appearance: intubated and sedated on vent Neurologic: eyes open, squeezed my hand.  Heart: regular rate and rhythm, S1, S2 normal, no murmur Lungs: coarse bilaterally Abdomen: soft, no bowel sounds normal Extremities: edema mild Wound: chest incisions healing ok with slight eschar in places. Vac in abdominal wound  Lab Results: Recent Labs    04/01/21 0400 04/02/21 1909  WBC 20.6*  --   HGB 9.2* 9.2*  HCT 28.5* 27.0*  PLT 286  --    BMET:  Recent Labs    04/02/21 0153 04/02/21 1909 04/03/21 0113  NA 140 138 137  K 3.9 3.9 3.9  CL 105  --  108  CO2 23  --  18*  GLUCOSE 114*  --  129*  BUN 68*  --  66*  CREATININE 2.59*  --  2.49*  CALCIUM 8.8*  --  7.7*    PT/INR: No results for input(s): LABPROT, INR in the last 72 hours. ABG    Component Value Date/Time   PHART 7.379 04/02/2021 1909   HCO3 20.7 04/02/2021 1909   TCO2 22 04/02/2021 1909   ACIDBASEDEF 4.0 (H) 04/02/2021 1909   O2SAT 91.0 04/02/2021 1909   CBG (last 3)  Recent Labs    04/02/21 1344  04/02/21 1624 04/02/21 2052  GLUCAP 122* 137* 128*   Postop CXR ok  Assessment/Plan: POD 10 Repair of aortic dissection  POD 1 S/P Procedure(s) (LRB): EXPLORATORY LAPAROTOMY (N/A) EXTENDED COLON RESECTION, CREATION OF ILEOSTOMY, AND EXTENSIVE LOA, AND REPAIR VNETRAL HERNIA  She has been relatively stable overnight on the vent. Discussed with CCM and they will followup on her today.  Continue broad spectrum antibiotics for abdominal contamination especially with new vascular grafts in place.  Severe malnutrition: will need to start on TNA today since it will be a while until she can start on tube feeds and she was already nutritionally depleted over the past week.  Stage 3 CKD: maintain adequate MAP and volume status. No ACE I or ARB.   LOS: 10 days    Gaye Pollack 04/03/2021

## 2021-04-03 NOTE — Anesthesia Postprocedure Evaluation (Signed)
Anesthesia Post Note  Patient: Dorothy Harris  Procedure(s) Performed: EXPLORATORY LAPAROTOMY (Abdomen) EXTENDED COLON RESECTION, CREATION OF ILEOSTOMY, AND EXTENSIVE LOA, AND REPAIR VNETRAL HERNIA (Abdomen)     Patient location during evaluation: SICU Anesthesia Type: General Level of consciousness: sedated Pain management: pain level controlled Vital Signs Assessment: post-procedure vital signs reviewed and stable Respiratory status: patient remains intubated per anesthesia plan Cardiovascular status: stable Postop Assessment: no apparent nausea or vomiting Anesthetic complications: no   No notable events documented.  Last Vitals:  Vitals:   04/03/21 0500 04/03/21 0600  BP:    Pulse: 85 77  Resp: 16 16  Temp:    SpO2: 100% 96%    Last Pain:  Vitals:   04/03/21 0358  TempSrc: Oral  PainSc:                  March Rummage Dashayla Theissen

## 2021-04-03 NOTE — Progress Notes (Signed)
PT Cancellation Note  Patient Details Name: Dorothy Harris MRN: [id number] DOB: Dec 07, 1960   Cancelled Treatment:    Reason Eval/Treat Not Completed: Medical issues which prohibited therapy - Per RN, plans to extubated soon; will follow-up for PT treatment this afternoon as schedule permits.  Mabeline Caras, PT, DPT Acute Rehabilitation Services  Pager [phone] Office [place] 04/03/2021, 10:34 AM

## 2021-04-03 NOTE — Progress Notes (Signed)
Initial Nutrition Assessment  DOCUMENTATION CODES:   Non-severe (moderate) malnutrition in context of acute illness/injury  INTERVENTION:   TPN to meet nutritional needs; TPN order per pharmacy  Monitor magnesium, potassium, and phosphorus BID for at least 3 days, MD to replete as needed, as pt is at risk for refeeding syndrome given prolonged inadequate nutrition intake.   NUTRITION DIAGNOSIS:   Moderate Malnutrition related to acute illness (aortic disection repair, bowel perforation) as evidenced by mild muscle depletion, energy intake < or equal to 50% for > or equal to 5 days.   GOAL:   Patient will meet greater than or equal to 90% of their needs   MONITOR:   Vent status, Labs, Weight trends, Skin (TPN, gut function)  REASON FOR ASSESSMENT:   Consult New TPN/TNA  ASSESSMENT:   60 yo female admitted with aortic dissection requiring surgery; postop developed abdominal pain and found to have incarcerated colon, cecal volvulus with perforation and ischemic colon. PMH includes HTN, hernia, smoker.  12/18 Admit, Repair of Type A dissection, median sternotomy 12/24 Abd pain with Large abd hernia 12/27 OR-ex lap, adhesiolysis, extended R hemicolectomy, prinary incisional hernia repair, ileostomy creation, abd washout, drain placement x 2, incisional wound vac placement 30x8x4cm  Noted plan for extubation today but pt too weak at present Propofol on hold currently for weaning  Plan for PICC line placement with initiation of TPN today Essentially no po intake sine admission. Recorded po intake 0-5%. NPO until 12/20 when diet advanced to CL, then advanced to Heart Healthy on 12/24 but made NPO again. Inadequate oral intake x 10 days   Incisional Wound VAC in place-minimal output, JP drains x 2-200 mL out on night shift, 50 mL today  Weight up post-op; weight of 95.8 kg yesterday, current wt 97.1 kg. Admit weight 102 kg. Net + per I/O flow sheet, +edema on exam.   Labs:  potassium 3.9 (wdl), phosphorus 4.3 (wdl), magnesium 2.5 (H), Creatinine 2.49, BUN 66, CBGs 122-137 (wdl)-ICU goal 140-180 Meds: D5-LR at 60 ml/hr   NUTRITION - FOCUSED PHYSICAL EXAM:  Flowsheet Row Most Recent Value  Orbital Region No depletion  Upper Arm Region No depletion  Thoracic and Lumbar Region No depletion  Buccal Region Unable to assess  Temple Region Mild depletion  Clavicle Bone Region Mild depletion  Clavicle and Acromion Bone Region Mild depletion  Scapular Bone Region Mild depletion  Dorsal Hand No depletion  Patellar Region Mild depletion  Anterior Thigh Region Mild depletion  Posterior Calf Region Unable to assess  Edema (RD Assessment) Mild       Diet Order:   Diet Order             Diet NPO time specified  Diet effective now                   EDUCATION NEEDS:   Not appropriate for education at this time  Skin:  Skin Assessment: Skin Integrity Issues: Skin Integrity Issues:: Wound VAC, Other (Comment) Wound Vac: incisional-abdomen Incisions: chest, abdomen Other: new ileostomy  Last BM:  no documented BM since admission, new ileostomy-no output  Height:   Ht Readings from Last 1 Encounters:  03/24/21 $RemoveB'5\' 7"'iaAVZEGH$  (1.702 m)    Weight:   Wt Readings from Last 1 Encounters:  04/03/21 97.1 kg     BMI:  Body mass index is 33.53 kg/m.  Estimated Nutritional Needs:   Kcal:  1900-2100 kcals  Protein:  130-155 g  Fluid:  >/= 1.9  L    Kerman Passey MS, RDN, LDN, CNSC Registered Dietitian III Clinical Nutrition RD Pager and On-Call Pager Number Located in [place]

## 2021-04-03 NOTE — Progress Notes (Signed)
Pharmacy Antibiotic Note  Dorothy Harris is a 60 y.o. female admitted on 03/24/2021 initially with chest pain and repair of acute aortic dissection on 12/19, now presenting with free air on CT and  intra-abdominal infection .  Pharmacy has been consulted for Zosyn dosing. First dose Zosyn given at 1750 PM. Patient now s/p exlap with extended colon resection, ileostomy, extensive lysis of adhesions, and repair of ventral hernia. CCM adding Vancomycin with recent multiple surgeries. Fluconazole per MD. Patient with CKD - SCr currently up at 2.59 (was 2.1 on admission). Last Vancomycin given for surgical prophylaxis on 12/19.   Scr fairly stable today.  Est CrCl ~ 30 ml/min  Plan: Zosyn 3.375g IV q8h (4 hour infusion). Vancomycin 1250 mg IV q 48 hrs.  Calculated AUC 541 based on Scr 2.49.  Next dose tomorrow. Watch renal function, cultures and clinical course.  Height: $Remove'5\' 7"'FjblQvF$  (170.2 cm) Weight: 97.1 kg (214 lb 1.1 oz) IBW/kg (Calculated) : 61.6  Temp (24hrs), Avg:98.9 F (37.2 C), Min:98.3 F (36.8 C), Max:99.5 F (37.5 C)  Recent Labs  Lab 03/29/21 0214 03/30/21 0439 03/31/21 0218 03/31/21 1125 04/01/21 0400 04/02/21 0153 04/03/21 0113 04/03/21 1133  WBC 13.2* 19.0* 19.9*  --  20.6*  --   --  18.7*  CREATININE 2.26* 2.40* 2.46*  --  2.64* 2.59* 2.49*  --   LATICACIDVEN  --   --   --  0.8  --   --   --   --      Estimated Creatinine Clearance: 28.8 mL/min (A) (by C-G formula based on SCr of 2.49 mg/dL (H)).    No Known Allergies  Antimicrobials this admission: 12/27 Zosyn >>  12/27 Fluconazole >> 12/27 Vancomycin >>  Dose adjustments this admission:  Microbiology results: 12/27 MRSA PCR: negative  Thank you for allowing pharmacy to be a part of this patients care.  [place] Crane, Roylene Reason, BCCP Clinical Pharmacist  04/03/2021 3:01 PM   [facility] pharmacy phone numbers are listed on [email or web address]

## 2021-04-04 ENCOUNTER — Telehealth (HOSPITAL_COMMUNITY): Payer: Self-pay

## 2021-04-04 LAB — GLUCOSE, CAPILLARY
Glucose-Capillary: 161 mg/dL — ABNORMAL HIGH (ref 70–99)
Glucose-Capillary: 163 mg/dL — ABNORMAL HIGH (ref 70–99)
Glucose-Capillary: 163 mg/dL — ABNORMAL HIGH (ref 70–99)
Glucose-Capillary: 166 mg/dL — ABNORMAL HIGH (ref 70–99)
Glucose-Capillary: 176 mg/dL — ABNORMAL HIGH (ref 70–99)
Glucose-Capillary: 181 mg/dL — ABNORMAL HIGH (ref 70–99)
Glucose-Capillary: 184 mg/dL — ABNORMAL HIGH (ref 70–99)
Glucose-Capillary: 465 mg/dL — ABNORMAL HIGH (ref 70–99)

## 2021-04-04 LAB — PREPARE RBC (CROSSMATCH)

## 2021-04-04 LAB — COMPREHENSIVE METABOLIC PANEL
ALT: 62 U/L — ABNORMAL HIGH (ref 0–44)
AST: 65 U/L — ABNORMAL HIGH (ref 15–41)
Albumin: 1.6 g/dL — ABNORMAL LOW (ref 3.5–5.0)
Alkaline Phosphatase: 146 U/L — ABNORMAL HIGH (ref 38–126)
Anion gap: 11 (ref 5–15)
BUN: 71 mg/dL — ABNORMAL HIGH (ref 6–20)
CO2: 21 mmol/L — ABNORMAL LOW (ref 22–32)
Calcium: 7.6 mg/dL — ABNORMAL LOW (ref 8.9–10.3)
Chloride: 108 mmol/L (ref 98–111)
Creatinine, Ser: 2.72 mg/dL — ABNORMAL HIGH (ref 0.44–1.00)
GFR, Estimated: 19 mL/min — ABNORMAL LOW (ref >60)
Glucose, Bld: 193 mg/dL — ABNORMAL HIGH (ref 70–99)
Potassium: 4 mmol/L (ref 3.5–5.1)
Sodium: 140 mmol/L (ref 135–145)
Total Bilirubin: 3.2 mg/dL — ABNORMAL HIGH (ref 0.3–1.2)
Total Protein: 5.2 g/dL — ABNORMAL LOW (ref 6.5–8.1)

## 2021-04-04 LAB — CBC
HCT: 23.4 % — ABNORMAL LOW (ref 36.0–46.0)
Hemoglobin: 7.8 g/dL — ABNORMAL LOW (ref 12.0–15.0)
MCH: 32.4 pg (ref 26.0–34.0)
MCHC: 33.3 g/dL (ref 30.0–36.0)
MCV: 97.1 fL (ref 80.0–100.0)
Platelets: 267 10*3/uL (ref 150–400)
RBC: 2.41 MIL/uL — ABNORMAL LOW (ref 3.87–5.11)
RDW: 18.3 % — ABNORMAL HIGH (ref 11.5–15.5)
WBC: 13.3 10*3/uL — ABNORMAL HIGH (ref 4.0–10.5)
nRBC: 0.3 % — ABNORMAL HIGH (ref 0.0–0.2)

## 2021-04-04 LAB — MAGNESIUM: Magnesium: 2.2 mg/dL (ref 1.7–2.4)

## 2021-04-04 LAB — PHOSPHORUS: Phosphorus: 3.8 mg/dL (ref 2.5–4.6)

## 2021-04-04 MED ORDER — MORPHINE SULFATE (PF) 2 MG/ML IV SOLN
2.0000 mg | INTRAVENOUS | Status: DC | PRN
Start: 2021-04-04 — End: 2021-04-09
  Administered 2021-04-04 – 2021-04-05 (×3): 2 mg via INTRAVENOUS
  Administered 2021-04-05: 08:00:00 4 mg via INTRAVENOUS
  Administered 2021-04-05 – 2021-04-09 (×16): 2 mg via INTRAVENOUS
  Filled 2021-04-04: qty 2
  Filled 2021-04-04 (×11): qty 1
  Filled 2021-04-04: qty 2
  Filled 2021-04-04 (×8): qty 1

## 2021-04-04 MED ORDER — ALBUMIN HUMAN 25 % IV SOLN
25.0000 g | Freq: Four times a day (QID) | INTRAVENOUS | Status: AC
  Administered 2021-04-04 (×3): 12.5 g via INTRAVENOUS
  Filled 2021-04-04 (×3): qty 100

## 2021-04-04 MED ORDER — AMIODARONE LOAD VIA INFUSION
150.0000 mg | Freq: Once | INTRAVENOUS | Status: AC
  Administered 2021-04-04: 13:00:00 150 mg via INTRAVENOUS
  Filled 2021-04-04: qty 83.34

## 2021-04-04 MED ORDER — AMIODARONE HCL IN DEXTROSE 360-4.14 MG/200ML-% IV SOLN
30.0000 mg/h | INTRAVENOUS | Status: DC
  Administered 2021-04-04 – 2021-04-07 (×7): 30 mg/h via INTRAVENOUS
  Filled 2021-04-04 (×7): qty 200

## 2021-04-04 MED ORDER — INSULIN ASPART 100 UNIT/ML IJ SOLN
0.0000 [IU] | INTRAMUSCULAR | Status: DC
Start: 2021-04-04 — End: 2021-04-06
  Administered 2021-04-04 – 2021-04-05 (×4): 2 [IU] via SUBCUTANEOUS
  Administered 2021-04-05: 21:00:00 3 [IU] via SUBCUTANEOUS
  Administered 2021-04-05: 04:00:00 2 [IU] via SUBCUTANEOUS
  Administered 2021-04-05: 09:00:00 3 [IU] via SUBCUTANEOUS
  Administered 2021-04-05: 17:00:00 2 [IU] via SUBCUTANEOUS
  Administered 2021-04-05 – 2021-04-06 (×2): 3 [IU] via SUBCUTANEOUS
  Administered 2021-04-06: 2 [IU] via SUBCUTANEOUS

## 2021-04-04 MED ORDER — FENTANYL BOLUS VIA INFUSION
50.0000 ug | INTRAVENOUS | Status: DC | PRN
  Filled 2021-04-04: qty 50

## 2021-04-04 MED ORDER — FENTANYL CITRATE PF 50 MCG/ML IJ SOSY
50.0000 ug | PREFILLED_SYRINGE | INTRAMUSCULAR | Status: DC | PRN
  Administered 2021-04-04 (×2): 50 ug via INTRAVENOUS
  Filled 2021-04-04 (×3): qty 1

## 2021-04-04 MED ORDER — TRAVASOL 10 % IV SOLN
INTRAVENOUS | Status: AC
  Filled 2021-04-04: qty 1024.8

## 2021-04-04 MED ORDER — LACTATED RINGERS IV SOLN: INTRAVENOUS | Status: AC

## 2021-04-04 MED ORDER — LACTATED RINGERS IV BOLUS: 1000.0000 mL | Freq: Once | INTRAVENOUS | Status: DC

## 2021-04-04 MED ORDER — ORAL CARE MOUTH RINSE
15.0000 mL | Freq: Two times a day (BID) | OROMUCOSAL | Status: DC
  Administered 2021-04-04 – 2021-04-15 (×11): 15 mL via OROMUCOSAL

## 2021-04-04 MED ORDER — CHLORHEXIDINE GLUCONATE 0.12 % MT SOLN
15.0000 mL | Freq: Two times a day (BID) | OROMUCOSAL | Status: DC
  Administered 2021-04-04 – 2021-04-15 (×21): 15 mL via OROMUCOSAL
  Filled 2021-04-04 (×21): qty 15

## 2021-04-04 MED ORDER — AMIODARONE HCL IN DEXTROSE 360-4.14 MG/200ML-% IV SOLN
60.0000 mg/h | INTRAVENOUS | Status: AC
  Administered 2021-04-04 (×2): 60 mg/h via INTRAVENOUS
  Filled 2021-04-04 (×2): qty 200

## 2021-04-04 MED ORDER — SODIUM CHLORIDE 0.9% IV SOLUTION: Freq: Once | INTRAVENOUS | Status: AC

## 2021-04-04 NOTE — Progress Notes (Signed)
OT Cancellation Note  Patient Details Name: Dorothy Harris MRN: [id number] DOB: September 14, 1960   Cancelled Treatment:    Reason Eval/Treat Not Completed: Patient not medically ready Currently planning to extubate. Plan to check back in PM as schedule permits  Layla Maw 04/04/2021, 9:53 AM

## 2021-04-04 NOTE — Progress Notes (Addendum)
Occupational Therapy Treatment Patient Details Name: Dorothy Harris MRN: [id number] DOB: 1960-06-15 Today's Date: 04/04/2021   History of present illness The pt is a 60 yo female presenting 12/18 with c/o chest pain while cleaning house, given 2 NG on arrival with improvement in sx. 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 Extubated 12/20. During admission, pt experienced increased abdominal pain and found to have free air in abdomen. Pt underwent exploratory laparotomy on 12/27 with resultant extended right hemicolectomy and ileostomy creation. Pt remained intubated after this procedure and extubated on 12/29.  PMH includes: CAD, NSTEMI, descending thoracic dissection for which pt has not followed up with vascular surgery, HTN, and current tobacco use.   OT comments  Pt seen for first OT session since procedures on 12/27 and extubation today. Despite complexities, pt able to tolerate bed mobility and standing attempts with +2 physical assistance. Due to posterior lean and strength deficits, unable to successfully take steps at bedside today. Plan to progress sitting tolerance with EOB ADLs and trial Stedy in next sessions. Continue to rec SNF rehab at DC. However, if pt able to demo improved activity tolerance and steady progress, would recommend consideration of inpatient rehab.  SpO2 WFL on 5 L O2 HR up to 153bpm   Recommendations for follow up therapy are one component of a multi-disciplinary discharge planning process, led by the attending physician.  Recommendations may be updated based on patient status, additional functional criteria and insurance authorization.    Follow Up Recommendations  Skilled nursing-short term rehab (<3 hours/day)    Assistance Recommended at Discharge Frequent or constant Supervision/Assistance  Equipment Recommendations  Other (comment)  (TBD pending progress)    Recommendations for Other Services      Precautions / Restrictions Precautions Precautions: Sternal;Fall;Other (comment) Precaution Comments: monitor O2, HR; abdominal wound vac, 2 JP drains, A line Restrictions Weight Bearing Restrictions: No       Mobility Bed Mobility Overal bed mobility: Needs Assistance Bed Mobility: Rolling;Sidelying to Sit;Sit to Sidelying Rolling: Mod assist Sidelying to sit: Mod assist;+2 for safety/equipment;HOB elevated     Sit to sidelying: Max assist;+2 for physical assistance;+2 for safety/equipment General bed mobility comments: Guided pt in log rolling to minimize abdominal pain. Overall Mod A to bring LEs off of bed and lift trunk (+2 needed due to lines). Max A x 2 to get back in bed for LE mgmt, guiding trunk and line mgmt    Transfers Overall transfer level: Needs assistance Equipment used: 2 person hand held assist Transfers: Sit to/from Stand Sit to Stand: Mod assist;+2 physical assistance           General transfer comment: Via handheld assist, pt able to demo momentum to assist with standing, posterior lean with assist needed to block L foot from sliding due to excessive knee extension and to assist in extending R knee to maintain standing approx 5 seconds.     Balance Overall balance assessment: Needs assistance Sitting-balance support: No upper extremity supported;Feet supported Sitting balance-Leahy Scale: Fair     Standing balance support: Bilateral upper extremity supported;During functional activity;Reliant on assistive device for balance Standing balance-Leahy Scale: Poor                             ADL either performed or assessed with clinical judgement  ADL Overall ADL's : Needs assistance/impaired                     Lower Body Dressing: Total assistance;Bed level                      Extremity/Trunk Assessment Upper Extremity Assessment Upper Extremity  Assessment: Generalized weakness   Lower Extremity Assessment Lower Extremity Assessment: Defer to PT evaluation        Vision   Vision Assessment?: No apparent visual deficits   Perception     Praxis      Cognition Arousal/Alertness: Awake/alert Behavior During Therapy: Flat affect Overall Cognitive Status: Within Functional Limits for tasks assessed                                 General Comments: pleasant, follows directions well, appropriate responses          Exercises     Shoulder Instructions       General Comments SpO2 WFL on 5 L O2; HR up to 153bpm with activity, normalizes with rest break (a fib). Endorses some dizziness sitting EOB that did not progress with activity    Pertinent Vitals/ Pain       Pain Assessment: Faces Faces Pain Scale: Hurts little more Pain Location: abdomen with initiation of bed mobility Pain Descriptors / Indicators: Discomfort;Grimacing Pain Intervention(s): Monitored during session;Limited activity within patient's tolerance  Home Living                                          Prior Functioning/Environment              Frequency  Min 2X/week        Progress Toward Goals  OT Goals(current goals can now be found in the care plan section)  Progress towards OT goals: Progressing toward goals  Acute Rehab OT Goals Patient Stated Goal: decrease pain, regain strength OT Goal Formulation: With patient Time For Goal Achievement: 04/12/21 Potential to Achieve Goals: Good ADL Goals Pt Will Perform Grooming: with supervision;standing Pt Will Perform Upper Body Bathing: with set-up;with supervision;sitting Pt Will Perform Lower Body Bathing: with supervision;with set-up;sit to/from stand Pt Will Perform Upper Body Dressing: with set-up;with supervision;sitting Pt Will Perform Lower Body Dressing: with set-up;with supervision;with adaptive equipment;sit to/from stand Pt Will Transfer to  Toilet: with supervision;ambulating;bedside commode Pt Will Perform Toileting - Clothing Manipulation and hygiene: with supervision;sit to/from stand Additional ADL Goal #1: pt will be S in and OOB for basic ADLs following sternal precautions  Plan Discharge plan remains appropriate    Co-evaluation    PT/OT/SLP Co-Evaluation/Treatment: Yes Reason for Co-Treatment: To address functional/ADL transfers;For patient/therapist safety;Complexity of the patient's impairments (multi-system involvement)   OT goals addressed during session: ADL's and self-care;Strengthening/ROM      AM-PAC OT "6 Clicks" Daily Activity     Outcome Measure   Help from another person eating meals?: A Lot Help from another person taking care of personal grooming?: A Lot Help from another person toileting, which includes using toliet, bedpan, or urinal?: Total Help from another person bathing (including washing, rinsing, drying)?: A Lot Help from another person to put on and taking off regular upper body clothing?: A Lot Help from another person to put on and taking off regular lower body  clothing?: Total 6 Click Score: 10    End of Session Equipment Utilized During Treatment: Oxygen  OT Visit Diagnosis: Unsteadiness on feet (R26.81);Other abnormalities of gait and mobility (R26.89);Muscle weakness (generalized) (M62.81);Pain Pain - part of body:  (abdomen)   Activity Tolerance Patient tolerated treatment well   Patient Left in bed;with call bell/phone within reach;with nursing/sitter in room   Nurse Communication Mobility status        Time: 1353-1420 OT Time Calculation (min): 27 min  Charges: OT General Charges $OT Visit: 1 Visit OT Treatments $Therapeutic Activity: 8-22 mins  Malachy Chamber, OTR/L Acute Rehab Services Office: [phone]   Layla Maw 04/04/2021, 2:34 PM

## 2021-04-04 NOTE — Progress Notes (Signed)
[facility] Progress Note Patient Name: Dorothy Harris DOB: Oct 13, 1960 MRN: [id number]   Date of Service  04/04/2021  HPI/Events of Note  Multiple surgeries, extubated today nurse asking if pain med coud be increased?  getting fentynl 52mcg/hr not lasting  eICU Interventions   Dc fent Use morphine 2-4 instead for longer effect     Intervention Category Intermediate Interventions: Pain - evaluation and management  Dorothy Harris V. Leannah Guse 04/04/2021, 11:09 PM

## 2021-04-04 NOTE — Progress Notes (Signed)
PT Cancellation Note  Patient Details Name: Dorothy Harris MRN: [id number] DOB: 11-Sep-1960   Cancelled Treatment:    Reason Eval/Treat Not Completed: Being prepped for extubation; will plan to check back this afternoon for PT treatment as schedule permits.  Mabeline Caras, PT, DPT Acute Rehabilitation Services  Pager [phone] Office [place] 04/04/2021, 9:58 AM

## 2021-04-04 NOTE — Progress Notes (Signed)
Physical Therapy Treatment Patient Details Name: Dorothy Harris MRN: [id number] DOB: 16-Apr-1960 Today's Date: 04/04/2021   History of Present Illness Pt is a 60 y.o. female admitted 03/24/21 with chest pain. 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 PMH includes CAD, NSTEMI, HTN, tobacco use, prior thoracic aorta dissection for which pt did not follow up with vascular sx.   PT Comments    Pt now post-op R hemicolectomy and ileostomy requiring additional intubation. Pt motivated to participate despite fatigue post-extubation and abdominal discomfort. Pt tolerated bed mobility and standing trial with modA+2; would likely benefit from use of stedy next session. Pending activity tolerance, feel pt would benefit from intensive CIR-level therapies to maximize functional mobility and independence prior to return home.    Recommendations for follow up therapy are one component of a multi-disciplinary discharge planning process, led by the attending physician.  Recommendations may be updated based on patient status, additional functional criteria and insurance authorization.  Follow Up Recommendations  Acute inpatient rehab (3hours/day)     Assistance Recommended at Discharge Frequent or constant Supervision/Assistance  Equipment Recommendations  Other (comment) (TBD)    Recommendations for Other Services       Precautions / Restrictions Precautions Precautions: Sternal;Fall;Other (comment) Precaution Comments: monitor O2, HR; abdominal wound vac, 2 JP drains, A line Restrictions Weight Bearing Restrictions: No     Mobility  Bed Mobility Overal bed mobility: Needs Assistance Bed Mobility: Rolling;Sidelying to Sit;Sit to  Sidelying Rolling: Mod assist Sidelying to sit: Mod assist;+2 for safety/equipment;HOB elevated     Sit to sidelying: Max assist;+2 for physical assistance;+2 for safety/equipment General bed mobility comments: Guided pt in log rolling to minimize abdominal pain. Overall Mod A to bring LEs off of bed and lift trunk (+2 needed due to lines). Max A x 2 to get back in bed for LE mgmt, guiding trunk and line mgmt    Transfers Overall transfer level: Needs assistance Equipment used: 2 person hand held assist Transfers: Sit to/from Stand Sit to Stand: Mod assist;+2 physical assistance           General transfer comment: Via handheld assist, pt able to demo momentum to assist with standing, posterior lean with assist needed to block L foot from sliding due to excessive knee extension and to assist in extending R knee (prevent buckling) to maintain standing approx 5 seconds.    Ambulation/Gait                   Stairs             Wheelchair Mobility    Modified Rankin (Stroke Patients Only)       Balance Overall balance assessment: Needs assistance Sitting-balance support: No upper extremity supported;Feet supported Sitting balance-Leahy Scale: Fair     Standing balance support: Bilateral upper extremity supported;During functional activity;Reliant on assistive device for balance Standing balance-Leahy Scale: Poor Standing balance comment: dependent on BUE support and external assist                            Cognition Arousal/Alertness: Awake/alert Behavior During Therapy: Flat affect Overall Cognitive Status: Within Functional Limits for tasks assessed  General Comments: pleasant, follows directions well, appropriate responses        Exercises General Exercises - Lower Extremity Ankle Circles/Pumps: AROM;Both;Supine;5 reps Quad Sets: AROM;Both;Supine;5 reps Short Arc Quad: AROM;Both;Supine;5  reps Heel Slides: AAROM;Both;Supine;5 reps    General Comments General comments (skin integrity, edema, etc.): SpO2 WFL on 5 L O2; HR up to 153bpm with activity, normalizes with rest break (a fib). Endorses some dizziness sitting EOB that did not progress with activity      Pertinent Vitals/Pain Pain Assessment: Faces Faces Pain Scale: Hurts little more Pain Location: abdomen with initiation of bed mobility Pain Descriptors / Indicators: Discomfort;Grimacing Pain Intervention(s): Monitored during session;Limited activity within patient's tolerance    Home Living                          Prior Function            PT Goals (current goals can now be found in the care plan section) Acute Rehab PT Goals Patient Stated Goal: Return home PT Goal Formulation: With patient Time For Goal Achievement: 04/18/21 Potential to Achieve Goals: Good Progress towards PT goals: Progressing toward goals    Frequency    Min 3X/week      PT Plan Discharge plan needs to be updated    Co-evaluation PT/OT/SLP Co-Evaluation/Treatment: Yes Reason for Co-Treatment: Complexity of the patient's impairments (multi-system involvement);For patient/therapist safety;To address functional/ADL transfers PT goals addressed during session: Mobility/safety with mobility;Balance OT goals addressed during session: ADL's and self-care;Strengthening/ROM      AM-PAC PT "6 Clicks" Mobility   Outcome Measure  Help needed turning from your back to your side while in a flat bed without using bedrails?: A Lot Help needed moving from lying on your back to sitting on the side of a flat bed without using bedrails?: A Lot Help needed moving to and from a bed to a chair (including a wheelchair)?: Total Help needed standing up from a chair using your arms (e.g., wheelchair or bedside chair)?: Total Help needed to walk in hospital room?: Total Help needed climbing 3-5 steps with a railing? : Total 6 Click  Score: 8    End of Session Equipment Utilized During Treatment: Oxygen Activity Tolerance: Patient tolerated treatment well;Patient limited by fatigue Patient left: in bed;with call bell/phone within reach;with nursing/sitter in room Nurse Communication: Mobility status PT Visit Diagnosis: Other abnormalities of gait and mobility (R26.89);Muscle weakness (generalized) (M62.81)     Time: 1497-0263 PT Time Calculation (min) (ACUTE ONLY): 27 min  Charges:  $Therapeutic Activity: 8-22 mins                     Mabeline Caras, PT, DPT Acute Rehabilitation Services  Pager [phone] Office [phone]  Derry Lory 04/04/2021, 3:18 PM

## 2021-04-04 NOTE — Progress Notes (Addendum)
[facility] DAILY ICU PROGRESS NOTE                   [address]            [place] [address]          [phone]   2 Days Post-Op Procedure(s) (LRB): EXPLORATORY LAPAROTOMY (N/A) EXTENDED COLON RESECTION ILEOSTOMY (N/A) HERNIA REPAIR VENTRAL ADULT LYSIS OF ADHESION, EXTENSIVE (N/A)  Total Length of Stay:  LOS: 11 days   Subjective: Patient intubated, awake this am  Objective: Vital signs in last 24 hours: Temp:  [98.1 F (36.7 C)-99.3 F (37.4 C)] 98.1 F (36.7 C) (12/29 0403) Pulse Rate:  [57-89] 59 (12/29 0700) Cardiac Rhythm: Normal sinus rhythm (12/29 0400) Resp:  [15-31] 16 (12/29 0700) BP: (93-144)/(55-78) 97/56 (12/29 0400) SpO2:  [94 %-100 %] 100 % (12/29 0700) Arterial Line BP: (109-170)/(50-70) 115/51 (12/29 0700) FiO2 (%):  [40 %] 40 % (12/29 0400) Weight:  [98.2 kg] 98.2 kg (12/29 0500)  Filed Weights   04/02/21 0500 04/03/21 0500 04/04/21 0500  Weight: 95.8 kg 97.1 kg 98.2 kg    Weight change: 1.1 kg   Hemodynamic parameters for last 24 hours:    Intake/Output from previous day: 12/28 0701 - 12/29 0700 In: 1271 [I.V.:1013.8; IV Piggyback:257.2] Out: 1095 [Urine:875; Drains:220]  Intake/Output this shift: No intake/output data recorded.  Current Meds: Scheduled Meds:  aspirin  300 mg Rectal Daily   chlorhexidine gluconate (MEDLINE KIT)  15 mL Mouth Rinse BID   Chlorhexidine Gluconate Cloth  6 each Topical Daily   heparin  5,000 Units Subcutaneous Q8H   insulin aspart  0-9 Units Subcutaneous Q6H   mouth rinse  15 mL Mouth Rinse 10 times per day   pantoprazole (PROTONIX) IV  40 mg Intravenous QHS   sodium chloride flush  10-40 mL Intracatheter Q12H   sodium chloride flush  10-40 mL Intracatheter Q12H   Continuous Infusions:  fentaNYL infusion INTRAVENOUS 150 mcg/hr (04/04/21 0600)   fluconazole (DIFLUCAN) IV Stopped (04/03/21 2350)   lactated ringers 20 mL/hr at 04/03/21 1738   piperacillin-tazobactam (ZOSYN)  IV 12.5 mL/hr  at 04/04/21 0600   propofol (DIPRIVAN) infusion Stopped (04/03/21 1002)   TPN ADULT (ION) 40 mL/hr at 04/04/21 0600   vancomycin     PRN Meds:.albuterol, fentaNYL, hydrALAZINE, LORazepam, ondansetron (ZOFRAN) IV, sodium chloride flush, sodium chloride flush  General appearance: cooperative and no distress Neurologic: Able to follow commands this am Heart: RRR, no murmur Lungs: clear to auscultation bilaterally Abdomen: Soft, obese, , occasional bowel sounds Extremities: SCDs, boots in place Wound: Median sternotomy and right axillary wounds are clean and dry. VAC in place on abdominal wound  Lab Results: CBC: Recent Labs    04/03/21 1133 04/04/21 0234  WBC 18.7* 13.3*  HGB 9.2* 7.8*  HCT 28.0* 23.4*  PLT 322 267   BMET:  Recent Labs    04/03/21 0113 04/04/21 0234  NA 137 140  K 3.9 4.0  CL 108 108  CO2 18* 21*  GLUCOSE 129* 193*  BUN 66* 71*  CREATININE 2.49* 2.72*  CALCIUM 7.7* 7.6*    CMET: Lab Results  Component Value Date   WBC 13.3 (H) 04/04/2021   HGB 7.8 (L) 04/04/2021   HCT 23.4 (L) 04/04/2021   PLT 267 04/04/2021   GLUCOSE 193 (H) 04/04/2021   CHOL 220 (H) 08/21/2017   TRIG 264 (H) 04/03/2021   HDL 50 (L) 08/21/2017   LDLCALC 133 (H) 08/21/2017  ALT 62 (H) 04/04/2021   AST 65 (H) 04/04/2021   NA 140 04/04/2021   K 4.0 04/04/2021   CL 108 04/04/2021   CREATININE 2.72 (H) 04/04/2021   BUN 71 (H) 04/04/2021   CO2 21 (L) 04/04/2021   TSH 0.949 12/23/2015   INR 1.4 (H) 03/25/2021   HGBA1C 5.9 (H) 03/28/2021      PT/INR: No results for input(s): LABPROT, INR in the last 72 hours. Radiology: [facility] Chest Port 1 View  Result Date: 04/03/2021 CLINICAL DATA:  PICC line repositioned. EXAM: PORTABLE CHEST 1 VIEW COMPARISON:  Earlier today. FINDINGS: The right PICC has been retracted with its tip approximally 2.5 cm proximal to the superior cavoatrial junction. Endotracheal tube in satisfactory position. Nasogastric tube extending into the stomach.  Stable post CABG changes and right axillary surgical clips. Interval minimal left basilar atelectasis and stable minimal right basilar atelectasis. IMPRESSION: 1. Repositioned PICC tube with its tip 2.5 cm proximal to the superior cavoatrial junction. 2. Minimal bibasilar atelectasis. 3. Stable cardiomegaly. Electronically Signed   By: Claudie Revering M.D.   On: 04/03/2021 11:32   DG Chest Port 1 View  Result Date: 04/03/2021 CLINICAL DATA:  60 year old female with a history recent PICC, repair of thoracic aortic dissection EXAM: PORTABLE CHEST 1 VIEW COMPARISON:  04/02/2021 FINDINGS: Surgical changes of median sternotomy, for repair of acute dissection. Endotracheal tube terminates suitably above the carina. Gastric tube projects over the mediastinum and terminates out of the field of view. Interval placement of right upper extremity PICC with the tip appearing to terminate in the upper right atrium. No pneumothorax. Low lung volumes with asymmetric elevation the right hemidiaphragm. No new confluent airspace disease or interlobular septal thickening. Crowding of the central vasculature given the low lung volumes. IMPRESSION: Interval placement of right upper extremity PICC, with the tip appearing to terminate in the upper right atrium. Unchanged endotracheal tube and gastric tube, with redemonstration of surgical changes of median sternotomy and repair of aortic dissection Electronically Signed   By: Corrie Mckusick D.O.   On: 04/03/2021 09:39     Assessment/Plan: S/P Procedure(s) (LRB): EXPLORATORY LAPAROTOMY (N/A) EXTENDED COLON RESECTION ILEOSTOMY (N/A) HERNIA REPAIR VENTRAL ADULT LYSIS OF ADHESION, EXTENSIVE (N/A) CV-SB at times. SR with HIR in the 70's this am. On  Pulmonary-on vent (FiO2% 40). Pulmonary/CCM following. Check CXR in am. GI-NPO, TPN. Drains with 220 cc las 24 hours. Per general surgery CBGs 465/181/184. On Insulin. Pre op HGA1C 5.9.  Expected blood loss anemia-H and H this am  decreased to 7.8 and 23.4. Monitor need for transfusion. History of CKD (stage III)-creatinine this am increased from 2.49 to 2.72. Avoid nephrotoxins (especially ACE,ARB) 7. ID-on Diflucan, Vancomycin and Zosyn for intra abdominal infection    Nani Skillern PA-C 04/04/2021 7:29 AM    Chart reviewed, patient examined, agree with above. Extubated today. Creat rising as expected but still making urine.

## 2021-04-04 NOTE — Progress Notes (Signed)
Patient ID: Dorothy Harris, female   DOB: [birth year], 60 y.o.   MRN: [id number] [facility] Evening Rounds:  Hemodynamically stable  Went into atrial fib with RVR today and started on IV amio. Still AF 100-120.  She is awake and alert this pm. No complaints. Sats 100% on 4L [place]. UO ok.

## 2021-04-04 NOTE — Progress Notes (Signed)
NAME:  Dorothy Harris, MRN:  [id number], DOB:  [birth year], LOS: 11 ADMISSION DATE:  03/24/2021, CONSULTATION DATE:  12/19 REFERRING MD:  Dr. Kris Mouton REASON FOR CONSULTATION: postanesthesia care  History of Present Illness:  This is a 60 year old female presented to [facility] ER 12/18 w/chest pain and SOB. 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 Started on esmolol, seen by CVTS; went to OR where she underwent repair and replacement of ascending aorta and replacement of aortic arch w/Hemashield graft. Returned to surgical ICU post op on 12/19. PCCM asked to assist w/ICU care.  PCCM signed off on 12/25.  Subsequently, the patient developed abdominal pain and returned to the operating theater on 12/27 after identification of abdominal free air on CT imaging.  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  Extended right hemicolectomy and ileostomy creation performed.  Case was discussed with Dr. Bonita Quin.  Spillage/soilage into the abdomen was reported.  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  Epigastric hernia   Significant Hospital Events: Including procedures, antibiotic start and stop dates in addition to other pertinent events   12/18 admitted w/ type A dissection. Underwent: Median Sternotomy Anastomosis of 8 mm Hemashield graft to the right axillary artery. Extracorporeal circulation via the right axillary artery and right atrial appendage Replacement of aortic arch under deep hypothermic circulatory arrest with bypass of the innominate, left common carotid and left subclavian arteries using a 30 x 10 x 8 x 8 x 10 mm Hemashield Platinum graft.  Supra-coronary replacement of the ascending aorta. Cross  clamp time of 169 minutes. Pt was noted to be coagulopathic intra-op  12/22 remains on cardene for hypertension.  12/24 finally off Cardene been complaining of abdominal pain.  General surgery evaluated her for a large abdominal hernia.  Will manage conservatively for now. 12/27 abdominal free air on CT.  Returned to the OR for extended right hemicolectomy and ileostomy creation.  Returned to 7E22 on mechanical ventilatory support.  PCCM reconsulted for postanesthesia care.  Interim History / Subjective:  Doing poorly on SBT but wide awake. Not making much urine.  Objective   Blood pressure 104/62, pulse 72, temperature 98.3 F (36.8 C), temperature source Axillary, resp. rate (!) 24, height 5\' 7"  (1.702 m), weight 98.2 kg, SpO2 95 %.    Vent Mode: Stand-by FiO2 (%):  [40 %] 40 % Set Rate:  [16 bmp] 16 bmp Vt Set:  [500 mL] 500 mL PEEP:  [5 cmH20] 5 cmH20 Pressure Support:  [10 cmH20] 10 cmH20 Plateau Pressure:  [18 cmH20-196 cmH20] 23 cmH20   Intake/Output Summary (Last 24 hours) at 04/04/2021 1019 Last data filed at 04/04/2021 0940 Gross per 24 hour  Intake 1236.08 ml  Output 1090 ml  Net 146.08 ml    Filed Weights   04/02/21 0500 04/03/21 0500 04/04/21 0500  Weight: 95.8 kg 97.1 kg 98.2 kg    Examination: No distress on vent ETT with minimal secretions JP/drains/incision sites look stable Ext without edema Small dark urine Moves all 4 ext to command   Assessment & Plan:  Principal Problem:   Status post surgery Active Problems:   S/P ascending aortic replacement   Encounter for weaning from ventilator [facility])   Encounter for postanesthesia  care   Peritonitis ([place])   Endotracheally intubated   Acute renal failure superimposed on stage 3a chronic kidney disease (HCC)   Malnutrition of moderate degree  Peritonitis secondary to incarcerated colon and perforated cecal volvulus and ischemic colon s/p extended R 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 Post-operative  respiratory failure requiring mechanical ventilation- no immediate OR plans per CCS, okay to wean from vent AKI on CKD- a little worse today AOC anemia- suspect related to blood loss from surgery s/p AAA repair w/ replacement of ascending aorta and replacement of aortic arch w/ Hemashield graft 03/24/21 H/o poorly controlled HTN with LVH  - TPN - Albumin/blood/LR as ordered, monitor UOP, avoid nephrotoxins, unfortunately high risk to progress to needing HD regardless of approach - PR aspirin - Abx, drain management per CCS - Does not seem to do well with SBT, going to try to just pull ETT in case this is the problem as she had similar issue with last extubation - Will follow with you   Best Practice (right click and "Reselect all SmartList Selections" daily)   Diet/type: TPN DVT prophylaxis: Subcutaneous heparin GI prophylaxis: Protonix Lines: PICC Foley: Foley Code Status:  full code Last date of multidisciplinary goals of care discussion: per primary   Patient critically ill due to respiratory failure Interventions to address this today vent titration Risk of deterioration without these interventions is high  I personally spent 35 minutes providing critical care not including any separately billable procedures  Erskine Emery MD [facility]  Prefer epic messenger for cross cover needs If after hours, please call E-link

## 2021-04-04 NOTE — Progress Notes (Signed)
PHARMACY - TOTAL PARENTERAL NUTRITION CONSULT NOTE   Indication: Prolonged ileus    Patient Measurements: Height: 5\' 7"  (170.2 cm) Weight: 98.2 kg (216 lb 7.9 oz) IBW/kg (Calculated) : 61.6 TPN AdjBW (KG): 66 Body mass index is 33.91 kg/m. 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 Patient was then found to have a chronically incarcerated hernia with likely ileus now s/p exploratory laparotomy, adhesiolysis x23min, extended right hemicolectomy, primary incisional hernia repair, and ileostomy creation 12/27. Patient has had very minimal PO intake since 12/19 surgery and anticipate cannot start tube feeds for some time after abdominal surgery. Surgery anticipates post-op ileus. Patient is at risk for refeeding. Pharmacy consulted for TPN.  Glucose / Insulin: BG < 200 since TPN, A1C 5.9, no hx DM, used 6 units insulin since TPN  Electrolytes: CO2 21 up, CoCa 9.5, others wnl  Renal: Scr 2.72 up (BL ~2), BUN 71 up Hepatic: AST/ALT 60s, Tbili 3.2 down, Albumin 1.6, TG 264 on propofol > paused 12/28 AM  Intake / Output; MIVF:  D5LR 172ml, UOP 0.4 ml/kg/hr, drains 251ml, net + 4L  GI Imaging: 12/18 CT abd: no findings 12/24 KUB: Gas within nondistended small bowel and mildly prominent right colon 12/25 KUB: gas in small bowel loops may suggest ileus 12/27 KUB: Increased gaseous distention of the right colon which could be due to postoperative ileus or colonic obstruction  12/27 CT abd: Large amount of free intraperitoneal air compatible with a bowel perforation; dilatation of the ascending colon and cecum indicating mechanical obstruction; descending colon is relatively decompressed indicating complete or high-grade partial obstruction  GI Surgeries / Procedures:  12/27:  exploratory  laparotomy, adhesiolysis x23min, extended right hemicolectomy, primary incisional hernia repair, and ileostomy creation  Central access: PICC 12/27 TPN start date: 12/28   Nutritional Goals: Goal TPN rate is 90 mL/hr (provides 60 g of protein, 259g dextrose, 60g lipids, and 2012 kcals per day)  RD Assessment: Estimated Needs Total Energy Estimated Needs: 1900-2100 kcals Total Protein Estimated Needs: 130-155 g Total Fluid Estimated Needs: >/= 1.9 L   Current Nutrition:  NPO and TPN Off propofol    Plan:  Increase TPN to 70 mL/hr at 1800, provides 102 g protein, 202 g dextrose, 47 g lipids, and 1565 Kcal, meeting ~78% of needs Electrolytes in TPN: (adjusted with rate increase) Increase Na 100 mEq/L, Ca 5 mEq/L, Mg 3 mEq/L, and Phos 8 mmol/L.; Continue K 25 mEq/L, Cl:Ac 1:2 Add standard MVI and trace elements to TPN Increase sSSI to Q4 hr and adjust as needed  LR at 10 ml/hr TKO  Monitor TPN labs daily until stable at goal then on Mon/Thurs Watch TG on TPN given elevated on propofol    Benetta Spar, PharmD, BCPS, [facility] Clinical Pharmacist  Please check AMION for all [place] phone numbers After 10:00 PM, call [place]

## 2021-04-04 NOTE — Progress Notes (Signed)
2 Days Post-Op   Subjective/Chief Complaint: Remains intubated   Objective: Vital signs in last 24 hours: Temp:  [98.1 F (36.7 C)-99.3 F (37.4 C)] 98.1 F (36.7 C) (12/29 0403) Pulse Rate:  [57-89] 59 (12/29 0700) Resp:  [15-31] 16 (12/29 0700) BP: (93-144)/(55-78) 97/56 (12/29 0400) SpO2:  [94 %-100 %] 100 % (12/29 0700) Arterial Line BP: (109-170)/(50-70) 115/51 (12/29 0700) FiO2 (%):  [40 %] 40 % (12/29 0400) Weight:  [98.2 kg] 98.2 kg (12/29 0500) Last BM Date:  (pta)  Intake/Output from previous day: 12/28 0701 - 12/29 0700 In: 1327.3 [I.V.:1067; IV Piggyback:260.2] Out: 1095 [Urine:875; Drains:220] Intake/Output this shift: No intake/output data recorded.  Exam: On vent, responsive Abdomen soft, vac in place, drains serosang Ostomy pink, working well  Lab Results:  Recent Labs    04/03/21 1133 04/04/21 0234  WBC 18.7* 13.3*  HGB 9.2* 7.8*  HCT 28.0* 23.4*  PLT 322 267   BMET Recent Labs    04/03/21 0113 04/04/21 0234  NA 137 140  K 3.9 4.0  CL 108 108  CO2 18* 21*  GLUCOSE 129* 193*  BUN 66* 71*  CREATININE 2.49* 2.72*  CALCIUM 7.7* 7.6*   PT/INR No results for input(s): LABPROT, INR in the last 72 hours. ABG Recent Labs    04/02/21 1909  PHART 7.379  HCO3 20.7    Studies/Results: CT ABDOMEN PELVIS WO CONTRAST  Result Date: 04/02/2021 CLINICAL DATA:  Hernia, complicated. EXAM: CT ABDOMEN AND PELVIS WITHOUT CONTRAST TECHNIQUE: Multidetector CT imaging of the abdomen and pelvis was performed following the standard protocol without IV contrast. COMPARISON:  CT abdomen and pelvis dated 03/24/2021. FINDINGS: Lower chest: New bibasilar consolidations, likely atelectasis. Surgical changes suggestive of ascending aorta replacement. Aneurysmal dilatation of the descending thoracic aorta, similar in caliber and configuration to earlier CT of 03/24/2021, further evaluation limited by the lack of intravascular contrast on today's study.  Hepatobiliary: No acute-appearing abnormality within the liver. Stable appearance of the RIGHT hepatic cyst which measures approximally 5 cm greatest dimension. Vicarious excretion of contrast noted within the otherwise normal-appearing gallbladder. No bile duct dilatation is seen. Pancreas: Unremarkable. No pancreatic ductal dilatation or surrounding inflammatory changes. Spleen: Normal in size without focal abnormality. Adrenals/Urinary Tract: Bilateral adrenal fullness, but otherwise unremarkable. Kidneys appear normal without stone or hydronephrosis. Bladder appears normal. Stomach/Bowel: Midline abdominal wall hernia, multicompartmental, containing separate segments of the transverse colon. There is now a dilatation of the ascending colon and cecum (proximal to the hernia site) with associated air-fluid levels, indicating mechanical obstruction at the hernia site. The descending colon is relatively decompressed indicating complete or high-grade partial obstruction. No associated small bowel dilatation at present. Vascular/Lymphatic: Abdominal aortic aneurysm, stable in the short-term interval, 4.6 cm in maximum diameter (previously 3.9 cm on CT of 06/2017). Further characterization limited by the lack of intravascular contrast. Reproductive: Uterus and bilateral adnexa are unremarkable. Other: Small amount of free fluid in the pelvis. Large amount of free intraperitoneal air within the upper abdomen. Additional extraluminal air within the hernia sac. Findings are compatible with a bowel perforation, likely involving the transverse colon at or immediately adjacent to the hernia sac. Musculoskeletal: No acute-appearing osseous abnormality. Degenerative spondylosis of the thoracolumbar spine, mild in degree. Large amount of apparent soft tissue edema versus soft tissue hematoma is now seen along the LEFT lateral chest wall, incompletely imaged. IMPRESSION: 1. Large amount of free intraperitoneal air within the  upper abdomen, with additional extraluminal air within the hernia sac. Findings  are compatible with a bowel perforation, likely involving the transverse colon at or immediately adjacent to the hernia sac. 2. Midline abdominal wall hernia, multicompartmental, containing separate segments of the transverse colon. There is now a dilatation of the ascending colon and cecum (proximal to the hernia site) with associated air-fluid levels, indicating mechanical obstruction at the hernia site. The descending colon is relatively decompressed indicating complete or high-grade partial obstruction. 3. Abdominal aortic aneurysm, stable in the short-term interval, 4.6 cm in maximum diameter (previously 3.9 cm on CT of 06/2017). Further characterization limited by the lack of intravascular contrast on today's study. 4. New bibasilar consolidations, likely atelectasis. 5. Large amount of apparent soft tissue edema versus soft tissue hematoma is now seen along the LEFT lateral chest wall, incompletely imaged. Clinical correlation recommended. Critical Value/emergent results were called by telephone at the time of interpretation on 04/02/2021 at 4:40 pm to provider [facility] , who verbally acknowledged these results. Electronically Signed   By: Franki Cabot M.D.   On: 04/02/2021 16:41   DG Chest 2 View  Result Date: 04/02/2021 CLINICAL DATA:  Effusions.  Distended abdomen. EXAM: CHEST - 2 VIEW COMPARISON:  Two-view chest x-ray 12/24/2 FINDINGS: Heart is enlarged. Right IJ sheath was removed. Left pleural effusion remains. Mild bibasilar atelectasis is present. No significant edema is present. No significant airspace consolidation IMPRESSION: Persistent left pleural effusion and bibasilar atelectasis. Electronically Signed   By: San Morelle M.D.   On: 04/02/2021 09:48   DG Abd 1 View  Result Date: 04/02/2021 CLINICAL DATA:  Pleural effusion, distended abdomen. EXAM: ABDOMEN - 1 VIEW COMPARISON:  Radiographs  03/31/2021, 03/30/2021 and 06/03/2017. CT 03/24/2021. FINDINGS: 0936 hours. Two supine views are submitted. Progressive increased gaseous distention of the right colon which could relate to colonic obstruction within the ventral hernia containing transverse colon on recent CT. There is gas throughout nondistended small bowel and distal colon. No supine evidence of free intraperitoneal air. Mild bibasilar atelectasis and a probable small right pleural effusion are noted. There are degenerative changes within the spine. IMPRESSION: Increased gaseous distention of the right colon which could be due to postoperative ileus or colonic obstruction within the known ventral hernia. Electronically Signed   By: Richardean Sale M.D.   On: 04/02/2021 09:53   DG Chest Port 1 View  Result Date: 04/03/2021 CLINICAL DATA:  PICC line repositioned. EXAM: PORTABLE CHEST 1 VIEW COMPARISON:  Earlier today. FINDINGS: The right PICC has been retracted with its tip approximally 2.5 cm proximal to the superior cavoatrial junction. Endotracheal tube in satisfactory position. Nasogastric tube extending into the stomach. Stable post CABG changes and right axillary surgical clips. Interval minimal left basilar atelectasis and stable minimal right basilar atelectasis. IMPRESSION: 1. Repositioned PICC tube with its tip 2.5 cm proximal to the superior cavoatrial junction. 2. Minimal bibasilar atelectasis. 3. Stable cardiomegaly. Electronically Signed   By: Claudie Revering M.D.   On: 04/03/2021 11:32   DG Chest Port 1 View  Result Date: 04/03/2021 CLINICAL DATA:  60 year old female with a history recent PICC, repair of thoracic aortic dissection EXAM: PORTABLE CHEST 1 VIEW COMPARISON:  04/02/2021 FINDINGS: Surgical changes of median sternotomy, for repair of acute dissection. Endotracheal tube terminates suitably above the carina. Gastric tube projects over the mediastinum and terminates out of the field of view. Interval placement of right  upper extremity PICC with the tip appearing to terminate in the upper right atrium. No pneumothorax. Low lung volumes with asymmetric elevation the right hemidiaphragm.  No new confluent airspace disease or interlobular septal thickening. Crowding of the central vasculature given the low lung volumes. IMPRESSION: Interval placement of right upper extremity PICC, with the tip appearing to terminate in the upper right atrium. Unchanged endotracheal tube and gastric tube, with redemonstration of surgical changes of median sternotomy and repair of aortic dissection Electronically Signed   By: Corrie Mckusick D.O.   On: 04/03/2021 09:39   DG CHEST PORT 1 VIEW  Result Date: 04/02/2021 CLINICAL DATA:  NG tube placement and intubation EXAM: PORTABLE CHEST 1 VIEW COMPARISON:  04/02/2021 FINDINGS: Postoperative changes in the mediastinum. Interval placement of an endotracheal tube with tip measuring 4.5 cm above the carina. An enteric tube is present. Tip is off the field of view but below the left hemidiaphragm. Shallow inspiration. Lungs are clear. Mild cardiac enlargement. Dilated aortic arch and descending aorta. Surgical clips in the right axilla. IMPRESSION: Appliances appear in satisfactory location. Cardiac enlargement. Dilated aorta. Lungs are clear. Electronically Signed   By: Lucienne Capers M.D.   On: 04/02/2021 22:01   [place] EKG SITE RITE  Result Date: 04/02/2021 If Site Rite image not attached, placement could not be confirmed due to current cardiac rhythm.   Anti-infectives: Anti-infectives (From admission, onward)    Start     Dose/Rate Route Frequency Ordered Stop   04/04/21 1800  vancomycin (VANCOREADY) IVPB 1250 mg/250 mL        1,250 mg 166.7 mL/hr over 90 Minutes Intravenous Every 48 hours 04/03/21 1501     04/03/21 0600  piperacillin-tazobactam (ZOSYN) IVPB 3.375 g        3.375 g 12.5 mL/hr over 240 Minutes Intravenous On call to O.R. 04/02/21 1603 04/03/21 0916   04/03/21 0200   piperacillin-tazobactam (ZOSYN) IVPB 3.375 g        3.375 g 12.5 mL/hr over 240 Minutes Intravenous Every 8 hours 04/02/21 2143     04/02/21 2245  fluconazole (DIFLUCAN) IVPB 200 mg        200 mg 100 mL/hr over 60 Minutes Intravenous Every 24 hours 04/02/21 2147     04/02/21 2245  vancomycin (VANCOREADY) IVPB 2000 mg/400 mL        2,000 mg 200 mL/hr over 120 Minutes Intravenous  Once 04/02/21 2158 04/03/21 0142   04/02/21 2158  vancomycin variable dose per unstable renal function (pharmacist dosing)  Status:  Discontinued         Does not apply See admin instructions 04/02/21 2159 04/03/21 1501   03/25/21 1200  vancomycin (VANCOCIN) IVPB 1000 mg/200 mL premix        1,000 mg 200 mL/hr over 60 Minutes Intravenous  Once 03/25/21 0512 03/25/21 1206   03/25/21 0600  ceFAZolin (ANCEF) IVPB 2g/100 mL premix        2 g 200 mL/hr over 30 Minutes Intravenous Every 8 hours 03/25/21 0512 03/26/21 2217   03/24/21 1800  ceFAZolin (ANCEF) IVPB 2g/100 mL premix        2 g 200 mL/hr over 30 Minutes Intravenous To Surgery 03/24/21 1738 03/25/21 0308   03/24/21 1800  ceFAZolin (ANCEF) IVPB 2g/100 mL premix  Status:  Discontinued        2 g 200 mL/hr over 30 Minutes Intravenous To Surgery 03/24/21 1734 03/25/21 0512   03/24/21 1800  vancomycin (VANCOREADY) IVPB 1250 mg/250 mL        1,250 mg 166.7 mL/hr over 90 Minutes Intravenous To Surgery 03/24/21 1738 03/24/21 2114       Assessment/Plan:  Chronically incarcerated ventral hernia Bowel perforation -POD #2 s/p ex lap, adhesiolysis, extended R hemicolectomy, primary incisional hernia repair, ileostomy creation, abdominal washout, drain placement x2, incisional wound vac placement 30x8x4cm - Dr. Bobbye Morton 04/02/21   Creatinine increasing.  Continue resuscitation Hgb down.  May require transfusion today Continue IV antibiotics and wound care If unable to extubate soon, will start tube feeds  Coralie Keens MD 04/04/2021

## 2021-04-04 NOTE — Progress Notes (Signed)
°  Inpatient Rehab Admissions Coordinator :  Per therapy recommendations, patient was screened for CIR candidacy by Danne Baxter RN MSN.  At this time patient appears to be a potential candidate for CIR. if she has caregiver supports at home. If caregivers are verified, then please place a rehab consult for full assessment. for potential rehab/CIR admit. Please call me with any questions.  Danne Baxter RN MSN Admissions Coordinator [phone]

## 2021-04-04 NOTE — Procedures (Signed)
Extubation Procedure Note  Patient Details:   Name: Dorothy Harris DOB: 1960/04/15 MRN: [id number]   Airway Documentation:    Vent end date: 04/04/21 Vent end time: 0959   Evaluation  O2 sats: stable throughout Complications: No apparent complications Patient did tolerate procedure well. Bilateral Breath Sounds: Rhonchi   Yes  [name] 04/04/2021, 10:00 AM

## 2021-04-05 ENCOUNTER — Inpatient Hospital Stay (HOSPITAL_COMMUNITY): Payer: PRIVATE HEALTH INSURANCE

## 2021-04-05 LAB — SURGICAL PATHOLOGY

## 2021-04-05 LAB — POCT I-STAT 7, (LYTES, BLD GAS, ICA,H+H)
Acid-base deficit: 4 mmol/L — ABNORMAL HIGH (ref 0.0–2.0)
Bicarbonate: 21.9 mmol/L (ref 20.0–28.0)
Calcium, Ion: 1.22 mmol/L (ref 1.15–1.40)
HCT: 26 % — ABNORMAL LOW (ref 36.0–46.0)
Hemoglobin: 8.8 g/dL — ABNORMAL LOW (ref 12.0–15.0)
O2 Saturation: 92 %
Patient temperature: 98.5
Potassium: 3.8 mmol/L (ref 3.5–5.1)
Sodium: 143 mmol/L (ref 135–145)
TCO2: 23 mmol/L (ref 22–32)
pCO2 arterial: 41.8 mmHg (ref 32.0–48.0)
pH, Arterial: 7.327 — ABNORMAL LOW (ref 7.350–7.450)
pO2, Arterial: 68 mmHg — ABNORMAL LOW (ref 83.0–108.0)

## 2021-04-05 LAB — GLUCOSE, CAPILLARY
Glucose-Capillary: 180 mg/dL — ABNORMAL HIGH (ref 70–99)
Glucose-Capillary: 199 mg/dL — ABNORMAL HIGH (ref 70–99)
Glucose-Capillary: 208 mg/dL — ABNORMAL HIGH (ref 70–99)
Glucose-Capillary: 208 mg/dL — ABNORMAL HIGH (ref 70–99)
Glucose-Capillary: 185 mg/dL — ABNORMAL HIGH (ref 70–99)
Glucose-Capillary: 223 mg/dL — ABNORMAL HIGH (ref 70–99)

## 2021-04-05 LAB — BPAM RBC
Blood Product Expiration Date: 202301112359
ISSUE DATE / TIME: 202212291121
Unit Type and Rh: 6200

## 2021-04-05 LAB — TYPE AND SCREEN
ABO/RH(D): A POS
Antibody Screen: NEGATIVE
Unit division: 0

## 2021-04-05 LAB — CBC
HCT: 24.8 % — ABNORMAL LOW (ref 36.0–46.0)
Hemoglobin: 8.3 g/dL — ABNORMAL LOW (ref 12.0–15.0)
MCH: 31.7 pg (ref 26.0–34.0)
MCHC: 33.5 g/dL (ref 30.0–36.0)
MCV: 94.7 fL (ref 80.0–100.0)
Platelets: 255 10*3/uL (ref 150–400)
RBC: 2.62 MIL/uL — ABNORMAL LOW (ref 3.87–5.11)
RDW: 20.2 % — ABNORMAL HIGH (ref 11.5–15.5)
WBC: 17.2 10*3/uL — ABNORMAL HIGH (ref 4.0–10.5)
nRBC: 0.2 % (ref 0.0–0.2)

## 2021-04-05 LAB — PHOSPHORUS: Phosphorus: 3.4 mg/dL (ref 2.5–4.6)

## 2021-04-05 LAB — BASIC METABOLIC PANEL
Anion gap: 10 (ref 5–15)
BUN: 69 mg/dL — ABNORMAL HIGH (ref 6–20)
CO2: 21 mmol/L — ABNORMAL LOW (ref 22–32)
Calcium: 8.2 mg/dL — ABNORMAL LOW (ref 8.9–10.3)
Chloride: 111 mmol/L (ref 98–111)
Creatinine, Ser: 2.46 mg/dL — ABNORMAL HIGH (ref 0.44–1.00)
GFR, Estimated: 22 mL/min — ABNORMAL LOW (ref >60)
Glucose, Bld: 186 mg/dL — ABNORMAL HIGH (ref 70–99)
Potassium: 3.5 mmol/L (ref 3.5–5.1)
Sodium: 142 mmol/L (ref 135–145)

## 2021-04-05 LAB — MAGNESIUM: Magnesium: 2.5 mg/dL — ABNORMAL HIGH (ref 1.7–2.4)

## 2021-04-05 LAB — TRIGLYCERIDES: Triglycerides: 351 mg/dL — ABNORMAL HIGH (ref <150)

## 2021-04-05 MED ORDER — PANTOPRAZOLE SODIUM 40 MG PO TBEC
40.0000 mg | DELAYED_RELEASE_TABLET | Freq: Every day | ORAL | Status: DC
  Administered 2021-04-05 – 2021-04-15 (×11): 40 mg via ORAL
  Filled 2021-04-05 (×11): qty 1

## 2021-04-05 MED ORDER — AMLODIPINE BESYLATE 5 MG PO TABS
5.0000 mg | ORAL_TABLET | Freq: Every day | ORAL | Status: DC
  Administered 2021-04-05: 11:00:00 5 mg via ORAL
  Filled 2021-04-05: qty 1

## 2021-04-05 MED ORDER — METOPROLOL TARTRATE 5 MG/5ML IV SOLN
2.5000 mg | Freq: Once | INTRAVENOUS | Status: AC
  Administered 2021-04-05: 08:00:00 2.5 mg via INTRAVENOUS
  Filled 2021-04-05: qty 5

## 2021-04-05 MED ORDER — POTASSIUM CHLORIDE 10 MEQ/50ML IV SOLN
10.0000 meq | INTRAVENOUS | Status: AC
  Administered 2021-04-05 (×2): 10 meq via INTRAVENOUS
  Filled 2021-04-05: qty 50

## 2021-04-05 MED ORDER — AMIODARONE LOAD VIA INFUSION
150.0000 mg | Freq: Once | INTRAVENOUS | Status: AC
  Administered 2021-04-05: 07:00:00 150 mg via INTRAVENOUS
  Filled 2021-04-05: qty 83.34

## 2021-04-05 MED ORDER — TRAVASOL 10 % IV SOLN
INTRAVENOUS | Status: AC
  Filled 2021-04-05: qty 1317.6

## 2021-04-05 MED ORDER — ASPIRIN 81 MG PO CHEW
324.0000 mg | CHEWABLE_TABLET | Freq: Every day | ORAL | Status: DC
  Administered 2021-04-05 – 2021-04-15 (×11): 324 mg via ORAL
  Filled 2021-04-05 (×12): qty 4

## 2021-04-05 MED ORDER — LACTATED RINGERS IV SOLN: INTRAVENOUS | Status: DC

## 2021-04-05 NOTE — Progress Notes (Signed)
PHARMACY - TOTAL PARENTERAL NUTRITION CONSULT NOTE   Indication: Prolonged ileus    Patient Measurements: Height: 5\' 7"  (170.2 cm) Weight: 100.1 kg (220 lb 10.9 oz) IBW/kg (Calculated) : 61.6 TPN AdjBW (KG): 66 Body mass index is 34.56 kg/m. 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 Patient was then found to have a chronically incarcerated hernia with likely ileus now s/p exploratory laparotomy, adhesiolysis x49min, extended right hemicolectomy, primary incisional hernia repair, and ileostomy creation 12/27. Patient has had very minimal PO intake since 12/19 surgery and anticipate cannot start tube feeds for some time after abdominal surgery. Surgery anticipates post-op ileus. Patient is at risk for refeeding. Pharmacy consulted for TPN.  Glucose / Insulin: BG 161-208, A1C 5.9, no hx DM, used 12 units insulin/24 hrs Electrolytes: K 3.5 > gave 20 IV > 3.8; CO2 21, Mg 2.5, others wnl  Renal: Scr 2.46 down (BL ~2), BUN 69   Hepatic: AST/ALT 60s, Tbili 3.2 down, Albumin 1.6 (then received 75g) , TG 351  Intake / Output; MIVF:  LR 1365 ml- at 75 ml/hr, UOP 0.7 ml/kg/hr, NGT 400 ml (now removed), drains 345 ml, ileostomy 235 ml, net + 5.8 L  GI Imaging: 12/18 CT abd: no findings 12/24 KUB: Gas within nondistended small bowel and mildly prominent right colon 12/25 KUB: gas in small bowel loops may suggest ileus 12/27 KUB: Increased gaseous distention of the right colon which could be due to postoperative ileus or colonic obstruction  12/27 CT abd: Large amount of free intraperitoneal air compatible with a bowel perforation; dilatation of the ascending colon and cecum indicating mechanical obstruction; descending colon is relatively decompressed indicating complete or high-grade partial  obstruction  GI Surgeries / Procedures:  12/27:  exploratory laparotomy, adhesiolysis x58min, extended right hemicolectomy, primary incisional hernia repair, and ileostomy creation  Central access: PICC 12/27 TPN start date: 12/28   Nutritional Goals: Goal TPN rate is 90 mL/hr (provides 60 g of protein, 259g dextrose, 60g lipids, and 2012 kcals per day)  RD Assessment: Estimated Needs Total Energy Estimated Needs: 1900-2100 kcals Total Protein Estimated Needs: 130-155 g Total Fluid Estimated Needs: >/= 1.9 L   Current Nutrition:  TPN 12/30 CLD   Plan:  Increase TPN to goal 90 mL/hr at 1800, provides 131 g protein, and 2012 Kcal, meeting ~100 % of needs Electrolytes in TPN: (adjusted with rate increase) Increase K 32 mEq/L, Phos 9 mmol/L, max acetate; Decrease Mg 0 mEq/L;  Continue Na 100 mEq/L, Ca 4 mEq/L  Add standard MVI and trace elements to TPN Increase sSSI to Q4 hr and adjust as needed  Add 10 units of insulin in TPN  Decrease LR to 55 ml/hr at 1800 given increasing TPN by 20 ml/hr; additional MIVF adjustments per team  Monitor TPN labs daily until stable at goal then on Mon/Thurs Watch elevated TG - decrease lipids in TPN if continues to rise  F/u PO intake   Benetta Spar, PharmD, BCPS, [facility] Clinical Pharmacist  Please check AMION for all [place] phone numbers After 10:00 PM, call [place]

## 2021-04-05 NOTE — Progress Notes (Signed)
Nutrition Follow-up  DOCUMENTATION CODES:   Non-severe (moderate) malnutrition in context of acute illness/injury  INTERVENTION:  - TPN to meet nutritional needs; TPN order per pharmacy  - Boost Breeze po TID, each supplement provides 250 kcal and 9 grams of protein  NUTRITION DIAGNOSIS:   Moderate Malnutrition related to acute illness (aortic disection repair, bowel perforation) as evidenced by mild muscle depletion, energy intake < or equal to 50% for > or equal to 5 days.  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 PMH includes HTN, hernia, smoker.  12/18 Admit, Repair of Type A dissection, median sternotomy 12/24 Abd pain with Large abd hernia 12/27 OR-ex lap, adhesiolysis, extended R hemicolectomy, prinary incisional hernia repair, ileostomy creation, abd washout, drain placement x 2, incisional wound vac placement 30x8x4cm 12/27 extubated 12/30 NGT removed  Plan for PICC line placement with initiation of TPN today Essentially no po intake sine admission. Recorded po intake 0-5%. NPO until 12/20 when diet advanced to CL, then advanced to Heart Healthy on 12/24 but made NPO again. Inadequate oral intake x 10 days. Pt now on clear liquid diet again. She reports tolerating jello and juices well today. Willing to receive [employer] with adjustment to nutrition supplements as diet advances.  Pt now started on TPN. Plans to increase TPN to goal 90 mL/hr at 1800, provides 131 g protein, and 2012 Kcal, meeting ~100 % of needs    Incisional Wound VAC in place-72mL  output, JP drains x 2-295 mL x24 hours   Current wt 100.1 kg. Admit weight 102 kg. Net + per I/O flow sheet, +edema.    Labs: potassium 3.8 (wdl), phosphorus 3.4 (wdl), magnesium 2.5 (H), Creatinine 2.46, BUN 69, CBGs 161-208 (H)-ICU goal 140-180 Meds: D5-LR at 55 ml/hr   Diet Order:   Diet Order             Diet clear liquid Room service appropriate? Yes; Fluid consistency: Thin  Diet effective now                   EDUCATION NEEDS:   Not appropriate for education at this time  Skin:  Skin Assessment: Skin Integrity Issues: Skin Integrity Issues:: Wound VAC, Other (Comment) Wound Vac: incisional-abdomen Incisions: chest, abdomen Other: ileostomy  Last BM:  12/30 via ileostomy  Height:   Ht Readings from Last 1 Encounters:  03/24/21 $RemoveB'5\' 7"'cTLnxnQY$  (1.702 m)    Weight:   Wt Readings from Last 1 Encounters:  04/05/21 100.1 kg    BMI:  Body mass index is 34.56 kg/m.  Estimated Nutritional Needs:   Kcal:  1900-2100 kcals  Protein:  120-135g  Fluid:  >/= 1.9 L  Clayborne Dana, RDN, LDN Clinical Nutrition

## 2021-04-05 NOTE — Progress Notes (Signed)
Pharmacy Antibiotic Note  Dorothy Harris is a 60 y.o. female admitted on 03/24/2021 initially with chest pain and repair of acute aortic dissection on 12/19, now presenting with free air on CT and  intra-abdominal infection . Patient now s/p exlap with extended colon resection, ileostomy, extensive lysis of adhesions, and repair of ventral hernia.  Pharmacy has been consulted for Zosyn, vancomycin and fluconazole dosing. Scr fairly stable 2.5   Est CrCl ~ 30 ml/min  Plan: Zosyn 3.375g IV q8h (4 hour infusion). Vancomycin 1250 mg IV q 48 hrs Estimated AUC 541 Fluconazole $RemoveBeforeD'200mg'[id number]$  q24h  Height: $Remove'5\' 7"'sYYiWPT$  (170.2 cm) Weight: 100.1 kg (220 lb 10.9 oz) IBW/kg (Calculated) : 61.6  Temp (24hrs), Avg:98.6 F (37 C), Min:98 F (36.7 C), Max:99.6 F (37.6 C)  Recent Labs  Lab 03/31/21 0218 03/31/21 1125 04/01/21 0400 04/02/21 0153 04/03/21 0113 04/03/21 1133 04/04/21 0234 04/05/21 0155  WBC 19.9*  --  20.6*  --   --  18.7* 13.3* 17.2*  CREATININE 2.46*  --  2.64* 2.59* 2.49*  --  2.72* 2.46*  LATICACIDVEN  --  0.8  --   --   --   --   --   --      Estimated Creatinine Clearance: 29.6 mL/min (A) (by C-G formula based on SCr of 2.46 mg/dL (H)).    No Known Allergies  Antimicrobials this admission: 12/27 Zosyn >>  12/27 Fluconazole >> 12/27 Vancomycin >>  Dose adjustments this admission:  Microbiology results: 12/27 MRSA PCR: negative    Bonnita Nasuti Pharm.D. CPP, BCPS Clinical Pharmacist [phone] 04/05/2021 11:02 AM   [facility] pharmacy phone numbers are listed on [email or web address]

## 2021-04-05 NOTE — Progress Notes (Addendum)
TCTS DAILY ICU PROGRESS NOTE                   [address]            [place] [address]          [phone]   3 Days Post-Op Procedure(s) (LRB): EXPLORATORY LAPAROTOMY (N/A) EXTENDED COLON RESECTION ILEOSTOMY (N/A) HERNIA REPAIR VENTRAL ADULT LYSIS OF ADHESION, EXTENSIVE (N/A)  Total Length of Stay:  LOS: 12 days   Subjective: Patient uncomfortable in the bed. She denies nausea or vomiting.  Objective: Vital signs in last 24 hours: Temp:  [98 F (36.7 C)-99.6 F (37.6 C)] 98.8 F (37.1 C) (12/30 0319) Pulse Rate:  [64-153] 110 (12/30 0600) Cardiac Rhythm: Atrial fibrillation (12/30 0100) Resp:  [16-39] 33 (12/30 0600) BP: (89-130)/(62-80) 126/67 (12/30 0400) SpO2:  [90 %-100 %] 99 % (12/30 0600) Arterial Line BP: (95-189)/(47-117) 135/66 (12/30 0600) FiO2 (%):  [40 %] 40 % (12/29 0900) Weight:  [100.1 kg] 100.1 kg (12/30 0500)  Filed Weights   04/03/21 0500 04/04/21 0500 04/05/21 0500  Weight: 97.1 kg 98.2 kg 100.1 kg    Weight change: 1.9 kg      Intake/Output from previous day: 12/29 0701 - 12/30 0700 In: 4477.2 [P.O.:200; I.V.:3152.4; Blood:315; NG/GT:180; IV Piggyback:629.9] Out: 2160 [Urine:1180; Emesis/NG output:400; Drains:345; Stool:235]  Intake/Output this shift: No intake/output data recorded.  Current Meds: Scheduled Meds:  aspirin  300 mg Rectal Daily   chlorhexidine  15 mL Mouth Rinse BID   Chlorhexidine Gluconate Cloth  6 each Topical Daily   heparin  5,000 Units Subcutaneous Q8H   insulin aspart  0-9 Units Subcutaneous Q4H   mouth rinse  15 mL Mouth Rinse q12n4p   pantoprazole (PROTONIX) IV  40 mg Intravenous QHS   sodium chloride flush  10-40 mL Intracatheter Q12H   Continuous Infusions:  albumin human Stopped (04/04/21 1854)   amiodarone 30 mg/hr (04/05/21 3818)   fluconazole (DIFLUCAN) IV Stopped (04/05/21 0034)   lactated ringers Stopped (04/04/21 1028)   lactated ringers 75 mL/hr at 04/05/21 0600    piperacillin-tazobactam (ZOSYN)  IV Stopped (04/05/21 0548)   potassium chloride 10 mEq (04/05/21 0641)   TPN ADULT (ION) 70 mL/hr at 04/05/21 0600   vancomycin Stopped (04/04/21 1934)   PRN Meds:.albuterol, hydrALAZINE, LORazepam, morphine injection, ondansetron (ZOFRAN) IV, sodium chloride flush  General appearance: cooperative and no distress Neurologic: Grossly intact without focal deficit Heart: IRRR IRRR Lungs: Diminished bibasilar breath sounds Abdomen: Soft, obese, , occasional bowel sounds Extremities: SCDs in place Wound: Median sternotomy and right axillary wounds are clean and dry. VAC in place on abdominal wound  Lab Results: CBC: Recent Labs    04/04/21 0234 04/05/21 0155  WBC 13.3* 17.2*  HGB 7.8* 8.3*  HCT 23.4* 24.8*  PLT 267 255    BMET:  Recent Labs    04/04/21 0234 04/05/21 0155  NA 140 142  K 4.0 3.5  CL 108 111  CO2 21* 21*  GLUCOSE 193* 186*  BUN 71* 69*  CREATININE 2.72* 2.46*  CALCIUM 7.6* 8.2*     CMET: Lab Results  Component Value Date   WBC 17.2 (H) 04/05/2021   HGB 8.3 (L) 04/05/2021   HCT 24.8 (L) 04/05/2021   PLT 255 04/05/2021   GLUCOSE 186 (H) 04/05/2021   CHOL 220 (H) 08/21/2017   TRIG 351 (H) 04/05/2021   HDL 50 (L) 08/21/2017   LDLCALC 133 (H) 08/21/2017   ALT 62 (H)  04/04/2021   AST 65 (H) 04/04/2021   NA 142 04/05/2021   K 3.5 04/05/2021   CL 111 04/05/2021   CREATININE 2.46 (H) 04/05/2021   BUN 69 (H) 04/05/2021   CO2 21 (L) 04/05/2021   TSH 0.949 12/23/2015   INR 1.4 (H) 03/25/2021   HGBA1C 5.9 (H) 03/28/2021      PT/INR: No results for input(s): LABPROT, INR in the last 72 hours. Radiology: No results found.   Assessment/Plan: S/P Procedure(s) (LRB): EXPLORATORY LAPAROTOMY (N/A) EXTENDED COLON RESECTION ILEOSTOMY (N/A) HERNIA REPAIR VENTRAL ADULT LYSIS OF ADHESION, EXTENSIVE (N/A) CV-She went into a fib with RVR yesterday. She is on an Amiodarone drip and has been given boluses. Will give  Lopressor IV this am. She is still in a fib with HR in the 120's. Pulmonary-Tachypnea with RR above 30. She is on 4 liters of oxygen with good oxygenation. CXR this am appears so show low lung volumes, cardiomegaly, widened mediastinum (repair dissection), bibasilar atelectasis. Pulmonary/CCM following. Encourage incentive spirometer and flutter valve.  GI-NPO, TPN at 70. NGT with 400 cc for 12 hours. Drains with 345 cc last 24 hours. Per general surgery CBGs 161/166/180. On Insulin PRN. Pre op HGA1C 5.9.  Expected blood loss anemia-H and H this am increased to 8.3 and 24.8. Had transfusion yesterday. History of CKD (stage III)-creatinine this am decreased from 2.72 to 2.46. Avoid nephrotoxins (especially ACE,ARB) 7. ID-WBC increased to 17,200 this am. She is on Diflucan, Vancomycin and Zosyn for intra abdominal infection 8. Gently supplement potassium   Donielle Liston Alba PA-C 04/05/2021 7:12 AM     Chart reviewed, patient examined, agree with above. May need additional boluses of amio for rapid rate. Continue IV drip 30. Add beta blocker as tolerated.

## 2021-04-05 NOTE — Progress Notes (Signed)
3 Days Post-Op   Subjective/Chief Complaint: Tolerating extubation Comfortable NG output down   Objective: Vital signs in last 24 hours: Temp:  [98 F (36.7 C)-99.6 F (37.6 C)] 98.8 F (37.1 C) (12/30 0319) Pulse Rate:  [64-153] 110 (12/30 0600) Resp:  [16-39] 33 (12/30 0600) BP: (89-130)/(62-80) 126/67 (12/30 0400) SpO2:  [90 %-100 %] 99 % (12/30 0600) Arterial Line BP: (95-189)/(47-117) 135/66 (12/30 0600) FiO2 (%):  [40 %] 40 % (12/29 0900) Weight:  [100.1 kg] 100.1 kg (12/30 0500) Last BM Date: 04/05/21  Intake/Output from previous day: 12/29 0701 - 12/30 0700 In: 4477.2 [P.O.:200; I.V.:3152.4; Blood:315; NG/GT:180; IV Piggyback:629.9] Out: 2160 [Urine:1180; Emesis/NG output:400; Drains:345; Stool:235] Intake/Output this shift: No intake/output data recorded.  Exam: Awake and alert Abdomen soft, obese, ostomy working well Drains serosang  Lab Results:  Recent Labs    04/04/21 0234 04/05/21 0155  WBC 13.3* 17.2*  HGB 7.8* 8.3*  HCT 23.4* 24.8*  PLT 267 255   BMET Recent Labs    04/04/21 0234 04/05/21 0155  NA 140 142  K 4.0 3.5  CL 108 111  CO2 21* 21*  GLUCOSE 193* 186*  BUN 71* 69*  CREATININE 2.72* 2.46*  CALCIUM 7.6* 8.2*   PT/INR No results for input(s): LABPROT, INR in the last 72 hours. ABG Recent Labs    04/02/21 1909  PHART 7.379  HCO3 20.7    Studies/Results: DG Chest Port 1 View  Result Date: 04/03/2021 CLINICAL DATA:  PICC line repositioned. EXAM: PORTABLE CHEST 1 VIEW COMPARISON:  Earlier today. FINDINGS: The right PICC has been retracted with its tip approximally 2.5 cm proximal to the superior cavoatrial junction. Endotracheal tube in satisfactory position. Nasogastric tube extending into the stomach. Stable post CABG changes and right axillary surgical clips. Interval minimal left basilar atelectasis and stable minimal right basilar atelectasis. IMPRESSION: 1. Repositioned PICC tube with its tip 2.5 cm proximal to the  superior cavoatrial junction. 2. Minimal bibasilar atelectasis. 3. Stable cardiomegaly. Electronically Signed   By: Claudie Revering M.D.   On: 04/03/2021 11:32   DG Chest Port 1 View  Result Date: 04/03/2021 CLINICAL DATA:  60 year old female with a history recent PICC, repair of thoracic aortic dissection EXAM: PORTABLE CHEST 1 VIEW COMPARISON:  04/02/2021 FINDINGS: Surgical changes of median sternotomy, for repair of acute dissection. Endotracheal tube terminates suitably above the carina. Gastric tube projects over the mediastinum and terminates out of the field of view. Interval placement of right upper extremity PICC with the tip appearing to terminate in the upper right atrium. No pneumothorax. Low lung volumes with asymmetric elevation the right hemidiaphragm. No new confluent airspace disease or interlobular septal thickening. Crowding of the central vasculature given the low lung volumes. IMPRESSION: Interval placement of right upper extremity PICC, with the tip appearing to terminate in the upper right atrium. Unchanged endotracheal tube and gastric tube, with redemonstration of surgical changes of median sternotomy and repair of aortic dissection Electronically Signed   By: Corrie Mckusick D.O.   On: 04/03/2021 09:39    Anti-infectives: Anti-infectives (From admission, onward)    Start     Dose/Rate Route Frequency Ordered Stop   04/04/21 1800  vancomycin (VANCOREADY) IVPB 1250 mg/250 mL        1,250 mg 166.7 mL/hr over 90 Minutes Intravenous Every 48 hours 04/03/21 1501     04/03/21 0600  piperacillin-tazobactam (ZOSYN) IVPB 3.375 g        3.375 g 12.5 mL/hr over 240 Minutes Intravenous On  call to O.R. 04/02/21 1603 04/03/21 0916   04/03/21 0200  piperacillin-tazobactam (ZOSYN) IVPB 3.375 g        3.375 g 12.5 mL/hr over 240 Minutes Intravenous Every 8 hours 04/02/21 2143     04/02/21 2245  fluconazole (DIFLUCAN) IVPB 200 mg        200 mg 100 mL/hr over 60 Minutes Intravenous Every 24  hours 04/02/21 2147     04/02/21 2245  vancomycin (VANCOREADY) IVPB 2000 mg/400 mL        2,000 mg 200 mL/hr over 120 Minutes Intravenous  Once 04/02/21 2158 04/03/21 0142   04/02/21 2158  vancomycin variable dose per unstable renal function (pharmacist dosing)  Status:  Discontinued         Does not apply See admin instructions 04/02/21 2159 04/03/21 1501   03/25/21 1200  vancomycin (VANCOCIN) IVPB 1000 mg/200 mL premix        1,000 mg 200 mL/hr over 60 Minutes Intravenous  Once 03/25/21 0512 03/25/21 1206   03/25/21 0600  ceFAZolin (ANCEF) IVPB 2g/100 mL premix        2 g 200 mL/hr over 30 Minutes Intravenous Every 8 hours 03/25/21 0512 03/26/21 2217   03/24/21 1800  ceFAZolin (ANCEF) IVPB 2g/100 mL premix        2 g 200 mL/hr over 30 Minutes Intravenous To Surgery 03/24/21 1738 03/25/21 0308   03/24/21 1800  ceFAZolin (ANCEF) IVPB 2g/100 mL premix  Status:  Discontinued        2 g 200 mL/hr over 30 Minutes Intravenous To Surgery 03/24/21 1734 03/25/21 0512   03/24/21 1800  vancomycin (VANCOREADY) IVPB 1250 mg/250 mL        1,250 mg 166.7 mL/hr over 90 Minutes Intravenous To Surgery 03/24/21 1738 03/24/21 2114       Assessment/Plan: Chronically incarcerated ventral hernia Bowel perforation -POD #3 s/p ex lap, adhesiolysis, extended R hemicolectomy, primary incisional hernia repair, ileostomy creation, abdominal washout, drain placement x2, incisional wound vac placement 30x8x4cm - Dr. Bobbye Morton 04/02/21  D/c NG  Clear liquid diet Continue wound vac   Coralie Keens MD 04/05/2021

## 2021-04-05 NOTE — Progress Notes (Addendum)
NAME:  Dorothy Harris, MRN:  [id number], DOB:  1960-09-15, LOS: 12 ADMISSION DATE:  03/24/2021, CONSULTATION DATE:  12/19 REFERRING MD:  Dr. Kris Mouton REASON FOR CONSULTATION: postanesthesia care  History of Present Illness:  This is a 60 year old female presented to [facility] ER 12/18 w/chest pain and SOB. 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 Started on esmolol, seen by CVTS; went to OR where she underwent repair and replacement of ascending aorta and replacement of aortic arch w/Hemashield graft. Returned to surgical ICU post op on 12/19. PCCM asked to assist w/ICU care.  PCCM signed off on 12/25.  Subsequently, the patient developed abdominal pain and returned to the operating theater on 12/27 after identification of abdominal free air on CT imaging.  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  Extended right hemicolectomy and ileostomy creation performed.  Case was discussed with Dr. Bonita Quin.  Spillage/soilage into the abdomen was reported.  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  Epigastric hernia   Significant Hospital Events: Including procedures, antibiotic start and stop dates in addition to other pertinent events   12/18 admitted w/ type A dissection. Underwent: Median Sternotomy Anastomosis of 8 mm Hemashield graft to the right axillary artery. Extracorporeal circulation via the right axillary artery and right atrial appendage Replacement of aortic arch under deep hypothermic circulatory arrest with bypass of the innominate, left common carotid and left subclavian arteries using a 30 x 10 x 8 x 8 x 10 mm Hemashield Platinum graft.  Supra-coronary replacement of the ascending aorta. Cross  clamp time of 169 minutes. Pt was noted to be coagulopathic intra-op  12/22 remains on cardene for hypertension.  12/24 finally off Cardene been complaining of abdominal pain.  General surgery evaluated her for a large abdominal hernia.  Will manage conservatively for now. 12/27 abdominal free air on CT.  Returned to the OR for extended right hemicolectomy and ileostomy creation.  Returned to 0N62 on mechanical ventilatory support.  PCCM reconsulted for postanesthesia care.  Interim History / Subjective:  Doing well, needs to work on secretion clearance. HD doing well, may actually need some antihypertensives.  Objective   Blood pressure (!) 160/80, pulse 70, temperature 98.5 F (36.9 C), temperature source Axillary, resp. rate (!) 29, height 5\' 7"  (1.702 m), weight 100.1 kg, SpO2 96 %.        Intake/Output Summary (Last 24 hours) at 04/05/2021 1023 Last data filed at 04/05/2021 0900 Gross per 24 hour  Intake 4779.12 ml  Output 2520 ml  Net 2259.12 ml    Filed Weights   04/03/21 0500 04/04/21 0500 04/05/21 0500  Weight: 97.1 kg 98.2 kg 100.1 kg    Examination: No distress Lungs with diminished breath sounds at bases, no accessory muscle use Abdomen vac and Jps in place with serosanguinous output Ostomy with minimal output Moves all 4 ext to command    Assessment & Plan:  Principal Problem:   Status post surgery Active Problems:   S/P ascending aortic replacement   Encounter for weaning from ventilator [facility])   Encounter for postanesthesia care   Peritonitis (HCC)   Endotracheally intubated   Acute renal failure superimposed on stage 3a chronic kidney disease (HCC)   Malnutrition of moderate degree  Peritonitis secondary to incarcerated colon and perforated cecal volvulus and ischemic colon s/p extended R 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 Post-operative hypoxemic respiratory failure requiring mechanical ventilation- doing okay off vent, biggest issue is going to be  fluid shifts, secretion clearance, and immobility; needs to work on IS/flutter AKI on CKD- improved with further volume yesterday AOC anemia- suspect related to blood loss from surgery s/p AAA repair w/ replacement of ascending aorta and replacement of aortic arch w/ Hemashield graft 03/24/21 H/o poorly controlled HTN with LVH  - TPN until reliably taking enough calories - Hold further volume - PR aspirin - Abx, drain, VAC management per CCS - Start amlodipine, consider beta blocker tomorrow - Flutter, IS, wean O2 for sats > 90% - Remove A line  Stable for transfer to progressive, appreciate TRH taking over starting 04/06/21.  TCTS and CCS will continue to follow.  Best Practice (right click and "Reselect all SmartList Selections" daily)   Diet/type: TPN, clears DVT prophylaxis: Subcutaneous heparin GI prophylaxis: Protonix Lines: PICC Foley: Foley Code Status:  full code Last date of multidisciplinary goals of care discussion: called and updated aunt over phone  Erskine Emery MD [facility] [place] epic messenger for cross cover needs If after hours, please call E-link

## 2021-04-05 NOTE — Consult Note (Signed)
[place] Nurse Consult Note: Patient receiving care in [facility] 2H18 Richard Miu, PA-C present for first dressing change today.  Reason for Consult: Vac dressing change Wound type: Midline surgical  Pressure Injury POA: NA Measurement: 23 cm x 6 cm x 3.5 cm with 4 cm undermining from 3 o'clock to 5 o'clock. 2 small wounds at 2 o'clock and 10 o'clock of unknown origin approx 1 cm in depth Wound bed: See photo taken today  Drainage (amount, consistency, odor) serosanguinous in canister Periwound: intact Dressing procedure/placement/frequency: 1 piece of black foam removed. 1 piece of black foam replaced with 2 small pieces of Aquacel over the 2 small wounds at the proximal end of the wound. Drape applied immediate suction obtained at 125 mmHg 2 dressing kits remain at the bedside.  WOC will follow MWF   WOC Nurse ostomy consult note Stoma type/location: RLQ ileostomy Stomal assessment/size: 1 3/8" pink moist above skin level Peristomal assessment: sutures intact Treatment options for stomal/peristomal skin: Barrier ring Output: thin green Ostomy pouching: 2pc. 2 3/4" pouch Kellie Simmering # [phone]) Skin Barrier Kellie Simmering # [phone]) Kizzie Bane G[place]man # [phone]) Education provided: None Enrolled patient in [place] program: No   Monitor the wound area(s) for worsening of condition such as: Signs/symptoms of infection, increase in size, development of or worsening of odor, development of pain, or increased pain at the affected locations.   Notify the medical team if any of these develop.  Thank you for the consult. WOC will follow MWF Please re-consult the [place] team if needed.  Cathlean Marseilles Tamala Julian, MSN, RN, [place], Lysle Pearl, [facility] Wound Treatment Associate Pager [phone]

## 2021-04-05 NOTE — Progress Notes (Signed)
Patient arrived to 4E from [place]. Vitals signs taken and stable. Tele placed and CCMD notified. Assessment completed and new dressings placed on JP drains. Wound Vac assessed and running continuous. Patient's sister at bedside. Patient oriented to unit. Call bell within reach.   [place] N Alfhild Partch

## 2021-04-06 ENCOUNTER — Inpatient Hospital Stay (HOSPITAL_COMMUNITY): Payer: PRIVATE HEALTH INSURANCE

## 2021-04-06 LAB — TRIGLYCERIDES: Triglycerides: 335 mg/dL — ABNORMAL HIGH (ref <150)

## 2021-04-06 LAB — GLUCOSE, CAPILLARY
Glucose-Capillary: 181 mg/dL — ABNORMAL HIGH (ref 70–99)
Glucose-Capillary: 182 mg/dL — ABNORMAL HIGH (ref 70–99)
Glucose-Capillary: 189 mg/dL — ABNORMAL HIGH (ref 70–99)
Glucose-Capillary: 194 mg/dL — ABNORMAL HIGH (ref 70–99)
Glucose-Capillary: 195 mg/dL — ABNORMAL HIGH (ref 70–99)
Glucose-Capillary: 206 mg/dL — ABNORMAL HIGH (ref 70–99)
Glucose-Capillary: 206 mg/dL — ABNORMAL HIGH (ref 70–99)

## 2021-04-06 LAB — BASIC METABOLIC PANEL
Anion gap: 12 (ref 5–15)
BUN: 63 mg/dL — ABNORMAL HIGH (ref 6–20)
CO2: 23 mmol/L (ref 22–32)
Calcium: 8.6 mg/dL — ABNORMAL LOW (ref 8.9–10.3)
Chloride: 109 mmol/L (ref 98–111)
Creatinine, Ser: 2.09 mg/dL — ABNORMAL HIGH (ref 0.44–1.00)
GFR, Estimated: 27 mL/min — ABNORMAL LOW (ref >60)
Glucose, Bld: 191 mg/dL — ABNORMAL HIGH (ref 70–99)
Potassium: 3.7 mmol/L (ref 3.5–5.1)
Sodium: 144 mmol/L (ref 135–145)

## 2021-04-06 LAB — CBC
HCT: 25.7 % — ABNORMAL LOW (ref 36.0–46.0)
Hemoglobin: 8.7 g/dL — ABNORMAL LOW (ref 12.0–15.0)
MCH: 31.9 pg (ref 26.0–34.0)
MCHC: 33.9 g/dL (ref 30.0–36.0)
MCV: 94.1 fL (ref 80.0–100.0)
Platelets: 265 10*3/uL (ref 150–400)
RBC: 2.73 MIL/uL — ABNORMAL LOW (ref 3.87–5.11)
RDW: 19.8 % — ABNORMAL HIGH (ref 11.5–15.5)
WBC: 20.5 10*3/uL — ABNORMAL HIGH (ref 4.0–10.5)
nRBC: 0.2 % (ref 0.0–0.2)

## 2021-04-06 LAB — MAGNESIUM: Magnesium: 2.2 mg/dL (ref 1.7–2.4)

## 2021-04-06 LAB — PHOSPHORUS: Phosphorus: 2.4 mg/dL — ABNORMAL LOW (ref 2.5–4.6)

## 2021-04-06 MED ORDER — INSULIN ASPART 100 UNIT/ML IJ SOLN
0.0000 [IU] | INTRAMUSCULAR | Status: DC
Start: 2021-04-06 — End: 2021-04-08
  Administered 2021-04-06: 8 [IU] via SUBCUTANEOUS
  Administered 2021-04-06 – 2021-04-07 (×4): 4 [IU] via SUBCUTANEOUS
  Administered 2021-04-07 (×2): 8 [IU] via SUBCUTANEOUS
  Administered 2021-04-07: 4 [IU] via SUBCUTANEOUS
  Administered 2021-04-07: 8 [IU] via SUBCUTANEOUS
  Administered 2021-04-08 (×2): 2 [IU] via SUBCUTANEOUS

## 2021-04-06 MED ORDER — FLUCONAZOLE 100 MG PO TABS
200.0000 mg | ORAL_TABLET | Freq: Every day | ORAL | Status: DC
  Administered 2021-04-06 – 2021-04-08 (×3): 200 mg via ORAL
  Filled 2021-04-06 (×3): qty 2

## 2021-04-06 MED ORDER — POTASSIUM PHOSPHATES 15 MMOLE/5ML IV SOLN
20.0000 mmol | Freq: Once | INTRAVENOUS | Status: AC
  Administered 2021-04-06: 20 mmol via INTRAVENOUS
  Filled 2021-04-06: qty 6.67

## 2021-04-06 MED ORDER — TRAVASOL 10 % IV SOLN
INTRAVENOUS | Status: AC
  Filled 2021-04-06: qty 1317.6

## 2021-04-06 MED ORDER — AMLODIPINE BESYLATE 10 MG PO TABS
10.0000 mg | ORAL_TABLET | Freq: Every day | ORAL | Status: DC
  Administered 2021-04-06 – 2021-04-10 (×5): 10 mg via ORAL
  Filled 2021-04-06 (×5): qty 1

## 2021-04-06 MED ORDER — LORAZEPAM 2 MG/ML IJ SOLN
0.5000 mg | Freq: Four times a day (QID) | INTRAMUSCULAR | Status: DC | PRN
  Administered 2021-04-06 – 2021-04-09 (×4): 0.5 mg via INTRAVENOUS
  Filled 2021-04-06 (×4): qty 1

## 2021-04-06 MED ORDER — METOPROLOL TARTRATE 5 MG/5ML IV SOLN
INTRAVENOUS | Status: AC
  Administered 2021-04-06: 5 mg
  Filled 2021-04-06: qty 5

## 2021-04-06 NOTE — Progress Notes (Signed)
PROGRESS NOTE    Dorothy Harris  YYQ:[id number] DOB: 11-Jul-1960 DOA: 03/24/2021 PCP: No primary care provider on file.    Brief Narrative:  Patient has history of uncontrolled hypertension, smoker, acute type B aortic dissection in 2019, aneurysmal dilatation of proximal descending aorta 2019 and lost follow-up presented to [facility], ER on 12/18 with chest pain and shortness of breath.  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  Return to surgical ICU postop, subsequently developed abdominal pain needing to go back to operating room on 12/27.  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  Extended right hemicolectomy and ileostomy. Transferred to medical floor 12/30 on amiodarone drip, TPN.   Assessment & Plan:   Principal Problem:   Status post surgery Active Problems:   S/P ascending aortic replacement   Encounter for weaning from ventilator [facility])   Encounter for postanesthesia care   Peritonitis ([place])   Endotracheally intubated   Acute renal failure superimposed on stage 3a chronic kidney disease ([place])   Malnutrition of moderate degree  Ascending aortic rupture: Replacement of ascending aorta and replacement of aortic arch with Hemashield graft 12/18.  Followed by vascular surgery.  Surgically stable as per surgery.  Peritonitis secondary to incarcerated colon and perforated cecal volvulus and ischemic colon: Status post extended right 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.  Followed by surgery.  Currently remains on TPN and trying clears. Patient is on Diflucan, vancomycin and Zosyn.  Continue antibiotics.  Followed by surgery.  Currently tolerating clears.  On TPN. WC count elevated today.  May need repeated scans.  Recheck tomorrow morning.  Acute kidney injury  on chronic kidney disease stage IIIa: Stabilizing.  Urine output is adequate.  Essential hypertension, poorly controlled: Started on amlodipine.  Blood pressures are acceptable today.  A. fib with RVR: Poorly controlled.  Currently on amiodarone drip.  Not anticoagulated.  Intraoperative echocardiogram 12/18 with ejection fraction more than 65%.    DVT prophylaxis: heparin injection 5,000 Units Start: 03/29/21 1400 SCDs Start: 03/27/21 0857   Code Status: Full code Family Communication: None Disposition Plan: Status is: Inpatient  Remains inpatient appropriate because: On IV infusion drips.  Actively managed by surgery.         Consultants:  CT surgery General surgery  Procedures:  Multiple procedures as above  Antimicrobials:  Vancomycin Zosyn   Subjective: Patient seen and examined.  She tells me that she is fine when she is completely resting, she feels out of breath on mobility or moving around in the bed.  Patient has mild to moderate pain and distention of abdomen.  She feels like her whole belly is bloated.  Denies any nausea or vomiting.  Tolerating clears. Telemetry shows A. fib with heart rate 110s-130s.  Patient denies any chest pain or palpitations.  Objective: Vitals:   04/05/21 2351 04/06/21 0225 04/06/21 0346 04/06/21 0720  BP: (!) 165/84 127/85 (!) 135/92 129/89  Pulse: 86 (!) 141 98 (!) 123  Resp: $Remo'20 20 20 18  'AbxwR$ Temp: 98.7 F (37.1 C)  98.6 F (37 C) 98.6 F (37 C)  TempSrc: Oral  Oral Oral  SpO2: 97% 91% 97% 96%  Weight:   105.2 kg   Height:        Intake/Output Summary (Last 24 hours) at 04/06/2021 8889 Last data filed at  04/06/2021 0713 Gross per 24 hour  Intake 4426.33 ml  Output 2220 ml  Net 2206.33 ml   Filed Weights   04/04/21 0500 04/05/21 0500 04/06/21 0346  Weight: 98.2 kg 100.1 kg 105.2 kg    Examination:  General exam: Appears sick but currently comfortable.  Frail and debilitated. Respiratory system: Mostly bilateral  clear.  Poor air entry at bases. SpO2: 99 % O2 Flow Rate (L/min): 4 L/min FiO2 (%): 40 %  Cardiovascular system: S1 & S2 heard, irregularly irregular.  Tachycardic. Gastrointestinal system: Distended.  Midline surgical incision with wound VAC in place.  She has right lower quadrant ileostomy with no stool.  JP drains with minimal serosanguineous fluid. Tense and distended.  No bowel sounds. Central nervous system: Alert and oriented. No focal neurological deficits. Extremities: Symmetric 5 x 5 power.   Data Reviewed: I have personally reviewed following labs and imaging studies  CBC: Recent Labs  Lab 04/01/21 0400 04/02/21 1909 04/03/21 1133 04/04/21 0234 04/05/21 0155 04/05/21 0843 04/06/21 0514  WBC 20.6*  --  18.7* 13.3* 17.2*  --  20.5*  HGB 9.2*   < > 9.2* 7.8* 8.3* 8.8* 8.7*  HCT 28.5*   < > 28.0* 23.4* 24.8* 26.0* 25.7*  MCV 100.0  --  98.2 97.1 94.7  --  94.1  PLT 286  --  322 267 255  --  265   < > = values in this interval not displayed.   Basic Metabolic Panel: Recent Labs  Lab 04/02/21 0153 04/02/21 1909 04/03/21 0113 04/04/21 0234 04/05/21 0155 04/05/21 0843 04/06/21 0514  NA 140   < > 137 140 142 143 144  K 3.9   < > 3.9 4.0 3.5 3.8 3.7  CL 105  --  108 108 111  --  109  CO2 23  --  18* 21* 21*  --  23  GLUCOSE 114*  --  129* 193* 186*  --  191*  BUN 68*  --  66* 71* 69*  --  63*  CREATININE 2.59*  --  2.49* 2.72* 2.46*  --  2.09*  CALCIUM 8.8*  --  7.7* 7.6* 8.2*  --  8.6*  MG  --   --  2.5* 2.2 2.5*  --  2.2  PHOS  --   --  4.3 3.8 3.4  --  2.4*   < > = values in this interval not displayed.   GFR: Estimated Creatinine Clearance: 35.7 mL/min (A) (by C-G formula based on SCr of 2.09 mg/dL (H)). Liver Function Tests: Recent Labs  Lab 04/03/21 0113 04/04/21 0234  AST 73* 65*  ALT 42 62*  ALKPHOS 219* 146*  BILITOT 3.9* 3.2*  PROT 4.9* 5.2*  ALBUMIN 1.8* 1.6*   No results for input(s): LIPASE, AMYLASE in the last 168 hours. No results for  input(s): AMMONIA in the last 168 hours. Coagulation Profile: No results for input(s): INR, PROTIME in the last 168 hours. Cardiac Enzymes: No results for input(s): CKTOTAL, CKMB, CKMBINDEX, TROPONINI in the last 168 hours. BNP (last 3 results) No results for input(s): PROBNP in the last 8760 hours. HbA1C: No results for input(s): HGBA1C in the last 72 hours. CBG: Recent Labs  Lab 04/05/21 1150 04/05/21 1629 04/05/21 2028 04/05/21 2355 04/06/21 0352  GLUCAP 208* 185* 223* 206* 195*   Lipid Profile: Recent Labs    04/05/21 0155 04/06/21 0514  TRIG 351* 335*   Thyroid Function Tests: No results for input(s): TSH, T4TOTAL, FREET4, T3FREE, THYROIDAB in  the last 72 hours. Anemia Panel: No results for input(s): VITAMINB12, FOLATE, FERRITIN, TIBC, IRON, RETICCTPCT in the last 72 hours. Sepsis Labs: Recent Labs  Lab 03/31/21 1125  LATICACIDVEN 0.8    No results found for this or any previous visit (from the past 240 hour(s)).       Radiology Studies: DG CHEST PORT 1 VIEW  Result Date: 04/05/2021 CLINICAL DATA:  Pneumothorax EXAM: PORTABLE CHEST 1 VIEW COMPARISON:  04/03/2021 FINDINGS: Endotracheal tube is no longer present. Right PICC line and enteric tube again identified. Low lung volumes with bibasilar atelectasis. Similar cardiomediastinal contours. No pneumothorax. IMPRESSION: Lines and tubes as above. Low lung volumes with bibasilar atelectasis. Electronically Signed   By: Macy Mis M.D.   On: 04/05/2021 08:15        Scheduled Meds:  amLODipine  10 mg Oral Daily   aspirin  324 mg Oral Daily   chlorhexidine  15 mL Mouth Rinse BID   Chlorhexidine Gluconate Cloth  6 each Topical Daily   heparin  5,000 Units Subcutaneous Q8H   insulin aspart  0-24 Units Subcutaneous Q4H   mouth rinse  15 mL Mouth Rinse q12n4p   pantoprazole  40 mg Oral Daily   sodium chloride flush  10-40 mL Intracatheter Q12H   Continuous Infusions:  amiodarone 30 mg/hr (04/06/21 0532)    fluconazole (DIFLUCAN) IV Stopped (04/05/21 2318)   lactated ringers 55 mL/hr at 04/06/21 0534   piperacillin-tazobactam (ZOSYN)  IV 12.5 mL/hr at 04/06/21 0515   potassium PHOSPHATE IVPB (in mmol)     TPN ADULT (ION) 90 mL/hr at 04/06/21 0515   TPN ADULT (ION)     vancomycin Stopped (04/04/21 1934)     LOS: 13 days    Time spent: 35 minutes    Barb Merino, MD [facility] Pager [phone]

## 2021-04-06 NOTE — Progress Notes (Signed)
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   Patient Measurements: Height: 5\' 7"  (170.2 cm) Weight: 105.2 kg (231 lb 14.8 oz) IBW/kg (Calculated) : 61.6 TPN AdjBW (KG): 66 Body mass index is 36.32 kg/m. 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 Patient was then found to have a chronically incarcerated hernia with likely ileus. S/p exploratory laparotomy, adhesiolysis x64min, extended right hemicolectomy, primary incisional hernia repair, and ileostomy creation 12/27. Patient has had very minimal PO intake since 12/19 surgery and anticipate cannot start tube feeds for some time after abdominal surgery. Surgery anticipates post-op ileus. Patient is at risk for refeeding. Pharmacy consulted for TPN.  Glucose / Insulin: no hx DM but A1C 5.9, CBGs 195-223 since last bag hung (with 10 units insulin in bag). SSI: 16 units/24h  Electrolytes: K 3.7 (goal >=4), CoCa:9.32, Phos 2.4 low, Mg 2.2 (goal >=2)  Renal: Scr 2.09 down (BL ~2), BUN 63   Hepatic: AST/ALT 60s, Tbili 3.2 down, Albumin 1.6 (then received 75g) , TG 335 Intake / Output; MIVF:  LR 48ml/hr, CLD: 335ml Ouput: UOP 0.9 ml/kg/hr, Drains 254ml, LBM 12/30,   GI Imaging: 12/18 CT abd: no findings 12/24 KUB: Gas within nondistended small bowel and mildly prominent right colon 12/25 KUB: gas in small bowel loops may suggest ileus 12/27 KUB: Increased gaseous distention of the right colon which could be due to postoperative ileus or colonic obstruction  12/27 CT abd: Large amount of free intraperitoneal air compatible with a bowel perforation; dilatation of the ascending colon and cecum indicating mechanical obstruction;  descending colon is relatively decompressed indicating complete or high-grade partial obstruction   GI Surgeries / Procedures:  12/27:  exploratory laparotomy, adhesiolysis x41min, extended right hemicolectomy, primary incisional hernia repair, and ileostomy creation  Central access: PICC 12/27 TPN start date: 12/28   Nutritional Goals: Goal TPN rate is 90 mL/hr (provides 131 g of protein, 259g dextrose, 60g lipids, and 2012 kcals per day)  RD Assessment: Estimated Needs Total Energy Estimated Needs: 1900-2100 kcals Total Protein Estimated Needs: 120-135g Total Fluid Estimated Needs: >/= 1.9 L   Current Nutrition:  TPN 12/30 CLD   Plan:  TPN at goal 90 mL/hr at 1800, provides 131 g protein, and 2012 Kcal, meeting ~100 % of needs Electrolytes in TPN: Increase K 35 mEq/L (max fit in TPN formula), Phos 9 mmol/L, max acetate; Mg 0 mEq/L;  Continue Na 100 mEq/L, Ca 4 mEq/L  Add standard MVI and trace elements to TPN Increase sSSI to Q4 hr  Increase to 20 units of insulin in TPN   KPhos 38mmol IV x 1 today Monitor TPN labs daily until stable at goal then on Mon/Thurs Watch elevated TG - decrease lipids in TPN if continues to rise  Order Boost Breeze TID as recommended by RD?  Larenda Reedy S. Alford Highland, PharmD, BCPS Clinical Staff Pharmacist [email or web address]

## 2021-04-06 NOTE — Progress Notes (Signed)
Around 0220, the patient converted to AFIb RVR with heart rate ranging from low 130s to high 150s. Her blood pressure was 127/85.  The nurse gave IV metoprolol 5 mg and quickly the patient's heart rate started to go down. The patient is now in A Fib with her heart rate ranging from 90s to 110s.  Will continue to monitor.  Dorothy Dawn, RN

## 2021-04-06 NOTE — Progress Notes (Signed)
4 Days Post-Op   Subjective/Chief Complaint: Comfortable this morning Denies abdominal pain Tolerating liquids with some fullness but wants to try full liquids   Objective: Vital signs in last 24 hours: Temp:  [98.2 F (36.8 C)-98.9 F (37.2 C)] 98.6 F (37 C) (12/31 0720) Pulse Rate:  [69-141] 123 (12/31 0720) Resp:  [18-30] 18 (12/31 0720) BP: (122-169)/(65-92) 129/89 (12/31 0720) SpO2:  [91 %-99 %] 96 % (12/31 0720) Arterial Line BP: (163)/(68) 163/68 (12/30 0900) Weight:  [105.2 kg] 105.2 kg (12/31 0346) Last BM Date: 04/05/21  Intake/Output from previous day: 12/30 0701 - 12/31 0700 In: 4426.3 [P.O.:320; I.V.:3764.4; IV Piggyback:341.9] Out: 2110 [Urine:1830; Drains:280] Intake/Output this shift: Total I/O In: 0  Out: 460 [Urine:350; Drains:110]  Exam: Awake and alert Abdomen soft, obese, drains serosang, wound VAC in place, abd minimally tender CV tachy  Lab Results:  Recent Labs    04/05/21 0155 04/05/21 0843 04/06/21 0514  WBC 17.2*  --  20.5*  HGB 8.3* 8.8* 8.7*  HCT 24.8* 26.0* 25.7*  PLT 255  --  265   BMET Recent Labs    04/05/21 0155 04/05/21 0843 04/06/21 0514  NA 142 143 144  K 3.5 3.8 3.7  CL 111  --  109  CO2 21*  --  23  GLUCOSE 186*  --  191*  BUN 69*  --  63*  CREATININE 2.46*  --  2.09*  CALCIUM 8.2*  --  8.6*   PT/INR No results for input(s): LABPROT, INR in the last 72 hours. ABG Recent Labs    04/05/21 0843  PHART 7.327*  HCO3 21.9    Studies/Results: DG CHEST PORT 1 VIEW  Result Date: 04/05/2021 CLINICAL DATA:  Pneumothorax EXAM: PORTABLE CHEST 1 VIEW COMPARISON:  04/03/2021 FINDINGS: Endotracheal tube is no longer present. Right PICC line and enteric tube again identified. Low lung volumes with bibasilar atelectasis. Similar cardiomediastinal contours. No pneumothorax. IMPRESSION: Lines and tubes as above. Low lung volumes with bibasilar atelectasis. Electronically Signed   By: Macy Mis M.D.   On: 04/05/2021  08:15    Anti-infectives: Anti-infectives (From admission, onward)    Start     Dose/Rate Route Frequency Ordered Stop   04/04/21 1800  vancomycin (VANCOREADY) IVPB 1250 mg/250 mL        1,250 mg 166.7 mL/hr over 90 Minutes Intravenous Every 48 hours 04/03/21 1501     04/03/21 0600  piperacillin-tazobactam (ZOSYN) IVPB 3.375 g        3.375 g 12.5 mL/hr over 240 Minutes Intravenous On call to O.R. 04/02/21 1603 04/03/21 0916   04/03/21 0200  piperacillin-tazobactam (ZOSYN) IVPB 3.375 g        3.375 g 12.5 mL/hr over 240 Minutes Intravenous Every 8 hours 04/02/21 2143     04/02/21 2245  fluconazole (DIFLUCAN) IVPB 200 mg        200 mg 100 mL/hr over 60 Minutes Intravenous Every 24 hours 04/02/21 2147     04/02/21 2245  vancomycin (VANCOREADY) IVPB 2000 mg/400 mL        2,000 mg 200 mL/hr over 120 Minutes Intravenous  Once 04/02/21 2158 04/03/21 0142   04/02/21 2158  vancomycin variable dose per unstable renal function (pharmacist dosing)  Status:  Discontinued         Does not apply See admin instructions 04/02/21 2159 04/03/21 1501   03/25/21 1200  vancomycin (VANCOCIN) IVPB 1000 mg/200 mL premix        1,000 mg 200 mL/hr over 60  Minutes Intravenous  Once 03/25/21 0512 03/25/21 1206   03/25/21 0600  ceFAZolin (ANCEF) IVPB 2g/100 mL premix        2 g 200 mL/hr over 30 Minutes Intravenous Every 8 hours 03/25/21 0512 03/26/21 2217   03/24/21 1800  ceFAZolin (ANCEF) IVPB 2g/100 mL premix        2 g 200 mL/hr over 30 Minutes Intravenous To Surgery 03/24/21 1738 03/25/21 0308   03/24/21 1800  ceFAZolin (ANCEF) IVPB 2g/100 mL premix  Status:  Discontinued        2 g 200 mL/hr over 30 Minutes Intravenous To Surgery 03/24/21 1734 03/25/21 0512   03/24/21 1800  vancomycin (VANCOREADY) IVPB 1250 mg/250 mL        1,250 mg 166.7 mL/hr over 90 Minutes Intravenous To Surgery 03/24/21 1738 03/24/21 2114       Assessment/Plan: Chronically incarcerated ventral hernia Bowel perforation -POD  #4 s/p ex lap, adhesiolysis, extended R hemicolectomy, primary incisional hernia repair, ileostomy creation, abdominal washout, drain placement x2, incisional wound vac placement 30x8x4cm - Dr. Bobbye Morton 04/02/21  Will try full liquid diet WBC up.  If it continues to increase, will check a CT of the abdominal and pelvis although clinically I doubt intra-abdominal abscess.  Coralie Keens MD 04/06/2021

## 2021-04-06 NOTE — Progress Notes (Addendum)
[address]       [place] [address]             [phone]      4 Days Post-Op Procedure(s) (LRB): EXPLORATORY LAPAROTOMY (N/A) EXTENDED COLON RESECTION ILEOSTOMY (N/A) HERNIA REPAIR VENTRAL ADULT LYSIS OF ADHESION, EXTENSIVE (N/A) Subjective: Tol som clears but feels full quickly. Pain pretty well controlled  Objective: Vital signs in last 24 hours: Temp:  [98.2 F (36.8 C)-98.9 F (37.2 C)] 98.6 F (37 C) (12/31 0346) Pulse Rate:  [69-141] 98 (12/31 0346) Cardiac Rhythm: Normal sinus rhythm (12/30 1919) Resp:  [20-39] 20 (12/31 0346) BP: (122-169)/(65-92) 135/92 (12/31 0346) SpO2:  [91 %-99 %] 97 % (12/31 0346) Arterial Line BP: (163-193)/(68-81) 163/68 (12/30 0900) Weight:  [105.2 kg] 105.2 kg (12/31 0346)  Hemodynamic parameters for last 24 hours:    Intake/Output from previous day: 12/30 0701 - 12/31 0700 In: 4426.3 [P.O.:320; I.V.:3764.4; IV Piggyback:341.9] Out: 2110 [Urine:1830; Drains:280] Intake/Output this shift: Total I/O In: -  Out: 110 [Drains:110]  General appearance: alert, cooperative, fatigued, and no distress Heart: irregularly irregular rhythm Lungs: clear in ant fields Abdomen: VAC in place, small amount bilious drainage in ostomy, no gas, + BS  Extremities: warm, no edema Wound: sternotomy looks to be healing well  Lab Results: Recent Labs    04/05/21 0155 04/05/21 0843 04/06/21 0514  WBC 17.2*  --  20.5*  HGB 8.3* 8.8* 8.7*  HCT 24.8* 26.0* 25.7*  PLT 255  --  265   BMET:  Recent Labs    04/05/21 0155 04/05/21 0843 04/06/21 0514  NA 142 143 144  K 3.5 3.8 3.7  CL 111  --  109  CO2 21*  --  23  GLUCOSE 186*  --  191*  BUN 69*  --  63*  CREATININE 2.46*  --  2.09*  CALCIUM 8.2*  --  8.6*    PT/INR: No results for input(s): LABPROT, INR in the last 72 hours. ABG    Component Value Date/Time   PHART 7.327 (L) 04/05/2021 0843   HCO3 21.9 04/05/2021 0843   TCO2 23 04/05/2021 0843   ACIDBASEDEF  4.0 (H) 04/05/2021 0843   O2SAT 92.0 04/05/2021 0843   CBG (last 3)  Recent Labs    04/05/21 2028 04/05/21 2355 04/06/21 0352  GLUCAP 223* 206* 195*    Meds Scheduled Meds:  amLODipine  5 mg Oral Daily   aspirin  324 mg Oral Daily   chlorhexidine  15 mL Mouth Rinse BID   Chlorhexidine Gluconate Cloth  6 each Topical Daily   heparin  5,000 Units Subcutaneous Q8H   insulin aspart  0-9 Units Subcutaneous Q4H   mouth rinse  15 mL Mouth Rinse q12n4p   pantoprazole  40 mg Oral Daily   sodium chloride flush  10-40 mL Intracatheter Q12H   Continuous Infusions:  amiodarone 30 mg/hr (04/06/21 0532)   fluconazole (DIFLUCAN) IV Stopped (04/05/21 2318)   lactated ringers 55 mL/hr at 04/06/21 0534   piperacillin-tazobactam (ZOSYN)  IV 12.5 mL/hr at 04/06/21 0515   TPN ADULT (ION) 90 mL/hr at 04/06/21 0515   vancomycin Stopped (04/04/21 1934)   PRN Meds:.albuterol, hydrALAZINE, LORazepam, morphine injection, ondansetron (ZOFRAN) IV, sodium chloride flush  Xrays DG CHEST PORT 1 VIEW  Result Date: 04/05/2021 CLINICAL DATA:  Pneumothorax EXAM: PORTABLE CHEST 1 VIEW COMPARISON:  04/03/2021 FINDINGS: Endotracheal tube is no longer present. Right PICC line and enteric tube again identified. Low lung  volumes with bibasilar atelectasis. Similar cardiomediastinal contours. No pneumothorax. IMPRESSION: Lines and tubes as above. Low lung volumes with bibasilar atelectasis. Electronically Signed   By: Macy Mis M.D.   On: 04/05/2021 08:15      Results for orders placed or performed during the hospital encounter of 03/24/21  Resp Panel by RT-PCR (Flu A&B, Covid) Nasopharyngeal Swab     Status: None   Collection Time: 03/24/21  1:16 PM   Specimen: Nasopharyngeal Swab; Nasopharyngeal(NP) swabs in vial transport medium  Result Value Ref Range Status   SARS Coronavirus 2 by RT PCR NEGATIVE NEGATIVE Final    Comment: (NOTE) SARS-CoV-2 target nucleic acids are NOT DETECTED.  The SARS-CoV-2 RNA  is generally detectable in upper respiratory specimens during the acute phase of infection. The lowest concentration of SARS-CoV-2 viral copies this assay can detect is 138 copies/mL. A negative result does not preclude SARS-Cov-2 infection and should not be used as the sole basis for treatment or other patient management decisions. A negative result may occur with  improper specimen collection/handling, submission of specimen other than nasopharyngeal swab, presence of viral mutation(s) within the areas targeted by this assay, and inadequate number of viral copies(<138 copies/mL). A negative result must be combined with clinical observations, patient history, and epidemiological information. The expected result is Negative.  Fact Sheet for Patients:  EntrepreneurPulse.com.au  Fact Sheet for Healthcare Providers:  IncredibleEmployment.be  This test is no t yet approved or cleared by the [place] FDA and  has been authorized for detection and/or diagnosis of SARS-CoV-2 by FDA under an Emergency Use Authorization (EUA). This EUA will remain  in effect (meaning this test can be used) for the duration of the COVID-19 declaration under Section 564(b)(1) of the Act, 21 U.S.C.section 360bbb-3(b)(1), unless the authorization is terminated  or revoked sooner.       Influenza A by PCR NEGATIVE NEGATIVE Final   Influenza B by PCR NEGATIVE NEGATIVE Final    Comment: (NOTE) The Xpert Xpress SARS-CoV-2/FLU/RSV plus assay is intended as an aid in the diagnosis of influenza from Nasopharyngeal swab specimens and should not be used as a sole basis for treatment. Nasal washings and aspirates are unacceptable for Xpert Xpress SARS-CoV-2/FLU/RSV testing.  Fact Sheet for Patients: EntrepreneurPulse.com.au  Fact Sheet for Healthcare Providers: IncredibleEmployment.be  This test is not yet approved or cleared by the  [place] FDA and has been authorized for detection and/or diagnosis of SARS-CoV-2 by FDA under an Emergency Use Authorization (EUA). This EUA will remain in effect (meaning this test can be used) for the duration of the COVID-19 declaration under Section 564(b)(1) of the Act, 21 U.S.C. section 360bbb-3(b)(1), unless the authorization is terminated or revoked.  Performed at [facility], [address], [place], [place] [address]   MRSA Next Gen by PCR, Nasal     Status: None   Collection Time: 03/25/21  7:40 AM   Specimen: Nasal Mucosa; Nasal Swab  Result Value Ref Range Status   MRSA by PCR Next Gen NOT DETECTED NOT DETECTED Final    Comment: (NOTE) The GeneXpert MRSA Assay (FDA approved for NASAL specimens only), is one component of a comprehensive MRSA colonization surveillance program. It is not intended to diagnose MRSA infection nor to guide or monitor treatment for MRSA infections. Test performance is not FDA approved in patients less than 21 years old. Performed at [facility], [place] [address] [place], [place] [address]     Assessment/Plan: S/P Procedure(s) (LRB): EXPLORATORY  LAPAROTOMY (N/A) EXTENDED COLON RESECTION ILEOSTOMY (N/A) HERNIA REPAIR VENTRAL ADULT LYSIS OF ADHESION, EXTENSIVE (N/A)  1 afeb, SBP 120's-160's, afib with RVR overnight, rate improved after IV lopressor, on amio gtt- on hydralizine, norvasc- will increase to 10- CCM considering Beta blocker 2 sats good on 4 liters [place] 3 good UOP, weight prob not accurate as up 5 kg from yesterday creat conts to trend lower, 2.09 today. BUN also improved to 63- not on diuretics 4 conts TPN- till po better 5 ID- on diflucan , zosyn , vanco- leukocytosis trend is higher 6 BS some readings in low 200's, cont SSI- change to TCTS correction scale SSI 7 ostomy care- [place] nurse 8 general surgery managing bowel perforation issues- mod drainage from abdominal drains, conts VAC 10 anemia is stable 11 sq hep for  DVT ppx 12 CXR , lung fields appear clear 13 rehab and pulm hygiene as able    LOS: 13 days    John Giovanni PA-C Pager [phone] 04/06/2021   Patient seen and examined, agree with above A fib with controlled VR currently  Remo Lipps C. Roxan Hockey, MD [facility] [phone]

## 2021-04-07 ENCOUNTER — Inpatient Hospital Stay (HOSPITAL_COMMUNITY): Payer: PRIVATE HEALTH INSURANCE

## 2021-04-07 DIAGNOSIS — N1831 Chronic kidney disease, stage 3a: Secondary | ICD-10-CM

## 2021-04-07 DIAGNOSIS — M7989 Other specified soft tissue disorders: Secondary | ICD-10-CM

## 2021-04-07 DIAGNOSIS — I5033 Acute on chronic diastolic (congestive) heart failure: Secondary | ICD-10-CM

## 2021-04-07 DIAGNOSIS — J9601 Acute respiratory failure with hypoxia: Secondary | ICD-10-CM

## 2021-04-07 LAB — VITAMIN B12: Vitamin B-12: 1026 pg/mL — ABNORMAL HIGH (ref 180–914)

## 2021-04-07 LAB — GLUCOSE, CAPILLARY
Glucose-Capillary: 202 mg/dL — ABNORMAL HIGH (ref 70–99)
Glucose-Capillary: 215 mg/dL — ABNORMAL HIGH (ref 70–99)
Glucose-Capillary: 198 mg/dL — ABNORMAL HIGH (ref 70–99)
Glucose-Capillary: 224 mg/dL — ABNORMAL HIGH (ref 70–99)
Glucose-Capillary: 175 mg/dL — ABNORMAL HIGH (ref 70–99)

## 2021-04-07 LAB — CBC
HCT: 24.7 % — ABNORMAL LOW (ref 36.0–46.0)
Hemoglobin: 8.2 g/dL — ABNORMAL LOW (ref 12.0–15.0)
MCH: 31.1 pg (ref 26.0–34.0)
MCHC: 33.2 g/dL (ref 30.0–36.0)
MCV: 93.6 fL (ref 80.0–100.0)
Platelets: 281 10*3/uL (ref 150–400)
RBC: 2.64 MIL/uL — ABNORMAL LOW (ref 3.87–5.11)
RDW: 19.8 % — ABNORMAL HIGH (ref 11.5–15.5)
WBC: 22.5 10*3/uL — ABNORMAL HIGH (ref 4.0–10.5)
nRBC: 0.3 % — ABNORMAL HIGH (ref 0.0–0.2)

## 2021-04-07 LAB — BASIC METABOLIC PANEL
Anion gap: 13 (ref 5–15)
BUN: 56 mg/dL — ABNORMAL HIGH (ref 6–20)
CO2: 26 mmol/L (ref 22–32)
Calcium: 8.3 mg/dL — ABNORMAL LOW (ref 8.9–10.3)
Chloride: 104 mmol/L (ref 98–111)
Creatinine, Ser: 1.81 mg/dL — ABNORMAL HIGH (ref 0.44–1.00)
GFR, Estimated: 32 mL/min — ABNORMAL LOW (ref >60)
Glucose, Bld: 277 mg/dL — ABNORMAL HIGH (ref 70–99)
Potassium: 3.8 mmol/L (ref 3.5–5.1)
Sodium: 143 mmol/L (ref 135–145)

## 2021-04-07 LAB — IRON AND TIBC
Iron: 31 ug/dL (ref 28–170)
Saturation Ratios: 26 % (ref 10.4–31.8)
TIBC: 119 ug/dL — ABNORMAL LOW (ref 250–450)
UIBC: 88 ug/dL

## 2021-04-07 LAB — FERRITIN: Ferritin: 377 ng/mL — ABNORMAL HIGH (ref 11–307)

## 2021-04-07 LAB — BRAIN NATRIURETIC PEPTIDE: B Natriuretic Peptide: 1764.1 pg/mL — ABNORMAL HIGH (ref 0.0–100.0)

## 2021-04-07 LAB — MAGNESIUM: Magnesium: 1.8 mg/dL (ref 1.7–2.4)

## 2021-04-07 LAB — PROCALCITONIN: Procalcitonin: 2.05 ng/mL

## 2021-04-07 LAB — PHOSPHORUS: Phosphorus: 2.5 mg/dL (ref 2.5–4.6)

## 2021-04-07 MED ORDER — METOCLOPRAMIDE HCL 5 MG/ML IJ SOLN
5.0000 mg | Freq: Three times a day (TID) | INTRAMUSCULAR | Status: AC
  Administered 2021-04-07 – 2021-04-08 (×3): 5 mg via INTRAVENOUS
  Filled 2021-04-07 (×3): qty 2

## 2021-04-07 MED ORDER — AMIODARONE HCL 200 MG PO TABS
200.0000 mg | ORAL_TABLET | Freq: Two times a day (BID) | ORAL | Status: DC
  Administered 2021-04-07 – 2021-04-14 (×15): 200 mg via ORAL
  Filled 2021-04-07 (×15): qty 1

## 2021-04-07 MED ORDER — FUROSEMIDE 10 MG/ML IJ SOLN
40.0000 mg | Freq: Three times a day (TID) | INTRAMUSCULAR | Status: DC
  Administered 2021-04-07 – 2021-04-08 (×3): 40 mg via INTRAVENOUS
  Filled 2021-04-07 (×3): qty 4

## 2021-04-07 MED ORDER — POTASSIUM PHOSPHATES 15 MMOLE/5ML IV SOLN
30.0000 mmol | Freq: Once | INTRAVENOUS | Status: AC
  Administered 2021-04-07: 30 mmol via INTRAVENOUS
  Filled 2021-04-07: qty 10

## 2021-04-07 MED ORDER — IPRATROPIUM-ALBUTEROL 0.5-2.5 (3) MG/3ML IN SOLN
3.0000 mL | Freq: Four times a day (QID) | RESPIRATORY_TRACT | Status: DC
  Administered 2021-04-07: 3 mL via RESPIRATORY_TRACT
  Filled 2021-04-07: qty 3

## 2021-04-07 MED ORDER — AMIODARONE HCL 200 MG PO TABS
400.0000 mg | ORAL_TABLET | Freq: Two times a day (BID) | ORAL | Status: DC
Start: 2021-04-07 — End: 2021-04-07

## 2021-04-07 MED ORDER — FUROSEMIDE 10 MG/ML IJ SOLN: 40.0000 mg | Freq: Two times a day (BID) | INTRAMUSCULAR | Status: DC

## 2021-04-07 MED ORDER — TRAVASOL 10 % IV SOLN
INTRAVENOUS | Status: DC
  Filled 2021-04-07: qty 658.8

## 2021-04-07 MED ORDER — IPRATROPIUM-ALBUTEROL 0.5-2.5 (3) MG/3ML IN SOLN
3.0000 mL | RESPIRATORY_TRACT | Status: DC
  Administered 2021-04-07 – 2021-04-08 (×10): 3 mL via RESPIRATORY_TRACT
  Filled 2021-04-07 (×10): qty 3

## 2021-04-07 NOTE — Progress Notes (Addendum)
[address]       [place] [address]             [phone]      5 Days Post-Op Procedure(s) (LRB): EXPLORATORY LAPAROTOMY (N/A) EXTENDED COLON RESECTION ILEOSTOMY (N/A) HERNIA REPAIR VENTRAL ADULT LYSIS OF ADHESION, EXTENSIVE (N/A) Subjective: Feels fair, got very anxious last night  Objective: Vital signs in last 24 hours: Temp:  [98.6 F (37 C)-98.8 F (37.1 C)] 98.8 F (37.1 C) (01/01 0357) Pulse Rate:  [87-125] 94 (01/01 0357) Cardiac Rhythm: Normal sinus rhythm (12/31 2030) Resp:  [18-24] 20 (01/01 0357) BP: (125-179)/(79-92) 159/79 (01/01 0357) SpO2:  [93 %-99 %] 93 % (01/01 0357) Weight:  [105 kg] 105 kg (01/01 0357)  Hemodynamic parameters for last 24 hours:    Intake/Output from previous day: 12/31 0701 - 01/01 0700 In: 2760.4 [P.O.:720; I.V.:1940.4; IV Piggyback:100] Out: 3100 [Urine:2475; Drains:625] Intake/Output this shift: No intake/output data recorded.  General appearance: distracted, fatigued, and mild distress Heart: regular rate and rhythm Lungs: coarse with upper airwau pseudo-wheeze Abdomen: non tender or distended, some BS Extremities: LUE with increased swelling /edema Wound: stable in appearance  Lab Results: Recent Labs    04/06/21 0514 04/07/21 0307  WBC 20.5* 22.5*  HGB 8.7* 8.2*  HCT 25.7* 24.7*  PLT 265 281   BMET:  Recent Labs    04/06/21 0514 04/07/21 0307  NA 144 143  K 3.7 3.8  CL 109 104  CO2 23 26  GLUCOSE 191* 277*  BUN 63* 56*  CREATININE 2.09* 1.81*  CALCIUM 8.6* 8.3*    PT/INR: No results for input(s): LABPROT, INR in the last 72 hours. ABG    Component Value Date/Time   PHART 7.327 (L) 04/05/2021 0843   HCO3 21.9 04/05/2021 0843   TCO2 23 04/05/2021 0843   ACIDBASEDEF 4.0 (H) 04/05/2021 0843   O2SAT 92.0 04/05/2021 0843   CBG (last 3)  Recent Labs    04/06/21 1947 04/06/21 2300 04/07/21 0402  GLUCAP 189* 181* 202*    Meds Scheduled Meds:  amLODipine  10 mg Oral  Daily   aspirin  324 mg Oral Daily   chlorhexidine  15 mL Mouth Rinse BID   Chlorhexidine Gluconate Cloth  6 each Topical Daily   fluconazole  200 mg Oral QHS   heparin  5,000 Units Subcutaneous Q8H   insulin aspart  0-24 Units Subcutaneous Q4H   mouth rinse  15 mL Mouth Rinse q12n4p   pantoprazole  40 mg Oral Daily   sodium chloride flush  10-40 mL Intracatheter Q12H   Continuous Infusions:  amiodarone 30 mg/hr (04/07/21 0431)   lactated ringers 55 mL/hr at 04/06/21 2100   piperacillin-tazobactam (ZOSYN)  IV 3.375 g (04/07/21 0524)   TPN ADULT (ION) 90 mL/hr at 04/06/21 1811   vancomycin 1,250 mg (04/06/21 1800)   PRN Meds:.albuterol, hydrALAZINE, LORazepam, morphine injection, ondansetron (ZOFRAN) IV, sodium chloride flush  Xrays DG CHEST PORT 1 VIEW  Result Date: 04/06/2021 CLINICAL DATA:  61 year old female with history of left-sided pleural effusion status post surgery. EXAM: PORTABLE CHEST 1 VIEW COMPARISON:  Chest x-ray 04/05/2021. FINDINGS: Previously noted enteric tube has been removed. There is a right upper extremity PICC with tip terminating in the mid superior vena cava. Lung volumes are low. There are bibasilar opacities which may reflect areas of atelectasis and/or consolidation. Small left pleural effusion. No definite right pleural effusion. No pneumothorax. No evidence of pulmonary edema. Heart size is mildly  enlarged. Upper mediastinal contours are remarkable for prominence of the thoracic aorta, similar to the prior study, compatible with known aortic aneurysm/dissection better demonstrated on recent chest CTA 03/24/2021. Status post median sternotomy for CABG. IMPRESSION: 1. Postoperative changes and support apparatus, as above. 2. Low lung volumes with bibasilar areas of atelectasis and/or consolidation and small left pleural effusion. 3. Known aortic aneurysm/dissection grossly similar in appearance to prior examinations, as above. Please see prior chest CT 03/24/2021  for full description. Electronically Signed   By: Vinnie Langton M.D.   On: 04/06/2021 11:01    Assessment/Plan: S/P Procedure(s) (LRB): EXPLORATORY LAPAROTOMY (N/A) EXTENDED COLON RESECTION ILEOSTOMY (N/A) HERNIA REPAIR VENTRAL ADULT LYSIS OF ADHESION, EXTENSIVE (N/A)   1 afebrile, s BP 120's-170's, HR 80's- 120's, sinus , may need additional BP agent 2 sats ok on 4 liters 3 excellent UOP, BUN/Creat improved trend 4 JP drains- 625 cc/24 h  5 conts TNA, electrolyte replacement through, on full liquid 6 leukocytosis trend increased w/ WBC 22.5, conts Vanc/zosyn/diflucan- may need to check abdominal CT -defer to surgery 7 cont SSI, getting insulin also in TPN 8 H/H down a little further, 8.2/24.7- approaching transfusion threshold 9 sq heparin for DVT PPX- with left arm swelling will change amio to po and check vascular duplex 10 change albuterol to duoneb 11 rehab and pulm hygiene as able  LOS: 14 days    John Giovanni PA-C Pager [phone] 04/07/2021   Patient seen and examined, agree with above, discussed with Mr. Girtha Rm On broad spectrum antibiotics and afebrile but WBC rising  Remo Lipps C. Roxan Hockey, MD [facility] [phone]

## 2021-04-07 NOTE — Progress Notes (Signed)
5 Days Post-Op   Subjective/Chief Complaint: She reports feeling a little full this morning but denies abdominal pain.  Wants to stay on full liquids   Objective: Vital signs in last 24 hours: Temp:  [97.8 F (36.6 C)-98.8 F (37.1 C)] 97.8 F (36.6 C) (01/01 0816) Pulse Rate:  [87-125] 101 (01/01 0816) Resp:  [18-28] 28 (01/01 0816) BP: (125-179)/(79-92) 169/87 (01/01 0816) SpO2:  [93 %-99 %] 95 % (01/01 0816) Weight:  [105 kg] 105 kg (01/01 0357) Last BM Date: 04/05/21  Intake/Output from previous day: 12/31 0701 - 01/01 0700 In: 2760.4 [P.O.:720; I.V.:1940.4; IV Piggyback:100] Out: 3100 [Urine:2475; Drains:625] Intake/Output this shift: Total I/O In: 75 [P.O.:75] Out: 650 [Urine:650]  Exam: Awake and alert Looks a little SOB Abdomen obese, a little distended, ostomy pink with bile in the bag Drains are both serosang without purulence Vac in place  Lab Results:  Recent Labs    04/06/21 0514 04/07/21 0307  WBC 20.5* 22.5*  HGB 8.7* 8.2*  HCT 25.7* 24.7*  PLT 265 281   BMET Recent Labs    04/06/21 0514 04/07/21 0307  NA 144 143  K 3.7 3.8  CL 109 104  CO2 23 26  GLUCOSE 191* 277*  BUN 63* 56*  CREATININE 2.09* 1.81*  CALCIUM 8.6* 8.3*   PT/INR No results for input(s): LABPROT, INR in the last 72 hours. ABG Recent Labs    04/05/21 0843  PHART 7.327*  HCO3 21.9    Studies/Results: DG CHEST PORT 1 VIEW  Result Date: 04/06/2021 CLINICAL DATA:  61 year old female with history of left-sided pleural effusion status post surgery. EXAM: PORTABLE CHEST 1 VIEW COMPARISON:  Chest x-ray 04/05/2021. FINDINGS: Previously noted enteric tube has been removed. There is a right upper extremity PICC with tip terminating in the mid superior vena cava. Lung volumes are low. There are bibasilar opacities which may reflect areas of atelectasis and/or consolidation. Small left pleural effusion. No definite right pleural effusion. No pneumothorax. No evidence of  pulmonary edema. Heart size is mildly enlarged. Upper mediastinal contours are remarkable for prominence of the thoracic aorta, similar to the prior study, compatible with known aortic aneurysm/dissection better demonstrated on recent chest CTA 03/24/2021. Status post median sternotomy for CABG. IMPRESSION: 1. Postoperative changes and support apparatus, as above. 2. Low lung volumes with bibasilar areas of atelectasis and/or consolidation and small left pleural effusion. 3. Known aortic aneurysm/dissection grossly similar in appearance to prior examinations, as above. Please see prior chest CT 03/24/2021 for full description. Electronically Signed   By: Vinnie Langton M.D.   On: 04/06/2021 11:01    Anti-infectives: Anti-infectives (From admission, onward)    Start     Dose/Rate Route Frequency Ordered Stop   04/06/21 2200  fluconazole (DIFLUCAN) tablet 200 mg        200 mg Oral Daily at bedtime 04/06/21 1232     04/04/21 1800  vancomycin (VANCOREADY) IVPB 1250 mg/250 mL        1,250 mg 166.7 mL/hr over 90 Minutes Intravenous Every 48 hours 04/03/21 1501     04/03/21 0600  piperacillin-tazobactam (ZOSYN) IVPB 3.375 g        3.375 g 12.5 mL/hr over 240 Minutes Intravenous On call to O.R. 04/02/21 1603 04/03/21 0916   04/03/21 0200  piperacillin-tazobactam (ZOSYN) IVPB 3.375 g        3.375 g 12.5 mL/hr over 240 Minutes Intravenous Every 8 hours 04/02/21 2143     04/02/21 2245  fluconazole (DIFLUCAN) IVPB 200  mg  Status:  Discontinued        200 mg 100 mL/hr over 60 Minutes Intravenous Every 24 hours 04/02/21 2147 04/06/21 1231   04/02/21 2245  vancomycin (VANCOREADY) IVPB 2000 mg/400 mL        2,000 mg 200 mL/hr over 120 Minutes Intravenous  Once 04/02/21 2158 04/03/21 0142   04/02/21 2158  vancomycin variable dose per unstable renal function (pharmacist dosing)  Status:  Discontinued         Does not apply See admin instructions 04/02/21 2159 04/03/21 1501   03/25/21 1200  vancomycin  (VANCOCIN) IVPB 1000 mg/200 mL premix        1,000 mg 200 mL/hr over 60 Minutes Intravenous  Once 03/25/21 0512 03/25/21 1206   03/25/21 0600  ceFAZolin (ANCEF) IVPB 2g/100 mL premix        2 g 200 mL/hr over 30 Minutes Intravenous Every 8 hours 03/25/21 0512 03/26/21 2217   03/24/21 1800  ceFAZolin (ANCEF) IVPB 2g/100 mL premix        2 g 200 mL/hr over 30 Minutes Intravenous To Surgery 03/24/21 1738 03/25/21 0308   03/24/21 1800  ceFAZolin (ANCEF) IVPB 2g/100 mL premix  Status:  Discontinued        2 g 200 mL/hr over 30 Minutes Intravenous To Surgery 03/24/21 1734 03/25/21 0512   03/24/21 1800  vancomycin (VANCOREADY) IVPB 1250 mg/250 mL        1,250 mg 166.7 mL/hr over 90 Minutes Intravenous To Surgery 03/24/21 1738 03/24/21 2114       Assessment/Plan: Chronically incarcerated ventral hernia Bowel perforation -POD #5 s/p ex lap, adhesiolysis, extended R hemicolectomy, primary incisional hernia repair, ileostomy creation, abdominal washout, drain placement x2, incisional wound vac placement 30x8x4cm - Dr. Bobbye Morton 04/02/21  WBC up more today.  May be pulmonary but can't rule out intra-abdominal source despite normal fluid in both drains. If up more tomorrow, will need the CT with oral contrast.  Will hold today given distension and try some reglan  Coralie Keens MD 04/07/2021

## 2021-04-07 NOTE — Progress Notes (Signed)
Upper extremity venous duplex has been completed.   Preliminary results in CV Proc.   Jinny Blossom Marton Malizia 04/07/2021 11:34 AM

## 2021-04-07 NOTE — Progress Notes (Signed)
[facility] night cross cover note:  I was notified by RN of patient's request for prn pharmacologic intervention for anxiety in the context of patient's report of a history of GAD as well as previously being on prn benzodiazepines as an outpatient.  Subsequently, I placed order for prn Ativan.    Babs Bertin, DO Hospitalist

## 2021-04-07 NOTE — Progress Notes (Signed)
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   Patient Measurements: Height: 5\' 7"  (170.2 cm) Weight: 105 kg (231 lb 7.7 oz) IBW/kg (Calculated) : 61.6 TPN AdjBW (KG): 66 Body mass index is 36.26 kg/m. 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 Patient was then found to have a chronically incarcerated hernia with likely ileus. S/p exploratory laparotomy, adhesiolysis x67min, extended right hemicolectomy, primary incisional hernia repair, and ileostomy creation 12/27. Patient has had very minimal PO intake since 12/19 surgery and anticipate cannot start tube feeds for some time after abdominal surgery. Surgery anticipates post-op ileus. Patient is at risk for refeeding. Pharmacy consulted for TPN.  Glucose / Insulin: no hx DM but A1C 5.9, CBGs 181-206 since last bag hung (with 20 units insulin in bag). SSI: 28 units/24h - likely elevated due to TPN + increased po intake  Electrolytes: K 3.8 (goal >=4), CoCa:10.22, Phos 2.5, Mg 1.8 (goal >=2)  Renal: Scr 1.81 down (BL ~2), BUN 56 declining Hepatic: AST/ALT 60s, Tbili 3.2 down, Albumin 1.6 (then received 75g) , TG 335 Intake / Output; MIVF:  LR 17ml/hr, FLD: 713ml Ouput: UOP 1 ml/kg/hr, Drains 259ml>>up 625 last 42h, LBM 12/30,   GI Imaging: 12/18 CT abd: no findings 12/24 KUB: Gas within nondistended small bowel and mildly prominent right colon 12/25 KUB: gas in small bowel loops may suggest ileus 12/27 KUB: Increased gaseous distention of the right colon which could be due to postoperative ileus or colonic obstruction  12/27 CT abd: Large amount of free intraperitoneal air compatible with a bowel perforation;  dilatation of the ascending colon and cecum indicating mechanical obstruction; descending colon is relatively decompressed indicating complete or high-grade partial obstruction   GI Surgeries / Procedures:  12/27:  exploratory laparotomy, adhesiolysis x35min, extended right hemicolectomy, primary incisional hernia repair, and ileostomy creation  Central access: PICC 12/27 TPN start date: 12/28   Nutritional Goals: Goal TPN rate is 90 mL/hr (provides 131 g of protein, 259g dextrose, 60g lipids, and 2012 kcals per day)  RD Assessment: Estimated Needs Total Energy Estimated Needs: 1900-2100 kcals Total Protein Estimated Needs: 120-135g Total Fluid Estimated Needs: >/= 1.9 L   Current Nutrition:  TPN 12/31: FLD  Plan:  May decrease TPN to half-rate today per surgery PA with good po intake. Decrease TPN to 45 mL/hr at 1800 next bag Electrolytes in TPN: Na 100 mEq/L, Ca 0 mEq/L, K 35 mEq/L, Phos 9 mmol/L, Cl:Ac 1:1; Mg 5 mEq/L;  Add standard MVI and trace elements to TPN TCTS SSI q Q4 hr, change to TIDac tomorrow if TPN discontinued Will have to manage hyperglycemia outside the TPN with decreased rate. KPhos 93mmol IV x 1 today Monitor TPN labs Mon/Thurs and PRN Watch elevated TG  Order Boost Breeze TID as recommended by RD 12/30? WBC increasing: f/u if needs re-CT.  Anamarie Hunn S. Alford Highland, PharmD, BCPS Clinical Staff Pharmacist [email or web address]

## 2021-04-07 NOTE — Progress Notes (Signed)
PROGRESS NOTE    Dorothy Harris  VXE:[id number] DOB: [birth year] DOA: 03/24/2021 PCP: No primary care provider on file.   Chief complaint.  Shortness of breath. Brief Narrative:  Patient has history of uncontrolled hypertension, smoker, acute type B aortic dissection in 2019, aneurysmal dilatation of proximal descending aorta 2019 and lost follow-up presented to [facility], ER on 12/18 with chest pain and shortness of breath.  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  Return to surgical ICU postop, subsequently developed abdominal pain needing to go back to operating room on 12/27.  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  Extended right hemicolectomy and ileostomy. Transferred to medical floor 12/30 on amiodarone drip, TPN. 1/1.  Patient has significant short of breath and wheezing, elevated BNP, started on IV Lasix.  Assessment & Plan:   Principal Problem:   Status post surgery Active Problems:   S/P ascending aortic replacement   Encounter for weaning from ventilator [facility])   Encounter for postanesthesia care   Peritonitis (HCC)   Endotracheally intubated   Acute renal failure superimposed on stage 3a chronic kidney disease (HCC)   Malnutrition of moderate degree  Acute on chronic diastolic congestive heart failure. Acute hypoxemic respiratory failure secondary to congestive heart failure. Patient has significant wheezing and shortness of breath today, she has been requiring 4 L oxygen due to hypoxemia. She has a profound elevation of BNP. Reviewed echocardiogram performed preop, ejection fraction was normal. Patient has acute on chronic diastolic congestive heart failure due to volume overload. Will start IV Lasix. Patient still has TPN due to limited p.o.  intake. Continue DuoNeb for bronchospasm.  Ascending aortic dissection with rupture status post surgical replacement of aortic arch. Followed by vascular surgery.  Bacterial peritonitis secondary to ruptured viscus. Colon perforation secondary to incarcerated colon, cecal volvulus and colon ischemia. Patient condition appears to be improving, still on broad-spectrum antibiotics.  She is started on liquid diet, but has limited p.o. intake.  TPN will be continued.  Acute kidney injury on chronic kidney disease stage IIIa. Renal function is getting better.  Persistent atrial fibrillation with RVR. Heart rate much better.  Anemia. Check iron, B12 level.    DVT prophylaxis: SQ heparin Code Status: Full Family Communication:  Disposition Plan:    Status is: Inpatient  Remains inpatient appropriate because: Severity of disease.  Still requiring TPN.        I/O last 3 completed shifts: In: 4864 [P.O.:920; I.V.:3658.2; IV Piggyback:285.8] Out: 4330 [Urine:3575; Drains:755] Total I/O In: 75 [P.O.:75] Out: 850 [Urine:850]     Consultants:  GS, CT surgery  Procedures:   Antimicrobials: Zosyn and vancomycin. Subjective: Patient has significant short of breath and wheezing today.  She does not have a cough. Denies any fever chills  No abdominal pain nausea vomiting, she is tolerating small amount of liquid diet today. No dysuria hematuria  No chest pain palpitation.   Objective: Vitals:   04/06/21 2300 04/06/21 2303 04/07/21 0357 04/07/21 0816  BP: (!) 179/85 (!) 166/83 (!) 159/79 (!) 169/87  Pulse: 90  94 (!) 101  Resp: (!) 24  20 (!) 28  Temp: 98.7 F (37.1 C)  98.8 F (37.1 C) 97.8 F (36.6 C)  TempSrc: Oral  Oral Oral  SpO2: 94%  93% 95%  Weight:  105 kg   Height:        Intake/Output Summary (Last 24 hours) at 04/07/2021 1118 Last data filed at 04/07/2021 1023 Gross per 24 hour  Intake 2835.4 ml  Output 3390 ml  Net -554.6 ml   Filed Weights    04/05/21 0500 04/06/21 0346 04/07/21 0357  Weight: 100.1 kg 105.2 kg 105 kg    Examination:  General exam: Appears calm and comfortable  Respiratory system: Diffuse wheezing and crackles in the base. Respiratory effort normal. Cardiovascular system: S1 & S2 heard, RRR. No JVD, murmurs, rubs, gallops or clicks. No pedal edema. Gastrointestinal system: Abdomen is nondistended, soft and nontender. No organomegaly or masses felt. Normal bowel sounds heard. Central nervous system: Alert and oriented. No focal neurological deficits. Extremities: Symmetric 5 x 5 power. Skin: No rashes, lesions or ulcers Psychiatry: Judgement and insight appear normal. Mood & affect appropriate.     Data Reviewed: I have personally reviewed following labs and imaging studies  CBC: Recent Labs  Lab 04/03/21 1133 04/04/21 0234 04/05/21 0155 04/05/21 0843 04/06/21 0514 04/07/21 0307  WBC 18.7* 13.3* 17.2*  --  20.5* 22.5*  HGB 9.2* 7.8* 8.3* 8.8* 8.7* 8.2*  HCT 28.0* 23.4* 24.8* 26.0* 25.7* 24.7*  MCV 98.2 97.1 94.7  --  94.1 93.6  PLT 322 267 255  --  265 604   Basic Metabolic Panel: Recent Labs  Lab 04/03/21 0113 04/04/21 0234 04/05/21 0155 04/05/21 0843 04/06/21 0514 04/07/21 0307  NA 137 140 142 143 144 143  K 3.9 4.0 3.5 3.8 3.7 3.8  CL 108 108 111  --  109 104  CO2 18* 21* 21*  --  23 26  GLUCOSE 129* 193* 186*  --  191* 277*  BUN 66* 71* 69*  --  63* 56*  CREATININE 2.49* 2.72* 2.46*  --  2.09* 1.81*  CALCIUM 7.7* 7.6* 8.2*  --  8.6* 8.3*  MG 2.5* 2.2 2.5*  --  2.2 1.8  PHOS 4.3 3.8 3.4  --  2.4* 2.5   GFR: Estimated Creatinine Clearance: 41.2 mL/min (A) (by C-G formula based on SCr of 1.81 mg/dL (H)). Liver Function Tests: Recent Labs  Lab 04/03/21 0113 04/04/21 0234  AST 73* 65*  ALT 42 62*  ALKPHOS 219* 146*  BILITOT 3.9* 3.2*  PROT 4.9* 5.2*  ALBUMIN 1.8* 1.6*   No results for input(s): LIPASE, AMYLASE in the last 168 hours. No results for input(s): AMMONIA in the  last 168 hours. Coagulation Profile: No results for input(s): INR, PROTIME in the last 168 hours. Cardiac Enzymes: No results for input(s): CKTOTAL, CKMB, CKMBINDEX, TROPONINI in the last 168 hours. BNP (last 3 results) No results for input(s): PROBNP in the last 8760 hours. HbA1C: No results for input(s): HGBA1C in the last 72 hours. CBG: Recent Labs  Lab 04/06/21 1634 04/06/21 1947 04/06/21 2300 04/07/21 0402 04/07/21 0818  GLUCAP 206* 189* 181* 202* 215*   Lipid Profile: Recent Labs    04/05/21 0155 04/06/21 0514  TRIG 351* 335*   Thyroid Function Tests: No results for input(s): TSH, T4TOTAL, FREET4, T3FREE, THYROIDAB in the last 72 hours. Anemia Panel: No results for input(s): VITAMINB12, FOLATE, FERRITIN, TIBC, IRON, RETICCTPCT in the last 72 hours. Sepsis Labs: Recent Labs  Lab 03/31/21 1125 04/07/21 0930  PROCALCITON  --  2.05  LATICACIDVEN 0.8  --     No results found for this or any previous visit (from the past 240 hour(s)).  Radiology Studies: DG CHEST PORT 1 VIEW  Result Date: 04/06/2021 CLINICAL DATA:  61 year old female with history of left-sided pleural effusion status post surgery. EXAM: PORTABLE CHEST 1 VIEW COMPARISON:  Chest x-ray 04/05/2021. FINDINGS: Previously noted enteric tube has been removed. There is a right upper extremity PICC with tip terminating in the mid superior vena cava. Lung volumes are low. There are bibasilar opacities which may reflect areas of atelectasis and/or consolidation. Small left pleural effusion. No definite right pleural effusion. No pneumothorax. No evidence of pulmonary edema. Heart size is mildly enlarged. Upper mediastinal contours are remarkable for prominence of the thoracic aorta, similar to the prior study, compatible with known aortic aneurysm/dissection better demonstrated on recent chest CTA 03/24/2021. Status post median sternotomy for CABG. IMPRESSION: 1. Postoperative changes and support apparatus,  as above. 2. Low lung volumes with bibasilar areas of atelectasis and/or consolidation and small left pleural effusion. 3. Known aortic aneurysm/dissection grossly similar in appearance to prior examinations, as above. Please see prior chest CT 03/24/2021 for full description. Electronically Signed   By: Vinnie Langton M.D.   On: 04/06/2021 11:01        Scheduled Meds:  amiodarone  200 mg Oral BID   amLODipine  10 mg Oral Daily   aspirin  324 mg Oral Daily   chlorhexidine  15 mL Mouth Rinse BID   Chlorhexidine Gluconate Cloth  6 each Topical Daily   fluconazole  200 mg Oral QHS   furosemide  40 mg Intravenous Q12H   heparin  5,000 Units Subcutaneous Q8H   insulin aspart  0-24 Units Subcutaneous Q4H   ipratropium-albuterol  3 mL Nebulization Q4H   mouth rinse  15 mL Mouth Rinse q12n4p   metoCLOPramide (REGLAN) injection  5 mg Intravenous Q8H   pantoprazole  40 mg Oral Daily   sodium chloride flush  10-40 mL Intracatheter Q12H   Continuous Infusions:  lactated ringers 55 mL/hr at 04/06/21 2100   piperacillin-tazobactam (ZOSYN)  IV 3.375 g (04/07/21 0524)   potassium PHOSPHATE IVPB (in mmol) 30 mmol (04/07/21 1118)   TPN ADULT (ION) 90 mL/hr at 04/06/21 1811   TPN ADULT (ION)     vancomycin 1,250 mg (04/06/21 1800)     LOS: 14 days    Time spent: 35 minutes    Sharen Hones, MD [facility]   To contact the attending provider between 7A-7P or the covering provider during after hours 7P-7A, please log into the web site [email or web address] and access using universal [place] password for that web site. If you do not have the password, please call the hospital operator.  04/07/2021, 11:18 AM

## 2021-04-08 ENCOUNTER — Inpatient Hospital Stay (HOSPITAL_COMMUNITY): Payer: PRIVATE HEALTH INSURANCE

## 2021-04-08 LAB — COMPREHENSIVE METABOLIC PANEL
ALT: 38 U/L (ref 0–44)
AST: 37 U/L (ref 15–41)
Albumin: 1.9 g/dL — ABNORMAL LOW (ref 3.5–5.0)
Alkaline Phosphatase: 131 U/L — ABNORMAL HIGH (ref 38–126)
Anion gap: 10 (ref 5–15)
BUN: 49 mg/dL — ABNORMAL HIGH (ref 6–20)
CO2: 30 mmol/L (ref 22–32)
Calcium: 8.4 mg/dL — ABNORMAL LOW (ref 8.9–10.3)
Chloride: 102 mmol/L (ref 98–111)
Creatinine, Ser: 1.83 mg/dL — ABNORMAL HIGH (ref 0.44–1.00)
GFR, Estimated: 31 mL/min — ABNORMAL LOW (ref >60)
Glucose, Bld: 130 mg/dL — ABNORMAL HIGH (ref 70–99)
Potassium: 4.2 mmol/L (ref 3.5–5.1)
Sodium: 142 mmol/L (ref 135–145)
Total Bilirubin: 2.4 mg/dL — ABNORMAL HIGH (ref 0.3–1.2)
Total Protein: 6.1 g/dL — ABNORMAL LOW (ref 6.5–8.1)

## 2021-04-08 LAB — GLUCOSE, CAPILLARY
Glucose-Capillary: 116 mg/dL — ABNORMAL HIGH (ref 70–99)
Glucose-Capillary: 126 mg/dL — ABNORMAL HIGH (ref 70–99)
Glucose-Capillary: 134 mg/dL — ABNORMAL HIGH (ref 70–99)
Glucose-Capillary: 137 mg/dL — ABNORMAL HIGH (ref 70–99)
Glucose-Capillary: 142 mg/dL — ABNORMAL HIGH (ref 70–99)

## 2021-04-08 LAB — CBC
HCT: 24.5 % — ABNORMAL LOW (ref 36.0–46.0)
Hemoglobin: 8.1 g/dL — ABNORMAL LOW (ref 12.0–15.0)
MCH: 30.7 pg (ref 26.0–34.0)
MCHC: 33.1 g/dL (ref 30.0–36.0)
MCV: 92.8 fL (ref 80.0–100.0)
Platelets: 304 10*3/uL (ref 150–400)
RBC: 2.64 MIL/uL — ABNORMAL LOW (ref 3.87–5.11)
RDW: 18.8 % — ABNORMAL HIGH (ref 11.5–15.5)
WBC: 23.1 10*3/uL — ABNORMAL HIGH (ref 4.0–10.5)
nRBC: 0.3 % — ABNORMAL HIGH (ref 0.0–0.2)

## 2021-04-08 LAB — PHOSPHORUS: Phosphorus: 3.7 mg/dL (ref 2.5–4.6)

## 2021-04-08 LAB — TRIGLYCERIDES: Triglycerides: 237 mg/dL — ABNORMAL HIGH (ref <150)

## 2021-04-08 LAB — MAGNESIUM: Magnesium: 1.6 mg/dL — ABNORMAL LOW (ref 1.7–2.4)

## 2021-04-08 MED ORDER — IPRATROPIUM-ALBUTEROL 0.5-2.5 (3) MG/3ML IN SOLN
3.0000 mL | Freq: Two times a day (BID) | RESPIRATORY_TRACT | Status: DC
  Administered 2021-04-09 – 2021-04-13 (×10): 3 mL via RESPIRATORY_TRACT
  Filled 2021-04-08 (×11): qty 3

## 2021-04-08 MED ORDER — INSULIN ASPART 100 UNIT/ML IJ SOLN
0.0000 [IU] | Freq: Four times a day (QID) | INTRAMUSCULAR | Status: DC
  Administered 2021-04-08 – 2021-04-09 (×2): 2 [IU] via SUBCUTANEOUS
  Administered 2021-04-09: 4 [IU] via SUBCUTANEOUS
  Administered 2021-04-09 (×2): 2 [IU] via SUBCUTANEOUS
  Administered 2021-04-09: 4 [IU] via SUBCUTANEOUS
  Administered 2021-04-10: 2 [IU] via SUBCUTANEOUS
  Administered 2021-04-10: 4 [IU] via SUBCUTANEOUS
  Administered 2021-04-11 (×2): 2 [IU] via SUBCUTANEOUS
  Administered 2021-04-11: 4 [IU] via SUBCUTANEOUS
  Administered 2021-04-12 (×2): 2 [IU] via SUBCUTANEOUS
  Administered 2021-04-13: 4 [IU] via SUBCUTANEOUS
  Administered 2021-04-14 (×2): 2 [IU] via SUBCUTANEOUS

## 2021-04-08 MED ORDER — MAGNESIUM SULFATE 2 GM/50ML IV SOLN
2.0000 g | Freq: Once | INTRAVENOUS | Status: AC
  Administered 2021-04-08: 2 g via INTRAVENOUS
  Filled 2021-04-08: qty 50

## 2021-04-08 MED ORDER — FUROSEMIDE 10 MG/ML IJ SOLN
40.0000 mg | Freq: Two times a day (BID) | INTRAMUSCULAR | Status: DC
  Administered 2021-04-08 – 2021-04-10 (×5): 40 mg via INTRAVENOUS
  Filled 2021-04-08 (×5): qty 4

## 2021-04-08 MED ORDER — GLUCERNA SHAKE PO LIQD
237.0000 mL | Freq: Three times a day (TID) | ORAL | Status: DC
  Administered 2021-04-08 – 2021-04-14 (×7): 237 mL via ORAL

## 2021-04-08 MED ORDER — VANCOMYCIN HCL 1500 MG/300ML IV SOLN
1500.0000 mg | INTRAVENOUS | Status: DC
  Administered 2021-04-08: 1500 mg via INTRAVENOUS
  Filled 2021-04-08: qty 300

## 2021-04-08 MED ORDER — IOHEXOL 9 MG/ML PO SOLN
ORAL | Status: AC
  Administered 2021-04-08: 500 mL
  Filled 2021-04-08: qty 1000

## 2021-04-08 MED ORDER — IPRATROPIUM-ALBUTEROL 0.5-2.5 (3) MG/3ML IN SOLN
3.0000 mL | RESPIRATORY_TRACT | Status: DC | PRN
  Filled 2021-04-08: qty 3

## 2021-04-08 MED ORDER — TRACE MINERALS CU-MN-SE-ZN 300-55-60-3000 MCG/ML IV SOLN: INTRAVENOUS | Status: DC

## 2021-04-08 MED ORDER — TRAVASOL 10 % IV SOLN
INTRAVENOUS | Status: AC
  Filled 2021-04-08: qty 805.2

## 2021-04-08 NOTE — Progress Notes (Signed)
PT Cancellation Note  Patient Details Name: Dorothy Harris MRN: [id number] DOB: 02-09-1961   Cancelled Treatment:    Reason Eval/Treat Not Completed: Medical issues which prohibited therapy; patient with issues of bleeding from abdomen per RN.  Will cancel for today and attempt again another day.   Reginia Naas 04/08/2021, 3:18 PM Magda Kiel, PT Acute Rehabilitation Services Pager:[phone] [phone] 04/08/2021

## 2021-04-08 NOTE — Progress Notes (Addendum)
6 Days Post-Op   Subjective/Chief Complaint: Denied abdominal pain, main complaint is dry nose from the oxygen and feeling like she cannot breathe   Objective: Vital signs in last 24 hours: Temp:  [97.6 F (36.4 C)-99.1 F (37.3 C)] 98.5 F (36.9 C) (01/02 0335) Pulse Rate:  [93-98] 98 (01/02 0837) Resp:  [19-32] 19 (01/02 0837) BP: (137-154)/(69-80) 137/77 (01/02 0335) SpO2:  [93 %-100 %] 93 % (01/02 0842) Weight:  [105 kg] 105 kg (01/02 0500) Last BM Date: 04/05/21  Intake/Output from previous day: 01/01 0701 - 01/02 0700 In: 4329.7 [P.O.:1095; I.V.:2497.8; IV Piggyback:736.9] Out: 5245 [Urine:4800; Drains:320; Stool:125] Intake/Output this shift: No intake/output data recorded.  Exam: Awake and alert Looks a little SOB Abdomen obese, a little distended, ostomy pink with liquid stool Drains are both serosang without purulence Vac in place  Lab Results:  Recent Labs    04/07/21 0307 04/08/21 0546  WBC 22.5* 23.1*  HGB 8.2* 8.1*  HCT 24.7* 24.5*  PLT 281 304    BMET Recent Labs    04/07/21 0307 04/08/21 0546  NA 143 142  K 3.8 4.2  CL 104 102  CO2 26 30  GLUCOSE 277* 130*  BUN 56* 49*  CREATININE 1.81* 1.83*  CALCIUM 8.3* 8.4*    PT/INR No results for input(s): LABPROT, INR in the last 72 hours. ABG No results for input(s): PHART, HCO3 in the last 72 hours.  Invalid input(s): PCO2, PO2   Studies/Results: VAS [place] UPPER EXTREMITY VENOUS DUPLEX  Result Date: 04/07/2021 UPPER VENOUS STUDY  Patient Name:  Dorothy Harris [facility]  Date of Exam:   04/07/2021 Medical Rec #: [id number]      Accession #:    [id number] Date of Birth: 61 years      Patient Gender: F Patient Age:   61 years Exam Location:  [facility] Procedure:      VAS [place] UPPER EXTREMITY VENOUS DUPLEX Referring Phys: Patrick Jupiter GOLD --------------------------------------------------------------------------------  Indications: Swelling Comparison Study: swelling Performing Technologist: Archie Patten  RVS  Examination Guidelines: A complete evaluation includes B-mode imaging, spectral Doppler, color Doppler, and power Doppler as needed of all accessible portions of each vessel. Bilateral testing is considered an integral part of a complete examination. Limited examinations for reoccurring indications may be performed as noted.  Right Findings: +----------+------------+---------+-----------+----------+-------+  RIGHT      Compressible Phasicity Spontaneous Properties Summary  +----------+------------+---------+-----------+----------+-------+  Subclavian                 Yes        Yes                         +----------+------------+---------+-----------+----------+-------+  Left Findings: +----------+------------+---------+-----------+----------+-------+  LEFT       Compressible Phasicity Spontaneous Properties Summary  +----------+------------+---------+-----------+----------+-------+  IJV            Full        Yes        Yes                         +----------+------------+---------+-----------+----------+-------+  Subclavian     Full        Yes        Yes                         +----------+------------+---------+-----------+----------+-------+  Axillary       Full  Yes        Yes                         +----------+------------+---------+-----------+----------+-------+  Brachial       Full        Yes        Yes                         +----------+------------+---------+-----------+----------+-------+  Radial         Full                                               +----------+------------+---------+-----------+----------+-------+  Ulnar          Full                                               +----------+------------+---------+-----------+----------+-------+  Cephalic       Full                                               +----------+------------+---------+-----------+----------+-------+  Basilic        Full                                                +----------+------------+---------+-----------+----------+-------+  Summary:  Right: No evidence of thrombosis in the subclavian.  Left: No evidence of deep vein thrombosis in the upper extremity. No evidence of superficial vein thrombosis in the upper extremity.  *See table(s) above for measurements and observations.  Diagnosing physician: Monica Martinez MD Electronically signed by Monica Martinez MD on 04/07/2021 at 11:35:37 AM.    Final     Anti-infectives: Anti-infectives (From admission, onward)    Start     Dose/Rate Route Frequency Ordered Stop   04/08/21 1800  vancomycin (VANCOREADY) IVPB 1500 mg/300 mL        1,500 mg 150 mL/hr over 120 Minutes Intravenous Every 48 hours 04/08/21 0751     04/06/21 2200  fluconazole (DIFLUCAN) tablet 200 mg        200 mg Oral Daily at bedtime 04/06/21 1232     04/04/21 1800  vancomycin (VANCOREADY) IVPB 1250 mg/250 mL  Status:  Discontinued        1,250 mg 166.7 mL/hr over 90 Minutes Intravenous Every 48 hours 04/03/21 1501 04/08/21 0751   04/03/21 0600  piperacillin-tazobactam (ZOSYN) IVPB 3.375 g        3.375 g 12.5 mL/hr over 240 Minutes Intravenous On call to O.R. 04/02/21 1603 04/03/21 0916   04/03/21 0200  piperacillin-tazobactam (ZOSYN) IVPB 3.375 g        3.375 g 12.5 mL/hr over 240 Minutes Intravenous Every 8 hours 04/02/21 2143     04/02/21 2245  fluconazole (DIFLUCAN) IVPB 200 mg  Status:  Discontinued        200 mg 100 mL/hr over 60 Minutes Intravenous Every 24 hours 04/02/21 2147 04/06/21 1231   04/02/21  2245  vancomycin (VANCOREADY) IVPB 2000 mg/400 mL        2,000 mg 200 mL/hr over 120 Minutes Intravenous  Once 04/02/21 2158 04/03/21 0142   04/02/21 2158  vancomycin variable dose per unstable renal function (pharmacist dosing)  Status:  Discontinued         Does not apply See admin instructions 04/02/21 2159 04/03/21 1501   03/25/21 1200  vancomycin (VANCOCIN) IVPB 1000 mg/200 mL premix        1,000 mg 200 mL/hr over 60 Minutes  Intravenous  Once 03/25/21 0512 03/25/21 1206   03/25/21 0600  ceFAZolin (ANCEF) IVPB 2g/100 mL premix        2 g 200 mL/hr over 30 Minutes Intravenous Every 8 hours 03/25/21 0512 03/26/21 2217   03/24/21 1800  ceFAZolin (ANCEF) IVPB 2g/100 mL premix        2 g 200 mL/hr over 30 Minutes Intravenous To Surgery 03/24/21 1738 03/25/21 0308   03/24/21 1800  ceFAZolin (ANCEF) IVPB 2g/100 mL premix  Status:  Discontinued        2 g 200 mL/hr over 30 Minutes Intravenous To Surgery 03/24/21 1734 03/25/21 0512   03/24/21 1800  vancomycin (VANCOREADY) IVPB 1250 mg/250 mL        1,250 mg 166.7 mL/hr over 90 Minutes Intravenous To Surgery 03/24/21 1738 03/24/21 2114       Assessment/Plan: Chronically incarcerated ventral hernia Bowel perforation -POD #6 s/p ex lap, adhesiolysis, extended R hemicolectomy, primary incisional hernia repair, ileostomy creation, abdominal washout, drain placement x2, incisional wound vac placement 30x8x4cm - Dr. Bobbye Morton 04/02/21  WBC up more today.  Likely pulmonary but can't rule out intra-abdominal source despite normal fluid in both drains. Will proceed with CT of the chest abdomen pelvis with p.o. contrast only.   Addendum 14:40- called to bedside with concern of bleeding from wound. Her vac was changed earlier today. Vac removed, punctate oozing noted from dermal edge of wound on the left superior area. Pressure held. Damp to dry dressing for now.   Clovis Riley MD 04/08/2021

## 2021-04-08 NOTE — Progress Notes (Signed)
Pharmacy Antibiotic Note  Dorothy Harris is a 61 y.o. female admitted on 03/24/2021 with  IAI .  Pharmacy has been consulted for Vanco, Zosyn dosing.   ID: WBC 23.1 still rising. Tmax 99.1. Needs re-CT abdomen with increasing WBC.  Zosyn 12/27 >  Vancomycin 12/27 >  Fluconazole 12/27 >   12/19: MRSA PCR: neg  Plan: Increase Vancomycin 1500 mg IV Q 48 hrs. Goal AUC 400-550. Expected AUC: 515 SCr used: 1.83 Zosyn 3.375g IV q8hr Fluconazole $RemoveBeforeDE'200mg'uhlmQbLyfLizREV$ /hs (50% usual dosage with CrCl<50)   Height: $Remove'5\' 7"'cpQxLDP$  (170.2 cm) Weight: 105 kg (231 lb 7.7 oz) IBW/kg (Calculated) : 61.6  Temp (24hrs), Avg:98.4 F (36.9 C), Min:97.6 F (36.4 C), Max:99.1 F (37.3 C)  Recent Labs  Lab 04/04/21 0234 04/05/21 0155 04/06/21 0514 04/07/21 0307 04/08/21 0546  WBC 13.3* 17.2* 20.5* 22.5* 23.1*  CREATININE 2.72* 2.46* 2.09* 1.81* 1.83*    Estimated Creatinine Clearance: 40.8 mL/min (A) (by C-G formula based on SCr of 1.83 mg/dL (H)).    No Known Allergies  Felecia Stanfill S. Alford Highland, PharmD, BCPS Clinical Staff Pharmacist [email or web address] [place] 04/08/2021 7:53 AM

## 2021-04-08 NOTE — Consult Note (Signed)
[place] Nurse ostomy follow up Stoma type/location: ileostomy; RLQ Stomal assessment/size: 1 1/4" round, pink, budded, os at 7 o'clock  Peristomal assessment: intact  Treatment options for stomal/peristomal skin: 2" skin barrier ring Output liquid green Ostomy pouching: 2pc. 2 3/4" with 2" skin barrier ring  Education provided:  Explained role of ostomy nurse and creation of stoma  Explained stoma characteristics (budded, flush, color, texture, care) Demonstrated pouch change (cutting new skin barrier, measuring stoma, cleaning peristomal skin and stoma, use of barrier ring) Education on emptying when 1/3 to 1/2 full and how to empty Patient will be living alone, she has a sister she reports that will be a support person when DC however the sister is not able to come in during the work week for ostomy teaching. Will explore options for video of teaching for sister possibly.   Supplies in the room; 2 3/4" ostomy pouching and NPWT dressing kits    Enrolled patient in [place] Discharge program: Yes  [place] Nurse wound follow up Wound type: surgical  Measurement:20cmx 5cm x 3cm  Wound bed:100% clean, subcutaneous tissue Drainage (amount, consistency, odor) minimal; serosanguinous in VAC canister Periwound: intact  Dressing procedure/placement/frequency: Removed old NPWT dressing Filled wound with  _1__ piece of black foam Sealed NPWT dressing at 182mm HG Patient received IV pain medication per bedside nurse prior to dressing change Patient tolerated procedure well  WOC nurse will continue to provide NPWT dressing changed due to the complexity of the dressing change.       [place] Nurse will follow along with you for continued support with ostomy teaching and care as well as NPWT dressing change M/W/F Rena Sweeden Liane Comber MSN, RN, [employer], [place], [place]

## 2021-04-08 NOTE — Progress Notes (Addendum)
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   Patient Measurements: Height: 5\' 7"  (170.2 cm) Weight: 105 kg (231 lb 7.7 oz) IBW/kg (Calculated) : 61.6 TPN AdjBW (KG): 66 Body mass index is 36.26 kg/m. 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 Patient was then found to have a chronically incarcerated hernia with likely ileus. S/p exploratory laparotomy, adhesiolysis x22min, extended right hemicolectomy, primary incisional hernia repair, and ileostomy creation 12/27. Patient has had very minimal PO intake since 12/19 surgery and anticipate cannot start tube feeds for some time after abdominal surgery. Surgery anticipates post-op ileus. Patient is at risk for refeeding. Pharmacy consulted for TPN.  Glucose / Insulin: no hx DM but A1C 5.9, CBGs 116-277, used 26 units insulin/24h + 20 units in TPN  - likely elevated due to TPN + increased po intake Electrolytes: K 4.2 (goal >=4), CoCa:10, Phos 3.5 (Kphox 30 mmol),  Mg 1.6 (2g IV ordered, goal >=2)  Renal: Scr 1.83 (BL ~2), BUN 49 down Hepatic: LFT wnl, Tbili 2.4 down, Albumin 1.9, TG 237 Intake / Output; MIVF:  LR 31ml/hr, FLD: 1029ml Ouput: UOP 1.9 ml/kg/hr on furosemide IV 40 Q 8hr, Drains 320 ml, ileostomy 125 ml  GI Imaging: 12/18 CT abd: no findings 12/24 KUB: Gas within nondistended small bowel and mildly prominent right colon 12/25 KUB: gas in small bowel loops may suggest ileus 12/27 KUB: Increased gaseous distention of the right colon which could be due to postoperative ileus or colonic obstruction  12/27 CT abd: Large amount of free intraperitoneal air compatible with a bowel perforation; dilatation of the  ascending colon and cecum indicating mechanical obstruction; descending colon is relatively decompressed indicating complete or high-grade partial obstruction   GI Surgeries / Procedures:  12/27:  exploratory laparotomy, adhesiolysis x61min, extended right hemicolectomy, primary incisional hernia repair, and ileostomy creation  Central access: PICC 12/27 TPN start date: 12/28   Nutritional Goals: Goal TPN rate is 90 mL/hr (provides 131 g of protein, 259g dextrose, 60g lipids, and 2012 kcals per day)  RD Assessment: Estimated Needs Total Energy Estimated Needs: 1900-2100 kcals Total Protein Estimated Needs: 120-135g Total Fluid Estimated Needs: >/= 1.9 L   Current Nutrition:  TPN 12/31: FLD 1/1: FLD - only ate 2 containers of peaches and some juice. Does not like Boost because it's too sweet  Plan:  Slightly Increase TPN to 60 mL/hr at 1800, provides 81 g protein and 1231 Kcal, meeting ~60% of estimated needs  Electrolytes in TPN: Increase K 45 mEq/L, Ca 4 mEq/L, Phos 15 mmol/L; Continue Na 100 mEq/L, Mg 4 mEq/L, Cl:Ac 1:1 Add standard MVI and trace elements to TPN Decrease SSI q Q6 hr and adjust as needed Increase insulin to 35 units in TPN   Mag 2g IV x 1 today Monitor TPN labs Mon/Thurs and PRN Watch elevated TG    ADDENDUM: TPN order Dc'd by MD at 10:43 but after discussion, ok to continue current TPN and hang TPN bag at 18:00 as discussed with surgery.   Benetta Spar, PharmD, BCPS, BCCP Clinical Pharmacist  Please check AMION for all [place] phone numbers After 10:00 PM, call [place] [phone]

## 2021-04-08 NOTE — Progress Notes (Addendum)
[address]       [employer] [address]             [phone]      6 Days Post-Op Procedure(s) (LRB): EXPLORATORY LAPAROTOMY (N/A) EXTENDED COLON RESECTION ILEOSTOMY (N/A) HERNIA REPAIR VENTRAL ADULT LYSIS OF ADHESION, EXTENSIVE (N/A) Subjective: Feels a little better, breathing a bit more comfortably   Objective: Vital signs in last 24 hours: Temp:  [97.6 F (36.4 C)-99.1 F (37.3 C)] 98.5 F (36.9 C) (01/02 0335) Pulse Rate:  [93-101] 93 (01/01 1933) Cardiac Rhythm: Normal sinus rhythm (01/01 1900) Resp:  [24-32] 30 (01/02 0002) BP: (137-169)/(69-87) 137/77 (01/02 0335) SpO2:  [94 %-100 %] 100 % (01/01 1933) Weight:  [105 kg] 105 kg (01/02 0500)  Hemodynamic parameters for last 24 hours:    Intake/Output from previous day: 01/01 0701 - 01/02 0700 In: 4329.7 [P.O.:1095; I.V.:2497.8; IV Piggyback:736.9] Out: 5245 [Urine:4800; Drains:320; Stool:125] Intake/Output this shift: No intake/output data recorded.  General appearance: alert, cooperative, fatigued, and no distress Heart: regular rate and rhythm Lungs: less pseudo-wheeze/congestion Abdomen: bileous liquid drainage in ostomy bag, some + BS, mild tenderness Extremities: no LE edema or calf tenderness Wound: VAC in place, incis all appear stable  Lab Results: Recent Labs    04/07/21 0307 04/08/21 0546  WBC 22.5* 23.1*  HGB 8.2* 8.1*  HCT 24.7* 24.5*  PLT 281 304   BMET:  Recent Labs    04/07/21 0307 04/08/21 0546  NA 143 142  K 3.8 4.2  CL 104 102  CO2 26 30  GLUCOSE 277* 130*  BUN 56* 49*  CREATININE 1.81* 1.83*  CALCIUM 8.3* 8.4*    PT/INR: No results for input(s): LABPROT, INR in the last 72 hours. ABG    Component Value Date/Time   PHART 7.327 (L) 04/05/2021 0843   HCO3 21.9 04/05/2021 0843   TCO2 23 04/05/2021 0843   ACIDBASEDEF 4.0 (H) 04/05/2021 0843   O2SAT 92.0 04/05/2021 0843   CBG (last 3)  Recent Labs    04/07/21 2000 04/08/21 0008 04/08/21 0344   GLUCAP 175* 134* 116*    Meds Scheduled Meds:  amiodarone  200 mg Oral BID   amLODipine  10 mg Oral Daily   aspirin  324 mg Oral Daily   chlorhexidine  15 mL Mouth Rinse BID   Chlorhexidine Gluconate Cloth  6 each Topical Daily   fluconazole  200 mg Oral QHS   furosemide  40 mg Intravenous Q8H   heparin  5,000 Units Subcutaneous Q8H   insulin aspart  0-24 Units Subcutaneous Q4H   ipratropium-albuterol  3 mL Nebulization Q4H   mouth rinse  15 mL Mouth Rinse q12n4p   pantoprazole  40 mg Oral Daily   sodium chloride flush  10-40 mL Intracatheter Q12H   Continuous Infusions:  lactated ringers 55 mL/hr at 04/07/21 1523   piperacillin-tazobactam (ZOSYN)  IV 3.375 g (04/08/21 0558)   TPN ADULT (ION) 45 mL/hr at 04/07/21 1758   vancomycin Stopped (04/06/21 1931)   PRN Meds:.hydrALAZINE, LORazepam, morphine injection, ondansetron (ZOFRAN) IV, sodium chloride flush  Xrays VAS [place] UPPER EXTREMITY VENOUS DUPLEX  Result Date: 04/07/2021 UPPER VENOUS STUDY  Patient Name:  Dorothy Harris [facility]  Date of Exam:   04/07/2021 Medical Rec #: [id number]      Accession #:    [id number] Date of Birth: September 13, 1960      Patient Gender: F Patient Age:   61 years Exam Location:  [facility]  Procedure:      VAS [place] UPPER EXTREMITY VENOUS DUPLEX Referring Phys: WAYNE GOLD --------------------------------------------------------------------------------  Indications: Swelling Comparison Study: swelling Performing Technologist: [place] Ponder RVS  Examination Guidelines: A complete evaluation includes B-mode imaging, spectral Doppler, color Doppler, and power Doppler as needed of all accessible portions of each vessel. Bilateral testing is considered an integral part of a complete examination. Limited examinations for reoccurring indications may be performed as noted.  Right Findings: +----------+------------+---------+-----------+----------+-------+  RIGHT      Compressible Phasicity Spontaneous Properties Summary   +----------+------------+---------+-----------+----------+-------+  Subclavian                 Yes        Yes                         +----------+------------+---------+-----------+----------+-------+  Left Findings: +----------+------------+---------+-----------+----------+-------+  LEFT       Compressible Phasicity Spontaneous Properties Summary  +----------+------------+---------+-----------+----------+-------+  IJV            Full        Yes        Yes                         +----------+------------+---------+-----------+----------+-------+  Subclavian     Full        Yes        Yes                         +----------+------------+---------+-----------+----------+-------+  Axillary       Full        Yes        Yes                         +----------+------------+---------+-----------+----------+-------+  Brachial       Full        Yes        Yes                         +----------+------------+---------+-----------+----------+-------+  Radial         Full                                               +----------+------------+---------+-----------+----------+-------+  Ulnar          Full                                               +----------+------------+---------+-----------+----------+-------+  Cephalic       Full                                               +----------+------------+---------+-----------+----------+-------+  Basilic        Full                                               +----------+------------+---------+-----------+----------+-------+  Summary:  Right: No evidence of thrombosis in the subclavian.  Left: No evidence of deep vein thrombosis in the upper extremity. No evidence of superficial vein thrombosis in the upper extremity.  *See table(s) above for measurements and observations.  Diagnosing physician: Monica Martinez MD Electronically signed by Monica Martinez MD on 04/07/2021 at 11:35:37 AM.    Final     Assessment/Plan: S/P Procedure(s) (LRB): EXPLORATORY LAPAROTOMY  (N/A) EXTENDED COLON RESECTION ILEOSTOMY (N/A) HERNIA REPAIR VENTRAL ADULT LYSIS OF ADHESION, EXTENSIVE (N/A)  1 Tmax 99.1, S BP 130's-160's, HR 90's-100's, sinus rhythm 2 sats ok on 4 liters- duoneb started yesterday- breathing more comfortably today 3 Venous duplex neg for UA DVT 4 excellent UOP- creat stable at 1.8, BNP signif elevated and medicine placed on IV lasix 40 q 8 5 JP drains 320 cc/24 h 6 some stool in ileostomy bag 7 WBC some upward trend conts,WBC 23.1 today- conts current abx, poss CT scan per gen surgery 8 H/H stable, close to transfusion threshold 9 on TNA- rate decreased and insulin not added/full liquids, reglan added yesterday 10 cont SSI for BS, some over 200 11 will repeat CXR in am to see progression 12 SQ hep for DVT PPX currently     LOS: 15 days    John Giovanni PA-C Pager [phone] 04/08/2021   Patient seen and examined, agree with above WBC remains elevated on Vanco and Zosyn  Itzell Bendavid C. Roxan Hockey, MD [facility] [phone]

## 2021-04-08 NOTE — Progress Notes (Addendum)
PROGRESS NOTE    Dorothy Harris  QBT:[id number] DOB: 08-22-1960 DOA: 03/24/2021 PCP: No primary care provider on file.   Chief complaint.  Shortness of breath. Brief Narrative:  Patient has history of uncontrolled hypertension, smoker, acute type B aortic dissection in 2019, aneurysmal dilatation of proximal descending aorta 2019 and lost follow-up presented to [facility], ER on 12/18 with chest pain and shortness of breath.  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  Return to surgical ICU postop, subsequently developed abdominal pain needing to go back to operating room on 12/27.  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  Extended right hemicolectomy and ileostomy. Transferred to medical floor 12/30 on amiodarone drip, TPN. 1/1.  Patient has significant short of breath and wheezing, elevated BNP, started on IV Lasix.   Assessment & Plan:   Principal Problem:   Status post surgery Active Problems:   Acute on chronic diastolic CHF (congestive heart failure) (HCC)   S/P ascending aortic replacement   Encounter for weaning from ventilator [facility])   Encounter for postanesthesia care   Peritonitis (HCC)   Endotracheally intubated   Acute renal failure superimposed on stage 3a chronic kidney disease (HCC)   Malnutrition of moderate degree   Acute hypoxemic respiratory failure (HCC)  Acute on chronic diastolic congestive heart failure. Acute hypoxemic respiratory failure secondary to congestive heart failure. Patient was giving IV Lasix overnight, she is also receiving TPN. Her respiratory status is better today, will try to wean off oxygen.  Continue IV Lasix, but reduce to 40 mg IV twice a day as TPN will be reduced in dose.  Monitor renal function closely.    Ascending aortic dissection with rupture status post surgical replacement of aortic arch. Followed by vascular surgery.  Bacterial peritonitis secondary to ruptured viscus. Patient has tolerated full liquid diet well, confirmed with nurse. I will add Glucerna, discontinue TPN for now. Still on Zosyn and vancomycin.   Acute kidney injury on chronic kidney disease stage IIIa. Hypomagnesemia. Continue monitor renal function with new diuretics with Lasix. Replete magnesium.  Persistent atrial fibrillation with RVR. Heart rate better controlled.   Anemia of chronic disease. Iron B12 level adequate.  Morbid obesity. Moderate protein calorie malnutrition. Patient has a BMI of 36.26 with multiple cardiac conditions. Patient also has significant lower albumin with poor p.o. intake. Will start Glucerna   DVT prophylaxis: SQ heparin Code Status: Full Family Communication:  Disposition Plan:      Status is: Inpatient   Remains inpatient appropriate because: Severity of disease.  IV antibiotics   I/O last 3 completed shifts: In: 6610.1 [P.O.:1335; I.V.:4438.2; IV Piggyback:836.9] Out: 6495 [Urine:5825; Drains:545; Stool:125] Total I/O In: -  Out: 1350 [Urine:1200; Stool:150]     Consultants:  CT surgery, general surgery.  Procedures:   Antimicrobials: Zosyn.  Vancomycin.  Subjective: Patient still on 4 L oxygen, but feel better.  Less short of breath, less wheezing. Cough, nonproductive. No abdominal pain or nausea vomiting, tolerating full liquid diet well. No dysuria hematuria  No fever or chills.   Objective: Vitals:   04/08/21 0500 04/08/21 0837 04/08/21 0842 04/08/21 0907  BP:    (!) 141/84  Pulse:  98  95  Resp:  19  20  Temp:    98.7 F (37.1 C)  TempSrc:  Oral  SpO2:  93% 93% 94%  Weight: 105 kg     Height:        Intake/Output Summary (Last 24 hours) at 04/08/2021 1044 Last data filed at 04/08/2021 1000 Gross per 24 hour  Intake 4154.73 ml   Output 5745 ml  Net -1590.27 ml   Filed Weights   04/06/21 0346 04/07/21 0357 04/08/21 0500  Weight: 105.2 kg 105 kg 105 kg    Examination:  General exam: Appears calm and comfortable  Respiratory system: Still has some crackles in the base. Respiratory effort normal. Cardiovascular system: S1 & S2 heard, RRR. No JVD, murmurs, rubs, gallops or clicks. No pedal edema. Gastrointestinal system: Abdomen is nondistended, soft and nontender. No organomegaly or masses felt. Normal bowel sounds heard. Central nervous system: Alert and oriented. No focal neurological deficits. Extremities: Symmetric 5 x 5 power. Skin: No rashes, lesions or ulcers Psychiatry: Judgement and insight appear normal. Mood & affect appropriate.     Data Reviewed: I have personally reviewed following labs and imaging studies  CBC: Recent Labs  Lab 04/04/21 0234 04/05/21 0155 04/05/21 0843 04/06/21 0514 04/07/21 0307 04/08/21 0546  WBC 13.3* 17.2*  --  20.5* 22.5* 23.1*  HGB 7.8* 8.3* 8.8* 8.7* 8.2* 8.1*  HCT 23.4* 24.8* 26.0* 25.7* 24.7* 24.5*  MCV 97.1 94.7  --  94.1 93.6 92.8  PLT 267 255  --  265 281 111   Basic Metabolic Panel: Recent Labs  Lab 04/04/21 0234 04/05/21 0155 04/05/21 0843 04/06/21 0514 04/07/21 0307 04/08/21 0546  NA 140 142 143 144 143 142  K 4.0 3.5 3.8 3.7 3.8 4.2  CL 108 111  --  109 104 102  CO2 21* 21*  --  $R'23 26 30  'Aw$ GLUCOSE 193* 186*  --  191* 277* 130*  BUN 71* 69*  --  63* 56* 49*  CREATININE 2.72* 2.46*  --  2.09* 1.81* 1.83*  CALCIUM 7.6* 8.2*  --  8.6* 8.3* 8.4*  MG 2.2 2.5*  --  2.2 1.8 1.6*  PHOS 3.8 3.4  --  2.4* 2.5 3.7   GFR: Estimated Creatinine Clearance: 40.8 mL/min (A) (by C-G formula based on SCr of 1.83 mg/dL (H)). Liver Function Tests: Recent Labs  Lab 04/03/21 0113 04/04/21 0234 04/08/21 0546  AST 73* 65* 37  ALT 42 62* 38  ALKPHOS 219* 146* 131*  BILITOT 3.9* 3.2* 2.4*  PROT 4.9* 5.2* 6.1*  ALBUMIN 1.8* 1.6* 1.9*   No results for  input(s): LIPASE, AMYLASE in the last 168 hours. No results for input(s): AMMONIA in the last 168 hours. Coagulation Profile: No results for input(s): INR, PROTIME in the last 168 hours. Cardiac Enzymes: No results for input(s): CKTOTAL, CKMB, CKMBINDEX, TROPONINI in the last 168 hours. BNP (last 3 results) No results for input(s): PROBNP in the last 8760 hours. HbA1C: No results for input(s): HGBA1C in the last 72 hours. CBG: Recent Labs  Lab 04/07/21 1650 04/07/21 2000 04/08/21 0008 04/08/21 0344 04/08/21 0904  GLUCAP 224* 175* 134* 116* 142*   Lipid Profile: Recent Labs    04/06/21 0514 04/08/21 0547  TRIG 335* 237*   Thyroid Function Tests: No results for input(s): TSH, T4TOTAL, FREET4, T3FREE, THYROIDAB in the last 72 hours. Anemia Panel: Recent Labs    04/07/21 1136  VITAMINB12 1,026*  FERRITIN 377*  TIBC 119*  IRON 31   Sepsis Labs: Recent Labs  Lab 04/07/21 0930  PROCALCITON 2.05    No results found for this or  any previous visit (from the past 240 hour(s)).       Radiology Studies: VAS [place] UPPER EXTREMITY VENOUS DUPLEX  Result Date: 04/07/2021 UPPER VENOUS STUDY  Patient Name:  KEAUNA BRASEL [facility]  Date of Exam:   04/07/2021 Medical Rec #: [id number]      Accession #:    [id number] Date of Birth: Aug 27, 1960      Patient Gender: F Patient Age:   61 years Exam Location:  [facility] Procedure:      VAS [place] UPPER EXTREMITY VENOUS DUPLEX Referring Phys: Patrick Jupiter GOLD --------------------------------------------------------------------------------  Indications: Swelling Comparison Study: swelling Performing Technologist: Archie Patten RVS  Examination Guidelines: A complete evaluation includes B-mode imaging, spectral Doppler, color Doppler, and power Doppler as needed of all accessible portions of each vessel. Bilateral testing is considered an integral part of a complete examination. Limited examinations for reoccurring indications may be performed as noted.   Right Findings: +----------+------------+---------+-----------+----------+-------+  RIGHT      Compressible Phasicity Spontaneous Properties Summary  +----------+------------+---------+-----------+----------+-------+  Subclavian                 Yes        Yes                         +----------+------------+---------+-----------+----------+-------+  Left Findings: +----------+------------+---------+-----------+----------+-------+  LEFT       Compressible Phasicity Spontaneous Properties Summary  +----------+------------+---------+-----------+----------+-------+  IJV            Full        Yes        Yes                         +----------+------------+---------+-----------+----------+-------+  Subclavian     Full        Yes        Yes                         +----------+------------+---------+-----------+----------+-------+  Axillary       Full        Yes        Yes                         +----------+------------+---------+-----------+----------+-------+  Brachial       Full        Yes        Yes                         +----------+------------+---------+-----------+----------+-------+  Radial         Full                                               +----------+------------+---------+-----------+----------+-------+  Ulnar          Full                                               +----------+------------+---------+-----------+----------+-------+  Cephalic       Full                                               +----------+------------+---------+-----------+----------+-------+  Basilic        Full                                               +----------+------------+---------+-----------+----------+-------+  Summary:  Right: No evidence of thrombosis in the subclavian.  Left: No evidence of deep vein thrombosis in the upper extremity. No evidence of superficial vein thrombosis in the upper extremity.  *See table(s) above for measurements and observations.  Diagnosing physician: Monica Martinez MD  Electronically signed by Monica Martinez MD on 04/07/2021 at 11:35:37 AM.    Final         Scheduled Meds:  amiodarone  200 mg Oral BID   amLODipine  10 mg Oral Daily   aspirin  324 mg Oral Daily   chlorhexidine  15 mL Mouth Rinse BID   Chlorhexidine Gluconate Cloth  6 each Topical Daily   feeding supplement (GLUCERNA SHAKE)  237 mL Oral TID BM   fluconazole  200 mg Oral QHS   furosemide  40 mg Intravenous Q12H   heparin  5,000 Units Subcutaneous Q8H   insulin aspart  0-24 Units Subcutaneous Q6H   ipratropium-albuterol  3 mL Nebulization Q4H   mouth rinse  15 mL Mouth Rinse q12n4p   pantoprazole  40 mg Oral Daily   sodium chloride flush  10-40 mL Intracatheter Q12H   Continuous Infusions:  lactated ringers 55 mL/hr at 04/07/21 1523   piperacillin-tazobactam (ZOSYN)  IV 3.375 g (04/08/21 0558)   TPN ADULT (ION)     vancomycin       LOS: 15 days    Time spent: 32 minutes    Sharen Hones, MD [facility]   To contact the attending provider between 7A-7P or the covering provider during after hours 7P-7A, please log into the web site [email or web address] and access using universal [place] password for that web site. If you do not have the password, please call the hospital operator.  04/08/2021, 10:44 AM

## 2021-04-08 NOTE — Progress Notes (Signed)
Patient returned from CT and transporter informed nurse that CT told her to let RN know the patient was bleeding from abdominal would. On assessment patient was in fact bleeding from abdominal incision. Wound vac dressing removed, wet to dry dressing applied and patient cleaned and changed. Md notified and came to bedside to assess. Md advised to keep wet to dry dressing for now and advised it was likely granulation tissue that bled from vac change this morning. Patient tolerated well. Pain medication was administered during treatment. Fuller Canada, RN

## 2021-04-08 NOTE — Plan of Care (Signed)
°  Problem: Activity: Goal: Risk for activity intolerance will decrease Outcome: Progressing   Problem: Elimination: Goal: Will not experience complications related to bowel motility Outcome: Progressing   Problem: Pain Managment: Goal: General experience of comfort will improve Outcome: Progressing   Problem: Skin Integrity: Goal: Risk for impaired skin integrity will decrease Outcome: Progressing

## 2021-04-09 ENCOUNTER — Inpatient Hospital Stay (HOSPITAL_COMMUNITY): Payer: PRIVATE HEALTH INSURANCE

## 2021-04-09 LAB — GLUCOSE, CAPILLARY
Glucose-Capillary: 148 mg/dL — ABNORMAL HIGH (ref 70–99)
Glucose-Capillary: 155 mg/dL — ABNORMAL HIGH (ref 70–99)
Glucose-Capillary: 157 mg/dL — ABNORMAL HIGH (ref 70–99)
Glucose-Capillary: 162 mg/dL — ABNORMAL HIGH (ref 70–99)
Glucose-Capillary: 171 mg/dL — ABNORMAL HIGH (ref 70–99)

## 2021-04-09 LAB — PHOSPHORUS: Phosphorus: 4.3 mg/dL (ref 2.5–4.6)

## 2021-04-09 LAB — CBC WITH DIFFERENTIAL/PLATELET
Abs Immature Granulocytes: 0.78 K/uL — ABNORMAL HIGH (ref 0.00–0.07)
Basophils Absolute: 0 K/uL (ref 0.0–0.1)
Basophils Relative: 0 %
Eosinophils Absolute: 0.2 K/uL (ref 0.0–0.5)
Eosinophils Relative: 1 %
HCT: 19.7 % — ABNORMAL LOW (ref 36.0–46.0)
Hemoglobin: 6.6 g/dL — CL (ref 12.0–15.0)
Immature Granulocytes: 4 %
Lymphocytes Relative: 8 %
Lymphs Abs: 1.4 K/uL (ref 0.7–4.0)
MCH: 31.7 pg (ref 26.0–34.0)
MCHC: 33.5 g/dL (ref 30.0–36.0)
MCV: 94.7 fL (ref 80.0–100.0)
Monocytes Absolute: 1.7 K/uL — ABNORMAL HIGH (ref 0.1–1.0)
Monocytes Relative: 10 %
Neutro Abs: 13.7 K/uL — ABNORMAL HIGH (ref 1.7–7.7)
Neutrophils Relative %: 77 %
Platelets: 284 K/uL (ref 150–400)
RBC: 2.08 MIL/uL — ABNORMAL LOW (ref 3.87–5.11)
RDW: 18.5 % — ABNORMAL HIGH (ref 11.5–15.5)
WBC: 17.8 K/uL — ABNORMAL HIGH (ref 4.0–10.5)
nRBC: 0.3 % — ABNORMAL HIGH (ref 0.0–0.2)

## 2021-04-09 LAB — BASIC METABOLIC PANEL
Anion gap: 9 (ref 5–15)
BUN: 49 mg/dL — ABNORMAL HIGH (ref 6–20)
CO2: 31 mmol/L (ref 22–32)
Calcium: 7.9 mg/dL — ABNORMAL LOW (ref 8.9–10.3)
Chloride: 100 mmol/L (ref 98–111)
Creatinine, Ser: 1.92 mg/dL — ABNORMAL HIGH (ref 0.44–1.00)
GFR, Estimated: 29 mL/min — ABNORMAL LOW (ref >60)
Glucose, Bld: 155 mg/dL — ABNORMAL HIGH (ref 70–99)
Potassium: 4.5 mmol/L (ref 3.5–5.1)
Sodium: 140 mmol/L (ref 135–145)

## 2021-04-09 LAB — PREPARE RBC (CROSSMATCH)

## 2021-04-09 LAB — MAGNESIUM: Magnesium: 2 mg/dL (ref 1.7–2.4)

## 2021-04-09 MED ORDER — SODIUM CHLORIDE 0.9% IV SOLUTION: Freq: Once | INTRAVENOUS | Status: AC

## 2021-04-09 MED ORDER — HEPARIN SODIUM (PORCINE) 5000 UNIT/ML IJ SOLN
5000.0000 [IU] | Freq: Three times a day (TID) | INTRAMUSCULAR | Status: DC
  Administered 2021-04-10 – 2021-04-15 (×17): 5000 [IU] via SUBCUTANEOUS
  Filled 2021-04-09 (×16): qty 1

## 2021-04-09 MED ORDER — ACETAMINOPHEN 500 MG PO TABS
1000.0000 mg | ORAL_TABLET | Freq: Four times a day (QID) | ORAL | Status: DC
  Administered 2021-04-09 – 2021-04-15 (×20): 1000 mg via ORAL
  Filled 2021-04-09 (×21): qty 2

## 2021-04-09 MED ORDER — OXYCODONE HCL 5 MG PO TABS
5.0000 mg | ORAL_TABLET | ORAL | Status: AC
  Administered 2021-04-09: 5 mg via ORAL
  Filled 2021-04-09: qty 1

## 2021-04-09 MED ORDER — OXYCODONE HCL 5 MG PO TABS
5.0000 mg | ORAL_TABLET | ORAL | Status: DC | PRN
  Administered 2021-04-09 – 2021-04-15 (×22): 10 mg via ORAL
  Filled 2021-04-09 (×22): qty 2

## 2021-04-09 MED ORDER — METHOCARBAMOL 500 MG PO TABS
500.0000 mg | ORAL_TABLET | Freq: Four times a day (QID) | ORAL | Status: DC | PRN
  Administered 2021-04-13 – 2021-04-15 (×8): 500 mg via ORAL
  Filled 2021-04-09 (×9): qty 1

## 2021-04-09 MED ORDER — MORPHINE SULFATE (PF) 2 MG/ML IV SOLN: 1.0000 mg | INTRAVENOUS | Status: DC | PRN

## 2021-04-09 MED ORDER — ADULT MULTIVITAMIN W/MINERALS CH
1.0000 | ORAL_TABLET | Freq: Every day | ORAL | Status: DC
  Administered 2021-04-09 – 2021-04-15 (×7): 1 via ORAL
  Filled 2021-04-09 (×7): qty 1

## 2021-04-09 NOTE — Progress Notes (Signed)
Occupational Therapy Treatment Patient Details Name: Dorothy Harris MRN: [id number] DOB: 18-Oct-1960 Today's Date: 04/09/2021   History of present illness Pt is a 61 y.o. female admitted 03/24/21 with chest pain. 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 PMH includes CAD, NSTEMI, HTN, tobacco use, prior thoracic aorta dissection for which pt did not follow up with vascular sx.   OT comments  This 61 yo female seen today in conjunction with PT to see if we could progress mobility with patient. We were limited today due to pt's Hgb was 6.6 and was to get blood so not OOB today. However she did come up to sit EOB with Mod A +2 and eat part of her breakfast post setup. Sit>stand with Denna Haggard with Mod A +2 from raised bed and sit>side lying with Mod A +2. She will continue to benefit from acute OT with follow up on CIR now recommended.    Recommendations for follow up therapy are one component of a multi-disciplinary discharge planning process, led by the attending physician.  Recommendations may be updated based on patient status, additional functional criteria and insurance authorization.    Follow Up Recommendations  Acute inpatient rehab (3hours/day)    Assistance Recommended at Discharge Frequent or constant Supervision/Assistance  Patient can return home with the following  Two people to help with walking and/or transfers;Two people to help with bathing/dressing/bathroom   Equipment Recommendations  Other (comment) (TBD next venue)       Precautions / Restrictions Precautions Precautions: Sternal;Fall Precaution Comments: monitor O2, HR; abdominal wound, 2 JP drains Restrictions Weight Bearing Restrictions: No       Mobility  Bed Mobility Overal bed mobility: Needs Assistance Bed Mobility: Rolling;Sidelying to Sit;Sit to Sidelying Rolling: Mod assist Sidelying to sit: Mod assist;+2 for safety/equipment;HOB elevated     Sit to sidelying: Mod assist;+2 for physical assistance General bed mobility comments: talked pt through trying to log roll to lessen abdominal pain. Need A for legs and trunk to come up to sit and the same with sit>sidelying>supine    Transfers Overall transfer level: Needs assistance Equipment used:  (sara stedy) Transfers: Sit to/from Stand Sit to Stand: Mod assist;+2 physical assistance                 Balance Overall balance assessment: Needs assistance Sitting-balance support: Feet supported;Single extremity supported Sitting balance-Leahy Scale: Fair Sitting balance - Comments: sat EOB to eat part of breakfast with intermittent propping on bed of 1 UE   Standing balance support: Bilateral upper extremity supported;Reliant on assistive device for balance Standing balance-Leahy Scale: Poor                             ADL either performed or assessed with clinical judgement   ADL Overall ADL's : Needs assistance/impaired Eating/Feeding: Set up;Sitting;Bed level Eating/Feeding Details (indicate cue type and reason): sat up on EOB to eat a bowl of rice krispies then her true breakfast arrived and she ate this while in bed Grooming: Wash/dry hands;Set up;Bed level                   Toilet Transfer: Moderate assistance;+2 for physical assistance Toilet Transfer Details (indicate cue type and reason):  bed (raised)>stand with sara stedy> move to HOB>sit on bed                Extremity/Trunk Assessment Upper Extremity Assessment Upper Extremity Assessment: Generalized weakness LUE Deficits / Details: reports feels stiff and asking for a squeeze ball (provided her one)            Vision Patient Visual Report: No change from baseline             Cognition Arousal/Alertness: Awake/alert Behavior During Therapy: WFL for tasks assessed/performed Overall Cognitive Status: Within Functional Limits for tasks assessed                                                       Pertinent Vitals/ Pain       Pain Assessment: Faces Faces Pain Scale: Hurts little more Pain Location: abdomen with initiation of bed mobility Pain Descriptors / Indicators: Discomfort Pain Intervention(s): Limited activity within patient's tolerance;Monitored during session;Repositioned         Frequency  Min 2X/week        Progress Toward Goals  OT Goals(current goals can now be found in the care plan section)  Progress towards OT goals: Progressing toward goals  Acute Rehab OT Goals Patient Stated Goal: to eat some real food (pt has been on clear liquid diet) OT Goal Formulation: With patient Time For Goal Achievement: 04/12/21 Potential to Achieve Goals: Good  Plan Discharge plan needs to be updated    Co-evaluation    PT/OT/SLP Co-Evaluation/Treatment: Yes Reason for Co-Treatment: For patient/therapist safety;To address functional/ADL transfers PT goals addressed during session: Mobility/safety with mobility;Balance;Strengthening/ROM OT goals addressed during session: Strengthening/ROM;ADL's and self-care      AM-PAC OT "6 Clicks" Daily Activity     Outcome Measure   Help from another person eating meals?: A Little Help from another person taking care of personal grooming?: A Little Help from another person toileting, which includes using toliet, bedpan, or urinal?: A Lot Help from another person bathing (including washing, rinsing, drying)?: A Lot Help from another person to put on and taking off regular upper body clothing?: A Lot Help from another person to put on and taking off regular lower body clothing?: Total 6 Click Score: 13    End of Session Equipment Utilized During Treatment: Oxygen (3  liters)  OT Visit Diagnosis: Unsteadiness on feet (R26.81);Other abnormalities of gait and mobility (R26.89);Muscle weakness (generalized) (M62.81);Pain Pain - part of body:  (abdomen)   Activity Tolerance Patient tolerated treatment well   Patient Left in bed;with call bell/phone within reach;with bed alarm set   Nurse Communication Mobility status (bloody bed, but did not see any active sign of bledding)        Time: 4196-2229 OT Time Calculation (min): 35 min  Charges: OT General Charges $OT Visit: 1 Visit OT Treatments $Self Care/Home Management : 8-22 mins Golden Circle, OTR/L Acute [employer] Pager [phone] Office [phone]    Almon Register 04/09/2021, 10:44 AM

## 2021-04-09 NOTE — Progress Notes (Addendum)
Dorothy Harris       [employer] [id number]             [phone]      7 Days Post-Op Procedure(s) (LRB): EXPLORATORY LAPAROTOMY (N/A) EXTENDED COLON RESECTION ILEOSTOMY (N/A) HERNIA REPAIR VENTRAL ADULT LYSIS OF ADHESION, EXTENSIVE (N/A) Subjective: NO specific c/o, tol current diet  Objective: Vital signs in last 24 hours: Temp:  [98.5 F (36.9 C)-98.7 F (37.1 C)] 98.6 F (37 C) (01/03 0415) Pulse Rate:  [83-98] 85 (01/03 0300) Cardiac Rhythm: Normal sinus rhythm (01/02 1900) Resp:  [16-21] 17 (01/03 0300) BP: (111-142)/(67-104) 116/72 (01/03 0415) SpO2:  [93 %-99 %] 98 % (01/03 0300)  Hemodynamic parameters for last 24 hours:    Intake/Output from previous day: 01/02 0701 - 01/03 0700 In: 2299.7 [P.O.:220; I.V.:1620.2; IV Piggyback:459.5] Out: 5805 [Urine:4700; Drains:355; Stool:750] Intake/Output this shift: Total I/O In: 2079.7 [I.V.:1620.2; IV Piggyback:459.5] Out: 2530 [Urine:2050; Drains:130; Stool:350]  General appearance: alert, cooperative, and no distress Heart: regular rate and rhythm Lungs: clear to auscultation bilaterally Abdomen: soft, non tender Extremities: left arm with less edema but still some present Wound: abdominal wound paccked, sternotomy ok  Lab Results: Recent Labs    04/08/21 0546 04/09/21 0500  WBC 23.1* 17.8*  HGB 8.1* 6.6*  HCT 24.5* 19.7*  PLT 304 284   BMET:  Recent Labs    04/08/21 0546 04/09/21 0500  NA 142 140  K 4.2 4.5  CL 102 100  CO2 30 31  GLUCOSE 130* 155*  BUN 49* 49*  CREATININE 1.83* 1.92*  CALCIUM 8.4* 7.9*    PT/INR: No results for input(s): LABPROT, INR in the last 72 hours. ABG    Component Value Date/Time   PHART 7.327 (L) 04/05/2021 0843   HCO3 21.9 04/05/2021 0843   TCO2 23 04/05/2021 0843   ACIDBASEDEF 4.0 (H) 04/05/2021 0843   O2SAT 92.0 04/05/2021 0843   CBG (last 3)  Recent Labs    04/08/21 1523 04/09/21 0005 04/09/21 0631  GLUCAP 137* 157* 171*     Meds Scheduled Meds:  sodium chloride   Intravenous Once   amiodarone  200 mg Oral BID   amLODipine  10 mg Oral Daily   aspirin  324 mg Oral Daily   chlorhexidine  15 mL Mouth Rinse BID   Chlorhexidine Gluconate Cloth  6 each Topical Daily   feeding supplement (GLUCERNA SHAKE)  237 mL Oral TID BM   fluconazole  200 mg Oral QHS   furosemide  40 mg Intravenous Q12H   heparin  5,000 Units Subcutaneous Q8H   insulin aspart  0-24 Units Subcutaneous Q6H   ipratropium-albuterol  3 mL Nebulization BID   mouth rinse  15 mL Mouth Rinse q12n4p   pantoprazole  40 mg Oral Daily   sodium chloride flush  10-40 mL Intracatheter Q12H   Continuous Infusions:  lactated ringers 55 mL/hr at 04/09/21 0516   piperacillin-tazobactam (ZOSYN)  IV 3.375 g (04/09/21 0507)   TPN ADULT (ION) 55 mL/hr at 04/08/21 1733   vancomycin 1,500 mg (04/08/21 1821)   PRN Meds:.hydrALAZINE, ipratropium-albuterol, LORazepam, morphine injection, ondansetron (ZOFRAN) IV, sodium chloride flush  Xrays VAS [place] UPPER EXTREMITY VENOUS DUPLEX  Result Date: 04/07/2021 UPPER VENOUS STUDY  Patient Name:  Dorothy Harris [facility]  Date of Exam:   04/07/2021 Medical Rec #: [id number]      Accession #:    [id number] Date of Birth: [birth year]      Patient Gender: F  Patient Age:   61 years Exam Location:  [facility] Procedure:      VAS [place] UPPER EXTREMITY VENOUS DUPLEX Referring Phys: WAYNE GOLD --------------------------------------------------------------------------------  Indications: Swelling Comparison Study: swelling Performing Technologist: Archie Patten RVS  Examination Guidelines: A complete evaluation includes B-mode imaging, spectral Doppler, color Doppler, and power Doppler as needed of all accessible portions of each vessel. Bilateral testing is considered an integral part of a complete examination. Limited examinations for reoccurring indications may be performed as noted.  Right Findings:  +----------+------------+---------+-----------+----------+-------+  RIGHT      Compressible Phasicity Spontaneous Properties Summary  +----------+------------+---------+-----------+----------+-------+  Subclavian                 Yes        Yes                         +----------+------------+---------+-----------+----------+-------+  Left Findings: +----------+------------+---------+-----------+----------+-------+  LEFT       Compressible Phasicity Spontaneous Properties Summary  +----------+------------+---------+-----------+----------+-------+  IJV            Full        Yes        Yes                         +----------+------------+---------+-----------+----------+-------+  Subclavian     Full        Yes        Yes                         +----------+------------+---------+-----------+----------+-------+  Axillary       Full        Yes        Yes                         +----------+------------+---------+-----------+----------+-------+  Brachial       Full        Yes        Yes                         +----------+------------+---------+-----------+----------+-------+  Radial         Full                                               +----------+------------+---------+-----------+----------+-------+  Ulnar          Full                                               +----------+------------+---------+-----------+----------+-------+  Cephalic       Full                                               +----------+------------+---------+-----------+----------+-------+  Basilic        Full                                               +----------+------------+---------+-----------+----------+-------+  Summary:  Right: No evidence of thrombosis in the subclavian.  Left: No evidence of deep vein thrombosis in the upper extremity. No evidence of superficial vein thrombosis in the upper extremity.  *See table(s) above for measurements and observations.  Diagnosing physician: Monica Martinez MD Electronically signed by  Monica Martinez MD on 04/07/2021 at 11:35:37 AM.    Final    CT CHEST ABDOMEN PELVIS WO CONTRAST  Result Date: 04/08/2021 CLINICAL DATA:  Sepsis, recent exploratory laparotomy 04/02/2021 EXAM: CT CHEST, ABDOMEN AND PELVIS WITHOUT CONTRAST TECHNIQUE: Multidetector CT imaging of the chest, abdomen and pelvis was performed following the standard protocol without IV contrast. Examination was performed without intravenous contrast due to renal insufficiency, and evaluation of the vascular structures and solid viscera is limited. Techniques to minimize radiation exposure, such as automated exposure control, adjustment of mA and/or kV according to patient size, or iterative reconstruction, are utilized, when appropriate, to reduce radiation dose to as low as reasonably achievable. COMPARISON:  03/24/2021, 04/02/2021 FINDINGS: CT CHEST FINDINGS Cardiovascular: Interval postsurgical changes from median sternotomy and ascending thoracic aortic dissection repair. Evaluation is extremely limited without intravenous contrast. 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 Again, evaluation of the vascular lumen is limited without IV contrast. There is evidence of interposition grafts placed between the bilateral common carotid arteries and right subclavian artery. A right-sided PICC extends to the superior vena cava. The heart is unremarkable without pericardial effusion. Mediastinum/Nodes: Postsurgical changes are seen within the anterior mediastinum related to recent median sternotomy. Small amount of fluid within the anterior mediastinum deep to the sternum measures up to 1.9 x 2.8 cm, likely postoperative hematoma. Thyroid, trachea, and esophagus are grossly unremarkable. No pathologic adenopathy. Lungs/Pleura: Trace bilateral pleural effusions with dependent compressive atelectasis. Airspace disease or pneumothorax. The central  airways are patent. Musculoskeletal: No acute displaced fractures. No destructive bony lesions. There is extensive edema throughout the left breast and left lateral chest wall of uncertain etiology. Reconstructed images demonstrate no additional findings. CT ABDOMEN PELVIS FINDINGS Hepatobiliary: High attenuation material within the gallbladder compatible with sludge. Stable caudate cyst within the liver. No intrahepatic duct dilation. Subcapsular hypodensity along the ventral left lobe liver image 56/3 measuring 3.1 x 1.1 cm likely reflects a small amount of subcapsular fluid. Pancreas: Unremarkable unenhanced appearance. Spleen: Unremarkable unenhanced appearance. Adrenals/Urinary Tract: No urinary tract calculi or obstructive uropathy. Adrenals are stable. Bladder is decompressed with a Foley catheter. Stomach/Bowel: Distended loops of proximal small bowel are identified, measuring up to 4 cm in diameter. There are multiple proximal small-bowel gas fluid levels. Mid to distal jejunum and ileum are decompressed, with diverting ileostomy in the right mid abdomen. Exact transition point between dilated and nondilated bowel is not well visualized. Postsurgical changes are seen from proximal colonic resection. No evidence of bowel wall thickening. Vascular/Lymphatic: Stable dilation of the visualized thoracoabdominal aorta, with diffuse atherosclerosis. The chronic dissection seen previously is not well visualized given the lack of intravenous contrast. No pathologic adenopathy. Reproductive: Uterus and bilateral adnexa are unremarkable. Other: There are 2 surgical drains within the lower pelvis and right hemiabdomen. Small amount of free fluid is seen scattered throughout the abdomen and pelvis. No obvious fluid collection or abscess on this limited unenhanced exam. No evidence of pneumoperitoneum. Postsurgical changes are seen from midline laparotomy, with surgical packing in place at the incision site. There is a  small right periumbilical fat containing ventral hernia,  though the large ventral hernia seen previously has been completely repaired in the interim. Extensive subcutaneous edema within the bilateral flanks. Musculoskeletal: No acute or destructive bony lesions. Reconstructed images demonstrate no additional findings. IMPRESSION: CT chest: 1. Interval postsurgical changes from ascending thoracic aortic dissection repair. Evaluation is extremely limited given the lack of intravenous contrast. 2. 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 Again, evaluation is extremely limited given the lack of intravenous contrast. 3. Trace bilateral pleural effusions with dependent bilateral lower lobe atelectasis. 4. Marked edema within the left lateral chest wall and left breast. 5.  Aortic Atherosclerosis (ICD10-I70.0). CT abdomen/pelvis: 1. Postsurgical changes from midline laparotomy, proximal colonic resection, and diverting ileostomy. 2. Proximal small bowel dilation with gas fluid levels, compatible with small-bowel obstruction. Decompression of the mid to distal jejunum and ileum, with no clear transition point identified on this limited exam. 3. Trace free fluid within the abdomen and pelvis, with no obvious abscess on this study limited by the lack of IV contrast. 4. High attenuation material within the gallbladder consistent with gallbladder sludge. No evidence of acute cholecystitis. 5. Interval repair of a large midline ventral hernia. Small residual right periumbilical fat containing ventral hernia identified. 6. Dependent body wall edema. 7.  Aortic Atherosclerosis (ICD10-I70.0). Electronically Signed   By: Randa Ngo M.D.   On: 04/08/2021 16:21    Assessment/Plan: S/P Procedure(s) (LRB): EXPLORATORY LAPAROTOMY (N/A) EXTENDED COLON RESECTION ILEOSTOMY (N/A) HERNIA REPAIR VENTRAL ADULT LYSIS OF ADHESION, EXTENSIVE (N/A)  1 afebrile, VSS s BP 111-142, HR 80-90's, sinus rhythm 2 sats  ok on RA 3 excellent UOP, BUN stable at 49, creat increased to 1.92- may be able to reduce lasix dosing 4 stooling 750 cc in ostomy 5 355 cc from JP drains 6 glucose adeq controlled on SSI 7 WBC decreased to 17.8, does have a left shift- she is on Vanco, Zosyn   8 H/H dropped to 6.6- received  PRBC's overnight 9 CXR without significant infilt or effus 10 CT results as noted above- per GS management 11 conts fulls/TNA- per GS 12 rehab and pulm hygiene as able    LOS: 16 days    John Giovanni PA-C Pager [phone] 04/09/2021    Chart reviewed, patient examined, agree with above. Tolerating diet and ostomy working. Chest incisions healing  CXR looks good.  Remains afebrile with WBC decreased to 17.6. Still on Zosyn and Vanc. CT chest, abd, pelvis did not show any abnormality in the chest to account for leukocytosis. Small pleural effusions and bibasilar atelectasis. Distal arch and descending aortic aneurysm stable.

## 2021-04-09 NOTE — Progress Notes (Signed)
PROGRESS NOTE    Dorothy Harris  XHB:[id number] DOB: December 26, 1960 DOA: 03/24/2021 PCP: No primary care provider on file.   Chief complaint.  Shortness of breath. Brief Narrative:  Patient has history of uncontrolled hypertension, smoker, acute type B aortic dissection in 2019, aneurysmal dilatation of proximal descending aorta 2019 and lost follow-up presented to [facility], ER on 12/18 with chest pain and shortness of breath.  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  Return to surgical ICU postop, subsequently developed abdominal pain needing to go back to operating room on 12/27.  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  Extended right hemicolectomy and ileostomy. Transferred to medical floor 12/30 on amiodarone drip, TPN. 1/1.  Patient has significant short of breath and wheezing, elevated BNP, started on IV Lasix.  Most recent TEE showed ejection fraction more than 65%.   Assessment & Plan:   Principal Problem:   Status post surgery Active Problems:   Acute on chronic diastolic CHF (congestive heart failure) (HCC)   S/P ascending aortic replacement   Encounter for weaning from ventilator [facility])   Encounter for postanesthesia care   Peritonitis ([place])   Endotracheally intubated   Acute renal failure superimposed on stage 3a chronic kidney disease ([place])   Malnutrition of moderate degree   Acute hypoxemic respiratory failure (HCC)  Acute on chronic diastolic congestive heart failure. Acute hypoxemic respiratory failure secondary to congestive heart failure. Patient developed significant hypoxemia with volume overload on 04/07/2021.  She is treated with IV Lasix. This is partially due to large volume of TPN. TPN dose was decreased, will continue IV Lasix for now.   We will monitor kidney function closely.   Ascending aortic dissection with rupture status post surgical replacement of aortic arch. Followed by vascular surgery.  Bacterial peritonitis secondary to ruptured viscus. Patient was able to eat, but not adequately.  TPN dose was decreased based on patient oral intake. Patient is still on antibiotics with vancomycin and Zosyn. General surgery is repeating CT scanning to make sure patient condition is improving.   Acute kidney injury on chronic kidney disease stage IIIa. Hypomagnesemia. Anemia of chronic kidney disease. Continue monitor kidney function closely. Patient has adequate iron B12 level.  But hemoglobin dropped down to 6.6 this morning.  She also has been bleeding from her surgical wounds, general surgery was notified by RN. She will receive a unit of PRBC today, recheck a CBC tomorrow.  Persistent atrial fibrillation with RVR. Heart rate better controlled.    Morbid obesity. Moderate protein calorie malnutrition. Patient has a BMI of 36.26 with multiple cardiac conditions. Patient also has significant lower albumin with poor p.o. intake. Continue TPN also added Glucerna   DVT prophylaxis: SQ heparin Code Status: Full Family Communication: Updated husband yesterday evening over the phone Disposition Plan:      Status is: Inpatient   Remains inpatient appropriate because: Severity of disease.  IV antibiotics     I/O last 3 completed shifts: In: 4338.7 [P.O.:1040; I.V.:2748.7; IV Piggyback:550] Out: 8101 [Urine:6800; Drains:505; Stool:850] Total I/O In: 140 [P.O.:140] Out: 51 [Urine:800]     Consultants:  General surgery, CT surgery.  Procedures:   Antimicrobials: Zosyn and vancomycin  Subjective: Patient short of breath and wheezing much improved today.  But still on 3 L oxygen this morning.  She has no cough. She has small amount of loose stools from ostomy bag.  No nausea vomiting or abdominal pain. No  fever or chills No dysuria or hematuria.  Objective: Vitals:   04/09/21 0415 04/09/21 0500 04/09/21 0741 04/09/21 0900  BP: 116/72  107/73   Pulse:   85 87  Resp:   20 17  Temp: 98.6 F (37 C)  98.4 F (36.9 C)   TempSrc: Oral  Oral   SpO2:   99% 99%  Weight:  105 kg    Height:        Intake/Output Summary (Last 24 hours) at 04/09/2021 0956 Last data filed at 04/09/2021 0743 Gross per 24 hour  Intake 2439.72 ml  Output 5155 ml  Net -2715.28 ml   Filed Weights   04/07/21 0357 04/08/21 0500 04/09/21 0500  Weight: 105 kg 105 kg 105 kg    Examination:  General exam: Appears calm and comfortable  Respiratory system: Clear to auscultation. Respiratory effort normal. Cardiovascular system: S1 & S2 heard, RRR. No JVD, murmurs, rubs, gallops or clicks. No pedal edema. Gastrointestinal system: Abdomen is nondistended, soft and nontender. No organomegaly or masses felt. Normal bowel sounds heard. Central nervous system: Alert and oriented x3. No focal neurological deficits. Extremities: Symmetric 5 x 5 power. Skin: No rashes, lesions or ulcers Psychiatry: Judgement and insight appear normal. Mood & affect appropriate.     Data Reviewed: I have personally reviewed following labs and imaging studies  CBC: Recent Labs  Lab 04/05/21 0155 04/05/21 0843 04/06/21 0514 04/07/21 0307 04/08/21 0546 04/09/21 0500  WBC 17.2*  --  20.5* 22.5* 23.1* 17.8*  NEUTROABS  --   --   --   --   --  13.7*  HGB 8.3* 8.8* 8.7* 8.2* 8.1* 6.6*  HCT 24.8* 26.0* 25.7* 24.7* 24.5* 19.7*  MCV 94.7  --  94.1 93.6 92.8 94.7  PLT 255  --  265 281 304 956   Basic Metabolic Panel: Recent Labs  Lab 04/05/21 0155 04/05/21 0843 04/06/21 0514 04/07/21 0307 04/08/21 0546 04/09/21 0500  NA 142 143 144 143 142 140  K 3.5 3.8 3.7 3.8 4.2 4.5  CL 111  --  109 104 102 100  CO2 21*  --  $R'23 26 30 31  'NH$ GLUCOSE 186*  --  191* 277* 130* 155*  BUN 69*  --  63* 56* 49* 49*  CREATININE 2.46*  --  2.09* 1.81*  1.83* 1.92*  CALCIUM 8.2*  --  8.6* 8.3* 8.4* 7.9*  MG 2.5*  --  2.2 1.8 1.6* 2.0  PHOS 3.4  --  2.4* 2.5 3.7 4.3   GFR: Estimated Creatinine Clearance: 38.9 mL/min (A) (by C-G formula based on SCr of 1.92 mg/dL (H)). Liver Function Tests: Recent Labs  Lab 04/03/21 0113 04/04/21 0234 04/08/21 0546  AST 73* 65* 37  ALT 42 62* 38  ALKPHOS 219* 146* 131*  BILITOT 3.9* 3.2* 2.4*  PROT 4.9* 5.2* 6.1*  ALBUMIN 1.8* 1.6* 1.9*   No results for input(s): LIPASE, AMYLASE in the last 168 hours. No results for input(s): AMMONIA in the last 168 hours. Coagulation Profile: No results for input(s): INR, PROTIME in the last 168 hours. Cardiac Enzymes: No results for input(s): CKTOTAL, CKMB, CKMBINDEX, TROPONINI in the last 168 hours. BNP (last 3 results) No results for input(s): PROBNP in the last 8760 hours. HbA1C: No results for input(s): HGBA1C in the last 72 hours. CBG: Recent Labs  Lab 04/08/21 0904 04/08/21 1121  04/08/21 1523 04/09/21 0005 04/09/21 0631  GLUCAP 142* 126* 137* 157* 171*   Lipid Profile: Recent Labs    04/08/21 0547  TRIG 237*   Thyroid Function Tests: No results for input(s): TSH, T4TOTAL, FREET4, T3FREE, THYROIDAB in the last 72 hours. Anemia Panel: Recent Labs    04/07/21 1136  VITAMINB12 1,026*  FERRITIN 377*  TIBC 119*  IRON 31   Sepsis Labs: Recent Labs  Lab 04/07/21 0930  PROCALCITON 2.05    No results found for this or any previous visit (from the past 240 hour(s)).       Radiology Studies: DG Chest Port 1 View  Result Date: 04/09/2021 CLINICAL DATA:  61 year old female status post abdominal surgery on December 27th for incarcerated hernia. Recent surgery for thoracic aortic repair. EXAM: PORTABLE CHEST 1 VIEW COMPARISON:  Noncontrast CT Chest, Abdomen, and Pelvis 04/08/2021 and earlier. FINDINGS: Portable AP semi upright view at 0554 hours. Changes of CABG. Stable cardiac size and mediastinal contours. Improved bibasilar  ventilation since 04/06/2021, larger lung volumes. No pneumothorax or pulmonary edema. Visualized tracheal air column is within normal limits. Stable right PICC line, right axillary surgical clips. Paucity of bowel gas in the upper abdomen. IMPRESSION: 1. Improved lung volumes and bibasilar ventilation since 04/06/2021. 2. Stable mediastinal contours status post thoracic aortic repair, CABG. 3. No new cardiopulmonary abnormality. Electronically Signed   By: Genevie Ann M.D.   On: 04/09/2021 08:21   VAS [place] UPPER EXTREMITY VENOUS DUPLEX  Result Date: 04/07/2021 UPPER VENOUS STUDY  Patient Name:  KOLBI TOFTE [facility]  Date of Exam:   04/07/2021 Medical Rec #: [id number]      Accession #:    [id number] Date of Birth: 1961-02-10      Patient Gender: F Patient Age:   29 years Exam Location:  [facility] Procedure:      VAS [place] UPPER EXTREMITY VENOUS DUPLEX Referring Phys: Patrick Jupiter GOLD --------------------------------------------------------------------------------  Indications: Swelling Comparison Study: swelling Performing Technologist: Archie Patten RVS  Examination Guidelines: A complete evaluation includes B-mode imaging, spectral Doppler, color Doppler, and power Doppler as needed of all accessible portions of each vessel. Bilateral testing is considered an integral part of a complete examination. Limited examinations for reoccurring indications may be performed as noted.  Right Findings: +----------+------------+---------+-----------+----------+-------+  RIGHT      Compressible Phasicity Spontaneous Properties Summary  +----------+------------+---------+-----------+----------+-------+  Subclavian                 Yes        Yes                         +----------+------------+---------+-----------+----------+-------+  Left Findings: +----------+------------+---------+-----------+----------+-------+  LEFT       Compressible Phasicity Spontaneous Properties Summary   +----------+------------+---------+-----------+----------+-------+  IJV            Full        Yes        Yes                         +----------+------------+---------+-----------+----------+-------+  Subclavian     Full        Yes        Yes                         +----------+------------+---------+-----------+----------+-------+  Axillary       Full        Yes  Yes                         +----------+------------+---------+-----------+----------+-------+  Brachial       Full        Yes        Yes                         +----------+------------+---------+-----------+----------+-------+  Radial         Full                                               +----------+------------+---------+-----------+----------+-------+  Ulnar          Full                                               +----------+------------+---------+-----------+----------+-------+  Cephalic       Full                                               +----------+------------+---------+-----------+----------+-------+  Basilic        Full                                               +----------+------------+---------+-----------+----------+-------+  Summary:  Right: No evidence of thrombosis in the subclavian.  Left: No evidence of deep vein thrombosis in the upper extremity. No evidence of superficial vein thrombosis in the upper extremity.  *See table(s) above for measurements and observations.  Diagnosing physician: Monica Martinez MD Electronically signed by Monica Martinez MD on 04/07/2021 at 11:35:37 AM.    Final    CT CHEST ABDOMEN PELVIS WO CONTRAST  Result Date: 04/08/2021 CLINICAL DATA:  Sepsis, recent exploratory laparotomy 04/02/2021 EXAM: CT CHEST, ABDOMEN AND PELVIS WITHOUT CONTRAST TECHNIQUE: Multidetector CT imaging of the chest, abdomen and pelvis was performed following the standard protocol without IV contrast. Examination was performed without intravenous contrast due to renal insufficiency, and evaluation of the  vascular structures and solid viscera is limited. Techniques to minimize radiation exposure, such as automated exposure control, adjustment of mA and/or kV according to patient size, or iterative reconstruction, are utilized, when appropriate, to reduce radiation dose to as low as reasonably achievable. COMPARISON:  03/24/2021, 04/02/2021 FINDINGS: CT CHEST FINDINGS Cardiovascular: Interval postsurgical changes from median sternotomy and ascending thoracic aortic dissection repair. Evaluation is extremely limited without intravenous contrast. 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 Again, evaluation of the vascular lumen is limited without IV contrast. There is evidence of interposition grafts placed between the bilateral common carotid arteries and right subclavian artery. A right-sided PICC extends to the superior vena cava. The heart is unremarkable without pericardial effusion. Mediastinum/Nodes: Postsurgical changes are seen within the anterior mediastinum related to recent median sternotomy. Small amount of fluid within the anterior mediastinum deep to the sternum measures up to 1.9 x 2.8 cm,  likely postoperative hematoma. Thyroid, trachea, and esophagus are grossly unremarkable. No pathologic adenopathy. Lungs/Pleura: Trace bilateral pleural effusions with dependent compressive atelectasis. Airspace disease or pneumothorax. The central airways are patent. Musculoskeletal: No acute displaced fractures. No destructive bony lesions. There is extensive edema throughout the left breast and left lateral chest wall of uncertain etiology. Reconstructed images demonstrate no additional findings. CT ABDOMEN PELVIS FINDINGS Hepatobiliary: High attenuation material within the gallbladder compatible with sludge. Stable caudate cyst within the liver. No intrahepatic duct dilation. Subcapsular hypodensity along the ventral left  lobe liver image 56/3 measuring 3.1 x 1.1 cm likely reflects a small amount of subcapsular fluid. Pancreas: Unremarkable unenhanced appearance. Spleen: Unremarkable unenhanced appearance. Adrenals/Urinary Tract: No urinary tract calculi or obstructive uropathy. Adrenals are stable. Bladder is decompressed with a Foley catheter. Stomach/Bowel: Distended loops of proximal small bowel are identified, measuring up to 4 cm in diameter. There are multiple proximal small-bowel gas fluid levels. Mid to distal jejunum and ileum are decompressed, with diverting ileostomy in the right mid abdomen. Exact transition point between dilated and nondilated bowel is not well visualized. Postsurgical changes are seen from proximal colonic resection. No evidence of bowel wall thickening. Vascular/Lymphatic: Stable dilation of the visualized thoracoabdominal aorta, with diffuse atherosclerosis. The chronic dissection seen previously is not well visualized given the lack of intravenous contrast. No pathologic adenopathy. Reproductive: Uterus and bilateral adnexa are unremarkable. Other: There are 2 surgical drains within the lower pelvis and right hemiabdomen. Small amount of free fluid is seen scattered throughout the abdomen and pelvis. No obvious fluid collection or abscess on this limited unenhanced exam. No evidence of pneumoperitoneum. Postsurgical changes are seen from midline laparotomy, with surgical packing in place at the incision site. There is a small right periumbilical fat containing ventral hernia, though the large ventral hernia seen previously has been completely repaired in the interim. Extensive subcutaneous edema within the bilateral flanks. Musculoskeletal: No acute or destructive bony lesions. Reconstructed images demonstrate no additional findings. IMPRESSION: CT chest: 1. Interval postsurgical changes from ascending thoracic aortic dissection repair. Evaluation is extremely limited given the lack of intravenous  contrast. 2. 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 Again, evaluation is extremely limited given the lack of intravenous contrast. 3. Trace bilateral pleural effusions with dependent bilateral lower lobe atelectasis. 4. Marked edema within the left lateral chest wall and left breast. 5.  Aortic Atherosclerosis (ICD10-I70.0). CT abdomen/pelvis: 1. Postsurgical changes from midline laparotomy, proximal colonic resection, and diverting ileostomy. 2. Proximal small bowel dilation with gas fluid levels, compatible with small-bowel obstruction. Decompression of the mid to distal jejunum and ileum, with no clear transition point identified on this limited exam. 3. Trace free fluid within the abdomen and pelvis, with no obvious abscess on this study limited by the lack of IV contrast. 4. High attenuation material within the gallbladder consistent with gallbladder sludge. No evidence of acute cholecystitis. 5. Interval repair of a large midline ventral hernia. Small residual right periumbilical fat containing ventral hernia identified. 6. Dependent body wall edema. 7.  Aortic Atherosclerosis (ICD10-I70.0). Electronically Signed   By: Randa Ngo M.D.   On: 04/08/2021 16:21        Scheduled Meds:  sodium chloride   Intravenous Once   sodium chloride   Intravenous Once   acetaminophen  1,000 mg Oral Q6H   amiodarone  200 mg Oral BID   amLODipine  10 mg Oral Daily   aspirin  324 mg Oral Daily   chlorhexidine  15  mL Mouth Rinse BID   Chlorhexidine Gluconate Cloth  6 each Topical Daily   feeding supplement (GLUCERNA SHAKE)  237 mL Oral TID BM   fluconazole  200 mg Oral QHS   furosemide  40 mg Intravenous Q12H   [START ON 04/10/2021] heparin  5,000 Units Subcutaneous Q8H   insulin aspart  0-24 Units Subcutaneous Q6H   ipratropium-albuterol  3 mL Nebulization BID   mouth rinse  15 mL Mouth Rinse q12n4p   pantoprazole  40 mg Oral Daily   sodium chloride flush  10-40 mL Intracatheter  Q12H   Continuous Infusions:  piperacillin-tazobactam (ZOSYN)  IV 3.375 g (04/09/21 0507)   TPN ADULT (ION) 55 mL/hr at 04/08/21 1733   vancomycin 1,500 mg (04/08/21 1821)     LOS: 16 days    Time spent: 32 minutes    Sharen Hones, MD [facility]   To contact the attending provider between 7A-7P or the covering provider during after hours 7P-7A, please log into the web site [email or web address] and access using universal [place] password for that web site. If you do not have the password, please call the hospital operator.  04/09/2021, 9:56 AM

## 2021-04-09 NOTE — Plan of Care (Signed)
°  Problem: Clinical Measurements: Goal: Will remain free from infection Outcome: Progressing   Problem: Activity: Goal: Risk for activity intolerance will decrease Outcome: Progressing   Problem: Nutrition: Goal: Adequate nutrition will be maintained Outcome: Progressing   Problem: Pain Managment: Goal: General experience of comfort will improve Outcome: Progressing   Problem: Activity: Goal: Risk for activity intolerance will decrease Outcome: Progressing   Problem: Cardiac: Goal: Will achieve and/or maintain hemodynamic stability Outcome: Progressing   Problem: Clinical Measurements: Goal: Postoperative complications will be avoided or minimized Outcome: Progressing

## 2021-04-09 NOTE — Progress Notes (Signed)
Date and time results received: 04/09/21 0600  Test: Hmg Critical Value: 6.6g/dL  Name of Provider Notified: J. Howerter,MD   Orders Received? Awaiting orders

## 2021-04-09 NOTE — Progress Notes (Signed)
7 Days Post-Op   Subjective/Chief Complaint: Still with bleeding from her wound overnight.  Tolerating FLD for several days now.  Feels her SOB is slightly better.    Objective: Vital signs in last 24 hours: Temp:  [98.4 F (36.9 C)-98.7 F (37.1 C)] 98.4 F (36.9 C) (01/03 0741) Pulse Rate:  [83-98] 85 (01/03 0741) Resp:  [16-21] 20 (01/03 0741) BP: (107-142)/(67-104) 107/73 (01/03 0741) SpO2:  [94 %-99 %] 99 % (01/03 0900) Weight:  [105 kg] 105 kg (01/03 0500) Last BM Date: 04/05/21  Intake/Output from previous day: 01/02 0701 - 01/03 0700 In: 2299.7 [P.O.:220; I.V.:1620.2; IV Piggyback:459.5] Out: 5805 [Urine:4700; Drains:355; Stool:750] Intake/Output this shift: Total I/O In: 140 [P.O.:140] Out: 800 [Urine:800]  Exam: Abd: soft, appropriately tender, bleeding from cutaneous edge, direct pressure held for 5 minutes and bleeding dissipated.  Wound is overall clean and repacked.  Ileostomy working well, stoma pink and viable, both JP  drains with serous output.  Lab Results:  Recent Labs    04/08/21 0546 04/09/21 0500  WBC 23.1* 17.8*  HGB 8.1* 6.6*  HCT 24.5* 19.7*  PLT 304 284   BMET Recent Labs    04/08/21 0546 04/09/21 0500  NA 142 140  K 4.2 4.5  CL 102 100  CO2 30 31  GLUCOSE 130* 155*  BUN 49* 49*  CREATININE 1.83* 1.92*  CALCIUM 8.4* 7.9*   PT/INR No results for input(s): LABPROT, INR in the last 72 hours. ABG No results for input(s): PHART, HCO3 in the last 72 hours.  Invalid input(s): PCO2, PO2   Studies/Results: DG Chest Port 1 View  Result Date: 04/09/2021 CLINICAL DATA:  61 year old female status post abdominal surgery on December 27th for incarcerated hernia. Recent surgery for thoracic aortic repair. EXAM: PORTABLE CHEST 1 VIEW COMPARISON:  Noncontrast CT Chest, Abdomen, and Pelvis 04/08/2021 and earlier. FINDINGS: Portable AP semi upright view at 0554 hours. Changes of CABG. Stable cardiac size and mediastinal contours. Improved  bibasilar ventilation since 04/06/2021, larger lung volumes. No pneumothorax or pulmonary edema. Visualized tracheal air column is within normal limits. Stable right PICC line, right axillary surgical clips. Paucity of bowel gas in the upper abdomen. IMPRESSION: 1. Improved lung volumes and bibasilar ventilation since 04/06/2021. 2. Stable mediastinal contours status post thoracic aortic repair, CABG. 3. No new cardiopulmonary abnormality. Electronically Signed   By: Genevie Ann M.D.   On: 04/09/2021 08:21   VAS [place] UPPER EXTREMITY VENOUS DUPLEX  Result Date: 04/07/2021 UPPER VENOUS STUDY  Patient Name:  Dorothy Harris [facility]  Date of Exam:   04/07/2021 Medical Rec #: [id number]      Accession #:    [id number] Date of Birth: 12-30-1960      Patient Gender: F Patient Age:   61 years Exam Location:  [facility] Procedure:      VAS [place] UPPER EXTREMITY VENOUS DUPLEX Referring Phys: Patrick Jupiter GOLD --------------------------------------------------------------------------------  Indications: Swelling Comparison Study: swelling Performing Technologist: Archie Patten RVS  Examination Guidelines: A complete evaluation includes B-mode imaging, spectral Doppler, color Doppler, and power Doppler as needed of all accessible portions of each vessel. Bilateral testing is considered an integral part of a complete examination. Limited examinations for reoccurring indications may be performed as noted.  Right Findings: +----------+------------+---------+-----------+----------+-------+  RIGHT      Compressible Phasicity Spontaneous Properties Summary  +----------+------------+---------+-----------+----------+-------+  Subclavian                 Yes        Yes                         +----------+------------+---------+-----------+----------+-------+  Left Findings: +----------+------------+---------+-----------+----------+-------+  LEFT       Compressible Phasicity Spontaneous Properties Summary   +----------+------------+---------+-----------+----------+-------+  IJV            Full        Yes        Yes                         +----------+------------+---------+-----------+----------+-------+  Subclavian     Full        Yes        Yes                         +----------+------------+---------+-----------+----------+-------+  Axillary       Full        Yes        Yes                         +----------+------------+---------+-----------+----------+-------+  Brachial       Full        Yes        Yes                         +----------+------------+---------+-----------+----------+-------+  Radial         Full                                               +----------+------------+---------+-----------+----------+-------+  Ulnar          Full                                               +----------+------------+---------+-----------+----------+-------+  Cephalic       Full                                               +----------+------------+---------+-----------+----------+-------+  Basilic        Full                                               +----------+------------+---------+-----------+----------+-------+  Summary:  Right: No evidence of thrombosis in the subclavian.  Left: No evidence of deep vein thrombosis in the upper extremity. No evidence of superficial vein thrombosis in the upper extremity.  *See table(s) above for measurements and observations.  Diagnosing physician: Monica Martinez MD Electronically signed by Monica Martinez MD on 04/07/2021 at 11:35:37 AM.    Final    CT CHEST ABDOMEN PELVIS WO CONTRAST  Result Date: 04/08/2021 CLINICAL DATA:  Sepsis, recent exploratory laparotomy 04/02/2021 EXAM: CT CHEST, ABDOMEN AND PELVIS WITHOUT CONTRAST TECHNIQUE: Multidetector CT imaging of the chest, abdomen and pelvis was performed following the standard protocol without IV contrast. Examination was performed without intravenous contrast due to renal insufficiency, and evaluation of the  vascular structures and solid viscera is limited. Techniques to minimize radiation exposure, such as automated exposure control, adjustment of mA and/or kV according  to patient size, or iterative reconstruction, are utilized, when appropriate, to reduce radiation dose to as low as reasonably achievable. COMPARISON:  03/24/2021, 04/02/2021 FINDINGS: CT CHEST FINDINGS Cardiovascular: Interval postsurgical changes from median sternotomy and ascending thoracic aortic dissection repair. Evaluation is extremely limited without intravenous contrast. 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 Again, evaluation of the vascular lumen is limited without IV contrast. There is evidence of interposition grafts placed between the bilateral common carotid arteries and right subclavian artery. A right-sided PICC extends to the superior vena cava. The heart is unremarkable without pericardial effusion. Mediastinum/Nodes: Postsurgical changes are seen within the anterior mediastinum related to recent median sternotomy. Small amount of fluid within the anterior mediastinum deep to the sternum measures up to 1.9 x 2.8 cm, likely postoperative hematoma. Thyroid, trachea, and esophagus are grossly unremarkable. No pathologic adenopathy. Lungs/Pleura: Trace bilateral pleural effusions with dependent compressive atelectasis. Airspace disease or pneumothorax. The central airways are patent. Musculoskeletal: No acute displaced fractures. No destructive bony lesions. There is extensive edema throughout the left breast and left lateral chest wall of uncertain etiology. Reconstructed images demonstrate no additional findings. CT ABDOMEN PELVIS FINDINGS Hepatobiliary: High attenuation material within the gallbladder compatible with sludge. Stable caudate cyst within the liver. No intrahepatic duct dilation. Subcapsular hypodensity along the ventral left  lobe liver image 56/3 measuring 3.1 x 1.1 cm likely reflects a small amount of subcapsular fluid. Pancreas: Unremarkable unenhanced appearance. Spleen: Unremarkable unenhanced appearance. Adrenals/Urinary Tract: No urinary tract calculi or obstructive uropathy. Adrenals are stable. Bladder is decompressed with a Foley catheter. Stomach/Bowel: Distended loops of proximal small bowel are identified, measuring up to 4 cm in diameter. There are multiple proximal small-bowel gas fluid levels. Mid to distal jejunum and ileum are decompressed, with diverting ileostomy in the right mid abdomen. Exact transition point between dilated and nondilated bowel is not well visualized. Postsurgical changes are seen from proximal colonic resection. No evidence of bowel wall thickening. Vascular/Lymphatic: Stable dilation of the visualized thoracoabdominal aorta, with diffuse atherosclerosis. The chronic dissection seen previously is not well visualized given the lack of intravenous contrast. No pathologic adenopathy. Reproductive: Uterus and bilateral adnexa are unremarkable. Other: There are 2 surgical drains within the lower pelvis and right hemiabdomen. Small amount of free fluid is seen scattered throughout the abdomen and pelvis. No obvious fluid collection or abscess on this limited unenhanced exam. No evidence of pneumoperitoneum. Postsurgical changes are seen from midline laparotomy, with surgical packing in place at the incision site. There is a small right periumbilical fat containing ventral hernia, though the large ventral hernia seen previously has been completely repaired in the interim. Extensive subcutaneous edema within the bilateral flanks. Musculoskeletal: No acute or destructive bony lesions. Reconstructed images demonstrate no additional findings. IMPRESSION: CT chest: 1. Interval postsurgical changes from ascending thoracic aortic dissection repair. Evaluation is extremely limited given the lack of intravenous  contrast. 2. 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 Again, evaluation is extremely limited given the lack of intravenous contrast. 3. Trace bilateral pleural effusions with dependent bilateral lower lobe atelectasis. 4. Marked edema within the left lateral chest wall and left breast. 5.  Aortic Atherosclerosis (ICD10-I70.0). CT abdomen/pelvis: 1. Postsurgical changes from midline laparotomy, proximal colonic resection, and diverting ileostomy. 2. Proximal small bowel dilation with gas fluid levels, compatible with small-bowel obstruction. Decompression of the mid to distal jejunum and ileum, with no clear transition point identified  on this limited exam. 3. Trace free fluid within the abdomen and pelvis, with no obvious abscess on this study limited by the lack of IV contrast. 4. High attenuation material within the gallbladder consistent with gallbladder sludge. No evidence of acute cholecystitis. 5. Interval repair of a large midline ventral hernia. Small residual right periumbilical fat containing ventral hernia identified. 6. Dependent body wall edema. 7.  Aortic Atherosclerosis (ICD10-I70.0). Electronically Signed   By: Randa Ngo M.D.   On: 04/08/2021 16:21    Anti-infectives: Anti-infectives (From admission, onward)    Start     Dose/Rate Route Frequency Ordered Stop   04/08/21 1800  vancomycin (VANCOREADY) IVPB 1500 mg/300 mL        1,500 mg 150 mL/hr over 120 Minutes Intravenous Every 48 hours 04/08/21 0751     04/06/21 2200  fluconazole (DIFLUCAN) tablet 200 mg        200 mg Oral Daily at bedtime 04/06/21 1232     04/04/21 1800  vancomycin (VANCOREADY) IVPB 1250 mg/250 mL  Status:  Discontinued        1,250 mg 166.7 mL/hr over 90 Minutes Intravenous Every 48 hours 04/03/21 1501 04/08/21 0751   04/03/21 0600  piperacillin-tazobactam (ZOSYN) IVPB 3.375 g        3.375 g 12.5 mL/hr over 240 Minutes Intravenous On call to O.R. 04/02/21 1603 04/03/21 0916   04/03/21  0200  piperacillin-tazobactam (ZOSYN) IVPB 3.375 g        3.375 g 12.5 mL/hr over 240 Minutes Intravenous Every 8 hours 04/02/21 2143     04/02/21 2245  fluconazole (DIFLUCAN) IVPB 200 mg  Status:  Discontinued        200 mg 100 mL/hr over 60 Minutes Intravenous Every 24 hours 04/02/21 2147 04/06/21 1231   04/02/21 2245  vancomycin (VANCOREADY) IVPB 2000 mg/400 mL        2,000 mg 200 mL/hr over 120 Minutes Intravenous  Once 04/02/21 2158 04/03/21 0142   04/02/21 2158  vancomycin variable dose per unstable renal function (pharmacist dosing)  Status:  Discontinued         Does not apply See admin instructions 04/02/21 2159 04/03/21 1501   03/25/21 1200  vancomycin (VANCOCIN) IVPB 1000 mg/200 mL premix        1,000 mg 200 mL/hr over 60 Minutes Intravenous  Once 03/25/21 0512 03/25/21 1206   03/25/21 0600  ceFAZolin (ANCEF) IVPB 2g/100 mL premix        2 g 200 mL/hr over 30 Minutes Intravenous Every 8 hours 03/25/21 0512 03/26/21 2217   03/24/21 1800  ceFAZolin (ANCEF) IVPB 2g/100 mL premix        2 g 200 mL/hr over 30 Minutes Intravenous To Surgery 03/24/21 1738 03/25/21 0308   03/24/21 1800  ceFAZolin (ANCEF) IVPB 2g/100 mL premix  Status:  Discontinued        2 g 200 mL/hr over 30 Minutes Intravenous To Surgery 03/24/21 1734 03/25/21 0512   03/24/21 1800  vancomycin (VANCOREADY) IVPB 1250 mg/250 mL        1,250 mg 166.7 mL/hr over 90 Minutes Intravenous To Surgery 03/24/21 1738 03/24/21 2114       Assessment/Plan: Chronically incarcerated ventral hernia Bowel perforation -POD #7 s/p ex lap, adhesiolysis, extended R hemicolectomy, primary incisional hernia repair, ileostomy creation, abdominal washout, drain placement x2, incisional wound vac placement 30x8x4cm - Dr. Bobbye Morton 04/02/21 -tolerating FLD for several days -adv to heart health diet, getting TID Glucerna as well -wean TNA to off  as ileus appears to be improving clinically -working with [facility] on ileostomy care -midline wound  with more oozing today.  Pressure held for 5 minutes and bleeding stopped. Wound repacked.  Plan to leave packing today and only change if saturated.  Restarted dressing changes tomorrow.  If bleeding remains stopped, will reconsider VAC placement on Friday -needs to mobilize more.   -only needs 4 days of post op zosyn from surgical standpoint.  Continued due to elevated WBC although CT does not show any evidence of acute intra-abdominal infection.  NO FURTHER ABX NEEDED FROM SURGERY STANDPOINT.  Will defer to medical team regarding other possibilities although WBC down to 17K today.  She is AF. -can likely Dc JP drains soon -multimodal pain control ordered to minimize IV pain meds  FEN - HH diet, DC TNA, Glucerna VTE - heparin, but will hold today and restart in am to minimize risk of further wound bleeding ID - Vanc/zosyn - no further needed from abdominal standpoint  ABL anemia - getting pRBCs today for hgb of 6 S/p ascending aortic replacement Acute on chronic kidney disease A fib obesity   Henreitta Cea PA-C 04/09/2021

## 2021-04-09 NOTE — Progress Notes (Signed)
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   Patient Measurements: Height: 5\' 7"  (170.2 cm) Weight: 105 kg (231 lb 7.7 oz) IBW/kg (Calculated) : 61.6 TPN AdjBW (KG): 66 Body mass index is 36.26 kg/m. 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 Patient was then found to have a chronically incarcerated hernia with likely ileus. S/p exploratory laparotomy, adhesiolysis x30min, extended right hemicolectomy, primary incisional hernia repair, and ileostomy creation 12/27. Patient has had very minimal PO intake since 12/19 surgery and anticipate cannot start tube feeds for some time after abdominal surgery. Surgery anticipates post-op ileus. Patient is at risk for refeeding. Pharmacy consulted for TPN.  Glucose / Insulin: no hx DM but A1C 5.9, CBGs <180, used 10 units insulin/24h + 35 units in TPN  - likely elevated due to TPN + increased po intake Electrolytes: K 4.5 up (goal >=4), CoCa:9.58, Phos 4.3,  Mg 2(2g IV, goal >=2), others wnl Renal: Scr 1.92 (BL ~2), BUN 49  Hepatic: LFT wnl, Tbili 2.4 down, Albumin 1.9, TG 237 Intake / Output; MIVF:  LR 55 ml/hr, FLD: 220 ml charted Ouput: UOP 1.9 ml/kg/hr on furosemide IV 40 Q 12 hr, Drains 355 ml, ileostomy 750 ml  GI Imaging: 12/18 CT abd: no findings 12/24 KUB: Gas within nondistended small bowel and mildly prominent right colon 12/25 KUB: gas in small bowel loops may suggest ileus 12/27 KUB: Increased gaseous distention of the right colon which could be due to postoperative ileus or colonic obstruction  12/27 CT abd: Large amount of free intraperitoneal air compatible with a bowel perforation; dilatation of the ascending  colon and cecum indicating mechanical obstruction; descending colon is relatively decompressed indicating complete or high-grade partial obstruction   GI Surgeries / Procedures:  12/27:  exploratory laparotomy, adhesiolysis x28min, extended right hemicolectomy, primary incisional hernia repair, and ileostomy creation  Central access: PICC 12/27 TPN start date: 12/28   Nutritional Goals: Goal TPN rate is 90 mL/hr (provides 131 g of protein, 259g dextrose, 60g lipids, and 2012 kcals per day)  RD Assessment: Estimated Needs Total Energy Estimated Needs: 1900-2100 kcals Total Protein Estimated Needs: 120-135g Total Fluid Estimated Needs: >/= 1.9 L   Current Nutrition:  TPN 12/31: FLD 1/1: FLD - only ate 2 containers of peaches and some juice. Does not like Boost because it's too sweet 1/2 FLD: ate half bowl grits, chicken broth, jello, half an Ensure (likes strawberry) - per patient, no particular reason for not eating more, no pain, was "never a big eater"  1/3 FLD: ate pears so far    Plan:  Stop TPN after current bag per surgery - taper off over 2 hours, spoke with RN, orders entered  Change SSI to TID with meals Start PO Multivitamin    Benetta Spar, PharmD, BCPS, BCCP Clinical Pharmacist  Please check AMION for all [place] phone numbers After 10:00 PM, call [place] [phone]

## 2021-04-09 NOTE — Progress Notes (Signed)
PA at bedside notified of pt's bleeding wound, dressing removed and pressure applied at bleeding site, new dressing reapplied, orders obtained to not change dressing today anymore.

## 2021-04-09 NOTE — Progress Notes (Signed)
Physical Therapy Treatment Patient Details Name: Dorothy Harris MRN: [id number] DOB: 01-19-1961 Today's Date: 04/09/2021   History of Present Illness Pt is a 61 y.o. female admitted 03/24/21 with chest pain. 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 PMH includes CAD, NSTEMI, HTN, tobacco use, prior thoracic aorta dissection for which pt did not follow up with vascular sx.    PT Comments    Patient seen with OT for progression of activity as tolerated which was limited due to low hemoglobin pending blood transfusion.  She tolerated EOB for initiating breakfast with cereal, then stood briefly to [facility] to change bed linen.  She returned to supine and was set up for rest of her breakfast.  Feel she remains appropriate for acute inpatient rehab stay prior to home.  Hopeful for further progression of activity as medical issues stabilize.  PT will continue to follow.    Recommendations for follow up therapy are one component of a multi-disciplinary discharge planning process, led by the attending physician.  Recommendations may be updated based on patient status, additional functional criteria and insurance authorization.  Follow Up Recommendations  Acute inpatient rehab (3hours/day)     Assistance Recommended at Discharge Frequent or constant Supervision/Assistance  Patient can return home with the following     Equipment Recommendations  Other (comment) (TBA)    Recommendations for Other Services       Precautions / Restrictions Precautions Precautions: Fall;Sternal Precaution Comments: monitor O2, HR; abdominal wound, 2 JP drains     Mobility  Bed Mobility Overal bed mobility: Needs Assistance Bed Mobility: Rolling;Sidelying to  Sit;Sit to Sidelying Rolling: Mod assist Sidelying to sit: Mod assist;+2 for safety/equipment;HOB elevated     Sit to sidelying: Mod assist;+2 for physical assistance General bed mobility comments: talked pt through trying to log roll to lessen abdominal pain. Need A for legs and trunk to come up to sit and the same with sit>sidelying>supine    Transfers Overall transfer level: Needs assistance Equipment used:  (sara stedy) Transfers: Sit to/from Stand Sit to Stand: Mod assist;+2 physical assistance           General transfer comment: stood by EOB with assist for lifting and balance replaced bed pads and changed gown due to soiled with blood from prior dressing change per RN    Ambulation/Gait                   Stairs             Wheelchair Mobility    Modified Rankin (Stroke Patients Only)       Balance Overall balance assessment: Needs assistance   Sitting balance-Leahy Scale: Fair Sitting balance - Comments: sat EOB to eat part of breakfast with intermittent propping on bed of 1 UE     Standing balance-Leahy Scale: Poor Standing balance comment: dependent on BUE support and external assist                            Cognition Arousal/Alertness: Awake/alert Behavior During Therapy: WFL for tasks assessed/performed Overall Cognitive Status: Within Functional Limits for tasks assessed  Exercises      General Comments General comments (skin integrity, edema, etc.): VSS up at EOB; hemoglobin low with pending unit of blood per RN okay to work at bedside      Pertinent Vitals/Pain Pain Assessment: Faces Faces Pain Scale: Hurts little more Pain Location: abdomen with initiation of bed mobility Pain Descriptors / Indicators: Discomfort Pain Intervention(s): Monitored during session;Limited activity within patient's tolerance;Repositioned    Home Living                           Prior Function            PT Goals (current goals can now be found in the care plan section) Progress towards PT goals: Progressing toward goals    Frequency    Min 3X/week      PT Plan Current plan remains appropriate    Co-evaluation PT/OT/SLP Co-Evaluation/Treatment: Yes Reason for Co-Treatment: For patient/therapist safety;To address functional/ADL transfers PT goals addressed during session: Mobility/safety with mobility;Balance;Strengthening/ROM        AM-PAC PT "6 Clicks" Mobility   Outcome Measure  Help needed turning from your back to your side while in a flat bed without using bedrails?: A Lot Help needed moving from lying on your back to sitting on the side of a flat bed without using bedrails?: A Lot Help needed moving to and from a bed to a chair (including a wheelchair)?: Total Help needed standing up from a chair using your arms (e.g., wheelchair or bedside chair)?: Total Help needed to walk in hospital room?: Total Help needed climbing 3-5 steps with a railing? : Total 6 Click Score: 8    End of Session   Activity Tolerance: Patient tolerated treatment well Patient left: in bed;with call bell/phone within reach   PT Visit Diagnosis: Other abnormalities of gait and mobility (R26.89);Muscle weakness (generalized) (M62.81)     Time: 7628-3151 PT Time Calculation (min) (ACUTE ONLY): 29 min  Charges:  $Therapeutic Activity: 8-22 mins                     Magda Kiel, PT Acute Rehabilitation Services VOHYW:[phone] [phone] 04/09/2021    Dorothy Harris 04/09/2021, 5:02 PM

## 2021-04-09 NOTE — Progress Notes (Signed)
TRH night cross cover note:  I was notified by RN of patient's updated hemoglobin result of 6.6 per this morning's labs, which is relative to yesterday morning's value of 8.1.   Per my chart review, it appears that the patient has received a total of 2 units PRBC transfusion over the course of this hospitalization, with most recent unit transfused on 04/04/2021.  In the setting of this hospitalization, which includes acute on chronic diastolic heart failure, will transfuse 1 unit PRBC slowly, over 3 hours, noting that the patient is currently on Lasix 40 mg IV twice daily, with most recent dose administered at 5 AM this morning.    Babs Bertin, DO Hospitalist

## 2021-04-09 NOTE — Progress Notes (Addendum)
Nutrition Follow-up  DOCUMENTATION CODES:   Non-severe (moderate) malnutrition in context of acute illness/injury  INTERVENTION:   Wean and d/c TPN today. Continue Glucerna Shake po TID, each supplement provides 220 kcal and 10 grams of protein. MVI with minerals daily.  NUTRITION DIAGNOSIS:   Moderate Malnutrition related to acute illness (aortic disection repair, bowel perforation) as evidenced by mild muscle depletion, energy intake < or equal to 50% for > or equal to 5 days.  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 PMH includes HTN, hernia, smoker.  TPN is being discontinued today. Diet advanced to heart healthy. Patient has been tolerating full liquids well for the past 2 days. She did not like the Boost Breeze supplements because it was too sweet. Supplement was changed to [place] yesterday.   She has had 2 supplements so far. Meal intakes documented at 100% of full liquids for lunch yesterday and breakfast today.  Labs reviewed.  CBG: 157-171  Medications reviewed and include Lasix, Novolog, Protonix.  Admission weight 101.8 kg Current weight 105 kg Increase in weight r/t volume overload. Being treated with IV Lasix.  Abd wound VAC with 75 ml output x 24 hours Total drain output 280 ml x 24 hours Ileostomy output 750 ml x 24 hours  Diet Order:   Diet Order             Diet Heart Room service appropriate? Yes; Fluid consistency: Thin  Diet effective now                   EDUCATION NEEDS:   Not appropriate for education at this time  Skin:  Skin  Assessment: Skin Integrity Issues: Skin Integrity Issues:: Wound VAC, Other (Comment) Wound Vac: incisional-abdomen Incisions: chest, abdomen Other: ileostomy  Last BM:  12/30 via ileostomy  Height:   Ht Readings from Last 1 Encounters:  03/24/21 $RemoveB'5\' 7"'pFAMslsx$  (1.702 m)    Weight:   Wt Readings from Last 1 Encounters:  04/09/21 105 kg    BMI:  Body mass index is 36.26 kg/m.  Estimated Nutritional Needs:   Kcal:  1900-2100 kcals  Protein:  120-135g  Fluid:  >/= 1.9 L    Lucas Mallow RD, LDN, CNSC Please refer to Amion for contact information.

## 2021-04-10 ENCOUNTER — Telehealth: Payer: Self-pay

## 2021-04-10 LAB — PHOSPHORUS: Phosphorus: 5.1 mg/dL — ABNORMAL HIGH (ref 2.5–4.6)

## 2021-04-10 LAB — COMPREHENSIVE METABOLIC PANEL
ALT: 33 U/L (ref 0–44)
AST: 27 U/L (ref 15–41)
Albumin: 1.8 g/dL — ABNORMAL LOW (ref 3.5–5.0)
Alkaline Phosphatase: 100 U/L (ref 38–126)
Anion gap: 10 (ref 5–15)
BUN: 46 mg/dL — ABNORMAL HIGH (ref 6–20)
CO2: 31 mmol/L (ref 22–32)
Calcium: 8.1 mg/dL — ABNORMAL LOW (ref 8.9–10.3)
Chloride: 98 mmol/L (ref 98–111)
Creatinine, Ser: 2.17 mg/dL — ABNORMAL HIGH (ref 0.44–1.00)
GFR, Estimated: 25 mL/min — ABNORMAL LOW (ref >60)
Glucose, Bld: 162 mg/dL — ABNORMAL HIGH (ref 70–99)
Potassium: 4.9 mmol/L (ref 3.5–5.1)
Sodium: 139 mmol/L (ref 135–145)
Total Bilirubin: 1.8 mg/dL — ABNORMAL HIGH (ref 0.3–1.2)
Total Protein: 5.8 g/dL — ABNORMAL LOW (ref 6.5–8.1)

## 2021-04-10 LAB — GLUCOSE, CAPILLARY
Glucose-Capillary: 435 mg/dL — ABNORMAL HIGH (ref 70–99)
Glucose-Capillary: 146 mg/dL — ABNORMAL HIGH (ref 70–99)
Glucose-Capillary: 169 mg/dL — ABNORMAL HIGH (ref 70–99)
Glucose-Capillary: 103 mg/dL — ABNORMAL HIGH (ref 70–99)
Glucose-Capillary: 106 mg/dL — ABNORMAL HIGH (ref 70–99)

## 2021-04-10 LAB — CBC WITH DIFFERENTIAL/PLATELET
Abs Immature Granulocytes: 0.41 10*3/uL — ABNORMAL HIGH (ref 0.00–0.07)
Basophils Absolute: 0 10*3/uL (ref 0.0–0.1)
Basophils Relative: 0 %
Eosinophils Absolute: 0.2 10*3/uL (ref 0.0–0.5)
Eosinophils Relative: 1 %
HCT: 21.2 % — ABNORMAL LOW (ref 36.0–46.0)
Hemoglobin: 7 g/dL — ABNORMAL LOW (ref 12.0–15.0)
Immature Granulocytes: 2 %
Lymphocytes Relative: 9 %
Lymphs Abs: 1.5 10*3/uL (ref 0.7–4.0)
MCH: 31.3 pg (ref 26.0–34.0)
MCHC: 33 g/dL (ref 30.0–36.0)
MCV: 94.6 fL (ref 80.0–100.0)
Monocytes Absolute: 1.5 10*3/uL — ABNORMAL HIGH (ref 0.1–1.0)
Monocytes Relative: 9 %
Neutro Abs: 13.3 10*3/uL — ABNORMAL HIGH (ref 1.7–7.7)
Neutrophils Relative %: 79 %
Platelets: 283 10*3/uL (ref 150–400)
RBC: 2.24 MIL/uL — ABNORMAL LOW (ref 3.87–5.11)
RDW: 18.4 % — ABNORMAL HIGH (ref 11.5–15.5)
WBC: 17 10*3/uL — ABNORMAL HIGH (ref 4.0–10.5)
nRBC: 0.2 % (ref 0.0–0.2)

## 2021-04-10 LAB — MAGNESIUM: Magnesium: 1.8 mg/dL (ref 1.7–2.4)

## 2021-04-10 MED ORDER — CARVEDILOL 3.125 MG PO TABS
3.1250 mg | ORAL_TABLET | Freq: Two times a day (BID) | ORAL | Status: DC
  Administered 2021-04-11 (×2): 3.125 mg via ORAL
  Filled 2021-04-10 (×2): qty 1

## 2021-04-10 NOTE — Progress Notes (Signed)
Mobility Specialist: Progress Note   04/10/21 1459  Mobility  Activity Stood at bedside  Level of Assistance +2 (takes two people) Education officer, museum)  Assistive Device Stedy  Mobility  (OOB with stedy)  Mobility Response Tolerated well  Mobility performed by Mobility specialist;Nurse  $Mobility charge 1 Mobility   Pre-Mobility: 77 HR, 108/69 BP, 100% SpO2 Post-Mobility: 80 HR, 96% SpO2  Pt stood x2 at the stedy with RN and myself requiring minA to stand. Pt able to maintain balance independently once standing with c/o RLE pain she rated 5/10. Offered to assist pt to the recliner but pt to bed after session per request with call bell in her lap, RN present in the room.   [facility] Zineb Glade Mobility Specialist Mobility Specialist 4 [place]: [phone] Mobility Specialist 2 [place]: [phone]

## 2021-04-10 NOTE — Consult Note (Signed)
[place] Nurse ostomy follow up Stoma type/location: RLQ, ileostomy  Stomal assessment/size: 1 1/4" round,budded, os at 55 ol cock  Peristomal assessment: NA Treatment options for stomal/peristomal skin: 2" skin barrier ring Output : liquid green; output does not seem to be excessive Ostomy pouching: 2pc. 2 3/4" with 2" skin barrier ring Education provided:  Reviewed education from this week. Patient will need assistance with ostomy care. Will continue education later this week.  Enrolled patient in [place] Start Discharge program: Yes  [place] Nurse will follow along with you for continued support with ostomy teaching and care [place] MSN, [place], [place], [place], [place]

## 2021-04-10 NOTE — Telephone Encounter (Signed)
STD form completed for [employer] insurance and faxed to [phone]. Beginning LOA 03/24/21 through approx. 06/24/21. Elyn Peers at front desk for pick up

## 2021-04-10 NOTE — Progress Notes (Addendum)
° °   °  [address]       [place] [address]             [phone]     15 Days Post-op Repair Type A aortic dissection    8 Days Post-Op Procedure(s) (LRB): EXPLORATORY LAPAROTOMY (N/A) EXTENDED COLON RESECTION ILEOSTOMY (N/A) HERNIA REPAIR VENTRAL ADULT LYSIS OF ADHESION, EXTENSIVE (N/A)    Subjective: Awake and alert, said she rested well.  Stood with PT yesterday.  Tolerating PO's.   Objective: Vital signs in last 24 hours: Temp:  [97.8 F (36.6 C)-98.6 F (37 C)] 98.5 F (36.9 C) (01/04 0408) Pulse Rate:  [66-98] 71 (01/04 0408) Cardiac Rhythm: Normal sinus rhythm (01/03 1900) Resp:  [12-21] 20 (01/04 0408) BP: (99-144)/(63-88) 117/72 (01/04 0408) SpO2:  [94 %-100 %] 100 % (01/04 0408)     Intake/Output from previous day: 01/03 0701 - 01/04 0700 In: 1944 [P.O.:500; I.V.:740.1; Blood:412.3; IV Piggyback:51.6] Out: 4750 [Urine:4150; Drains:100; XHBZJ:696] Intake/Output this shift: No intake/output data recorded.  General appearance: alert, cooperative, and no distress Neurologic: intact Heart: RRR, brief run of SVT yesterday morning. No further atrial fibrillation Lungs: breath sounds are clear anterior.  Abdomen: soft, NT.  Extremities: all well perfused. SCD's to LE's. Wound: the sternotomy and right subclavian incisions are well approximated and dry.   Lab Results: Recent Labs    04/09/21 0500 04/10/21 0402  WBC 17.8* 17.0*  HGB 6.6* 7.0*  HCT 19.7* 21.2*  PLT 284 283   BMET:  Recent Labs    04/09/21 0500 04/10/21 0402  NA 140 139  K 4.5 4.9  CL 100 98  CO2 31 31  GLUCOSE 155* 162*  BUN 49* 46*  CREATININE 1.92* 2.17*  CALCIUM 7.9* 8.1*    PT/INR: No results for input(s): LABPROT, INR in the last 72 hours. ABG    Component Value Date/Time   PHART 7.327 (L) 04/05/2021 0843   HCO3 21.9 04/05/2021 0843   TCO2 23 04/05/2021 0843   ACIDBASEDEF 4.0 (H) 04/05/2021 0843   O2SAT 92.0 04/05/2021 0843   CBG (last 3)   Recent Labs    04/09/21 1715 04/09/21 2343 04/10/21 0559  GLUCAP 155* 148* 146*    Assessment/Plan: S/P Procedure(s) (LRB): EXPLORATORY LAPAROTOMY (N/A) EXTENDED COLON RESECTION ILEOSTOMY (N/A) HERNIA REPAIR VENTRAL ADULT LYSIS OF ADHESION, EXTENSIVE (N/A)  -POD 15 complex repair of type A dissection presenting with contained rupture. Good distal perfusion. Incisions healing well.   -POD 45 Expl Lap with resection of ischemic bowel, lysis of adhesions, repair of ventral hernia, and ileostomy. Tolerating diet with appropriate function of the ileostomy. Mgt per general surgery.  -Renal- Chronic stage 3 kidney disease- good UO on Lasix $Remove'40mg'EpyhMfA$  IV q12h, creat trending up but near baseline.   -Post-op atrial fibrillation- maintaining SR on oral amiodarone.   -Deconditioning- slow progress with mobility.  Appreciate assistance from PT/OT. Working toward CIR at discharge.  -PULM- Mild hypoxia, improved. Now maintaining sats on RA. CXR yesterday showed improved lung volumes, no significant effusions.  Continue Duonebs and work on Research scientist (medical).      LOS: 17 days    Antony Odea 04/10/2021   Chart reviewed, patient examined, agree with above. She continues to make progress.

## 2021-04-10 NOTE — Progress Notes (Signed)
8 Days Post-Op   Subjective/Chief Complaint: Feeling better today.  No further bleeding.  Tolerating her solid diet.   Objective: Vital signs in last 24 hours: Temp:  [97.8 F (36.6 C)-98.5 F (36.9 C)] 98.3 F (36.8 C) (01/04 0810) Pulse Rate:  [66-98] 77 (01/04 0900) Resp:  [12-21] 20 (01/04 0900) BP: (99-144)/(63-88) 123/70 (01/04 0900) SpO2:  [94 %-100 %] 97 % (01/04 0900) Last BM Date: 04/09/21  Intake/Output from previous day: 01/03 0701 - 01/04 0700 In: 1944 [P.O.:500; I.V.:740.1; Blood:412.3; IV Piggyback:51.6] Out: 2951 [Urine:4150; Drains:100; Stool:500] Intake/Output this shift: Total I/O In: -  Out: 50 [Drains:50]  Exam: Abd: soft, appropriately tender, no further bleeding, wound overall clean and repacked.  RLQ JP removed with no issues.  LLQ with 75cc of serous output yesterday.  Lab Results:  Recent Labs    04/09/21 0500 04/10/21 0402  WBC 17.8* 17.0*  HGB 6.6* 7.0*  HCT 19.7* 21.2*  PLT 284 283   BMET Recent Labs    04/09/21 0500 04/10/21 0402  NA 140 139  K 4.5 4.9  CL 100 98  CO2 31 31  GLUCOSE 155* 162*  BUN 49* 46*  CREATININE 1.92* 2.17*  CALCIUM 7.9* 8.1*   PT/INR No results for input(s): LABPROT, INR in the last 72 hours. ABG No results for input(s): PHART, HCO3 in the last 72 hours.  Invalid input(s): PCO2, PO2   Studies/Results: DG Chest Port 1 View  Result Date: 04/09/2021 CLINICAL DATA:  61 year old female status post abdominal surgery on December 27th for incarcerated hernia. Recent surgery for thoracic aortic repair. EXAM: PORTABLE CHEST 1 VIEW COMPARISON:  Noncontrast CT Chest, Abdomen, and Pelvis 04/08/2021 and earlier. FINDINGS: Portable AP semi upright view at 0554 hours. Changes of CABG. Stable cardiac size and mediastinal contours. Improved bibasilar ventilation since 04/06/2021, larger lung volumes. No pneumothorax or pulmonary edema. Visualized tracheal air column is within normal limits. Stable right PICC line,  right axillary surgical clips. Paucity of bowel gas in the upper abdomen. IMPRESSION: 1. Improved lung volumes and bibasilar ventilation since 04/06/2021. 2. Stable mediastinal contours status post thoracic aortic repair, CABG. 3. No new cardiopulmonary abnormality. Electronically Signed   By: Genevie Ann M.D.   On: 04/09/2021 08:21   CT CHEST ABDOMEN PELVIS WO CONTRAST  Result Date: 04/08/2021 CLINICAL DATA:  Sepsis, recent exploratory laparotomy 04/02/2021 EXAM: CT CHEST, ABDOMEN AND PELVIS WITHOUT CONTRAST TECHNIQUE: Multidetector CT imaging of the chest, abdomen and pelvis was performed following the standard protocol without IV contrast. Examination was performed without intravenous contrast due to renal insufficiency, and evaluation of the vascular structures and solid viscera is limited. Techniques to minimize radiation exposure, such as automated exposure control, adjustment of mA and/or kV according to patient size, or iterative reconstruction, are utilized, when appropriate, to reduce radiation dose to as low as reasonably achievable. COMPARISON:  03/24/2021, 04/02/2021 FINDINGS: CT CHEST FINDINGS Cardiovascular: Interval postsurgical changes from median sternotomy and ascending thoracic aortic dissection repair. Evaluation is extremely limited without intravenous contrast. 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 Again, evaluation of the vascular lumen is limited without IV contrast. There is evidence of interposition grafts placed between the bilateral common carotid arteries and right subclavian artery. A right-sided PICC extends to the superior vena cava. The heart is unremarkable without pericardial effusion. Mediastinum/Nodes: Postsurgical changes are seen within the anterior mediastinum related to recent median sternotomy. Small amount of fluid within  the anterior mediastinum deep to the sternum measures up  to 1.9 x 2.8 cm, likely postoperative hematoma. Thyroid, trachea, and esophagus are grossly unremarkable. No pathologic adenopathy. Lungs/Pleura: Trace bilateral pleural effusions with dependent compressive atelectasis. Airspace disease or pneumothorax. The central airways are patent. Musculoskeletal: No acute displaced fractures. No destructive bony lesions. There is extensive edema throughout the left breast and left lateral chest wall of uncertain etiology. Reconstructed images demonstrate no additional findings. CT ABDOMEN PELVIS FINDINGS Hepatobiliary: High attenuation material within the gallbladder compatible with sludge. Stable caudate cyst within the liver. No intrahepatic duct dilation. Subcapsular hypodensity along the ventral left lobe liver image 56/3 measuring 3.1 x 1.1 cm likely reflects a small amount of subcapsular fluid. Pancreas: Unremarkable unenhanced appearance. Spleen: Unremarkable unenhanced appearance. Adrenals/Urinary Tract: No urinary tract calculi or obstructive uropathy. Adrenals are stable. Bladder is decompressed with a Foley catheter. Stomach/Bowel: Distended loops of proximal small bowel are identified, measuring up to 4 cm in diameter. There are multiple proximal small-bowel gas fluid levels. Mid to distal jejunum and ileum are decompressed, with diverting ileostomy in the right mid abdomen. Exact transition point between dilated and nondilated bowel is not well visualized. Postsurgical changes are seen from proximal colonic resection. No evidence of bowel wall thickening. Vascular/Lymphatic: Stable dilation of the visualized thoracoabdominal aorta, with diffuse atherosclerosis. The chronic dissection seen previously is not well visualized given the lack of intravenous contrast. No pathologic adenopathy. Reproductive: Uterus and bilateral adnexa are unremarkable. Other: There are 2 surgical drains within the lower pelvis and right hemiabdomen. Small amount of free fluid is seen  scattered throughout the abdomen and pelvis. No obvious fluid collection or abscess on this limited unenhanced exam. No evidence of pneumoperitoneum. Postsurgical changes are seen from midline laparotomy, with surgical packing in place at the incision site. There is a small right periumbilical fat containing ventral hernia, though the large ventral hernia seen previously has been completely repaired in the interim. Extensive subcutaneous edema within the bilateral flanks. Musculoskeletal: No acute or destructive bony lesions. Reconstructed images demonstrate no additional findings. IMPRESSION: CT chest: 1. Interval postsurgical changes from ascending thoracic aortic dissection repair. Evaluation is extremely limited given the lack of intravenous contrast. 2. 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 Again, evaluation is extremely limited given the lack of intravenous contrast. 3. Trace bilateral pleural effusions with dependent bilateral lower lobe atelectasis. 4. Marked edema within the left lateral chest wall and left breast. 5.  Aortic Atherosclerosis (ICD10-I70.0). CT abdomen/pelvis: 1. Postsurgical changes from midline laparotomy, proximal colonic resection, and diverting ileostomy. 2. Proximal small bowel dilation with gas fluid levels, compatible with small-bowel obstruction. Decompression of the mid to distal jejunum and ileum, with no clear transition point identified on this limited exam. 3. Trace free fluid within the abdomen and pelvis, with no obvious abscess on this study limited by the lack of IV contrast. 4. High attenuation material within the gallbladder consistent with gallbladder sludge. No evidence of acute cholecystitis. 5. Interval repair of a large midline ventral hernia. Small residual right periumbilical fat containing ventral hernia identified. 6. Dependent body wall edema. 7.  Aortic Atherosclerosis (ICD10-I70.0). Electronically Signed   By: Randa Ngo M.D.   On:  04/08/2021 16:21    Anti-infectives: Anti-infectives (From admission, onward)    Start     Dose/Rate Route Frequency Ordered Stop   04/08/21 1800  vancomycin (VANCOREADY) IVPB 1500 mg/300 mL  Status:  Discontinued        1,500 mg 150  mL/hr over 120 Minutes Intravenous Every 48 hours 04/08/21 0751 04/09/21 1454   04/06/21 2200  fluconazole (DIFLUCAN) tablet 200 mg  Status:  Discontinued        200 mg Oral Daily at bedtime 04/06/21 1232 04/09/21 1454   04/04/21 1800  vancomycin (VANCOREADY) IVPB 1250 mg/250 mL  Status:  Discontinued        1,250 mg 166.7 mL/hr over 90 Minutes Intravenous Every 48 hours 04/03/21 1501 04/08/21 0751   04/03/21 0600  piperacillin-tazobactam (ZOSYN) IVPB 3.375 g        3.375 g 12.5 mL/hr over 240 Minutes Intravenous On call to O.R. 04/02/21 1603 04/03/21 0916   04/03/21 0200  piperacillin-tazobactam (ZOSYN) IVPB 3.375 g  Status:  Discontinued        3.375 g 12.5 mL/hr over 240 Minutes Intravenous Every 8 hours 04/02/21 2143 04/09/21 1454   04/02/21 2245  fluconazole (DIFLUCAN) IVPB 200 mg  Status:  Discontinued        200 mg 100 mL/hr over 60 Minutes Intravenous Every 24 hours 04/02/21 2147 04/06/21 1231   04/02/21 2245  vancomycin (VANCOREADY) IVPB 2000 mg/400 mL        2,000 mg 200 mL/hr over 120 Minutes Intravenous  Once 04/02/21 2158 04/03/21 0142   04/02/21 2158  vancomycin variable dose per unstable renal function (pharmacist dosing)  Status:  Discontinued         Does not apply See admin instructions 04/02/21 2159 04/03/21 1501   03/25/21 1200  vancomycin (VANCOCIN) IVPB 1000 mg/200 mL premix        1,000 mg 200 mL/hr over 60 Minutes Intravenous  Once 03/25/21 0512 03/25/21 1206   03/25/21 0600  ceFAZolin (ANCEF) IVPB 2g/100 mL premix        2 g 200 mL/hr over 30 Minutes Intravenous Every 8 hours 03/25/21 0512 03/26/21 2217   03/24/21 1800  ceFAZolin (ANCEF) IVPB 2g/100 mL premix        2 g 200 mL/hr over 30 Minutes Intravenous To Surgery 03/24/21  1738 03/25/21 0308   03/24/21 1800  ceFAZolin (ANCEF) IVPB 2g/100 mL premix  Status:  Discontinued        2 g 200 mL/hr over 30 Minutes Intravenous To Surgery 03/24/21 1734 03/25/21 0512   03/24/21 1800  vancomycin (VANCOREADY) IVPB 1250 mg/250 mL        1,250 mg 166.7 mL/hr over 90 Minutes Intravenous To Surgery 03/24/21 1738 03/24/21 2114       Assessment/Plan: Chronically incarcerated ventral hernia Bowel perforation -POD #8 s/p ex lap, adhesiolysis, extended R hemicolectomy, primary incisional hernia repair, ileostomy creation, abdominal washout, drain placement x2, incisional wound vac placement 30x8x4cm - Dr. Bobbye Morton 04/02/21 -tolerating HH diet and cont Glucerna -TNA off -working with [place] on ileostomy care -midline wound with no further oozing.  Resume BID dressing changes and restart prophylactic heparin today.  If no further bleeding, likely replace wound VAC on Friday -needs to mobilize more.   -abx stopped yesterday -DC RLQ JP drain today, likely to DC other drain tomorrow. -multimodal pain control ordered to minimize IV pain meds -WBC downtrending to 17 -surgically stable at this time  FEN - HH diet, Glucerna VTE - heparin, ID - Vanc/zosyn - stopped 1/3  ABL anemia - hgb 7 today S/p ascending aortic replacement Acute on chronic kidney disease A fib obesity   Henreitta Cea PA-C 04/10/2021

## 2021-04-10 NOTE — Progress Notes (Signed)
PROGRESS NOTE    Dorothy Harris  UIT:[id number] DOB: Sep 18, 1960 DOA: 03/24/2021 PCP: Pcp, No    Chief Complaint  Patient presents with   Chest Pain    Brief Narrative:   Presented to [facility] 12/18 w/ cc: chest pain and SOB. 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 Started on esmolol, seen by CVTS; went to OR where she underwent repair and replacement of ascending aorta and replacement of aortic arch w/ Hemashield graft. Returned to surgical ICU post op on 12/19  She subsequently developed abdominal pain needing to go back to operating room on 12/27.  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  Extended right hemicolectomy and ileostomy.  She eventually transferred to floor and TRH taking over starting 04/06/21 1/1.  Patient has significant short of breath and wheezing, elevated BNP, started on IV Lasix.  Most recent TEE showed ejection fraction more than 65%.   Subjective:  She is laying in bed, denies acute complaints She is on 3liter oxygen, denies sob, reports not on home o2  Colostomy is functioning Drain to left abdomen No lower extremity edema Foley in place with clear urine No fever She is off TPN ,tolerating oral intake  Assessment & Plan:   Principal Problem:   Status post surgery Active Problems:   Acute on chronic diastolic CHF (congestive heart failure) (HCC)   S/P ascending aortic replacement   Encounter for weaning from ventilator (HCC)   Encounter for postanesthesia care   Peritonitis (HCC)   Endotracheally intubated   Acute renal failure superimposed on stage 3a chronic kidney disease (HCC)   Malnutrition of moderate degree   Acute hypoxemic respiratory failure (HCC)   Type A aortic dissection.   she underwent repair and replacement of ascending aorta and replacement of aortic arch w/ Hemashield graft on 12/19 Management per vascular surgery  Peritonitis secondary  to incarcerated colon and perforated cecal volvulus and ischemic colon: Status post extended right 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 Finished abx treatment Off tpn Colonstomy is functioning  Drain still in place Plan per general surgery  Blood loss anemia S/p mulitple prbc ,cryo, ffp plt transfusion  Monitor sign of bleeding, monitor cbc  AKI on CKDIIIa Cr start to trend up, hold lasix for now Renal dosing meds  HTN, bp stable  Afib/RVR on 12/31, paroxysmal  Intraoperative echocardiogram 12/18 with ejection fraction more than 65%. Off amiodarone drip, now on oral amiodarone, not a candidate for anticoagulation  Currently sinus rhythm  Physical deconditioning Patient report still walks prior to coming to the hospital Out of bed Remove indwelling Foley catheter May need CIR placement   Nutritional Assessment:  The patients BMI is: Body mass index is 36.26 kg/m.Marland Kitchen  Seen by dietician.  I agree with the assessment and plan as outlined below:  Nutrition Status: Nutrition Problem: Moderate Malnutrition Etiology: acute illness (aortic disection repair, bowel perforation) Signs/Symptoms: mild muscle depletion, energy intake < or equal to 50% for > or equal to 5 days Interventions: TPN  .    Unresulted Labs (From admission, onward)     Start     Ordered   04/11/21 0500  CBC  Daily,   R     Question:  Specimen collection method  Answer:  Unit=Unit collect   04/10/21 2001   04/11/21 0500  Basic metabolic panel  Daily,   R     Question:  Specimen collection method  Answer:  Unit=Unit collect  04/10/21 2001   04/11/21 0500  Magnesium  Tomorrow morning,   R       Question:  Specimen collection method  Answer:  Unit=Unit collect   04/10/21 2001              DVT prophylaxis: heparin injection 5,000 Units Start: 04/10/21 0600 SCDs Start: 03/27/21 0857   Code Status: full Family Communication: patient  Disposition:   Status is: Inpatient  Dispo: The patient  is from: home              Anticipated d/c is to: possible CIR              Anticipated d/c date is: TBD, needs gen surg and thoracic surgery clearance                Consultants:  Thoracic surgery General surgery Critical care Wound care  Procedures:  As above  Antimicrobials:    Anti-infectives (From admission, onward)    Start     Dose/Rate Route Frequency Ordered Stop   04/08/21 1800  vancomycin (VANCOREADY) IVPB 1500 mg/300 mL  Status:  Discontinued        1,500 mg 150 mL/hr over 120 Minutes Intravenous Every 48 hours 04/08/21 0751 04/09/21 1454   04/06/21 2200  fluconazole (DIFLUCAN) tablet 200 mg  Status:  Discontinued        200 mg Oral Daily at bedtime 04/06/21 1232 04/09/21 1454   04/04/21 1800  vancomycin (VANCOREADY) IVPB 1250 mg/250 mL  Status:  Discontinued        1,250 mg 166.7 mL/hr over 90 Minutes Intravenous Every 48 hours 04/03/21 1501 04/08/21 0751   04/03/21 0600  piperacillin-tazobactam (ZOSYN) IVPB 3.375 g        3.375 g 12.5 mL/hr over 240 Minutes Intravenous On call to O.R. 04/02/21 1603 04/03/21 0916   04/03/21 0200  piperacillin-tazobactam (ZOSYN) IVPB 3.375 g  Status:  Discontinued        3.375 g 12.5 mL/hr over 240 Minutes Intravenous Every 8 hours 04/02/21 2143 04/09/21 1454   04/02/21 2245  fluconazole (DIFLUCAN) IVPB 200 mg  Status:  Discontinued        200 mg 100 mL/hr over 60 Minutes Intravenous Every 24 hours 04/02/21 2147 04/06/21 1231   04/02/21 2245  vancomycin (VANCOREADY) IVPB 2000 mg/400 mL        2,000 mg 200 mL/hr over 120 Minutes Intravenous  Once 04/02/21 2158 04/03/21 0142   04/02/21 2158  vancomycin variable dose per unstable renal function (pharmacist dosing)  Status:  Discontinued         Does not apply See admin instructions 04/02/21 2159 04/03/21 1501   03/25/21 1200  vancomycin (VANCOCIN) IVPB 1000 mg/200 mL premix        1,000 mg 200 mL/hr over 60 Minutes Intravenous  Once 03/25/21 0512 03/25/21 1206   03/25/21 0600   ceFAZolin (ANCEF) IVPB 2g/100 mL premix        2 g 200 mL/hr over 30 Minutes Intravenous Every 8 hours 03/25/21 0512 03/26/21 2217   03/24/21 1800  ceFAZolin (ANCEF) IVPB 2g/100 mL premix        2 g 200 mL/hr over 30 Minutes Intravenous To Surgery 03/24/21 1738 03/25/21 0308   03/24/21 1800  ceFAZolin (ANCEF) IVPB 2g/100 mL premix  Status:  Discontinued        2 g 200 mL/hr over 30 Minutes Intravenous To Surgery 03/24/21 1734 03/25/21 0512   03/24/21 1800  vancomycin (VANCOREADY) IVPB  1250 mg/250 mL        1,250 mg 166.7 mL/hr over 90 Minutes Intravenous To Surgery 03/24/21 1738 03/24/21 2114          Objective: Vitals:   04/10/21 0900 04/10/21 1203 04/10/21 1500 04/10/21 1700  BP: 123/70 100/63 128/73 100/70  Pulse: 77 67 84 61  Resp: $Remo'20 15 17 15  'DFiOd$ Temp:  97.8 F (36.6 C)  97.6 F (36.4 C)  TempSrc:  Oral  Oral  SpO2: 97% 99% 96% 99%  Weight:      Height:        Intake/Output Summary (Last 24 hours) at 04/10/2021 2001 Last data filed at 04/10/2021 1800 Gross per 24 hour  Intake 787.28 ml  Output 2900 ml  Net -2112.72 ml   Filed Weights   04/07/21 0357 04/08/21 0500 04/09/21 0500  Weight: 105 kg 105 kg 105 kg    Examination:  General exam: alert, awake, communicative,calm, NAD Respiratory system: diminished at basis, no wheezing, no rales, no rhonchi. Respiratory effort normal. Cardiovascular system:  RRR, + murmur best heard at right upper sternal border   Gastrointestinal system: + colostomy with formed and liquid stool , + drain to left abdomen.  Normal bowel sounds heard. Central nervous system: Alert and oriented. No focal neurological deficits. Extremities:  no edema Skin: No rashes, lesions or ulcers Psychiatry: Judgement and insight appear normal. Mood & affect appropriate.     Data Reviewed: I have personally reviewed following labs and imaging studies  CBC: Recent Labs  Lab 04/06/21 0514 04/07/21 0307 04/08/21 0546 04/09/21 0500 04/10/21 0402   WBC 20.5* 22.5* 23.1* 17.8* 17.0*  NEUTROABS  --   --   --  13.7* 13.3*  HGB 8.7* 8.2* 8.1* 6.6* 7.0*  HCT 25.7* 24.7* 24.5* 19.7* 21.2*  MCV 94.1 93.6 92.8 94.7 94.6  PLT 265 281 304 284 967    Basic Metabolic Panel: Recent Labs  Lab 04/06/21 0514 04/07/21 0307 04/08/21 0546 04/09/21 0500 04/10/21 0402  NA 144 143 142 140 139  K 3.7 3.8 4.2 4.5 4.9  CL 109 104 102 100 98  CO2 $Re'23 26 30 31 31  'qDv$ GLUCOSE 191* 277* 130* 155* 162*  BUN 63* 56* 49* 49* 46*  CREATININE 2.09* 1.81* 1.83* 1.92* 2.17*  CALCIUM 8.6* 8.3* 8.4* 7.9* 8.1*  MG 2.2 1.8 1.6* 2.0 1.8  PHOS 2.4* 2.5 3.7 4.3 5.1*    GFR: Estimated Creatinine Clearance: 34.4 mL/min (A) (by C-G formula based on SCr of 2.17 mg/dL (H)).  Liver Function Tests: Recent Labs  Lab 04/04/21 0234 04/08/21 0546 04/10/21 0402  AST 65* 37 27  ALT 62* 38 33  ALKPHOS 146* 131* 100  BILITOT 3.2* 2.4* 1.8*  PROT 5.2* 6.1* 5.8*  ALBUMIN 1.6* 1.9* 1.8*    CBG: Recent Labs  Lab 04/09/21 1715 04/09/21 2343 04/10/21 0559 04/10/21 1206 04/10/21 1758  GLUCAP 155* 148* 146* 169* 103*     No results found for this or any previous visit (from the past 240 hour(s)).       Radiology Studies: DG Chest Port 1 View  Result Date: 04/09/2021 CLINICAL DATA:  61 year old female status post abdominal surgery on December 27th for incarcerated hernia. Recent surgery for thoracic aortic repair. EXAM: PORTABLE CHEST 1 VIEW COMPARISON:  Noncontrast CT Chest, Abdomen, and Pelvis 04/08/2021 and earlier. FINDINGS: Portable AP semi upright view at 0554 hours. Changes of CABG. Stable cardiac size and mediastinal contours. Improved bibasilar ventilation since 04/06/2021, larger lung volumes. No  pneumothorax or pulmonary edema. Visualized tracheal air column is within normal limits. Stable right PICC line, right axillary surgical clips. Paucity of bowel gas in the upper abdomen. IMPRESSION: 1. Improved lung volumes and bibasilar ventilation since  04/06/2021. 2. Stable mediastinal contours status post thoracic aortic repair, CABG. 3. No new cardiopulmonary abnormality. Electronically Signed   By: Genevie Ann M.D.   On: 04/09/2021 08:21        Scheduled Meds:  acetaminophen  1,000 mg Oral Q6H   amiodarone  200 mg Oral BID   aspirin  324 mg Oral Daily   [START ON 04/11/2021] carvedilol  3.125 mg Oral BID WC   chlorhexidine  15 mL Mouth Rinse BID   Chlorhexidine Gluconate Cloth  6 each Topical Daily   feeding supplement (GLUCERNA SHAKE)  237 mL Oral TID BM   heparin  5,000 Units Subcutaneous Q8H   insulin aspart  0-24 Units Subcutaneous Q6H   ipratropium-albuterol  3 mL Nebulization BID   mouth rinse  15 mL Mouth Rinse q12n4p   multivitamin with minerals  1 tablet Oral Daily   pantoprazole  40 mg Oral Daily   sodium chloride flush  10-40 mL Intracatheter Q12H   Continuous Infusions:   LOS: 17 days   Time spent: 38mins Greater than 50% of this time was spent in counseling, explanation of diagnosis, planning of further management, and coordination of care.   Voice Recognition Vivian[employer] Sp[employer] dictation system was used to create this note, attempts have been made to correct errors. Please contact the author with questions and/or clarifications.   Florencia Reasons, MD PhD FACP [facility]  Available via Epic secure chat 7am-7pm for nonurgent issues Please page for urgent issues To page the attending provider between 7A-7P or the covering provider during after hours 7P-7A, please log into the web site [email or web address] and access using universal [place] password for that web site. If you do not have the password, please call the hospital operator.    04/10/2021, 8:01 PM

## 2021-04-11 LAB — CBC
HCT: 21.6 % — ABNORMAL LOW (ref 36.0–46.0)
Hemoglobin: 7.1 g/dL — ABNORMAL LOW (ref 12.0–15.0)
MCH: 31.8 pg (ref 26.0–34.0)
MCHC: 32.9 g/dL (ref 30.0–36.0)
MCV: 96.9 fL (ref 80.0–100.0)
Platelets: 367 10*3/uL (ref 150–400)
RBC: 2.23 MIL/uL — ABNORMAL LOW (ref 3.87–5.11)
RDW: 18.2 % — ABNORMAL HIGH (ref 11.5–15.5)
WBC: 17 10*3/uL — ABNORMAL HIGH (ref 4.0–10.5)
nRBC: 0.2 % (ref 0.0–0.2)

## 2021-04-11 LAB — BASIC METABOLIC PANEL
Anion gap: 9 (ref 5–15)
BUN: 42 mg/dL — ABNORMAL HIGH (ref 6–20)
CO2: 32 mmol/L (ref 22–32)
Calcium: 8.2 mg/dL — ABNORMAL LOW (ref 8.9–10.3)
Chloride: 98 mmol/L (ref 98–111)
Creatinine, Ser: 2.34 mg/dL — ABNORMAL HIGH (ref 0.44–1.00)
GFR, Estimated: 23 mL/min — ABNORMAL LOW (ref >60)
Glucose, Bld: 142 mg/dL — ABNORMAL HIGH (ref 70–99)
Potassium: 4.7 mmol/L (ref 3.5–5.1)
Sodium: 139 mmol/L (ref 135–145)

## 2021-04-11 LAB — GLUCOSE, CAPILLARY
Glucose-Capillary: 130 mg/dL — ABNORMAL HIGH (ref 70–99)
Glucose-Capillary: 197 mg/dL — ABNORMAL HIGH (ref 70–99)
Glucose-Capillary: 152 mg/dL — ABNORMAL HIGH (ref 70–99)
Glucose-Capillary: 112 mg/dL — ABNORMAL HIGH (ref 70–99)

## 2021-04-11 LAB — MAGNESIUM: Magnesium: 2 mg/dL (ref 1.7–2.4)

## 2021-04-11 MED ORDER — METOPROLOL TARTRATE 12.5 MG HALF TABLET
12.5000 mg | ORAL_TABLET | Freq: Two times a day (BID) | ORAL | Status: DC
  Administered 2021-04-12 – 2021-04-15 (×7): 12.5 mg via ORAL
  Filled 2021-04-11 (×7): qty 1

## 2021-04-11 NOTE — PMR Pre-admission (Signed)
PMR Admission Coordinator Pre-Admission Assessment  Patient: Dorothy Harris is an 61 y.o., female MRN: [id number] DOB: Jul 08, 1960 Height: 5\' 7"  (170.2 cm) Weight: 109.8 kg  Insurance Information: Uninsured   SECONDARY:     :   Development worker, community:       Phone#:   [facility] [employer] for patients in Inpatient Rehabilitation Facilities with attached Privacy Act [place] Records was provided and verbally reviewed with: N/A  Emergency Contact Information Contact Information     Name Relation Home Work Mobile   Quanah [phone]     Juluis Mire [phone]         Current Medical History  Patient Admitting Diagnosis: Debility  History of Present Illness: A 61 y.o. female admitted 03/24/21 with chest pain. 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 PMH includes CAD, NSTEMI, HTN, tobacco use, prior thoracic aorta dissection for which pt did not follow up with vascular sx.  PT/OT evaluations completed with recommendations for acute inpatient rehab admission.  Complete NIHSS TOTAL: 0  Patient's medical record from [facility] has been reviewed by the rehabilitation admission coordinator and physician.  Past Medical History  Past Medical History:  Diagnosis Date   Abdominal aneurysm    Hernia, epigastric    Hypertension     Has the patient had major surgery during 100 days prior to admission? Yes  Family History   family history includes Breast cancer in her maternal aunt; Diabetes in her father, maternal aunt, and sister; Hypertension in her mother, sister, and sister; Kidney disease in her sister; Stroke in her sister.  Current Medications  Current  Facility-Administered Medications:    acetaminophen (TYLENOL) tablet 1,000 mg, 1,000 mg, Oral, Q6H, Saverio Danker, PA-C, 1,000 mg at 04/11/21 1557   amiodarone (PACERONE) tablet 200 mg, 200 mg, Oral, BID, Gold, Wayne E, PA-C, 200 mg at 04/11/21 3546   aspirin chewable tablet 324 mg, 324 mg, Oral, Daily, Gaye Pollack, MD, 324 mg at 04/11/21 0829   carvedilol (COREG) tablet 3.125 mg, 3.125 mg, Oral, BID WC, Florencia Reasons, MD, 3.125 mg at 04/11/21 1558   chlorhexidine (PERIDEX) 0.12 % solution 15 mL, 15 mL, Mouth Rinse, BID, Candee Furbish, MD, 15 mL at 04/11/21 5681   Chlorhexidine Gluconate Cloth 2 % PADS 6 each, 6 each, Topical, Daily, Gaye Pollack, MD, 6 each at 04/11/21 0947   feeding supplement (GLUCERNA SHAKE) (GLUCERNA SHAKE) liquid 237 mL, 237 mL, Oral, TID BM, Sharen Hones, MD, 237 mL at 04/09/21 2021   heparin injection 5,000 Units, 5,000 Units, Subcutaneous, Q8H, Saverio Danker, PA-C, 5,000 Units at 04/11/21 1558   hydrALAZINE (APRESOLINE) injection 20 mg, 20 mg, Intravenous, Q4H PRN, Agarwala, Einar Grad, MD, 20 mg at 04/08/21 0050   insulin aspart (novoLOG) injection 0-24 Units, 0-24 Units, Subcutaneous, Q6H, Donnamae Jude, RPH, 4 Units at 04/11/21 1143   ipratropium-albuterol (DUONEB) 0.5-2.5 (3) MG/3ML nebulizer solution 3 mL, 3 mL, Nebulization, BID, Sharen Hones, MD, 3 mL at 04/11/21 0835   ipratropium-albuterol (DUONEB) 0.5-2.5 (3) MG/3ML nebulizer solution 3 mL, 3 mL, Nebulization, Q4H PRN, Sharen Hones, MD   LORazepam (ATIVAN) injection 0.5 mg, 0.5 mg, Intravenous, Q6H PRN, Howerter, Justin B, DO, 0.5 mg at 04/09/21 2020   MEDLINE mouth rinse, 15 mL,  Mouth Rinse, q12n4p, Candee Furbish, MD, 15 mL at 04/11/21 1558   methocarbamol (ROBAXIN) tablet 500 mg, 500 mg, Oral, Q6H PRN, Saverio Danker, PA-C   multivitamin with minerals tablet 1 tablet, 1 tablet, Oral, Daily, Sharen Hones, MD, 1 tablet at 04/11/21 0829   ondansetron [facility]) injection 4 mg, 4 mg, Intravenous, Q6H PRN, Barrett,  Erin R, PA-C, 4 mg at 04/04/21 2318   oxyCODONE (Oxy IR/ROXICODONE) immediate release tablet 5-10 mg, 5-10 mg, Oral, Q4H PRN, Saverio Danker, PA-C, 10 mg at 04/11/21 1557   pantoprazole (PROTONIX) EC tablet 40 mg, 40 mg, Oral, Daily, Gaye Pollack, MD, 40 mg at 04/11/21 4193   sodium chloride flush (NS) 0.9 % injection 10-40 mL, 10-40 mL, Intracatheter, Q12H, Bartle, Fernande Boyden, MD, 10 mL at 04/11/21 0830   sodium chloride flush (NS) 0.9 % injection 10-40 mL, 10-40 mL, Intracatheter, PRN, Gaye Pollack, MD  Patients Current Diet:  Diet Order             Diet Heart Room service appropriate? Yes; Fluid consistency: Thin  Diet effective now                   Precautions / Restrictions Precautions Precautions: Fall, Sternal Precaution Booklet Issued: Yes (comment) Precaution Comments: monitor O2, HR; abdominal wound, jp drain Restrictions Weight Bearing Restrictions: No RUE Weight Bearing: Non weight bearing Other Position/Activity Restrictions: sternal precautions   Has the patient had 2 or more falls or a fall with injury in the past year? No  Prior Activity Level Community (5-7x/wk): Went out daily.  worked [employer].  Prior Functional Level Self Care: Did the patient need help bathing, dressing, using the toilet or eating? Independent  Indoor Mobility: Did the patient need assistance with walking from room to room (with or without device)? Independent  Stairs: Did the patient need assistance with internal or external stairs (with or without device)? Independent  Functional Cognition: Did the patient need help planning regular tasks such as shopping or remembering to take medications? Independent  Patient Information Are you of Hispanic, Latino/a,or Spanish origin?: A. No, not of Hispanic, Latino/a, or Spanish origin What is your race?: B. Black or African American Do you need or want an interpreter to communicate with a doctor or health care staff?: 0. No  Patient's Response  To:  Health Literacy and Transportation Is the patient able to respond to health literacy and transportation needs?: Yes Health Literacy - How often do you need to have someone help you when you read instructions, pamphlets, or other written material from your doctor or pharmacy?: Sometimes In the past 12 months, has lack of transportation kept you from medical appointments or from getting medications?: No In the past 12 months, has lack of transportation kept you from meetings, work, or from getting things needed for daily living?: No  Development worker, international aid / [place] Devices/Equipment: None Home Equipment: Shower seat  Prior Device Use: Indicate devices/aids used by the patient prior to current illness, exacerbation or injury? None of the above  Current Functional Level Cognition  Overall Cognitive Status: Within Functional Limits for tasks assessed Orientation Level: Oriented X4 Following Commands: Follows one step commands with increased time General Comments: pleasant, follows directions well, appropriate responses    Extremity Assessment (includes Sensation/Coordination)  Upper Extremity Assessment: LUE deficits/detail LUE Deficits / Details: reports feels stiff and asking for a squeeze ball (provided her one)  Lower Extremity Assessment: Defer to PT evaluation  ADLs  Overall ADL's : Needs assistance/impaired Eating/Feeding: Set up, Sitting, Bed level Eating/Feeding Details (indicate cue type and reason): sat up on EOB to eat a bowl of rice krispies then her true breakfast arrived and she ate this while in bed Grooming: Wash/dry hands, Set up, Bed level Grooming Details (indicate cue type and reason): in recliner Upper Body Bathing: Moderate assistance, Sitting Upper Body Bathing Details (indicate cue type and reason): in recliner Lower Body Bathing: Total assistance Lower Body Bathing Details (indicate cue type and reason): Min A +2 sit<>stand Upper Body  Dressing : Maximal assistance, Sitting Upper Body Dressing Details (indicate cue type and reason): in recliner Lower Body Dressing: Total assistance, Bed level Lower Body Dressing Details (indicate cue type and reason): Min A +2 sit<>stand Toilet Transfer: Moderate assistance, +2 for physical assistance Toilet Transfer Details (indicate cue type and reason): bed (raised)>stand with sara stedy> move to HOB>sit on bed Toileting- Clothing Manipulation and Hygiene: Total assistance Toileting - Clothing Manipulation Details (indicate cue type and reason): min A +2 sit<>stand General ADL Comments: Focus on progression of mobility to sink, declined need for ADLs this AM (already completed)    Mobility  Overal bed mobility: Needs Assistance Bed Mobility: Rolling, Sidelying to Sit, Sit to Sidelying Rolling: Min assist Sidelying to sit: Min assist Supine to sit: Mod assist Sit to sidelying: Min assist General bed mobility comments: minA to complete with max cues to decrease UE use    Transfers  Overall transfer level: Needs assistance Equipment used: 2 person hand held assist Transfers: Sit to/from Stand Sit to Stand: Mod assist, +2 physical assistance, From elevated surface General transfer comment: modA +2 from EOB with increased time to power up without UE use. mod-maxA to complete from low recliner    Ambulation / Gait / Stairs / Wheelchair Mobility  Ambulation/Gait Ambulation/Gait assistance: Min assist, +2 safety/equipment Gait Distance (Feet): 10 Feet (+ 10 ft) Assistive device: Rolling walker (2 wheels) Gait Pattern/deviations: Step-to pattern, Shuffle, Decreased stride length General Gait Details: pt able to complete small shuffling steps away from EOB, then return after small seated rest. minA to manage lines, RW, and stability Gait velocity: decreased Gait velocity interpretation: <1.31 ft/sec, indicative of household ambulator    Posture / Balance Dynamic Sitting  Balance Sitting balance - Comments: sat EOB without UE support Balance Overall balance assessment: Needs assistance Sitting-balance support: Feet supported, Single extremity supported Sitting balance-Leahy Scale: Fair Sitting balance - Comments: sat EOB without UE support Standing balance support: Bilateral upper extremity supported, Reliant on assistive device for balance Standing balance-Leahy Scale: Poor Standing balance comment: dependent on BUE support and external assist    Special needs/care consideration Skin Post op Chest and abdominal incisions/chest incisions with ileostomy   Previous Home Environment (from acute therapy documentation) Living Arrangements: Alone Available Help at Discharge: Friend(s), Available PRN/intermittently, Other (Comment) Type of Home: House Home Layout: One level Home Access: Stairs to enter Entrance Stairs-Rails:  (pt reports she can't remember) Entrance Stairs-Number of Steps: 2 Bathroom Shower/Tub: Chiropodist: Standard Home Care Services: No Additional Comments: pt states unsure of rail at home or assist available, otherwise not using any DME prior to arrival  Discharge Living Setting Plans for Discharge Living Setting: Alone, House (Lives alone) Type of Home at Discharge: House Discharge Home Layout: One level Discharge Home Access: Stairs to enter Entrance Stairs-Rails: Right Entrance Stairs-Number of Steps: 3 steps Discharge Bathroom Shower/Tub: Tub/shower unit, Door Discharge Bathroom Toilet: Standard Does the patient  have any problems obtaining your medications?: No  Social/Family/Support Systems Patient Roles: Other (Comment) (Has a sister in [place] and an aunt.) Contact Information: Delman Cheadle - sister - [phone] Anticipated Caregiver: Friends and neighbors Caregiver Availability: Intermittent Discharge Plan Discussed with Primary Caregiver: Yes Is Caregiver In Agreement with Plan?: Yes Does  Caregiver/Family have Issues with Lodging/Transportation while Pt is in Rehab?: No  Goals Patient/Family Goal for Rehab: PT/OT mod I goals Expected length of stay: 10-12 days Pt/Family Agrees to Admission and willing to participate: Yes Program Orientation Provided & Reviewed with Pt/Caregiver Including Roles  & Responsibilities: Yes  Decrease burden of Care through IP rehab admission: N/A  Possible need for SNF placement upon discharge: Not planned  Patient Condition: I have reviewed medical records from [facility] health, spoken with CM, and patient and family member. I met with patient at the bedside and discussed via phone for inpatient rehabilitation assessment.  Patient will benefit from ongoing PT and OT, can actively participate in 3 hours of therapy a day 5 days of the week, and can make measurable gains during the admission.  Patient will also benefit from the coordinated team approach during an Inpatient Acute Rehabilitation admission.  The patient will receive intensive therapy as well as Rehabilitation physician, nursing, social worker, and care management interventions.  Due to bladder management, bowel management, safety, skin/wound care, disease management, medication administration, pain management, and patient education the patient requires 24 hour a day rehabilitation nursing.  The patient is currently min A mobility and basic ADLs.  Discharge setting and therapy post discharge at home with outpatient is anticipated.  Patient has agreed to participate in the Acute Inpatient Rehabilitation Program and will admit today.  Preadmission Screen Completed By:  Retta Diones, 04/11/2021 5:06 PM ______________________________________________________________________   Discussed status with Dr. Ranell Patrick  on 04/12/21  at 1400 and received approval for admission today.  Admission Coordinator:  Retta Diones, RN,  with day of updates by Clemens Catholic, MS, CCC-SLP time 1400/Date 04/12/21    Assessment/Plan: Diagnosis: Debility Does the need for close, 24 hr/day Medical supervision in concert with the patient's rehab needs make it unreasonable for this patient to be served in a less intensive setting? Yes Co-Morbidities requiring supervision/potential complications: morbid obesity BMI 37.9, acute hypoxemic respiratory failure, moderate malnutrition, peritonitis, s/p intubation Due to bladder management, bowel management, safety, skin/wound care, disease management, medication administration, pain management, and patient education, does the patient require 24 hr/day rehab nursing? Yes Does the patient require coordinated care of a physician, rehab nurse, PT, OT to address physical and functional deficits in the context of the above medical diagnosis(es)? Yes Addressing deficits in the following areas: balance, endurance, locomotion, strength, transferring, bowel/bladder control, bathing, dressing, feeding, grooming, and toileting Can the patient actively participate in an intensive therapy program of at least 3 hrs of therapy 5 days a week? Yes The potential for patient to make measurable gains while on inpatient rehab is excellent Anticipated functional outcomes upon discharge from inpatient rehab: min assist PT, modI to min assist OT, independent SLP Estimated rehab length of stay to reach the above functional goals is: 14-16 days Anticipated discharge destination: Home 10. Overall Rehab/Functional Prognosis: good   MD Signature: Leeroy Cha, MD

## 2021-04-11 NOTE — Progress Notes (Signed)
IP rehab admissions - I met with patient and discussed rehab options.  Patient says she does have insurance, but it is not with [employer].  There is no active policy with [employer].  Called patient's sister and sister is calling patient's employer to see if patient has insurance.  Will follow up as I get more information.  Call for questions.  [phone]

## 2021-04-11 NOTE — Progress Notes (Signed)
Occupational Therapy Treatment Patient Details Name: Dorothy Harris MRN: [id number] DOB: 11/03/1960 Today's Date: 04/11/2021   History of present illness Pt is a 61 y.o. female admitted 03/24/21 with chest pain. 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 PMH includes CAD, NSTEMI, HTN, tobacco use, prior thoracic aorta dissection for which pt did not follow up with vascular sx.   OT comments  Session focused on progression of functional mobility in conjunction with PT to progress ADL independence. Pt continues to requires +2 assist to stand with cues for sternal precautions but able to demo ability to mobilize with light physical assist. Plan to progress endurance during ADLs/mobility in next sessions. Continue to rec CIR   Recommendations for follow up therapy are one component of a multi-disciplinary discharge planning process, led by the attending physician.  Recommendations may be updated based on patient status, additional functional criteria and insurance authorization.    Follow Up Recommendations  Acute inpatient rehab (3hours/day)    Assistance Recommended at Discharge Frequent or constant Supervision/Assistance  Patient can return home with the following  A lot of help with walking and/or transfers;A lot of help with bathing/dressing/bathroom;Assistance with cooking/housework   Equipment Recommendations  Other (comment) (TBD at next venue)    Recommendations for Other Services      Precautions / Restrictions Precautions Precautions: Fall;Sternal Precaution Booklet Issued: Yes (comment) Precaution Comments: monitor O2, HR; abdominal wound, jp drain Restrictions Weight Bearing Restrictions: No       Mobility Bed  Mobility Overal bed mobility: Needs Assistance Bed Mobility: Rolling;Sidelying to Sit;Sit to Sidelying Rolling: Min assist Sidelying to sit: Min assist     Sit to sidelying: Min assist General bed mobility comments: minA to complete with max cues to decrease UE use    Transfers Overall transfer level: Needs assistance Equipment used: 2 person hand held assist Transfers: Sit to/from Stand Sit to Stand: Mod assist;+2 physical assistance;From elevated surface           General transfer comment: modA +2 from EOB with increased time to power up without UE use. mod-maxA to complete from low recliner     Balance Overall balance assessment: Needs assistance Sitting-balance support: Feet supported;Single extremity supported Sitting balance-Leahy Scale: Fair Sitting balance - Comments: sat EOB without UE support   Standing balance support: Bilateral upper extremity supported;Reliant on assistive device for balance Standing balance-Leahy Scale: Poor Standing balance comment: dependent on BUE support and external assist                           ADL either performed or assessed with clinical judgement   ADL Overall ADL's : Needs assistance/impaired                                       General ADL Comments: Focus on progression of mobility to sink, declined need for ADLs this AM (already completed)    Extremity/Trunk Assessment Upper Extremity Assessment Upper Extremity Assessment: LUE deficits/detail LUE Deficits / Details: reports feels stiff and asking for a squeeze ball (provided her one)   Lower Extremity Assessment Lower Extremity Assessment: Defer to PT evaluation  Vision   Vision Assessment?: No apparent visual deficits   Perception     Praxis      Cognition Arousal/Alertness: Awake/alert Behavior During Therapy: WFL for tasks assessed/performed Overall Cognitive Status: Within Functional Limits for tasks assessed Area of  Impairment: Following commands                       Following Commands: Follows one step commands with increased time       General Comments: pleasant, follows directions well, appropriate responses          Exercises     Shoulder Instructions       General Comments VSS on 3L O2    Pertinent Vitals/ Pain       Pain Assessment: Faces Faces Pain Scale: Hurts a little bit Pain Location: incision Pain Descriptors / Indicators: Discomfort Pain Intervention(s): Limited activity within patient's tolerance;Monitored during session  Home Living                                          Prior Functioning/Environment              Frequency  Min 2X/week        Progress Toward Goals  OT Goals(current goals can now be found in the care plan section)  Progress towards OT goals: Progressing toward goals  Acute Rehab OT Goals Patient Stated Goal: regain strength OT Goal Formulation: With patient Time For Goal Achievement: 04/12/21 Potential to Achieve Goals: Good ADL Goals Pt Will Perform Grooming: with supervision;standing Pt Will Perform Upper Body Bathing: with set-up;with supervision;sitting Pt Will Perform Lower Body Bathing: with supervision;with set-up;sit to/from stand Pt Will Perform Upper Body Dressing: with set-up;with supervision;sitting Pt Will Perform Lower Body Dressing: with set-up;with supervision;with adaptive equipment;sit to/from stand Pt Will Transfer to Toilet: with supervision;ambulating;bedside commode Pt Will Perform Toileting - Clothing Manipulation and hygiene: with supervision;sit to/from stand Additional ADL Goal #1: pt will be S in and OOB for basic ADLs following sternal precautions  Plan Discharge plan remains appropriate    Co-evaluation      Reason for Co-Treatment: Complexity of the patient's impairments (multi-system involvement);To address functional/ADL transfers;For patient/therapist safety PT goals  addressed during session: Mobility/safety with mobility;Balance;Proper use of DME OT goals addressed during session: ADL's and self-care;Strengthening/ROM      AM-PAC OT "6 Clicks" Daily Activity     Outcome Measure   Help from another person eating meals?: A Little Help from another person taking care of personal grooming?: A Little Help from another person toileting, which includes using toliet, bedpan, or urinal?: A Lot Help from another person bathing (including washing, rinsing, drying)?: A Lot Help from another person to put on and taking off regular upper body clothing?: A Lot Help from another person to put on and taking off regular lower body clothing?: Total 6 Click Score: 13    End of Session Equipment Utilized During Treatment: Oxygen;Gait belt;Rolling walker (2 wheels)  OT Visit Diagnosis: Unsteadiness on feet (R26.81);Other abnormalities of gait and mobility (R26.89);Muscle weakness (generalized) (M62.81);Pain   Activity Tolerance Patient tolerated treatment well   Patient Left in bed;with call bell/phone within reach;with family/visitor present   Nurse Communication Mobility status        Time: 2836-6294 OT Time Calculation (min): 24 min  Charges: OT General Charges $OT Visit: 1 Visit OT Treatments $Therapeutic Activity:  8-22 mins  Malachy Chamber, OTR/L Acute Rehab Services Office: [phone]   Layla Maw 04/11/2021, 12:37 PM

## 2021-04-11 NOTE — Progress Notes (Signed)
9 Days Post-Op   Subjective/Chief Complaint: Tolerating her solid diet.  Up in her chair this morning eating her breakfast   Objective: Vital signs in last 24 hours: Temp:  [97.6 F (36.4 C)-98.6 F (37 C)] 98.6 F (37 C) (01/05 0758) Pulse Rate:  [61-87] 87 (01/05 0835) Resp:  [13-20] 20 (01/05 0835) BP: (100-128)/(63-80) 113/68 (01/05 0835) SpO2:  [96 %-100 %] 98 % (01/05 0835) FiO2 (%):  [32 %] 32 % (01/05 0835) Weight:  [109.8 kg] 109.8 kg (01/05 0500) Last BM Date: 04/11/21  Intake/Output from previous day: 01/04 0701 - 01/05 0700 In: 720 [P.O.:720] Out: 2700 [Urine:2025; Drains:125; Stool:550] Intake/Output this shift: No intake/output data recorded.  Exam: Abd: soft, appropriately tender, no further bleeding, wound overall clean and packed. LLQ with 15cc of serous output yesterday.  Ileostomy working well with semi-liquid output, 550cc  Lab Results:  Recent Labs    04/10/21 0402 04/11/21 0450  WBC 17.0* 17.0*  HGB 7.0* 7.1*  HCT 21.2* 21.6*  PLT 283 367   BMET Recent Labs    04/10/21 0402 04/11/21 0450  NA 139 139  K 4.9 4.7  CL 98 98  CO2 31 32  GLUCOSE 162* 142*  BUN 46* 42*  CREATININE 2.17* 2.34*  CALCIUM 8.1* 8.2*   PT/INR No results for input(s): LABPROT, INR in the last 72 hours. ABG No results for input(s): PHART, HCO3 in the last 72 hours.  Invalid input(s): PCO2, PO2   Studies/Results: No results found.  Anti-infectives: Anti-infectives (From admission, onward)    Start     Dose/Rate Route Frequency Ordered Stop   04/08/21 1800  vancomycin (VANCOREADY) IVPB 1500 mg/300 mL  Status:  Discontinued        1,500 mg 150 mL/hr over 120 Minutes Intravenous Every 48 hours 04/08/21 0751 04/09/21 1454   04/06/21 2200  fluconazole (DIFLUCAN) tablet 200 mg  Status:  Discontinued        200 mg Oral Daily at bedtime 04/06/21 1232 04/09/21 1454   04/04/21 1800  vancomycin (VANCOREADY) IVPB 1250 mg/250 mL  Status:  Discontinued        1,250  mg 166.7 mL/hr over 90 Minutes Intravenous Every 48 hours 04/03/21 1501 04/08/21 0751   04/03/21 0600  piperacillin-tazobactam (ZOSYN) IVPB 3.375 g        3.375 g 12.5 mL/hr over 240 Minutes Intravenous On call to O.R. 04/02/21 1603 04/03/21 0916   04/03/21 0200  piperacillin-tazobactam (ZOSYN) IVPB 3.375 g  Status:  Discontinued        3.375 g 12.5 mL/hr over 240 Minutes Intravenous Every 8 hours 04/02/21 2143 04/09/21 1454   04/02/21 2245  fluconazole (DIFLUCAN) IVPB 200 mg  Status:  Discontinued        200 mg 100 mL/hr over 60 Minutes Intravenous Every 24 hours 04/02/21 2147 04/06/21 1231   04/02/21 2245  vancomycin (VANCOREADY) IVPB 2000 mg/400 mL        2,000 mg 200 mL/hr over 120 Minutes Intravenous  Once 04/02/21 2158 04/03/21 0142   04/02/21 2158  vancomycin variable dose per unstable renal function (pharmacist dosing)  Status:  Discontinued         Does not apply See admin instructions 04/02/21 2159 04/03/21 1501   03/25/21 1200  vancomycin (VANCOCIN) IVPB 1000 mg/200 mL premix        1,000 mg 200 mL/hr over 60 Minutes Intravenous  Once 03/25/21 0512 03/25/21 1206   03/25/21 0600  ceFAZolin (ANCEF) IVPB 2g/100 mL premix  2 g 200 mL/hr over 30 Minutes Intravenous Every 8 hours 03/25/21 0512 03/26/21 2217   03/24/21 1800  ceFAZolin (ANCEF) IVPB 2g/100 mL premix        2 g 200 mL/hr over 30 Minutes Intravenous To Surgery 03/24/21 1738 03/25/21 0308   03/24/21 1800  ceFAZolin (ANCEF) IVPB 2g/100 mL premix  Status:  Discontinued        2 g 200 mL/hr over 30 Minutes Intravenous To Surgery 03/24/21 1734 03/25/21 0512   03/24/21 1800  vancomycin (VANCOREADY) IVPB 1250 mg/250 mL        1,250 mg 166.7 mL/hr over 90 Minutes Intravenous To Surgery 03/24/21 1738 03/24/21 2114       Assessment/Plan: Chronically incarcerated ventral hernia Bowel perforation -POD #9 s/p ex lap, adhesiolysis, extended R hemicolectomy, primary incisional hernia repair, ileostomy creation, abdominal  washout, drain placement x2, incisional wound vac placement 30x8x4cm - Dr. Bobbye Morton 04/02/21 -tolerating HH diet and cont Glucerna -TNA off -working with Corwith on ileostomy care -midline wound with no further oozing.  BID dressing changes.  If no further bleeding, likely replace wound VAC on Friday -needs to mobilize more.   -abx stopped yesterday -DC RLQ JP drain 1/4, will DC LLQ JP drain today -multimodal pain control working well -WBC downtrending to 17 -surgically stable at this time for DC to rehab when felt medically stable  FEN - HH diet, Glucerna VTE - heparin, ID - Vanc/zosyn - stopped 1/3  ABL anemia - hgb 7 today S/p ascending aortic replacement Acute on chronic kidney disease A fib obesity   Dorothy Cea PA-C 04/11/2021

## 2021-04-11 NOTE — Progress Notes (Addendum)
° °   °  [address]       [place] [address]             [phone]     16 Days Post-op Repair Type A aortic dissection    9 Days Post-Op Procedure(s) (LRB): EXPLORATORY LAPAROTOMY (N/A) EXTENDED COLON RESECTION ILEOSTOMY (N/A) HERNIA REPAIR VENTRAL ADULT LYSIS OF ADHESION, EXTENSIVE (N/A)    Subjective: Awake and alert, up in the bedside chair.  Stood at the bedside with assistance yesterday.  Tolerating PO's.   Objective: Vital signs in last 24 hours: Temp:  [97.6 F (36.4 C)-98.6 F (37 C)] 98.6 F (37 C) (01/05 0758) Pulse Rate:  [61-87] 87 (01/05 0835) Cardiac Rhythm: Normal sinus rhythm (01/05 0815) Resp:  [13-20] 20 (01/05 0835) BP: (100-128)/(63-80) 113/68 (01/05 0835) SpO2:  [96 %-100 %] 98 % (01/05 0835) FiO2 (%):  [32 %] 32 % (01/05 0835) Weight:  [109.8 kg] 109.8 kg (01/05 0500)     Intake/Output from previous day: 01/04 0701 - 01/05 0700 In: 720 [P.O.:720] Out: 2700 [Urine:2025; Drains:125; Stool:550] Intake/Output this shift: No intake/output data recorded.  General appearance: alert, cooperative, and no distress Neurologic: no gross devicits Heart: RRR,  No further atrial fibrillation Lungs: breath sounds are clear anterior.  Abdomen: soft, NT.  Extremities: all well perfused.  Wound: the sternotomy and right subclavian incisions are are open to air and are well approximated and dry.   Lab Results: Recent Labs    04/10/21 0402 04/11/21 0450  WBC 17.0* 17.0*  HGB 7.0* 7.1*  HCT 21.2* 21.6*  PLT 283 367    BMET:  Recent Labs    04/10/21 0402 04/11/21 0450  NA 139 139  K 4.9 4.7  CL 98 98  CO2 31 32  GLUCOSE 162* 142*  BUN 46* 42*  CREATININE 2.17* 2.34*  CALCIUM 8.1* 8.2*     PT/INR: No results for input(s): LABPROT, INR in the last 72 hours. ABG    Component Value Date/Time   PHART 7.327 (L) 04/05/2021 0843   HCO3 21.9 04/05/2021 0843   TCO2 23 04/05/2021 0843   ACIDBASEDEF 4.0 (H) 04/05/2021 0843    O2SAT 92.0 04/05/2021 0843   CBG (last 3)  Recent Labs    04/10/21 1758 04/10/21 2115 04/11/21 0610  GLUCAP 103* 106* 130*     Assessment/Plan: S/P Procedure(s) (LRB): EXPLORATORY LAPAROTOMY (N/A) EXTENDED COLON RESECTION ILEOSTOMY (N/A) HERNIA REPAIR VENTRAL ADULT LYSIS OF ADHESION, EXTENSIVE (N/A)  -POD 16 complex repair of type A dissection presenting with contained rupture. Good distal perfusion. Incisions healing well.   -POD 64 Expl Lap with resection of ischemic bowel, lysis of adhesions, repair of ventral hernia, and ileostomy. Tolerating diet with appropriate function of the ileostomy. Mgt per general surgery.  -Renal- Chronic stage 3 kidney disease- creat trending up but near baseline, Lasix stopped yesterday.   -Post-op atrial fibrillation- maintaining SR on oral amiodarone.   -Deconditioning- slow progress with mobility.  Appreciate assistance from PT/OT. Working toward CIR at discharge.  -PULM- Mild hypoxia, Sats OK on 3Lnc O2..   Continue Duonebs and work on pulmonary hygiene.      LOS: 18 days    Malon Kindle [phone] 04/11/2021   Chart reviewed, patient examined, agree with above. She says she is eating better.

## 2021-04-11 NOTE — Progress Notes (Signed)
Physical Therapy Treatment Patient Details Name: Dorothy Harris MRN: [id number] DOB: 06/11/1960 Today's Date: 04/11/2021   History of Present Illness Pt is a 61 y.o. female admitted 03/24/21 with chest pain. 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 PMH includes CAD, NSTEMI, HTN, tobacco use, prior thoracic aorta dissection for which pt did not follow up with vascular sx.    PT Comments    The pt was able to make great progress with OOB mobility today, ambulating 10 ft with use of RW x2 within the room. All VSS on 3L O2 with this mobility, but the pt did require minA of 2 for safety and stability with gait and mod-maxA of 2 to power up to standing depending on the height of the surface. The pt will continue to benefit from skilled PT acutely and following d/c to maximize functional independence, cardiovascular endurance, and power and strength in LE for transfers without need for physical assist from caregivers.     Recommendations for follow up therapy are one component of a multi-disciplinary discharge planning process, led by the attending physician.  Recommendations may be updated based on patient status, additional functional criteria and insurance authorization.  Follow Up Recommendations  Acute inpatient rehab (3hours/day)     Assistance Recommended at Discharge Frequent or constant Supervision/Assistance  Patient can return home with the following Two people to help with walking and/or transfers;A lot of help with bathing/dressing/bathroom;Assistance with cooking/housework   Equipment Recommendations  Other (comment) (defer to post acute)    Recommendations for Other Services       Precautions / Restrictions  Precautions Precautions: Fall;Sternal Precaution Booklet Issued: Yes (comment) Precaution Comments: monitor O2, HR; abdominal wound Restrictions Weight Bearing Restrictions: No     Mobility  Bed Mobility Overal bed mobility: Needs Assistance Bed Mobility: Rolling;Sidelying to Sit;Sit to Sidelying Rolling: Min assist Sidelying to sit: Min assist     Sit to sidelying: Min assist General bed mobility comments: minA to complete with max cues to decrease UE use    Transfers Overall transfer level: Needs assistance Equipment used: 2 person hand held assist Transfers: Sit to/from Stand Sit to Stand: Mod assist;+2 physical assistance;From elevated surface           General transfer comment: modA +2 from EOB with increased time to power up without UE use. mod-maxA to complete from low recliner    Ambulation/Gait Ambulation/Gait assistance: Min assist;+2 safety/equipment Gait Distance (Feet): 10 Feet (+ 10 ft) Assistive device: Rolling walker (2 wheels) Gait Pattern/deviations: Step-to pattern;Shuffle;Decreased stride length Gait velocity: decreased Gait velocity interpretation: <1.31 ft/sec, indicative of household ambulator   General Gait Details: pt able to complete small shuffling steps away from EOB, then return after small seated rest. minA to manage lines, RW, and stability      Balance Overall balance assessment: Needs assistance Sitting-balance support: Feet supported;Single extremity supported Sitting balance-Leahy Scale: Fair Sitting balance - Comments: sat EOB without UE support   Standing balance support: Bilateral upper extremity supported;Reliant on assistive device for balance Standing balance-Leahy Scale: Poor Standing balance comment: dependent on BUE support and external assist                            Cognition  Arousal/Alertness: Awake/alert Behavior During Therapy: WFL for tasks assessed/performed Overall Cognitive Status: Within  Functional Limits for tasks assessed Area of Impairment: Following commands                       Following Commands: Follows one step commands with increased time       General Comments: pleasant, follows directions well, appropriate responses        Exercises      General Comments General comments (skin integrity, edema, etc.): VSS on 3L O2      Pertinent Vitals/Pain Pain Assessment: Faces Faces Pain Scale: Hurts a little bit Pain Location: incision Pain Descriptors / Indicators: Discomfort Pain Intervention(s): Limited activity within patient's tolerance;Monitored during session;Repositioned     PT Goals (current goals can now be found in the care plan section) Acute Rehab PT Goals Patient Stated Goal: Return home PT Goal Formulation: With patient Time For Goal Achievement: 04/18/21 Potential to Achieve Goals: Good Progress towards PT goals: Progressing toward goals    Frequency    Min 3X/week      PT Plan Current plan remains appropriate    Co-evaluation PT/OT/SLP Co-Evaluation/Treatment: Yes Reason for Co-Treatment: Complexity of the patient's impairments (multi-system involvement);To address functional/ADL transfers;For patient/therapist safety PT goals addressed during session: Mobility/safety with mobility;Balance;Proper use of DME        AM-PAC PT "6 Clicks" Mobility   Outcome Measure  Help needed turning from your back to your side while in a flat bed without using bedrails?: A Little Help needed moving from lying on your back to sitting on the side of a flat bed without using bedrails?: A Little Help needed moving to and from a bed to a chair (including a wheelchair)?: Total Help needed standing up from a chair using your arms (e.g., wheelchair or bedside chair)?: Total Help needed to walk in hospital room?: Total Help needed climbing 3-5 steps with a railing? : Total 6 Click Score: 10    End of Session Equipment Utilized During  Treatment: Gait belt;Oxygen Activity Tolerance: Patient tolerated treatment well Patient left: in bed;with call bell/phone within reach Nurse Communication: Mobility status PT Visit Diagnosis: Other abnormalities of gait and mobility (R26.89);Muscle weakness (generalized) (M62.81)     Time: 9470-7615 PT Time Calculation (min) (ACUTE ONLY): 23 min  Charges:  $Therapeutic Exercise: 8-22 mins                     West Carbo, PT, DPT   Acute Rehabilitation Department Pager #: [phone]   Sandra Cockayne 04/11/2021, 11:20 AM

## 2021-04-11 NOTE — Progress Notes (Signed)
PROGRESS NOTE    Dorothy Harris  BRA:[id number] DOB: 1960/08/19 DOA: 03/24/2021 PCP: Pcp, No    Chief Complaint  Patient presents with   Chest Pain    Brief Narrative:   Presented to [facility] 12/18 w/ cc: chest pain and SOB. 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 Started on esmolol, seen by CVTS; went to OR where she underwent repair and replacement of ascending aorta and replacement of aortic arch w/ Hemashield graft. Returned to surgical ICU post op on 12/19  She subsequently developed abdominal pain needing to go back to operating room on 12/27.  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  Extended right hemicolectomy and ileostomy.  She eventually transferred to floor and TRH taking over starting 04/06/21 1/1.  Patient has significant short of breath and wheezing, elevated BNP, started on IV Lasix.  Most recent TEE showed ejection fraction more than 65%.   Subjective:  Foley removed yesterday, reports no issue with urination,Colostomy is functioning She is on 3liter oxygen, denies sob, reports not on home o2 , she denies sob,  She report was up in chair for about 2hrs this am Currently She is laying in bed, denies acute complaints No lower extremity edema No fever tolerating oral intake  Assessment & Plan:   Principal Problem:   Status post surgery Active Problems:   Acute on chronic diastolic CHF (congestive heart failure) (HCC)   S/P ascending aortic replacement   Encounter for weaning from ventilator ([place])   Encounter for postanesthesia care   Peritonitis ([place])   Endotracheally intubated   Acute renal failure superimposed on stage 3a chronic kidney disease ([place])   Malnutrition of moderate degree   Acute hypoxemic respiratory failure ([place])   Type A aortic dissection.   she underwent repair and replacement of ascending aorta and replacement of aortic arch w/ Hemashield graft on  12/19 Management per vascular surgery  Peritonitis secondary to incarcerated colon and perforated cecal volvulus and ischemic colon: Status post extended right 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 Finished abx treatment Off tpn Colonstomy is functioning  Plan per general surgery  Blood loss anemia S/p mulitple prbc ,cryo, ffp plt transfusion  Monitor sign of bleeding, monitor cbc  AKI on CKDIIIa Cr start to trend up, hold lasix for now Renal dosing meds  HTN, bp stable  Afib/RVR on 12/31, paroxysmal  Intraoperative echocardiogram 12/18 with ejection fraction more than 65%. Off amiodarone drip, now on oral amiodarone, not a candidate for anticoagulation currently , need to be address after being discharged  Currently sinus rhythm  Physical deconditioning Patient report still walks prior to coming to the hospital Out of bed Wean oxygen Working on CIR placement   Nutritional Assessment:  The patients BMI is: Body mass index is 37.9 kg/m.Marland Kitchen  Seen by dietician.  I agree with the assessment and plan as outlined below:  Nutrition Status: Nutrition Problem: Moderate Malnutrition Etiology: acute illness (aortic disection repair, bowel perforation) Signs/Symptoms: mild muscle depletion, energy intake < or equal to 50% for > or equal to 5 days Interventions: TPN  .    Unresulted Labs (From admission, onward)     Start     Ordered   04/11/21 0500  CBC  Daily,   R     Question:  Specimen collection method  Answer:  Unit=Unit collect   04/10/21 2001   04/11/21 8811  Basic metabolic panel  Daily,   R  Question:  Specimen collection method  Answer:  Unit=Unit collect   04/10/21 2001              DVT prophylaxis: heparin injection 5,000 Units Start: 04/10/21 0600 SCDs Start: 03/27/21 0857   Code Status: full Family Communication: patient  Disposition:   Status is: Inpatient  Dispo: The patient is from: home              Anticipated d/c is to: possible CIR               Anticipated d/c date is: medically stable to d/c to CIR,                 Consultants:  Thoracic surgery General surgery Critical care Wound care  Procedures:  As above  Antimicrobials:    Anti-infectives (From admission, onward)    Start     Dose/Rate Route Frequency Ordered Stop   04/08/21 1800  vancomycin (VANCOREADY) IVPB 1500 mg/300 mL  Status:  Discontinued        1,500 mg 150 mL/hr over 120 Minutes Intravenous Every 48 hours 04/08/21 0751 04/09/21 1454   04/06/21 2200  fluconazole (DIFLUCAN) tablet 200 mg  Status:  Discontinued        200 mg Oral Daily at bedtime 04/06/21 1232 04/09/21 1454   04/04/21 1800  vancomycin (VANCOREADY) IVPB 1250 mg/250 mL  Status:  Discontinued        1,250 mg 166.7 mL/hr over 90 Minutes Intravenous Every 48 hours 04/03/21 1501 04/08/21 0751   04/03/21 0600  piperacillin-tazobactam (ZOSYN) IVPB 3.375 g        3.375 g 12.5 mL/hr over 240 Minutes Intravenous On call to O.R. 04/02/21 1603 04/03/21 0916   04/03/21 0200  piperacillin-tazobactam (ZOSYN) IVPB 3.375 g  Status:  Discontinued        3.375 g 12.5 mL/hr over 240 Minutes Intravenous Every 8 hours 04/02/21 2143 04/09/21 1454   04/02/21 2245  fluconazole (DIFLUCAN) IVPB 200 mg  Status:  Discontinued        200 mg 100 mL/hr over 60 Minutes Intravenous Every 24 hours 04/02/21 2147 04/06/21 1231   04/02/21 2245  vancomycin (VANCOREADY) IVPB 2000 mg/400 mL        2,000 mg 200 mL/hr over 120 Minutes Intravenous  Once 04/02/21 2158 04/03/21 0142   04/02/21 2158  vancomycin variable dose per unstable renal function (pharmacist dosing)  Status:  Discontinued         Does not apply See admin instructions 04/02/21 2159 04/03/21 1501   03/25/21 1200  vancomycin (VANCOCIN) IVPB 1000 mg/200 mL premix        1,000 mg 200 mL/hr over 60 Minutes Intravenous  Once 03/25/21 0512 03/25/21 1206   03/25/21 0600  ceFAZolin (ANCEF) IVPB 2g/100 mL premix        2 g 200 mL/hr over 30 Minutes  Intravenous Every 8 hours 03/25/21 0512 03/26/21 2217   03/24/21 1800  ceFAZolin (ANCEF) IVPB 2g/100 mL premix        2 g 200 mL/hr over 30 Minutes Intravenous To Surgery 03/24/21 1738 03/25/21 0308   03/24/21 1800  ceFAZolin (ANCEF) IVPB 2g/100 mL premix  Status:  Discontinued        2 g 200 mL/hr over 30 Minutes Intravenous To Surgery 03/24/21 1734 03/25/21 0512   03/24/21 1800  vancomycin (VANCOREADY) IVPB 1250 mg/250 mL        1,250 mg 166.7 mL/hr over 90 Minutes Intravenous To Surgery 03/24/21  1738 03/24/21 2114          Objective: Vitals:   04/11/21 0758 04/11/21 0835 04/11/21 1122 04/11/21 1633  BP: 113/68 113/68 121/67 116/70  Pulse: 78 87 73 71  Resp: $Remo'20 20 20 16  'xGFtq$ Temp: 98.6 F (37 C)  98.3 F (36.8 C) 97.7 F (36.5 C)  TempSrc: Oral  Oral Oral  SpO2: 98% 98% 99% 100%  Weight:      Height:        Intake/Output Summary (Last 24 hours) at 04/11/2021 1805 Last data filed at 04/11/2021 1633 Gross per 24 hour  Intake 240 ml  Output 1250 ml  Net -1010 ml   Filed Weights   04/08/21 0500 04/09/21 0500 04/11/21 0500  Weight: 105 kg 105 kg 109.8 kg    Examination:  General exam: alert, awake, communicative,calm, NAD Respiratory system: diminished at basis, no wheezing, no rales, no rhonchi. Respiratory effort normal. Cardiovascular system:  RRR, + murmur best heard at right upper sternal border   Gastrointestinal system: + colostomy with formed and liquid stool ,  Normal bowel sounds heard. Central nervous system: Alert and oriented. No focal neurological deficits. Extremities:  no edema Skin: No rashes, lesions or ulcers Psychiatry: Judgement and insight appear normal. Mood & affect appropriate.     Data Reviewed: I have personally reviewed following labs and imaging studies  CBC: Recent Labs  Lab 04/07/21 0307 04/08/21 0546 04/09/21 0500 04/10/21 0402 04/11/21 0450  WBC 22.5* 23.1* 17.8* 17.0* 17.0*  NEUTROABS  --   --  13.7* 13.3*  --   HGB 8.2*  8.1* 6.6* 7.0* 7.1*  HCT 24.7* 24.5* 19.7* 21.2* 21.6*  MCV 93.6 92.8 94.7 94.6 96.9  PLT 281 304 284 283 188    Basic Metabolic Panel: Recent Labs  Lab 04/06/21 0514 04/07/21 0307 04/08/21 0546 04/09/21 0500 04/10/21 0402 04/11/21 0450  NA 144 143 142 140 139 139  K 3.7 3.8 4.2 4.5 4.9 4.7  CL 109 104 102 100 98 98  CO2 $Re'23 26 30 31 31 'vBt$ 32  GLUCOSE 191* 277* 130* 155* 162* 142*  BUN 63* 56* 49* 49* 46* 42*  CREATININE 2.09* 1.81* 1.83* 1.92* 2.17* 2.34*  CALCIUM 8.6* 8.3* 8.4* 7.9* 8.1* 8.2*  MG 2.2 1.8 1.6* 2.0 1.8 2.0  PHOS 2.4* 2.5 3.7 4.3 5.1*  --     GFR: Estimated Creatinine Clearance: 32.7 mL/min (A) (by C-G formula based on SCr of 2.34 mg/dL (H)).  Liver Function Tests: Recent Labs  Lab 04/08/21 0546 04/10/21 0402  AST 37 27  ALT 38 33  ALKPHOS 131* 100  BILITOT 2.4* 1.8*  PROT 6.1* 5.8*  ALBUMIN 1.9* 1.8*    CBG: Recent Labs  Lab 04/10/21 1758 04/10/21 2115 04/11/21 0610 04/11/21 1119 04/11/21 1629  GLUCAP 103* 106* 130* 197* 152*     No results found for this or any previous visit (from the past 240 hour(s)).       Radiology Studies: No results found.      Scheduled Meds:  acetaminophen  1,000 mg Oral Q6H   amiodarone  200 mg Oral BID   aspirin  324 mg Oral Daily   carvedilol  3.125 mg Oral BID WC   chlorhexidine  15 mL Mouth Rinse BID   Chlorhexidine Gluconate Cloth  6 each Topical Daily   feeding supplement (GLUCERNA SHAKE)  237 mL Oral TID BM   heparin  5,000 Units Subcutaneous Q8H   insulin aspart  0-24 Units Subcutaneous Q6H  ipratropium-albuterol  3 mL Nebulization BID   mouth rinse  15 mL Mouth Rinse q12n4p   multivitamin with minerals  1 tablet Oral Daily   pantoprazole  40 mg Oral Daily   sodium chloride flush  10-40 mL Intracatheter Q12H   Continuous Infusions:   LOS: 18 days   Time spent: 56mins Greater than 50% of this time was spent in counseling, explanation of diagnosis, planning of further management,  and coordination of care.   Voice Recognition Viviann Sp[employer] dictation system was used to create this note, attempts have been made to correct errors. Please contact the author with questions and/or clarifications.   Florencia Reasons, MD PhD FACP [facility]  Available via Epic secure chat 7am-7pm for nonurgent issues Please page for urgent issues To page the attending provider between 7A-7P or the covering provider during after hours 7P-7A, please log into the web site [email or web address] and access using universal [place] password for that web site. If you do not have the password, please call the hospital operator.    04/11/2021, 6:05 PM

## 2021-04-12 LAB — CBC
HCT: 21.5 % — ABNORMAL LOW (ref 36.0–46.0)
Hemoglobin: 6.8 g/dL — CL (ref 12.0–15.0)
MCH: 31.1 pg (ref 26.0–34.0)
MCHC: 31.6 g/dL (ref 30.0–36.0)
MCV: 98.2 fL (ref 80.0–100.0)
Platelets: 410 10*3/uL — ABNORMAL HIGH (ref 150–400)
RBC: 2.19 MIL/uL — ABNORMAL LOW (ref 3.87–5.11)
RDW: 17.8 % — ABNORMAL HIGH (ref 11.5–15.5)
WBC: 15.7 10*3/uL — ABNORMAL HIGH (ref 4.0–10.5)
nRBC: 0 % (ref 0.0–0.2)

## 2021-04-12 LAB — BASIC METABOLIC PANEL
Anion gap: 9 (ref 5–15)
BUN: 41 mg/dL — ABNORMAL HIGH (ref 6–20)
CO2: 30 mmol/L (ref 22–32)
Calcium: 8.1 mg/dL — ABNORMAL LOW (ref 8.9–10.3)
Chloride: 100 mmol/L (ref 98–111)
Creatinine, Ser: 2.25 mg/dL — ABNORMAL HIGH (ref 0.44–1.00)
GFR, Estimated: 24 mL/min — ABNORMAL LOW (ref >60)
Glucose, Bld: 109 mg/dL — ABNORMAL HIGH (ref 70–99)
Potassium: 4.5 mmol/L (ref 3.5–5.1)
Sodium: 139 mmol/L (ref 135–145)

## 2021-04-12 LAB — GLUCOSE, CAPILLARY
Glucose-Capillary: 99 mg/dL (ref 70–99)
Glucose-Capillary: 123 mg/dL — ABNORMAL HIGH (ref 70–99)
Glucose-Capillary: 125 mg/dL — ABNORMAL HIGH (ref 70–99)
Glucose-Capillary: 124 mg/dL — ABNORMAL HIGH (ref 70–99)

## 2021-04-12 LAB — PREPARE RBC (CROSSMATCH)

## 2021-04-12 MED ORDER — IPRATROPIUM-ALBUTEROL 0.5-2.5 (3) MG/3ML IN SOLN: 3.0000 mL | Freq: Two times a day (BID) | RESPIRATORY_TRACT | Status: DC

## 2021-04-12 MED ORDER — ASPIRIN 81 MG PO CHEW: 324.0000 mg | CHEWABLE_TABLET | Freq: Every day | ORAL | Status: DC

## 2021-04-12 MED ORDER — GLUCERNA SHAKE PO LIQD: 237.0000 mL | Freq: Three times a day (TID) | ORAL | 0 refills | Status: DC

## 2021-04-12 MED ORDER — FUROSEMIDE 40 MG PO TABS: 40.0000 mg | ORAL_TABLET | ORAL | 0 refills | Status: DC

## 2021-04-12 MED ORDER — AMIODARONE HCL 200 MG PO TABS: 200.0000 mg | ORAL_TABLET | Freq: Two times a day (BID) | ORAL | Status: DC

## 2021-04-12 MED ORDER — AMIODARONE HCL 200 MG PO TABS: 200.0000 mg | ORAL_TABLET | Freq: Every day | ORAL | Status: DC

## 2021-04-12 MED ORDER — INSULIN ASPART 100 UNIT/ML IJ SOLN: 0.0000 [IU] | Freq: Four times a day (QID) | INTRAMUSCULAR | 11 refills | Status: DC

## 2021-04-12 MED ORDER — SODIUM CHLORIDE 0.9% IV SOLUTION
Freq: Once | INTRAVENOUS | Status: AC
Start: 2021-04-12 — End: 2021-04-12

## 2021-04-12 MED ORDER — ADULT MULTIVITAMIN W/MINERALS CH: 1.0000 | ORAL_TABLET | Freq: Every day | ORAL | Status: AC

## 2021-04-12 MED ORDER — PANTOPRAZOLE SODIUM 40 MG PO TBEC: 40.0000 mg | DELAYED_RELEASE_TABLET | Freq: Every day | ORAL | Status: DC

## 2021-04-12 NOTE — Progress Notes (Signed)
Notified on-call physician of critical lab hgb 6.8. Patient has no signs of overt bleeding and is asymptomatic. Will continue to monitor.

## 2021-04-12 NOTE — Progress Notes (Addendum)
[address]       [place] [id number]             [phone]      10 Days Post-Op Procedure(s) (LRB): EXPLORATORY LAPAROTOMY (N/A) EXTENDED COLON RESECTION ILEOSTOMY (N/A) HERNIA REPAIR VENTRAL ADULT LYSIS OF ADHESION, EXTENSIVE (N/A) Subjective: Slowly feeling better./stronger  Objective: Vital signs in last 24 hours: Temp:  [97.7 F (36.5 C)-98.6 F (37 C)] 97.7 F (36.5 C) (01/05 2346) Pulse Rate:  [68-87] 74 (01/05 2346) Cardiac Rhythm: Heart block (01/05 1900) Resp:  [16-20] 17 (01/05 2346) BP: (110-121)/(63-74) 121/63 (01/05 2346) SpO2:  [97 %-100 %] 97 % (01/05 2346) FiO2 (%):  [32 %] 32 % (01/05 0835)  Hemodynamic parameters for last 24 hours:    Intake/Output from previous day: 01/05 0701 - 01/06 0700 In: 240 [P.O.:240] Out: 1450 [Urine:700; Drains:50; Stool:700] Intake/Output this shift: No intake/output data recorded.  General appearance: alert, cooperative, and no distress Heart: regular rate and rhythm Lungs: clear anteriorly Abdomen: + loose stool in ostomy bag Extremities: no edema Wound: incis healing well(sternal), abdominal wound dressing intact  Lab Results: Recent Labs    04/11/21 0450 04/12/21 0445  WBC 17.0* 15.7*  HGB 7.1* 6.8*  HCT 21.6* 21.5*  PLT 367 410*   BMET:  Recent Labs    04/11/21 0450 04/12/21 0445  NA 139 139  K 4.7 4.5  CL 98 100  CO2 32 30  GLUCOSE 142* 109*  BUN 42* 41*  CREATININE 2.34* 2.25*  CALCIUM 8.2* 8.1*    PT/INR: No results for input(s): LABPROT, INR in the last 72 hours. ABG    Component Value Date/Time   PHART 7.327 (L) 04/05/2021 0843   HCO3 21.9 04/05/2021 0843   TCO2 23 04/05/2021 0843   ACIDBASEDEF 4.0 (H) 04/05/2021 0843   O2SAT 92.0 04/05/2021 0843   CBG (last 3)  Recent Labs    04/11/21 1629 04/11/21 2145 04/12/21 0542  GLUCAP 152* 112* 99    Meds Scheduled Meds:  acetaminophen  1,000 mg Oral Q6H   amiodarone  200 mg Oral BID   aspirin  324 mg  Oral Daily   chlorhexidine  15 mL Mouth Rinse BID   Chlorhexidine Gluconate Cloth  6 each Topical Daily   feeding supplement (GLUCERNA SHAKE)  237 mL Oral TID BM   heparin  5,000 Units Subcutaneous Q8H   insulin aspart  0-24 Units Subcutaneous Q6H   ipratropium-albuterol  3 mL Nebulization BID   mouth rinse  15 mL Mouth Rinse q12n4p   metoprolol tartrate  12.5 mg Oral BID   multivitamin with minerals  1 tablet Oral Daily   pantoprazole  40 mg Oral Daily   sodium chloride flush  10-40 mL Intracatheter Q12H   Continuous Infusions: PRN Meds:.hydrALAZINE, ipratropium-albuterol, LORazepam, methocarbamol, ondansetron (ZOFRAN) IV, oxyCODONE, sodium chloride flush  Xrays No results found.  Assessment/Plan: S/P Procedure(s) (LRB): EXPLORATORY LAPAROTOMY (N/A) EXTENDED COLON RESECTION ILEOSTOMY (N/A) HERNIA REPAIR VENTRAL ADULT LYSIS OF ADHESION, EXTENSIVE (N/A)  POD#17/10 1 afebrile, VSS, sats good on 3 liters 2 JP 50 cc 3 UOP 700/24h, creat trending down, creat 2.25, lasix on hold 4 ileostomy- 700 cc/24 h 5 leukocytosis trend improving 6 H/H has remained low at 6.8/21.5- consider transfusion 7 TNA is off, HH diet tolerated 8 Abx have been stopped 9 medical management per primary   LOS: 19 days    Dorothy Harris 04/12/2021    Chart reviewed, patient examined, agree with  above. No active problems from a cardiac surgery standpoint. I agree that she would benefit from transfusion with Hgb of 6.8 and should be on some iron.

## 2021-04-12 NOTE — Progress Notes (Signed)
Inpatient Rehab Admissions Coordinator:  ? ?I have a bed for this Pt. On CIR today. RN may call report to [phone]. ? ?Oneil Behney, MS, CCC-SLP ?Rehab Admissions Coordinator  ?[phone] (celll) ?[phone] (office) ?

## 2021-04-12 NOTE — Discharge Summary (Signed)
Discharge Summary  Dorothy Harris KVQ:[id number] DOB: 22-Sep-1960  PCP: Pcp, No  Admit date: 03/24/2021 Discharge date: 04/12/2021  Time spent: 78mins, more than 50% time spent on coordination of care.   Patient is discharged to inpatient rehab here at  [facility]  Recommendations for Outpatient Follow-up after discharge from inpatient rehab:  F/u with PCP , repeat cbc/bmp at follow up F/u with cardiology Dr Oval Linsey for new diagnosis of afib, has an appointment on 1/31 F/u with thoracic surgery , has an appointment for cxr on 1/18 by thoracic surgery     Discharge Diagnoses:  Active Hospital Problems   Diagnosis Date Noted   Status post surgery 03/24/2021   Acute hypoxemic respiratory failure ([place]) 04/07/2021   Malnutrition of moderate degree 04/03/2021   Encounter for postanesthesia care 04/02/2021   Peritonitis ([place]) 04/02/2021   Endotracheally intubated 04/02/2021   Acute renal failure superimposed on stage 3a chronic kidney disease ([place]) 04/02/2021   S/P ascending aortic replacement 03/25/2021   Encounter for weaning from ventilator [facility])    Acute on chronic diastolic CHF (congestive heart failure) [facility])     Resolved Hospital Problems  No resolved problems to display.    Discharge Condition: stable  Diet recommendation: heart healthy/carb modified  Filed Weights   04/08/21 0500 04/09/21 0500 04/11/21 0500  Weight: 105 kg 105 kg 109.8 kg    History of present illness:  Presented to [facility] 12/18 w/ cc: chest pain and SOB. 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 Started on esmolol, seen by CVTS; went to OR where she underwent repair and replacement of ascending aorta and replacement of aortic arch w/ Hemashield graft. Returned to surgical ICU post op on 12/19   She subsequently developed abdominal pain needing to go back to operating room on 12/27.  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  Extended right hemicolectomy and ileostomy.   She eventually transferred to floor and TRH taking over starting 04/06/21  1/1.  Patient has significant short of breath and wheezing, elevated BNP, started on IV Lasix.  Most recent TEE showed ejection fraction more than 65%.  She has improved, cleared by surgery to go to [facility] Course:  Principal Problem:   Status post surgery Active Problems:   Acute on chronic diastolic CHF (congestive heart failure) (HCC)   S/P ascending aortic replacement   Encounter for weaning from ventilator [facility])   Encounter for postanesthesia care   Peritonitis ([place])   Endotracheally intubated   Acute renal failure superimposed on stage 3a chronic kidney disease ([place])   Malnutrition of moderate degree   Acute hypoxemic respiratory failure (HCC)   Type A aortic dissection.   she underwent repair and replacement of ascending aorta and replacement of aortic arch w/ Hemashield graft on 12/19 She is scheduled to have repeat cxr on 1/18 per thoracic surgery Management per thoracic surgery   Peritonitis secondary to incarcerated colon and perforated cecal volvulus and ischemic colon: Status post extended right 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 Finished abx treatment Off tpn Colonstomy is functioning Wound vac placed  Plan per general surgery   Blood loss anemia S/p mulitple prbc ,cryo, ffp plt transfusion  Hgb dropped to 6.8 on 1/6, received  another prbc, some mild injection site bleeding, no other overt sign of bleeding  Monitor sign of bleeding, monitor cbc   Afib/RVR on 12/31, paroxysmal  Intraoperative echocardiogram 12/18 with ejection fraction more than 65%.  Off amiodarone drip, now on oral amiodarone, not a candidate for anticoagulation currently , currently on asa 325 daily need to be address after being discharged  Currently sinus rhythm Has an appointment with cardiology on 1/31   Acute on chronic diastolic chf/acute  hypoxic respiratory failure -Developed sob with hypxia on 1/1, likely due to volume overload from blood product transfusion and tpn, she received iv lasix Improved Iv Lasix held due to elevation of cr Wean oxygen Monitor volume status  Discharge on oral lasix qMWF for two weeks, then might need lasix prn     AKI on CKDIIIa Cr improved, monitor renal function and volume status  while on lasix Renal dosing meds   HTN, bp stable  HLD Continue lipitor    Physical deconditioning Patient report still walks prior to coming to the hospital Out of bed Wean oxygen CIR placement     Nutritional Assessment:   The patients BMI is: Body mass index is 37.9 kg/m.Marland Kitchen   Seen by dietician.  I agree with the assessment and plan as outlined below:   Nutrition Status: Nutrition Problem: Moderate Malnutrition Etiology: acute illness (aortic disection repair, bowel perforation) Signs/Symptoms: mild muscle depletion, energy intake < or equal to 50% for > or equal to 5 days Interventions:  was on TPN, now tolerating oral intake    Consultants:  Thoracic surgery General surgery Critical care Wound care  Discharge Exam: BP 121/66    Pulse 60    Temp 98.2 F (36.8 C) (Oral)    Resp 15    Ht $R'5\' 7"'xf$  (1.702 m)    Wt 109.8 kg    SpO2 100%    BMI 37.90 kg/m   General: NAD, AAOx3 Cardiovascular: RRR Respiratory: diminished, no rales, no rhonchi, no wheezing  Discharge Instructions You were cared for by a hospitalist during your hospital stay. If you have any questions about your discharge medications or the care you received while you were in the hospital after you are discharged, you can call the unit and asked to speak with the hospitalist on call if the hospitalist that took care of you is not available. Once you are discharged, your primary care physician will handle any further medical issues. Please note that NO REFILLS for any discharge medications will be authorized once you are discharged,  as it is imperative that you return to your primary care physician (or establish a relationship with a primary care physician if you do not have one) for your aftercare needs so that they can reassess your need for medications and monitor your lab values.  Discharge Instructions     Amb Referral to Cardiac Rehabilitation   Complete by: As directed    Diagnosis: Other   After initial evaluation and assessments completed: Virtual Based Care may be provided alone or in conjunction with Phase 2 Cardiac Rehab based on patient barriers.: Yes   Diet - low sodium heart healthy   Complete by: As directed    Carb modified diet   Discharge wound care:   Complete by: As directed    Per general surgery On continuous wound vac at 164mm/Hg   Increase activity slowly   Complete by: As directed       Allergies as of 04/12/2021   No Known Allergies      Medication List     STOP taking these medications    amLODipine 10 MG tablet Commonly known as: NORVASC   aspirin 81 MG EC tablet Replaced by: aspirin 81  MG chewable tablet   carvedilol 25 MG tablet Commonly known as: COREG   cloNIDine 0.3 MG tablet Commonly known as: CATAPRES   hydrALAZINE 100 MG tablet Commonly known as: APRESOLINE       TAKE these medications    acetaminophen 500 MG tablet Commonly known as: TYLENOL Take 500 mg by mouth every 6 (six) hours as needed for mild pain or moderate pain.   amiodarone 200 MG tablet Commonly known as: PACERONE Take 1 tablet (200 mg total) by mouth 2 (two) times daily for 7 days.   amiodarone 200 MG tablet Commonly known as: Pacerone Take 1 tablet (200 mg total) by mouth daily. Start taking on: April 20, 2021   aspirin 81 MG chewable tablet Chew 4 tablets (324 mg total) by mouth daily. Start taking on: April 13, 2021 Replaces: aspirin 81 MG EC tablet   atorvastatin 80 MG tablet Commonly known as: LIPITOR Take 1 tablet (80 mg total) by mouth daily.   feeding supplement  (GLUCERNA SHAKE) Liqd Take 237 mLs by mouth 3 (three) times daily between meals.   furosemide 40 MG tablet Commonly known as: Lasix Take 1 tablet (40 mg total) by mouth every Monday, Wednesday, and Friday for 9 days.   insulin aspart 100 UNIT/ML injection Commonly known as: novoLOG Inject 0-24 Units into the skin every 6 (six) hours.   ipratropium-albuterol 0.5-2.5 (3) MG/3ML Soln Commonly known as: DUONEB Take 3 mLs by nebulization 2 (two) times daily.   multivitamin with minerals Tabs tablet Take 1 tablet by mouth daily. Start taking on: April 13, 2021   nitroGLYCERIN 0.4 MG SL tablet Commonly known as: NITROSTAT Place 1 tablet (0.4 mg total) under the tongue every 5 (five) minutes as needed for chest pain.   pantoprazole 40 MG tablet Commonly known as: PROTONIX Take 1 tablet (40 mg total) by mouth daily. Start taking on: April 13, 2021   VITAMIN B-1 PO Take 1 tablet by mouth daily.               Discharge Care Instructions  (From admission, onward)           Start     Ordered   04/12/21 0000  Discharge wound care:       Comments: Per general surgery On continuous wound vac at 164mm/Hg   04/12/21 1353           No Known Allergies  Follow-up Information     [facility] [place]. Go on 04/24/2021.   Specialty: Cardiothoracic Surgery Why: PA/LAT CXR (to be taken at [place] which is in the same building as Dr. Vivi Martens office) on 01/18 at 2:30 pm;Appointment time is at 3:00 pm Contact information: [place], [place]        Skeet Latch, MD. Go on 05/07/2021.   Specialty: Cardiology Why: Appointment time is at 9:15 am Contact information: [place] [address] [phone]                  The results of significant diagnostics from this hospitalization (including imaging, microbiology, ancillary and  laboratory) are listed below for reference.    Significant Diagnostic Studies: CT ABDOMEN PELVIS WO CONTRAST  Result Date: 04/02/2021 CLINICAL DATA:  Hernia, complicated. EXAM: CT ABDOMEN AND PELVIS WITHOUT CONTRAST TECHNIQUE: Multidetector CT imaging of the abdomen and pelvis was performed following the standard protocol without IV contrast. COMPARISON:  CT abdomen and pelvis dated  03/24/2021. FINDINGS: Lower chest: New bibasilar consolidations, likely atelectasis. Surgical changes suggestive of ascending aorta replacement. Aneurysmal dilatation of the descending thoracic aorta, similar in caliber and configuration to earlier CT of 03/24/2021, further evaluation limited by the lack of intravascular contrast on today's study. Hepatobiliary: No acute-appearing abnormality within the liver. Stable appearance of the RIGHT hepatic cyst which measures approximally 5 cm greatest dimension. Vicarious excretion of contrast noted within the otherwise normal-appearing gallbladder. No bile duct dilatation is seen. Pancreas: Unremarkable. No pancreatic ductal dilatation or surrounding inflammatory changes. Spleen: Normal in size without focal abnormality. Adrenals/Urinary Tract: Bilateral adrenal fullness, but otherwise unremarkable. Kidneys appear normal without stone or hydronephrosis. Bladder appears normal. Stomach/Bowel: Midline abdominal wall hernia, multicompartmental, containing separate segments of the transverse colon. There is now a dilatation of the ascending colon and cecum (proximal to the hernia site) with associated air-fluid levels, indicating mechanical obstruction at the hernia site. The descending colon is relatively decompressed indicating complete or high-grade partial obstruction. No associated small bowel dilatation at present. Vascular/Lymphatic: Abdominal aortic aneurysm, stable in the short-term interval, 4.6 cm in maximum diameter (previously 3.9 cm on CT of 06/2017). Further characterization  limited by the lack of intravascular contrast. Reproductive: Uterus and bilateral adnexa are unremarkable. Other: Small amount of free fluid in the pelvis. Large amount of free intraperitoneal air within the upper abdomen. Additional extraluminal air within the hernia sac. Findings are compatible with a bowel perforation, likely involving the transverse colon at or immediately adjacent to the hernia sac. Musculoskeletal: No acute-appearing osseous abnormality. Degenerative spondylosis of the thoracolumbar spine, mild in degree. Large amount of apparent soft tissue edema versus soft tissue hematoma is now seen along the LEFT lateral chest wall, incompletely imaged. IMPRESSION: 1. Large amount of free intraperitoneal air within the upper abdomen, with additional extraluminal air within the hernia sac. Findings are compatible with a bowel perforation, likely involving the transverse colon at or immediately adjacent to the hernia sac. 2. Midline abdominal wall hernia, multicompartmental, containing separate segments of the transverse colon. There is now a dilatation of the ascending colon and cecum (proximal to the hernia site) with associated air-fluid levels, indicating mechanical obstruction at the hernia site. The descending colon is relatively decompressed indicating complete or high-grade partial obstruction. 3. Abdominal aortic aneurysm, stable in the short-term interval, 4.6 cm in maximum diameter (previously 3.9 cm on CT of 06/2017). Further characterization limited by the lack of intravascular contrast on today's study. 4. New bibasilar consolidations, likely atelectasis. 5. Large amount of apparent soft tissue edema versus soft tissue hematoma is now seen along the LEFT lateral chest wall, incompletely imaged. Clinical correlation recommended. Critical Value/emergent results were called by telephone at the time of interpretation on 04/02/2021 at 4:40 pm to provider [facility] , who verbally acknowledged  these results. Electronically Signed   By: Franki Cabot M.D.   On: 04/02/2021 16:41   DG Chest 2 View  Result Date: 04/02/2021 CLINICAL DATA:  Effusions.  Distended abdomen. EXAM: CHEST - 2 VIEW COMPARISON:  Two-view chest x-ray 12/24/2 FINDINGS: Heart is enlarged. Right IJ sheath was removed. Left pleural effusion remains. Mild bibasilar atelectasis is present. No significant edema is present. No significant airspace consolidation IMPRESSION: Persistent left pleural effusion and bibasilar atelectasis. Electronically Signed   By: San Morelle M.D.   On: 04/02/2021 09:48   DG Abd 1 View  Result Date: 04/02/2021 CLINICAL DATA:  Pleural effusion, distended abdomen. EXAM: ABDOMEN - 1 VIEW COMPARISON:  Radiographs 03/31/2021, 03/30/2021 and  06/03/2017. CT 03/24/2021. FINDINGS: 0936 hours. Two supine views are submitted. Progressive increased gaseous distention of the right colon which could relate to colonic obstruction within the ventral hernia containing transverse colon on recent CT. There is gas throughout nondistended small bowel and distal colon. No supine evidence of free intraperitoneal air. Mild bibasilar atelectasis and a probable small right pleural effusion are noted. There are degenerative changes within the spine. IMPRESSION: Increased gaseous distention of the right colon which could be due to postoperative ileus or colonic obstruction within the known ventral hernia. Electronically Signed   By: Richardean Sale M.D.   On: 04/02/2021 09:53   DG Abd 1 View  Result Date: 03/30/2021 CLINICAL DATA:  Obstruction of bowel EXAM: ABDOMEN - 1 VIEW COMPARISON:  06/03/2017 FINDINGS: Gas throughout large and small bowel. Mild prominence of the right colon and hepatic flexure. No organomegaly or free air. IMPRESSION: Gas within nondistended small bowel and mildly prominent right colon. Electronically Signed   By: Rolm Baptise M.D.   On: 03/30/2021 12:10   CT Chest Wo Contrast  Result Date:  03/24/2021 CLINICAL DATA:  Aortic aneurysm. Chest pain. History of heart attack. Is tree of dissection. EXAM: CT CHEST WITHOUT CONTRAST TECHNIQUE: Multidetector CT imaging of the chest was performed following the standard protocol without IV contrast. COMPARISON:  CT scan of the chest October 07, 2017 FINDINGS: Cardiovascular: A thoracic aortic aneurysm is again identified. The ascending thoracic aorta measures 4 cm on series 2, image 80 today versus 3.5 cm in July of 2019. There is subtle high attenuation in the anterior aspect of the distal ascending thoracic aorta is seen on series 2, image 49 which could represent intramural hematoma. The aortic arch demonstrates a caliber of 5.6 cm as seen on series 2, image 39 versus 4.3 cm on the previous study. Mild calcified atherosclerosis is identified. The central pulmonary arteries are normal. The heart size is unchanged. Calcified atherosclerosis is seen in the left coronary arteries. Mediastinum/Nodes: There is a pericardial effusion measuring up to 1.5 cm with an attenuation of 40 Hounsfield units. This is a new finding. There is increased attenuation in the fat of the mediastinum, well seen on series 2, image 64 in the subcarinal region, series 2, image 55 in the precarinal region, and in the anterior mediastinum adjacent to the aortic arch. No adenopathy. The esophagus and thyroid are normal. Lungs/Pleura: Central airways are normal. No pneumothorax. No pulmonary nodules, masses, or focal infiltrates. Upper Abdomen: There is a ventral hernia containing fat and bowel with no evidence of bowel obstruction in the upper abdomen. No other interval changes or acute abnormalities. Musculoskeletal: No chest wall mass or suspicious bone lesions identified. IMPRESSION: 1. 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 The ascending thoracic aorta measures 4 cm today versus 3.5 cm previously. The arch measures 5.6 cm today  versus 4.3 cm previously. The previously identified dissection cannot be assessed on this study without contrast. However, high attenuation along the anterior aspect of the ascending thoracic aorta is concerning for intramural hematoma. The increased stranding and small amount of fluid in the mediastinum is highly concerning for a leaking aorta. The high attenuation fluid in the pericardium is concerning for hemopericardium. Recommend consultation with thoracic surgery. A CT angiogram of the chest could further evaluate if the patient is not taken directly to surgery. 2. Ventral hernia in the upper abdomen containing fat and bowel without evidence of bowel obstruction in  the upper abdomen. Findings were discussed directly with Dr. Melina Copa. Electronically Signed   By: Dorise Bullion III M.D.   On: 03/24/2021 13:34   DG Chest Port 1 View  Result Date: 04/09/2021 CLINICAL DATA:  61 year old female status post abdominal surgery on December 27th for incarcerated hernia. Recent surgery for thoracic aortic repair. EXAM: PORTABLE CHEST 1 VIEW COMPARISON:  Noncontrast CT Chest, Abdomen, and Pelvis 04/08/2021 and earlier. FINDINGS: Portable AP semi upright view at 0554 hours. Changes of CABG. Stable cardiac size and mediastinal contours. Improved bibasilar ventilation since 04/06/2021, larger lung volumes. No pneumothorax or pulmonary edema. Visualized tracheal air column is within normal limits. Stable right PICC line, right axillary surgical clips. Paucity of bowel gas in the upper abdomen. IMPRESSION: 1. Improved lung volumes and bibasilar ventilation since 04/06/2021. 2. Stable mediastinal contours status post thoracic aortic repair, CABG. 3. No new cardiopulmonary abnormality. Electronically Signed   By: Genevie Ann M.D.   On: 04/09/2021 08:21   DG CHEST PORT 1 VIEW  Result Date: 04/06/2021 CLINICAL DATA:  62 year old female with history of left-sided pleural effusion status post surgery. EXAM: PORTABLE CHEST 1 VIEW  COMPARISON:  Chest x-ray 04/05/2021. FINDINGS: Previously noted enteric tube has been removed. There is a right upper extremity PICC with tip terminating in the mid superior vena cava. Lung volumes are low. There are bibasilar opacities which may reflect areas of atelectasis and/or consolidation. Small left pleural effusion. No definite right pleural effusion. No pneumothorax. No evidence of pulmonary edema. Heart size is mildly enlarged. Upper mediastinal contours are remarkable for prominence of the thoracic aorta, similar to the prior study, compatible with known aortic aneurysm/dissection better demonstrated on recent chest CTA 03/24/2021. Status post median sternotomy for CABG. IMPRESSION: 1. Postoperative changes and support apparatus, as above. 2. Low lung volumes with bibasilar areas of atelectasis and/or consolidation and small left pleural effusion. 3. Known aortic aneurysm/dissection grossly similar in appearance to prior examinations, as above. Please see prior chest CT 03/24/2021 for full description. Electronically Signed   By: Vinnie Langton M.D.   On: 04/06/2021 11:01   DG CHEST PORT 1 VIEW  Result Date: 04/05/2021 CLINICAL DATA:  Pneumothorax EXAM: PORTABLE CHEST 1 VIEW COMPARISON:  04/03/2021 FINDINGS: Endotracheal tube is no longer present. Right PICC line and enteric tube again identified. Low lung volumes with bibasilar atelectasis. Similar cardiomediastinal contours. No pneumothorax. IMPRESSION: Lines and tubes as above. Low lung volumes with bibasilar atelectasis. Electronically Signed   By: Macy Mis M.D.   On: 04/05/2021 08:15   DG Chest Port 1 View  Result Date: 04/03/2021 CLINICAL DATA:  PICC line repositioned. EXAM: PORTABLE CHEST 1 VIEW COMPARISON:  Earlier today. FINDINGS: The right PICC has been retracted with its tip approximally 2.5 cm proximal to the superior cavoatrial junction. Endotracheal tube in satisfactory position. Nasogastric tube extending into the  stomach. Stable post CABG changes and right axillary surgical clips. Interval minimal left basilar atelectasis and stable minimal right basilar atelectasis. IMPRESSION: 1. Repositioned PICC tube with its tip 2.5 cm proximal to the superior cavoatrial junction. 2. Minimal bibasilar atelectasis. 3. Stable cardiomegaly. Electronically Signed   By: Claudie Revering M.D.   On: 04/03/2021 11:32   DG Chest Port 1 View  Result Date: 04/03/2021 CLINICAL DATA:  61 year old female with a history recent PICC, repair of thoracic aortic dissection EXAM: PORTABLE CHEST 1 VIEW COMPARISON:  04/02/2021 FINDINGS: Surgical changes of median sternotomy, for repair of acute dissection. Endotracheal tube terminates suitably above the carina.  Gastric tube projects over the mediastinum and terminates out of the field of view. Interval placement of right upper extremity PICC with the tip appearing to terminate in the upper right atrium. No pneumothorax. Low lung volumes with asymmetric elevation the right hemidiaphragm. No new confluent airspace disease or interlobular septal thickening. Crowding of the central vasculature given the low lung volumes. IMPRESSION: Interval placement of right upper extremity PICC, with the tip appearing to terminate in the upper right atrium. Unchanged endotracheal tube and gastric tube, with redemonstration of surgical changes of median sternotomy and repair of aortic dissection Electronically Signed   By: Corrie Mckusick D.O.   On: 04/03/2021 09:39   DG CHEST PORT 1 VIEW  Result Date: 04/02/2021 CLINICAL DATA:  NG tube placement and intubation EXAM: PORTABLE CHEST 1 VIEW COMPARISON:  04/02/2021 FINDINGS: Postoperative changes in the mediastinum. Interval placement of an endotracheal tube with tip measuring 4.5 cm above the carina. An enteric tube is present. Tip is off the field of view but below the left hemidiaphragm. Shallow inspiration. Lungs are clear. Mild cardiac enlargement. Dilated aortic arch  and descending aorta. Surgical clips in the right axilla. IMPRESSION: Appliances appear in satisfactory location. Cardiac enlargement. Dilated aorta. Lungs are clear. Electronically Signed   By: Lucienne Capers M.D.   On: 04/02/2021 22:01   DG CHEST PORT 1 VIEW  Result Date: 03/30/2021 CLINICAL DATA:  Pleural effusion EXAM: PORTABLE CHEST 1 VIEW COMPARISON:  03/29/2021 FINDINGS: Cardiomegaly. Prior CABG. Interval removal of mediastinal drain. Tortuous, prominent aorta is unchanged. Vascular congestion and bibasilar atelectasis. No effusions or pneumothorax. IMPRESSION: Postoperative changes.  Bibasilar atelectasis.  No pneumothorax. Cardiomegaly, vascular congestion. Electronically Signed   By: Rolm Baptise M.D.   On: 03/30/2021 06:36   DG CHEST PORT 1 VIEW  Result Date: 03/29/2021 CLINICAL DATA:  Pleural effusion. Chest tube. EXAM: PORTABLE CHEST 1 VIEW COMPARISON:  03/28/2021 FINDINGS: Right internal jugular venous access sheath remains in place. Mediastinal drains remain in place. Previous median sternotomy and CABG. Cardiomegaly and aortic tortuosity as seen previously. Mild bibasilar atelectasis. No pneumothorax. No measurable pleural effusion. IMPRESSION: Mild bibasilar atelectasis. No pneumothorax. Mild basilar atelectasis. No measurable pleural effusion. Electronically Signed   By: Nelson Chimes M.D.   On: 03/29/2021 08:15   DG CHEST PORT 1 VIEW  Result Date: 03/28/2021 CLINICAL DATA:  Chest tube, ascending aortic replacement. EXAM: PORTABLE CHEST 1 VIEW COMPARISON:  03/27/2021 FINDINGS: Postoperative changes. Mediastinal drain and right internal jugular vascular sheath remain in place, unchanged. Cardiomegaly. Prominence of the aorta again noted, stable. Vascular congestion with left base atelectasis. No effusions or pneumothorax. IMPRESSION: Left base atelectasis, slightly improved since prior study. No pneumothorax. Cardiomegaly, vascular congestion. Electronically Signed   By: Rolm Baptise  M.D.   On: 03/28/2021 08:09   DG CHEST PORT 1 VIEW  Result Date: 03/27/2021 CLINICAL DATA:  Post ascending aortic replacement, chest soreness, chest tube EXAM: PORTABLE CHEST 1 VIEW COMPARISON:  Portable exam 0509 hours compared to 03/26/2021 FINDINGS: Interval removal of endotracheal tube, nasogastric tube, and Swan-Ganz catheter. Mediastinal drains and RIGHT jugular catheter persist. RIGHT jugular catheter demonstrates a kink near the skin surface. Enlargement of cardiac silhouette post CABG. Widening of mediastinum likely related to a combination of aortic enlargement and postsurgical changes similar to previous exam. Persistent atelectasis LEFT lower lobe. No gross pleural effusion or pneumothorax. IMPRESSION: Stable postoperative changes. Electronically Signed   By: Lavonia Dana M.D.   On: 03/27/2021 08:41   DG Chest Port 1  View  Result Date: 03/26/2021 CLINICAL DATA:  Ascending aortic replacement, intubated, chest tube EXAM: PORTABLE CHEST 1 VIEW COMPARISON:  Chest radiograph 1 day prior FINDINGS: Endotracheal tube tip is approximately 5.1 cm from the carina. The right IJ Swan-Ganz catheter is in stable position likely in the proximal main pulmonary artery. A presumed mediastinal drain is seen terminating over the midline. A catheter projecting over the left upper quadrant may reflect the enteric catheter tip. The cardiomediastinal silhouette is grossly stable. Dense retrocardiac opacity and a small left pleural effusion are not significantly changed. Aeration of the left upper lung is maintained. The right lung is clear. There is no significant right effusion. There is no appreciable pneumothorax. The bones are stable. IMPRESSION: 1. Support devices as above. 2. Unchanged dense retrocardiac opacity and small left pleural effusion. Overall, no significant interval change in lung aeration. Electronically Signed   By: Valetta Mole M.D.   On: 03/26/2021 08:21   DG Chest Port 1 View  Result Date:  03/25/2021 CLINICAL DATA:  Status post ascending thoracic aortic aneurysm repair EXAM: PORTABLE CHEST 1 VIEW COMPARISON:  03/24/2021 FINDINGS: 0539 hours. A tracheal tube tip is approximately 4.3 cm above the base of the carina. Right IJ pulmonary catheter is in the pulmonary outflow tract. The NG tube passes into the stomach although the distal tip position is not included on the film. Midline mediastinal/pericardial drain evident. The cardio pericardial silhouette is enlarged. With prominence of the transverse aorta. Low lung volumes with some left basilar atelectasis and tiny left pleural effusion. Tiny left apical cap may be related to loculated pleural or extrapleural fluid. Telemetry leads overlie the chest. IMPRESSION: 1. Support apparatus as described. 2. Low lung volumes with left basilar atelectasis and tiny left pleural effusion. Electronically Signed   By: Misty Stanley M.D.   On: 03/25/2021 05:48   DG Chest Port 1 View  Result Date: 03/24/2021 CLINICAL DATA:  Chest pain. EXAM: PORTABLE CHEST 1 VIEW COMPARISON:  October 06, 2017 FINDINGS: The aorta appears more tortuous in the interval. This may be at least partially due to the portable technique. The heart, hila, mediastinum are otherwise unchanged. No pneumothorax. The lungs are clear. IMPRESSION: The aorta appears more pronounced and tortuous in the interval. This finding could be at least partially due to the portable technique. A PA and lateral chest x-ray may better evaluate. Alternatively, if there is concern for aortic pathology, a CT scan would better evaluate. Electronically Signed   By: Dorise Bullion III M.D.   On: 03/24/2021 11:56   DG Abd Portable 1V  Result Date: 03/31/2021 CLINICAL DATA:  Abdominal pain EXAM: PORTABLE ABDOMEN - 1 VIEW COMPARISON:  03/30/2021 FINDINGS: Stomach is not distended. There is mild dilation of small-bowel loops. There is increase in the amount of small bowel gas. Gas is present in colon. IMPRESSION:  Interval increase in amount of gas in small bowel loops may suggest ileus. Electronically Signed   By: Elmer Picker M.D.   On: 03/31/2021 09:10   ECHO INTRAOPERATIVE TEE (ANESTHESIA ONLY)  Result Date: 03/25/2021  *INTRAOPERATIVE TRANSESOPHAGEAL REPORT *  Patient Name:   TYIANA HILL [facility]  Date of Exam: 03/24/2021 Medical Rec #:  [id number]      Height:       67.0 in Accession #:    [id number]     Weight:       175.0 lb Date of Birth:  1960/10/05      BSA:  1.91 m Patient Age:    60 years       BP:           153/120 mmHg Patient Gender: F              HR:           58 bpm. Exam Location:  Anesthesiology Transesophogeal exam was perform intraoperatively during surgical procedure. Patient was closely monitored under general anesthesia during the entirety of examination. Indications:     Aortic Dissection Performing Phys: [id number] Thurmond Butts P ELLENDER Complications: No known complications during this procedure. POST-OP IMPRESSIONS _ Left Ventricle: The left ventricle is unchanged from pre-bypass. _ Right Ventricle: The right ventricle appears unchanged from pre-bypass. _ Aorta: There is no dissection present in the aortic root. A graft was placed in the ascending aorta for repair. _ Left Atrial Appendage: The left atrial appendage appears unchanged from pre-bypass. _ Aortic Valve: There is trace regurgitation. The jet is directed posteriorly. On short axis view there is a small coaptation defect between the left coronary cusp and the non coronary cusp. _ Mitral Valve: The mitral valve appears unchanged from pre-bypass. _ Tricuspid Valve: The tricuspid valve appears unchanged from pre-bypass. _ Pulmonic Valve: The pulmonic valve appears unchanged from pre-bypass. _ Interatrial Septum: The interatrial septum appears unchanged from pre-bypass. _ Pericardium: The pericardium appears unchanged from pre-bypass. PRE-OP FINDINGS  Left Ventricle: The left ventricle has hyperdynamic systolic function, with an ejection  fraction of >65%. The cavity size was normal. There is severely increased left ventricular wall thickness. There is severe concentric left ventricular hypertrophy. Right Ventricle: The right ventricle has normal systolic function. The cavity was normal. There is no increase in right ventricular wall thickness. Left Atrium: Left atrial size was normal in size. No left atrial/left atrial appendage thrombus was detected. Right Atrium: Right atrial size was normal in size. Interatrial Septum: No atrial level shunt detected by color flow Doppler. Pericardium: A small pericardial effusion is present. Mitral Valve: The mitral valve is normal in structure. Mitral valve regurgitation is mild by color flow Doppler. Tricuspid Valve: The tricuspid valve was normal in structure. Tricuspid valve regurgitation was not visualized by color flow Doppler. Aortic Valve: The aortic valve is normal in structure. Aortic valve regurgitation was not visualized by color flow Doppler. There is no stenosis of the aortic valve. Pulmonic Valve: The pulmonic valve was normal in structure. Pulmonic valve regurgitation is not visualized by color flow Doppler. Aorta: There is mild dilatation of the aortic root and of the ascending aorta. There is evidence of a dissection in the aortic root and ascending aorta. The dissection extends proximally to the sinus of valsalva. The descending aorta is dilated.  Adele Barthel MD Electronically signed by Adele Barthel MD Signature Date/Time: 03/25/2021/4:14:01 AM    Final    CT Angio Chest/Abd/Pel for Dissection W and/or W/WO  Result Date: 03/24/2021 CLINICAL DATA:  Patient with history of type B descending thoracic aortic aneurysm, now with concern for intramural hematoma on noncontrast chest CT performed earlier today. EXAM: CT ANGIOGRAPHY CHEST, ABDOMEN AND PELVIS TECHNIQUE: Non-contrast CT of the chest was initially obtained. Multidetector CT imaging through the chest, abdomen and pelvis was performed  using the standard protocol during bolus administration of intravenous contrast. Multiplanar reconstructed images and MIPs were obtained and reviewed to evaluate the vascular anatomy. CONTRAST:  70mL OMNIPAQUE IOHEXOL 350 MG/ML SOLN COMPARISON:  Noncontrast chest CT-earlier same day Chest CT-10/07/2017 FINDINGS: CTA CHEST FINDINGS Vascular Findings: Interval  development of a short-segment type A thoracic aortic dissection beginning at the aortic root and extending to the posterior aspect of the aortic arch, peripheral to the takeoff of the left subclavian artery. The branch vessels of the aortic arch appear to arise from the true lumen and are not affected by the short-segment dissection. Conventional configuration of the aortic arch. High-density material is seen about the aortic root and within the pericardium sac compatible with hemorrhage though a discrete area of active extravasation is not identified. Similar findings of known short-segment type B thoracic aortic dissection, though now with nonocclusive mural thrombus within the false lumen involving the proximal descending thoracic aorta (image 41, series 4). The thoracic aorta is aneurysmal with measurements as follows. Normal heart size. Although this examination was not tailored for the evaluation the pulmonary arteries, there are no discrete filling defects within the central pulmonary arterial tree to suggest central pulmonary embolism. Normal caliber of the main pulmonary artery. ------------------------------------------------------------- Thoracic aortic measurements: SINOTUBULAR JUNCTION: 35 mm as measured in greatest oblique short axis coronal dimension. PROXIMAL ASCENDING THORACIC AORTA: 40 mm as measured in greatest oblique short axis axial dimension at the level of the main pulmonary artery (axial image 49, series 4) and approximately 43 mm in greatest oblique short axis coronal diameter (coronal image 78, series 8) AORTIC ARCH: 47 mm as measured  in greatest oblique short axis sagittal dimension (sagittal image 130, series 9). PROXIMAL DESCENDING THORACIC AORTA: 35 mm as measured in greatest oblique short axis axial dimension at the level of the main pulmonary artery. DISTAL DESCENDING THORACIC AORTA: 44 mm as measured in greatest oblique short axis axial dimension at the level of the diaphragmatic hiatus (axial image 91, series 4). Review of the MIP images confirms the above findings. ------------------------------------------------------------- Non-Vascular Findings: Mediastinum/Lymph Nodes: No bulky mediastinal, hilar or axillary lymphadenopathy. As above, high-density material is seen about the aortic root and pericardium worrisome for hemorrhage though discrete area active extravasation is not identified. Lungs/Pleura: Subsegmental atelectasis adjacent to the aneurysmal descending thoracic aorta. Minimal dependent subpleural ground-glass atelectasis. No discrete focal airspace opacities. No pleural effusion or pneumothorax. The central pulmonary airways appear widely patent. No discrete pulmonary nodules. Musculoskeletal: No acute or aggressive osseous abnormalities. Regional soft tissues appear normal. The thyroid gland is atrophic but otherwise normal in appearance. _________________________________________________________ _________________________________________________________ CTA ABDOMEN AND PELVIS FINDINGS VASCULAR Aorta: Interval increase in size of infrarenal abdominal aortic aneurysm now measuring 4.6 cm in maximal diameter (image 134, series 4, previously, 3.8 cm (when compared to the 06/2017 examination. The aneurysm is estimated to arise approximately 1.5 cm caudal to the take-off of the most inferior left renal artery and extend to approximately 1.3 cm above the aortic bifurcation. There is a large amount of crescentic mural thrombus within the dominant component of the aneurysm, not definitely resulting in hemodynamically significant  stenosis. No evidence of abdominal aortic dissection or perivascular stranding. Celiac: There is a minimal amount of mixed calcified and noncalcified atherosclerotic plaque involving the origin the celiac artery, not resulting in a hemodynamically significant stenosis. Conventional branching pattern. SMA: There is a moderate amount of eccentric mixed calcified and noncalcified atherosclerotic plaque involving the origin and proximal aspects of the SMA, not resulting in a hemodynamically significant stenosis. The distal tributaries of the SMA appear widely patent without discrete lumen filling defect to suggest distal embolism. Renals: Solitary bilaterally. There is a minimal amount of calcified atherosclerotic plaque involving the origin of the bilateral renal arteries, not resulting in hemodynamically significant  stenosis. No vessel irregularity to suggest FMD. IMA: Remains patent though its origin is surrounded by crescentic mural thrombus at the level of the abdominal aortic aneurysm. Inflow: There is a moderate amount of mixed calcified and noncalcified atherosclerotic plaque involving the bilateral common iliac arteries, not resulting in hemodynamically significant stenosis. The bilateral internal iliac arteries are mildly disease though patent and of normal caliber. The bilateral external iliac arteries are markedly tortuous though of normal caliber and widely patent without hemodynamically significant stenosis. The bilateral common and imaged portions of the bilateral deep and superficial femoral arteries are without a definitive hemodynamically significant narrowing. Veins: The IVC and pelvic venous systems appear patent on this arterial phase examination. Review of the MIP images confirms the above findings. _________________________________________________________ NON-VASCULAR Hepatobiliary: Normal hepatic contour. Note is made of an approximately 5.0 cm hypoattenuating cyst within the caudate (represent  image 104, series 4). No discrete hyperenhancing hepatic lesions. Normal appearance of the gallbladder given degree distention. No radiopaque gallstones. No intra or extrahepatic biliary ductal dilatation. No ascites. Pancreas: Normal appearance of the pancreas. Spleen: Normal appearance of the spleen. Adrenals/Urinary Tract: There is symmetric enhancement of the bilateral kidneys. No evidence of nephrolithiasis on this postcontrast examination. No urine obstruction or perinephric stranding. There is mild thickening the bilateral adrenal glands, left greater than right, without discrete nodule. Diffuse thickening of the urinary bladder wall, potentially accentuated due to underdistention. Stomach/Bowel: There are two portions of the transverse colon contained within a wide lack midline ventral wall abdominal hernia, not resulting in enteric obstruction though small amount of fluid is seen within the caudal aspect of the hernia. Moderate colonic stool burden without evidence of enteric obstruction. Normal appearance of the terminal ileum and the retrocecal appendix. No significant hiatal hernia. No pneumoperitoneum, pneumatosis or portal venous gas. Lymphatic: No bulky retroperitoneal, mesenteric, pelvic or inguinal lymphadenopathy. Reproductive: Normal appearance of the pelvic organs for age. No discrete adnexal lesion. No free fluid the pelvic cul-de-sac. Other: Wide neck midline ventral wall hernia contains two discrete portions of the transverse colon. The hernia is estimated to measure at least 15.4 x 11.9 x 7.4 cm (sagittal image 132, series 9; axial image 125, series 4), while the neck of the hernia is estimated to measure at least 5.9 x 5.6 cm (axial image 126, series 4; sagittal image 130, series 9). Musculoskeletal: No acute or aggressive osseous abnormalities. Moderate DDD of L5-S1 with disc space height loss, endplate irregularity and sclerosis. Review of the MIP images confirms the above findings.  IMPRESSION: Chest CTA impression: 1. The examination is positive for an acute short-segment ascending thoracic aortic dissection with flow seen within both the true and false lumens. High-density fluid is seen about the aortic root and pericardial sac compatible with hemorrhage though a discrete area of active extravasation is not identified. The type A dissection terminates at the level of the posterior aspect of the aortic arch without extension or involvement of the branch vessels of the aortic arch, all of which appear to arise from the true lumen. 2. 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 Abdomen and pelvic CTA impression: 1. No acute findings within the abdomen or pelvis. 2. Interval increase in size of infrarenal abdominal aortic aneurysm, now measuring 4.6 cm in maximum diameter, previously, 3.9 cm (when compared to the 06/2017 examination). There is a large amount of crescentic mural thrombus within the dominant component of the aneurysm, not resulting in a hemodynamically significant stenosis. No evidence of  abdominal aortic dissection or periaortic stranding. Aortic aneurysm NOS (ICD10-I71.9). 3. Wide neck midline ventral wall hernia contains two separate portions of the transverse colon, not resulting in enteric obstruction. Critical Value/emergent results were called by telephone at the time of interpretation on 03/24/2021 at 2:36 pm to provider [facility] , who verbally acknowledged these results. Electronically Signed   By: Sandi Mariscal M.D.   On: 03/24/2021 15:06   VAS [place] UPPER EXTREMITY VENOUS DUPLEX  Result Date: 04/07/2021 UPPER VENOUS STUDY  Patient Name:  NATHA GUIN [facility]  Date of Exam:   04/07/2021 Medical Rec #: [id number]      Accession #:    [id number] Date of Birth: [birth year]      Patient Gender: F Patient Age:   80 years Exam Location:  [facility] Procedure:      VAS [place] UPPER EXTREMITY VENOUS DUPLEX Referring Phys: Patrick Jupiter GOLD  --------------------------------------------------------------------------------  Indications: Swelling Comparison Study: swelling Performing Technologist: Archie Patten RVS  Examination Guidelines: A complete evaluation includes B-mode imaging, spectral Doppler, color Doppler, and power Doppler as needed of all accessible portions of each vessel. Bilateral testing is considered an integral part of a complete examination. Limited examinations for reoccurring indications may be performed as noted.  Right Findings: +----------+------------+---------+-----------+----------+-------+  RIGHT      Compressible Phasicity Spontaneous Properties Summary  +----------+------------+---------+-----------+----------+-------+  Subclavian                 Yes        Yes                         +----------+------------+---------+-----------+----------+-------+  Left Findings: +----------+------------+---------+-----------+----------+-------+  LEFT       Compressible Phasicity Spontaneous Properties Summary  +----------+------------+---------+-----------+----------+-------+  IJV            Full        Yes        Yes                         +----------+------------+---------+-----------+----------+-------+  Subclavian     Full        Yes        Yes                         +----------+------------+---------+-----------+----------+-------+  Axillary       Full        Yes        Yes                         +----------+------------+---------+-----------+----------+-------+  Brachial       Full        Yes        Yes                         +----------+------------+---------+-----------+----------+-------+  Radial         Full                                               +----------+------------+---------+-----------+----------+-------+  Ulnar          Full                                               +----------+------------+---------+-----------+----------+-------+  Cephalic       Full                                                +----------+------------+---------+-----------+----------+-------+  Basilic        Full                                               +----------+------------+---------+-----------+----------+-------+  Summary:  Right: No evidence of thrombosis in the subclavian.  Left: No evidence of deep vein thrombosis in the upper extremity. No evidence of superficial vein thrombosis in the upper extremity.  *See table(s) above for measurements and observations.  Diagnosing physician: Monica Martinez MD Electronically signed by Monica Martinez MD on 04/07/2021 at 11:35:37 AM.    Final    [place] EKG SITE RITE  Result Date: 04/02/2021 If Site Rite image not attached, placement could not be confirmed due to current cardiac rhythm.  CT CHEST ABDOMEN PELVIS WO CONTRAST  Result Date: 04/08/2021 CLINICAL DATA:  Sepsis, recent exploratory laparotomy 04/02/2021 EXAM: CT CHEST, ABDOMEN AND PELVIS WITHOUT CONTRAST TECHNIQUE: Multidetector CT imaging of the chest, abdomen and pelvis was performed following the standard protocol without IV contrast. Examination was performed without intravenous contrast due to renal insufficiency, and evaluation of the vascular structures and solid viscera is limited. Techniques to minimize radiation exposure, such as automated exposure control, adjustment of mA and/or kV according to patient size, or iterative reconstruction, are utilized, when appropriate, to reduce radiation dose to as low as reasonably achievable. COMPARISON:  03/24/2021, 04/02/2021 FINDINGS: CT CHEST FINDINGS Cardiovascular: Interval postsurgical changes from median sternotomy and ascending thoracic aortic dissection repair. Evaluation is extremely limited without intravenous contrast. 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 Again, evaluation of the vascular lumen is limited without IV contrast. There is evidence of interposition grafts  placed between the bilateral common carotid arteries and right subclavian artery. A right-sided PICC extends to the superior vena cava. The heart is unremarkable without pericardial effusion. Mediastinum/Nodes: Postsurgical changes are seen within the anterior mediastinum related to recent median sternotomy. Small amount of fluid within the anterior mediastinum deep to the sternum measures up to 1.9 x 2.8 cm, likely postoperative hematoma. Thyroid, trachea, and esophagus are grossly unremarkable. No pathologic adenopathy. Lungs/Pleura: Trace bilateral pleural effusions with dependent compressive atelectasis. Airspace disease or pneumothorax. The central airways are patent. Musculoskeletal: No acute displaced fractures. No destructive bony lesions. There is extensive edema throughout the left breast and left lateral chest wall of uncertain etiology. Reconstructed images demonstrate no additional findings. CT ABDOMEN PELVIS FINDINGS Hepatobiliary: High attenuation material within the gallbladder compatible with sludge. Stable caudate cyst within the liver. No intrahepatic duct dilation. Subcapsular hypodensity along the ventral left lobe liver image 56/3 measuring 3.1 x 1.1 cm likely reflects a small amount of subcapsular fluid. Pancreas: Unremarkable unenhanced appearance. Spleen: Unremarkable unenhanced appearance. Adrenals/Urinary Tract: No urinary tract calculi or obstructive uropathy. Adrenals are stable. Bladder is decompressed with a Foley catheter. Stomach/Bowel: Distended loops of proximal small bowel are identified, measuring up to 4 cm in diameter. There are multiple proximal small-bowel gas fluid levels. Mid to distal jejunum and ileum are decompressed,  with diverting ileostomy in the right mid abdomen. Exact transition point between dilated and nondilated bowel is not well visualized. Postsurgical changes are seen from proximal colonic resection. No evidence of bowel wall thickening. Vascular/Lymphatic:  Stable dilation of the visualized thoracoabdominal aorta, with diffuse atherosclerosis. The chronic dissection seen previously is not well visualized given the lack of intravenous contrast. No pathologic adenopathy. Reproductive: Uterus and bilateral adnexa are unremarkable. Other: There are 2 surgical drains within the lower pelvis and right hemiabdomen. Small amount of free fluid is seen scattered throughout the abdomen and pelvis. No obvious fluid collection or abscess on this limited unenhanced exam. No evidence of pneumoperitoneum. Postsurgical changes are seen from midline laparotomy, with surgical packing in place at the incision site. There is a small right periumbilical fat containing ventral hernia, though the large ventral hernia seen previously has been completely repaired in the interim. Extensive subcutaneous edema within the bilateral flanks. Musculoskeletal: No acute or destructive bony lesions. Reconstructed images demonstrate no additional findings. IMPRESSION: CT chest: 1. Interval postsurgical changes from ascending thoracic aortic dissection repair. Evaluation is extremely limited given the lack of intravenous contrast. 2. 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 Again, evaluation is extremely limited given the lack of intravenous contrast. 3. Trace bilateral pleural effusions with dependent bilateral lower lobe atelectasis. 4. Marked edema within the left lateral chest wall and left breast. 5.  Aortic Atherosclerosis (ICD10-I70.0). CT abdomen/pelvis: 1. Postsurgical changes from midline laparotomy, proximal colonic resection, and diverting ileostomy. 2. Proximal small bowel dilation with gas fluid levels, compatible with small-bowel obstruction. Decompression of the mid to distal jejunum and ileum, with no clear transition point identified on this limited exam. 3. Trace free fluid within the abdomen and pelvis, with no obvious abscess on this study limited by the lack of  IV contrast. 4. High attenuation material within the gallbladder consistent with gallbladder sludge. No evidence of acute cholecystitis. 5. Interval repair of a large midline ventral hernia. Small residual right periumbilical fat containing ventral hernia identified. 6. Dependent body wall edema. 7.  Aortic Atherosclerosis (ICD10-I70.0). Electronically Signed   By: Randa Ngo M.D.   On: 04/08/2021 16:21    Microbiology: No results found for this or any previous visit (from the past 240 hour(s)).   Labs: Basic Metabolic Panel: Recent Labs  Lab 04/06/21 0514 04/07/21 0307 04/08/21 0546 04/09/21 0500 04/10/21 0402 04/11/21 0450 04/12/21 0445  NA 144 143 142 140 139 139 139  K 3.7 3.8 4.2 4.5 4.9 4.7 4.5  CL 109 104 102 100 98 98 100  CO2 $Re'23 26 30 31 31 'pnT$ 32 30  GLUCOSE 191* 277* 130* 155* 162* 142* 109*  BUN 63* 56* 49* 49* 46* 42* 41*  CREATININE 2.09* 1.81* 1.83* 1.92* 2.17* 2.34* 2.25*  CALCIUM 8.6* 8.3* 8.4* 7.9* 8.1* 8.2* 8.1*  MG 2.2 1.8 1.6* 2.0 1.8 2.0  --   PHOS 2.4* 2.5 3.7 4.3 5.1*  --   --    Liver Function Tests: Recent Labs  Lab 04/08/21 0546 04/10/21 0402  AST 37 27  ALT 38 33  ALKPHOS 131* 100  BILITOT 2.4* 1.8*  PROT 6.1* 5.8*  ALBUMIN 1.9* 1.8*   No results for input(s): LIPASE, AMYLASE in the last 168 hours. No results for input(s): AMMONIA in the last 168 hours. CBC: Recent Labs  Lab 04/08/21 0546 04/09/21 0500 04/10/21 0402 04/11/21 0450 04/12/21 0445  WBC 23.1* 17.8* 17.0* 17.0* 15.7*  NEUTROABS  --  13.7* 13.3*  --   --  HGB 8.1* 6.6* 7.0* 7.1* 6.8*  HCT 24.5* 19.7* 21.2* 21.6* 21.5*  MCV 92.8 94.7 94.6 96.9 98.2  PLT 304 284 283 367 410*   Cardiac Enzymes: No results for input(s): CKTOTAL, CKMB, CKMBINDEX, TROPONINI in the last 168 hours. BNP: BNP (last 3 results) Recent Labs    03/28/21 0358 03/29/21 0214 04/07/21 0851  BNP 898.7* 954.4* 1,764.1*    ProBNP (last 3 results) No results for input(s): PROBNP in the last 8760  hours.  CBG: Recent Labs  Lab 04/11/21 1119 04/11/21 1629 04/11/21 2145 04/12/21 0542 04/12/21 1103  GLUCAP 197* 152* 112* 99 123*       Signed:  Florencia Reasons MD, PhD, FACP  [facility] 04/12/2021, 2:27 PM

## 2021-04-12 NOTE — Consult Note (Signed)
[place] Nurse wound follow up Patient receiving care in [facility] 4E11 Wound type: Surgical midline abdominal  Measurement: 22 cm x 7 cm x 4 cm Wound bed: Pink /red granulation tissue with some grey tissue and yellow fat globules Drainage (amount, consistency, odor) sanguinous on dressing Periwound: intact Dressing procedure/placement/frequency: Removed w/d dressing, placed one piece of black foam in the wound, covered with drape, immediate suction obtained at 125 mmHg.  1 dressing kit left at bedside for Monday.  [place] Nurse ostomy follow up Stoma type/location: RLQ ileostomy Stomal assessment/size: 1 1/4" pink round budded Peristomal assessment: sutures intact Treatment options for stomal/peristomal skin: barrier ring Output: mushy dark brown Ostomy pouching: 2pc. 2 3/4"  Education provided:  Explained role of ostomy nurse and creation of stoma  Explained stoma characteristics (budded, flush, color, texture, care) Demonstrated pouch change (cutting new skin barrier, measuring stoma, cleaning peristomal skin and stoma, use of barrier ring) patient was able to close pouch, cut skin barrier to size and size barrier ring.  Education on emptying when 1/3 to 1/2 full and how to empty Discussed bathing, diet, gas, constipation, diarrhea, dehydration  Discussed food blockage   Answered patient/family questions:   Handout given "Eating with an Ostomy" ([employer] [place].) www.ostomy.org (2022)   "What to do for a blocklage."  I recommend [facility] outpatient ostomy clinic as an outpatient resource.  IF MD agrees this would be beneficial, please fax referral, or enter electronically in Epic the referral. (Fax- [phone])  Enrolled patient in [place] Start Discharge program: Yes  WOC to follow M/W/F for vac changes and ostomy education and teaching. Please re-consult if needed.   Cathlean Marseilles Tamala Julian, MSN, RN, [place], [place]le Pearl, [facility] Wound Treatment Associate Pager [phone]

## 2021-04-12 NOTE — Progress Notes (Addendum)
Inpatient Rehab Admissions Coordinator:   I do not have a bed for this Pt. On CIR today. I will continue to follow for potential admission pending medical readiness.  Clemens Catholic, [place], [place] Admissions Coordinator  [phone] ([place]) [phone] (office)

## 2021-04-12 NOTE — Progress Notes (Signed)
PROGRESS NOTE    Dorothy Harris  CWC:[id number] DOB: 17-Sep-1960 DOA: 03/24/2021 PCP: Pcp, No    Chief Complaint  Patient presents with   Chest Pain    Brief Narrative:   Presented to [facility] 12/18 w/ cc: chest pain and SOB. 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 Started on esmolol, seen by CVTS; went to OR where she underwent repair and replacement of ascending aorta and replacement of aortic arch w/ Hemashield graft. Returned to surgical ICU post op on 12/19  She subsequently developed abdominal pain needing to go back to operating room on 12/27.  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  Extended right hemicolectomy and ileostomy.  She eventually transferred to floor and TRH taking over starting 04/06/21 1/1.  Patient has significant short of breath and wheezing, elevated BNP, started on IV Lasix.  Most recent TEE showed ejection fraction more than 65%.   Subjective:  Hgb dropped to 6.8 RN reports some bleeding from heparin injection site She has a wound vac placed to her abdomen Currently She is laying in bed, denies acute complaints No lower extremity edema No fever tolerating oral intake  Assessment & Plan:   Principal Problem:   Status post surgery Active Problems:   Acute on chronic diastolic CHF (congestive heart failure) (HCC)   S/P ascending aortic replacement   Encounter for weaning from ventilator ([place])   Encounter for postanesthesia care   Peritonitis ([place])   Endotracheally intubated   Acute renal failure superimposed on stage 3a chronic kidney disease ([place])   Malnutrition of moderate degree   Acute hypoxemic respiratory failure (HCC)   Type A aortic dissection.   she underwent repair and replacement of ascending aorta and replacement of aortic arch w/ Hemashield graft on 12/19 Management per vascular surgery  Peritonitis secondary to incarcerated colon and  perforated cecal volvulus and ischemic colon: Status post extended right 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 Finished abx treatment Off tpn Colonstomy is functioning Wound vac placed  Plan per general surgery  Blood loss anemia S/p mulitple prbc ,cryo, ffp plt transfusion  Hgb dropped to 6.8 on 1/6, will give another prbc, some mild injection site bleeding, no other overt sign of bleeding  Monitor sign of bleeding, monitor cbc  Afib/RVR on 12/31, paroxysmal  Intraoperative echocardiogram 12/18 with ejection fraction more than 65%. Off amiodarone drip, now on oral amiodarone, not a candidate for anticoagulation currently , need to be address after being discharged  Currently sinus rhythm  Acute on chronic diastolic chf -acute hypoxic respiratory failure Developed sob with hypxia on 1/1, likely due to volume overload from blood product transfusion and tpn, she received iv lasix Improved Lasix held due to elevation of cr Wean oxygen Monitor volume status  May need lasix prn   AKI on CKDIIIa Lasix held since 1/5 due to elevated Cr start to trend up,  Cr improving, Renal dosing meds  HTN, bp stable  Physical deconditioning Patient report still walks prior to coming to the hospital Out of bed Wean oxygen Working on CIR placement   Nutritional Assessment:  The patients BMI is: Body mass index is 37.9 kg/m.[place] Kitchen  Seen by dietician.  I agree with the assessment and plan as outlined below:  Nutrition Status: Nutrition Problem: Moderate Malnutrition Etiology: acute illness (aortic disection repair, bowel perforation) Signs/Symptoms: mild muscle depletion, energy intake < or equal to 50% for > or equal to 5 days Interventions: TPN  .  Unresulted Labs (From admission, onward)     Start     Ordered   04/11/21 0500  CBC  Daily,   R     Question:  Specimen collection method  Answer:  Unit=Unit collect   04/10/21 2001   04/11/21 9191  Basic metabolic panel  Daily,   R      Question:  Specimen collection method  Answer:  Unit=Unit collect   04/10/21 2001              DVT prophylaxis: heparin injection 5,000 Units Start: 04/10/21 0600 SCDs Start: 03/27/21 0857   Code Status: full Family Communication: patient  Disposition:   Status is: Inpatient  Dispo: The patient is from: home              Anticipated d/c is to: CIR              Anticipated d/c date is: medically stable to d/c to CIR, per CIR , would like improvement of wbc and hgb before admitting to cir                Consultants:  Thoracic surgery General surgery Critical care Wound care  Procedures:  As above  Antimicrobials:    Anti-infectives (From admission, onward)    Start     Dose/Rate Route Frequency Ordered Stop   04/08/21 1800  vancomycin (VANCOREADY) IVPB 1500 mg/300 mL  Status:  Discontinued        1,500 mg 150 mL/hr over 120 Minutes Intravenous Every 48 hours 04/08/21 0751 04/09/21 1454   04/06/21 2200  fluconazole (DIFLUCAN) tablet 200 mg  Status:  Discontinued        200 mg Oral Daily at bedtime 04/06/21 1232 04/09/21 1454   04/04/21 1800  vancomycin (VANCOREADY) IVPB 1250 mg/250 mL  Status:  Discontinued        1,250 mg 166.7 mL/hr over 90 Minutes Intravenous Every 48 hours 04/03/21 1501 04/08/21 0751   04/03/21 0600  piperacillin-tazobactam (ZOSYN) IVPB 3.375 g        3.375 g 12.5 mL/hr over 240 Minutes Intravenous On call to O.R. 04/02/21 1603 04/03/21 0916   04/03/21 0200  piperacillin-tazobactam (ZOSYN) IVPB 3.375 g  Status:  Discontinued        3.375 g 12.5 mL/hr over 240 Minutes Intravenous Every 8 hours 04/02/21 2143 04/09/21 1454   04/02/21 2245  fluconazole (DIFLUCAN) IVPB 200 mg  Status:  Discontinued        200 mg 100 mL/hr over 60 Minutes Intravenous Every 24 hours 04/02/21 2147 04/06/21 1231   04/02/21 2245  vancomycin (VANCOREADY) IVPB 2000 mg/400 mL        2,000 mg 200 mL/hr over 120 Minutes Intravenous  Once 04/02/21 2158 04/03/21 0142    04/02/21 2158  vancomycin variable dose per unstable renal function (pharmacist dosing)  Status:  Discontinued         Does not apply See admin instructions 04/02/21 2159 04/03/21 1501   03/25/21 1200  vancomycin (VANCOCIN) IVPB 1000 mg/200 mL premix        1,000 mg 200 mL/hr over 60 Minutes Intravenous  Once 03/25/21 0512 03/25/21 1206   03/25/21 0600  ceFAZolin (ANCEF) IVPB 2g/100 mL premix        2 g 200 mL/hr over 30 Minutes Intravenous Every 8 hours 03/25/21 0512 03/26/21 2217   03/24/21 1800  ceFAZolin (ANCEF) IVPB 2g/100 mL premix        2 g 200 mL/hr over 30  Minutes Intravenous To Surgery 03/24/21 1738 03/25/21 0308   03/24/21 1800  ceFAZolin (ANCEF) IVPB 2g/100 mL premix  Status:  Discontinued        2 g 200 mL/hr over 30 Minutes Intravenous To Surgery 03/24/21 1734 03/25/21 0512   03/24/21 1800  vancomycin (VANCOREADY) IVPB 1250 mg/250 mL        1,250 mg 166.7 mL/hr over 90 Minutes Intravenous To Surgery 03/24/21 1738 03/24/21 2114          Objective: Vitals:   04/12/21 0859 04/12/21 1043 04/12/21 1121 04/12/21 1136  BP: 125/67 134/71 115/65 121/66  Pulse: 62 68 60   Resp: $Remo'14 17 15   'tITkk$ Temp: 98.5 F (36.9 C) 98.4 F (36.9 C) 98.3 F (36.8 C) 98.2 F (36.8 C)  TempSrc: Oral Oral Oral Oral  SpO2: 97% 100% 100%   Weight:      Height:        Intake/Output Summary (Last 24 hours) at 04/12/2021 1259 Last data filed at 04/12/2021 1121 Gross per 24 hour  Intake 250 ml  Output 2250 ml  Net -2000 ml   Filed Weights   04/08/21 0500 04/09/21 0500 04/11/21 0500  Weight: 105 kg 105 kg 109.8 kg    Examination:  General exam: alert, awake, communicative,calm, NAD Respiratory system: diminished at basis, no wheezing, no rales, no rhonchi. Respiratory effort normal. Cardiovascular system:  RRR, + murmur best heard at right upper sternal border   Gastrointestinal system: + colostomy with formed and liquid stool ,  Normal bowel sounds heard. Central nervous system: Alert  and oriented. No focal neurological deficits. Extremities:  no edema Skin: No rashes, lesions or ulcers Psychiatry: Judgement and insight appear normal. Mood & affect appropriate.     Data Reviewed: I have personally reviewed following labs and imaging studies  CBC: Recent Labs  Lab 04/08/21 0546 04/09/21 0500 04/10/21 0402 04/11/21 0450 04/12/21 0445  WBC 23.1* 17.8* 17.0* 17.0* 15.7*  NEUTROABS  --  13.7* 13.3*  --   --   HGB 8.1* 6.6* 7.0* 7.1* 6.8*  HCT 24.5* 19.7* 21.2* 21.6* 21.5*  MCV 92.8 94.7 94.6 96.9 98.2  PLT 304 284 283 367 410*    Basic Metabolic Panel: Recent Labs  Lab 04/06/21 0514 04/07/21 0307 04/08/21 0546 04/09/21 0500 04/10/21 0402 04/11/21 0450 04/12/21 0445  NA 144 143 142 140 139 139 139  K 3.7 3.8 4.2 4.5 4.9 4.7 4.5  CL 109 104 102 100 98 98 100  CO2 $Re'23 26 30 31 31 'rUJ$ 32 30  GLUCOSE 191* 277* 130* 155* 162* 142* 109*  BUN 63* 56* 49* 49* 46* 42* 41*  CREATININE 2.09* 1.81* 1.83* 1.92* 2.17* 2.34* 2.25*  CALCIUM 8.6* 8.3* 8.4* 7.9* 8.1* 8.2* 8.1*  MG 2.2 1.8 1.6* 2.0 1.8 2.0  --   PHOS 2.4* 2.5 3.7 4.3 5.1*  --   --     GFR: Estimated Creatinine Clearance: 34 mL/min (A) (by C-G formula based on SCr of 2.25 mg/dL (H)).  Liver Function Tests: Recent Labs  Lab 04/08/21 0546 04/10/21 0402  AST 37 27  ALT 38 33  ALKPHOS 131* 100  BILITOT 2.4* 1.8*  PROT 6.1* 5.8*  ALBUMIN 1.9* 1.8*    CBG: Recent Labs  Lab 04/11/21 1119 04/11/21 1629 04/11/21 2145 04/12/21 0542 04/12/21 1103  GLUCAP 197* 152* 112* 99 123*     No results found for this or any previous visit (from the past 240 hour(s)).  Radiology Studies: No results found.      Scheduled Meds:  acetaminophen  1,000 mg Oral Q6H   amiodarone  200 mg Oral BID   aspirin  324 mg Oral Daily   chlorhexidine  15 mL Mouth Rinse BID   Chlorhexidine Gluconate Cloth  6 each Topical Daily   feeding supplement (GLUCERNA SHAKE)  237 mL Oral TID BM   heparin  5,000  Units Subcutaneous Q8H   insulin aspart  0-24 Units Subcutaneous Q6H   ipratropium-albuterol  3 mL Nebulization BID   mouth rinse  15 mL Mouth Rinse q12n4p   metoprolol tartrate  12.5 mg Oral BID   multivitamin with minerals  1 tablet Oral Daily   pantoprazole  40 mg Oral Daily   sodium chloride flush  10-40 mL Intracatheter Q12H   Continuous Infusions:   LOS: 19 days   Time spent: 53mins Greater than 50% of this time was spent in counseling, explanation of diagnosis, planning of further management, and coordination of care.   Voice Recognition Viviann Spare dictation system was used to create this note, attempts have been made to correct errors. Please contact the author with questions and/or clarifications.   Florencia Reasons, MD PhD FACP [facility]  Available via Epic secure chat 7am-7pm for nonurgent issues Please page for urgent issues To page the attending provider between 7A-7P or the covering provider during after hours 7P-7A, please log into the web site [email or web address] and access using universal [place] password for that web site. If you do not have the password, please call the hospital operator.    04/12/2021, 12:59 PM

## 2021-04-12 NOTE — Progress Notes (Signed)
10 Days Post-Op   Subjective/Chief Complaint: No new complaints.  Doing well today.   Objective: Vital signs in last 24 hours: Temp:  [97.7 F (36.5 C)-98.6 F (37 C)] 98.2 F (36.8 C) (01/06 0804) Pulse Rate:  [64-74] 64 (01/06 0804) Resp:  [12-20] 12 (01/06 0804) BP: (110-134)/(63-74) 134/68 (01/06 0804) SpO2:  [97 %-100 %] 99 % (01/06 0804) Last BM Date: 04/11/21  Intake/Output from previous day: 01/05 0701 - 01/06 0700 In: 240 [P.O.:240] Out: 1450 [Urine:700; Drains:50; Stool:700] Intake/Output this shift: No intake/output data recorded.  Exam: Abd: soft, appropriately tender, no further bleeding, wound overall clean and packed. Both drains removed and sites covered.  Ileostomy working well, stoma is pink  Lab Results:  Recent Labs    04/11/21 0450 04/12/21 0445  WBC 17.0* 15.7*  HGB 7.1* 6.8*  HCT 21.6* 21.5*  PLT 367 410*   BMET Recent Labs    04/11/21 0450 04/12/21 0445  NA 139 139  K 4.7 4.5  CL 98 100  CO2 32 30  GLUCOSE 142* 109*  BUN 42* 41*  CREATININE 2.34* 2.25*  CALCIUM 8.2* 8.1*   PT/INR No results for input(s): LABPROT, INR in the last 72 hours. ABG No results for input(s): PHART, HCO3 in the last 72 hours.  Invalid input(s): PCO2, PO2   Studies/Results: No results found.  Anti-infectives: Anti-infectives (From admission, onward)    Start     Dose/Rate Route Frequency Ordered Stop   04/08/21 1800  vancomycin (VANCOREADY) IVPB 1500 mg/300 mL  Status:  Discontinued        1,500 mg 150 mL/hr over 120 Minutes Intravenous Every 48 hours 04/08/21 0751 04/09/21 1454   04/06/21 2200  fluconazole (DIFLUCAN) tablet 200 mg  Status:  Discontinued        200 mg Oral Daily at bedtime 04/06/21 1232 04/09/21 1454   04/04/21 1800  vancomycin (VANCOREADY) IVPB 1250 mg/250 mL  Status:  Discontinued        1,250 mg 166.7 mL/hr over 90 Minutes Intravenous Every 48 hours 04/03/21 1501 04/08/21 0751   04/03/21 0600  piperacillin-tazobactam (ZOSYN)  IVPB 3.375 g        3.375 g 12.5 mL/hr over 240 Minutes Intravenous On call to O.R. 04/02/21 1603 04/03/21 0916   04/03/21 0200  piperacillin-tazobactam (ZOSYN) IVPB 3.375 g  Status:  Discontinued        3.375 g 12.5 mL/hr over 240 Minutes Intravenous Every 8 hours 04/02/21 2143 04/09/21 1454   04/02/21 2245  fluconazole (DIFLUCAN) IVPB 200 mg  Status:  Discontinued        200 mg 100 mL/hr over 60 Minutes Intravenous Every 24 hours 04/02/21 2147 04/06/21 1231   04/02/21 2245  vancomycin (VANCOREADY) IVPB 2000 mg/400 mL        2,000 mg 200 mL/hr over 120 Minutes Intravenous  Once 04/02/21 2158 04/03/21 0142   04/02/21 2158  vancomycin variable dose per unstable renal function (pharmacist dosing)  Status:  Discontinued         Does not apply See admin instructions 04/02/21 2159 04/03/21 1501   03/25/21 1200  vancomycin (VANCOCIN) IVPB 1000 mg/200 mL premix        1,000 mg 200 mL/hr over 60 Minutes Intravenous  Once 03/25/21 0512 03/25/21 1206   03/25/21 0600  ceFAZolin (ANCEF) IVPB 2g/100 mL premix        2 g 200 mL/hr over 30 Minutes Intravenous Every 8 hours 03/25/21 0512 03/26/21 2217   03/24/21 1800  ceFAZolin (ANCEF) IVPB 2g/100 mL premix        2 g 200 mL/hr over 30 Minutes Intravenous To Surgery 03/24/21 1738 03/25/21 0308   03/24/21 1800  ceFAZolin (ANCEF) IVPB 2g/100 mL premix  Status:  Discontinued        2 g 200 mL/hr over 30 Minutes Intravenous To Surgery 03/24/21 1734 03/25/21 0512   03/24/21 1800  vancomycin (VANCOREADY) IVPB 1250 mg/250 mL        1,250 mg 166.7 mL/hr over 90 Minutes Intravenous To Surgery 03/24/21 1738 03/24/21 2114       Assessment/Plan: Chronically incarcerated ventral hernia Bowel perforation -POD #10 s/p ex lap, adhesiolysis, extended R hemicolectomy, primary incisional hernia repair, ileostomy creation, abdominal washout, drain placement x2, incisional wound vac placement 30x8x4cm - Dr. Bobbye Morton 04/02/21 -tolerating HH diet and cont Glucerna -TNA  off -working with [place] on ileostomy care -midline wound with no further oozing.  Replace VAC today for MWF schedule -mobilize -DC RLQ JP drain 1/4, DC LLQ JP on 1/5 -multimodal pain control working well -WBC downtrending to 15 -surgically stable at this time for DC to rehab when felt medically stable -will be PRN for the weekend and will recheck her on Monday if not in CIR  FEN - HH diet, Glucerna VTE - heparin, ID - Vanc/zosyn - stopped 1/3  ABL anemia - hgb 6.8 today S/p ascending aortic replacement Acute on chronic kidney disease A fib obesity   Henreitta Cea PA-C 04/12/2021

## 2021-04-12 NOTE — Progress Notes (Signed)
Inpatient Rehab Admissions Coordinator:   Pt..'s sister has located an active insurance policy for this pt. We will have to hold CIR admission and get insurance auth before bringing her.   Clemens Catholic, [place], [place] Admissions Coordinator  [phone] ([place]) [phone] (office)

## 2021-04-13 LAB — TYPE AND SCREEN
ABO/RH(D): A POS
Antibody Screen: NEGATIVE
Unit division: 0
Unit division: 0

## 2021-04-13 LAB — GLUCOSE, CAPILLARY
Glucose-Capillary: 114 mg/dL — ABNORMAL HIGH (ref 70–99)
Glucose-Capillary: 161 mg/dL — ABNORMAL HIGH (ref 70–99)
Glucose-Capillary: 104 mg/dL — ABNORMAL HIGH (ref 70–99)
Glucose-Capillary: 178 mg/dL — ABNORMAL HIGH (ref 70–99)

## 2021-04-13 LAB — BPAM RBC
Blood Product Expiration Date: 202301182359
Blood Product Expiration Date: 202301212359
ISSUE DATE / TIME: 202301031009
ISSUE DATE / TIME: 202301061115
Unit Type and Rh: 6200
Unit Type and Rh: 6200

## 2021-04-13 LAB — BASIC METABOLIC PANEL
Anion gap: 9 (ref 5–15)
BUN: 37 mg/dL — ABNORMAL HIGH (ref 6–20)
CO2: 29 mmol/L (ref 22–32)
Calcium: 8.4 mg/dL — ABNORMAL LOW (ref 8.9–10.3)
Chloride: 102 mmol/L (ref 98–111)
Creatinine, Ser: 2.3 mg/dL — ABNORMAL HIGH (ref 0.44–1.00)
GFR, Estimated: 24 mL/min — ABNORMAL LOW (ref >60)
Glucose, Bld: 122 mg/dL — ABNORMAL HIGH (ref 70–99)
Potassium: 4.6 mmol/L (ref 3.5–5.1)
Sodium: 140 mmol/L (ref 135–145)

## 2021-04-13 LAB — CBC
HCT: 24 % — ABNORMAL LOW (ref 36.0–46.0)
Hemoglobin: 7.8 g/dL — ABNORMAL LOW (ref 12.0–15.0)
MCH: 32 pg (ref 26.0–34.0)
MCHC: 32.5 g/dL (ref 30.0–36.0)
MCV: 98.4 fL (ref 80.0–100.0)
Platelets: 423 10*3/uL — ABNORMAL HIGH (ref 150–400)
RBC: 2.44 MIL/uL — ABNORMAL LOW (ref 3.87–5.11)
RDW: 17.5 % — ABNORMAL HIGH (ref 11.5–15.5)
WBC: 15.7 10*3/uL — ABNORMAL HIGH (ref 4.0–10.5)
nRBC: 0 % (ref 0.0–0.2)

## 2021-04-13 NOTE — Progress Notes (Signed)
PROGRESS NOTE    Dorothy Harris  HER:[id number] DOB: 1960-09-16 DOA: 03/24/2021 PCP: Pcp, No    Chief Complaint  Patient presents with   Chest Pain    Brief Narrative:   Presented to [facility] 12/18 w/ cc: chest pain and SOB. 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 Started on esmolol, seen by CVTS; went to OR where she underwent repair and replacement of ascending aorta and replacement of aortic arch w/ Hemashield graft. Returned to surgical ICU post op on 12/19  She subsequently developed abdominal pain needing to go back to operating room on 12/27.  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  Extended right hemicolectomy and ileostomy.  She eventually transferred to floor and TRH taking over starting 04/06/21 1/1.  Patient has significant short of breath and wheezing, elevated BNP, started on IV Lasix.  Most recent TEE showed ejection fraction more than 65%.   Subjective:  She was not able to go to rehab due to insurance issues   RN reports patient left breast and left arm is edematous, no lower extremity edema She is on room air this morning  + wound vac Currently She is sitting up in chair,  She denies acute issues, family at bedside  No fever  tolerating oral intake, + ostomy output  Assessment & Plan:   Principal Problem:   Status post surgery Active Problems:   Acute on chronic diastolic CHF (congestive heart failure) (HCC)   S/P ascending aortic replacement   Encounter for weaning from ventilator ([place])   Encounter for postanesthesia care   Peritonitis ([place])   Endotracheally intubated   Acute renal failure superimposed on stage 3a chronic kidney disease ([place])   Malnutrition of moderate degree   Acute hypoxemic respiratory failure (HCC)   Type A aortic dissection.   she underwent repair and replacement of ascending aorta and replacement of aortic arch w/ Hemashield graft on  12/19 Management per vascular surgery  Peritonitis secondary to incarcerated colon and perforated cecal volvulus and ischemic colon: Status post extended right 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 Finished abx treatment Off tpn Colonstomy is functioning Wound vac placed  Plan per general surgery  Blood loss anemia S/p mulitple prbc ,cryo, ffp plt transfusion  Hgb dropped to 6.8 on 1/6, hgb improved after  prbc,x1  no other overt sign of bleeding  Monitor sign of bleeding, monitor cbc  Afib/RVR on 12/31, paroxysmal , post op Intraoperative echocardiogram 12/18 with ejection fraction more than 65%. Off amiodarone drip, now on oral amiodarone, not a candidate for anticoagulation currently , need to be address after being discharged  Currently sinus rhythm  Acute on chronic diastolic chf, -acute hypoxic respiratory failure Developed sob with hypxia on 1/1, likely due to volume overload from blood product transfusion and tpn, she received iv lasix Improved Lasix held due to elevation of cr Weaned off  oxygen, on room air on 1/7 Monitor volume status  May need lasix prn  Left arm /left breast edema Wonder dependent edema from laying on left side Will elevate left arm/breast Rotate iv out of left arm Get venous doppler to rule out DVT   AKI on CKDIIIa Lasix held since 1/5 due to elevated Cr start to trend up,  Cr improving, Renal dosing meds  HTN, bp stable  Physical deconditioning Patient report still walks prior to coming to the hospital Out of bed Wean oxygen Working on CIR placement   Nutritional Assessment:  The patients BMI is: Body mass index is 37.91 kg/m.Marland Kitchen  Seen by dietician.  I agree with the assessment and plan as outlined below:  Nutrition Status: Nutrition Problem: Moderate Malnutrition Etiology: acute illness (aortic disection repair, bowel perforation) Signs/Symptoms: mild muscle depletion, energy intake < or equal to 50% for > or equal to 5  days Interventions: TPN  .    Unresulted Labs (From admission, onward)     Start     Ordered   Signed and Held  Comprehensive metabolic panel  Tomorrow morning,   R       Question:  Specimen collection method  Answer:  Unit=Unit collect   Signed and Held   Signed and Held  Basic metabolic panel  Weekly,   R     Question:  Specimen collection method  Answer:  Unit=Unit collect   Signed and Held   Signed and Held  CBC WITH DIFFERENTIAL  Tomorrow morning,   R       Question:  Specimen collection method  Answer:  Unit=Unit collect   Signed and Held   Signed and Held  CBC  (enoxaparin (LOVENOX)    CrCl >/= 30 ml/min)  Weekly,   R     Comments: Baseline for enoxaparin therapy IF NOT ALREADY DRAWN.  Notify MD if PLT < 100 K.   Question:  Specimen collection method  Answer:  Unit=Unit collect   Signed and Held              DVT prophylaxis: heparin injection 5,000 Units Start: 04/10/21 0600 SCDs Start: 03/27/21 0857   Code Status: full Family Communication: patient , family at bedside on 1/7 Disposition:   Status is: Inpatient  Dispo: The patient is from: home              Anticipated d/c is to: CIR              Anticipated d/c date is: medically stable to d/c to CIR, awaiting for insurance                Consultants:  Thoracic surgery General surgery Critical care Wound care  Procedures:  As above  Antimicrobials:    Anti-infectives (From admission, onward)    Start     Dose/Rate Route Frequency Ordered Stop   04/08/21 1800  vancomycin (VANCOREADY) IVPB 1500 mg/300 mL  Status:  Discontinued        1,500 mg 150 mL/hr over 120 Minutes Intravenous Every 48 hours 04/08/21 0751 04/09/21 1454   04/06/21 2200  fluconazole (DIFLUCAN) tablet 200 mg  Status:  Discontinued        200 mg Oral Daily at bedtime 04/06/21 1232 04/09/21 1454   04/04/21 1800  vancomycin (VANCOREADY) IVPB 1250 mg/250 mL  Status:  Discontinued        1,250 mg 166.7 mL/hr over 90 Minutes  Intravenous Every 48 hours 04/03/21 1501 04/08/21 0751   04/03/21 0600  piperacillin-tazobactam (ZOSYN) IVPB 3.375 g        3.375 g 12.5 mL/hr over 240 Minutes Intravenous On call to O.R. 04/02/21 1603 04/03/21 0916   04/03/21 0200  piperacillin-tazobactam (ZOSYN) IVPB 3.375 g  Status:  Discontinued        3.375 g 12.5 mL/hr over 240 Minutes Intravenous Every 8 hours 04/02/21 2143 04/09/21 1454   04/02/21 2245  fluconazole (DIFLUCAN) IVPB 200 mg  Status:  Discontinued        200 mg 100 mL/hr over 60 Minutes Intravenous Every  24 hours 04/02/21 2147 04/06/21 1231   04/02/21 2245  vancomycin (VANCOREADY) IVPB 2000 mg/400 mL        2,000 mg 200 mL/hr over 120 Minutes Intravenous  Once 04/02/21 2158 04/03/21 0142   04/02/21 2158  vancomycin variable dose per unstable renal function (pharmacist dosing)  Status:  Discontinued         Does not apply See admin instructions 04/02/21 2159 04/03/21 1501   03/25/21 1200  vancomycin (VANCOCIN) IVPB 1000 mg/200 mL premix        1,000 mg 200 mL/hr over 60 Minutes Intravenous  Once 03/25/21 0512 03/25/21 1206   03/25/21 0600  ceFAZolin (ANCEF) IVPB 2g/100 mL premix        2 g 200 mL/hr over 30 Minutes Intravenous Every 8 hours 03/25/21 0512 03/26/21 2217   03/24/21 1800  ceFAZolin (ANCEF) IVPB 2g/100 mL premix        2 g 200 mL/hr over 30 Minutes Intravenous To Surgery 03/24/21 1738 03/25/21 0308   03/24/21 1800  ceFAZolin (ANCEF) IVPB 2g/100 mL premix  Status:  Discontinued        2 g 200 mL/hr over 30 Minutes Intravenous To Surgery 03/24/21 1734 03/25/21 0512   03/24/21 1800  vancomycin (VANCOREADY) IVPB 1250 mg/250 mL        1,250 mg 166.7 mL/hr over 90 Minutes Intravenous To Surgery 03/24/21 1738 03/24/21 2114          Objective: Vitals:   04/13/21 0249 04/13/21 0500 04/13/21 0845 04/13/21 1200  BP: 140/80  139/78 118/65  Pulse: 75  82 69  Resp: $Remo'20  18 10  'YYLnW$ Temp: 98.6 F (37 C)  98.5 F (36.9 C) 98.5 F (36.9 C)  TempSrc: Oral  Oral  Oral  SpO2: 96%  96% 96%  Weight:  109.8 kg    Height:        Intake/Output Summary (Last 24 hours) at 04/13/2021 1456 Last data filed at 04/13/2021 1300 Gross per 24 hour  Intake 720 ml  Output 1645 ml  Net -925 ml   Filed Weights   04/09/21 0500 04/11/21 0500 04/13/21 0500  Weight: 105 kg 109.8 kg 109.8 kg    Examination:  General exam: alert, awake, communicative,calm, NAD Respiratory system: diminished at basis, no wheezing, no rales, no rhonchi. Respiratory effort normal. Cardiovascular system:  RRR, + murmur best heard at right upper sternal border   Gastrointestinal system: + colostomy with formed and liquid stool ,  Normal bowel sounds heard. Central nervous system: Alert and oriented. No focal neurological deficits. Extremities:  no lower extremity edema, left arm and left breast edematous , nontender, no erythema  Skin: No rashes, lesions or ulcers Psychiatry: Judgement and insight appear normal. Mood & affect appropriate.     Data Reviewed: I have personally reviewed following labs and imaging studies  CBC: Recent Labs  Lab 04/09/21 0500 04/10/21 0402 04/11/21 0450 04/12/21 0445 04/13/21 0146  WBC 17.8* 17.0* 17.0* 15.7* 15.7*  NEUTROABS 13.7* 13.3*  --   --   --   HGB 6.6* 7.0* 7.1* 6.8* 7.8*  HCT 19.7* 21.2* 21.6* 21.5* 24.0*  MCV 94.7 94.6 96.9 98.2 98.4  PLT 284 283 367 410* 423*    Basic Metabolic Panel: Recent Labs  Lab 04/07/21 0307 04/08/21 0546 04/09/21 0500 04/10/21 0402 04/11/21 0450 04/12/21 0445 04/13/21 0146  NA 143 142 140 139 139 139 140  K 3.8 4.2 4.5 4.9 4.7 4.5 4.6  CL 104 102 100 98 98 100  102  CO2 $Re'26 30 31 31 'Ikg$ 32 30 29  GLUCOSE 277* 130* 155* 162* 142* 109* 122*  BUN 56* 49* 49* 46* 42* 41* 37*  CREATININE 1.81* 1.83* 1.92* 2.17* 2.34* 2.25* 2.30*  CALCIUM 8.3* 8.4* 7.9* 8.1* 8.2* 8.1* 8.4*  MG 1.8 1.6* 2.0 1.8 2.0  --   --   PHOS 2.5 3.7 4.3 5.1*  --   --   --     GFR: Estimated Creatinine Clearance: 33.2 mL/min (A)  (by C-G formula based on SCr of 2.3 mg/dL (H)).  Liver Function Tests: Recent Labs  Lab 04/08/21 0546 04/10/21 0402  AST 37 27  ALT 38 33  ALKPHOS 131* 100  BILITOT 2.4* 1.8*  PROT 6.1* 5.8*  ALBUMIN 1.9* 1.8*    CBG: Recent Labs  Lab 04/12/21 1103 04/12/21 1607 04/12/21 2103 04/13/21 0606 04/13/21 1251  GLUCAP 123* 125* 124* 114* 161*     No results found for this or any previous visit (from the past 240 hour(s)).       Radiology Studies: No results found.      Scheduled Meds:  acetaminophen  1,000 mg Oral Q6H   amiodarone  200 mg Oral BID   aspirin  324 mg Oral Daily   chlorhexidine  15 mL Mouth Rinse BID   Chlorhexidine Gluconate Cloth  6 each Topical Daily   feeding supplement (GLUCERNA SHAKE)  237 mL Oral TID BM   heparin  5,000 Units Subcutaneous Q8H   insulin aspart  0-24 Units Subcutaneous Q6H   ipratropium-albuterol  3 mL Nebulization BID   mouth rinse  15 mL Mouth Rinse q12n4p   metoprolol tartrate  12.5 mg Oral BID   multivitamin with minerals  1 tablet Oral Daily   pantoprazole  40 mg Oral Daily   sodium chloride flush  10-40 mL Intracatheter Q12H   Continuous Infusions:   LOS: 20 days   Time spent: 14mins Greater than 50% of this time was spent in counseling, explanation of diagnosis, planning of further management, and coordination of care.   Voice Recognition Viviann Spare dictation system was used to create this note, attempts have been made to correct errors. Please contact the author with questions and/or clarifications.   Florencia Reasons, MD PhD FACP [facility]  Available via Epic secure chat 7am-7pm for nonurgent issues Please page for urgent issues To page the attending provider between 7A-7P or the covering provider during after hours 7P-7A, please log into the web site [email or web address] and access using universal [place] password for that web site. If you do not have the password, please call the hospital  operator.    04/13/2021, 2:56 PM

## 2021-04-13 NOTE — Progress Notes (Signed)
° °   °  [address]       [place] [address]             [phone]        11 Days Post-Op Procedure(s) (LRB): EXPLORATORY LAPAROTOMY (N/A) EXTENDED COLON RESECTION ILEOSTOMY (N/A) HERNIA REPAIR VENTRAL ADULT LYSIS OF ADHESION, EXTENSIVE (N/A)  Subjective: Patient states bed is uncomfortable;her lower back hurts because of position in bed  Objective: Vital signs in last 24 hours: Temp:  [98.2 F (36.8 C)-98.6 F (37 C)] 98.6 F (37 C) (01/07 0249) Pulse Rate:  [60-75] 75 (01/07 0249) Cardiac Rhythm: Normal sinus rhythm (01/06 1920) Resp:  [14-20] 20 (01/07 0249) BP: (115-140)/(65-80) 140/80 (01/07 0249) SpO2:  [96 %-100 %] 96 % (01/07 0249) Weight:  [109.8 kg] 109.8 kg (01/07 0500)   Current Weight  04/13/21 109.8 kg       Intake/Output from previous day: 01/06 0701 - 01/07 0700 In: 992.5 [P.O.:480; I.V.:10; Blood:502.5] Out: 1845 [Urine:1500; Drains:20; Stool:325]   Physical Exam:  Cardiovascular: RRR, no murmur Pulmonary: Clear to auscultation bilaterally Abdomen: Soft, non tender, bowel sounds present. VAC present on abdominal wound. Ileostomy with output Extremities:Trace bilateral lower extremity edema. Wounds: Sternal wound with eschar. Right axillary wound is clean and dry.  Lab Results: CBC: Recent Labs    04/12/21 0445 04/13/21 0146  WBC 15.7* 15.7*  HGB 6.8* 7.8*  HCT 21.5* 24.0*  PLT 410* 423*   BMET:  Recent Labs    04/12/21 0445 04/13/21 0146  NA 139 140  K 4.5 4.6  CL 100 102  CO2 30 29  GLUCOSE 109* 122*  BUN 41* 37*  CREATININE 2.25* 2.30*  CALCIUM 8.1* 8.4*    PT/INR:  Lab Results  Component Value Date   INR 1.4 (H) 03/25/2021   INR 1.1 03/24/2021   INR 1.04 12/23/2015   ABG:  INR: Will add last result for INR, ABG once components are confirmed Will add last 4 CBG results once components are confirmed  Assessment/Plan:  1. CV - Post op a fib (after ex lap). Maintaining SR, first degree heart  block with HR in the 70's this am. On Amiodarone 200 mg bid, Lopressor 12.5 mg bid with fairly good BP control 2.  Pulmonary - On 2 liters of oxygen via . Wean as able. Encourage incentive spirometer. 3. GI-tolerating diet, Glucerna. Ileostomy functioning well-325 output last 24 hours 4.  Expected post op acute blood loss anemia - H and H this am increased to 7.8 and 24 (s/p transfusion). 5. CBGs 125/124/114. Pre op HGA1C 5.9. 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 History of CKD (stage III)-Creatinine 2.3 this am 7. Leukocytosis-WBC remains 15,700. Afebrile.  8. Deconditioned-PT/OT 9. To CIR when insurance approves  Cashlyn Huguley M ZimmermanPA-C 04/13/2021,8:08 AM

## 2021-04-14 ENCOUNTER — Encounter (HOSPITAL_COMMUNITY): Payer: PRIVATE HEALTH INSURANCE

## 2021-04-14 LAB — CBC WITH DIFFERENTIAL/PLATELET
Abs Immature Granulocytes: 0.14 10*3/uL — ABNORMAL HIGH (ref 0.00–0.07)
Basophils Absolute: 0 10*3/uL (ref 0.0–0.1)
Basophils Relative: 0 %
Eosinophils Absolute: 0.2 10*3/uL (ref 0.0–0.5)
Eosinophils Relative: 1 %
HCT: 23.8 % — ABNORMAL LOW (ref 36.0–46.0)
Hemoglobin: 7.6 g/dL — ABNORMAL LOW (ref 12.0–15.0)
Immature Granulocytes: 1 %
Lymphocytes Relative: 9 %
Lymphs Abs: 1.3 10*3/uL (ref 0.7–4.0)
MCH: 31.8 pg (ref 26.0–34.0)
MCHC: 31.9 g/dL (ref 30.0–36.0)
MCV: 99.6 fL (ref 80.0–100.0)
Monocytes Absolute: 1.1 10*3/uL — ABNORMAL HIGH (ref 0.1–1.0)
Monocytes Relative: 8 %
Neutro Abs: 11.6 10*3/uL — ABNORMAL HIGH (ref 1.7–7.7)
Neutrophils Relative %: 81 %
Platelets: 483 10*3/uL — ABNORMAL HIGH (ref 150–400)
RBC: 2.39 MIL/uL — ABNORMAL LOW (ref 3.87–5.11)
RDW: 17.3 % — ABNORMAL HIGH (ref 11.5–15.5)
WBC: 14.3 10*3/uL — ABNORMAL HIGH (ref 4.0–10.5)
nRBC: 0 % (ref 0.0–0.2)

## 2021-04-14 LAB — GLUCOSE, CAPILLARY
Glucose-Capillary: 159 mg/dL — ABNORMAL HIGH (ref 70–99)
Glucose-Capillary: 156 mg/dL — ABNORMAL HIGH (ref 70–99)
Glucose-Capillary: 124 mg/dL — ABNORMAL HIGH (ref 70–99)
Glucose-Capillary: 96 mg/dL (ref 70–99)

## 2021-04-14 LAB — BASIC METABOLIC PANEL
Anion gap: 9 (ref 5–15)
BUN: 29 mg/dL — ABNORMAL HIGH (ref 6–20)
CO2: 27 mmol/L (ref 22–32)
Calcium: 8.3 mg/dL — ABNORMAL LOW (ref 8.9–10.3)
Chloride: 103 mmol/L (ref 98–111)
Creatinine, Ser: 2.15 mg/dL — ABNORMAL HIGH (ref 0.44–1.00)
GFR, Estimated: 26 mL/min — ABNORMAL LOW (ref >60)
Glucose, Bld: 105 mg/dL — ABNORMAL HIGH (ref 70–99)
Potassium: 5 mmol/L (ref 3.5–5.1)
Sodium: 139 mmol/L (ref 135–145)

## 2021-04-14 LAB — MAGNESIUM: Magnesium: 2 mg/dL (ref 1.7–2.4)

## 2021-04-14 MED ORDER — FUROSEMIDE 40 MG PO TABS
40.0000 mg | ORAL_TABLET | ORAL | Status: DC
  Administered 2021-04-15: 40 mg via ORAL
  Filled 2021-04-14: qty 1

## 2021-04-14 MED ORDER — AMIODARONE HCL 200 MG PO TABS
200.0000 mg | ORAL_TABLET | Freq: Every day | ORAL | Status: DC
  Administered 2021-04-15: 200 mg via ORAL
  Filled 2021-04-14: qty 1

## 2021-04-14 NOTE — Progress Notes (Signed)
° °   °  [address]       [place] [address]             [phone]        12 Days Post-Op Procedure(s) (LRB): EXPLORATORY LAPAROTOMY (N/A) EXTENDED COLON RESECTION ILEOSTOMY (N/A) HERNIA REPAIR VENTRAL ADULT LYSIS OF ADHESION, EXTENSIVE (N/A)  Subjective: Patient asking to be cleaned up this am. She did walk one time yesterday. She denies nausea or vomiting.  Objective: Vital signs in last 24 hours: Temp:  [98.3 F (36.8 C)-98.7 F (37.1 C)] 98.4 F (36.9 C) (01/08 0809) Pulse Rate:  [64-82] 64 (01/08 0809) Cardiac Rhythm: Normal sinus rhythm (01/07 2300) Resp:  [10-18] 16 (01/08 0809) BP: (112-139)/(65-81) 112/81 (01/08 0809) SpO2:  [91 %-96 %] 92 % (01/08 0809)   Current Weight  04/13/21 109.8 kg       Intake/Output from previous day: 01/07 0701 - 01/08 0700 In: 480 [P.O.:480] Out: 1800 [Urine:1300; Stool:500]   Physical Exam:  Cardiovascular: RRR, no murmur Pulmonary: Clear to auscultation bilaterally Abdomen: Soft, non tender, bowel sounds present. VAC present on abdominal wound. Ileostomy with output Extremities:Trace bilateral lower extremity edema. Wounds: Sternal wound with eschar. Right axillary wound is clean and dry.  Lab Results: CBC: Recent Labs    04/13/21 0146 04/14/21 0140  WBC 15.7* 14.3*  HGB 7.8* 7.6*  HCT 24.0* 23.8*  PLT 423* 483*    BMET:  Recent Labs    04/13/21 0146 04/14/21 0140  NA 140 139  K 4.6 5.0  CL 102 103  CO2 29 27  GLUCOSE 122* 105*  BUN 37* 29*  CREATININE 2.30* 2.15*  CALCIUM 8.4* 8.3*     PT/INR:  Lab Results  Component Value Date   INR 1.4 (H) 03/25/2021   INR 1.1 03/24/2021   INR 1.04 12/23/2015   ABG:  INR: Will add last result for INR, ABG once components are confirmed Will add last 4 CBG results once components are confirmed  Assessment/Plan:  1. CV - Post op a fib (after ex lap). Maintaining SR, first degree heart block with HR in the 60's this am. On Amiodarone  200 mg bid, Lopressor 12.5 mg bid with fairly good BP control 2.  Pulmonary - On room air. Encourage incentive spirometer. 3. GI-tolerating diet, Glucerna. Ileostomy functioning well-500 output last 24 hours 4.  Expected post op acute blood loss anemia - H and H this am increased to 7.6 and 23.8 (previous transfusion). 5. CBGs 178/159/156. Pre op HGA1C 5.9. 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 History of CKD (stage III)-Creatinine decreased to 2.15 this am 7. Leukocytosis-WBC decreased to 14,300 Afebrile.  8. Deconditioned-PT/OT 9. To CIR when insurance approves  [name] [facility] 04/14/2021,8:28 AM

## 2021-04-14 NOTE — Progress Notes (Signed)
RN and NT offered to help patient get up to chair x2.  Patient stated that she did not want to get out of bed today and would resume out of bed activities tomorrow.

## 2021-04-14 NOTE — Progress Notes (Signed)
PROGRESS NOTE    Dorothy Harris  TOI:[id number] DOB: 1960-12-07 DOA: 03/24/2021 PCP: Pcp, No    Chief Complaint  Patient presents with   Chest Pain    Brief Narrative:   Presented to [facility] 12/18 w/ cc: chest pain and SOB. 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 Started on esmolol, seen by CVTS; went to OR where she underwent repair and replacement of ascending aorta and replacement of aortic arch w/ Hemashield graft. Returned to surgical ICU post op on 12/19  She subsequently developed abdominal pain needing to go back to operating room on 12/27.  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  Extended right hemicolectomy and ileostomy.  She eventually transferred to floor and TRH taking over starting 04/06/21 1/1.  Patient has significant short of breath and wheezing, elevated BNP, started on IV Lasix.  Most recent TEE showed ejection fraction more than 65%.   Subjective:  She was not able to go to rehab due to insurance issues, she is medically stable to go to SNF  Left arm edema has much improved  tolerating oral intake, + ostomy output + wound vac  No fever   Assessment & Plan:   Principal Problem:   Status post surgery Active Problems:   Acute on chronic diastolic CHF (congestive heart failure) (HCC)   S/P ascending aortic replacement   Encounter for weaning from ventilator ([place])   Encounter for postanesthesia care   Peritonitis ([place])   Endotracheally intubated   Acute renal failure superimposed on stage 3a chronic kidney disease ([place])   Malnutrition of moderate degree   Acute hypoxemic respiratory failure (HCC)   Type A aortic dissection.   she underwent repair and replacement of ascending aorta and replacement of aortic arch w/ Hemashield graft on 12/19 Management per vascular surgery  Peritonitis secondary to incarcerated colon and perforated cecal volvulus and ischemic  colon: Status post extended right 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 Finished abx treatment Off tpn Colonstomy is functioning Wound vac placed  Plan per general surgery  Blood loss anemia S/p mulitple prbc ,cryo, ffp plt transfusion  Hgb dropped to 6.8 on 1/6, hgb improved after  prbc,x1  no other overt sign of bleeding  Monitor sign of bleeding, monitor cbc  Afib/RVR on 12/31, paroxysmal , post op Intraoperative echocardiogram 12/18 with ejection fraction more than 65%. Off amiodarone drip, now on oral amiodarone, not a candidate for anticoagulation currently , need to be address after being discharged  Currently sinus rhythm  Acute on chronic diastolic chf, -acute hypoxic respiratory failure Developed sob with hypxia on 1/1, likely due to volume overload from blood product transfusion and tpn, she received iv lasix Lasix held due to elevation of cr Start  on  lasix qmwf on 1/9 , Monitor volume status /cr level, may need to continue adjust lasix dose and frequency   Left arm /left breast edema Wonder dependent edema from laying on left side Will elevate left arm/breast Rotate iv out of left arm venous doppler negative for DVT on 1/1 Improving , mobilize  Start on lasix mwf from 1/9   AKI on CKDIIIa Lasix held since 1/5 due to elevated Cr start to trend up,  Cr improving, Renal dosing meds  HTN, bp stable  Physical deconditioning Patient report still walks prior to coming to the hospital Out of bed Wean oxygen Working on CIR placement   Nutritional Assessment:  The patients BMI is: Body mass  index is 37.91 kg/m.Marland Kitchen  Seen by dietician.  I agree with the assessment and plan as outlined below:  Nutrition Status: Nutrition Problem: Moderate Malnutrition Etiology: acute illness (aortic disection repair, bowel perforation) Signs/Symptoms: mild muscle depletion, energy intake < or equal to 50% for > or equal to 5 days Interventions: TPN  .    Unresulted Labs (From  admission, onward)     Start     Ordered   Signed and Held  Comprehensive metabolic panel  Tomorrow morning,   R       Question:  Specimen collection method  Answer:  Unit=Unit collect   Signed and Held   Signed and Held  Basic metabolic panel  Weekly,   R     Question:  Specimen collection method  Answer:  Unit=Unit collect   Signed and Held   Signed and Held  CBC WITH DIFFERENTIAL  Tomorrow morning,   R       Question:  Specimen collection method  Answer:  Unit=Unit collect   Signed and Held   Signed and Held  CBC  (enoxaparin (LOVENOX)    CrCl >/= 30 ml/min)  Weekly,   R     Comments: Baseline for enoxaparin therapy IF NOT ALREADY DRAWN.  Notify MD if PLT < 100 K.   Question:  Specimen collection method  Answer:  Unit=Unit collect   Signed and Held              DVT prophylaxis: heparin injection 5,000 Units Start: 04/10/21 0600 SCDs Start: 03/27/21 0857   Code Status: full Family Communication: patient , family at bedside on 1/7 Disposition:   Status is: Inpatient  Dispo: The patient is from: home              Anticipated d/c is to: CIR              Anticipated d/c date is: medically stable to d/c to CIR, awaiting for insurance                Consultants:  Thoracic surgery General surgery Critical care Wound care  Procedures:  As above  Antimicrobials:    Anti-infectives (From admission, onward)    Start     Dose/Rate Route Frequency Ordered Stop   04/08/21 1800  vancomycin (VANCOREADY) IVPB 1500 mg/300 mL  Status:  Discontinued        1,500 mg 150 mL/hr over 120 Minutes Intravenous Every 48 hours 04/08/21 0751 04/09/21 1454   04/06/21 2200  fluconazole (DIFLUCAN) tablet 200 mg  Status:  Discontinued        200 mg Oral Daily at bedtime 04/06/21 1232 04/09/21 1454   04/04/21 1800  vancomycin (VANCOREADY) IVPB 1250 mg/250 mL  Status:  Discontinued        1,250 mg 166.7 mL/hr over 90 Minutes Intravenous Every 48 hours 04/03/21 1501 04/08/21 0751    04/03/21 0600  piperacillin-tazobactam (ZOSYN) IVPB 3.375 g        3.375 g 12.5 mL/hr over 240 Minutes Intravenous On call to O.R. 04/02/21 1603 04/03/21 0916   04/03/21 0200  piperacillin-tazobactam (ZOSYN) IVPB 3.375 g  Status:  Discontinued        3.375 g 12.5 mL/hr over 240 Minutes Intravenous Every 8 hours 04/02/21 2143 04/09/21 1454   04/02/21 2245  fluconazole (DIFLUCAN) IVPB 200 mg  Status:  Discontinued        200 mg 100 mL/hr over 60 Minutes Intravenous Every 24 hours 04/02/21 2147 04/06/21 1231  04/02/21 2245  vancomycin (VANCOREADY) IVPB 2000 mg/400 mL        2,000 mg 200 mL/hr over 120 Minutes Intravenous  Once 04/02/21 2158 04/03/21 0142   04/02/21 2158  vancomycin variable dose per unstable renal function (pharmacist dosing)  Status:  Discontinued         Does not apply See admin instructions 04/02/21 2159 04/03/21 1501   03/25/21 1200  vancomycin (VANCOCIN) IVPB 1000 mg/200 mL premix        1,000 mg 200 mL/hr over 60 Minutes Intravenous  Once 03/25/21 0512 03/25/21 1206   03/25/21 0600  ceFAZolin (ANCEF) IVPB 2g/100 mL premix        2 g 200 mL/hr over 30 Minutes Intravenous Every 8 hours 03/25/21 0512 03/26/21 2217   03/24/21 1800  ceFAZolin (ANCEF) IVPB 2g/100 mL premix        2 g 200 mL/hr over 30 Minutes Intravenous To Surgery 03/24/21 1738 03/25/21 0308   03/24/21 1800  ceFAZolin (ANCEF) IVPB 2g/100 mL premix  Status:  Discontinued        2 g 200 mL/hr over 30 Minutes Intravenous To Surgery 03/24/21 1734 03/25/21 0512   03/24/21 1800  vancomycin (VANCOREADY) IVPB 1250 mg/250 mL        1,250 mg 166.7 mL/hr over 90 Minutes Intravenous To Surgery 03/24/21 1738 03/24/21 2114          Objective: Vitals:   04/14/21 0809 04/14/21 1200 04/14/21 1736 04/14/21 1939  BP: 112/81 130/80 130/86 122/87  Pulse: 64 77 (!) 58 74  Resp: $Remo'16 16 11 18  'yNcMU$ Temp: 98.4 F (36.9 C) 98.2 F (36.8 C) 98.4 F (36.9 C) 98.6 F (37 C)  TempSrc: Oral Oral Oral Oral  SpO2: 92% 94%  96% 95%  Weight:      Height:        Intake/Output Summary (Last 24 hours) at 04/14/2021 2308 Last data filed at 04/14/2021 1957 Gross per 24 hour  Intake 720 ml  Output 1830 ml  Net -1110 ml   Filed Weights   04/09/21 0500 04/11/21 0500 04/13/21 0500  Weight: 105 kg 109.8 kg 109.8 kg    Examination:  General exam: alert, awake, communicative,calm, NAD Respiratory system: diminished at basis, no wheezing, no rales, no rhonchi. Respiratory effort normal. Cardiovascular system:  RRR, + murmur best heard at right upper sternal border   Gastrointestinal system: + colostomy with formed and liquid stool ,  Normal bowel sounds heard. Central nervous system: Alert and oriented. No focal neurological deficits. Extremities:  no lower extremity edema, left arm edema resolved, + left breast edema , nontender, no erythema  Skin: No rashes, lesions or ulcers Psychiatry: Judgement and insight appear normal. Mood & affect appropriate.     Data Reviewed: I have personally reviewed following labs and imaging studies  CBC: Recent Labs  Lab 04/09/21 0500 04/10/21 0402 04/11/21 0450 04/12/21 0445 04/13/21 0146 04/14/21 0140  WBC 17.8* 17.0* 17.0* 15.7* 15.7* 14.3*  NEUTROABS 13.7* 13.3*  --   --   --  11.6*  HGB 6.6* 7.0* 7.1* 6.8* 7.8* 7.6*  HCT 19.7* 21.2* 21.6* 21.5* 24.0* 23.8*  MCV 94.7 94.6 96.9 98.2 98.4 99.6  PLT 284 283 367 410* 423* 483*    Basic Metabolic Panel: Recent Labs  Lab 04/08/21 0546 04/09/21 0500 04/10/21 0402 04/11/21 0450 04/12/21 0445 04/13/21 0146 04/14/21 0140  NA 142 140 139 139 139 140 139  K 4.2 4.5 4.9 4.7 4.5 4.6 5.0  CL 102  100 98 98 100 102 103  CO2 $Re'30 31 31 'WjV$ 32 $R'30 29 27  'CL$ GLUCOSE 130* 155* 162* 142* 109* 122* 105*  BUN 49* 49* 46* 42* 41* 37* 29*  CREATININE 1.83* 1.92* 2.17* 2.34* 2.25* 2.30* 2.15*  CALCIUM 8.4* 7.9* 8.1* 8.2* 8.1* 8.4* 8.3*  MG 1.6* 2.0 1.8 2.0  --   --  2.0  PHOS 3.7 4.3 5.1*  --   --   --   --     GFR: Estimated  Creatinine Clearance: 35.5 mL/min (A) (by C-G formula based on SCr of 2.15 mg/dL (H)).  Liver Function Tests: Recent Labs  Lab 04/08/21 0546 04/10/21 0402  AST 37 27  ALT 38 33  ALKPHOS 131* 100  BILITOT 2.4* 1.8*  PROT 6.1* 5.8*  ALBUMIN 1.9* 1.8*    CBG: Recent Labs  Lab 04/13/21 1958 04/14/21 0621 04/14/21 0647 04/14/21 1201 04/14/21 1737  GLUCAP 178* 159* 156* 124* 96     No results found for this or any previous visit (from the past 240 hour(s)).       Radiology Studies: No results found.      Scheduled Meds:  acetaminophen  1,000 mg Oral Q6H   [START ON 04/15/2021] amiodarone  200 mg Oral Daily   aspirin  324 mg Oral Daily   chlorhexidine  15 mL Mouth Rinse BID   Chlorhexidine Gluconate Cloth  6 each Topical Daily   feeding supplement (GLUCERNA SHAKE)  237 mL Oral TID BM   [START ON 04/15/2021] furosemide  40 mg Oral Q M,W,F   heparin  5,000 Units Subcutaneous Q8H   insulin aspart  0-24 Units Subcutaneous Q6H   mouth rinse  15 mL Mouth Rinse q12n4p   metoprolol tartrate  12.5 mg Oral BID   multivitamin with minerals  1 tablet Oral Daily   pantoprazole  40 mg Oral Daily   sodium chloride flush  10-40 mL Intracatheter Q12H   Continuous Infusions:   LOS: 21 days   Time spent: 25mins Greater than 50% of this time was spent in counseling, explanation of diagnosis, planning of further management, and coordination of care.   Voice Recognition Viviann Spare dictation system was used to create this note, attempts have been made to correct errors. Please contact the author with questions and/or clarifications.   Florencia Reasons, MD PhD FACP [facility]  Available via Epic secure chat 7am-7pm for nonurgent issues Please page for urgent issues To page the attending provider between 7A-7P or the covering provider during after hours 7P-7A, please log into the web site [email or web address] and access using universal [place] password for that web site. If you do not  have the password, please call the hospital operator.    04/14/2021, 11:08 PM

## 2021-04-14 NOTE — Progress Notes (Signed)
Pt transferred from bed to chair with front wheeled walker and 1-assist.  Sat up in chair for approximately 2 hours, and then transferred back to bed. Pt tolerated activity well.

## 2021-04-15 ENCOUNTER — Inpatient Hospital Stay (HOSPITAL_COMMUNITY)
Admission: RE | Admit: 2021-04-15 | Discharge: 2021-04-26 | DRG: 949 | Disposition: A | Discharge status: Home Health | Payer: PRIVATE HEALTH INSURANCE | Source: Intra-hospital | Attending: Physical Medicine & Rehabilitation | Admitting: Physical Medicine & Rehabilitation

## 2021-04-15 ENCOUNTER — Encounter (HOSPITAL_COMMUNITY): Payer: Self-pay | Admitting: Physical Medicine & Rehabilitation

## 2021-04-15 ENCOUNTER — Other Ambulatory Visit: Payer: Self-pay

## 2021-04-15 DIAGNOSIS — F17210 Nicotine dependence, cigarettes, uncomplicated: Secondary | ICD-10-CM | POA: Diagnosis present

## 2021-04-15 DIAGNOSIS — E669 Obesity, unspecified: Secondary | ICD-10-CM | POA: Diagnosis present

## 2021-04-15 DIAGNOSIS — I5033 Acute on chronic diastolic (congestive) heart failure: Secondary | ICD-10-CM | POA: Diagnosis present

## 2021-04-15 DIAGNOSIS — I130 Hypertensive heart and chronic kidney disease with heart failure and stage 1 through stage 4 chronic kidney disease, or unspecified chronic kidney disease: Secondary | ICD-10-CM | POA: Diagnosis present

## 2021-04-15 DIAGNOSIS — Z841 Family history of disorders of kidney and ureter: Secondary | ICD-10-CM | POA: Diagnosis not present

## 2021-04-15 DIAGNOSIS — Z833 Family history of diabetes mellitus: Secondary | ICD-10-CM

## 2021-04-15 DIAGNOSIS — Z7982 Long term (current) use of aspirin: Secondary | ICD-10-CM | POA: Diagnosis not present

## 2021-04-15 DIAGNOSIS — Z79899 Other long term (current) drug therapy: Secondary | ICD-10-CM | POA: Diagnosis not present

## 2021-04-15 DIAGNOSIS — E785 Hyperlipidemia, unspecified: Secondary | ICD-10-CM | POA: Diagnosis present

## 2021-04-15 DIAGNOSIS — Z823 Family history of stroke: Secondary | ICD-10-CM

## 2021-04-15 DIAGNOSIS — N1832 Chronic kidney disease, stage 3b: Secondary | ICD-10-CM | POA: Diagnosis present

## 2021-04-15 DIAGNOSIS — Z8249 Family history of ischemic heart disease and other diseases of the circulatory system: Secondary | ICD-10-CM | POA: Diagnosis not present

## 2021-04-15 DIAGNOSIS — Z803 Family history of malignant neoplasm of breast: Secondary | ICD-10-CM

## 2021-04-15 DIAGNOSIS — R5381 Other malaise: Secondary | ICD-10-CM

## 2021-04-15 DIAGNOSIS — Z48812 Encounter for surgical aftercare following surgery on the circulatory system: Principal | ICD-10-CM

## 2021-04-15 DIAGNOSIS — I4891 Unspecified atrial fibrillation: Secondary | ICD-10-CM | POA: Diagnosis present

## 2021-04-15 DIAGNOSIS — I252 Old myocardial infarction: Secondary | ICD-10-CM | POA: Diagnosis not present

## 2021-04-15 DIAGNOSIS — I2510 Atherosclerotic heart disease of native coronary artery without angina pectoris: Secondary | ICD-10-CM | POA: Diagnosis present

## 2021-04-15 DIAGNOSIS — D62 Acute posthemorrhagic anemia: Secondary | ICD-10-CM | POA: Diagnosis present

## 2021-04-15 DIAGNOSIS — Z95828 Presence of other vascular implants and grafts: Secondary | ICD-10-CM | POA: Diagnosis not present

## 2021-04-15 DIAGNOSIS — N179 Acute kidney failure, unspecified: Secondary | ICD-10-CM | POA: Diagnosis present

## 2021-04-15 DIAGNOSIS — Z6837 Body mass index (BMI) 37.0-37.9, adult: Secondary | ICD-10-CM

## 2021-04-15 DIAGNOSIS — I2583 Coronary atherosclerosis due to lipid rich plaque: Secondary | ICD-10-CM

## 2021-04-15 LAB — BASIC METABOLIC PANEL
Anion gap: 10 (ref 5–15)
Anion gap: 8 (ref 5–15)
BUN: 24 mg/dL — ABNORMAL HIGH (ref 6–20)
BUN: 23 mg/dL — ABNORMAL HIGH (ref 6–20)
CO2: 28 mmol/L (ref 22–32)
CO2: 27 mmol/L (ref 22–32)
Calcium: 8.9 mg/dL (ref 8.9–10.3)
Calcium: 8.6 mg/dL — ABNORMAL LOW (ref 8.9–10.3)
Chloride: 100 mmol/L (ref 98–111)
Chloride: 103 mmol/L (ref 98–111)
Creatinine, Ser: 2.21 mg/dL — ABNORMAL HIGH (ref 0.44–1.00)
Creatinine, Ser: 2.27 mg/dL — ABNORMAL HIGH (ref 0.44–1.00)
GFR, Estimated: 25 mL/min — ABNORMAL LOW (ref >60)
GFR, Estimated: 24 mL/min — ABNORMAL LOW (ref >60)
Glucose, Bld: 156 mg/dL — ABNORMAL HIGH (ref 70–99)
Glucose, Bld: 109 mg/dL — ABNORMAL HIGH (ref 70–99)
Potassium: 4.5 mmol/L (ref 3.5–5.1)
Potassium: 4.7 mmol/L (ref 3.5–5.1)
Sodium: 138 mmol/L (ref 135–145)
Sodium: 138 mmol/L (ref 135–145)

## 2021-04-15 LAB — CBC
HCT: 26 % — ABNORMAL LOW (ref 36.0–46.0)
HCT: 26.5 % — ABNORMAL LOW (ref 36.0–46.0)
Hemoglobin: 8.1 g/dL — ABNORMAL LOW (ref 12.0–15.0)
Hemoglobin: 8.2 g/dL — ABNORMAL LOW (ref 12.0–15.0)
MCH: 31.4 pg (ref 26.0–34.0)
MCH: 31.2 pg (ref 26.0–34.0)
MCHC: 31.2 g/dL (ref 30.0–36.0)
MCHC: 30.9 g/dL (ref 30.0–36.0)
MCV: 100.8 fL — ABNORMAL HIGH (ref 80.0–100.0)
MCV: 100.8 fL — ABNORMAL HIGH (ref 80.0–100.0)
Platelets: 541 10*3/uL — ABNORMAL HIGH (ref 150–400)
Platelets: 541 10*3/uL — ABNORMAL HIGH (ref 150–400)
RBC: 2.58 MIL/uL — ABNORMAL LOW (ref 3.87–5.11)
RBC: 2.63 MIL/uL — ABNORMAL LOW (ref 3.87–5.11)
RDW: 17.3 % — ABNORMAL HIGH (ref 11.5–15.5)
RDW: 17.2 % — ABNORMAL HIGH (ref 11.5–15.5)
WBC: 11.9 10*3/uL — ABNORMAL HIGH (ref 4.0–10.5)
WBC: 12.2 10*3/uL — ABNORMAL HIGH (ref 4.0–10.5)
nRBC: 0 % (ref 0.0–0.2)
nRBC: 0.2 % (ref 0.0–0.2)

## 2021-04-15 LAB — GLUCOSE, CAPILLARY
Glucose-Capillary: 100 mg/dL — ABNORMAL HIGH (ref 70–99)
Glucose-Capillary: 108 mg/dL — ABNORMAL HIGH (ref 70–99)
Glucose-Capillary: 112 mg/dL — ABNORMAL HIGH (ref 70–99)
Glucose-Capillary: 115 mg/dL — ABNORMAL HIGH (ref 70–99)
Glucose-Capillary: 96 mg/dL (ref 70–99)

## 2021-04-15 MED ORDER — FERROUS SULFATE 325 (65 FE) MG PO TABS: 325.0000 mg | ORAL_TABLET | Freq: Every day | ORAL | 3 refills | Status: DC

## 2021-04-15 MED ORDER — ASPIRIN 81 MG PO CHEW
324.0000 mg | CHEWABLE_TABLET | Freq: Every day | ORAL | Status: DC
  Administered 2021-04-16 – 2021-04-26 (×11): 324 mg via ORAL
  Filled 2021-04-15 (×13): qty 4

## 2021-04-15 MED ORDER — METOPROLOL TARTRATE 12.5 MG HALF TABLET
12.5000 mg | ORAL_TABLET | Freq: Two times a day (BID) | ORAL | Status: DC
  Administered 2021-04-15 – 2021-04-25 (×20): 12.5 mg via ORAL
  Filled 2021-04-15 (×20): qty 1

## 2021-04-15 MED ORDER — DIPHENHYDRAMINE HCL 12.5 MG/5ML PO ELIX: 12.5000 mg | ORAL_SOLUTION | Freq: Four times a day (QID) | ORAL | Status: DC | PRN

## 2021-04-15 MED ORDER — METHOCARBAMOL 500 MG PO TABS
500.0000 mg | ORAL_TABLET | Freq: Four times a day (QID) | ORAL | Status: DC | PRN
  Administered 2021-04-16 – 2021-04-25 (×13): 500 mg via ORAL
  Filled 2021-04-15 (×14): qty 1

## 2021-04-15 MED ORDER — BISACODYL 10 MG RE SUPP: 10.0000 mg | Freq: Every day | RECTAL | Status: DC | PRN

## 2021-04-15 MED ORDER — ENOXAPARIN SODIUM 40 MG/0.4ML IJ SOSY: 40.0000 mg | PREFILLED_SYRINGE | INTRAMUSCULAR | Status: DC

## 2021-04-15 MED ORDER — FLEET ENEMA 7-19 GM/118ML RE ENEM: 1.0000 | ENEMA | Freq: Once | RECTAL | Status: DC | PRN

## 2021-04-15 MED ORDER — FUROSEMIDE 20 MG PO TABS: 20.0000 mg | ORAL_TABLET | ORAL | Status: DC

## 2021-04-15 MED ORDER — ACETAMINOPHEN 325 MG PO TABS
325.0000 mg | ORAL_TABLET | ORAL | Status: DC | PRN
  Administered 2021-04-19 (×2): 650 mg via ORAL

## 2021-04-15 MED ORDER — AMIODARONE HCL 200 MG PO TABS: 200.0000 mg | ORAL_TABLET | Freq: Every day | ORAL | 0 refills | Status: DC

## 2021-04-15 MED ORDER — PROCHLORPERAZINE MALEATE 5 MG PO TABS: 5.0000 mg | ORAL_TABLET | Freq: Four times a day (QID) | ORAL | Status: DC | PRN

## 2021-04-15 MED ORDER — FERROUS SULFATE 325 (65 FE) MG PO TABS
325.0000 mg | ORAL_TABLET | Freq: Every day | ORAL | Status: DC
  Administered 2021-04-16 – 2021-04-26 (×11): 325 mg via ORAL
  Filled 2021-04-15 (×11): qty 1

## 2021-04-15 MED ORDER — GUAIFENESIN-DM 100-10 MG/5ML PO SYRP: 5.0000 mL | ORAL_SOLUTION | Freq: Four times a day (QID) | ORAL | Status: DC | PRN

## 2021-04-15 MED ORDER — FERROUS SULFATE 325 (65 FE) MG PO TABS
325.0000 mg | ORAL_TABLET | Freq: Every day | ORAL | Status: DC
  Administered 2021-04-15: 325 mg via ORAL
  Filled 2021-04-15: qty 1

## 2021-04-15 MED ORDER — AMIODARONE HCL 200 MG PO TABS: 200.0000 mg | ORAL_TABLET | Freq: Two times a day (BID) | ORAL | 0 refills | Status: DC

## 2021-04-15 MED ORDER — OXYCODONE HCL 5 MG PO TABS
5.0000 mg | ORAL_TABLET | ORAL | Status: DC | PRN
  Administered 2021-04-15 – 2021-04-20 (×8): 10 mg via ORAL
  Administered 2021-04-21: 5 mg via ORAL
  Administered 2021-04-21 – 2021-04-24 (×9): 10 mg via ORAL
  Administered 2021-04-25: 5 mg via ORAL
  Administered 2021-04-25 – 2021-04-26 (×2): 10 mg via ORAL
  Filled 2021-04-15 (×11): qty 2
  Filled 2021-04-15: qty 1
  Filled 2021-04-15 (×7): qty 2
  Filled 2021-04-15: qty 1
  Filled 2021-04-15 (×2): qty 2

## 2021-04-15 MED ORDER — ACETAMINOPHEN 325 MG PO TABS
650.0000 mg | ORAL_TABLET | Freq: Four times a day (QID) | ORAL | Status: DC
  Administered 2021-04-15 – 2021-04-25 (×38): 650 mg via ORAL
  Filled 2021-04-15 (×39): qty 2

## 2021-04-15 MED ORDER — PROCHLORPERAZINE EDISYLATE 10 MG/2ML IJ SOLN: 5.0000 mg | Freq: Four times a day (QID) | INTRAMUSCULAR | Status: DC | PRN

## 2021-04-15 MED ORDER — AMIODARONE HCL 200 MG PO TABS
200.0000 mg | ORAL_TABLET | Freq: Every day | ORAL | Status: DC
Start: 2021-04-16 — End: 2021-04-26
  Administered 2021-04-16 – 2021-04-26 (×11): 200 mg via ORAL
  Filled 2021-04-15 (×11): qty 1

## 2021-04-15 MED ORDER — ORAL CARE MOUTH RINSE
15.0000 mL | Freq: Two times a day (BID) | OROMUCOSAL | Status: DC
  Administered 2021-04-17 – 2021-04-25 (×10): 15 mL via OROMUCOSAL

## 2021-04-15 MED ORDER — ALUM & MAG HYDROXIDE-SIMETH 200-200-20 MG/5ML PO SUSP: 30.0000 mL | ORAL | Status: DC | PRN

## 2021-04-15 MED ORDER — INSULIN ASPART 100 UNIT/ML IJ SOLN
0.0000 [IU] | Freq: Four times a day (QID) | INTRAMUSCULAR | Status: DC
  Administered 2021-04-18 – 2021-04-20 (×4): 2 [IU] via SUBCUTANEOUS

## 2021-04-15 MED ORDER — TRAZODONE HCL 50 MG PO TABS
25.0000 mg | ORAL_TABLET | Freq: Every evening | ORAL | Status: DC | PRN
  Administered 2021-04-16 – 2021-04-22 (×5): 50 mg via ORAL
  Filled 2021-04-15 (×5): qty 1

## 2021-04-15 MED ORDER — ADULT MULTIVITAMIN W/MINERALS CH
1.0000 | ORAL_TABLET | Freq: Every day | ORAL | Status: DC
Start: 2021-04-16 — End: 2021-04-26
  Administered 2021-04-16 – 2021-04-26 (×11): 1 via ORAL
  Filled 2021-04-15 (×12): qty 1

## 2021-04-15 MED ORDER — POLYETHYLENE GLYCOL 3350 17 G PO PACK: 17.0000 g | PACK | Freq: Every day | ORAL | Status: DC | PRN

## 2021-04-15 MED ORDER — PROCHLORPERAZINE 25 MG RE SUPP: 12.5000 mg | Freq: Four times a day (QID) | RECTAL | Status: DC | PRN

## 2021-04-15 MED ORDER — PANTOPRAZOLE SODIUM 40 MG PO TBEC
40.0000 mg | DELAYED_RELEASE_TABLET | Freq: Every day | ORAL | Status: DC
  Administered 2021-04-16 – 2021-04-26 (×11): 40 mg via ORAL
  Filled 2021-04-15 (×11): qty 1

## 2021-04-15 MED ORDER — HEPARIN SODIUM (PORCINE) 5000 UNIT/ML IJ SOLN
5000.0000 [IU] | Freq: Three times a day (TID) | INTRAMUSCULAR | Status: DC
  Administered 2021-04-15 – 2021-04-26 (×32): 5000 [IU] via SUBCUTANEOUS
  Filled 2021-04-15 (×32): qty 1

## 2021-04-15 MED ORDER — IPRATROPIUM-ALBUTEROL 0.5-2.5 (3) MG/3ML IN SOLN: 3.0000 mL | RESPIRATORY_TRACT | Status: DC | PRN

## 2021-04-15 NOTE — Progress Notes (Signed)
Occupational Therapy Treatment Patient Details Name: Dorothy Harris MRN: [id number] DOB: Apr 16, 1960 Today's Date: 04/15/2021   History of present illness 61 y.o. female admitted 03/24/21 with chest pain. 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 PMH includes CAD, NSTEMI, HTN, tobacco use, prior thoracic aorta dissection for which pt did not follow up with vascular sx.   OT comments  Pt progressing towards established OT goals. Continues to present with decreased activity tolerance, strength, and balance impacting her functional performance. Pt also requiring Mod cues for maintaining sternal precautions. Pt performing functional mobility with Min A +2 and RW. Requiring seated rest breaks for fatigue. However, motivated despite fatigue to continue activity and participate in therapy. Continue to recommend dc to AIR for intensive OT and will continue to follow acutely as admitted. Updated goals.    Recommendations for follow up therapy are one component of a multi-disciplinary discharge planning process, led by the attending physician.  Recommendations may be updated based on patient status, additional functional criteria and insurance authorization.    Follow Up Recommendations  Acute inpatient rehab (3hours/day)    Assistance Recommended at Discharge Frequent or constant Supervision/Assistance  Patient can return home with the following  A little help with walking and/or transfers;A little help with bathing/dressing/bathroom;Assistance with cooking/housework   Equipment Recommendations  Other (comment) (Defer to netx venue)    Recommendations for Other Services      Precautions / Restrictions Precautions Precautions:  Fall;Sternal Precaution Booklet Issued: Yes (comment) Precaution Comments: monitor O2, HR; abdominal wound vac, colostomy Restrictions Other Position/Activity Restrictions: sternal precautions       Mobility Bed Mobility Overal bed mobility: Needs Assistance Bed Mobility: Rolling;Sidelying to Sit;Sit to Sidelying Rolling: Min guard Sidelying to sit: Min guard     Sit to sidelying: Min guard General bed mobility comments: min guard A for safety. cues for maintaining sternal precautions    Transfers Overall transfer level: Needs assistance Equipment used: Rolling walker (2 wheels) Transfers: Sit to/from Stand Sit to Stand: Min assist;+2 physical assistance           General transfer comment: Min A +2 for power up into standing. cues for hand positioning to maintain sternal precautions.     Balance Overall balance assessment: Needs assistance Sitting-balance support: No upper extremity supported;Feet supported Sitting balance-Leahy Scale: Fair     Standing balance support: Bilateral upper extremity supported;During functional activity Standing balance-Leahy Scale: Poor                             ADL either performed or assessed with clinical judgement   ADL Overall ADL's : Needs assistance/impaired       Grooming Details (indicate cue type and reason): Declined grooming at sink at this time due to [place] Body Dressing Details (indicate cue type and reason): Pt with difficulty bringing ankles to needs and may need AE for LB ADLs Toilet Transfer: Minimal assistance;+2 for physical assistance;+2 for safety/equipment;Ambulation;Rolling walker (2 wheels) (simulated to recliner) Toilet Transfer Details (indicate cue type and reason): Min A +2 for power up into standing. cues for hand positioning to maintain sternal precautions.  Functional mobility during ADLs: Minimal assistance;+2 for safety/equipment;Rolling walker (2  wheels)      Extremity/Trunk Assessment Upper Extremity Assessment Upper Extremity Assessment: Generalized weakness   Lower Extremity Assessment Lower Extremity Assessment: Defer to PT evaluation        Vision       Perception     Praxis      Cognition Arousal/Alertness: Awake/alert Behavior During Therapy: WFL for tasks assessed/performed;Flat affect Overall Cognitive Status: Within Functional Limits for tasks assessed                                            Exercises Exercises: Other exercises Other Exercises Other Exercises: Is. pulling ~750. performing x3.   Shoulder Instructions       General Comments SpO2 and HR stable on RA. Small gap in colostomy patch - notified nursing.    Pertinent Vitals/ Pain       Pain Assessment: No/denies pain  Home Living                                          Prior Functioning/Environment              Frequency  Min 2X/week        Progress Toward Goals  OT Goals(current goals can now be found in the care plan section)  Progress towards OT goals: Progressing toward goals  Acute Rehab OT Goals OT Goal Formulation: With patient Time For Goal Achievement: 04/29/21 Potential to Achieve Goals: Good ADL Goals Pt Will Perform Grooming: with supervision;standing Pt Will Perform Upper Body Bathing: with supervision;sitting;with set-up Pt Will Perform Lower Body Bathing: with supervision;with set-up;with adaptive equipment;sit to/from stand Pt Will Perform Upper Body Dressing: with set-up;with supervision;sitting Pt Will Perform Lower Body Dressing: with set-up;with supervision;sit to/from stand;with adaptive equipment Pt Will Transfer to Toilet: with set-up;with supervision;ambulating;regular height toilet Pt Will Perform Toileting - Clothing Manipulation and hygiene: with set-up;with supervision;sitting/lateral leans Additional ADL Goal #1: Pt will maintain sternal precautions  during ADLs 1-2 verbal cues  Plan Discharge plan remains appropriate    Co-evaluation    PT/OT/SLP Co-Evaluation/Treatment: Yes Reason for Co-Treatment: For patient/therapist safety;To address functional/ADL transfers   OT goals addressed during session: ADL's and self-care      AM-PAC OT "6 Clicks" Daily Activity     Outcome Measure   Help from another person eating meals?: A Little Help from another person taking care of personal grooming?: A Little Help from another person toileting, which includes using toliet, bedpan, or urinal?: A Lot Help from another person bathing (including washing, rinsing, drying)?: A Lot Help from another person to put on and taking off regular upper body clothing?: A Lot Help from another person to put on and taking off regular lower body clothing?: A Lot 6 Click Score: 14    End of Session Equipment Utilized During Treatment: Rolling walker (2 wheels)  OT Visit Diagnosis: Unsteadiness on feet (R26.81);Other abnormalities of gait and mobility (R26.89);Muscle weakness (generalized) (M62.81);Pain   Activity Tolerance Patient tolerated treatment well   Patient Left in bed;with call bell/phone within reach   Nurse Communication Mobility status        Time: 3532-9924 OT Time Calculation (min): 24 min  Charges: OT General Charges $OT Visit: 1 Visit OT  Treatments $Therapeutic Activity: 8-22 mins  [place], OTR/L Acute Rehab Pager: [phone] Office: [place] 04/15/2021, 8:45 AM

## 2021-04-15 NOTE — Progress Notes (Addendum)
Inpatient Rehab Admissions Coordinator:    I received insurance auth and can offer pt. A CIR bed today. MD to place d/c orders. RN may call report to [phone].   Clemens Catholic, [place], [place] Admissions Coordinator  [phone] ([place]) [phone] (office)

## 2021-04-15 NOTE — Progress Notes (Signed)
Inpatient Rehabilitation  Patient information reviewed and entered into eRehab system by Tareka Jhaveri M. Sheryn Aldaz, M.A., CCC/SLP, PPS Coordinator.  Information including medical coding, functional ability and quality indicators will be reviewed and updated through discharge.    

## 2021-04-15 NOTE — Progress Notes (Signed)
13 Days Post-Op   Subjective/Chief Complaint: No new complaints.  Doing well today.  Eating well   Objective: Vital signs in last 24 hours: Temp:  [98 F (36.7 C)-98.7 F (37.1 C)] 98 F (36.7 C) (01/09 0350) Pulse Rate:  [58-77] 66 (01/09 0350) Resp:  [11-18] 18 (01/09 0350) BP: (119-132)/(74-87) 132/80 (01/09 0350) SpO2:  [89 %-96 %] 96 % (01/09 0350) Last BM Date: 04/14/21  Intake/Output from previous day: 01/08 0701 - 01/09 0700 In: 720 [P.O.:720] Out: 1790 [Urine:950; Drains:140; Stool:700] Intake/Output this shift: No intake/output data recorded.  Exam: Abd: soft, appropriately tender, wound is overall health, small amount of dark adipose tissue at the inferior aspect.  Ileostomy working well, stoma is pink    Lab Results:  Recent Labs    04/13/21 0146 04/14/21 0140  WBC 15.7* 14.3*  HGB 7.8* 7.6*  HCT 24.0* 23.8*  PLT 423* 483*   BMET Recent Labs    04/13/21 0146 04/14/21 0140  NA 140 139  K 4.6 5.0  CL 102 103  CO2 29 27  GLUCOSE 122* 105*  BUN 37* 29*  CREATININE 2.30* 2.15*  CALCIUM 8.4* 8.3*   PT/INR No results for input(s): LABPROT, INR in the last 72 hours. ABG No results for input(s): PHART, HCO3 in the last 72 hours.  Invalid input(s): PCO2, PO2   Studies/Results: No results found.  Anti-infectives: Anti-infectives (From admission, onward)    Start     Dose/Rate Route Frequency Ordered Stop   04/08/21 1800  vancomycin (VANCOREADY) IVPB 1500 mg/300 mL  Status:  Discontinued        1,500 mg 150 mL/hr over 120 Minutes Intravenous Every 48 hours 04/08/21 0751 04/09/21 1454   04/06/21 2200  fluconazole (DIFLUCAN) tablet 200 mg  Status:  Discontinued        200 mg Oral Daily at bedtime 04/06/21 1232 04/09/21 1454   04/04/21 1800  vancomycin (VANCOREADY) IVPB 1250 mg/250 mL  Status:  Discontinued        1,250 mg 166.7 mL/hr over 90 Minutes Intravenous Every 48 hours 04/03/21 1501 04/08/21 0751   04/03/21 0600   piperacillin-tazobactam (ZOSYN) IVPB 3.375 g        3.375 g 12.5 mL/hr over 240 Minutes Intravenous On call to O.R. 04/02/21 1603 04/03/21 0916   04/03/21 0200  piperacillin-tazobactam (ZOSYN) IVPB 3.375 g  Status:  Discontinued        3.375 g 12.5 mL/hr over 240 Minutes Intravenous Every 8 hours 04/02/21 2143 04/09/21 1454   04/02/21 2245  fluconazole (DIFLUCAN) IVPB 200 mg  Status:  Discontinued        200 mg 100 mL/hr over 60 Minutes Intravenous Every 24 hours 04/02/21 2147 04/06/21 1231   04/02/21 2245  vancomycin (VANCOREADY) IVPB 2000 mg/400 mL        2,000 mg 200 mL/hr over 120 Minutes Intravenous  Once 04/02/21 2158 04/03/21 0142   04/02/21 2158  vancomycin variable dose per unstable renal function (pharmacist dosing)  Status:  Discontinued         Does not apply See admin instructions 04/02/21 2159 04/03/21 1501   03/25/21 1200  vancomycin (VANCOCIN) IVPB 1000 mg/200 mL premix        1,000 mg 200 mL/hr over 60 Minutes Intravenous  Once 03/25/21 0512 03/25/21 1206   03/25/21 0600  ceFAZolin (ANCEF) IVPB 2g/100 mL premix        2 g 200 mL/hr over 30 Minutes Intravenous Every 8 hours 03/25/21 0512 03/26/21 2217  03/24/21 1800  ceFAZolin (ANCEF) IVPB 2g/100 mL premix        2 g 200 mL/hr over 30 Minutes Intravenous To Surgery 03/24/21 1738 03/25/21 0308   03/24/21 1800  ceFAZolin (ANCEF) IVPB 2g/100 mL premix  Status:  Discontinued        2 g 200 mL/hr over 30 Minutes Intravenous To Surgery 03/24/21 1734 03/25/21 0512   03/24/21 1800  vancomycin (VANCOREADY) IVPB 1250 mg/250 mL        1,250 mg 166.7 mL/hr over 90 Minutes Intravenous To Surgery 03/24/21 1738 03/24/21 2114       Assessment/Plan: Chronically incarcerated ventral hernia Bowel perforation -POD #13 s/p ex lap, adhesiolysis, extended R hemicolectomy, primary incisional hernia repair, ileostomy creation, abdominal washout, drain placement x2, incisional wound vac placement 30x8x4cm - Dr. Bobbye Morton 04/02/21 -tolerating  HH diet and cont Glucerna -TNA off -working with [place] on ileostomy care -midline wound with VAC, cont on MWF schedule -mobilize -DC RLQ JP drain 1/4, DC LLQ JP on 1/5 -multimodal pain control working well -WBC downtrending to 14 -surgically stable at this time for DC to rehab when felt medically stable   FEN - HH diet, Glucerna VTE - heparin, ID - Vanc/zosyn - stopped 1/3  ABL anemia - hgb 7.6 today S/p ascending aortic replacement Acute on chronic kidney disease A fib obesity   Dorothy Cea PA-C 04/15/2021

## 2021-04-15 NOTE — Progress Notes (Signed)
Inpatient Rehabilitation Admission Medication Review by a Pharmacist   A complete drug regimen review was completed for this patient to identify any potential clinically significant medication issues.   High Risk Drug Classes Is patient taking? Indication by Medication  Antipsychotic No    Anticoagulant No    Antibiotic No   Opioid Yes Oxycodone/apap PRN moderate pain  Antiplatelet Yes Aspirin po $RemoveBe'324mg'[id number]$  daily   Hypoglycemics/insulin Yes SSI  Vasoactive Medication No Metoprolol for HTN  Chemotherapy No    Other No          Type of Medication Issue Identified Description of Issue Recommendation(s)  Drug Interaction(s) (clinically significant)        Duplicate Therapy        Allergy        No Medication Administration End Date        Incorrect Dose        Additional Drug Therapy Needed      Significant med changes from prior encounter (inform family/care partners about these prior to discharge).      Other            Clinically significant medication issues were identified that warrant physician communication and completion of prescribed/recommended actions by midnight of the next day:  No   Time spent performing this drug regimen review (minutes):  15 min   Donna Christen Jamae Tison, PharmD, [facility] Clinical Pharmacist

## 2021-04-15 NOTE — PMR Pre-admission (Signed)
PMR Admission Coordinator Pre-Admission Assessment  Patient: Dorothy Harris is an 61 y.o., female MRN: [id number] DOB: [birth year] Height: 5\' 7"  (170.2 cm) Weight: 109.8 kg  Insurance Information HMO: yes    PPO:      PCP:      IPA:      80/20:      OTHER:  PRIMARY: [employer]      Policy#: [id number],      Subscriber: Pt.  CM Name: Stanton Kidney       Phone#: [phone]     Fax#: [phone] Pre-Cert#: [id number]      Employer:  Benefits:  Phone #: [phone]     Name: Flonnie Hailstone Date: 04/07/2021 - still active Deductible: $9,100 ($0 met) OOP Max: $9,100 ($0 met) CIR: 100% coverage after deductible SNF: 100% coverage after deductible; limited to 60 days/cal yr OP: 100% coverage after deductible;  limited to 30 visits combined/cal yr HH: 100% coverage after deductible; prior auth required DME: 100% coverage after deductible; prior auth required  SECONDARY:       Policy#:      Phone#:  Development worker, community:       Phone#:   The [employer] for patients in Inpatient Rehabilitation Facilities with attached Privacy Act [place] Records was provided and verbally reviewed with: Patient  Emergency Contact Information Contact Information     Name Relation Home Work Mobile   [place] [phone]     Juluis Mire [phone]         Current Medical History  Patient Admitting Diagnosis: Debility, Periotonitis History of Present Illness: Dorothy Harris is a 61 year old right-handed female with history of hypertension, CKD stage III, obesity with BMI 37.91, tobacco use, hyperlipidemia, CAD/non-STEMI maintained on aspirin, 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  She had a repeat CTA of the chest March 2019 showed stable aneurysmal dilation of the proximal descending aorta at 4.4 cm and she had a repeat CT of the chest July 2019 showed stable aortic  dissection.  The ascending aorta was unremarkable.  She was subsequently lost to follow-up.   Per chart review lives alone independent prior to admission with sedentary lifestyle.  1 level home with 2 steps to entry.  Presented [facility]/24/[facility] to [facility] with nonspecific substernal chest pain.  Denied any dizziness or headache.  Patient did receive 2 nitros.  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  The scan was repeated with intravenous contrast and clearly showed acute type A dissection beginning in aortic root and extending up into the aortic arch to meet up with previous type B dissection.  It did not appear to extend up into the arch vessels themselves.  Patient was transferred to [facility] under went median sternotomy with replacement of aortic arch under deep hypothermic circulatory arrest with bypass of the innominate, left common carotid and left subclavian arteries/supra coronary replacement of the ascending aorta 03/25/2021 per Dr. Caffie Pinto.  She did require 3 units packed red blood cells 2 units platelets during surgery and 4 FFP.  She was extubated 03/26/2021.  Hospital course she subsequently developed abdominal pain needing to to the operating room 12/27.  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  Extended right hemicolectomy and ileostomy performed per Dr.Lovick and wound VAC applied.  She did require  TPN for short time.  Acute blood loss anemia multiple packed red blood cells, cryo, FFP platelet transfusion with latest hemoglobin 7.8.  A. fib/RVR 12/31 intraoperative echocardiogram with ejection fraction 65% she did require amiodarone and was not a candidate for anticoagulation.  Acute on chronic diastolic congestive heart failure complicated by acute hypoxic respiratory failure she was weaned from oxygen therapy with  diuresis as directed.  AKI 1.83-2.30 with Lasix therapy adjusted.  She was cleared to begin subcutaneous heparin for DVT prophylaxis 04/10/2021.  Her diet has been advanced to regular consistency. Complete NIHSS TOTAL: 0  Patient's medical record from [facility] has been reviewed by the rehabilitation admission coordinator and physician.  Past Medical History  Past Medical History:  Diagnosis Date   Abdominal aneurysm    Hernia, epigastric    Hypertension     Has the patient had major surgery during 100 days prior to admission? Yes  Family History   family history includes Breast cancer in her maternal aunt; Diabetes in her father, maternal aunt, and sister; Hypertension in her mother, sister, and sister; Kidney disease in her sister; Stroke in her sister.  Current Medications  Current Facility-Administered Medications:    acetaminophen (TYLENOL) tablet 1,000 mg, 1,000 mg, Oral, Q6H, Saverio Danker, PA-C, 1,000 mg at 04/15/21 1346   amiodarone (PACERONE) tablet 200 mg, 200 mg, Oral, Daily, Lars Pinks M, PA-C, 200 mg at 04/15/21 3474   aspirin chewable tablet 324 mg, 324 mg, Oral, Daily, Gaye Pollack, MD, 324 mg at 04/15/21 0855   chlorhexidine (PERIDEX) 0.12 % solution 15 mL, 15 mL, Mouth Rinse, BID, Candee Furbish, MD, 15 mL at 04/15/21 0857   Chlorhexidine Gluconate Cloth 2 % PADS 6 each, 6 each, Topical, Daily, Gaye Pollack, MD, 6 each at 04/15/21 0858   feeding supplement (GLUCERNA SHAKE) (GLUCERNA SHAKE) liquid 237 mL, 237 mL, Oral, TID BM, Sharen Hones, MD, 237 mL at 04/14/21 1948   ferrous sulfate tablet 325 mg, 325 mg, Oral, Q breakfast, Regalado, Belkys A, MD, 325 mg at 04/15/21 0855   heparin injection 5,000 Units, 5,000 Units, Subcutaneous, Q8H, Saverio Danker, PA-C, 5,000 Units at 04/15/21 1346   hydrALAZINE (APRESOLINE) injection 20 mg, 20 mg, Intravenous, Q4H PRN, Agarwala, Einar Grad, MD, 20 mg at 04/08/21 0050   insulin aspart (novoLOG)  injection 0-24 Units, 0-24 Units, Subcutaneous, Q6H, Donnamae Jude, RPH, 2 Units at 04/14/21 1248   ipratropium-albuterol (DUONEB) 0.5-2.5 (3) MG/3ML nebulizer solution 3 mL, 3 mL, Nebulization, Q4H PRN, Sharen Hones, MD   LORazepam (ATIVAN) injection 0.5 mg, 0.5 mg, Intravenous, Q6H PRN, Howerter, Justin B, DO, 0.5 mg at 04/09/21 2020   MEDLINE mouth rinse, 15 mL, Mouth Rinse, q12n4p, Candee Furbish, MD, 15 mL at 04/12/21 1044   methocarbamol (ROBAXIN) tablet 500 mg, 500 mg, Oral, Q6H PRN, Saverio Danker, PA-C, 500 mg at 04/15/21 0918   metoprolol tartrate (LOPRESSOR) tablet 12.5 mg, 12.5 mg, Oral, BID, Florencia Reasons, MD, 12.5 mg at 04/15/21 2595   multivitamin with minerals tablet 1 tablet, 1 tablet, Oral, Daily, Sharen Hones, MD, 1 tablet at 04/15/21 0856   ondansetron (ZOFRAN) injection 4 mg, 4 mg, Intravenous, Q6H PRN, Barrett, Erin R, PA-C, 4 mg at 04/04/21 2318   oxyCODONE (Oxy IR/ROXICODONE) immediate release tablet 5-10 mg, 5-10 mg, Oral, Q4H PRN, Saverio Danker, PA-C, 10 mg at 04/15/21 1346   pantoprazole (PROTONIX) EC tablet 40 mg, 40 mg, Oral, Daily, Gaye Pollack, MD, 40 mg at 04/15/21 [phone]  sodium chloride flush (NS) 0.9 % injection 10-40 mL, 10-40 mL, Intracatheter, Q12H, Bartle, Bryan K, MD, 10 mL at 04/15/21 1132   sodium chloride flush (NS) 0.9 % injection 10-40 mL, 10-40 mL, Intracatheter, PRN, Gaye Pollack, MD, 10 mL at 04/14/21 4782  Patients Current Diet:  Diet Order             Diet Heart Room service appropriate? Yes with Assist; Fluid consistency: Thin  Diet effective now           Diet - low sodium heart healthy                   Precautions / Restrictions Precautions Precautions: Fall, Sternal Precaution Booklet Issued: Yes (comment) Precaution Comments: monitor O2, HR; abdominal wound vac, colostomy Restrictions Weight Bearing Restrictions: No RUE Weight Bearing: Non weight bearing Other Position/Activity Restrictions: sternal precautions   Has the  patient had 2 or more falls or a fall with injury in the past year? No   Prior Activity Level Community (5-7x/wk): Went out daily.  worked [employer].   Prior Functional Level Self Care: Did the patient need help bathing, dressing, using the toilet or eating? Independent   Indoor Mobility: Did the patient need assistance with walking from room to room (with or without device)? Independent   Stairs: Did the patient need assistance with internal or external stairs (with or without device)? Independent   Functional Cognition: Did the patient need help planning regular tasks such as shopping or remembering to take medications? Independent   Patient Information Are you of Hispanic, Latino/a,or Spanish origin?: A. No, not of Hispanic, Latino/a, or Spanish origin What is your race?: B. Black or African American Do you need or want an interpreter to communicate with a doctor or health care staff?: 0. No   Patient's Response To:  Health Literacy and Transportation Is the patient able to respond to health literacy and transportation needs?: Yes Health Literacy - How often do you need to have someone help you when you read instructions, pamphlets, or other written material from your doctor or pharmacy?: Sometimes In the past 12 months, has lack of transportation kept you from medical appointments or from getting medications?: No In the past 12 months, has lack of transportation kept you from meetings, work, or from getting things needed for daily living?: No   Development worker, international aid / [place] Devices/Equipment: None Home Equipment: Shower seat   Prior Device Use: Indicate devices/aids used by the patient prior to current illness, exacerbation or injury? None of the above  Development worker, international aid / [place] Devices/Equipment: None Home Equipment: Shower seat  Prior Device Use: Indicate devices/aids used by the patient prior to current illness, exacerbation or injury?  None of the above  Current Functional Level Cognition  Overall Cognitive Status: Within Functional Limits for tasks assessed Orientation Level: Oriented X4 Following Commands: Follows one step commands with increased time General Comments: pleasant, follows directions well, appropriate responses    Extremity Assessment (includes Sensation/Coordination)  Upper Extremity Assessment: Generalized weakness LUE Deficits / Details: reports feels stiff and asking for a squeeze ball (provided her one)  Lower Extremity Assessment: Defer to PT evaluation    ADLs  Overall ADL's : Needs assistance/impaired Eating/Feeding: Set up, Sitting, Bed level Eating/Feeding Details (indicate cue type and reason): sat up on EOB to eat a bowl of rice krispies then her true breakfast arrived and she ate this while in bed Grooming: Wash/dry hands, Set  up, Bed level Grooming Details (indicate cue type and reason): Declined grooming at sink at this time due to fatgiue Upper Body Bathing: Moderate assistance, Sitting Upper Body Bathing Details (indicate cue type and reason): in recliner Lower Body Bathing: Total assistance Lower Body Bathing Details (indicate cue type and reason): Min A +2 sit<>stand Upper Body Dressing : Maximal assistance, Sitting Upper Body Dressing Details (indicate cue type and reason): in recliner Lower Body Dressing: Total assistance, Bed level Lower Body Dressing Details (indicate cue type and reason): Pt with difficulty bringing ankles to needs and may need AE for LB ADLs Toilet Transfer: Minimal assistance, +2 for physical assistance, +2 for safety/equipment, Ambulation, Rolling walker (2 wheels) (simulated to recliner) Toilet Transfer Details (indicate cue type and reason): Min A +2 for power up into standing. cues for hand positioning to maintain sternal precautions. Toileting- Clothing Manipulation and Hygiene: Total assistance Toileting - Clothing Manipulation Details (indicate cue  type and reason): min A +2 sit<>stand Functional mobility during ADLs: Minimal assistance, +2 for safety/equipment, Rolling walker (2 wheels) General ADL Comments: Focus on progression of mobility to sink, declined need for ADLs this AM (already completed)    Mobility  Overal bed mobility: Needs Assistance Bed Mobility: Rolling, Sidelying to Sit, Sit to Sidelying Rolling: Min guard Sidelying to sit: Min guard Supine to sit: Mod assist Sit to sidelying: Min guard General bed mobility comments: min guard A for safety. cues for maintaining sternal precautions    Transfers  Overall transfer level: Needs assistance Equipment used: Rolling walker (2 wheels) Transfers: Sit to/from Stand Sit to Stand: Min assist, +2 physical assistance General transfer comment: Min A +2 for power up into standing. cues for hand positioning to maintain sternal precautions.    Ambulation / Gait / Stairs / Wheelchair Mobility  Ambulation/Gait Ambulation/Gait assistance: Counsellor (Feet): 45 Feet (+ 45 ft) Assistive device: Rolling walker (2 wheels) Gait Pattern/deviations: Step-through pattern, Decreased stride length, Trunk flexed General Gait Details: pt with small strides and clearance, dependent on UE support. reports fatigue needing seated rest after 40 ft Gait velocity: .3 m/s Gait velocity interpretation: <1.31 ft/sec, indicative of household ambulator    Posture / Balance Dynamic Sitting Balance Sitting balance - Comments: sat EOB without UE support Balance Overall balance assessment: Needs assistance Sitting-balance support: No upper extremity supported, Feet supported Sitting balance-Leahy Scale: Fair Sitting balance - Comments: sat EOB without UE support Standing balance support: Bilateral upper extremity supported, During functional activity Standing balance-Leahy Scale: Poor Standing balance comment: dependent on BUE support and external assist    Special needs/care  consideration Skin Skin Post op Chest and abdominal incisions/chest incisions with ileostomy, wound vac   Previous Home Environment (from acute therapy documentation) Living Arrangements: Alone Available Help at Discharge: Friend(s), Available PRN/intermittently, Other (Comment) Type of Home: House Home Layout: One level Home Access: Stairs to enter Entrance Stairs-Rails:  (pt reports she can't remember) Entrance Stairs-Number of Steps: 2 Bathroom Shower/Tub: Chiropodist: Standard Home Care Services: No Additional Comments: pt states unsure of rail at home or assist available, otherwise not using any DME prior to arrival  Discharge Living Setting Plans for Discharge Living Setting: Alone, House (Lives alone) Type of Home at Discharge: House Discharge Home Layout: One level Discharge Home Access: Stairs to enter Entrance Stairs-Rails: Right Entrance Stairs-Number of Steps: 3 steps Discharge Bathroom Shower/Tub: Tub/shower unit, Door Discharge Bathroom Toilet: Standard Does the patient have any problems obtaining your medications?: No  Social/Family/Support Systems  Patient Roles: Other (Comment) (Has a sister in [place] and an aunt.) Contact Information: Delman Cheadle - sister - [phone] Anticipated Caregiver: Friends and neighbors Caregiver Availability: Intermittent Discharge Plan Discussed with Primary Caregiver: Yes Is Caregiver In Agreement with Plan?: Yes Does Caregiver/Family have Issues with Lodging/Transportation while Pt is in Rehab?: No  Goals Patient/Family Goal for Rehab: PT/OT mod I goals Expected length of stay: 5-7 days Pt/Family Agrees to Admission and willing to participate: Yes Program Orientation Provided & Reviewed with Pt/Caregiver Including Roles  & Responsibilities: Yes  Decrease burden of Care through IP rehab admission: Specialzed equipment needs  Possible need for SNF placement upon discharge: not anticipated   Patient  Condition: I have reviewed medical records from [facility], spoken with CM, and patient. I met with patient at the bedside for inpatient rehabilitation assessment.  Patient will benefit from ongoing PT and OT, can actively participate in 3 hours of therapy a day 5 days of the week, and can make measurable gains during the admission.  Patient will also benefit from the coordinated team approach during an Inpatient Acute Rehabilitation admission.  The patient will receive intensive therapy as well as Rehabilitation physician, nursing, social worker, and care management interventions.  Due to bladder management, bowel management, safety, skin/wound care, disease management, medication administration, pain management, and patient education the patient requires 24 hour a day rehabilitation nursing.  The patient is currently min A-min guard with mobility and basic ADLs.  Discharge setting and therapy post discharge at home with home health is anticipated.  Patient has agreed to participate in the Acute Inpatient Rehabilitation Program and will admit today.  Preadmission Screen Completed By:  Genella Mech, 04/15/2021 1:47 PM ______________________________________________________________________   Discussed status with Dr. Naaman Plummer  on 04/15/21 at 96 and received approval for admission today.  Admission Coordinator:  Genella Mech, CCC-SLP, time 1350/Date 04/15/21   Assessment/Plan: Diagnosis:debility s/p aortic aneurysm repair Does the need for close, 24 hr/day Medical supervision in concert with the patient's rehab needs make it unreasonable for this patient to be served in a less intensive setting? Yes Co-Morbidities requiring supervision/potential complications: s/p colectomy with ileostomy, CKD 3, morbid obesity Due to bladder management, bowel management, safety, skin/wound care, disease management, medication administration, pain management, and patient education, does the patient require  24 hr/day rehab nursing? Yes Does the patient require coordinated care of a physician, rehab nurse, PT, OT, and SLP to address physical and functional deficits in the context of the above medical diagnosis(es)? Yes Addressing deficits in the following areas: balance, endurance, locomotion, strength, transferring, bowel/bladder control, bathing, dressing, feeding, psychosocial support, and ileostomy education Can the patient actively participate in an intensive therapy program of at least 3 hrs of therapy 5 days a week? Yes The potential for patient to make measurable gains while on inpatient rehab is good Anticipated functional outcomes upon discharge from inpatient rehab: modified independent PT, modified independent OT, n/a SLP Estimated rehab length of stay to reach the above functional goals is: 5-7d Anticipated discharge destination: Home 10. Overall Rehab/Functional Prognosis: good   MD Signature: Charlett Blake M.D. [facility] Fellow Am Acad of Phys Med and Rehab Diplomate Am Board of Electrodiagnostic Med Fellow Am Board of Interventional Pain

## 2021-04-15 NOTE — Progress Notes (Signed)
Physical Therapy Treatment Patient Details Name: Dorothy Harris MRN: [id number] DOB: 03-Jul-1960 Today's Date: 04/15/2021   History of Present Illness 61 y.o. female admitted 03/24/21 with chest pain. 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 PMH includes CAD, NSTEMI, HTN, tobacco use, prior thoracic aorta dissection for which pt did not follow up with vascular sx.    PT Comments    The pt was agreeable to session with focus on progressing OOB mobility and endurance. The pt was able to progress hallway ambulation to short bouts of ~45 ft with use of RW and slowed gait speed. Due to onset of fatigue in BLE and SOB, the pt required seated rest (2-4 min) for recovery before continued ambulation. The pt continues to present with poor functional power and stability in BLE, and therefore benefits from cues for sternal precautions with transfers. Continue to recommend acute inpatient rehab at this time to address deficits in cardiovascular endurance, power, strength, and balance to allow for most successful return to independence and eventually home.   Gait Speed: 0.53m/s using RW and with minG (gait speed <0.8m/s indicates increased risk of falls and dependence in ADLs)    Recommendations for follow up therapy are one component of a multi-disciplinary discharge planning process, led by the attending physician.  Recommendations may be updated based on patient status, additional functional criteria and insurance authorization.  Follow Up Recommendations  Acute inpatient rehab (3hours/day)     Assistance Recommended at Discharge Frequent or constant Supervision/Assistance  Patient can return home with the following Two people to help with walking  and/or transfers;A lot of help with bathing/dressing/bathroom;Assistance with cooking/housework   Equipment Recommendations  Other (comment) (defer to post acute)    Recommendations for Other Services       Precautions / Restrictions Precautions Precautions: Fall;Sternal Precaution Booklet Issued: Yes (comment) Precaution Comments: monitor O2, HR; abdominal wound vac, colostomy Restrictions Weight Bearing Restrictions: No Other Position/Activity Restrictions: sternal precautions     Mobility  Bed Mobility Overal bed mobility: Needs Assistance Bed Mobility: Rolling;Sidelying to Sit;Sit to Sidelying Rolling: Min guard Sidelying to sit: Min guard     Sit to sidelying: Min guard General bed mobility comments: min guard A for safety. cues for maintaining sternal precautions    Transfers Overall transfer level: Needs assistance Equipment used: Rolling walker (2 wheels) Transfers: Sit to/from Stand Sit to Stand: Min assist;+2 physical assistance           General transfer comment: Min A +2 for power up into standing. cues for hand positioning to maintain sternal precautions.    Ambulation/Gait Ambulation/Gait assistance: Min guard Gait Distance (Feet): 45 Feet (+ 45 ft) Assistive device: Rolling walker (2 wheels) Gait Pattern/deviations: Step-through pattern;Decreased stride length;Trunk flexed Gait velocity: .3 m/s Gait velocity interpretation: <1.31 ft/sec, indicative of household ambulator   General Gait Details: pt with small strides and clearance, dependent on UE support. reports fatigue needing seated rest after 40 ft       Balance Overall balance assessment: Needs assistance Sitting-balance support: No upper extremity supported;Feet supported Sitting balance-Leahy Scale: Fair     Standing balance support: Bilateral upper extremity supported;During functional activity Standing balance-Leahy Scale: Poor  Cognition Arousal/Alertness: Awake/alert Behavior During Therapy: WFL for tasks assessed/performed;Flat affect Overall Cognitive Status: Within Functional Limits for tasks assessed                                          Exercises Other Exercises Other Exercises: IS. pulling ~750. performing x5    General Comments General comments (skin integrity, edema, etc.): SpO2 and HR stable on RA. Small gap in colostomy patch - notified nursing.      Pertinent Vitals/Pain Pain Assessment: No/denies pain Pain Intervention(s): Monitored during session     PT Goals (current goals can now be found in the care plan section) Acute Rehab PT Goals Patient Stated Goal: Return home PT Goal Formulation: With patient Time For Goal Achievement: 04/18/21 Potential to Achieve Goals: Good Progress towards PT goals: Progressing toward goals    Frequency    Min 3X/week      PT Plan Current plan remains appropriate    Co-evaluation PT/OT/SLP Co-Evaluation/Treatment: Yes Reason for Co-Treatment: To address functional/ADL transfers;For patient/therapist safety PT goals addressed during session: Mobility/safety with mobility;Balance;Proper use of DME;Strengthening/ROM OT goals addressed during session: ADL's and self-care      AM-PAC PT "6 Clicks" Mobility   Outcome Measure  Help needed turning from your back to your side while in a flat bed without using bedrails?: A Little Help needed moving from lying on your back to sitting on the side of a flat bed without using bedrails?: A Little Help needed moving to and from a bed to a chair (including a wheelchair)?: A Lot Help needed standing up from a chair using your arms (e.g., wheelchair or bedside chair)?: A Lot Help needed to walk in hospital room?: A Little Help needed climbing 3-5 steps with a railing? : Total 6 Click Score: 14    End of Session   Activity Tolerance: Patient tolerated treatment well Patient left: in  bed;with call bell/phone within reach Nurse Communication: Mobility status PT Visit Diagnosis: Other abnormalities of gait and mobility (R26.89);Muscle weakness (generalized) (M62.81)     Time: 5993-5701 PT Time Calculation (min) (ACUTE ONLY): 24 min  Charges:  $Therapeutic Exercise: 8-22 mins                     West Carbo, PT, DPT   Acute Rehabilitation Department Pager #: [phone]   Sandra Cockayne 04/15/2021, 9:10 AM

## 2021-04-15 NOTE — TOC Transition Note (Signed)
Transition of Care (TOC) - CM/SW Discharge Note Marvetta Gibbons RN, BSN Transitions of Care Unit 4E- RN Case Manager See Treatment Team for direct phone #    Patient Details  Name: TERYN BOEREMA MRN: [id number] Date of Birth: 1960/10/20  Transition of Care [facility]) CM/SW Contact:  Dawayne Patricia, RN Phone Number: 04/15/2021, 1:59 PM   Clinical Narrative:    Pt stable for transition to rehab- pt has insurance approval and CIR has notified that bed is available today for transition to [facility]. MD has placed d/c order and pt will transition later this afternoon to rehab.    Final next level of care: IP Rehab Facility Barriers to Discharge: Barriers Resolved   Patient Goals and CMS Choice Patient states their goals for this hospitalization and ongoing recovery are:: rehab CMS Medicare.gov Compare Post Acute Care list provided to:: Patient Choice offered to / list presented to : Patient  Discharge Placement               INPT rehab- [facility]        Discharge Plan and Services In-house Referral: Clinical Social Work Discharge Planning Services: CM Consult Post Acute Care Choice: IP Rehab          DME Arranged: N/A DME Agency: NA       HH Arranged: NA HH Agency: NA        Social Determinants of Health (SDOH) Interventions     Readmission Risk Interventions Readmission Risk Prevention Plan 04/15/2021  Transportation Screening (No Data)  PCP or Specialist Appt within 5-7 Days (No Data)  Home Care Screening Complete  Medication Review (RN CM) Complete  Some recent data might be hidden

## 2021-04-15 NOTE — Progress Notes (Addendum)
PMR Admission Coordinator Pre-Admission Assessment   Patient: Dorothy Harris is an 61 y.o., female MRN: [id number] DOB: 1960-06-12 Height: _0  (170.2 cm) Weight: 109.8 kg   Insurance Information HMO: yes    PPO:      PCP:      IPA:      80/20:      OTHER:  PRIMARY: [employer]      Policy#: [id number],      Subscriber: Pt.  CM Name: Stanton Kidney       Phone#: [phone]     Fax#: [phone] Pre-Cert#: [id number]      Employer:   I received a call from [facility] with [place] stating that Pt. was approved for admission 04/15/21-04/22/21 with updates due on 1/16 Benefits:  Phone #: [phone]     Name: Flonnie Hailstone Date: 04/07/2021 - still active Deductible: $9,100 ($0 met) OOP Max: $9,100 ($0 met) CIR: 100% coverage after deductible SNF: 100% coverage after deductible; limited to 60 days/cal yr OP: 100% coverage after deductible;  limited to 30 visits combined/cal yr HH: 100% coverage after deductible; prior auth required DME: 100% coverage after deductible; prior auth required   SECONDARY:       Policy#:      Phone#:  Development worker, community:       Phone#:    The [employer] for patients in Inpatient Rehabilitation Facilities with attached Privacy Act [place] Records was provided and verbally reviewed with: Patient   Emergency Contact Information Contact Information       Name Relation Home Work Mobile    [place] [phone]        Juluis Mire [phone]               Current Medical History  Patient Admitting Diagnosis: Debility, Periotonitis History of Present Illness: Dorothy Harris is a 61 year old right-handed female with history of hypertension, CKD stage III, obesity with BMI 37.91, tobacco use, hyperlipidemia, CAD/non-STEMI maintained on aspirin, 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  She had a repeat CTA of the chest March  2019 showed stable aneurysmal dilation of the proximal descending aorta at 4.4 cm and she had a repeat CT of the chest July 2019 showed stable aortic dissection.  The ascending aorta was unremarkable.  She was subsequently lost to follow-up.   Per chart review lives alone independent prior to admission with sedentary lifestyle.  1 level home with 2 steps to entry.  Presented 03/24/2021 to [facility] with nonspecific substernal chest pain.  Denied any dizziness or headache.  Patient did receive 2 nitros.  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  The scan was repeated with intravenous contrast and clearly showed acute type A dissection beginning in aortic root and extending up into the aortic arch to meet up with previous type B dissection.  It did not appear to extend up into the arch vessels themselves.  Patient was transferred to [facility] under went median sternotomy with replacement of aortic arch under deep hypothermic circulatory arrest with bypass of the innominate, left common carotid and left subclavian arteries/supra coronary replacement of the ascending aorta 03/25/2021 per Dr. Caffie Pinto.  She did require 3 units packed red blood cells 2 units platelets during surgery and 4 FFP.  She was extubated 03/26/2021.  Hospital course she subsequently developed  abdominal pain needing to to the operating room 12/27.  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  Extended right hemicolectomy and ileostomy performed per Dr.Lovick and wound VAC applied.  She did require TPN for short time.  Acute blood loss anemia multiple packed red blood cells, cryo, FFP platelet transfusion with latest hemoglobin 7.8.  A. fib/RVR 12/31 intraoperative echocardiogram with ejection fraction 65% she did require amiodarone and was not a candidate for  anticoagulation.  Acute on chronic diastolic congestive heart failure complicated by acute hypoxic respiratory failure she was weaned from oxygen therapy with diuresis as directed.  AKI 1.83-2.30 with Lasix therapy adjusted.  She was cleared to begin subcutaneous heparin for DVT prophylaxis 04/10/2021.  Her diet has been advanced to regular consistency. Complete NIHSS TOTAL: 0   Patient's medical record from [facility] has been reviewed by the rehabilitation admission coordinator and physician.   Past Medical History      Past Medical History:  Diagnosis Date   Abdominal aneurysm     Hernia, epigastric     Hypertension        Has the patient had major surgery during 100 days prior to admission? Yes   Family History   family history includes Breast cancer in her maternal aunt; Diabetes in her father, maternal aunt, and sister; Hypertension in her mother, sister, and sister; Kidney disease in her sister; Stroke in her sister.   Current Medications   Current Facility-Administered Medications:    acetaminophen (TYLENOL) tablet 1,000 mg, 1,000 mg, Oral, Q6H, Saverio Danker, PA-C, 1,000 mg at 04/15/21 1346   amiodarone (PACERONE) tablet 200 mg, 200 mg, Oral, Daily, Lars Pinks M, PA-C, 200 mg at 04/15/21 5009   aspirin chewable tablet 324 mg, 324 mg, Oral, Daily, Gaye Pollack, MD, 324 mg at 04/15/21 0855   chlorhexidine (PERIDEX) 0.12 % solution 15 mL, 15 mL, Mouth Rinse, BID, Candee Furbish, MD, 15 mL at 04/15/21 0857   Chlorhexidine Gluconate Cloth 2 % PADS 6 each, 6 each, Topical, Daily, Gaye Pollack, MD, 6 each at 04/15/21 0858   feeding supplement (GLUCERNA SHAKE) (GLUCERNA SHAKE) liquid 237 mL, 237 mL, Oral, TID BM, Sharen Hones, MD, 237 mL at 04/14/21 1948   ferrous sulfate tablet 325 mg, 325 mg, Oral, Q breakfast, Regalado, Belkys A, MD, 325 mg at 04/15/21 0855   heparin injection 5,000 Units, 5,000 Units, Subcutaneous, Q8H, Saverio Danker, PA-C, 5,000  Units at 04/15/21 1346   hydrALAZINE (APRESOLINE) injection 20 mg, 20 mg, Intravenous, Q4H PRN, Agarwala, Einar Grad, MD, 20 mg at 04/08/21 0050   insulin aspart (novoLOG) injection 0-24 Units, 0-24 Units, Subcutaneous, Q6H, Donnamae Jude, RPH, 2 Units at 04/14/21 1248   ipratropium-albuterol (DUONEB) 0.5-2.5 (3) MG/3ML nebulizer solution 3 mL, 3 mL, Nebulization, Q4H PRN, Sharen Hones, MD   LORazepam (ATIVAN) injection 0.5 mg, 0.5 mg, Intravenous, Q6H PRN, Howerter, Justin B, DO, 0.5 mg at 04/09/21 2020   MEDLINE mouth rinse, 15 mL, Mouth Rinse, q12n4p, Candee Furbish, MD, 15 mL at 04/12/21 1044   methocarbamol (ROBAXIN) tablet 500 mg, 500 mg, Oral, Q6H PRN, Saverio Danker, PA-C, 500 mg at 04/15/21 0918   metoprolol tartrate (LOPRESSOR) tablet 12.5 mg, 12.5 mg, Oral, BID, Florencia Reasons, MD, 12.5 mg at 04/15/21 0855   multivitamin with minerals tablet 1 tablet, 1 tablet, Oral, Daily, Sharen Hones, MD, 1 tablet at 04/15/21 0856   ondansetron (ZOFRAN) injection 4 mg, 4 mg, Intravenous, Q6H  PRN, Barrett, Erin R, PA-C, 4 mg at 04/04/21 2318   oxyCODONE (Oxy IR/ROXICODONE) immediate release tablet 5-10 mg, 5-10 mg, Oral, Q4H PRN, Saverio Danker, PA-C, 10 mg at 04/15/21 1346   pantoprazole (PROTONIX) EC tablet 40 mg, 40 mg, Oral, Daily, Bartle, Fernande Boyden, MD, 40 mg at 04/15/21 0856   sodium chloride flush (NS) 0.9 % injection 10-40 mL, 10-40 mL, Intracatheter, Q12H, Bartle, Fernande Boyden, MD, 10 mL at 04/15/21 1132   sodium chloride flush (NS) 0.9 % injection 10-40 mL, 10-40 mL, Intracatheter, PRN, Gaye Pollack, MD, 10 mL at 04/14/21 6256   Patients Current Diet:  Diet Order                  Diet Heart Room service appropriate? Yes with Assist; Fluid consistency: Thin  Diet effective now             Diet - low sodium heart healthy                         Precautions / Restrictions Precautions Precautions: Fall, Sternal Precaution Booklet Issued: Yes (comment) Precaution Comments: monitor O2, HR;  abdominal wound vac, colostomy Restrictions Weight Bearing Restrictions: No RUE Weight Bearing: Non weight bearing Other Position/Activity Restrictions: sternal precautions    Has the patient had 2 or more falls or a fall with injury in the past year? No   Prior Activity Level Community (5-7x/wk): Went out daily.  worked [employer].   Prior Functional Level Self Care: Did the patient need help bathing, dressing, using the toilet or eating? Independent   Indoor Mobility: Did the patient need assistance with walking from room to room (with or without device)? Independent   Stairs: Did the patient need assistance with internal or external stairs (with or without device)? Independent   Functional Cognition: Did the patient need help planning regular tasks such as shopping or remembering to take medications? Independent   Patient Information Are you of Hispanic, Latino/a,or Spanish origin?: A. No, not of Hispanic, Latino/a, or Spanish origin What is your race?: B. Black or African American Do you need or want an interpreter to communicate with a doctor or health care staff?: 0. No   Patient's Response To:  Health Literacy and Transportation Is the patient able to respond to health literacy and transportation needs?: Yes Health Literacy - How often do you need to have someone help you when you read instructions, pamphlets, or other written material from your doctor or pharmacy?: Sometimes In the past 12 months, has lack of transportation kept you from medical appointments or from getting medications?: No In the past 12 months, has lack of transportation kept you from meetings, work, or from getting things needed for daily living?: No   Development worker, international aid / [place] Devices/Equipment: None Home Equipment: Shower seat   Prior Device Use: Indicate devices/aids used by the patient prior to current illness, exacerbation or injury? None of the above   Development worker, international aid /  [place] Devices/Equipment: None Home Equipment: Shower seat   Prior Device Use: Indicate devices/aids used by the patient prior to current illness, exacerbation or injury? None of the above   Current Functional Level Cognition   Overall Cognitive Status: Within Functional Limits for tasks assessed Orientation Level: Oriented X4 Following Commands: Follows one step commands with increased time General Comments: pleasant, follows directions well, appropriate responses    Extremity Assessment (includes Sensation/Coordination)   Upper Extremity Assessment:  Generalized weakness LUE Deficits / Details: reports feels stiff and asking for a squeeze ball (provided her one)  Lower Extremity Assessment: Defer to PT evaluation     ADLs   Overall ADL's : Needs assistance/impaired Eating/Feeding: Set up, Sitting, Bed level Eating/Feeding Details (indicate cue type and reason): sat up on EOB to eat a bowl of rice krispies then her true breakfast arrived and she ate this while in bed Grooming: Wash/dry hands, Set up, Bed level Grooming Details (indicate cue type and reason): Declined grooming at sink at this time due to fatgiue Upper Body Bathing: Moderate assistance, Sitting Upper Body Bathing Details (indicate cue type and reason): in recliner Lower Body Bathing: Total assistance Lower Body Bathing Details (indicate cue type and reason): Min A +2 sit<>stand Upper Body Dressing : Maximal assistance, Sitting Upper Body Dressing Details (indicate cue type and reason): in recliner Lower Body Dressing: Total assistance, Bed level Lower Body Dressing Details (indicate cue type and reason): Pt with difficulty bringing ankles to needs and may need AE for LB ADLs Toilet Transfer: Minimal assistance, +2 for physical assistance, +2 for safety/equipment, Ambulation, Rolling walker (2 wheels) (simulated to recliner) Toilet Transfer Details (indicate cue type and reason): Min A +2 for power up  into standing. cues for hand positioning to maintain sternal precautions. Toileting- Clothing Manipulation and Hygiene: Total assistance Toileting - Clothing Manipulation Details (indicate cue type and reason): min A +2 sit<>stand Functional mobility during ADLs: Minimal assistance, +2 for safety/equipment, Rolling walker (2 wheels) General ADL Comments: Focus on progression of mobility to sink, declined need for ADLs this AM (already completed)     Mobility   Overal bed mobility: Needs Assistance Bed Mobility: Rolling, Sidelying to Sit, Sit to Sidelying Rolling: Min guard Sidelying to sit: Min guard Supine to sit: Mod assist Sit to sidelying: Min guard General bed mobility comments: min guard A for safety. cues for maintaining sternal precautions     Transfers   Overall transfer level: Needs assistance Equipment used: Rolling walker (2 wheels) Transfers: Sit to/from Stand Sit to Stand: Min assist, +2 physical assistance General transfer comment: Min A +2 for power up into standing. cues for hand positioning to maintain sternal precautions.     Ambulation / Gait / Stairs / Wheelchair Mobility   Ambulation/Gait Ambulation/Gait assistance: Counsellor (Feet): 45 Feet (+ 45 ft) Assistive device: Rolling walker (2 wheels) Gait Pattern/deviations: Step-through pattern, Decreased stride length, Trunk flexed General Gait Details: pt with small strides and clearance, dependent on UE support. reports fatigue needing seated rest after 40 ft Gait velocity: .3 m/s Gait velocity interpretation: <1.31 ft/sec, indicative of household ambulator     Posture / Balance Dynamic Sitting Balance Sitting balance - Comments: sat EOB without UE support Balance Overall balance assessment: Needs assistance Sitting-balance support: No upper extremity supported, Feet supported Sitting balance-Leahy Scale: Fair Sitting balance - Comments: sat EOB without UE support Standing balance support:  Bilateral upper extremity supported, During functional activity Standing balance-Leahy Scale: Poor Standing balance comment: dependent on BUE support and external assist     Special needs/care consideration Skin Skin Post op Chest and abdominal incisions/chest incisions with ileostomy, wound vac    Previous Home Environment (from acute therapy documentation) Living Arrangements: Alone Available Help at Discharge: Friend(s), Available PRN/intermittently, Other (Comment) Type of Home: House Home Layout: One level Home Access: Stairs to enter Entrance Stairs-Rails:  (pt reports she can't remember) Entrance Stairs-Number of Steps: 2 Bathroom Shower/Tub: Chiropodist:  Standard Home Care Services: No Additional Comments: pt states unsure of rail at home or assist available, otherwise not using any DME prior to arrival   Discharge Living Setting Plans for Discharge Living Setting: Alone, House (Lives alone) Type of Home at Discharge: House Discharge Home Layout: One level Discharge Home Access: Stairs to enter Entrance Stairs-Rails: Right Entrance Stairs-Number of Steps: 3 steps Discharge Bathroom Shower/Tub: Tub/shower unit, Door Discharge Bathroom Toilet: Standard Does the patient have any problems obtaining your medications?: No   Social/Family/Support Systems Patient Roles: Other (Comment) (Has a sister in [place] and an aunt.) Contact Information: Delman Cheadle - sister - [phone] Anticipated Caregiver: Friends and neighbors Caregiver Availability: Intermittent Discharge Plan Discussed with Primary Caregiver: Yes Is Caregiver In Agreement with Plan?: Yes Does Caregiver/Family have Issues with Lodging/Transportation while Pt is in Rehab?: No   Goals Patient/Family Goal for Rehab: PT/OT mod I goals Expected length of stay: 5-7 days Pt/Family Agrees to Admission and willing to participate: Yes Program Orientation Provided & Reviewed with Pt/Caregiver  Including Roles  & Responsibilities: Yes   Decrease burden of Care through IP rehab admission: Specialzed equipment needs   Possible need for SNF placement upon discharge: not anticipated    Patient Condition: I have reviewed medical records from [facility], spoken with CM, and patient. I met with patient at the bedside for inpatient rehabilitation assessment.  Patient will benefit from ongoing PT and OT, can actively participate in 3 hours of therapy a day 5 days of the week, and can make measurable gains during the admission.  Patient will also benefit from the coordinated team approach during an Inpatient Acute Rehabilitation admission.  The patient will receive intensive therapy as well as Rehabilitation physician, nursing, social worker, and care management interventions.  Due to bladder management, bowel management, safety, skin/wound care, disease management, medication administration, pain management, and patient education the patient requires 24 hour a day rehabilitation nursing.  The patient is currently min A-min guard with mobility and basic ADLs.  Discharge setting and therapy post discharge at home with home health is anticipated.  Patient has agreed to participate in the Acute Inpatient Rehabilitation Program and will admit today.   Preadmission Screen Completed By:  Genella Mech, 04/15/2021 1:47 PM ______________________________________________________________________   Discussed status with Dr. Naaman Plummer  on 04/15/21 at 26 and received approval for admission today.   Admission Coordinator:  Genella Mech, CCC-SLP, time 1350/Date 04/15/21    Assessment/Plan: Diagnosis:debility s/p aortic aneurysm repair Does the need for close, 24 hr/day Medical supervision in concert with the patient's rehab needs make it unreasonable for this patient to be served in a less intensive setting? Yes Co-Morbidities requiring supervision/potential complications: s/p colectomy with  ileostomy, CKD 3, morbid obesity Due to bladder management, bowel management, safety, skin/wound care, disease management, medication administration, pain management, and patient education, does the patient require 24 hr/day rehab nursing? Yes Does the patient require coordinated care of a physician, rehab nurse, PT, OT, and SLP to address physical and functional deficits in the context of the above medical diagnosis(es)? Yes Addressing deficits in the following areas: balance, endurance, locomotion, strength, transferring, bowel/bladder control, bathing, dressing, feeding, psychosocial support, and ileostomy education Can the patient actively participate in an intensive therapy program of at least 3 hrs of therapy 5 days a week? Yes The potential for patient to make measurable gains while on inpatient rehab is good Anticipated functional outcomes upon discharge from inpatient rehab: modified independent PT, modified independent  OT, n/a SLP Estimated rehab length of stay to reach the above functional goals is: 5-7d Anticipated discharge destination: Home 10. Overall Rehab/Functional Prognosis: good     MD Signature: Charlett Blake M.D. [facility] [facility] Fellow Am Acad of Phys Med and Rehab Diplomate Am Board of Electrodiagnostic Med Fellow Am Board of Interventional Pain

## 2021-04-15 NOTE — Progress Notes (Signed)
Admitted to the unit with no issues. Alert and oriented. Oriented to unit and assigned room. Denies pain. Assessments complete.

## 2021-04-15 NOTE — Discharge Summary (Signed)
Physician Discharge Summary  LARETA BRUNEAU XNA:[id number] DOB: 1960-06-11 DOA: 03/24/2021  PCP: Pcp, No  Admit date: 03/24/2021 Discharge date: 04/15/2021  Admitted From: Home  Disposition:  CIR  Recommendations for Outpatient Follow-up:  Follow up with PCP in 1-2 weeks Please obtain BMP/CBC in one week Continue with abdomen wound care and wound vac. Needs to follow up with SX.  Monitor renal function. Lasix discontinue 1/09. Monitor volume status/  Follow up with CVTS post Type A Aortic dissection repair,     Discharge Condition: Stable.  CODE STATUS: Full code Diet recommendation: Heart Healthy  Brief/Interim Summary: Presented to [facility] 12/18 w/ cc: chest pain and SOB. 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 Started on esmolol, seen by CVTS; went to OR where she underwent repair and replacement of ascending aorta and replacement of aortic arch w/ Hemashield graft. Returned to surgical ICU post op on 12/19   She subsequently developed abdominal pain needing to go back to operating room on 12/27.  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  Extended right hemicolectomy and ileostomy.   She eventually transferred to floor and TRH taking over starting 04/06/21 1/1.  Patient has significant short of breath and wheezing, elevated BNP, started on IV Lasix.  Most recent TEE showed ejection fraction more than 65%.    Type A aortic dissection.  She underwent repair and replacement of ascending aorta and replacement of aortic arch w/ Hemashield graft on 12/19 Management per vascular surgery  Stable.   Peritonitis secondary to incarcerated colon and perforated cecal volvulus and ischemic colon: Status post extended right 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 Finished abx treatment Off tpn Colonstomy is functioning Wound vac placed Plan per general surgery. Ok to transfer to CIR per Sx   Blood loss  anemia S/p mulitple prbc ,cryo, ffp plt transfusion  Hgb dropped to 6.8 on 1/6, hgb improved after  prbc,x1  no other overt sign of bleeding  Monitor sign of bleeding, monitor cbc   Afib/RVR on 12/31, paroxysmal , post op Intraoperative echocardiogram 12/18 with ejection fraction more than 65%. Off amiodarone drip, now on oral amiodarone, not a candidate for anticoagulation currently , need to be address after being discharged  Currently sinus rhythm   Acute on chronic diastolic chf, -acute hypoxic respiratory failure Developed sob with hypxia on 1/1, likely due to volume overload from blood product transfusion and tpn, she received iv lasix Lasix held due to elevation of cr Lasix resume 1/09. Cr 2.2. plan to hold lasix. Monitor volume. No LE edema.    Left arm /left breast edema Wonder dependent edema from laying on left side Will elevate left arm/breast Rotate iv out of left arm venous doppler negative for DVT on 1/1 Improving , mobilize  Hold lasix to avoid worsening renal function.   AKI on CKDIIIa Lasix held since 1/5 due to elevated Cr start to trend up,  Cr 2.2. Getting lasix 3 times per week. She appears dry side. Plan to hold lasix. Monitor renal function and volume status.  Prior cr per records 1.2--1.4.  Her cr has remain stable around 2 last several days.     HTN, bp stable   Physical deconditioning Patient report still walks prior to coming to the hospital Out of bed Wean oxygen Transfer to CIR today.      Nutritional Assessment:   The patients BMI is: Body mass index is 37.91 kg/m.Marland Kitchen Seen by dietician.  I  agree with the assessment and plan as outlined below: Nutrition Status: Nutrition Problem: Moderate Malnutrition Etiology: acute illness (aortic disection repair, bowel perforation) Signs/Symptoms: mild muscle depletion, energy intake < or equal to 50% for > or equal to 5 days Interventions: TPN   .  Discharge Diagnoses:  Principal Problem:    Status post surgery Active Problems:   Acute on chronic diastolic CHF (congestive heart failure) (HCC)   S/P ascending aortic replacement   Encounter for weaning from ventilator [facility])   Encounter for postanesthesia care   Peritonitis ([place])   Endotracheally intubated   Acute renal failure superimposed on stage 3a chronic kidney disease ([place])   Malnutrition of moderate degree   Acute hypoxemic respiratory failure [facility])    Discharge Instructions  Discharge Instructions     Amb Referral to Cardiac Rehabilitation   Complete by: As directed    Diagnosis: Other   After initial evaluation and assessments completed: Virtual Based Care may be provided alone or in conjunction with Phase 2 Cardiac Rehab based on patient barriers.: Yes   Diet - low sodium heart healthy   Complete by: As directed    Carb modified diet   Diet - low sodium heart healthy   Complete by: As directed    Discharge wound care:   Complete by: As directed    Per general surgery On continuous wound vac at 133mm/Hg   Discharge wound care:   Complete by: As directed    Continue with wound vac   Increase activity slowly   Complete by: As directed    Increase activity slowly   Complete by: As directed       Allergies as of 04/15/2021   No Known Allergies      Medication List     STOP taking these medications    amLODipine 10 MG tablet Commonly known as: NORVASC   aspirin 81 MG EC tablet Replaced by: aspirin 81 MG chewable tablet   carvedilol 25 MG tablet Commonly known as: COREG   cloNIDine 0.3 MG tablet Commonly known as: CATAPRES   hydrALAZINE 100 MG tablet Commonly known as: APRESOLINE       TAKE these medications    acetaminophen 500 MG tablet Commonly known as: TYLENOL Take 500 mg by mouth every 6 (six) hours as needed for mild pain or moderate pain.   amiodarone 200 MG tablet Commonly known as: PACERONE Take 1 tablet (200 mg total) by mouth 2 (two) times daily for 2 days.    amiodarone 200 MG tablet Commonly known as: PACERONE Take 1 tablet (200 mg total) by mouth daily. Start taking on: April 16, 2021   aspirin 81 MG chewable tablet Chew 4 tablets (324 mg total) by mouth daily. Replaces: aspirin 81 MG EC tablet   atorvastatin 80 MG tablet Commonly known as: LIPITOR Take 1 tablet (80 mg total) by mouth daily.   feeding supplement (GLUCERNA SHAKE) Liqd Take 237 mLs by mouth 3 (three) times daily between meals.   ferrous sulfate 325 (65 FE) MG tablet Take 1 tablet (325 mg total) by mouth daily with breakfast. Start taking on: April 16, 2021   ipratropium-albuterol 0.5-2.5 (3) MG/3ML Soln Commonly known as: DUONEB Take 3 mLs by nebulization 2 (two) times daily.   multivitamin with minerals Tabs tablet Take 1 tablet by mouth daily.   nitroGLYCERIN 0.4 MG SL tablet Commonly known as: NITROSTAT Place 1 tablet (0.4 mg total) under the tongue every 5 (five) minutes as needed for chest pain.  pantoprazole 40 MG tablet Commonly known as: PROTONIX Take 1 tablet (40 mg total) by mouth daily.   VITAMIN B-1 PO Take 1 tablet by mouth daily.               Discharge Care Instructions  (From admission, onward)           Start     Ordered   04/15/21 0000  Discharge wound care:       Comments: Continue with wound vac   04/15/21 1350   04/12/21 0000  Discharge wound care:       Comments: Per general surgery On continuous wound vac at 151mm/Hg   04/12/21 1353            Follow-up Information     [facility] [place]. Go on 04/24/2021.   Specialty: Cardiothoracic Surgery Why: PA/LAT CXR (to be taken at [place] which is in the same building as Dr. Vivi Martens office) on 01/18 at 2:30 pm;Appointment time is at 3:00 pm Contact information: [place], [place]        Skeet Latch, MD. Go on 05/07/2021.   Specialty:  Cardiology Why: Appointment time is at 9:15 am Contact information: [place] [address] [phone]         Jesusita Oka, MD Follow up on 05/09/2021.   Specialty: Surgery Why: 9:50 am, arrive by 9:20am for paperwork and check in process.  Please bring photo ID and insurance card Contact information: [address] [place] [address] [phone]                No Known Allergies  Consultations: CVTS Surgery    Procedures/Studies: CT ABDOMEN PELVIS WO CONTRAST  Result Date: 04/02/2021 CLINICAL DATA:  Hernia, complicated. EXAM: CT ABDOMEN AND PELVIS WITHOUT CONTRAST TECHNIQUE: Multidetector CT imaging of the abdomen and pelvis was performed following the standard protocol without IV contrast. COMPARISON:  CT abdomen and pelvis dated 03/24/2021. FINDINGS: Lower chest: New bibasilar consolidations, likely atelectasis. Surgical changes suggestive of ascending aorta replacement. Aneurysmal dilatation of the descending thoracic aorta, similar in caliber and configuration to earlier CT of 03/24/2021, further evaluation limited by the lack of intravascular contrast on today's study. Hepatobiliary: No acute-appearing abnormality within the liver. Stable appearance of the RIGHT hepatic cyst which measures approximally 5 cm greatest dimension. Vicarious excretion of contrast noted within the otherwise normal-appearing gallbladder. No bile duct dilatation is seen. Pancreas: Unremarkable. No pancreatic ductal dilatation or surrounding inflammatory changes. Spleen: Normal in size without focal abnormality. Adrenals/Urinary Tract: Bilateral adrenal fullness, but otherwise unremarkable. Kidneys appear normal without stone or hydronephrosis. Bladder appears normal. Stomach/Bowel: Midline abdominal wall hernia, multicompartmental, containing separate segments of the transverse colon. There is now a dilatation of the ascending colon and cecum (proximal to the  hernia site) with associated air-fluid levels, indicating mechanical obstruction at the hernia site. The descending colon is relatively decompressed indicating complete or high-grade partial obstruction. No associated small bowel dilatation at present. Vascular/Lymphatic: Abdominal aortic aneurysm, stable in the short-term interval, 4.6 cm in maximum diameter (previously 3.9 cm on CT of 06/2017). Further characterization limited by the lack of intravascular contrast. Reproductive: Uterus and bilateral adnexa are unremarkable. Other: Small amount of free fluid in the pelvis. Large amount of free intraperitoneal air within the upper abdomen. Additional extraluminal air within the hernia sac. Findings are compatible with a bowel perforation, likely involving the transverse colon at  or immediately adjacent to the hernia sac. Musculoskeletal: No acute-appearing osseous abnormality. Degenerative spondylosis of the thoracolumbar spine, mild in degree. Large amount of apparent soft tissue edema versus soft tissue hematoma is now seen along the LEFT lateral chest wall, incompletely imaged. IMPRESSION: 1. Large amount of free intraperitoneal air within the upper abdomen, with additional extraluminal air within the hernia sac. Findings are compatible with a bowel perforation, likely involving the transverse colon at or immediately adjacent to the hernia sac. 2. Midline abdominal wall hernia, multicompartmental, containing separate segments of the transverse colon. There is now a dilatation of the ascending colon and cecum (proximal to the hernia site) with associated air-fluid levels, indicating mechanical obstruction at the hernia site. The descending colon is relatively decompressed indicating complete or high-grade partial obstruction. 3. Abdominal aortic aneurysm, stable in the short-term interval, 4.6 cm in maximum diameter (previously 3.9 cm on CT of 06/2017). Further characterization limited by the lack of intravascular  contrast on today's study. 4. New bibasilar consolidations, likely atelectasis. 5. Large amount of apparent soft tissue edema versus soft tissue hematoma is now seen along the LEFT lateral chest wall, incompletely imaged. Clinical correlation recommended. Critical Value/emergent results were called by telephone at the time of interpretation on 04/02/2021 at 4:40 pm to provider [facility] , who verbally acknowledged these results. Electronically Signed   By: Franki Cabot M.D.   On: 04/02/2021 16:41   DG Chest 2 View  Result Date: 04/02/2021 CLINICAL DATA:  Effusions.  Distended abdomen. EXAM: CHEST - 2 VIEW COMPARISON:  Two-view chest x-ray 12/24/2 FINDINGS: Heart is enlarged. Right IJ sheath was removed. Left pleural effusion remains. Mild bibasilar atelectasis is present. No significant edema is present. No significant airspace consolidation IMPRESSION: Persistent left pleural effusion and bibasilar atelectasis. Electronically Signed   By: San Morelle M.D.   On: 04/02/2021 09:48   DG Abd 1 View  Result Date: 04/02/2021 CLINICAL DATA:  Pleural effusion, distended abdomen. EXAM: ABDOMEN - 1 VIEW COMPARISON:  Radiographs 03/31/2021, 03/30/2021 and 06/03/2017. CT 03/24/2021. FINDINGS: 0936 hours. Two supine views are submitted. Progressive increased gaseous distention of the right colon which could relate to colonic obstruction within the ventral hernia containing transverse colon on recent CT. There is gas throughout nondistended small bowel and distal colon. No supine evidence of free intraperitoneal air. Mild bibasilar atelectasis and a probable small right pleural effusion are noted. There are degenerative changes within the spine. IMPRESSION: Increased gaseous distention of the right colon which could be due to postoperative ileus or colonic obstruction within the known ventral hernia. Electronically Signed   By: Richardean Sale M.D.   On: 04/02/2021 09:53   DG Abd 1 View  Result Date:  03/30/2021 CLINICAL DATA:  Obstruction of bowel EXAM: ABDOMEN - 1 VIEW COMPARISON:  06/03/2017 FINDINGS: Gas throughout large and small bowel. Mild prominence of the right colon and hepatic flexure. No organomegaly or free air. IMPRESSION: Gas within nondistended small bowel and mildly prominent right colon. Electronically Signed   By: Rolm Baptise M.D.   On: 03/30/2021 12:10   CT Chest Wo Contrast  Result Date: 03/24/2021 CLINICAL DATA:  Aortic aneurysm. Chest pain. History of heart attack. Is tree of dissection. EXAM: CT CHEST WITHOUT CONTRAST TECHNIQUE: Multidetector CT imaging of the chest was performed following the standard protocol without IV contrast. COMPARISON:  CT scan of the chest October 07, 2017 FINDINGS: Cardiovascular: A thoracic aortic aneurysm is again identified. The ascending thoracic aorta measures 4 cm on series  2, image 80 today versus 3.5 cm in July of 2019. There is subtle high attenuation in the anterior aspect of the distal ascending thoracic aorta is seen on series 2, image 49 which could represent intramural hematoma. The aortic arch demonstrates a caliber of 5.6 cm as seen on series 2, image 39 versus 4.3 cm on the previous study. Mild calcified atherosclerosis is identified. The central pulmonary arteries are normal. The heart size is unchanged. Calcified atherosclerosis is seen in the left coronary arteries. Mediastinum/Nodes: There is a pericardial effusion measuring up to 1.5 cm with an attenuation of 40 Hounsfield units. This is a new finding. There is increased attenuation in the fat of the mediastinum, well seen on series 2, image 64 in the subcarinal region, series 2, image 55 in the precarinal region, and in the anterior mediastinum adjacent to the aortic arch. No adenopathy. The esophagus and thyroid are normal. Lungs/Pleura: Central airways are normal. No pneumothorax. No pulmonary nodules, masses, or focal infiltrates. Upper Abdomen: There is a ventral hernia containing  fat and bowel with no evidence of bowel obstruction in the upper abdomen. No other interval changes or acute abnormalities. Musculoskeletal: No chest wall mass or suspicious bone lesions identified. IMPRESSION: 1. 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 The ascending thoracic aorta measures 4 cm today versus 3.5 cm previously. The arch measures 5.6 cm today versus 4.3 cm previously. The previously identified dissection cannot be assessed on this study without contrast. However, high attenuation along the anterior aspect of the ascending thoracic aorta is concerning for intramural hematoma. The increased stranding and small amount of fluid in the mediastinum is highly concerning for a leaking aorta. The high attenuation fluid in the pericardium is concerning for hemopericardium. Recommend consultation with thoracic surgery. A CT angiogram of the chest could further evaluate if the patient is not taken directly to surgery. 2. Ventral hernia in the upper abdomen containing fat and bowel without evidence of bowel obstruction in the upper abdomen. Findings were discussed directly with Dr. Melina Copa. Electronically Signed   By: Dorise Bullion III M.D.   On: 03/24/2021 13:34   DG Chest Port 1 View  Result Date: 04/09/2021 CLINICAL DATA:  61 year old female status post abdominal surgery on December 27th for incarcerated hernia. Recent surgery for thoracic aortic repair. EXAM: PORTABLE CHEST 1 VIEW COMPARISON:  Noncontrast CT Chest, Abdomen, and Pelvis 04/08/2021 and earlier. FINDINGS: Portable AP semi upright view at 0554 hours. Changes of CABG. Stable cardiac size and mediastinal contours. Improved bibasilar ventilation since 04/06/2021, larger lung volumes. No pneumothorax or pulmonary edema. Visualized tracheal air column is within normal limits. Stable right PICC line, right axillary surgical clips. Paucity of bowel gas in the upper abdomen. IMPRESSION: 1.  Improved lung volumes and bibasilar ventilation since 04/06/2021. 2. Stable mediastinal contours status post thoracic aortic repair, CABG. 3. No new cardiopulmonary abnormality. Electronically Signed   By: Genevie Ann M.D.   On: 04/09/2021 08:21   DG CHEST PORT 1 VIEW  Result Date: 04/06/2021 CLINICAL DATA:  61 year old female with history of left-sided pleural effusion status post surgery. EXAM: PORTABLE CHEST 1 VIEW COMPARISON:  Chest x-ray 04/05/2021. FINDINGS: Previously noted enteric tube has been removed. There is a right upper extremity PICC with tip terminating in the mid superior vena cava. Lung volumes are low. There are bibasilar opacities which may reflect areas of atelectasis and/or consolidation. Small left pleural effusion. No definite right pleural effusion. No  pneumothorax. No evidence of pulmonary edema. Heart size is mildly enlarged. Upper mediastinal contours are remarkable for prominence of the thoracic aorta, similar to the prior study, compatible with known aortic aneurysm/dissection better demonstrated on recent chest CTA 03/24/2021. Status post median sternotomy for CABG. IMPRESSION: 1. Postoperative changes and support apparatus, as above. 2. Low lung volumes with bibasilar areas of atelectasis and/or consolidation and small left pleural effusion. 3. Known aortic aneurysm/dissection grossly similar in appearance to prior examinations, as above. Please see prior chest CT 03/24/2021 for full description. Electronically Signed   By: Vinnie Langton M.D.   On: 04/06/2021 11:01   DG CHEST PORT 1 VIEW  Result Date: 04/05/2021 CLINICAL DATA:  Pneumothorax EXAM: PORTABLE CHEST 1 VIEW COMPARISON:  04/03/2021 FINDINGS: Endotracheal tube is no longer present. Right PICC line and enteric tube again identified. Low lung volumes with bibasilar atelectasis. Similar cardiomediastinal contours. No pneumothorax. IMPRESSION: Lines and tubes as above. Low lung volumes with bibasilar atelectasis.  Electronically Signed   By: Macy Mis M.D.   On: 04/05/2021 08:15   DG Chest Port 1 View  Result Date: 04/03/2021 CLINICAL DATA:  PICC line repositioned. EXAM: PORTABLE CHEST 1 VIEW COMPARISON:  Earlier today. FINDINGS: The right PICC has been retracted with its tip approximally 2.5 cm proximal to the superior cavoatrial junction. Endotracheal tube in satisfactory position. Nasogastric tube extending into the stomach. Stable post CABG changes and right axillary surgical clips. Interval minimal left basilar atelectasis and stable minimal right basilar atelectasis. IMPRESSION: 1. Repositioned PICC tube with its tip 2.5 cm proximal to the superior cavoatrial junction. 2. Minimal bibasilar atelectasis. 3. Stable cardiomegaly. Electronically Signed   By: Claudie Revering M.D.   On: 04/03/2021 11:32   DG Chest Port 1 View  Result Date: 04/03/2021 CLINICAL DATA:  61 year old female with a history recent PICC, repair of thoracic aortic dissection EXAM: PORTABLE CHEST 1 VIEW COMPARISON:  04/02/2021 FINDINGS: Surgical changes of median sternotomy, for repair of acute dissection. Endotracheal tube terminates suitably above the carina. Gastric tube projects over the mediastinum and terminates out of the field of view. Interval placement of right upper extremity PICC with the tip appearing to terminate in the upper right atrium. No pneumothorax. Low lung volumes with asymmetric elevation the right hemidiaphragm. No new confluent airspace disease or interlobular septal thickening. Crowding of the central vasculature given the low lung volumes. IMPRESSION: Interval placement of right upper extremity PICC, with the tip appearing to terminate in the upper right atrium. Unchanged endotracheal tube and gastric tube, with redemonstration of surgical changes of median sternotomy and repair of aortic dissection Electronically Signed   By: Corrie Mckusick D.O.   On: 04/03/2021 09:39   DG CHEST PORT 1 VIEW  Result Date:  04/02/2021 CLINICAL DATA:  NG tube placement and intubation EXAM: PORTABLE CHEST 1 VIEW COMPARISON:  04/02/2021 FINDINGS: Postoperative changes in the mediastinum. Interval placement of an endotracheal tube with tip measuring 4.5 cm above the carina. An enteric tube is present. Tip is off the field of view but below the left hemidiaphragm. Shallow inspiration. Lungs are clear. Mild cardiac enlargement. Dilated aortic arch and descending aorta. Surgical clips in the right axilla. IMPRESSION: Appliances appear in satisfactory location. Cardiac enlargement. Dilated aorta. Lungs are clear. Electronically Signed   By: Lucienne Capers M.D.   On: 04/02/2021 22:01   DG CHEST PORT 1 VIEW  Result Date: 03/30/2021 CLINICAL DATA:  Pleural effusion EXAM: PORTABLE CHEST 1 VIEW COMPARISON:  03/29/2021 FINDINGS: Cardiomegaly. Prior  CABG. Interval removal of mediastinal drain. Tortuous, prominent aorta is unchanged. Vascular congestion and bibasilar atelectasis. No effusions or pneumothorax. IMPRESSION: Postoperative changes.  Bibasilar atelectasis.  No pneumothorax. Cardiomegaly, vascular congestion. Electronically Signed   By: Rolm Baptise M.D.   On: 03/30/2021 06:36   DG CHEST PORT 1 VIEW  Result Date: 03/29/2021 CLINICAL DATA:  Pleural effusion. Chest tube. EXAM: PORTABLE CHEST 1 VIEW COMPARISON:  03/28/2021 FINDINGS: Right internal jugular venous access sheath remains in place. Mediastinal drains remain in place. Previous median sternotomy and CABG. Cardiomegaly and aortic tortuosity as seen previously. Mild bibasilar atelectasis. No pneumothorax. No measurable pleural effusion. IMPRESSION: Mild bibasilar atelectasis. No pneumothorax. Mild basilar atelectasis. No measurable pleural effusion. Electronically Signed   By: Nelson Chimes M.D.   On: 03/29/2021 08:15   DG CHEST PORT 1 VIEW  Result Date: 03/28/2021 CLINICAL DATA:  Chest tube, ascending aortic replacement. EXAM: PORTABLE CHEST 1 VIEW COMPARISON:   03/27/2021 FINDINGS: Postoperative changes. Mediastinal drain and right internal jugular vascular sheath remain in place, unchanged. Cardiomegaly. Prominence of the aorta again noted, stable. Vascular congestion with left base atelectasis. No effusions or pneumothorax. IMPRESSION: Left base atelectasis, slightly improved since prior study. No pneumothorax. Cardiomegaly, vascular congestion. Electronically Signed   By: Rolm Baptise M.D.   On: 03/28/2021 08:09   DG CHEST PORT 1 VIEW  Result Date: 03/27/2021 CLINICAL DATA:  Post ascending aortic replacement, chest soreness, chest tube EXAM: PORTABLE CHEST 1 VIEW COMPARISON:  Portable exam 0509 hours compared to 03/26/2021 FINDINGS: Interval removal of endotracheal tube, nasogastric tube, and Swan-Ganz catheter. Mediastinal drains and RIGHT jugular catheter persist. RIGHT jugular catheter demonstrates a kink near the skin surface. Enlargement of cardiac silhouette post CABG. Widening of mediastinum likely related to a combination of aortic enlargement and postsurgical changes similar to previous exam. Persistent atelectasis LEFT lower lobe. No gross pleural effusion or pneumothorax. IMPRESSION: Stable postoperative changes. Electronically Signed   By: Lavonia Dana M.D.   On: 03/27/2021 08:41   DG Chest Port 1 View  Result Date: 03/26/2021 CLINICAL DATA:  Ascending aortic replacement, intubated, chest tube EXAM: PORTABLE CHEST 1 VIEW COMPARISON:  Chest radiograph 1 day prior FINDINGS: Endotracheal tube tip is approximately 5.1 cm from the carina. The right IJ Swan-Ganz catheter is in stable position likely in the proximal main pulmonary artery. A presumed mediastinal drain is seen terminating over the midline. A catheter projecting over the left upper quadrant may reflect the enteric catheter tip. The cardiomediastinal silhouette is grossly stable. Dense retrocardiac opacity and a small left pleural effusion are not significantly changed. Aeration of the left  upper lung is maintained. The right lung is clear. There is no significant right effusion. There is no appreciable pneumothorax. The bones are stable. IMPRESSION: 1. Support devices as above. 2. Unchanged dense retrocardiac opacity and small left pleural effusion. Overall, no significant interval change in lung aeration. Electronically Signed   By: Valetta Mole M.D.   On: 03/26/2021 08:21   DG Chest Port 1 View  Result Date: 03/25/2021 CLINICAL DATA:  Status post ascending thoracic aortic aneurysm repair EXAM: PORTABLE CHEST 1 VIEW COMPARISON:  03/24/2021 FINDINGS: 0539 hours. A tracheal tube tip is approximately 4.3 cm above the base of the carina. Right IJ pulmonary catheter is in the pulmonary outflow tract. The NG tube passes into the stomach although the distal tip position is not included on the film. Midline mediastinal/pericardial drain evident. The cardio pericardial silhouette is enlarged. With prominence of the transverse aorta.  Low lung volumes with some left basilar atelectasis and tiny left pleural effusion. Tiny left apical cap may be related to loculated pleural or extrapleural fluid. Telemetry leads overlie the chest. IMPRESSION: 1. Support apparatus as described. 2. Low lung volumes with left basilar atelectasis and tiny left pleural effusion. Electronically Signed   By: Misty Stanley M.D.   On: 03/25/2021 05:48   DG Chest Port 1 View  Result Date: 03/24/2021 CLINICAL DATA:  Chest pain. EXAM: PORTABLE CHEST 1 VIEW COMPARISON:  October 06, 2017 FINDINGS: The aorta appears more tortuous in the interval. This may be at least partially due to the portable technique. The heart, hila, mediastinum are otherwise unchanged. No pneumothorax. The lungs are clear. IMPRESSION: The aorta appears more pronounced and tortuous in the interval. This finding could be at least partially due to the portable technique. A PA and lateral chest x-ray may better evaluate. Alternatively, if there is concern for aortic  pathology, a CT scan would better evaluate. Electronically Signed   By: Dorise Bullion III M.D.   On: 03/24/2021 11:56   DG Abd Portable 1V  Result Date: 03/31/2021 CLINICAL DATA:  Abdominal pain EXAM: PORTABLE ABDOMEN - 1 VIEW COMPARISON:  03/30/2021 FINDINGS: Stomach is not distended. There is mild dilation of small-bowel loops. There is increase in the amount of small bowel gas. Gas is present in colon. IMPRESSION: Interval increase in amount of gas in small bowel loops may suggest ileus. Electronically Signed   By: Elmer Picker M.D.   On: 03/31/2021 09:10   ECHO INTRAOPERATIVE TEE (ANESTHESIA ONLY)  Result Date: 03/25/2021  *INTRAOPERATIVE TRANSESOPHAGEAL REPORT *  Patient Name:   LYLIA KARN [facility]  Date of Exam: 03/24/2021 Medical Rec #:  [id number]      Height:       67.0 in Accession #:    [id number]     Weight:       175.0 lb Date of Birth:  09-Jul-1960      BSA:          1.91 m Patient Age:    60 years       BP:           153/120 mmHg Patient Gender: F              HR:           58 bpm. Exam Location:  Anesthesiology Transesophogeal exam was perform intraoperatively during surgical procedure. Patient was closely monitored under general anesthesia during the entirety of examination. Indications:     Aortic Dissection Performing Phys: [id number] Thurmond Butts P ELLENDER Complications: No known complications during this procedure. POST-OP IMPRESSIONS _ Left Ventricle: The left ventricle is unchanged from pre-bypass. _ Right Ventricle: The right ventricle appears unchanged from pre-bypass. _ Aorta: There is no dissection present in the aortic root. A graft was placed in the ascending aorta for repair. _ Left Atrial Appendage: The left atrial appendage appears unchanged from pre-bypass. _ Aortic Valve: There is trace regurgitation. The jet is directed posteriorly. On short axis view there is a small coaptation defect between the left coronary cusp and the non coronary cusp. _ Mitral Valve: The mitral valve  appears unchanged from pre-bypass. _ Tricuspid Valve: The tricuspid valve appears unchanged from pre-bypass. _ Pulmonic Valve: The pulmonic valve appears unchanged from pre-bypass. _ Interatrial Septum: The interatrial septum appears unchanged from pre-bypass. _ Pericardium: The pericardium appears unchanged from pre-bypass. PRE-OP FINDINGS  Left Ventricle: The left ventricle has hyperdynamic systolic function,  with an ejection fraction of >65%. The cavity size was normal. There is severely increased left ventricular wall thickness. There is severe concentric left ventricular hypertrophy. Right Ventricle: The right ventricle has normal systolic function. The cavity was normal. There is no increase in right ventricular wall thickness. Left Atrium: Left atrial size was normal in size. No left atrial/left atrial appendage thrombus was detected. Right Atrium: Right atrial size was normal in size. Interatrial Septum: No atrial level shunt detected by color flow Doppler. Pericardium: A small pericardial effusion is present. Mitral Valve: The mitral valve is normal in structure. Mitral valve regurgitation is mild by color flow Doppler. Tricuspid Valve: The tricuspid valve was normal in structure. Tricuspid valve regurgitation was not visualized by color flow Doppler. Aortic Valve: The aortic valve is normal in structure. Aortic valve regurgitation was not visualized by color flow Doppler. There is no stenosis of the aortic valve. Pulmonic Valve: The pulmonic valve was normal in structure. Pulmonic valve regurgitation is not visualized by color flow Doppler. Aorta: There is mild dilatation of the aortic root and of the ascending aorta. There is evidence of a dissection in the aortic root and ascending aorta. The dissection extends proximally to the sinus of valsalva. The descending aorta is dilated.  Adele Barthel MD Electronically signed by Adele Barthel MD Signature Date/Time: 03/25/2021/4:14:01 AM    Final    CT Angio  Chest/Abd/Pel for Dissection W and/or W/WO  Result Date: 03/24/2021 CLINICAL DATA:  Patient with history of type B descending thoracic aortic aneurysm, now with concern for intramural hematoma on noncontrast chest CT performed earlier today. EXAM: CT ANGIOGRAPHY CHEST, ABDOMEN AND PELVIS TECHNIQUE: Non-contrast CT of the chest was initially obtained. Multidetector CT imaging through the chest, abdomen and pelvis was performed using the standard protocol during bolus administration of intravenous contrast. Multiplanar reconstructed images and MIPs were obtained and reviewed to evaluate the vascular anatomy. CONTRAST:  64mL OMNIPAQUE IOHEXOL 350 MG/ML SOLN COMPARISON:  Noncontrast chest CT-earlier same day Chest CT-10/07/2017 FINDINGS: CTA CHEST FINDINGS Vascular Findings: Interval development of a short-segment type A thoracic aortic dissection beginning at the aortic root and extending to the posterior aspect of the aortic arch, peripheral to the takeoff of the left subclavian artery. The branch vessels of the aortic arch appear to arise from the true lumen and are not affected by the short-segment dissection. Conventional configuration of the aortic arch. High-density material is seen about the aortic root and within the pericardium sac compatible with hemorrhage though a discrete area of active extravasation is not identified. Similar findings of known short-segment type B thoracic aortic dissection, though now with nonocclusive mural thrombus within the false lumen involving the proximal descending thoracic aorta (image 41, series 4). The thoracic aorta is aneurysmal with measurements as follows. Normal heart size. Although this examination was not tailored for the evaluation the pulmonary arteries, there are no discrete filling defects within the central pulmonary arterial tree to suggest central pulmonary embolism. Normal caliber of the main pulmonary artery.  ------------------------------------------------------------- Thoracic aortic measurements: SINOTUBULAR JUNCTION: 35 mm as measured in greatest oblique short axis coronal dimension. PROXIMAL ASCENDING THORACIC AORTA: 40 mm as measured in greatest oblique short axis axial dimension at the level of the main pulmonary artery (axial image 49, series 4) and approximately 43 mm in greatest oblique short axis coronal diameter (coronal image 78, series 8) AORTIC ARCH: 47 mm as measured in greatest oblique short axis sagittal dimension (sagittal image 130, series 9). PROXIMAL DESCENDING THORACIC AORTA:  35 mm as measured in greatest oblique short axis axial dimension at the level of the main pulmonary artery. DISTAL DESCENDING THORACIC AORTA: 44 mm as measured in greatest oblique short axis axial dimension at the level of the diaphragmatic hiatus (axial image 91, series 4). Review of the MIP images confirms the above findings. ------------------------------------------------------------- Non-Vascular Findings: Mediastinum/Lymph Nodes: No bulky mediastinal, hilar or axillary lymphadenopathy. As above, high-density material is seen about the aortic root and pericardium worrisome for hemorrhage though discrete area active extravasation is not identified. Lungs/Pleura: Subsegmental atelectasis adjacent to the aneurysmal descending thoracic aorta. Minimal dependent subpleural ground-glass atelectasis. No discrete focal airspace opacities. No pleural effusion or pneumothorax. The central pulmonary airways appear widely patent. No discrete pulmonary nodules. Musculoskeletal: No acute or aggressive osseous abnormalities. Regional soft tissues appear normal. The thyroid gland is atrophic but otherwise normal in appearance. _________________________________________________________ _________________________________________________________ CTA ABDOMEN AND PELVIS FINDINGS VASCULAR Aorta: Interval increase in size of infrarenal abdominal  aortic aneurysm now measuring 4.6 cm in maximal diameter (image 134, series 4, previously, 3.8 cm (when compared to the 06/2017 examination. The aneurysm is estimated to arise approximately 1.5 cm caudal to the take-off of the most inferior left renal artery and extend to approximately 1.3 cm above the aortic bifurcation. There is a large amount of crescentic mural thrombus within the dominant component of the aneurysm, not definitely resulting in hemodynamically significant stenosis. No evidence of abdominal aortic dissection or perivascular stranding. Celiac: There is a minimal amount of mixed calcified and noncalcified atherosclerotic plaque involving the origin the celiac artery, not resulting in a hemodynamically significant stenosis. Conventional branching pattern. SMA: There is a moderate amount of eccentric mixed calcified and noncalcified atherosclerotic plaque involving the origin and proximal aspects of the SMA, not resulting in a hemodynamically significant stenosis. The distal tributaries of the SMA appear widely patent without discrete lumen filling defect to suggest distal embolism. Renals: Solitary bilaterally. There is a minimal amount of calcified atherosclerotic plaque involving the origin of the bilateral renal arteries, not resulting in hemodynamically significant stenosis. No vessel irregularity to suggest FMD. IMA: Remains patent though its origin is surrounded by crescentic mural thrombus at the level of the abdominal aortic aneurysm. Inflow: There is a moderate amount of mixed calcified and noncalcified atherosclerotic plaque involving the bilateral common iliac arteries, not resulting in hemodynamically significant stenosis. The bilateral internal iliac arteries are mildly disease though patent and of normal caliber. The bilateral external iliac arteries are markedly tortuous though of normal caliber and widely patent without hemodynamically significant stenosis. The bilateral common and  imaged portions of the bilateral deep and superficial femoral arteries are without a definitive hemodynamically significant narrowing. Veins: The IVC and pelvic venous systems appear patent on this arterial phase examination. Review of the MIP images confirms the above findings. _________________________________________________________ NON-VASCULAR Hepatobiliary: Normal hepatic contour. Note is made of an approximately 5.0 cm hypoattenuating cyst within the caudate (represent image 104, series 4). No discrete hyperenhancing hepatic lesions. Normal appearance of the gallbladder given degree distention. No radiopaque gallstones. No intra or extrahepatic biliary ductal dilatation. No ascites. Pancreas: Normal appearance of the pancreas. Spleen: Normal appearance of the spleen. Adrenals/Urinary Tract: There is symmetric enhancement of the bilateral kidneys. No evidence of nephrolithiasis on this postcontrast examination. No urine obstruction or perinephric stranding. There is mild thickening the bilateral adrenal glands, left greater than right, without discrete nodule. Diffuse thickening of the urinary bladder wall, potentially accentuated due to underdistention. Stomach/Bowel: There are two portions of the  transverse colon contained within a wide lack midline ventral wall abdominal hernia, not resulting in enteric obstruction though small amount of fluid is seen within the caudal aspect of the hernia. Moderate colonic stool burden without evidence of enteric obstruction. Normal appearance of the terminal ileum and the retrocecal appendix. No significant hiatal hernia. No pneumoperitoneum, pneumatosis or portal venous gas. Lymphatic: No bulky retroperitoneal, mesenteric, pelvic or inguinal lymphadenopathy. Reproductive: Normal appearance of the pelvic organs for age. No discrete adnexal lesion. No free fluid the pelvic cul-de-sac. Other: Wide neck midline ventral wall hernia contains two discrete portions of the  transverse colon. The hernia is estimated to measure at least 15.4 x 11.9 x 7.4 cm (sagittal image 132, series 9; axial image 125, series 4), while the neck of the hernia is estimated to measure at least 5.9 x 5.6 cm (axial image 126, series 4; sagittal image 130, series 9). Musculoskeletal: No acute or aggressive osseous abnormalities. Moderate DDD of L5-S1 with disc space height loss, endplate irregularity and sclerosis. Review of the MIP images confirms the above findings. IMPRESSION: Chest CTA impression: 1. The examination is positive for an acute short-segment ascending thoracic aortic dissection with flow seen within both the true and false lumens. High-density fluid is seen about the aortic root and pericardial sac compatible with hemorrhage though a discrete area of active extravasation is not identified. The type A dissection terminates at the level of the posterior aspect of the aortic arch without extension or involvement of the branch vessels of the aortic arch, all of which appear to arise from the true lumen. 2. 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 Abdomen and pelvic CTA impression: 1. No acute findings within the abdomen or pelvis. 2. Interval increase in size of infrarenal abdominal aortic aneurysm, now measuring 4.6 cm in maximum diameter, previously, 3.9 cm (when compared to the 06/2017 examination). There is a large amount of crescentic mural thrombus within the dominant component of the aneurysm, not resulting in a hemodynamically significant stenosis. No evidence of abdominal aortic dissection or periaortic stranding. Aortic aneurysm NOS (ICD10-I71.9). 3. Wide neck midline ventral wall hernia contains two separate portions of the transverse colon, not resulting in enteric obstruction. Critical Value/emergent results were called by telephone at the time of interpretation on 03/24/2021 at 2:36 pm to provider [facility] , who verbally  acknowledged these results. Electronically Signed   By: Sandi Mariscal M.D.   On: 03/24/2021 15:06   VAS [place] UPPER EXTREMITY VENOUS DUPLEX  Result Date: 04/07/2021 UPPER VENOUS STUDY  Patient Name:  ZITLALY MALSON [facility]  Date of Exam:   04/07/2021 Medical Rec #: [id number]      Accession #:    [id number] Date of Birth: 01/11/1961      Patient Gender: F Patient Age:   22 years Exam Location:  [facility] Procedure:      VAS [place] UPPER EXTREMITY VENOUS DUPLEX Referring Phys: Patrick Jupiter GOLD --------------------------------------------------------------------------------  Indications: Swelling Comparison Study: swelling Performing Technologist: Archie Patten RVS  Examination Guidelines: A complete evaluation includes B-mode imaging, spectral Doppler, color Doppler, and power Doppler as needed of all accessible portions of each vessel. Bilateral testing is considered an integral part of a complete examination. Limited examinations for reoccurring indications may be performed as noted.  Right Findings: +----------+------------+---------+-----------+----------+-------+  RIGHT      Compressible Phasicity Spontaneous Properties Summary  +----------+------------+---------+-----------+----------+-------+  Subclavian  Yes        Yes                         +----------+------------+---------+-----------+----------+-------+  Left Findings: +----------+------------+---------+-----------+----------+-------+  LEFT       Compressible Phasicity Spontaneous Properties Summary  +----------+------------+---------+-----------+----------+-------+  IJV            Full        Yes        Yes                         +----------+------------+---------+-----------+----------+-------+  Subclavian     Full        Yes        Yes                         +----------+------------+---------+-----------+----------+-------+  Axillary       Full        Yes        Yes                          +----------+------------+---------+-----------+----------+-------+  Brachial       Full        Yes        Yes                         +----------+------------+---------+-----------+----------+-------+  Radial         Full                                               +----------+------------+---------+-----------+----------+-------+  Ulnar          Full                                               +----------+------------+---------+-----------+----------+-------+  Cephalic       Full                                               +----------+------------+---------+-----------+----------+-------+  Basilic        Full                                               +----------+------------+---------+-----------+----------+-------+  Summary:  Right: No evidence of thrombosis in the subclavian.  Left: No evidence of deep vein thrombosis in the upper extremity. No evidence of superficial vein thrombosis in the upper extremity.  *See table(s) above for measurements and observations.  Diagnosing physician: Monica Martinez MD Electronically signed by Monica Martinez MD on 04/07/2021 at 11:35:37 AM.    Final    [place] EKG SITE RITE  Result Date: 04/02/2021 If Site Rite image not attached, placement could not be confirmed due to current cardiac rhythm.  CT CHEST ABDOMEN PELVIS WO CONTRAST  Result Date: 04/08/2021 CLINICAL DATA:  Sepsis, recent exploratory laparotomy 04/02/2021 EXAM: CT CHEST, ABDOMEN AND  PELVIS WITHOUT CONTRAST TECHNIQUE: Multidetector CT imaging of the chest, abdomen and pelvis was performed following the standard protocol without IV contrast. Examination was performed without intravenous contrast due to renal insufficiency, and evaluation of the vascular structures and solid viscera is limited. Techniques to minimize radiation exposure, such as automated exposure control, adjustment of mA and/or kV according to patient size, or iterative reconstruction, are utilized, when appropriate, to reduce  radiation dose to as low as reasonably achievable. COMPARISON:  03/24/2021, 04/02/2021 FINDINGS: CT CHEST FINDINGS Cardiovascular: Interval postsurgical changes from median sternotomy and ascending thoracic aortic dissection repair. Evaluation is extremely limited without intravenous contrast. 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 Again, evaluation of the vascular lumen is limited without IV contrast. There is evidence of interposition grafts placed between the bilateral common carotid arteries and right subclavian artery. A right-sided PICC extends to the superior vena cava. The heart is unremarkable without pericardial effusion. Mediastinum/Nodes: Postsurgical changes are seen within the anterior mediastinum related to recent median sternotomy. Small amount of fluid within the anterior mediastinum deep to the sternum measures up to 1.9 x 2.8 cm, likely postoperative hematoma. Thyroid, trachea, and esophagus are grossly unremarkable. No pathologic adenopathy. Lungs/Pleura: Trace bilateral pleural effusions with dependent compressive atelectasis. Airspace disease or pneumothorax. The central airways are patent. Musculoskeletal: No acute displaced fractures. No destructive bony lesions. There is extensive edema throughout the left breast and left lateral chest wall of uncertain etiology. Reconstructed images demonstrate no additional findings. CT ABDOMEN PELVIS FINDINGS Hepatobiliary: High attenuation material within the gallbladder compatible with sludge. Stable caudate cyst within the liver. No intrahepatic duct dilation. Subcapsular hypodensity along the ventral left lobe liver image 56/3 measuring 3.1 x 1.1 cm likely reflects a small amount of subcapsular fluid. Pancreas: Unremarkable unenhanced appearance. Spleen: Unremarkable unenhanced appearance. Adrenals/Urinary Tract: No urinary tract calculi or obstructive  uropathy. Adrenals are stable. Bladder is decompressed with a Foley catheter. Stomach/Bowel: Distended loops of proximal small bowel are identified, measuring up to 4 cm in diameter. There are multiple proximal small-bowel gas fluid levels. Mid to distal jejunum and ileum are decompressed, with diverting ileostomy in the right mid abdomen. Exact transition point between dilated and nondilated bowel is not well visualized. Postsurgical changes are seen from proximal colonic resection. No evidence of bowel wall thickening. Vascular/Lymphatic: Stable dilation of the visualized thoracoabdominal aorta, with diffuse atherosclerosis. The chronic dissection seen previously is not well visualized given the lack of intravenous contrast. No pathologic adenopathy. Reproductive: Uterus and bilateral adnexa are unremarkable. Other: There are 2 surgical drains within the lower pelvis and right hemiabdomen. Small amount of free fluid is seen scattered throughout the abdomen and pelvis. No obvious fluid collection or abscess on this limited unenhanced exam. No evidence of pneumoperitoneum. Postsurgical changes are seen from midline laparotomy, with surgical packing in place at the incision site. There is a small right periumbilical fat containing ventral hernia, though the large ventral hernia seen previously has been completely repaired in the interim. Extensive subcutaneous edema within the bilateral flanks. Musculoskeletal: No acute or destructive bony lesions. Reconstructed images demonstrate no additional findings. IMPRESSION: CT chest: 1. Interval postsurgical changes from ascending thoracic aortic dissection repair. Evaluation is extremely limited given the lack of intravenous contrast. 2. 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 Again, evaluation is extremely limited given the lack of intravenous contrast. 3. Trace bilateral pleural effusions with dependent bilateral lower lobe atelectasis.  4. Marked  edema within the left lateral chest wall and left breast. 5.  Aortic Atherosclerosis (ICD10-I70.0). CT abdomen/pelvis: 1. Postsurgical changes from midline laparotomy, proximal colonic resection, and diverting ileostomy. 2. Proximal small bowel dilation with gas fluid levels, compatible with small-bowel obstruction. Decompression of the mid to distal jejunum and ileum, with no clear transition point identified on this limited exam. 3. Trace free fluid within the abdomen and pelvis, with no obvious abscess on this study limited by the lack of IV contrast. 4. High attenuation material within the gallbladder consistent with gallbladder sludge. No evidence of acute cholecystitis. 5. Interval repair of a large midline ventral hernia. Small residual right periumbilical fat containing ventral hernia identified. 6. Dependent body wall edema. 7.  Aortic Atherosclerosis (ICD10-I70.0). Electronically Signed   By: Randa Ngo M.D.   On: 04/08/2021 16:21     Subjective: No new complaints. Colostomy bag with stool.    Discharge Exam: Vitals:   04/15/21 0845 04/15/21 1140  BP: 118/75 107/70  Pulse: 80 71  Resp: 18 14  Temp: 98.4 F (36.9 C) 98.3 F (36.8 C)  SpO2: 95% 93%     General: Pt is alert, awake, not in acute distress Cardiovascular: RRR, S1/S2 +, no rubs, no gallops, chest incision healing Respiratory: CTA bilaterally, no wheezing, no rhonchi Abdominal: Soft, NT, ND, bowel sounds + colostomy with stool in the bag. Wound vac in place.  Extremities: no edema, no cyanosis    The results of significant diagnostics from this hospitalization (including imaging, microbiology, ancillary and laboratory) are listed below for reference.     Microbiology: No results found for this or any previous visit (from the past 240 hour(s)).   Labs: BNP (last 3 results) Recent Labs    03/28/21 0358 03/29/21 0214 04/07/21 0851  BNP 898.7* 954.4* 6,295.2*   Basic Metabolic Panel: Recent Labs  Lab  04/09/21 0500 04/10/21 0402 04/11/21 0450 04/12/21 0445 04/13/21 0146 04/14/21 0140 04/15/21 0847  NA 140 139 139 139 140 139 138  K 4.5 4.9 4.7 4.5 4.6 5.0 4.5  CL 100 98 98 100 102 103 100  CO2 31 31 32 $Rem'30 29 27 28  'hLiG$ GLUCOSE 155* 162* 142* 109* 122* 105* 156*  BUN 49* 46* 42* 41* 37* 29* 24*  CREATININE 1.92* 2.17* 2.34* 2.25* 2.30* 2.15* 2.21*  CALCIUM 7.9* 8.1* 8.2* 8.1* 8.4* 8.3* 8.9  MG 2.0 1.8 2.0  --   --  2.0  --   PHOS 4.3 5.1*  --   --   --   --   --    Liver Function Tests: Recent Labs  Lab 04/10/21 0402  AST 27  ALT 33  ALKPHOS 100  BILITOT 1.8*  PROT 5.8*  ALBUMIN 1.8*   No results for input(s): LIPASE, AMYLASE in the last 168 hours. No results for input(s): AMMONIA in the last 168 hours. CBC: Recent Labs  Lab 04/09/21 0500 04/10/21 0402 04/11/21 0450 04/12/21 0445 04/13/21 0146 04/14/21 0140 04/15/21 0847  WBC 17.8* 17.0* 17.0* 15.7* 15.7* 14.3* 11.9*  NEUTROABS 13.7* 13.3*  --   --   --  11.6*  --   HGB 6.6* 7.0* 7.1* 6.8* 7.8* 7.6* 8.1*  HCT 19.7* 21.2* 21.6* 21.5* 24.0* 23.8* 26.0*  MCV 94.7 94.6 96.9 98.2 98.4 99.6 100.8*  PLT 284 283 367 410* 423* 483* 541*   Cardiac Enzymes: No results for input(s): CKTOTAL, CKMB, CKMBINDEX, TROPONINI in the last 168 hours. BNP: Invalid input(s): POCBNP CBG: Recent Labs  Lab 04/14/21 1201 04/14/21 1737 04/15/21 0043 04/15/21 0556 04/15/21 1138  GLUCAP 124* 96 108* 112* 115*   D-Dimer No results for input(s): DDIMER in the last 72 hours. Hgb A1c No results for input(s): HGBA1C in the last 72 hours. Lipid Profile No results for input(s): CHOL, HDL, LDLCALC, TRIG, CHOLHDL, LDLDIRECT in the last 72 hours. Thyroid function studies No results for input(s): TSH, T4TOTAL, T3FREE, THYROIDAB in the last 72 hours.  Invalid input(s): FREET3 Anemia work up No results for input(s): VITAMINB12, FOLATE, FERRITIN, TIBC, IRON, RETICCTPCT in the last 72 hours. Urinalysis    Component Value Date/Time    COLORURINE YELLOW 05/31/2017 1407   APPEARANCEUR CLEAR 05/31/2017 1407   LABSPEC 1.024 05/31/2017 1407   PHURINE 5.0 05/31/2017 1407   GLUCOSEU NEGATIVE 05/31/2017 1407   HGBUR NEGATIVE 05/31/2017 1407   BILIRUBINUR NEGATIVE 05/31/2017 1407   KETONESUR NEGATIVE 05/31/2017 1407   PROTEINUR 30 (A) 05/31/2017 1407   UROBILINOGEN 0.2 05/26/2008 1446   NITRITE NEGATIVE 05/31/2017 1407   LEUKOCYTESUR NEGATIVE 05/31/2017 1407   Sepsis Labs Invalid input(s): PROCALCITONIN,  WBC,  LACTICIDVEN Microbiology No results found for this or any previous visit (from the past 240 hour(s)).   Time coordinating discharge: 40 minutes  SIGNED:   Elmarie Shiley, MD  [facility]

## 2021-04-15 NOTE — Progress Notes (Signed)
Pt tx to 4W17 with all belongings. IV removed, clean and intact. Sister called and informed of new room.  Clyde Canterbury, RN
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
## 2021-04-15 NOTE — Progress Notes (Signed)
° °   °  [address]       [employer] [address]             [phone]      13 Days Post-Op Procedure(s) (LRB): EXPLORATORY LAPAROTOMY (N/A) EXTENDED COLON RESECTION ILEOSTOMY (N/A) HERNIA REPAIR VENTRAL ADULT LYSIS OF ADHESION, EXTENSIVE (N/A) Subjective: Conts to show steady improvement, tolerating diet  Objective: Vital signs in last 24 hours: Temp:  [98 F (36.7 C)-98.7 F (37.1 C)] 98 F (36.7 C) (01/09 0350) Pulse Rate:  [58-77] 66 (01/09 0350) Cardiac Rhythm: Normal sinus rhythm (01/08 1957) Resp:  [11-18] 18 (01/09 0350) BP: (112-132)/(74-87) 132/80 (01/09 0350) SpO2:  [89 %-96 %] 96 % (01/09 0350)  Hemodynamic parameters for last 24 hours:    Intake/Output from previous day: 01/08 0701 - 01/09 0700 In: 720 [P.O.:720] Out: 1780 [Urine:950; Drains:130; Stool:700] Intake/Output this shift: No intake/output data recorded.  General appearance: alert, cooperative, and no distress Heart: regular rate and rhythm Lungs: clear to auscultation bilaterally Abdomen: soft, non tender Extremities: no edema Wound: sternotomy incis healing well, abdominal wound Vac in place  Lab Results: Recent Labs    04/13/21 0146 04/14/21 0140  WBC 15.7* 14.3*  HGB 7.8* 7.6*  HCT 24.0* 23.8*  PLT 423* 483*   BMET:  Recent Labs    04/13/21 0146 04/14/21 0140  NA 140 139  K 4.6 5.0  CL 102 103  CO2 29 27  GLUCOSE 122* 105*  BUN 37* 29*  CREATININE 2.30* 2.15*  CALCIUM 8.4* 8.3*    PT/INR: No results for input(s): LABPROT, INR in the last 72 hours. ABG    Component Value Date/Time   PHART 7.327 (L) 04/05/2021 0843   HCO3 21.9 04/05/2021 0843   TCO2 23 04/05/2021 0843   ACIDBASEDEF 4.0 (H) 04/05/2021 0843   O2SAT 92.0 04/05/2021 0843   CBG (last 3)  Recent Labs    04/14/21 1737 04/15/21 0043 04/15/21 0556  GLUCAP 96 108* 112*    Meds Scheduled Meds:  acetaminophen  1,000 mg Oral Q6H   amiodarone  200 mg Oral Daily   aspirin  324 mg Oral  Daily   chlorhexidine  15 mL Mouth Rinse BID   Chlorhexidine Gluconate Cloth  6 each Topical Daily   feeding supplement (GLUCERNA SHAKE)  237 mL Oral TID BM   furosemide  40 mg Oral Q M,W,F   heparin  5,000 Units Subcutaneous Q8H   insulin aspart  0-24 Units Subcutaneous Q6H   mouth rinse  15 mL Mouth Rinse q12n4p   metoprolol tartrate  12.5 mg Oral BID   multivitamin with minerals  1 tablet Oral Daily   pantoprazole  40 mg Oral Daily   sodium chloride flush  10-40 mL Intracatheter Q12H   Continuous Infusions: PRN Meds:.hydrALAZINE, ipratropium-albuterol, LORazepam, methocarbamol, ondansetron (ZOFRAN) IV, oxyCODONE, sodium chloride flush  Xrays No results found.  Assessment/Plan: S/P Procedure(s) (LRB): EXPLORATORY LAPAROTOMY (N/A) EXTENDED COLON RESECTION ILEOSTOMY (N/A) HERNIA REPAIR VENTRAL ADULT LYSIS OF ADHESION, EXTENSIVE (N/A)  1 afeb, VSS, remains in sinus rhythm 2 sats good on RA 3 stooling well in ostomy 4 BS well controlled 5 no labs today 6 conts medical management per hospitalist, abdominal surgery issues per gen surgery/wound care- appears quite stable 7 cont therapies, awaits tx to CIR    LOS: 22 days    Dorothy Giovanni PA-C Pager [phone] 04/15/2021

## 2021-04-15 NOTE — Consult Note (Signed)
[facility] Nurse wound follow up Wound type:surgical midline abdominal Measurement:unchanged  22 cm x 7 cm x 4 cm  Wound OEV:OJJKK red, friable.  Darkened edges at 4 o'clock on distal end. (Photo in chart) Drainage (amount, consistency, odor) moderate bleeding with dressing change. No odor Periwound:RMQ ileostomy  otherwise intact, rounded abdomen Dressing procedure/placement/frequency:2 pieces black foam used today to fill dead space.  PA at bedside to evaluate wound as well.  Seal immediately achieved. Change MOn/Wed/Fri.   WOC Nurse ostomy follow up Stoma type/location: RMQ ileostomy Stomal assessment/size: 1 1/2" pink patent and producing brown stool. Stoma is flush from 5 to 7 o'clock Peristomal assessment: intact.  Rounded abdomen. Treatment options for stomal/peristomal skin: 2 piece 2 3/4" pouch with barrier ring.  Output soft brown stool Ostomy pouching: 2pc. 2 3/4" pouch with barrier ring  Education provided: POuch change provided today.  Patient able to cut barrier opening today.  Cuts opening somewhat larger, but has barrier ring and explained purpose of ring.She is able to assemble pouch and roll closed. Agrees to assist staff with emptying once 1/3 full.  Enrolled patient in [facility] Start Discharge program: Yes Will follow.  Domenic Moras MSN, RN, FNP-BC CWON Wound, Ostomy, Continence Nurse Pager [phone]

## 2021-04-16 DIAGNOSIS — R5381 Other malaise: Secondary | ICD-10-CM | POA: Diagnosis not present

## 2021-04-16 LAB — GLUCOSE, CAPILLARY
Glucose-Capillary: 107 mg/dL — ABNORMAL HIGH (ref 70–99)
Glucose-Capillary: 109 mg/dL — ABNORMAL HIGH (ref 70–99)
Glucose-Capillary: 115 mg/dL — ABNORMAL HIGH (ref 70–99)
Glucose-Capillary: 81 mg/dL (ref 70–99)
Glucose-Capillary: 94 mg/dL (ref 70–99)

## 2021-04-16 LAB — CBC WITH DIFFERENTIAL/PLATELET
Abs Immature Granulocytes: 0.06 10*3/uL (ref 0.00–0.07)
Basophils Absolute: 0.1 10*3/uL (ref 0.0–0.1)
Basophils Relative: 1 %
Eosinophils Absolute: 0.3 10*3/uL (ref 0.0–0.5)
Eosinophils Relative: 3 %
HCT: 25.3 % — ABNORMAL LOW (ref 36.0–46.0)
Hemoglobin: 7.9 g/dL — ABNORMAL LOW (ref 12.0–15.0)
Immature Granulocytes: 1 %
Lymphocytes Relative: 13 %
Lymphs Abs: 1.4 10*3/uL (ref 0.7–4.0)
MCH: 31.6 pg (ref 26.0–34.0)
MCHC: 31.2 g/dL (ref 30.0–36.0)
MCV: 101.2 fL — ABNORMAL HIGH (ref 80.0–100.0)
Monocytes Absolute: 1 10*3/uL (ref 0.1–1.0)
Monocytes Relative: 10 %
Neutro Abs: 7.7 10*3/uL (ref 1.7–7.7)
Neutrophils Relative %: 72 %
Platelets: 530 10*3/uL — ABNORMAL HIGH (ref 150–400)
RBC: 2.5 MIL/uL — ABNORMAL LOW (ref 3.87–5.11)
RDW: 17.2 % — ABNORMAL HIGH (ref 11.5–15.5)
WBC: 10.4 10*3/uL (ref 4.0–10.5)
nRBC: 0.2 % (ref 0.0–0.2)

## 2021-04-16 LAB — COMPREHENSIVE METABOLIC PANEL
ALT: 28 U/L (ref 0–44)
AST: 22 U/L (ref 15–41)
Albumin: 2.1 g/dL — ABNORMAL LOW (ref 3.5–5.0)
Alkaline Phosphatase: 166 U/L — ABNORMAL HIGH (ref 38–126)
Anion gap: 10 (ref 5–15)
BUN: 23 mg/dL — ABNORMAL HIGH (ref 6–20)
CO2: 25 mmol/L (ref 22–32)
Calcium: 8.2 mg/dL — ABNORMAL LOW (ref 8.9–10.3)
Chloride: 105 mmol/L (ref 98–111)
Creatinine, Ser: 2.14 mg/dL — ABNORMAL HIGH (ref 0.44–1.00)
GFR, Estimated: 26 mL/min — ABNORMAL LOW (ref >60)
Glucose, Bld: 103 mg/dL — ABNORMAL HIGH (ref 70–99)
Potassium: 4.8 mmol/L (ref 3.5–5.1)
Sodium: 140 mmol/L (ref 135–145)
Total Bilirubin: 0.7 mg/dL (ref 0.3–1.2)
Total Protein: 6.3 g/dL — ABNORMAL LOW (ref 6.5–8.1)

## 2021-04-16 NOTE — Progress Notes (Signed)
Inpatient [facility] Individual Statement of Services  Patient Name:  Dorothy Harris  Date:  04/16/2021  Welcome to the [facility].  Our goal is to provide you with an individualized program based on your diagnosis and situation, designed to meet your specific needs.  With this comprehensive rehabilitation program, you will be expected to participate in at least 3 hours of rehabilitation therapies Monday-Friday, with modified therapy programming on the weekends.  Your rehabilitation program will include the following services:  Physical Therapy (PT), Occupational Therapy (OT), Speech Therapy (ST), 24 hour per day rehabilitation nursing, Therapeutic Recreaction (TR), Neuropsychology, Care Coordinator, Rehabilitation Medicine, Nutrition Services, and Pharmacy Services  Weekly team conferences will be held on Tuesday to discuss your progress.  Your Inpatient Rehabilitation Care Coordinator will talk with you frequently to get your input and to update you on team discussions.  Team conferences with you and your family in attendance may also be held.  Expected length of stay: 7-10 DAYS  Overall anticipated outcome: MOD/I-SUPERVISION LEVEL  Depending on your progress and recovery, your program may change. Your Inpatient Rehabilitation Care Coordinator will coordinate services and will keep you informed of any changes. Your Inpatient Rehabilitation Care Coordinator's name and contact numbers are listed  below.  The following services may also be recommended but are not provided by the [facility] will be made to provide these services after discharge if needed.  Arrangements include referral to agencies that provide these services.  Your insurance has been verified to be:  [employer] Your primary doctor is:   None  Pertinent information will be shared with your doctor and your insurance company.  Inpatient Rehabilitation Care Coordinator:  Ovidio Kin, [place] or Emilia Beck  Information discussed with and copy given to patient by: Elease Hashimoto, 04/16/2021, 9:24 AM

## 2021-04-16 NOTE — Discharge Instructions (Addendum)
Inpatient Rehab Discharge Instructions  Dorothy Harris Discharge date and time: No discharge date for patient encounter.   Activities/Precautions/ Functional Status: Activity: activity as tolerated Diet: regular diet Wound Care: Routine skin checks Functional status:  ___ No restrictions     ___ Walk up steps independently ___ 24/7 supervision/assistance   ___ Walk up steps with assistance ___ Intermittent supervision/assistance  ___ Bathe/dress independently ___ Walk with walker     _x__ Bathe/dress with assistance ___ Walk Independently    ___ Shower independently ___ Walk with assistance    ___ Shower with assistance ___ No alcohol     ___ Return to work/school ________  Special Instructions: No driving smoking or alcohol  Routine colostomy care  Wet-to-dry dressings twice daily to abdominal wound    COMMUNITY REFERRALS UPON DISCHARGE:  Home Health:   PT & RN                 Agency: [facility]   Phone: [phone]  Medical Equipment/Items Ordered: Vassie Moselle & 3 IN 1                                                 Agency/Supplier:[facility]   [phone]  [facility] [employer]: [phone] CALL 24 HOURS IN ADVANCE TO SET UP RIDE TO FOLLOW UP APPOINTMENTS APPLY FOR SOCIAL SECURITY DISABILITY AND MEDICAID  [facility]- [phone] WILL CALL FOR FOLLOW UP APPOINTMENT-REFERRAL SENT [facility]-[phone] ONCE APPROVED WILL REACH OUT TO PATIENT TO ARRANGE APPOINTMENT    My questions have been answered and I understand these instructions. I will adhere to these goals and the provided educational materials after my discharge from the hospital.  Patient/Caregiver Signature _______________________________ Date __________  Clinician Signature _______________________________________ Date __________  Please bring this form and your medication list with you to all your follow-up doctor's appointments.

## 2021-04-16 NOTE — Patient Care Conference (Signed)
Inpatient RehabilitationTeam Conference and Plan of Care Update Date: 04/16/2021   Time: 12:05 PM    Patient Name: Dorothy Harris      Medical Record Number: [id number]  Date of Birth: 29-Sep-1960 Sex: Female         Room/Bed: 4W17C/4W17C-01 Payor Info: Payor: [employer] / Plan: [employer] NEXT OON / Product Type: *No Product type* /    Admit Date/Time:  04/15/2021  4:15 PM  Primary Diagnosis:  Debility  Hospital Problems: Principal Problem:   Debility    Expected Discharge Date: Expected Discharge Date:  (evals pending; week to 10 days)  Team Members Present: Physician leading conference: Dr. 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   Continent of bladder and able to manage ostomy   Education on ostomy care, practice hands on management  Swallow/Nutrition/ Hydration             ADL's   (per care tool and eval) Supervision UB, Min A LB bathe/dress, Mod A transfers  eval pending  eval pending   Mobility             Communication             Safety/Cognition/ Behavioral Observations            Pain     Pain med ordered; encourage use as needed   Pain managed with prn meds   Monitor need for and effectiveness of prn medications  Skin     New iliostomy, sternal wound dressing and abdominal wound vac   Ostomy care, sternal wound care and abd dressing care managed   Ostomy care educ, sternal wound care dressing educ and determine home with [facility] or dressing change education with the patient/caregiver    Discharge Planning:  New evaluation today-lives alone unsure if will have support at DC. Will confirm with pt today   Team Discussion: Evals pending. Patient is apathetic, flat affect.   Patient on target to meet rehab goals: Evaluations pending; currently supervision for upper body care and min mod assist for lower body. Needs mod assist for transfers with mod I goals set for discharge.  *See Care Plan and progress notes for long and short-term goals.   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  Barriers to Discharge: Behavior;Medical stability   Possible Resolutions to [employer]: daily assessment of labs, wounds, pain. adjusting plan as needed   Continued Need for Acute Rehabilitation Level of Care: The patient requires daily medical management by a physician with specialized training in physical medicine and rehabilitation for the following reasons: Direction of a multidisciplinary physical rehabilitation program to maximize functional independence : Yes Medical management of patient stability for increased activity during participation in an intensive rehabilitation regime.: Yes Analysis of laboratory values and/or radiology reports with any subsequent need for medication adjustment and/or medical intervention. : Yes  I attest that I was present, lead the team conference, and concur with the assessment and plan of the team.   Dorien Chihuahua B 04/16/2021, 12:08 PM
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
## 2021-04-16 NOTE — Progress Notes (Signed)
Patient ID: Dorothy Harris, female   DOB: 07/02/1960, 61 y.o.   MRN: [id number] Met with the patient to introduce self, role and review rehab process, team conference and plan of care. Discussed self care with patient regarding dressing change and ostomy care. Reviewed dietary modification recommendations and management of HTN, HLD and AKI/CKD. Patient without PCP; noted her sister had her insurance information to contact for ID of MD in-network. Sister lives out of town and aunt in [place] works. Patient with flat affect and is apathetic; dis-engaged in own care; wanted staff to order meals. Encouraged patient to review the menu and identify foods she liked and to take on a more active role in self care, notify staff of needs.Continue to follow along to discharge to address educational needs and facilitate preparation for discharge. Margarito Liner

## 2021-04-16 NOTE — Progress Notes (Signed)
Provided patient teaching/demonstration on how to empty ileostomy and how often it should be changed. Patient verbalized understanding on steps and assisted with cleaning after emptying ileostomy bag. Patient taught back that her ileostomy should be changed Q 3 days. Notified oncoming nurse of education provided.   Yehuda Mao, LPN

## 2021-04-16 NOTE — Plan of Care (Signed)
°  Problem: RH Balance Goal: LTG Patient will maintain dynamic standing with ADLs (OT) Description: LTG:  Patient will maintain dynamic standing balance with assist during activities of daily living (OT)  Flowsheets (Taken 04/16/2021 1240) LTG: Pt will maintain dynamic standing balance during ADLs with: Independent with assistive device   Problem: Sit to Stand Goal: LTG:  Patient will perform sit to stand in prep for activites of daily living with assistance level (OT) Description: LTG:  Patient will perform sit to stand in prep for activites of daily living with assistance level (OT) Flowsheets (Taken 04/16/2021 1240) LTG: PT will perform sit to stand in prep for activites of daily living with assistance level: Independent with assistive device   Problem: RH Grooming Goal: LTG Patient will perform grooming w/assist,cues/equip (OT) Description: LTG: Patient will perform grooming with assist, with/without cues using equipment (OT) Flowsheets (Taken 04/16/2021 1240) LTG: Pt will perform grooming with assistance level of: Independent with assistive device    Problem: RH Bathing Goal: LTG Patient will bathe all body parts with assist levels (OT) Description: LTG: Patient will bathe all body parts with assist levels (OT) Flowsheets (Taken 04/16/2021 1240) LTG: Pt will perform bathing with assistance level/cueing: Independent with assistive device    Problem: RH Dressing Goal: LTG Patient will perform upper body dressing (OT) Description: LTG Patient will perform upper body dressing with assist, with/without cues (OT). Flowsheets (Taken 04/16/2021 1240) LTG: Pt will perform upper body dressing with assistance level of: Independent with assistive device Goal: LTG Patient will perform lower body dressing w/assist (OT) Description: LTG: Patient will perform lower body dressing with assist, with/without cues in positioning using equipment (OT) Flowsheets (Taken 04/16/2021 1240) LTG: Pt will perform  lower body dressing with assistance level of: Independent with assistive device   Problem: RH Toileting Goal: LTG Patient will perform toileting task (3/3 steps) with assistance level (OT) Description: LTG: Patient will perform toileting task (3/3 steps) with assistance level (OT)  Flowsheets (Taken 04/16/2021 1240) LTG: Pt will perform toileting task (3/3 steps) with assistance level: Independent with assistive device   Problem: RH Simple Meal Prep Goal: LTG Patient will perform simple meal prep w/assist (OT) Description: LTG: Patient will perform simple meal prep with assistance, with/without cues (OT). Flowsheets (Taken 04/16/2021 1240) LTG: Pt will perform simple meal prep with assistance level of: Independent with assistive device   Problem: RH Light Housekeeping Goal: LTG Patient will perform light housekeeping w/assist (OT) Description: LTG: Patient will perform light housekeeping with assistance, with/without cues (OT). Flowsheets (Taken 04/16/2021 1240) LTG: Pt will perform light housekeeping with assistance level of: Independent with assistive device   Problem: RH Toilet Transfers Goal: LTG Patient will perform toilet transfers w/assist (OT) Description: LTG: Patient will perform toilet transfers with assist, with/without cues using equipment (OT) Flowsheets (Taken 04/16/2021 1240) LTG: Pt will perform toilet transfers with assistance level of: Independent with assistive device   Problem: RH Tub/Shower Transfers Goal: LTG Patient will perform tub/shower transfers w/assist (OT) Description: LTG: Patient will perform tub/shower transfers with assist, with/without cues using equipment (OT) Flowsheets (Taken 04/16/2021 1240) LTG: Pt will perform tub/shower stall transfers with assistance level of: Independent with assistive device

## 2021-04-16 NOTE — Progress Notes (Signed)
Inpatient Rehabilitation Admission Medication Review by a Pharmacist (updated version)  A complete drug regimen review was completed for this patient to identify any potential clinically significant medication issues.  High Risk Drug Classes Is patient taking? Indication by Medication  Antipsychotic Yes Compazine for nausea  Anticoagulant Yes SQ heparin for VTE prophx.  Antibiotic No   Opioid Yes Oxycodone for pain (s/p) aortic dissection repair and abdom. surgery with open abd wound   Antiplatelet Yes ASA $Remov'324mg'DqSeCg$ /d for CAD   Hypoglycemics/insulin Yes SSI for hyperglycemia (A1C 5.9)  Vasoactive Medication Yes Metoprolol, Amio for afib, CHF, HTN  Chemotherapy No   Other No      Clinically significant medication issues were identified that warrant physician communication and completion of prescribed/recommended actions by midnight of the next day:  No  Time spent performing this drug regimen review (minutes):  15 min  Emmeline Winebarger S. Alford Highland, PharmD, BCPS Clinical Staff Pharmacist [email or web address] [place] 04/16/2021 8:41 AM
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
## 2021-04-16 NOTE — Progress Notes (Signed)
Physical Therapy Session Note  Patient Details  Name: SHAWNIA VIZCARRONDO MRN: [id number] Date of Birth: May 31, 1960  Today's Date: 04/16/2021 PT Missed Time: 58 Minutes Missed Time Reason: Patient fatigue;Patient unwilling to participate  Short Term Goals: Week 1:     Skilled Therapeutic Interventions/Progress Updates:  Pt received supine in bed, very lethargic w/flat affect. Pt denied pain but reported significant fatigue. Per OT, pt desaturated during OT session to 79% on RA with very little activity. SpO2 in supine on RA at 90%, educated pt on potential need for O2 supplementation to improve participation in therapy. Pt refused to participate due to fatigue and sessions "not being spaced out enough". Pt was left supine in bed, all needs in reach. Care team notified of desaturation. Missed 45 minutes of skilled PT due to pt fatigue/refusal.   Therapy Documentation Precautions:  Restrictions Weight Bearing Restrictions: No RUE Weight Bearing: Non weight bearing   Therapy/Group: Individual Therapy Cruzita Lederer Aziah Brostrom, PT, DPT  04/16/2021, 7:55 AM

## 2021-04-17 DIAGNOSIS — R5381 Other malaise: Secondary | ICD-10-CM | POA: Diagnosis not present

## 2021-04-17 LAB — GLUCOSE, CAPILLARY
Glucose-Capillary: 107 mg/dL — ABNORMAL HIGH (ref 70–99)
Glucose-Capillary: 105 mg/dL — ABNORMAL HIGH (ref 70–99)
Glucose-Capillary: 91 mg/dL (ref 70–99)

## 2021-04-17 NOTE — Plan of Care (Signed)
°  Problem: RH Furniture Transfers Goal: LTG Patient will perform furniture transfers w/assist (OT/PT) Description: LTG: Patient will perform furniture transfers  with assistance (OT/PT). Flowsheets (Taken 04/17/2021 0707) LTG: Pt will perform furniture transfers with assist:: Independent with assistive device    Problem: RH Balance Goal: LTG Patient will maintain dynamic sitting balance (PT) Description: LTG:  Patient will maintain dynamic sitting balance with assistance during mobility activities (PT) Flowsheets (Taken 04/17/2021 0707) LTG: Pt will maintain dynamic sitting balance during mobility activities with:: Independent Goal: LTG Patient will maintain dynamic standing balance (PT) Description: LTG:  Patient will maintain dynamic standing balance with assistance during mobility activities (PT) Flowsheets (Taken 04/17/2021 0707) LTG: Pt will maintain dynamic standing balance during mobility activities with:: Independent with assistive device    Problem: Sit to Stand Goal: LTG:  Patient will perform sit to stand with assistance level (PT) Description: LTG:  Patient will perform sit to stand with assistance level (PT) Flowsheets (Taken 04/17/2021 0707) LTG: PT will perform sit to stand in preparation for functional mobility with assistance level: Independent with assistive device   Problem: RH Bed Mobility Goal: LTG Patient will perform bed mobility with assist (PT) Description: LTG: Patient will perform bed mobility with assistance, with/without cues (PT). Flowsheets (Taken 04/17/2021 0707) LTG: Pt will perform bed mobility with assistance level of: Independent with assistive device    Problem: RH Bed to Chair Transfers Goal: LTG Patient will perform bed/chair transfers w/assist (PT) Description: LTG: Patient will perform bed to chair transfers with assistance (PT). Flowsheets (Taken 04/17/2021 0707) LTG: Pt will perform Bed to Chair Transfers with assistance level: Independent with  assistive device    Problem: RH Car Transfers Goal: LTG Patient will perform car transfers with assist (PT) Description: LTG: Patient will perform car transfers with assistance (PT). Flowsheets (Taken 04/17/2021 0707) LTG: Pt will perform car transfers with assist:: Supervision/Verbal cueing   Problem: RH Ambulation Goal: LTG Patient will ambulate in controlled environment (PT) Description: LTG: Patient will ambulate in a controlled environment, # of feet with assistance (PT). Flowsheets (Taken 04/17/2021 0707) LTG: Pt will ambulate in controlled environ  assist needed:: Supervision/Verbal cueing LTG: Ambulation distance in controlled environment: up to 80 ft using LRAD Goal: LTG Patient will ambulate in home environment (PT) Description: LTG: Patient will ambulate in home environment, # of feet with assistance (PT). Flowsheets (Taken 04/17/2021 0707) LTG: Pt will ambulate in home environ  assist needed:: Independent with assistive device LTG: Ambulation distance in home environment: up to 50 ft using LRAD and managing all obstacles   Problem: RH Stairs Goal: LTG Patient will ambulate up and down stairs w/assist (PT) Description: LTG: Patient will ambulate up and down # of stairs with assistance (PT) Flowsheets (Taken 04/17/2021 0707) LTG: Pt will ambulate up/down stairs assist needed:: Supervision/Verbal cueing LTG: Pt will  ambulate up and down number of stairs: at least 3 steps using HR setup as per home environment in order to enter/ exit home

## 2021-04-17 NOTE — Progress Notes (Signed)
Physical Therapy Session Note  Patient Details  Name: Dorothy Harris MRN: [id number] Date of Birth: [birth year]  Today's Date: 04/17/2021 PT Individual Time: 8069-9967 PT Individual Time Calculation (min): 24 min   Short Term Goals: Week 1:  PT Short Term Goal 1 (Week 1): STG = LTG d/t ELOS   Skilled Therapeutic Interventions/Progress Updates:   Pt received supine in bed and agreeable to PT with encouragement at bed level only. PT obtained bolster. Pt performed scooting in bed with supervision assist and bolster placed. SAQ, and ankle pumps 2 x 10. Pt noted to be sliding to foot of bed. Bed lowered to flat and pt encouraged to perform additional scoot, but requesting assist. When PT attempted to assist, pt declined to participate in scoot, despite having performed prior to therex. PT encouraged pt to assist with scooting to [facility], but pt refused unless additional staff present. When PT continued to encourage scoot in bed, pt States, "now I'm getting irritated... Im done." Pt refused to perform reposition or additional physical activity in bed, and  requested to stay in full supine. PT left pt in bed with all needs met. and RN aware.       Therapy Documentation Precautions:  Precautions Precautions: Fall, Sternal Precaution Booklet Issued: Yes (comment) Precaution Comments: monitor O2, HR; abdominal wound vac, colostomy Restrictions Weight Bearing Restrictions: No RUE Weight Bearing: Non weight bearing General:   Vital Signs: Therapy Vitals Temp: 98.6 F (37 C) Temp Source: Oral Pulse Rate: 73 Resp: 16 BP: 138/78 Patient Position (if appropriate): Lying Oxygen Therapy SpO2: 94 % O2 Device: Room Air Pain:   5/10. Low back ache. Pt repositioned to supine. Mild improvement.     Therapy/Group: Individual Therapy  Lorie Phenix 04/17/2021, 11:43 PM

## 2021-04-17 NOTE — Progress Notes (Signed)
Physical Therapy Session Note ° °Patient Details  °Name: [name] °MRN: [id number] °Date of Birth: 03/19/1961 ° °Today's Date: 04/17/2021 °PT Individual Time: 0915-0955 °PT Individual Time Calculation (min): 40 min  ° °Short Term Goals: °Week 1:  PT Short Term Goal 1 (Week 1): STG = LTG d/t ELOS ° °Skilled Therapeutic Interventions/Progress Updates: Pt presented in bed agreeable to therapy. Pt states pain 2/10 and no pain behaviors noted throughout session. Performed bed mobility with supervision and use of bed features. Although pt reaching outside of "tube" for bed rail despite cues. Performed ambulatory transfer to w/c with RW and CGA and transported to day room. SpO2 checked prior to activity 90%, pt ambulated ~75ft with RW and CGA with w/c follow due to wound vac. SpO2 dropped to 88% then 86% after ambulation on RA. Pt required >4min for recovery with nsg. Notified nsg and palced on 1L supplemental O2. Pt then ambulated additional 75ft with RW and supervision due to PTA managing w/c and O2. SpO2 after ambulation 90%. Participated in Cybex Kinetron 50cm/sec x 2 min with SpO2 checked after at 93%. Pt transported back to room and agreeable to remain in w/c  as next session in ~15min. Pt left in chair with call bell within reach and needs met.  ° ° °Therapy Documentation °Precautions:  °Precautions °Precautions: Fall, Sternal °Precaution Booklet Issued: Yes (comment) °Precaution Comments: monitor O2, HR; abdominal wound vac, colostomy °Restrictions °Weight Bearing Restrictions: No °RUE Weight Bearing: Non weight bearing °General: °  °Vital Signs: °Therapy Vitals °Temp: 98.1 °F (36.7 °C) °Temp Source: Oral °Pulse Rate: 78 °Resp: 19 °BP: (!) 145/86 °Patient Position (if appropriate): Lying °Oxygen Therapy °SpO2: 98 % °O2 Device: Room Air °Pain: °  °Mobility: °  °Locomotion : °   °Trunk/Postural Assessment : °   °Balance: °  °Exercises: °  °Other Treatments:   ° ° ° °Therapy/Group: Individual Therapy ° °Rosita  DeChalus °04/17/2021, 12:53 PM  °

## 2021-04-17 NOTE — Progress Notes (Signed)
PROGRESS NOTE   Subjective/Complaints:  No issues overnite , has O2 off no dyspnea   ROS- neg CP, SOB , N/V/D  Objective:   No results found. Recent Labs    04/15/21 1636 04/16/21 0524  WBC 12.2* 10.4  HGB 8.2* 7.9*  HCT 26.5* 25.3*  PLT 541* 530*   Recent Labs    04/15/21 1636 04/16/21 0524  NA 138 140  K 4.7 4.8  CL 103 105  CO2 27 25  GLUCOSE 109* 103*  BUN 23* 23*  CREATININE 2.27* 2.14*  CALCIUM 8.6* 8.2*    Intake/Output Summary (Last 24 hours) at 04/17/2021 1143 Last data filed at 04/16/2021 1839 Gross per 24 hour  Intake 360 ml  Output --  Net 360 ml        Physical Exam: Vital Signs Blood pressure 132/82, pulse 81, temperature 98.9 F (37.2 C), resp. rate 16, height $RemoveBe'5\' 7"'pztOdvRMq$  (1.702 m), weight 107 kg, SpO2 94 %.   General: No acute distress Mood and affect are appropriate Heart: Regular rate and rhythm no rubs murmurs or extra sounds Lungs: Clear to auscultation, breathing unlabored, no rales or wheezes Abdomen: Positive bowel sounds, soft nontender to palpation, nondistended, + ileostomy, Midline abd wound vac  Extremities: No clubbing, cyanosis, or edema Skin: No evidence of breakdown, no evidence of rash Neurologic: Cranial nerves II through XII intact, motor strength is 4/5 in bilateral deltoid, bicep, tricep, grip, hip flexor, knee extensors, ankle dorsiflexor and plantar flexor Sensory exam normal sensation to light touch and proprioception in bilateral upper and lower extremities Cerebellar exam normal finger to nose to finger as well as heel to shin in bilateral upper and lower extremities Musculoskeletal: Full range of motion in all 4 extremities. No joint swelling   Assessment/Plan: 1. Functional deficits which require 3+ hours per day of interdisciplinary therapy in a comprehensive inpatient rehab setting. Physiatrist is providing close team supervision and 24 hour management of  active medical problems listed below. Physiatrist and rehab team continue to assess barriers to discharge/monitor patient progress toward functional and medical goals  Care Tool:  Bathing    Body parts bathed by patient: Face         Bathing assist Assist Level: Supervision/Verbal cueing     Upper Body Dressing/Undressing Upper body dressing   What is the patient wearing?: Pull over shirt, Hospital gown only    Upper body assist Assist Level: Supervision/Verbal cueing    Lower Body Dressing/Undressing Lower body dressing      What is the patient wearing?: Underwear/pull up, Pants     Lower body assist Assist for lower body dressing: Minimal Assistance - Patient > 75%     Toileting Toileting Toileting Activity did not occur (Clothing management and hygiene only): N/A (no void or bm)  Toileting assist Assist for toileting: Contact Guard/Touching assist     Transfers Chair/bed transfer  Transfers assist     Chair/bed transfer assist level: Minimal Assistance - Patient > 75%     Locomotion Ambulation   Ambulation assist      Assist level: Contact Guard/Touching assist Assistive device: Walker-rolling Max distance: 40 ft   Walk 10 feet activity  Assist     Assist level: Contact Guard/Touching assist Assistive device: Walker-rolling   Walk 50 feet activity   Assist Walk 50 feet with 2 turns activity did not occur: Safety/medical concerns         Walk 150 feet activity   Assist Walk 150 feet activity did not occur: Safety/medical concerns         Walk 10 feet on uneven surface  activity   Assist Walk 10 feet on uneven surfaces activity did not occur: Safety/medical concerns         Wheelchair     Assist Is the patient using a wheelchair?: No (prohibited by sternal precautions)   Wheelchair activity did not occur: Safety/medical concerns         Wheelchair 50 feet with 2 turns activity    Assist    Wheelchair  50 feet with 2 turns activity did not occur: Safety/medical concerns       Wheelchair 150 feet activity     Assist  Wheelchair 150 feet activity did not occur: Safety/medical concerns       Blood pressure 132/82, pulse 81, temperature 98.9 F (37.2 C), resp. rate 16, height $RemoveBe'5\' 7"'lBpMtaYlN$  (1.702 m), weight 107 kg, SpO2 94 %.  Medical Problem List and Plan: 1. 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  Routine ostomy care as well as wound VAC changes             -patient may not shower             -ELOS/Goals: 7-10d  2.  Antithrombotics: -DVT/anticoagulation:  Pharmaceutical: Heparin             -antiplatelet therapy: Aspirin 324 mg daily 3. Pain Management: Robaxin and oxycodone as needed 4. Mood: Provide emotional support             -antipsychotic agents: N/A 5. Neuropsych: This patient is capable of making decisions on her own behalf. 6. Skin/Wound Care: Routine skin checks 7. Fluids/Electrolytes/Nutrition: Routine in and outs with follow-up chemistries 8.  Acute blood loss anemia.  Multiple transfusions/FFP.  Follow-up CBC stable 7.9 9.  A. fib/RVR 04/06/2021.  Continue amiodarone 200 mg daily/Lopressor 12.5 mg twice daily.  Cardiac rate controlled.  Not an anticoagulation candidate at this time. 10.  Acute on chronic diastolic congestive heart failure.  Monitor for any signs of fluid overload.  Lasix adjusted 40 mg Monday Wednesday Friday due to AKI 11.  AKI/CKD stage III.  Follow-up chemistries 12.  Obesity.  BMI 37.91.  Dietary follow-up                 LOS: 2 days A FACE TO FACE EVALUATION WAS PERFORMED  Dorothy Harris 04/17/2021, 11:43 AM

## 2021-04-17 NOTE — Progress Notes (Signed)
Occupational Therapy Session Note  Patient Details  Name: Dorothy Harris MRN: [id number] Date of Birth: Oct 25, 1960  Today's Date: 04/17/2021 OT Individual Time: 1000-1100 OT Individual Time Calculation (min): 60 min    Short Term Goals: Week 1:  OT Short Term Goal 1 (Week 1): STG = LTG 2/2 ELOS Week 2:    Week 3:     Skilled Therapeutic Interventions/Progress Updates:    Patient seated at w/c LOF upon arrival.  Patient completed BADL related task in bathing at sink level. Patient able to doff UB/UB clothing items with MinA for LB clothing items. Patient completed UB bating with s/u assist, the pt required ModA for LB bathing of her lower leg, feet, and buttocks.  The pt was MinA for LB dressing.  The pt was instructed in relaxation breathing and was encouraged to take rest breaks a needed, the pt required 2 rest breaks. The pt was able to brush her teeth with s/u  assist. The pt was instructed in home management techniques to enhance her transition.  The pt completed light UB exercise, using a 2lb dumb bell for bicep curls, shld flexion, and horizontal abduction, 2 sets of 10 with breaks as needed.  The pt had no c/o pain and remained at w/c LOF with O2 available as needed.  The pt had an O2 saturation of 94 prior to tx and maintained 97% during this tx session.  The pt's call light was in place with all addition needs addressed.   Therapy Documentation Precautions:  Precautions Precautions: Fall, Sternal Precaution Booklet Issued: Yes (comment) Precaution Comments: monitor O2, HR; abdominal wound vac, colostomy Restrictions Weight Bearing Restrictions: No RUE Weight Bearing: Non weight bearing General:   Vital Signs:   Pain: Pain Assessment Pain Score: 2   Vision   Perception    Praxis Praxis: Intact Balance   Exercises:   Other Treatments:     Therapy/Group: Individual Therapy  Yvonne Kendall 04/17/2021, 11:09 AM

## 2021-04-17 NOTE — Consult Note (Addendum)
[place] Nurse wound follow up Patient receiving care in [facility] 4W17 Wound type: Surgical midline abdominal  Measurement: 22 cm x 6 cm x 3 cm Wound bed: Pink /red granulation tissue with small amount of  grey tissue  Drainage (amount, consistency, odor) serosanguinous in the canister Periwound: intact Dressing procedure/placement/frequency: Removed one piece of black foam. Replaced one piece of black foam. Drape applied. Immediate seal obtained at 125 mmHg   Dressing kits ordered to be placed in top drawer in room   [place] Nurse ostomy follow up Stoma type/location: RLQ ileostomy Stomal assessment/size: 1 1/2" pink oval just above skin level with sutures intact  Peristomal assessment: intact Treatment options for stomal/peristomal skin: barrier ring Output: mushy dark brown Ostomy pouching: 2pc. 2 1/4" Education provided:  None today. She was getting ready to eat breakfast and agreed to let us do the dressing change before she ate. Will place orders for OT/PT/RN/LPN to work with her emptying and changing pouch   I recommend [facility] outpatient ostomy clinic as an outpatient resource.  IF MD agrees this would be beneficial, please fax referral, or enter electronically in Epic the referral. (Fax- [phone])  Enrolled patient in [facility] Start Discharge program: Yes   WOC to follow M/W/F for vac changes and ostomy education and teaching.   Cathlean Marseilles Tamala Julian, MSN, RN, [place], Lysle Pearl, [facility] Wound Treatment Associate Pager [phone]

## 2021-04-17 NOTE — Progress Notes (Signed)
Occupational Therapy Session Note  Patient Details  Name: KAMMI HECHLER MRN: [id number] Date of Birth: 1960/06/17  Today's Date: 04/17/2021 OT Individual Time: 1400-1439 OT Individual Time Calculation (min): 39 min    Short Term Goals: Week 1:  OT Short Term Goal 1 (Week 1): STG = LTG 2/2 ELOS   Skilled Therapeutic Interventions/Progress Updates:    Pt greeted at time of session semireclined bed level, ADL needs met and declined needing to toilet. Pt fatigued but agreeable to attempt OT session. Reviewed sternal precautions throughout with limited compliance despite cues. Bed mobility supine > sit Supervision and stand pivot to wheelchair same manner with RW. OT assist managing wound vac throughout transfers. Transported to ortho gym and focused on sit <> stands x5 trials within sternal precautions, Supervision throughout. 1 round of standing horseshoe toss with focus on keeping arms at side, HR at 89 and O2 sats 92% after activity, no SOB reported. Dynamic standing round at [facility] for letters A-Z with cues to only hit numbers within "tube" and keep arms grossly at side. Back in room, walked short distance to bed and sit > supine with cues for precautions with supervision. Call bell in reach.   Therapy Documentation Precautions:  Precautions Precautions: Fall, Sternal Precaution Booklet Issued: Yes (comment) Precaution Comments: monitor O2, HR; abdominal wound vac, colostomy Restrictions Weight Bearing Restrictions: No RUE Weight Bearing: Non weight bearing     Therapy/Group: Individual Therapy  Viona Gilmore 04/17/2021, 7:27 AM
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
## 2021-04-18 DIAGNOSIS — R5381 Other malaise: Secondary | ICD-10-CM | POA: Diagnosis not present

## 2021-04-18 LAB — GLUCOSE, CAPILLARY
Glucose-Capillary: 101 mg/dL — ABNORMAL HIGH (ref 70–99)
Glucose-Capillary: 102 mg/dL — ABNORMAL HIGH (ref 70–99)
Glucose-Capillary: 110 mg/dL — ABNORMAL HIGH (ref 70–99)
Glucose-Capillary: 141 mg/dL — ABNORMAL HIGH (ref 70–99)

## 2021-04-18 NOTE — IPOC Note (Signed)
Overall Plan of Care [facility]) Patient Details Name: Dorothy Harris MRN: [id number] DOB: January 06, 1961  Admitting Diagnosis: [facility] Problems: Principal Problem:   Debility     Functional Problem List: Nursing Medication Management, Safety, Pain, Endurance, Bowel, Bladder  PT Balance, Endurance, Motor, Pain, Safety, Sensory, Skin Integrity  OT Balance, Skin Integrity, Endurance, Motor, Pain  SLP    TR         Basic ADLs: OT Grooming, Bathing, Dressing, Toileting     Advanced  ADLs: OT Simple Meal Preparation, Light Housekeeping     Transfers: PT Bed Mobility, Bed to Chair, Car, Manufacturing systems engineer, Metallurgist: PT Ambulation, Stairs     Additional Impairments: OT None  SLP        TR      Anticipated Outcomes Item Anticipated Outcome  Self Feeding ind  Swallowing      Basic self-care  mod I  Toileting  mod I   Bathroom Transfers mod I  Bowel/Bladder  Manage bowel w mod I assist  Transfers  Mod I  Locomotion  Mod I  Communication     Cognition     Pain  Pain at or below level 4 with prn meds  Safety/Judgment  manage safety with cues   Therapy Plan: PT Intensity: Minimum of 1-2 x/day ,45 to 90 minutes PT Frequency: 5 out of 7 days PT Duration Estimated Length of Stay: 7-10 days (Pt will need to be mod I on d/c) OT Intensity: Minimum of 1-2 x/day, 45 to 90 minutes OT Frequency: 5 out of 7 days OT Duration/Estimated Length of Stay: 7-10 days     Due to the current state of emergency, patients may not be receiving their 3-hours of Medicare-mandated therapy.   Team Interventions: Nursing Interventions Bladder Management, Patient/Family Education, Bowel Management, Disease Management/Prevention, Medication Management, Discharge Planning, Pain Management, Skin Care/Wound Management  PT interventions Ambulation/gait training, Community reintegration, DME/adaptive equipment instruction, Neuromuscular re-education, Psychosocial  support, Stair training, UE/LE Strength taining/ROM, Training and development officer, Disease management/prevention, Functional mobility training, Patient/family education, Therapeutic Exercise, Visual/perceptual remediation/compensation, UE/LE Coordination activities, Therapeutic Activities, Skin care/wound management, Pain management, Discharge planning  OT Interventions Balance/vestibular training, Disease mangement/prevention, Neuromuscular re-education, Self Care/advanced ADL retraining, Therapeutic Exercise, Wheelchair propulsion/positioning, UE/LE Strength taining/ROM, Skin care/wound managment, Pain management, DME/adaptive equipment instruction, Community reintegration, Barrister's clerk education, UE/LE Coordination activities, Therapeutic Activities, Psychosocial support, Functional mobility training, Discharge planning  SLP Interventions    TR Interventions    SW/CM Interventions Discharge Planning, Psychosocial Support, Patient/Family Education   Barriers to Discharge MD  Medical stability, Wound care, and Weight  Nursing Lack of/limited family support, Home environment access/layout, Wound Care, Incontinence, Weight bearing restrictions 1 level 3ste, right rail solo; friends and neighbors to assist intermittently  PT Inaccessible home environment, Decreased caregiver support, Home environment access/layout, Wound Care, Lack of/limited family support, Insurance underwriter for SNF coverage, Weight, Nutrition means    OT Decreased caregiver support, Home environment Child psychotherapist, Insurance for SNF coverage, Other (comments) sternal precautions  SLP      SW Lack of/limited family support, Insurance underwriter for SNF coverage, Decreased caregiver support, Medication compliance, Wound Care     Team Discharge Planning: Destination: PT-Home ,OT- Home , SLP-  Projected Follow-up: PT-Home health PT, OT-  Outpatient OT, SLP-  Projected Equipment Needs: PT-To be determined, OT- To be determined, SLP-  Equipment  Details: PT- , OT-  Patient/family involved in discharge planning: PT- Patient,  OT-Patient, SLP-   MD ELOS: 7-10d,  Mod I except will need help for dressing changes  Medical Rehab Prognosis:  Good Assessment: 61 year old right-handed female with history of hypertension, CKD stage III, obesity with BMI 37.91, tobacco use, hyperlipidemia, CAD/non-STEMI maintained on aspirin, 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  The ascending aorta was unremarkable.  She was subsequently lost to follow-up.   Per chart review lives alone independent prior to admission with sedentary lifestyle.  1 level home with 2 steps to entry.  Presented 03/24/2021 to [facility] with nonspecific substernal chest pain.  Denied any dizziness or headache.  Patient did receive 2 nitros.  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  The scan was repeated with intravenous contrast and clearly showed acute type A dissection beginning in aortic root and extending up into the aortic arch to meet up with previous type B dissection.  It did not appear to extend up into the arch vessels themselves.  Patient was transferred to [facility] under went median sternotomy with replacement of aortic arch under deep hypothermic circulatory arrest with bypass of the innominate, left common carotid and left subclavian arteries/supra coronary replacement of the ascending aorta 03/25/2021 per Dr. Caffie Pinto.  She did require 3 units packed red blood cells 2 units platelets during surgery and 4 FFP.  She was extubated 03/26/2021.  Hospital course she subsequently developed abdominal pain needing  to to the operating room 12/27.  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  Extended right hemicolectomy and ileostomy performed per Dr.Lovick and wound VAC applied.  She did require TPN for short time.  Acute blood loss anemia multiple packed red blood cells, cryo, FFP platelet transfusion with latest hemoglobin 7.8.  A. fib/RVR 12/31 intraoperative echocardiogram with ejection fraction 65% she did require amiodarone and was not a candidate for anticoagulation.  Acute on chronic diastolic congestive heart failure complicated by acute hypoxic respiratory failure she was weaned from oxygen therapy with diuresis as directed.  AKI 1.83-2.30 with Lasix therapy adjusted.  She was cleared to begin subcutaneous heparin for DVT prophylaxis 04/10/2021.  Her diet has been advanced to regular consistency    See Team Conference Notes for weekly updates to the plan of care

## 2021-04-18 NOTE — Progress Notes (Signed)
Occupational Therapy Session Note  Patient Details  Name: Dorothy Harris MRN: [id number] Date of Birth: 08-25-1960  Today's Date: 04/18/2021 OT Individual Time: 1115-1200 OT Individual Time Calculation (min): 45 min  and Today's Date: 04/18/2021 OT Missed Time: 15 Minutes Missed Time Reason: Patient fatigue   Short Term Goals: Week 1:  OT Short Term Goal 1 (Week 1): STG = LTG 2/2 ELOS  Skilled Therapeutic Interventions/Progress Updates:    Pt received in wc in room ready for therapy. She was agreeable to going to the gym to focus on LE strength needed for standing.   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   In gym worked on knee flex/ext AROM with rolling a soccer ball out and in, then added in light resistance with level 1 theraband.  With B legs extended, pt did hip abd/add, with 1 leg at a time, knee extension with isometric holds. Pt repeated all exercises about 20 x for 2 sets.  Worked towards sit to stands without using arms by trying sit to partial stand.  She needs significant A to lift hips and has quite a bit of weakness in R leg. She stated her legs were hurting from edema.  Pt was eventually able to lift hips partially but kept saying she was too tired to continue.  Negotiated with her if I could get 1 strong stand, we can stop early. Pt able to rise up with mod A and come into full stand but only for a few seconds.  Pt adjusted herself back in wc and stated she wanted to stay up for lunch.  Taken back to room, donned belt alarm and had all needs met.   Therapy Documentation Precautions:  Precautions Precautions: Fall, Sternal Precaution Booklet Issued: Yes (comment) Precaution Comments: monitor O2, HR; abdominal wound vac, colostomy Restrictions Weight Bearing Restrictions: No RUE Weight Bearing: Non weight bearing Pain: C/o leg pain due to edema ADL: ADL Eating: Set up Where Assessed-Eating: Bed level Grooming:  Supervision/safety Where Assessed-Grooming: Sitting at sink Upper Body Bathing: Supervision/safety Where Assessed-Upper Body Bathing: Sitting at sink Lower Body Bathing: Minimal assistance Where Assessed-Lower Body Bathing: Standing at sink Upper Body Dressing: Supervision/safety Where Assessed-Upper Body Dressing: Sitting at sink Lower Body Dressing: Minimal assistance Where Assessed-Lower Body Dressing: Standing at sink Toileting: Moderate assistance Where Assessed-Toileting: Toilet, Recruitment consultant Transfer: Moderate assistance Toilet Transfer Method: Ambulating Tub/Shower Transfer: Not assessed Social research officer, government: Not assessed  Therapy/Group: Individual Therapy  Charles Andringa 04/18/2021, 9:04 AM

## 2021-04-18 NOTE — Progress Notes (Signed)
Occupational Therapy Session Note  Patient Details  Name: Dorothy Harris MRN: [id number] Date of Birth: 12-31-1960  Today's Date: 04/18/2021 OT Individual Time: 1315-1350 OT Individual Time Calculation (min): 35 min    Short Term Goals: Week 1:  OT Short Term Goal 1 (Week 1): STG = LTG 2/2 ELOS  Skilled Therapeutic Interventions/Progress Updates:    Pt greeted semi-reclined in bed. Pt stated she had recently returned to bed and did not feel like getting up again. PT declined all BADL tasks. Pt reported some tenderness and swelling of left breast. L breast is considerably larger than right. OT placed kinesiotape in edema management pattern. Pt then completed bed level LB there-ex 3 sets of 10 knee extension, glute squeezes, and knee press. Pt left semi-reclined in bed with LE's supported on pillows, call bell in reach, and needs met.   Therapy Documentation Precautions:  Precautions Precautions: Fall, Sternal Precaution Booklet Issued: Yes (comment) Precaution Comments: monitor O2, HR; abdominal wound vac, colostomy Restrictions Weight Bearing Restrictions: No RUE Weight Bearing: Non weight bearing Pain:  Pt reports swelling and tenderness in left breast, repositioned and kinesiotape applied.  Therapy/Group: Individual Therapy  Valma Cava 04/18/2021, 1:56 PM

## 2021-04-18 NOTE — Progress Notes (Addendum)
PROGRESS NOTE   Subjective/Complaints:  Appreciate Gen Surgery note, discussed recs with pt  No breast pain,  ROS- neg CP, SOB , N/V/D  Objective:   No results found. Recent Labs    04/15/21 1636 04/16/21 0524  WBC 12.2* 10.4  HGB 8.2* 7.9*  HCT 26.5* 25.3*  PLT 541* 530*    Recent Labs    04/15/21 1636 04/16/21 0524  NA 138 140  K 4.7 4.8  CL 103 105  CO2 27 25  GLUCOSE 109* 103*  BUN 23* 23*  CREATININE 2.27* 2.14*  CALCIUM 8.6* 8.2*     Intake/Output Summary (Last 24 hours) at 04/18/2021 1009 Last data filed at 04/18/2021 0851 Gross per 24 hour  Intake 840 ml  Output 250 ml  Net 590 ml         Physical Exam: Vital Signs Blood pressure 135/82, pulse 77, temperature 98.8 F (37.1 C), temperature source Oral, resp. rate 16, height $RemoveBe'5\' 7"'[id number]$  (1.702 m), weight 107 kg, SpO2 94 %.   General: No acute distress Mood and affect are appropriate Heart: Regular rate and rhythm no rubs murmurs or extra sounds Lungs: Clear to auscultation, breathing unlabored, no rales or wheezes Abdomen: Positive bowel sounds, soft nontender to palpation, nondistended, + ileostomy, Midline abd wound vac  Extremities: No clubbing, cyanosis, or edema Skin: No evidence of breakdown, no evidence of rash Neurologic: Cranial nerves II through XII intact, motor strength is 4/5 in bilateral deltoid, bicep, tricep, grip, hip flexor, knee extensors, ankle dorsiflexor and plantar flexor Sensory exam normal sensation to light touch and proprioception in bilateral upper and lower extremities Cerebellar exam normal finger to nose to finger as well as heel to shin in bilateral upper and lower extremities Musculoskeletal: Full range of motion in all 4 extremities. No joint swelling Breasts- left breast enlarged vs Right . Induration , mild erythema around Left areola, no nipple d/c with expression, mild/mod tenderness in dependant portions of  breast, RIght side normal   Assessment/Plan: 1. Functional deficits which require 3+ hours per day of interdisciplinary therapy in a comprehensive inpatient rehab setting. Physiatrist is providing close team supervision and 24 hour management of active medical problems listed below. Physiatrist and rehab team continue to assess barriers to discharge/monitor patient progress toward functional and medical goals  Care Tool:  Bathing    Body parts bathed by patient: Face         Bathing assist Assist Level: Supervision/Verbal cueing     Upper Body Dressing/Undressing Upper body dressing   What is the patient wearing?: Pull over shirt, Hospital gown only    Upper body assist Assist Level: Contact Guard/Touching assist    Lower Body Dressing/Undressing Lower body dressing    Lower body dressing activity did not occur: N/A What is the patient wearing?: Underwear/pull up, Pants     Lower body assist Assist for lower body dressing: Minimal Assistance - Patient > 75%     Toileting Toileting Toileting Activity did not occur (Clothing management and hygiene only): N/A (no void or bm)  Toileting assist Assist for toileting: Contact Guard/Touching assist     Transfers Chair/bed transfer  Transfers assist  Chair/bed transfer assist level: Contact Guard/Touching assist     Locomotion Ambulation   Ambulation assist      Assist level: Contact Guard/Touching assist Assistive device: Walker-rolling Max distance: 40 ft   Walk 10 feet activity   Assist     Assist level: Contact Guard/Touching assist Assistive device: Walker-rolling   Walk 50 feet activity   Assist Walk 50 feet with 2 turns activity did not occur: Safety/medical concerns         Walk 150 feet activity   Assist Walk 150 feet activity did not occur: Safety/medical concerns         Walk 10 feet on uneven surface  activity   Assist Walk 10 feet on uneven surfaces activity did not  occur: Safety/medical concerns         Wheelchair     Assist Is the patient using a wheelchair?: No (prohibited by sternal precautions)   Wheelchair activity did not occur: Safety/medical concerns         Wheelchair 50 feet with 2 turns activity    Assist    Wheelchair 50 feet with 2 turns activity did not occur: Safety/medical concerns       Wheelchair 150 feet activity     Assist  Wheelchair 150 feet activity did not occur: Safety/medical concerns       Blood pressure 135/82, pulse 77, temperature 98.8 F (37.1 C), temperature source Oral, resp. rate 16, height $RemoveBe'5\' 7"'uJmMSzQwM$  (1.702 m), weight 107 kg, SpO2 94 %.  Medical Problem List and Plan: 1. Functional deficits debility secondary to type a aortic dissection status postrepair and replacement of a sending aorta and replacement of aortic arch 54/00/8676 complicated by peritonitis secondary to incarcerated: Perforated cecal volvulus and ischemic colon.  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  Routine ostomy care as well as wound VAC changes             -patient may not shower             -ELOS/Goals: 7-10d  2.  Antithrombotics: -DVT/anticoagulation:  Pharmaceutical: Heparin             -antiplatelet therapy: Aspirin 324 mg daily 3. Pain Management: Robaxin and oxycodone as needed 4. Mood: Provide emotional support             -antipsychotic agents: N/A 5. Neuropsych: This patient is capable of making decisions on her own behalf. 6. Skin/Wound Care: Routine skin checks 7. Fluids/Electrolytes/Nutrition: Routine in and outs with follow-up chemistries 8.  Acute blood loss anemia.  Multiple transfusions/FFP.  Follow-up CBC stable 7.9 9.  A. fib/RVR 04/06/2021.  Continue amiodarone 200 mg daily/Lopressor 12.5 mg twice daily.  Cardiac rate controlled.  Not an anticoagulation candidate at this time. 10.  Acute on chronic diastolic congestive heart failure.  Monitor for any signs of fluid  overload.  Lasix adjusted 40 mg Monday Wednesday Friday due to AKI 11.  AKI/CKD stage III.  Follow-up chemistries 12.  Obesity.  BMI 37.91.  Dietary follow-up 13.  Breast asymmetry L>R with tenderness, ? Mastitis vs mass, contacted Gen Surgery who did eval pt - no infx suspect, rec yearly mammo as OP  Rec a supportive bra, ? Sports bra can be brought in per family                 LOS: 3 days A FACE TO FACE EVALUATION WAS PERFORMED  Charlett Blake 04/18/2021, 10:09 AM

## 2021-04-18 NOTE — Progress Notes (Signed)
° °  Subjective/Chief Complaint: Called to see patient in rehab secondary to L breast edema and tenderness.  She states this is not tender to touch, but tender secondary to the weight of it pulling down from size.  This has been edematous during her entire hospital stay.  She denies any drainage from her nipple or other issues.  She last had a mammogram in 2019 with no acute findings on the left side, but possible mass on the right.     Objective: Vital signs in last 24 hours: Temp:  [98.6 F (37 C)-98.8 F (37.1 C)] 98.8 F (37.1 C) (01/12 0445) Pulse Rate:  [73-77] 77 (01/12 0445) Resp:  [16] 16 (01/12 0445) BP: (135-138)/(78-82) 135/82 (01/12 0445) SpO2:  [94 %] 94 % (01/12 0445) Last BM Date: 04/17/21  Intake/Output from previous day: 01/11 0701 - 01/12 0700 In: 720 [P.O.:720] Out: 250 [Stool:250] Intake/Output this shift: Total I/O In: 600 [P.O.:600] Out: -   Exam: Breast: L breast is more edematous than right. There is no erythema or cellulitis. This is not tender to palpation.  There are no areas of fluctuance.  There is no nipple discharge, drainage, or inversion.  There is peau d' orange.  No masses noted, but she has a significant amount of breast tissue as well as edema as noted above. Abd: soft, appropriately tender, wound with VAC in place. Ileostomy working well, stoma is pink    Lab Results:  Recent Labs    04/15/21 1636 04/16/21 0524  WBC 12.2* 10.4  HGB 8.2* 7.9*  HCT 26.5* 25.3*  PLT 541* 530*   BMET Recent Labs    04/15/21 1636 04/16/21 0524  NA 138 140  K 4.7 4.8  CL 103 105  CO2 27 25  GLUCOSE 109* 103*  BUN 23* 23*  CREATININE 2.27* 2.14*  CALCIUM 8.6* 8.2*   PT/INR No results for input(s): LABPROT, INR in the last 72 hours. ABG No results for input(s): PHART, HCO3 in the last 72 hours.  Invalid input(s): PCO2, PO2   Studies/Results: No results found.  Anti-infectives: Anti-infectives (From admission, onward)    None        Assessment/Plan: Chronically incarcerated ventral hernia Bowel perforation -POD #16 s/p ex lap, adhesiolysis, extended R hemicolectomy, primary incisional hernia repair, ileostomy creation, abdominal washout, drain placement x2, incisional wound vac placement 30x8x4cm - Dr. Bobbye Morton 04/02/21 -tolerating HH diet and cont Glucerna -VAC in place  L breast edema -no evidence of acute infection or cellulitis -she has asymmetrical edema.  She did have some L arm edema during her stay as well and these may be related, but no overt evidence of infection or inflammatory changes -she would benefit from a bra for support to help with discomfort.  If this is unavailable then a breast binder would be appropriate. -she will need to arrange for her yearly mammogram once she has been discharge. -no further recommendations at this time.   FEN - HH diet, Glucerna VTE - heparin, ID - Vanc/zosyn - stopped 1/3  ABL anemia -  S/p ascending aortic replacement Acute on chronic kidney disease A fib obesity  Straightforward MDM Henreitta Cea PA-C 04/18/2021

## 2021-04-18 NOTE — Progress Notes (Signed)
Occupational Therapy Session Note  Patient Details  Name: Dorothy Harris MRN: [id number] Date of Birth: 1960/04/11  Today's Date: 04/18/2021 OT Individual Time: 5170-0174 OT Individual Time Calculation (min): 40 min    Short Term Goals: Week 1:  OT Short Term Goal 1 (Week 1): STG = LTG 2/2 ELOS  Skilled Therapeutic Interventions/Progress Updates:  Pt greeted  EOB reluctantly agreeable to OT intervention. Session focus on BADL reeducation, functional mobility, BLE strengthening, increasing overall activity tolerance and decreasing overall caregiver burden.  Pt request to wash her face with EOB with set- up assist. CGA to rise from EOB for sit<>stand however pt pushing up from EOB with BUEs despite prior education on sternal precautions. CGA for ~ 29ft of functional mobility from EOB>w/c with rw. Pt transported to gym with total A where session focused on sit<>stands while maintaining sternal precautions. Pt needed MOD A to stand with BUEs positioned on pt knees to maintain precautions, with one insistence of knees buckling in standing. Worked on BLE strengthening to increase LB strength to facilitate independence with sit<>stands for ADL participation, pt completed 3 mins on  kinetron O2 98% on RA with HR 87 bpm. Pt declined walking back to room d/t fatigue, pt transported back in w/c with total A. Education and handout provided on sternal precautions. pt left seated in w/c with alarm belt activated and all needs within reach.                       Therapy Documentation Precautions:  Precautions Precautions: Fall, Sternal Precaution Booklet Issued: Yes (comment) Precaution Comments: monitor O2, HR; abdominal wound vac, colostomy Restrictions Weight Bearing Restrictions: No RUE Weight Bearing: Non weight bearing  Pain: no pain reported during session   Therapy/Group: Individual Therapy  Corinne Ports [facility] 04/18/2021, 9:15 AM

## 2021-04-18 NOTE — Progress Notes (Signed)
Had an uneventful night. Required meds for muscle spasm. CBG monitored and ISScale  followed. Wound vac therapy continues. Ileostomy education done. Safety maintained.

## 2021-04-18 NOTE — Progress Notes (Signed)
Physical Therapy Session Note  Patient Details  Name: Dorothy Harris MRN: [id number] Date of Birth: Jul 04, 1960  Today's Date: 04/18/2021 PT Individual Time: 8811-0315 PT Individual Time Calculation (min): 71 min   Short Term Goals: Week 1:  PT Short Term Goal 1 (Week 1): STG = LTG d/t ELOS  Skilled Therapeutic Interventions/Progress Updates:  Pt received seated in WC in room, reported 8/10 pain in L breast and declined pain meds. Offered repositioning and distraction throughout session for pain modulation. Noted significant swelling of L breast, notified nursing and MD who suspect mastitis. Donned gait belt above breasts to increase comfort. Emphasis of session on gait training, education on sternal precautions and transfers. Pt transported to 4N hallway w/total A for time management for gait training. Educated pt on sternal precautions to reinforce education provided by OT in previous session, no recall noted. SpO2 at rest at 93% and frequently checked throughout session. Encouraged pt to perform sit <>stand without using BUEs, but pt unable and pushed up from chair w/CGA impulsively. Pt ambulated 67' x2 w/RW and CGA. SpO2 >94% after each gait trial. Noted narrow BOS, overreliance on BUEs on RW, decreased cadence, decreased step length/clearance bilaterally and maintained downward gaze. Provided min verbal cues for forward gaze to promote upright posture and reduce pressure on BUEs, but pt does not appreciate correctional cues so ceased cues. Pt required significant seated rest break in between gait trials and demonstrated very flat affect. Pt reported swelling in RLE which bothered her in weightbearing, transported pt back to room w/total A and obtained ted hose for edema management. Donned ted hose w/total A and educated pt on maintaining elevation of BLEs and performing ankle pumps for reduced risk of swelling. Pt verbalized understanding. Pt was left seated in WC in room, all needs in reach.    Therapy Documentation Precautions:  Precautions Precautions: Fall, Sternal Precaution Booklet Issued: Yes (comment) Precaution Comments: monitor O2, HR; abdominal wound vac, colostomy Restrictions Weight Bearing Restrictions: No RUE Weight Bearing: Non weight bearing   Therapy/Group: Individual Therapy Dorothy Harris, PT, DPT  04/18/2021, 7:55 AM

## 2021-04-19 LAB — GLUCOSE, CAPILLARY
Glucose-Capillary: 111 mg/dL — ABNORMAL HIGH (ref 70–99)
Glucose-Capillary: 111 mg/dL — ABNORMAL HIGH (ref 70–99)
Glucose-Capillary: 113 mg/dL — ABNORMAL HIGH (ref 70–99)
Glucose-Capillary: 156 mg/dL — ABNORMAL HIGH (ref 70–99)
Glucose-Capillary: 97 mg/dL (ref 70–99)

## 2021-04-19 NOTE — Progress Notes (Signed)
Physical Therapy Session Note  Patient Details  Name: Dorothy Harris MRN: [id number] Date of Birth: 1960/12/16  Today's Date: 04/19/2021 PT Individual Time: 5102-5852 PT Individual Time Calculation (min): 58 min   Short Term Goals: Week 1:  PT Short Term Goal 1 (Week 1): STG = LTG d/t ELOS Week 2:    Week 3:     Skilled Therapeutic Interventions/Progress Updates:    PAIN pt states "im ok", treatment to tolerance  Pt initially oob in wc.  Transported to gym. In wc Laqs w/2.5lb wt and 5 sec hold in extension x 20 each  Wc to mat stand pivot transfer w/RW and cga, cues for adherence to sternal precs.  Functional strengthening: Sit to stand from elevated mat  23.5in w/hands on knees x 10 reps.  Hr 87, 02 94%                                                     23 in w/hands clasped together x 10  HR 96, 02 94%                                                     22 in w/hands on knees x 5 reps  Sit to stand to RW in prep for gait - pt attepts to transition w/both hands on walker, elbows outward, and pusshing thru walker.  Able to correct when instructed by therapist to repeat as w/exercise w/hands on knees w/cga.  Gait 32ftx 1, 137ft x 1  w/RW w/cga to close supervision, assist to manage vac, acceptable wbing thru walker.  HR 82-89, 02 sats 98% following gait trials.  Kinetron x 2 min at 60*/sec continuously for cardiovascular conditioning. Pt transported to room.  Pt left oob in wc w/alarm belt set and needs in reach   Therapy Documentation Precautions:  Precautions Precautions: Fall, Sternal Precaution Booklet Issued: Yes (comment) Precaution Comments: monitor O2, HR; abdominal wound vac, colostomy Restrictions Weight Bearing Restrictions: No RUE Weight Bearing: Non weight bearing    Therapy/Group: Individual Therapy Callie Fielding, [place] 04/19/2021, 12:22 PM

## 2021-04-19 NOTE — Consult Note (Signed)
[place] Nurse wound follow up Patient receiving care in [facility] 4W17 Wound type: Surgical midline abdominal  Wound bed: Pink /red granulation tissue with small amount of brown tissue in the middle of the wound.  Drainage (amount, consistency, odor) serosanguinous in the canister Periwound: intact Dressing procedure/placement/frequency: Removed one piece of black foam. Replaced one piece of black foam. Drape applied. Immediate seal obtained at 125 mmHg   Dressing kits placed in top drawer in room   [place] Nurse ostomy follow up Stoma type/location: RLQ ileostomy Stomal assessment/size: 1 1/2" pink oval just above skin level with sutures intact  Peristomal assessment: intact Treatment options for stomal/peristomal skin: barrier ring Output: mushy dark brown Ostomy pouching: 2pc. 2 1/4" Education provided:  Patient was able to remove pouch, clean around the stoma, wipe with skin barrier wipes, place barrier ring around the stoma, cut pouch to size, put pouch together, place over the stom and close the pouch. Continue to let patient empty pouch.    I recommend [facility] outpatient ostomy clinic as an outpatient resource.  IF MD agrees this would be beneficial, please fax referral, or enter electronically in Epic the referral. (Fax- [phone])  Enrolled patient in [place] Start Discharge program: Yes   WOC to follow M/W/F for vac changes and ostomy education and teaching.   Cathlean Marseilles Tamala Julian, MSN, RN, [place], Lysle Pearl, [facility] Wound Treatment Associate Pager [phone]

## 2021-04-19 NOTE — Progress Notes (Signed)
Physical Therapy Session Note  Patient Details  Name: Dorothy Harris MRN: [id number] Date of Birth: 02-Aug-1960  Today's Date: 04/19/2021 PT Individual Time: 9470-7615 PT Individual Time Calculation (min): 53 min   Short Term Goals: Week 1:  PT Short Term Goal 1 (Week 1): STG = LTG d/t ELOS  Skilled Therapeutic Interventions/Progress Updates:   Received pt sitting in [facility] with family present at bedside. Pt agreeable to PT treatment, and denied any pain during session. Session with emphasis on functional mobility/transfers, generalized strengthening and endurance, dynamic standing balance/coordination, and gait training. Pt transported to/from room in [facility] dependently for time management purposes. Sit<>stand with RW and CGA (cues for adherence to sternal precautions) and ambulated 17ft x 1 and 175ft x 1 trial with RW and CGA. O2 sat 93% afterwards and pt reported mild SOB. Sit<>stand on Airex with min A - but again pt pushing up with 1UE and breaking sternal precautions despite cues. Worked on standing balance on Airex without UE support for 30 seconds with close supervision prior to sitting. Pt suddenly became very frustrated with therapist stating "all y'all just have me doing the same thing, standing up, and my legs are tired!" Explained pt's current deficits and importance of repetition to build strength but pt became very agitated with any further standing activities but was agreeable to seated theraex. Pt performed the following exercises sitting with supervision and verbal cues for technique with emphasis on LE strength: -LAQ 2x10 bilaterally with 1lb ankle weight  -hip flexion 2x10 bilaterally with 1lb ankle weight -hip adduction ball squeezes 2x12 Stand<>pivot mat<>WC with RW and CGA and requested to return to bed. Pt ambulated 67ft with RW and CGA to bed and transferred sit<>supine with supervision. Concluded session with pt supine in bed, needs within reach, and bed alarm on. NT present at  bedside checking vitals and provided pt with fresh drink.   Therapy Documentation Precautions:  Precautions Precautions: Fall, Sternal Precaution Booklet Issued: Yes (comment) Precaution Comments: monitor O2, HR; abdominal wound vac, colostomy Restrictions Weight Bearing Restrictions: No RUE Weight Bearing: Non weight bearing  Therapy/Group: Individual Therapy Alfonse Alpers PT, DPT   04/19/2021, 7:09 AM

## 2021-04-19 NOTE — Progress Notes (Signed)
Occupational Therapy Session Note  Patient Details  Name: Dorothy Harris MRN: [id number] Date of Birth: 1960-09-01  Today's Date: 04/20/2021 OT Individual Time: 1315-1346 OT Individual Time Calculation (min): 31 min   Short Term Goals: Week 1:  OT Short Term Goal 1 (Week 1): STG = LTG 2/2 ELOS  Skilled Therapeutic Interventions/Progress Updates:    Pt greeted in bed, reporting pain but did not rate or say where. RN in to provide pain medicine and OT also provided lavender aromatherapy to address during session with pt consent. Pt completed bed mobility with supervision/cues for sternal precaution adherence. Sit<stand from EOB completed with cues as well, CGA to rise into standing with RW. Short distance ambulatory transfer completed to w/c parked in front of sink. Pt motivated to engage in laundry tasks during session. She declined ambulating to the laundry room so OT escorted her via w/c. Pt willing to ambulate ~5 ft to the washer/dryer unit with supervision-CGA using RW. Pt transferred 1 clothing item at a time from washer to the dryer given Min A. She then side-stepped towards the dryer unit to adjust settings. Pt returned to the w/c and reported that she wanted to return to bed vs sit up. Felt tired. OT escorted her back to the room and pt completed another short distance ambulatory transfer back to bed using RW with CGA-supervision. Once again, pt required cues for hand placement during power up. Also needed to cue pt on hand placement when scooting back/forward in the w/c. Pt did well with precaution adherence when returning to bed. She remained in bed at close of session, all needs including heart pillow within reach. 4 bedrails up of air mattress. Tx focus placed on IADL retraining, sit<stands, standing balance, and precaution adherence during functional activity.   Therapy Documentation Precautions:  Precautions Precautions: Fall, Sternal Precaution Booklet Issued: Yes (comment) Precaution  Comments: monitor O2, HR; abdominal wound vac, colostomy Restrictions Weight Bearing Restrictions: No RUE Weight Bearing: Non weight bearing ADL: ADL Eating: Set up Where Assessed-Eating: Bed level Grooming: Supervision/safety Where Assessed-Grooming: Sitting at sink Upper Body Bathing: Supervision/safety Where Assessed-Upper Body Bathing: Sitting at sink Lower Body Bathing: Minimal assistance Where Assessed-Lower Body Bathing: Standing at sink Upper Body Dressing: Supervision/safety Where Assessed-Upper Body Dressing: Sitting at sink Lower Body Dressing: Minimal assistance Where Assessed-Lower Body Dressing: Standing at sink Toileting: Moderate assistance Where Assessed-Toileting: Toilet, Recruitment consultant Transfer: Moderate assistance Toilet Transfer Method: Ambulating Tub/Shower Transfer: Not assessed Social research officer, government: Not assessed  Therapy/Group: Individual Therapy  Caulder Wehner A Patience Nuzzo 04/20/2021, 3:10 PM

## 2021-04-19 NOTE — Progress Notes (Signed)
Physical Therapy Session Note  Patient Details  Name: Dorothy Harris MRN: [id number] Date of Birth: 1960-05-18  Today's Date: 04/19/2021 PT Individual Time: 8323-4688 PT Individual Time Calculation (min): 26 min   Short Term Goals: Week 1:  PT Short Term Goal 1 (Week 1): STG = LTG d/t ELOS  Skilled Therapeutic Interventions/Progress Updates:  Pt received supine in bed, denied pain and was agreeable to PT. Emphasis of session on anterior weight shifting for sit <>stands and education regarding sternal precautions. Pt performed bed mobility mod I w/bedrail and sit <>stand to RW w/min A, noted better hand placement to adhere to sternal precautions. Pt ambulated 10' to [facility] w/RW and CGA and remainder of session spent on blocked practice of steps 1 and 2 of sit <>stand using NDT principles to facilitate anterior weight shifting and reduce use of BUEs when coming to stand. Pt able to clear hips off of chair but unable to come to full stand. Lengthy discussion regarding importance of sternal precautions and pt goals for CIR. Pt determined to return to walking without RW, informed pt she was safer w/RW right now but would be a good goal to work towards after DC w/therapy. Pt verbalized understanding. Also educated pt on wearing a sports bra to assist w/L breast edema and improve comfort. Upon assessing pt's R foot for edema, noted R calf was very hard and swollen compared to remainder of extremity. Nursing notified. Pt was left seated in WC in room, all needs in reach.   Therapy Documentation Precautions:  Precautions Precautions: Fall, Sternal Precaution Booklet Issued: Yes (comment) Precaution Comments: monitor O2, HR; abdominal wound vac, colostomy Restrictions Weight Bearing Restrictions: No RUE Weight Bearing: Non weight bearing   Therapy/Group: Individual Therapy Dorothy Harris, PT, DPT  04/19/2021, 7:46 AM

## 2021-04-19 NOTE — Progress Notes (Signed)
Occupational Therapy Session Note  Patient Details  Name: Dorothy Harris MRN: [id number] Date of Birth: [birth year]  Today's Date: 04/19/2021 OT Individual Time: 1045-1130 OT Individual Time Calculation (min): 45 min    Short Term Goals: Week 1:  OT Short Term Goal 1 (Week 1): STG = LTG 2/2 ELOS  Skilled Therapeutic Interventions/Progress Updates:    Pt received in chair and she stated she wanted to bathe and change clothing. She retrieved the clothing from her bags and then was set up at the sink.  Pt completed all UB self care independently.  Close S with sit to stand from wc to sink. Pt has great difficulty standing with just her legs to follow sternal precautions so has to use 1 arm for light support.   Pt able to stand at sink to wash perineal area using twisting of torso to avoid over reaching back with arms for sternal prec. Able to stand with close S.    Donned pants with supervision.   Then needed to toilet. Stood to [employer] and ambulated with CGA to toilet. Able to toilet and stand up from toilet with S.  Pt is much stronger today but feels she needs several more days to walk further more independently before going home.   Pt resting in wc with chair pad alarm on and all needs met.   Therapy Documentation Precautions:  Precautions Precautions: Fall, Sternal Precaution Booklet Issued: Yes (comment) Precaution Comments: monitor O2, HR; abdominal wound vac, colostomy Restrictions Weight Bearing Restrictions: No RUE Weight Bearing: Non weight bearing  Pain: Pain Assessment Pain Scale: 0-10 Pain Score: 0-No pain    Therapy/Group: Individual Therapy  [place] 04/19/2021, 12:27 PM

## 2021-04-20 LAB — GLUCOSE, CAPILLARY
Glucose-Capillary: 109 mg/dL — ABNORMAL HIGH (ref 70–99)
Glucose-Capillary: 135 mg/dL — ABNORMAL HIGH (ref 70–99)
Glucose-Capillary: 143 mg/dL — ABNORMAL HIGH (ref 70–99)

## 2021-04-20 NOTE — Progress Notes (Signed)
PROGRESS NOTE   Subjective/Complaints:  Can with OT using walker , dicussed d/c for next week  No breast pain,  ROS- neg CP, SOB , N/V/D  Objective:   No results found. No results for input(s): WBC, HGB, HCT, PLT in the last 72 hours.  No results for input(s): NA, K, CL, CO2, GLUCOSE, BUN, CREATININE, CALCIUM in the last 72 hours.   Intake/Output Summary (Last 24 hours) at 04/20/2021 1049 Last data filed at 04/20/2021 0815 Gross per 24 hour  Intake 840 ml  Output 450 ml  Net 390 ml         Physical Exam: Vital Signs Blood pressure (!) 141/68, pulse 77, temperature 98.3 F (36.8 C), temperature source Oral, resp. rate 18, height $RemoveBe'5\' 7"'lRWHmuFgE$  (1.702 m), weight 98.4 kg, SpO2 98 %.   General: No acute distress Mood and affect are appropriate Heart: Regular rate and rhythm no rubs murmurs or extra sounds Lungs: Clear to auscultation, breathing unlabored, no rales or wheezes Abdomen: Positive bowel sounds, soft nontender to palpation, nondistended, + ileostomy, Midline abd wound vac  Extremities: No clubbing, cyanosis, or edema Skin: No evidence of breakdown, no evidence of rash, serosang drainage from abd wound vac  Neurologic: Cranial nerves II through XII intact, motor strength is 4/5 in bilateral deltoid, bicep, tricep, grip, hip flexor, knee extensors, ankle dorsiflexor and plantar flexor Sensory exam normal sensation to light touch and proprioception in bilateral upper and lower extremities Cerebellar exam normal finger to nose to finger as well as heel to shin in bilateral upper and lower extremities Musculoskeletal: Full range of motion in all 4 extremities. No joint swelling Breasts- left breast enlarged vs Right . Induration , mild erythema around Left areola, no nipple d/c with expression, mild/mod tenderness in dependant portions of breast, RIght side normal   Assessment/Plan: 1. Functional deficits which require 3+  hours per day of interdisciplinary therapy in a comprehensive inpatient rehab setting. Physiatrist is providing close team supervision and 24 hour management of active medical problems listed below. Physiatrist and rehab team continue to assess barriers to discharge/monitor patient progress toward functional and medical goals  Care Tool:  Bathing    Body parts bathed by patient: Face, Right arm, Left arm, Chest, Front perineal area, Buttocks, Right upper leg, Left upper leg, Right lower leg, Left lower leg     Body parts n/a: Abdomen   Bathing assist Assist Level: Supervision/Verbal cueing     Upper Body Dressing/Undressing Upper body dressing   What is the patient wearing?: Pull over shirt    Upper body assist Assist Level: Independent    Lower Body Dressing/Undressing Lower body dressing    Lower body dressing activity did not occur: N/A What is the patient wearing?: Underwear/pull up, Pants     Lower body assist Assist for lower body dressing: Supervision/Verbal cueing     Toileting Toileting Toileting Activity did not occur (Clothing management and hygiene only): N/A (no void or bm)  Toileting assist Assist for toileting: Supervision/Verbal cueing     Transfers Chair/bed transfer  Transfers assist     Chair/bed transfer assist level: Contact Guard/Touching assist     Locomotion Ambulation  Ambulation assist      Assist level: Contact Guard/Touching assist Assistive device: Walker-rolling Max distance: 13ft   Walk 10 feet activity   Assist     Assist level: Contact Guard/Touching assist Assistive device: Walker-rolling   Walk 50 feet activity   Assist Walk 50 feet with 2 turns activity did not occur: Safety/medical concerns  Assist level: Contact Guard/Touching assist Assistive device: Walker-rolling    Walk 150 feet activity   Assist Walk 150 feet activity did not occur: Safety/medical concerns  Assist level: Contact  Guard/Touching assist Assistive device: Walker-rolling    Walk 10 feet on uneven surface  activity   Assist Walk 10 feet on uneven surfaces activity did not occur: Safety/medical concerns         Wheelchair     Assist Is the patient using a wheelchair?: No (prohibited by sternal precautions)   Wheelchair activity did not occur: Safety/medical concerns         Wheelchair 50 feet with 2 turns activity    Assist    Wheelchair 50 feet with 2 turns activity did not occur: Safety/medical concerns       Wheelchair 150 feet activity     Assist  Wheelchair 150 feet activity did not occur: Safety/medical concerns       Blood pressure (!) 141/68, pulse 77, temperature 98.3 F (36.8 C), temperature source Oral, resp. rate 18, height $RemoveBe'5\' 7"'LcMSrPCqv$  (1.702 m), weight 98.4 kg, SpO2 98 %.  Medical Problem List and Plan: 1. 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  Routine ostomy care as well as wound VAC changes             -patient may not shower             -ELOS/Goals: 7-10d  2.  Antithrombotics: -DVT/anticoagulation:  Pharmaceutical: Heparin             -antiplatelet therapy: Aspirin 324 mg daily 3. Pain Management: Robaxin and oxycodone as needed 4. Mood: Provide emotional support             -antipsychotic agents: N/A 5. Neuropsych: This patient is capable of making decisions on her own behalf. 6. Skin/Wound Care: Routine skin checks 7. Fluids/Electrolytes/Nutrition: Routine in and outs with follow-up chemistries 8.  Acute blood loss anemia.  Multiple transfusions/FFP.  Follow-up CBC stable 7.9 9.  A. fib/RVR 04/06/2021.  Continue amiodarone 200 mg daily/Lopressor 12.5 mg twice daily.   Cardiac rate controlled.  Not an anticoagulation candidate at this time. Vitals:   04/19/21 1945 04/20/21 0500  BP: (!) 151/78 (!) 141/68  Pulse: 73 77  Resp: 18 18  Temp: 98.8 F (37.1 C) 98.3 F (36.8 C)  SpO2: 96% 98%   HR controlled BP mildly elevated today , cont to monitor for now  10.  Acute on chronic diastolic congestive heart failure.  Monitor for any signs of fluid overload.  Lasix adjusted 40 mg Monday Wednesday Friday due to AKI 11.  AKI/CKD stage III.  Follow-up chemistries 12.  Obesity.  BMI 37.91.  Dietary follow-up 13.  Breast asymmetry L>R with tenderness, ? Mastitis vs mass, contacted Gen Surgery who did eval pt - no infx suspect, rec yearly mammo as OP  Rec a supportive bra, ? Sports bra can be brought in  per family                 LOS: 5 days A FACE TO FACE EVALUATION WAS PERFORMED  Dorothy Harris 04/20/2021, 10:49 AM

## 2021-04-20 NOTE — Progress Notes (Signed)
Occupational Therapy Session Note  Patient Details  Name: Dorothy Harris MRN: [id number] Date of Birth: 11/13/1960  Today's Date: 04/20/2021 OT Individual Time: 1045-1130 OT Individual Time Calculation (min): 45 min    Short Term Goals: Week 1:  OT Short Term Goal 1 (Week 1): STG = LTG 2/2 ELOS  Skilled Therapeutic Interventions/Progress Updates:  Patient met seated in wc in agreement with OT treatment session. 0/10 pain reported at rest and with activity. Patient with desire to complete bathing (sink level only 2/2 wound vac). Task initiated at sink level with Mod I to supervision A overall in sitting/standing. Patient indicated need for toileting completing 3/3 parts of the task with supervision A overall for management of wound vac. Patient then completed hand hygiene in standing at sink level. Oral hygiene completed in sitting 2/2 fatigue. Good adherence to sternal/abdominal precautions throughout a.m. ADLs. Functional mobility to laundry room without AD and S to Min guard A. Patient able to load washing machine without external assist. Patient educated on notifying next therapist to assist with moving clothes from washer to dryer. Upon return to room patient reporting fatigue with desire to terminate remainder of treatment session. Patient missed 30 minutes of skilled OT treatment session. Session concluded with patient seated in wc with call bell within reach, chair alarm activated and all needs met.   Therapy Documentation Precautions:  Precautions Precautions: Fall, Sternal Precaution Booklet Issued: Yes (comment) Precaution Comments: monitor O2, HR; abdominal wound vac, colostomy Restrictions Weight Bearing Restrictions: No RUE Weight Bearing: Non weight bearing General: General OT Amount of Missed Time: 30 Minutes Therapy/Group: Individual Therapy  [name] 04/20/2021, 6:58 AM

## 2021-04-20 NOTE — Progress Notes (Signed)
Physical Therapy Session Note  Patient Details  Name: Dorothy Harris MRN: [id number] Date of Birth: 11-16-1960  Today's Date: 04/20/2021 PT Individual Time: 0900-0955 PT Individual Time Calculation (min): 55 min  Missed time: 1'  Short Term Goals: Week 1:  PT Short Term Goal 1 (Week 1): STG = LTG d/t ELOS  Skilled Therapeutic Interventions/Progress Updates:   First session:  Pt presents sitting in bed, elevated HOB. Pt transferred to sitting EOB using 1 UE, even w/ verbal cues for sternal precautions.  PT managed wound vac.  Pt transfers sit to stand / min A w/ constant verbal cues for hand placement on knees.  Pt amb x 5' w/ RW to w/c.  PT wheeled w/c to dayroom.  Pt performed multiple sit to stand transfers from w/c maintaining sternal precautions and verbal cues for breathing techniques.  Pt amb 150-180' w/ RW and CGA, multiple trials.  Pt amb through cone obstacle course w/ RW and CGA, including stepping over canes.  Pt required mod A for sit to stand transfer from armless chair.  Pt amb through cone obstacle course w/o AD min A.  Pt c/o fatigue at conclusion of session.  Pt returned to room and remained seated in w/c w/ seat alarm on and all needs in reach.  Nursing in room at conclusion.  Second session:  Pt presents supine in bed and refusing therapy.  Pt states "not feeling well."  Pt unable to be encouraged to participate even for LE therapy.  Pt remained in bed.     Therapy Documentation Precautions:  Precautions Precautions: Fall, Sternal Precaution Booklet Issued: Yes (comment) Precaution Comments: monitor O2, HR; abdominal wound vac, colostomy Restrictions Weight Bearing Restrictions: No RUE Weight Bearing: Non weight bearing General:   Vital Signs:  Pain: states no pain, but grimacing noted.       Therapy/Group: Individual Therapy  Ladoris Gene 04/20/2021, 10:03 AM

## 2021-04-20 NOTE — Progress Notes (Addendum)
Patient teaching:  Patient was able to provide return demonstration x2 with emptying ileostomy; she had no issues with skill. Notified oncoming nurse.  Yehuda Mao, LPN

## 2021-04-21 LAB — GLUCOSE, CAPILLARY
Glucose-Capillary: 100 mg/dL — ABNORMAL HIGH (ref 70–99)
Glucose-Capillary: 106 mg/dL — ABNORMAL HIGH (ref 70–99)
Glucose-Capillary: 117 mg/dL — ABNORMAL HIGH (ref 70–99)
Glucose-Capillary: 96 mg/dL (ref 70–99)

## 2021-04-22 DIAGNOSIS — I5033 Acute on chronic diastolic (congestive) heart failure: Secondary | ICD-10-CM

## 2021-04-22 DIAGNOSIS — I10 Essential (primary) hypertension: Secondary | ICD-10-CM

## 2021-04-22 DIAGNOSIS — S31109S Unspecified open wound of abdominal wall, unspecified quadrant without penetration into peritoneal cavity, sequela: Secondary | ICD-10-CM

## 2021-04-22 LAB — GLUCOSE, CAPILLARY
Glucose-Capillary: 100 mg/dL — ABNORMAL HIGH (ref 70–99)
Glucose-Capillary: 97 mg/dL (ref 70–99)
Glucose-Capillary: 107 mg/dL — ABNORMAL HIGH (ref 70–99)
Glucose-Capillary: 96 mg/dL (ref 70–99)

## 2021-04-22 MED ORDER — INSULIN ASPART 100 UNIT/ML IJ SOLN
0.0000 [IU] | Freq: Three times a day (TID) | INTRAMUSCULAR | Status: DC
Start: 2021-04-22 — End: 2021-04-22

## 2021-04-22 MED ORDER — INSULIN ASPART 100 UNIT/ML IJ SOLN: 0.0000 [IU] | Freq: Three times a day (TID) | INTRAMUSCULAR | Status: DC

## 2021-04-22 NOTE — Progress Notes (Signed)
Physical Therapy Session Note  Patient Details  Name: Dorothy Harris MRN: [id number] Date of Birth: [birth year]  Today's Date: 04/22/2021 PT Individual Time: 1050-1159 PT Individual Time Calculation (min): 69 min   Short Term Goals: Week 1:  PT Short Term Goal 1 (Week 1): STG = LTG d/t ELOS  Skilled Therapeutic Interventions/Progress Updates:     Pt received seated in WC and agrees to therapy. Reports having just taken a pain pill and no current complaint of pain. When asked what pt thinks she needs to work on prior to DC, pt replies, "Walking". WC transport to gym for time management. Pt performs sit to stand with CGA to RW, then ambulates x175' with RW with PT providing verbal cues for upright gaze to improve posture and balance, as well as pursed lip breathing to optimize oxygen sats and endurance. Following extended seated rest, pt ambulates x175' without AD, with CGA and same cueing for safety. Pt has tentative gait pattern and verbalizes increased fatigue following ambulation, but does not have any overt LOBs without AD.  Pt practices forward and backward ambulation without AD to challenge dynamic balance. Pt ambulates x15' forward and x15' backward x3 consecutive reps, with PT providing cues for increasing stride length, especially while going backward, and maintaining upright gaze to improve posture and balance.  Pt performs Furniture conservator/restorer with score of 43/56, indicating high risk for falls.   Pt performs Nustep for strength and endurance training. PT cues pt to complete for 12:00 total. Pt requires frequent brief seated rest breaks during activity and completes first half at workload of 5 and 2nd half at workload of 3. Pt voices significant fatigue following activity.  Pt performs stand step transfer from Nustep>WC>bed with close supervision and cues for positioning. Sit to supine with cues for sequencing. Left with alarm intact and all needs within reach.  2nd Session: Pt requests  to defer therapy at this time due to headache and "not feeling well." RN aware. PT will follow up as able.  Therapy Documentation Precautions:  Precautions Precautions: Fall, Sternal Precaution Booklet Issued: Yes (comment) Precaution Comments: monitor O2, HR; abdominal wound vac, colostomy Restrictions Weight Bearing Restrictions: No RUE Weight Bearing: Non weight bearing    Therapy/Group: Individual Therapy  Breck Coons, PT, DPT 04/22/2021, 3:30 PM

## 2021-04-22 NOTE — Progress Notes (Signed)
2210: Rounding complete. Pt is asleep, easy to wake up. PT' s pain was reduced my Prn Pain meds . Pt is sleeping on her back. She declines lying on R side. Weight shifted and turned back supine. Pain level is currently 0/10. Pt resp even and unlabored. No acute distress noted at this time. Offered to toilet or bedside toilet. Pt declined at this time.   0000 Pt rounding is complete. Pt lying on her back. Weight shifted and pillows offered. Offered to toilet pt. Pt states she will call out when she needs to go. Ostomy assessed and does not need to be emptied at this time. RN will continue to monitor.   0200 Rounding is complete. Pt personal possessions within reach. Call light within reach and bed alarms on.

## 2021-04-22 NOTE — Consult Note (Signed)
[place] Nurse wound follow up Patient receiving care in [facility] 4W17 Wound type: Surgical midline abdominal  Measurement: 18 x 5.5 x 5 Wound bed: Pink /red granulation tissue with small amount of brown tissue in the middle of the wound.  Drainage (amount, consistency, odor) serosanguinous in the canister Periwound: intact Dressing procedure/placement/frequency: Removed one piece of black foam. Replaced one piece of black foam. Drape applied. Immediate seal obtained at 125 mmHg   Dressing kits placed in top drawer in room   [place] Nurse ostomy follow up Stoma type/location: RLQ ileostomy Stomal assessment/size: 1 1/2" pink oval just above skin level with sutures intact  Peristomal assessment: intact Treatment options for stomal/peristomal skin: barrier ring Output: mushy dark brown Ostomy pouching: 2pc. 2 1/4" Education provided:  Patient was able to remove pouch, clean around the stoma, wipe with skin barrier wipes, place barrier ring around the stoma, cut pouch to size, put pouch together, place over the stoma and close the pouch. Continue to let patient empty pouch. Reviewed when to empty and letting gas out of pouch.    I recommend [facility] outpatient ostomy clinic as an outpatient resource.  IF MD agrees this would be beneficial, please fax referral, or enter electronically in Epic the referral. (Fax- [phone])  Enrolled patient in [place] Start Discharge program: Yes   WOC to follow M/W/F for vac changes and ostomy education and teaching.   Cathlean Marseilles Tamala Julian, MSN, RN, [place], Lysle Pearl, [facility] Wound Treatment Associate Pager [phone]

## 2021-04-22 NOTE — Progress Notes (Signed)
Occupational Therapy Session Note  Patient Details  Name: Dorothy Harris MRN: [id number] Date of Birth: 02-Aug-1960  Today's Date: 04/22/2021 OT Individual Time: 7939-0300 OT Individual Time Calculation (min): 55 min    Short Term Goals: Week 1:  OT Short Term Goal 1 (Week 1): STG = LTG 2/2 ELOS  Skilled Therapeutic Interventions/Progress Updates:    Pt received in bed and ready for therapy.  She did not want to engage in b/d as she stated she just got a bath yesterday when her aunt was here to help her and pt already dressed.  Pt agreeable to working on activity tolerance and strengthening exercises to prepare for home later this week.  Set up a circuit workout in her room.  Pt had to ambulate to her w/c (10 ft) do a set of exercises, then ambulate to arm chair by windows (10 ft) and do another set of exercises for a series of reps for a total of 3 sets each.  RW not used today. She can stand from higher wc seat with legs only, but from arm chair she did need to use 1 arm to help her stand up. Pt stated she did not feel any pressure in her sternum.   Exercise series from seated: -small arm circles for shoulders with arms at 90 sh flex -heel raises with calf squeezes - legs extended for scissor kicks - alt leg extensions with isometric holds -hip abd with resistance band - knee ext with resistance band -resisted knee flexion for hamstrings  Pt also completed 4 sets of 10 small squats.  She practiced reaching for items in low drawers and off floor not needing any UE support.  Practiced standing at sink and reaching forward to simulate reaching into cabinets with no difficulty.  Discussed how she will meal prep at home.  She plans to do cereal for breakfast, sandwiches for lunch and microwave meals for dinner.    She does not want to use a BSC over the toilet but instead plans to purchase a raised toilet seat.    Discussed that [place] therapies would be coming in.  Pt resting in w/c with  seat alarm on.    Therapy Documentation Precautions:  Precautions Precautions: Fall, Sternal Precaution Booklet Issued: Yes (comment) Precaution Comments: monitor O2, HR; abdominal wound vac, colostomy Restrictions Weight Bearing Restrictions: No RUE Weight Bearing: Non weight bearing  Pain: Pain Assessment Pain Scale: 0-10 Pain Score: 5  Pain Type: Chronic pain Pain Location: Abdomen Pain Onset: On-going Pain Intervention(s): Rest    Therapy/Group: Individual Therapy  [place] 04/22/2021, 10:22 AM

## 2021-04-22 NOTE — Progress Notes (Signed)
PROGRESS NOTE   Subjective/Complaints:  WOC RN performing vac change when I came in. Pt doing ok overall. Progressing in therapy  ROS: Patient denies fever, rash, sore throat, blurred vision, nausea, vomiting, diarrhea, cough, shortness of breath or chest pain, joint or back pain, headache, or mood change.    Objective:   No results found. No results for input(s): WBC, HGB, HCT, PLT in the last 72 hours.  No results for input(s): NA, K, CL, CO2, GLUCOSE, BUN, CREATININE, CALCIUM in the last 72 hours.   Intake/Output Summary (Last 24 hours) at 04/22/2021 1137 Last data filed at 04/22/2021 0844 Gross per 24 hour  Intake 480 ml  Output 650 ml  Net -170 ml        Physical Exam: Vital Signs Blood pressure (!) 151/98, pulse 65, temperature 98.8 F (37.1 C), resp. rate 18, height $RemoveBe'5\' 7"'yMxlnRsUv$  (7.824 m), weight 93 kg, SpO2 96 %.   Constitutional: No distress . Vital signs reviewed. HEENT: NCAT, EOMI, oral membranes moist Neck: supple Cardiovascular: RRR without murmur. No JVD    Respiratory/Chest: CTA Bilaterally without wheezes or rales. Normal effort    GI/Abdomen: BS +, abdominal wound with 2/3 beefy granulation tissue and the center 1/3 still with fibronecrotic debris. Some undermining. Ostomy intact.  Ext: no clubbing, cyanosis, or edema Psych: pleasant and cooperative  Skin: No evidence of breakdown, no evidence of rash, serosang drainage from abd wound vac  Neurologic: Cranial nerves II through XII intact, motor strength is 4/5 in bilateral deltoid, bicep, tricep, grip, hip flexor, knee extensors, ankle dorsiflexor and plantar flexor Sensory exam normal sensation to light touch and proprioception in bilateral upper and lower extremities Cerebellar exam normal finger to nose to finger as well as heel to shin in bilateral upper and lower extremities Musculoskeletal: Full range of motion in all 4 extremities. No joint  swelling Breasts- left breast enlarged vs Right--ongoing, steristripped  Assessment/Plan: 1. Functional deficits which require 3+ hours per day of interdisciplinary therapy in a comprehensive inpatient rehab setting. Physiatrist is providing close team supervision and 24 hour management of active medical problems listed below. Physiatrist and rehab team continue to assess barriers to discharge/monitor patient progress toward functional and medical goals  Care Tool:  Bathing    Body parts bathed by patient: Face, Right arm, Left arm, Chest, Front perineal area, Buttocks, Right upper leg, Left upper leg, Right lower leg, Left lower leg     Body parts n/a: Abdomen   Bathing assist Assist Level: Supervision/Verbal cueing     Upper Body Dressing/Undressing Upper body dressing   What is the patient wearing?: Pull over shirt    Upper body assist Assist Level: Independent    Lower Body Dressing/Undressing Lower body dressing    Lower body dressing activity did not occur: N/A What is the patient wearing?: Underwear/pull up, Pants     Lower body assist Assist for lower body dressing: Supervision/Verbal cueing     Toileting Toileting Toileting Activity did not occur (Clothing management and hygiene only): N/A (no void or bm)  Toileting assist Assist for toileting: Supervision/Verbal cueing     Transfers Chair/bed transfer  Transfers assist  Chair/bed transfer assist level: Contact Guard/Touching assist     Locomotion Ambulation   Ambulation assist      Assist level: Contact Guard/Touching assist Assistive device: Walker-rolling Max distance: 136ft   Walk 10 feet activity   Assist     Assist level: Contact Guard/Touching assist Assistive device: Walker-rolling   Walk 50 feet activity   Assist Walk 50 feet with 2 turns activity did not occur: Safety/medical concerns  Assist level: Contact Guard/Touching assist Assistive device: Walker-rolling     Walk 150 feet activity   Assist Walk 150 feet activity did not occur: Safety/medical concerns  Assist level: Contact Guard/Touching assist Assistive device: Walker-rolling    Walk 10 feet on uneven surface  activity   Assist Walk 10 feet on uneven surfaces activity did not occur: Safety/medical concerns         Wheelchair     Assist Is the patient using a wheelchair?: No (prohibited by sternal precautions)   Wheelchair activity did not occur: Safety/medical concerns         Wheelchair 50 feet with 2 turns activity    Assist    Wheelchair 50 feet with 2 turns activity did not occur: Safety/medical concerns       Wheelchair 150 feet activity     Assist  Wheelchair 150 feet activity did not occur: Safety/medical concerns       Blood pressure (!) 151/98, pulse 65, temperature 98.8 F (37.1 C), resp. rate 18, height $RemoveBe'5\' 7"'ckbNZDHpA$  (1.702 m), weight 93 kg, SpO2 96 %.  Medical Problem List and Plan: 1. 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  Routine ostomy care as well as wound VAC changes             -patient may not shower             -ELOS/Goals: 7-10d  -Continue CIR therapies including PT, OT  2.  Antithrombotics: -DVT/anticoagulation:  Pharmaceutical: Heparin             -antiplatelet therapy: Aspirin 324 mg daily 3. Pain Management: Robaxin and oxycodone as needed 4. Mood: Provide emotional support             -antipsychotic agents: N/A 5. Neuropsych: This patient is capable of making decisions on her own behalf. 6. Skin/Wound Care: Routine skin checks 7. Fluids/Electrolytes/Nutrition: Routine in and outs with follow-up chemistries 8.  Acute blood loss anemia.   Multiple transfusions/FFP.  Follow-up CBC stable 7.9  -repeat Tuesday 9.  A. fib/RVR 04/06/2021.  Continue amiodarone 200 mg daily/Lopressor 12.5 mg twice daily.  Cardiac rate controlled.  Not an anticoagulation candidate at this time. Vitals:   04/22/21 0428 04/22/21 0511  BP: (!) 179/86 (!) 151/98  Pulse: 68 65  Resp: 17 18  Temp:  98.8 F (37.1 C)  SpO2:  96%   1/16 HR controlled 10.  Acute on chronic diastolic congestive heart failure.  Monitor for any signs of fluid overload.  Lasix adjusted 40 mg Monday Wednesday Friday due to AKI   [facility] Weights   04/20/21 0606 04/21/21 0406 04/22/21 0600  Weight: 98.4 kg 91.6 kg 93 kg    11.  AKI/CKD stage III.  Follow-up chemistries 12.  Obesity.  BMI 37.91.  Dietary follow-up 13.  Breast asymmetry L>R with tenderness, ?  Mastitis vs mass, contacted Gen Surgery who did eval pt - no infx suspect, rec yearly mammo as OP Rec a supportive bra,  Sports bra can be brought in per family , K-tape                LOS: 7 days A FACE TO FACE EVALUATION WAS PERFORMED  Meredith Staggers 04/22/2021, 11:37 AM

## 2021-04-22 NOTE — Progress Notes (Signed)
Occupational Therapy Session Note  Patient Details  Name: Dorothy Harris MRN: [id number] Date of Birth: 27-Jan-1961  Today's Date: 04/22/2021 OT Individual Time: 4730-8569 OT Individual Time Calculation (min): 15 min  and Today's Date: 04/22/2021 OT Missed Time: 15 Minutes Missed Time Reason: Other (comment) (Pt requesting time to eat breakfast)   Short Term Goals: Week 1:  OT Short Term Goal 1 (Week 1): STG = LTG 2/2 ELOS   Skilled Therapeutic Interventions/Progress Updates:    Pt greeted at time of session semireclined in bed sleeping but easily aroused and agreeable to OT session with max encouragement. Pt stating she did not like early sessions but did need to use the bathroom. Supine > sit Mod I, ambulated bed <> bathroom with Supervision with RW and therapist assist to manage wound vac. 3/3 toileting tasks Supervision as well and educated on sternal precautions with no follow through, stating the way she is currently performing tasks does not hurt. Pt stopping to wash hands at sink before sitting EOB. Nursing brought breakfast tray at this time and pt requesting time to eat instead of continuing OT session. Wound vac plugged into wall, all needs met call bell in reach. Missed 15 mins of OT.   Therapy Documentation Precautions:  Precautions Precautions: Fall, Sternal Precaution Booklet Issued: Yes (comment) Precaution Comments: monitor O2, HR; abdominal wound vac, colostomy Restrictions Weight Bearing Restrictions: No RUE Weight Bearing: Non weight bearing     Therapy/Group: Individual Therapy  Viona Gilmore 04/22/2021, 7:15 AM

## 2021-04-23 ENCOUNTER — Other Ambulatory Visit: Payer: Self-pay | Admitting: Thoracic Surgery (Cardiothoracic Vascular Surgery)

## 2021-04-23 DIAGNOSIS — Z95828 Presence of other vascular implants and grafts: Secondary | ICD-10-CM

## 2021-04-23 LAB — BASIC METABOLIC PANEL
Anion gap: 8 (ref 5–15)
BUN: 17 mg/dL (ref 6–20)
CO2: 22 mmol/L (ref 22–32)
Calcium: 8.7 mg/dL — ABNORMAL LOW (ref 8.9–10.3)
Chloride: 109 mmol/L (ref 98–111)
Creatinine, Ser: 1.95 mg/dL — ABNORMAL HIGH (ref 0.44–1.00)
GFR, Estimated: 29 mL/min — ABNORMAL LOW (ref >60)
Glucose, Bld: 102 mg/dL — ABNORMAL HIGH (ref 70–99)
Potassium: 4.6 mmol/L (ref 3.5–5.1)
Sodium: 139 mmol/L (ref 135–145)

## 2021-04-23 LAB — CBC
HCT: 27.8 % — ABNORMAL LOW (ref 36.0–46.0)
Hemoglobin: 8.4 g/dL — ABNORMAL LOW (ref 12.0–15.0)
MCH: 31 pg (ref 26.0–34.0)
MCHC: 30.2 g/dL (ref 30.0–36.0)
MCV: 102.6 fL — ABNORMAL HIGH (ref 80.0–100.0)
Platelets: 399 10*3/uL (ref 150–400)
RBC: 2.71 MIL/uL — ABNORMAL LOW (ref 3.87–5.11)
RDW: 17.2 % — ABNORMAL HIGH (ref 11.5–15.5)
WBC: 6.7 10*3/uL (ref 4.0–10.5)
nRBC: 0 % (ref 0.0–0.2)

## 2021-04-23 LAB — GLUCOSE, CAPILLARY
Glucose-Capillary: 103 mg/dL — ABNORMAL HIGH (ref 70–99)
Glucose-Capillary: 93 mg/dL (ref 70–99)

## 2021-04-23 NOTE — Patient Care Conference (Signed)
Inpatient RehabilitationTeam Conference and Plan of Care Update Date: 04/23/2021   Time: 12:07 PM    Patient Name: Dorothy Harris      Medical Record Number: [id number]  Date of Birth: 04/24/1960 Sex: Female         Room/Bed: 4W17C/4W17C-01 Payor Info: Payor: [employer] / Plan: [employer] OON / Product Type: *No Product type* /    Admit Date/Time:  04/15/2021  4:15 PM  Primary Diagnosis:  [facility] Problems: Principal Problem:   Debility    Expected Discharge Date: Expected Discharge Date: 04/26/21  Team Members Present: Physician leading conference: Dr. Alger Simons Social Worker Present: Ovidio Kin, LCSW Nurse Present: Dorien Chihuahua, RN PT Present: Becky Sax, PT OT Present: Meriel Pica, OT PPS Coordinator present : Gunnar Fusi, SLP     Current Status/Progress Goal Weekly Team Focus  Bowel/Bladder   continet of bladder and illeostomy in place  Education on ostomy care and managing ostomy independently or with little assistance  PT will manage ostomy care and follow tolieting schedule   Swallow/Nutrition/ Hydration             ADL's   supervision overall with self care and transfers (occasional A with socks if abdomen is sore)  mod I  ADL training, functional mobility, adherance to precautions, pt education   Mobility   gait up to 129ft with RW and CGA, mod A sit<>stands without BUE support, but CGA with BUE support, difficutly adhering to sternal precautions  Mod I- S*  Pt edu, stair training, energy conservation, adherence to precautions, transfers, D/C planning   Communication             Safety/Cognition/ Behavioral Observations            Pain             Skin   Wound Vac to abdomen, Abraison to L breast R side illeostomy  Wound care education and repositioning to prevent skin breakdown  Turning Q 2 hours     Discharge Planning:  HOme alone with intermittent assist from neighbor. Pt will need to learn illeostomy care prior to  DC home. Pt somewhat unrealistic regarding care needs and who will be doing this for her   Team Discussion: Wound healing well, will need wound vac at discharge. Ostomy care progressing well. Patient following sternal precautions.  Patient on target to meet rehab goals: yes, currently sit - stand with CGA. Able to ambulate with a RW.  *See Care Plan and progress notes for long and short-term goals.   Revisions to Treatment Plan:  N/A   Teaching Needs: Safety, ostomy care, medication management, skin care, wound care, transfers, toileting, etc.  Current Barriers to Discharge: Decreased caregiver support, Wound care, and insurance coverage for [facility] follow up services; has a $9,000.00 deductible.  Plan is to go home solo; neighbors and friends nearby.  Possible Resolutions to Barriers: Aunt works but lives close by; able to do housekeeping, meal prep, etc. HH follow up services recommended Wound clinic near [facility] for wound vac management resources being explored     Medical Summary Current Status: aortic aneurysm with rupture,multiple surgical complications. large abdominal wound and ostomy. afib, rate controlled  Barriers to Discharge: Medical stability   Possible Resolutions to Barriers/Weekly Focus: wound care, nutrition, assessment of labs and pt data   Continued Need for Acute Rehabilitation Level of Care: The patient requires daily medical management by a physician with specialized training in physical medicine  and rehabilitation for the following reasons: Direction of a multidisciplinary physical rehabilitation program to maximize functional independence : Yes Medical management of patient stability for increased activity during participation in an intensive rehabilitation regime.: Yes Analysis of laboratory values and/or radiology reports with any subsequent need for medication adjustment and/or medical intervention. : Yes   I attest that I was present, lead the team  conference, and concur with the assessment and plan of the team.   Dorien Chihuahua B 04/23/2021, 4:50 PM

## 2021-04-23 NOTE — Progress Notes (Signed)
Occupational Therapy Session Note  Patient Details  Name: Dorothy Harris MRN: [id number] Date of Birth: December 23, 1960  Today's Date: 04/23/2021 OT Individual Time: 0929-5747 OT Individual Time Calculation (min): 54 min    Short Term Goals: Week 1:  OT Short Term Goal 1 (Week 1): STG = LTG 2/2 ELOS  Skilled Therapeutic Interventions/Progress Updates:    Pt in wheelchair to start session with O2 sats at 96% on room air.  She was able to state sternal precautions of not bringing her arms out to the side and then not lifting them out and overhead.  She was also able to state not to push up with her arms with sit to stand unless absolutely necessary, and then only use one hand.  Had her complete short distance functional mobility from her wheelchair out into the hallway and back with supervision and no assistive device to monitor O2 sats.  Sats remained greater than 95% on room air with HR at 110 after mobility.  Next, took pt down to the ADL apartment via wheelchair where she was educated on simple meal prep with use of the RW for support.  Discussed methods such as planning ahead and getting everything in an organized fashion for energy conservation when using the walker.  Also educated pt on using the counter tops for maneuvering items around the kitchen as well as using her walker bag.  She reports that her table is too far away for maneuvering something from the countertops to the table and she reports that it cannot be moved.  Discussed options for using a TV tray closer to the end of the counter to use or using a rolling stool or table to help transfer items.  Unsure if she will acquire any of these, but they are options she is aware of.  She reports eating sandwiches during the day and then her aunt or someone else coming by in the morning to bring her breakfast and assist with other meals.  Next, she was taken down to the tub room where she practiced tub transfers with min guard assist stepping into the  tub and out with use of the wall for support.  Discussed need for possible small shower seat at home once she can shower, but not a bench as she will be too independent and mobile to need this.  Next, when rolling her back toward the gym in the wheelchair the clamp on the wound vac became lodged in the wheel of the chair and caused the seal on the vac to come off of the sponge.  Took pt back to the room where she transferred to the bed with min guard assist and nursing came in to replace the seal.  She was left in the bed at this time secondary to increased back pain at 5/10.  She missed 15 mins of session.  Call button and phone in reach.    Therapy Documentation Precautions:  Precautions Precautions: Fall, Sternal Precaution Booklet Issued: Yes (comment) Precaution Comments: monitor O2, HR; abdominal wound vac, colostomy Restrictions Weight Bearing Restrictions: No RUE Weight Bearing: Non weight bearing Other Position/Activity Restrictions: sternal precautions General: General OT Amount of Missed Time: 15 Minutes  Pain: Pain Assessment Pain Scale: 0-10 Pain Score: 3  Pain Location: Back Pain Orientation: Lower Pain Intervention(s): Medication (See eMAR) ADL: See Care Tool Section for some details of mobility and selfcare   Therapy/Group: Individual Therapy  Kary Sugrue OTR/L 04/23/2021, 4:28 PM

## 2021-04-23 NOTE — Progress Notes (Signed)
Physical Therapy Session Note  Patient Details  Name: Dorothy Harris MRN: [id number] Date of Birth: [birth year]  Today's Date: 04/23/2021 PT Individual Time: 1015-1100 PT Individual Time Calculation (min): 45 min   Short Term Goals: Week 1:  PT Short Term Goal 1 (Week 1): STG = LTG d/t ELOS  Skilled Therapeutic Interventions/Progress Updates:      Pt presents sitting in w/c, agreeable to PT tx. Denies pain. Discussed DC planning and general home safety as DC not set but planned for possibly this week. Pt confirms living alone in a 1 lvl home with 2-3 STE with 1 railing on R. Reports intermittent family/friend assist is available also.   Pt transported in w/c to main rehab gym for time and energy conservation. Sit<>Stand from w/c height to RW with supervision and assist for wound vac line management. Ambulated ~169ft with supervision and RW - primary gait deficits is mild forward trunk lean and decreased gait speed. Instructed in stair training where she navigated up/down x4 steps with CGA and 2 rails and then x4 steps with CGA and 1 rail on R. Ambulated to ortho rehab gym with supervision and RW where she completed car transfer with car height simulating midsize SUV - completed with supervision with VC for technique but no physical assist needed.   Completed FGA for gait assessment and falls risk assessment with results listed below. She scored 22/30 and scores <22 indicate increased falls risk. Pt ed on falls risk category and importance of safety awareness with functional mobility which she voiced understanding.   Pt ambulated ~2101ft back towards room with supervision and RW - transported remaining distance due to fatigue to her room. Pt requesting to use bathroom and completed ambulatory transfer with supervision and RW within her room. Pt continent of bladder and then she requested nursing assistance with emptying colostomy which LPN was notified. Direct handoff of care to LPN at end of session  for colostomy care with pt sitting in w/c, all needs in reach. Wound vac connected to wall.   Therapy Documentation Precautions:  Precautions Precautions: Fall, Sternal Precaution Booklet Issued: Yes (comment) Precaution Comments: monitor O2, HR; abdominal wound vac, colostomy Restrictions Weight Bearing Restrictions: No RUE Weight Bearing: Non weight bearing General:     Balance Balance Assessed: Yes Standardized Balance Assessment Standardized Balance Assessment: Functional Gait Assessment Functional Gait Assessment Gait assessed : Yes Gait Level Surface: Walks 20 ft in less than 7 sec but greater than 5.5 sec, uses assistive device, slower speed, mild gait deviations, or deviates 6-10 in outside of the 12 in walkway width. Change in Gait Speed: Able to smoothly change walking speed without loss of balance or gait deviation. Deviate no more than 6 in outside of the 12 in walkway width. (with RW) Gait with Horizontal Head Turns: Performs head turns smoothly with slight change in gait velocity (eg, minor disruption to smooth gait path), deviates 6-10 in outside 12 in walkway width, or uses an assistive device. Gait with Vertical Head Turns: Performs task with slight change in gait velocity (eg, minor disruption to smooth gait path), deviates 6 - 10 in outside 12 in walkway width or uses assistive device Gait and Pivot Turn: Pivot turns safely in greater than 3 sec and stops with no loss of balance, or pivot turns safely within 3 sec and stops with mild imbalance, requires small steps to catch balance. Step Over Obstacle: Is able to step over 2 stacked shoe boxes taped together (9 in total height)  without changing gait speed. No evidence of imbalance. (with RW) Gait with Narrow Base of Support: Is able to ambulate for 10 steps heel to toe with no staggering. (with RW) Gait with Eyes Closed: Walks 20 ft, slow speed, abnormal gait pattern, evidence for imbalance, deviates 10-15 in outside 12  in walkway width. Requires more than 9 sec to ambulate 20 ft. Ambulating Backwards: Walks 20 ft, uses assistive device, slower speed, mild gait deviations, deviates 6-10 in outside 12 in walkway width. Steps: Alternating feet, must use rail. Total Score: 22/30 FGA comment:: Scored with RW at supervision level.  Therapy/Group: Individual Therapy  Alger Simons 04/23/2021, 7:31 AM

## 2021-04-23 NOTE — Progress Notes (Signed)
Occupational Therapy Session Note  Patient Details  Name: MAPLE ODANIEL MRN: [id number] Date of Birth: [birth year]  Today's Date: 04/23/2021 OT Individual Time: 7215-8727 OT Individual Time Calculation (min): 60 min    Short Term Goals: Week 1:  OT Short Term Goal 1 (Week 1): STG = LTG 2/2 ELOS  Skilled Therapeutic Interventions/Progress Updates:    Pt received sitting EOB ready to bathe and dress today.  Pt used RW to amb to toilet with distant S. Toileted mod I.   Then sat in regular arm chair at sink to bathe and dress without A or cues needed.  Pt then ambulated around room without RW to pick up towels and place in hamper, pick up clothing off of floor and place in bags with no A and no LOB.   She continues to occasionally stand up using B arms but states she is only using arms to balance and not pushing through them to adhere to sternal precautions.  She then sat in arm chair to work on a series of leg strengthening exercises using exercise band for hips, hamstrings and quads. Small arm circles for sh strength. Pt declined tricep ex stating her upper arms were sore from injections.  Standing knee marches and heel raises using light support on RW.    Discussed how her aunt lives 1 mile away and will help with meal prep, taking out her trash, housekeeping. Pt will need to be able to do light homemanagement and light meal prep.  Pt resting in wc with alarm on and all needs met.   Therapy Documentation Precautions:  Precautions Precautions: Fall, Sternal Precaution Booklet Issued: Yes (comment) Precaution Comments: monitor O2, HR; abdominal wound vac, colostomy Restrictions Weight Bearing Restrictions: No RUE Weight Bearing: Non weight bearing    Vital Signs: Therapy Vitals Temp: 99 F (37.2 C) Pulse Rate: 76 Resp: 18 BP: (!) 155/90 Patient Position (if appropriate): Lying Oxygen Therapy SpO2: 94 % O2 Device: Room Air Pain: Pain Assessment Pain Scale: 0-10 Pain Score:  0-No pain ADL: ADL Eating: Independent Where Assessed-Eating: Bed level Grooming: Independent Where Assessed-Grooming: Sitting at sink Upper Body Bathing: Independent Where Assessed-Upper Body Bathing: Sitting at sink Lower Body Bathing: Modified independent Where Assessed-Lower Body Bathing: Standing at sink, Sitting at sink Upper Body Dressing: Independent Where Assessed-Upper Body Dressing: Sitting at sink Lower Body Dressing: Modified independent Where Assessed-Lower Body Dressing: Standing at sink Toileting: Modified independent Where Assessed-Toileting: Glass blower/designer: Distant supervision Armed forces technical officer Method: Ambulating Tub/Shower Transfer: Not assessed [employer] Transfer: Not assessed   Therapy/Group: Individual Therapy  Kanae Ignatowski 04/23/2021, 9:33 AM

## 2021-04-23 NOTE — Progress Notes (Addendum)
Patient ID: RMANI KAPUSTA, female   DOB: 1960-09-30, 61 y.o.   MRN: [id number]  Met with pt to give her a team conference update. Her goals are mod/I level and target discharge date is 1/20. Pt is learning her colostomy/illeostomy care. Working on getting a home health agency to assist with wound vac, pt will need at home. Discussed equipment needs and pt feels needs a rolling walker and bedside commode. Have made referral to [facility] for both pieces of equipment. Pt aware will need to have ride come on Friday between 10-12. Awaiting [facility] checking pt's insurance to make sure can accept referral. Wound vac form faxed to [facility]. Continue to work on discharge needs. Pt reports she will have family check on her intermittently and assist with home management.  2:26 PM [facility] is out of network. Have sent to the other agencies to see if any of them can take referral.  2:55 PM Contacted [facility] to see if can get her an appointment await return call. Also reached out to [facility] they took her information and once approved will reach out to her, regarding primary care and PCP. Pt also needs a primary MD to follow her. [facility] is out of network. Await other home health agencies responses

## 2021-04-23 NOTE — Progress Notes (Signed)
PROGRESS NOTE   Subjective/Complaints:  Pt without complaints this morning. Progressing with therapy. Pain is controlled  ROS: Patient denies fever, rash, sore throat, blurred vision, nausea, vomiting, diarrhea, cough, shortness of breath or chest pain, joint or back pain, headache, or mood change.    Objective:   No results found. Recent Labs    04/23/21 0543  WBC 6.7  HGB 8.4*  HCT 27.8*  PLT 399    Recent Labs    04/23/21 0543  NA 139  K 4.6  CL 109  CO2 22  GLUCOSE 102*  BUN 17  CREATININE 1.95*  CALCIUM 8.7*     Intake/Output Summary (Last 24 hours) at 04/23/2021 1258 Last data filed at 04/23/2021 1106 Gross per 24 hour  Intake 480 ml  Output 150 ml  Net 330 ml        Physical Exam: Vital Signs Blood pressure (!) 155/90, pulse 76, temperature 99 F (37.2 C), resp. rate 18, height $RemoveBe'5\' 7"'[id number]$  (1.702 m), weight 93 kg, SpO2 94 %.   Constitutional: No distress . Vital signs reviewed. HEENT: NCAT, EOMI, oral membranes moist Neck: supple Cardiovascular: RRR without murmur. No JVD    Respiratory/Chest: CTA Bilaterally without wheezes or rales. Normal effort    GI/Abdomen: BS +, tender, ostomy sealed, vac sealed Ext: no clubbing, cyanosis, or edema Psych: pleasant and cooperative  Skin: No evidence of breakdown, no evidence of rash, serosang drainage from abd wound vac  Neurologic: Cranial nerves II through XII intact, motor strength is 4/5 in bilateral deltoid, bicep, tricep, grip, hip flexor, knee extensors, ankle dorsiflexor and plantar flexor Sensory exam normal for light touch and pain in all 4 limbs. No limb ataxia or cerebellar signs. No abnormal tone appreciated.   Musculoskeletal: Full range of motion in all 4 extremities. No joint swelling Breasts- left breast enlarged vs Right--ongoing, steristripped  Assessment/Plan: 1. Functional deficits which require 3+ hours per day of interdisciplinary  therapy in a comprehensive inpatient rehab setting. Physiatrist is providing close team supervision and 24 hour management of active medical problems listed below. Physiatrist and rehab team continue to assess barriers to discharge/monitor patient progress toward functional and medical goals  Care Tool:  Bathing    Body parts bathed by patient: Face, Right arm, Left arm, Chest, Front perineal area, Buttocks, Right upper leg, Left upper leg, Right lower leg, Left lower leg     Body parts n/a: Abdomen   Bathing assist Assist Level: Set up assist     Upper Body Dressing/Undressing Upper body dressing   What is the patient wearing?: Pull over shirt    Upper body assist Assist Level: Independent    Lower Body Dressing/Undressing Lower body dressing    Lower body dressing activity did not occur: N/A What is the patient wearing?: Underwear/pull up, Pants     Lower body assist Assist for lower body dressing: Independent with assitive device     Toileting Toileting Toileting Activity did not occur (Clothing management and hygiene only): N/A (no void or bm)  Toileting assist Assist for toileting: Independent with assistive device     Transfers Chair/bed transfer  Transfers assist     Chair/bed transfer  assist level: Supervision/Verbal cueing     Locomotion Ambulation   Ambulation assist      Assist level: Contact Guard/Touching assist Assistive device: Walker-rolling Max distance: 146ft   Walk 10 feet activity   Assist     Assist level: Contact Guard/Touching assist Assistive device: Walker-rolling   Walk 50 feet activity   Assist Walk 50 feet with 2 turns activity did not occur: Safety/medical concerns  Assist level: Contact Guard/Touching assist Assistive device: Walker-rolling    Walk 150 feet activity   Assist Walk 150 feet activity did not occur: Safety/medical concerns  Assist level: Contact Guard/Touching assist Assistive device:  Walker-rolling    Walk 10 feet on uneven surface  activity   Assist Walk 10 feet on uneven surfaces activity did not occur: Safety/medical concerns         Wheelchair     Assist Is the patient using a wheelchair?: No (prohibited by sternal precautions)   Wheelchair activity did not occur: Safety/medical concerns         Wheelchair 50 feet with 2 turns activity    Assist    Wheelchair 50 feet with 2 turns activity did not occur: Safety/medical concerns       Wheelchair 150 feet activity     Assist  Wheelchair 150 feet activity did not occur: Safety/medical concerns       Blood pressure (!) 155/90, pulse 76, temperature 99 F (37.2 C), resp. rate 18, height 5\' 7"  (1.702 m), weight 93 kg, SpO2 94 %.  Medical Problem List and Plan: 1. 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  Routine ostomy care as well as wound VAC changes             -patient may not shower             -ELOS/Goals: 7-10d  -Continue CIR therapies including PT, OT. Team conference today -pt will be essentially on her own at home. Will need to do ostomy care on her own. Will need HHRN for vac or transport to wound care clinic 3 days a week for vac change. 2.  Antithrombotics: -DVT/anticoagulation:  Pharmaceutical: Heparin             -antiplatelet therapy: Aspirin 324 mg daily 3. Pain Management: Robaxin and oxycodone as needed 4. Mood: Provide emotional support             -antipsychotic agents: N/A 5. Neuropsych: This patient is capable of making decisions on her own behalf. 6. Skin/Wound Care: continue VAC changes MWF.   -pt doint a lot of ostomy care 7. Fluids/Electrolytes/Nutrition:   8.  Acute blood  loss anemia.  Multiple transfusions/FFP.  Follow-up CBC stable 7.9  -1/17 hgb up to 8.4 9.  A. fib/RVR 04/06/2021.  Continue amiodarone 200 mg daily/Lopressor 12.5 mg twice daily.  Cardiac rate controlled.  Not an anticoagulation candidate at this time. Vitals:   04/22/21 2120 04/23/21 0640  BP: (!) 168/95 (!) 155/90  Pulse: 77 76  Resp: 19 18  Temp: 98.7 F (37.1 C) 99 F (37.2 C)  SpO2: 98% 94%   1/16 HR controlled 10.  Acute on chronic diastolic congestive heart failure.  Monitor for any signs of fluid overload.  Lasix adjusted 40 mg Monday Wednesday Friday due to AKI  Filed Weights   04/21/21 0406 04/22/21 0600 04/23/21 0600  Weight: 91.6 kg 93 kg 93 kg    11.  AKI/CKD stage III.     I personally reviewed the patient's labs today.  Stable numbers 12.  Obesity.  BMI 37.91.  Dietary follow-up 13.  Breast asymmetry L>R with tenderness, ? Mastitis vs mass, contacted Gen Surgery who did eval pt - no infx suspect, rec yearly mammo as OP Rec a supportive bra,  Sports bra can be brought in per family , K-tape                LOS: 8 days A FACE TO FACE EVALUATION WAS PERFORMED  Meredith Staggers 04/23/2021, 12:58 PM

## 2021-04-24 ENCOUNTER — Ambulatory Visit: Payer: Self-pay

## 2021-04-24 NOTE — Progress Notes (Signed)
Physical Therapy Session Note  Patient Details  Name: Dorothy Harris MRN: [id number] Date of Birth: 09/21/1960  Today's Date: 04/24/2021 PT Amount of Missed Time (min): 60 Minutes PT Missed Treatment Reason: Patient unwilling to participate;Pain  Short Term Goals: Week 1:  PT Short Term Goal 1 (Week 1): STG = LTG d/t ELOS  Skilled Therapeutic Interventions/Progress Updates:    Attempted to see patient for scheduled therapy session, pt declines participation at this time due to headache and not feeling well. Pt reports she has received Tylenol for her headache, declines further intervention. Pt missed 60 min of scheduled therapy session due to refusal.  Therapy Documentation Precautions:  Precautions Precautions: Fall, Sternal Precaution Booklet Issued: Yes (comment) Precaution Comments: monitor O2, HR; abdominal wound vac, colostomy Restrictions Weight Bearing Restrictions: No RUE Weight Bearing: Non weight bearing Other Position/Activity Restrictions: sternal precautions     Therapy/Group: Individual Therapy   Excell Seltzer, PT, DPT, CSRS  04/24/2021, 11:58 AM

## 2021-04-24 NOTE — Discharge Summary (Signed)
Physician Discharge Summary  Patient ID: Dorothy Harris MRN: [id number] DOB/AGE: September 12, 1960 61 y.o.  Admit date: 04/15/2021 Discharge date: 04/26/2021  Discharge Diagnoses:  Principal Problem:   Debility DVT prophylaxis Acute blood loss anemia A. fib/RVR Acute on chronic diastolic congestive heart failure AKI/CKD stage III Obesity Tobacco use  Discharged Condition: Stable  Significant Diagnostic Studies: CT ABDOMEN PELVIS WO CONTRAST  Result Date: 04/02/2021 CLINICAL DATA:  Hernia, complicated. EXAM: CT ABDOMEN AND PELVIS WITHOUT CONTRAST TECHNIQUE: Multidetector CT imaging of the abdomen and pelvis was performed following the standard protocol without IV contrast. COMPARISON:  CT abdomen and pelvis dated 03/24/2021. FINDINGS: Lower chest: New bibasilar consolidations, likely atelectasis. Surgical changes suggestive of ascending aorta replacement. Aneurysmal dilatation of the descending thoracic aorta, similar in caliber and configuration to earlier CT of 03/24/2021, further evaluation limited by the lack of intravascular contrast on today's study. Hepatobiliary: No acute-appearing abnormality within the liver. Stable appearance of the RIGHT hepatic cyst which measures approximally 5 cm greatest dimension. Vicarious excretion of contrast noted within the otherwise normal-appearing gallbladder. No bile duct dilatation is seen. Pancreas: Unremarkable. No pancreatic ductal dilatation or surrounding inflammatory changes. Spleen: Normal in size without focal abnormality. Adrenals/Urinary Tract: Bilateral adrenal fullness, but otherwise unremarkable. Kidneys appear normal without stone or hydronephrosis. Bladder appears normal. Stomach/Bowel: Midline abdominal wall hernia, multicompartmental, containing separate segments of the transverse colon. There is now a dilatation of the ascending colon and cecum (proximal to the hernia site) with associated air-fluid levels, indicating mechanical obstruction at  the hernia site. The descending colon is relatively decompressed indicating complete or high-grade partial obstruction. No associated small bowel dilatation at present. Vascular/Lymphatic: Abdominal aortic aneurysm, stable in the short-term interval, 4.6 cm in maximum diameter (previously 3.9 cm on CT of 06/2017). Further characterization limited by the lack of intravascular contrast. Reproductive: Uterus and bilateral adnexa are unremarkable. Other: Small amount of free fluid in the pelvis. Large amount of free intraperitoneal air within the upper abdomen. Additional extraluminal air within the hernia sac. Findings are compatible with a bowel perforation, likely involving the transverse colon at or immediately adjacent to the hernia sac. Musculoskeletal: No acute-appearing osseous abnormality. Degenerative spondylosis of the thoracolumbar spine, mild in degree. Large amount of apparent soft tissue edema versus soft tissue hematoma is now seen along the LEFT lateral chest wall, incompletely imaged. IMPRESSION: 1. Large amount of free intraperitoneal air within the upper abdomen, with additional extraluminal air within the hernia sac. Findings are compatible with a bowel perforation, likely involving the transverse colon at or immediately adjacent to the hernia sac. 2. Midline abdominal wall hernia, multicompartmental, containing separate segments of the transverse colon. There is now a dilatation of the ascending colon and cecum (proximal to the hernia site) with associated air-fluid levels, indicating mechanical obstruction at the hernia site. The descending colon is relatively decompressed indicating complete or high-grade partial obstruction. 3. Abdominal aortic aneurysm, stable in the short-term interval, 4.6 cm in maximum diameter (previously 3.9 cm on CT of 06/2017). Further characterization limited by the lack of intravascular contrast on today's study. 4. New bibasilar consolidations, likely atelectasis. 5.  Large amount of apparent soft tissue edema versus soft tissue hematoma is now seen along the LEFT lateral chest wall, incompletely imaged. Clinical correlation recommended. Critical Value/emergent results were called by telephone at the time of interpretation on 04/02/2021 at 4:40 pm to provider [facility] , who verbally acknowledged these results. Electronically Signed   By: Franki Cabot M.D.   On: 04/02/2021  16:41   DG Chest 2 View  Result Date: 04/02/2021 CLINICAL DATA:  Effusions.  Distended abdomen. EXAM: CHEST - 2 VIEW COMPARISON:  Two-view chest x-ray 12/24/2 FINDINGS: Heart is enlarged. Right IJ sheath was removed. Left pleural effusion remains. Mild bibasilar atelectasis is present. No significant edema is present. No significant airspace consolidation IMPRESSION: Persistent left pleural effusion and bibasilar atelectasis. Electronically Signed   By: San Morelle M.D.   On: 04/02/2021 09:48   DG Abd 1 View  Result Date: 04/02/2021 CLINICAL DATA:  Pleural effusion, distended abdomen. EXAM: ABDOMEN - 1 VIEW COMPARISON:  Radiographs 03/31/2021, 03/30/2021 and 06/03/2017. CT 03/24/2021. FINDINGS: 0936 hours. Two supine views are submitted. Progressive increased gaseous distention of the right colon which could relate to colonic obstruction within the ventral hernia containing transverse colon on recent CT. There is gas throughout nondistended small bowel and distal colon. No supine evidence of free intraperitoneal air. Mild bibasilar atelectasis and a probable small right pleural effusion are noted. There are degenerative changes within the spine. IMPRESSION: Increased gaseous distention of the right colon which could be due to postoperative ileus or colonic obstruction within the known ventral hernia. Electronically Signed   By: Richardean Sale M.D.   On: 04/02/2021 09:53   DG Abd 1 View  Result Date: 03/30/2021 CLINICAL DATA:  Obstruction of bowel EXAM: ABDOMEN - 1 VIEW COMPARISON:   06/03/2017 FINDINGS: Gas throughout large and small bowel. Mild prominence of the right colon and hepatic flexure. No organomegaly or free air. IMPRESSION: Gas within nondistended small bowel and mildly prominent right colon. Electronically Signed   By: Rolm Baptise M.D.   On: 03/30/2021 12:10   DG Chest Port 1 View  Result Date: 04/09/2021 CLINICAL DATA:  61 year old female status post abdominal surgery on December 27th for incarcerated hernia. Recent surgery for thoracic aortic repair. EXAM: PORTABLE CHEST 1 VIEW COMPARISON:  Noncontrast CT Chest, Abdomen, and Pelvis 04/08/2021 and earlier. FINDINGS: Portable AP semi upright view at 0554 hours. Changes of CABG. Stable cardiac size and mediastinal contours. Improved bibasilar ventilation since 04/06/2021, larger lung volumes. No pneumothorax or pulmonary edema. Visualized tracheal air column is within normal limits. Stable right PICC line, right axillary surgical clips. Paucity of bowel gas in the upper abdomen. IMPRESSION: 1. Improved lung volumes and bibasilar ventilation since 04/06/2021. 2. Stable mediastinal contours status post thoracic aortic repair, CABG. 3. No new cardiopulmonary abnormality. Electronically Signed   By: Genevie Ann M.D.   On: 04/09/2021 08:21   DG CHEST PORT 1 VIEW  Result Date: 04/06/2021 CLINICAL DATA:  61 year old female with history of left-sided pleural effusion status post surgery. EXAM: PORTABLE CHEST 1 VIEW COMPARISON:  Chest x-ray 04/05/2021. FINDINGS: Previously noted enteric tube has been removed. There is a right upper extremity PICC with tip terminating in the mid superior vena cava. Lung volumes are low. There are bibasilar opacities which may reflect areas of atelectasis and/or consolidation. Small left pleural effusion. No definite right pleural effusion. No pneumothorax. No evidence of pulmonary edema. Heart size is mildly enlarged. Upper mediastinal contours are remarkable for prominence of the thoracic aorta,  similar to the prior study, compatible with known aortic aneurysm/dissection better demonstrated on recent chest CTA 03/24/2021. Status post median sternotomy for CABG. IMPRESSION: 1. Postoperative changes and support apparatus, as above. 2. Low lung volumes with bibasilar areas of atelectasis and/or consolidation and small left pleural effusion. 3. Known aortic aneurysm/dissection grossly similar in appearance to prior examinations, as above. Please see prior chest  CT 03/24/2021 for full description. Electronically Signed   By: Vinnie Langton M.D.   On: 04/06/2021 11:01   DG CHEST PORT 1 VIEW  Result Date: 04/05/2021 CLINICAL DATA:  Pneumothorax EXAM: PORTABLE CHEST 1 VIEW COMPARISON:  04/03/2021 FINDINGS: Endotracheal tube is no longer present. Right PICC line and enteric tube again identified. Low lung volumes with bibasilar atelectasis. Similar cardiomediastinal contours. No pneumothorax. IMPRESSION: Lines and tubes as above. Low lung volumes with bibasilar atelectasis. Electronically Signed   By: Macy Mis M.D.   On: 04/05/2021 08:15   DG Chest Port 1 View  Result Date: 04/03/2021 CLINICAL DATA:  PICC line repositioned. EXAM: PORTABLE CHEST 1 VIEW COMPARISON:  Earlier today. FINDINGS: The right PICC has been retracted with its tip approximally 2.5 cm proximal to the superior cavoatrial junction. Endotracheal tube in satisfactory position. Nasogastric tube extending into the stomach. Stable post CABG changes and right axillary surgical clips. Interval minimal left basilar atelectasis and stable minimal right basilar atelectasis. IMPRESSION: 1. Repositioned PICC tube with its tip 2.5 cm proximal to the superior cavoatrial junction. 2. Minimal bibasilar atelectasis. 3. Stable cardiomegaly. Electronically Signed   By: Claudie Revering M.D.   On: 04/03/2021 11:32   DG Chest Port 1 View  Result Date: 04/03/2021 CLINICAL DATA:  61 year old female with a history recent PICC, repair of thoracic aortic  dissection EXAM: PORTABLE CHEST 1 VIEW COMPARISON:  04/02/2021 FINDINGS: Surgical changes of median sternotomy, for repair of acute dissection. Endotracheal tube terminates suitably above the carina. Gastric tube projects over the mediastinum and terminates out of the field of view. Interval placement of right upper extremity PICC with the tip appearing to terminate in the upper right atrium. No pneumothorax. Low lung volumes with asymmetric elevation the right hemidiaphragm. No new confluent airspace disease or interlobular septal thickening. Crowding of the central vasculature given the low lung volumes. IMPRESSION: Interval placement of right upper extremity PICC, with the tip appearing to terminate in the upper right atrium. Unchanged endotracheal tube and gastric tube, with redemonstration of surgical changes of median sternotomy and repair of aortic dissection Electronically Signed   By: Corrie Mckusick D.O.   On: 04/03/2021 09:39   DG CHEST PORT 1 VIEW  Result Date: 04/02/2021 CLINICAL DATA:  NG tube placement and intubation EXAM: PORTABLE CHEST 1 VIEW COMPARISON:  04/02/2021 FINDINGS: Postoperative changes in the mediastinum. Interval placement of an endotracheal tube with tip measuring 4.5 cm above the carina. An enteric tube is present. Tip is off the field of view but below the left hemidiaphragm. Shallow inspiration. Lungs are clear. Mild cardiac enlargement. Dilated aortic arch and descending aorta. Surgical clips in the right axilla. IMPRESSION: Appliances appear in satisfactory location. Cardiac enlargement. Dilated aorta. Lungs are clear. Electronically Signed   By: Lucienne Capers M.D.   On: 04/02/2021 22:01   DG CHEST PORT 1 VIEW  Result Date: 03/30/2021 CLINICAL DATA:  Pleural effusion EXAM: PORTABLE CHEST 1 VIEW COMPARISON:  03/29/2021 FINDINGS: Cardiomegaly. Prior CABG. Interval removal of mediastinal drain. Tortuous, prominent aorta is unchanged. Vascular congestion and bibasilar  atelectasis. No effusions or pneumothorax. IMPRESSION: Postoperative changes.  Bibasilar atelectasis.  No pneumothorax. Cardiomegaly, vascular congestion. Electronically Signed   By: Rolm Baptise M.D.   On: 03/30/2021 06:36   DG CHEST PORT 1 VIEW  Result Date: 03/29/2021 CLINICAL DATA:  Pleural effusion. Chest tube. EXAM: PORTABLE CHEST 1 VIEW COMPARISON:  03/28/2021 FINDINGS: Right internal jugular venous access sheath remains in place. Mediastinal drains remain in place.  Previous median sternotomy and CABG. Cardiomegaly and aortic tortuosity as seen previously. Mild bibasilar atelectasis. No pneumothorax. No measurable pleural effusion. IMPRESSION: Mild bibasilar atelectasis. No pneumothorax. Mild basilar atelectasis. No measurable pleural effusion. Electronically Signed   By: Nelson Chimes M.D.   On: 03/29/2021 08:15   DG CHEST PORT 1 VIEW  Result Date: 03/28/2021 CLINICAL DATA:  Chest tube, ascending aortic replacement. EXAM: PORTABLE CHEST 1 VIEW COMPARISON:  03/27/2021 FINDINGS: Postoperative changes. Mediastinal drain and right internal jugular vascular sheath remain in place, unchanged. Cardiomegaly. Prominence of the aorta again noted, stable. Vascular congestion with left base atelectasis. No effusions or pneumothorax. IMPRESSION: Left base atelectasis, slightly improved since prior study. No pneumothorax. Cardiomegaly, vascular congestion. Electronically Signed   By: Rolm Baptise M.D.   On: 03/28/2021 08:09   DG Abd Portable 1V  Result Date: 03/31/2021 CLINICAL DATA:  Abdominal pain EXAM: PORTABLE ABDOMEN - 1 VIEW COMPARISON:  03/30/2021 FINDINGS: Stomach is not distended. There is mild dilation of small-bowel loops. There is increase in the amount of small bowel gas. Gas is present in colon. IMPRESSION: Interval increase in amount of gas in small bowel loops may suggest ileus. Electronically Signed   By: Elmer Picker M.D.   On: 03/31/2021 09:10   VAS [place] UPPER EXTREMITY VENOUS  DUPLEX  Result Date: 04/07/2021 UPPER VENOUS STUDY  Patient Name:  Dorothy Harris [facility]  Date of Exam:   04/07/2021 Medical Rec #: [id number]      Accession #:    [id number] Date of Birth: 01/04/1961      Patient Gender: F Patient Age:   59 years Exam Location:  [facility] Procedure:      VAS [place] UPPER EXTREMITY VENOUS DUPLEX Referring Phys: Patrick Jupiter GOLD --------------------------------------------------------------------------------  Indications: Swelling Comparison Study: swelling Performing Technologist: Archie Patten RVS  Examination Guidelines: A complete evaluation includes B-mode imaging, spectral Doppler, color Doppler, and power Doppler as needed of all accessible portions of each vessel. Bilateral testing is considered an integral part of a complete examination. Limited examinations for reoccurring indications may be performed as noted.  Right Findings: +----------+------------+---------+-----------+----------+-------+  RIGHT      Compressible Phasicity Spontaneous Properties Summary  +----------+------------+---------+-----------+----------+-------+  Subclavian                 Yes        Yes                         +----------+------------+---------+-----------+----------+-------+  Left Findings: +----------+------------+---------+-----------+----------+-------+  LEFT       Compressible Phasicity Spontaneous Properties Summary  +----------+------------+---------+-----------+----------+-------+  IJV            Full        Yes        Yes                         +----------+------------+---------+-----------+----------+-------+  Subclavian     Full        Yes        Yes                         +----------+------------+---------+-----------+----------+-------+  Axillary       Full        Yes        Yes                         +----------+------------+---------+-----------+----------+-------+  Brachial       Full        Yes        Yes                          +----------+------------+---------+-----------+----------+-------+  Radial         Full                                               +----------+------------+---------+-----------+----------+-------+  Ulnar          Full                                               +----------+------------+---------+-----------+----------+-------+  Cephalic       Full                                               +----------+------------+---------+-----------+----------+-------+  Basilic        Full                                               +----------+------------+---------+-----------+----------+-------+  Summary:  Right: No evidence of thrombosis in the subclavian.  Left: No evidence of deep vein thrombosis in the upper extremity. No evidence of superficial vein thrombosis in the upper extremity.  *See table(s) above for measurements and observations.  Diagnosing physician: Monica Martinez MD Electronically signed by Monica Martinez MD on 04/07/2021 at 11:35:37 AM.    Final    [place] EKG SITE RITE  Result Date: 04/02/2021 If Site Rite image not attached, placement could not be confirmed due to current cardiac rhythm.  CT CHEST ABDOMEN PELVIS WO CONTRAST  Result Date: 04/08/2021 CLINICAL DATA:  Sepsis, recent exploratory laparotomy 04/02/2021 EXAM: CT CHEST, ABDOMEN AND PELVIS WITHOUT CONTRAST TECHNIQUE: Multidetector CT imaging of the chest, abdomen and pelvis was performed following the standard protocol without IV contrast. Examination was performed without intravenous contrast due to renal insufficiency, and evaluation of the vascular structures and solid viscera is limited. Techniques to minimize radiation exposure, such as automated exposure control, adjustment of mA and/or kV according to patient size, or iterative reconstruction, are utilized, when appropriate, to reduce radiation dose to as low as reasonably achievable. COMPARISON:  03/24/2021, 04/02/2021 FINDINGS: CT CHEST FINDINGS Cardiovascular: Interval  postsurgical changes from median sternotomy and ascending thoracic aortic dissection repair. Evaluation is extremely limited without intravenous contrast. 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 Again, evaluation of the vascular lumen is limited without IV contrast. There is evidence of interposition grafts placed between the bilateral common carotid arteries and right subclavian artery. A right-sided PICC extends to the superior vena cava. The heart is unremarkable without pericardial effusion. Mediastinum/Nodes: Postsurgical changes are seen within the anterior mediastinum related to recent median sternotomy. Small amount of fluid within the anterior mediastinum deep to the sternum measures up to 1.9 x 2.8 cm, likely  postoperative hematoma. Thyroid, trachea, and esophagus are grossly unremarkable. No pathologic adenopathy. Lungs/Pleura: Trace bilateral pleural effusions with dependent compressive atelectasis. Airspace disease or pneumothorax. The central airways are patent. Musculoskeletal: No acute displaced fractures. No destructive bony lesions. There is extensive edema throughout the left breast and left lateral chest wall of uncertain etiology. Reconstructed images demonstrate no additional findings. CT ABDOMEN PELVIS FINDINGS Hepatobiliary: High attenuation material within the gallbladder compatible with sludge. Stable caudate cyst within the liver. No intrahepatic duct dilation. Subcapsular hypodensity along the ventral left lobe liver image 56/3 measuring 3.1 x 1.1 cm likely reflects a small amount of subcapsular fluid. Pancreas: Unremarkable unenhanced appearance. Spleen: Unremarkable unenhanced appearance. Adrenals/Urinary Tract: No urinary tract calculi or obstructive uropathy. Adrenals are stable. Bladder is decompressed with a Foley catheter. Stomach/Bowel: Distended loops of proximal small bowel are  identified, measuring up to 4 cm in diameter. There are multiple proximal small-bowel gas fluid levels. Mid to distal jejunum and ileum are decompressed, with diverting ileostomy in the right mid abdomen. Exact transition point between dilated and nondilated bowel is not well visualized. Postsurgical changes are seen from proximal colonic resection. No evidence of bowel wall thickening. Vascular/Lymphatic: Stable dilation of the visualized thoracoabdominal aorta, with diffuse atherosclerosis. The chronic dissection seen previously is not well visualized given the lack of intravenous contrast. No pathologic adenopathy. Reproductive: Uterus and bilateral adnexa are unremarkable. Other: There are 2 surgical drains within the lower pelvis and right hemiabdomen. Small amount of free fluid is seen scattered throughout the abdomen and pelvis. No obvious fluid collection or abscess on this limited unenhanced exam. No evidence of pneumoperitoneum. Postsurgical changes are seen from midline laparotomy, with surgical packing in place at the incision site. There is a small right periumbilical fat containing ventral hernia, though the large ventral hernia seen previously has been completely repaired in the interim. Extensive subcutaneous edema within the bilateral flanks. Musculoskeletal: No acute or destructive bony lesions. Reconstructed images demonstrate no additional findings. IMPRESSION: CT chest: 1. Interval postsurgical changes from ascending thoracic aortic dissection repair. Evaluation is extremely limited given the lack of intravenous contrast. 2. 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 Again, evaluation is extremely limited given the lack of intravenous contrast. 3. Trace bilateral pleural effusions with dependent bilateral lower lobe atelectasis. 4. Marked edema within the left lateral chest wall and left breast. 5.  Aortic Atherosclerosis (ICD10-I70.0). CT abdomen/pelvis: 1. Postsurgical  changes from midline laparotomy, proximal colonic resection, and diverting ileostomy. 2. Proximal small bowel dilation with gas fluid levels, compatible with small-bowel obstruction. Decompression of the mid to distal jejunum and ileum, with no clear transition point identified on this limited exam. 3. Trace free fluid within the abdomen and pelvis, with no obvious abscess on this study limited by the lack of IV contrast. 4. High attenuation material within the gallbladder consistent with gallbladder sludge. No evidence of acute cholecystitis. 5. Interval repair of a large midline ventral hernia. Small residual right periumbilical fat containing ventral hernia identified. 6. Dependent body wall edema. 7.  Aortic Atherosclerosis (ICD10-I70.0). Electronically Signed   By: Randa Ngo M.D.   On: 04/08/2021 16:21    Labs:  Basic Metabolic Panel: Recent Labs  Lab 04/23/21 0543  NA 139  K 4.6  CL 109  CO2 22  GLUCOSE 102*  BUN 17  CREATININE 1.95*  CALCIUM 8.7*    CBC: Recent Labs  Lab 04/23/21 0543  WBC 6.7  HGB 8.4*  HCT 27.8*  MCV 102.6*  PLT  399    CBG: Recent Labs  Lab 04/22/21 0603 04/22/21 1155 04/22/21 1649 04/23/21 0648 04/23/21 1143  GLUCAP 97 107* 96 103* 93   Family history.  Mother with hypertension Sister and father with diabetes.  Maternal aunt with breast cancer.  Denies any colon cancer esophageal cancer or rectal cancer  Brief HPI:   Dorothy Harris is a 61 y.o. 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  She had a repeat CTA to chest March 2019 showed stable aneurysmal dilation of the proximal descending aorta at 4.4 cm and she had a repeat CT of the chest July 2019 showed stable aortic dissection.  The ascending aorta was unremarkable.  She was subsequently lost to  follow-up.  Per chart review lives alone independent sedentary.  Presented 03/24/2021 to [facility] with nonspecific substernal chest pain.  She did receive 2 nitros.  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  The scan was repeated with intravenous contrast and clearly showed acute type a dissection beginning in the aortic root and extending up into the aortic arch to meet up with previous type B dissection.  It did appear to extend up into the arch vessels themselves.  She was transferred to [facility] underwent median sternotomy with replacement of aortic arch under deep hypothermic circulatory arrest with bypass of the innominate left common carotid and left subclavian arteries 03/25/2021 per Dr. Caffie Pinto.  She did require 3 units packed red blood cells 2 units platelets during surgery and 4 fresh frozen plasma.  She was extubated 03/26/2021.  Hospital course subsequently developed abdominal pain needing to go back to the operating room 12/27.  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  Extended right hemicolectomy and ileostomy performed per Dr.Lovick and wound VAC applied.  She did require initial TPN.  Acute blood loss anemia monitor latest hemoglobin 7.8.  A. fib/RVR 12/31 intraoperative echocardiogram with ejection fraction 65% she did require amiodarone was not a candidate for anticoagulation.  Acute on chronic diastolic congestive heart failure complicated by acute hypoxic respiratory failure she was weaned from oxygen diuresis as directed.  AKI 1.83-2.30 with Lasix therapy adjusted.  She was cleared to begin subcutaneous heparin for DVT prophylaxis.  Therapy evaluations completed due to patient decreased functional mobility was admitted for a comprehensive rehab program.   Hospital Course: Dorothy Harris was admitted to rehab  04/15/2021 for inpatient therapies to consist of PT, ST and OT at least three hours five days a week. Past admission physiatrist, therapy team and rehab RN have worked together to provide customized collaborative inpatient rehab.  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  Wound VAC was in place during her hospitalization in the fall per general surgery changed to wet-to-dry dressings twice daily at time of discharge.  Subcutaneous heparin for DVT prophylaxis as well as aspirin therapy.  Pain managed with use of Robaxin and oxycodone.  Acute blood loss anemia stable no bleeding episodes.  A. fib with RVR amiodarone Lopressor as advised no anticoagulation at this time.  Acute on chronic diastolic congestive heart failure monitoring for any signs of fluid overload Lasix therapy as directed.  AKI on CKD stage III stable numbers will need outpatient follow-up.  Obesity BMI 37.91 dietary follow-up.  Patient did have some breast asymmetry left greater than right General surgery did not evaluate patient patient at this time no infection suspected recommend yearly mammograms.   Blood pressures were monitored on TID basis and soft and monitored     Rehab course: During patient's stay in rehab weekly team conferences were held to monitor patient's progress, set goals and discuss barriers to discharge. At admission, patient required minimal assist 10 feet rolling walker minimal assist supine to sit  Physical exam.  Blood pressure 132/80 pulse 66 temperature 98 respirations 18 oxygen saturation is 96% room air Constitutional.  No acute  distress HEENT Head.  Normocephalic and atraumatic Eyes.  Pupils round and reactive to light no discharge without nystagmus Neck.  Supple nontender no JVD without thyromegaly Cardiac regular rate rhythm moderate extra sounds or murmur heard Abdomen.  Soft nontender positive bowel sounds ileostomy in place with wound VAC Respiratory effort normal no respiratory distress without wheeze Neurologic.  Alert oriented.  Motor strength 4/5 in bilateral deltoid bicep tricep grip hip flexors knee extensors ankle dorsi plantarflexion.  He/She  has had improvement in activity tolerance, balance, postural control as well as ability to compensate for deficits. He/She has had improvement in functional use RUE/LUE  and RLE/LLE as well as improvement in awareness.  Sit to stand from wheelchair height supervision.  Ambulates 150 feet supervision rolling walker.  Ambulatory into the bathroom rolling walker.  Patient use rolling walker to ambulate to toilet with distant supervision.  Toileting modified independent.  Full family teaching completed plan discharged home       Disposition: Discharged to home    Diet: Regular  Special Instructions: No driving smoking or alcohol  Routine ileostomy care.  Wet-to-dry dressings twice daily  Medications at discharge 1.  Tylenol as needed 2.  Amiodarone 200 mg p.o. daily 3.  Aspirin 324 mg p.o. daily 4.  Ferrous sulfate 325 mg p.o. daily 5.  Robaxin 500 mg every 6 hours as needed 6.  Lopressor 25 mg p.o. twice daily 7.  Multivitamin daily 8.  Oxycodone 5 to 10 mg every 4 hours as needed pain 9.  Protonix 40 mg p.o. daily 10.  Lipitor 80 mg daily   30-35 minutes were spent completing discharge summary and discharge planning  Discharge Instructions     Amb Referral to [facility]   Complete by: As directed    Evaluate and ongoing treat for routine ostomy care   Reason for referral modifiers: Education and instruction in ostomy irrigation techniques         Follow-up Information     Kirsteins, Luanna Salk, MD Follow up.   Specialty: Physical Medicine and Rehabilitation Why: No formal follow-up needed Contact information: [place] [address] [phone]         Jesusita Oka, MD Follow up.   Specialty: Surgery Why: Call for appointment Contact information: [address] [place] [address] [phone]         Gaye Pollack, MD Follow up.   Specialty: Cardiothoracic Surgery Why: Call for appointment Contact information: [place]  [place] [address] [phone]                 Signed: Lavon Paganini [place] 04/26/2021, 5:27 AM

## 2021-04-24 NOTE — Progress Notes (Signed)
Occupational Therapy Session Note  Patient Details  Name: Dorothy Harris MRN: [id number] Date of Birth: 18-Apr-1960  Today's Date: 04/24/2021 OT Individual Time: 0905-0256 OT Individual Time Calculation (min): 43 min    Short Term Goals: Week 1:  OT Short Term Goal 1 (Week 1): STG = LTG 2/2 ELOS   Skilled Therapeutic Interventions/Progress Updates:    Pt greeted at time of session bed level resting agreeable to OT session with encouragement. Pt stating she does not like AM sessions and does not like to do therapy before wound vac changes, however agreeable to try. Supine > sit Mod I and walked to wheelchair supervision with therapist assist for wound vac. Set up at sink for UB/LB bathing which she performed with Supervision/Set up. Educated on use of AE in the future if needed to thoroughly wash feet and back. UB/LB dress with set up assist excluding socks, attempted to don but pt stating "I cant do it" and did not want to attempt again. Therapist assist donning new socks. Note K tape on L breast starting to come off and reapplied to help decrease swelling. Pt in bed resting call bell in reach all needs met in prep for wound vac change. Note pt able to perform ADL within sternal precautions but needed cues for sit <> stands and bed mobility.   Therapy Documentation Precautions:  Precautions Precautions: Fall, Sternal Precaution Booklet Issued: Yes (comment) Precaution Comments: monitor O2, HR; abdominal wound vac, colostomy Restrictions Weight Bearing Restrictions: No RUE Weight Bearing: Non weight bearing Other Position/Activity Restrictions: sternal precautions     Therapy/Group: Individual Therapy  Viona Gilmore 04/24/2021, 7:14 AM

## 2021-04-24 NOTE — Progress Notes (Signed)
Patient ID: Dorothy Harris, female   DOB: [birth year], 61 y.o.   MRN: [id number]  Continuing to attempt to find a home health agency to accept pt. Have reached out to [facility] to see when they could see her.Await return calls. Dan-PA aware and reports surgery to come by and look at wound today.

## 2021-04-24 NOTE — Consult Note (Addendum)
[place] Nurse wound follow up Patient receiving care in [facility] 4W17 Plan for discharge on Friday 1/20. I will communicate with the surgical team for possible removal of vac on Friday.  Wound type: Surgical midline abdominal  Measurement: 18 x 5.5 x 3.5 Wound bed: Pink /red granulation tissue with small amount of fatty tissue in the center of the wound (see photo taken today by Risa Grill, PA for rehab) Drainage (amount, consistency, odor) sanguinous in the canister Periwound: intact Dressing procedure/placement/frequency: Removed one piece of black foam. Replaced one piece of black foam. Drape applied. Immediate seal obtained at 125 mmHg   Dressing kits placed in top drawer in room   [place] Nurse ostomy follow up Stoma type/location: RLQ ileostomy Stomal assessment/size: 1 1/2" pink oval just above skin level with sutures intact  Peristomal assessment: intact Treatment options for stomal/peristomal skin: barrier ring Output: thin brown Ostomy pouching: 2pc. 2 1/4" Education provided:  Patient completed the entire pouch removal and new pouch placement. She is doing very well and I believe will be ready for discharge on Friday   Referral has been completed for follow-up in the ostomy clinic.   Enrolled patient in [facility] Start Discharge program: Yes   WOC to follow M/W/F for vac changes and ostomy education and teaching.   Cathlean Marseilles Tamala Julian, MSN, RN, [place], Lysle Pearl, [facility] Wound Treatment Associate Pager [phone]

## 2021-04-24 NOTE — Progress Notes (Signed)
Physical Therapy Session Note  Patient Details  Name: Dorothy Harris MRN: [id number] Date of Birth: 05-06-1960  Today's Date: 04/24/2021 PT Individual Time: 6067-7034, 0352-4818 PT Individual Time Calculation (min): 28 min, 48 min   Short Term Goals: Week 1:  PT Short Term Goal 1 (Week 1): STG = LTG d/t ELOS  Skilled Therapeutic Interventions/Progress Updates:    Session 1: pt received in bed and agreeable to therapy. No complaint of pain, managed on medication s/p wound care shortly before this session. Sup>sit with supervision. Pt agreeable to in room activity d/t fatigue and short session. Pt performed the following exercises to promote LE strength and endurance: Sit to stand 3 x 6 with RW available but not used for UE support for balance challenge. Split stance knee drives for single leg stance stability and endurance, 3 x 10 BIL, RW for BUE progressed to UUE support on 3rd set. Pt directed in side steps wit YTB at knees, x ~74min. When told she would do 1 more set, pt stated "I'm done with this," and removed band and gait belt. Pt initiated returning to bed and did so with supervision. Pt remained in air bed with all 4 rails up at end of session.   Session 2: pt received in bed and agreeable to therapy. Pt reports headache, premedicated. Rest and positioning provided as needed. Bed mobility mod I. ambulatory transfer to toilet with distant supervision, mod I for 3/3 toileting tasks. Pt ambulated to w/c and Pt transported to therapy gym for time management and energy conservation. Pt participated in kinetron 4 x 3 min with 2 min rest break at 50 cm/sec for low impact LE strengthening to minimize worsening headache. Pt ambulated 2 x ~300 ft with RW, distant supervision for endurance, seated rest breaks between. Pt then stated she was done with therapy, and ambulated back to room in same manner. Returned to bed mod I and remained with all 4 rails in place and all needs in reach.   Therapy  Documentation Precautions:  Precautions Precautions: Fall, Sternal Precaution Booklet Issued: Yes (comment) Precaution Comments: monitor O2, HR; abdominal wound vac, colostomy Restrictions Weight Bearing Restrictions: No RUE Weight Bearing: Non weight bearing Other Position/Activity Restrictions: sternal precautions General:      Therapy/Group: Individual Therapy  Mickel Fuchs 04/24/2021, 9:22 AM

## 2021-04-24 NOTE — Progress Notes (Signed)
Patient ID: Dorothy Harris, female   DOB: 06/17/1960, 61 y.o.   MRN: [id number] Referral made to the Wound Clinic at [facility] which is closer to pt. They will contact her to set up appointment. Have gotten [facility] to accept PT & RN referral. Equipment ordered and should be delivered to room prior to discharge Friday. Pt will need to be educated on abdominal wound since wound vac will be removed on Friday and she will need to do wet to dry dressing BID. Have set up pt with [facility] [employer] so can use to go to follow up appointments. Pt is doing better this afternoon felt sick after wound vac change this am. Work on discharge Friday.

## 2021-04-25 ENCOUNTER — Other Ambulatory Visit (HOSPITAL_COMMUNITY): Payer: Self-pay

## 2021-04-25 MED ORDER — ACETAMINOPHEN 325 MG PO TABS: 325.0000 mg | ORAL_TABLET | ORAL | Status: DC | PRN

## 2021-04-25 MED ORDER — METHOCARBAMOL 500 MG PO TABS
500.0000 mg | ORAL_TABLET | Freq: Four times a day (QID) | ORAL | 0 refills | Status: DC | PRN
Start: 2021-04-25 — End: 2021-12-25
  Filled 2021-04-25: qty 60, 15d supply, fill #0

## 2021-04-25 MED ORDER — AMIODARONE HCL 200 MG PO TABS
200.0000 mg | ORAL_TABLET | Freq: Every day | ORAL | 0 refills | Status: DC
  Filled 2021-04-25: qty 30, 30d supply, fill #0

## 2021-04-25 MED ORDER — ATORVASTATIN CALCIUM 80 MG PO TABS
80.0000 mg | ORAL_TABLET | Freq: Every day | ORAL | 0 refills | Status: DC
  Filled 2021-04-25: qty 30, 30d supply, fill #0

## 2021-04-25 MED ORDER — PANTOPRAZOLE SODIUM 40 MG PO TBEC
40.0000 mg | DELAYED_RELEASE_TABLET | Freq: Every day | ORAL | 0 refills | Status: DC
  Filled 2021-04-25: qty 30, 30d supply, fill #0

## 2021-04-25 MED ORDER — METOPROLOL TARTRATE 25 MG PO TABS
25.0000 mg | ORAL_TABLET | Freq: Two times a day (BID) | ORAL | Status: DC
  Administered 2021-04-25 – 2021-04-26 (×2): 25 mg via ORAL
  Filled 2021-04-25 (×2): qty 1

## 2021-04-25 MED ORDER — FERROUS SULFATE 325 (65 FE) MG PO TABS
325.0000 mg | ORAL_TABLET | Freq: Every day | ORAL | 0 refills | Status: AC
Start: 2021-04-25 — End: ?
  Filled 2021-04-25: qty 30, 30d supply, fill #0

## 2021-04-25 MED ORDER — METOPROLOL TARTRATE 25 MG PO TABS
12.5000 mg | ORAL_TABLET | Freq: Two times a day (BID) | ORAL | 0 refills | Status: DC
  Filled 2021-04-25: qty 60, 60d supply, fill #0

## 2021-04-25 MED ORDER — OXYCODONE HCL 5 MG PO TABS
5.0000 mg | ORAL_TABLET | ORAL | 0 refills | Status: DC | PRN
  Filled 2021-04-25: qty 30, 3d supply, fill #0

## 2021-04-25 MED ORDER — ASPIRIN 81 MG PO CHEW: 324.0000 mg | CHEWABLE_TABLET | Freq: Every day | ORAL | Status: DC

## 2021-04-25 MED ORDER — ACETAMINOPHEN 325 MG PO TABS: 650.0000 mg | ORAL_TABLET | Freq: Four times a day (QID) | ORAL | Status: DC

## 2021-04-25 NOTE — Progress Notes (Signed)
Occupational Therapy Discharge Summary  Patient Details  Name: Dorothy Harris MRN: [id number] Date of Birth: [birth year]   Patient has met 10 of 10 long term goals due to improved activity tolerance, improved balance, and ability to compensate for deficits.  Patient to discharge at overall Modified Independent level.    Patient's care partner is independent to provide the necessary physical assistance at discharge with high level home management of grocery shopping, taking out trash, etc.  Pt education only. No family present at sessions.  Pt is independent with functional cognition.   HEP provided.  Copy placed in medical chart.   Reasons goals not met: n/a  Recommendation:  Patient will benefit from ongoing skilled OT services in home health setting to continue to advance functional skills in the area of iADL.  Equipment: No equipment provided - pt plans to buy a toilet riser on her own. Does not want a BSC.  Reasons for discharge: treatment goals met  Patient/family agrees with progress made and goals achieved: Yes  OT Discharge Precautions/Restrictions  Precautions Precautions: Fall;Sternal Restrictions RUE Weight Bearing: Non weight bearing Other Position/Activity Restrictions: sternal precautions   ADL ADL Eating: Independent Where Assessed-Eating: Bed level Grooming: Independent Where Assessed-Grooming: Sitting at sink Upper Body Bathing: Independent Where Assessed-Upper Body Bathing: Sitting at sink Lower Body Bathing: Modified independent Where Assessed-Lower Body Bathing: Standing at sink, Sitting at sink Upper Body Dressing: Independent Where Assessed-Upper Body Dressing: Sitting at sink Lower Body Dressing: Modified independent Where Assessed-Lower Body Dressing: Standing at sink Toileting: Modified independent Where Assessed-Toileting: Glass blower/designer: Modified Programmer, applications Method: Human resources officer: Not  assessed Social research officer, government: Not assessed ADL Comments: Pt unable to shower at this time, but she can complete a step in shower transfer. Vision Baseline Vision/History: 1 Wears glasses Patient Visual Report: No change from baseline Visual Fields: No apparent deficits Perception  Perception: Within Functional Limits Praxis Praxis: Intact Cognition Overall Cognitive Status: Within Functional Limits for tasks assessed Arousal/Alertness: Awake/alert Orientation Level: Oriented X4 Year: 2023 Month: January Day of Week: Correct Immediate Memory Recall: Sock;Blue;Bed Memory Recall Sock: Without Cue Memory Recall Blue: Without Cue Memory Recall Bed: Without Cue Awareness: Appears intact Problem Solving: Appears intact Safety/Judgment: Appears intact Sensation Sensation Light Touch: Appears Intact Hot/Cold: Appears Intact Proprioception: Appears Intact Stereognosis: Appears Intact Coordination Gross Motor Movements are Fluid and Coordinated: Yes Fine Motor Movements are Fluid and Coordinated: Yes Motor  Motor Motor - Discharge Observations: limited by sternal precautions, abdominal wound -limited in full trunk flexion Mobility  Bed Mobility Supine to Sit: Independent Sit to Supine: Independent Transfers Sit to Stand: Independent with assistive device Stand to Sit: Independent with assistive device  Trunk/Postural Assessment  Postural Control Postural Control: Within Functional Limits  Balance Static Sitting Balance Static Sitting - Level of Assistance: 7: Independent Dynamic Sitting Balance Dynamic Sitting - Level of Assistance: 7: Independent Static Standing Balance Static Standing - Level of Assistance: 6: Modified independent (Device/Increase time) Dynamic Standing Balance Dynamic Standing - Level of Assistance: 6: Modified independent (Device/Increase time) Extremity/Trunk Assessment RUE Assessment RUE Assessment: Within Functional Limits LUE  Assessment LUE Assessment: Within Functional Limits   Dorothy Harris 04/25/2021, 9:04 AM

## 2021-04-25 NOTE — Progress Notes (Signed)
Inpatient Rehabilitation Discharge Medication Review by a Pharmacist   A complete drug regimen review was completed for this patient to identify any potential clinically significant medication issues.   High Risk Drug Classes Is patient taking? Indication by Medication  Antipsychotic No    Anticoagulant No    Antibiotic No   Opioid Yes Oxycodone PRN pain  Antiplatelet Yes Aspirin - CVA  Hypoglycemics/insulin No   Vasoactive Medication Yes Metoprolol for HTN  Chemotherapy No    Other Yes  Robaxin - spasms Amiodarone - AF Protonix - GERD Ferrous sulfate - anemia        Type of Medication Issue Identified Description of Issue Recommendation(s)  Drug Interaction(s) (clinically significant)        Duplicate Therapy        Allergy        No Medication Administration End Date        Incorrect Dose        Additional Drug Therapy Needed      Significant med changes from prior encounter (inform family/care partners about these prior to discharge).      Other            Clinically significant medication issues were identified that warrant physician communication and completion of prescribed/recommended actions by midnight of the next day:  No   Time spent performing this drug regimen review (minutes):  15 min   Onnie Boer, PharmD, [place], AAHIVP, CPP Infectious Disease Pharmacist 04/25/2021 9:58 AM

## 2021-04-25 NOTE — Progress Notes (Signed)
Occupational Therapy Session Note  Patient Details  Name: Dorothy Harris MRN: [id number] Date of Birth: 02/20/1961  Today's Date: 04/25/2021 OT Individual Time: 1308-6578 OT Individual Time Calculation (min): 65 min  and Today's Date: 04/25/2021 OT Missed Time: 10 Minutes Missed Time Reason: Patient fatigue   Short Term Goals: Week 1:  OT Short Term Goal 1 (Week 1): STG = LTG 2/2 ELOS  Skilled Therapeutic Interventions/Progress Updates:    Pt received in room sitting on EOB. She declined b/d today stating she completed it yesterday. From EOB pt was able to easily doff and don socks.   Pt stated "I just want to exercise today".   Pt able to move around room with RW with mod I. In hallway therapist carried her wound vac while pt ambulated with mod I to therapy gym.  Walker bag and walker balls provided as her walker was squeeking on the floor.  In gym, pt worked on sit to stands with legs only from slightly elevated surface.  Pt given a printed HEP program of: -resisted band hip abduction - resisted band knee extension -heel raises -standing knee flex -standing hamstring curls -standing hip abduction  -seated small arm circles with arms in front of body adhering to sternal precautions  Pt participated well and then stated she was too tired and wanted to go back to the room.  Ambulated back to room with RW, with mod I.  Pt opted to sit in wc.  Chair alarm on and all needs met.  (Even though pt is technically at a mod I level, she is still connected to a wound vac so should have A in room until it is removed).     Therapy Documentation Precautions:  Precautions Precautions: Fall, Sternal Precaution Booklet Issued: Yes (comment) Precaution Comments: monitor O2, HR; abdominal wound vac, colostomy Restrictions Weight Bearing Restrictions: No RUE Weight Bearing: Non weight bearing Other Position/Activity Restrictions: sternal precautions    Vital Signs: Therapy Vitals Temp:  98.3 F (36.8 C) Temp Source: Oral Pulse Rate: 74 Resp: 14 BP: (!) 165/95 Patient Position (if appropriate): Lying Oxygen Therapy SpO2: 98 % O2 Device: Room Air Pain: Pain Assessment Pain Scale: 0-10 Pain Score: 0-No pain ADL: ADL Eating: Independent Where Assessed-Eating: Bed level Grooming: Independent Where Assessed-Grooming: Sitting at sink Upper Body Bathing: Independent Where Assessed-Upper Body Bathing: Sitting at sink Lower Body Bathing: Modified independent Where Assessed-Lower Body Bathing: Standing at sink, Sitting at sink Upper Body Dressing: Independent Where Assessed-Upper Body Dressing: Sitting at sink Lower Body Dressing: Modified independent Where Assessed-Lower Body Dressing: Standing at sink Toileting: Modified independent Where Assessed-Toileting: Glass blower/designer: Modified Programmer, applications Method: Human resources officer: Not assessed Social research officer, government: Not assessed ADL Comments: Pt unable to shower at this time, but she can complete a step in shower transfer.    Therapy/Group: Individual Therapy  Lacinda Curvin 04/25/2021, 9:06 AM

## 2021-04-25 NOTE — Progress Notes (Signed)
Physical Therapy Session Note  Patient Details  Name: Dorothy Harris MRN: [id number] Date of Birth: Dec 05, 1960  Today's Date: 04/25/2021 PT Individual Time: 1415-1455 PT Individual Time Calculation (min): 40 min  and Today's Date: 04/25/2021 PT Missed Time: 35 Minutes Missed Time Reason: Patient unwilling to participate;Patient fatigue  Short Term Goals: Week 1:  PT Short Term Goal 1 (Week 1): STG = LTG d/t ELOS   Skilled Therapeutic Interventions/Progress Updates:  Pt received supine in bed, denied pain but reported high levels of fatigue and discomfort. Pt reported having high BP and requested BP meds, nursing notified and informed pt she would receive them later. Emphasis of session on DC planning and HEP design. Pt had several questions regarding her surgeries and "what happened to her", in which therapist educated her on her procedures and rationale behind them. Pt appeared to not know she had a sternotomy performed, educated pt on procedure and importance of sternal precautions, which pt reported she finally understood. Pt had several questions regarding her colostomy bag, in which therapist answered. Discussed HEP w/pt who said she did not care for it. Max encouragement to perform walking program at home to improve endurance, strength and work towards goal of gait without a RW in March when she returns to work. Pt very receptive and verbalized understanding, provided handout of walking program and reminded pt to perform activity w/her sister or aunt who can provide S*. Pt then refused remainder of session, stating she was too fatigued and had "learned everything". Pt was left supine in bed, all needs in reach. Missed 35 minutes of skilled PT due to pt refusal/fatigue.   Therapy Documentation Precautions:  Precautions Precautions: Fall, Sternal Precaution Booklet Issued: Yes (comment) Precaution Comments: monitor O2, HR; abdominal wound vac, colostomy Restrictions Weight Bearing  Restrictions: No RUE Weight Bearing: Non weight bearing Other Position/Activity Restrictions: sternal precautions   Therapy/Group: Individual Therapy Cruzita Lederer Mackinze Criado, PT, DPT  04/25/2021, 7:51 AM

## 2021-04-25 NOTE — Progress Notes (Signed)
Physical Therapy Session Note  Patient Details  Name: Dorothy Harris MRN: [id number] Date of Birth: 21-Jan-1961  Today's Date: 04/25/2021 PT Individual Time: 1015-1109 PT Individual Time Calculation (min): 54 min   Short Term Goals: Week 1:  PT Short Term Goal 1 (Week 1): STG = LTG d/t ELOS  Skilled Therapeutic Interventions/Progress Updates:   Received pt sitting in WC, pt agreeable to PT treatment, and denied any pain during session. Session with emphasis on discharge planning, functional mobility/transfers, generalized strengthening, dynamic standing balance/coordination, gait training, simulated car transfers, stair navigation, and improved activity tolerance. Pt transported to/from room in [facility] dependently for time management purposes. Pt performed ambulatory simulated car transfer with RW and supervision and ambulated 43ft on uneven surfaces (ramp) with RW and supervision with total A to manage wound vac. Pt then navigated 12 steps with 2 rails and supervision alternating ascending and descending with a step to and step through pattern. Pt transferred on/off Nustep mod I with RW and performed BLE strengthening only (to maintain sternal precautions) at workload 3 for 5 minutes for a total of 206 steps with emphasis on cardiovascular endurance with 4 rest breaks and maximal encouragement as pt frequently stopping and stating "I can't do no more". Sit<>stand mod I with RW and pt ambulated 161ft with RW and supervision with total A to manage wound vac - plan to remove wound vac prior to D/C tomorrow. Pt requested to return to room to change ostomy bag and ambulated in/out of bathroom with RW and supervision. Pt able to direct care to empty ostomy bag and requested to return to bed. Sit<>supine independently and concluded session with pt semi-reclined in bed, needs within reach, and bed alarm on. Provided pt with fresh drink.   Therapy Documentation Precautions:  Precautions Precautions: Fall,  Sternal Precaution Booklet Issued: Yes (comment) Precaution Comments: monitor O2, HR; abdominal wound vac, colostomy Restrictions Weight Bearing Restrictions: No RUE Weight Bearing: Non weight bearing Other Position/Activity Restrictions: sternal precautions  Therapy/Group: Individual Therapy Alfonse Alpers PT, DPT   04/25/2021, 7:32 AM

## 2021-04-25 NOTE — Plan of Care (Signed)
Problem: RH Furniture Transfers Goal: LTG Patient will perform furniture transfers w/assist (OT/PT) Description: LTG: Patient will perform furniture transfers  with assistance (OT/PT). Outcome: Completed/Met   Problem: RH Balance Goal: LTG Patient will maintain dynamic sitting balance (PT) Description: LTG:  Patient will maintain dynamic sitting balance with assistance during mobility activities (PT) Outcome: Completed/Met Goal: LTG Patient will maintain dynamic standing balance (PT) Description: LTG:  Patient will maintain dynamic standing balance with assistance during mobility activities (PT) Outcome: Completed/Met   Problem: Sit to Stand Goal: LTG:  Patient will perform sit to stand with assistance level (PT) Description: LTG:  Patient will perform sit to stand with assistance level (PT) Outcome: Completed/Met   Problem: RH Bed Mobility Goal: LTG Patient will perform bed mobility with assist (PT) Description: LTG: Patient will perform bed mobility with assistance, with/without cues (PT). Outcome: Completed/Met   Problem: RH Bed to Chair Transfers Goal: LTG Patient will perform bed/chair transfers w/assist (PT) Description: LTG: Patient will perform bed to chair transfers with assistance (PT). Outcome: Completed/Met   Problem: RH Car Transfers Goal: LTG Patient will perform car transfers with assist (PT) Description: LTG: Patient will perform car transfers with assistance (PT). Outcome: Completed/Met   Problem: RH Ambulation Goal: LTG Patient will ambulate in controlled environment (PT) Description: LTG: Patient will ambulate in a controlled environment, # of feet with assistance (PT). Outcome: Completed/Met Goal: LTG Patient will ambulate in home environment (PT) Description: LTG: Patient will ambulate in home environment, # of feet with assistance (PT). Outcome: Completed/Met

## 2021-04-25 NOTE — Progress Notes (Signed)
PROGRESS NOTE   Subjective/Complaints:  Pt doing fairly well. Pain controlled. Doing ostomy care  ROS: Patient denies fever, rash, sore throat, blurred vision, nausea, vomiting, diarrhea, cough, shortness of breath or chest pain, joint or back pain, headache, or mood change.    Objective:   No results found. Recent Labs    04/23/21 0543  WBC 6.7  HGB 8.4*  HCT 27.8*  PLT 399    Recent Labs    04/23/21 0543  NA 139  K 4.6  CL 109  CO2 22  GLUCOSE 102*  BUN 17  CREATININE 1.95*  CALCIUM 8.7*     Intake/Output Summary (Last 24 hours) at 04/25/2021 1103 Last data filed at 04/25/2021 0810 Gross per 24 hour  Intake 720 ml  Output 250 ml  Net 470 ml        Physical Exam: Vital Signs Blood pressure (!) 165/95, pulse 74, temperature 98.3 F (36.8 C), temperature source Oral, resp. rate 14, height $RemoveBe'5\' 7"'qtjpvrYhG$  (1.702 m), weight 90.7 kg, SpO2 98 %.   Constitutional: No distress . Vital signs reviewed. HEENT: NCAT, EOMI, oral membranes moist Neck: supple Cardiovascular: RRR without murmur. No JVD    Respiratory/Chest: CTA Bilaterally without wheezes or rales. Normal effort    GI/Abdomen: BS +, non-tender, non-distended Ext: no clubbing, cyanosis, or edema Psych: pleasant and cooperative  Skin: No evidence of breakdown, no evidence of rash, serosang drainage from abd wound vac present. Ostomy sealed. Neurologic: Cranial nerves II through XII intact, motor strength is 4/5 in bilateral deltoid, bicep, tricep, grip, hip flexor, knee extensors, ankle dorsiflexor and plantar flexor.  Musculoskeletal: Full range of motion in all 4 extremities. No joint swelling Breasts- left breast enlarged vs Right--no change  Assessment/Plan: 1. Functional deficits which require 3+ hours per day of interdisciplinary therapy in a comprehensive inpatient rehab setting. Physiatrist is providing close team supervision and 24 hour management  of active medical problems listed below. Physiatrist and rehab team continue to assess barriers to discharge/monitor patient progress toward functional and medical goals  Care Tool:  Bathing    Body parts bathed by patient: Face, Right arm, Left arm, Chest, Front perineal area, Buttocks, Right upper leg, Left upper leg, Right lower leg, Left lower leg     Body parts n/a: Abdomen   Bathing assist Assist Level: Independent with assistive device     Upper Body Dressing/Undressing Upper body dressing   What is the patient wearing?: Pull over shirt    Upper body assist Assist Level: Independent    Lower Body Dressing/Undressing Lower body dressing    Lower body dressing activity did not occur: N/A What is the patient wearing?: Underwear/pull up, Pants     Lower body assist Assist for lower body dressing: Independent with assitive device     Toileting Toileting Toileting Activity did not occur (Clothing management and hygiene only): N/A (no void or bm)  Toileting assist Assist for toileting: Independent with assistive device     Transfers Chair/bed transfer  Transfers assist     Chair/bed transfer assist level: Independent with assistive device Chair/bed transfer assistive device: Programmer, multimedia   Ambulation assist  Assist level: Contact Guard/Touching assist Assistive device: Walker-rolling Max distance: 175ft   Walk 10 feet activity   Assist     Assist level: Contact Guard/Touching assist Assistive device: Walker-rolling   Walk 50 feet activity   Assist Walk 50 feet with 2 turns activity did not occur: Safety/medical concerns  Assist level: Contact Guard/Touching assist Assistive device: Walker-rolling    Walk 150 feet activity   Assist Walk 150 feet activity did not occur: Safety/medical concerns  Assist level: Contact Guard/Touching assist Assistive device: Walker-rolling    Walk 10 feet on uneven surface   activity   Assist Walk 10 feet on uneven surfaces activity did not occur: Safety/medical concerns         Wheelchair     Assist Is the patient using a wheelchair?: No (prohibited by sternal precautions)   Wheelchair activity did not occur: Safety/medical concerns         Wheelchair 50 feet with 2 turns activity    Assist    Wheelchair 50 feet with 2 turns activity did not occur: Safety/medical concerns       Wheelchair 150 feet activity     Assist  Wheelchair 150 feet activity did not occur: Safety/medical concerns       Blood pressure (!) 165/95, pulse 74, temperature 98.3 F (36.8 C), temperature source Oral, resp. rate 14, height 5\' 7"  (1.702 m), weight 90.7 kg, SpO2 98 %.  Medical Problem List and Plan: 1. 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  Routine ostomy care as well as wound VAC changes             -patient may not shower             -ELOS/Goals: 1/20, mod I goals -Continue CIR therapies including PT, OT  Pt doing ostomy changes. Education re: wound care tomorrow after vac removed.  2.  Antithrombotics: -DVT/anticoagulation:  Pharmaceutical: Heparin             -antiplatelet therapy: Aspirin 324 mg daily 3. Pain Management: Robaxin and oxycodone as needed 4. Mood: Provide emotional support             -antipsychotic agents: N/A 5. Neuropsych: This patient is capable of making decisions on her own behalf. 6. Skin/Wound Care: continue VAC changes MWF.   -pt doint a lot of ostomy care 7. Fluids/Electrolytes/Nutrition:   8.  Acute blood loss anemia.  Multiple transfusions/FFP.  Follow-up CBC stable 7.9  -1/17 hgb up to 8.4 9.  A. fib/RVR 04/06/2021.   Continue amiodarone 200 mg daily/Lopressor 12.5 mg twice daily.  Cardiac rate controlled.  Not an anticoagulation candidate at this time. Vitals:   04/24/21 1949 04/25/21 0551  BP: (!) 163/90 (!) 165/95  Pulse: 75 74  Resp: 18 14  Temp: 98.8 F (37.1 C) 98.3 F (36.8 C)  SpO2: 97% 98%   1/19 HR controlled. DBP elevated--increase lopressor to 25mg  bid 10.  Acute on chronic diastolic congestive heart failure.  Monitor for any signs of fluid overload.  Lasix adjusted 40 mg Monday Wednesday Friday due to AKI   [facility] Weights   04/23/21 0600 04/24/21 0700 04/25/21 0500  Weight: 93 kg 93.5 kg 90.7 kg    Weights stable to decreased 11.  AKI/CKD stage III.  I personally reviewed the patient's labs today.  Stable numbers 12.  Obesity.  BMI 37.91.  Dietary follow-up 13.  Breast asymmetry L>R with tenderness, ? Mastitis vs mass, contacted Gen Surgery who did eval pt - no infx suspect, rec yearly mammo as an OP. Rec a supportive bra,  Sports bra can be brought in per family , K-tape                LOS: 10 days A FACE TO FACE EVALUATION WAS PERFORMED  Meredith Staggers 04/25/2021, 11:03 AM

## 2021-04-25 NOTE — Progress Notes (Signed)
Patient ID: Dorothy Harris, female   DOB: 31-Dec-1960, 61 y.o.   MRN: [id number] Met with pt to discuss transportation and had her sign a ride wavier. Have emailed this into the transportation office. Explained to pt what she needed to do to get a ride, call 24 hour prior to appointment to set up. She asked worker to call her sister Colletta [place] to give information and answer her questions. Pt is aware of discharge tomorrow and needing to learn dressing changes for abdominal wound. Wound vac to be removed tomorrow. Will await sister's return call. Left a message for sister.

## 2021-04-25 NOTE — Progress Notes (Signed)
Physical Therapy Discharge Summary  Patient Details  Name: Dorothy Harris MRN: [id number] Date of Birth: 09-23-1960   Patient has met 10 of 10 long term goals due to improved activity tolerance, improved balance, improved postural control, increased strength, decreased pain, ability to compensate for deficits, and improved coordination.  Patient to discharge at an ambulatory level Modified Independent w/RW.   Patient's care partner is independent to provide the necessary physical assistance at discharge. Pt's sister and aunt available to assist pt at home.    Recommendation:  Patient will benefit from ongoing skilled PT services in home health setting to continue to advance safe functional mobility, address ongoing impairments in balance, decreased activity tolerance, and minimize fall risk.  Equipment: RW  Reasons for discharge: treatment goals met and discharge from hospital  Patient/family agrees with progress made and goals achieved: Yes  PT Discharge Precautions/Restrictions Precautions Precautions: Fall;Sternal Precaution Booklet Issued: Yes (comment) Precaution Comments: monitor O2, HR; abdominal wound vac, colostomy Restrictions Weight Bearing Restrictions: No Other Position/Activity Restrictions: sternal precautions Pain Interference Pain Interference Pain Effect on Sleep: 2. Occasionally Pain Interference with Therapy Activities: 2. Occasionally Pain Interference with Day-to-Day Activities: 1. Rarely or not at all Vision/Perception  Vision - History Ability to See in Adequate Light: 0 Adequate Perception Perception: Within Functional Limits Praxis Praxis: Intact  Cognition Overall Cognitive Status: Within Functional Limits for tasks assessed Arousal/Alertness: Awake/alert Orientation Level: Oriented X4 Safety/Judgment: Appears intact Sensation Sensation Light Touch: Appears Intact Hot/Cold: Appears Intact Proprioception: Appears Intact Stereognosis: Appears  Intact Coordination Gross Motor Movements are Fluid and Coordinated: Yes Fine Motor Movements are Fluid and Coordinated: Yes Coordination and Movement Description: generalized weakness/deconditioning and poor adherance to sternal precautions Finger Nose Finger Test: [facility] but slow Heel Shin Test: [facility] but slow Motor  Motor Motor: Abnormal postural alignment and control Motor - Discharge Observations: limited by sternal precautions, abdominal wound -limited in full trunk flexion  Mobility Bed Mobility Bed Mobility: Rolling Right;Rolling Left;Supine to Sit;Sit to Supine Rolling Right: Independent Rolling Left: Independent Supine to Sit: Independent Sit to Supine: Independent Transfers Transfers: Sit to Stand;Stand to Sit;Stand Pivot Transfers Sit to Stand: Independent with assistive device Stand to Sit: Independent with assistive device Stand Pivot Transfers: Independent with assistive device Transfer (Assistive device): Rolling walker Locomotion  Gait Ambulation: Yes Gait Assistance: Supervision/Verbal cueing Gait Distance (Feet): 150 Feet Assistive device: Rolling walker Gait Assistance Details: Verbal cues for safe use of DME/AE Gait Gait: Yes Gait Pattern: Impaired Gait Pattern: Trunk flexed;Decreased step length - left;Decreased step length - right;Decreased stride length Stairs / Additional Locomotion Stairs: Yes Stair Management Technique: Two rails Number of Stairs: 12 Height of Stairs: 6 Ramp: Supervision/Verbal cueing Wheelchair Mobility Wheelchair Mobility: No  Trunk/Postural Assessment  Cervical Assessment Cervical Assessment: Within Functional Limits Thoracic Assessment Thoracic Assessment: Exceptions to [facility] (Rounded shoulders) Lumbar Assessment Lumbar Assessment: Exceptions to [facility] (Posterior pelvic tilt) Postural Control Postural Control: Within Functional Limits  Balance Balance Balance Assessed: Yes Static Sitting Balance Static Sitting - Balance  Support: Feet supported;No upper extremity supported Static Sitting - Level of Assistance: 7: Independent Dynamic Sitting Balance Dynamic Sitting - Balance Support: Feet supported;No upper extremity supported Dynamic Sitting - Level of Assistance: 7: Independent Static Standing Balance Static Standing - Balance Support: Bilateral upper extremity supported;During functional activity Static Standing - Level of Assistance: 6: Modified independent (Device/Increase time) Dynamic Standing Balance Dynamic Standing - Balance Support: Bilateral upper extremity supported;During functional activity Dynamic Standing - Level of Assistance: 6: Modified independent (  Device/Increase time) Extremity Assessment  RLE Assessment RLE Assessment: Exceptions to [facility] RLE Strength Right Hip Flexion: 4/5 Right Hip ABduction: 4/5 Right Hip ADduction: 4/5 Right Knee Flexion: 4/5 Right Knee Extension: 4/5 Right Ankle Dorsiflexion: 4/5 Right Ankle Plantar Flexion: 4-/5 LLE Assessment LLE Assessment: Exceptions to [facility] LLE Strength Left Hip Flexion: 4/5 Left Hip ABduction: 4/5 Left Hip ADduction: 4/5 Left Knee Flexion: 4/5 Left Knee Extension: 4/5 Left Ankle Dorsiflexion: 4/5 Left Ankle Plantar Flexion: 4-/5   Dorothy Harris E Dorothy Harris, PT, DPT 04/25/2021, 7:51 AM

## 2021-04-25 NOTE — Progress Notes (Addendum)
Inpatient Rehabilitation Care Coordinator Discharge Note   Patient Details  Name: Dorothy Harris MRN: [id number] Date of Birth: 1961/03/10   Discharge location: HOME [facility] INTERMITTENT ASSIST FROM [facility]  Length of Stay: 16 DAYS  Discharge activity level: MOD/I LEVEL  Home/community participation: ACTIVE  Patient response RQ:SXQKSK Literacy - How often do you need to have someone help you when you read instructions, pamphlets, or other written material from your doctor or pharmacy?: Sometimes  Patient response SH:NGITJL Isolation - How often do you feel lonely or isolated from those around you?: Rarely  Services provided included: MD, RD, PT, OT, SLP, RN, CM, Pharmacy, SW  Financial Services:  Financial Services Utilized: [place] offered to/list presented to: PT  Follow-up services arranged:  Home Health, DME, Patient/Family has no preference for HH/DME agencies [place]: [place] VISIT SO START Monday WILL GIVE EXTRA SUPPLIES FOR THE WEEKEND.   DME : [facility] AND 3 IN 1  PCP-SET UP VIA [facility] WILL CONTACT HER TO SET UP APPOINTMENT [place] [employer] SET UP FOR FOLLOW UP APPOINTMENTS-AWARE TO CALL 24 HR PRIOR TO APPOINTMENT TIME. NEED TO APPLY FOR SSD AND MEDICAID    Patient response to transportation need: Is the patient able to respond to transportation needs?: Yes In the past 12 months, has lack of transportation kept you from medical appointments or from getting medications?: No In the past 12 months, has lack of transportation kept you from meetings, work, or from getting things needed for daily living?: No    Comments (or additional information): PT [place] MET GOALS QUICKLY. MAIN ISSUE IS HER WOUND AND OSTOMY CARE. EDUCATED ON BOTH OF THESE AND WILL HAVE HHRN TO REINFORCE EDUCATION  Patient/Family verbalized understanding of follow-up  arrangements:  Yes  Individual responsible for coordination of the follow-up plan: STEPHANIE-SISTER [phone]  Confirmed correct DME delivered: Elease Hashimoto 04/25/2021    Carmelita Amparo, [place]

## 2021-04-25 NOTE — Progress Notes (Signed)
Patient ID: Dorothy Harris, female   DOB: 10-14-1960, 61 y.o.   MRN: [id number]  Spoke with Stephanie-sister via telephone and answered her questions regarding home health, equipment, medications and wound care. She is very pleased how well pt has done and had no further questions. She wants TOC to fill pt's prescriptions prior to going home. Discussed referral made for transportation for her follow up appointments. Feels prepared for discharge tomorrow.

## 2021-04-26 ENCOUNTER — Other Ambulatory Visit (HOSPITAL_COMMUNITY): Payer: Self-pay

## 2021-04-26 DIAGNOSIS — N1832 Chronic kidney disease, stage 3b: Secondary | ICD-10-CM

## 2021-04-26 DIAGNOSIS — T8130XS Disruption of wound, unspecified, sequela: Secondary | ICD-10-CM

## 2021-04-26 MED ORDER — METOPROLOL TARTRATE 25 MG PO TABS
25.0000 mg | ORAL_TABLET | Freq: Two times a day (BID) | ORAL | 0 refills | Status: DC
  Filled 2021-04-26: qty 60, 30d supply, fill #0

## 2021-04-26 NOTE — Progress Notes (Signed)
INPATIENT REHABILITATION DISCHARGE NOTE   Discharge instructions by: Linna Hoff , PA  Verbalized understanding: expressed   Skin care/Wound care healing?  Moist to dry education provided by WOCN   Pain: 4/10  IV's: removed  Tubes/Drains: removed  O2: room air   Safety instructions: given by PA  Patient belongings: sent with family   Discharged to: home  Discharged via: Wheelchair  Notes:

## 2021-04-26 NOTE — Progress Notes (Signed)
PROGRESS NOTE   Subjective/Complaints:  Pt without new complaints. Excited but a little anxious about dc. Awaiting WOC RN to remove vac and provide education while I was in this morning  ROS: Patient denies fever, rash, sore throat, blurred vision, nausea, vomiting, diarrhea, cough, shortness of breath or chest pain,   headache, or mood change.    Objective:   No results found. No results for input(s): WBC, HGB, HCT, PLT in the last 72 hours.   No results for input(s): NA, K, CL, CO2, GLUCOSE, BUN, CREATININE, CALCIUM in the last 72 hours.    Intake/Output Summary (Last 24 hours) at 04/26/2021 1044 Last data filed at 04/26/2021 0500 Gross per 24 hour  Intake 240 ml  Output 676 ml  Net -436 ml        Physical Exam: Vital Signs Blood pressure (!) 158/103, pulse 75, temperature 98.2 F (36.8 C), resp. rate 14, height $RemoveBe'5\' 7"'RlSnxWfRa$  (1.702 m), weight 90.7 kg, SpO2 97 %.   Constitutional: No distress . Vital signs reviewed. HEENT: NCAT, EOMI, oral membranes moist Neck: supple Cardiovascular: RRR without murmur. No JVD    Respiratory/Chest: CTA Bilaterally without wheezes or rales. Normal effort    GI/Abdomen: BS +, non-tender, non-distended Ext: no clubbing, cyanosis, or edema Psych: pleasant and cooperative  Skin: ostomy sealed. Vac closed as well.  Neurologic: Cranial nerves II through XII intact, motor strength is 4/5 in bilateral deltoid, bicep, tricep, grip, hip flexor, knee extensors, ankle dorsiflexor and plantar flexor.  Musculoskeletal: Full range of motion in all 4 extremities. No joint swelling Breasts- left breast enlarged vs Right--no change  Assessment/Plan: 1. Functional deficits which require 3+ hours per day of interdisciplinary therapy in a comprehensive inpatient rehab setting. Physiatrist is providing close team supervision and 24 hour management of active medical problems listed below. Physiatrist and  rehab team continue to assess barriers to discharge/monitor patient progress toward functional and medical goals  Care Tool:  Bathing    Body parts bathed by patient: Face, Right arm, Left arm, Chest, Front perineal area, Buttocks, Right upper leg, Left upper leg, Right lower leg, Left lower leg     Body parts n/a: Abdomen   Bathing assist Assist Level: Independent with assistive device     Upper Body Dressing/Undressing Upper body dressing   What is the patient wearing?: Pull over shirt    Upper body assist Assist Level: Independent    Lower Body Dressing/Undressing Lower body dressing    Lower body dressing activity did not occur: N/A What is the patient wearing?: Underwear/pull up, Pants     Lower body assist Assist for lower body dressing: Independent with assitive device     Toileting Toileting Toileting Activity did not occur (Clothing management and hygiene only): N/A (no void or bm)  Toileting assist Assist for toileting: Independent with assistive device     Transfers Chair/bed transfer  Transfers assist     Chair/bed transfer assist level: Independent with assistive device Chair/bed transfer assistive device: Museum/gallery exhibitions officer assist      Assist level: Supervision/Verbal cueing Assistive device: Walker-rolling Max distance: 117ft   Walk 10 feet activity  Assist     Assist level: Supervision/Verbal cueing Assistive device: Walker-rolling   Walk 50 feet activity   Assist Walk 50 feet with 2 turns activity did not occur: Safety/medical concerns  Assist level: Supervision/Verbal cueing Assistive device: Walker-rolling    Walk 150 feet activity   Assist Walk 150 feet activity did not occur: Safety/medical concerns  Assist level: Supervision/Verbal cueing Assistive device: Walker-rolling    Walk 10 feet on uneven surface  activity   Assist Walk 10 feet on uneven surfaces activity did not occur:  Safety/medical concerns   Assist level: Supervision/Verbal cueing Assistive device: Walker-rolling   Wheelchair     Assist Is the patient using a wheelchair?: No Type of Wheelchair: Manual Wheelchair activity did not occur: Safety/medical concerns  Wheelchair assist level: Dependent - Patient 0% Max wheelchair distance: 133ft    Wheelchair 50 feet with 2 turns activity    Assist    Wheelchair 50 feet with 2 turns activity did not occur: Safety/medical concerns   Assist Level: Dependent - Patient 0%   Wheelchair 150 feet activity     Assist  Wheelchair 150 feet activity did not occur: Safety/medical concerns   Assist Level: Dependent - Patient 0%   Blood pressure (!) 158/103, pulse 75, temperature 98.2 F (36.8 C), resp. rate 14, height $RemoveBe'5\' 7"'epizcgDaC$  (1.702 m), weight 90.7 kg, SpO2 97 %.  Medical Problem List and Plan: 1. 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  Routine ostomy care as well as wound VAC changes             dc home today. F/u with surgery, pcp. Doesn't need to see [facility]  -[facility] PT and RN 2.  Antithrombotics: -DVT/anticoagulation:  Pharmaceutical: Heparin             -antiplatelet therapy: Aspirin 324 mg daily 3. Pain Management: Robaxin and oxycodone as needed 4. Mood: Provide emotional support             -antipsychotic agents: N/A 5. Neuropsych: This patient is capable of making decisions on her own behalf. 6. Skin/Wound Care: continue VAC changes MWF.   -pt doint a lot of ostomy care 7. Fluids/Electrolytes/Nutrition:   8.  Acute blood loss anemia.  Multiple transfusions/FFP.  Follow-up CBC stable 7.9  -1/17 hgb up to 8.4 9.  A. fib/RVR 04/06/2021.  Continue  amiodarone 200 mg daily/Lopressor 12.5 mg twice daily.  Cardiac rate controlled.  Not an anticoagulation candidate at this time. Vitals:   04/25/21 1944 04/26/21 0525  BP: (!) 160/103 (!) 158/103  Pulse: 78 75  Resp: 20 14  Temp: 98.1 F (36.7 C) 98.2 F (36.8 C)  SpO2: 97% 97%   1/20 HR controlled. DBP elevated--increased lopressor to $RemoveBefo'25mg'fYdboWHBMJH$  bid without change. Will need further follow up and adjustment once home. Would like to recheck bp at home where she's in a comfortable/relaxed state.  10.  Acute on chronic diastolic congestive heart failure.  Monitor for any signs of fluid overload.  Lasix adjusted 40 mg Monday Wednesday Friday due to AKI   [facility] Weights   04/23/21 0600 04/24/21 0700 04/25/21 0500  Weight: 93 kg 93.5 kg 90.7 kg    Weights stable to decreased 11.  AKI/CKD stage IIIB     stable 12.  Obesity.  BMI  37.91.  Dietary follow-up 13.  Breast asymmetry L>R with tenderness, ? Mastitis vs mass, contacted [facility] Surgery who did eval pt - no infx suspect, rec yearly mammo as an OP. Rec a supportive bra,  Sports bra can be brought in per family , K-tape  -needs f/u at breast clinic for mammogram                LOS: 11 days A FACE TO FACE EVALUATION WAS PERFORMED  Meredith Staggers 04/26/2021, 10:44 AM

## 2021-04-26 NOTE — Plan of Care (Signed)
Problem: RH Stairs °Goal: LTG Patient will ambulate up and down stairs w/assist (PT) °Description: LTG: Patient will ambulate up and down # of stairs with assistance (PT) °Outcome: Completed/Met °  °

## 2021-04-26 NOTE — Consult Note (Signed)
[facility] Nurse wound follow up Patient to discharge today. NPWT discontinued and changed to W/D dressing.  Abdominal wound vac discontinued, removed and W/D dressing placed. Instructed patient how to moisten the Kerlix and fluff into the wound, cover with an ABD pad and secure with Medipore tape. Patient expressed understanding and stated she would be able to change the dressing twice a day. Encouraged to keep follow-up appointment with the surgical team and with the ostomy clinic. Reminded to change her ostomy pouch twice a week. Pouch secure in place with no leakage at this time, therefore not changed today. Previously signed up for the [employer] program. Her kit should have been delivered at her home and will have instructions of how to obtain supplies.   WOC will sign off at this time but are available if needed.  Cathlean Marseilles Tamala Julian, MSN, RN, [place], Lysle Pearl, [facility] Wound Treatment Associate Pager [phone]

## 2021-04-30 ENCOUNTER — Other Ambulatory Visit: Payer: Self-pay

## 2021-04-30 ENCOUNTER — Encounter (HOSPITAL_COMMUNITY)
Admission: RE | Admit: 2021-04-30 | Discharge: 2021-04-30 | Disposition: A | Discharge status: Home / Self Care | Payer: PRIVATE HEALTH INSURANCE | Source: Ambulatory Visit | Attending: Nurse Practitioner | Admitting: Nurse Practitioner

## 2021-04-30 ENCOUNTER — Other Ambulatory Visit (HOSPITAL_COMMUNITY): Payer: Self-pay

## 2021-04-30 DIAGNOSIS — Z01812 Encounter for preprocedural laboratory examination: Secondary | ICD-10-CM | POA: Insufficient documentation

## 2021-04-30 DIAGNOSIS — L24B1 Irritant contact dermatitis related to digestive stoma or fistula: Secondary | ICD-10-CM | POA: Diagnosis not present

## 2021-04-30 DIAGNOSIS — Z932 Ileostomy status: Secondary | ICD-10-CM | POA: Diagnosis not present

## 2021-04-30 DIAGNOSIS — R5381 Other malaise: Secondary | ICD-10-CM | POA: Diagnosis not present

## 2021-04-30 DIAGNOSIS — Z432 Encounter for attention to ileostomy: Secondary | ICD-10-CM | POA: Diagnosis not present

## 2021-04-30 NOTE — Progress Notes (Signed)
[facility]   Reason for visit:  RLQ ileostomy  contact dermatitis to lower half of peristomal area, os points down towards 7 o'clock and is flush at this level.  HPI:  Exploratory laparotomy with lysis of adhesions, ventral hernia repair and creation of ileostomy ROS  Review of Systems  Cardiovascular:        Hypertension, chronic CHF Appointment with cardiology 05/07/21  Gastrointestinal:        RMQ ileostomy  Skin:        Contact dermatitis to peristomal skin  All other systems reviewed and are negative. Vital signs:  BP (!) 177/111 (BP Location: Right Leg)    Pulse 95    Temp 98.1 F (36.7 C) (Oral)    Resp 17    Ht $R'5\' 7"'fo$  (1.702 m)    SpO2 99%    BMI 31.32 kg/m  Exam:  Physical Exam Constitutional:      Appearance: Normal appearance.  Abdominal:     Palpations: Abdomen is soft.  Neurological:     Mental Status: She is alert.  Psychiatric:        Mood and Affect: Mood normal.        Behavior: Behavior normal.    Stoma type/location:  RMQ ileostomy Stomal assessment/size:  pink and moist  no stool in pouch, has not eaten today. Peristomal assessment:  denuded skin from 4 to 7 o'clock, partial thickness tissue loss. Os points down to this area and stool is leaking at this time.  Treatment options for stomal/peristomal skin: Stoma powder and skin prep to dermatitis, barrier ring and 2 piece pouch. Adding ostomy belt for extra security. Output: none today Encouraged drinking adequate fluids, has no dry mouth,  BP is elevated today.  Rechecked at end of visit:  166/108  Noted BP was elevated throughout hospitalization.   Has medication and reports she is taking as prescribed.  She has upcoming appointments with new PCP and cardiology.  She feels fine. She denies headache, blurry vision or increased swelling.  Ostomy pouching: 2pc. 2 3/4" pouch with barrier ring. Added belt.    Education provided:  We applied belt today. Adjusted to fit.     Impression/dx   Contact dermatitis  Discussion  See back in one month Added belt and barrier ring Plan  Back in one month Set up with [facility]    Visit time: 60 minutes.   Domenic Moras FNP-BC

## 2021-04-30 NOTE — Discharge Instructions (Signed)
Stoma size 1 1/4"  Product item numbers filtered pouch  item # [id number] Barrier  item number: [id number] Barrier ring  item # [id number] Powder item #  [phone] Belt item [phone] No sting wipes item [phone]

## 2021-05-07 ENCOUNTER — Encounter: Payer: Self-pay | Admitting: Physician Assistant

## 2021-05-07 ENCOUNTER — Ambulatory Visit (INDEPENDENT_AMBULATORY_CARE_PROVIDER_SITE_OTHER): Payer: PRIVATE HEALTH INSURANCE | Admitting: Cardiovascular Disease

## 2021-05-07 ENCOUNTER — Ambulatory Visit (INDEPENDENT_AMBULATORY_CARE_PROVIDER_SITE_OTHER): Payer: Self-pay | Admitting: Physician Assistant

## 2021-05-07 ENCOUNTER — Ambulatory Visit
Admission: RE | Admit: 2021-05-07 | Discharge: 2021-05-07 | Disposition: A | Discharge status: Home / Self Care | Payer: PRIVATE HEALTH INSURANCE | Source: Ambulatory Visit | Attending: Surgery | Admitting: Surgery

## 2021-05-07 ENCOUNTER — Other Ambulatory Visit: Payer: Self-pay

## 2021-05-07 ENCOUNTER — Encounter (HOSPITAL_BASED_OUTPATIENT_CLINIC_OR_DEPARTMENT_OTHER): Payer: Self-pay | Admitting: Cardiovascular Disease

## 2021-05-07 VITALS — BP 155/106 | HR 84 | Resp 20 | Ht 67.0 in | Wt 191.8 lb

## 2021-05-07 VITALS — BP 164/96 | HR 68 | Ht 67.0 in | Wt 191.5 lb

## 2021-05-07 DIAGNOSIS — I10 Essential (primary) hypertension: Secondary | ICD-10-CM

## 2021-05-07 DIAGNOSIS — Z9889 Other specified postprocedural states: Secondary | ICD-10-CM | POA: Diagnosis not present

## 2021-05-07 DIAGNOSIS — I480 Paroxysmal atrial fibrillation: Secondary | ICD-10-CM | POA: Diagnosis not present

## 2021-05-07 DIAGNOSIS — Z95828 Presence of other vascular implants and grafts: Secondary | ICD-10-CM | POA: Diagnosis not present

## 2021-05-07 DIAGNOSIS — Z8679 Personal history of other diseases of the circulatory system: Secondary | ICD-10-CM | POA: Diagnosis not present

## 2021-05-07 DIAGNOSIS — E7800 Pure hypercholesterolemia, unspecified: Secondary | ICD-10-CM

## 2021-05-07 HISTORY — DX: Paroxysmal atrial fibrillation: I48.0

## 2021-05-07 HISTORY — DX: Pure hypercholesterolemia, unspecified: E78.00

## 2021-05-07 HISTORY — DX: Essential (primary) hypertension: I10

## 2021-05-07 MED ORDER — CHLORTHALIDONE 25 MG PO TABS: 25.0000 mg | ORAL_TABLET | Freq: Every day | ORAL | 3 refills | Status: DC

## 2021-05-07 MED ORDER — BLOOD PRESSURE KIT: PACK | 0 refills | Status: DC

## 2021-05-07 MED ORDER — AMLODIPINE BESYLATE 5 MG PO TABS: 5.0000 mg | ORAL_TABLET | Freq: Every day | ORAL | 3 refills | Status: DC

## 2021-05-07 NOTE — Assessment & Plan Note (Addendum)
Blood pressure is poorly controlled.  Her goal is less than 130/80.  She has a history of aortic dissection both type a and type B.  Therefore she needs to be on a beta-blocker indefinitely.  Continue metoprolol and we will add chlorthalidone 25 mg daily and amlodipine 5 mg daily.  She will get lipids and a CMP in a week.  She had an abdominal CTA 03/2021 that revealed minimal, nonobstructive plaque in bilateral renal arteries.  She will track her blood pressures twice daily and bring to follow-up.  We will give her prescription for a blood pressure cuff.  We will also give her an advance hypertension handbook with educational information and places to record her blood pressures in the back.

## 2021-05-07 NOTE — Assessment & Plan Note (Signed)
She was congratulated on smoking cessation since her hospitalization 03/2021.

## 2021-05-07 NOTE — Patient Instructions (Addendum)
Medication Instructions:  START CHLORTHALIDONE 25 MG DAILY   START AMLODIPINE 5 MG DAILY    Labwork: LP/CMET/TSH IN 1 WEEK AT [facility] FROM [place]    Testing/Procedures: NONE   Follow-Up: 06/05/2021 9:30 am WITH PHARM D AT [facility] OFFICE   Referrals:  CARDIAC REHAB AT [place] IF YOU DO NOT HEAR FROM THEM IN 2 WEEKS CALL THE OFFICE TO FOLLOW UP    Special Instructions:  MONITOR YOUR BLOOD PRESSURE TWICE A DAY, LOG IN THE BOOK PROVIDED. BRING THE BOOK AND YOUR BLOOD PRESSURE MACHINE TO YOUR FOLLOW UP IN 1 MONTH   DASH Eating Plan DASH stands for "Dietary Approaches to Stop Hypertension." The DASH eating plan is a healthy eating plan that has been shown to reduce high blood pressure (hypertension). It may also reduce your risk for type 2 diabetes, heart disease, and stroke. The DASH eating plan may also help with weight loss. What are tips for following this plan?  General guidelines Avoid eating more than 2,300 mg (milligrams) of salt (sodium) a day. If you have hypertension, you may need to reduce your sodium intake to 1,500 mg a day. Limit alcohol intake to no more than 1 drink a day for nonpregnant women and 2 drinks a day for men. One drink equals 12 oz of beer, 5 oz of wine, or 1 oz of hard liquor. Work with your health care provider to maintain a healthy body weight or to lose weight. Ask what an ideal weight is for you. Get at least 30 minutes of exercise that causes your heart to beat faster (aerobic exercise) most days of the week. Activities may include walking, swimming, or biking. Work with your health care provider or diet and nutrition specialist (dietitian) to adjust your eating plan to your individual calorie needs. Reading food labels  Check food labels for the amount of sodium per serving. Choose foods with less than 5 percent of the Daily Value of sodium. Generally, foods with less than 300 mg of sodium per serving fit into this eating plan. To find  whole grains, look for the word "whole" as the first word in the ingredient list. Shopping Buy products labeled as "low-sodium" or "no salt added." Buy fresh foods. Avoid canned foods and premade or frozen meals. Cooking Avoid adding salt when cooking. Use salt-free seasonings or herbs instead of table salt or sea salt. Check with your health care provider or pharmacist before using salt substitutes. Do not fry foods. Cook foods using healthy methods such as baking, boiling, grilling, and broiling instead. Cook with heart-healthy oils, such as olive, canola, soybean, or sunflower oil. Meal planning Eat a balanced diet that includes: 5 or more servings of fruits and vegetables each day. At each meal, try to fill half of your plate with fruits and vegetables. Up to 6-8 servings of whole grains each day. Less than 6 oz of lean meat, poultry, or fish each day. A 3-oz serving of meat is about the same size as a deck of cards. One egg equals 1 oz. 2 servings of low-fat dairy each day. A serving of nuts, seeds, or beans 5 times each week. Heart-healthy fats. Healthy fats called Omega-3 fatty acids are found in foods such as flaxseeds and coldwater fish, like sardines, salmon, and mackerel. Limit how much you eat of the following: Canned or prepackaged foods. Food that is high in trans fat, such as fried foods. Food that is high in saturated fat, such as fatty meat.  Sweets, desserts, sugary drinks, and other foods with added sugar. Full-fat dairy products. Do not salt foods before eating. Try to eat at least 2 vegetarian meals each week. Eat more home-cooked food and less restaurant, buffet, and fast food. When eating at a restaurant, ask that your food be prepared with less salt or no salt, if possible. What foods are recommended? The items listed may not be a complete list. Talk with your dietitian about what dietary choices are best for you. Grains Whole-grain or whole-wheat bread.  Whole-grain or whole-wheat pasta. Brown rice. [place]. Bulgur. Whole-grain and low-sodium cereals. Pita bread. Low-fat, low-sodium crackers. Whole-wheat flour tortillas. Vegetables Fresh or frozen vegetables (raw, steamed, roasted, or grilled). Low-sodium or reduced-sodium tomato and vegetable juice. Low-sodium or reduced-sodium tomato sauce and tomato paste. Low-sodium or reduced-sodium canned vegetables. Fruits All fresh, dried, or frozen fruit. Canned fruit in natural juice (without added sugar). Meat and other protein foods Skinless chicken or [place]. Ground chicken or [place]. Pork with fat trimmed off. Fish and seafood. Egg whites. Dried beans, peas, or lentils. Unsalted nuts, nut butters, and seeds. Unsalted canned beans. Lean cuts of beef with fat trimmed off. Low-sodium, lean deli meat. Dairy Low-fat (1%) or fat-free (skim) milk. Fat-free, low-fat, or reduced-fat cheeses. Nonfat, low-sodium ricotta or cottage cheese. Low-fat or nonfat yogurt. Low-fat, low-sodium cheese. Fats and oils Soft margarine without trans fats. Vegetable oil. Low-fat, reduced-fat, or light mayonnaise and salad dressings (reduced-sodium). Canola, safflower, olive, soybean, and sunflower oils. Avocado. Seasoning and other foods Herbs. Spices. Seasoning mixes without salt. Unsalted popcorn and pretzels. Fat-free sweets. What foods are not recommended? The items listed may not be a complete list. Talk with your dietitian about what dietary choices are best for you. Grains Baked goods made with fat, such as croissants, muffins, or some breads. Dry pasta or rice meal packs. Vegetables Creamed or fried vegetables. Vegetables in a cheese sauce. Regular canned vegetables (not low-sodium or reduced-sodium). Regular canned tomato sauce and paste (not low-sodium or reduced-sodium). Regular tomato and vegetable juice (not low-sodium or reduced-sodium). Angie Fava. Olives. Fruits Canned fruit in a light or heavy syrup.  Fried fruit. Fruit in cream or butter sauce. Meat and other protein foods Fatty cuts of meat. Ribs. Fried meat. Berniece Salines. Sausage. Bologna and other processed lunch meats. Salami. Fatback. Hotdogs. Bratwurst. Salted nuts and seeds. Canned beans with added salt. Canned or smoked fish. Whole eggs or egg yolks. Chicken or [place] with skin. Dairy Whole or 2% milk, cream, and half-and-half. Whole or full-fat cream cheese. Whole-fat or sweetened yogurt. Full-fat cheese. Nondairy creamers. Whipped toppings. Processed cheese and cheese spreads. Fats and oils Butter. Stick margarine. Lard. Shortening. Ghee. Bacon fat. Tropical oils, such as coconut, palm kernel, or palm oil. Seasoning and other foods Salted popcorn and pretzels. Onion salt, garlic salt, seasoned salt, table salt, and sea salt. Worcestershire sauce. Tartar sauce. Barbecue sauce. Teriyaki sauce. Soy sauce, including reduced-sodium. Steak sauce. Canned and packaged gravies. Fish sauce. Oyster sauce. Cocktail sauce. Horseradish that you find on the shelf. Ketchup. Mustard. Meat flavorings and tenderizers. Bouillon cubes. Hot sauce and Tabasco sauce. Premade or packaged marinades. Premade or packaged taco seasonings. Relishes. Regular salad dressings. Where to find more information: National Heart, Lung, and [place]: https://wilson-eaton.com/ American Heart Association: www.heart.org Summary The DASH eating plan is a healthy eating plan that has been shown to reduce high blood pressure (hypertension). It may also reduce your risk for type 2 diabetes, heart disease, and stroke. With the DASH eating  plan, you should limit salt (sodium) intake to 2,300 mg a day. If you have hypertension, you may need to reduce your sodium intake to 1,500 mg a day. When on the DASH eating plan, aim to eat more fresh fruits and vegetables, whole grains, lean proteins, low-fat dairy, and heart-healthy fats. Work with your health care provider or diet and nutrition  specialist (dietitian) to adjust your eating plan to your individual calorie needs. This information is not intended to replace advice given to you by your health care provider. Make sure you discuss any questions you have with your health care provider. Document Released: 03/13/2011 Document Revised: 03/06/2017 Document Reviewed: 03/17/2016 Elsevier Patient Education  2020 [employer].

## 2021-05-07 NOTE — Assessment & Plan Note (Signed)
Dorothy Harris had postoperative atrial fibrillation.  We will plan to continue her amiodarone through March 2023 and then we will to stop it.  Continue metoprolol.  She does not that she is having recurrent episodes since discharge.

## 2021-05-07 NOTE — Progress Notes (Signed)
[address]       [place] [address]             [phone]       HPI:  [name] is a 61 year old female with history of coronary artery disease, hypertension, stage III CKD, obesity, diabetes.  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  6 weeks ago, she presented to [facility] with chest pain and was found to have a type a dissection with contained rupture that extended into the arch vessels and into the previous type B dissection. Patient returns for routine postoperative follow-up having undergone replacement of the aortic arch under deep hypothermic circulatory arrest with bypass of the innominate, left common carotid, and left subclavian arteries utilizing a Hemashield platinum graft by Dr. Lavinia Sharps on 03/24/2021.  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   The patient's early postoperative recovery while in the hospital was notable for acute on chronic renal insufficiency, atrial fibrillation with ultimate conversion back to sinus rhythm, and bowel obstruction resulting from a strangulated incisional hernia with perforation of the proximal transverse colon perforated cecum, and ischemia of the mid transverse colon.  On 04/02/2021, she underwent exploratory laparotomy by Dr. Bonita Quin.  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  The incisional hernia was also repaired.  She was eventually discharged to cardiac rehab by the primary care team and has been home for about 1 week.  She saw Chilton Si earlier today for scheduled postoperative follow-up. Since hospital discharge the patient reports she is continuing to progress.  She has no new complaints or concerns.  She denies chest pain or shortness of breath.   Current Outpatient Medications  Medication Sig Dispense Refill   acetaminophen (TYLENOL) 325 MG tablet Take 1-2 tablets (325-650 mg total) by mouth every 4 (four) hours as needed for mild pain.     amiodarone  (PACERONE) 200 MG tablet Take 1 tablet (200 mg total) by mouth daily. 30 tablet 0   amLODipine (NORVASC) 5 MG tablet Take 1 tablet (5 mg total) by mouth daily. 90 tablet 3   aspirin 81 MG chewable tablet Chew 4 tablets (324 mg total) by mouth daily.     atorvastatin (LIPITOR) 80 MG tablet Take 1 tablet (80 mg total) by mouth daily. 30 tablet 0   Blood Pressure KIT MONITOR YOUR BLOOD PRESSURE TWICE A DAY  DX I10 1 kit 0   chlorthalidone (HYGROTON) 25 MG tablet Take 1 tablet (25 mg total) by mouth daily. 90 tablet 3   ferrous sulfate 325 (65 FE) MG tablet Take 1 tablet (325 mg total) by mouth daily with breakfast. 30 tablet 0   methocarbamol (ROBAXIN) 500 MG tablet Take 1 tablet (500 mg total) by mouth every 6 (six) hours as needed for muscle spasms. 60 tablet 0   metoprolol tartrate (LOPRESSOR) 25 MG tablet Take 1 tablet (25 mg total) by mouth 2 (two) times daily. 60 tablet 0   Multiple Vitamin (MULTIVITAMIN WITH MINERALS) TABS tablet Take 1 tablet by mouth daily.     nitroGLYCERIN (NITROSTAT) 0.4 MG SL tablet Place 1 tablet (0.4 mg total) under the tongue every 5 (five) minutes as needed for chest pain. 25 tablet 12   oxyCODONE (OXY IR/ROXICODONE) 5 MG immediate release tablet Take 1-2 tablets (5-10 mg total) by mouth every 4 (four) hours as needed for moderate pain. 30 tablet 0   pantoprazole (PROTONIX) 40 MG tablet Take  1 tablet (40 mg total) by mouth daily. 30 tablet 0   No current facility-administered medications for this visit.    Physical Exam:  Vital signs BP 155/106 Pulse 84 Respirations 20 O2 sat 97% on room air  General: Ms. Durr is ambulating with a walker but was able to step up onto the exam table without any difficulty. Heart: Regular rate and rhythm, high-pitched murmur heard at the right upper sternal border. Chest: Breath sounds are clear to auscultation.  Sternotomy incision and the right infraclavicular incision are both healing with no sign of complication.  Sternum  is stable. Extremities: No peripheral edema, good perfusion throughout.  Diagnostic Tests: CLINICAL DATA:  Ascending aortic replacement   EXAM: CHEST - 2 VIEW   COMPARISON:  04/09/2021   FINDINGS: Mild bibasilar atelectasis. No focal consolidation, pleural effusion or pneumothorax. Dilated aortic arch. Stable heart size. Prior median sternotomy. No acute osseous abnormality.   IMPRESSION: No active cardiopulmonary disease.     Electronically Signed   By: Kathreen Devoid M.D.   On: 05/07/2021 14:44  Impression / Plan:  -Type a aortic dissection--Dorothy Harris is now about 6 weeks status post repair of a type a aortic dissection with replacement of the ascending aorta and aortic arch.  She is progressing well following extended hospitalization followed by a few weeks inpatient rehab.  She has lives independently and has friends who check on her daily basis.    -Hypertension-blood pressure has gradually increased since her hospitalization.  This is being addressed by Dr. Oval Linsey.  Metoprolol is currently 25 mg p.o. twice daily.  Chlorthalidone 25 mg p.o. daily and amlodipine 5 mg daily have been added as of today.  -Postoperative atrial fibrillation obtaining sinus rhythm.  Cardiology plans to continue with the amiodarone through March.  -Abdominal aortic aneurysm-most recent measurement was 4.6 cm on abdominal CTA while in the hospital.  This was increased from 3.9 cm in 2019.  This should  be followed by vascular surgery but will hold off on referral until her next follow-up.  -Disposition- [name] should continue to observe sternal precautions for another month and should avoid smoking.  Importance of careful blood pressure management was again discussed with her.  We will schedule for follow-up in our office in 1 month.    Antony Odea, PA-C [facility] [phone]

## 2021-05-07 NOTE — Patient Instructions (Signed)
Continue to observe sternal precautions for another month.  Target blood pressure is less than 130/80.  Avoid tobacco products.  Follow-up in 1 month
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
## 2021-05-07 NOTE — Assessment & Plan Note (Signed)
Ms. Yeldell had a type a aortic dissection 03/2021.  She is recovering well and is now back home from rehab.  She sees Dr. Cyndia Bent later today for follow-up.  We will defer to him as to when she can switch back to aspirin 81 mg daily.  We will refer her to cardiac rehab in [place] today.

## 2021-05-07 NOTE — Assessment & Plan Note (Signed)
She has atherosclerosis of the aorta and mesenteric arteries.  She also has nonobstructive coronary disease.  Her LDL goal is less than 70.  Continue atorvastatin and repeat lipids and CMP when she goes for labs next week.

## 2021-05-07 NOTE — Assessment & Plan Note (Signed)
Nonobstructive coronary disease.  Continue aspirin, metoprolol, and atorvastatin.  LDL goal is less than 70.

## 2021-05-13 ENCOUNTER — Other Ambulatory Visit (HOSPITAL_COMMUNITY): Payer: Self-pay | Admitting: Nurse Practitioner

## 2021-05-13 DIAGNOSIS — Z432 Encounter for attention to ileostomy: Secondary | ICD-10-CM

## 2021-05-23 LAB — COMPREHENSIVE METABOLIC PANEL
ALT: 14 IU/L (ref 0–32)
AST: 15 IU/L (ref 0–40)
Albumin/Globulin Ratio: 1.5 (ref 1.2–2.2)
Albumin: 4.7 g/dL (ref 3.8–4.9)
Alkaline Phosphatase: 149 IU/L — ABNORMAL HIGH (ref 44–121)
BUN/Creatinine Ratio: 15 (ref 12–28)
BUN: 52 mg/dL — ABNORMAL HIGH (ref 8–27)
Bilirubin Total: 0.4 mg/dL (ref 0.0–1.2)
CO2: 10 mmol/L — ABNORMAL LOW (ref 20–29)
Calcium: 9.6 mg/dL (ref 8.7–10.3)
Chloride: 110 mmol/L — ABNORMAL HIGH (ref 96–106)
Creatinine, Ser: 3.38 mg/dL — ABNORMAL HIGH (ref 0.57–1.00)
Globulin, Total: 3.2 g/dL (ref 1.5–4.5)
Glucose: 128 mg/dL — ABNORMAL HIGH (ref 70–99)
Potassium: 4.7 mmol/L (ref 3.5–5.2)
Sodium: 141 mmol/L (ref 134–144)
Total Protein: 7.9 g/dL (ref 6.0–8.5)
eGFR: 15 mL/min/{1.73_m2} — ABNORMAL LOW (ref >59)

## 2021-05-23 LAB — LIPID PANEL
Chol/HDL Ratio: 3.8 ratio (ref 0.0–4.4)
Cholesterol, Total: 144 mg/dL (ref 100–199)
HDL: 38 mg/dL — ABNORMAL LOW (ref >39)
LDL Chol Calc (NIH): 75 mg/dL (ref 0–99)
Triglycerides: 183 mg/dL — ABNORMAL HIGH (ref 0–149)
VLDL Cholesterol Cal: 31 mg/dL (ref 5–40)

## 2021-05-23 LAB — TSH: TSH: 1.74 u[IU]/mL (ref 0.450–4.500)

## 2021-05-27 ENCOUNTER — Other Ambulatory Visit (HOSPITAL_COMMUNITY): Payer: Self-pay

## 2021-05-27 ENCOUNTER — Telehealth: Payer: Self-pay | Admitting: Cardiovascular Disease

## 2021-05-27 DIAGNOSIS — I2510 Atherosclerotic heart disease of native coronary artery without angina pectoris: Secondary | ICD-10-CM

## 2021-05-27 DIAGNOSIS — I2583 Coronary atherosclerosis due to lipid rich plaque: Secondary | ICD-10-CM

## 2021-05-27 MED ORDER — PANTOPRAZOLE SODIUM 40 MG PO TBEC: 40.0000 mg | DELAYED_RELEASE_TABLET | Freq: Every day | ORAL | 3 refills | Status: DC

## 2021-05-27 MED ORDER — METOPROLOL TARTRATE 25 MG PO TABS: 25.0000 mg | ORAL_TABLET | Freq: Two times a day (BID) | ORAL | 2 refills | Status: DC

## 2021-05-27 MED ORDER — ATORVASTATIN CALCIUM 80 MG PO TABS: 80.0000 mg | ORAL_TABLET | Freq: Every day | ORAL | 2 refills | Status: DC

## 2021-05-27 MED ORDER — AMIODARONE HCL 200 MG PO TABS: 200.0000 mg | ORAL_TABLET | Freq: Every day | ORAL | 2 refills | Status: DC

## 2021-05-27 NOTE — Telephone Encounter (Signed)
Pt cardiac medication send into pt [employer].

## 2021-05-27 NOTE — Telephone Encounter (Signed)
°*  STAT* If patient is at the pharmacy, call can be transferred to refill team.   1. Which medications need to be refilled? (please list name of each medication and dose if known)  amiodarone (PACERONE) 200 MG tablet atorvastatin (LIPITOR) 80 MG tablet ferrous sulfate 325 (65 FE) MG tablet metoprolol tartrate (LOPRESSOR) 25 MG tablet oxyCODONE (OXY IR/ROXICODONE) 5 MG immediate release tablet pantoprazole (PROTONIX) 40 MG tablet   2. Which pharmacy/location (including street and city if local pharmacy) is medication to be sent to? [facility], [address]  3. Do they need a 30 day or 90 day supply? 90 day

## 2021-05-28 ENCOUNTER — Other Ambulatory Visit: Payer: Self-pay

## 2021-05-28 ENCOUNTER — Telehealth (HOSPITAL_BASED_OUTPATIENT_CLINIC_OR_DEPARTMENT_OTHER): Payer: Self-pay | Admitting: *Deleted

## 2021-05-28 ENCOUNTER — Ambulatory Visit (HOSPITAL_COMMUNITY)
Admission: RE | Admit: 2021-05-28 | Discharge: 2021-05-28 | Disposition: A | Discharge status: Home / Self Care | Payer: PRIVATE HEALTH INSURANCE | Source: Ambulatory Visit | Attending: Nurse Practitioner | Admitting: Nurse Practitioner

## 2021-05-28 DIAGNOSIS — I10 Essential (primary) hypertension: Secondary | ICD-10-CM

## 2021-05-28 DIAGNOSIS — Z432 Encounter for attention to ileostomy: Secondary | ICD-10-CM | POA: Diagnosis present

## 2021-05-28 DIAGNOSIS — N289 Disorder of kidney and ureter, unspecified: Secondary | ICD-10-CM

## 2021-05-28 MED ORDER — AMLODIPINE BESYLATE 10 MG PO TABS: 10.0000 mg | ORAL_TABLET | Freq: Every day | ORAL | 3 refills | Status: DC

## 2021-05-28 NOTE — Progress Notes (Signed)
[facility]   Reason for visit:  RLQ ileostomy, flush with contact dermatitis HPI:  Exploratory laparotomy with lysis of adhesions, ventral hernia repair and creation of ileostomy ROS  Review of Systems  Cardiovascular:        Has been placed on additional BP meds, VS WNL today.  NO shortness of breath. NO swelling noted.   Gastrointestinal:        RLQ ileostomy  Skin: Negative.   Psychiatric/Behavioral: Negative.    All other systems reviewed and are negative. Vital signs:  BP 108/68    Pulse 64    Temp 98.1 F (36.7 C) (Oral)    Resp 17    SpO2 100%  Exam:  Physical Exam Constitutional:      Appearance: Normal appearance. She is normal weight.  Cardiovascular:     Rate and Rhythm: Normal rate and regular rhythm.     Comments: BP lower today than last visit after PCP added additional meds Pulmonary:     Effort: Pulmonary effort is normal.     Breath sounds: Normal breath sounds.  Skin:    General: Skin is warm and dry.  Neurological:     Mental Status: She is alert and oriented to person, place, and time.  Psychiatric:        Mood and Affect: Mood normal.        Behavior: Behavior normal.    Stoma type/location:  RMQ ileostomy Stomal assessment/size:  1 1/4" pink and moist Peristomal assessment:  intact Treatment options for stomal/peristomal skin: barrier ring 2 piece filtered pouch  ostomy belt Output: soft brown stool Ostomy pouching: 2pc. 2 3/4" pouch with barrier ring  Education provided:  will call [facility] to finalize supply order.     Impression/dx  ileostomy Discussion  Call office for further needs.  Plan  Call as needed.     Visit time: 45 minutes.   Domenic Moras FNP-BC

## 2021-05-28 NOTE — Telephone Encounter (Signed)
-----   Message from Skeet Latch, MD sent at 05/26/2021  9:11 PM EST ----- Triglycerides are improving.  Kidney function is significantly worse.  Stop chlorthalidone, increase amlodipine to $RemoveBefor'10mg'eAWHCcpeSaOz$ .  BMP later this week.

## 2021-05-28 NOTE — Telephone Encounter (Signed)
Advised patient of lab results, medication changes, and lab recheck Called pharmacy to verify changes

## 2021-05-30 ENCOUNTER — Telehealth: Payer: Self-pay | Admitting: Cardiovascular Disease

## 2021-05-30 ENCOUNTER — Telehealth: Payer: Self-pay

## 2021-05-30 NOTE — Telephone Encounter (Signed)
Patient's sister, Colletta [place] contacted the office concerned about her sisters recent symptoms. She states that she is very weak, gets tired easily, and when she is short of breath, she starts to stutter. States that she needs a wheelchair when walking sometimes short distances and gets tired and lightheaded. She states that the patient has just been placed on 2 new additional blood pressure medications along with another she was originally on. Also states that her blood pressure has been normal while taking them.   Spoke with patient and advised that she should wait to hear back from her Cardiology office for further assistance as medication adjustments may need to be made. Advised to give the office a call back if she has not heard from them by tomorrow afternoon. She acknowledged receipt.

## 2021-05-30 NOTE — Telephone Encounter (Signed)
pt is not able to walk a long distance, has upset stomach, when pt is sob she is beginning to studder, heart surgery on 12/18, pt doesnt have an oxygen tank and may need one at home,pt is concerned with the amount of meds she is taking (whether or not pt symptoms are a side effect), pt gets tired and lightheaded when changing ostomy bag as well

## 2021-05-30 NOTE — Telephone Encounter (Signed)
Returned call to patient who was seen at [facility] today.  She states she was told her surgery site is healing well and she should let them know if she has issues with vomiting, which patient denied.  Pt states she has shortness of breath and studdering with exertion.  Pt denied shortness of breath during call, however studdering of speech was noted. Pt and Probation officer discussed recent surgeries and how it can take a while to build her strength back up after the extensive surgery she had.  Pt denied chest pain, or diaphoresis during periods of shortness of breath.  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 Explained to patient I would forward her questions and notes to Dr Oval Linsey and her nurse for review.  Pt agreed to this plan.  When asked pt confirmed she is taking her medications as prescribed.  She also denied need for transportation to Pharm D appt on 06/05/2021.  Will cancel appt with [facility] transportation.

## 2021-05-30 NOTE — Discharge Instructions (Signed)
Will call [facility] to finalize order.

## 2021-06-01 ENCOUNTER — Inpatient Hospital Stay (HOSPITAL_COMMUNITY)
Admission: EM | Admit: 2021-06-01 | Discharge: 2021-06-06 | DRG: 682 | Disposition: A | Discharge status: Home Health | Payer: PRIVATE HEALTH INSURANCE | Attending: Internal Medicine | Admitting: Internal Medicine

## 2021-06-01 ENCOUNTER — Inpatient Hospital Stay (HOSPITAL_COMMUNITY): Payer: PRIVATE HEALTH INSURANCE

## 2021-06-01 ENCOUNTER — Encounter (HOSPITAL_COMMUNITY): Payer: Self-pay

## 2021-06-01 ENCOUNTER — Emergency Department (HOSPITAL_COMMUNITY): Payer: PRIVATE HEALTH INSURANCE

## 2021-06-01 ENCOUNTER — Other Ambulatory Visit: Payer: Self-pay

## 2021-06-01 DIAGNOSIS — E861 Hypovolemia: Secondary | ICD-10-CM | POA: Diagnosis present

## 2021-06-01 DIAGNOSIS — N170 Acute kidney failure with tubular necrosis: Principal | ICD-10-CM | POA: Diagnosis present

## 2021-06-01 DIAGNOSIS — I5032 Chronic diastolic (congestive) heart failure: Secondary | ICD-10-CM | POA: Diagnosis present

## 2021-06-01 DIAGNOSIS — I248 Other forms of acute ischemic heart disease: Secondary | ICD-10-CM | POA: Diagnosis present

## 2021-06-01 DIAGNOSIS — I130 Hypertensive heart and chronic kidney disease with heart failure and stage 1 through stage 4 chronic kidney disease, or unspecified chronic kidney disease: Secondary | ICD-10-CM | POA: Diagnosis present

## 2021-06-01 DIAGNOSIS — Z8719 Personal history of other diseases of the digestive system: Secondary | ICD-10-CM

## 2021-06-01 DIAGNOSIS — L89152 Pressure ulcer of sacral region, stage 2: Secondary | ICD-10-CM | POA: Diagnosis present

## 2021-06-01 DIAGNOSIS — Z9049 Acquired absence of other specified parts of digestive tract: Secondary | ICD-10-CM | POA: Diagnosis not present

## 2021-06-01 DIAGNOSIS — K859 Acute pancreatitis without necrosis or infection, unspecified: Secondary | ICD-10-CM

## 2021-06-01 DIAGNOSIS — I7143 Infrarenal abdominal aortic aneurysm, without rupture: Secondary | ICD-10-CM | POA: Diagnosis present

## 2021-06-01 DIAGNOSIS — M109 Gout, unspecified: Secondary | ICD-10-CM | POA: Diagnosis present

## 2021-06-01 DIAGNOSIS — N184 Chronic kidney disease, stage 4 (severe): Secondary | ICD-10-CM | POA: Diagnosis present

## 2021-06-01 DIAGNOSIS — E1122 Type 2 diabetes mellitus with diabetic chronic kidney disease: Secondary | ICD-10-CM | POA: Diagnosis present

## 2021-06-01 DIAGNOSIS — E872 Acidosis, unspecified: Secondary | ICD-10-CM | POA: Diagnosis present

## 2021-06-01 DIAGNOSIS — I480 Paroxysmal atrial fibrillation: Secondary | ICD-10-CM | POA: Diagnosis present

## 2021-06-01 DIAGNOSIS — Z841 Family history of disorders of kidney and ureter: Secondary | ICD-10-CM

## 2021-06-01 DIAGNOSIS — Z20822 Contact with and (suspected) exposure to covid-19: Secondary | ICD-10-CM | POA: Diagnosis present

## 2021-06-01 DIAGNOSIS — F17210 Nicotine dependence, cigarettes, uncomplicated: Secondary | ICD-10-CM | POA: Diagnosis present

## 2021-06-01 DIAGNOSIS — R52 Pain, unspecified: Secondary | ICD-10-CM

## 2021-06-01 DIAGNOSIS — E875 Hyperkalemia: Secondary | ICD-10-CM | POA: Diagnosis present

## 2021-06-01 DIAGNOSIS — E7800 Pure hypercholesterolemia, unspecified: Secondary | ICD-10-CM | POA: Diagnosis present

## 2021-06-01 DIAGNOSIS — Z8249 Family history of ischemic heart disease and other diseases of the circulatory system: Secondary | ICD-10-CM

## 2021-06-01 DIAGNOSIS — Z888 Allergy status to other drugs, medicaments and biological substances status: Secondary | ICD-10-CM

## 2021-06-01 DIAGNOSIS — Z95828 Presence of other vascular implants and grafts: Secondary | ICD-10-CM

## 2021-06-01 DIAGNOSIS — K8500 Idiopathic acute pancreatitis without necrosis or infection: Secondary | ICD-10-CM | POA: Diagnosis present

## 2021-06-01 DIAGNOSIS — A419 Sepsis, unspecified organism: Secondary | ICD-10-CM

## 2021-06-01 DIAGNOSIS — E860 Dehydration: Secondary | ICD-10-CM | POA: Diagnosis present

## 2021-06-01 DIAGNOSIS — Z833 Family history of diabetes mellitus: Secondary | ICD-10-CM | POA: Diagnosis not present

## 2021-06-01 DIAGNOSIS — Z7982 Long term (current) use of aspirin: Secondary | ICD-10-CM

## 2021-06-01 DIAGNOSIS — N179 Acute kidney failure, unspecified: Secondary | ICD-10-CM | POA: Diagnosis not present

## 2021-06-01 DIAGNOSIS — L8990 Pressure ulcer of unspecified site, unspecified stage: Secondary | ICD-10-CM | POA: Insufficient documentation

## 2021-06-01 DIAGNOSIS — Z803 Family history of malignant neoplasm of breast: Secondary | ICD-10-CM

## 2021-06-01 DIAGNOSIS — Z932 Ileostomy status: Secondary | ICD-10-CM

## 2021-06-01 DIAGNOSIS — Z79899 Other long term (current) drug therapy: Secondary | ICD-10-CM

## 2021-06-01 DIAGNOSIS — N178 Other acute kidney failure: Secondary | ICD-10-CM | POA: Diagnosis not present

## 2021-06-01 DIAGNOSIS — R54 Age-related physical debility: Secondary | ICD-10-CM | POA: Diagnosis present

## 2021-06-01 DIAGNOSIS — Z823 Family history of stroke: Secondary | ICD-10-CM

## 2021-06-01 LAB — PROTIME-INR
INR: 1.2 (ref 0.8–1.2)
Prothrombin Time: 14.8 seconds (ref 11.4–15.2)

## 2021-06-01 LAB — URINALYSIS, ROUTINE W REFLEX MICROSCOPIC
Bilirubin Urine: NEGATIVE
Glucose, UA: NEGATIVE mg/dL
Hgb urine dipstick: NEGATIVE
Ketones, ur: NEGATIVE mg/dL
Leukocytes,Ua: NEGATIVE
Nitrite: NEGATIVE
Protein, ur: NEGATIVE mg/dL
Specific Gravity, Urine: 1.01 (ref 1.005–1.030)
pH: 5 (ref 5.0–8.0)

## 2021-06-01 LAB — LIPID PANEL
Cholesterol: 94 mg/dL (ref 0–200)
HDL: 22 mg/dL — ABNORMAL LOW (ref >40)
LDL Cholesterol: 28 mg/dL (ref 0–99)
Total CHOL/HDL Ratio: 4.3 RATIO
Triglycerides: 218 mg/dL — ABNORMAL HIGH (ref <150)
VLDL: 44 mg/dL — ABNORMAL HIGH (ref 0–40)

## 2021-06-01 LAB — BASIC METABOLIC PANEL
Anion gap: 18 — ABNORMAL HIGH (ref 5–15)
Anion gap: 19 — ABNORMAL HIGH (ref 5–15)
BUN: 144 mg/dL — ABNORMAL HIGH (ref 6–20)
BUN: 138 mg/dL — ABNORMAL HIGH (ref 6–20)
CO2: 13 mmol/L — ABNORMAL LOW (ref 22–32)
CO2: 12 mmol/L — ABNORMAL LOW (ref 22–32)
Calcium: 8.6 mg/dL — ABNORMAL LOW (ref 8.9–10.3)
Calcium: 8.7 mg/dL — ABNORMAL LOW (ref 8.9–10.3)
Chloride: 107 mmol/L (ref 98–111)
Chloride: 106 mmol/L (ref 98–111)
Creatinine, Ser: 15.65 mg/dL — ABNORMAL HIGH (ref 0.44–1.00)
Creatinine, Ser: 14.74 mg/dL — ABNORMAL HIGH (ref 0.44–1.00)
GFR, Estimated: 2 mL/min — ABNORMAL LOW (ref >60)
GFR, Estimated: 3 mL/min — ABNORMAL LOW (ref >60)
Glucose, Bld: 163 mg/dL — ABNORMAL HIGH (ref 70–99)
Glucose, Bld: 187 mg/dL — ABNORMAL HIGH (ref 70–99)
Potassium: 6.2 mmol/L — ABNORMAL HIGH (ref 3.5–5.1)
Potassium: 4.5 mmol/L (ref 3.5–5.1)
Sodium: 138 mmol/L (ref 135–145)
Sodium: 137 mmol/L (ref 135–145)

## 2021-06-01 LAB — RENAL FUNCTION PANEL
Albumin: 3.8 g/dL (ref 3.5–5.0)
Anion gap: 14 (ref 5–15)
BUN: 144 mg/dL — ABNORMAL HIGH (ref 6–20)
CO2: 12 mmol/L — ABNORMAL LOW (ref 22–32)
Calcium: 8.2 mg/dL — ABNORMAL LOW (ref 8.9–10.3)
Chloride: 107 mmol/L (ref 98–111)
Creatinine, Ser: 15.35 mg/dL — ABNORMAL HIGH (ref 0.44–1.00)
GFR, Estimated: 2 mL/min — ABNORMAL LOW (ref >60)
Glucose, Bld: 161 mg/dL — ABNORMAL HIGH (ref 70–99)
Phosphorus: 9.9 mg/dL — ABNORMAL HIGH (ref 2.5–4.6)
Potassium: 5.9 mmol/L — ABNORMAL HIGH (ref 3.5–5.1)
Sodium: 133 mmol/L — ABNORMAL LOW (ref 135–145)

## 2021-06-01 LAB — RAPID URINE DRUG SCREEN, HOSP PERFORMED
Amphetamines: NOT DETECTED
Barbiturates: NOT DETECTED
Benzodiazepines: NOT DETECTED
Cocaine: NOT DETECTED
Opiates: NOT DETECTED
Tetrahydrocannabinol: NOT DETECTED

## 2021-06-01 LAB — COMPREHENSIVE METABOLIC PANEL
ALT: 11 U/L (ref 0–44)
AST: 18 U/L (ref 15–41)
Albumin: 4.6 g/dL (ref 3.5–5.0)
Alkaline Phosphatase: 107 U/L (ref 38–126)
Anion gap: >20 — ABNORMAL HIGH (ref 5–15)
BUN: 137 mg/dL — ABNORMAL HIGH (ref 6–20)
CO2: 10 mmol/L — ABNORMAL LOW (ref 22–32)
Calcium: 9.4 mg/dL (ref 8.9–10.3)
Chloride: 102 mmol/L (ref 98–111)
Creatinine, Ser: 17.36 mg/dL — ABNORMAL HIGH (ref 0.44–1.00)
GFR, Estimated: 2 mL/min — ABNORMAL LOW (ref >60)
Glucose, Bld: 154 mg/dL — ABNORMAL HIGH (ref 70–99)
Potassium: 6.7 mmol/L (ref 3.5–5.1)
Sodium: 134 mmol/L — ABNORMAL LOW (ref 135–145)
Total Bilirubin: 0.9 mg/dL (ref 0.3–1.2)
Total Protein: 8.7 g/dL — ABNORMAL HIGH (ref 6.5–8.1)

## 2021-06-01 LAB — LIPASE, BLOOD: Lipase: 604 U/L — ABNORMAL HIGH (ref 11–51)

## 2021-06-01 LAB — GLUCOSE, CAPILLARY
Glucose-Capillary: 178 mg/dL — ABNORMAL HIGH (ref 70–99)
Glucose-Capillary: 141 mg/dL — ABNORMAL HIGH (ref 70–99)
Glucose-Capillary: 112 mg/dL — ABNORMAL HIGH (ref 70–99)

## 2021-06-01 LAB — TROPONIN I (HIGH SENSITIVITY)
Troponin I (High Sensitivity): 143 ng/L (ref <18)
Troponin I (High Sensitivity): 128 ng/L (ref <18)

## 2021-06-01 LAB — CBC
HCT: 36.4 % (ref 36.0–46.0)
Hemoglobin: 11.2 g/dL — ABNORMAL LOW (ref 12.0–15.0)
MCH: 31.2 pg (ref 26.0–34.0)
MCHC: 30.8 g/dL (ref 30.0–36.0)
MCV: 101.4 fL — ABNORMAL HIGH (ref 80.0–100.0)
Platelets: 328 10*3/uL (ref 150–400)
RBC: 3.59 MIL/uL — ABNORMAL LOW (ref 3.87–5.11)
RDW: 15.9 % — ABNORMAL HIGH (ref 11.5–15.5)
WBC: 8.5 10*3/uL (ref 4.0–10.5)
nRBC: 0 % (ref 0.0–0.2)

## 2021-06-01 LAB — HEMOGLOBIN A1C
Hgb A1c MFr Bld: 5.2 % (ref 4.8–5.6)
Mean Plasma Glucose: 102.54 mg/dL

## 2021-06-01 LAB — RESP PANEL BY RT-PCR (FLU A&B, COVID) ARPGX2
Influenza A by PCR: NEGATIVE
Influenza B by PCR: NEGATIVE
SARS Coronavirus 2 by RT PCR: NEGATIVE

## 2021-06-01 LAB — SODIUM, URINE, RANDOM: Sodium, Ur: 73 mmol/L

## 2021-06-01 LAB — MRSA NEXT GEN BY PCR, NASAL: MRSA by PCR Next Gen: NOT DETECTED

## 2021-06-01 LAB — APTT: aPTT: 32 seconds (ref 24–36)

## 2021-06-01 LAB — LACTIC ACID, PLASMA: Lactic Acid, Venous: 1.2 mmol/L (ref 0.5–1.9)

## 2021-06-01 LAB — CREATININE, URINE, RANDOM: Creatinine, Urine: 121.89 mg/dL

## 2021-06-01 MED ORDER — FENTANYL CITRATE (PF) 100 MCG/2ML IJ SOLN
25.0000 ug | INTRAMUSCULAR | Status: DC | PRN
Start: 2021-06-01 — End: 2021-06-06

## 2021-06-01 MED ORDER — SODIUM CHLORIDE 0.9% FLUSH
3.0000 mL | INTRAVENOUS | Status: DC | PRN
  Administered 2021-06-04: 3 mL via INTRAVENOUS

## 2021-06-01 MED ORDER — ONDANSETRON HCL 4 MG/2ML IJ SOLN
4.0000 mg | Freq: Once | INTRAMUSCULAR | Status: AC
  Administered 2021-06-01: 4 mg via INTRAVENOUS
  Filled 2021-06-01: qty 2

## 2021-06-01 MED ORDER — SODIUM CHLORIDE 0.9% FLUSH
3.0000 mL | Freq: Two times a day (BID) | INTRAVENOUS | Status: DC
  Administered 2021-06-01 – 2021-06-05 (×7): 3 mL via INTRAVENOUS

## 2021-06-01 MED ORDER — SODIUM BICARBONATE 8.4 % IV SOLN
50.0000 meq | Freq: Once | INTRAVENOUS | Status: AC
  Administered 2021-06-01: 50 meq via INTRAVENOUS

## 2021-06-01 MED ORDER — FENTANYL CITRATE PF 50 MCG/ML IJ SOSY
25.0000 ug | PREFILLED_SYRINGE | INTRAMUSCULAR | Status: DC | PRN
Start: 2021-06-01 — End: 2021-06-01

## 2021-06-01 MED ORDER — THIAMINE HCL 100 MG PO TABS
100.0000 mg | ORAL_TABLET | Freq: Every day | ORAL | Status: DC
  Administered 2021-06-01 – 2021-06-06 (×6): 100 mg via ORAL
  Filled 2021-06-01 (×6): qty 1

## 2021-06-01 MED ORDER — ACETAMINOPHEN 325 MG PO TABS
650.0000 mg | ORAL_TABLET | Freq: Four times a day (QID) | ORAL | Status: DC | PRN
  Administered 2021-06-02 – 2021-06-04 (×6): 650 mg via ORAL
  Filled 2021-06-01 (×6): qty 2

## 2021-06-01 MED ORDER — DEXTROSE 50 % IV SOLN
1.0000 | Freq: Once | INTRAVENOUS | Status: AC
  Administered 2021-06-01: 50 mL via INTRAVENOUS
  Filled 2021-06-01: qty 50

## 2021-06-01 MED ORDER — SODIUM CHLORIDE 0.9% FLUSH
3.0000 mL | Freq: Two times a day (BID) | INTRAVENOUS | Status: DC
  Administered 2021-06-01 – 2021-06-05 (×7): 3 mL via INTRAVENOUS

## 2021-06-01 MED ORDER — INSULIN ASPART 100 UNIT/ML IV SOLN
5.0000 [IU] | Freq: Once | INTRAVENOUS | Status: AC
  Administered 2021-06-01: 5 [IU] via INTRAVENOUS

## 2021-06-01 MED ORDER — SODIUM BICARBONATE 8.4 % IV SOLN
INTRAVENOUS | Status: AC
  Administered 2021-06-01: 50 meq via INTRAVENOUS
  Filled 2021-06-01: qty 50

## 2021-06-01 MED ORDER — FOLIC ACID 1 MG PO TABS
1.0000 mg | ORAL_TABLET | Freq: Every day | ORAL | Status: DC
  Administered 2021-06-01 – 2021-06-06 (×6): 1 mg via ORAL
  Filled 2021-06-01 (×6): qty 1

## 2021-06-01 MED ORDER — BISACODYL 10 MG RE SUPP: 10.0000 mg | Freq: Every day | RECTAL | Status: DC | PRN

## 2021-06-01 MED ORDER — ORAL CARE MOUTH RINSE
15.0000 mL | Freq: Two times a day (BID) | OROMUCOSAL | Status: DC
  Administered 2021-06-02 – 2021-06-04 (×4): 15 mL via OROMUCOSAL

## 2021-06-01 MED ORDER — SODIUM CHLORIDE 0.9 % IV BOLUS
2500.0000 mL | Freq: Once | INTRAVENOUS | Status: AC
  Administered 2021-06-01 (×2): 2500 mL via INTRAVENOUS

## 2021-06-01 MED ORDER — SODIUM CHLORIDE 0.9 % IV SOLN
1.0000 g | INTRAVENOUS | Status: DC
  Filled 2021-06-01: qty 1

## 2021-06-01 MED ORDER — OXYCODONE HCL 5 MG PO TABS
5.0000 mg | ORAL_TABLET | ORAL | Status: DC | PRN
Start: 2021-06-01 — End: 2021-06-01

## 2021-06-01 MED ORDER — ONDANSETRON HCL 4 MG/2ML IJ SOLN
4.0000 mg | Freq: Four times a day (QID) | INTRAMUSCULAR | Status: DC | PRN
  Administered 2021-06-02 (×2): 4 mg via INTRAVENOUS
  Filled 2021-06-01 (×2): qty 2

## 2021-06-01 MED ORDER — SODIUM ZIRCONIUM CYCLOSILICATE 5 G PO PACK
10.0000 g | PACK | Freq: Once | ORAL | Status: AC
  Administered 2021-06-01: 10 g via ORAL
  Filled 2021-06-01: qty 2

## 2021-06-01 MED ORDER — DIAZEPAM 2 MG PO TABS
2.0000 mg | ORAL_TABLET | Freq: Three times a day (TID) | ORAL | Status: DC
  Administered 2021-06-01: 2 mg via ORAL
  Filled 2021-06-01: qty 1

## 2021-06-01 MED ORDER — PANTOPRAZOLE SODIUM 40 MG PO TBEC
40.0000 mg | DELAYED_RELEASE_TABLET | Freq: Every day | ORAL | Status: DC
  Administered 2021-06-01 – 2021-06-06 (×6): 40 mg via ORAL
  Filled 2021-06-01 (×6): qty 1

## 2021-06-01 MED ORDER — ADULT MULTIVITAMIN W/MINERALS CH
1.0000 | ORAL_TABLET | Freq: Every day | ORAL | Status: DC
  Administered 2021-06-01 – 2021-06-06 (×6): 1 via ORAL
  Filled 2021-06-01 (×6): qty 1

## 2021-06-01 MED ORDER — CHLORHEXIDINE GLUCONATE CLOTH 2 % EX PADS
6.0000 | MEDICATED_PAD | Freq: Every day | CUTANEOUS | Status: DC
  Administered 2021-06-02 – 2021-06-06 (×4): 6 via TOPICAL

## 2021-06-01 MED ORDER — FERROUS SULFATE 325 (65 FE) MG PO TABS
325.0000 mg | ORAL_TABLET | Freq: Every day | ORAL | Status: DC
  Administered 2021-06-02 – 2021-06-06 (×5): 325 mg via ORAL
  Filled 2021-06-01 (×6): qty 1

## 2021-06-01 MED ORDER — SODIUM CHLORIDE 0.9 % IV SOLN
250.0000 mL | INTRAVENOUS | Status: DC | PRN
  Administered 2021-06-02: 02:00:00 250 mL via INTRAVENOUS

## 2021-06-01 MED ORDER — SODIUM BICARBONATE 8.4 % IV SOLN: 50.0000 meq | Freq: Once | INTRAVENOUS | Status: AC

## 2021-06-01 MED ORDER — ONDANSETRON HCL 4 MG PO TABS: 4.0000 mg | ORAL_TABLET | Freq: Four times a day (QID) | ORAL | Status: DC | PRN

## 2021-06-01 MED ORDER — INSULIN ASPART 100 UNIT/ML IJ SOLN: 0.0000 [IU] | Freq: Every day | INTRAMUSCULAR | Status: DC

## 2021-06-01 MED ORDER — METOPROLOL TARTRATE 25 MG PO TABS
25.0000 mg | ORAL_TABLET | Freq: Two times a day (BID) | ORAL | Status: DC
  Administered 2021-06-01 – 2021-06-06 (×11): 25 mg via ORAL
  Filled 2021-06-01 (×11): qty 1

## 2021-06-01 MED ORDER — LORAZEPAM 1 MG PO TABS: 1.0000 mg | ORAL_TABLET | ORAL | Status: DC | PRN

## 2021-06-01 MED ORDER — NICOTINE 14 MG/24HR TD PT24
14.0000 mg | MEDICATED_PATCH | Freq: Every day | TRANSDERMAL | Status: DC
Start: 2021-06-01 — End: 2021-06-01
  Administered 2021-06-01: 14 mg via TRANSDERMAL
  Filled 2021-06-01: qty 1

## 2021-06-01 MED ORDER — ADULT MULTIVITAMIN W/MINERALS CH: 1.0000 | ORAL_TABLET | Freq: Every day | ORAL | Status: DC

## 2021-06-01 MED ORDER — LORAZEPAM 2 MG/ML IJ SOLN: 1.0000 mg | INTRAMUSCULAR | Status: DC | PRN

## 2021-06-01 MED ORDER — DIAZEPAM 2 MG PO TABS
2.0000 mg | ORAL_TABLET | Freq: Three times a day (TID) | ORAL | Status: DC | PRN
Start: 2021-06-01 — End: 2021-06-06
  Administered 2021-06-02: 2 mg via ORAL
  Filled 2021-06-01: qty 1

## 2021-06-01 MED ORDER — SODIUM BICARBONATE 8.4 % IV SOLN
INTRAVENOUS | Status: AC
  Filled 2021-06-01: qty 50

## 2021-06-01 MED ORDER — SODIUM CHLORIDE 0.45 % IV SOLN
INTRAVENOUS | Status: DC
  Filled 2021-06-01 (×10): qty 75

## 2021-06-01 MED ORDER — CALCIUM GLUCONATE 10 % IV SOLN
1.0000 g | Freq: Once | INTRAVENOUS | Status: AC
  Administered 2021-06-01: 1 g via INTRAVENOUS
  Filled 2021-06-01: qty 10

## 2021-06-01 MED ORDER — METRONIDAZOLE 500 MG/100ML IV SOLN
500.0000 mg | Freq: Once | INTRAVENOUS | Status: AC
  Administered 2021-06-01: 500 mg via INTRAVENOUS
  Filled 2021-06-01: qty 100

## 2021-06-01 MED ORDER — FUROSEMIDE 10 MG/ML IJ SOLN
40.0000 mg | Freq: Once | INTRAMUSCULAR | Status: AC
  Administered 2021-06-01: 40 mg via INTRAVENOUS
  Filled 2021-06-01: qty 4

## 2021-06-01 MED ORDER — TRAZODONE HCL 50 MG PO TABS
50.0000 mg | ORAL_TABLET | Freq: Every evening | ORAL | Status: DC | PRN
  Administered 2021-06-01 – 2021-06-05 (×4): 50 mg via ORAL
  Filled 2021-06-01 (×4): qty 1

## 2021-06-01 MED ORDER — CHLORHEXIDINE GLUCONATE 0.12 % MT SOLN
15.0000 mL | Freq: Two times a day (BID) | OROMUCOSAL | Status: DC
  Administered 2021-06-01 – 2021-06-06 (×5): 15 mL via OROMUCOSAL
  Filled 2021-06-01 (×8): qty 15

## 2021-06-01 MED ORDER — INSULIN ASPART 100 UNIT/ML IJ SOLN: 0.0000 [IU] | Freq: Three times a day (TID) | INTRAMUSCULAR | Status: DC

## 2021-06-01 MED ORDER — INSULIN ASPART 100 UNIT/ML IV SOLN
10.0000 [IU] | Freq: Once | INTRAVENOUS | Status: AC
  Administered 2021-06-01: 10 [IU] via INTRAVENOUS

## 2021-06-01 MED ORDER — POLYETHYLENE GLYCOL 3350 17 G PO PACK: 17.0000 g | PACK | Freq: Every day | ORAL | Status: DC | PRN

## 2021-06-01 MED ORDER — ALBUTEROL SULFATE (2.5 MG/3ML) 0.083% IN NEBU
10.0000 mg | INHALATION_SOLUTION | Freq: Once | RESPIRATORY_TRACT | Status: AC
  Administered 2021-06-01: 10 mg via RESPIRATORY_TRACT
  Filled 2021-06-01: qty 12

## 2021-06-01 MED ORDER — HEPARIN SODIUM (PORCINE) 5000 UNIT/ML IJ SOLN
5000.0000 [IU] | Freq: Three times a day (TID) | INTRAMUSCULAR | Status: DC
  Administered 2021-06-02 – 2021-06-06 (×13): 5000 [IU] via SUBCUTANEOUS
  Filled 2021-06-01 (×13): qty 1

## 2021-06-01 MED ORDER — SODIUM CHLORIDE 0.9 % IV SOLN
2.0000 g | Freq: Once | INTRAVENOUS | Status: AC
  Administered 2021-06-01: 2 g via INTRAVENOUS
  Filled 2021-06-01: qty 2

## 2021-06-01 MED ORDER — SODIUM ZIRCONIUM CYCLOSILICATE 10 G PO PACK
10.0000 g | PACK | Freq: Three times a day (TID) | ORAL | Status: AC
  Administered 2021-06-01 – 2021-06-02 (×3): 10 g via ORAL
  Filled 2021-06-01 (×3): qty 1

## 2021-06-01 MED ORDER — CALCIUM GLUCONATE-NACL 1-0.675 GM/50ML-% IV SOLN
1.0000 g | Freq: Once | INTRAVENOUS | Status: AC
  Administered 2021-06-01: 1000 mg via INTRAVENOUS
  Filled 2021-06-01: qty 50

## 2021-06-01 MED ORDER — SODIUM CHLORIDE 0.9 % IV BOLUS
2500.0000 mL | Freq: Once | INTRAVENOUS | Status: AC
  Administered 2021-06-01: 2500 mL via INTRAVENOUS

## 2021-06-01 MED ORDER — AMIODARONE HCL 200 MG PO TABS
200.0000 mg | ORAL_TABLET | Freq: Every day | ORAL | Status: DC
  Administered 2021-06-01 – 2021-06-06 (×6): 200 mg via ORAL
  Filled 2021-06-01 (×6): qty 1

## 2021-06-01 MED ORDER — THIAMINE HCL 100 MG/ML IJ SOLN: 100.0000 mg | Freq: Every day | INTRAMUSCULAR | Status: DC

## 2021-06-01 MED ORDER — ACETAMINOPHEN 650 MG RE SUPP: 650.0000 mg | Freq: Four times a day (QID) | RECTAL | Status: DC | PRN

## 2021-06-01 NOTE — ED Notes (Signed)
Critical Lab: Troponin 143, provider notified.

## 2021-06-01 NOTE — Progress Notes (Signed)
Pharmacy Antibiotic Note  Dorothy Harris is a 61 y.o. female admitted on 06/01/2021 with sepsis, wound infection. Pharmacy has been consulted for Cefepime dosing.  Plan: Cefepime 2gm x 1 on 2/25.  F/U SCr / renal fxn for additional dosing.  ClCr < 84ml/min, SCr 17.36  Height: $Remove'5\' 7"'WQDiZiI$  (170.2 cm) Weight: 80.7 kg (178 lb) IBW/kg (Calculated) : 61.6  Temp (24hrs), Avg:98.2 F (36.8 C), Min:98.2 F (36.8 C), Max:98.2 F (36.8 C)  Recent Labs  Lab 06/01/21 0958  WBC 8.5  CREATININE 17.36*  LATICACIDVEN 1.2    Estimated Creatinine Clearance: 3.8 mL/min (A) (by C-G formula based on SCr of 17.36 mg/dL (H)).    Allergies  Allergen Reactions   Chlorthalidone     ELEVATED KIDNEY FUNCTION     Antimicrobials this admission: Cefepime 2/25 >>   Dose adjustments this admission:   Microbiology results:  BCx: pending  UCx:    Sputum:    MRSA PCR:   Thank you for allowing pharmacy to be a part of this patients care.  Hart Robinsons A 06/01/2021 12:14 PM

## 2021-06-01 NOTE — ED Provider Notes (Signed)
I reviewed the EKG with cardiologist Dr. Angelena Form and he felt like the patient's EKG is not any different from an EKG he can see in January.  I was unable to see that EKG but I do see an EKG in December that looks similar also   Milton Ferguson, MD 06/01/21 1314

## 2021-06-01 NOTE — ED Notes (Addendum)
EKG exported and Dr Denton Brick called and informed. Dr Roderic Palau given EKG to review. Dr Denton Brick to come to unit to assess patient.

## 2021-06-01 NOTE — ED Notes (Addendum)
Spoke to Dr Denton Brick and informed just finished giving sodium bicarb, insulin, dextrose, calcium gluconate and lokelma. Orders received to repeat EKG at 1245. Give sodium Bicarb now and albuterol now and to hold other medications until after repeat EKG. Pharmacy called and informed need another sodium bicarb for patient. Per Nicki Reaper, will bring one down.

## 2021-06-01 NOTE — Progress Notes (Signed)
[facility] following for sepsis protocol. 
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
## 2021-06-01 NOTE — ED Notes (Signed)
Lab at bedside

## 2021-06-01 NOTE — ED Notes (Signed)
Paged [facility] for Dr. Roderic Palau. Waiting for call back.

## 2021-06-01 NOTE — ED Notes (Signed)
Critical lab: Potassium 6.7.  Provider notified.

## 2021-06-01 NOTE — ED Notes (Signed)
Open abd wound packed with gauze with foul purulent drainage.
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
## 2021-06-01 NOTE — Progress Notes (Signed)
Pharmacy Antibiotic Note  Dorothy Harris is a 61 y.o. female admitted on 06/01/2021 with sepsis.  Pharmacy has been consulted for Cefepime dosing. Pt received Cefepime 2gm IV at [facility] ED. Now transferred to [facility] ICU.  Plan: Cefepime 1gm IV q24h Will f/u renal function, micro data, and pt's clinical condition  Height: $Remove'5\' 7"'oaYKVDC$  (170.2 cm) Weight: 80.4 kg (177 lb 4 oz) IBW/kg (Calculated) : 61.6  Temp (24hrs), Avg:98.2 F (36.8 C), Min:98.1 F (36.7 C), Max:98.2 F (36.8 C)  Recent Labs  Lab 06/01/21 0958 06/01/21 1314  WBC 8.5  --   CREATININE 17.36* 15.35*   15.65*  LATICACIDVEN 1.2  --     Estimated Creatinine Clearance: 4.2 mL/min (A) (by C-G formula based on SCr of 15.65 mg/dL (H)).    Allergies  Allergen Reactions   Chlorthalidone     ELEVATED KIDNEY FUNCTION     Antimicrobials this admission: 2/25 Cefepime >>   Microbiology results: 2/25 BCx:  2/25 MRSA PCR: negative  Thank you for allowing pharmacy to be a part of this patients care.  Sherlon Handing, PharmD, BCPS Please see amion for complete clinical pharmacist phone list 06/01/2021 6:18 PM

## 2021-06-01 NOTE — ED Notes (Signed)
Respiratory at bedside.
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
## 2021-06-01 NOTE — ED Triage Notes (Addendum)
Patient presents with n/v x 1 week and inability to hold anything down.  Has not been seen for this yet.  Patient recently had AAA repair in 03/2021 along with hernia repair and colostomy.  Patient still has bandage to midline abd and reports it was changed Thursday.  Reports she drinks 1 can of beer a day but hasnt since 2/18 and hasnt smoked since then either.

## 2021-06-01 NOTE — ED Notes (Signed)
Date and time results received: 06/01/21 1:54 PM    Test: potassium Critical Value: 6.2  Name of Provider Notified: Dr. Joesph Fillers  Orders Received? Or Actions Taken?: see orders

## 2021-06-01 NOTE — ED Notes (Signed)
Dr Roderic Palau at bedside. Pt continues to deny pain.

## 2021-06-01 NOTE — ED Provider Notes (Signed)
[facility] EMERGENCY DEPARTMENT Provider Note   CSN: [id number] Arrival date & time: 06/01/21  0017     History  Chief Complaint  Patient presents with   Emesis    Dorothy Harris is a 61 y.o. female.  Patient presents with vomiting for a week.  Patient has a history of hypertension diabetes and kidney disease.  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  Patient complains of weakness and vomiting  The history is provided by the patient and medical records. No language interpreter was used.  Emesis Severity:  Moderate Timing:  Intermittent Quality:  Bilious material Able to tolerate:  Liquids Progression:  Unchanged Chronicity:  New Recent urination:  Normal Relieved by:  Nothing Worsened by:  Nothing Ineffective treatments:  None tried Associated symptoms: no abdominal pain, no cough, no diarrhea and no headaches       Home Medications Prior to Admission medications   Medication Sig Start Date End Date Taking? Authorizing Provider  acetaminophen (TYLENOL) 325 MG tablet Take 1-2 tablets (325-650 mg total) by mouth every 4 (four) hours as needed for mild pain. 04/25/21   Angiulli, Lavon Paganini, PA-C  amiodarone (PACERONE) 200 MG tablet Take 1 tablet (200 mg total) by mouth daily. 05/27/21   Skeet Latch, MD  amLODipine (NORVASC) 10 MG tablet Take 1 tablet (10 mg total) by mouth daily. 05/28/21 08/26/21  Skeet Latch, MD  aspirin 81 MG chewable tablet Chew 4 tablets (324 mg total) by mouth daily. 04/25/21   Angiulli, Lavon Paganini, PA-C  atorvastatin (LIPITOR) 80 MG tablet Take 1 tablet (80 mg total) by mouth daily. 05/27/21   Skeet Latch, MD  Blood Pressure KIT MONITOR YOUR BLOOD PRESSURE TWICE A DAY  DX I10 05/07/21   Skeet Latch, MD  ferrous sulfate 325 (65 FE) MG tablet Take 1 tablet (325 mg total) by mouth daily with breakfast. 04/25/21   Angiulli, Lavon Paganini, PA-C  methocarbamol (ROBAXIN)  500 MG tablet Take 1 tablet (500 mg total) by mouth every 6 (six) hours as needed for muscle spasms. 04/25/21   Angiulli, Lavon Paganini, PA-C  metoprolol tartrate (LOPRESSOR) 25 MG tablet Take 1 tablet (25 mg total) by mouth 2 (two) times daily. 05/27/21   Skeet Latch, MD  Multiple Vitamin (MULTIVITAMIN WITH MINERALS) TABS tablet Take 1 tablet by mouth daily. 04/13/21   Florencia Reasons, MD  nitroGLYCERIN (NITROSTAT) 0.4 MG SL tablet Place 1 tablet (0.4 mg total) under the tongue every 5 (five) minutes as needed for chest pain. 12/24/15   Charolette Forward, MD  oxyCODONE (OXY IR/ROXICODONE) 5 MG immediate release tablet Take 1-2 tablets (5-10 mg total) by mouth every 4 (four) hours as needed for moderate pain. 04/25/21   Angiulli, Lavon Paganini, PA-C  pantoprazole (PROTONIX) 40 MG tablet Take 1 tablet (40 mg total) by mouth daily. 05/27/21   Skeet Latch, MD      Allergies    Chlorthalidone    Review of Systems   Review of Systems  Constitutional:  Negative for appetite change and fatigue.  HENT:  Negative for congestion, ear discharge and sinus pressure.   Eyes:  Negative for discharge.  Respiratory:  Negative for cough.   Cardiovascular:  Negative for chest pain.  Gastrointestinal:  Positive for vomiting. Negative for abdominal pain and diarrhea.  Genitourinary:  Negative for frequency and hematuria.  Musculoskeletal:  Negative for back pain.  Skin:  Negative for rash.  Neurological:  Negative for seizures and headaches.  Psychiatric/Behavioral:  Negative for hallucinations.    Physical Exam Updated Vital Signs BP 122/64    Pulse 72    Temp 98.2 F (36.8 C) (Oral)    Resp 15    Ht _0  (1.702 m)    Wt 80.7 kg    SpO2 100%    BMI 27.88 kg/m  Physical Exam Vitals and nursing note reviewed.  Constitutional:      Appearance: She is well-developed.  HENT:     Head: Normocephalic.     Nose: Nose normal.  Eyes:     General: No scleral icterus.    Conjunctiva/sclera: Conjunctivae normal.  Neck:      Thyroid: No thyromegaly.  Cardiovascular:     Rate and Rhythm: Normal rate and regular rhythm.     Heart sounds: No murmur heard.   No friction rub. No gallop.  Pulmonary:     Breath sounds: No stridor. No wheezing or rales.  Chest:     Chest wall: No tenderness.  Abdominal:     General: There is no distension.     Tenderness: There is no abdominal tenderness. There is no rebound.     Comments: Ileostomy bag placed  Musculoskeletal:        General: Normal range of motion.     Cervical back: Neck supple.  Lymphadenopathy:     Cervical: No cervical adenopathy.  Skin:    Findings: No erythema or rash.  Neurological:     Mental Status: She is alert and oriented to person, place, and time.     Motor: No abnormal muscle tone.     Coordination: Coordination normal.  Psychiatric:        Behavior: Behavior normal.    ED Results / Procedures / Treatments   Labs (all labs ordered are listed, but only abnormal results are displayed) Labs Reviewed  LIPASE, BLOOD - Abnormal; Notable for the following components:      Result Value   Lipase 604 (*)    All other components within normal limits  COMPREHENSIVE METABOLIC PANEL - Abnormal; Notable for the following components:   Sodium 134 (*)    Potassium 6.7 (*)    CO2 10 (*)    Glucose, Bld 154 (*)    BUN 137 (*)    Creatinine, Ser 17.36 (*)    Total Protein 8.7 (*)    GFR, Estimated 2 (*)    Anion gap >20 (*)    All other components within normal limits  CBC - Abnormal; Notable for the following components:   RBC 3.59 (*)    Hemoglobin 11.2 (*)    MCV 101.4 (*)    RDW 15.9 (*)    All other components within normal limits  RESP PANEL BY RT-PCR (FLU A&B, COVID) ARPGX2  CULTURE, BLOOD (ROUTINE X 2)  CULTURE, BLOOD (ROUTINE X 2)  URINE CULTURE  LACTIC ACID, PLASMA  PROTIME-INR  APTT  URINALYSIS, ROUTINE W REFLEX MICROSCOPIC  BASIC METABOLIC PANEL  POTASSIUM  POTASSIUM  POTASSIUM  RAPID URINE DRUG SCREEN, HOSP PERFORMED   POC URINE PREG, ED    EKG EKG Interpretation  Date/Time:  Saturday June 01 2021 10:03:36 EST Ventricular Rate:  75 PR Interval:  221 QRS Duration: 118 QT Interval:  426 QTC Calculation: 476 R Axis:   -50 Text Interpretation: Sinus rhythm Prolonged PR interval Probable left atrial enlargement LVH with IVCD, LAD and secondary repol abnrm Inferior infarct, acute (RCA) Probable RV involvement,  suggest recording right precordial leads Baseline wander in lead(s) V2 >>> Acute MI <<< Confirmed by Milton Ferguson [phone]) on 06/01/2021 11:35:54 AM  Radiology CT ABDOMEN PELVIS WO CONTRAST  Result Date: 06/01/2021 CLINICAL DATA:  Abdominal pain and nausea and vomiting for 1 week. 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 EXAM: CT ABDOMEN AND PELVIS WITHOUT CONTRAST TECHNIQUE: Multidetector CT imaging of the abdomen and pelvis was performed following the standard protocol without IV contrast. RADIATION DOSE REDUCTION: This exam was performed according to the departmental dose-optimization program which includes automated exposure control, adjustment of the mA and/or kV according to patient size and/or use of iterative reconstruction technique. COMPARISON:  04/08/2021 FINDINGS: Lower chest: Mild right lower lobe scarring versus atelectasis. Hepatobiliary: Fluid attenuation cyst is again seen in the caudate lobe measuring 5.5 cm in maximum diameter. No other liver lesions identified on this unenhanced exam. High attenuation sludge is noted within the gallbladder, however there is no evidence of cholecystitis or biliary ductal dilatation. Pancreas: No mass or inflammatory process visualized on this unenhanced exam. Spleen:  Within normal limits in size. Adrenals/Urinary tract: No evidence of urolithiasis or hydronephrosis. Unremarkable unopacified urinary bladder. Stomach/Bowel: Postop changes are seen from right hemicolectomy with right abdominal  ileostomy. No evidence of bowel obstruction, acute inflammatory process, or abnormal fluid collections. No evidence of extraluminal air or abscess. Vascular/Lymphatic: No pathologically enlarged lymph nodes identified. 4.9 cm infrarenal abdominal aortic aneurysm shows no significant change since prior study. No evidence of aneurysm leak or rupture. Reproductive:  No mass or other significant abnormality. Other: Postop changes again seen within the paraumbilical region with 2 tiny paraumbilical hernias which contain only fat. Musculoskeletal:  No suspicious bone lesions identified. IMPRESSION: No acute findings. Stable 4.9 cm infrarenal abdominal aortic aneurysm. Recommend follow-up every 6 months and vascular consultation if not already obtained. This recommendation follows ACR consensus guidelines: White Paper of the ACR Incidental Findings Committee II on Vascular Findings. J Am Coll Radiol 2013; 10:789-794. Gallbladder sludge, without radiographic evidence of cholecystitis. Electronically Signed   By: Marlaine Hind M.D.   On: 06/01/2021 11:09   DG Chest 1 View  Result Date: 06/01/2021 CLINICAL DATA:  Questionable sepsis - evaluate for abnormality EXAM: CHEST  1 VIEW COMPARISON:  05/07/2021 FINDINGS: Stable cardiomediastinal contours. 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 No new consolidation or edema. No pleural effusion or pneumothorax. IMPRESSION: No acute process in the chest. Electronically Signed   By: Macy Mis M.D.   On: 06/01/2021 11:06    Procedures Procedures    Medications Ordered in ED Medications  calcium gluconate 1 g/ 50 mL sodium chloride IVPB (has no administration in time range)  furosemide (LASIX) injection 40 mg (has no administration in time range)  sodium zirconium cyclosilicate (LOKELMA) packet 10 g (has no administration in time range)  calcium gluconate inj 10% (1 g) URGENT USE ONLY! (has no administration in time range)  albuterol (PROVENTIL) (2.5  MG/3ML) 0.083% nebulizer solution 10 mg (has no administration in time range)  insulin aspart (novoLOG) injection 10 Units (has no administration in time range)    And  dextrose 50 % solution 50 mL (has no administration in time range)  sodium bicarbonate injection 50 mEq (has no administration in time range)  sodium bicarbonate 75 mEq in sodium chloride 0.45 % 1,075 mL infusion (has no administration in time range)  LORazepam (ATIVAN) tablet 1-4 mg (has no administration in time range)  Or  LORazepam (ATIVAN) injection 1-4 mg (has no administration in time range)  thiamine tablet 100 mg (has no administration in time range)    Or  thiamine (B-1) injection 100 mg (has no administration in time range)  folic acid (FOLVITE) tablet 1 mg (has no administration in time range)  multivitamin with minerals tablet 1 tablet (has no administration in time range)  diazepam (VALIUM) tablet 2 mg (has no administration in time range)  nicotine (NICODERM CQ - dosed in mg/24 hours) patch 14 mg (has no administration in time range)  ceFEPIme (MAXIPIME) 2 g in sodium chloride 0.9 % 100 mL IVPB (0 g Intravenous Stopped 06/01/21 1033)  metroNIDAZOLE (FLAGYL) IVPB 500 mg (0 mg Intravenous Stopped 06/01/21 1133)  sodium chloride 0.9 % bolus 2,500 mL (2,500 mLs Intravenous New Bag/Given 06/01/21 1009)  ondansetron (ZOFRAN) injection 4 mg (4 mg Intravenous Given 06/01/21 1137)  sodium zirconium cyclosilicate (LOKELMA) packet 10 g (10 g Oral Given 06/01/21 1214)  insulin aspart (novoLOG) injection 5 Units (5 Units Intravenous Given 06/01/21 1213)    And  dextrose 50 % solution 50 mL (50 mLs Intravenous Given 06/01/21 1211)  sodium bicarbonate injection 50 mEq (50 mEq Intravenous Given 06/01/21 1209)    ED Course/ Medical Decision Making/ A&P  CRITICAL CARE Performed by: Milton Ferguson Total critical care time: 45 minutes Critical care time was exclusive of separately billable procedures and treating other  patients. Critical care was necessary to treat or prevent imminent or life-threatening deterioration. Critical care was time spent personally by me on the following activities: development of treatment plan with patient and/or surrogate as well as nursing, discussions with consultants, evaluation of patient's response to treatment, examination of patient, obtaining history from patient or surrogate, ordering and performing treatments and interventions, ordering and review of laboratory studies, ordering and review of radiographic studies, pulse oximetry and re-evaluation of patient's condition.                          Medical Decision Making Amount and/or Complexity of Data Reviewed Labs: ordered. Radiology: ordered. ECG/medicine tests: ordered.  Risk OTC drugs. Prescription drug management. Decision regarding hospitalization.   Patient with renal failure and pancreatitis.  I spoke with nephrology Dr. Marval Regal and he recommended admitting the patient to [facility] because she may need dialysis.  I called the hospitalist who will make the arrangements for the admission    This patient presents to the ED for concern of vomiting, this involves an extensive number of treatment options, and is a complaint that carries with it a high risk of complications and morbidity.  The differential diagnosis includes bowel obstruction, abscess in abdomen   Co morbidities that complicate the patient evaluation  Ascending aorta surgery, recent bowel surgery   Additional history obtained:  Additional history obtained from relative External records from outside source obtained and reviewed including hospital records   Lab Tests:  I Ordered, and personally interpreted labs.  The pertinent results include: Chemistry shows patient has renal failure now with creatinine of 15 she also has hyperkalemia   Imaging Studies ordered:  I ordered imaging studies including chest x-ray and CT  abdomen I independently visualized and interpreted imaging which showed chest x-ray unremarkable, no acute findings on abdominal exam I agree with the radiologist interpretation   Cardiac Monitoring:  The patient was maintained on a cardiac monitor.  I personally viewed and interpreted the cardiac monitored which showed an underlying  rhythm of: Normal sinus rhythm   Medicines ordered and prescription drug management:  I ordered medication including calcium, bicarb, insulin and glucose for hyperkalemia Reevaluation of the patient after these medicines showed that the patient improved I have reviewed the patients home medicines and have made adjustments as needed   Test Considered:  Renal ultrasound   Critical Interventions:  Admission to [facility] and nephrology consult with possible dialysis   Consultations Obtained:  I requested consultation with the nephrology and hospitalist,  and discussed lab and imaging findings as well as pertinent plan - they recommend: Admission to [facility]   Problem List / ED Course:  Renal failure   Reevaluation:  After the interventions noted above, I reevaluated the patient and found that they have :stayed the same   Social Determinants of Health:  None    Dispostion:  After consideration of the diagnostic results and the patients response to treatment, I feel that the patent would benefit from transfer to [facility] with nephrology consult.         Final Clinical Impression(s) / ED Diagnoses Final diagnoses:  Idiopathic acute pancreatitis without infection or necrosis  Acute renal failure with tubular necrosis [facility])    Rx / DC Orders ED Discharge Orders     None         Milton Ferguson, MD 06/03/21 [phone]

## 2021-06-01 NOTE — ED Notes (Signed)
Respiratory called and informed order for albuterol neb to be given now.
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
## 2021-06-02 DIAGNOSIS — E875 Hyperkalemia: Secondary | ICD-10-CM

## 2021-06-02 DIAGNOSIS — L8990 Pressure ulcer of unspecified site, unspecified stage: Secondary | ICD-10-CM | POA: Insufficient documentation

## 2021-06-02 LAB — CBC
HCT: 26.6 % — ABNORMAL LOW (ref 36.0–46.0)
Hemoglobin: 8.4 g/dL — ABNORMAL LOW (ref 12.0–15.0)
MCH: 31.1 pg (ref 26.0–34.0)
MCHC: 31.6 g/dL (ref 30.0–36.0)
MCV: 98.5 fL (ref 80.0–100.0)
Platelets: 218 10*3/uL (ref 150–400)
RBC: 2.7 MIL/uL — ABNORMAL LOW (ref 3.87–5.11)
RDW: 15.8 % — ABNORMAL HIGH (ref 11.5–15.5)
WBC: 6.5 10*3/uL (ref 4.0–10.5)
nRBC: 0 % (ref 0.0–0.2)

## 2021-06-02 LAB — BLOOD CULTURE ID PANEL (REFLEXED) - BCID2

## 2021-06-02 LAB — COMPREHENSIVE METABOLIC PANEL
ALT: 10 U/L (ref 0–44)
AST: 17 U/L (ref 15–41)
Albumin: 2.9 g/dL — ABNORMAL LOW (ref 3.5–5.0)
Alkaline Phosphatase: 68 U/L (ref 38–126)
Anion gap: 15 (ref 5–15)
BUN: 123 mg/dL — ABNORMAL HIGH (ref 6–20)
CO2: 14 mmol/L — ABNORMAL LOW (ref 22–32)
Calcium: 7.8 mg/dL — ABNORMAL LOW (ref 8.9–10.3)
Chloride: 109 mmol/L (ref 98–111)
Creatinine, Ser: 13.06 mg/dL — ABNORMAL HIGH (ref 0.44–1.00)
GFR, Estimated: 3 mL/min — ABNORMAL LOW (ref >60)
Glucose, Bld: 113 mg/dL — ABNORMAL HIGH (ref 70–99)
Potassium: 4.3 mmol/L (ref 3.5–5.1)
Sodium: 138 mmol/L (ref 135–145)
Total Bilirubin: 0.3 mg/dL (ref 0.3–1.2)
Total Protein: 5.5 g/dL — ABNORMAL LOW (ref 6.5–8.1)

## 2021-06-02 LAB — GLUCOSE, CAPILLARY
Glucose-Capillary: 104 mg/dL — ABNORMAL HIGH (ref 70–99)
Glucose-Capillary: 105 mg/dL — ABNORMAL HIGH (ref 70–99)
Glucose-Capillary: 116 mg/dL — ABNORMAL HIGH (ref 70–99)
Glucose-Capillary: 122 mg/dL — ABNORMAL HIGH (ref 70–99)
Glucose-Capillary: 89 mg/dL (ref 70–99)

## 2021-06-02 LAB — POTASSIUM
Potassium: 4 mmol/L (ref 3.5–5.1)
Potassium: 4 mmol/L (ref 3.5–5.1)
Potassium: 4.2 mmol/L (ref 3.5–5.1)
Potassium: 4.3 mmol/L (ref 3.5–5.1)
Potassium: 4.3 mmol/L (ref 3.5–5.1)

## 2021-06-02 LAB — URINE CULTURE: Culture: NO GROWTH

## 2021-06-02 MED ORDER — ATORVASTATIN CALCIUM 80 MG PO TABS
80.0000 mg | ORAL_TABLET | Freq: Every day | ORAL | Status: DC
Start: 2021-06-02 — End: 2021-06-06
  Administered 2021-06-02 – 2021-06-06 (×5): 80 mg via ORAL
  Filled 2021-06-02 (×2): qty 1
  Filled 2021-06-02: qty 2
  Filled 2021-06-02 (×2): qty 1

## 2021-06-02 MED ORDER — WHITE PETROLATUM EX OINT
TOPICAL_OINTMENT | CUTANEOUS | Status: DC | PRN
  Filled 2021-06-02: qty 56.7

## 2021-06-02 NOTE — Progress Notes (Signed)
[facility] Progress Note  Subjective: pt seen in ICU, no new problems. 300 cc UOP yest, creat down to 13.  Vitals:   06/02/21 1130 06/02/21 1200 06/02/21 1230 06/02/21 1300  BP:  135/77  104/69  Pulse: (!) 58 60 67 63  Resp: 16 18 (!) 28 14  Temp:  98 F (36.7 C)    TempSrc:  Axillary    SpO2: 100% 100% 100% 100%  Weight:      Height:        Exam: Gen chronically ill appearing pleasant elderly frail female Sclera anicteric, throat clear  No jvd or bruits, flat NV Chest clear bilat to bases RRR no RG Abd soft ntnd no mass or ascites +bs Ext no LE or UE edema Neuro alert, Ox 3, chronic stutter and mild hand tremors         Home meds include - amiodarone, norvasc, asa, lipitor, hygroton, robaxin, lopressor, oxy IR prn, protonix, sl ntg prn, prns / vits / supps         Date                                     Creat               eGFR    2010- 2017                           0.89- 1.00    2019                                     1.22- 2.55    Dec 18- Apr 16 2021           1.91- 2.48        21-28 ml/min, CKD IV     Jun 01, 2021                        17.36, 15.65           UA 2/25 negative   UNa 73, UCr 122    CT abd 2/25 noncon - Adrenals/ Kidney: IMPRESSION: No evidence of urolithiasis or hydronephrosis. Unremarkable unopacified urinary bladder     CXR - no acute disease   Assessment/ Plan: AKI on CKD IV - b/l creat 1.9- 2.4 from dec '22- jan '23, eGFR 21- 28 ml/min.  Creat here 17 on admission today w/ greater than 1 week long hx of N/V. Suspect AKI due to vol depletion and hypoperfusion, hemodynamic vs more likely ATN. We re-bolused 2.5 L and cont'd IVF's overnight. BP's better.  UOP is not great but creat falling and pt's MS is at baseline w/o gross uremia. RRT may become necessary but no indication yet. Cont supportive care.  Hyperkalemia - K+ 6's > low 4 range. Will dc lokelma. Renal diet if possible.  Avoid kayexalate w/ recent colon perforation.  Met  acidosis - getting IV bicarb at 125 cc/ hr, continue Vol depletion - IVF"s as above SP TAA dissection repair - in Dec 2022 SP perf colon w/ partial colectomy/ ileostomy - in Dec 2022 Atrial fib - per pmd HTN - hold any BP lowering meds for now          [employer] 06/02/2021, 1:29 PM  Recent Labs  Lab 06/01/21 0958 06/01/21 1314 06/01/21 1802 06/02/21 0356 06/02/21 0714 06/02/21 1218  K 6.7* 5.9*   6.2* 4.5 4.3 4.3 4.0  BUN 137* 144*   144* 138* 123*  --   --   CREATININE 17.36* 15.35*   15.65* 14.74* 13.06*  --   --   ALBUMIN 4.6 3.8  --  2.9*  --   --   CALCIUM 9.4 8.2*   8.6* 8.7* 7.8*  --   --   PHOS  --  9.9*  --   --   --   --   HGB 11.2*  --   --  8.4*  --   --    Inpatient medications:  amiodarone  200 mg Oral Daily   atorvastatin  80 mg Oral Daily   chlorhexidine  15 mL Mouth Rinse BID   Chlorhexidine Gluconate Cloth  6 each Topical Q0600   ferrous sulfate  325 mg Oral Q breakfast   folic acid  1 mg Oral Daily   heparin  5,000 Units Subcutaneous Q8H   mouth rinse  15 mL Mouth Rinse q12n4p   metoprolol tartrate  25 mg Oral BID   multivitamin with minerals  1 tablet Oral Daily   pantoprazole  40 mg Oral Daily   sodium chloride flush  3 mL Intravenous Q12H   sodium chloride flush  3 mL Intravenous Q12H   thiamine  100 mg Oral Daily   Or   thiamine  100 mg Intravenous Daily    sodium chloride Stopped (06/02/21 0953)   sodium bicarbonate in 0.45 NS mL infusion 125 mL/hr at 06/02/21 1300   sodium chloride, acetaminophen **OR** acetaminophen, bisacodyl, diazepam, fentaNYL (SUBLIMAZE) injection, ondansetron **OR** ondansetron (ZOFRAN) IV, polyethylene glycol, sodium chloride flush, traZODone, white petrolatum
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
## 2021-06-02 NOTE — Progress Notes (Signed)
PHARMACY - PHYSICIAN COMMUNICATION CRITICAL VALUE ALERT - BLOOD CULTURE IDENTIFICATION (BCID)  Dorothy Harris is an 60 y.o. female who presented to [facility] on 06/01/2021 with a chief complaint of N/V/SOB  Assessment:  1/4 bottles GPC clusters BCID staph epi (mecA negative)- likely contaminant  Name of physician (or Provider) Contacted: Dr. Halford Chessman  Current antibiotics: None  Changes to prescribed antibiotics recommended:  No abx needed at this time  Results for orders placed or performed during the hospital encounter of 06/01/21  Blood Culture ID Panel (Reflexed) (Collected: 06/01/2021  9:51 AM)  Result Value Ref Range   Enterococcus faecalis NOT DETECTED NOT DETECTED   Enterococcus Faecium NOT DETECTED NOT DETECTED   Listeria monocytogenes NOT DETECTED NOT DETECTED   Staphylococcus species DETECTED (A) NOT DETECTED   Staphylococcus aureus (BCID) NOT DETECTED NOT DETECTED   Staphylococcus epidermidis DETECTED (A) NOT DETECTED   Staphylococcus lugdunensis NOT DETECTED NOT DETECTED   Streptococcus species NOT DETECTED NOT DETECTED   Streptococcus agalactiae NOT DETECTED NOT DETECTED   Streptococcus pneumoniae NOT DETECTED NOT DETECTED   Streptococcus pyogenes NOT DETECTED NOT DETECTED   A.calcoaceticus-baumannii NOT DETECTED NOT DETECTED   Bacteroides fragilis NOT DETECTED NOT DETECTED   Enterobacterales NOT DETECTED NOT DETECTED   Enterobacter cloacae complex NOT DETECTED NOT DETECTED   Escherichia coli NOT DETECTED NOT DETECTED   Klebsiella aerogenes NOT DETECTED NOT DETECTED   Klebsiella oxytoca NOT DETECTED NOT DETECTED   Klebsiella pneumoniae NOT DETECTED NOT DETECTED   Proteus species NOT DETECTED NOT DETECTED   Salmonella species NOT DETECTED NOT DETECTED   Serratia marcescens NOT DETECTED NOT DETECTED   Haemophilus influenzae NOT DETECTED NOT DETECTED   Neisseria meningitidis NOT DETECTED NOT DETECTED   Pseudomonas aeruginosa NOT DETECTED NOT DETECTED   Stenotrophomonas  maltophilia NOT DETECTED NOT DETECTED   Candida albicans NOT DETECTED NOT DETECTED   Candida auris NOT DETECTED NOT DETECTED   Candida glabrata NOT DETECTED NOT DETECTED   Candida krusei NOT DETECTED NOT DETECTED   Candida parapsilosis NOT DETECTED NOT DETECTED   Candida tropicalis NOT DETECTED NOT DETECTED   Cryptococcus neoformans/gattii NOT DETECTED NOT DETECTED   Methicillin resistance mecA/C NOT DETECTED NOT DETECTED    Sherlon Handing, PharmD, BCPS Please see amion for complete clinical pharmacist phone list 06/02/2021  5:40 PM

## 2021-06-03 ENCOUNTER — Inpatient Hospital Stay (HOSPITAL_COMMUNITY): Payer: PRIVATE HEALTH INSURANCE

## 2021-06-03 DIAGNOSIS — N184 Chronic kidney disease, stage 4 (severe): Secondary | ICD-10-CM | POA: Diagnosis not present

## 2021-06-03 DIAGNOSIS — E875 Hyperkalemia: Secondary | ICD-10-CM | POA: Diagnosis not present

## 2021-06-03 DIAGNOSIS — I480 Paroxysmal atrial fibrillation: Secondary | ICD-10-CM

## 2021-06-03 DIAGNOSIS — N178 Other acute kidney failure: Secondary | ICD-10-CM

## 2021-06-03 LAB — COMPREHENSIVE METABOLIC PANEL
ALT: 12 U/L (ref 0–44)
AST: 21 U/L (ref 15–41)
Albumin: 2.9 g/dL — ABNORMAL LOW (ref 3.5–5.0)
Alkaline Phosphatase: 70 U/L (ref 38–126)
Anion gap: 13 (ref 5–15)
BUN: 105 mg/dL — ABNORMAL HIGH (ref 6–20)
CO2: 20 mmol/L — ABNORMAL LOW (ref 22–32)
Calcium: 8.2 mg/dL — ABNORMAL LOW (ref 8.9–10.3)
Chloride: 105 mmol/L (ref 98–111)
Creatinine, Ser: 10.22 mg/dL — ABNORMAL HIGH (ref 0.44–1.00)
GFR, Estimated: 4 mL/min — ABNORMAL LOW (ref >60)
Glucose, Bld: 139 mg/dL — ABNORMAL HIGH (ref 70–99)
Potassium: 3.6 mmol/L (ref 3.5–5.1)
Sodium: 138 mmol/L (ref 135–145)
Total Bilirubin: 0.3 mg/dL (ref 0.3–1.2)
Total Protein: 5.7 g/dL — ABNORMAL LOW (ref 6.5–8.1)

## 2021-06-03 LAB — CBC
HCT: 27 % — ABNORMAL LOW (ref 36.0–46.0)
Hemoglobin: 8.6 g/dL — ABNORMAL LOW (ref 12.0–15.0)
MCH: 30.5 pg (ref 26.0–34.0)
MCHC: 31.9 g/dL (ref 30.0–36.0)
MCV: 95.7 fL (ref 80.0–100.0)
Platelets: 235 K/uL (ref 150–400)
RBC: 2.82 MIL/uL — ABNORMAL LOW (ref 3.87–5.11)
RDW: 15.7 % — ABNORMAL HIGH (ref 11.5–15.5)
WBC: 8.6 K/uL (ref 4.0–10.5)
nRBC: 0 % (ref 0.0–0.2)

## 2021-06-03 LAB — POTASSIUM
Potassium: 3.9 mmol/L (ref 3.5–5.1)
Potassium: 3.8 mmol/L (ref 3.5–5.1)

## 2021-06-03 LAB — GLUCOSE, CAPILLARY
Glucose-Capillary: 97 mg/dL (ref 70–99)
Glucose-Capillary: 143 mg/dL — ABNORMAL HIGH (ref 70–99)
Glucose-Capillary: 158 mg/dL — ABNORMAL HIGH (ref 70–99)
Glucose-Capillary: 164 mg/dL — ABNORMAL HIGH (ref 70–99)

## 2021-06-03 NOTE — Progress Notes (Signed)
[facility] Progress Note  Subjective:  Out of ICU now 5W Family at bedside SCr down to 10.0 from 13 yest, K 3.8 At least 1L UOP To take PO today, feels improved, conversant AF, VSS, on RA   Vitals:   06/03/21 0304 06/03/21 0500 06/03/21 0827 06/03/21 0855  BP: 131/71  120/71 120/71  Pulse: 70  64 68  Resp: 16  17   Temp: 98.2 F (36.8 C)  98.5 F (36.9 C)   TempSrc: Oral  Oral   SpO2: 95%  96%   Weight:  85.3 kg    Height:        Exam: Gen chronically ill appearing pleasant elderly frail female Sclera anicteric, throat clear  No jvd or bruits, flat NV Chest clear bilat to bases RRR no RG Abd soft ntnd no mass or ascites +bs Ext no LE or UE edema Neuro alert, Ox 3, chronic stutter and mild hand tremors       Home meds include - amiodarone, norvasc, asa, lipitor, hygroton, robaxin, lopressor, oxy IR prn, protonix, sl ntg prn, prns / vits / supps         Date                                     Creat               eGFR    2010- 2017                           0.89- 1.00    2019                                     1.22- 2.55    Dec 18- Apr 16 2021           1.91- 2.48        21-28 ml/min, CKD IV     Jun 01, 2021                        17.36, 15.65           UA 2/25 negative   UNa 73, UCr 122    CT abd 2/25 noncon - Adrenals/ Kidney: IMPRESSION: No evidence of urolithiasis or hydronephrosis. Unremarkable unopacified urinary bladder     CXR - no acute disease   Assessment/ Plan: AKI on CKD IV - b/l creat 1.9- 2.4 from dec '22- jan '23, eGFR 21- 28 ml/min.  Creat here 17 on admission today w/ greater than 1 week long hx of N/V. Suspect AKI due to vol depletion and hypoperfusion, hemodynamic vs more likely ATN. Has improved with volume repletion and supportive c are.  No indication for RRT. Hyperkalemia - Resolved. Will dc lokelma. Renal diet if possible.   Met acidosis - getting IV bicarb at 75 cc/ hr, continue until eating/drinking consistently Vol  depletion - IVF"s as above SP TAA dissection repair - in Dec 2022 SP perf colon w/ partial colectomy/ ileostomy - in Dec 2022 Atrial fib - per pmd HTN - BPs stable   Arita Miss, MD  06/03/2021, 11:34 AM   Recent Labs  Lab 06/01/21 1314 06/01/21 1802 06/02/21 0356 06/02/21 0714 06/03/21 0355 06/03/21 0805  K 5.9*  6.2*   < > 4.3   < > 3.6 3.8  BUN 144*   144*   < > 123*  --  105*  --   CREATININE 15.35*   15.65*   < > 13.06*  --  10.22*  --   ALBUMIN 3.8  --  2.9*  --  2.9*  --   CALCIUM 8.2*   8.6*   < > 7.8*  --  8.2*  --   PHOS 9.9*  --   --   --   --   --   HGB  --   --  8.4*  --  8.6*  --    < > = values in this interval not displayed.    Inpatient medications:  amiodarone  200 mg Oral Daily   atorvastatin  80 mg Oral Daily   chlorhexidine  15 mL Mouth Rinse BID   Chlorhexidine Gluconate Cloth  6 each Topical Q0600   ferrous sulfate  325 mg Oral Q breakfast   folic acid  1 mg Oral Daily   heparin  5,000 Units Subcutaneous Q8H   mouth rinse  15 mL Mouth Rinse q12n4p   metoprolol tartrate  25 mg Oral BID   multivitamin with minerals  1 tablet Oral Daily   pantoprazole  40 mg Oral Daily   sodium chloride flush  3 mL Intravenous Q12H   sodium chloride flush  3 mL Intravenous Q12H   thiamine  100 mg Oral Daily    sodium chloride Stopped (06/02/21 0953)   sodium bicarbonate in 0.45 NS mL infusion 100 mL/hr at 06/03/21 0124   sodium chloride, acetaminophen **OR** acetaminophen, bisacodyl, diazepam, fentaNYL (SUBLIMAZE) injection, ondansetron **OR** ondansetron (ZOFRAN) IV, polyethylene glycol, sodium chloride flush, traZODone, white petrolatum

## 2021-06-03 NOTE — Progress Notes (Addendum)
PROGRESS NOTE    Dorothy Harris  DPO:[id number] DOB: [birth year] DOA: 06/01/2021 PCP: Pcp, No   Chief Complaint  Patient presents with   Emesis    Brief Narrative:   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   Ms. [name] states that she has been feeling ill for approximately 1 week now.  Symptoms initially started with nausea and vomiting that progressed to shortness of breath with exertion and at rest.  Emesis has been nonbloody, nonbilious.  She denies any fever but endorses chills beginning today.  She endorses some short-term memory loss that seems to have begun today as well as generalized weakness.  She denies any abdominal pain, chest pain, palpitations, diarrhea, constipation.  She denies any metallic taste in her mouth. Ms. [name] states she has not had any alcoholic beverage since December 2022 nor tobacco use since then either.   On arrival to ED, BMP showed significantly elevated potassium at 6.7, creatinine of 17, and bicarb of 10.  Lipase elevated at 607. No signs of necrosis on CT. Patient was started on insulin with D50, bicarb, calcium gluconate. Transferred to [facility] for potential CRRT.   Assessment & Plan:   Principal Problem:   Acute hyperkalemia Active Problems:   S/P ascending aortic replacement   AKI (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   PAF (paroxysmal atrial fibrillation) (HCC)   Pressure injury of skin   Acute Severe Hyperkalemia  - Initially 6.7 on admission with evidence of peaked T waves on EKG. Received insulin + dextrose x 2, Lasix with no urine output, several amps of sodium bicarb and Lokelma. Improved to 6.2 > 5.9.  -Renal input greatly appreciated, so far no need for dialysis. -Lokelma has been discontinued.   Anion-Gap Metabolic  Acidosis -Proving, continue with bicarb drip for now oral intake is more reliable.     Acute Kidney Injury ON CKD Stage 4 - Pre-renal in the setting of dehydration. Some signs of uremia on examination including subjective confusion, asterixis, worsening in stutter. Hyperkalemia and acidosis noted as above.   - Nephrology consulted; appreciate their recommendations - Continue aggressive IVF resuscitation  - Insert foley cathter - Strict UOP - Renal ultrasound pending    Acute Pancreatitis  History of AUD  - Lipase elevated to 604 with no signs of necrosis or inflammation on CT. -She reports last alcoholic beverage on 43/15/4008 - No evidence of gallstones or biliary duct dilatation on CT to suggest gallstone pancreatitis.  -He denies any nausea or vomiting, appetite is improving, asking to advance her diet, will advance to soft diet today.   Demand Ischemia  - Mild troponin leak. No ischemic changes on EKG per Cardiology.    History of Hypertension  - Holding home Amlodipine and Chlorthalidone    History of A. Fib, paroxysmal  - Continue home Amiodarone, Metoprolol   History of HFpEF - Hypovolemic on examination. 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  Objective: Vitals:   06/03/21 0500 06/03/21 0827 06/03/21 0855 06/03/21 1152  BP:  120/71 120/71 120/71  Pulse:  64 68 70  Resp:  17  20  Temp:  98.5 F (36.9 C)  98.2 F (36.8 C)  TempSrc:  Oral  Oral  SpO2:  96%  97%  Weight: 85.3 kg     Height:        Intake/Output Summary (Last 24 hours) at 06/03/2021 1511 Last data filed at 06/03/2021 1253 Gross per 24 hour  Intake 892.1 ml  Output 2125 ml  Net -1232.9 ml   Filed Weights   06/01/21 1615 06/03/21 0009 06/03/21 0500  Weight: 80.4 kg 85.8 kg 85.3 kg    Examination:  Awake Alert, Oriented X 3, No new F.N deficits, Normal affect Symmetrical Chest wall movement, Good air movement bilaterally, CTAB RRR,No Gallops,Rubs or new Murmurs, No Parasternal Heave +ve B.Sounds, abdomen nontender, midline surgical wound bandaged, right colostomy. No Cyanosis, Clubbing or edema, left foot tender to palpation but no edema, warmth  or erythema noted     Data Reviewed: I have personally reviewed following labs and imaging studies  CBC: Recent Labs  Lab 06/01/21 0958 06/02/21 0356 06/03/21 0355  WBC 8.5 6.5 8.6  HGB 11.2* 8.4* 8.6*  HCT 36.4 26.6* 27.0*  MCV 101.4* 98.5 95.7  PLT 328 218 144    Basic Metabolic Panel: Recent Labs  Lab 06/01/21 0958 06/01/21 1314 06/01/21 1802 06/02/21 0016 06/02/21 0356 06/02/21 0714 06/02/21 1546 06/02/21 2021 06/03/21 0036 06/03/21 0355 06/03/21 0805  NA 134* 133*   138 137  --  138  --   --   --   --  138  --   K 6.7* 5.9*   6.2* 4.5   < > 4.3   < > 4.2 4.0 3.9 3.6 3.8  CL 102 107   107 106  --  109  --   --   --   --  105  --   CO2 10* 12*   13* 12*  --  14*  --   --   --   --  20*  --   GLUCOSE 154* 161*   163* 187*  --  113*  --   --   --   --  139*  --   BUN 137* 144*   144* 138*  --  123*  --   --   --   --  105*  --   CREATININE 17.36* 15.35*   15.65* 14.74*  --  13.06*  --   --   --   --  10.22*  --   CALCIUM 9.4 8.2*   8.6* 8.7*  --  7.8*  --   --   --   --   8.2*  --   PHOS  --  9.9*  --   --   --   --   --   --   --   --   --    < > = values in this interval not displayed.    GFR: Estimated Creatinine Clearance:  6.6 mL/min (A) (by C-G formula based on SCr of 10.22 mg/dL (H)).  Liver Function Tests: Recent Labs  Lab 06/01/21 0958 06/01/21 1314 06/02/21 0356 06/03/21 0355  AST 18  --  17 21  ALT 11  --  10 12  ALKPHOS 107  --  68 70  BILITOT 0.9  --  0.3 0.3  PROT 8.7*  --  5.5* 5.7*  ALBUMIN 4.6 3.8 2.9* 2.9*    CBG: Recent Labs  Lab 06/02/21 1153 06/02/21 1928 06/02/21 2330 06/03/21 0827 06/03/21 1153  GLUCAP 122* 104* 89 97 143*     Recent Results (from the past 240 hour(s))  Blood culture (routine x 2)     Status: Abnormal (Preliminary result)   Collection Time: 06/01/21  9:51 AM   Specimen: Left Antecubital; Blood  Result Value Ref Range Status   Specimen Description   Final    LEFT ANTECUBITAL BOTTLES DRAWN AEROBIC AND ANAEROBIC Performed at [facility], [address], [place], [place] [address]    Special Requests   Final    Blood Culture results may not be optimal due to an excessive volume of blood received in culture bottles Performed at [facility], [address], [place], [place] [address]    Culture  Setup Time   Final    GRAM POSITIVE COCCI AEROBIC BOTTLE ONLY Gram Stain Report Called to,Read Back By and Verified With: M. WOOD @ [phone] 06/02/21 BY STEPHTR CRITICAL RESULT CALLED TO, READ BACK BY AND VERIFIED WITH: K,AMEND PHARMD $RemoveBefore'@1722'[id number]$  06/02/21 EB    Culture (A)  Final    STAPHYLOCOCCUS EPIDERMIDIS THE SIGNIFICANCE OF ISOLATING THIS ORGANISM FROM A SINGLE SET OF BLOOD CULTURES WHEN MULTIPLE SETS ARE DRAWN IS UNCERTAIN. PLEASE NOTIFY THE MICROBIOLOGY DEPARTMENT WITHIN ONE WEEK IF SPECIATION AND SENSITIVITIES ARE REQUIRED. Performed at [facility], [place] [address] [place], [place] [address]    Report Status PENDING  Incomplete  Blood Culture ID Panel (Reflexed)     Status: Abnormal   Collection  Time: 06/01/21  9:51 AM  Result Value Ref Range Status   Enterococcus faecalis NOT DETECTED NOT DETECTED Final   Enterococcus Faecium NOT DETECTED NOT DETECTED Final   Listeria monocytogenes NOT DETECTED NOT DETECTED Final   Staphylococcus species DETECTED (A) NOT DETECTED Final    Comment: CRITICAL RESULT CALLED TO, READ BACK BY AND VERIFIED WITH: K,AMEND PHARMD $RemoveBefore'@1722'[id number]$  06/02/21 EB    Staphylococcus aureus (BCID) NOT DETECTED NOT DETECTED Final   Staphylococcus epidermidis DETECTED (A) NOT DETECTED Final    Comment: CRITICAL RESULT CALLED TO, READ BACK BY AND VERIFIED WITH: K,AMEND PHARMD $RemoveBefore'@1722'[id number]$  06/02/21 EB    Staphylococcus lugdunensis NOT DETECTED NOT DETECTED Final   Streptococcus species NOT DETECTED NOT DETECTED Final   Streptococcus agalactiae NOT DETECTED NOT DETECTED Final   Streptococcus pneumoniae NOT DETECTED NOT DETECTED Final   Streptococcus pyogenes NOT DETECTED NOT DETECTED Final   A.calcoaceticus-baumannii NOT DETECTED NOT DETECTED Final   Bacteroides fragilis NOT DETECTED NOT DETECTED Final   Enterobacterales NOT DETECTED NOT DETECTED Final   Enterobacter cloacae complex NOT DETECTED NOT DETECTED Final   Escherichia coli NOT DETECTED NOT DETECTED Final   Klebsiella aerogenes NOT DETECTED NOT DETECTED Final   Klebsiella oxytoca NOT DETECTED NOT DETECTED Final   Klebsiella pneumoniae NOT DETECTED NOT DETECTED Final   Proteus species NOT DETECTED NOT DETECTED Final   Salmonella species NOT DETECTED NOT DETECTED Final   Serratia marcescens NOT DETECTED NOT DETECTED Final   Haemophilus influenzae  NOT DETECTED NOT DETECTED Final   Neisseria meningitidis NOT DETECTED NOT DETECTED Final   Pseudomonas aeruginosa NOT DETECTED NOT DETECTED Final   Stenotrophomonas maltophilia NOT DETECTED NOT DETECTED Final   Candida albicans NOT DETECTED NOT DETECTED Final   Candida auris NOT DETECTED NOT DETECTED Final   Candida glabrata NOT DETECTED NOT DETECTED Final   Candida krusei NOT  DETECTED NOT DETECTED Final   Candida parapsilosis NOT DETECTED NOT DETECTED Final   Candida tropicalis NOT DETECTED NOT DETECTED Final   Cryptococcus neoformans/gattii NOT DETECTED NOT DETECTED Final   Methicillin resistance mecA/C NOT DETECTED NOT DETECTED Final    Comment: Performed at [facility], [place] [address], [place], [place] [address]  Blood culture (routine x 2)     Status: None (Preliminary result)   Collection Time: 06/01/21  9:56 AM   Specimen: Left Antecubital; Blood  Result Value Ref Range Status   Specimen Description   Final    LEFT ANTECUBITAL BOTTLES DRAWN AEROBIC AND ANAEROBIC   Special Requests Blood Culture adequate volume  Final   Culture   Final    NO GROWTH 2 DAYS Performed at [facility], [address], [place], [place] [address]    Report Status PENDING  Incomplete  Resp Panel by RT-PCR (Flu A&B, Covid) Nasopharyngeal Swab     Status: None   Collection Time: 06/01/21 10:03 AM   Specimen: Nasopharyngeal Swab; Nasopharyngeal(NP) swabs in vial transport medium  Result Value Ref Range Status   SARS Coronavirus 2 by RT PCR NEGATIVE NEGATIVE Final    Comment: (NOTE) SARS-CoV-2 target nucleic acids are NOT DETECTED.  The SARS-CoV-2 RNA is generally detectable in upper respiratory specimens during the acute phase of infection. The lowest concentration of SARS-CoV-2 viral copies this assay can detect is 138 copies/mL. A negative result does not preclude SARS-Cov-2 infection and should not be used as the sole basis for treatment or other patient management decisions. A negative result may occur with  improper specimen collection/handling, submission of specimen other than nasopharyngeal swab, presence of viral mutation(s) within the areas targeted by this assay, and inadequate number of viral copies(<138 copies/mL). A negative result must be combined with clinical observations, patient history, and epidemiological information. The expected result is  Negative.  Fact Sheet for Patients:  EntrepreneurPulse.com.au  Fact Sheet for Healthcare Providers:  IncredibleEmployment.be  This test is no t yet approved or cleared by the [place] FDA and  has been authorized for detection and/or diagnosis of SARS-CoV-2 by FDA under an Emergency Use Authorization (EUA). This EUA will remain  in effect (meaning this test can be used) for the duration of the COVID-19 declaration under Section 564(b)(1) of the Act, 21 U.S.C.section 360bbb-3(b)(1), unless the authorization is terminated  or revoked sooner.       Influenza A by PCR NEGATIVE NEGATIVE Final   Influenza B by PCR NEGATIVE NEGATIVE Final    Comment: (NOTE) The Xpert Xpress SARS-CoV-2/FLU/RSV plus assay is intended as an aid in the diagnosis of influenza from Nasopharyngeal swab specimens and should not be used as a sole basis for treatment. Nasal washings and aspirates are unacceptable for Xpert Xpress SARS-CoV-2/FLU/RSV testing.  Fact Sheet for Patients: EntrepreneurPulse.com.au  Fact Sheet for Healthcare Providers: IncredibleEmployment.be  This test is not yet approved or cleared by the [place] FDA and has been authorized for detection and/or diagnosis of SARS-CoV-2 by FDA under an Emergency Use Authorization (EUA). This EUA will remain in effect (meaning this test can  be used) for the duration of the COVID-19 declaration under Section 564(b)(1) of the Act, 21 U.S.C. section 360bbb-3(b)(1), unless the authorization is terminated or revoked.  Performed at [facility], [address], [place], [place] [address]   MRSA Next Gen by PCR, Nasal     Status: None   Collection Time: 06/01/21  4:51 PM   Specimen: Nasal Mucosa; Nasal Swab  Result Value Ref Range Status   MRSA by PCR Next Gen NOT DETECTED NOT DETECTED Final    Comment: (NOTE) The GeneXpert MRSA Assay (FDA approved for NASAL specimens  only), is one component of a comprehensive MRSA colonization surveillance program. It is not intended to diagnose MRSA infection nor to guide or monitor treatment for MRSA infections. Test performance is not FDA approved in patients less than 23 years old. Performed at [facility], [address], [place], [place] [address]   Urine Culture     Status: None   Collection Time: 06/01/21  6:44 PM   Specimen: In/Out Cath Urine  Result Value Ref Range Status   Specimen Description IN/OUT CATH URINE  Final   Special Requests NONE  Final   Culture   Final    NO GROWTH Performed at [facility], [address] [place], [place] [address]    Report Status 06/02/2021 FINAL  Final         Radiology Studies: US RENAL  Result Date: 06/01/2021 CLINICAL DATA:  Acute renal failure superimposed on chronic kidney disease stage 4. EXAM: RENAL / URINARY TRACT ULTRASOUND COMPLETE COMPARISON:  Noncontrast abdominopelvic CT earlier today. FINDINGS: Right Kidney: Renal measurements: 11.2 x 5.6 x 5.6 cm = volume: 182 mL. Mild increased renal parenchymal echogenicity. No hydronephrosis. No visualized stone or focal lesion. Left Kidney: Renal measurements: 10.3 x 5.5 x 5.4 cm = volume: 159 mL. Mild increased renal parenchymal echogenicity. Subcentimeter cyst in the lower pole. No hydronephrosis. No stone or solid lesion. Bladder: Distended.  Appears normal for degree of bladder distention. Other: None. IMPRESSION: 1. No obstructive uropathy. 2. Mild increased renal parenchymal echogenicity typical of chronic medical renal disease. Electronically Signed   By: Narda Rutherford M.D.   On: 06/01/2021 18:47   DG Foot 2 Views Left  Result Date: 06/03/2021 CLINICAL DATA:  Left foot pain. EXAM: LEFT FOOT - 2 VIEW COMPARISON:  None. FINDINGS: Old fifth metatarsal fracture is noted. Mild hallux valgus deformity of first metatarsophalangeal joint is noted. No acute fracture or dislocation is noted. No soft  tissue abnormality is noted. IMPRESSION: Chronic findings as described above.  No acute abnormality seen. Electronically Signed   By: Lupita Raider M.D.   On: 06/03/2021 11:49   US Abdomen Limited RUQ (LIVER/GB)  Result Date: 06/01/2021 CLINICAL DATA:  Pancreatitis EXAM: ULTRASOUND ABDOMEN LIMITED RIGHT UPPER QUADRANT COMPARISON:  CT earlier today FINDINGS: Gallbladder: No gallstones or wall thickening visualized. No sonographic Murphy sign noted by sonographer. Common bile duct: Diameter: Normal caliber, 5 mm Liver: 4.5 cm cyst in the caudate lobe as seen on CT. Normal echotexture. No biliary ductal dilatation. Portal vein is patent on color Doppler imaging with normal direction of blood flow towards the liver. Other: None. IMPRESSION: 4.5 cm cyst in the caudate lobe. No acute findings. Electronically Signed   By: Charlett Nose M.D.   On: 06/01/2021 19:22        Scheduled Meds:  amiodarone  200 mg Oral Daily   atorvastatin  80 mg Oral Daily  chlorhexidine  15 mL Mouth Rinse BID   Chlorhexidine Gluconate Cloth  6 each Topical Q0600   ferrous sulfate  325 mg Oral Q breakfast   folic acid  1 mg Oral Daily   heparin  5,000 Units Subcutaneous Q8H   mouth rinse  15 mL Mouth Rinse q12n4p   metoprolol tartrate  25 mg Oral BID   multivitamin with minerals  1 tablet Oral Daily   pantoprazole  40 mg Oral Daily   sodium chloride flush  3 mL Intravenous Q12H   sodium chloride flush  3 mL Intravenous Q12H   thiamine  100 mg Oral Daily   Continuous Infusions:  sodium chloride Stopped (06/02/21 0953)   sodium bicarbonate in 0.45 NS mL infusion 100 mL/hr at 06/03/21 1252     LOS: 2 days        Phillips Climes, MD [facility]   To contact the attending provider between 7A-7P or the covering provider during after hours 7P-7A, please log into the web site [email or web address] and access using universal [place] password for that web site. If you do not have the password, please call the  hospital operator.  06/03/2021, 3:11 PM

## 2021-06-03 NOTE — Evaluation (Signed)
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 Hyperkalemia, acute on chronic kidney disease, acute pancreatitis;  Clinical Impression   Pt admitted secondary to problem above with deficits below. PTA patient was living alone with friend checking on her daily and sister assisting with getting groceries. She reports she went to rehab for 7 days after hospitalization and then went home.  Pt currently requires minguard assist for transfers and gait with RW (mostly due to pt's fear of falling).  Anticipate patient will benefit from PT to address problems listed below.Will continue to follow acutely to maximize functional mobility independence and safety.          Recommendations for follow up therapy are one component of a multi-disciplinary discharge planning process, led by the attending physician.  Recommendations may be updated based on patient status, additional functional criteria and insurance authorization.  Follow Up Recommendations No PT follow up    Assistance Recommended at Discharge PRN  Patient can return home with the following  Assistance with cooking/housework;Help with stairs or ramp for entrance    Equipment Recommendations None recommended by PT  Recommendations for Other Services  OT consult    Functional Status Assessment Patient has had a recent decline in their functional status and demonstrates the ability to make significant improvements in function in a reasonable and predictable amount of time.     Precautions / Restrictions Precautions Precautions: Fall Restrictions Weight Bearing Restrictions: No       Mobility  Bed Mobility Overal bed mobility: Modified Independent             General bed mobility comments: HOB ~20    Transfers Overall transfer level: Needs assistance Equipment used: Rolling walker (2 wheels) Transfers: Sit to/from Stand Sit to Stand: Min guard           General transfer comment: vc for hand placement    Ambulation/Gait Ambulation/Gait assistance: Min guard Gait Distance (Feet): 30 Feet Assistive device: Rolling walker (2 wheels) Gait Pattern/deviations: Step-through pattern, Trunk flexed Gait velocity: decr Gait velocity interpretation: 1.31 - 2.62 ft/sec, indicative of limited community ambulator   General Gait Details: pt pushes RW a bit too far forward, causing her to flex trunk; pt felt she could only walk a short distance because this is all she does at home  Stairs            Wheelchair Mobility    Modified Rankin (Stroke Patients Only)       Balance Overall balance assessment: Mild deficits observed, not formally tested                                           Pertinent Vitals/Pain Pain Assessment Pain Assessment: No/denies pain    Home Living Family/patient expects to be discharged to:: Private residence Living Arrangements: Alone Available Help at Discharge: Friend(s);Available PRN/intermittently;Family Type of Home: House Home Access: Stairs to enter Entrance Stairs-Rails: Left Entrance Stairs-Number of Steps: 3   Home Layout: One level Home Equipment: Conservation officer, nature (2 wheels);Shower seat      Prior Function Prior Level of  Function : Needs assist       Physical Assist : ADLs (physical)   ADLs (physical): IADLs Mobility Comments: using RW inside home since recent discharge from hospital; reports only walks short distances due to fear of falling ADLs Comments: due to abd wound, does not get in shower--washes up at sink; assist with groceries     Hand Dominance   Dominant Hand:  Right    Extremity/Trunk Assessment   Upper Extremity Assessment Upper Extremity Assessment: Generalized weakness    Lower Extremity Assessment Lower Extremity Assessment: Generalized weakness    Cervical / Trunk Assessment Cervical / Trunk Assessment: Normal  Communication   Communication: No difficulties  Cognition Arousal/Alertness: Awake/alert Behavior During Therapy: WFL for tasks assessed/performed Overall Cognitive Status: Within Functional Limits for tasks assessed                                          General Comments General comments (skin integrity, edema, etc.): pt reports sister came and stayed with her x 2 days when she went home from rehab; also has friend who comes to check on her every day    Exercises     Assessment/Plan    PT Assessment Patient needs continued PT services  PT Problem List Decreased strength;Decreased balance;Decreased mobility;Decreased knowledge of use of DME       PT Treatment Interventions DME instruction;Gait training;Stair training;Functional mobility training;Therapeutic activities;Therapeutic exercise;Balance training;Patient/family education    PT Goals (Current goals can be found in the Care Plan section)  Acute Rehab PT Goals Patient Stated Goal: go straight home without need for rehab prior PT Goal Formulation: With patient Time For Goal Achievement: 06/17/21 Potential to Achieve Goals: Good    Frequency Min 3X/week     Co-evaluation               AM-PAC PT "6 Clicks" Mobility  Outcome Measure Help needed turning from your back to your side while in a flat bed without using bedrails?: None Help needed moving from lying on your back to sitting on the side of a flat bed without using bedrails?: A Little Help needed moving to and from a bed to a chair (including a wheelchair)?: A Little Help needed standing up from a chair using your arms (e.g., wheelchair or bedside chair)?: A Little Help  needed to walk in hospital room?: A Little Help needed climbing 3-5 steps with a railing? : A Little 6 Click Score: 19    End of Session Equipment Utilized During Treatment: Gait belt Activity Tolerance: Patient tolerated treatment well Patient left: in bed;with call bell/phone within reach;with bed alarm set;with family/visitor present Nurse Communication: Mobility status;Other (comment) (?IV site bleeding) PT Visit Diagnosis: Other abnormalities of gait and mobility (R26.89);Muscle weakness (generalized) (M62.81)    Time: 1610-9604 PT Time Calculation (min) (ACUTE ONLY): 26 min   Charges:   PT Evaluation $PT Eval Low Complexity: 1 Low PT Treatments $Gait Training: 8-22 mins         Arby Barrette, PT Acute Rehabilitation Services  Pager [phone] Office [phone]   Rexanne Mano 06/03/2021, 3:04 PM

## 2021-06-03 NOTE — Plan of Care (Signed)
°  Problem: Education: Goal: Knowledge of General Education information will improve Description: Including pain rating scale, medication(s)/side effects and non-pharmacologic comfort measures Outcome: Progressing   Problem: Health Behavior/Discharge Planning: Goal: Ability to manage health-related needs will improve Outcome: Progressing   Problem: Clinical Measurements: Goal: Ability to maintain clinical measurements within normal limits will improve Outcome: Progressing Goal: Will remain free from infection Outcome: Progressing Goal: Diagnostic test results will improve Outcome: Progressing Goal: Respiratory complications will improve Outcome: Progressing Goal: Cardiovascular complication will be avoided Outcome: Progressing   Problem: Nutrition: Goal: Adequate nutrition will be maintained Outcome: Progressing   Problem: Coping: Goal: Level of anxiety will decrease Outcome: Progressing   Problem: Elimination: Goal: Will not experience complications related to bowel motility Outcome: Progressing Goal: Will not experience complications related to urinary retention Outcome: Progressing   Problem: Pain Managment: Goal: General experience of comfort will improve Outcome: Progressing   Problem: Safety: Goal: Ability to remain free from injury will improve Outcome: Progressing   Problem: Skin Integrity: Goal: Risk for impaired skin integrity will decrease Outcome: Progressing   Problem: Activity: Goal: Risk for activity intolerance will decrease Outcome: Not Progressing

## 2021-06-03 NOTE — Plan of Care (Signed)

## 2021-06-03 NOTE — TOC Initial Note (Addendum)
Transition of Care [facility]) - Initial/Assessment Note    Patient Details  Name: Dorothy Harris MRN: [id number] Date of Birth: Feb 02, 1961  Transition of [facility]) CM/SW Contact:    Cyndi Bender, RN Phone Number: 06/03/2021, 10:27 AM  Clinical Narrative:             Spoke to patient regarding transition needs. She lives alone in a house. Her friend takes her to appointments. Patient has an apt with a PCP but doesn't have the PCP name with her. She has a walker. She can afford her medications but it is difficult. She has family support at the bedside. TOC will continue to follow patient and look for therapy recommendations. Patient states that her insurance doesn't cover Home health therapy or outpatient therapy.    [place] [facility] called to get Therapy benefits. Patient has a high deductible of [phone]. Once the deductible is met outpatient therapy would have zero copay if she goes to one in network. [facility] is in-network. [facility] is out of network.   Expected Discharge Plan: [place] Barriers to Discharge: Continued Medical Work up   Patient Goals and CMS Choice Patient states their goals for this hospitalization and ongoing recovery are:: return home      Expected Discharge Plan and Services Expected Discharge Plan: [place]   Discharge Planning Services: CM Consult   Living arrangements for the past 2 months: Single Family Home                                      Prior Living Arrangements/Services Living arrangements for the past 2 months: Single Family Home Lives with:: Self Patient language and need for interpreter reviewed:: Yes Do you feel safe going back to the place where you live?: Yes      Need for Family Participation in Patient Care: Yes (Comment) Care giver support system in place?: Yes (comment) Current home services: DME (walker) Criminal Activity/Legal Involvement Pertinent  to Current Situation/Hospitalization: No - Comment as needed  Activities of Daily Living      Permission Sought/Granted                  Emotional Assessment Appearance:: Appears older than stated age Attitude/Demeanor/Rapport: Engaged Affect (typically observed): Accepting Orientation: : Oriented to Self, Oriented to Situation, Oriented to  Time, Oriented to Place Alcohol / Substance Use: Alcohol Use (last drink 04/2021) Psych Involvement: No (comment)  Admission diagnosis:  Acute hyperkalemia [E87.5] Acute renal failure with tubular necrosis (HCC) [N17.0] Sepsis ([place]) [A41.9] Acute renal failure superimposed on stage 4 chronic kidney disease ([place]) [N17.9, N18.4] Idiopathic acute pancreatitis without infection or necrosis [K85.00] Patient Active Problem List   Diagnosis Date Noted   Pressure injury of skin 06/02/2021   Hyperkalemia 06/01/2021   Acute hyperkalemia 06/01/2021   PAF (paroxysmal atrial fibrillation) ([place]) 05/07/2021   Essential hypertension 05/07/2021   Pure hypercholesterolemia 05/07/2021   Debility 04/15/2021   Acute hypoxemic respiratory failure ([place]) 04/07/2021   Malnutrition of moderate degree 04/03/2021   Encounter for postanesthesia care 04/02/2021   Peritonitis ([place]) 04/02/2021   Endotracheally intubated 04/02/2021   AKI (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 04/02/2021   S/P ascending aortic replacement 03/25/2021   Encounter for weaning from ventilator [facility])    Status post surgery 03/24/2021   Tobacco abuse 07/30/2017   History  of drug use 07/30/2017   Influenza, pneumonia    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   Chest pain 05/31/2017   Coronary artery disease due to lipid rich plaque    Acute on chronic diastolic CHF (congestive heart failure) ([place])    Acute coronary syndrome ([place]) 12/23/2015   NSTEMI (non-ST elevated myocardial infarction) ([place]) 12/22/2015   PCP:  Pcp,  No Pharmacy:   [facility], [address] [place] [address] Phone: [phone] Fax: [phone]     Social Determinants of Health ([place]) Interventions    Readmission Risk Interventions Readmission Risk Prevention Plan 04/15/2021  Transportation Screening (No Data)  PCP or Specialist Appt within 5-7 Days (No Data)  Home Care Screening Complete  Medication Review (RN CM) Complete  Some recent data might be hidden

## 2021-06-03 NOTE — Consult Note (Signed)
[facility] Nurse Consult Note: Patient receiving care in [place] Reason for Consult: wound Wound type: Surgical mid abdominal wound Pressure Injury POA: NA Wound bed: 95% pink granulation tissue 10% yellow tissue Drainage (amount, consistency, odor) Moderate serosanguinous Periwound: intact Dressing procedure/placement/frequency: Clean the abdominal wound with NS, then place a moistened saline gauze into the wound, cover with dry gauze and ABD pad, secure with Medipore tape. Change twice daily  Monitor the wound area(s) for worsening of condition such as: Signs/symptoms of infection, increase in size, development of or worsening of odor, development of pain, or increased pain at the affected locations.   Notify the medical team if any of these develop.  [place] Nurse ostomy consult note Stoma type/location: RLQ ileostomy Stomal assessment/size: 1 1/2" pink moist Peristomal assessment: intact Treatment options for stomal/peristomal skin: barrier ring Output: thin dark green Ostomy pouching: 2pc. 2 1/4"  Education provided: Patient states she is doing very well with changing her pouch and that she has been to the ostomy clinic twice.  Enrolled patient in [place] program: Yes previously  Thank you for the consult. [place] nurse will not follow at this time.   Please re-consult the [place] team if needed.  Cathlean Marseilles Tamala Julian, MSN, RN, [place], [place] [place], [facility] Wound Treatment Associate Pager [phone]

## 2021-06-04 DIAGNOSIS — N179 Acute kidney failure, unspecified: Secondary | ICD-10-CM

## 2021-06-04 LAB — CBC
HCT: 25 % — ABNORMAL LOW (ref 36.0–46.0)
Hemoglobin: 8.1 g/dL — ABNORMAL LOW (ref 12.0–15.0)
MCH: 30.8 pg (ref 26.0–34.0)
MCHC: 32.4 g/dL (ref 30.0–36.0)
MCV: 95.1 fL (ref 80.0–100.0)
Platelets: 229 10*3/uL (ref 150–400)
RBC: 2.63 MIL/uL — ABNORMAL LOW (ref 3.87–5.11)
RDW: 15.5 % (ref 11.5–15.5)
WBC: 8.1 10*3/uL (ref 4.0–10.5)
nRBC: 0 % (ref 0.0–0.2)

## 2021-06-04 LAB — COMPREHENSIVE METABOLIC PANEL
ALT: 15 U/L (ref 0–44)
AST: 22 U/L (ref 15–41)
Albumin: 2.7 g/dL — ABNORMAL LOW (ref 3.5–5.0)
Alkaline Phosphatase: 67 U/L (ref 38–126)
Anion gap: 12 (ref 5–15)
BUN: 86 mg/dL — ABNORMAL HIGH (ref 6–20)
CO2: 22 mmol/L (ref 22–32)
Calcium: 7.9 mg/dL — ABNORMAL LOW (ref 8.9–10.3)
Chloride: 105 mmol/L (ref 98–111)
Creatinine, Ser: 7.68 mg/dL — ABNORMAL HIGH (ref 0.44–1.00)
GFR, Estimated: 6 mL/min — ABNORMAL LOW (ref >60)
Glucose, Bld: 90 mg/dL (ref 70–99)
Potassium: 3.6 mmol/L (ref 3.5–5.1)
Sodium: 139 mmol/L (ref 135–145)
Total Bilirubin: 0.4 mg/dL (ref 0.3–1.2)
Total Protein: 5.6 g/dL — ABNORMAL LOW (ref 6.5–8.1)

## 2021-06-04 LAB — GLUCOSE, CAPILLARY
Glucose-Capillary: 98 mg/dL (ref 70–99)
Glucose-Capillary: 135 mg/dL — ABNORMAL HIGH (ref 70–99)
Glucose-Capillary: 169 mg/dL — ABNORMAL HIGH (ref 70–99)

## 2021-06-04 LAB — CULTURE, BLOOD (ROUTINE X 2)

## 2021-06-04 MED ORDER — DICLOFENAC SODIUM 1 % EX GEL
2.0000 g | Freq: Four times a day (QID) | CUTANEOUS | Status: AC
  Administered 2021-06-04 – 2021-06-05 (×8): 2 g via TOPICAL
  Filled 2021-06-04 (×3): qty 100

## 2021-06-04 NOTE — Progress Notes (Signed)
[facility] Progress Note  Subjective:  In good spirits, sitting in chair Family at bedside SCr down to 7.7, K 3.6, 1.7L UOP yesterday AF VSS on RA PO intake improving   Vitals:   06/04/21 0400 06/04/21 0824 06/04/21 1235 06/04/21 1254  BP: 135/75 (!) 148/74    Pulse: 60 63    Resp: 17 15    Temp: 98.1 F (36.7 C) 97.8 F (36.6 C)    TempSrc: Oral Oral (P) Oral   SpO2: 99%   99%  Weight:      Height:        Exam: Gen chronically ill appearing pleasant elderly frail female Sclera anicteric, throat clear  No jvd or bruits, flat NV Chest clear bilat to bases RRR no RG Abd soft ntnd no mass or ascites +bs Ext no LE or UE edema Neuro alert, Ox 3, chronic stutter and mild hand tremors       Home meds include - amiodarone, norvasc, asa, lipitor, hygroton, robaxin, lopressor, oxy IR prn, protonix, sl ntg prn, prns / vits / supps         Date                                     Creat               eGFR    2010- 2017                           0.89- 1.00    2019                                     1.22- 2.55    Dec 18- Apr 16 2021           1.91- 2.48        21-28 ml/min, CKD IV     Jun 01, 2021                        17.36, 15.65           UA 2/25 negative   UNa 73, UCr 122    CT abd 2/25 noncon - [facility]/ Kidney: IMPRESSION: No evidence of urolithiasis or hydronephrosis. Unremarkable unopacified urinary bladder     CXR - no acute disease   Assessment/ Plan: AKI on CKD IV - b/l creat 1.9- 2.4 from dec '22- jan '23, eGFR 21- 28 ml/min.  Creat here 17 on admission today w/ greater than 1 week long hx of N/V. Suspect AKI due to vol depletion and hypoperfusion, hemodynamic vs more likely ATN. Improving with volume repletion and supportive c are.  No indication for RRT. Hyperkalemia - Resolved. Will dc lokelma. Renal diet if possible.   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    Inpatient medications:  amiodarone  200 mg Oral Daily   atorvastatin  80 mg Oral Daily   chlorhexidine  15 mL Mouth Rinse BID   Chlorhexidine Gluconate Cloth  6 each Topical Q0600   diclofenac Sodium  2 g Topical QID   ferrous sulfate  325 mg Oral Q breakfast   folic acid  1 mg Oral Daily   heparin  5,000 Units Subcutaneous Q8H   mouth rinse  15 mL Mouth Rinse q12n4p   metoprolol tartrate  25 mg Oral BID   multivitamin with minerals  1 tablet Oral Daily   pantoprazole  40 mg Oral Daily   sodium chloride flush  3 mL Intravenous Q12H   sodium chloride flush  3 mL Intravenous Q12H   thiamine  100 mg Oral Daily    sodium chloride Stopped (06/02/21 0953)   sodium bicarbonate in 0.45 NS mL infusion 100 mL/hr at 06/04/21 1051   sodium chloride, acetaminophen **OR** acetaminophen, bisacodyl, diazepam, fentaNYL (SUBLIMAZE) injection, ondansetron **OR** ondansetron (ZOFRAN) IV, polyethylene glycol, sodium chloride flush, traZODone, white petrolatum

## 2021-06-04 NOTE — Evaluation (Signed)
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 Hyperkalemia, acute on chronic kidney disease, acute pancreatitis;   Clinical Impression   Chart reviewed, pt greeted in bedside chair, agreeable to OT evaluation. Pt is alert and oriented x4, good awareness and safety. Some STM deficits noted, will continue to assess. PTA pt was able to complete ADL with MOD I, assist provided for IADLs (friend brought food over). Mobility is limited at this time due to pt reported pain on dorsal surface of L foot, RN notified. Education provided re: compensatory cognitive strategies, home safety, safe ADL completion, falls prevention. Pt would benefit from inpatient OT To address functional deficits. No further OT recommended following discharge. OT will continue to follow acutely.    Recommendations for follow up therapy are one component of a multi-disciplinary discharge planning process, led by the attending physician.  Recommendations may be updated based on patient status, additional functional criteria and insurance authorization.   Follow Up Recommendations  No OT follow up    Assistance Recommended at Discharge Intermittent Supervision/Assistance  Patient can return home with the following Assistance with cooking/housework;Assist for transportation    Functional Status Assessment  Patient has had a recent decline in their functional status and demonstrates the ability to make significant improvements in function in a reasonable and predictable amount of time.  Equipment Recommendations  None recommended by OT    Recommendations for Other Services        Precautions / Restrictions Precautions Precautions: Fall Restrictions Weight Bearing Restrictions: No      Mobility Bed Mobility               General bed mobility comments: recieved in the chair    Transfers Overall transfer level: Needs assistance Equipment used: Rolling walker (2 wheels) Transfers: Sit to/from Stand Sit to Stand: Min assist           General transfer comment: due to L foot pain      Balance Overall balance assessment: Mild deficits observed, not formally tested                                         ADL either performed or assessed with clinical judgement   ADL Overall ADL's : Needs assistance/impaired Eating/Feeding: Sitting;Independent   Grooming: Wash/dry hands;Wash/dry face;Sitting;Set up               Lower Body Dressing: Minimal assistance Lower Body Dressing Details (indicate cue type and reason): socks in sitting Toilet Transfer: Minimal assistance;Rolling walker (2 wheels) Toilet Transfer Details (indicate cue type and reason): simulated         Functional mobility during ADLs: Minimal assistance;Rolling walker (2 wheels) General ADL Comments: decreased tolerance for mobility due to pain in L foot     Vision   Vision Assessment?: No apparent visual deficits     Perception     Praxis      Pertinent Vitals/Pain Pain Assessment Pain Assessment: 0-10 Pain Score: 6  Pain Location: L foot Pain Descriptors / Indicators: Aching, Discomfort Pain Intervention(s): Limited activity within patient's tolerance, Patient requesting  pain meds-RN notified, Repositioned, Monitored during session     Hand Dominance     Extremity/Trunk Assessment Upper Extremity Assessment Upper Extremity Assessment: Generalized weakness   Lower Extremity Assessment Lower Extremity Assessment: Generalized weakness   Cervical / Trunk Assessment Cervical / Trunk Assessment: Normal   Communication  Communication Communication: No difficulties;Other (comment) (pt with new stutter)   Cognition Arousal/Alertness: Awake/alert Behavior During Therapy: WFL for tasks assessed/performed Overall Cognitive Status: Impaired/Different from baseline Area of Impairment: Memory                     Memory: Decreased short-term memory (1/3 items after 5 minutes)               General Comments  vss on RA    Exercises     Shoulder Instructions      Home Living Family/patient expects to be discharged to:: Private residence Living Arrangements: Alone Available Help at Discharge: Friend(s);Available PRN/intermittently;Family Type of Home: House Home Access: Stairs to enter [employer] of Steps: 3 Entrance Stairs-Rails: Left Home Layout: One level     Bathroom Shower/Tub: Teacher, early years/pre: Standard     Home Equipment: Conservation officer, nature (2 wheels);Shower seat          Prior Functioning/Environment Prior Level of Function : Needs assist       Physical Assist : ADLs (physical)   ADLs (physical): IADLs Mobility Comments: RW inside home ADLs Comments: sink bath, assist with IADLs        OT Problem List: Decreased strength;Impaired balance (sitting and/or standing);Decreased cognition;Decreased activity tolerance      OT Treatment/Interventions: Self-care/ADL training;DME and/or AE instruction;Therapeutic activities;Therapeutic exercise;Cognitive remediation/compensation;Energy conservation;Patient/family education    OT Goals(Current goals can be found in the care plan section) Acute Rehab OT Goals Patient Stated Goal: to go home OT Goal Formulation: With patient Time For Goal Achievement: 06/18/21 Potential to Achieve Goals: Good ADL Goals Pt Will Perform Upper Body Dressing: Independently Pt Will Perform Lower Body Dressing: with modified independence Pt Will Transfer to Toilet: with modified independence Pt Will Perform Toileting  - Clothing Manipulation and hygiene: with modified independence  OT Frequency: Min 2X/week    Co-evaluation              AM-PAC OT "6 Clicks" Daily Activity     Outcome Measure Help from another person eating meals?: None Help from another person taking care of personal grooming?: None Help from another person toileting, which includes using toliet, bedpan, or urinal?: None Help from another person bathing (including washing, rinsing, drying)?: A Little Help from another person to put on and taking off regular upper body clothing?: A Little Help from another person to put on and taking off regular lower body clothing?: A Little 6 Click Score: 21   End of Session Equipment Utilized During Treatment: Rolling walker (2 wheels) Nurse Communication: Mobility status;Other (comment) (pain in L foot limiting mobility)  Activity Tolerance: Patient limited by pain Patient left: in chair;with call bell/phone within reach;with chair alarm set;with family/visitor present  OT Visit Diagnosis: Unsteadiness on feet (R26.81)                Time: 4818-5631 OT Time Calculation (min): 22 min Charges:  OT General Charges $OT Visit: 1 Visit OT Evaluation $OT Eval Low Complexity: 1 Low  Shanon Payor, OTD OTR/L  06/04/21, 1:12 PM

## 2021-06-04 NOTE — Progress Notes (Signed)
PROGRESS NOTE    Dorothy Harris  DTH:[id number] DOB: [birth year] DOA: 06/01/2021 PCP: Pcp, No   Chief Complaint  Patient presents with   Emesis    Brief Narrative:   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   [name] states that she has been feeling ill for approximately 1 week now.  Symptoms initially started with nausea and vomiting that progressed to shortness of breath with exertion and at rest.  Emesis has been nonbloody, nonbilious.  She denies any fever but endorses chills beginning today.  She endorses some short-term memory loss that seems to have begun today as well as generalized weakness.  She denies any abdominal pain, chest pain, palpitations, diarrhea, constipation.  She denies any metallic taste in her mouth. [name] states she has not had any alcoholic beverage since December 2022 nor tobacco use since then either.   On arrival to ED, BMP showed significantly elevated potassium at 6.7, creatinine of 17, and bicarb of 10.  Lipase elevated at 607. No signs of necrosis on CT. Patient was started on insulin with D50, bicarb, calcium gluconate. Transferred to [facility] for potential CRRT.  -Function has been improving without any intervention, so far she did not require any HD.  Assessment & Plan:   Principal Problem:   Acute hyperkalemia Active Problems:   S/P ascending aortic replacement   AKI (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   PAF (paroxysmal atrial fibrillation) (HCC)   Pressure injury of skin  Acute Kidney Injury ON CKD Stage 4 -Her baseline creatinine around 2, creatinine significantly elevated at 17 on admission, improving, trending down with hydration, this morning is 7.6. -Most likely prerenal with long history of nausea, vomiting.   - Nephrology  consulted; appreciate their recommendations -On IV fluids, stopped by renal today. - Insert foley cathter - Strict UOP  Acute Severe Hyperkalemia  - Initially 6.7 on admission with evidence of peaked T waves on EKG. Received insulin + dextrose x 2, Lasix with no urine output, several amps of sodium bicarb and Lokelma.  -Renal input greatly appreciated,  no need for dialysis. -Lokelma has been discontinued.   Anion-Gap Metabolic Acidosis -Improving, bicarb drip initially, IV fluids has been discontinued by renal, encourage oral intake.    Acute Pancreatitis  History of AUD  - Lipase elevated to 604 with no signs of necrosis or inflammation on CT. -She reports last alcoholic beverage on 72/82/0601 - No evidence of gallstones or biliary duct dilatation on CT to suggest gallstone pancreatitis.  -Tolerating soft diet  Demand Ischemia  - Mild troponin leak. No ischemic changes on EKG per Cardiology.    History of Hypertension  - Holding home Amlodipine and Chlorthalidone    History of A. Fib, paroxysmal  - Continue home Amiodarone, Metoprolol -It was postoperative atrial fibrillation during her hospitalization last December, plan per her primary cardiologist Dr. Skeet Harris to continue with amiodarone through March 2023, then to stop it and to continue with metoprolol -No evidence of recurrent episodes, so she has not been on anticoagulation.   History of HFpEF - Hypovolemic on examination. 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     DVT prophylaxis: [place] Heparin Code Status: Full Family Communication:   sister at bedside Disposition:   Status is: Inpatient Remains inpatient appropriate because: AKI           Consultants:  Renal  PCCM   Subjective:  Reports she is feeling better today, no nausea, no vomiting, good oral intake, left foot pain has improved  Objective: Vitals:   06/04/21 0400 06/04/21 0824 06/04/21 1235 06/04/21 1254  BP: 135/75 (!) 148/74    Pulse: 60 63    Resp: 17 15    Temp: 98.1 F (36.7 C) 97.8 F (36.6 C)    TempSrc: Oral Oral (P) Oral   SpO2: 99%   99%  Weight:      Height:        Intake/Output Summary (Last 24 hours) at 06/04/2021 1326 Last data filed at 06/04/2021 1200 Gross per 24 hour  Intake --  Output 2070 ml  Net -2070 ml   Filed Weights   06/01/21 1615 06/03/21 0009 06/03/21 0500  Weight: 80.4 kg 85.8 kg 85.3 kg    Examination:  Awake Alert, Oriented X 3, No new F.N deficits, Normal affect Symmetrical Chest wall movement, Good air movement bilaterally, CTAB RRR,No Gallops,Rubs or new Murmurs, No Parasternal Heave +ve B.Sounds, abdomen nontender, midline surgical wound bandaged, right colostomy. No Cyanosis, Clubbing or edema, left foot with no erythema, it is less tender today.     Data Reviewed: I have personally reviewed following labs and imaging studies  CBC: Recent Labs  Lab 06/01/21 0958 06/02/21 0356 06/03/21 0355 06/04/21 0432  WBC 8.5 6.5 8.6 8.1  HGB 11.2* 8.4* 8.6* 8.1*  HCT 36.4 26.6* 27.0* 25.0*  MCV 101.4* 98.5 95.7 95.1  PLT 328 218 235 588    Basic Metabolic Panel: Recent Labs  Lab 06/01/21 1314 06/01/21 1802 06/02/21 0016 06/02/21 0356 06/02/21 0714 06/02/21 2021 06/03/21 0036 06/03/21 0355 06/03/21 0805 06/04/21 0432  NA 133*   138 137  --  138  --   --   --  138  --  139  K 5.9*   6.2* 4.5   < > 4.3   < > 4.0 3.9 3.6 3.8 3.6  CL 107   107 106  --  109  --   --   --  105  --  105  CO2 12*   13* 12*  --  14*  --   --   --  20*  --  22  GLUCOSE 161*   163* 187*  --  113*  --   --    --  139*  --  90  BUN 144*   144* 138*  --  123*  --   --   --  105*  --  86*  CREATININE 15.35*   15.65* 14.74*  --  13.06*  --   --   --  10.22*  --  7.68*  CALCIUM 8.2*   8.6* 8.7*  --  7.8*  --   --   --  8.2*  --  7.9*  PHOS 9.9*  --   --   --   --   --   --   --   --   --    < > =  values in this interval not displayed.    GFR: Estimated Creatinine Clearance: 8.7 mL/min (A) (by C-G formula based on SCr of 7.68 mg/dL (H)).  Liver Function Tests: Recent Labs  Lab 06/01/21 0958 06/01/21 1314 06/02/21 0356 06/03/21 0355 06/04/21 0432  AST 18  --  $R'17 21 22  'aQ$ ALT 11  --  $R'10 12 15  'yW$ ALKPHOS 107  --  68 70 67  BILITOT 0.9  --  0.3 0.3 0.4  PROT 8.7*  --  5.5* 5.7* 5.6*  ALBUMIN 4.6 3.8 2.9* 2.9* 2.7*    CBG: Recent Labs  Lab 06/03/21 1153 06/03/21 1702 06/03/21 2031 06/04/21 0823 06/04/21 1236  GLUCAP 143* 158* 164* 98 135*     Recent Results (from the past 240 hour(s))  Blood culture (routine x 2)     Status: Abnormal   Collection Time: 06/01/21  9:51 AM   Specimen: Left Antecubital; Blood  Result Value Ref Range Status   Specimen Description   Final    LEFT ANTECUBITAL BOTTLES DRAWN AEROBIC AND ANAEROBIC Performed at [facility], [address] [place], [place] [address]    Special Requests   Final    Blood Culture results may not be optimal due to an excessive volume of blood received in culture bottles Performed at [facility], [address] [place], [place] [address]    Culture  Setup Time   Final    GRAM POSITIVE COCCI AEROBIC BOTTLE ONLY Gram Stain Report Called to,Read Back By and Verified With: M. WOOD @ [phone] 06/02/21 BY STEPHTR CRITICAL RESULT CALLED TO, READ BACK BY AND VERIFIED WITH: K,AMEND PHARMD $RemoveBefore'@1722'[id number]$  06/02/21 EB    Culture (A)  Final    STAPHYLOCOCCUS EPIDERMIDIS THE SIGNIFICANCE OF ISOLATING THIS ORGANISM FROM A SINGLE SET OF BLOOD CULTURES WHEN MULTIPLE SETS ARE DRAWN IS UNCERTAIN. PLEASE NOTIFY THE MICROBIOLOGY DEPARTMENT WITHIN ONE WEEK IF  SPECIATION AND SENSITIVITIES ARE REQUIRED. Performed at [facility], [place] [address], [place], [place] [address]    Report Status 06/04/2021 FINAL  Final  Blood Culture ID Panel (Reflexed)     Status: Abnormal   Collection Time: 06/01/21  9:51 AM  Result Value Ref Range Status   Enterococcus faecalis NOT DETECTED NOT DETECTED Final   Enterococcus Faecium NOT DETECTED NOT DETECTED Final   Listeria monocytogenes NOT DETECTED NOT DETECTED Final   Staphylococcus species DETECTED (A) NOT DETECTED Final    Comment: CRITICAL RESULT CALLED TO, READ BACK BY AND VERIFIED WITH: K,AMEND PHARMD $RemoveBefore'@1722'[id number]$  06/02/21 EB    Staphylococcus aureus (BCID) NOT DETECTED NOT DETECTED Final   Staphylococcus epidermidis DETECTED (A) NOT DETECTED Final    Comment: CRITICAL RESULT CALLED TO, READ BACK BY AND VERIFIED WITH: K,AMEND PHARMD $RemoveBefore'@1722'[id number]$  06/02/21 EB    Staphylococcus lugdunensis NOT DETECTED NOT DETECTED Final   Streptococcus species NOT DETECTED NOT DETECTED Final   Streptococcus agalactiae NOT DETECTED NOT DETECTED Final   Streptococcus pneumoniae NOT DETECTED NOT DETECTED Final   Streptococcus pyogenes NOT DETECTED NOT DETECTED Final   A.calcoaceticus-baumannii NOT DETECTED NOT DETECTED Final   Bacteroides fragilis NOT DETECTED NOT DETECTED Final   Enterobacterales NOT DETECTED NOT DETECTED Final   Enterobacter cloacae complex NOT DETECTED NOT DETECTED Final   Escherichia coli NOT DETECTED NOT DETECTED Final   Klebsiella aerogenes NOT DETECTED NOT DETECTED Final   Klebsiella oxytoca NOT DETECTED NOT DETECTED Final   Klebsiella pneumoniae NOT DETECTED NOT DETECTED Final   Proteus species NOT DETECTED NOT DETECTED Final  Salmonella species NOT DETECTED NOT DETECTED Final   Serratia marcescens NOT DETECTED NOT DETECTED Final   Haemophilus influenzae NOT DETECTED NOT DETECTED Final   Neisseria meningitidis NOT DETECTED NOT DETECTED Final   Pseudomonas aeruginosa NOT DETECTED NOT DETECTED Final    Stenotrophomonas maltophilia NOT DETECTED NOT DETECTED Final   Candida albicans NOT DETECTED NOT DETECTED Final   Candida auris NOT DETECTED NOT DETECTED Final   Candida glabrata NOT DETECTED NOT DETECTED Final   Candida krusei NOT DETECTED NOT DETECTED Final   Candida parapsilosis NOT DETECTED NOT DETECTED Final   Candida tropicalis NOT DETECTED NOT DETECTED Final   Cryptococcus neoformans/gattii NOT DETECTED NOT DETECTED Final   Methicillin resistance mecA/C NOT DETECTED NOT DETECTED Final    Comment: Performed at [facility], [address] [place], [place] [address]  Blood culture (routine x 2)     Status: None (Preliminary result)   Collection Time: 06/01/21  9:56 AM   Specimen: Left Antecubital; Blood  Result Value Ref Range Status   Specimen Description   Final    LEFT ANTECUBITAL BOTTLES DRAWN AEROBIC AND ANAEROBIC   Special Requests Blood Culture adequate volume  Final   Culture   Final    NO GROWTH 3 DAYS Performed at [facility], [address] [place], [place] [address]    Report Status PENDING  Incomplete  Resp Panel by RT-PCR (Flu A&B, Covid) Nasopharyngeal Swab     Status: None   Collection Time: 06/01/21 10:03 AM   Specimen: Nasopharyngeal Swab; Nasopharyngeal(NP) swabs in vial transport medium  Result Value Ref Range Status   SARS Coronavirus 2 by RT PCR NEGATIVE NEGATIVE Final    Comment: (NOTE) SARS-CoV-2 target nucleic acids are NOT DETECTED.  The SARS-CoV-2 RNA is generally detectable in upper respiratory specimens during the acute phase of infection. The lowest concentration of SARS-CoV-2 viral copies this assay can detect is 138 copies/mL. A negative result does not preclude SARS-Cov-2 infection and should not be used as the sole basis for treatment or other patient management decisions. A negative result may occur with  improper specimen collection/handling, submission of specimen other than nasopharyngeal swab, presence of viral mutation(s)  within the areas targeted by this assay, and inadequate number of viral copies(<138 copies/mL). A negative result must be combined with clinical observations, patient history, and epidemiological information. The expected result is Negative.  Fact Sheet for Patients:  EntrepreneurPulse.com.au  Fact Sheet for Healthcare Providers:  IncredibleEmployment.be  This test is no t yet approved or cleared by the [place] FDA and  has been authorized for detection and/or diagnosis of SARS-CoV-2 by FDA under an Emergency Use Authorization (EUA). This EUA will remain  in effect (meaning this test can be used) for the duration of the COVID-19 declaration under Section 564(b)(1) of the Act, 21 U.S.C.section 360bbb-3(b)(1), unless the authorization is terminated  or revoked sooner.       Influenza A by PCR NEGATIVE NEGATIVE Final   Influenza B by PCR NEGATIVE NEGATIVE Final    Comment: (NOTE) The Xpert Xpress SARS-CoV-2/FLU/RSV plus assay is intended as an aid in the diagnosis of influenza from Nasopharyngeal swab specimens and should not be used as a sole basis for treatment. Nasal washings and aspirates are unacceptable for Xpert Xpress SARS-CoV-2/FLU/RSV testing.  Fact Sheet for Patients: EntrepreneurPulse.com.au  Fact Sheet for Healthcare Providers: IncredibleEmployment.be  This test is not yet approved or cleared by the [place] FDA and has been authorized for detection and/or diagnosis  of SARS-CoV-2 by FDA under an Emergency Use Authorization (EUA). This EUA will remain in effect (meaning this test can be used) for the duration of the COVID-19 declaration under Section 564(b)(1) of the Act, 21 U.S.C. section 360bbb-3(b)(1), unless the authorization is terminated or revoked.  Performed at [facility], [address] [place], [place] [address]   MRSA Next Gen by PCR, Nasal     Status: None    Collection Time: 06/01/21  4:51 PM   Specimen: Nasal Mucosa; Nasal Swab  Result Value Ref Range Status   MRSA by PCR Next Gen NOT DETECTED NOT DETECTED Final    Comment: (NOTE) The GeneXpert MRSA Assay (FDA approved for NASAL specimens only), is one component of a comprehensive MRSA colonization surveillance program. It is not intended to diagnose MRSA infection nor to guide or monitor treatment for MRSA infections. Test performance is not FDA approved in patients less than 61 years old. Performed at [facility], [place] [address], [place], [place] [address]   Urine Culture     Status: None   Collection Time: 06/01/21  6:44 PM   Specimen: In/Out Cath Urine  Result Value Ref Range Status   Specimen Description IN/OUT CATH URINE  Final   Special Requests NONE  Final   Culture   Final    NO GROWTH Performed at [facility], [place] [address], [place], [place] [address]    Report Status 06/02/2021 FINAL  Final         Radiology Studies: DG Foot 2 Views Left  Result Date: 06/03/2021 CLINICAL DATA:  Left foot pain. EXAM: LEFT FOOT - 2 VIEW COMPARISON:  None. FINDINGS: Old fifth metatarsal fracture is noted. Mild hallux valgus deformity of first metatarsophalangeal joint is noted. No acute fracture or dislocation is noted. No soft tissue abnormality is noted. IMPRESSION: Chronic findings as described above.  No acute abnormality seen. Electronically Signed   By: Marijo Conception M.D.   On: 06/03/2021 11:49        Scheduled Meds:  amiodarone  200 mg Oral Daily   atorvastatin  80 mg Oral Daily   chlorhexidine  15 mL Mouth Rinse BID   Chlorhexidine Gluconate Cloth  6 each Topical Q0600   diclofenac Sodium  2 g Topical QID   ferrous sulfate  325 mg Oral Q breakfast   folic acid  1 mg Oral Daily   heparin  5,000 Units Subcutaneous Q8H   mouth rinse  15 mL Mouth Rinse q12n4p   metoprolol tartrate  25 mg Oral BID   multivitamin with minerals  1 tablet Oral Daily    pantoprazole  40 mg Oral Daily   sodium chloride flush  3 mL Intravenous Q12H   sodium chloride flush  3 mL Intravenous Q12H   thiamine  100 mg Oral Daily   Continuous Infusions:  sodium chloride Stopped (06/02/21 0953)     LOS: 3 days        Phillips Climes, MD [facility]   To contact the attending provider between 7A-7P or the covering provider during after hours 7P-7A, please log into the web site [email or web address] and access using universal [place] password for that web site. If you do not have the password, please call the hospital operator.  06/04/2021, 1:26 PM

## 2021-06-05 ENCOUNTER — Ambulatory Visit: Payer: PRIVATE HEALTH INSURANCE

## 2021-06-05 LAB — COMPREHENSIVE METABOLIC PANEL
ALT: 17 U/L (ref 0–44)
AST: 18 U/L (ref 15–41)
Albumin: 2.7 g/dL — ABNORMAL LOW (ref 3.5–5.0)
Alkaline Phosphatase: 58 U/L (ref 38–126)
Anion gap: 10 (ref 5–15)
BUN: 73 mg/dL — ABNORMAL HIGH (ref 6–20)
CO2: 24 mmol/L (ref 22–32)
Calcium: 8.2 mg/dL — ABNORMAL LOW (ref 8.9–10.3)
Chloride: 106 mmol/L (ref 98–111)
Creatinine, Ser: 5.68 mg/dL — ABNORMAL HIGH (ref 0.44–1.00)
GFR, Estimated: 8 mL/min — ABNORMAL LOW (ref >60)
Glucose, Bld: 99 mg/dL (ref 70–99)
Potassium: 4.2 mmol/L (ref 3.5–5.1)
Sodium: 140 mmol/L (ref 135–145)
Total Bilirubin: 0.6 mg/dL (ref 0.3–1.2)
Total Protein: 5.5 g/dL — ABNORMAL LOW (ref 6.5–8.1)

## 2021-06-05 LAB — CBC
HCT: 24.5 % — ABNORMAL LOW (ref 36.0–46.0)
Hemoglobin: 7.9 g/dL — ABNORMAL LOW (ref 12.0–15.0)
MCH: 30.6 pg (ref 26.0–34.0)
MCHC: 32.2 g/dL (ref 30.0–36.0)
MCV: 95 fL (ref 80.0–100.0)
Platelets: 220 10*3/uL (ref 150–400)
RBC: 2.58 MIL/uL — ABNORMAL LOW (ref 3.87–5.11)
RDW: 15.5 % (ref 11.5–15.5)
WBC: 8.1 10*3/uL (ref 4.0–10.5)
nRBC: 0 % (ref 0.0–0.2)

## 2021-06-05 LAB — GLUCOSE, CAPILLARY
Glucose-Capillary: 114 mg/dL — ABNORMAL HIGH (ref 70–99)
Glucose-Capillary: 127 mg/dL — ABNORMAL HIGH (ref 70–99)
Glucose-Capillary: 230 mg/dL — ABNORMAL HIGH (ref 70–99)
Glucose-Capillary: 186 mg/dL — ABNORMAL HIGH (ref 70–99)

## 2021-06-05 LAB — URIC ACID: Uric Acid, Serum: 10.5 mg/dL — ABNORMAL HIGH (ref 2.5–7.1)

## 2021-06-05 MED ORDER — PREDNISONE 20 MG PO TABS
40.0000 mg | ORAL_TABLET | Freq: Once | ORAL | Status: AC
  Administered 2021-06-05: 40 mg via ORAL
  Filled 2021-06-05: qty 2

## 2021-06-05 MED ORDER — ALLOPURINOL 100 MG PO TABS
100.0000 mg | ORAL_TABLET | Freq: Every day | ORAL | Status: DC
  Administered 2021-06-05 – 2021-06-06 (×2): 100 mg via ORAL
  Filled 2021-06-05 (×2): qty 1

## 2021-06-05 MED ORDER — PREDNISONE 20 MG PO TABS
40.0000 mg | ORAL_TABLET | Freq: Every day | ORAL | Status: DC
Start: 2021-06-05 — End: 2021-06-05

## 2021-06-05 NOTE — Plan of Care (Signed)

## 2021-06-05 NOTE — Progress Notes (Signed)
Physical Therapy Treatment ?Patient Details ?Name: Dorothy Harris ?MRN: [id number] ?DOB: [birth year] ?Today's Date: 06/05/2021 ? ? 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 Hyperkalemia, acute on chronic kidney disease, acute pancreatitis; ? ?  ?PT Comments  ? ? Patient continues to perform bed mobility independently, however requires min assist for transfer and minguard for gait x 3 ft--limited by left foot pain. Notified MD as currently she would not be able to go home alone. Will need better pain control to allow discharge home.  ?   ?Recommendations for follow up therapy are one component of a multi-disciplinary discharge planning process, led by the attending physician.  Recommendations may be updated based on patient status, additional functional criteria and insurance authorization. ? ?Follow Up Recommendations ? Home health PT (may need SNF if left foot pain persists) ?  ?  ?Assistance Recommended at Discharge PRN  ?Patient can return home with the following Assistance with cooking/housework;Help with stairs or ramp for entrance ?  ?Equipment Recommendations ? None recommended by PT  ?  ?Recommendations for Other Services   ? ? ?  ?Precautions / Restrictions Precautions ?Precautions: Fall ?Restrictions ?Weight Bearing Restrictions: No  ?  ? ?Mobility ? Bed Mobility ?Overal bed mobility: Modified Independent ?  ?  ?  ?  ?  ?  ?  ?  ? ?Transfers ?Overall transfer level: Needs assistance ?Equipment used: Rolling walker (2 wheels) ?Transfers: Sit to/from Stand ?Sit to Stand: Min assist ?  ?  ?  ?  ?  ?General transfer comment: due to L foot pain ?  ? ?Ambulation/Gait ?Ambulation/Gait assistance: Min guard ?Gait Distance (Feet): 3 Feet ?Assistive device: Rolling walker (2  wheels) ?Gait Pattern/deviations: Trunk flexed, Step-to pattern, Decreased stride length, Antalgic ?Gait velocity: decr ?  ?  ?General Gait Details: vc for keeping left foot within frame of RW (wanting to walk with it sticking outside left legs of RW); poor use of UEs to off-load left foot ? ? ?Stairs ?  ?  ?  ?  ?  ? ? ?Wheelchair Mobility ?  ? ?Modified Rankin (Stroke Patients Only) ?  ? ? ?  ?Balance Overall balance assessment: Mild deficits observed, not formally tested ?  ?  ?  ?  ?  ?  ?  ?  ?  ?  ?  ?  ?  ?  ?  ?  ?  ?  ?  ? ?  ?Cognition Arousal/Alertness: Awake/alert ?Behavior During Therapy: [facility] for tasks assessed/performed ?Overall Cognitive Status: Impaired/Different from baseline ?Area of Impairment: Memory ?  ?  ?  ?  ?  ?  ?  ?  ?  ?  ?Memory: Decreased short-term memory (PTA per pt) ?  ?  ?  ?  ?General Comments: following all commands briskly ?  ?  ? ?  ?Exercises   ? ?  ?General Comments General comments (skin integrity, edema, etc.): pt denies h/o gout ?  ?  ? ?Pertinent Vitals/Pain Pain Assessment ?Pain Assessment: 0-10 ?Pain Score: 8  ?Pain Location: L foot ?Pain Descriptors / Indicators: Aching, Discomfort, Guarding, Grimacing ?Pain Intervention(s): Limited activity within patient's tolerance, Monitored during session, Repositioned  ? ? ?Home Living   ?  ?  ?  ?  ?  ?  ?  ?  ?  ?   ?  ?  Prior Function    ?  ?  ?   ? ?PT Goals (current goals can now be found in the care plan section) Acute Rehab PT Goals ?Patient Stated Goal: go straight home without need for rehab prior ?Time For Goal Achievement: 06/17/21 ?Potential to Achieve Goals: Good ?Progress towards PT goals: Not progressing toward goals - comment (due to left foot pain) ? ?  ?Frequency ? ? ? Min 3X/week ? ? ? ?  ?PT Plan Discharge plan needs to be updated (if left foot pain controlled)  ? ? ?Co-evaluation   ?  ?  ?  ?  ? ?  ?AM-PAC PT "6 Clicks" Mobility   ?Outcome Measure ? Help needed turning from your back to your side while in a  flat bed without using bedrails?: None ?Help needed moving from lying on your back to sitting on the side of a flat bed without using bedrails?: None ?Help needed moving to and from a bed to a chair (including a wheelchair)?: A Little ?Help needed standing up from a chair using your arms (e.g., wheelchair or bedside chair)?: A Little ?Help needed to walk in hospital room?: A Little ?Help needed climbing 3-5 steps with a railing? : A Little ?6 Click Score: 20 ? ?  ?End of Session Equipment Utilized During Treatment: Gait belt ?Activity Tolerance: Patient limited by pain ?Patient left: with call bell/phone within reach;in chair;with chair alarm set ?Nurse Communication: Mobility status;Other (comment) (left foot pain) ?PT Visit Diagnosis: Other abnormalities of gait and mobility (R26.89);Muscle weakness (generalized) (M62.81) ?  ? ? ?Time: 4010-2725 ?PT Time Calculation (min) (ACUTE ONLY): 16 min ? ?Charges:  $Therapeutic Activity: 8-22 mins          ?          ? ? ?Arby Barrette, PT ?Acute Rehabilitation Services  ?Pager [phone] ?Office [phone] ? ? ? ?Jeanie Cooks Zarayah Lanting ?06/05/2021, 8:16 AM ? ?

## 2021-06-05 NOTE — Progress Notes (Signed)
[facility] ?Progress Note ? ?Subjective:  ?In good spirits, sitting in chair ?Family at bedside ?SCr down to 5.7, K 3.6, UOP not charted yesterday ?AF VSS on RA ?PO intake improving ? ? ?Vitals:  ? 06/04/21 1635 06/04/21 2000 06/05/21 0820 06/05/21 1156  ?BP: (!) 145/91 126/74 (!) 143/76 127/82  ?Pulse: 65 67 67 68  ?Resp: 20 (!) $Remo'21 17 17  'xwQwC$ ?Temp: 98 ?F (36.7 ?C) 98.1 ?F (36.7 ?C) 98.3 ?F (36.8 ?C) 97.8 ?F (36.6 ?C)  ?TempSrc: Oral Oral Oral Oral  ?SpO2: 100% 100% 100% 100%  ?Weight:      ?Height:      ? ? ?Exam: ?Gen chronically ill appearing pleasant elderly frail female ?Sclera anicteric, throat clear  ?No jvd or bruits, flat NV ?Chest clear bilat to bases ?RRR no RG ?Abd soft ntnd no mass or ascites +bs ?Ext no LE or UE edema ?Neuro alert, Ox 3, chronic stutter and mild hand tremors  ?  ?  ? Home meds include - amiodarone, norvasc, asa, lipitor, hygroton, robaxin, lopressor, oxy IR prn, protonix, sl ntg prn, prns / vits / supps ?  ?  ?    Date                                     Creat               eGFR ?   2010- 2017                           0.89- 1.00 ?   2019                                     1.22- 2.55 ?   Dec 18- Apr 16 2021           1.91- 2.48        21-28 ml/min, CKD IV  ?   Jun 01, 2021                        17.36, 15.65     ?  ?  ?  UA 2/25 negative ?  UNa 73, UCr 122 ?   CT abd 2/25 noncon - [facility]/ Kidney: IMPRESSION: No evidence of urolithiasis or hydronephrosis. Unremarkable unopacified urinary bladder ?    CXR - no acute disease ?  ?Assessment/ Plan: ?Resolving nonoliguric AKI on CKD IV - b/l creat 1.9- 2.4 from dec '22- jan '23, eGFR 21- 28 ml/min.  Creat here 17 on admission today w/ greater than 1 week long hx of N/V. Suspect AKI due to vol depletion and hypoperfusion, hemodynamic vs more likely ATN. Improving with volume repletion and supportive c are.  No indication for RRT. ?Hyperkalemia - Resolved. Off lokelma. Renal diet if possible.   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 ?Will sign off for now.  Please call with any questions or concerns.  Pt dose need follow up with nephrology and I will make arrangements for her to be seen in our [place] location  w/in the next month  ?  ?Rexene Agent, MD  ?06/05/2021, 12:23 PM ? ? ?Recent Labs  ?Lab 06/01/21 ?1314 06/01/21 ?1802 06/04/21 ?0432 06/05/21 ?0401  ?K 5.9*  6.2*   < > 3.6 4.2  ?BUN 144*  144*   < > 86* 73*  ?CREATININE 15.35*  15.65*   < > 7.68* 5.68*  ?ALBUMIN 3.8   < > 2.7* 2.7*  ?CALCIUM 8.2*  8.6*   < > 7.9* 8.2*  ?PHOS 9.9*  --   --   --   ?HGB  --    < > 8.1* 7.9*  ? < > = values in this interval not displayed.  ? ? ?Inpatient medications: ? amiodarone  200 mg Oral Daily  ? atorvastatin  80 mg Oral Daily  ? chlorhexidine  15 mL Mouth Rinse BID  ? Chlorhexidine Gluconate Cloth  6 each Topical Q0600  ? diclofenac Sodium  2 g Topical QID  ? ferrous sulfate  325 mg Oral Q breakfast  ? folic acid  1 mg Oral Daily  ? heparin  5,000 Units Subcutaneous Q8H  ? mouth rinse  15 mL Mouth Rinse q12n4p  ? metoprolol tartrate  25 mg Oral BID  ? multivitamin with minerals  1 tablet Oral Daily  ? pantoprazole  40 mg Oral Daily  ? predniSONE  40 mg Oral Once  ? sodium chloride flush  3 mL Intravenous Q12H  ? sodium chloride flush  3 mL Intravenous Q12H  ? thiamine  100 mg Oral Daily  ? ? sodium chloride Stopped (06/02/21 0953)  ? ?sodium chloride, acetaminophen **OR** acetaminophen, bisacodyl, diazepam, fentaNYL (SUBLIMAZE) injection, ondansetron **OR** ondansetron (ZOFRAN) IV, polyethylene glycol, sodium chloride flush, traZODone, white petrolatum ? ? ? ? ? ? ?

## 2021-06-05 NOTE — Progress Notes (Signed)
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  Poor appetite for about 1 week.  On arrival to the emergency room potassium 6.7, creatinine 17 and bicarbonate of 10.  She is admitted to the hospital with acute renal failure and hyperkalemia.  Assessment & Plan:   Acute Kidney Injury on CKD Stage 3B: -Her baseline creatinine around 2, creatinine significantly elevated at 17 on admission, improving, trending down with hydration,  -Urine output more than 900 mL last 24 hours.  Continue Foley catheter today. -Most likely prerenal with long history of nausea, vomiting.  - Nephrology consulted; appreciate their recommendations.  Signed off today. -Monitoring off IV fluids.   Acute Severe Hyperkalemia  - Initially 6.7 on admission with evidence of peaked T waves on EKG. Received insulin + dextrose x 2, Lasix with no urine output, several amps of sodium bicarb and Lokelma.  -Renal input greatly appreciated,  no need for dialysis. -Lokelma has been discontinued.  Potassium is stabilized.   Anion-Gap Metabolic Acidosis -Improving, bicarb drip initially, IV fluids has been discontinued by renal, encourage oral intake.     Demand Ischemia  - Mild troponin leak. No ischemic changes on EKG per Cardiology.    History of Hypertension  - Holding home Amlodipine and Chlorthalidone.   History of A. Fib, paroxysmal  - Continue home Amiodarone, Metoprolol -It was postoperative atrial fibrillation during her hospitalization last December, plan per her primary cardiologist Dr. Chilton Si to continue with amiodarone through March 2023, then to stop it and to continue with  metoprolol -No evidence of recurrent episodes, so she has not been on anticoagulation.   History of HFpEF - Hypovolemic on examination. 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  X-ray was normal. Uric acid more than 10. 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  Cannot have colchicine with worsening renal functions. Prednisone 40 mg x 1, Tylenol, local hot compress.  Will start on allopurinol.  DVT prophylaxis: heparin injection 5,000 Units Start: 06/02/21 0600 SCDs Start: 06/01/21 1503 Place TED hose Start: 06/01/21 1503   Code Status: Full code Family Communication: Sister at the bedside Disposition Plan: Status is: Inpatient Remains inpatient appropriate because: Renal functions recovery, left foot pain under investigation.             Consultants:  Nephrology  Procedures:  None  Antimicrobials:  None   Subjective: Patient seen and examined.  Sister at the bedside.  Denies any nausea vomiting or abdominal pain today.  Left foot hurts and that his main problem today.  No history of gout.  Uric acid was checked and it was more than 10.  Objective: Vitals:   06/04/21 1635 06/04/21 2000 06/05/21 0820 06/05/21 1156  BP: (!) 145/91 126/74 (!) 143/76 127/82  Pulse: 65 67 67 68  Resp: 20 (!) 21 17 17   Temp: 98 F (36.7 C) 98.1 F (  36.7 C) 98.3 F (36.8 C) 97.8 F (36.6 C)  TempSrc: Oral Oral Oral Oral  SpO2: 100% 100% 100% 100%  Weight:      Height:        Intake/Output Summary (Last 24 hours) at 06/05/2021 1321 Last data filed at 06/05/2021 0600 Gross per 24 hour  Intake --  Output 700 ml  Net -700 ml   Filed Weights   06/01/21 1615 06/03/21 0009 06/03/21  0500  Weight: 80.4 kg 85.8 kg 85.3 kg    Examination:  General exam: Chronically sick looking.  Not in any distress.  On room air. Respiratory system: Clear to auscultation. Respiratory effort normal.  No added sounds. Cardiovascular system: S1 & S2 heard, RRR.  Gastrointestinal system: Soft and nontender.  Bowel sound present.  Right lower quadrant ileostomy with loose stool. Central nervous system: Alert and oriented. No focal neurological deficits. Extremities: Symmetric 5 x 5 power. Diffuse tender swelling of the dorsum of the left foot, no localized fluctuation or erythema.    Data Reviewed: I have personally reviewed following labs and imaging studies  CBC: Recent Labs  Lab 06/01/21 0958 06/02/21 0356 06/03/21 0355 06/04/21 0432 06/05/21 0401  WBC 8.5 6.5 8.6 8.1 8.1  HGB 11.2* 8.4* 8.6* 8.1* 7.9*  HCT 36.4 26.6* 27.0* 25.0* 24.5*  MCV 101.4* 98.5 95.7 95.1 95.0  PLT 328 218 235 229 885   Basic Metabolic Panel: Recent Labs  Lab 06/01/21 1314 06/01/21 1802 06/02/21 0016 06/02/21 0356 06/02/21 0714 06/03/21 0036 06/03/21 0355 06/03/21 0805 06/04/21 0432 06/05/21 0401  NA 133*   138 137  --  138  --   --  138  --  139 140  K 5.9*   6.2* 4.5   < > 4.3   < > 3.9 3.6 3.8 3.6 4.2  CL 107   107 106  --  109  --   --  105  --  105 106  CO2 12*   13* 12*  --  14*  --   --  20*  --  22 24  GLUCOSE 161*   163* 187*  --  113*  --   --  139*  --  90 99  BUN 144*   144* 138*  --  123*  --   --  105*  --  86* 73*  CREATININE 15.35*   15.65* 14.74*  --  13.06*  --   --  10.22*  --  7.68* 5.68*  CALCIUM 8.2*   8.6* 8.7*  --  7.8*  --   --  8.2*  --  7.9* 8.2*  PHOS 9.9*  --   --   --   --   --   --   --   --   --    < > = values in this interval not displayed.   GFR: Estimated Creatinine Clearance: 11.8 mL/min (A) (by C-G formula based on SCr of 5.68 mg/dL (H)). Liver Function Tests: Recent Labs  Lab 06/01/21 0958 06/01/21 1314 06/02/21 0356 06/03/21 0355  06/04/21 0432 06/05/21 0401  AST 18  --  $R'17 21 22 18  'Cj$ ALT 11  --  $R'10 12 15 17  'ew$ ALKPHOS 107  --  68 70 67 58  BILITOT 0.9  --  0.3 0.3 0.4 0.6  PROT 8.7*  --  5.5* 5.7* 5.6* 5.5*  ALBUMIN 4.6 3.8 2.9* 2.9* 2.7* 2.7*   Recent Labs  Lab 06/01/21 0958  LIPASE 604*  No results for input(s): AMMONIA in the last 168 hours. Coagulation Profile: Recent Labs  Lab 06/01/21 0958  INR 1.2   Cardiac Enzymes: No results for input(s): CKTOTAL, CKMB, CKMBINDEX, TROPONINI in the last 168 hours. BNP (last 3 results) No results for input(s): PROBNP in the last 8760 hours. HbA1C: No results for input(s): HGBA1C in the last 72 hours. CBG: Recent Labs  Lab 06/04/21 0823 06/04/21 1236 06/04/21 1740 06/05/21 0823 06/05/21 1200  GLUCAP 98 135* 169* 114* 127*   Lipid Profile: No results for input(s): CHOL, HDL, LDLCALC, TRIG, CHOLHDL, LDLDIRECT in the last 72 hours. Thyroid Function Tests: No results for input(s): TSH, T4TOTAL, FREET4, T3FREE, THYROIDAB in the last 72 hours. Anemia Panel: No results for input(s): VITAMINB12, FOLATE, FERRITIN, TIBC, IRON, RETICCTPCT in the last 72 hours. Sepsis Labs: Recent Labs  Lab 06/01/21 8546  LATICACIDVEN 1.2    Recent Results (from the past 240 hour(s))  Blood culture (routine x 2)     Status: Abnormal   Collection Time: 06/01/21  9:51 AM   Specimen: Left Antecubital; Blood  Result Value Ref Range Status   Specimen Description   Final    LEFT ANTECUBITAL BOTTLES DRAWN AEROBIC AND ANAEROBIC Performed at [facility], [address], [place], [place] [address]    Special Requests   Final    Blood Culture results may not be optimal due to an excessive volume of blood received in culture bottles Performed at [facility], [address] [place], [place] [address]    Culture  Setup Time   Final    GRAM POSITIVE COCCI AEROBIC BOTTLE ONLY Gram Stain Report Called to,Read Back By and Verified With: M. WOOD @ [phone] 06/02/21 BY STEPHTR CRITICAL  RESULT CALLED TO, READ BACK BY AND VERIFIED WITH: K,AMEND PHARMD $RemoveBefore'@1722'[id number]$  06/02/21 EB    Culture (A)  Final    STAPHYLOCOCCUS EPIDERMIDIS THE SIGNIFICANCE OF ISOLATING THIS ORGANISM FROM A SINGLE SET OF BLOOD CULTURES WHEN MULTIPLE SETS ARE DRAWN IS UNCERTAIN. PLEASE NOTIFY THE MICROBIOLOGY DEPARTMENT WITHIN ONE WEEK IF SPECIATION AND SENSITIVITIES ARE REQUIRED. Performed at [facility], [place] [address], [place], [place] [address]    Report Status 06/04/2021 FINAL  Final  Blood Culture ID Panel (Reflexed)     Status: Abnormal   Collection Time: 06/01/21  9:51 AM  Result Value Ref Range Status   Enterococcus faecalis NOT DETECTED NOT DETECTED Final   Enterococcus Faecium NOT DETECTED NOT DETECTED Final   Listeria monocytogenes NOT DETECTED NOT DETECTED Final   Staphylococcus species DETECTED (A) NOT DETECTED Final    Comment: CRITICAL RESULT CALLED TO, READ BACK BY AND VERIFIED WITH: K,AMEND PHARMD $RemoveBefore'@1722'[id number]$  06/02/21 EB    Staphylococcus aureus (BCID) NOT DETECTED NOT DETECTED Final   Staphylococcus epidermidis DETECTED (A) NOT DETECTED Final    Comment: CRITICAL RESULT CALLED TO, READ BACK BY AND VERIFIED WITH: K,AMEND PHARMD $RemoveBefore'@1722'[id number]$  06/02/21 EB    Staphylococcus lugdunensis NOT DETECTED NOT DETECTED Final   Streptococcus species NOT DETECTED NOT DETECTED Final   Streptococcus agalactiae NOT DETECTED NOT DETECTED Final   Streptococcus pneumoniae NOT DETECTED NOT DETECTED Final   Streptococcus pyogenes NOT DETECTED NOT DETECTED Final   A.calcoaceticus-baumannii NOT DETECTED NOT DETECTED Final   Bacteroides fragilis NOT DETECTED NOT DETECTED Final   Enterobacterales NOT DETECTED NOT DETECTED Final   Enterobacter cloacae complex NOT DETECTED NOT DETECTED Final   Escherichia coli NOT DETECTED NOT DETECTED Final   Klebsiella aerogenes NOT DETECTED NOT DETECTED Final  Klebsiella oxytoca NOT DETECTED NOT DETECTED Final   Klebsiella pneumoniae NOT DETECTED NOT DETECTED Final   Proteus species  NOT DETECTED NOT DETECTED Final   Salmonella species NOT DETECTED NOT DETECTED Final   Serratia marcescens NOT DETECTED NOT DETECTED Final   Haemophilus influenzae NOT DETECTED NOT DETECTED Final   Neisseria meningitidis NOT DETECTED NOT DETECTED Final   Pseudomonas aeruginosa NOT DETECTED NOT DETECTED Final   Stenotrophomonas maltophilia NOT DETECTED NOT DETECTED Final   Candida albicans NOT DETECTED NOT DETECTED Final   Candida auris NOT DETECTED NOT DETECTED Final   Candida glabrata NOT DETECTED NOT DETECTED Final   Candida krusei NOT DETECTED NOT DETECTED Final   Candida parapsilosis NOT DETECTED NOT DETECTED Final   Candida tropicalis NOT DETECTED NOT DETECTED Final   Cryptococcus neoformans/gattii NOT DETECTED NOT DETECTED Final   Methicillin resistance mecA/C NOT DETECTED NOT DETECTED Final    Comment: Performed at [facility], [place] [address], [place], [place] [address]  Blood culture (routine x 2)     Status: None (Preliminary result)   Collection Time: 06/01/21  9:56 AM   Specimen: Left Antecubital; Blood  Result Value Ref Range Status   Specimen Description   Final    LEFT ANTECUBITAL BOTTLES DRAWN AEROBIC AND ANAEROBIC   Special Requests Blood Culture adequate volume  Final   Culture   Final    NO GROWTH 4 DAYS Performed at [facility], [address] [place], [place] [address]    Report Status PENDING  Incomplete  Resp Panel by RT-PCR (Flu A&B, Covid) Nasopharyngeal Swab     Status: None   Collection Time: 06/01/21 10:03 AM   Specimen: Nasopharyngeal Swab; Nasopharyngeal(NP) swabs in vial transport medium  Result Value Ref Range Status   SARS Coronavirus 2 by RT PCR NEGATIVE NEGATIVE Final    Comment: (NOTE) SARS-CoV-2 target nucleic acids are NOT DETECTED.  The SARS-CoV-2 RNA is generally detectable in upper respiratory specimens during the acute phase of infection. The lowest concentration of SARS-CoV-2 viral copies this assay can detect is 138 copies/mL.  A negative result does not preclude SARS-Cov-2 infection and should not be used as the sole basis for treatment or other patient management decisions. A negative result may occur with  improper specimen collection/handling, submission of specimen other than nasopharyngeal swab, presence of viral mutation(s) within the areas targeted by this assay, and inadequate number of viral copies(<138 copies/mL). A negative result must be combined with clinical observations, patient history, and epidemiological information. The expected result is Negative.  Fact Sheet for Patients:  EntrepreneurPulse.com.au  Fact Sheet for Healthcare Providers:  IncredibleEmployment.be  This test is no t yet approved or cleared by the [place] FDA and  has been authorized for detection and/or diagnosis of SARS-CoV-2 by FDA under an Emergency Use Authorization (EUA). This EUA will remain  in effect (meaning this test can be used) for the duration of the COVID-19 declaration under Section 564(b)(1) of the Act, 21 U.S.C.section 360bbb-3(b)(1), unless the authorization is terminated  or revoked sooner.       Influenza A by PCR NEGATIVE NEGATIVE Final   Influenza B by PCR NEGATIVE NEGATIVE Final    Comment: (NOTE) The Xpert Xpress SARS-CoV-2/FLU/RSV plus assay is intended as an aid in the diagnosis of influenza from Nasopharyngeal swab specimens and should not be used as a sole basis for treatment. Nasal washings and aspirates are unacceptable for Xpert Xpress SARS-CoV-2/FLU/RSV testing.  Fact Sheet for Patients: EntrepreneurPulse.com.au  Fact  Sheet for Healthcare Providers: IncredibleEmployment.be  This test is not yet approved or cleared by the [place] and has been authorized for detection and/or diagnosis of SARS-CoV-2 by FDA under an Emergency Use Authorization (EUA). This EUA will remain in effect (meaning this test  can be used) for the duration of the COVID-19 declaration under Section 564(b)(1) of the Act, 21 U.S.C. section 360bbb-3(b)(1), unless the authorization is terminated or revoked.  Performed at [facility], [address] [place], [place] [address]   MRSA Next Gen by PCR, Nasal     Status: None   Collection Time: 06/01/21  4:51 PM   Specimen: Nasal Mucosa; Nasal Swab  Result Value Ref Range Status   MRSA by PCR Next Gen NOT DETECTED NOT DETECTED Final    Comment: (NOTE) The GeneXpert MRSA Assay (FDA approved for NASAL specimens only), is one component of a comprehensive MRSA colonization surveillance program. It is not intended to diagnose MRSA infection nor to guide or monitor treatment for MRSA infections. Test performance is not FDA approved in patients less than 61 years old. Performed at [facility], [place] [address], [place], [place] [address]   Urine Culture     Status: None   Collection Time: 06/01/21  6:44 PM   Specimen: In/Out Cath Urine  Result Value Ref Range Status   Specimen Description IN/OUT CATH URINE  Final   Special Requests NONE  Final   Culture   Final    NO GROWTH Performed at [facility], [place] [address] [place],  [address]    Report Status 06/02/2021 FINAL  Final         Radiology Studies: No results found.      Scheduled Meds:  allopurinol  100 mg Oral Daily   amiodarone  200 mg Oral Daily   atorvastatin  80 mg Oral Daily   chlorhexidine  15 mL Mouth Rinse BID   Chlorhexidine Gluconate Cloth  6 each Topical Q0600   diclofenac Sodium  2 g Topical QID   ferrous sulfate  325 mg Oral Q breakfast   folic acid  1 mg Oral Daily   heparin  5,000 Units Subcutaneous Q8H   mouth rinse  15 mL Mouth Rinse q12n4p   metoprolol tartrate  25 mg Oral BID   multivitamin with minerals  1 tablet Oral Daily   pantoprazole  40 mg Oral Daily   sodium chloride flush  3 mL Intravenous Q12H   sodium chloride flush  3 mL Intravenous Q12H    thiamine  100 mg Oral Daily   Continuous Infusions:  sodium chloride Stopped (06/02/21 0953)     LOS: 4 days    Time spent: 34 minutes    Barb Merino, MD [facility] Pager [phone]

## 2021-06-05 NOTE — Consult Note (Signed)
[place] Nurse ostomy consult note ?Patient receiving care in [place] ?Stoma type/location: RLQ ileostomy ?Stomal assessment/size: 1 1/2" pink moist ?Peristomal assessment: intact ?Treatment options for stomal/peristomal skin: barrier ring ?Output: thin yellow ?Ostomy pouching: 2pc. 2 1/4"  ?Education provided: Patient states she is doing very well with changing her pouch and that she has been to the ostomy clinic twice. States she has been changing the pouch 3 times a week. Told her she didn't need to change but twice a week unless she leaks.  ?Enrolled patient in [place] program: Yes previously ?  ?Thank you for the consult. [place] nurse will not follow at this time.   ?Please re-consult the [place] team if needed. ?  ?Cathlean [place]. Tamala Julian, MSN, RN, CMSRN, AGCNS, WTA ?Wound Treatment Associate ?Pager [phone]   ?

## 2021-06-06 LAB — COMPREHENSIVE METABOLIC PANEL
ALT: 22 U/L (ref 0–44)
AST: 20 U/L (ref 15–41)
Albumin: 2.8 g/dL — ABNORMAL LOW (ref 3.5–5.0)
Alkaline Phosphatase: 67 U/L (ref 38–126)
Anion gap: 9 (ref 5–15)
BUN: 64 mg/dL — ABNORMAL HIGH (ref 6–20)
CO2: 24 mmol/L (ref 22–32)
Calcium: 8.8 mg/dL — ABNORMAL LOW (ref 8.9–10.3)
Chloride: 106 mmol/L (ref 98–111)
Creatinine, Ser: 4.4 mg/dL — ABNORMAL HIGH (ref 0.44–1.00)
GFR, Estimated: 11 mL/min — ABNORMAL LOW (ref >60)
Glucose, Bld: 135 mg/dL — ABNORMAL HIGH (ref 70–99)
Potassium: 5.2 mmol/L — ABNORMAL HIGH (ref 3.5–5.1)
Sodium: 139 mmol/L (ref 135–145)
Total Bilirubin: 0.4 mg/dL (ref 0.3–1.2)
Total Protein: 6.1 g/dL — ABNORMAL LOW (ref 6.5–8.1)

## 2021-06-06 LAB — CBC
HCT: 25.4 % — ABNORMAL LOW (ref 36.0–46.0)
Hemoglobin: 8.1 g/dL — ABNORMAL LOW (ref 12.0–15.0)
MCH: 30.7 pg (ref 26.0–34.0)
MCHC: 31.9 g/dL (ref 30.0–36.0)
MCV: 96.2 fL (ref 80.0–100.0)
Platelets: 210 10*3/uL (ref 150–400)
RBC: 2.64 MIL/uL — ABNORMAL LOW (ref 3.87–5.11)
RDW: 15.1 % (ref 11.5–15.5)
WBC: 10.4 10*3/uL (ref 4.0–10.5)
nRBC: 0 % (ref 0.0–0.2)

## 2021-06-06 LAB — GLUCOSE, CAPILLARY
Glucose-Capillary: 109 mg/dL — ABNORMAL HIGH (ref 70–99)
Glucose-Capillary: 163 mg/dL — ABNORMAL HIGH (ref 70–99)

## 2021-06-06 LAB — CULTURE, BLOOD (ROUTINE X 2)
Culture: NO GROWTH
Special Requests: ADEQUATE

## 2021-06-06 MED ORDER — AMLODIPINE BESYLATE 5 MG PO TABS: 5.0000 mg | ORAL_TABLET | Freq: Every day | ORAL | 2 refills | Status: DC

## 2021-06-06 MED ORDER — PREDNISONE 20 MG PO TABS: 40.0000 mg | ORAL_TABLET | Freq: Every day | ORAL | 0 refills | Status: AC

## 2021-06-06 MED ORDER — AMLODIPINE BESYLATE 5 MG PO TABS
5.0000 mg | ORAL_TABLET | Freq: Every day | ORAL | Status: DC
Start: 2021-06-06 — End: 2021-06-06
  Administered 2021-06-06: 5 mg via ORAL
  Filled 2021-06-06: qty 1

## 2021-06-06 MED ORDER — ALLOPURINOL 100 MG PO TABS: 100.0000 mg | ORAL_TABLET | Freq: Every day | ORAL | 0 refills | Status: DC

## 2021-06-06 MED ORDER — PREDNISONE 20 MG PO TABS
40.0000 mg | ORAL_TABLET | Freq: Every day | ORAL | Status: DC
  Administered 2021-06-06: 40 mg via ORAL
  Filled 2021-06-06: qty 2

## 2021-06-06 NOTE — Telephone Encounter (Signed)
Patient d/c from hospital today  ?

## 2021-06-06 NOTE — Discharge Summary (Signed)
Physician Discharge Summary  DARLETTE DUBOW JIR:[id number] DOB: [birth year] DOA: 06/01/2021  PCP: Merryl Hacker, No  Admit date: 06/01/2021 Discharge date: 06/06/2021  Admitted From: Home Disposition: Home with home health PT  Recommendations for Outpatient Follow-up:  Follow up with PCP in 1-2 weeks Please obtain BMP/CBC in one week, [place] [facility] to schedule follow-up at [facility].  Home Health: PT/OT Equipment/Devices: None required  Discharge Condition: Stable CODE STATUS: Full code Diet recommendation: Low-salt diet  Discharge summary: Ms. 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  Poor appetite for about 1 week.  On arrival to the emergency room potassium 6.7, creatinine 17 and bicarbonate of 10.  She was admitted to the hospital with acute renal failure and hyperkalemia.   Assessment & Plan:   Acute Kidney Injury on CKD Stage 3B, Acute severe hyperkalemia with potassium 6.7 and peaked T waves. -Her baseline creatinine around 2, creatinine significantly elevated at 17 on admission, improving, trending down with hydration,  -Most likely prerenal with long history of nausea, vomiting.  -Seen and followed by nephrology. -Renal functions continue to improve.  Recheck in 1 week and will be scheduled by nephrology. -Renal ultrasound was normal.  Foley catheter was removed and voiding trial was successful.   History of Hypertension  -Resume amlodipine 5 mg daily, reduced dose from 10 mg.  Will discontinue chlorthalidone given fluctuating renal functions.   History of A. Fib, paroxysmal  - Continue home Amiodarone, Metoprolol -It was postoperative atrial fibrillation during her hospitalization last December, plan per her primary cardiologist Dr. Skeet Latch to continue  with amiodarone through March 2023, then to stop it and to continue with metoprolol -No evidence of recurrent episodes, so she has not been on anticoagulation.   History of HFpEF - Hypovolemic on examination. Hold off on diuretics   History of Ischemic Colitis s/p hemi-colectomy with Ileostomy (03/2021)  - Ostomy functioning well  - Midline incision with some granulation tissue but no signs of infections.  Local dressing.    Left foot pain and hyperuricemia: -Tender to palpation with tender edema.  X-ray was normal. Uric acid more than 10. 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  Cannot have colchicine with worsening renal functions. Excellent response to prednisone, will continue prednisone 40 mg for 3 more days.  She can use Tylenol, cold compress.  Pain is much improved today. We will start patient on allopurinol.   Patient adequately improved.  Today she was able to bear weight and walk on her foot.  She is on room air.  Successful voiding trial.  Going home with her sister.      Discharge Diagnoses:  Principal Problem:   Acute hyperkalemia Active Problems:   S/P ascending aortic replacement   AKI (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   PAF (paroxysmal atrial fibrillation) (HCC)   Pressure injury of skin    Discharge Instructions  Discharge Instructions     Call MD for:  persistant nausea and vomiting   Complete by: As directed    Diet - low sodium heart healthy   Complete by: As directed    Discharge wound care:   Complete by: As directed    Clean the abdominal wound with NS, then place a moistened saline gauze into the wound, cover with dry gauze and ABD pad, secure with  Medipore tape. Change twice daily   Increase activity slowly   Complete by: As directed       Allergies as of 06/06/2021       Reactions   Chlorthalidone    ELEVATED KIDNEY FUNCTION         Medication List     STOP taking these medications    chlorthalidone 25 MG tablet Commonly known as: HYGROTON    nitroGLYCERIN 0.4 MG SL tablet Commonly known as: NITROSTAT       TAKE these medications    acetaminophen 325 MG tablet Commonly known as: TYLENOL Take 1-2 tablets (325-650 mg total) by mouth every 4 (four) hours as needed for mild pain.   allopurinol 100 MG tablet Commonly known as: ZYLOPRIM Take 1 tablet (100 mg total) by mouth daily. Start taking on: June 07, 2021   amiodarone 200 MG tablet Commonly known as: PACERONE Take 1 tablet (200 mg total) by mouth daily.   amLODipine 5 MG tablet Commonly known as: NORVASC Take 1 tablet (5 mg total) by mouth daily. What changed:  medication strength how much to take   aspirin 81 MG chewable tablet Chew 4 tablets (324 mg total) by mouth daily.   atorvastatin 80 MG tablet Commonly known as: LIPITOR Take 1 tablet (80 mg total) by mouth daily.   Blood Pressure Kit MONITOR YOUR BLOOD PRESSURE TWICE A DAY  DX I10   FeroSul 325 (65 FE) MG tablet Generic drug: ferrous sulfate Take 1 tablet (325 mg total) by mouth daily with breakfast.   methocarbamol 500 MG tablet Commonly known as: ROBAXIN Take 1 tablet (500 mg total) by mouth every 6 (six) hours as needed for muscle spasms.   metoprolol tartrate 25 MG tablet Commonly known as: LOPRESSOR Take 1 tablet (25 mg total) by mouth 2 (two) times daily.   multivitamin with minerals Tabs tablet Take 1 tablet by mouth daily.   oxyCODONE 5 MG immediate release tablet Commonly known as: Oxy IR/ROXICODONE Take 1-2 tablets (5-10 mg total) by mouth every 4 (four) hours as needed for moderate pain.   pantoprazole 40 MG tablet Commonly known as: PROTONIX Take 1 tablet (40 mg total) by mouth daily.   predniSONE 20 MG tablet Commonly known as: DELTASONE Take 2 tablets (40 mg total) by mouth daily for 2 days. Start taking on: June 07, 2021               Discharge Care Instructions  (From admission, onward)           Start     Ordered   06/06/21 0000  Discharge wound  care:       Comments: Clean the abdominal wound with NS, then place a moistened saline gauze into the wound, cover with dry gauze and ABD pad, secure with Medipore tape. Change twice daily   06/06/21 1020            Follow-up Information     [facility]. Go on 06/21/2021.   Why: Appointment with PCP established prior to admission Dr Bettye Boeck information: [address] [place] [address] [phone]        [facility] [place] Follow up in 2 week(s).   Why: We will be scheduling in our [facility] information: [place] [address] [phone]                Allergies  Allergen Reactions   Chlorthalidone  ELEVATED KIDNEY FUNCTION     Consultations: Nephrology   Procedures/Studies: CT ABDOMEN PELVIS WO CONTRAST  Result Date: 06/01/2021 CLINICAL DATA:  Abdominal pain and nausea and vomiting for 1 week. 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 EXAM: CT ABDOMEN AND PELVIS WITHOUT CONTRAST TECHNIQUE: Multidetector CT imaging of the abdomen and pelvis was performed following the standard protocol without IV contrast. RADIATION DOSE REDUCTION: This exam was performed according to the departmental dose-optimization program which includes automated exposure control, adjustment of the mA and/or kV according to patient size and/or use of iterative reconstruction technique. COMPARISON:  04/08/2021 FINDINGS: Lower chest: Mild right lower lobe scarring versus atelectasis. Hepatobiliary: Fluid attenuation cyst is again seen in the caudate lobe measuring 5.5 cm in maximum diameter. No other liver lesions identified on this unenhanced exam. High attenuation sludge is noted within the gallbladder, however there is no evidence of cholecystitis or biliary ductal dilatation. Pancreas: No mass or inflammatory process visualized on this  unenhanced exam. Spleen:  Within normal limits in size. Adrenals/Urinary tract: No evidence of urolithiasis or hydronephrosis. Unremarkable unopacified urinary bladder. Stomach/Bowel: Postop changes are seen from right hemicolectomy with right abdominal ileostomy. No evidence of bowel obstruction, acute inflammatory process, or abnormal fluid collections. No evidence of extraluminal air or abscess. Vascular/Lymphatic: No pathologically enlarged lymph nodes identified. 4.9 cm infrarenal abdominal aortic aneurysm shows no significant change since prior study. No evidence of aneurysm leak or rupture. Reproductive:  No mass or other significant abnormality. Other: Postop changes again seen within the paraumbilical region with 2 tiny paraumbilical hernias which contain only fat. Musculoskeletal:  No suspicious bone lesions identified. IMPRESSION: No acute findings. Stable 4.9 cm infrarenal abdominal aortic aneurysm. Recommend follow-up every 6 months and vascular consultation if not already obtained. This recommendation follows ACR consensus guidelines: White Paper of the ACR Incidental Findings Committee II on Vascular Findings. J Am Coll Radiol 2013; 10:789-794. Gallbladder sludge, without radiographic evidence of cholecystitis. Electronically Signed   By: Marlaine Hind M.D.   On: 06/01/2021 11:09   DG Chest 1 View  Result Date: 06/01/2021 CLINICAL DATA:  Questionable sepsis - evaluate for abnormality EXAM: CHEST  1 VIEW COMPARISON:  05/07/2021 FINDINGS: Stable cardiomediastinal contours. 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 No new consolidation or edema. No pleural effusion or pneumothorax. IMPRESSION: No acute process in the chest. Electronically Signed   By: Macy Mis M.D.   On: 06/01/2021 11:06   DG Chest 2 View  Result Date: 05/07/2021 CLINICAL DATA:  Ascending aortic replacement EXAM: CHEST - 2 VIEW COMPARISON:  04/09/2021 FINDINGS: Mild bibasilar atelectasis. No focal consolidation,  pleural effusion or pneumothorax. Dilated aortic arch. Stable heart size. Prior median sternotomy. No acute osseous abnormality. IMPRESSION: No active cardiopulmonary disease. Electronically Signed   By: Kathreen Devoid M.D.   On: 05/07/2021 14:44   US RENAL  Result Date: 06/01/2021 CLINICAL DATA:  Acute renal failure superimposed on chronic kidney disease stage 4. EXAM: RENAL / URINARY TRACT ULTRASOUND COMPLETE COMPARISON:  Noncontrast abdominopelvic CT earlier today. FINDINGS: Right Kidney: Renal measurements: 11.2 x 5.6 x 5.6 cm = volume: 182 mL. Mild increased renal parenchymal echogenicity. No hydronephrosis. No visualized stone or focal lesion. Left Kidney: Renal measurements: 10.3 x 5.5 x 5.4 cm = volume: 159 mL. Mild increased renal parenchymal echogenicity. Subcentimeter cyst in the lower pole. No hydronephrosis. No stone or solid lesion. Bladder: Distended.  Appears normal for degree of bladder distention. Other: None. IMPRESSION: 1.  No obstructive uropathy. 2. Mild increased renal parenchymal echogenicity typical of chronic medical renal disease. Electronically Signed   By: Keith Rake M.D.   On: 06/01/2021 18:47   DG Foot 2 Views Left  Result Date: 06/03/2021 CLINICAL DATA:  Left foot pain. EXAM: LEFT FOOT - 2 VIEW COMPARISON:  None. FINDINGS: Old fifth metatarsal fracture is noted. Mild hallux valgus deformity of first metatarsophalangeal joint is noted. No acute fracture or dislocation is noted. No soft tissue abnormality is noted. IMPRESSION: Chronic findings as described above.  No acute abnormality seen. Electronically Signed   By: Marijo Conception M.D.   On: 06/03/2021 11:49   US Abdomen Limited RUQ (LIVER/GB)  Result Date: 06/01/2021 CLINICAL DATA:  Pancreatitis EXAM: ULTRASOUND ABDOMEN LIMITED RIGHT UPPER QUADRANT COMPARISON:  CT earlier today FINDINGS: Gallbladder: No gallstones or wall thickening visualized. No sonographic Murphy sign noted by sonographer. Common bile duct:  Diameter: Normal caliber, 5 mm Liver: 4.5 cm cyst in the caudate lobe as seen on CT. Normal echotexture. No biliary ductal dilatation. Portal vein is patent on color Doppler imaging with normal direction of blood flow towards the liver. Other: None. IMPRESSION: 4.5 cm cyst in the caudate lobe. No acute findings. Electronically Signed   By: Rolm Baptise M.D.   On: 06/01/2021 19:22   (Echo, Carotid, EGD, Colonoscopy, ERCP)    Subjective: Patient seen and examined.  She was very excited today because her foot pain has relieved and was able to walk on it.  Denies any other complaints.  Eager to go home.   Discharge Exam: Vitals:   06/06/21 0739 06/06/21 0941  BP: (!) 158/76 (!) 157/87  Pulse:  81  Resp: 17 (!) 23  Temp: 97.9 F (36.6 C)   SpO2: 100% 100%   Vitals:   06/06/21 0500 06/06/21 0600 06/06/21 0739 06/06/21 0941  BP:  (!) 163/82 (!) 158/76 (!) 157/87  Pulse:  (!) 55  81  Resp:  19 17 (!) 23  Temp:   97.9 F (36.6 C)   TempSrc:   Oral   SpO2:  100% 100% 100%  Weight: 87.8 kg     Height:        General: Pt is alert, awake, not in acute distress Cardiovascular: RRR, S1/S2 +, no rubs, no gallops Respiratory: CTA bilaterally, no wheezing, no rhonchi Abdominal: Soft, NT, ND, bowel sounds +, ileostomy with loose stool. Extremities:  1+ edema dorsum of the left foot, nontender today.  No joint swelling or inflammation.    The results of significant diagnostics from this hospitalization (including imaging, microbiology, ancillary and laboratory) are listed below for reference.     Microbiology: Recent Results (from the past 240 hour(s))  Blood culture (routine x 2)     Status: Abnormal   Collection Time: 06/01/21  9:51 AM   Specimen: Left Antecubital; Blood  Result Value Ref Range Status   Specimen Description   Final    LEFT ANTECUBITAL BOTTLES DRAWN AEROBIC AND ANAEROBIC Performed at [facility], [address], [place], [place] [address]    Special Requests   Final     Blood Culture results may not be optimal due to an excessive volume of blood received in culture bottles Performed at [facility], [address], [place],  [address]    Culture  Setup Time   Final    GRAM POSITIVE COCCI AEROBIC BOTTLE ONLY Gram Stain Report Called to,Read Back By and Verified With: M. WOOD @ [phone] 06/02/21 BY  STEPHTR CRITICAL RESULT CALLED TO, READ BACK BY AND VERIFIED WITH: K,AMEND PHARMD $RemoveBefore'@1722'[id number]$  06/02/21 EB    Culture (A)  Final    STAPHYLOCOCCUS EPIDERMIDIS THE SIGNIFICANCE OF ISOLATING THIS ORGANISM FROM A SINGLE SET OF BLOOD CULTURES WHEN MULTIPLE SETS ARE DRAWN IS UNCERTAIN. PLEASE NOTIFY THE MICROBIOLOGY DEPARTMENT WITHIN ONE WEEK IF SPECIATION AND SENSITIVITIES ARE REQUIRED. Performed at [facility], [place] [address], [place], [place] [address]    Report Status 06/04/2021 FINAL  Final  Blood Culture ID Panel (Reflexed)     Status: Abnormal   Collection Time: 06/01/21  9:51 AM  Result Value Ref Range Status   Enterococcus faecalis NOT DETECTED NOT DETECTED Final   Enterococcus Faecium NOT DETECTED NOT DETECTED Final   Listeria monocytogenes NOT DETECTED NOT DETECTED Final   Staphylococcus species DETECTED (A) NOT DETECTED Final    Comment: CRITICAL RESULT CALLED TO, READ BACK BY AND VERIFIED WITH: K,AMEND PHARMD $RemoveBefore'@1722'[id number]$  06/02/21 EB    Staphylococcus aureus (BCID) NOT DETECTED NOT DETECTED Final   Staphylococcus epidermidis DETECTED (A) NOT DETECTED Final    Comment: CRITICAL RESULT CALLED TO, READ BACK BY AND VERIFIED WITH: K,AMEND PHARMD $RemoveBefore'@1722'[id number]$  06/02/21 EB    Staphylococcus lugdunensis NOT DETECTED NOT DETECTED Final   Streptococcus species NOT DETECTED NOT DETECTED Final   Streptococcus agalactiae NOT DETECTED NOT DETECTED Final   Streptococcus pneumoniae NOT DETECTED NOT DETECTED Final   Streptococcus pyogenes NOT DETECTED NOT DETECTED Final   A.calcoaceticus-baumannii NOT DETECTED NOT DETECTED Final   Bacteroides fragilis NOT DETECTED NOT  DETECTED Final   Enterobacterales NOT DETECTED NOT DETECTED Final   Enterobacter cloacae complex NOT DETECTED NOT DETECTED Final   Escherichia coli NOT DETECTED NOT DETECTED Final   Klebsiella aerogenes NOT DETECTED NOT DETECTED Final   Klebsiella oxytoca NOT DETECTED NOT DETECTED Final   Klebsiella pneumoniae NOT DETECTED NOT DETECTED Final   Proteus species NOT DETECTED NOT DETECTED Final   Salmonella species NOT DETECTED NOT DETECTED Final   Serratia marcescens NOT DETECTED NOT DETECTED Final   Haemophilus influenzae NOT DETECTED NOT DETECTED Final   Neisseria meningitidis NOT DETECTED NOT DETECTED Final   Pseudomonas aeruginosa NOT DETECTED NOT DETECTED Final   Stenotrophomonas maltophilia NOT DETECTED NOT DETECTED Final   Candida albicans NOT DETECTED NOT DETECTED Final   Candida auris NOT DETECTED NOT DETECTED Final   Candida glabrata NOT DETECTED NOT DETECTED Final   Candida krusei NOT DETECTED NOT DETECTED Final   Candida parapsilosis NOT DETECTED NOT DETECTED Final   Candida tropicalis NOT DETECTED NOT DETECTED Final   Cryptococcus neoformans/gattii NOT DETECTED NOT DETECTED Final   Methicillin resistance mecA/C NOT DETECTED NOT DETECTED Final    Comment: Performed at [facility], [address] [place], [place] [address]  Blood culture (routine x 2)     Status: None   Collection Time: 06/01/21  9:56 AM   Specimen: Left Antecubital; Blood  Result Value Ref Range Status   Specimen Description   Final    LEFT ANTECUBITAL BOTTLES DRAWN AEROBIC AND ANAEROBIC   Special Requests Blood Culture adequate volume  Final   Culture   Final    NO GROWTH 5 DAYS Performed at [facility], [address], [place], [place] [address]    Report Status 06/06/2021 FINAL  Final  Resp Panel by RT-PCR (Flu A&B, Covid) Nasopharyngeal Swab     Status: None   Collection Time: 06/01/21 10:03 AM   Specimen: Nasopharyngeal Swab; Nasopharyngeal(NP) swabs in vial transport  medium  Result Value  Ref Range Status   SARS Coronavirus 2 by RT PCR NEGATIVE NEGATIVE Final    Comment: (NOTE) SARS-CoV-2 target nucleic acids are NOT DETECTED.  The SARS-CoV-2 RNA is generally detectable in upper respiratory specimens during the acute phase of infection. The lowest concentration of SARS-CoV-2 viral copies this assay can detect is 138 copies/mL. A negative result does not preclude SARS-Cov-2 infection and should not be used as the sole basis for treatment or other patient management decisions. A negative result may occur with  improper specimen collection/handling, submission of specimen other than nasopharyngeal swab, presence of viral mutation(s) within the areas targeted by this assay, and inadequate number of viral copies(<138 copies/mL). A negative result must be combined with clinical observations, patient history, and epidemiological information. The expected result is Negative.  Fact Sheet for Patients:  EntrepreneurPulse.com.au  Fact Sheet for Healthcare Providers:  IncredibleEmployment.be  This test is no t yet approved or cleared by the [place] FDA and  has been authorized for detection and/or diagnosis of SARS-CoV-2 by FDA under an Emergency Use Authorization (EUA). This EUA will remain  in effect (meaning this test can be used) for the duration of the COVID-19 declaration under Section 564(b)(1) of the Act, 21 U.S.C.section 360bbb-3(b)(1), unless the authorization is terminated  or revoked sooner.       Influenza A by PCR NEGATIVE NEGATIVE Final   Influenza B by PCR NEGATIVE NEGATIVE Final    Comment: (NOTE) The Xpert Xpress SARS-CoV-2/FLU/RSV plus assay is intended as an aid in the diagnosis of influenza from Nasopharyngeal swab specimens and should not be used as a sole basis for treatment. Nasal washings and aspirates are unacceptable for Xpert Xpress SARS-CoV-2/FLU/RSV testing.  Fact Sheet for  Patients: EntrepreneurPulse.com.au  Fact Sheet for Healthcare Providers: IncredibleEmployment.be  This test is not yet approved or cleared by the [place] FDA and has been authorized for detection and/or diagnosis of SARS-CoV-2 by FDA under an Emergency Use Authorization (EUA). This EUA will remain in effect (meaning this test can be used) for the duration of the COVID-19 declaration under Section 564(b)(1) of the Act, 21 U.S.C. section 360bbb-3(b)(1), unless the authorization is terminated or revoked.  Performed at [facility], [address], [place], [place] [address]   MRSA Next Gen by PCR, Nasal     Status: None   Collection Time: 06/01/21  4:51 PM   Specimen: Nasal Mucosa; Nasal Swab  Result Value Ref Range Status   MRSA by PCR Next Gen NOT DETECTED NOT DETECTED Final    Comment: (NOTE) The GeneXpert MRSA Assay (FDA approved for NASAL specimens only), is one component of a comprehensive MRSA colonization surveillance program. It is not intended to diagnose MRSA infection nor to guide or monitor treatment for MRSA infections. Test performance is not FDA approved in patients less than 32 years old. Performed at [facility], [place] [address] [place], [place] [address]   Urine Culture     Status: None   Collection Time: 06/01/21  6:44 PM   Specimen: In/Out Cath Urine  Result Value Ref Range Status   Specimen Description IN/OUT CATH URINE  Final   Special Requests NONE  Final   Culture   Final    NO GROWTH Performed at [facility], [place] [address], [place], [place] [address]    Report Status 06/02/2021 FINAL  Final     Labs: BNP (last 3 results) Recent Labs    03/28/21 0358  03/29/21 0214 04/07/21 0851  BNP 898.7* 954.4* 6,761.9*   Basic Metabolic Panel: Recent Labs  Lab 06/01/21 1314 06/01/21 1802 06/02/21 0356 06/02/21 0714 06/03/21 0355 06/03/21 0805 06/04/21 0432 06/05/21 0401 06/06/21 0407  NA  133*   138   < > 138  --  138  --  139 140 139  K 5.9*   6.2*   < > 4.3   < > 3.6 3.8 3.6 4.2 5.2*  CL 107   107   < > 109  --  105  --  105 106 106  CO2 12*   13*   < > 14*  --  20*  --  $R'22 24 24  'GV$ GLUCOSE 161*   163*   < > 113*  --  139*  --  90 99 135*  BUN 144*   144*   < > 123*  --  105*  --  86* 73* 64*  CREATININE 15.35*   15.65*   < > 13.06*  --  10.22*  --  7.68* 5.68* 4.40*  CALCIUM 8.2*   8.6*   < > 7.8*  --  8.2*  --  7.9* 8.2* 8.8*  PHOS 9.9*  --   --   --   --   --   --   --   --    < > = values in this interval not displayed.   Liver Function Tests: Recent Labs  Lab 06/02/21 0356 06/03/21 0355 06/04/21 0432 06/05/21 0401 06/06/21 0407  AST $Re'17 21 22 18 20  'CSo$ ALT $R'10 12 15 17 22  'TS$ ALKPHOS 68 70 67 58 67  BILITOT 0.3 0.3 0.4 0.6 0.4  PROT 5.5* 5.7* 5.6* 5.5* 6.1*  ALBUMIN 2.9* 2.9* 2.7* 2.7* 2.8*   Recent Labs  Lab 06/01/21 0958  LIPASE 604*   No results for input(s): AMMONIA in the last 168 hours. CBC: Recent Labs  Lab 06/02/21 0356 06/03/21 0355 06/04/21 0432 06/05/21 0401 06/06/21 0407  WBC 6.5 8.6 8.1 8.1 10.4  HGB 8.4* 8.6* 8.1* 7.9* 8.1*  HCT 26.6* 27.0* 25.0* 24.5* 25.4*  MCV 98.5 95.7 95.1 95.0 96.2  PLT 218 235 229 220 210   Cardiac Enzymes: No results for input(s): CKTOTAL, CKMB, CKMBINDEX, TROPONINI in the last 168 hours. BNP: Invalid input(s): POCBNP CBG: Recent Labs  Lab 06/05/21 0823 06/05/21 1200 06/05/21 1620 06/05/21 2211 06/06/21 0742  GLUCAP 114* 127* 230* 186* 109*   D-Dimer No results for input(s): DDIMER in the last 72 hours. Hgb A1c No results for input(s): HGBA1C in the last 72 hours. Lipid Profile No results for input(s): CHOL, HDL, LDLCALC, TRIG, CHOLHDL, LDLDIRECT in the last 72 hours. Thyroid function studies No results for input(s): TSH, T4TOTAL, T3FREE, THYROIDAB in the last 72 hours.  Invalid input(s): FREET3 Anemia work up No results for input(s): VITAMINB12, FOLATE, FERRITIN, TIBC, IRON, RETICCTPCT in the  last 72 hours. Urinalysis    Component Value Date/Time   COLORURINE YELLOW 06/01/2021 1844   APPEARANCEUR CLEAR 06/01/2021 1844   LABSPEC 1.010 06/01/2021 1844   PHURINE 5.0 06/01/2021 1844   GLUCOSEU NEGATIVE 06/01/2021 1844   HGBUR NEGATIVE 06/01/2021 1844   BILIRUBINUR NEGATIVE 06/01/2021 1844   KETONESUR NEGATIVE 06/01/2021 1844   PROTEINUR NEGATIVE 06/01/2021 1844   UROBILINOGEN 0.2 05/26/2008 1446   NITRITE NEGATIVE 06/01/2021 1844   LEUKOCYTESUR NEGATIVE 06/01/2021 1844   Sepsis Labs Invalid input(s): PROCALCITONIN,  WBC,  LACTICIDVEN Microbiology Recent Results (from the past 240 hour(s))  Blood  culture (routine x 2)     Status: Abnormal   Collection Time: 06/01/21  9:51 AM   Specimen: Left Antecubital; Blood  Result Value Ref Range Status   Specimen Description   Final    LEFT ANTECUBITAL BOTTLES DRAWN AEROBIC AND ANAEROBIC Performed at [facility], [address], [place], [place] [address]    Special Requests   Final    Blood Culture results may not be optimal due to an excessive volume of blood received in culture bottles Performed at [facility], [address], [place], [place] [address]    Culture  Setup Time   Final    GRAM POSITIVE COCCI AEROBIC BOTTLE ONLY Gram Stain Report Called to,Read Back By and Verified With: M. WOOD @ [phone] 06/02/21 BY STEPHTR CRITICAL RESULT CALLED TO, READ BACK BY AND VERIFIED WITH: K,AMEND PHARMD $RemoveBefore'@1722'[id number]$  06/02/21 EB    Culture (A)  Final    STAPHYLOCOCCUS EPIDERMIDIS THE SIGNIFICANCE OF ISOLATING THIS ORGANISM FROM A SINGLE SET OF BLOOD CULTURES WHEN MULTIPLE SETS ARE DRAWN IS UNCERTAIN. PLEASE NOTIFY THE MICROBIOLOGY DEPARTMENT WITHIN ONE WEEK IF SPECIATION AND SENSITIVITIES ARE REQUIRED. Performed at [facility], [place] [address], [place], [place] [address]    Report Status 06/04/2021 FINAL  Final  Blood Culture ID Panel (Reflexed)     Status: Abnormal   Collection Time: 06/01/21  9:51 AM  Result Value Ref Range Status    Enterococcus faecalis NOT DETECTED NOT DETECTED Final   Enterococcus Faecium NOT DETECTED NOT DETECTED Final   Listeria monocytogenes NOT DETECTED NOT DETECTED Final   Staphylococcus species DETECTED (A) NOT DETECTED Final    Comment: CRITICAL RESULT CALLED TO, READ BACK BY AND VERIFIED WITH: K,AMEND PHARMD $RemoveBefore'@1722'[id number]$  06/02/21 EB    Staphylococcus aureus (BCID) NOT DETECTED NOT DETECTED Final   Staphylococcus epidermidis DETECTED (A) NOT DETECTED Final    Comment: CRITICAL RESULT CALLED TO, READ BACK BY AND VERIFIED WITH: K,AMEND PHARMD $RemoveBefore'@1722'[id number]$  06/02/21 EB    Staphylococcus lugdunensis NOT DETECTED NOT DETECTED Final   Streptococcus species NOT DETECTED NOT DETECTED Final   Streptococcus agalactiae NOT DETECTED NOT DETECTED Final   Streptococcus pneumoniae NOT DETECTED NOT DETECTED Final   Streptococcus pyogenes NOT DETECTED NOT DETECTED Final   A.calcoaceticus-baumannii NOT DETECTED NOT DETECTED Final   Bacteroides fragilis NOT DETECTED NOT DETECTED Final   Enterobacterales NOT DETECTED NOT DETECTED Final   Enterobacter cloacae complex NOT DETECTED NOT DETECTED Final   Escherichia coli NOT DETECTED NOT DETECTED Final   Klebsiella aerogenes NOT DETECTED NOT DETECTED Final   Klebsiella oxytoca NOT DETECTED NOT DETECTED Final   Klebsiella pneumoniae NOT DETECTED NOT DETECTED Final   Proteus species NOT DETECTED NOT DETECTED Final   Salmonella species NOT DETECTED NOT DETECTED Final   Serratia marcescens NOT DETECTED NOT DETECTED Final   Haemophilus influenzae NOT DETECTED NOT DETECTED Final   Neisseria meningitidis NOT DETECTED NOT DETECTED Final   Pseudomonas aeruginosa NOT DETECTED NOT DETECTED Final   Stenotrophomonas maltophilia NOT DETECTED NOT DETECTED Final   Candida albicans NOT DETECTED NOT DETECTED Final   Candida auris NOT DETECTED NOT DETECTED Final   Candida glabrata NOT DETECTED NOT DETECTED Final   Candida krusei NOT DETECTED NOT DETECTED Final   Candida parapsilosis NOT  DETECTED NOT DETECTED Final   Candida tropicalis NOT DETECTED NOT DETECTED Final   Cryptococcus neoformans/gattii NOT DETECTED NOT DETECTED Final   Methicillin resistance mecA/C NOT DETECTED NOT DETECTED Final    Comment: Performed at [facility], [place] [address], [place], [place] [address]  Blood culture (routine x 2)     Status: None   Collection Time: 06/01/21  9:56 AM   Specimen: Left Antecubital; Blood  Result Value Ref Range Status   Specimen Description   Final    LEFT ANTECUBITAL BOTTLES DRAWN AEROBIC AND ANAEROBIC   Special Requests Blood Culture adequate volume  Final   Culture   Final    NO GROWTH 5 DAYS Performed at [facility], [address] [place], [place] [address]    Report Status 06/06/2021 FINAL  Final  Resp Panel by RT-PCR (Flu A&B, Covid) Nasopharyngeal Swab     Status: None   Collection Time: 06/01/21 10:03 AM   Specimen: Nasopharyngeal Swab; Nasopharyngeal(NP) swabs in vial transport medium  Result Value Ref Range Status   SARS Coronavirus 2 by RT PCR NEGATIVE NEGATIVE Final    Comment: (NOTE) SARS-CoV-2 target nucleic acids are NOT DETECTED.  The SARS-CoV-2 RNA is generally detectable in upper respiratory specimens during the acute phase of infection. The lowest concentration of SARS-CoV-2 viral copies this assay can detect is 138 copies/mL. A negative result does not preclude SARS-Cov-2 infection and should not be used as the sole basis for treatment or other patient management decisions. A negative result may occur with  improper specimen collection/handling, submission of specimen other than nasopharyngeal swab, presence of viral mutation(s) within the areas targeted by this assay, and inadequate number of viral copies(<138 copies/mL). A negative result must be combined with clinical observations, patient history, and epidemiological information. The expected result is Negative.  Fact Sheet for Patients:   EntrepreneurPulse.com.au  Fact Sheet for Healthcare Providers:  IncredibleEmployment.be  This test is no t yet approved or cleared by the [place] FDA and  has been authorized for detection and/or diagnosis of SARS-CoV-2 by FDA under an Emergency Use Authorization (EUA). This EUA will remain  in effect (meaning this test can be used) for the duration of the COVID-19 declaration under Section 564(b)(1) of the Act, 21 U.S.C.section 360bbb-3(b)(1), unless the authorization is terminated  or revoked sooner.       Influenza A by PCR NEGATIVE NEGATIVE Final   Influenza B by PCR NEGATIVE NEGATIVE Final    Comment: (NOTE) The Xpert Xpress SARS-CoV-2/FLU/RSV plus assay is intended as an aid in the diagnosis of influenza from Nasopharyngeal swab specimens and should not be used as a sole basis for treatment. Nasal washings and aspirates are unacceptable for Xpert Xpress SARS-CoV-2/FLU/RSV testing.  Fact Sheet for Patients: EntrepreneurPulse.com.au  Fact Sheet for Healthcare Providers: IncredibleEmployment.be  This test is not yet approved or cleared by the [place] FDA and has been authorized for detection and/or diagnosis of SARS-CoV-2 by FDA under an Emergency Use Authorization (EUA). This EUA will remain in effect (meaning this test can be used) for the duration of the COVID-19 declaration under Section 564(b)(1) of the Act, 21 U.S.C. section 360bbb-3(b)(1), unless the authorization is terminated or revoked.  Performed at [facility], [address] [place], [place] [address]   MRSA Next Gen by PCR, Nasal     Status: None   Collection Time: 06/01/21  4:51 PM   Specimen: Nasal Mucosa; Nasal Swab  Result Value Ref Range Status   MRSA by PCR Next Gen NOT DETECTED NOT DETECTED Final    Comment: (NOTE) The GeneXpert MRSA Assay (FDA approved for NASAL specimens only), is one component of a  comprehensive MRSA colonization surveillance program. It is not intended  to diagnose MRSA infection nor to guide or monitor treatment for MRSA infections. Test performance is not FDA approved in patients less than 2 years old. Performed at [facility], [place] [address] [place], [place] [address]   Urine Culture     Status: None   Collection Time: 06/01/21  6:44 PM   Specimen: In/Out Cath Urine  Result Value Ref Range Status   Specimen Description IN/OUT CATH URINE  Final   Special Requests NONE  Final   Culture   Final    NO GROWTH Performed at [facility], [place] [address], [place], [place] [address]    Report Status 06/02/2021 FINAL  Final     Time coordinating discharge:  35 minutes  SIGNED:   Barb Merino, MD  [facility] 06/06/2021, 11:48 AM

## 2021-06-06 NOTE — TOC Transition Note (Signed)
Transition of Care (TOC) - CM/SW Discharge Note ? ? ?Patient Details  ?Name: Dorothy Harris ?MRN: [id number] ?Date of Birth: 1960-11-22 ? ?Transition of Care (TOC) CM/SW Contact:  ?Cyndi Bender, RN ?Phone Number: ?06/06/2021, 1:24 PM ? ? ?Clinical Narrative:    ?Patient stable for discharge. Orders for Home health PT. Unable to find a home health agency that will accept her insurance. ?Attempted to get her outpt therapy. Called the outpt therapy in-network with her insurance and none of that are able to accept her due to her age. Patient and MD made aware. ?Sister at bedside to transport home ? ? ?Final next level of care: Home/Self Care ?Barriers to Discharge: Barriers Resolved ? ? ?Patient Goals and CMS Choice ?Patient states their goals for this hospitalization and ongoing recovery are:: return home ?  ?  ? ?Discharge Placement ?  ?           ? home ?  ?  ?  ? ?Discharge Plan and Services ?  ?Discharge Planning Services: CM Consult ?           ?  ?  ?  ?  ?  ?  ?  ?  ?  ?  ? ?Social Determinants of Health (SDOH) Interventions ?  ? ? ?Readmission Risk Interventions ?Readmission Risk Prevention Plan 06/06/2021 04/15/2021  ?Transportation Screening Complete (No Data)  ?PCP or Specialist Appt within 5-7 Days - (No Data)  ?Home Care Screening - Complete  ?Medication Review (RN CM) - Complete  ?Medication Review Press photographer) Complete -  ?PCP or Specialist appointment within 3-5 days of discharge Complete -  ?[place] or Home Care Consult Complete -  ?Palliative Care Screening Not Applicable -  ?[place] Not Applicable -  ?Some recent data might be hidden  ? ? ? ? ? ?

## 2021-06-06 NOTE — TOC Progression Note (Signed)
Transition of Care (TOC) - Progression Note  ? ? ?Patient Details  ?Name: Dorothy Harris ?MRN: [id number] ?Date of Birth: 06/17/1960 ? ?Transition of Care (TOC) CM/SW Contact  ?Merlen Gurry D Laken Rog, Student-Social Work ?Phone Number: ?06/06/2021, 11:47 AM ? ?Clinical Narrative:    ?CSW Intern visited patient at bedside while her sister from [place] was in the room. The patient stated it was okay to talk while her sister remained in the room. CSW Intern provided resources and discussed any needs for discharge. The patient stated they have a Dorothy Landgrebe at home, transportation to appointments and friends to come and check on them. The patients youngest sister lives in [place], [place] and will visit every other weekend. CSW provided support and answered any questions the patient had. ? ?Expected Discharge Plan: [place] ?Barriers to Discharge: Continued Medical Work up ? ?Expected Discharge Plan and Services ?Expected Discharge Plan: [place] ?  ?Discharge Planning Services: CM Consult ?  ?Living arrangements for the past 2 months: [place] ?Expected Discharge Date: 06/06/21               ?  ?  ?  ?  ?  ?  ?  ?  ?  ?  ? ? ?Social Determinants of Health (SDOH) Interventions ?  ? ?Readmission Risk Interventions ?Readmission Risk Prevention Plan 04/15/2021  ?Transportation Screening (No Data)  ?PCP or Specialist Appt within 5-7 Days (No Data)  ?Home Care Screening Complete  ?Medication Review (RN CM) Complete  ?Some recent data might be hidden  ? ? ?

## 2021-06-06 NOTE — Progress Notes (Signed)
Occupational Therapy Treatment ?Patient Details ?Name: Dorothy Harris ?MRN: [id number] ?DOB: 10/17/1960 ?Today's Date: 06/06/2021 ? ? 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 Hyperkalemia, acute on chronic kidney disease, acute pancreatitis; ?  ?OT comments ? Pt denies pain today and was agreeable to ADL retraining session with focus on functional mobility (ambulated from room to nursing station and back to room) at supervision level, transfers Mod I-supervision for safety, grooming standing at sink and LB dressing seated and sit to stand. Pt showing increased activity tolerance and was overall mod I - supervision level for safety overall. Pt is progressing toward plan of care and OT goals. She states that she plans to d/c home with family PRN assist later today if able.  ? ?Recommendations for follow up therapy are one component of a multi-disciplinary discharge planning process, led by the attending physician.  Recommendations may be updated based on patient status, additional functional criteria and insurance authorization. ?   ?Follow Up Recommendations ? No OT follow up  ?  ?Assistance Recommended at Discharge Intermittent Supervision/Assistance  ?Patient can return home with the following ? Assistance with cooking/housework;Assist for transportation ?  ?Equipment Recommendations ? None recommended by OT  ?  ?Recommendations for Other Services   ? ?  ?Precautions / Restrictions Precautions ?Precautions: Fall  ? ? ?  ? ?Mobility Bed Mobility ?  ?  ?General bed mobility comments: Recieved up the chair ?  ? ?Transfers ?Overall transfer level: Needs assistance ?Equipment used: Rolling walker (2 wheels) ?Transfers: Sit to/from Stand ?Sit to Stand: Supervision ?  ?  ?  ?Balance Overall  balance assessment: Mild deficits observed, not formally tested ?  ?   ? ?ADL either performed or assessed with clinical judgement  ? ?ADL Overall ADL's : Needs assistance/impaired ?Eating/Feeding: Sitting;Independent ?  ?Grooming: Wash/dry hands;Wash/dry face;Standing;Oral care;Modified independent ?Grooming Details (indicate cue type and reason): Pt stood at sink for grooming at Mod I level today ?  ?Upper Body Bathing Details (indicate cue type and reason): Pr reports that she already bathed this morning ?  ?  ?Lower Body Dressing: Sitting/lateral leans;Sit to/from stand;Modified independent;Supervision/safety ?Lower Body Dressing Details (indicate cue type and reason): Mod I for don/doff socks in sitting, supervision for sit to stand from chair ?Toilet Transfer: Regular Toilet;Rolling walker (2 wheels);Modified Independent;Supervision/safety ?Toilet Transfer Details (indicate cue type and reason): Simulated toilet transfer at supervision level, no hands on assist. Sit to stand from chair x2 ?  ?  ?Functional mobility during ADLs: Supervision/safety;Rolling walker (2 wheels) ?General ADL Comments: Pt denies pain today and was agreeable to ADL retraining session with focus on functional mobility (ambulated from room to nursing station and back to room) at supervision level, transfers Mod I - supervision for safety, grooming standing at sink and LB dressing seated and sit to stand. Pt showing increased activity tolerance and was overall mod I - supervision level for safety overall. Pt states that she plans to d/c home with family PRN assist later today if able. ?  ? ?Extremity/Trunk Assessment Upper Extremity Assessment ?Upper Extremity Assessment: Overall WFL for tasks assessed;Generalized weakness ?  ?Lower Extremity Assessment ?Lower Extremity Assessment: Defer to PT evaluation ?  ?Cervical / Trunk Assessment ?Cervical / Trunk Assessment: Normal ?  ? ?Vision   ?Vision Assessment?: No  apparent visual deficits ?   ?   ?   ? ?Cognition Arousal/Alertness: Awake/alert ?Behavior During Therapy: [facility] for tasks assessed/performed ?  ?  ?   ?   ?   ?General Comments  Pt plans to d/c home later today w/ family PRN assist if able.  ? ? ?Pertinent Vitals/ Pain       Pain Assessment ?Pain Assessment: No/denies pain ?Faces Pain Scale: No hurt ? ?Home Living  Refer to initial OT Eval ?  ? ?  ?Prior Functioning/Environment   Refer to Eval - Mod I  ?   ? ?Frequency ? Min 2X/week  ? ? ? ? ?  ?Progress Toward Goals ? ?OT Goals(current goals can now be found in the care plan section) ? Progress towards OT goals: Progressing toward goals ? ?Acute Rehab OT Goals ?Patient Stated Goal: To go home today ?OT Goal Formulation: With patient ?Time For Goal Achievement: 06/18/21 ?Potential to Achieve Goals: Good  ?Plan Discharge plan remains appropriate   ? ?AM-PAC OT "6 Clicks" Daily Activity     ?Outcome Measure ? ? Help from another person eating meals?: None ?Help from another person taking care of personal grooming?: None ?Help from another person toileting, which includes using toliet, bedpan, or urinal?: None ?Help from another person bathing (including washing, rinsing, drying)?: A Little ?  ?Help from another person to put on and taking off regular lower body clothing?: A Little ?6 Click Score: 18 ? ?  ?End of Session Equipment Utilized During Treatment: Rolling walker (2 wheels);Gait belt ? ?OT Visit Diagnosis: Unsteadiness on feet (R26.81) ?  ?Activity Tolerance Patient tolerated treatment well ?  ?Patient Left in chair;with call bell/phone within reach;with family/visitor present ?  ?Nurse Communication Mobility status;Other (comment) (ADL - grooming standing at sink and sit to stand LB dressing) ?  ? ?Time: 0937-1000 ?OT Time Calculation (min): 23 min ? ?Charges: OT General Charges ?$OT Visit: 1 Visit ?OT Treatments ?$Self Care/Home Management : 8-22 mins ?$Therapeutic Activity: 8-22 mins ? ? ?Percell Miller Beth Dixon, OTR/L ?06/06/2021,  10:15 AM ? ? ?

## 2021-06-06 NOTE — Plan of Care (Signed)
?  Problem: Health Behavior/Discharge Planning: ?Goal: Ability to manage health-related needs will improve ?Outcome: Adequate for Discharge ? Patient discharged to go home ? ?

## 2021-06-10 ENCOUNTER — Ambulatory Visit: Payer: PRIVATE HEALTH INSURANCE | Admitting: Nurse Practitioner

## 2021-06-12 ENCOUNTER — Encounter: Payer: Self-pay | Admitting: Surgery

## 2021-06-12 ENCOUNTER — Ambulatory Visit (INDEPENDENT_AMBULATORY_CARE_PROVIDER_SITE_OTHER): Payer: Self-pay | Admitting: Surgery

## 2021-06-12 ENCOUNTER — Other Ambulatory Visit: Payer: Self-pay

## 2021-06-12 VITALS — BP 140/84 | HR 72 | Resp 20 | Ht 67.0 in | Wt 190.0 lb

## 2021-06-12 DIAGNOSIS — Z95828 Presence of other vascular implants and grafts: Secondary | ICD-10-CM

## 2021-06-12 NOTE — Progress Notes (Signed)
? ? ? ?HPI: ?Patient returns for routine postoperative follow-up having undergone supra-coronary replacement of the ascending aorta and aortic arch under deep hypothermic circulatory arrest with bypass of the innominate, left common carotid, and left subclavian arteries with right axillary artery cannulation for an acute type a aortic dissection with contained rupture and hemopericardium on 03/25/2021. 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  She gradually recovered and was discharged to inpatient rehab at [facility] on 04/12/2021. ?Since hospital discharge the patient reports that she has been slowly recovering.  She reports some shortness of breath with exertion.  She was recently admitted to [facility] from 2/25 to 06/06/2021 for acute kidney injury on CKD stage IIIb with severe hyperkalemia to 6.7 and peaked T waves felt to be due to some nausea and vomiting with dehydration.  Her creatinine rose to 17 and improved with hydration.  She is back at home now and has not required dialysis.  She is walking using a walker.  She denies any chest or back pain. ? ? ?Current Outpatient Medications  ?Medication Sig Dispense Refill  ? acetaminophen (TYLENOL) 325 MG tablet Take 1-2 tablets (325-650 mg total) by mouth every 4 (four) hours as needed for mild pain.    ? allopurinol (ZYLOPRIM) 100 MG tablet Take 1 tablet (100 mg total) by mouth daily. 30 tablet 0  ? amiodarone (PACERONE) 200 MG tablet Take 1 tablet (200 mg total) by mouth daily. 90 tablet 2  ? amLODipine (NORVASC) 5 MG tablet Take 1 tablet (5 mg total) by mouth daily. 30 tablet 2  ? aspirin 81 MG chewable tablet Chew 4 tablets (324 mg total) by mouth daily.    ? atorvastatin (LIPITOR) 80 MG tablet Take 1 tablet (80 mg total) by  mouth daily. 90 tablet 2  ? Blood Pressure KIT MONITOR YOUR BLOOD PRESSURE TWICE A DAY  ?DX I10 1 kit 0  ? ferrous sulfate 325 (65 FE) MG tablet Take 1 tablet (325 mg total) by mouth daily with breakfast. 30 tablet 0  ? methocarbamol (ROBAXIN) 500 MG tablet Take 1 tablet (500 mg total) by mouth every 6 (six) hours as needed for muscle spasms. 60 tablet 0  ? metoprolol tartrate (LOPRESSOR) 25 MG tablet Take 1 tablet (25 mg total) by mouth 2 (two) times daily. 180 tablet 2  ? Multiple Vitamin (MULTIVITAMIN WITH MINERALS) TABS tablet Take 1 tablet by mouth daily.    ? oxyCODONE (OXY IR/ROXICODONE) 5 MG immediate release tablet Take 1-2 tablets (5-10 mg total) by mouth every 4 (four) hours as needed for moderate pain. 30 tablet 0  ? pantoprazole (PROTONIX) 40 MG tablet Take 1 tablet (40 mg total) by mouth daily. 30 tablet 3  ? ?No current facility-administered medications for this visit.  ? ? ?Physical Exam: ?BP 140/84   Pulse 72   Resp 20   Ht $R'5\' 7"'ug$  (1.702 m)   Wt 190 lb (86.2 kg)   SpO2 100% Comment: RA  BMI 29.76 kg/m?  ?She looks well considering what she has been through. ?Cardiac exam shows regular rate and rhythm with a 2/6 systolic murmur along the right sternal border.  There is no diastolic murmur. ?Lungs are clear. ?The chest incision is well-healed and the sternum is stable. ?Radial pulses are palpable bilaterally. ?There is no peripheral edema. ? ?  Diagnostic Tests: ? ?None today ? ?Impression: ? 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  She will be 3 months out from her surgery and 1 more week and then there is no restriction as far as sternal precautions.  I encouraged her to continue walking as much possible to build up stamina.  I have recommended that she have a follow-up CT of the chest and abdomen in 6 months without contrast to reexamine the size of her  residual aorta.  Without contrast it will not tell us about the aortic dissection but should give [place] information about the size of her aorta.  The risk of further kidney injury from contrast is greater than the benefit unless she develops an acute problem related to her aortic dissection. ? ?Plan: ? ?I will see her back in 6 months with a CT scan of the chest and abdomen without contrast. ? ? ? ? ?Gaye Pollack, MD ?[facility] ?[phone] ? ?

## 2021-06-21 ENCOUNTER — Ambulatory Visit (INDEPENDENT_AMBULATORY_CARE_PROVIDER_SITE_OTHER): Payer: PRIVATE HEALTH INSURANCE | Admitting: Nurse Practitioner

## 2021-06-21 ENCOUNTER — Encounter: Payer: Self-pay | Admitting: Nurse Practitioner

## 2021-06-21 ENCOUNTER — Telehealth: Payer: Self-pay | Admitting: Nurse Practitioner

## 2021-06-21 ENCOUNTER — Other Ambulatory Visit: Payer: Self-pay

## 2021-06-21 VITALS — BP 129/83 | HR 70 | Ht 67.0 in | Wt 180.0 lb

## 2021-06-21 DIAGNOSIS — N179 Acute kidney failure, unspecified: Secondary | ICD-10-CM | POA: Diagnosis not present

## 2021-06-21 DIAGNOSIS — I2510 Atherosclerotic heart disease of native coronary artery without angina pectoris: Secondary | ICD-10-CM

## 2021-06-21 DIAGNOSIS — I2583 Coronary atherosclerosis due to lipid rich plaque: Secondary | ICD-10-CM

## 2021-06-21 DIAGNOSIS — F321 Major depressive disorder, single episode, moderate: Secondary | ICD-10-CM | POA: Diagnosis not present

## 2021-06-21 DIAGNOSIS — Z0271 Encounter for disability determination: Secondary | ICD-10-CM | POA: Diagnosis not present

## 2021-06-21 DIAGNOSIS — N6310 Unspecified lump in the right breast, unspecified quadrant: Secondary | ICD-10-CM | POA: Insufficient documentation

## 2021-06-21 DIAGNOSIS — F325 Major depressive disorder, single episode, in full remission: Secondary | ICD-10-CM | POA: Insufficient documentation

## 2021-06-21 DIAGNOSIS — Z9889 Other specified postprocedural states: Secondary | ICD-10-CM | POA: Insufficient documentation

## 2021-06-21 DIAGNOSIS — I10 Essential (primary) hypertension: Secondary | ICD-10-CM

## 2021-06-21 DIAGNOSIS — Z95828 Presence of other vascular implants and grafts: Secondary | ICD-10-CM

## 2021-06-21 MED ORDER — PANTOPRAZOLE SODIUM 40 MG PO TBEC: 40.0000 mg | DELAYED_RELEASE_TABLET | Freq: Every day | ORAL | 3 refills | Status: DC

## 2021-06-21 NOTE — Assessment & Plan Note (Signed)
On aspirin 81 mg daily, atorvastatin 80 mg daily, LDL at goal.  ?Lab Results  ?Component Value Date  ? CHOL 94 06/01/2021  ? HDL 22 (L) 06/01/2021  ? [place] 28 06/01/2021  ? TRIG 218 (H) 06/01/2021  ? CHOLHDL 4.3 06/01/2021  ? ?

## 2021-06-21 NOTE — Assessment & Plan Note (Signed)
Mammogram done in 2019 showed right breast, a possible masses warranting further evaluation.Marland Kitchen ?Diagnostic mammogram ordered today.  ?

## 2021-06-21 NOTE — Assessment & Plan Note (Addendum)
PHQ-9 score 10, patient denies SI, HI patient states that she is worried about how she is going to be paying her bills.  Patient agrees for referral to therapy for counseling and possible assistance with community resources.  ?Not ready to start medications today.  ?

## 2021-06-21 NOTE — Patient Instructions (Addendum)
You have been referred for therapy and counseling for your depression.  ? ?Please get your covid vaccine, shingles vaccine at your pharmacy.  ? ?Please stop taking allopurinol.  ? ? ?It is important that you exercise regularly at least 30 minutes 5 times a week.  ?Think about what you will eat, plan ahead. ?Choose " clean, green, fresh or frozen" over canned, processed or packaged foods which are more sugary, salty and fatty. ?70 to 75% of food eaten should be vegetables and fruit. ?Three meals at set times with snacks allowed between meals, but they must be fruit or vegetables. ?Aim to eat over a 12 hour period , example 7 am to 7 pm, and STOP after  your last meal of the day. ?Drink water,generally about 64 ounces per day, no other drink is as healthy. Fruit juice is best enjoyed in a healthy way, by EATING the fruit. ? ?Thanks for choosing [facility], we consider it a privelige to serve you. ? ?

## 2021-06-21 NOTE — Telephone Encounter (Signed)
Please advise 

## 2021-06-21 NOTE — Assessment & Plan Note (Signed)
Had ascending aortic replacement surgery 3 months ago ?Followed up with cardiology 2 weeks ago, patient noted to be making good progress after her surgery, no sternal precautions indicated. ?Reports that she has been doing exercises at home but does not feel strong enough to go back to work.  Using a walker today.  Patient encouraged to continue exercising at home. ?Patient encouraged to maintain close follow-up with cardiology. ? ?

## 2021-06-21 NOTE — Telephone Encounter (Signed)
Colletta Maryland (sister) called in on patient behalf. ? ?States that patient is upset after visit 3/17. ? ?Wants a call back to discuss ? ?Delman Cheadle  ?[phone] ?

## 2021-06-21 NOTE — Assessment & Plan Note (Signed)
BP Readings from Last 3 Encounters:  ?06/21/21 129/83  ?06/12/21 140/84  ?06/06/21 (!) 159/83  ?Condition well-controlled on amiodarone 200 mg daily, amlodipine 5 mg daily metoprolol 25 mg twice daily ?Continue current medication, exercise as tolerated avoid salts ?

## 2021-06-21 NOTE — Assessment & Plan Note (Addendum)
Status post hemicolectomy, ileostomy creation with ventral hernia repair on 04/02/2021. ?Patient states that she does dressing changes by herself at home. ?Mid abdominal wound clean and moist no sign of infection noted on examination today.  Ileostomy pink and moist no sign of infection noted. ?Pt encouraged to maintain close follow-up with surgery.  ?
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
## 2021-06-21 NOTE — Assessment & Plan Note (Signed)
Patient would like disability forms completed due to not feeling strong enough to go back to work.  I discussed with patient that she will need a PT eval before form can be completed.  I discussed with patient to follow-up with nephrology on Monday as planned after nephrology appointment, will call patient to find out what  her plan of treatment as per her CKD is, will schedule physical therapy evaluation to determine if she  needs  to continue to stay away from work.  Patient left the office upset about this plan.  ?

## 2021-06-21 NOTE — Assessment & Plan Note (Addendum)
Has an upcoming appointment at [facility] on Monday. ?CBC, BMP ordered, will notify patient to get labs done on Monday. ?Patient states that she still makes urine ?Avoid nephrotoxic agents , allopurinol 100 mg daily discontinued today, no complaints of gout.   ?

## 2021-06-24 NOTE — Telephone Encounter (Signed)
Called pt advised of Fola's message, pt states that she doesn't have insurance and she has an appt today at the kidney doctor. ?

## 2021-06-25 ENCOUNTER — Encounter: Payer: Self-pay | Admitting: Nurse Practitioner

## 2021-06-25 ENCOUNTER — Telehealth: Payer: Self-pay | Admitting: Nurse Practitioner

## 2021-06-25 NOTE — Telephone Encounter (Signed)
Called pt advised message have been sent to provider  ?

## 2021-06-25 NOTE — Telephone Encounter (Signed)
Please advise 

## 2021-06-25 NOTE — Telephone Encounter (Signed)
Patient called in regard to recent mychart message as well as previous tele message  ? ?Patient wants a call back as well as sister Delman Cheadle ?

## 2021-06-26 ENCOUNTER — Telehealth: Payer: Self-pay

## 2021-06-26 NOTE — Telephone Encounter (Signed)
Please advise 

## 2021-06-26 NOTE — Telephone Encounter (Signed)
Called pt and let her know that we have received the paperwork from [facility] and it has been forwarded to provider. Pt verbalized understanding ?

## 2021-06-26 NOTE — Telephone Encounter (Signed)
Patient called said needs work note to return on 03.27.2023 with no restrictions. Please call when ready for pick up. ?

## 2021-06-27 ENCOUNTER — Encounter: Payer: Self-pay | Admitting: Nurse Practitioner

## 2021-06-27 ENCOUNTER — Other Ambulatory Visit: Payer: Self-pay | Admitting: Licensed Clinical Social Worker

## 2021-06-27 DIAGNOSIS — F321 Major depressive disorder, single episode, moderate: Secondary | ICD-10-CM

## 2021-06-27 NOTE — Telephone Encounter (Signed)
Called pt advised paperwork is ready placed up front for pick up ?

## 2021-06-27 NOTE — Patient Instructions (Signed)
Visit Information ? ?Ms. Ikner was given information about Medicaid Managed Care team care coordination services and verbally consented to engagement with the [facility] Managed Care team.  ? ? ? Priority: High ? ?Timeframe:  Long-Range Goal ?Priority:  High ?Start Date:   06/27/21                ?Expected End Date:  ongoing                   ?  ?Follow Up Date--07/12/21 ? ?- check out counseling ?- keep 90 percent of counseling appointments ?- schedule counseling appointment  ?  ?Why is this important?   ?          Beating depression may take some time.  ?          If you don't feel better right away, don't give up on your treatment plan.  ?  ?Eula Fried, BSW, MSW, LCSW ?Managed Medicaid LCSW ?[facility] ?[email or web address] ?Phone: [phone] ? ? ?

## 2021-06-27 NOTE — Addendum Note (Signed)
Addended by: Greg Cutter on: 06/27/2021 10:07 AM ? ? Modules accepted: Orders ? ?

## 2021-06-27 NOTE — Patient Outreach (Signed)
?Medicaid Managed Care ?Social Work Note ? ?06/27/2021 ?Name:  GLADINE PLUDE MRN:  [id number] DOB:  11/25/1960 ? ?MARALEE HIGUCHI is an 61 y.o. year old female who is a primary patient of Renee Rival, FNP.  The [facility] Managed Care Coordination team was consulted for assistance with:  Mental Health Counseling and Resources ?Financial Difficulties related to medical bills ? ?Ms. Pinch was given information about Medicaid Managed Care Coordination team services today. Aris Everts Bisping Patient agreed to services and verbal consent obtained. ? ?Engaged with patient  for by telephone forinitial visit in response to referral for case management and/or care coordination services.  ? ?Assessments/Interventions:  Review of past medical history, allergies, medications, health status, including review of consultants reports, laboratory and other test data, was performed as part of comprehensive evaluation and provision of chronic care management services. ? ?SDOH: (Social Determinant of Health) assessments and interventions performed: ?SDOH Interventions   ? ?Flowsheet Row Most Recent Value  ?SDOH Interventions   ?Depression Interventions/Treatment  Counseling  ? ?  ? ? ?Advanced Directives Status:  See Care Plan for related entries. ? ?Care Plan ?                ?Allergies  ?Allergen Reactions  ? Chlorthalidone   ?  ELEVATED KIDNEY FUNCTION   ? ? ?Medications Reviewed Today   ? ? Reviewed by Renee Rival, FNP (Nurse Practitioner) on 06/21/21 at 2130  Med List Status: <None>  ? ?Medication Order Taking? Sig Documenting Provider Last Dose Status Informant  ?acetaminophen (TYLENOL) 325 MG tablet [id number] Yes Take 1-2 tablets (325-650 mg total) by mouth every 4 (four) hours as needed for mild pain. Cathlyn Parsons, PA-C Taking Active Family Member, Pharmacy Records  ?amiodarone (PACERONE) 200 MG tablet [id number] Yes Take 1 tablet (200 mg total) by mouth daily. Skeet Latch, MD Taking Active Family Member,  Pharmacy Records  ?amLODipine (NORVASC) 5 MG tablet [id number] Yes Take 1 tablet (5 mg total) by mouth daily. Barb Merino, MD Taking Active   ?aspirin 81 MG chewable tablet [id number] Yes Chew 4 tablets (324 mg total) by mouth daily. Cathlyn Parsons, PA-C Taking Active Family Member, Pharmacy Records  ?atorvastatin (LIPITOR) 80 MG tablet [id number] Yes Take 1 tablet (80 mg total) by mouth daily. Skeet Latch, MD Taking Active Family Member, Pharmacy Records  ?Blood Pressure KIT [id number] Yes MONITOR YOUR BLOOD PRESSURE TWICE A DAY  ?DX I10 Skeet Latch, MD Taking Active Family Member, Pharmacy Records  ?ferrous sulfate 325 (65 FE) MG tablet [id number] Yes Take 1 tablet (325 mg total) by mouth daily with breakfast. Angiulli, Lavon Paganini, PA-C Taking Active Family Member, Pharmacy Records  ?methocarbamol (ROBAXIN) 500 MG tablet [id number] Yes Take 1 tablet (500 mg total) by mouth every 6 (six) hours as needed for muscle spasms. Cathlyn Parsons, PA-C Taking Active Family Member, Pharmacy Records  ?metoprolol tartrate (LOPRESSOR) 25 MG tablet [id number] Yes Take 1 tablet (25 mg total) by mouth 2 (two) times daily. Skeet Latch, MD Taking Active Family Member, Pharmacy Records  ?Multiple Vitamin (MULTIVITAMIN WITH MINERALS) TABS tablet [id number] Yes Take 1 tablet by mouth daily. Florencia Reasons, MD Taking Active Family Member, Pharmacy Records  ?oxyCODONE (OXY IR/ROXICODONE) 5 MG immediate release tablet [id number] Yes Take 1-2 tablets (5-10 mg total) by mouth every 4 (four) hours as needed for moderate pain. Cathlyn Parsons, PA-C Taking Active Family Member, Pharmacy Records  ?pantoprazole (PROTONIX) 40 MG tablet [id number]  Take 1 tablet (40 mg  total) by mouth daily. Renee Rival, FNP  Active   ? ?  ?  ? ?  ? ? ?Patient Active Problem List  ? Diagnosis Date Noted  ? Depression, major, single episode, moderate ([place]) 06/21/2021  ? S/P exploratory laparotomy 06/21/2021  ? Mass of right breast 06/21/2021  ?  Disability examination 06/21/2021  ? Pressure injury of skin 06/02/2021  ? Hyperkalemia 06/01/2021  ? Acute hyperkalemia 06/01/2021  ? PAF (paroxysmal atrial fibrillation) ([place]) 05/07/2021  ? Essential hypertension 05/07/2021  ? Pure hypercholesterolemia 05/07/2021  ? Debility 04/15/2021  ? Acute hypoxemic respiratory failure ([place]) 04/07/2021  ? Malnutrition of moderate degree 04/03/2021  ? Encounter for postanesthesia care 04/02/2021  ? Peritonitis ([place]) 04/02/2021  ? Endotracheally intubated 04/02/2021  ? AKI (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 04/02/2021  ? S/P ascending aortic replacement 03/25/2021  ? Encounter for weaning from ventilator [facility]   ? Status post surgery 03/24/2021  ? Tobacco abuse 07/30/2017  ? History of drug use 07/30/2017  ? Influenza, pneumonia   ? 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  ? 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  ? Chest pain 05/31/2017  ? Coronary artery disease due to lipid rich plaque   ? Acute on chronic diastolic CHF (congestive heart failure) ([place])   ? Acute coronary syndrome ([place]) 12/23/2015  ? NSTEMI (non-ST elevated myocardial infarction) ([place]) 12/22/2015  ? ? ?Conditions to be addressed/monitored per PCP order:  Depression ? ?Priority: High ? ?Timeframe:  Long-Range Goal ?Priority:  High ?Start Date:   06/27/21                ?Expected End Date:  ongoing                   ?  ?Follow Up Date--07/12/21 ? ?- check out counseling ?- keep 90 percent of counseling appointments ?- schedule counseling appointment  ?  ?Why is this important?   ?          Beating depression may take some time.  ?          If you don't feel better right away, don't give up on your treatment plan.  ?  ?Current barriers:   ?          Chronic Mental Health needs related to depression  ?          Mental Health Concerns  ?          Needs Support, Education, and Care Coordination in order to meet unmet mental health needs. ?Clinical Goal(s): demonstrate a reduction in symptoms related to  :  connect with provider for ongoing mental health treatment.  , and increase coping skills, healthy habits, self-management skills, and stress reduction     ?Clinical Interventions:  ?          Assessed patient's previous and current treatment, coping skills, support system and barriers to care  ??         Depression screen reviewed  ??         Solution-Focused Strategies ??         Mindfulness or Relaxation Training ??         Active listening / Reflection utilized  ??         Emotional Supportive Provided ??         Behavioral Activation ??         Participation in counseling encouraged  ??  Verbalization of feelings encouraged  ??         Crisis Resource Education / information provided  ??         Suicidal Ideation/Homicidal Ideation assessed: No SI/HI ??         Discussed [facility]  ??         Discussed referral for counseling and psychiatry. Patient only prefers counseling at this time. Patient is agreeable to a referral to our [place] for assistance with bills (medical and utility.) Patient had a recent open heart surgery.  ?          Reviewed various resources, discussed options on available treatment options. Patient had 2 recent surgeries and was out of work. She will return to work next week. Patient's support system include her two sisters. One lives in [place] and the other in [place]. The sister that lives in [place] comes every weekend to visit patient and assist with her needs. ??         Options for mental health treatment based on need and insurance ?          Inter-disciplinary care team collaboration (see longitudinal plan of care) ?          LCSW discussed coping skills for anxiety and depression. SW used empathetic and active and reflective listening, validated feelings/concerns, and provided emotional support. LCSW provided self-care education to help manage her mental health conditions and improve her mood.  ?          Patient denies any current crises  or urgent needs. Patient is agreeable to referral to counseling. [facility] LCSW completed referral to [place] on 06/27/21. ? ?Patient Goals/Self-Care Activities: Over the next 120 days ?          Ca

## 2021-07-12 ENCOUNTER — Other Ambulatory Visit: Payer: Self-pay

## 2021-07-12 NOTE — Patient Outreach (Signed)
[facility]) Care Management ? ?07/12/2021 ? ?Dorothy Harris ?09/26/1960 ?[id number] ? ?LCSW completed [facility] outreach attempt today but was unable to reach patient successfully. A HIPPA compliant voice message was left encouraging patient to return call once available. LCSW will ask Scheduling Care Guide to reschedule [facility] SW appointment with patient as well. ? ?Eula Fried, BSW, MSW, LCSW ?Managed Medicaid LCSW ?[facility] ?[email or web address] ?Phone: [phone] ? ? ? ?

## 2021-07-12 NOTE — Patient Instructions (Signed)
[name] ,  ? ?The [facility] Managed Care Team is available to provide assistance to you with your healthcare needs at no cost and as a benefit of your [facility] Health plan. I'm sorry I was unable to reach you today for our scheduled appointment. Our care guide will call you to reschedule our telephone appointment. Please call me at the number below. I am available to be of assistance to you regarding your healthcare needs. .  ? ?Thank you,  ? ?Eula Fried, BSW, MSW, LCSW ?Managed Medicaid LCSW ?[facility] ?[email or web address] ?Phone: [phone] ? ? ?

## 2021-07-16 ENCOUNTER — Telehealth: Payer: Self-pay | Admitting: Nurse Practitioner

## 2021-07-16 NOTE — Telephone Encounter (Signed)
.. ?  Medicaid Managed Care  ? ?Unsuccessful Outreach Note ? ?07/16/2021 ?Name: Dorothy Harris MRN: [id number] DOB: 01-26-1961 ? ?Referred by: Renee Rival, FNP ?Reason for referral : High Risk Managed Medicaid (I called the patient today to get her rescheduled with the MM LCSW. I left my name and number on her VM.) ? ? ?An unsuccessful telephone outreach was attempted today. The patient was referred to the case management team for assistance with care management and care coordination.  ? ?Follow Up Plan: The care management team will reach out to the patient again over the next 7 days.  ? ? ?Reita Chard ?Care Guide, High Risk Medicaid Managed Care ?Embedded Care Coordination ?[place]  ? ? ?SIGNATURE  ?

## 2021-07-19 ENCOUNTER — Telehealth: Payer: Self-pay | Admitting: Nurse Practitioner

## 2021-07-19 NOTE — Telephone Encounter (Signed)
.. ?  Medicaid Managed Care  ? ?Unsuccessful Outreach Note ? ?07/19/2021 ?Name: Dorothy Harris MRN: [id number] DOB: 1960-08-21 ? ?Referred by: Renee Rival, FNP ?Reason for referral : High Risk Managed Medicaid (I called the patient today to get her rescheduled with the MM LCSW. I left my name and number on her VM.) ? ? ?A second unsuccessful telephone outreach was attempted today. The patient was referred to the case management team for assistance with care management and care coordination.  ? ?Follow Up Plan: The care management team will reach out to the patient again over the next 7 days.  ? ? ?Reita Chard ?Care Guide, High Risk Medicaid Managed Care ?Embedded Care Coordination ?[place]  ? ? ?SIGNATURE  ?

## 2021-07-30 ENCOUNTER — Ambulatory Visit (HOSPITAL_COMMUNITY)
Admission: RE | Admit: 2021-07-30 | Discharge: 2021-07-30 | Disposition: A | Discharge status: Home / Self Care | Payer: PRIVATE HEALTH INSURANCE | Source: Ambulatory Visit | Attending: Nurse Practitioner | Admitting: Nurse Practitioner

## 2021-07-30 DIAGNOSIS — Z932 Ileostomy status: Secondary | ICD-10-CM | POA: Diagnosis not present

## 2021-07-30 DIAGNOSIS — Z433 Encounter for attention to colostomy: Secondary | ICD-10-CM | POA: Diagnosis not present

## 2021-07-30 DIAGNOSIS — L24B1 Irritant contact dermatitis related to digestive stoma or fistula: Secondary | ICD-10-CM | POA: Diagnosis not present

## 2021-07-30 NOTE — Progress Notes (Signed)
[facility]  ? ?Reason for visit:  ?RMQ ileostomy  No longer wishes to wear 1 piece pouch.  Wants 2 piece convex pouch with transparent pouch.   ?Is back at work and frustrated with frequent leaks.  Felt she leaked less in 2 piece.  Will wear a belt to improve seal.   ?HPI:  ?Colon resection, lysis of adhesions, creation of ileostomy ?ROS  ?Review of Systems  ?Gastrointestinal:   ?     Oval stoma, needs convex pouch  prefers 2 piece  ?All other systems reviewed and are negative. ?Vital signs:  ?BP (!) 174/103 (BP Location: Right Arm)   Pulse 73   Temp 98.1 ?F (36.7 ?C) (Oral)   Resp 18   SpO2 100%  ?Exam:  ?Physical Exam ?Constitutional:   ?   Appearance: Normal appearance.  ?Abdominal:  ?   Palpations: Abdomen is soft.  ?Skin: ?   General: Skin is warm and dry.  ?   Findings: Rash present.  ?   Comments: Peristomal skin breakdown ?Midline abdominal surgical wound, open to air.  Nonhealing.   ?Psychiatric:     ?   Mood and Affect: Mood normal.  ?  ?Stoma type/location:  RMQ ileostomy ?Stomal assessment/size:  1", slightly oval ?Peristomal assessment:  midline surgical wound, nearly healed.  Open to air.  ?Treatment options for stomal/peristomal skin: 2 piece convex pouch with barrier ring and ostomy belt.  ?Output: liquid brown stool ?Ostomy pouching:2pc.  Convex pouch with barrier ring and belt.  ?Education provided:  We perform pouch change. I apply barrier strips and ostomy belt for added security.  Is back at work and having some leaks.  ? ?  ?Impression/dx  ?Ileostomy ?Nonhealing surgical wound ?Discussion  ?2 piece convex pouch ?Will set up with [facility] for supplies ?Plan  ?See back as needed.  ?Needs to follow up with [facility] for reversal options.  ? ? ? ?Visit time: 45 minutes.  ? ?Domenic Moras FNP-BC ? ?  ?

## 2021-07-30 NOTE — Discharge Instructions (Signed)
Follow up with [facility] for reversal options:  ?[phone] ? ?Patient was seen today in my office.  Please excuse her absence. She can return to work July 30, 2021 ? ?Switching to [place] clear 2 piece pouch (convex) ?Stoma is now oval shaped , use pattern provided. ?USe barrier strips to secure pouch.  ?

## 2021-08-09 ENCOUNTER — Ambulatory Visit (HOSPITAL_COMMUNITY)
Admission: RE | Admit: 2021-08-09 | Discharge: 2021-08-09 | Disposition: A | Discharge status: Home / Self Care | Payer: PRIVATE HEALTH INSURANCE | Source: Ambulatory Visit | Attending: Nurse Practitioner | Admitting: Nurse Practitioner

## 2021-08-09 DIAGNOSIS — L538 Other specified erythematous conditions: Secondary | ICD-10-CM | POA: Insufficient documentation

## 2021-08-09 DIAGNOSIS — K9409 Other complications of colostomy: Secondary | ICD-10-CM | POA: Diagnosis not present

## 2021-08-09 NOTE — Progress Notes (Signed)
[facility]  ? ?Reason for visit:  ?RMQ ileostomy,  some blood in pouch.  Stoma is changing in size at times ?HPI:  ?Colon resection, ileostomy ?ROS  ?Review of Systems  ?Gastrointestinal:   ?     Bleeding stoma, prolapsed, telescoping with peristalsis  ?Skin:  Positive for color change.  ?     Erythema around stoma  ?All other systems reviewed and are negative. ?Vital signs:  ?BP (!) 162/94   Pulse 70   Temp 98.5 ?F (36.9 ?C)   Resp 18   SpO2 100%  ?Exam:  ?Physical Exam ?Vitals reviewed.  ?Constitutional:   ?   Appearance: Normal appearance.  ?Abdominal:  ?   Palpations: Abdomen is soft.  ?   Comments: Prolapsed stoma  ?Skin: ?   General: Skin is warm and dry.  ?   Findings: Erythema present.  ?   Comments: Peristomal redness  ?Neurological:  ?   Mental Status: She is alert.  ?Psychiatric:     ?   Mood and Affect: Mood normal.  ?  ?Stoma type/location:  RMQ prolapsed ?Stomal assessment/size:  1" today, but changes in size with peristalsis ?Peristomal assessment:  stoma bleeding, likely trauma from telescoping stoma. Will cut opening larger and demonstrate for patient.  ?Treatment options for stomal/peristomal skin: Barrier ring, stretched larger, opening cut to 1 1/2"  ?Output: soft brown stool, there is evidence of trauma to stoma.  ?Ostomy pouching: 2pc. 2 3/4" pouch  not wearing belt today.   ?Education provided:  Cut opening larger ? ?  ?Impression/dx  ?Prolapsed stoma ?Discussion  ?Encouraged to wear belt to support weight of stoma.  Provided another belt ?Plan  ?Update with [facility].  ? ? ? ?Visit time: 45 minutes.  ? ?Domenic Moras FNP-BC ? ?  ?

## 2021-08-09 NOTE — Discharge Instructions (Addendum)
Back to 2 piece pouch ?Cut 1 3/4"  ?Prolapsed stoma ? ?May return to work 08/09/21  No restrictions ? ?

## 2021-08-13 LAB — COMPREHENSIVE METABOLIC PANEL
Albumin: 4.8 (ref 3.5–5.0)
Calcium: 9.3 (ref 8.7–10.7)
eGFR: 23

## 2021-08-13 LAB — BASIC METABOLIC PANEL
BUN: 22 — AB (ref 4–21)
CO2: 14 (ref 13–22)
Chloride: 111 — AB (ref 99–108)
Creatinine: 2.4 — AB (ref <=1.1)
Glucose: 104
Potassium: 5 mEq/L (ref 3.5–5.1)
Sodium: 143 (ref 137–147)

## 2021-08-20 ENCOUNTER — Ambulatory Visit (HOSPITAL_COMMUNITY)
Admission: RE | Admit: 2021-08-20 | Discharge: 2021-08-20 | Disposition: A | Discharge status: Home / Self Care | Payer: PRIVATE HEALTH INSURANCE | Source: Ambulatory Visit | Attending: Nurse Practitioner | Admitting: Nurse Practitioner

## 2021-08-20 ENCOUNTER — Other Ambulatory Visit (HOSPITAL_COMMUNITY): Payer: Self-pay | Admitting: Nurse Practitioner

## 2021-08-20 DIAGNOSIS — Z932 Ileostomy status: Secondary | ICD-10-CM | POA: Diagnosis present

## 2021-08-20 DIAGNOSIS — L24B3 Irritant contact dermatitis related to fecal or urinary stoma or fistula: Secondary | ICD-10-CM

## 2021-08-20 DIAGNOSIS — Z432 Encounter for attention to ileostomy: Secondary | ICD-10-CM

## 2021-08-20 DIAGNOSIS — B372 Candidiasis of skin and nail: Secondary | ICD-10-CM | POA: Diagnosis not present

## 2021-08-20 DIAGNOSIS — L22 Diaper dermatitis: Secondary | ICD-10-CM | POA: Diagnosis not present

## 2021-08-20 NOTE — Discharge Instructions (Addendum)
Follow up as needed [facility] [phone] Added fungal powder instead of stoma powder due to itching and irritation

## 2021-08-20 NOTE — Progress Notes (Signed)
[facility]   Reason for visit:  RLQ ileostomy, flush with contact dermatitis HPI:  Exploratory laparotomy with lysis of adhesions, ventral hernia repair and creation of ileostomy ROS  Review of Systems  Gastrointestinal:        Peristomal irritation  All other systems reviewed and are negative. Vital signs:  BP (!) 169/92 (BP Location: Left Arm)   Pulse 67   Temp 97.6 F (36.4 C) (Oral)   Resp 17   SpO2 100%  Exam:  Physical Exam Skin:    General: Skin is warm and dry.     Comments: Peristomal skin is itching and patient sweats at work. Will implement antifungal powder at this time to combat yeast.   Neurological:     Mental Status: She is alert.  Psychiatric:        Mood and Affect: Mood normal.    Stoma type/location:  RMQ ileostomy Stomal assessment/size:  1 1/4" pink and moist Peristomal assessment:  intact Treatment options for stomal/peristomal skin: barrier ring 2 piece filtered pouch ostomy belt Output: soft brown stool Ostomy pouching: 2pc. 2 3/4" pouch  Education provided:  [facility] was originally used and now switching to [facility] for supplies.     Impression/dx  ileostomy Discussion  Follow up with clinic as needed.  Plan  Call as needed.  Ostomy belt to hold pouch more securely     Visit time: 45 minutes.   Domenic Moras FNP-BC

## 2021-09-06 ENCOUNTER — Encounter: Payer: PRIVATE HEALTH INSURANCE | Admitting: Nurse Practitioner

## 2021-09-10 ENCOUNTER — Other Ambulatory Visit: Payer: Self-pay | Admitting: Surgery

## 2021-09-10 DIAGNOSIS — Z09 Encounter for follow-up examination after completed treatment for conditions other than malignant neoplasm: Secondary | ICD-10-CM

## 2021-09-10 DIAGNOSIS — Z932 Ileostomy status: Secondary | ICD-10-CM

## 2021-09-20 ENCOUNTER — Encounter: Payer: Self-pay | Admitting: Nurse Practitioner

## 2021-09-20 ENCOUNTER — Ambulatory Visit
Admission: RE | Admit: 2021-09-20 | Discharge: 2021-09-20 | Disposition: A | Discharge status: Home / Self Care | Payer: PRIVATE HEALTH INSURANCE | Source: Ambulatory Visit | Attending: Surgery | Admitting: Surgery

## 2021-09-20 ENCOUNTER — Telehealth: Payer: Self-pay

## 2021-09-20 DIAGNOSIS — Z932 Ileostomy status: Secondary | ICD-10-CM

## 2021-09-20 DIAGNOSIS — Z09 Encounter for follow-up examination after completed treatment for conditions other than malignant neoplasm: Secondary | ICD-10-CM

## 2021-09-20 NOTE — Telephone Encounter (Signed)
Called pt to r/s missed appt per fola someone answered and said that the pt was busy and will call back

## 2021-10-11 ENCOUNTER — Encounter (HOSPITAL_COMMUNITY): Payer: Self-pay | Admitting: Nurse Practitioner

## 2021-10-18 ENCOUNTER — Encounter (HOSPITAL_COMMUNITY): Payer: Self-pay | Admitting: Nurse Practitioner

## 2021-10-28 ENCOUNTER — Encounter: Payer: Self-pay | Admitting: Nurse Practitioner

## 2021-10-29 ENCOUNTER — Encounter (HOSPITAL_COMMUNITY): Payer: Self-pay | Admitting: Nurse Practitioner

## 2021-11-05 ENCOUNTER — Ambulatory Visit (INDEPENDENT_AMBULATORY_CARE_PROVIDER_SITE_OTHER): Payer: PRIVATE HEALTH INSURANCE | Admitting: Nurse Practitioner

## 2021-11-05 ENCOUNTER — Telehealth: Payer: Self-pay | Admitting: Nurse Practitioner

## 2021-11-05 ENCOUNTER — Encounter: Payer: Self-pay | Admitting: Nurse Practitioner

## 2021-11-05 ENCOUNTER — Other Ambulatory Visit: Payer: Self-pay

## 2021-11-05 VITALS — BP 108/71 | HR 64 | Ht 67.0 in | Wt 184.0 lb

## 2021-11-05 DIAGNOSIS — Z720 Tobacco use: Secondary | ICD-10-CM | POA: Diagnosis not present

## 2021-11-05 DIAGNOSIS — I10 Essential (primary) hypertension: Secondary | ICD-10-CM

## 2021-11-05 DIAGNOSIS — Z0001 Encounter for general adult medical examination with abnormal findings: Secondary | ICD-10-CM

## 2021-11-05 DIAGNOSIS — Z0000 Encounter for general adult medical examination without abnormal findings: Secondary | ICD-10-CM

## 2021-11-05 DIAGNOSIS — F325 Major depressive disorder, single episode, in full remission: Secondary | ICD-10-CM

## 2021-11-05 DIAGNOSIS — Z1231 Encounter for screening mammogram for malignant neoplasm of breast: Secondary | ICD-10-CM

## 2021-11-05 NOTE — Progress Notes (Signed)
Complete physical exam  Patient: Dorothy Harris   DOB: 01-Apr-1961   61 y.o. Female  MRN: [id number]  Subjective:    Chief Complaint  Patient presents with   Annual Exam    cpe    Dorothy Harris is a 61 y.o. with past medical history of descending thoracic dissection, paroxysmal A-fib, essential hypertension, AKI on CKD stage III, tobacco abuse female who presents today for a complete physical exam. She reports consuming a low fat and low sodium diet. Does a lot walking exercises at work She generally feels well.   Has upcoming colonscopy and plans for colostomy reversal.  She has been following up with nephrology, has upcoming appointment on 8/18.   Not willing to do a PAP exam today  Smokes one cigarette daily, need to quit smoking including risk of stroke,, COPD , lung cancer  heart attack discussed.     Most recent fall risk assessment:    11/05/2021    2:40 PM  Fall Risk   Falls in the past year? 0  Number falls in past yr: 0  Injury with Fall? 0  Risk for fall due to : No Fall Risks  Follow up Falls evaluation completed     Most recent depression screenings:    11/05/2021    2:40 PM 06/27/2021    9:08 AM  PHQ 2/9 Scores  PHQ - 2 Score 0 5  PHQ- 9 Score  12        Patient Care Team: Donell Beers, FNP as PCP - General (Nurse Practitioner) Quintella Reichert, MD as PCP - Cardiology (Cardiology)   Outpatient Medications Prior to Visit  Medication Sig   acetaminophen (TYLENOL) 325 MG tablet Take 1-2 tablets (325-650 mg total) by mouth every 4 (four) hours as needed for mild pain.   amiodarone (PACERONE) 200 MG tablet Take 1 tablet (200 mg total) by mouth daily.   amLODipine (NORVASC) 10 MG tablet Take 10 mg by mouth daily.   aspirin 81 MG chewable tablet Chew 4 tablets (324 mg total) by mouth daily.   atorvastatin (LIPITOR) 80 MG tablet Take 1 tablet (80 mg total) by mouth daily.   Blood Pressure KIT MONITOR YOUR BLOOD PRESSURE TWICE A DAY  DX I10    ferrous sulfate 325 (65 FE) MG tablet Take 1 tablet (325 mg total) by mouth daily with breakfast.   metoprolol tartrate (LOPRESSOR) 25 MG tablet Take 1 tablet (25 mg total) by mouth 2 (two) times daily.   Multiple Vitamin (MULTIVITAMIN WITH MINERALS) TABS tablet Take 1 tablet by mouth daily.   pantoprazole (PROTONIX) 40 MG tablet Take 1 tablet (40 mg total) by mouth daily.   sodium bicarbonate 650 MG tablet Take 1,300 mg by mouth 2 (two) times daily.   amLODipine (NORVASC) 5 MG tablet Take 1 tablet (5 mg total) by mouth daily.   methocarbamol (ROBAXIN) 500 MG tablet Take 1 tablet (500 mg total) by mouth every 6 (six) hours as needed for muscle spasms. (Patient not taking: Reported on 11/05/2021)   oxyCODONE (OXY IR/ROXICODONE) 5 MG immediate release tablet Take 1-2 tablets (5-10 mg total) by mouth every 4 (four) hours as needed for moderate pain. (Patient not taking: Reported on 11/05/2021)   No facility-administered medications prior to visit.    Review of Systems  Constitutional: Negative.  Negative for chills, fever and weight loss.  HENT: Negative.  Negative for ear pain, hearing loss and tinnitus.   Eyes: Negative.  Negative for  blurred vision, double vision, photophobia, discharge and redness.  Respiratory: Negative.  Negative for cough, hemoptysis and sputum production.   Cardiovascular: Negative.  Negative for chest pain, palpitations, orthopnea and claudication.  Gastrointestinal: Negative.  Negative for abdominal pain, heartburn, nausea and vomiting.  Genitourinary: Negative.  Negative for dysuria, frequency and urgency.  Musculoskeletal: Negative.  Negative for back pain, myalgias and neck pain.  Skin: Negative.  Negative for itching and rash.  Neurological: Negative.  Negative for dizziness, tingling and headaches.  Endo/Heme/Allergies: Negative.  Negative for environmental allergies and polydipsia. Does not bruise/bleed easily.  Psychiatric/Behavioral: Negative.  Negative for  depression, substance abuse and suicidal ideas.           Objective:     BP 108/71 (BP Location: Right Arm, Patient Position: Sitting, Cuff Size: Normal)   Pulse 64   Ht $R'5\' 7"'XK$  (1.702 m)   Wt 184 lb (83.5 kg)   SpO2 98%   BMI 28.82 kg/m    Physical Exam Exam conducted with a chaperone present.  Constitutional:      General: She is not in acute distress.    Appearance: She is not ill-appearing, toxic-appearing or diaphoretic.  HENT:     Head: Normocephalic and atraumatic.     Right Ear: Tympanic membrane, ear canal and external ear normal. There is no impacted cerumen.     Left Ear: Tympanic membrane, ear canal and external ear normal. There is no impacted cerumen.     Nose: Nose normal. No congestion or rhinorrhea.     Mouth/Throat:     Mouth: Mucous membranes are moist.     Pharynx: Oropharynx is clear. No oropharyngeal exudate or posterior oropharyngeal erythema.  Eyes:     General:        Right eye: No discharge.        Left eye: No discharge.     Extraocular Movements: Extraocular movements intact.     Conjunctiva/sclera: Conjunctivae normal.     Pupils: Pupils are equal, round, and reactive to light.  Neck:     Vascular: No carotid bruit.  Cardiovascular:     Rate and Rhythm: Normal rate and regular rhythm.     Heart sounds: Murmur heard.     No friction rub. No gallop.  Pulmonary:     Effort: Pulmonary effort is normal. No respiratory distress.     Breath sounds: Normal breath sounds. No stridor. No wheezing, rhonchi or rales.  Chest:     Chest wall: No mass, lacerations, deformity, swelling, tenderness, crepitus or edema.  Breasts:    Tanner Score is 5.     Right: Normal. No swelling, bleeding, inverted nipple, mass, nipple discharge, skin change or tenderness.     Left: Normal. No swelling, bleeding, inverted nipple, mass, nipple discharge, skin change or tenderness.     Comments: From previous surgery noted on the chest Abdominal:     General: There is  no distension.     Palpations: Abdomen is soft. There is no mass.     Tenderness: There is no abdominal tenderness. There is no right CVA tenderness, left CVA tenderness, guarding or rebound.     Hernia: No hernia is present.     Comments: Ileostomy in place stoma moist and pink  Scar from previous surgery noted on the abdomen  Musculoskeletal:        General: No swelling, tenderness, deformity or signs of injury. Normal range of motion.     Cervical back: Normal range of motion  and neck supple. No rigidity or tenderness.     Right lower leg: No edema.     Left lower leg: No edema.  Lymphadenopathy:     Cervical: No cervical adenopathy.     Upper Body:     Right upper body: No supraclavicular, axillary or pectoral adenopathy.     Left upper body: No supraclavicular, axillary or pectoral adenopathy.  Skin:    General: Skin is warm and dry.     Capillary Refill: Capillary refill takes less than 2 seconds.     Coloration: Skin is not jaundiced or pale.     Findings: No bruising, erythema, lesion or rash.  Neurological:     Mental Status: She is alert and oriented to person, place, and time.     Cranial Nerves: No cranial nerve deficit.     Sensory: No sensory deficit.     Motor: No weakness.     Coordination: Coordination normal.     Gait: Gait normal.     Deep Tendon Reflexes: Reflexes normal.  Psychiatric:        Mood and Affect: Mood normal.        Behavior: Behavior normal.        Thought Content: Thought content normal.        Judgment: Judgment normal.      No results found for any visits on 11/05/21.     Assessment & Plan:    Routine Health Maintenance and Physical Exam  Immunization History  Administered Date(s) Administered   Pneumococcal Polysaccharide-23 06/02/2017   Tdap 07/30/2017    Health Maintenance  Topic Date Due   PAP SMEAR-Modifier  Never done   COLONOSCOPY (Pts 45-35yrs Insurance coverage will need to be confirmed)  Never done   Zoster Vaccines-  Shingrix (1 of 2) Never done   MAMMOGRAM  08/08/2019   INFLUENZA VACCINE  11/05/2021   COVID-19 Vaccine (1) 12/05/2021 (Originally 08/13/1961)   TETANUS/TDAP  07/31/2027   Hepatitis C Screening  Completed   HIV Screening  Completed   HPV VACCINES  Aged Out    Discussed health benefits of physical activity, and encouraged her to engage in regular exercise appropriate for her age and condition.  Problem List Items Addressed This Visit       Cardiovascular and Mediastinum   Essential hypertension    BP Readings from Last 3 Encounters:  11/05/21 108/71  08/20/21 (!) 169/92  08/09/21 (!) 162/94  Chronic condition well-controlled on amlodipine 10 mg daily, metoprolol 25 mg twice daily Continue current medication DASH diet advised engage in regular moderate exercise at least 150 minutes weekly. Follow-up in 6 months      Relevant Medications   amLODipine (NORVASC) 10 MG tablet     Other   Tobacco abuse    Smokes about 1 cigarette /day  Asked about quitting: confirms that he/she currently smokes cigarettes Advise to quit smoking: Educated about QUITTING to reduce the risk of cancer, cardio and cerebrovascular disease. Assess willingness: willing to quit at this time,and is working on cutting back. Assist with counseling and pharmacotherapy: Counseled for 5 minutes and literature provided. Arrange for follow up: follow up in 3-62months and continue to offer help.      Depression, major, single episode, complete remission ([place])    PHQ-9 score 0 Not on medications       Annual physical exam - Primary    Annual exam as documented.  Counseling done include healthy lifestyle involving committing to 150 minutes of  exercise per week, heart healthy diet, and attaining healthy weight. The importance of adequate sleep also discussed.  Regular use of seat belt and home safety were also discussed . Changes in health habits are decided on by patient with goals and time frames set for  achieving them. Immunization and cancer screening  needs are specifically addressed at this visit.  Has upcoming colonoscopy.  Due for shingles vaccine need for shingles vaccine discussed with patient patient encouraged to get the vaccine at her pharmacy.      Relevant Orders   Lipid Profile   HgB A1c   CBC with Differential   Vitamin D (25 hydroxy)   Return in about 6 months (around 05/08/2022) for HTN/HLD.     Renee Rival, FNP

## 2021-11-05 NOTE — Assessment & Plan Note (Signed)
Smokes about 1 cigarette /day  Asked about quitting: confirms that he/she currently smokes cigarettes Advise to quit smoking: Educated about QUITTING to reduce the risk of cancer, cardio and cerebrovascular disease. Assess willingness: willing to quit at this time,and is working on cutting back. Assist with counseling and pharmacotherapy: Counseled for 5 minutes and literature provided. Arrange for follow up: follow up in 3-95months and continue to offer help.

## 2021-11-05 NOTE — Patient Instructions (Addendum)
Please get your fasting labs done as discussed. Have your kidney doctor send [place] your labs when you get it done   Please get your shingles vaccine at your pharmacy    Please schedule your mammogram today   It is important that you exercise regularly at least 30 minutes 5 times a week.  Think about what you will eat, plan ahead. Choose " clean, green, fresh or frozen" over canned, processed or packaged foods which are more sugary, salty and fatty. 70 to 75% of food eaten should be vegetables and fruit. Three meals at set times with snacks allowed between meals, but they must be fruit or vegetables. Aim to eat over a 12 hour period , example 7 am to 7 pm, and STOP after  your last meal of the day. Drink water,generally about 64 ounces per day, no other drink is as healthy. Fruit juice is best enjoyed in a healthy way, by EATING the fruit.  Thanks for choosing [facility], we consider it a privelige to serve you.

## 2021-11-05 NOTE — Telephone Encounter (Signed)
Per Manuela Schwartz in Radiology, pt has not had mammogram since 08/2017. She is needing the dx mammogram order for left & right and she is need the [place] for both.

## 2021-11-05 NOTE — Assessment & Plan Note (Signed)
Annual exam as documented.  Counseling done include healthy lifestyle involving committing to 150 minutes of exercise per week, heart healthy diet, and attaining healthy weight. The importance of adequate sleep also discussed.  Regular use of seat belt and home safety were also discussed . Changes in health habits are decided on by patient with goals and time frames set for achieving them. Immunization and cancer screening  needs are specifically addressed at this visit.  Has upcoming colonoscopy.  Due for shingles vaccine need for shingles vaccine discussed with patient patient encouraged to get the vaccine at her pharmacy.

## 2021-11-05 NOTE — Assessment & Plan Note (Signed)
BP Readings from Last 3 Encounters:  11/05/21 108/71  08/20/21 (!) 169/92  08/09/21 (!) 162/94  Chronic condition well-controlled on amlodipine 10 mg daily, metoprolol 25 mg twice daily Continue current medication DASH diet advised engage in regular moderate exercise at least 150 minutes weekly. Follow-up in 6 months

## 2021-11-05 NOTE — Telephone Encounter (Signed)
Done and scheduled pt aware

## 2021-11-05 NOTE — Assessment & Plan Note (Signed)
PHQ-9 score 0 Not on medications

## 2021-11-08 ENCOUNTER — Other Ambulatory Visit: Payer: Self-pay | Admitting: Surgery

## 2021-11-08 DIAGNOSIS — I7121 Aneurysm of the ascending aorta, without rupture: Secondary | ICD-10-CM
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
## 2021-11-25 ENCOUNTER — Ambulatory Visit: Payer: PRIVATE HEALTH INSURANCE | Admitting: Nurse Practitioner

## 2021-12-02 ENCOUNTER — Other Ambulatory Visit (HOSPITAL_COMMUNITY): Payer: Self-pay | Admitting: Nurse Practitioner

## 2021-12-02 DIAGNOSIS — R928 Other abnormal and inconclusive findings on diagnostic imaging of breast: Secondary | ICD-10-CM

## 2021-12-05 ENCOUNTER — Telehealth (HOSPITAL_BASED_OUTPATIENT_CLINIC_OR_DEPARTMENT_OTHER): Payer: Self-pay | Admitting: *Deleted

## 2021-12-05 NOTE — Telephone Encounter (Signed)
Called patient to get her follow up scheduled in [facility] HTN clinic Patient wanted to wait a few months before follow up secondary to multiple upcoming medical appointments Patient scheduled for November

## 2021-12-10 ENCOUNTER — Ambulatory Visit (HOSPITAL_COMMUNITY)
Admission: RE | Admit: 2021-12-10 | Discharge: 2021-12-10 | Disposition: A | Discharge status: Home / Self Care | Payer: PRIVATE HEALTH INSURANCE | Source: Ambulatory Visit | Attending: Nurse Practitioner | Admitting: Nurse Practitioner

## 2021-12-10 ENCOUNTER — Encounter (HOSPITAL_COMMUNITY): Payer: PRIVATE HEALTH INSURANCE

## 2021-12-10 DIAGNOSIS — Z1231 Encounter for screening mammogram for malignant neoplasm of breast: Secondary | ICD-10-CM | POA: Diagnosis present

## 2021-12-10 DIAGNOSIS — R928 Other abnormal and inconclusive findings on diagnostic imaging of breast: Secondary | ICD-10-CM

## 2021-12-23 LAB — CBC AND DIFFERENTIAL
HCT: 32 — AB (ref 36–46)
Hemoglobin: 10.7 — AB (ref 12.0–16.0)
Platelets: 202 10*3/uL (ref 150–400)
WBC: 9

## 2021-12-23 LAB — CBC: RBC: 3.28 — AB (ref 3.87–5.11)

## 2021-12-23 LAB — COMPREHENSIVE METABOLIC PANEL
Albumin: 4.7 (ref 3.5–5.0)
Calcium: 9.3 (ref 8.7–10.7)
eGFR: 26

## 2021-12-23 LAB — IRON,TIBC AND FERRITIN PANEL
%SAT: 35
Ferritin: 307
Iron: 105
TIBC: 302
UIBC: 197

## 2021-12-23 LAB — BASIC METABOLIC PANEL
BUN: 24 — AB (ref 4–21)
CO2: 17 (ref 13–22)
Chloride: 107 (ref 99–108)
Creatinine: 2.1 — AB (ref <=1.1)
Glucose: 93
Potassium: 4.6 mEq/L (ref 3.5–5.1)
Sodium: 139 (ref 137–147)

## 2021-12-25 ENCOUNTER — Inpatient Hospital Stay: Admission: RE | Admit: 2021-12-25 | Payer: PRIVATE HEALTH INSURANCE | Source: Ambulatory Visit

## 2021-12-25 ENCOUNTER — Ambulatory Visit
Admission: RE | Admit: 2021-12-25 | Discharge: 2021-12-25 | Disposition: A | Discharge status: Home / Self Care | Payer: Self-pay | Source: Ambulatory Visit | Attending: Surgery | Admitting: Surgery

## 2021-12-25 ENCOUNTER — Encounter: Payer: Self-pay | Admitting: *Deleted

## 2021-12-25 ENCOUNTER — Encounter: Payer: Self-pay | Admitting: Surgery

## 2021-12-25 ENCOUNTER — Ambulatory Visit (INDEPENDENT_AMBULATORY_CARE_PROVIDER_SITE_OTHER): Payer: PRIVATE HEALTH INSURANCE | Admitting: Surgery

## 2021-12-25 VITALS — BP 132/80 | HR 65 | Resp 18 | Ht 67.0 in | Wt 184.0 lb

## 2021-12-25 DIAGNOSIS — Z95828 Presence of other vascular implants and grafts: Secondary | ICD-10-CM | POA: Diagnosis not present

## 2021-12-25 DIAGNOSIS — I7121 Aneurysm of the ascending aorta, without rupture: Secondary | ICD-10-CM

## 2021-12-25 NOTE — Progress Notes (Signed)
     HPI: ***  Current Outpatient Medications  Medication Sig Dispense Refill   acetaminophen (TYLENOL) 325 MG tablet Take 1-2 tablets (325-650 mg total) by mouth every 4 (four) hours as needed for mild pain.     amiodarone (PACERONE) 200 MG tablet Take 1 tablet (200 mg total) by mouth daily. 90 tablet 2   amLODipine (NORVASC) 10 MG tablet Take 10 mg by mouth daily.     aspirin 81 MG chewable tablet Chew 4 tablets (324 mg total) by mouth daily.     atorvastatin (LIPITOR) 80 MG tablet Take 1 tablet (80 mg total) by mouth daily. 90 tablet 2   Blood Pressure KIT MONITOR YOUR BLOOD PRESSURE TWICE A DAY  DX I10 1 kit 0   ferrous sulfate 325 (65 FE) MG tablet Take 1 tablet (325 mg total) by mouth daily with breakfast. 30 tablet 0   metoprolol tartrate (LOPRESSOR) 25 MG tablet Take 1 tablet (25 mg total) by mouth 2 (two) times daily. 180 tablet 2   Multiple Vitamin (MULTIVITAMIN WITH MINERALS) TABS tablet Take 1 tablet by mouth daily.     pantoprazole (PROTONIX) 40 MG tablet Take 1 tablet (40 mg total) by mouth daily. 30 tablet 3   sodium bicarbonate 650 MG tablet Take 1,300 mg by mouth 2 (two) times daily.     No current facility-administered medications for this visit.     Physical Exam: ***  Diagnostic Tests: ***  Impression: ***  Plan: ***   Gaye Pollack, MD [facility] [phone]

## 2021-12-27 ENCOUNTER — Encounter: Payer: Self-pay | Admitting: Nurse Practitioner
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
## 2022-01-01 ENCOUNTER — Encounter: Payer: Self-pay | Admitting: Nurse Practitioner

## 2022-01-01 ENCOUNTER — Ambulatory Visit (INDEPENDENT_AMBULATORY_CARE_PROVIDER_SITE_OTHER): Payer: Self-pay | Admitting: Nurse Practitioner

## 2022-01-01 VITALS — BP 140/88 | HR 75 | Ht 67.0 in | Wt 185.6 lb

## 2022-01-01 DIAGNOSIS — Z933 Colostomy status: Secondary | ICD-10-CM

## 2022-01-01 DIAGNOSIS — Z1211 Encounter for screening for malignant neoplasm of colon: Secondary | ICD-10-CM

## 2022-01-01 NOTE — Patient Instructions (Signed)
If you are age 61 or younger, your body mass index should be between 19-25. Your Body mass index is 29.07 kg/m. If this is out of the aformentioned range listed, please consider follow up with your Primary Care Provider.  ________________________________________________________  The  GI providers would like to encourage you to use [facility] to communicate with providers for non-urgent requests or questions.  Due to long hold times on the telephone, sending your provider a message by [facility] may be a faster and more efficient way to get a response.  Please allow 48 business hours for a response.  Please remember that this is for non-urgent requests.  _______________________________________________________  Please contact our office to schedule a colonoscopy after vascular surgery work up has been completed.  Thank you for entrusting me with your care and choosing [facility].  Carl Best, NP

## 2022-01-03 NOTE — Progress Notes (Signed)
Reviewed and agree with management plans. ? ?Tullio Chausse L. Shanavia Makela, MD, MPH  ?

## 2022-01-06 ENCOUNTER — Encounter: Payer: Self-pay | Admitting: Surgery

## 2022-01-06 ENCOUNTER — Other Ambulatory Visit: Payer: Self-pay

## 2022-01-06 ENCOUNTER — Ambulatory Visit (INDEPENDENT_AMBULATORY_CARE_PROVIDER_SITE_OTHER): Payer: PRIVATE HEALTH INSURANCE | Admitting: Surgery

## 2022-01-06 VITALS — BP 145/86 | HR 63 | Temp 97.9°F | Resp 20 | Ht 67.0 in | Wt 184.0 lb

## 2022-01-06 DIAGNOSIS — I712 Thoracic aortic aneurysm, without rupture, unspecified: Secondary | ICD-10-CM

## 2022-01-06 DIAGNOSIS — I7143 Infrarenal abdominal aortic aneurysm, without rupture: Secondary | ICD-10-CM

## 2022-01-06 NOTE — H&P (View-Only) (Signed)
[facility]ist of [facility]  Patient name: Dorothy Harris MRN: [id number] DOB: 1960-10-01 Sex: female   REQUESTING PROVIDER:    Dr. Cyndia Bent   REASON FOR CONSULT:    TAAA  HISTORY OF PRESENT ILLNESS:   Dorothy Harris is a 61 y.o. female, who is referred for evaluation of thoracic and abdominal aortic aneurysm.  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  She underwent open repair including aortic arch reconstruction under circulatory arrest.  Her postoperative course was complicated by an incarcerated ventral hernia with colon necrosis and perforation which required exploratory laparotomy, colon resection, and ileostomy with hernia repair.  Her dissection repair was done using a 30 x 10 by 8 x 8 x 10 Hemashield graft  The patient has progressed to stage IIIb renal insufficiency with a creatinine around 2.4.  She is medically managed for hypertension.  She is a smoker.  She takes a statin for hypercholesterolemia.  She is medically managed for hypertension  PAST MEDICAL HISTORY    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[place])    Essential hypertension 05/07/2021   Hernia, epigastric    Hypertension    PAF (paroxysmal atrial fibrillation) ([place]) 05/07/2021   Pure hypercholesterolemia 05/07/2021     FAMILY HISTORY   Family History  Problem Relation Age of Onset   Hypertension Mother    Diabetes Father    Diabetes Sister    Kidney disease Sister    Hypertension Sister    Hypertension Sister    Stroke Sister    Hypertension Sister    Diabetes Maternal Aunt    Breast cancer Maternal Aunt     SOCIAL HISTORY:   Social History   Socioeconomic History   Marital status: Single    Spouse name: Not on file   Number of children: Not on file   Years of education: Not on file   Highest education level: Not on file  Occupational History   Not on file  Tobacco  Use   Smoking status: Some Days    Packs/day: 0.25    Years: 20.00    Total pack years: 5.00    Types: Cigarettes    Last attempt to quit: 03/24/2021    Years since quitting: 0.7   Smokeless tobacco: Never  Vaping Use   Vaping Use: Never used  Substance and Sexual Activity   Alcohol use: Not Currently    Alcohol/week: 3.0 standard drinks of alcohol    Types: 3 Cans of beer per week   Drug use: Not Currently    Types: Marijuana, Cocaine    Comment: has nout used in 6-7 months (used crack) 07/30/17 BJ   Sexual activity: Not Currently  Other Topics Concern   Not on file  Social History Narrative   Gets to work and then walks a lot.   Has to walk a lot at work.   Does not have license due to alcohol.    Smokes, 1/2 ppd. Smoked for over 20 years.    Social Determinants of Health   Financial Resource Strain: Low Risk  (05/07/2021)   Overall Financial Resource Strain (CARDIA)    Difficulty of Paying Living Expenses: Not hard at all  Food Insecurity: No Food Insecurity (05/07/2021)   Hunger Vital Sign    Worried About Running Out of Food in the Last Year: Never true    Ran Out of Food in the Last Year: Never  true  Transportation Needs: No Transportation Needs (05/07/2021)   PRAPARE - Hydrologist (Medical): No    Lack of Transportation (Non-Medical): No  Physical Activity: Inactive (05/07/2021)   Exercise Vital Sign    Days of Exercise per Week: 0 days    Minutes of Exercise per Session: 0 min  Stress: Not on file  Social Connections: Not on file  Intimate Partner Violence: Not on file    ALLERGIES:    Allergies  Allergen Reactions   Chlorthalidone     ELEVATED KIDNEY FUNCTION     CURRENT MEDICATIONS:    Current Outpatient Medications  Medication Sig Dispense Refill   acetaminophen (TYLENOL) 325 MG tablet Take 1-2 tablets (325-650 mg total) by mouth every 4 (four) hours as needed for mild pain.     amiodarone (PACERONE) 200 MG tablet Take  1 tablet (200 mg total) by mouth daily. 90 tablet 2   amLODipine (NORVASC) 10 MG tablet Take 10 mg by mouth daily.     aspirin 81 MG chewable tablet Chew 4 tablets (324 mg total) by mouth daily.     atorvastatin (LIPITOR) 80 MG tablet Take 1 tablet (80 mg total) by mouth daily. 90 tablet 2   Blood Pressure KIT MONITOR YOUR BLOOD PRESSURE TWICE A DAY  DX I10 1 kit 0   ferrous sulfate 325 (65 FE) MG tablet Take 1 tablet (325 mg total) by mouth daily with breakfast. 30 tablet 0   metoprolol tartrate (LOPRESSOR) 25 MG tablet Take 1 tablet (25 mg total) by mouth 2 (two) times daily. 180 tablet 2   Multiple Vitamin (MULTIVITAMIN WITH MINERALS) TABS tablet Take 1 tablet by mouth daily.     pantoprazole (PROTONIX) 40 MG tablet Take 1 tablet (40 mg total) by mouth daily. 30 tablet 3   sodium bicarbonate 650 MG tablet Take 1,300 mg by mouth 2 (two) times daily.     No current facility-administered medications for this visit.    REVIEW OF SYSTEMS:   [X] denotes positive finding, [ ] denotes negative finding Cardiac  Comments:  Chest pain or chest pressure:    Shortness of breath upon exertion:    Short of breath when lying flat:    Irregular heart rhythm:        Vascular    Pain in calf, thigh, or hip brought on by ambulation:    Pain in feet at night that wakes you up from your sleep:     Blood clot in your veins:    Leg swelling:         Pulmonary    Oxygen at home:    Productive cough:     Wheezing:         Neurologic    Sudden weakness in arms or legs:     Sudden numbness in arms or legs:     Sudden onset of difficulty speaking or slurred speech:    Temporary loss of vision in one eye:     Problems with dizziness:         Gastrointestinal    Blood in stool:      Vomited blood:         Genitourinary    Burning when urinating:     Blood in urine:        Psychiatric    Major depression:         Hematologic    Bleeding problems:    Problems with blood clotting too  easily:         Skin    Rashes or ulcers:        Constitutional    Fever or chills:     PHYSICAL EXAM:   There were no vitals filed for this visit.  GENERAL: The patient is a well-nourished female, in no acute distress. The vital signs are documented above. CARDIAC: There is a regular rate and rhythm.  VASCULAR: Palpable posterior tibial pulse bilaterally PULMONARY: Nonlabored respirations ABDOMEN: Soft and non-tender right lower quadrant ostomy MUSCULOSKELETAL: There are no major deformities or cyanosis. NEUROLOGIC: No focal weakness or paresthesias are detected. SKIN: There are no ulcers or rashes noted. PSYCHIATRIC: The patient has a normal affect.  STUDIES:   I have reviewed the following CT scan 1. Status post repair of the ascending aorta and great vessels for aortic dissection. No significant change in diffuse dilatation of the aortic arch, descending thoracic aorta and infrarenal abdominal aorta compared with postoperative study of 7 months prior. Consider continued follow-up CT/MR every 6 months. This recommendation follows ACR consensus guidelines: White Paper of the ACR Incidental Findings Committee II on Vascular Findings. J Am Coll Radiol 2013; 10:789-794. 2. No acute vascular findings, mediastinal or retroperitoneal hematoma identified on noncontrast imaging. 3. The aeration of the lung bases has improved compared with the prior CT. No acute findings identified in the chest. 4. Interval development of right peristomal and right periumbilical abdominal hernias containing small bowel. No evidence of incarceration or obstruction. 5. Coronary and aortic atherosclerosis (ICD10-I70.0). Emphysema (ICD10-J43.9). ASSESSMENT and PLAN   TAAA I discussed with the patient and her sister that her artery is dilated now to 6 cm.  This is at a point where we would consider repair to prevent rupture.  I think she is a stent graft candidate however her distal thoracic aorta is on the  large side.  I discussed proceeding with a left femoral approach and stent graft.  I will try to use IVUS for most of the procedure given her renal disease.  I discussed the risk of worsening renal insufficiency possibly leading to dialysis.  I discussed the risk of cardiovascular complications, femoral artery complications, bleeding, and paralysis.  She wants to get this done as soon as possible so that she can get her ostomy taken down.  AAA: Maximum diameter is 4.8 cm.  This will need to be addressed once it becomes greater than 5-5.5 cm.   Leia Alf, MD, FACS [facility] [phone] Pager [phone]

## 2022-01-06 NOTE — Progress Notes (Signed)
 Vascular and [facility]in Specialist of [place]  Patient name: Dorothy Harris MRN: [id number] DOB: 07/28/1960 Sex: female   REQUESTING PROVIDER:    Dr. Bartle   REASON FOR CONSULT:    TAAA  HISTORY OF PRESENT ILLNESS:   [name] is a 61 y.o. female, who is referred for evaluation of thoracic and abdominal aortic aneurysm.  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  She underwent open repair including aortic arch reconstruction under circulatory arrest.  Her postoperative course was complicated by an incarcerated ventral hernia with colon necrosis and perforation which required exploratory laparotomy, colon resection, and ileostomy with hernia repair.  Her dissection repair was done using a 30 x 10 by 8 x 8 x 10 Hemashield graft  The patient has progressed to stage IIIb renal insufficiency with a creatinine around 2.4.  She is medically managed for hypertension.  She is a smoker.  She takes a statin for hypercholesterolemia.  She is medically managed for hypertension  PAST MEDICAL HISTORY    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[facility])    Essential hypertension 05/07/2021   Hernia, epigastric    Hypertension    PAF (paroxysmal atrial fibrillation) ([facility]) 05/07/2021   Pure hypercholesterolemia 05/07/2021     FAMILY HISTORY   Family History  Problem Relation Age of Onset   Hypertension Mother    Diabetes Father    Diabetes Sister    Kidney disease Sister    Hypertension Sister    Hypertension Sister    Stroke Sister    Hypertension Sister    Diabetes Maternal Aunt    Breast cancer Maternal Aunt     SOCIAL HISTORY:   Social History   Socioeconomic History   Marital status: Single    Spouse name: Not on file   Number of children: Not on file   Years of education: Not on file   Highest education level: Not on file  Occupational History   Not on file  Tobacco  Use   Smoking status: Some Days    Packs/day: 0.25    Years: 20.00    Total pack years: 5.00    Types: Cigarettes    Last attempt to quit: 03/24/2021    Years since quitting: 0.7   Smokeless tobacco: Never  Vaping Use   Vaping Use: Never used  Substance and Sexual Activity   Alcohol use: Not Currently    Alcohol/week: 3.0 standard drinks of alcohol    Types: 3 Cans of beer per week   Drug use: Not Currently    Types: Marijuana, Cocaine    Comment: has nout used in 6-7 months (used crack) 07/30/17 BJ   Sexual activity: Not Currently  Other Topics Concern   Not on file  Social History Narrative   Gets to work and then walks a lot.   Has to walk a lot at work.   Does not have license due to alcohol.    Smokes, 1/2 ppd. Smoked for over 20 years.    Social Determinants of Health   Financial Resource Strain: Low Risk  (05/07/2021)   Overall Financial Resource Strain (CARDIA)    Difficulty of Paying Living Expenses: Not hard at all  Food Insecurity: No Food Insecurity (05/07/2021)   Hunger Vital Sign    Worried About Running Out of Food in the Last Year: Never true    Ran Out of Food in the Last Year: Never   true  Transportation Needs: No Transportation Needs (05/07/2021)   PRAPARE - Transportation    Lack of Transportation (Medical): No    Lack of Transportation (Non-Medical): No  Physical Activity: Inactive (05/07/2021)   Exercise Vital Sign    Days of Exercise per Week: 0 days    Minutes of Exercise per Session: 0 min  Stress: Not on file  Social Connections: Not on file  Intimate Partner Violence: Not on file    ALLERGIES:    Allergies  Allergen Reactions   Chlorthalidone     ELEVATED KIDNEY FUNCTION     CURRENT MEDICATIONS:    Current Outpatient Medications  Medication Sig Dispense Refill   acetaminophen (TYLENOL) 325 MG tablet Take 1-2 tablets (325-650 mg total) by mouth every 4 (four) hours as needed for mild pain.     amiodarone (PACERONE) 200 MG tablet Take  1 tablet (200 mg total) by mouth daily. 90 tablet 2   amLODipine (NORVASC) 10 MG tablet Take 10 mg by mouth daily.     aspirin 81 MG chewable tablet Chew 4 tablets (324 mg total) by mouth daily.     atorvastatin (LIPITOR) 80 MG tablet Take 1 tablet (80 mg total) by mouth daily. 90 tablet 2   Blood Pressure KIT MONITOR YOUR BLOOD PRESSURE TWICE A DAY  DX I10 1 kit 0   ferrous sulfate 325 (65 FE) MG tablet Take 1 tablet (325 mg total) by mouth daily with breakfast. 30 tablet 0   metoprolol tartrate (LOPRESSOR) 25 MG tablet Take 1 tablet (25 mg total) by mouth 2 (two) times daily. 180 tablet 2   Multiple Vitamin (MULTIVITAMIN WITH MINERALS) TABS tablet Take 1 tablet by mouth daily.     pantoprazole (PROTONIX) 40 MG tablet Take 1 tablet (40 mg total) by mouth daily. 30 tablet 3   sodium bicarbonate 650 MG tablet Take 1,300 mg by mouth 2 (two) times daily.     No current facility-administered medications for this visit.    REVIEW OF SYSTEMS:   [X] denotes positive finding, [ ] denotes negative finding Cardiac  Comments:  Chest pain or chest pressure:    Shortness of breath upon exertion:    Short of breath when lying flat:    Irregular heart rhythm:        Vascular    Pain in calf, thigh, or hip brought on by ambulation:    Pain in feet at night that wakes you up from your sleep:     Blood clot in your veins:    Leg swelling:         Pulmonary    Oxygen at home:    Productive cough:     Wheezing:         Neurologic    Sudden weakness in arms or legs:     Sudden numbness in arms or legs:     Sudden onset of difficulty speaking or slurred speech:    Temporary loss of vision in one eye:     Problems with dizziness:         Gastrointestinal    Blood in stool:      Vomited blood:         Genitourinary    Burning when urinating:     Blood in urine:        Psychiatric    Major depression:         Hematologic    Bleeding problems:    Problems with blood clotting too   easily:         Skin    Rashes or ulcers:        Constitutional    Fever or chills:     PHYSICAL EXAM:   There were no vitals filed for this visit.  GENERAL: The patient is a well-nourished female, in no acute distress. The vital signs are documented above. CARDIAC: There is a regular rate and rhythm.  VASCULAR: Palpable posterior tibial pulse bilaterally PULMONARY: Nonlabored respirations ABDOMEN: Soft and non-tender right lower quadrant ostomy MUSCULOSKELETAL: There are no major deformities or cyanosis. NEUROLOGIC: No focal weakness or paresthesias are detected. SKIN: There are no ulcers or rashes noted. PSYCHIATRIC: The patient has a normal affect.  STUDIES:   I have reviewed the following CT scan 1. Status post repair of the ascending aorta and great vessels for aortic dissection. No significant change in diffuse dilatation of the aortic arch, descending thoracic aorta and infrarenal abdominal aorta compared with postoperative study of 7 months prior. Consider continued follow-up CT/MR every 6 months. This recommendation follows ACR consensus guidelines: White Paper of the ACR Incidental Findings Committee II on Vascular Findings. J Am Coll Radiol 2013; 10:789-794. 2. No acute vascular findings, mediastinal or retroperitoneal hematoma identified on noncontrast imaging. 3. The aeration of the lung bases has improved compared with the prior CT. No acute findings identified in the chest. 4. Interval development of right peristomal and right periumbilical abdominal hernias containing small bowel. No evidence of incarceration or obstruction. 5. Coronary and aortic atherosclerosis (ICD10-I70.0). Emphysema (ICD10-J43.9). ASSESSMENT and PLAN   TAAA I discussed with the patient and her sister that her artery is dilated now to 6 cm.  This is at a point where we would consider repair to prevent rupture.  I think she is a stent graft candidate however her distal thoracic aorta is on the  large side.  I discussed proceeding with a left femoral approach and stent graft.  I will try to use IVUS for most of the procedure given her renal disease.  I discussed the risk of worsening renal insufficiency possibly leading to dialysis.  I discussed the risk of cardiovascular complications, femoral artery complications, bleeding, and paralysis.  She wants to get this done as soon as possible so that she can get her ostomy taken down.  AAA: Maximum diameter is 4.8 cm.  This will need to be addressed once it becomes greater than 5-5.5 cm.   Wells Torrance Frech, IV, MD, FACS [facility] [place] Tel [phone] Pager [phone]  

## 2022-01-07 ENCOUNTER — Telehealth: Payer: Self-pay | Admitting: Nurse Practitioner

## 2022-01-07 NOTE — Telephone Encounter (Signed)
Dr. Tarri Glenn, refer to office visit 01/01/2022. Patient saw vascular surgeon Dr. Trula Slade on 01/06/2022, pls review that consult note. She is scheduled for thoracic aortic aneurysm repair on 01/15/2022,  therefore a colonoscopy deferred at this time. Do you want patient to schedule a follow up appointment with you in 2 months to further discuss appropriate timing for a colonoscopy which is required prior to eventual ostomy take down surgery?

## 2022-01-08 ENCOUNTER — Encounter: Payer: Self-pay | Admitting: Nurse Practitioner

## 2022-01-08 NOTE — Telephone Encounter (Signed)
Left message for pt to call back  °

## 2022-01-09 NOTE — Telephone Encounter (Signed)
Left message for pt to call back  °

## 2022-01-10 NOTE — Telephone Encounter (Signed)
Left message for pt to call back  °

## 2022-01-13 ENCOUNTER — Encounter (HOSPITAL_COMMUNITY): Payer: Self-pay | Admitting: Surgery

## 2022-01-13 ENCOUNTER — Other Ambulatory Visit: Payer: Self-pay

## 2022-01-13 NOTE — Telephone Encounter (Signed)
Left message for pt to call back  °

## 2022-01-13 NOTE — Progress Notes (Signed)
Spoke with pt for pre-op call. Pt denies cardiac history (MI, irregular heart rate, murmur). Pt does have a thoracic and abdominal aortic aneurysm. Pt does have a colostomy. She states she is not diabetic, but is treated for HTN.   Shower instructions given to pt and she voiced understanding.

## 2022-01-14 NOTE — Anesthesia Preprocedure Evaluation (Signed)
Anesthesia Evaluation  Patient identified by MRN, date of birth, ID band Patient awake    Reviewed: Allergy & Precautions, H&P , NPO status , Patient's Chart, lab work & pertinent test results  Airway Mallampati: II  TM Distance: >3 FB Neck ROM: Full    Dental no notable dental hx. (+) Poor Dentition, Dental Advisory Given   Pulmonary Current Smoker and Patient abstained from smoking.   Pulmonary exam normal breath sounds clear to auscultation       Cardiovascular Exercise Tolerance: Good hypertension, Pt. on medications and Pt. on home beta blockers + CAD and +CHF  + dysrhythmias Atrial Fibrillation  Rhythm:Regular Rate:Normal     Neuro/Psych    Depression    negative neurological ROS     GI/Hepatic Neg liver ROS,GERD  Medicated,,  Endo/Other  negative endocrine ROS    Renal/GU Renal InsufficiencyRenal disease  negative genitourinary   Musculoskeletal   Abdominal   Peds  Hematology  (+) Blood dyscrasia, anemia   Anesthesia Other Findings   Reproductive/Obstetrics negative OB ROS                             Anesthesia Physical Anesthesia Plan  ASA: 3  Anesthesia Plan: General   Post-op Pain Management: Tylenol PO (pre-op)*   Induction: Intravenous  PONV Risk Score and Plan: 3 and Ondansetron, Dexamethasone and Midazolam  Airway Management Planned: Oral ETT  Additional Equipment: Arterial line  Intra-op Plan:   Post-operative Plan: Extubation in OR  Informed Consent: I have reviewed the patients History and Physical, chart, labs and discussed the procedure including the risks, benefits and alternatives for the proposed anesthesia with the patient or authorized representative who has indicated his/her understanding and acceptance.     Dental advisory given  Plan Discussed with: CRNA  Anesthesia Plan Comments:         Anesthesia Quick Evaluation

## 2022-01-14 NOTE — Telephone Encounter (Signed)
Left message for pt to call back: My chart message was sent to pt along with letter sent in mail with Carl Best NP and Dr. Tarri Glenn recommendations:

## 2022-01-15 ENCOUNTER — Inpatient Hospital Stay (HOSPITAL_COMMUNITY): Payer: PRIVATE HEALTH INSURANCE | Admitting: Anesthesiology

## 2022-01-15 ENCOUNTER — Other Ambulatory Visit: Payer: Self-pay

## 2022-01-15 ENCOUNTER — Inpatient Hospital Stay (HOSPITAL_COMMUNITY): Payer: PRIVATE HEALTH INSURANCE

## 2022-01-15 ENCOUNTER — Encounter (HOSPITAL_COMMUNITY)
Admission: RE | Disposition: A | Discharge status: Home / Self Care | Payer: Self-pay | Source: Home / Self Care | Attending: Surgery

## 2022-01-15 ENCOUNTER — Encounter (HOSPITAL_COMMUNITY): Payer: Self-pay | Admitting: Surgery

## 2022-01-15 ENCOUNTER — Inpatient Hospital Stay (HOSPITAL_COMMUNITY)
Admission: RE | Admit: 2022-01-15 | Discharge: 2022-01-16 | DRG: 219 | Disposition: A | Discharge status: Home / Self Care | Payer: PRIVATE HEALTH INSURANCE | Attending: Surgery | Admitting: Surgery

## 2022-01-15 DIAGNOSIS — Z841 Family history of disorders of kidney and ureter: Secondary | ICD-10-CM

## 2022-01-15 DIAGNOSIS — Z8249 Family history of ischemic heart disease and other diseases of the circulatory system: Secondary | ICD-10-CM | POA: Diagnosis not present

## 2022-01-15 DIAGNOSIS — F17200 Nicotine dependence, unspecified, uncomplicated: Secondary | ICD-10-CM | POA: Diagnosis present

## 2022-01-15 DIAGNOSIS — I480 Paroxysmal atrial fibrillation: Secondary | ICD-10-CM | POA: Diagnosis present

## 2022-01-15 DIAGNOSIS — K436 Other and unspecified ventral hernia with obstruction, without gangrene: Secondary | ICD-10-CM | POA: Diagnosis present

## 2022-01-15 DIAGNOSIS — N189 Chronic kidney disease, unspecified: Secondary | ICD-10-CM | POA: Diagnosis present

## 2022-01-15 DIAGNOSIS — Z803 Family history of malignant neoplasm of breast: Secondary | ICD-10-CM

## 2022-01-15 DIAGNOSIS — I2510 Atherosclerotic heart disease of native coronary artery without angina pectoris: Secondary | ICD-10-CM

## 2022-01-15 DIAGNOSIS — I129 Hypertensive chronic kidney disease with stage 1 through stage 4 chronic kidney disease, or unspecified chronic kidney disease: Secondary | ICD-10-CM | POA: Diagnosis present

## 2022-01-15 DIAGNOSIS — I714 Abdominal aortic aneurysm, without rupture, unspecified: Secondary | ICD-10-CM | POA: Diagnosis present

## 2022-01-15 DIAGNOSIS — I7143 Infrarenal abdominal aortic aneurysm, without rupture: Secondary | ICD-10-CM

## 2022-01-15 DIAGNOSIS — I509 Heart failure, unspecified: Secondary | ICD-10-CM | POA: Diagnosis not present

## 2022-01-15 DIAGNOSIS — Z7982 Long term (current) use of aspirin: Secondary | ICD-10-CM | POA: Diagnosis not present

## 2022-01-15 DIAGNOSIS — F17210 Nicotine dependence, cigarettes, uncomplicated: Secondary | ICD-10-CM

## 2022-01-15 DIAGNOSIS — Z79899 Other long term (current) drug therapy: Secondary | ICD-10-CM | POA: Diagnosis not present

## 2022-01-15 DIAGNOSIS — K55049 Acute infarction of large intestine, extent unspecified: Secondary | ICD-10-CM | POA: Diagnosis present

## 2022-01-15 DIAGNOSIS — K219 Gastro-esophageal reflux disease without esophagitis: Secondary | ICD-10-CM | POA: Diagnosis present

## 2022-01-15 DIAGNOSIS — I712 Thoracic aortic aneurysm, without rupture, unspecified: Principal | ICD-10-CM | POA: Diagnosis present

## 2022-01-15 DIAGNOSIS — Z833 Family history of diabetes mellitus: Secondary | ICD-10-CM | POA: Diagnosis not present

## 2022-01-15 DIAGNOSIS — E7800 Pure hypercholesterolemia, unspecified: Secondary | ICD-10-CM | POA: Diagnosis present

## 2022-01-15 DIAGNOSIS — Z888 Allergy status to other drugs, medicaments and biological substances status: Secondary | ICD-10-CM | POA: Diagnosis not present

## 2022-01-15 DIAGNOSIS — I716 Thoracoabdominal aortic aneurysm, without rupture, unspecified: Secondary | ICD-10-CM

## 2022-01-15 DIAGNOSIS — I110 Hypertensive heart disease with heart failure: Secondary | ICD-10-CM | POA: Diagnosis not present

## 2022-01-15 DIAGNOSIS — I7123 Aneurysm of the descending thoracic aorta, without rupture: Secondary | ICD-10-CM

## 2022-01-15 DIAGNOSIS — Z823 Family history of stroke: Secondary | ICD-10-CM

## 2022-01-15 HISTORY — PX: THORACIC AORTIC ENDOVASCULAR STENT GRAFT: SHX6112

## 2022-01-15 HISTORY — PX: ULTRASOUND GUIDANCE FOR VASCULAR ACCESS: SHX6516

## 2022-01-15 HISTORY — DX: Gastro-esophageal reflux disease without esophagitis: K21.9

## 2022-01-15 HISTORY — DX: Chronic kidney disease, unspecified: N18.9

## 2022-01-15 HISTORY — DX: Anemia, unspecified: D64.9

## 2022-01-15 HISTORY — PX: ENDARTERECTOMY FEMORAL: SHX5804

## 2022-01-15 HISTORY — PX: PATCH ANGIOPLASTY: SHX6230

## 2022-01-15 HISTORY — PX: LOWER EXTREMITY ANGIOGRAM: SHX5508

## 2022-01-15 LAB — COMPREHENSIVE METABOLIC PANEL
ALT: 39 U/L (ref 0–44)
AST: 32 U/L (ref 15–41)
Albumin: 4.1 g/dL (ref 3.5–5.0)
Alkaline Phosphatase: 108 U/L (ref 38–126)
Anion gap: 9 (ref 5–15)
BUN: 30 mg/dL — ABNORMAL HIGH (ref 6–20)
CO2: 19 mmol/L — ABNORMAL LOW (ref 22–32)
Calcium: 9.3 mg/dL (ref 8.9–10.3)
Chloride: 112 mmol/L — ABNORMAL HIGH (ref 98–111)
Creatinine, Ser: 2.56 mg/dL — ABNORMAL HIGH (ref 0.44–1.00)
GFR, Estimated: 21 mL/min — ABNORMAL LOW (ref >60)
Glucose, Bld: 92 mg/dL (ref 70–99)
Potassium: 5 mmol/L (ref 3.5–5.1)
Sodium: 140 mmol/L (ref 135–145)
Total Bilirubin: 0.9 mg/dL (ref 0.3–1.2)
Total Protein: 7.1 g/dL (ref 6.5–8.1)

## 2022-01-15 LAB — BASIC METABOLIC PANEL
Anion gap: 7 (ref 5–15)
BUN: 29 mg/dL — ABNORMAL HIGH (ref 6–20)
CO2: 16 mmol/L — ABNORMAL LOW (ref 22–32)
Calcium: 8.2 mg/dL — ABNORMAL LOW (ref 8.9–10.3)
Chloride: 114 mmol/L — ABNORMAL HIGH (ref 98–111)
Creatinine, Ser: 2.34 mg/dL — ABNORMAL HIGH (ref 0.44–1.00)
GFR, Estimated: 23 mL/min — ABNORMAL LOW (ref >60)
Glucose, Bld: 162 mg/dL — ABNORMAL HIGH (ref 70–99)
Potassium: 5.2 mmol/L — ABNORMAL HIGH (ref 3.5–5.1)
Sodium: 137 mmol/L (ref 135–145)

## 2022-01-15 LAB — CBC
HCT: 28.3 % — ABNORMAL LOW (ref 36.0–46.0)
Hemoglobin: 8.8 g/dL — ABNORMAL LOW (ref 12.0–15.0)
MCH: 33.1 pg (ref 26.0–34.0)
MCHC: 31.1 g/dL (ref 30.0–36.0)
MCV: 106.4 fL — ABNORMAL HIGH (ref 80.0–100.0)
Platelets: 182 10*3/uL (ref 150–400)
RBC: 2.66 MIL/uL — ABNORMAL LOW (ref 3.87–5.11)
RDW: 16.6 % — ABNORMAL HIGH (ref 11.5–15.5)
WBC: 13.6 10*3/uL — ABNORMAL HIGH (ref 4.0–10.5)
nRBC: 0.3 % — ABNORMAL HIGH (ref 0.0–0.2)

## 2022-01-15 LAB — URINALYSIS, ROUTINE W REFLEX MICROSCOPIC
Bilirubin Urine: NEGATIVE
Glucose, UA: NEGATIVE mg/dL
Hgb urine dipstick: NEGATIVE
Ketones, ur: NEGATIVE mg/dL
Leukocytes,Ua: NEGATIVE
Nitrite: NEGATIVE
Protein, ur: NEGATIVE mg/dL
Specific Gravity, Urine: 1.013 (ref 1.005–1.030)
pH: 5 (ref 5.0–8.0)

## 2022-01-15 LAB — SURGICAL PCR SCREEN
MRSA, PCR: NEGATIVE
Staphylococcus aureus: POSITIVE — AB

## 2022-01-15 LAB — APTT
aPTT: 35 seconds (ref 24–36)
aPTT: 36 seconds (ref 24–36)

## 2022-01-15 LAB — PROTIME-INR
INR: 1 (ref 0.8–1.2)
INR: 1.1 (ref 0.8–1.2)
Prothrombin Time: 13.3 seconds (ref 11.4–15.2)
Prothrombin Time: 14.5 seconds (ref 11.4–15.2)

## 2022-01-15 LAB — MAGNESIUM: Magnesium: 2 mg/dL (ref 1.7–2.4)

## 2022-01-15 SURGERY — INSERTION, ENDOVASCULAR STENT GRAFT, AORTA, THORACIC
Anesthesia: General | Site: Leg Lower

## 2022-01-15 MED ORDER — PROPOFOL 10 MG/ML IV BOLUS
INTRAVENOUS | Status: AC
  Filled 2022-01-15: qty 20

## 2022-01-15 MED ORDER — HYDRALAZINE HCL 20 MG/ML IJ SOLN: 5.0000 mg | INTRAMUSCULAR | Status: DC | PRN

## 2022-01-15 MED ORDER — CHLORHEXIDINE GLUCONATE CLOTH 2 % EX PADS: 6.0000 | MEDICATED_PAD | Freq: Once | CUTANEOUS | Status: DC

## 2022-01-15 MED ORDER — MIDAZOLAM HCL 2 MG/2ML IJ SOLN
INTRAMUSCULAR | Status: AC
  Filled 2022-01-15: qty 2

## 2022-01-15 MED ORDER — FENTANYL CITRATE (PF) 250 MCG/5ML IJ SOLN
INTRAMUSCULAR | Status: AC
  Filled 2022-01-15: qty 5

## 2022-01-15 MED ORDER — HEPARIN SODIUM (PORCINE) 1000 UNIT/ML IJ SOLN
INTRAMUSCULAR | Status: DC | PRN
  Administered 2022-01-15: 1000 [IU] via INTRAVENOUS
  Administered 2022-01-15: 8000 [IU] via INTRAVENOUS
  Administered 2022-01-15: 3000 [IU] via INTRAVENOUS
  Administered 2022-01-15 (×2): 2000 [IU] via INTRAVENOUS
  Administered 2022-01-15: 8300 [IU] via INTRAVENOUS

## 2022-01-15 MED ORDER — DEXAMETHASONE SODIUM PHOSPHATE 10 MG/ML IJ SOLN
INTRAMUSCULAR | Status: DC | PRN
  Administered 2022-01-15: 10 mg via INTRAVENOUS

## 2022-01-15 MED ORDER — PANTOPRAZOLE SODIUM 40 MG PO TBEC
40.0000 mg | DELAYED_RELEASE_TABLET | Freq: Every day | ORAL | Status: DC
  Administered 2022-01-15 – 2022-01-16 (×2): 40 mg via ORAL
  Filled 2022-01-15 (×2): qty 1

## 2022-01-15 MED ORDER — SODIUM CHLORIDE 0.9 % IV SOLN: 500.0000 mL | Freq: Once | INTRAVENOUS | Status: DC | PRN

## 2022-01-15 MED ORDER — PHENOL 1.4 % MT LIQD: 1.0000 | OROMUCOSAL | Status: DC | PRN

## 2022-01-15 MED ORDER — ORAL CARE MOUTH RINSE: 15.0000 mL | Freq: Once | OROMUCOSAL | Status: AC

## 2022-01-15 MED ORDER — PROTAMINE SULFATE 10 MG/ML IV SOLN
INTRAVENOUS | Status: AC
  Filled 2022-01-15: qty 15

## 2022-01-15 MED ORDER — LIDOCAINE 2% (20 MG/ML) 5 ML SYRINGE
INTRAMUSCULAR | Status: AC
  Filled 2022-01-15: qty 10

## 2022-01-15 MED ORDER — AMIODARONE HCL 200 MG PO TABS
200.0000 mg | ORAL_TABLET | Freq: Every day | ORAL | Status: DC
  Administered 2022-01-16: 200 mg via ORAL
  Filled 2022-01-15: qty 1

## 2022-01-15 MED ORDER — DOCUSATE SODIUM 100 MG PO CAPS
100.0000 mg | ORAL_CAPSULE | Freq: Every day | ORAL | Status: DC
  Administered 2022-01-16: 100 mg via ORAL
  Filled 2022-01-15: qty 1

## 2022-01-15 MED ORDER — SODIUM CHLORIDE 0.9 % IV SOLN: INTRAVENOUS | Status: DC

## 2022-01-15 MED ORDER — HEPARIN SODIUM (PORCINE) 5000 UNIT/ML IJ SOLN
5000.0000 [IU] | Freq: Three times a day (TID) | INTRAMUSCULAR | Status: DC
  Administered 2022-01-16: 5000 [IU] via SUBCUTANEOUS
  Filled 2022-01-15: qty 1

## 2022-01-15 MED ORDER — PROPOFOL 1000 MG/100ML IV EMUL
INTRAVENOUS | Status: AC
  Filled 2022-01-15: qty 100

## 2022-01-15 MED ORDER — HEPARIN 6000 UNIT IRRIGATION SOLUTION
Status: DC | PRN
  Administered 2022-01-15 (×2): 1

## 2022-01-15 MED ORDER — HEPARIN 6000 UNIT IRRIGATION SOLUTION
Status: AC
  Filled 2022-01-15: qty 500

## 2022-01-15 MED ORDER — POTASSIUM CHLORIDE CRYS ER 20 MEQ PO TBCR: 20.0000 meq | EXTENDED_RELEASE_TABLET | Freq: Every day | ORAL | Status: DC | PRN

## 2022-01-15 MED ORDER — LIDOCAINE 2% (20 MG/ML) 5 ML SYRINGE
INTRAMUSCULAR | Status: DC | PRN
  Administered 2022-01-15: 80 mg via INTRAVENOUS

## 2022-01-15 MED ORDER — MAGNESIUM SULFATE 2 GM/50ML IV SOLN: 2.0000 g | Freq: Every day | INTRAVENOUS | Status: DC | PRN

## 2022-01-15 MED ORDER — BISACODYL 5 MG PO TBEC: 5.0000 mg | DELAYED_RELEASE_TABLET | Freq: Every day | ORAL | Status: DC | PRN

## 2022-01-15 MED ORDER — SODIUM CHLORIDE 0.9 % IV SOLN: INTRAVENOUS | Status: DC | PRN

## 2022-01-15 MED ORDER — SUGAMMADEX SODIUM 200 MG/2ML IV SOLN
INTRAVENOUS | Status: DC | PRN
  Administered 2022-01-15: 100 mg via INTRAVENOUS

## 2022-01-15 MED ORDER — PROPOFOL 10 MG/ML IV BOLUS
INTRAVENOUS | Status: DC | PRN
  Administered 2022-01-15: 120 mg via INTRAVENOUS
  Administered 2022-01-15: 30 mg via INTRAVENOUS

## 2022-01-15 MED ORDER — ASPIRIN 81 MG PO CHEW
324.0000 mg | CHEWABLE_TABLET | Freq: Every day | ORAL | Status: DC
  Administered 2022-01-16: 324 mg via ORAL
  Filled 2022-01-15: qty 4

## 2022-01-15 MED ORDER — ACETAMINOPHEN 500 MG PO TABS
1000.0000 mg | ORAL_TABLET | Freq: Once | ORAL | Status: AC
  Administered 2022-01-15: 1000 mg via ORAL
  Filled 2022-01-15: qty 2

## 2022-01-15 MED ORDER — CEFAZOLIN SODIUM-DEXTROSE 2-4 GM/100ML-% IV SOLN
2.0000 g | Freq: Three times a day (TID) | INTRAVENOUS | Status: AC
  Administered 2022-01-15 – 2022-01-16 (×2): 2 g via INTRAVENOUS
  Filled 2022-01-15 (×2): qty 100

## 2022-01-15 MED ORDER — PHENYLEPHRINE HCL-NACL 20-0.9 MG/250ML-% IV SOLN
INTRAVENOUS | Status: AC
  Filled 2022-01-15: qty 500

## 2022-01-15 MED ORDER — CHLORHEXIDINE GLUCONATE 0.12 % MT SOLN
15.0000 mL | Freq: Once | OROMUCOSAL | Status: AC
  Administered 2022-01-15: 15 mL via OROMUCOSAL
  Filled 2022-01-15: qty 15

## 2022-01-15 MED ORDER — PROTAMINE SULFATE 10 MG/ML IV SOLN
INTRAVENOUS | Status: DC | PRN
  Administered 2022-01-15 (×2): 50 mg via INTRAVENOUS
  Administered 2022-01-15: 10 mg via INTRAVENOUS

## 2022-01-15 MED ORDER — SUCCINYLCHOLINE CHLORIDE 200 MG/10ML IV SOSY
PREFILLED_SYRINGE | INTRAVENOUS | Status: AC
  Filled 2022-01-15: qty 10

## 2022-01-15 MED ORDER — ROCURONIUM BROMIDE 10 MG/ML (PF) SYRINGE
PREFILLED_SYRINGE | INTRAVENOUS | Status: AC
  Filled 2022-01-15: qty 20

## 2022-01-15 MED ORDER — OXYCODONE HCL 5 MG PO TABS
5.0000 mg | ORAL_TABLET | ORAL | Status: DC | PRN
  Administered 2022-01-15 – 2022-01-16 (×3): 10 mg via ORAL
  Filled 2022-01-15 (×3): qty 2

## 2022-01-15 MED ORDER — SODIUM BICARBONATE 650 MG PO TABS
1300.0000 mg | ORAL_TABLET | Freq: Two times a day (BID) | ORAL | Status: DC
  Administered 2022-01-15 – 2022-01-16 (×2): 1300 mg via ORAL
  Filled 2022-01-15 (×2): qty 2

## 2022-01-15 MED ORDER — PANTOPRAZOLE SODIUM 40 MG PO TBEC: 40.0000 mg | DELAYED_RELEASE_TABLET | Freq: Every day | ORAL | Status: DC

## 2022-01-15 MED ORDER — AMLODIPINE BESYLATE 10 MG PO TABS
10.0000 mg | ORAL_TABLET | Freq: Every day | ORAL | Status: DC
  Filled 2022-01-15: qty 1

## 2022-01-15 MED ORDER — GUAIFENESIN-DM 100-10 MG/5ML PO SYRP: 15.0000 mL | ORAL_SOLUTION | ORAL | Status: DC | PRN

## 2022-01-15 MED ORDER — EPHEDRINE 5 MG/ML INJ
INTRAVENOUS | Status: AC
  Filled 2022-01-15: qty 10

## 2022-01-15 MED ORDER — CEFAZOLIN SODIUM-DEXTROSE 2-4 GM/100ML-% IV SOLN
2.0000 g | INTRAVENOUS | Status: AC
  Administered 2022-01-15: 2 g via INTRAVENOUS
  Filled 2022-01-15: qty 100

## 2022-01-15 MED ORDER — IODIXANOL 320 MG/ML IV SOLN
INTRAVENOUS | Status: DC | PRN
  Administered 2022-01-15: 51.35 mL

## 2022-01-15 MED ORDER — CHLORHEXIDINE GLUCONATE CLOTH 2 % EX PADS
6.0000 | MEDICATED_PAD | Freq: Once | CUTANEOUS | Status: AC
  Administered 2022-01-15: 6 via TOPICAL

## 2022-01-15 MED ORDER — PHENYLEPHRINE HCL-NACL 20-0.9 MG/250ML-% IV SOLN
INTRAVENOUS | Status: DC | PRN
  Administered 2022-01-15: 15 ug/min via INTRAVENOUS

## 2022-01-15 MED ORDER — HYDROMORPHONE HCL 1 MG/ML IJ SOLN
0.5000 mg | INTRAMUSCULAR | Status: DC | PRN
  Administered 2022-01-15: 1 mg via INTRAVENOUS
  Filled 2022-01-15: qty 1

## 2022-01-15 MED ORDER — METOPROLOL TARTRATE 5 MG/5ML IV SOLN: 2.0000 mg | INTRAVENOUS | Status: DC | PRN

## 2022-01-15 MED ORDER — ONDANSETRON HCL 4 MG/2ML IJ SOLN: 4.0000 mg | Freq: Four times a day (QID) | INTRAMUSCULAR | Status: DC | PRN

## 2022-01-15 MED ORDER — LABETALOL HCL 5 MG/ML IV SOLN: 10.0000 mg | INTRAVENOUS | Status: DC | PRN

## 2022-01-15 MED ORDER — HEMOSTATIC AGENTS (NO CHARGE) OPTIME
TOPICAL | Status: DC | PRN
  Administered 2022-01-15 (×2): 1 via TOPICAL

## 2022-01-15 MED ORDER — 0.9 % SODIUM CHLORIDE (POUR BTL) OPTIME
TOPICAL | Status: DC | PRN
  Administered 2022-01-15: 1000 mL
  Administered 2022-01-15: 2000 mL

## 2022-01-15 MED ORDER — DEXAMETHASONE SODIUM PHOSPHATE 10 MG/ML IJ SOLN
INTRAMUSCULAR | Status: AC
  Filled 2022-01-15: qty 2

## 2022-01-15 MED ORDER — ONDANSETRON HCL 4 MG/2ML IJ SOLN
INTRAMUSCULAR | Status: AC
  Filled 2022-01-15: qty 4

## 2022-01-15 MED ORDER — PHENYLEPHRINE 80 MCG/ML (10ML) SYRINGE FOR IV PUSH (FOR BLOOD PRESSURE SUPPORT)
PREFILLED_SYRINGE | INTRAVENOUS | Status: AC
  Filled 2022-01-15: qty 10

## 2022-01-15 MED ORDER — METOPROLOL TARTRATE 25 MG PO TABS
25.0000 mg | ORAL_TABLET | Freq: Two times a day (BID) | ORAL | Status: DC
  Administered 2022-01-15 – 2022-01-16 (×2): 25 mg via ORAL
  Filled 2022-01-15 (×2): qty 1

## 2022-01-15 MED ORDER — ACETAMINOPHEN 325 MG PO TABS
325.0000 mg | ORAL_TABLET | ORAL | Status: DC | PRN
  Administered 2022-01-16 (×2): 650 mg via ORAL
  Filled 2022-01-15 (×2): qty 2

## 2022-01-15 MED ORDER — ALUM & MAG HYDROXIDE-SIMETH 200-200-20 MG/5ML PO SUSP: 15.0000 mL | ORAL | Status: DC | PRN

## 2022-01-15 MED ORDER — ATORVASTATIN CALCIUM 80 MG PO TABS
80.0000 mg | ORAL_TABLET | Freq: Every day | ORAL | Status: DC
  Administered 2022-01-15 – 2022-01-16 (×2): 80 mg via ORAL
  Filled 2022-01-15 (×2): qty 1

## 2022-01-15 MED ORDER — SENNOSIDES-DOCUSATE SODIUM 8.6-50 MG PO TABS: 1.0000 | ORAL_TABLET | Freq: Every evening | ORAL | Status: DC | PRN

## 2022-01-15 MED ORDER — EPHEDRINE SULFATE-NACL 50-0.9 MG/10ML-% IV SOSY
PREFILLED_SYRINGE | INTRAVENOUS | Status: DC | PRN
  Administered 2022-01-15: 5 mg via INTRAVENOUS
  Administered 2022-01-15: 2.5 mg via INTRAVENOUS
  Administered 2022-01-15: 5 mg via INTRAVENOUS
  Administered 2022-01-15: 2.5 mg via INTRAVENOUS

## 2022-01-15 MED ORDER — FENTANYL CITRATE (PF) 250 MCG/5ML IJ SOLN
INTRAMUSCULAR | Status: DC | PRN
  Administered 2022-01-15 (×6): 50 ug via INTRAVENOUS
  Administered 2022-01-15: 100 ug via INTRAVENOUS

## 2022-01-15 MED ORDER — ONDANSETRON HCL 4 MG/2ML IJ SOLN
INTRAMUSCULAR | Status: DC | PRN
  Administered 2022-01-15: 4 mg via INTRAVENOUS

## 2022-01-15 MED ORDER — ROCURONIUM BROMIDE 10 MG/ML (PF) SYRINGE
PREFILLED_SYRINGE | INTRAVENOUS | Status: DC | PRN
  Administered 2022-01-15 (×2): 20 mg via INTRAVENOUS
  Administered 2022-01-15: 60 mg via INTRAVENOUS

## 2022-01-15 MED ORDER — FERROUS SULFATE 325 (65 FE) MG PO TABS
325.0000 mg | ORAL_TABLET | Freq: Every day | ORAL | Status: DC
  Administered 2022-01-16: 325 mg via ORAL
  Filled 2022-01-15: qty 1

## 2022-01-15 SURGICAL SUPPLY — 91 items
ADH SKN CLS APL DERMABOND .7 (GAUZE/BANDAGES/DRESSINGS) ×3
AGENT HMST KT MTR STRL THRMB (HEMOSTASIS) ×6
BAG COUNTER SPONGE SURGICOUNT (BAG) ×3 IMPLANT
BAG DECANTER FOR FLEXI CONT (MISCELLANEOUS) IMPLANT
BAG SPNG CNTER NS LX DISP (BAG) ×3
CANISTER SUCT 3000ML PPV (MISCELLANEOUS) ×3 IMPLANT
CATH ACCU-VU SIZ PIG 5F 100CM (CATHETERS) ×3 IMPLANT
CATH EMB 3FR 80CM (CATHETERS) IMPLANT
CATH EMB 4FR 80CM (CATHETERS) IMPLANT
CATH VISIONS PV .035 IVUS (CATHETERS) IMPLANT
CLIP TI MEDIUM 6 (CLIP) IMPLANT
CLIP TI WIDE RED SMALL 6 (CLIP) IMPLANT
CLOSURE PERCLOSE PROSTYLE (VASCULAR PRODUCTS) IMPLANT
COVER PROBE W GEL 5X96 (DRAPES) ×3 IMPLANT
DERMABOND ADVANCED .7 DNX12 (GAUZE/BANDAGES/DRESSINGS) ×3 IMPLANT
DRAPE U-SHAPE 47X51 STRL (DRAPES) IMPLANT
DRAPE ZERO GRAVITY STERILE (DRAPES) IMPLANT
DRSG TEGADERM 2-3/8X2-3/4 SM (GAUZE/BANDAGES/DRESSINGS) ×3 IMPLANT
DRSG TEGADERM 4X4.75 (GAUZE/BANDAGES/DRESSINGS) IMPLANT
DRYSEAL FLEXSHEATH 24FR 33CM (SHEATH) ×3
ELECT BLADE 4.0 EZ CLEAN MEGAD (MISCELLANEOUS) ×3
ELECT CAUTERY BLADE 6.4 (BLADE) IMPLANT
ELECT REM PT RETURN 9FT ADLT (ELECTROSURGICAL) ×6
ELECTRODE BLDE 4.0 EZ CLN MEGD (MISCELLANEOUS) IMPLANT
ELECTRODE REM PT RTRN 9FT ADLT (ELECTROSURGICAL) ×6 IMPLANT
FELT TEFLON 1X6 (MISCELLANEOUS) IMPLANT
GLOVE BIO SURGEON STRL SZ7 (GLOVE) IMPLANT
GLOVE BIOGEL PI IND STRL 6.5 (GLOVE) IMPLANT
GLOVE BIOGEL PI IND STRL 7.5 (GLOVE) IMPLANT
GLOVE ECLIPSE 7.0 STRL STRAW (GLOVE) IMPLANT
GLOVE SURG SS PI 7.5 STRL IVOR (GLOVE) ×9 IMPLANT
GOWN STRL REUS W/ TWL LRG LVL3 (GOWN DISPOSABLE) ×6 IMPLANT
GOWN STRL REUS W/ TWL XL LVL3 (GOWN DISPOSABLE) ×3 IMPLANT
GOWN STRL REUS W/TWL LRG LVL3 (GOWN DISPOSABLE) ×9
GOWN STRL REUS W/TWL XL LVL3 (GOWN DISPOSABLE) ×3
GRAFT BALLN CATH 65CM (STENTS) ×3 IMPLANT
HEMOSTAT SNOW SURGICEL 2X4 (HEMOSTASIS) IMPLANT
KIT BASIN OR (CUSTOM PROCEDURE TRAY) ×3 IMPLANT
KIT TURNOVER KIT B (KITS) ×3 IMPLANT
LOOP VESSEL MAXI BLUE (MISCELLANEOUS) IMPLANT
LOOP VESSEL MAXI BLUE 1X16 (MISCELLANEOUS) IMPLANT
LOOP VESSEL MINI RED (MISCELLANEOUS) IMPLANT
NDL PERC 18GX7CM (NEEDLE) IMPLANT
NEEDLE PERC 18GX7CM (NEEDLE) IMPLANT
NS IRRIG 1000ML POUR BTL (IV SOLUTION) ×3 IMPLANT
PACK ENDOVASCULAR (PACKS) ×3 IMPLANT
PACK UNIVERSAL I (CUSTOM PROCEDURE TRAY) IMPLANT
PAD ARMBOARD 7.5X6 YLW CONV (MISCELLANEOUS) ×6 IMPLANT
PATCH VASC XENOSURE 1CMX6CM (Vascular Products) ×6 IMPLANT
PATCH VASC XENOSURE 1X6 (Vascular Products) IMPLANT
PENCIL BUTTON HOLSTER BLD 10FT (ELECTRODE) IMPLANT
SET MICROPUNCTURE 5F STIFF (MISCELLANEOUS) ×3 IMPLANT
SHEATH AVANTI 11CM 8FR (SHEATH) ×3 IMPLANT
SHEATH DRYSEAL FLEX 24FR 33CM (SHEATH) IMPLANT
SHEATH PROBE COVER 6X72 (BAG) IMPLANT
SHIELD RADPAD SCOOP 12X17 (MISCELLANEOUS) ×6 IMPLANT
SLEEVE ISOL F/PACE RF HD COVER (MISCELLANEOUS) IMPLANT
SPONGE T-LAP 18X18 ~~LOC~~+RFID (SPONGE) IMPLANT
STENT GRAFT BALLN CATH 65CM (STENTS) ×6
STENT GRFT THORAC ACS 37X37X15 (Endovascular Graft) IMPLANT
STENT GRFT THORAC ACS 45X45X20 (Endovascular Graft) IMPLANT
STOPCOCK 4 WAY LG BORE MALE ST (IV SETS) IMPLANT
STOPCOCK MORSE 400PSI 3WAY (MISCELLANEOUS) ×3 IMPLANT
SURGIFLO W/THROMBIN 8M KIT (HEMOSTASIS) IMPLANT
SUT ETHILON 3 0 PS 1 (SUTURE) IMPLANT
SUT PROLENE 5 0 C 1 24 (SUTURE) IMPLANT
SUT PROLENE 6 0 BV (SUTURE) IMPLANT
SUT PROLENE 7 0 BV 1 (SUTURE) IMPLANT
SUT PROLENE 7 0 BV1 MDA (SUTURE) IMPLANT
SUT SILK 2 0 (SUTURE) ×3
SUT SILK 2-0 18XBRD TIE 12 (SUTURE) IMPLANT
SUT SILK 3 0 (SUTURE) ×3
SUT SILK 3-0 18XBRD TIE 12 (SUTURE) IMPLANT
SUT SILK 4 0 (SUTURE) ×3
SUT SILK 4-0 18XBRD TIE 12 (SUTURE) IMPLANT
SUT VIC AB 2-0 CT1 27 (SUTURE)
SUT VIC AB 2-0 CT1 TAPERPNT 27 (SUTURE) IMPLANT
SUT VIC AB 3-0 SH 27 (SUTURE) ×3
SUT VIC AB 3-0 SH 27X BRD (SUTURE) IMPLANT
SUT VICRYL 4-0 PS2 18IN ABS (SUTURE) IMPLANT
SYR 30ML LL (SYRINGE) IMPLANT
SYR 3ML LL SCALE MARK (SYRINGE) IMPLANT
SYR 50ML LL SCALE MARK (SYRINGE) ×3 IMPLANT
SYR BULB IRRIG 60ML STRL (SYRINGE) IMPLANT
TOWEL GREEN STERILE (TOWEL DISPOSABLE) ×3 IMPLANT
TRAY FOLEY MTR SLVR 16FR STAT (SET/KITS/TRAYS/PACK) ×3 IMPLANT
TUBING CIL FLEX 10 FLL-RA (TUBING) IMPLANT
TUBING HIGH PRESSURE 120CM (CONNECTOR) ×3 IMPLANT
WIRE BENTSON .035X145CM (WIRE) IMPLANT
WIRE HI TORQ VERSACORE J 260CM (WIRE) IMPLANT
WIRE STIFF LUNDERQUIST 260CM (WIRE) ×3 IMPLANT

## 2022-01-15 NOTE — Progress Notes (Addendum)
[facility] [place]  Subjective  - Awake and speech is clear   Objective 127/69 60 98 F (36.7 C) (Oral) 18 99%  Intake/Output Summary (Last 24 hours) at 01/15/2022 1611 Last data filed at 01/15/2022 1315 Gross per 24 hour  Intake 2070 ml  Output 450 ml  Net 1620 ml    Palpable PT B LE Abdomin soft Left groin soft without hematoma Lungs non labored breathing  Assessment/Planning: Post op day 0 TVAR with left groin incision and femoral enterectomy  Stable disposition IV fluids 75 cc/hr Will check labs in am Stable post op disposition with palpable pedal pulses B   [name] 01/15/2022 4:11 PM --  Laboratory Lab Results: Recent Labs    01/15/22 1300  WBC 13.6*  HGB 8.8*  HCT 28.3*  PLT 182   BMET Recent Labs    01/15/22 0737 01/15/22 1300  NA 140 137  K 5.0 5.2*  CL 112* 114*  CO2 19* 16*  GLUCOSE 92 162*  BUN 30* 29*  CREATININE 2.56* 2.34*  CALCIUM 9.3 8.2*    COAG Lab Results  Component Value Date   INR 1.1 01/15/2022   INR 1.0 01/15/2022   INR 1.2 06/01/2021   No results found for: "PTT"  I agree with the above.  The patient is doing very well up on the floor.  She has palpable posterior tibial pulses.  There is no evidence of compartment syndrome.  She has normal motor function of both legs.  She will get hydrated overnight and we will check renal function in the morning.  Annamarie Major

## 2022-01-15 NOTE — Anesthesia Procedure Notes (Signed)
Procedure Name: Intubation Date/Time: 01/15/2022 8:47 AM  Performed by: Darletta Moll, CRNAPre-anesthesia Checklist: Patient identified, Emergency Drugs available, Suction available and Patient being monitored Patient Re-evaluated:Patient Re-evaluated prior to induction Oxygen Delivery Method: Circle system utilized Preoxygenation: Pre-oxygenation with 100% oxygen Induction Type: IV induction Ventilation: Mask ventilation without difficulty Laryngoscope Size: Mac and 3 Grade View: Grade I Tube type: Oral Tube size: 7.0 mm Number of attempts: 1 Airway Equipment and Method: Stylet Placement Confirmation: ETT inserted through vocal cords under direct vision, positive ETCO2 and breath sounds checked- equal and bilateral Secured at: 21 cm Tube secured with: Tape Dental Injury: Teeth and Oropharynx as per pre-operative assessment

## 2022-01-15 NOTE — Op Note (Addendum)
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  Upon sheath removal, we did not have good flow to the leg and so exploration was necessary.  The intima was heaped up around the Pro-glide which required endarterectomy and patch.  I also performed thrombectomy and evacuated thrombus from the popliteal artery.  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  She has developed aneurysmal changes in her descending thoracic aorta.  Maximum diameter 6 cm.  We discussed proceeding with endovascular repair.  A total of 48 cc of contrast was utilized.  Devices: Proximal is a Arboriculturist.  Followed by a 45 x 20 x 2.  Procedure:  The patient was identified in the holding area and taken to [place] [facility]  The patient was then placed supine on the table. general anesthesia was administered.  The patient was prepped and draped in the usual sterile fashion.  A time out was called and antibiotics were administered.  A PA was necessary to explant procedure and assist with technical details.  She helped with wire exchanges, device deployment,  retraction, suctioning, and suture following for the patch angioplasty.  Ultrasound was used to evaluate the left common femoral artery which was widely patent.  A #11 blade was used to make a skin nick.  The left common femoral artery was cannulated under ultrasound guidance with a micropuncture needle.  A Obinna wire was advanced followed placement of micropuncture sheath.  A Bentson wire was then inserted.  The subtendinous tract was dilated with 8 [place] dilators and Pro-glide devices were deployed at the 11:00 and 1 o'clock position.  An 8 French sheath was placed.  Next, a pigtail catheter was advanced into the ascending aorta and a Lunderquist double curve wire was placed.  I then inserted a 24 French sheath with minimal resistance into the aorta.  The patient was then fully heparinized.  I then inserted the IVUS catheter and performed intravascular ultrasound of the abdominal aorta, descending thoracic aorta and aortic arch.  I was able to visualize the origins of the bypass grafts which were marked with rings so they were visible under fluoroscopy.  Next the main body was prepared on the back table.  This was a Gore 37 x 15 device.  It was advanced over the wire into the aortic arch.  A second access in the sheath was obtained and a pigtail catheter was advanced into the acing aorta.  An aortic arch angiogram was performed which confirmed the location of the arch reconstruction.  We then deployed the proximal piece within the surgical graft distal to the left subclavian bypass.  I then deployed a 40 x 20 device with approximately 5 cm overlap.  A third device was necessary.  This was a 40 x 20 device.  Before deploying this, an abdominal aortogram was performed which located the celiac artery.  The distal piece was deployed landing a few centimeters above the celiac artery.  Completion imaging was then performed which showed preservation of the arch vessels and exclusion of the aneurysm with preserved  patency of the celiac and visceral vessels.  The Lunderquist wire was removed and exchanged out for a versa core wire.  I gently removed the 24 French sheath until it was at the level of the femoral head.  I then performed a retrograde injection which showed no injury to the iliac vessels.  The sheath was then removed and the Pro-glide devices were used to close the arteriotomy.  I evaluated signals in the feet.  I was not satisfied with the signals in the left foot and felt that exploration of the groin was necessary.  I extended the skin nick obliquely in the left groin.  Sharp and cautery dissection were used to dissect out the common femoral artery which was exposed proximally and distally to the Pro-glide's.  There was a pulse above but not below.  I then occluded the artery with clamps and removed the Pro-glide suture.  There was heaped up intima at this level.  I performed a limited endarterectomy.  There appeared to be adequate backbleeding and good inflow and so I closed this transversely.  I then evaluated the feet and again was not satisfied.  Therefore I elected to reopen the artery this time extending the arteriotomy longitudinally.  I performed a endarterectomy as there was a lot of soft intima that I felt was contributing to the problem and so I performed a more extensive endarterectomy and tacked down the intima distally.  A bovine patch was then placed and sutured with a 5-0 Prolene.  Once this was completed, I again did not have signals that I was satisfied in the foot.  At this point, I performed angiography of the left leg that showed a popliteal filling defect.  I tried to advance a Fogarty catheter down the superficial femoral artery however I could not get it to go past a few centimeters and I was concerned that it was going behind the tacked up into month.  I then got further exposure of the superficial femoral artery as well as the profundofemoral artery and remove the patch.  I performed  additional distal endarterectomy.  I then passed a #4 Fogarty catheter down the leg and remove thrombus from the popliteal artery.  A second patch was then placed with 5-0 Prolene.  Once blood flow was reestablished to the leg, I now had a very brisk posterior tibial Doppler signal.  At this point, the heparin was reversed with 50 mg of protamine.  Once hemostasis was satisfactory, the femoral sheath was reapproximated with 2-0 Vicryl.  The subcutaneous tissue was closed with 3-0 Vicryl and subcuticular closure of the skin was performed.  Dermabond was applied.  The patient was then successfully extubated.  She was found to be moving all 4 extremities to command.  She was taken to recovery in stable condition.   Disposition: To PACU stable.   Theotis Burrow, M.D., [facility] [place] Office: [phone] Pager:  [phone] TAAA

## 2022-01-15 NOTE — Anesthesia Procedure Notes (Signed)
Arterial Line Insertion Start/End10/02/2022 7:30 AM Performed by: Wilburn Cornelia, CRNA, CRNA  Patient location: Pre-op. Preanesthetic checklist: patient identified, IV checked, site marked, risks and benefits discussed, surgical consent, monitors and equipment checked, pre-op evaluation, timeout performed and anesthesia consent Lidocaine 1% used for infiltration Left, radial was placed Catheter size: 20 G Hand hygiene performed  and maximum sterile barriers used   Attempts: 1 Procedure performed without using ultrasound guided technique. Following insertion, dressing applied and Biopatch. Post procedure assessment: normal and unchanged

## 2022-01-15 NOTE — Interval H&P Note (Signed)
History and Physical Interval Note:  01/15/2022 8:05 AM  [name]  has presented today for surgery, with the diagnosis of Thoracic aortic aneurysm without rupture, unspecified part; Infrarenal abdominal aortic aneurysm without rupture.  The various methods of treatment have been discussed with the patient and family. After consideration of risks, benefits and other options for treatment, the patient has consented to  Procedure(s): THORACIC AORTIC ENDOVASCULAR STENT GRAFT (N/A) as a surgical intervention.  The patient's history has been reviewed, patient examined, no change in status, stable for surgery.  I have reviewed the patient's chart and labs.  Questions were answered to the patient's satisfaction.     Dorothy Harris

## 2022-01-15 NOTE — Transfer of Care (Signed)
Immediate Anesthesia Transfer of Care Note  Patient: Dorothy Harris  Procedure(s) Performed: THORACIC AORTIC ENDOVASCULAR STENT GRAFT ULTRASOUND GUIDANCE FOR VASCULAR ACCESS (Left: Groin) INTRAVASCULAR ULTRASOUND (Left: Groin) LEFT FEMORAL ENDARTERECTOMY (Left: Groin) LEFT FEMORAL PATCH ANGIOPLASTY USING XENOSURE PATCH 1cm x 6cm (Left: Groin) LEFT LOWER EXTREMITY ANGIOGRAM (Left: Leg Lower)  Patient Location: PACU  Anesthesia Type:General  Level of Consciousness: drowsy and patient cooperative  Airway & Oxygen Therapy: Patient Spontanous Breathing and Patient connected to nasal cannula oxygen  Post-op Assessment: Report given to RN, Post -op Vital signs reviewed and stable and Patient moving all extremities X 4  Post vital signs: Reviewed and stable  Last Vitals:  Vitals Value Taken Time  BP 121/69 01/15/22 1345  Temp    Pulse 61 01/15/22 1348  Resp 14 01/15/22 1348  SpO2 97 % 01/15/22 1348  Vitals shown include unvalidated device data.  Last Pain:  Vitals:   01/15/22 0735  PainSc: 0-No pain      Patients Stated Pain Goal: 3 (32/02/33 4356)  Complications: No notable events documented.

## 2022-01-15 NOTE — Anesthesia Postprocedure Evaluation (Signed)
Anesthesia Post Note  Patient: Dorothy Harris  Procedure(s) Performed: THORACIC AORTIC ENDOVASCULAR STENT GRAFT ULTRASOUND GUIDANCE FOR VASCULAR ACCESS (Left: Groin) INTRAVASCULAR ULTRASOUND (Left: Groin) LEFT FEMORAL ENDARTERECTOMY (Left: Groin) LEFT FEMORAL PATCH ANGIOPLASTY USING XENOSURE PATCH 1cm x 6cm (Left: Groin) LEFT LOWER EXTREMITY ANGIOGRAM (Left: Leg Lower)     Patient location during evaluation: PACU Anesthesia Type: General Level of consciousness: awake and alert Pain management: pain level controlled Vital Signs Assessment: post-procedure vital signs reviewed and stable Respiratory status: spontaneous breathing, nonlabored ventilation, respiratory function stable and patient connected to nasal cannula oxygen Cardiovascular status: blood pressure returned to baseline and stable Postop Assessment: no apparent nausea or vomiting Anesthetic complications: no  No notable events documented.  Last Vitals:  Vitals:   01/15/22 1415 01/15/22 1430  BP: 117/67 125/68  Pulse: (!) 59 61  Resp: 15 14  Temp:  37.1 C  SpO2: 95% 95%    Last Pain:  Vitals:   01/15/22 1415  PainSc: Asleep                 Sonam Wandel,W. EDMOND

## 2022-01-16 LAB — POCT ACTIVATED CLOTTING TIME
Activated Clotting Time: 269 seconds
Activated Clotting Time: 185 seconds
Activated Clotting Time: 197 seconds
Activated Clotting Time: 173 seconds
Activated Clotting Time: 203 seconds
Activated Clotting Time: 209 seconds

## 2022-01-16 LAB — CBC
HCT: 23.5 % — ABNORMAL LOW (ref 36.0–46.0)
Hemoglobin: 7.4 g/dL — ABNORMAL LOW (ref 12.0–15.0)
MCH: 33.5 pg (ref 26.0–34.0)
MCHC: 31.5 g/dL (ref 30.0–36.0)
MCV: 106.3 fL — ABNORMAL HIGH (ref 80.0–100.0)
Platelets: 149 10*3/uL — ABNORMAL LOW (ref 150–400)
RBC: 2.21 MIL/uL — ABNORMAL LOW (ref 3.87–5.11)
RDW: 16.9 % — ABNORMAL HIGH (ref 11.5–15.5)
WBC: 18.4 10*3/uL — ABNORMAL HIGH (ref 4.0–10.5)
nRBC: 0.1 % (ref 0.0–0.2)

## 2022-01-16 LAB — BASIC METABOLIC PANEL
Anion gap: 10 (ref 5–15)
BUN: 28 mg/dL — ABNORMAL HIGH (ref 6–20)
CO2: 17 mmol/L — ABNORMAL LOW (ref 22–32)
Calcium: 8.7 mg/dL — ABNORMAL LOW (ref 8.9–10.3)
Chloride: 113 mmol/L — ABNORMAL HIGH (ref 98–111)
Creatinine, Ser: 2.44 mg/dL — ABNORMAL HIGH (ref 0.44–1.00)
GFR, Estimated: 22 mL/min — ABNORMAL LOW (ref >60)
Glucose, Bld: 129 mg/dL — ABNORMAL HIGH (ref 70–99)
Potassium: 5.1 mmol/L (ref 3.5–5.1)
Sodium: 140 mmol/L (ref 135–145)

## 2022-01-16 MED ORDER — OXYCODONE HCL 5 MG PO TABS: 5.0000 mg | ORAL_TABLET | Freq: Four times a day (QID) | ORAL | 0 refills | Status: DC | PRN

## 2022-01-16 MED ORDER — CHLORHEXIDINE GLUCONATE CLOTH 2 % EX PADS
6.0000 | MEDICATED_PAD | Freq: Every day | CUTANEOUS | Status: DC
  Administered 2022-01-16: 6 via TOPICAL

## 2022-01-16 MED ORDER — MUPIROCIN 2 % EX OINT
1.0000 | TOPICAL_OINTMENT | Freq: Two times a day (BID) | CUTANEOUS | Status: DC
  Administered 2022-01-16: 1 via NASAL
  Filled 2022-01-16: qty 22

## 2022-01-16 NOTE — Discharge Instructions (Signed)
   [facility]   Discharge Instructions  Endovascular Aortic Aneurysm Repair  Please refer to the following instructions for your post-procedure care. Your surgeon or Physician Assistant will discuss any changes with you.  Activity  You are encouraged to walk as much as you can. You can slowly return to normal activities but must avoid strenuous activity and heavy lifting until your doctor tells you it's OK. Avoid activities such as vacuuming or swinging a gold club. It is normal to feel tired for several weeks after your surgery. Do not drive until your doctor gives the OK and you are no longer taking prescription pain medications. It is also normal to have difficulty with sleep habits, eating, and bowel movements after surgery. These will go away with time.  Bathing/Showering  You may shower after you go home. If you have an incision, do not soak in a bathtub, hot tub, or swim until the incision heals completely.  Incision Care  Shower every day. Clean your incision with mild soap and water. Pat the area dry with a clean towel. You do not need a bandage unless otherwise instructed. Do not apply any ointments or creams to your incision. If you clothing is irritating, you may cover your incision with a dry gauze pad.  Diet  Resume your normal diet. There are no special food restrictions following this procedure. A low fat/low cholesterol diet is recommended for all patients with vascular disease. In order to heal from your surgery, it is CRITICAL to get adequate nutrition. Your body requires vitamins, minerals, and protein. Vegetables are the best source of vitamins and minerals. Vegetables also provide the perfect balance of protein. Processed food has little nutritional value, so try to avoid this.  Medications  Resume taking all of your medications unless your doctor or nurse practitioner tells you not to. If your incision is causing pain, you may take  over-the-counter pain relievers such as acetaminophen (Tylenol). If you were prescribed a stronger pain medication, please be aware these medications can cause nausea and constipation. Prevent nausea by taking the medication with a snack or meal. Avoid constipation by drinking plenty of fluids and eating foods with a high amount of fiber, such as fruits, vegetables, and grains. Do not take Tylenol if you are taking prescription pain medications.   Follow up  [facility] office will schedule a follow-up appointment with a C.T. scan 3-4 weeks after your surgery.  Please call us immediately for any of the following conditions  Severe or worsening pain in your legs or feet or in your abdomen back or chest. Increased pain, redness, drainage (pus) from your incision sit. Increased abdominal pain, bloating, nausea, vomiting or persistent diarrhea. Fever of 101 degrees or higher. Swelling in your leg (s),  Reduce your risk of vascular disease  Stop smoking. If you would like help call [facility] at 1-800-QUIT-NOW [phone]) or [place] at [phone]. Manage your cholesterol Maintain a desired weight Control your diabetes Keep your blood pressure down  If you have questions, please call the office at [phone].

## 2022-01-16 NOTE — Progress Notes (Signed)
Mobility Specialist Progress Note:   01/16/22 1000  Mobility  Activity Ambulated with assistance to bathroom  Level of Assistance Contact guard assist, steadying assist  Assistive Device Front wheel walker  Distance Ambulated (ft) 12 ft  Activity Response Tolerated well  Mobility Referral Yes  $Mobility charge 1 Mobility   Pt received in bed asking to go to bathroom. Complaints of pain when moving. Left in bathroom with NT present.   [facility] Surveyor, mining Chat only

## 2022-01-16 NOTE — Progress Notes (Signed)
Pt ambulated 200 ft in hallway with minimal assistance. Tolerated well. Returned back to bed with no issues.

## 2022-01-16 NOTE — TOC Initial Note (Signed)
Transition of Care [facility]) - Initial/Assessment Note    Patient Details  Name: Dorothy Harris MRN: [id number] Date of Birth: 1960-11-07  Transition of Care [facility]) CM/SW Contact:    Verdell Carmine, RN Phone Number: 01/16/2022, 5:10 PM  Clinical Narrative:                    Transitions of care screening done, no needs identified at this time. Please place a TOC consult if needs arisee. The patient will be followed in  progression rounds     Patient Goals and CMS Choice        Expected Discharge Plan and Services           Expected Discharge Date: 01/16/22                                    Prior Living Arrangements/Services                       Activities of Daily Living Home Assistive Devices/Equipment: Ostomy supplies, Environmental consultant (specify type), Eyeglasses, Cane (specify quad or straight) ADL Screening (condition at time of admission) Patient's cognitive ability adequate to safely complete daily activities?: Yes Patient able to express need for assistance with ADLs?: Yes Independently performs ADLs?: Yes (appropriate for developmental age) Does the patient have difficulty walking or climbing stairs?: No Weakness of Legs: None Weakness of Arms/Hands: None  Permission Sought/Granted                  Emotional Assessment              Admission diagnosis:  Thoracic aortic aneurysm ([place]) [I71.20] Patient Active Problem List   Diagnosis Date Noted   Thoracic aortic aneurysm ([place]) 01/15/2022   Annual physical exam 11/05/2021   Depression, major, single episode, complete remission ([place]) 06/21/2021   S/P exploratory laparotomy 06/21/2021   Mass of right breast 06/21/2021   Disability examination 06/21/2021   Pressure injury of skin 06/02/2021   Hyperkalemia 06/01/2021   Acute hyperkalemia 06/01/2021   PAF (paroxysmal atrial fibrillation) ([place]) 05/07/2021   Essential hypertension 05/07/2021   Pure hypercholesterolemia 05/07/2021    Debility 04/15/2021   Acute hypoxemic respiratory failure ([place]) 04/07/2021   Malnutrition of moderate degree 04/03/2021   Encounter for postanesthesia care 04/02/2021   Peritonitis ([place]) 04/02/2021   Endotracheally intubated 04/02/2021   AKI (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 04/02/2021   S/P ascending aortic replacement 03/25/2021   Encounter for weaning from ventilator [facility])    Status post surgery 03/24/2021   Tobacco abuse 07/30/2017   History of drug use 07/30/2017   Influenza, pneumonia    Aortic dissection distal to left subclavian ([place]) 05/31/2017   Descending thoracic dissection ([place]) 05/31/2017   Chest pain 05/31/2017   Coronary artery disease due to lipid rich plaque    Acute on chronic diastolic CHF (congestive heart failure) ([place])    Acute coronary syndrome ([place]) 12/23/2015   NSTEMI (non-ST elevated myocardial infarction) ([place]) 12/22/2015   PCP:  Renee Rival, FNP Pharmacy:   [place] [place] [address] [place] [address] Phone: [phone] Fax: [phone]     Social Determinants of Health (SDOH) Interventions    Readmission Risk Interventions    06/06/2021    1:23 PM 04/15/2021    1:58 PM  Readmission Risk Prevention  Plan  Transportation Screening Complete   Home Care Screening  Complete  Medication Review (RN CM)  Complete  Medication Review ([place]) Complete   PCP or Specialist appointment within 3-5 days of discharge Complete   HRI or Home Care Consult Complete   [place] Not Applicable

## 2022-01-16 NOTE — Progress Notes (Addendum)
  Progress Note    01/16/2022 7:36 AM 1 Day Post-Op  Subjective:  Soreness left groin.  Has not yet been out of bed.   Vitals:   01/15/22 2310 01/16/22 0310  BP: 127/71 119/65  Pulse: 66 63  Resp: 13 12  Temp: 98 F (36.7 C) 98.2 F (36.8 C)  SpO2: 99% 99%   Physical Exam: Lungs:  non labored Incisions:  L groin incision c/d/i Extremities:  palpable L PT pulse; palpable R DP pulse Neurologic: A&O  CBC    Component Value Date/Time   WBC 18.4 (H) 01/16/2022 0408   RBC 2.21 (L) 01/16/2022 0408   HGB 7.4 (L) 01/16/2022 0408   HCT 23.5 (L) 01/16/2022 0408   PLT 149 (L) 01/16/2022 0408   MCV 106.3 (H) 01/16/2022 0408   MCH 33.5 01/16/2022 0408   MCHC 31.5 01/16/2022 0408   RDW 16.9 (H) 01/16/2022 0408   LYMPHSABS 1.4 04/16/2021 0524   MONOABS 1.0 04/16/2021 0524   EOSABS 0.3 04/16/2021 0524   BASOSABS 0.1 04/16/2021 0524    BMET    Component Value Date/Time   NA 140 01/16/2022 0408   NA 139 12/23/2021 0000   K 5.1 01/16/2022 0408   CL 113 (H) 01/16/2022 0408   CO2 17 (L) 01/16/2022 0408   GLUCOSE 129 (H) 01/16/2022 0408   BUN 28 (H) 01/16/2022 0408   BUN 24 (A) 12/23/2021 0000   CREATININE 2.44 (H) 01/16/2022 0408   CREATININE 1.69 (H) 08/21/2017 0935   CALCIUM 8.7 (L) 01/16/2022 0408   GFRNONAA 22 (L) 01/16/2022 0408   GFRNONAA 33 (L) 08/21/2017 0935   GFRAA 53 (L) 10/07/2017 0720   GFRAA 39 (L) 08/21/2017 0935    INR    Component Value Date/Time   INR 1.1 01/15/2022 1300     Intake/Output Summary (Last 24 hours) at 01/16/2022 0736 Last data filed at 01/16/2022 0600 Gross per 24 hour  Intake 3322.64 ml  Output 2050 ml  Net 1272.64 ml     Assessment/Plan:  61 y.o. female is s/p TEVAR with LLE thrombectomy  1 Day Post-Op   BLE well perfused; moving all extremities well L groin c/d/I Renal: labs stable, foley already removed, monitor urine output OOB with therapy today Home when mobility improved and pain controlled   Dagoberto Ligas,  PA-C [facility] [phone] 01/16/2022 7:36 AM  I agree with the above.  I have seen and evaluated the patient.  She is status post endovascular pair of thoracic aneurysm.  She had to undergo femoral endarterectomy and leg thrombectomy.  She is resting comfortably.  She is tolerating her diet and voiding.  She has palpable posterior tibial pulses.  There is no evidence of compartment syndrome or lower extremity weakness.  Her creatinine remained stable at 2.44.  I will see how she does ambulating today.  I anticipate her being able to return home this afternoon  Wells Anum Palecek

## 2022-01-16 NOTE — Progress Notes (Signed)
    To whom it may concern,  Please excuse Ms. Mariza Bourget from work for 2 weeks until 01/30/2022 due to her recent hospitalization and surgery.  Thank you,  Dagoberto Ligas, PA-C [facility] [phone] 01/16/2022  3:58 PM

## 2022-01-16 NOTE — Plan of Care (Signed)
  Problem: Education: Goal: Knowledge of discharge needs will improve Outcome: Progressing   Problem: Clinical Measurements: Goal: Postoperative complications will be avoided or minimized Outcome: Progressing   Problem: Respiratory: Goal: Ability to achieve and maintain a regular respiratory rate will improve Outcome: Progressing   Problem: Skin Integrity: Goal: Demonstration of wound healing without infection will improve Outcome: Progressing

## 2022-01-17 ENCOUNTER — Inpatient Hospital Stay (HOSPITAL_COMMUNITY): Payer: PRIVATE HEALTH INSURANCE

## 2022-01-17 ENCOUNTER — Emergency Department (HOSPITAL_COMMUNITY): Payer: PRIVATE HEALTH INSURANCE

## 2022-01-17 ENCOUNTER — Inpatient Hospital Stay (HOSPITAL_COMMUNITY)
Admission: EM | Admit: 2022-01-17 | Discharge: 2022-01-29 | DRG: 091 | Disposition: A | Discharge status: Rehabilitation Facility | Payer: PRIVATE HEALTH INSURANCE | Attending: Surgery | Admitting: Surgery

## 2022-01-17 ENCOUNTER — Inpatient Hospital Stay (HOSPITAL_COMMUNITY): Payer: PRIVATE HEALTH INSURANCE | Admitting: Anesthesiology

## 2022-01-17 ENCOUNTER — Other Ambulatory Visit: Payer: Self-pay

## 2022-01-17 ENCOUNTER — Telehealth: Payer: Self-pay

## 2022-01-17 ENCOUNTER — Other Ambulatory Visit: Payer: Self-pay | Admitting: Physician Assistant

## 2022-01-17 DIAGNOSIS — F17210 Nicotine dependence, cigarettes, uncomplicated: Secondary | ICD-10-CM | POA: Diagnosis present

## 2022-01-17 DIAGNOSIS — Z9049 Acquired absence of other specified parts of digestive tract: Secondary | ICD-10-CM | POA: Diagnosis not present

## 2022-01-17 DIAGNOSIS — G9511 Acute infarction of spinal cord (embolic) (nonembolic): Secondary | ICD-10-CM | POA: Diagnosis present

## 2022-01-17 DIAGNOSIS — Z1152 Encounter for screening for COVID-19: Secondary | ICD-10-CM | POA: Diagnosis not present

## 2022-01-17 DIAGNOSIS — Z841 Family history of disorders of kidney and ureter: Secondary | ICD-10-CM | POA: Diagnosis not present

## 2022-01-17 DIAGNOSIS — Z79899 Other long term (current) drug therapy: Secondary | ICD-10-CM

## 2022-01-17 DIAGNOSIS — K219 Gastro-esophageal reflux disease without esophagitis: Secondary | ICD-10-CM | POA: Diagnosis present

## 2022-01-17 DIAGNOSIS — Z932 Ileostomy status: Secondary | ICD-10-CM | POA: Diagnosis not present

## 2022-01-17 DIAGNOSIS — R29898 Other symptoms and signs involving the musculoskeletal system: Secondary | ICD-10-CM | POA: Diagnosis present

## 2022-01-17 DIAGNOSIS — R509 Fever, unspecified: Secondary | ICD-10-CM | POA: Diagnosis not present

## 2022-01-17 DIAGNOSIS — N183 Chronic kidney disease, stage 3 unspecified: Secondary | ICD-10-CM

## 2022-01-17 DIAGNOSIS — I712 Thoracic aortic aneurysm, without rupture, unspecified: Secondary | ICD-10-CM | POA: Diagnosis present

## 2022-01-17 DIAGNOSIS — D62 Acute posthemorrhagic anemia: Secondary | ICD-10-CM | POA: Diagnosis present

## 2022-01-17 DIAGNOSIS — R531 Weakness: Secondary | ICD-10-CM | POA: Diagnosis present

## 2022-01-17 DIAGNOSIS — D72829 Elevated white blood cell count, unspecified: Secondary | ICD-10-CM | POA: Diagnosis not present

## 2022-01-17 DIAGNOSIS — N184 Chronic kidney disease, stage 4 (severe): Secondary | ICD-10-CM | POA: Diagnosis not present

## 2022-01-17 DIAGNOSIS — I959 Hypotension, unspecified: Secondary | ICD-10-CM | POA: Diagnosis not present

## 2022-01-17 DIAGNOSIS — G9782 Other postprocedural complications and disorders of nervous system: Secondary | ICD-10-CM | POA: Diagnosis present

## 2022-01-17 DIAGNOSIS — Z8249 Family history of ischemic heart disease and other diseases of the circulatory system: Secondary | ICD-10-CM | POA: Diagnosis not present

## 2022-01-17 DIAGNOSIS — Z7982 Long term (current) use of aspirin: Secondary | ICD-10-CM

## 2022-01-17 DIAGNOSIS — E7800 Pure hypercholesterolemia, unspecified: Secondary | ICD-10-CM | POA: Diagnosis present

## 2022-01-17 DIAGNOSIS — Z888 Allergy status to other drugs, medicaments and biological substances status: Secondary | ICD-10-CM

## 2022-01-17 DIAGNOSIS — M109 Gout, unspecified: Secondary | ICD-10-CM | POA: Diagnosis present

## 2022-01-17 DIAGNOSIS — G9600 Cerebrospinal fluid leak, unspecified: Secondary | ICD-10-CM | POA: Diagnosis not present

## 2022-01-17 DIAGNOSIS — Y831 Surgical operation with implant of artificial internal device as the cause of abnormal reaction of the patient, or of later complication, without mention of misadventure at the time of the procedure: Secondary | ICD-10-CM | POA: Diagnosis present

## 2022-01-17 DIAGNOSIS — D631 Anemia in chronic kidney disease: Secondary | ICD-10-CM | POA: Diagnosis present

## 2022-01-17 DIAGNOSIS — N1832 Chronic kidney disease, stage 3b: Secondary | ICD-10-CM | POA: Diagnosis present

## 2022-01-17 DIAGNOSIS — I129 Hypertensive chronic kidney disease with stage 1 through stage 4 chronic kidney disease, or unspecified chronic kidney disease: Secondary | ICD-10-CM | POA: Diagnosis present

## 2022-01-17 DIAGNOSIS — I480 Paroxysmal atrial fibrillation: Secondary | ICD-10-CM | POA: Diagnosis present

## 2022-01-17 DIAGNOSIS — D696 Thrombocytopenia, unspecified: Secondary | ICD-10-CM | POA: Diagnosis present

## 2022-01-17 DIAGNOSIS — R739 Hyperglycemia, unspecified: Secondary | ICD-10-CM | POA: Diagnosis not present

## 2022-01-17 LAB — TYPE AND SCREEN
ABO/RH(D): A POS
Antibody Screen: NEGATIVE
Unit division: 0
Unit division: 0

## 2022-01-17 LAB — COMPREHENSIVE METABOLIC PANEL
ALT: 14 U/L (ref 0–44)
AST: 15 U/L (ref 15–41)
Albumin: 3.6 g/dL (ref 3.5–5.0)
Alkaline Phosphatase: 85 U/L (ref 38–126)
Anion gap: 7 (ref 5–15)
BUN: 23 mg/dL — ABNORMAL HIGH (ref 6–20)
CO2: 20 mmol/L — ABNORMAL LOW (ref 22–32)
Calcium: 8.6 mg/dL — ABNORMAL LOW (ref 8.9–10.3)
Chloride: 113 mmol/L — ABNORMAL HIGH (ref 98–111)
Creatinine, Ser: 2.06 mg/dL — ABNORMAL HIGH (ref 0.44–1.00)
GFR, Estimated: 27 mL/min — ABNORMAL LOW (ref >60)
Glucose, Bld: 131 mg/dL — ABNORMAL HIGH (ref 70–99)
Potassium: 4.4 mmol/L (ref 3.5–5.1)
Sodium: 140 mmol/L (ref 135–145)
Total Bilirubin: 0.9 mg/dL (ref 0.3–1.2)
Total Protein: 6.6 g/dL (ref 6.5–8.1)

## 2022-01-17 LAB — CBC
HCT: 22.3 % — ABNORMAL LOW (ref 36.0–46.0)
Hemoglobin: 7 g/dL — ABNORMAL LOW (ref 12.0–15.0)
MCH: 33.5 pg (ref 26.0–34.0)
MCHC: 31.4 g/dL (ref 30.0–36.0)
MCV: 106.7 fL — ABNORMAL HIGH (ref 80.0–100.0)
Platelets: 116 10*3/uL — ABNORMAL LOW (ref 150–400)
RBC: 2.09 MIL/uL — ABNORMAL LOW (ref 3.87–5.11)
RDW: 17.2 % — ABNORMAL HIGH (ref 11.5–15.5)
WBC: 19.1 10*3/uL — ABNORMAL HIGH (ref 4.0–10.5)
nRBC: 0.4 % — ABNORMAL HIGH (ref 0.0–0.2)

## 2022-01-17 LAB — DIFFERENTIAL
Abs Immature Granulocytes: 0.24 10*3/uL — ABNORMAL HIGH (ref 0.00–0.07)
Basophils Absolute: 0 10*3/uL (ref 0.0–0.1)
Basophils Relative: 0 %
Eosinophils Absolute: 0 10*3/uL (ref 0.0–0.5)
Eosinophils Relative: 0 %
Immature Granulocytes: 1 %
Lymphocytes Relative: 8 %
Lymphs Abs: 1.5 10*3/uL (ref 0.7–4.0)
Monocytes Absolute: 2.6 10*3/uL — ABNORMAL HIGH (ref 0.1–1.0)
Monocytes Relative: 14 %
Neutro Abs: 14.7 10*3/uL — ABNORMAL HIGH (ref 1.7–7.7)
Neutrophils Relative %: 77 %

## 2022-01-17 LAB — I-STAT CHEM 8, ED
BUN: 20 mg/dL (ref 6–20)
Calcium, Ion: 1.24 mmol/L (ref 1.15–1.40)
Chloride: 113 mmol/L — ABNORMAL HIGH (ref 98–111)
Creatinine, Ser: 2.2 mg/dL — ABNORMAL HIGH (ref 0.44–1.00)
Glucose, Bld: 128 mg/dL — ABNORMAL HIGH (ref 70–99)
HCT: 22 % — ABNORMAL LOW (ref 36.0–46.0)
Hemoglobin: 7.5 g/dL — ABNORMAL LOW (ref 12.0–15.0)
Potassium: 4.4 mmol/L (ref 3.5–5.1)
Sodium: 140 mmol/L (ref 135–145)
TCO2: 18 mmol/L — ABNORMAL LOW (ref 22–32)

## 2022-01-17 LAB — HEMOGLOBIN A1C
Hgb A1c MFr Bld: 4.9 % (ref 4.8–5.6)
Mean Plasma Glucose: 93.93 mg/dL

## 2022-01-17 LAB — BPAM RBC
Blood Product Expiration Date: 202311042359
Blood Product Expiration Date: 202311042359
ISSUE DATE / TIME: 202310111525
Unit Type and Rh: 6200
Unit Type and Rh: 6200

## 2022-01-17 LAB — GLUCOSE, CAPILLARY
Glucose-Capillary: 112 mg/dL — ABNORMAL HIGH (ref 70–99)
Glucose-Capillary: 172 mg/dL — ABNORMAL HIGH (ref 70–99)

## 2022-01-17 LAB — APTT: aPTT: 36 seconds (ref 24–36)

## 2022-01-17 LAB — PREPARE RBC (CROSSMATCH)

## 2022-01-17 LAB — PROTIME-INR
INR: 1.2 (ref 0.8–1.2)
Prothrombin Time: 14.6 seconds (ref 11.4–15.2)

## 2022-01-17 LAB — CBG MONITORING, ED: Glucose-Capillary: 134 mg/dL — ABNORMAL HIGH (ref 70–99)

## 2022-01-17 LAB — ETHANOL: Alcohol, Ethyl (B): <10 mg/dL (ref <10)

## 2022-01-17 MED ORDER — NOREPINEPHRINE 4 MG/250ML-% IV SOLN
0.0000 ug/min | INTRAVENOUS | Status: DC
  Administered 2022-01-17: 19 ug/min via INTRAVENOUS
  Administered 2022-01-18: 12 ug/min via INTRAVENOUS
  Administered 2022-01-18: 3 ug/min via INTRAVENOUS
  Administered 2022-01-19: 10 ug/min via INTRAVENOUS
  Administered 2022-01-19: 18 ug/min via INTRAVENOUS
  Administered 2022-01-19: 17 ug/min via INTRAVENOUS
  Administered 2022-01-19 (×2): 12 ug/min via INTRAVENOUS
  Administered 2022-01-19: 19 ug/min via INTRAVENOUS
  Administered 2022-01-20 (×3): 14 ug/min via INTRAVENOUS
  Administered 2022-01-20 (×2): 18 ug/min via INTRAVENOUS
  Administered 2022-01-20 – 2022-01-21 (×2): 14 ug/min via INTRAVENOUS
  Filled 2022-01-17 (×4): qty 250
  Filled 2022-01-17: qty 500
  Filled 2022-01-17 (×9): qty 250

## 2022-01-17 MED ORDER — CHLORHEXIDINE GLUCONATE CLOTH 2 % EX PADS
6.0000 | MEDICATED_PAD | Freq: Every day | CUTANEOUS | Status: DC
  Administered 2022-01-17 – 2022-01-29 (×13): 6 via TOPICAL

## 2022-01-17 MED ORDER — NOREPINEPHRINE 4 MG/250ML-% IV SOLN
2.0000 ug/min | INTRAVENOUS | Status: DC
  Administered 2022-01-17: 2 ug/min via INTRAVENOUS
  Filled 2022-01-17: qty 250

## 2022-01-17 MED ORDER — ALUM & MAG HYDROXIDE-SIMETH 200-200-20 MG/5ML PO SUSP
15.0000 mL | ORAL | Status: DC | PRN
  Filled 2022-01-17: qty 30

## 2022-01-17 MED ORDER — NOREPINEPHRINE 4 MG/250ML-% IV SOLN: 0.0000 ug/min | INTRAVENOUS | Status: DC

## 2022-01-17 MED ORDER — ONDANSETRON HCL 4 MG/2ML IJ SOLN
4.0000 mg | Freq: Four times a day (QID) | INTRAMUSCULAR | Status: DC | PRN
  Administered 2022-01-19 – 2022-01-28 (×3): 4 mg via INTRAVENOUS
  Filled 2022-01-17 (×3): qty 2

## 2022-01-17 MED ORDER — NOREPINEPHRINE 4 MG/250ML-% IV SOLN
INTRAVENOUS | Status: AC
  Filled 2022-01-17: qty 250

## 2022-01-17 MED ORDER — VANCOMYCIN HCL 1500 MG/300ML IV SOLN
1500.0000 mg | Freq: Once | INTRAVENOUS | Status: AC
  Administered 2022-01-17: 1500 mg via INTRAVENOUS
  Filled 2022-01-17: qty 300

## 2022-01-17 MED ORDER — INSULIN ASPART 100 UNIT/ML IJ SOLN
0.0000 [IU] | Freq: Three times a day (TID) | INTRAMUSCULAR | Status: DC
  Administered 2022-01-19 – 2022-01-22 (×3): 1 [IU] via SUBCUTANEOUS
  Administered 2022-01-22: 2 [IU] via SUBCUTANEOUS
  Administered 2022-01-22 – 2022-01-25 (×4): 1 [IU] via SUBCUTANEOUS

## 2022-01-17 MED ORDER — MAGNESIUM SULFATE 2 GM/50ML IV SOLN: 2.0000 g | Freq: Every day | INTRAVENOUS | Status: DC | PRN

## 2022-01-17 MED ORDER — POTASSIUM CHLORIDE CRYS ER 20 MEQ PO TBCR: 20.0000 meq | EXTENDED_RELEASE_TABLET | Freq: Every day | ORAL | Status: DC | PRN

## 2022-01-17 MED ORDER — METOPROLOL TARTRATE 5 MG/5ML IV SOLN: 2.0000 mg | INTRAVENOUS | Status: DC | PRN

## 2022-01-17 MED ORDER — SODIUM CHLORIDE 0.9 % IV SOLN
2.0000 g | Freq: Two times a day (BID) | INTRAVENOUS | Status: AC
  Administered 2022-01-17 – 2022-01-23 (×12): 2 g via INTRAVENOUS
  Filled 2022-01-17 (×12): qty 12.5

## 2022-01-17 MED ORDER — SODIUM CHLORIDE 0.9% IV SOLUTION: Freq: Once | INTRAVENOUS | Status: DC

## 2022-01-17 MED ORDER — VANCOMYCIN HCL 1250 MG/250ML IV SOLN
1250.0000 mg | INTRAVENOUS | Status: AC
  Administered 2022-01-19 – 2022-01-22 (×3): 1250 mg via INTRAVENOUS
  Filled 2022-01-17 (×3): qty 250

## 2022-01-17 MED ORDER — SODIUM CHLORIDE 0.9 % IV SOLN
100.0000 mL/h | INTRAVENOUS | Status: DC
  Administered 2022-01-17 – 2022-01-22 (×4): 100 mL/h via INTRAVENOUS

## 2022-01-17 MED ORDER — SODIUM CHLORIDE 0.9 % IV SOLN: 500.0000 mL | Freq: Once | INTRAVENOUS | Status: DC | PRN

## 2022-01-17 MED ORDER — GUAIFENESIN-DM 100-10 MG/5ML PO SYRP: 15.0000 mL | ORAL_SOLUTION | ORAL | Status: DC | PRN

## 2022-01-17 MED ORDER — SODIUM CHLORIDE 0.9 % IV BOLUS
500.0000 mL | Freq: Once | INTRAVENOUS | Status: AC
  Administered 2022-01-17: 500 mL via INTRAVENOUS

## 2022-01-17 MED ORDER — SODIUM CHLORIDE 0.9 % IV SOLN: INTRAVENOUS | Status: AC

## 2022-01-17 MED ORDER — DOCUSATE SODIUM 100 MG PO CAPS
100.0000 mg | ORAL_CAPSULE | Freq: Every day | ORAL | Status: DC
  Administered 2022-01-18 – 2022-01-29 (×12): 100 mg via ORAL
  Filled 2022-01-17 (×12): qty 1

## 2022-01-17 MED ORDER — SODIUM CHLORIDE 0.9 % IV SOLN
250.0000 mL | INTRAVENOUS | Status: DC
  Administered 2022-01-18: 500 mL via INTRAVENOUS

## 2022-01-17 MED ORDER — ACETAMINOPHEN 325 MG RE SUPP: 325.0000 mg | RECTAL | Status: DC | PRN

## 2022-01-17 MED ORDER — SODIUM CHLORIDE 0.9% FLUSH
10.0000 mL | Freq: Two times a day (BID) | INTRAVENOUS | Status: DC
  Administered 2022-01-17 – 2022-01-21 (×5): 10 mL
  Administered 2022-01-21: 20 mL
  Administered 2022-01-22 – 2022-01-29 (×13): 10 mL

## 2022-01-17 MED ORDER — MORPHINE SULFATE (PF) 2 MG/ML IV SOLN
2.0000 mg | Freq: Once | INTRAVENOUS | Status: AC
  Administered 2022-01-17: 2 mg via INTRAVENOUS
  Filled 2022-01-17: qty 1

## 2022-01-17 MED ORDER — NOREPINEPHRINE 4 MG/250ML-% IV SOLN: 2.0000 ug/min | INTRAVENOUS | Status: DC

## 2022-01-17 MED ORDER — PANTOPRAZOLE SODIUM 40 MG PO TBEC
40.0000 mg | DELAYED_RELEASE_TABLET | Freq: Every day | ORAL | Status: DC
  Administered 2022-01-17: 40 mg via ORAL
  Filled 2022-01-17: qty 1

## 2022-01-17 MED ORDER — SODIUM CHLORIDE 0.9% FLUSH: 10.0000 mL | INTRAVENOUS | Status: DC | PRN

## 2022-01-17 MED ORDER — OXYCODONE-ACETAMINOPHEN 5-325 MG PO TABS
1.0000 | ORAL_TABLET | ORAL | Status: DC | PRN
  Administered 2022-01-17 – 2022-01-18 (×6): 1 via ORAL
  Administered 2022-01-19: 2 via ORAL
  Administered 2022-01-19: 1 via ORAL
  Administered 2022-01-19 (×2): 2 via ORAL
  Administered 2022-01-19: 1 via ORAL
  Administered 2022-01-20 – 2022-01-28 (×24): 2 via ORAL
  Administered 2022-01-28: 1 via ORAL
  Administered 2022-01-28 – 2022-01-29 (×3): 2 via ORAL
  Filled 2022-01-17 (×7): qty 2
  Filled 2022-01-17: qty 1
  Filled 2022-01-17: qty 2
  Filled 2022-01-17: qty 1
  Filled 2022-01-17 (×7): qty 2
  Filled 2022-01-17: qty 1
  Filled 2022-01-17 (×6): qty 2
  Filled 2022-01-17: qty 1
  Filled 2022-01-17 (×12): qty 2
  Filled 2022-01-17: qty 1

## 2022-01-17 MED ORDER — SODIUM CHLORIDE 0.9 % IV SOLN: 250.0000 mL | INTRAVENOUS | Status: DC

## 2022-01-17 MED ORDER — ACETAMINOPHEN 325 MG PO TABS
325.0000 mg | ORAL_TABLET | ORAL | Status: DC | PRN
  Administered 2022-01-17: 650 mg via ORAL
  Administered 2022-01-18: 325 mg via ORAL
  Administered 2022-01-20 – 2022-01-26 (×4): 650 mg via ORAL
  Filled 2022-01-17: qty 2
  Filled 2022-01-17: qty 1
  Filled 2022-01-17 (×6): qty 2

## 2022-01-17 MED ORDER — PHENOL 1.4 % MT LIQD: 1.0000 | OROMUCOSAL | Status: DC | PRN
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
## 2022-01-17 NOTE — Anesthesia Procedure Notes (Signed)
Arterial Line Insertion Start/End10/13/2023 2:10 PM, 01/17/2022 2:00 PM Performed by: Pervis Hocking, DO, anesthesiologist  Patient location: Pre-op. Preanesthetic checklist: patient identified, IV checked, site marked, risks and benefits discussed, surgical consent, monitors and equipment checked, pre-op evaluation, timeout performed and anesthesia consent Lidocaine 1% used for infiltration Right, radial was placed Catheter size: 20 G Hand hygiene performed  and maximum sterile barriers used   Attempts: 1 Procedure performed without using ultrasound guided technique. Following insertion, dressing applied. Post procedure assessment: normal and unchanged  Patient tolerated the procedure well with no immediate complications.

## 2022-01-17 NOTE — ED Notes (Signed)
Report given to [place], short stay RN

## 2022-01-17 NOTE — ED Provider Notes (Signed)
[facility] EMERGENCY DEPARTMENT Provider Note   CSN: [id number] Arrival date & time: 01/17/22  1055  An emergency department physician performed an initial assessment on this suspected stroke patient at 1057.  History  No chief complaint on file.   Dorothy Harris is a 61 y.o. female.  HPI Patient presents via EMS with concern for lower extremity weakness.  Weakness is right greater than left, with right-sided inability to move at all.  Onset was 7 hours prior to ED arrival, initially reported, but it seems of the patient had difficulty yesterday as well.  History is notable for recent thoracic aortic aneurysm repair.  Patient was designated as code stroke by EMS, and evaluation here was expeditious. Patient denies new pain.    Home Medications Prior to Admission medications   Medication Sig Start Date End Date Taking? Authorizing Provider  acetaminophen (TYLENOL) 325 MG tablet Take 1-2 tablets (325-650 mg total) by mouth every 4 (four) hours as needed for mild pain. 04/25/21   Angiulli, Lavon Paganini, PA-C  amiodarone (PACERONE) 200 MG tablet Take 1 tablet (200 mg total) by mouth daily. 05/27/21   Skeet Latch, MD  amLODipine (NORVASC) 10 MG tablet Take 10 mg by mouth daily. 09/20/21   [provider]  aspirin 81 MG chewable tablet Chew 4 tablets (324 mg total) by mouth daily. 04/25/21   Angiulli, Lavon Paganini, PA-C  atorvastatin (LIPITOR) 80 MG tablet Take 1 tablet (80 mg total) by mouth daily. 05/27/21   Skeet Latch, MD  ferrous sulfate 325 (65 FE) MG tablet Take 1 tablet (325 mg total) by mouth daily with breakfast. 04/25/21   Angiulli, Lavon Paganini, PA-C  metoprolol tartrate (LOPRESSOR) 25 MG tablet Take 1 tablet (25 mg total) by mouth 2 (two) times daily. 05/27/21   Skeet Latch, MD  Multiple Vitamin (MULTIVITAMIN WITH MINERALS) TABS tablet Take 1 tablet by mouth daily. 04/13/21   Florencia Reasons, MD  oxyCODONE (OXY IR/ROXICODONE) 5 MG immediate release tablet Take 1 tablet (5 mg  total) by mouth every 6 (six) hours as needed for moderate pain. 01/16/22   Dagoberto Ligas, PA-C  pantoprazole (PROTONIX) 40 MG tablet Take 1 tablet (40 mg total) by mouth daily. 06/21/21   Paseda, Dewaine Conger, FNP  sodium bicarbonate 650 MG tablet Take 1,300 mg by mouth 2 (two) times daily. 09/20/21   [provider]      Allergies    Chlorthalidone    Review of Systems   Review of Systems  Unable to perform ROS: Acuity of condition    Physical Exam Updated Vital Signs BP 124/63   Pulse 80   Temp 99 F (37.2 C) (Oral)   Resp 19   SpO2 93%  Physical Exam Vitals and nursing note reviewed.  Constitutional:      General: She is not in acute distress.    Appearance: She is well-developed.  HENT:     Head: Normocephalic and atraumatic.  Eyes:     Conjunctiva/sclera: Conjunctivae normal.  Cardiovascular:     Rate and Rhythm: Normal rate and regular rhythm.  Pulmonary:     Effort: Pulmonary effort is normal. No respiratory distress.     Breath sounds: Normal breath sounds. No stridor.  Abdominal:     General: There is no distension.  Skin:    General: Skin is warm and dry.  Neurological:     Mental Status: She is alert and oriented to person, place, and time.     Cranial Nerves: No cranial  nerve deficit.     Comments: Face symmetric, speech clear, upper extremities unremarkable.  Patient has a 3/5 strength in the left lower extremity, there is 0/5 in the right lower extremity.  Psychiatric:        Mood and Affect: Mood normal.     ED Results / Procedures / Treatments   Labs (all labs ordered are listed, but only abnormal results are displayed) Labs Reviewed  CBC - Abnormal; Notable for the following components:      Result Value   WBC 19.1 (*)    RBC 2.09 (*)    Hemoglobin 7.0 (*)    HCT 22.3 (*)    MCV 106.7 (*)    RDW 17.2 (*)    Platelets 116 (*)    nRBC 0.4 (*)    All other components within normal limits  DIFFERENTIAL - Abnormal; Notable for the  following components:   Neutro Abs 14.7 (*)    Monocytes Absolute 2.6 (*)    Abs Immature Granulocytes 0.24 (*)    All other components within normal limits  CBG MONITORING, ED - Abnormal; Notable for the following components:   Glucose-Capillary 134 (*)    All other components within normal limits  PROTIME-INR  APTT  COMPREHENSIVE METABOLIC PANEL  ETHANOL  RAPID URINE DRUG SCREEN, HOSP PERFORMED  URINALYSIS, ROUTINE W REFLEX MICROSCOPIC  I-STAT CHEM 8, ED    EKG EKG Interpretation  Date/Time:  Friday January 17 2022 11:16:33 EDT Ventricular Rate:  79 PR Interval:  183 QRS Duration: 99 QT Interval:  399 QTC Calculation: 458 R Axis:   -13 Text Interpretation: Sinus rhythm Abnormal R-wave progression, early transition Probable LVH with secondary repol abnrm Inferior infarct, old Baseline wander in lead(s) III aVL Abnormal ECG Confirmed by Carmin Muskrat [phone]) on 01/17/2022 11:32:38 AM  Radiology CT HEAD CODE STROKE WO CONTRAST  Result Date: 01/17/2022 CLINICAL DATA:  Code stroke.  61 year old female EXAM: CT HEAD WITHOUT CONTRAST TECHNIQUE: Contiguous axial images were obtained from the base of the skull through the vertex without intravenous contrast. RADIATION DOSE REDUCTION: This exam was performed according to the departmental dose-optimization program which includes automated exposure control, adjustment of the mA and/or kV according to patient size and/or use of iterative reconstruction technique. COMPARISON:  Head CT 05/31/2017. FINDINGS: Brain: Normal cerebral volume. No midline shift, mass effect, or evidence of intracranial mass lesion. No ventriculomegaly. No acute intracranial hemorrhage identified. Stable chronic right thalamic lacunar infarct since 2019. Otherwise stable mild scattered cerebral white matter hypodensity, more pronounced on the left. No cortically based acute infarct identified. Vascular: No suspicious intracranial vascular hyperdensity. Skull: No acute  osseous abnormality identified. Sinuses/Orbits: new bilateral maxillary and sphenoid sinus mucosal thickening and small fluid levels. Other Visualized paranasal sinuses and mastoids are clear. Other: Disconjugate gaze, but no other acute orbit or scalp soft tissue finding. ASPECTS [facility] Stroke Program Early CT Score) - Ganglionic level infarction (caudate, Total score (0-10 with 10 being normal): 10 IMPRESSION: 1. No acute cortically based infarct or acute intracranial hemorrhage identified. ASPECTS 10. Stable noncontrast CT appearance of small vessel disease since 2019 2. New paranasal sinus inflammation. Study discussed by telephone with Dr. Carmin Muskrat on 01/17/2022 at 11:15 . Electronically Signed   By: Genevie Ann M.D.   On: 01/17/2022 11:17   DG Chest Port 1 View  Result Date: 01/15/2022 CLINICAL DATA:  Status post thoracic aortic endovascular stent graft. EXAM: PORTABLE CHEST 1 VIEW COMPARISON:  Chest radiograph 06/01/2021; CT chest 12/25/2021  FINDINGS: Interval placement of thoracic aortic stent graft. Postoperative changes of median sternotomy. Trace left pleural effusion. No focal consolidation or pneumothorax. Surgical clips overlie the right axilla. IMPRESSION: Interval placement of thoracic aortic stent graft. Trace left pleural effusion. Electronically Signed   By: Ileana Roup M.D.   On: 01/15/2022 14:01   DG OR LOCAL ABDOMEN  Result Date: 01/15/2022 CLINICAL DATA:  [id number] Surgery follow-up [id number]. Incorrect needle count. EXAM: OR LOCAL ABDOMEN COMPARISON:  04/02/2021 FINDINGS: No unexpected radiopaque foreign body. Nonobstructive bowel gas pattern. IMPRESSION: No visible radiopaque foreign body. These results were called by telephone at the time of interpretation on 01/15/2022 at 1:39 pm to provider [facility] , who verbally acknowledged these results. Electronically Signed   By: Rolm Baptise M.D.   On: 01/15/2022 13:40    Procedures Procedures    Medications Ordered in  ED Medications  sodium chloride 0.9 % bolus 500 mL (500 mLs Intravenous New Bag/Given 01/17/22 1132)    Followed by  0.9 %  sodium chloride infusion (100 mL/hr Intravenous New Bag/Given 01/17/22 1133)    ED Course/ Medical Decision Making/ A&P This patient with a Hx of thoracic aneurysm repair, discharged from our facility yesterday, as well as hypertension, presents to the ED for concern of lower extremity weakness right complete, left diminished, this involves an extensive number of treatment options, and is a complaint that carries with it a high risk of complications and morbidity.    The differential diagnosis includes stroke versus hypoperfusion versus infection   Social Determinants of Health:  Age, medical illiteracy  Additional history obtained:  Additional history and/or information obtained from EMS, details included above, and the patient's vascular surgeon with whom I discussed the case soon after the patient's arrival, notable for recent vascular thoracic aortic repair, with suspicion for hypoperfusion distal   After the initial evaluation, orders, including: CT labs were initiated.   Patient placed on Cardiac and Pulse-Oximetry Monitors. The patient was maintained on a cardiac monitor.  The cardiac monitored showed an rhythm of 80 sinus normal The patient was also maintained on pulse oximetry. The readings were typically 100% room air normal   On repeat evaluation of the patient stayed the same  Lab Tests:  I personally interpreted labs.  The pertinent results include: Leukocytosis  Imaging Studies ordered:  I independently visualized and interpreted imaging which showed head CT, old infarct, no acute findings I discussed this with the radiologist I agree with the radiologist interpretation  Consultations Obtained:  I discussed the patient's case with her vascular surgeon, almost immediately after the patient's arrival.  With concern as above for post repair  hypoperfusion, patient will have expeditious transfer to our affiliated care center, ICU.  I discussed this with our neurology colleagues as well, code stroke canceled, and the patient's head CT was generally reassuring. With concern for new lower extremity weakness in the context of recent thoracic aneurysm repair, patient will require fluid resuscitation, with a goal of MAP greater than 100, especially is transferred to our affiliated care center.  Patient aware of all findings thus far, need for transfer.  CRITICAL CARE Performed by: Carmin Muskrat Total critical care time: 35 minutes Critical care time was exclusive of separately billable procedures and treating other patients. Critical care was necessary to treat or prevent imminent or life-threatening deterioration. Critical care was time spent personally by me on the following activities: development of treatment plan with patient and/or surrogate as well as nursing, discussions with consultants,  evaluation of patient's response to treatment, examination of patient, obtaining history from patient or surrogate, ordering and performing treatments and interventions, ordering and review of laboratory studies, ordering and review of radiographic studies, pulse oximetry and re-evaluation of patient's condition.   Final Clinical Impression(s) / ED Diagnoses Final diagnoses:  Weakness of both lower extremities     Carmin Muskrat, MD 01/17/22 1135

## 2022-01-17 NOTE — ED Triage Notes (Signed)
Pt bib care link from [facility]; stent placed in thoracic aorta 2 days ago; used walker yesterday; woke 0300 this am, unable to ambulate; significant weakness in R leg w/ decreased sensation; code stroke called, then cancelled; 20 ga rfa; 500 bolus NS, infusing at 100 now; NSR on monitor; bp 366K systolic; c/o back and L shoulder pain x 1 day; carelink staff reports in for ICU admission at [facility]; R legs slightly cooler than L ; good pulses throughout; [place] last night; a and o x 4; 127/72, HR 70s, 97% RA, RR 18

## 2022-01-17 NOTE — Progress Notes (Signed)
1057 call time 1056 beeper time 1110 exam started 1112 exam finished 1112 images sent to soc 1113 exam completed in epic 1113 [facility] radiology called

## 2022-01-17 NOTE — Progress Notes (Signed)
Telestroke RN Note:  9179: Code stroke activated via cart.   1100: Page sent to Dr. Quinn Axe.  1105: Patient taken to CT.   1106: Dr. Quinn Axe virtually present. Background given to Dr. Quinn Axe from [place]. [place] 0300 & patient recently had surgery. Code stroke cancelled by Dr. Quinn Axe due to BLE weakness.   1108: Signed off cart.

## 2022-01-17 NOTE — ED Notes (Signed)
Pt brought in by [facility] for a code stroke; pt told ems she got up at 3am today and was unable to walk; pt states she as discharged from [facility] yesterday and was able to walk  Pt was admitted to hospital and had cardiac stents placed on Wednesday  Code stroke called in field 1050 Beeped out at 1051 CT called at 40 EMS arrived at 1055 Code cart button hit at 1056 Pt pt to CT at 1106 Code stroke cancelled at 81  Pt is to be transferred to [facility] with accepting doctor as Dr. Nechama Guard per Dr. Vanita Panda

## 2022-01-17 NOTE — Anesthesia Procedure Notes (Addendum)
Central Venous Catheter Insertion Performed by: Darral Dash, DO, anesthesiologist Start/End10/13/2023 2:00 PM, 01/17/2022 2:15 PM Patient location: Pre-op. Preanesthetic checklist: patient identified, IV checked, site marked, risks and benefits discussed, surgical consent, monitors and equipment checked, pre-op evaluation, timeout performed and anesthesia consent Position: Trendelenburg Lidocaine 1% used for infiltration and patient sedated Hand hygiene performed , maximum sterile barriers used  and Seldinger technique used Catheter size: 8 Fr Total catheter length 16. Central line was placed.Double lumen Procedure performed using ultrasound guided technique. Ultrasound Notes:anatomy identified, needle tip was noted to be adjacent to the nerve/plexus identified, no ultrasound evidence of intravascular and/or intraneural injection and image(s) printed for medical record Attempts: 1 Following insertion, dressing applied, line sutured and Biopatch. Post procedure assessment: blood return through all ports  Patient tolerated the procedure well with no immediate complications.
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
## 2022-01-17 NOTE — Telephone Encounter (Signed)
Pt's sister, Colletta [place], called stating that the pt was having significant complications. The pt got up to use the bathroom and her R leg "gave out" and she fell. She had to call 911 to have the pt put back to bed. Pt states that her R leg is like dead weight, she can't move it at all. Pt denies any pain and admits to full feeling of leg. Pt was asked if she could wiggle her toes or move her foot. Pt attempted, but was unable. Pt has no other stroke like symptoms. Spoke with Sam, PA who advised that pt return to ED ASAP. 911 had already been called. Sam, PA spoke with Dr Trula Slade who agreed with POC and stated that she most likely needed a lumbar drain. Asked to call ED, spoke with Alwyn Pea, who took information in preparation of pt's arrival. Confirmed understanding.

## 2022-01-17 NOTE — Anesthesia Procedure Notes (Addendum)
Spinal  Patient location during procedure: pre-op Start time: 01/17/2022 1:45 PM End time: 01/17/2022 2:00 PM Reason for block: at surgeon's request Staffing Performed: anesthesiologist  Anesthesiologist: Darral Dash, DO Performed by: Darral Dash, DO Authorized by: Darral Dash, DO   Preanesthetic Checklist Completed: patient identified, IV checked, site marked, risks and benefits discussed, surgical consent, monitors and equipment checked, pre-op evaluation and timeout performed Spinal Block Patient position: sitting Prep: DuraPrep Patient monitoring: heart rate, cardiac monitor, continuous pulse ox and blood pressure Approach: midline Location: L4-5 Injection technique: catheter Needle Needle type: Tuohy  Needle gauge: 14. Needle length: 9 cm Needle insertion depth: 7 cm Catheter type: closed end flexible Catheter at skin depth: 10 cm Assessment Events: CSF return Additional Notes - Lumbar drain placed at surgeon request under sterile conditions without complication  Patient identified. Risks/Benefits/Options discussed with patient including but not limited to bleeding, infection, nerve damage, paralysis, failed block, incomplete pain control, headache, blood pressure changes, nausea, vomiting, reactions to medications, itching and postpartum back pain. Confirmed with bedside nurse the patient's most recent platelet count. Confirmed with patient that they are not currently taking any anticoagulation, have any bleeding history or any family history of bleeding disorders. Patient expressed understanding and wished to proceed. All questions were answered. Sterile technique was used throughout the entire procedure. Please see nursing notes for vital signs. Warning signs of high block given to the patient including shortness of breath, tingling/numbness in hands, complete motor block, or any concerning symptoms with instructions to call for help. Patient was  given instructions on fall risk and not to get out of bed. All questions and concerns addressed with instructions to call with any issues or inadequate analgesia.

## 2022-01-17 NOTE — ED Provider Notes (Addendum)
Patient transferred from [facility].  Please see previous provider, Dr. Vanita Panda, note for further details.  In short patient here for onset weakness in bilateral lower extremities right greater than left, right-sided inability to move at all.  Onset 7 hours prior to ED arrival at [facility].  Patient recently had thoracic aortic aneurysm repair.  Dr. Carlis Abbott, vascular surgery, has been paged and states he is on the way down to assess the patient.   Dagoberto Ligas, PA-C, has stated that this patient will be admitted for suspected spinal cord ischemia to ICU. Patient will need spinal drain placement with anesthesia as well as CCM consult for titrating pressors. Patient amenable to plan.     Elgie Congo, MD 01/17/22 1303    Azucena Cecil, PA-C 01/17/22 1305    Elgie Congo, MD 01/17/22 1400

## 2022-01-17 NOTE — ED Notes (Signed)
Report given to [facility]. 

## 2022-01-17 NOTE — Discharge Summary (Signed)
EVAR Discharge Summary   Dorothy Harris October 04, 1960 61 y.o. female  MRN: [id number]  Admission Date: 01/15/2022  Discharge Date: 01/16/22  Physician: Dr. Trula Slade  Admission Diagnosis: Thoracic aortic aneurysm [facility]) [I71.20]  Discharge Day services:    See progress note 01/16/22  Hospital Course:  [name] is a 61 year old female who was brought in as an outpatient and underwent #1:  Endovascular repair of thoracic aortic aneurysm with coverage of left subclavian artery                       #2:  U/S guided access, left femoral artery                       #3:  Aortic arch and abdominal aortogram                       #4:  open left femoral artery exposure                       #5:  left femoral endarterectomy with bovine patch angioplasty                       #6:  Left leg thrombectomy                       #7:  left leg angiogram She tolerated the procedure well and was admitted to the hospital postoperatively.  POD 1 left groin incision was clean dry and intact without any hematoma.  Bilateral lower extremities are well-perfused with palpable pedal pulses.  She was ambulating without difficulty and had no deficit on exam.  Pain was controlled with p.o. pain medication and she was ready for discharge home.  She will follow-up in the office in 1 month with CTA chest abdomen and pelvis.  She was prescribed 2 to 3 days of narcotic pain medication for continued postoperative pain control.  She will continue her aspirin and statin daily.  Discharge instructions were reviewed with the patient and she voiced her understanding.  She was discharged home in stable condition.  CBC    Component Value Date/Time   WBC 19.1 (H) 01/17/2022 1106   RBC 2.09 (L) 01/17/2022 1106   HGB 7.0 (L) 01/17/2022 1106   HCT 22.3 (L) 01/17/2022 1106   PLT 116 (L) 01/17/2022 1106   MCV 106.7 (H) 01/17/2022 1106   MCH 33.5 01/17/2022 1106   MCHC 31.4 01/17/2022 1106   RDW 17.2 (H) 01/17/2022 1106    LYMPHSABS 1.5 01/17/2022 1106   MONOABS 2.6 (H) 01/17/2022 1106   EOSABS 0.0 01/17/2022 1106   BASOSABS 0.0 01/17/2022 1106    BMET    Component Value Date/Time   NA 140 01/16/2022 0408   NA 139 12/23/2021 0000   K 5.1 01/16/2022 0408   CL 113 (H) 01/16/2022 0408   CO2 17 (L) 01/16/2022 0408   GLUCOSE 129 (H) 01/16/2022 0408   BUN 28 (H) 01/16/2022 0408   BUN 24 (A) 12/23/2021 0000   CREATININE 2.44 (H) 01/16/2022 0408   CREATININE 1.69 (H) 08/21/2017 0935   CALCIUM 8.7 (L) 01/16/2022 0408   GFRNONAA 22 (L) 01/16/2022 0408   GFRNONAA 33 (L) 08/21/2017 0935   GFRAA 53 (L) 10/07/2017 0720   GFRAA 39 (L) 08/21/2017 0935         Discharge Diagnosis:  Thoracic aortic aneurysm (  [place]) [I71.20]  Secondary Diagnosis: Patient Active Problem List   Diagnosis Date Noted   Thoracic aortic aneurysm ([place]) 01/15/2022   Annual physical exam 11/05/2021   Depression, major, single episode, complete remission ([place]) 06/21/2021   S/P exploratory laparotomy 06/21/2021   Mass of right breast 06/21/2021   Disability examination 06/21/2021   Pressure injury of skin 06/02/2021   Hyperkalemia 06/01/2021   Acute hyperkalemia 06/01/2021   PAF (paroxysmal atrial fibrillation) ([place]) 05/07/2021   Essential hypertension 05/07/2021   Pure hypercholesterolemia 05/07/2021   Debility 04/15/2021   Acute hypoxemic respiratory failure ([place]) 04/07/2021   Malnutrition of moderate degree 04/03/2021   Encounter for postanesthesia care 04/02/2021   Peritonitis ([place]) 04/02/2021   Endotracheally intubated 04/02/2021   AKI (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 04/02/2021   S/P ascending aortic replacement 03/25/2021   Encounter for weaning from ventilator [facility])    Status post surgery 03/24/2021   Tobacco abuse 07/30/2017   History of drug use 07/30/2017   Influenza, pneumonia    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   Chest pain  05/31/2017   Coronary artery disease due to lipid rich plaque    Acute on chronic diastolic CHF (congestive heart failure) ([place])    Acute coronary syndrome ([place]) 12/23/2015   NSTEMI (non-ST elevated myocardial infarction) ([place]) 12/22/2015   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HCC)    Anemia    Chronic kidney disease    Essential hypertension 05/07/2021   GERD (gastroesophageal reflux disease)    Hernia, epigastric    Hypertension    PAF (paroxysmal atrial fibrillation) ([place]) 05/07/2021   Pure hypercholesterolemia 05/07/2021     Allergies as of 01/16/2022       Reactions   Chlorthalidone Other (See Comments)   ELEVATED KIDNEY FUNCTION         Medication List     TAKE these medications    acetaminophen 325 MG tablet Commonly known as: TYLENOL Take 1-2 tablets (325-650 mg total) by mouth every 4 (four) hours as needed for mild pain.   amiodarone 200 MG tablet Commonly known as: PACERONE Take 1 tablet (200 mg total) by mouth daily.   amLODipine 10 MG tablet Commonly known as: NORVASC Take 10 mg by mouth daily.   aspirin 81 MG chewable tablet Chew 4 tablets (324 mg total) by mouth daily.   atorvastatin 80 MG tablet Commonly known as: LIPITOR Take 1 tablet (80 mg total) by mouth daily.   FeroSul 325 (65 FE) MG tablet Generic drug: ferrous sulfate Take 1 tablet (325 mg total) by mouth daily with breakfast.   metoprolol tartrate 25 MG tablet Commonly known as: LOPRESSOR Take 1 tablet (25 mg total) by mouth 2 (two) times daily.   multivitamin with minerals Tabs tablet Take 1 tablet by mouth daily.   oxyCODONE 5 MG immediate release tablet Commonly known as: Oxy IR/ROXICODONE Take 1 tablet (5 mg total) by mouth every 6 (six) hours as needed for moderate pain.   pantoprazole 40 MG tablet Commonly known as: PROTONIX Take 1 tablet (40 mg total) by mouth daily.   sodium bicarbonate 650 MG tablet Take 1,300 mg by mouth 2 (two) times  daily.        Discharge Instructions:   [facility]  Discharge Instructions Endovascular Aortic Aneurysm Repair  Please refer to the following instructions for your post-procedure care. Your surgeon or Physician  Assistant will discuss any changes with you.  Activity  You are encouraged to walk as much as you can. You can slowly return to normal activities but must avoid strenuous activity and heavy lifting until your doctor tells you it's OK. Avoid activities such as vacuuming or swinging a gold club. It is normal to feel tired for several weeks after your surgery. Do not drive until your doctor gives the OK and you are no longer taking prescription pain medications. It is also normal to have difficulty with sleep habits, eating, and bowel movements after surgery. These will go away with time.  Bathing/Showering  You may shower after you go home. If you have an incision, do not soak in a bathtub, hot tub, or swim until the incision heals completely.  Incision Care  Shower every day. Clean your incision with mild soap and water. Pat the area dry with a clean towel. You do not need a bandage unless otherwise instructed. Do not apply any ointments or creams to your incision. If you clothing is irritating, you may cover your incision with a dry gauze pad.  Diet  Resume your normal diet. There are no special food restrictions following this procedure. A low fat/low cholesterol diet is recommended for all patients with vascular disease. In order to heal from your surgery, it is CRITICAL to get adequate nutrition. Your body requires vitamins, minerals, and protein. Vegetables are the best source of vitamins and minerals. Vegetables also provide the perfect balance of protein. Processed food has little nutritional value, so try to avoid this.  Medications  Resume taking all of your medications unless your doctor or Physician Assistnat tells you not to. If your  incision is causing pain, you may take over-the-counter pain relievers such as acetaminophen (Tylenol). If you were prescribed a stronger pain medication, please be aware these medications can cause nausea and constipation. Prevent nausea by taking the medication with a snack or meal. Avoid constipation by drinking plenty of fluids and eating foods with a high amount of fiber, such as fruits, vegetables, and grains. Do not take Tylenol if you are taking prescription pain medications.   Follow up  [place] office will schedule a follow-up appointment with a C.T. scan 3-4 weeks after your surgery.  Please call us immediately for any of the following conditions  Severe or worsening pain in your legs or feet or in your abdomen back or chest. Increased pain, redness, drainage (pus) from your incision sit. Increased abdominal pain, bloating, nausea, vomiting or persistent diarrhea. Fever of 101 degrees or higher. Swelling in your leg (s),  Reduce your risk of vascular disease  Stop smoking. If you would like help call [facility] at 1-800-QUIT-NOW 828-321-2778) or [place] at ([phone]. Manage your cholesterol Maintain a desired weight Control your diabetes Keep your blood pressure down  If you have questions, please call the office at [phone].    Disposition: home  Patient's condition: is Good  Follow up: 1. Dr. Trula Slade in 4 weeks with CTA protocol   Dagoberto Ligas, PA-C [facility] [phone] 01/17/2022  11:29 AM   - For VQI Registry use - Post-op:  Time to Extubation: $RemoveBefore'[x]'PKlDpkRhcgMDn$  In OR, $Remo'[ ]'OUFpf$  < 12 hrs, $Remov'[ ]'UOIdom$  12-24 hrs, $RemoveBe'[ ]'LVwicBgSw$  >=24 hrs Vasopressors Req. Post-op: No MI: No., $Remove'[ ]'arBQWXL$  Troponin only, $RemoveBefore'[ ]'yuInKttfTBCbQ$  EKG or Clinical New Arrhythmia: No CHF: No ICU Stay: 0 days Transfusion: No    Complications: Resp failure: No., $RemoveBefor'[ ]'CuwrJULqQCei$  Pneumonia, $RemoveBef'[ ]'VFyFbtNkBM$  Ventilator  Chg in renal function: No., [ ]  Inc. Cr > 0.5, [ ]  Temp. Dialysis,  [ ]  Permanent dialysis Leg ischemia: No., no  Surgery needed, [ ]  Yes, Surgery needed,  [ ]  Amputation Bowel ischemia: No., [ ]  Medical Rx, [ ]  Surgical Rx Wound complication: No., [ ]  Superficial separation/infection, [ ]  Return to OR Return to OR: No  Return to OR for bleeding: No Stroke: No., [ ]  Minor, [ ]  Major  Discharge medications: Statin use:  Yes  ASA use:  Yes  Plavix use:  No  Beta blocker use:  Yes  ARB use:  No ACEI use:  No CCB use:  Yes

## 2022-01-17 NOTE — Progress Notes (Addendum)
Pharmacy Antibiotic Note  Dorothy Harris is a 61 y.o. female admitted on 01/17/2022 with sepsis, fever.  Pharmacy has been consulted for Vancomycin and Cefepime dosing. S/p lumbar drain placement 10/13 for BLE weakness s/p TVAR, complicated by left leg clotting and required thrombectomy 2 days ago.  SCr 2.06 (at baseline). Tm 101.5 and WBC up to 19.1.   Plan: Cefepime 2gm IV q12h Vancomycin 1500 mg IV now then 1250 mg IV Q 36 hrs. Goal AUC 400-550. Expected AUC: 497 SCr used: 2.06 Will f/u renal function, micro data, and pt's clinical condition Vanc levels prn     Temp (24hrs), Avg:98.7 F (37.1 C), Min:98.4 F (36.9 C), Max:99 F (37.2 C)  Recent Labs  Lab 01/15/22 0737 01/15/22 1300 01/16/22 0408 01/17/22 1105 01/17/22 1106  WBC  --  13.6* 18.4*  --  19.1*  CREATININE 2.56* 2.34* 2.44* 2.20* 2.06*    Estimated Creatinine Clearance: 32.3 mL/min (A) (by C-G formula based on SCr of 2.06 mg/dL (H)).    Allergies  Allergen Reactions   Chlorthalidone Other (See Comments)    ELEVATED KIDNEY FUNCTION     Antimicrobials this admission: 10/13 Vanc >>   Microbiology results:  BCx: pending  Thank you for allowing pharmacy to be a part of this patient's care.  Sherlon Handing, PharmD, BCPS Please see amion for complete clinical pharmacist phone list 01/17/2022 4:27 PM

## 2022-01-17 NOTE — ED Notes (Signed)
Attempted report to [place]; receiving RN at lunch; left number for receiving RN to call this RN back for report when available

## 2022-01-17 NOTE — Anesthesia Preprocedure Evaluation (Addendum)
Anesthesia Evaluation  Patient identified by MRN, date of birth, ID band Patient awake    Airway Mallampati: II  TM Distance: >3 FB Neck ROM: Full    Dental no notable dental hx.    Pulmonary Current Smoker and Patient abstained from smoking.   Pulmonary exam normal        Cardiovascular hypertension, Pt. on medications and Pt. on home beta blockers + CAD, + Past MI, + Peripheral Vascular Disease and +CHF  + dysrhythmias Atrial Fibrillation  Rhythm:Regular Rate:Normal  S/P aortic arch reconstruction for type I dissection complicated by aneurysmal changes requiring TEVAR 01/15/22. 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   Neuro/Psych    Depression       GI/Hepatic Neg liver ROS,GERD  ,,  Endo/Other  negative endocrine ROS    Renal/GU   negative genitourinary   Musculoskeletal negative musculoskeletal ROS (+)    Abdominal Normal abdominal exam  (+)   Peds  Hematology  (+) Blood dyscrasia, anemia   Anesthesia Other Findings   Reproductive/Obstetrics                             Anesthesia Physical Anesthesia Plan  ASA: 4  Anesthesia Plan:    Post-op Pain Management:    Induction:   PONV Risk Score and Plan: 1  Airway Management Planned: Natural Airway and Nasal Cannula  Additional Equipment: Spinal Drain, CVP and Arterial line  Intra-op Plan:   Post-operative Plan:   Informed Consent:   Plan Discussed with:   Anesthesia Plan Comments: (- Lumbar drain, CVC, arterial line to be placed at surgeon request  Lab Results      Component                Value               Date                      WBC                      19.1 (H)            01/17/2022                HGB                      7.0 (L)             01/17/2022                HCT                      22.3 (L)            01/17/2022                MCV                      106.7 (H)            01/17/2022                PLT                      116 (L)             01/17/2022  Lab Results      Component                Value               Date                      NA                       140                 01/17/2022                K                        4.4                 01/17/2022                CO2                      20 (L)              01/17/2022                GLUCOSE                  131 (H)             01/17/2022                BUN                      23 (H)              01/17/2022                CREATININE               2.06 (H)            01/17/2022                CALCIUM                  8.6 (L)             01/17/2022                EGFR                     26                  12/23/2021                GFRNONAA                 27 (L)              01/17/2022           )       Anesthesia Quick Evaluation
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
## 2022-01-18 ENCOUNTER — Inpatient Hospital Stay (HOSPITAL_COMMUNITY): Payer: PRIVATE HEALTH INSURANCE

## 2022-01-18 DIAGNOSIS — G9511 Acute infarction of spinal cord (embolic) (nonembolic): Secondary | ICD-10-CM | POA: Diagnosis not present

## 2022-01-18 DIAGNOSIS — N184 Chronic kidney disease, stage 4 (severe): Secondary | ICD-10-CM | POA: Diagnosis not present

## 2022-01-18 DIAGNOSIS — R509 Fever, unspecified: Secondary | ICD-10-CM | POA: Diagnosis not present

## 2022-01-18 DIAGNOSIS — R29898 Other symptoms and signs involving the musculoskeletal system: Secondary | ICD-10-CM | POA: Diagnosis not present

## 2022-01-18 LAB — URINALYSIS, ROUTINE W REFLEX MICROSCOPIC
Bilirubin Urine: NEGATIVE
Glucose, UA: NEGATIVE mg/dL
Ketones, ur: NEGATIVE mg/dL
Nitrite: NEGATIVE
Protein, ur: 30 mg/dL — AB
Specific Gravity, Urine: 1.013 (ref 1.005–1.030)
pH: 5 (ref 5.0–8.0)

## 2022-01-18 LAB — CBC
HCT: 26.8 % — ABNORMAL LOW (ref 36.0–46.0)
Hemoglobin: 8.6 g/dL — ABNORMAL LOW (ref 12.0–15.0)
MCH: 32.7 pg (ref 26.0–34.0)
MCHC: 32.1 g/dL (ref 30.0–36.0)
MCV: 101.9 fL — ABNORMAL HIGH (ref 80.0–100.0)
Platelets: 107 10*3/uL — ABNORMAL LOW (ref 150–400)
RBC: 2.63 MIL/uL — ABNORMAL LOW (ref 3.87–5.11)
RDW: 17.1 % — ABNORMAL HIGH (ref 11.5–15.5)
WBC: 19.9 10*3/uL — ABNORMAL HIGH (ref 4.0–10.5)
nRBC: 0.7 % — ABNORMAL HIGH (ref 0.0–0.2)

## 2022-01-18 LAB — BASIC METABOLIC PANEL WITH GFR
Anion gap: 10 (ref 5–15)
BUN: 18 mg/dL (ref 6–20)
CO2: 17 mmol/L — ABNORMAL LOW (ref 22–32)
Calcium: 8.3 mg/dL — ABNORMAL LOW (ref 8.9–10.3)
Chloride: 113 mmol/L — ABNORMAL HIGH (ref 98–111)
Creatinine, Ser: 1.84 mg/dL — ABNORMAL HIGH (ref 0.44–1.00)
GFR, Estimated: 31 mL/min — ABNORMAL LOW
Glucose, Bld: 129 mg/dL — ABNORMAL HIGH (ref 70–99)
Potassium: 4.1 mmol/L (ref 3.5–5.1)
Sodium: 140 mmol/L (ref 135–145)

## 2022-01-18 LAB — PROTIME-INR
INR: 1.3 — ABNORMAL HIGH (ref 0.8–1.2)
INR: 1.3 — ABNORMAL HIGH (ref 0.8–1.2)
Prothrombin Time: 15.6 seconds — ABNORMAL HIGH (ref 11.4–15.2)
Prothrombin Time: 15.7 seconds — ABNORMAL HIGH (ref 11.4–15.2)

## 2022-01-18 LAB — BPAM RBC
Blood Product Expiration Date: 202311042359
Blood Product Expiration Date: 202311042359
ISSUE DATE / TIME: 202310131735
ISSUE DATE / TIME: 202310132057
Unit Type and Rh: 6200
Unit Type and Rh: 6200

## 2022-01-18 LAB — BASIC METABOLIC PANEL
Anion gap: 10 (ref 5–15)
BUN: 16 mg/dL (ref 6–20)
CO2: 19 mmol/L — ABNORMAL LOW (ref 22–32)
Calcium: 8.7 mg/dL — ABNORMAL LOW (ref 8.9–10.3)
Chloride: 110 mmol/L (ref 98–111)
Creatinine, Ser: 1.9 mg/dL — ABNORMAL HIGH (ref 0.44–1.00)
GFR, Estimated: 30 mL/min — ABNORMAL LOW (ref >60)
Glucose, Bld: 137 mg/dL — ABNORMAL HIGH (ref 70–99)
Potassium: 3.9 mmol/L (ref 3.5–5.1)
Sodium: 139 mmol/L (ref 135–145)

## 2022-01-18 LAB — MAGNESIUM: Magnesium: 2 mg/dL (ref 1.7–2.4)

## 2022-01-18 LAB — GLUCOSE, CAPILLARY
Glucose-Capillary: 123 mg/dL — ABNORMAL HIGH (ref 70–99)
Glucose-Capillary: 138 mg/dL — ABNORMAL HIGH (ref 70–99)
Glucose-Capillary: 143 mg/dL — ABNORMAL HIGH (ref 70–99)

## 2022-01-18 LAB — TYPE AND SCREEN
ABO/RH(D): A POS
Antibody Screen: NEGATIVE
Unit division: 0
Unit division: 0

## 2022-01-18 LAB — APTT
aPTT: 38 seconds — ABNORMAL HIGH (ref 24–36)
aPTT: 39 seconds — ABNORMAL HIGH (ref 24–36)

## 2022-01-18 LAB — RAPID URINE DRUG SCREEN, HOSP PERFORMED
Amphetamines: NOT DETECTED
Barbiturates: NOT DETECTED
Benzodiazepines: NOT DETECTED
Cocaine: NOT DETECTED
Opiates: POSITIVE — AB
Tetrahydrocannabinol: NOT DETECTED

## 2022-01-18 MED ORDER — PANTOPRAZOLE SODIUM 40 MG PO TBEC
40.0000 mg | DELAYED_RELEASE_TABLET | Freq: Every day | ORAL | Status: DC
  Administered 2022-01-18 – 2022-01-29 (×12): 40 mg via ORAL
  Filled 2022-01-18 (×12): qty 1

## 2022-01-18 MED ORDER — SODIUM BICARBONATE 650 MG PO TABS
1300.0000 mg | ORAL_TABLET | Freq: Two times a day (BID) | ORAL | Status: DC
  Administered 2022-01-18 – 2022-01-29 (×23): 1300 mg via ORAL
  Filled 2022-01-18 (×23): qty 2

## 2022-01-18 MED ORDER — ATORVASTATIN CALCIUM 80 MG PO TABS
80.0000 mg | ORAL_TABLET | Freq: Every day | ORAL | Status: DC
  Administered 2022-01-18 – 2022-01-29 (×12): 80 mg via ORAL
  Filled 2022-01-18 (×11): qty 1

## 2022-01-18 MED ORDER — AMIODARONE HCL 200 MG PO TABS
200.0000 mg | ORAL_TABLET | Freq: Every day | ORAL | Status: DC
  Administered 2022-01-18 – 2022-01-29 (×12): 200 mg via ORAL
  Filled 2022-01-18 (×12): qty 1

## 2022-01-18 MED ORDER — ORAL CARE MOUTH RINSE: 15.0000 mL | OROMUCOSAL | Status: DC | PRN

## 2022-01-18 MED ORDER — LACTATED RINGERS IV BOLUS
1000.0000 mL | Freq: Once | INTRAVENOUS | Status: AC
  Administered 2022-01-18: 1000 mL via INTRAVENOUS

## 2022-01-18 NOTE — Anesthesia Post-op Follow-up Note (Signed)
  Anesthesia Pain Follow-up Note  Patient: Dorothy Harris  Day #  1  Date of Follow-up: 01/18/2022 Time: 11:07 AM  Last Vitals:  Vitals:   01/18/22 0900 01/18/22 1000  BP: (!) 172/79 135/65  Pulse: 85 78  Resp: 20 15  Temp: (!) 38.6 C   SpO2: 99% 100%    Level of Consciousness: alert  Pain: none   Side Effects:None  Catheter Site Exam:dry     Plan: Continue current therapy of postop epidural at surgeon's request - S/P lumbar drain placement 10/13 per surgeon request given bilateral leg weakness with complete absence of right leg motor function. Bilateral 5/5 strength to lower extremities today. Drain site non-tender, non-erythematous, dried old blood at dressing and catheter without migration. Pink tinge to CSF fluid but draining appropriately. Will continue drain at this time per parameters to follow set from primary team  [facility]

## 2022-01-18 NOTE — Progress Notes (Signed)
[facility] [place]  Subjective  -able to move both her lower extremities this morning and bend her knees.   Objective (!) 158/71 74 (!) 100.9 F (38.3 C) 16 100%  Intake/Output Summary (Last 24 hours) at 01/18/2022 0929 Last data filed at 01/18/2022 0800 Gross per 24 hour  Intake 2594.5 ml  Output 3596 ml  Net -1001.5 ml   Abdomen soft Left groin incision clean dry and intact Bilateral lower extremity strength 4 out of 5 Monophasic DP signals bilaterally with more brisk PT signals   Laboratory Lab Results: Recent Labs    01/17/22 1106 01/18/22 0031  WBC 19.1* 19.9*  HGB 7.0* 8.6*  HCT 22.3* 26.8*  PLT 116* 107*   BMET Recent Labs    01/18/22 0032 01/18/22 0500  NA 140 139  K 4.1 3.9  CL 113* 110  CO2 17* 19*  GLUCOSE 129* 137*  BUN 18 16  CREATININE 1.84* 1.90*  CALCIUM 8.3* 8.7*    COAG Lab Results  Component Value Date   INR 1.3 (H) 01/18/2022   INR 1.3 (H) 01/18/2022   INR 1.2 01/17/2022   No results found for: "PTT"  Assessment/Planning:  61 year old female status post repair of thoracic aortic aneurysm on 01/15/2022.  She was discharged home with no lower extremity weakness.  She was readmitted yesterday with bilateral lower extremity weakness with concern for spinal cord ischemia in setting of covered stent graft.  Neuro: Spinal drain placed yesterday by anesthesia.  Concern for bloody drainage last night from spinal drain and I ordered a CT head that shows no evidence of intracranial bleed.  Exam is improved and she has 4 out of 5 strength in both lower extremities and is able to bend her knees and wiggle her toes.  Discussed spinal drain draining up to 30 mL an hour and then clamp up to 300 mL in 24 hours.  Set it to 8 mmHg today as overnight it had been set to 4 mmHg. CV: MAP goal greater than 90.  Remains on 4 mcg of Levophed that has been weaned overnight.  Did get 2 units packed red blood cells yesterday.   Hemoglobin improved from 7 to 8.6. Pulm: No issues GI: We will give her regular diet today. Renal: CKD but appears at baseline. Cr 2.06 --> 1.90. ID: Febrile overnight to 101.8.  Critical care did start broad-spectrum antibiotics now on Vanc cefepime.  Blood cultures pending.  Plan MRI cervical and thoracic spine today.  Appreciate neurology input.  Overall exam is improving and looks much better from a neuro standpoint with spinal drain and MAP push.   Marty Heck 01/18/2022 9:29 AM --

## 2022-01-18 NOTE — Progress Notes (Signed)
NAME:  Dorothy Harris, MRN:  [id number], DOB:  03-26-1961, LOS: 1 ADMISSION DATE:  01/17/2022, CONSULTATION DATE:  01/17/2022 REFERRING MD:  Dr Trula Slade, CHIEF COMPLAINT:  post-TEVAR weakness  History of Present Illness:  61 yo female with known hypertension, hyperlipidemia, s/p endovascular repair of thoracic aortic aneurysm with coverage of the left subclavian artery and left leg thrombectomy on 10/11 admitted today with acute lower extremity weakness with suspected etiology spinal cord ischemia.     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  She follows with Vascular surgery as outpatient.  She developed aneurysmal changes to her descending aorta, measuring 6 mm in diameter and had TVAR planned for 10/11. POD 1, she was moving all extremities and beginning to mobilize, she was discharged home on 10/12.  Today, she presented to the ED at [facility] today with inability to walk at 0300, 7 hours prior to presentation to ED.  VS on presentation: Afebrile, HR: 80, RR 19, BP: 124/63, SpO2 93% on room air.  She received a bolus of IVF and transferred to [facility].   Pertinent  Medical History  AAA GERD HTN Paroxysmal Afib HLD CKD 4 Type I aortic dissection, s/p aortic arch reconsturction Significant Hospital Events: Including procedures, antibiotic start and stop dates in addition to other pertinent events   Admit, Bilateral lower extremity weakness R > L. Lumbar drain placed.   Interim History / Subjective:  Tmax 101.8, started antibiotics overnight. Dry cough, not new. Her only complaint today is back pain from laying in bed.  Objective   Blood pressure (!) 159/68, pulse 79, temperature (!) 100.8 F (38.2 C), resp. rate 14, SpO2 99 %.        Intake/Output Summary (Last 24 hours) at 01/18/2022  0714 Last data filed at 01/18/2022 0600 Gross per 24 hour  Intake 2555.28 ml  Output 3253 ml  Net -697.72 ml   There were no vitals filed for this visit.  Examination: General:  elderly woman lying in bed in NAD HENT: [place]/AT, dry oral mucosa Lungs: breathing comfortably on , CTA anteriorly, reduced in bases Cardiovascular: S1S2, RRR Abdomen: soft, NT. Ostomy on the R with gas, midline incision well healed. Extremities: warm, no cyanosis Neuro: awake and alert, moving all extremities-- 4/5 ankle plantar flexion, able to bend both knees, L>R  CSF drain output 158cc  Biacrb 19 BUN 16 Cr 1.9 WBC 19/9 H/Hh 8.6/26.8 Platelets 107 INR 1.3 UA: +WBC Blood cultures pending CXR personally reviewed> aortic endovascular stent. No lung infiltrates.  Resolved Hospital Problem list     Assessment & Plan:  Bilateral lower extremity weakness, concern for spinal cord ischemia s/p TEVAR.  -- maintain adequate perfusion pressure with MAP >90  norepi as needed to maintain this -frequent neurovascular checks -- maintain HB >8 -maintain euvolemia-- giving additional LR bolus today   -- MRI C and T spine to r/o hematoma -- Appreciate neurology's management  CKD, stage IV --con't PTA bicarb -strict I/O -renally dose meds, avoid nephrotoxic meds -- Continue supportive care, avoid nephrotoxic agents   H/o hypertension -- holding PTA antihypertensives; goal MAP >90  Afib, paroxysmal-- had been isolated to a hospitalization -Amiodarone resumed; not sure if this is still a home med. May help keep her out of Afib again while she is on norepi. -not on PTA [facility] per chart review--  -  monitor on tele  HLD -- statin  Anemia -- goal Hb >8 for spinal perfusion  Thrombocytopenia -- monitor platelets  Chronic ileostomy -- routine ostomy care  Leukocytosis, fevers -- CXR -follow blood cultures -cefepime, vanc  Hyperglycemia -SSI PRN  Former tobacco abuse -quit in 2022; not sure    Best Practice (right click and "Reselect all SmartList Selections" daily)   Diet/type: NPO DVT prophylaxis: SCD GI prophylaxis: PPI (home omeprazole) Lines: Central line Foley:  Yes, and it is still needed Code Status:  full code Last date of multidisciplinary goals of care discussion [Sister at bedside]  Labs   CBC: Recent Labs  Lab 01/15/22 1300 01/16/22 0408 01/17/22 1105 01/17/22 1106 01/18/22 0031  WBC 13.6* 18.4*  --  19.1* 19.9*  NEUTROABS  --   --   --  14.7*  --   HGB 8.8* 7.4* 7.5* 7.0* 8.6*  HCT 28.3* 23.5* 22.0* 22.3* 26.8*  MCV 106.4* 106.3*  --  106.7* 101.9*  PLT 182 149*  --  116* 107*     Basic Metabolic Panel: Recent Labs  Lab 01/15/22 1300 01/16/22 0408 01/17/22 1105 01/17/22 1106 01/18/22 0032 01/18/22 0500  NA 137 140 140 140 140 139  K 5.2* 5.1 4.4 4.4 4.1 3.9  CL 114* 113* 113* 113* 113* 110  CO2 16* 17*  --  20* 17* 19*  GLUCOSE 162* 129* 128* 131* 129* 137*  BUN 29* 28* 20 23* 18 16  CREATININE 2.34* 2.44* 2.20* 2.06* 1.84* 1.90*  CALCIUM 8.2* 8.7*  --  8.6* 8.3* 8.7*  MG 2.0  --   --   --   --  2.0    GFR: Estimated Creatinine Clearance: 35 mL/min (A) (by C-G formula based on SCr of 1.9 mg/dL (H)). Recent Labs  Lab 01/15/22 1300 01/16/22 0408 01/17/22 1106 01/18/22 0031  WBC 13.6* 18.4* 19.1* 19.9*      This patient is critically ill with multiple organ system failure which requires frequent high complexity decision making, assessment, support, evaluation, and titration of therapies. This was completed through the application of advanced monitoring technologies and extensive interpretation of multiple databases. During this encounter critical care time was devoted to patient care services described in this note for 45 minutes.  Julian Hy, DO 01/18/22 8:12 AM [facility]

## 2022-01-19 ENCOUNTER — Inpatient Hospital Stay (HOSPITAL_COMMUNITY): Payer: PRIVATE HEALTH INSURANCE

## 2022-01-19 DIAGNOSIS — D696 Thrombocytopenia, unspecified: Secondary | ICD-10-CM

## 2022-01-19 DIAGNOSIS — G9511 Acute infarction of spinal cord (embolic) (nonembolic): Secondary | ICD-10-CM | POA: Diagnosis not present

## 2022-01-19 DIAGNOSIS — N1832 Chronic kidney disease, stage 3b: Secondary | ICD-10-CM | POA: Diagnosis not present

## 2022-01-19 DIAGNOSIS — R29898 Other symptoms and signs involving the musculoskeletal system: Secondary | ICD-10-CM | POA: Diagnosis not present

## 2022-01-19 LAB — BASIC METABOLIC PANEL
Anion gap: 8 (ref 5–15)
BUN: 11 mg/dL (ref 6–20)
CO2: 21 mmol/L — ABNORMAL LOW (ref 22–32)
Calcium: 8.6 mg/dL — ABNORMAL LOW (ref 8.9–10.3)
Chloride: 109 mmol/L (ref 98–111)
Creatinine, Ser: 1.69 mg/dL — ABNORMAL HIGH (ref 0.44–1.00)
GFR, Estimated: 34 mL/min — ABNORMAL LOW (ref >60)
Glucose, Bld: 157 mg/dL — ABNORMAL HIGH (ref 70–99)
Potassium: 3.8 mmol/L (ref 3.5–5.1)
Sodium: 138 mmol/L (ref 135–145)

## 2022-01-19 LAB — CBC
HCT: 26.1 % — ABNORMAL LOW (ref 36.0–46.0)
Hemoglobin: 8.8 g/dL — ABNORMAL LOW (ref 12.0–15.0)
MCH: 33.3 pg (ref 26.0–34.0)
MCHC: 33.7 g/dL (ref 30.0–36.0)
MCV: 98.9 fL (ref 80.0–100.0)
Platelets: 135 10*3/uL — ABNORMAL LOW (ref 150–400)
RBC: 2.64 MIL/uL — ABNORMAL LOW (ref 3.87–5.11)
RDW: 16.7 % — ABNORMAL HIGH (ref 11.5–15.5)
WBC: 26.4 10*3/uL — ABNORMAL HIGH (ref 4.0–10.5)
nRBC: 0.3 % — ABNORMAL HIGH (ref 0.0–0.2)

## 2022-01-19 LAB — GLUCOSE, CAPILLARY
Glucose-Capillary: 136 mg/dL — ABNORMAL HIGH (ref 70–99)
Glucose-Capillary: 163 mg/dL — ABNORMAL HIGH (ref 70–99)
Glucose-Capillary: 199 mg/dL — ABNORMAL HIGH (ref 70–99)
Glucose-Capillary: 159 mg/dL — ABNORMAL HIGH (ref 70–99)
Glucose-Capillary: 164 mg/dL — ABNORMAL HIGH (ref 70–99)

## 2022-01-19 LAB — SARS CORONAVIRUS 2 BY RT PCR: SARS Coronavirus 2 by RT PCR: NEGATIVE

## 2022-01-19 MED ORDER — DEXTROSE 5 % IV SOLN
250.0000 mg | INTRAVENOUS | Status: DC
  Administered 2022-01-19 – 2022-01-21 (×3): 250 mg via INTRAVENOUS
  Filled 2022-01-19 (×4): qty 2.5

## 2022-01-19 MED ORDER — POTASSIUM CHLORIDE CRYS ER 10 MEQ PO TBCR
20.0000 meq | EXTENDED_RELEASE_TABLET | Freq: Once | ORAL | Status: AC
  Administered 2022-01-19: 20 meq via ORAL
  Filled 2022-01-19: qty 2

## 2022-01-19 NOTE — Progress Notes (Signed)
[facility] [place]  Subjective  -lower extremity exam continues to improve and now able to lift her legs off the bed.  Febrile again last night with rising leukocytosis.   Objective (!) 158/68 69 99.1 F (37.3 C) 14 100%  Intake/Output Summary (Last 24 hours) at 01/19/2022 1036 Last data filed at 01/19/2022 1009 Gross per 24 hour  Intake 1515.54 ml  Output 3388 ml  Net -1872.46 ml   Abdomen soft Left groin incision clean dry and intact Bilateral lower extremity strength 4+ out of 5 Monophasic DP signals bilaterally with more brisk PT signals   Laboratory Lab Results: Recent Labs    01/18/22 0031 01/19/22 0500  WBC 19.9* 26.4*  HGB 8.6* 8.8*  HCT 26.8* 26.1*  PLT 107* 135*   BMET Recent Labs    01/18/22 0500 01/19/22 0500  NA 139 138  K 3.9 3.8  CL 110 109  CO2 19* 21*  GLUCOSE 137* 157*  BUN 16 11  CREATININE 1.90* 1.69*  CALCIUM 8.7* 8.6*    COAG Lab Results  Component Value Date   INR 1.3 (H) 01/18/2022   INR 1.3 (H) 01/18/2022   INR 1.2 01/17/2022   No results found for: "PTT"  Assessment/Planning:  61 year old female status post repair of thoracic aortic aneurysm on 01/15/2022.  She was discharged home with no lower extremity weakness.  She was readmitted Friday with bilateral lower extremity weakness with concern for spinal cord ischemia in setting of covered stent graft.  Neuro: Spinal drain placed Friday by anesthesia.  Concern for bloody drainage Friday night from spinal drain and I ordered a CT head that shows no evidence of intracranial bleed.  Exam continues to improve and she has 4+ out of 5 strength in both lower extremities and is able to lift her legs off the bed now.  Discussed spinal drain draining up to 30 mL an hour and then clamp up to 300 mL in 24 hours.   MRI cervical and thoracic spine overnight with no evidence of spinal cord ischemia. CV: MAP goal greater than 90.  Levophed increased overnight and now  11 mcg.  Hemoglobin 8.8 today.  No indication for transfusion. Pulm: No issues GI: Regular diet. Renal: CKD but appears at baseline. Cr 2.06 --> 1.90 --> 1.69. ID: Febrile again overnight and WBC 19 --> 26.  Critical care did start broad-spectrum antibiotics on Friday night now on Vanc cefepime.  Blood cultures pending with NGTD.  Does not appear toxic.  Exam continues to improve with spinal drain and MAP push.   Marty Heck 01/19/2022 10:36 AM --

## 2022-01-19 NOTE — Anesthesia Post-op Follow-up Note (Signed)
  Anesthesia Pain Follow-up Note  Patient: Dorothy Harris  Day #: 2  Date of Follow-up: 01/19/2022 Time: 3:42 PM  Last Vitals:  Vitals:   01/19/22 1300 01/19/22 1400  BP: (!) 146/62 (!) 163/72  Pulse: 67 73  Resp: (!) 9 11  Temp: 37.2 C 37.3 C  SpO2: 100% 98%    Level of Consciousness: alert  Pain: none   Side Effects:None  Catheter Site Exam:serosanguinous fluid under dressing making insertion depth or catheter impossible to see      Plan: Catheter without CSF flow during today's nursing shift. Asked by Vascular surgery to evaluate. Without taking dressing down unable to determine if catheter has become dislodged or migrated. Given heme tinged CSF on previous shifts, catheter and tubing flushed with Sterile PF Normal Saline. CSF flow not immediately noted. Catheter to remain in place with reevaluation in the morning. If neurologic changes, call Vascular surgery and send CBC/INR/PTT to evaluate for coagulopathy should catheter need to be replaced.   01/19/2022 3:47 PM Oleta Mouse, MD

## 2022-01-19 NOTE — Progress Notes (Signed)
NAME:  Dorothy Harris, MRN:  [id number], DOB:  Aug 13, 1960, LOS: 2 ADMISSION DATE:  01/17/2022, CONSULTATION DATE:  01/17/2022 REFERRING MD:  Dr Trula Slade, CHIEF COMPLAINT:  post-TEVAR weakness  History of Present Illness:  61 yo female with known hypertension, hyperlipidemia, s/p endovascular repair of thoracic aortic aneurysm with coverage of the left subclavian artery and left leg thrombectomy on 10/11 admitted today with acute lower extremity weakness with suspected etiology spinal cord ischemia.     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  She follows with Vascular surgery as outpatient.  She developed aneurysmal changes to her descending aorta, measuring 6 mm in diameter and had TVAR planned for 10/11. POD 1, she was moving all extremities and beginning to mobilize, she was discharged home on 10/12.  Today, she presented to the ED at [facility] today with inability to walk at 0300, 7 hours prior to presentation to ED.  VS on presentation: Afebrile, HR: 80, RR 19, BP: 124/63, SpO2 93% on room air.  She received a bolus of IVF and transferred to [facility].   Pertinent  Medical History  AAA GERD HTN Paroxysmal Afib HLD CKD 4 Type I aortic dissection, s/p aortic arch reconstruction  Significant Hospital Events: Including procedures, antibiotic start and stop dates in addition to other pertinent events   Admit, bilateral lower extremity weakness R > L. Lumbar drain placed.   Interim History / Subjective:  Tmax 101.5.  Coughing up some thick sputum. Has had a headache off and on.   Objective   Blood pressure (!) 153/70, pulse 72, temperature 100 F (37.8 C), resp. rate 13, SpO2 99 %.        Intake/Output Summary (Last 24 hours) at 01/19/2022 0746 Last data filed at 01/19/2022 0700 Gross per 24  hour  Intake 1566.05 ml  Output 3343 ml  Net -1776.95 ml    There were no vitals filed for this visit.  Examination: General:  elderly woman lying in bed in NAD HENT: [place]/AT, eyes anicteric Lungs: breathing comfortably on , reduced basilar breath sounds. No rhonchi or wheezing. No observed coughing. Cardiovascular: S1S2, RRR Abdomen: soft, NT Extremities: warm, dry, no cyanosis.  Neuro: Awake, alert, tearful during our encounter. Stuttering with speech, which she reports is chronic. Able to pick legs up off the bed R>L, moving all extremities  CSF drain output  243  Bicarb 21  BUN 11 Cr 1.69 WBC 26.4 H/Hh 8.8/26.1 Platelets 135 Blood cultures> NGTD   Resolved Hospital Problem list     Assessment & Plan:  Bilateral lower extremity weakness, concern for spinal cord ischemia s/p TEVAR.  No W[place] infarction on MRI. -- maintain adequate perfusion pressure. Needs MAP >90, using norepi to maintain MAP.  -con't lumbar drain per vascular surgery -frequent neurovascular checks -maintain Hb >8 -goal euvolemia -appreciate neurology's management  Ongoing fevers, worsening leukocytosis -repeat CXR -adding azithromycin, con't cefepime & vanc -follow blood cultures -check covid swab  CKD, stage IIIb --con't PTA bicarb  -strict I/O -renally dose meds, avoid nephrotoxic meds -monitor  H/o hypertension -- con't holding PTA antihypertensives; goal MAP >90  Afib, paroxysmal-- had been isolated to a hospitalization -Amiodarone resumed (reports she was still taking it, but hasn't had it filled recently per pharmacy records) Previously had Afib during sepsis, so will keep while she remains on norepi then stop. -  not needing AC per chart review -tele monitoring  HLD -- con't statin  Anemia- stable -- goal Hb >8 for spinal perfusion  Thrombocytopenia-improving. -- monitor platelets  Chronic ileostomy --routine ostomy care  Hyperglycemia, controlled -SSI PRN- hasn't needed  insulin  Former tobacco abuse -quit in 2022; not sure how many pack years, but may need referral for lung cancer screening at discharge  Best Practice (right click and "Reselect all SmartList Selections" daily)   Diet/type: Regular consistency (see orders) DVT prophylaxis: SCD GI prophylaxis: PPI (home omeprazole) Lines: Central line Foley:  Yes, and it is still needed Code Status:  full code Last date of multidisciplinary goals of care discussion [Sister at bedside]  Labs   CBC: Recent Labs  Lab 01/15/22 1300 01/16/22 0408 01/17/22 1105 01/17/22 1106 01/18/22 0031 01/19/22 0500  WBC 13.6* 18.4*  --  19.1* 19.9* 26.4*  NEUTROABS  --   --   --  14.7*  --   --   HGB 8.8* 7.4* 7.5* 7.0* 8.6* 8.8*  HCT 28.3* 23.5* 22.0* 22.3* 26.8* 26.1*  MCV 106.4* 106.3*  --  106.7* 101.9* 98.9  PLT 182 149*  --  116* 107* 135*     Basic Metabolic Panel: Recent Labs  Lab 01/15/22 1300 01/16/22 0408 01/17/22 1105 01/17/22 1106 01/18/22 0032 01/18/22 0500 01/19/22 0500  NA 137 140 140 140 140 139 138  K 5.2* 5.1 4.4 4.4 4.1 3.9 3.8  CL 114* 113* 113* 113* 113* 110 109  CO2 16* 17*  --  20* 17* 19* 21*  GLUCOSE 162* 129* 128* 131* 129* 137* 157*  BUN 29* 28* 20 23* $Remo'18 16 11  'uhind$ CREATININE 2.34* 2.44* 2.20* 2.06* 1.84* 1.90* 1.69*  CALCIUM 8.2* 8.7*  --  8.6* 8.3* 8.7* 8.6*  MG 2.0  --   --   --   --  2.0  --     GFR: Estimated Creatinine Clearance: 39.3 mL/min (A) (by C-G formula based on SCr of 1.69 mg/dL (H)). Recent Labs  Lab 01/16/22 0408 01/17/22 1106 01/18/22 0031 01/19/22 0500  WBC 18.4* 19.1* 19.9* 26.4*      This patient is critically ill with multiple organ system failure which requires frequent high complexity decision making, assessment, support, evaluation, and titration of therapies. This was completed through the application of advanced monitoring technologies and extensive interpretation of multiple databases. During this encounter critical care time was  devoted to patient care services described in this note for 40 minutes.  Julian Hy, DO 01/19/22 8:13 AM [facility]

## 2022-01-19 NOTE — Addendum Note (Signed)
Addendum  created 01/19/22 1547 by Oleta Mouse, MD   Clinical Note Signed

## 2022-01-19 NOTE — Progress Notes (Signed)
Neurology Progress Note  Brief HPI: Patient with history of thoracic aortic aneurysm repair on 10/11 as well as thrombectomy of the left leg, hypertension, atrial fibrillation not on anticoagulation, aortic dissection with repair, hyperlipidemia and smoking presented with bilateral lower extremity weakness.  Initially patient had weakness of right lower extremity greater than left with suspicion for spinal infarct however MRI was nondiagnostic.  Lumbar drain was placed and MAP was raised to greater than 90.  Improvements have been noted in bilateral lower extremity strength since that  Subjective: Patient reports she is doing well and has no new complaints overnight.  Leukocytosis continues and has worsened this morning.  Exam: Vitals:   01/19/22 0800 01/19/22 0900  BP: (!) 145/90 (!) 158/68  Pulse: 71 69  Resp: 14 14  Temp: 99.7 F (37.6 C) 99.1 F (37.3 C)  SpO2: 99% 100%   Gen: In bed, NAD Resp: non-labored breathing, no acute distress   Neuro: Mental Status: Alert and oriented x3 able to converse normally. Speech clear and fluent Cranial Nerves: Pupils equal and round, extraocular movements intact, facial sensation symmetric, facial movement symmetric, hearing intact, phonation normal, shoulder shrug symmetric, tongue midline Motor: Able to move bilateral upper extremities with 5 out of 5 strength.  Strength in lower extremities is improved with 4+ out of 5 strength at hip flexion bilaterally, 4+ out of 5 knee extension and knee flexion bilaterally, 4+ out of 5 dorsiflexion and plantarflexion on the left and 4 out of 5 dorsiflexion and plantarflexion on the right Sensory: Intact to light touch in all 4 extremities Gait: Deferred  Pertinent Labs:    Latest Ref Rng & Units 01/19/2022    5:00 AM 01/18/2022   12:31 AM 01/17/2022   11:06 AM  CBC  WBC 4.0 - 10.5 K/uL 26.4  19.9  19.1   Hemoglobin 12.0 - 15.0 g/dL 8.8  8.6  7.0   Hematocrit 36.0 - 46.0 % 26.1  26.8  22.3    Platelets 150 - 400 K/uL 135  107  116        Latest Ref Rng & Units 01/19/2022    5:00 AM 01/18/2022    5:00 AM 01/18/2022   12:32 AM  BMP  Glucose 70 - 99 mg/dL 157  137  129   BUN 6 - 20 mg/dL $Remove'11  16  18   'PeUnDtC$ Creatinine 0.44 - 1.00 mg/dL 1.69  1.90  1.84   Sodium 135 - 145 mmol/L 138  139  140   Potassium 3.5 - 5.1 mmol/L 3.8  3.9  4.1   Chloride 98 - 111 mmol/L 109  110  113   CO2 22 - 32 mmol/L $RemoveB'21  19  17   'XuEYcdZO$ Calcium 8.9 - 10.3 mg/dL 8.6  8.7  8.3      Imaging Reviewed:  Head CT: Negative for acute abnormalities  MRI spine: no acute abnormality or abnormal cord signal seen however DWI was not done and exam was very motion limited  Assessment: 61 year old patient with history of atrial fibrillation not on anticoagulation, hypertension, hyperlipidemia, smoking and recent repair of thoracic aortic aneurysm presented with bilateral leg weakness right greater than left.  Patient has improved with lumbar drain and increase of MAP to greater than 90 with strength in bilateral legs reaching near normal levels today.  Discussed at length with Dr. Carlis Abbott.  Overall low concern that the spinal drain is the source of her fevers given the time course as well as her clinical course.  If she begins to have signs concerning for meningitis, CSF lactate and meningitis/encephalitis panel may be useful to distinguish infection versus simple inflammation secondary to procedure.  However at this time I do not think these studies are needed.  Impression: Spinal ischemia inpatient status post thoracic aortic aneurysm repair.  Recommendations: -Appreciate vascular surgery management of spinal ischemia. -Management of comorbidities per primary team. -Neurology will follow as needed, thank you for involving [place] in care of this patient.  Please do not hesitate to reach out if new questions or concerns arise  [place] , MSN, AGACNP-BC [facility] See Amion for schedule and pager  information 01/19/2022 11:18 AM  Attending Neurologist's note:  I personally saw this patient, gathering history, performing a full neurologic examination, reviewing relevant labs, personally reviewing relevant imaging including MRI C spine and T-spine, and formulated the assessment and plan, adding the note above for completeness and clarity to accurately reflect my thoughts   Lesleigh Noe MD-PhD [facility] [phone] Available 7 AM to 7 PM, outside these hours please contact Neurologist on call listed on AMION

## 2022-01-20 DIAGNOSIS — N183 Chronic kidney disease, stage 3 unspecified: Secondary | ICD-10-CM

## 2022-01-20 DIAGNOSIS — N184 Chronic kidney disease, stage 4 (severe): Secondary | ICD-10-CM

## 2022-01-20 DIAGNOSIS — I712 Thoracic aortic aneurysm, without rupture, unspecified: Secondary | ICD-10-CM | POA: Diagnosis not present

## 2022-01-20 DIAGNOSIS — D72829 Elevated white blood cell count, unspecified: Secondary | ICD-10-CM | POA: Diagnosis not present

## 2022-01-20 DIAGNOSIS — R29898 Other symptoms and signs involving the musculoskeletal system: Secondary | ICD-10-CM | POA: Diagnosis not present

## 2022-01-20 LAB — CBC
HCT: 26.9 % — ABNORMAL LOW (ref 36.0–46.0)
Hemoglobin: 8.7 g/dL — ABNORMAL LOW (ref 12.0–15.0)
MCH: 32.5 pg (ref 26.0–34.0)
MCHC: 32.3 g/dL (ref 30.0–36.0)
MCV: 100.4 fL — ABNORMAL HIGH (ref 80.0–100.0)
Platelets: 180 10*3/uL (ref 150–400)
RBC: 2.68 MIL/uL — ABNORMAL LOW (ref 3.87–5.11)
RDW: 16.4 % — ABNORMAL HIGH (ref 11.5–15.5)
WBC: 25.7 10*3/uL — ABNORMAL HIGH (ref 4.0–10.5)
nRBC: 0.3 % — ABNORMAL HIGH (ref 0.0–0.2)

## 2022-01-20 LAB — BASIC METABOLIC PANEL
Anion gap: 10 (ref 5–15)
BUN: 14 mg/dL (ref 6–20)
CO2: 22 mmol/L (ref 22–32)
Calcium: 8.8 mg/dL — ABNORMAL LOW (ref 8.9–10.3)
Chloride: 106 mmol/L (ref 98–111)
Creatinine, Ser: 1.78 mg/dL — ABNORMAL HIGH (ref 0.44–1.00)
GFR, Estimated: 32 mL/min — ABNORMAL LOW (ref >60)
Glucose, Bld: 168 mg/dL — ABNORMAL HIGH (ref 70–99)
Potassium: 3.9 mmol/L (ref 3.5–5.1)
Sodium: 138 mmol/L (ref 135–145)

## 2022-01-20 LAB — MAGNESIUM: Magnesium: 2.1 mg/dL (ref 1.7–2.4)

## 2022-01-20 LAB — GLUCOSE, CAPILLARY
Glucose-Capillary: 150 mg/dL — ABNORMAL HIGH (ref 70–99)
Glucose-Capillary: 143 mg/dL — ABNORMAL HIGH (ref 70–99)
Glucose-Capillary: 123 mg/dL — ABNORMAL HIGH (ref 70–99)
Glucose-Capillary: 164 mg/dL — ABNORMAL HIGH (ref 70–99)

## 2022-01-20 NOTE — Addendum Note (Signed)
Addendum  created 01/20/22 1003 by Nolon Nations, MD   Clinical Note Signed

## 2022-01-20 NOTE — Progress Notes (Signed)
[place], MD instructed for lumbar drain to be clamped and remain throughout the day. Drain noted to be clamped at time of order. Pt has no concerns or complaints at this time.

## 2022-01-20 NOTE — Progress Notes (Signed)
NAME:  Dorothy Harris, MRN:  [id number], DOB:  1961-01-18, LOS: 3 ADMISSION DATE:  01/17/2022, CONSULTATION DATE:  01/17/2022 REFERRING MD:  Dr Myra Gianotti, CHIEF COMPLAINT:  post-TEVAR weakness  History of Present Illness:  61 yo female with known hypertension, hyperlipidemia, s/p endovascular repair of thoracic aortic aneurysm with coverage of the left subclavian artery and left leg thrombectomy on 10/11 admitted today with acute lower extremity weakness with suspected etiology spinal cord ischemia.     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  She follows with Vascular surgery as outpatient.  She developed aneurysmal changes to her descending aorta, measuring 6 mm in diameter and had TVAR planned for 10/11. POD 1, she was moving all extremities and beginning to mobilize, she was discharged home on 10/12.  Today, she presented to the ED at [facility] today with inability to walk at 0300, 7 hours prior to presentation to ED.  VS on presentation: Afebrile, HR: 80, RR 19, BP: 124/63, SpO2 93% on room air.  She received a bolus of IVF and transferred to [facility]   Pertinent  Medical History  AAA GERD HTN Paroxysmal Afib HLD CKD 4 Type I aortic dissection, s/p aortic arch reconstruction  Significant Hospital Events: Including procedures, antibiotic start and stop dates in addition to other pertinent events   Admit, bilateral lower extremity weakness R > L. Lumbar drain placed.  MRI C spine and T spine with no abnormal cord signal or abnormality otherwise.   Interim History / Subjective:  No acute events overnight. Remains of norepi for MAP goal 90. Lumbar drain clamped after output of overnight.   Objective   Blood pressure (!) 182/166, pulse 80, temperature 99.3 F (37.4 C), resp. rate 18, SpO2 98  %.        Intake/Output Summary (Last 24 hours) at 01/20/2022 0956 Last data filed at 01/20/2022 0800 Gross per 24 hour  Intake 2055.05 ml  Output 2710 ml  Net -654.95 ml    There were no vitals filed for this visit.  Examination:  General:  adult female resting comfortably in bed.  HENT: [place]/AT, PERRL, no JVD Lungs: Breathing comfortably. Clear. Deep breathing induces cough. Cardiovascular: RRR, no MRG Abdomen: Soft, NT, ND. RUQ ileostomy Extremities: Warm. Movement and sensation intact Neuro:Alert, oriented, non-focal. Chronic stutter.   Bicarb 22 BUN 14 Cr 1.78 WBC 25.7 H/Hh 8.7/26.9 Platelets 180  Blood cultures> NGTD Sputum Cx > pending   Resolved Hospital Problem list     Assessment & Plan:   Bilateral lower extremity weakness, concern for spinal cord ischemia s/p TEVAR.  No  infarction on MRI. - maintain adequate perfusion pressure. Needs MAP >90, using norepi to maintain MAP.  - continue lumbar drain per vascular surgery > clamping trial today - frequent neurovascular checks - maintain Hb >8 - appreciate neurology's assistance, who have signed off  Ongoing fevers, worsening leukocytosis - Cefepime, vancomycin, azithromycin.  - follow blood cultures, urine culture  CKD, stage IIIb - continue PTA bicarb  - strict I/O - renally dose meds, avoid nephrotoxic meds - monitor  H/o hypertension - con't holding PTA antihypertensives; goal MAP >90  Afib, paroxysmal-- had been isolated to a hospitalization -Amiodarone resumed (reports she was still taking it, but hasn't had it filled recently per pharmacy records) Previously had Afib during sepsis, so will keep while  she remains on norepi then stop. - not needing AC per chart review - tele monitoring  HLD - con't statin  Anemia- stable - goal Hb >8 for spinal perfusion  Thrombocytopenia-improving. - monitor platelets  Chronic ileostomy - routine ostomy care  Hyperglycemia, controlled - SSI  PRN- hasn't needed insulin  Former tobacco abuse - quit in 2022; not sure how many pack years, but may need referral for lung cancer screening at discharge  Best Practice (right click and "Reselect all SmartList Selections" daily)   Diet/type: Regular consistency (see orders) DVT prophylaxis: SCD GI prophylaxis: PPI (home omeprazole) Lines: Central line Foley:  Yes, and it is still needed Code Status:  full code Last date of multidisciplinary goals of care discussion [Sister at bedside]  Labs   CBC: Recent Labs  Lab 01/16/22 0408 01/17/22 1105 01/17/22 1106 01/18/22 0031 01/19/22 0500 01/20/22 0549  WBC 18.4*  --  19.1* 19.9* 26.4* 25.7*  NEUTROABS  --   --  14.7*  --   --   --   HGB 7.4* 7.5* 7.0* 8.6* 8.8* 8.7*  HCT 23.5* 22.0* 22.3* 26.8* 26.1* 26.9*  MCV 106.3*  --  106.7* 101.9* 98.9 100.4*  PLT 149*  --  116* 107* 135* 180     Basic Metabolic Panel: Recent Labs  Lab 01/15/22 1300 01/16/22 0408 01/17/22 1106 01/18/22 0032 01/18/22 0500 01/19/22 0500 01/20/22 0549  NA 137   < > 140 140 139 138 138  K 5.2*   < > 4.4 4.1 3.9 3.8 3.9  CL 114*   < > 113* 113* 110 109 106  CO2 16*   < > 20* 17* 19* 21* 22  GLUCOSE 162*   < > 131* 129* 137* 157* 168*  BUN 29*   < > 23* $Rem'18 16 11 14  'wdfX$ CREATININE 2.34*   < > 2.06* 1.84* 1.90* 1.69* 1.78*  CALCIUM 8.2*   < > 8.6* 8.3* 8.7* 8.6* 8.8*  MG 2.0  --   --   --  2.0  --  2.1   < > = values in this interval not displayed.    GFR: Estimated Creatinine Clearance: 37.4 mL/min (A) (by C-G formula based on SCr of 1.78 mg/dL (H)). Recent Labs  Lab 01/17/22 1106 01/18/22 0031 01/19/22 0500 01/20/22 0549  WBC 19.1* 19.9* 26.4* 25.7*     Critical care time 36 minutes  Georgann Housekeeper, AGACNP-BC [facility]  See Amion for personal pager PCCM on call pager [phone] until 7pm. Please call [place] 7p-7a. [phone]  01/20/2022 9:58 AM

## 2022-01-20 NOTE — Anesthesia Post-op Follow-up Note (Signed)
  Anesthesia Lumbar drain Follow-up Note  Patient: Dorothy Harris  Day #: 4 (Placed 01/17/2022)  Date of Follow-up: 01/20/2022 Time: 9:54 AM  Last Vitals:  Vitals:   01/20/22 0815 01/20/22 0830  BP:    Pulse: 74 80  Resp: 12 18  Temp: 37.4 C 37.4 C  SpO2: 99% 98%    Level of Consciousness: alert   Catheter Site Exam:dry, no drainage, insertion site covered by tape, but it is clean and dry   Plan: Lumbar drain intermittently functioning. Apparently last night ~190ml CSF was drained. Currently clamped. Per RN at bedside plan is clamping trial today and catheter removal tomorrow. Will need CBC/PT/INR in the AM  01/21/2022 if catheter is to be removed.  Nolon Nations

## 2022-01-20 NOTE — Progress Notes (Signed)
Pharmacy Antibiotic Note  Dorothy Harris is a 61 y.o. female admitted on 01/17/2022 with sepsis, fever.  Pharmacy has been consulted for Vancomycin and Cefepime dosing. S/p lumbar drain placement 10/13 for BLE weakness s/p TVAR, complicated by left leg clotting and required thrombectomy.  On day#6 of antibiotics. SCr 1.78. Tm 100.2. Azithromycin added recently with ongoing fevers and worsening leukocytosis.   Plan: Cefepime 2gm IV q12h Vancomycin 1250 mg IV Q 36 hrs. Goal AUC 400-550. Expected AUC: 497 SCr used: 2.06 Will f/u renal function, micro data, and pt's clinical condition Vanc levels prn     Temp (24hrs), Avg:99.5 F (37.5 C), Min:99.1 F (37.3 C), Max:100.2 F (37.9 C)  Recent Labs  Lab 01/16/22 0408 01/17/22 1105 01/17/22 1106 01/18/22 0031 01/18/22 0032 01/18/22 0500 01/19/22 0500 01/20/22 0549  WBC 18.4*  --  19.1* 19.9*  --   --  26.4* 25.7*  CREATININE 2.44*   < > 2.06*  --  1.84* 1.90* 1.69* 1.78*   < > = values in this interval not displayed.     Estimated Creatinine Clearance: 37.4 mL/min (A) (by C-G formula based on SCr of 1.78 mg/dL (H)).    Allergies  Allergen Reactions   Chlorthalidone Other (See Comments)    ELEVATED KIDNEY FUNCTION     Antimicrobials this admission: Vanc 10/13>> Cefepime 10/13>> Azithromycin 10/13>>   Microbiology results:  Bcx 10/13: ngtd Sputum cx 10/15: pending  Thank you for allowing pharmacy to be a part of this patient's care.  Antonietta Jewel, PharmD, [place] Clinical Pharmacist  Phone: [phone] 01/20/2022 2:36 PM  Please check AMION for all [place] phone numbers After 10:00 PM, call [place] [phone]

## 2022-01-20 NOTE — Consult Note (Signed)
[facility] Nurse ostomy consult note Stoma type/location: RLQ ileostomy Stomal assessment/size: 1 and 1/2 inches at last measurement Peristomal assessment: Not seen today Treatment options for stomal/peristomal skin: Skin barrier ring Ostomy pouching: 2pc. 2 and 1/4 inch ostomy pouching system with skin barrier ring (Pouch is Lawson # [phone], Skin barrier is Kellie Simmering # [phone] and skin barrier ring Patient is independent in her ostomy care when she is well. She will need the support of Bedside RNs if she is not able to care for herself. Enrolled patient in [place] program: Yes, previously  [place] nursing team will not follow, but will remain available to this patient, the nursing and medical teams.  Please re-consult if needed.  Thank you for inviting [place] to participate in this patient's Plan of Care.  Maudie Flakes, MSN, RN, CNS, [place], Serita Grammes, [employer], [employer] phone:  [phone]

## 2022-01-20 NOTE — Progress Notes (Addendum)
Progress Note    01/20/2022 6:42 AM Hospital Day 3  Subjective:  says her legs are feeling better but still weak.    RN reports that when they rolled pt, the drain started working again.  ~30cc every 2 hours removed.    Tm 100.2 now 99.1 HR 60's-80's 585'I-778'E systolic 42% [id number]  Gtts: Norepi 6mcg/min  Abx: Cefepime Vancomycin Azithromycin  Vitals:   01/20/22 0545 01/20/22 0600  BP:  (!) 173/68  Pulse: 70 72  Resp: 12 12  Temp: 99.1 F (37.3 C) 99.1 F (37.3 C)  SpO2: 99% 99%    Physical Exam: General:  no distress Lungs:  non labored Extremities:  easily palpable bilateral radial pulses and PT pulses.  Bilateral feet are warm.  Sensory are in tact and able to lift both feet off of the bed.    CBC    Component Value Date/Time   WBC 25.7 (H) 01/20/2022 0549   RBC 2.68 (L) 01/20/2022 0549   HGB 8.7 (L) 01/20/2022 0549   HCT 26.9 (L) 01/20/2022 0549   PLT 180 01/20/2022 0549   MCV 100.4 (H) 01/20/2022 0549   MCH 32.5 01/20/2022 0549   MCHC 32.3 01/20/2022 0549   RDW 16.4 (H) 01/20/2022 0549   LYMPHSABS 1.5 01/17/2022 1106   MONOABS 2.6 (H) 01/17/2022 1106   EOSABS 0.0 01/17/2022 1106   BASOSABS 0.0 01/17/2022 1106    BMET    Component Value Date/Time   NA 138 01/20/2022 0549   NA 139 12/23/2021 0000   K 3.9 01/20/2022 0549   CL 106 01/20/2022 0549   CO2 22 01/20/2022 0549   GLUCOSE 168 (H) 01/20/2022 0549   BUN 14 01/20/2022 0549   BUN 24 (A) 12/23/2021 0000   CREATININE 1.78 (H) 01/20/2022 0549   CREATININE 1.69 (H) 08/21/2017 0935   CALCIUM 8.8 (L) 01/20/2022 0549   GFRNONAA 32 (L) 01/20/2022 0549   GFRNONAA 33 (L) 08/21/2017 0935   GFRAA 53 (L) 10/07/2017 0720   GFRAA 39 (L) 08/21/2017 0935    INR    Component Value Date/Time   INR 1.3 (H) 01/18/2022 0500     Intake/Output Summary (Last 24 hours) at 01/20/2022 0642 Last data filed at 01/20/2022 0600 Gross per 24 hour  Intake 1963 ml  Output 2570 ml  Net -607 ml      Assessment/Plan:  61 y.o. female  status post repair of thoracic aortic aneurysm on 01/15/2022.  She was discharged home with no lower extremity weakness.  She was readmitted Friday with bilateral lower extremity weakness with concern for spinal cord ischemia in setting of covered stent graft.   Hospital Day 3  -pt with palpable radial pulses and PT pulses bilaterally.  She is able to lift both feet off the bed and sensory is in tact.  -spinal drain had stopped working over weekend.  RN reports that when the pt was rolled, it started working and ~30cc was drained every 2 hours. -MRI over weekend revealed no evidence of spinal cord ischemia -leukocytosis with low grade fever.  Resp cx in process.  Blood cx no growth x 2 days. Urine cx with no growth.   Pt on Vanc, azithromycin and cefepime.  -CKD with creatinine at 1.78-appears stable   Leontine Locket, PA-C [facility] [phone] 01/20/2022 6:42 AM   I have seen and evaluated the patient. I agree with the PA note as documented above.  There was concern overnight that her spinal drain was not working.  Appreciate anesthesiology evaluating her drain after they flushed it it is working again.  She has an improved neuro exam now 4+ out of 5 in bilateral lower extremities and can lift her legs off the bed and bend her knees.  Discussed plan to clamp spinal drain today but leave it in place.  She remains on 19 mcg of Levophed for MAP pushes greater than 90.  Had persistent fevers over the weekend but not toxic appearing.  Remains on broad-spectrum antibiotics.  No growth on cultures.  Brisk PT signals in both feet.  Marty Heck, MD [facility] Office: [phone]

## 2022-01-21 DIAGNOSIS — I712 Thoracic aortic aneurysm, without rupture, unspecified: Secondary | ICD-10-CM | POA: Diagnosis not present

## 2022-01-21 LAB — CBC
HCT: 25.3 % — ABNORMAL LOW (ref 36.0–46.0)
Hemoglobin: 8.3 g/dL — ABNORMAL LOW (ref 12.0–15.0)
MCH: 32.8 pg (ref 26.0–34.0)
MCHC: 32.8 g/dL (ref 30.0–36.0)
MCV: 100 fL (ref 80.0–100.0)
Platelets: 188 10*3/uL (ref 150–400)
RBC: 2.53 MIL/uL — ABNORMAL LOW (ref 3.87–5.11)
RDW: 16.5 % — ABNORMAL HIGH (ref 11.5–15.5)
WBC: 23.5 10*3/uL — ABNORMAL HIGH (ref 4.0–10.5)
nRBC: 0.3 % — ABNORMAL HIGH (ref 0.0–0.2)

## 2022-01-21 LAB — BASIC METABOLIC PANEL
Anion gap: 11 (ref 5–15)
BUN: 18 mg/dL (ref 6–20)
CO2: 24 mmol/L (ref 22–32)
Calcium: 8.8 mg/dL — ABNORMAL LOW (ref 8.9–10.3)
Chloride: 107 mmol/L (ref 98–111)
Creatinine, Ser: 1.93 mg/dL — ABNORMAL HIGH (ref 0.44–1.00)
GFR, Estimated: 29 mL/min — ABNORMAL LOW (ref >60)
Glucose, Bld: 154 mg/dL — ABNORMAL HIGH (ref 70–99)
Potassium: 4 mmol/L (ref 3.5–5.1)
Sodium: 142 mmol/L (ref 135–145)

## 2022-01-21 LAB — GLUCOSE, CAPILLARY
Glucose-Capillary: 135 mg/dL — ABNORMAL HIGH (ref 70–99)
Glucose-Capillary: 147 mg/dL — ABNORMAL HIGH (ref 70–99)
Glucose-Capillary: 119 mg/dL — ABNORMAL HIGH (ref 70–99)
Glucose-Capillary: 171 mg/dL — ABNORMAL HIGH (ref 70–99)

## 2022-01-21 LAB — URIC ACID: Uric Acid, Serum: 6.6 mg/dL (ref 2.5–7.1)

## 2022-01-21 LAB — PROTIME-INR
INR: 1.2 (ref 0.8–1.2)
Prothrombin Time: 14.9 seconds (ref 11.4–15.2)

## 2022-01-21 MED ORDER — MELATONIN 3 MG PO TABS
3.0000 mg | ORAL_TABLET | Freq: Once | ORAL | Status: AC
  Administered 2022-01-21: 3 mg via ORAL
  Filled 2022-01-21: qty 1

## 2022-01-21 MED ORDER — PREDNISONE 20 MG PO TABS: 40.0000 mg | ORAL_TABLET | Freq: Every day | ORAL | Status: DC

## 2022-01-21 MED ORDER — PREDNISONE 20 MG PO TABS
40.0000 mg | ORAL_TABLET | Freq: Every day | ORAL | Status: AC
  Administered 2022-01-21 – 2022-01-23 (×3): 40 mg via ORAL
  Filled 2022-01-21 (×3): qty 2

## 2022-01-21 MED ORDER — HEPARIN SODIUM (PORCINE) 5000 UNIT/ML IJ SOLN
5000.0000 [IU] | Freq: Three times a day (TID) | INTRAMUSCULAR | Status: DC
  Administered 2022-01-21 – 2022-01-29 (×23): 5000 [IU] via SUBCUTANEOUS
  Filled 2022-01-21 (×23): qty 1

## 2022-01-21 NOTE — Progress Notes (Signed)
[facility] Progress Note Patient Name: Dorothy Harris DOB: Feb 15, 1961 MRN: [id number]   Date of Service  01/21/2022  HPI/Events of Note  Patient requests sleep aid.   eICU Interventions  Plan: Melatonin 3 mg PO X 1.      Intervention Category Major Interventions: Other:  [name] 01/21/2022, 8:25 PM

## 2022-01-21 NOTE — Progress Notes (Signed)
Last neuro and neurovascular assessment s/p lumbar drain removal: Patient neuro and neurovascular assessment remains unchanged and WDL. Aox4, no headache, sensation intact, able to lift legs and knees up off bed, pulses palpable on BLE. Lumbar drain dressing CDI.

## 2022-01-21 NOTE — Addendum Note (Signed)
Addendum  created 01/21/22 0950 by Suzette Battiest, MD   Clinical Note Signed

## 2022-01-21 NOTE — Progress Notes (Addendum)
Progress Note    01/21/2022 6:49 AM   Subjective:  says she's having some pain across the top of the left foot.  Says she has had it in the past and was told it was gout.    Tm 100.2 now afebrile HR 70's NSR 528'U-132'G systolic 401% [id number]  Gtts:  Norepi 57mcg/min  Vitals:   01/21/22 0630 01/21/22 0645  BP:    Pulse: 65 71  Resp: 10 12  Temp: 98.6 F (37 C) 98.6 F (37 C)  SpO2: 100% 100%    Physical Exam: Cardiac:  regular Lungs:  non labored Incisions:  left groin incision is clean and dry Extremities:  palpable bilateral PT pulses and right DP pulse.  Left DP pulse with good doppler flow Abdomen:  soft  CBC    Component Value Date/Time   WBC 23.5 (H) 01/21/2022 0347   RBC 2.53 (L) 01/21/2022 0347   HGB 8.3 (L) 01/21/2022 0347   HCT 25.3 (L) 01/21/2022 0347   PLT 188 01/21/2022 0347   MCV 100.0 01/21/2022 0347   MCH 32.8 01/21/2022 0347   MCHC 32.8 01/21/2022 0347   RDW 16.5 (H) 01/21/2022 0347   LYMPHSABS 1.5 01/17/2022 1106   MONOABS 2.6 (H) 01/17/2022 1106   EOSABS 0.0 01/17/2022 1106   BASOSABS 0.0 01/17/2022 1106    BMET    Component Value Date/Time   NA 142 01/21/2022 0347   NA 139 12/23/2021 0000   K 4.0 01/21/2022 0347   CL 107 01/21/2022 0347   CO2 24 01/21/2022 0347   GLUCOSE 154 (H) 01/21/2022 0347   BUN 18 01/21/2022 0347   BUN 24 (A) 12/23/2021 0000   CREATININE 1.93 (H) 01/21/2022 0347   CREATININE 1.69 (H) 08/21/2017 0935   CALCIUM 8.8 (L) 01/21/2022 0347   GFRNONAA 29 (L) 01/21/2022 0347   GFRNONAA 33 (L) 08/21/2017 0935   GFRAA 53 (L) 10/07/2017 0720   GFRAA 39 (L) 08/21/2017 0935    INR    Component Value Date/Time   INR 1.2 01/21/2022 0347     Intake/Output Summary (Last 24 hours) at 01/21/2022 0649 Last data filed at 01/21/2022 0600 Gross per 24 hour  Intake 2122.46 ml  Output 3020 ml  Net -897.54 ml      Component 2 d ago  Specimen Description EXPSU  Special Requests NONE Reflexed from [id number]  Gram  Stain RARE SQUAMOUS EPITHELIAL CELLS PRESENT RARE WBC PRESENT,BOTH PMN AND MONONUCLEAR RARE GRAM POSITIVE COCCI IN PAIRS Performed at [facility], [address] [place], [place] [address]  Culture PENDING  Report Status PENDING     Blood cx NGTD   Assessment/Plan:  61 y.o. female is s/p:  repair of thoracic aortic aneurysm on 01/15/2022.  She was discharged home with no lower extremity weakness.  She was readmitted Friday with bilateral lower extremity weakness with concern for spinal cord ischemia in setting of covered stent graft.        -Levophed down to 3mcg/min and now off  -drain has been clamped and most likely will be removed today -pt with palpable PT pulses and radial pulses bilaterally.  She is able to lift both legs off the bed and sensory is in tact.   Leukocytosis with WBC 23k (down frm 26k a couple of days ago) resp cx gram stain with rare GPC in pairs; left groin incision looks great-awaiting final culture.  continue abx. -DVT prophylaxis:  SCD's -keep dry gauze to left groin to wick moisture to  help prevent wound infection.   Leontine Locket, PA-C [facility] [phone] 01/21/2022 6:49 AM   I agree with the above.  Legs are significantly stronger. No change with spinal drain being capped and pressors off -spoke with anesthesia who will remove the spinal drain -will start sq heparin 12 hours after drain out for DVT prophylaxis -continue abx for pneumonia -check uric acid level.  CCM starting steroids for presumed gout  [place]s Dorothy Harris

## 2022-01-21 NOTE — Progress Notes (Addendum)
NAME:  Dorothy Harris, MRN:  [id number], DOB:  [birth year], LOS: 4 ADMISSION DATE:  01/17/2022, CONSULTATION DATE:  01/17/2022 REFERRING MD:  Dr Myra Gianotti, CHIEF COMPLAINT:  post-TEVAR weakness  History of Present Illness:  61 yo female with known hypertension, hyperlipidemia, s/p endovascular repair of thoracic aortic aneurysm with coverage of the left subclavian artery and left leg thrombectomy on 10/11 admitted today with acute lower extremity weakness with suspected etiology spinal cord ischemia.     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  She follows with Vascular surgery as outpatient.  She developed aneurysmal changes to her descending aorta, measuring 6 mm in diameter and had TVAR planned for 10/11. POD 1, she was moving all extremities and beginning to mobilize, she was discharged home on 10/12.  Today, she presented to the ED at [facility] today with inability to walk at 0300, 7 hours prior to presentation to ED.  VS on presentation: Afebrile, HR: 80, RR 19, BP: 124/63, SpO2 93% on room air.  She received a bolus of IVF and transferred to [facility].   Pertinent  Medical History  AAA GERD HTN Paroxysmal Afib HLD CKD 4 Type I aortic dissection, s/p aortic arch reconstruction  Significant Hospital Events: Including procedures, antibiotic start and stop dates in addition to other pertinent events   Admit, bilateral lower extremity weakness R > L. Lumbar drain placed.  MRI C spine and T spine with no abnormal cord signal or abnormality otherwise.  10/16 drain clamped 10/17 off pressors c/o left foot pain c/w prior gout complaints  Interim History / Subjective:  C/o left foot pain   Objective   Blood pressure (Abnormal) 161/74, pulse 63, temperature 98.6 F (37 C), resp. rate (Abnormal) 8,  height 5\' 7"  (1.702 m), weight 83.5 kg, SpO2 100 %.        Intake/Output Summary (Last 24 hours) at 01/21/2022 0730 Last data filed at 01/21/2022 0700 Gross per 24 hour  Intake 2138.54 ml  Output 3020 ml  Net -881.46 ml   Filed Weights   01/20/22 2000  Weight: 83.5 kg    Examination:   General 61 year old female resting in bed no acute distress HENT NCAT no JVD  Pulm clear  Card rrr currently NSR Abd soft  Ext warm and dry  Neuro intact. Strength 4/5 equal LEs. Has LLE pain over top of foot. Similar to prior gout Resolved Hospital Problem list   Thrombocytopenia ->normalized as of 10/16 Assessment & Plan:   Bilateral lower extremity weakness, concern for spinal cord ischemia s/p TEVAR.  No [place] infarction on MRI. Plan MAP goal > 90 to ensure spinal perfusion Freq neuro checks Hgb goal > 8 Neurovascular checks  Drain rx per vasculat  Ongoing fevers & leukocytosis Temp trend down for last 24hrs; wbc still elevated. BC - to date. Resp culture (P) Plan Day 3 of 5 azith D 5/x cefepime D 5/x vanc Will stop abx 48hrs after drain removal  CKD, stage IIIb Scr w/in baseline Plan Avoid nephro toxins Cont PTA bicarb Renal dose meds Avoid volume depletion  Am chem  H/o hypertension Plan Holding meds d/t hypotension   Afib, paroxysmal-->currently NSR...had been 1 isolated to a hospitalization during sepsis.  CHA2DS2-VASc Score = 4  Plan Cont tele  Cont amio while on Norepi then stop If she were to  have recurrent AF I think we should give strong consideration to systemic AC   HLD Plan Statin  Anemia- stable - goal Hb >8 for spinal perfusion Plan Trend cbc Transfuse as indicated    Chronic ileostomy Plan Routine ostomy care   Hyperglycemia, controlled Plan Ssi, goal < 180   Former tobacco abuse - quit in 2022; not sure how many pack years Plan Consider lung cancer screening consult at dc   Left foot pain w/ h/o hyperuricemia and excellent  response to pred in past Plan Have messaged vasc If no contraindication from vasc standpoint will give pred 40 x 3d  Best Practice (right click and "Reselect all SmartList Selections" daily)   Diet/type: Regular consistency (see orders) DVT prophylaxis: SCD GI prophylaxis: PPI (home omeprazole) Lines: Central line Foley:  Yes, and it is still needed Code Status:  full code Last date of multidisciplinary goals of care discussion [Sister at bedside]  Erick Colace ACNP-BC [facility] Pager # [phone] OR # [phone] if no answer     Critical care time   01/21/2022 7:30 AM

## 2022-01-21 NOTE — Anesthesia Post-op Follow-up Note (Signed)
  Anesthesia Pain Follow-up Note  Patient: Dorothy Harris  Day #: 5  Date of Follow-up: 01/21/2022 Time: 9:48 AM  Last Vitals:  Vitals:   01/21/22 0700 01/21/22 0800  BP: (!) 153/73 (!) 151/73  Pulse: 63 75  Resp: (!) 8 12  Temp: 37 C 37 C  SpO2: 100% 99%    Level of Consciousness: alert  Pain: none   Side Effects:None  Catheter Site Exam:clean, dry, no drainage     Plan: Catheter removed/tip intact at surgeon's request and D/C from anesthesia care at surgeon's request Coags and platelets normal prior to lumbar drain removal. Anticoagulation to start 12hrs post removal. Q2 neuro checks x 6 hours discussed with ICU nurse. Will sign off.  Tiajuana Amass

## 2022-01-21 NOTE — Progress Notes (Addendum)
Patient turned to look at old lumbar drain site post removal.  Dressing soaked with serous fluid as well as bed pad is wet underneath through to sheet.  Dr. Hipolito Bayley notified of this and the fact that the patient has a slight headache.  Told to lie patient flat at this time. Will continue to monitor.   1230 Dr. Lynetta Mare notified as well of above and that BP is lower with MAP of 66 by aline and BP of 106/57 and by cuff  BP is 115/53 with MAP of 73.  No new orders at this time.

## 2022-01-22 DIAGNOSIS — I712 Thoracic aortic aneurysm, without rupture, unspecified: Secondary | ICD-10-CM | POA: Diagnosis not present

## 2022-01-22 LAB — CULTURE, RESPIRATORY W GRAM STAIN: Culture: NORMAL

## 2022-01-22 LAB — BASIC METABOLIC PANEL
Anion gap: 10 (ref 5–15)
BUN: 27 mg/dL — ABNORMAL HIGH (ref 6–20)
CO2: 24 mmol/L (ref 22–32)
Calcium: 9.2 mg/dL (ref 8.9–10.3)
Chloride: 106 mmol/L (ref 98–111)
Creatinine, Ser: 2.14 mg/dL — ABNORMAL HIGH (ref 0.44–1.00)
GFR, Estimated: 26 mL/min — ABNORMAL LOW (ref >60)
Glucose, Bld: 137 mg/dL — ABNORMAL HIGH (ref 70–99)
Potassium: 4.1 mmol/L (ref 3.5–5.1)
Sodium: 140 mmol/L (ref 135–145)

## 2022-01-22 LAB — GLUCOSE, CAPILLARY
Glucose-Capillary: 151 mg/dL — ABNORMAL HIGH (ref 70–99)
Glucose-Capillary: 165 mg/dL — ABNORMAL HIGH (ref 70–99)
Glucose-Capillary: 206 mg/dL — ABNORMAL HIGH (ref 70–99)
Glucose-Capillary: 146 mg/dL — ABNORMAL HIGH (ref 70–99)

## 2022-01-22 LAB — EXPECTORATED SPUTUM ASSESSMENT W GRAM STAIN, RFLX TO RESP C

## 2022-01-22 LAB — CULTURE, BLOOD (ROUTINE X 2)
Culture: NO GROWTH
Culture: NO GROWTH
Special Requests: ADEQUATE
Special Requests: ADEQUATE

## 2022-01-22 MED ORDER — LIDOCAINE-EPINEPHRINE 1 %-1:100000 IJ SOLN
10.0000 mL | Freq: Once | INTRAMUSCULAR | Status: DC
  Filled 2022-01-22: qty 10

## 2022-01-22 NOTE — Progress Notes (Signed)
  Progress Note    01/22/2022 8:12 AM  Subjective:  says she developed a headache this morning;  she describes it as mild and she says it is getting better.  Says her left foot feels better.  RN reports CSF leak.  Tm 99.4 now afebrile HR 60's-70's 315'V-761'Y systolic 07% [id number]  Off pressors  Vitals:   01/22/22 0700 01/22/22 0746  BP: (!) 135/102   Pulse: 78 82  Resp: 20 20  Temp:    SpO2: 98% 97%    Physical Exam: Cardiac:  regular Lungs:  non labored Incisions:  left groin incision is clean and dry Extremities:  palpable right DP/PT and brisk multiphasic left DP/PT Abdomen:  soft  CBC    Component Value Date/Time   WBC 23.5 (H) 01/21/2022 0347   RBC 2.53 (L) 01/21/2022 0347   HGB 8.3 (L) 01/21/2022 0347   HCT 25.3 (L) 01/21/2022 0347   PLT 188 01/21/2022 0347   MCV 100.0 01/21/2022 0347   MCH 32.8 01/21/2022 0347   MCHC 32.8 01/21/2022 0347   RDW 16.5 (H) 01/21/2022 0347   LYMPHSABS 1.5 01/17/2022 1106   MONOABS 2.6 (H) 01/17/2022 1106   EOSABS 0.0 01/17/2022 1106   BASOSABS 0.0 01/17/2022 1106    BMET    Component Value Date/Time   NA 140 01/22/2022 0341   NA 139 12/23/2021 0000   K 4.1 01/22/2022 0341   CL 106 01/22/2022 0341   CO2 24 01/22/2022 0341   GLUCOSE 137 (H) 01/22/2022 0341   BUN 27 (H) 01/22/2022 0341   BUN 24 (A) 12/23/2021 0000   CREATININE 2.14 (H) 01/22/2022 0341   CREATININE 1.69 (H) 08/21/2017 0935   CALCIUM 9.2 01/22/2022 0341   GFRNONAA 26 (L) 01/22/2022 0341   GFRNONAA 33 (L) 08/21/2017 0935   GFRAA 53 (L) 10/07/2017 0720   GFRAA 39 (L) 08/21/2017 0935    INR    Component Value Date/Time   INR 1.2 01/21/2022 0347     Intake/Output Summary (Last 24 hours) at 01/22/2022 0812 Last data filed at 01/22/2022 0700 Gross per 24 hour  Intake 3081.47 ml  Output 1375 ml  Net 1706.47 ml     Assessment/Plan:  61 y.o. female is s/p:  repair of thoracic aortic aneurysm on 01/15/2022.  She was discharged home with no  lower extremity weakness.  She was readmitted Friday with bilateral lower extremity weakness with concern for spinal cord ischemia in setting of covered stent graft.     -pressors are off and pt still able to move legs.  Drain removed yesterday.  She has headache this am and still with CSF drainage.  Discussed with Dr. Trula Slade, headache common after drain removal. Will have her lay flat and change bandage on back as needed.   -left foot is subjectively better.   -creatinine at 2.1, however, about a month ago, she was 2.5.   -continue abx for pna. -DVT prophylaxis:  sq heparin started last evening -continue dry dressing to left groin to wick moisture.  Does not need to be taped.  Replace q shift and as needed.   Leontine Locket, PA-C [facility] [phone] 01/22/2022 8:12 AM

## 2022-01-22 NOTE — Progress Notes (Signed)
NAME:  Dorothy Harris, MRN:  [id number], DOB:  02/01/1961, LOS: 5 ADMISSION DATE:  01/17/2022, CONSULTATION DATE:  01/17/2022 REFERRING MD:  Dr Myra Gianotti, CHIEF COMPLAINT:  post-TEVAR weakness  History of Present Illness:  61 yo female with known hypertension, hyperlipidemia, s/p endovascular repair of thoracic aortic aneurysm with coverage of the left subclavian artery and left leg thrombectomy on 10/11 admitted today with acute lower extremity weakness with suspected etiology spinal cord ischemia.     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  She follows with Vascular surgery as outpatient.  She developed aneurysmal changes to her descending aorta, measuring 6 mm in diameter and had TVAR planned for 10/11. POD 1, she was moving all extremities and beginning to mobilize, she was discharged home on 10/12.  Today, she presented to the ED at [facility] today with inability to walk at 0300, 7 hours prior to presentation to ED.  VS on presentation: Afebrile, HR: 80, RR 19, BP: 124/63, SpO2 93% on room air.  She received a bolus of IVF and transferred to [facility].   Pertinent  Medical History  AAA GERD HTN Paroxysmal Afib HLD CKD 4 Type I aortic dissection, s/p aortic arch reconstruction  Significant Hospital Events: Including procedures, antibiotic start and stop dates in addition to other pertinent events   Admit, bilateral lower extremity weakness R > L. Lumbar drain placed.  MRI C spine and T spine with no abnormal cord signal or abnormality otherwise.  10/16 drain clamped 10/17 off pressors c/o left foot pain c/w prior gout complaints 10/18 left foot pain improving with prednisone for gout.  Persistent headache with CSF leak.  Interim History / Subjective:  Left foot pain is improving.  However  patient continues to have CSF leakage saturating her dressings.  She complains of a headache.  Leg strength unchanged.  Objective   Blood pressure 139/63, pulse 71, temperature 98.7 F (37.1 C), temperature source Oral, resp. rate 15, height 5\' 7"  (1.702 m), weight 83.5 kg, SpO2 97 %.        Intake/Output Summary (Last 24 hours) at 01/22/2022 1424 Last data filed at 01/22/2022 1056 Gross per 24 hour  Intake 2860.25 ml  Output 1275 ml  Net 1585.25 ml    Filed Weights   01/20/22 2000  Weight: 83.5 kg    Examination:   General 61 year old female resting in bed no acute distress HENT NCAT no JVD  Pulm clear  Card rrr currently NSR Abd soft  Ext warm and dry  Neuro intact. Strength 4/5 equal LEs. Has LLE pain over top of foot. Similar to prior gout   Resolved Hospital Problem list   Creatinine has risen to 2.  1 4 WBC remains elevated to 23.5. Assessment & Plan:   Bilateral lower extremity weakness, concern for spinal cord ischemia s/p TEVAR.  No [place] infarction on MRI. Plan Allow blood pressure autoregulation. PT OT Freq neuro checks Hgb goal > 8 Neurovascular checks  Bedrest.  Will stitch drain hole closed to attempt to stop CSF leak.  (Per neurosurgery suggestion). Can transfer once CSF leak resolved.  Ongoing fevers & leukocytosis appears to be coming down. Temp trend down for last 24hrs; wbc still elevated. BC - to date. Resp culture (P) Plan Day 3 of 5 azith D 5/x cefepime D 5/x vanc Will stop  abx 48hrs after drain removal  CKD, stage IIIb Scr w/in baseline Plan Avoid nephro toxins Cont PTA bicarb Renal dose meds Avoid volume depletion  Am chem  H/o hypertension Plan Holding meds d/t hypotension  Afib, paroxysmal-->currently NSR...had been 1 isolated to a hospitalization during sepsis.  CHA2DS2-VASc Score = 4  Plan Cont tele  Cont amio while on Norepi then stop If she were to have recurrent AF I think we should give strong consideration to  systemic AC   HLD Plan Statin  Anemia- stable - goal Hb >8 for spinal perfusion Plan Trend cbc Transfuse as indicated    Chronic ileostomy Plan Routine ostomy care   Hyperglycemia, controlled Plan Ssi, goal < 180   Former tobacco abuse - quit in 2022; not sure how many pack years Plan Consider lung cancer screening consult at dc   Left foot pain w/ h/o hyperuricemia and excellent response to pred in past Plan Methylprednisolone 40 mg daily for 5 days.  Best Practice (right click and "Reselect all SmartList Selections" daily)   Diet/type: Regular consistency (see orders) DVT prophylaxis: SCD GI prophylaxis: PPI (home omeprazole) Lines: Central line Foley:  Yes, and it is still needed Code Status:  full code Last date of multidisciplinary goals of care discussion [Sister at bedside]  Kipp Brood, MD [facility] ICU Physician [place]  Pager: [phone] Or Epic Secure Chat After hours: [phone].  01/22/2022, 2:28 PM

## 2022-01-22 NOTE — Progress Notes (Signed)
Patient with CSF leakage from prior lumbar drain site. Neuro surgery recommended skin approximation/suture placement to help resolve drainage. I placed a figure 8 suture. Currently no drainage coming from site post suture placement. Will continue to monitor for drainage. Consider removing suture in 3-5 days pending cessation of drainage.  JD Rexene Agent [facility] 01/22/2022, 2:39 PM  Please see [email or web address] for pager details.  From 7A-7P if no response, please call [phone]. After hours, please call ELink [phone].

## 2022-01-22 NOTE — TOC Initial Note (Signed)
Transition of Care [facility]) - Initial/Assessment Note    Patient Details  Name: Dorothy Harris MRN: [id number] Date of Birth: [birth year]  Transition of Care [facility]) CM/SW Contact:    Gala Lewandowsky, RN Phone Number: 01/22/2022, 1:03 PM  Clinical Narrative:  Risk for Readmission Assessment Completed. Patient s/p lumbar drain 01-17-22. Patient remains on IV antibiotic therapy. PTA patient states she was from home alone and was still working. Patient states she has DME rolling walker and cane in the home. Patient states her sister is visiting from [place] and will be here for a while. Patient will benefit from PT/OT consult for recommendations. Case Manager will continue to follow for transition of care needs as she progresses.          Expected Discharge Plan: Home w Home Health Services Barriers to Discharge: Continued Medical Work up   Patient Goals and CMS Choice Patient states their goals for this hospitalization and ongoing recovery are:: patient wants to return home.      Expected Discharge Plan and Services Expected Discharge Plan: Home w Home Health Services In-house Referral: NA Discharge Planning Services: CM Consult   Living arrangements for the past 2 months: Single Family Home                  Prior Living Arrangements/Services Living arrangements for the past 2 months: Single Family Home Lives with:: Self (patients sister is visiting from [place].) Patient language and need for interpreter reviewed:: Yes        Need for Family Participation in Patient Care: Yes (Comment) Care giver support system in place?: Yes (comment) Current home services: DME (Patient has DME rolling walker, Cane in the home.) Criminal Activity/Legal Involvement Pertinent to Current Situation/Hospitalization: No - Comment as needed  Activities of Daily Living Home Assistive Devices/Equipment: Cane (specify quad or straight), Eyeglasses, Ostomy supplies, Walker (specify type) ADL  Screening (condition at time of admission) Patient's cognitive ability adequate to safely complete daily activities?: Yes Is the patient deaf or have difficulty hearing?: No Does the patient have difficulty seeing, even when wearing glasses/contacts?: No Does the patient have difficulty concentrating, remembering, or making decisions?: No Patient able to express need for assistance with ADLs?: Yes Does the patient have difficulty dressing or bathing?: No Independently performs ADLs?: Yes (appropriate for developmental age) Does the patient have difficulty walking or climbing stairs?: Yes Weakness of Legs: Both Weakness of Arms/Hands: None  Permission Sought/Granted Permission sought to share information with : Case Manager, Family Supports     Emotional Assessment Appearance:: Appears stated age Attitude/Demeanor/Rapport: Engaged Affect (typically observed): Appropriate Orientation: : Oriented to Situation, Oriented to  Time, Oriented to Place, Oriented to Self Alcohol / Substance Use: Not Applicable Psych Involvement: No (comment)  Admission diagnosis:  Leg weakness, bilateral [R29.898] Weakness of both lower extremities [R29.898] Thoracic aortic aneurysm (HCC) [I71.20] Patient Active Problem List   Diagnosis Date Noted   Stage 3 chronic kidney disease (HCC)    Leukocytosis    Leg weakness, bilateral 01/17/2022   Thoracic aortic aneurysm (HCC) 01/15/2022   Annual physical exam 11/05/2021   Depression, major, single episode, complete remission (HCC) 06/21/2021   S/P exploratory laparotomy 06/21/2021   Mass of right breast 06/21/2021   Disability examination 06/21/2021   Pressure injury of skin 06/02/2021   Hyperkalemia 06/01/2021   Acute hyperkalemia 06/01/2021   PAF (paroxysmal atrial fibrillation) (HCC) 05/07/2021   Essential hypertension 05/07/2021   Pure hypercholesterolemia 05/07/2021   Debility 04/15/2021  Acute hypoxemic respiratory failure ([place]) 04/07/2021    Malnutrition of moderate degree 04/03/2021   Encounter for postanesthesia care 04/02/2021   Peritonitis ([place]) 04/02/2021   Endotracheally intubated 04/02/2021   AKI (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 04/02/2021   S/P ascending aortic replacement 03/25/2021   Encounter for weaning from ventilator [facility])    Status post surgery 03/24/2021   Tobacco abuse 07/30/2017   History of drug use 07/30/2017   Influenza, pneumonia    Aortic dissection distal to left subclavian ([place]) 05/31/2017   Descending thoracic dissection ([place]) 05/31/2017   Chest pain 05/31/2017   Coronary artery disease due to lipid rich plaque    Acute on chronic diastolic CHF (congestive heart failure) ([place])    Acute coronary syndrome ([place]) 12/23/2015   NSTEMI (non-ST elevated myocardial infarction) ([place]) 12/22/2015   PCP:  Renee Rival, FNP Pharmacy:   [facility] [place] [address] [place] [address] Phone: [phone] Fax: [phone]  Readmission Risk Interventions    01/22/2022   12:57 PM 06/06/2021    1:23 PM 04/15/2021    1:58 PM  Readmission Risk Prevention Plan  Transportation Screening Complete Complete   PCP or Specialist Appt within 3-5 Days Complete    Home Care Screening   Complete  Medication Review (RN CM)   Complete  HRI or Home Care Consult Complete    Social Work Consult for [place] Planning/Counseling Complete    Palliative Care Screening Not Applicable    Medication Review [employer]) Referral to Pharmacy Complete   PCP or Specialist appointment within 3-5 days of discharge  Complete   HRI or [place]  Complete   [place]  Not Applicable

## 2022-01-23 DIAGNOSIS — I712 Thoracic aortic aneurysm, without rupture, unspecified: Secondary | ICD-10-CM | POA: Diagnosis not present

## 2022-01-23 LAB — CBC WITH DIFFERENTIAL/PLATELET
Abs Immature Granulocytes: 0.46 10*3/uL — ABNORMAL HIGH (ref 0.00–0.07)
Basophils Absolute: 0 10*3/uL (ref 0.0–0.1)
Basophils Relative: 0 %
Eosinophils Absolute: 0 10*3/uL (ref 0.0–0.5)
Eosinophils Relative: 0 %
HCT: 23.2 % — ABNORMAL LOW (ref 36.0–46.0)
Hemoglobin: 7.6 g/dL — ABNORMAL LOW (ref 12.0–15.0)
Immature Granulocytes: 2 %
Lymphocytes Relative: 9 %
Lymphs Abs: 1.7 10*3/uL (ref 0.7–4.0)
MCH: 33.2 pg (ref 26.0–34.0)
MCHC: 32.8 g/dL (ref 30.0–36.0)
MCV: 101.3 fL — ABNORMAL HIGH (ref 80.0–100.0)
Monocytes Absolute: 2.1 10*3/uL — ABNORMAL HIGH (ref 0.1–1.0)
Monocytes Relative: 11 %
Neutro Abs: 15.4 10*3/uL — ABNORMAL HIGH (ref 1.7–7.7)
Neutrophils Relative %: 78 %
Platelets: 253 10*3/uL (ref 150–400)
RBC: 2.29 MIL/uL — ABNORMAL LOW (ref 3.87–5.11)
RDW: 16.6 % — ABNORMAL HIGH (ref 11.5–15.5)
WBC: 19.8 10*3/uL — ABNORMAL HIGH (ref 4.0–10.5)
nRBC: 0.4 % — ABNORMAL HIGH (ref 0.0–0.2)

## 2022-01-23 LAB — GLUCOSE, CAPILLARY
Glucose-Capillary: 115 mg/dL — ABNORMAL HIGH (ref 70–99)
Glucose-Capillary: 133 mg/dL — ABNORMAL HIGH (ref 70–99)
Glucose-Capillary: 195 mg/dL — ABNORMAL HIGH (ref 70–99)
Glucose-Capillary: 139 mg/dL — ABNORMAL HIGH (ref 70–99)

## 2022-01-23 LAB — BASIC METABOLIC PANEL
Anion gap: 10 (ref 5–15)
BUN: 36 mg/dL — ABNORMAL HIGH (ref 6–20)
CO2: 22 mmol/L (ref 22–32)
Calcium: 9.5 mg/dL (ref 8.9–10.3)
Chloride: 109 mmol/L (ref 98–111)
Creatinine, Ser: 2.54 mg/dL — ABNORMAL HIGH (ref 0.44–1.00)
GFR, Estimated: 21 mL/min — ABNORMAL LOW (ref >60)
Glucose, Bld: 124 mg/dL — ABNORMAL HIGH (ref 70–99)
Potassium: 4.2 mmol/L (ref 3.5–5.1)
Sodium: 141 mmol/L (ref 135–145)

## 2022-01-23 MED ORDER — ASPIRIN 81 MG PO TBEC
81.0000 mg | DELAYED_RELEASE_TABLET | Freq: Every day | ORAL | Status: DC
  Administered 2022-01-23 – 2022-01-29 (×7): 81 mg via ORAL
  Filled 2022-01-23 (×7): qty 1

## 2022-01-23 MED ORDER — CLONAZEPAM 0.5 MG PO TABS
0.5000 mg | ORAL_TABLET | Freq: Two times a day (BID) | ORAL | Status: DC | PRN
  Administered 2022-01-23 (×2): 0.5 mg via ORAL
  Filled 2022-01-23 (×2): qty 1

## 2022-01-23 NOTE — Evaluation (Addendum)
Physical Therapy Evaluation Patient Details Name: Dorothy Harris MRN: [id number] DOB: 1960/04/12 Today's Date: 01/23/2022  History of Present Illness  61yo female admitted 10/13 with bil LE weakness with CSF leak. lumbar drain 10/13-10/18. Pt s/p thoracic aneurysm repair 10/11 with D/c 10/12. PMhx: HTN, HLD, colon resection s/p ileostomy  Clinical Impression  Pt pleasant and eager to get OOB. Pt without HA, back pain or numbness in extremities throughout session. Pt with decreased strength right hip flexion, impaired balance, decreased power, mobility and gait who will benefit from acute therapy to maximize mobility, safety, strength and function to decrease burden of care.   SPO2 96% on RA HR 104      Recommendations for follow up therapy are one component of a multi-disciplinary discharge planning process, led by the attending physician.  Recommendations may be updated based on patient status, additional functional criteria and insurance authorization.  Follow Up Recommendations Acute inpatient rehab (3hours/day)      Assistance Recommended at Discharge Frequent or constant Supervision/Assistance  Patient can return home with the following  A lot of help with walking and/or transfers;A lot of help with bathing/dressing/bathroom;Assistance with cooking/housework;Assist for transportation;Help with stairs or ramp for entrance    Equipment Recommendations None recommended by PT  Recommendations for Other Services  OT consult;Rehab consult    Functional Status Assessment Patient has had a recent decline in their functional status and demonstrates the ability to make significant improvements in function in a reasonable and predictable amount of time.     Precautions / Restrictions Precautions Precautions: Fall;Other (comment) Precaution Comments: ostomy      Mobility  Bed Mobility Overal bed mobility: Needs Assistance Bed Mobility: Supine to Sit     Supine to sit: HOB  elevated, Min assist     General bed mobility comments: min assist to clear RLE off of bed with HOB 25 degrees, increased time and cues for sequence    Transfers Overall transfer level: Needs assistance   Transfers: Sit to/from Stand, Bed to chair/wheelchair/BSC Sit to Stand: Mod assist Stand pivot transfers: Min assist         General transfer comment: mod assist to rise from bed x 2 trials with cues to push down into surface and RW in standing as pt with posterior LOB and pulling RW. Min assist in standing for anterior translation and balance. Pt able to side step 3 steps toward [facility] then pivot to chair with use of RW and min assist for balance and stability    Ambulation/Gait                  Stairs            Wheelchair Mobility    Modified Rankin (Stroke Patients Only)       Balance Overall balance assessment: Needs assistance Sitting-balance support: No upper extremity supported, Feet supported Sitting balance-Leahy Scale: Good   Postural control: Posterior lean Standing balance support: Bilateral upper extremity supported Standing balance-Leahy Scale: Poor Standing balance comment: min assist with bil UE support on RW, posterior bias                             Pertinent Vitals/Pain Pain Assessment Pain Assessment: No/denies pain    Home Living Family/patient expects to be discharged to:: Private residence Living Arrangements: Alone Available Help at Discharge: Family;Available 24 hours/day Type of Home: House Home Access: Stairs to enter   [employer] of Steps:  1   Home Layout: One level Home Equipment: Conservation officer, nature (2 wheels);Shower seat;Cane - single point      Prior Function Prior Level of Function : Independent/Modified Independent             Mobility Comments: pt reports she wasn't using RW until surgery 10/11 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         Extremity/Trunk Assessment   Upper Extremity Assessment Upper Extremity Assessment: Overall WFL for tasks assessed    Lower Extremity Assessment Lower Extremity Assessment: RLE deficits/detail RLE Deficits / Details: 3/5 hip flexion, knee flexion and extension 4/5    Cervical / Trunk Assessment Cervical / Trunk Assessment: Normal  Communication   Communication: No difficulties  Cognition Arousal/Alertness: Awake/alert Behavior During Therapy: WFL for tasks assessed/performed Overall Cognitive Status: Within Functional Limits for tasks assessed                                          General Comments      Exercises     Assessment/Plan    PT Assessment Patient needs continued PT services  PT Problem List Decreased strength;Decreased mobility;Decreased activity tolerance;Decreased balance;Decreased knowledge of use of DME       PT Treatment Interventions DME instruction;Gait training;Functional mobility training;Therapeutic activities;Patient/family education;Balance training;Stair training;Therapeutic exercise    PT Goals (Current goals can be found in the Care Plan section)  Acute Rehab PT Goals Patient Stated Goal: return to home and work PT Goal Formulation: With patient/family Time For Goal Achievement: 02/06/22 Potential to Achieve Goals: Good    Frequency Min 3X/week     Co-evaluation               AM-PAC PT "6 Clicks" Mobility  Outcome Measure Help needed turning from your back to your side while in a flat bed without using bedrails?: A Little Help needed moving from lying on your back to sitting on the side of a flat bed without using bedrails?: A Little Help needed moving to and from a bed to a chair (including a wheelchair)?: A Lot Help needed standing up from a chair using your arms (e.g., wheelchair or bedside chair)?: A Lot Help needed to walk in hospital room?: Total Help needed climbing 3-5 steps with a railing? : Total 6  Click Score: 12    End of Session Equipment Utilized During Treatment: Gait belt Activity Tolerance: Patient tolerated treatment well Patient left: in chair;with call bell/phone within reach;with family/visitor present;with nursing/sitter in room Nurse Communication: Mobility status PT Visit Diagnosis: Other abnormalities of gait and mobility (R26.89);Muscle weakness (generalized) (M62.81)    Time: 4503-8882 PT Time Calculation (min) (ACUTE ONLY): 24 min   Charges:   PT Evaluation $PT Eval Moderate Complexity: 1 Mod PT Treatments $Therapeutic Activity: 8-22 mins        Bayard Males, PT Acute Rehabilitation Services Office: [phone]   Sandy Salaam Shakeisha Horine 01/23/2022, 1:43 PM

## 2022-01-23 NOTE — TOC Progression Note (Signed)
Transition of Care [facility]) - Progression Note    Patient Details  Name: Dorothy Harris MRN: [id number] Date of Birth: Aug 19, 1960  Transition of Care [facility]) CM/SW Contact  Dorothy Labrum, RN Phone Number: 01/23/2022, 3:23 PM  Clinical Narrative:    The patient was transferred to [facility] [place] for Inpatient care.  Patient was evaluated by [facility]patient rehab today and the patient's insurance is out of network for admission to the facility.  I sent Dorothy Harris, CM with CIR a message to include the patient's Medicaid policy # [id number] L as well.  The patient states that she was recently discharged from the 4th floor rehab unit with the same insurance provider.  The patient states that she would prefer to admit to [facility] CIR if possible, otherwise she is agreeable for placement at [facility] in [place].  Choice was given to the patient regarding patient and CM will continue to follow for inpatient rehab needs.  CM also spoke with Dorothy Harris, CM at [place] rehabilitation and clinicals will be sent to [facility] facility to review as well.  Clinicals were faxed to [place], [place] at [phone].  CM will continue to follow the patient for inpatient rehabilitation placement.     Expected Discharge Plan: [place] Barriers to Discharge: Continued Medical Work up  Expected Discharge Plan and Services Expected Discharge Plan: [place] In-house Referral: NA Discharge Planning Services: CM Consult   Living arrangements for the past 2 months: Single Family Home                                       Social Determinants of Health (SDOH) Interventions    Readmission Risk Interventions    01/22/2022   12:57 PM 06/06/2021    1:23 PM 04/15/2021    1:58 PM  Readmission Risk Prevention Plan  Transportation Screening Complete Complete   PCP or Specialist Appt within 3-5 Days Complete    Home Care Screening    Complete  Medication Review (RN CM)   Complete  HRI or Home Care Consult Complete    Social Work Consult for [place] Planning/Counseling Complete    Palliative Care Screening Not Applicable    Medication Review Press photographer) Referral to Pharmacy Complete   PCP or Specialist appointment within 3-5 days of discharge  Complete   HRI or [place]  Complete   [place]  Not Applicable

## 2022-01-23 NOTE — Progress Notes (Signed)
Patient has arrived to the floor  

## 2022-01-23 NOTE — Progress Notes (Signed)
Inpatient Rehab Admissions Coordinator:  Consult received. Pt's insurance is out of network. Other rehab venues should be pursued.   Gayland Curry, [place], [place] Admissions Coordinator [phone]

## 2022-01-23 NOTE — Progress Notes (Signed)
NAME:  Dorothy Harris, MRN:  [id number], DOB:  [birth year], LOS: 6 ADMISSION DATE:  01/17/2022, CONSULTATION DATE:  01/17/2022 REFERRING MD:  Dr Trula Slade, CHIEF COMPLAINT:  post-TEVAR weakness  History of Present Illness:  62 yo female with known hypertension, hyperlipidemia, s/p endovascular repair of thoracic aortic aneurysm with coverage of the left subclavian artery and left leg thrombectomy on 10/11 admitted today with acute lower extremity weakness with suspected etiology spinal cord ischemia.     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  She follows with Vascular surgery as outpatient.  She developed aneurysmal changes to her descending aorta, measuring 6 mm in diameter and had TVAR planned for 10/11. POD 1, she was moving all extremities and beginning to mobilize, she was discharged home on 10/12.  Today, she presented to the ED at [facility] today with inability to walk at 0300, 7 hours prior to presentation to ED.  VS on presentation: Afebrile, HR: 80, RR 19, BP: 124/63, SpO2 93% on room air.  She received a bolus of IVF and transferred to [facility].   Pertinent  Medical History  AAA GERD HTN Paroxysmal Afib HLD CKD 4 Type I aortic dissection, s/p aortic arch reconstruction  Significant Hospital Events: Including procedures, antibiotic start and stop dates in addition to other pertinent events   Admit, bilateral lower extremity weakness R > L. Lumbar drain placed.  MRI C spine and T spine with no abnormal cord signal or abnormality otherwise.  10/16 drain clamped 10/17 off pressors c/o left foot pain c/w prior gout complaints 10/18 left foot pain improving with prednisone for gout.  Persistent headache with CSF leak.  Interim History / Subjective:  Left foot pain is improving.  Headache and  leak improved when stitch placed.   Objective   Blood pressure (!) 155/80, pulse 68, temperature 97.8 F (36.6 C), temperature source Axillary, resp. rate 14, height $RemoveBe'5\' 7"'VaifHyYlb$  (1.702 m), weight 83.5 kg, SpO2 98 %.        Intake/Output Summary (Last 24 hours) at 01/23/2022 1137 Last data filed at 01/23/2022 0930 Gross per 24 hour  Intake 460 ml  Output 1875 ml  Net -1415 ml    Filed Weights   01/20/22 2000  Weight: 83.5 kg    Examination:   General 61 year old female resting in bed no acute distress HENT NCAT no JVD  Pulm clear  Card rrr currently NSR Abd soft  Ext warm and dry  Neuro intact. Strength 4/5 equal LEs. Has LLE pain over top of foot. Similar to prior gout   Resolved Hospital Problem list   Creatinine has risen to 2.  1 4 WBC remains elevated to 19.8 now improving. Assessment & Plan:   Bilateral lower extremity weakness, concern for spinal cord ischemia s/p TEVAR.  No [place] infarction on MRI. Plan Allow blood pressure autoregulation. PT OT Freq neuro checks Hgb goal > 8 Neurovascular checks   CSF leak - improved with skin stitch. -Remove stitch in 1 week's time  Ongoing fevers & leukocytosis appears to be coming down. Temp trend down for last 24hrs; wbc still elevated. BC - to date. Resp culture (P) Plan Day 3 of 5 azith D 5/x cefepime D 5/x vanc Will stop abx 48hrs after drain removal  CKD, stage IIIb Scr w/in baseline Plan Avoid nephro toxins Cont PTA  bicarb Renal dose meds Avoid volume depletion  Am chem  H/o hypertension Plan Holding meds d/t hypotension  Afib, paroxysmal-->currently NSR...had been 1 isolated to a hospitalization during sepsis.  CHA2DS2-VASc Score = 4  Plan Cont tele  Cont amio while on Norepi then stop If she were to have recurrent AF I think we should give strong consideration to systemic AC   HLD Plan Statin  Anemia- stable - goal Hb >8 for spinal perfusion Plan Trend cbc Transfuse as indicated   Chronic  ileostomy Plan Routine ostomy care   Hyperglycemia, controlled Plan Ssi, goal < 180   Former tobacco abuse - quit in 2022; not sure how many pack years Plan Consider lung cancer screening consult at dc   Left foot pain w/ h/o hyperuricemia and excellent response to pred in past Plan Methylprednisolone 40 mg daily for 5 days.  Discussed with vascular surgery.  Ready for transfer.  Best Practice (right click and "Reselect all SmartList Selections" daily)   Diet/type: Regular consistency (see orders) DVT prophylaxis: SCD GI prophylaxis: PPI (home omeprazole) Lines: Central line Foley:  Yes, and it is still needed Code Status:  full code Last date of multidisciplinary goals of care discussion [Sister at bedside]  Kipp Brood, MD [facility] ICU Physician [place]  Pager: [phone] Or Epic Secure Chat After hours: [phone].  01/23/2022, 11:37 AM

## 2022-01-23 NOTE — Progress Notes (Addendum)
  Progress Note    01/23/2022 6:52 AM   Subjective:  says her headache is better.  Had to put a stitch where fluid was leaking.  She says they checked it last night and it is better.    Afebrile HR 50's-70's 166'M-600'K systolic 59% 9HF4FS  Vitals:   01/23/22 0400 01/23/22 0500  BP: (!) 157/85 (!) 155/80  Pulse: 70 68  Resp: 18 14  Temp:    SpO2: 99% 98%    Physical Exam: Cardiac:  regular Lungs:  non labored Incisions:  left groin incision is clean and dry Extremities:  palpable bilateral radial pulses and bilateral PT pulses Abdomen:  soft  CBC    Component Value Date/Time   WBC 23.5 (H) 01/21/2022 0347   RBC 2.53 (L) 01/21/2022 0347   HGB 8.3 (L) 01/21/2022 0347   HCT 25.3 (L) 01/21/2022 0347   PLT 188 01/21/2022 0347   MCV 100.0 01/21/2022 0347   MCH 32.8 01/21/2022 0347   MCHC 32.8 01/21/2022 0347   RDW 16.5 (H) 01/21/2022 0347   LYMPHSABS 1.5 01/17/2022 1106   MONOABS 2.6 (H) 01/17/2022 1106   EOSABS 0.0 01/17/2022 1106   BASOSABS 0.0 01/17/2022 1106    BMET    Component Value Date/Time   NA 141 01/23/2022 0431   NA 139 12/23/2021 0000   K 4.2 01/23/2022 0431   CL 109 01/23/2022 0431   CO2 22 01/23/2022 0431   GLUCOSE 124 (H) 01/23/2022 0431   BUN 36 (H) 01/23/2022 0431   BUN 24 (A) 12/23/2021 0000   CREATININE 2.54 (H) 01/23/2022 0431   CREATININE 1.69 (H) 08/21/2017 0935   CALCIUM 9.5 01/23/2022 0431   GFRNONAA 21 (L) 01/23/2022 0431   GFRNONAA 33 (L) 08/21/2017 0935   GFRAA 53 (L) 10/07/2017 0720   GFRAA 39 (L) 08/21/2017 0935    INR    Component Value Date/Time   INR 1.2 01/21/2022 0347     Intake/Output Summary (Last 24 hours) at 01/23/2022 0652 Last data filed at 01/23/2022 0600 Gross per 24 hour  Intake 1118.94 ml  Output 1500 ml  Net -381.06 ml     Assessment/Plan:  61 y.o. female is s/p:  repair of thoracic aortic aneurysm on 01/15/2022.  She was discharged home with no lower extremity weakness.  She was readmitted  Friday with bilateral lower extremity weakness with concern for spinal cord ischemia in setting of covered stent graft.     -pt doing well this morning. HA improved -palpable pulses and motor and sensory are in tact -most likely ok to start asa but will defer to Dr. Trula Slade. -DVT prophylaxis:  sq heparin -creatinine at 2.5 this morning, baseline about a month ago.  Continue to monitor.  -continue abx for pna.  Down to 1LO2  Leontine Locket, PA-C [facility] [phone] 01/23/2022 6:52 AM   I agree with the above OK to tx to floor Spinal HA are better, and drainage decreased Start ASA PT/OT consult  Annamarie Major

## 2022-01-24 LAB — BASIC METABOLIC PANEL
Anion gap: 11 (ref 5–15)
BUN: 33 mg/dL — ABNORMAL HIGH (ref 6–20)
CO2: 22 mmol/L (ref 22–32)
Calcium: 9.2 mg/dL (ref 8.9–10.3)
Chloride: 108 mmol/L (ref 98–111)
Creatinine, Ser: 2.31 mg/dL — ABNORMAL HIGH (ref 0.44–1.00)
GFR, Estimated: 24 mL/min — ABNORMAL LOW (ref >60)
Glucose, Bld: 114 mg/dL — ABNORMAL HIGH (ref 70–99)
Potassium: 4 mmol/L (ref 3.5–5.1)
Sodium: 141 mmol/L (ref 135–145)

## 2022-01-24 LAB — GLUCOSE, CAPILLARY
Glucose-Capillary: 158 mg/dL — ABNORMAL HIGH (ref 70–99)
Glucose-Capillary: 121 mg/dL — ABNORMAL HIGH (ref 70–99)
Glucose-Capillary: 121 mg/dL — ABNORMAL HIGH (ref 70–99)

## 2022-01-24 NOTE — Evaluation (Signed)
Occupational Therapy Evaluation Patient Details Name: Dorothy Harris MRN: [id number] DOB: September 28, 1960 Today's Date: 01/24/2022   History of Present Illness 60yo female admitted 10/13 with bil LE weakness with CSF leak. lumbar drain 10/13-10/18. Pt s/p thoracic aneurysm repair 10/11 with D/c 10/12. PMhx: HTN, HLD, colon resection s/p ileostomy   Clinical Impression   PTA, pt lives alone, typically Independent in all daily tasks including full time work. Pt presents now after prolonged time in bed with deficits in strength, standing balance, and overall endurance. Pt able to progress taking steps today with overall Mod A to stand and Min A to step to recliner using RW. Pt requires Min A for UB ADL and Mod A for LB ADLs d/t deficits and requires hands on assist at all times in standing. Pt motivated to return to PLOF and feel pt is an excellent candidate for AIR level therapies at DC.       Recommendations for follow up therapy are one component of a multi-disciplinary discharge planning process, led by the attending physician.  Recommendations may be updated based on patient status, additional functional criteria and insurance authorization.   Follow Up Recommendations  Acute inpatient rehab (3hours/day)    Assistance Recommended at Discharge Frequent or constant Supervision/Assistance  Patient can return home with the following A lot of help with walking and/or transfers;A lot of help with bathing/dressing/bathroom    Functional Status Assessment  Patient has had a recent decline in their functional status and demonstrates the ability to make significant improvements in function in a reasonable and predictable amount of time.  Equipment Recommendations  None recommended by OT    Recommendations for Other Services       Precautions / Restrictions Precautions Precautions: Fall;Other (comment) Precaution Comments: ostomy Restrictions Weight Bearing Restrictions: No      Mobility Bed  Mobility Overal bed mobility: Needs Assistance Bed Mobility: Supine to Sit     Supine to sit: Supervision, HOB elevated          Transfers Overall transfer level: Needs assistance Equipment used: Rolling walker (2 wheels) Transfers: Sit to/from Stand, Bed to chair/wheelchair/BSC Sit to Stand: Mod assist     Step pivot transfers: Min assist     General transfer comment: Able to stand with Mod A from bedside with assist to correct initial posterior bias, able to take steps to chair with assist for RW mgmt and to prevent LOB      Balance Overall balance assessment: Needs assistance Sitting-balance support: No upper extremity supported, Feet supported Sitting balance-Leahy Scale: Good   Postural control: Posterior lean Standing balance support: Bilateral upper extremity supported Standing balance-Leahy Scale: Poor                             ADL either performed or assessed with clinical judgement   ADL Overall ADL's : Needs assistance/impaired Eating/Feeding: Independent   Grooming: Standing;Minimal assistance   Upper Body Bathing: Minimal assistance;Sitting   Lower Body Bathing: Moderate assistance;Sitting/lateral leans;Sit to/from stand   Upper Body Dressing : Set up;Sitting   Lower Body Dressing: Moderate assistance;Sit to/from stand;Sitting/lateral leans   Toilet Transfer: Minimal assistance;Stand-pivot;BSC/3in1;Rolling walker (2 wheels)   Toileting- Clothing Manipulation and Hygiene: Moderate assistance;Sitting/lateral lean;Sit to/from stand         General ADL Comments: Pt limited by weakness from prolonged time in bed, able to progress taking steps with RW for transfers today though noted posterior bias and unsteadiness  Vision Baseline Vision/History: 1 Wears glasses (reading) Ability to See in Adequate Light: 0 Adequate Patient Visual Report: No change from baseline Vision Assessment?: No apparent visual deficits     Perception      Praxis      Pertinent Vitals/Pain Pain Assessment Pain Assessment: Faces Faces Pain Scale: Hurts a little bit Pain Location: L foot Pain Descriptors / Indicators: Sore Pain Intervention(s): Monitored during session     Hand Dominance Right   Extremity/Trunk Assessment Upper Extremity Assessment Upper Extremity Assessment: Generalized weakness   Lower Extremity Assessment Lower Extremity Assessment: Defer to PT evaluation   Cervical / Trunk Assessment Cervical / Trunk Assessment: Normal   Communication Communication Communication: No difficulties   Cognition Arousal/Alertness: Awake/alert Behavior During Therapy: WFL for tasks assessed/performed Overall Cognitive Status: Within Functional Limits for tasks assessed                                       General Comments  Unable to locate chair alarm on unit - nursing secretary aware    Exercises     Shoulder Instructions      Home Living Family/patient expects to be discharged to:: Private residence Living Arrangements: Alone Available Help at Discharge: Family;Available 24 hours/day Type of Home: House Home Access: Stairs to enter [employer] of Steps: 1 Entrance Stairs-Rails: Left Home Layout: One level     Bathroom Shower/Tub: Teacher, early years/pre: Standard     Home Equipment: Conservation officer, nature (2 wheels);Shower seat;Cane - single point          Prior Functioning/Environment Prior Level of Function : Independent/Modified Independent             Mobility Comments: pt reports she wasn't using RW until surgery 10/11 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, drilling Problem List: Decreased strength;Decreased activity tolerance;Impaired balance (sitting and/or standing)      OT Treatment/Interventions: Self-care/ADL training;Therapeutic exercise;Energy conservation;DME and/or AE instruction;Therapeutic activities    OT Goals(Current goals  can be found in the care plan section) Acute Rehab OT Goals Patient Stated Goal: go to AIR OT Goal Formulation: With patient Time For Goal Achievement: 02/07/22 Potential to Achieve Goals: Good  OT Frequency: Min 2X/week    Co-evaluation              AM-PAC OT "6 Clicks" Daily Activity     Outcome Measure Help from another person eating meals?: None Help from another person taking care of personal grooming?: A Little Help from another person toileting, which includes using toliet, bedpan, or urinal?: A Lot Help from another person bathing (including washing, rinsing, drying)?: A Lot Help from another person to put on and taking off regular upper body clothing?: A Little Help from another person to put on and taking off regular lower body clothing?: A Lot 6 Click Score: 16   End of Session Equipment Utilized During Treatment: Rolling walker (2 wheels);Gait belt Nurse Communication: Mobility status  Activity Tolerance: Patient tolerated treatment well Patient left: in bed;with call bell/phone within reach;Other (comment) (unable to locate chair alarm on unit)  OT Visit Diagnosis: Unsteadiness on feet (R26.81);Other abnormalities of gait and mobility (R26.89);Muscle weakness (generalized) (M62.81)                Time: 0347-4259 OT Time Calculation (min): 24 min Charges:  OT General Charges $OT Visit:  1 Visit OT Evaluation $OT Eval Moderate Complexity: 1 Mod OT Treatments $Self Care/Home Management : 8-22 mins  Malachy Chamber, OTR/L Acute Rehab Services Office: [phone]   Layla Maw 01/24/2022, 8:06 AM

## 2022-01-24 NOTE — Progress Notes (Addendum)
Spoke to [employer] with CIR who confirmed pt's insurance is OON with CIR. Confirmed with Webb Silversmith at [facility] [phone] that referral was received and is being reviewed by their physician. Will provide updates as available.   UPDATE: bed offer received from [facility] at [facility]. Spoke to pt and she is agreeable to [facility]. They will plan to open with insurance next week when pt is closer to being ready for dc.   Wandra Feinstein, MSW, LCSW [phone] (coverage)

## 2022-01-24 NOTE — Plan of Care (Signed)
  Problem: Education: Goal: Knowledge of discharge needs will improve Outcome: Progressing   Problem: Clinical Measurements: Goal: Postoperative complications will be avoided or minimized Outcome: Progressing   Problem: Respiratory: Goal: Ability to achieve and maintain a regular respiratory rate will improve Outcome: Progressing   Problem: Skin Integrity: Goal: Demonstration of wound healing without infection will improve Outcome: Progressing   Problem: Education: Goal: Knowledge of General Education information will improve Description: Including pain rating scale, medication(s)/side effects and non-pharmacologic comfort measures Outcome: Progressing   Problem: Clinical Measurements: Goal: Ability to maintain clinical measurements within normal limits will improve Outcome: Progressing Goal: Will remain free from infection Outcome: Progressing Goal: Diagnostic test results will improve Outcome: Progressing Goal: Respiratory complications will improve Outcome: Progressing Goal: Cardiovascular complication will be avoided Outcome: Progressing   Problem: Activity: Goal: Risk for activity intolerance will decrease Outcome: Progressing   Problem: Nutrition: Goal: Adequate nutrition will be maintained Outcome: Progressing   Problem: Coping: Goal: Level of anxiety will decrease Outcome: Progressing   Problem: Elimination: Goal: Will not experience complications related to bowel motility Outcome: Progressing Goal: Will not experience complications related to urinary retention Outcome: Progressing   Problem: Pain Managment: Goal: General experience of comfort will improve Outcome: Progressing   Problem: Education: Goal: Ability to describe self-care measures that may prevent or decrease complications (Diabetes Survival Skills Education) will improve Outcome: Progressing Goal: Individualized Educational Video(s) Outcome: Progressing   Problem: Coping: Goal:  Ability to adjust to condition or change in health will improve Outcome: Progressing   Problem: Tissue Perfusion: Goal: Adequacy of tissue perfusion will improve Outcome: Progressing

## 2022-01-24 NOTE — Progress Notes (Addendum)
  Progress Note    01/24/2022 6:41 AM   Subjective:  says she's feeling better.  Says when she worked with PT she couldn't stand very long.  Also says they had to put the purwick on last night.  Says she wants to get out of bed.    Tm 99.6 280'K-349'Z systolic HR 79'X 50% RA  Vitals:   01/23/22 2053 01/24/22 0407  BP: (!) 150/70 (!) 154/71  Pulse: 75 73  Resp: 15 17  Temp: 98.5 F (36.9 C) 99.6 F (37.6 C)  SpO2: 96% 94%    Physical Exam: General:  no distress Lungs:  non labored Incisions:  left groin incision looks good and healing nicely Extremities:  palpable PT pulses bilaterally Abdomen:  soft, NT  CBC    Component Value Date/Time   WBC 19.8 (H) 01/23/2022 0431   RBC 2.29 (L) 01/23/2022 0431   HGB 7.6 (L) 01/23/2022 0431   HCT 23.2 (L) 01/23/2022 0431   PLT 253 01/23/2022 0431   MCV 101.3 (H) 01/23/2022 0431   MCH 33.2 01/23/2022 0431   MCHC 32.8 01/23/2022 0431   RDW 16.6 (H) 01/23/2022 0431   LYMPHSABS 1.7 01/23/2022 0431   MONOABS 2.1 (H) 01/23/2022 0431   EOSABS 0.0 01/23/2022 0431   BASOSABS 0.0 01/23/2022 0431    BMET    Component Value Date/Time   NA 141 01/24/2022 0317   NA 139 12/23/2021 0000   K 4.0 01/24/2022 0317   CL 108 01/24/2022 0317   CO2 22 01/24/2022 0317   GLUCOSE 114 (H) 01/24/2022 0317   BUN 33 (H) 01/24/2022 0317   BUN 24 (A) 12/23/2021 0000   CREATININE 2.31 (H) 01/24/2022 0317   CREATININE 1.69 (H) 08/21/2017 0935   CALCIUM 9.2 01/24/2022 0317   GFRNONAA 24 (L) 01/24/2022 0317   GFRNONAA 33 (L) 08/21/2017 0935   GFRAA 53 (L) 10/07/2017 0720   GFRAA 39 (L) 08/21/2017 0935    INR    Component Value Date/Time   INR 1.2 01/21/2022 0347     Intake/Output Summary (Last 24 hours) at 01/24/2022 0641 Last data filed at 01/23/2022 1200 Gross per 24 hour  Intake --  Output 950 ml  Net -950 ml     Assessment/Plan:  61 y.o. female is s/p:  repair of thoracic aortic aneurysm on 01/15/2022.  She was discharged  home with no lower extremity weakness.  She was readmitted Friday with bilateral lower extremity weakness with concern for spinal cord ischemia in setting of covered stent graft.        -pt continues to make progress.  Continues with palpable PT pulses bilaterally.   -continue to mobilize.  Will order OOB to chair tid at meal times.  Continue working with PT.  -creatinine improved this am at 2.3 from 2.5.  leukocytosis yesterday improved to 19.8k from 23.5k.  she remains afebrile.  On abx for pna.  Pt now on room air.  -PT recommending acute IP rehab.  [facility] out of network and other rehab facilities are being pursued.   -DVT prophylaxis:  sq heparin   Leontine Locket, PA-C [facility] [phone] 01/24/2022 6:41 AM  I agree with the above.  Remains weak but able to get from bed to chair with PT Plan for CIR HA improved Minimal drainage from lumbar drain site  [place] Twilla Khouri

## 2022-01-25 LAB — BASIC METABOLIC PANEL
Anion gap: 12 (ref 5–15)
BUN: 32 mg/dL — ABNORMAL HIGH (ref 6–20)
CO2: 22 mmol/L (ref 22–32)
Calcium: 9.2 mg/dL (ref 8.9–10.3)
Chloride: 108 mmol/L (ref 98–111)
Creatinine, Ser: 2.23 mg/dL — ABNORMAL HIGH (ref 0.44–1.00)
GFR, Estimated: 25 mL/min — ABNORMAL LOW (ref >60)
Glucose, Bld: 115 mg/dL — ABNORMAL HIGH (ref 70–99)
Potassium: 4.5 mmol/L (ref 3.5–5.1)
Sodium: 142 mmol/L (ref 135–145)

## 2022-01-25 LAB — GLUCOSE, CAPILLARY
Glucose-Capillary: 110 mg/dL — ABNORMAL HIGH (ref 70–99)
Glucose-Capillary: 114 mg/dL — ABNORMAL HIGH (ref 70–99)
Glucose-Capillary: 100 mg/dL — ABNORMAL HIGH (ref 70–99)
Glucose-Capillary: 158 mg/dL — ABNORMAL HIGH (ref 70–99)
Glucose-Capillary: 128 mg/dL — ABNORMAL HIGH (ref 70–99)

## 2022-01-25 NOTE — Progress Notes (Signed)
PT Cancellation Note  Patient Details Name: Dorothy Harris MRN: [id number] DOB: [birth year]   Cancelled Treatment:    Reason Eval/Treat Not Completed: Patient declined, no reason specified. Pt declines PT intervention, reporting it is too late, she wants to take a nap. PT lets the patient know that therapy may not be able to return until Monday as she is on the pickup list, pt continues to decline PT intervention. PT will follow up as time allows.   Zenaida Niece 01/25/2022, 5:38 PM

## 2022-01-25 NOTE — Progress Notes (Signed)
  Progress Note    01/25/2022 9:38 AM * No surgery found *  Subjective: No overnight complaints and says that she was able to stand  Vitals:   01/25/22 0411 01/25/22 0808  BP: (!) 149/74 (!) 141/74  Pulse: 72 78  Resp: 15 18  Temp: 98.7 F (37.1 C) 98.4 F (36.9 C)  SpO2: 96% 93%    Physical Exam:  Awake alert and oriented Nonlabored respirations Moving both lower extremities although somewhat weak, sensation is intact  CBC    Component Value Date/Time   WBC 19.8 (H) 01/23/2022 0431   RBC 2.29 (L) 01/23/2022 0431   HGB 7.6 (L) 01/23/2022 0431   HCT 23.2 (L) 01/23/2022 0431   PLT 253 01/23/2022 0431   MCV 101.3 (H) 01/23/2022 0431   MCH 33.2 01/23/2022 0431   MCHC 32.8 01/23/2022 0431   RDW 16.6 (H) 01/23/2022 0431   LYMPHSABS 1.7 01/23/2022 0431   MONOABS 2.1 (H) 01/23/2022 0431   EOSABS 0.0 01/23/2022 0431   BASOSABS 0.0 01/23/2022 0431    BMET    Component Value Date/Time   NA 142 01/25/2022 0413   NA 139 12/23/2021 0000   K 4.5 01/25/2022 0413   CL 108 01/25/2022 0413   CO2 22 01/25/2022 0413   GLUCOSE 115 (H) 01/25/2022 0413   BUN 32 (H) 01/25/2022 0413   BUN 24 (A) 12/23/2021 0000   CREATININE 2.23 (H) 01/25/2022 0413   CREATININE 1.69 (H) 08/21/2017 0935   CALCIUM 9.2 01/25/2022 0413   GFRNONAA 25 (L) 01/25/2022 0413   GFRNONAA 33 (L) 08/21/2017 0935   GFRAA 53 (L) 10/07/2017 0720   GFRAA 39 (L) 08/21/2017 0935    INR    Component Value Date/Time   INR 1.2 01/21/2022 0347     Intake/Output Summary (Last 24 hours) at 01/25/2022 0938 Last data filed at 01/25/2022 0556 Gross per 24 hour  Intake --  Output 1550 ml  Net -1550 ml     Assessment:  61 y.o. female is status post TEVAR complicated by spinal cord ischemia now improving  Plan: Pending rehab Continue PT Subcutaneous heparin dvt ppx  Dorothy Harris C. Donzetta Matters, MD [facility] [place] Office: [phone] Pager: [phone]  01/25/2022 9:38 AM

## 2022-01-26 LAB — BASIC METABOLIC PANEL
Anion gap: 10 (ref 5–15)
BUN: 29 mg/dL — ABNORMAL HIGH (ref 6–20)
CO2: 24 mmol/L (ref 22–32)
Calcium: 9 mg/dL (ref 8.9–10.3)
Chloride: 106 mmol/L (ref 98–111)
Creatinine, Ser: 2.13 mg/dL — ABNORMAL HIGH (ref 0.44–1.00)
GFR, Estimated: 26 mL/min — ABNORMAL LOW (ref >60)
Glucose, Bld: 110 mg/dL — ABNORMAL HIGH (ref 70–99)
Potassium: 4.5 mmol/L (ref 3.5–5.1)
Sodium: 140 mmol/L (ref 135–145)

## 2022-01-26 LAB — GLUCOSE, CAPILLARY
Glucose-Capillary: 118 mg/dL — ABNORMAL HIGH (ref 70–99)
Glucose-Capillary: 107 mg/dL — ABNORMAL HIGH (ref 70–99)
Glucose-Capillary: 156 mg/dL — ABNORMAL HIGH (ref 70–99)
Glucose-Capillary: 126 mg/dL — ABNORMAL HIGH (ref 70–99)

## 2022-01-26 NOTE — Progress Notes (Signed)
  Progress Note    01/26/2022 8:43 AM * No surgery found *  Subjective: Did not work with PT yesterday  Vitals:   01/25/22 2020 01/26/22 0453  BP: (!) 147/67 (!) 146/81  Pulse: 69 71  Resp: 16 18  Temp: 98.5 F (36.9 C) 98.4 F (36.9 C)  SpO2: 97% 100%    Physical Exam: Awake alert oriented Nonlabored respirations Moving bilateral lower extremities with weakness, sensation intact  CBC    Component Value Date/Time   WBC 19.8 (H) 01/23/2022 0431   RBC 2.29 (L) 01/23/2022 0431   HGB 7.6 (L) 01/23/2022 0431   HCT 23.2 (L) 01/23/2022 0431   PLT 253 01/23/2022 0431   MCV 101.3 (H) 01/23/2022 0431   MCH 33.2 01/23/2022 0431   MCHC 32.8 01/23/2022 0431   RDW 16.6 (H) 01/23/2022 0431   LYMPHSABS 1.7 01/23/2022 0431   MONOABS 2.1 (H) 01/23/2022 0431   EOSABS 0.0 01/23/2022 0431   BASOSABS 0.0 01/23/2022 0431    BMET    Component Value Date/Time   NA 140 01/26/2022 0409   NA 139 12/23/2021 0000   K 4.5 01/26/2022 0409   CL 106 01/26/2022 0409   CO2 24 01/26/2022 0409   GLUCOSE 110 (H) 01/26/2022 0409   BUN 29 (H) 01/26/2022 0409   BUN 24 (A) 12/23/2021 0000   CREATININE 2.13 (H) 01/26/2022 0409   CREATININE 1.69 (H) 08/21/2017 0935   CALCIUM 9.0 01/26/2022 0409   GFRNONAA 26 (L) 01/26/2022 0409   GFRNONAA 33 (L) 08/21/2017 0935   GFRAA 53 (L) 10/07/2017 0720   GFRAA 39 (L) 08/21/2017 0935    INR    Component Value Date/Time   INR 1.2 01/21/2022 0347     Intake/Output Summary (Last 24 hours) at 01/26/2022 0843 Last data filed at 01/26/2022 0400 Gross per 24 hour  Intake 120 ml  Output 1250 ml  Net -1130 ml     Assessment:  61 y.o. female is s/p TEVAR complicated by spinal cord ischemia continues to improve daily  Plan: Encouraged work with PT Pending rehab   [place] C. Donzetta Matters, MD [facility] [place] Office: [phone] Pager: [phone]  01/26/2022 8:43 AM

## 2022-01-27 LAB — GLUCOSE, CAPILLARY
Glucose-Capillary: 111 mg/dL — ABNORMAL HIGH (ref 70–99)
Glucose-Capillary: 133 mg/dL — ABNORMAL HIGH (ref 70–99)
Glucose-Capillary: 96 mg/dL (ref 70–99)
Glucose-Capillary: 128 mg/dL — ABNORMAL HIGH (ref 70–99)

## 2022-01-27 NOTE — TOC Progression Note (Addendum)
Transition of Care [facility]) - Progression Note    Patient Details  Name: Dorothy Harris MRN: [id number] Date of Birth: [birth year]  Transition of [facility]) CM/SW [place], RN Phone Number: 01/27/2022, 9:46 AM  Clinical Narrative:    CM spoke with ann, CM at [facility] center and they have offered a bed to the patient.  Updated clinicals and therapy notes were forwarded so that Insurance authorization could be started by the facility.  Required clinicals were faxed to the facility at fax # [phone].  CM will continue to follow the patient for [facility] - pending insurance authorization.  I met with the patient and she was updated and aware.  Updated PT noted will be forwarded once the patient works with therapy today.  01/27/2022  Updated PT notes sent to [place] facility.  CM will continue to follow the patient for Inpatient rehab placement - insurance authorization is pending.   Expected Discharge Plan: [place] Barriers to Discharge: Continued Medical Work up  Expected Discharge Plan and Services Expected Discharge Plan: [place] In-house Referral: NA Discharge Planning Services: CM Consult   Living arrangements for the past 2 months: Single Family Home                                       Social Determinants of Health (SDOH) Interventions    Readmission Risk Interventions    01/22/2022   12:57 PM 06/06/2021    1:23 PM 04/15/2021    1:58 PM  Readmission Risk Prevention Plan  Transportation Screening Complete Complete   PCP or Specialist Appt within 3-5 Days Complete    Home Care Screening   Complete  Medication Review (RN CM)   Complete  HRI or Home Care Consult Complete    Social Work Consult for [place] Planning/Counseling Complete    Palliative Care Screening Not Applicable    Medication Review Press photographer) Referral to  Pharmacy Complete   PCP or Specialist appointment within 3-5 days of discharge  Complete   HRI or [place]  Complete   [place]  Not Applicable

## 2022-01-27 NOTE — Progress Notes (Signed)
Physical Therapy Treatment Patient Details Name: ADIEL MCNAMARA MRN: [id number] DOB: 1960-07-20 Today's Date: 01/27/2022   History of Present Illness 61yo female admitted 10/13 with bil LE weakness with CSF leak. lumbar drain 10/13-10/18. Pt s/p thoracic aneurysm repair 10/11 with D/c 10/12. PMhx: HTN, HLD, colon resection s/p ileostomy    PT Comments    Pt making good progress with mobility. Able to ambulate short distances today and motivated to work toward return to independence. Continue to recommend acute inpatient rehab (AIR) for post-acute therapy needs.    Recommendations for follow up therapy are one component of a multi-disciplinary discharge planning process, led by the attending physician.  Recommendations may be updated based on patient status, additional functional criteria and insurance authorization.  Follow Up Recommendations  Acute inpatient rehab (3hours/day)     Assistance Recommended at Discharge Frequent or constant Supervision/Assistance  Patient can return home with the following A lot of help with walking and/or transfers;A lot of help with bathing/dressing/bathroom;Assist for transportation;Help with stairs or ramp for entrance   Equipment Recommendations  None recommended by PT    Recommendations for Other Services       Precautions / Restrictions Precautions Precautions: Fall;Other (comment) Precaution Comments: ostomy Restrictions Weight Bearing Restrictions: No     Mobility  Bed Mobility Overal bed mobility: Needs Assistance Bed Mobility: Supine to Sit, Sit to Supine     Supine to sit: Supervision, HOB elevated Sit to supine: Min assist   General bed mobility comments: Assist to bring feet back up into bed    Transfers Overall transfer level: Needs assistance Equipment used: Rolling walker (2 wheels) Transfers: Sit to/from Stand Sit to Stand: Mod assist           General transfer comment: Assist to bring hips up and for initial  balance. Verbal cues for hand placement    Ambulation/Gait Ambulation/Gait assistance: Min assist Gait Distance (Feet): 15 Feet (x 2) Assistive device: Rolling walker (2 wheels) Gait Pattern/deviations: Step-through pattern, Decreased step length - right, Decreased step length - left, Wide base of support Gait velocity: decr Gait velocity interpretation: <1.31 ft/sec, indicative of household ambulator   General Gait Details: Assist for balance and support   Stairs             Wheelchair Mobility    Modified Rankin (Stroke Patients Only)       Balance Overall balance assessment: Needs assistance Sitting-balance support: No upper extremity supported, Feet supported Sitting balance-Leahy Scale: Good     Standing balance support: Bilateral upper extremity supported, Reliant on assistive device for balance Standing balance-Leahy Scale: Poor Standing balance comment: walker and min assist for static standing                            Cognition Arousal/Alertness: Awake/alert Behavior During Therapy: WFL for tasks assessed/performed Overall Cognitive Status: Within Functional Limits for tasks assessed                                          Exercises      General Comments        Pertinent Vitals/Pain Pain Assessment Pain Assessment: Faces Faces Pain Scale: Hurts a little bit Pain Location: bil feet Pain Descriptors / Indicators: Sore Pain Intervention(s): Monitored during session, Repositioned    Home Living  Prior Function            PT Goals (current goals can now be found in the care plan section) Progress towards PT goals: Progressing toward goals    Frequency    Min 3X/week      PT Plan Current plan remains appropriate    Co-evaluation              AM-PAC PT "6 Clicks" Mobility   Outcome Measure  Help needed turning from your back to your side while in a flat bed  without using bedrails?: A Little Help needed moving from lying on your back to sitting on the side of a flat bed without using bedrails?: A Little Help needed moving to and from a bed to a chair (including a wheelchair)?: A Lot Help needed standing up from a chair using your arms (e.g., wheelchair or bedside chair)?: A Lot Help needed to walk in hospital room?: Total Help needed climbing 3-5 steps with a railing? : Total 6 Click Score: 12    End of Session Equipment Utilized During Treatment: Gait belt Activity Tolerance: Patient tolerated treatment well Patient left: in bed;with call bell/phone within reach;with bed alarm set   PT Visit Diagnosis: Other abnormalities of gait and mobility (R26.89);Muscle weakness (generalized) (M62.81)     Time: 2897-9150 PT Time Calculation (min) (ACUTE ONLY): 14 min  Charges:  $Gait Training: 8-22 mins                     [place] [facility] [place] 01/27/2022, 3:42 PM

## 2022-01-27 NOTE — Discharge Summary (Cosign Needed Addendum)
Discharge Summary    Dorothy Harris 18-May-1960 61 y.o. female  [id number]  Admission Date: 01/17/2022  Discharge Date: 01/29/2022  Physician: Nada Libman, MD  Admission Diagnosis: Leg weakness, bilateral [R29.898] Weakness of both lower extremities [R29.898] Thoracic aortic aneurysm (HCC) [I71.20]   HPI:   This is a 61 y.o. 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  She developed aneurysmal changes to her descending thoracic aorta with a maximum diameter of 6 cm.  She underwent endovascular repair of thoracic aortic aneurysm with coverage of the left clavian artery and left leg thrombectomy by Dr. Myra Gianotti on Wednesday, 01/15/2022.  She tolerated the procedure well and was discharged the following day.  Last night she developed bilateral lower extremity weakness and called EMS.  Unfortunately she was not brought to emergency department and called EMS again this morning with ongoing leg weakness.  She states her weakness has not improved.  She is able to wiggle the toes of her left foot and flex her left ankle however she has no motor control of her right leg.  She was transferred urgently to [facility].  It should also be noted that a code stroke was called while at [facility] and CT head was negative.   Hospital Course:  The patient was admitted to the hospital in ICU and lumbar drain was placed by anesthesia for spinal cord ischemia.  She was placed on Levophed to keep her MAP greater than 90.  Shortly after that, she was able to wiggle her toes on the right.  By the next day, she was able to move both legs and bend her knees.  She did have a tm of 101.8.  she was found to have a pna and she was started on abx.    On HD 2, she did have some bloody drainage from the spinal drain and a head CT was obtained and there was no evidence of intracranial bleed.  Her exam was continuing to improve.  The MRI of cervical and  thoracic spine revealed no evidence of spinal cord ischemia.  Her CKD was at baseline.  On HD 4, she did have some pain across the top of the left foot.  She has hx of gout and she did get steroid dose and this did improve. Her spinal drain was removed and sq heparin was started 12 hours later for DVT prophylaxis.    On HD 5, she did have a CSF leak and a stitch was placed and this resolved.   Remainder of the hospital course:  she was weaned to room air.  She was working with PT.  They recommended acute rehab.  She was out of network for [facility] and was offered bed at [facility].  The remainder of hospitalization was based on waiting on authorization from insurance.  Her blood pressure medications have been discontinued to allow permissive hypertension for spinal cord ischemia.  CBC    Component Value Date/Time   WBC 19.8 (H) 01/23/2022 0431   RBC 2.29 (L) 01/23/2022 0431   HGB 7.6 (L) 01/23/2022 0431   HCT 23.2 (L) 01/23/2022 0431   PLT 253 01/23/2022 0431   MCV 101.3 (H) 01/23/2022 0431   MCH 33.2 01/23/2022 0431   MCHC 32.8 01/23/2022 0431   RDW 16.6 (H) 01/23/2022 0431   LYMPHSABS 1.7 01/23/2022 0431   MONOABS 2.1 (H) 01/23/2022 0431   EOSABS 0.0 01/23/2022 0431  BASOSABS 0.0 01/23/2022 0431    BMET    Component Value Date/Time   NA 140 01/26/2022 0409   NA 139 12/23/2021 0000   K 4.5 01/26/2022 0409   CL 106 01/26/2022 0409   CO2 24 01/26/2022 0409   GLUCOSE 110 (H) 01/26/2022 0409   BUN 29 (H) 01/26/2022 0409   BUN 24 (A) 12/23/2021 0000   CREATININE 2.13 (H) 01/26/2022 0409   CREATININE 1.69 (H) 08/21/2017 0935   CALCIUM 9.0 01/26/2022 0409   GFRNONAA 26 (L) 01/26/2022 0409   GFRNONAA 33 (L) 08/21/2017 0935   GFRAA 53 (L) 10/07/2017 0720   GFRAA 39 (L) 08/21/2017 0935      Discharge Instructions     Call MD for:  redness, tenderness, or signs of infection (pain, swelling, bleeding, redness, odor or green/yellow discharge around incision site)    Complete by: As directed    Call MD for:  severe or increased pain, loss or decreased feeling  in affected limb(s)   Complete by: As directed    Call MD for:  temperature >100.5   Complete by: As directed    Discharge patient   Complete by: As directed    Discharge to acute rehab once insurance approved.  Thanks   Discharge disposition: [place] Not Defined   Discharge patient date: 01/28/2022   Discharge wound care:   Complete by: As directed    Shower daily with soap and water.  Keep groin incision clean and dry with placement of dry gauze daily to wick moisture to help prevent wound infection.   Resume previous diet   Complete by: As directed        Discharge Diagnosis:  Leg weakness, bilateral [R29.898] Weakness of both lower extremities [R29.898] Thoracic aortic aneurysm (HCC) [I71.20]  Secondary Diagnosis: Patient Active Problem List   Diagnosis Date Noted   Stage 3 chronic kidney disease ([place])    Leukocytosis    Leg weakness, bilateral 01/17/2022   Thoracic aortic aneurysm ([place]) 01/15/2022   Annual physical exam 11/05/2021   Depression, major, single episode, complete remission ([place]) 06/21/2021   S/P exploratory laparotomy 06/21/2021   Mass of right breast 06/21/2021   Disability examination 06/21/2021   Pressure injury of skin 06/02/2021   Hyperkalemia 06/01/2021   Acute hyperkalemia 06/01/2021   PAF (paroxysmal atrial fibrillation) ([place]) 05/07/2021   Essential hypertension 05/07/2021   Pure hypercholesterolemia 05/07/2021   Debility 04/15/2021   Acute hypoxemic respiratory failure ([place]) 04/07/2021   Malnutrition of moderate degree 04/03/2021   Encounter for postanesthesia care 04/02/2021   Peritonitis ([place]) 04/02/2021   Endotracheally intubated 04/02/2021   AKI (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 04/02/2021   S/P ascending aortic replacement 03/25/2021   Encounter for weaning from ventilator [facility])    Status post surgery 03/24/2021    Tobacco abuse 07/30/2017   History of drug use 07/30/2017   Influenza, pneumonia    Aortic dissection distal to left subclavian ([place]) 05/31/2017   Descending thoracic dissection ([place]) 05/31/2017   Chest pain 05/31/2017   Coronary artery disease due to lipid rich plaque    Acute on chronic diastolic CHF (congestive heart failure) ([place])    Acute coronary syndrome ([place]) 12/23/2015   NSTEMI (non-ST elevated myocardial infarction) ([place]) 12/22/2015   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[place])    Anemia    Chronic kidney disease    Essential hypertension 05/07/2021   GERD (gastroesophageal reflux disease)  Hernia, epigastric    Hypertension    PAF (paroxysmal atrial fibrillation) ([place]) 05/07/2021   Pure hypercholesterolemia 05/07/2021     Allergies as of 01/28/2022       Reactions   Chlorthalidone Other (See Comments)   ELEVATED KIDNEY FUNCTION         Medication List     STOP taking these medications    amLODipine 10 MG tablet Commonly known as: NORVASC   metoprolol tartrate 25 MG tablet Commonly known as: LOPRESSOR   oxyCODONE 5 MG immediate release tablet Commonly known as: Oxy IR/ROXICODONE       TAKE these medications    acetaminophen 325 MG tablet Commonly known as: TYLENOL Take 1-2 tablets (325-650 mg total) by mouth every 4 (four) hours as needed for mild pain. What changed:  how much to take when to take this   amiodarone 200 MG tablet Commonly known as: PACERONE Take 1 tablet (200 mg total) by mouth daily.   aspirin 81 MG chewable tablet Chew 4 tablets (324 mg total) by mouth daily.   atorvastatin 80 MG tablet Commonly known as: LIPITOR Take 1 tablet (80 mg total) by mouth daily.   FeroSul 325 (65 FE) MG tablet Generic drug: ferrous sulfate Take 1 tablet (325 mg total) by mouth daily with breakfast.   multivitamin with minerals Tabs tablet Take 1 tablet by mouth daily.   oxyCODONE-acetaminophen 5-325 MG  tablet Commonly known as: Percocet Take 1 tablet by mouth every 6 (six) hours as needed.   pantoprazole 40 MG tablet Commonly known as: PROTONIX Take 1 tablet (40 mg total) by mouth daily.   sodium bicarbonate 650 MG tablet Take 650 mg by mouth daily.               Discharge Care Instructions  (From admission, onward)           Start     Ordered   01/28/22 0000  Discharge wound care:       Comments: Shower daily with soap and water.  Keep groin incision clean and dry with placement of dry gauze daily to wick moisture to help prevent wound infection.   01/28/22 0709            Prescriptions given: Roxicet #20 No Refill  (prnted Rx on chart)   Instructions: 1.  Shower daily with soap and water.  Wash groin incision with soap and water daily then pat dry.  Otherwise, keep dry gauze over groin incision to wick moisture to help prevent wound infection  Disposition: acute rehab  Patient's condition: is Good  Follow up:  Dr. Trula Slade in 2-3 weeks with non contrast CT scan of the chest.  Our office will arrange appt.    Leontine Locket, PA-C [facility] [phone] 01/28/2022  7:31 AM

## 2022-01-27 NOTE — Progress Notes (Addendum)
  Progress Note    01/27/2022 6:48 AM   Subjective:  says she got out of bed yesterday but did not work with PT b/c they were not able to come by.  Says the rehab in [facility] called her on Friday saying they had a bed.    Afebrile HR 70's-80's 094'M-768'G systolic 88% RA  Vitals:   01/26/22 2006 01/27/22 0508  BP: 120/72 (!) 172/75  Pulse: 74 77  Resp: 18 16  Temp: 97.8 F (36.6 C) 98.8 F (37.1 C)  SpO2: 95% 95%    Physical Exam: Cardiac:  regular Lungs:  non labored Extremities:  palpable bilateral radial pulses and right DP and left PT; lifting legs off the bed; sensory in tact.   CBC    Component Value Date/Time   WBC 19.8 (H) 01/23/2022 0431   RBC 2.29 (L) 01/23/2022 0431   HGB 7.6 (L) 01/23/2022 0431   HCT 23.2 (L) 01/23/2022 0431   PLT 253 01/23/2022 0431   MCV 101.3 (H) 01/23/2022 0431   MCH 33.2 01/23/2022 0431   MCHC 32.8 01/23/2022 0431   RDW 16.6 (H) 01/23/2022 0431   LYMPHSABS 1.7 01/23/2022 0431   MONOABS 2.1 (H) 01/23/2022 0431   EOSABS 0.0 01/23/2022 0431   BASOSABS 0.0 01/23/2022 0431    BMET    Component Value Date/Time   NA 140 01/26/2022 0409   NA 139 12/23/2021 0000   K 4.5 01/26/2022 0409   CL 106 01/26/2022 0409   CO2 24 01/26/2022 0409   GLUCOSE 110 (H) 01/26/2022 0409   BUN 29 (H) 01/26/2022 0409   BUN 24 (A) 12/23/2021 0000   CREATININE 2.13 (H) 01/26/2022 0409   CREATININE 1.69 (H) 08/21/2017 0935   CALCIUM 9.0 01/26/2022 0409   GFRNONAA 26 (L) 01/26/2022 0409   GFRNONAA 33 (L) 08/21/2017 0935   GFRAA 53 (L) 10/07/2017 0720   GFRAA 39 (L) 08/21/2017 0935    INR    Component Value Date/Time   INR 1.2 01/21/2022 0347     Intake/Output Summary (Last 24 hours) at 01/27/2022 0648 Last data filed at 01/26/2022 1400 Gross per 24 hour  Intake --  Output 250 ml  Net -250 ml     Assessment/Plan:  61 y.o. female is s/p:  TEVAR complicated by spinal cord ischemia    -pt with palpable radial and pedal pulses  bilaterally.  Sensory in tact and able to lift legs.   -hopeful for rehab today -DVT prophylaxis:  sq heparin   Leontine Locket, PA-C [facility] [phone] 01/27/2022 6:48 AM  I agree with the above.  She says she is feeling better.  She needs to walk with physical therapy.  She was able to be put in a chair this morning.  We are waiting on approval from CIR, which sounds like it is going to be tomorrow or Wednesday.  Annamarie Major

## 2022-01-28 LAB — GLUCOSE, CAPILLARY
Glucose-Capillary: 134 mg/dL — ABNORMAL HIGH (ref 70–99)
Glucose-Capillary: 139 mg/dL — ABNORMAL HIGH (ref 70–99)
Glucose-Capillary: 82 mg/dL (ref 70–99)
Glucose-Capillary: 110 mg/dL — ABNORMAL HIGH (ref 70–99)

## 2022-01-28 MED ORDER — OXYCODONE-ACETAMINOPHEN 5-325 MG PO TABS: 1.0000 | ORAL_TABLET | Freq: Four times a day (QID) | ORAL | 0 refills | Status: DC | PRN

## 2022-01-28 NOTE — Progress Notes (Addendum)
  Progress Note    01/28/2022 6:44 AM Hospital Day 10  Subjective:  no complaints this morning.  Said she worked with PT yesterday and did well.  Says they are getting her up to the chair.  Looking forward to going to rehab and getting stronger.   Afebrile HR 60's-70's  007'H-219'X systolic 58% RA  Vitals:   01/27/22 2002 01/28/22 0451  BP: (!) 160/76 (!) 150/85  Pulse: 75 77  Resp: 19 20  Temp: 98.6 F (37 C) 98.7 F (37.1 C)  SpO2: 98% 98%    Physical Exam: General:  no distress Lungs:  non labored Extremities:  palpable left PT and right DP pulses.    CBC    Component Value Date/Time   WBC 19.8 (H) 01/23/2022 0431   RBC 2.29 (L) 01/23/2022 0431   HGB 7.6 (L) 01/23/2022 0431   HCT 23.2 (L) 01/23/2022 0431   PLT 253 01/23/2022 0431   MCV 101.3 (H) 01/23/2022 0431   MCH 33.2 01/23/2022 0431   MCHC 32.8 01/23/2022 0431   RDW 16.6 (H) 01/23/2022 0431   LYMPHSABS 1.7 01/23/2022 0431   MONOABS 2.1 (H) 01/23/2022 0431   EOSABS 0.0 01/23/2022 0431   BASOSABS 0.0 01/23/2022 0431    BMET    Component Value Date/Time   NA 140 01/26/2022 0409   NA 139 12/23/2021 0000   K 4.5 01/26/2022 0409   CL 106 01/26/2022 0409   CO2 24 01/26/2022 0409   GLUCOSE 110 (H) 01/26/2022 0409   BUN 29 (H) 01/26/2022 0409   BUN 24 (A) 12/23/2021 0000   CREATININE 2.13 (H) 01/26/2022 0409   CREATININE 1.69 (H) 08/21/2017 0935   CALCIUM 9.0 01/26/2022 0409   GFRNONAA 26 (L) 01/26/2022 0409   GFRNONAA 33 (L) 08/21/2017 0935   GFRAA 53 (L) 10/07/2017 0720   GFRAA 39 (L) 08/21/2017 0935    INR    Component Value Date/Time   INR 1.2 01/21/2022 0347     Intake/Output Summary (Last 24 hours) at 01/28/2022 0644 Last data filed at 01/27/2022 2033 Gross per 24 hour  Intake 130 ml  Output 1500 ml  Net -1370 ml     Assessment/Plan:  61 y.o. female who is s/p TEVAR complicated by spinal cord ischemia    Hospital Day 10  -pt continues to have palpable pulses.  Her left groin  incision is healing nicely.  -hopeful for acute rehab today.   -given spinal cord ischemia, allowing permissive hypertension.  At this time, will hold on restarting antihypertensive medications.   -awaiting insurance approval for acute rehab at [facility]. -she will f/u in 2-3 weeks with non contrast chest CT   Leontine Locket, PA-C [facility] [phone] 01/28/2022 6:44 AM   I agree with the above.  We are awaiting discharge to [place]

## 2022-01-28 NOTE — TOC Progression Note (Addendum)
Transition of Care [facility]) - Progression Note    Patient Details  Name: Dorothy Harris MRN: [id number] Date of Birth: [birth year]  Transition of [facility]) CM/SW [place], RN Phone Number: 01/28/2022, 10:48 AM  Clinical Narrative:    CM called and spoke with Jeannene Patella, Admissions CM at [facility] in [facility] and insurance authorization is still pending for patient's placement at the facility.  Updated progress notes, OT notes and discharge summary sent to the facility in case insurance provider needed updated  OT notes.  CM will continue to follow the patient for patient's placement at Inpatient rehab  - pending insurance authorization at this time.  01/28/2022 1448 - I received a call from the patient's insurance provider that the patient was approved for Inpatient Rehabilitation services in [place], [place].  I called and left a message with Mechele Dawley, CM at [facility] and I'm waiting to receive return call if patient is able to discharge to the facility today / tomorrow.  01/28/2022 1536 - I called and spoke with Pam at [place] rehab and they received insurance approval but will not have an available admission bed until the am, 01/29/22.  Leontine Locket, PA was updated to placed updated discharge summary for the am and I will arrange transportation by [facility] in the am.  Bedside nursing is aware.   Expected Discharge Plan: [place] Barriers to Discharge: Continued Medical Work up  Expected Discharge Plan and Services Expected Discharge Plan: [place] In-house Referral: NA Discharge Planning Services: CM Consult   Living arrangements for the past 2 months: Single Family Home Expected Discharge Date: 01/28/22                                     Social Determinants of Health (SDOH) Interventions    Readmission Risk Interventions    01/22/2022   12:57 PM  06/06/2021    1:23 PM 04/15/2021    1:58 PM  Readmission Risk Prevention Plan  Transportation Screening Complete Complete   PCP or Specialist Appt within 3-5 Days Complete    Home Care Screening   Complete  Medication Review (RN CM)   Complete  HRI or Home Care Consult Complete    Social Work Consult for [place] Planning/Counseling Complete    Palliative Care Screening Not Applicable    Medication Review Press photographer) Referral to Pharmacy Complete   PCP or Specialist appointment within 3-5 days of discharge  Complete   HRI or [place]  Complete   [place]  Not Applicable

## 2022-01-28 NOTE — Progress Notes (Signed)
Occupational Therapy Treatment Patient Details Name: Dorothy Harris MRN: [id number] DOB: 05-21-1960 Today's Date: 01/28/2022   History of present illness 61yo female admitted 10/13 with bil LE weakness with CSF leak. lumbar drain 10/13-10/18. Pt s/p thoracic aneurysm repair 10/11 with D/c 10/12. PMhx: HTN, HLD, colon resection s/p ileostomy   OT comments  Pt making incremental progress towards OT goals though limited by pain in B feet. Pt able to progress standing transfers to min guard with RW though continues to benefit from RW mgmt assist with mobility tasks. Planned to progress bathroom mobility but due to pain, pt opted for [facility] transfers today. Pt continues to require hands on assist for LB ADL mgmt but improving with each session. Pt hopeful to DC to [place]'s inpt rehab today.   Recommendations for follow up therapy are one component of a multi-disciplinary discharge planning process, led by the attending physician.  Recommendations may be updated based on patient status, additional functional criteria and insurance authorization.    Follow Up Recommendations  Acute inpatient rehab (3hours/day)    Assistance Recommended at Discharge Intermittent Supervision/Assistance  Patient can return home with the following  A lot of help with bathing/dressing/bathroom;A little help with walking and/or transfers;Assistance with cooking/housework   Equipment Recommendations  None recommended by OT    Recommendations for Other Services      Precautions / Restrictions Precautions Precautions: Fall;Other (comment) Precaution Comments: ostomy Restrictions Weight Bearing Restrictions: No       Mobility Bed Mobility Overal bed mobility: Modified Independent Bed Mobility: Supine to Sit                Transfers Overall transfer level: Needs assistance Equipment used: Rolling walker (2 wheels) Transfers: Sit to/from Stand Sit to Stand: Min guard, From elevated surface            General transfer comment: min guard from elevated bed, see toilet transfers for details     Balance Overall balance assessment: Needs assistance Sitting-balance support: No upper extremity supported, Feet supported Sitting balance-Leahy Scale: Good     Standing balance support: Bilateral upper extremity supported, Reliant on assistive device for balance Standing balance-Leahy Scale: Poor                             ADL either performed or assessed with clinical judgement   ADL Overall ADL's : Needs assistance/impaired     Grooming: Modified independent;Sitting;Wash/dry face Grooming Details (indicate cue type and reason): declined to stand due to pain in bottom of feet                 Toilet Transfer: Minimal assistance;Stand-pivot;BSC/3in1;Rolling walker (2 wheels) Toilet Transfer Details (indicate cue type and reason): assist for RW mgmt. Attempted bathroom mobility but pt became frustrated with pain in B feet, requested bedpan instead but able to redirect to [facility] (pulled it behind pt). Able to stand from [facility] pushing through armrests Toileting- Clothing Manipulation and Hygiene: Minimal assistance;Sitting/lateral lean;Sit to/from stand Toileting - Clothing Manipulation Details (indicate cue type and reason): assist for clothing mgmt, able to perform hygiene in standing     Functional mobility during ADLs: Minimal assistance;Rolling walker (2 wheels) General ADL Comments: Limited by pain in B bottom of feet    Extremity/Trunk Assessment Upper Extremity Assessment Upper Extremity Assessment: Generalized weakness   Lower Extremity Assessment Lower Extremity Assessment: Defer to PT evaluation        Vision  Vision Assessment?: No apparent visual deficits   Perception     Praxis      Cognition Arousal/Alertness: Awake/alert Behavior During Therapy: WFL for tasks assessed/performed Overall Cognitive Status: Within Functional Limits for tasks  assessed                                          Exercises      Shoulder Instructions       General Comments      Pertinent Vitals/ Pain       Pain Assessment Pain Assessment: Faces Faces Pain Scale: Hurts a little bit Pain Location: bil feet Pain Descriptors / Indicators: Sore Pain Intervention(s): Monitored during session, Premedicated before session  Home Living                                          Prior Functioning/Environment              Frequency  Min 2X/week        Progress Toward Goals  OT Goals(current goals can now be found in the care plan section)  Progress towards OT goals: Progressing toward goals  Acute Rehab OT Goals Patient Stated Goal: pain control in feet OT Goal Formulation: With patient Time For Goal Achievement: 02/07/22 Potential to Achieve Goals: Good ADL Goals Pt Will Perform Grooming: with modified independence;standing Pt Will Perform Lower Body Bathing: with set-up;sit to/from stand;sitting/lateral leans Pt Will Perform Lower Body Dressing: with set-up;sitting/lateral leans;sit to/from stand Pt Will Transfer to Toilet: with supervision;ambulating  Plan Discharge plan remains appropriate    Co-evaluation                 AM-PAC OT "6 Clicks" Daily Activity     Outcome Measure   Help from another person eating meals?: None Help from another person taking care of personal grooming?: A Little Help from another person toileting, which includes using toliet, bedpan, or urinal?: A Little Help from another person bathing (including washing, rinsing, drying)?: A Lot Help from another person to put on and taking off regular upper body clothing?: A Little Help from another person to put on and taking off regular lower body clothing?: A Lot 6 Click Score: 17    End of Session Equipment Utilized During Treatment: Rolling walker (2 wheels);Gait belt  OT Visit Diagnosis: Unsteadiness on  feet (R26.81);Other abnormalities of gait and mobility (R26.89);Muscle weakness (generalized) (M62.81)   Activity Tolerance Patient tolerated treatment well;Patient limited by pain   Patient Left in chair;with call bell/phone within reach   Nurse Communication Mobility status        Time: 5956-3875 OT Time Calculation (min): 17 min  Charges: OT General Charges $OT Visit: 1 Visit OT Treatments $Self Care/Home Management : 8-22 mins  Malachy Chamber, OTR/L Acute Rehab Services Office: [phone]   Layla Maw 01/28/2022, 7:58 AM

## 2022-01-29 DIAGNOSIS — S31109A Unspecified open wound of abdominal wall, unspecified quadrant without penetration into peritoneal cavity, initial encounter: Secondary | ICD-10-CM | POA: Insufficient documentation

## 2022-01-29 DIAGNOSIS — D649 Anemia, unspecified: Secondary | ICD-10-CM | POA: Insufficient documentation

## 2022-01-29 DIAGNOSIS — R739 Hyperglycemia, unspecified: Secondary | ICD-10-CM | POA: Insufficient documentation

## 2022-01-29 DIAGNOSIS — G8222 Paraplegia, incomplete: Secondary | ICD-10-CM | POA: Insufficient documentation

## 2022-01-29 DIAGNOSIS — Z8701 Personal history of pneumonia (recurrent): Secondary | ICD-10-CM | POA: Insufficient documentation

## 2022-01-29 DIAGNOSIS — Z789 Other specified health status: Secondary | ICD-10-CM | POA: Insufficient documentation

## 2022-01-29 LAB — CBC
HCT: 26.4 % — ABNORMAL LOW (ref 36.0–46.0)
Hemoglobin: 8.1 g/dL — ABNORMAL LOW (ref 12.0–15.0)
MCH: 32.1 pg (ref 26.0–34.0)
MCHC: 30.7 g/dL (ref 30.0–36.0)
MCV: 104.8 fL — ABNORMAL HIGH (ref 80.0–100.0)
Platelets: 415 10*3/uL — ABNORMAL HIGH (ref 150–400)
RBC: 2.52 MIL/uL — ABNORMAL LOW (ref 3.87–5.11)
RDW: 15.9 % — ABNORMAL HIGH (ref 11.5–15.5)
WBC: 16.8 10*3/uL — ABNORMAL HIGH (ref 4.0–10.5)
nRBC: 0.2 % (ref 0.0–0.2)

## 2022-01-29 LAB — GLUCOSE, CAPILLARY
Glucose-Capillary: 99 mg/dL (ref 70–99)
Glucose-Capillary: 121 mg/dL — ABNORMAL HIGH (ref 70–99)

## 2022-01-29 LAB — BASIC METABOLIC PANEL
Anion gap: 10 (ref 5–15)
BUN: 20 mg/dL (ref 6–20)
CO2: 25 mmol/L (ref 22–32)
Calcium: 9.1 mg/dL (ref 8.9–10.3)
Chloride: 106 mmol/L (ref 98–111)
Creatinine, Ser: 1.97 mg/dL — ABNORMAL HIGH (ref 0.44–1.00)
GFR, Estimated: 29 mL/min — ABNORMAL LOW (ref >60)
Glucose, Bld: 120 mg/dL — ABNORMAL HIGH (ref 70–99)
Potassium: 4.7 mmol/L (ref 3.5–5.1)
Sodium: 141 mmol/L (ref 135–145)

## 2022-01-29 NOTE — TOC Progression Note (Addendum)
Transition of Care [facility]) - Progression Note    Patient Details  Name: Dorothy Harris MRN: [id number] Date of Birth: [birth year]  Transition of Care [facility]) CM/SW [place], RN Phone Number: 01/29/2022, 8:29 AM  Clinical Narrative:    CM called and spoke with Claudia Pollock, Admission director with [facility] called and states the attending physician, Dr. Electa Sniff, would like to speak with the attending vascular surgeon, Dr. Trula Slade this morning to discuss concerns over the patient's last noted WBC and Hgb level noted on 01/23/2022.  Dr. Abby Potash number [phone] was given to Leontine Locket, PA to forward to Dr. Trula Slade this morning.   01/29/2022 1120 - Cm called and spoke with Lelon Frohlich, CM at [facility] and the facility has spoken with the attending physician and they are willing to accept the patient this morning.  The patient was updated.  PTAR transport will be arranged.  Bedside nursing - please call report to [facility] inpatient rehabilitation at [phone].  CM will continue to follow the patient for Inpatient Rehabilitation placement.    Expected Discharge Plan: [place] Barriers to Discharge: Continued Medical Work up  Expected Discharge Plan and Services Expected Discharge Plan: [place] In-house Referral: NA Discharge Planning Services: CM Consult   Living arrangements for the past 2 months: Single Family Home Expected Discharge Date: 01/29/22                                     Social Determinants of Health (SDOH) Interventions    Readmission Risk Interventions    01/22/2022   12:57 PM 06/06/2021    1:23 PM 04/15/2021    1:58 PM  Readmission Risk Prevention Plan  Transportation Screening Complete Complete   PCP or Specialist Appt within 3-5 Days Complete    Home Care Screening   Complete   Medication Review (RN CM)   Complete  HRI or Home Care Consult Complete    Social Work Consult for [place] Planning/Counseling Complete    Palliative Care Screening Not Applicable    Medication Review Press photographer) Referral to Pharmacy Complete   PCP or Specialist appointment within 3-5 days of discharge  Complete   HRI or [place]  Complete   [place]  Not Applicable

## 2022-01-29 NOTE — Progress Notes (Addendum)
[facility] [place]  Subjective  - No new concerns   Objective (!) 178/90 78 98.4 F (36.9 C) (Oral) 17 92%  Intake/Output Summary (Last 24 hours) at 01/29/2022 0706 Last data filed at 01/29/2022 0600 Gross per 24 hour  Intake 220 ml  Output 300 ml  Net -80 ml    Left groin soft and healing well Palpable right DP, left PT pulses  Lungs non labored breathing   Assessment/Planning: POD # [id number] 60 y.o. female who is s/p TEVAR complicated by spinal cord ischemia     Spinal cord symptoms improved.  Allowing hypertension within reason to maintained spinal cord perfusion. Plan to discharge today to in patient rehab.  she will f/u in 2-3 weeks with non contrast chest CT   Dorothy Harris 01/29/2022 7:06 AM --  Laboratory Lab Results: No results for input(s): "WBC", "HGB", "HCT", "PLT" in the last 72 hours. BMET No results for input(s): "NA", "K", "CL", "CO2", "GLUCOSE", "BUN", "CREATININE", "CALCIUM" in the last 72 hours.  COAG Lab Results  Component Value Date   INR 1.2 01/21/2022   INR 1.3 (H) 01/18/2022   INR 1.3 (H) 01/18/2022   No results found for: "PTT"   I agree with the above.  Plan for d/c to CIR today.  Continue to hold BP meds  Dorothy Harris Dorothy Harris

## 2022-02-17 ENCOUNTER — Ambulatory Visit (INDEPENDENT_AMBULATORY_CARE_PROVIDER_SITE_OTHER): Payer: PRIVATE HEALTH INSURANCE | Admitting: Surgery

## 2022-02-17 ENCOUNTER — Encounter: Payer: Self-pay | Admitting: Surgery

## 2022-02-17 VITALS — BP 164/89 | HR 69 | Temp 97.9°F | Resp 20 | Ht 67.0 in | Wt 181.0 lb

## 2022-02-17 DIAGNOSIS — I7143 Infrarenal abdominal aortic aneurysm, without rupture: Secondary | ICD-10-CM

## 2022-02-17 DIAGNOSIS — I7123 Aneurysm of the descending thoracic aorta, without rupture: Secondary | ICD-10-CM

## 2022-02-17 NOTE — Progress Notes (Signed)
Patient name: Dorothy Harris MRN: [id number] DOB: [birth year] Sex: female  REASON FOR VISIT:     Post op  HISTORY OF PRESENT ILLNESS:   Dorothy Harris is a 61 y.o. 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 Cyndia Bent on 03/25/2021.  Her postoperative course was complicated by an incarcerated hernia with colon necrosis and perforation which required exploratory laparotomy, colon resection, and ileostomy with hernia repair.  She progressed to stage IIIb renal insufficiency with a creatinine around 2.4.  Her thoracic aorta was found to be dilated to 6 cm.  We discussed proceeding with repair.  On 01/15/2022, she underwent stent graft repair.  This was complicated by a groin issue from the [place] which required endarterectomy and patch angioplasty.  She did great initially and went home on postoperative day 1, however that evening she developed leg weakness.  She contacted EMS, but was not brought to the hospital.  The following morning she had worsening leg weakness and presented to the hospital.  She was transferred emergently to [facility] where a lumbar drain was placed in addition to increasing her blood pressure.  Over the course of the next several days, she began regaining function of her legs.  She was ultimately discharged to rehab.  He is back today, feeling like she is very close to her baseline.  She wants to go back to work  The patient is medically managed for hypertension.  She is a smoker.  She takes a statin for hypercholesterolemia.  She is medically managed for hypertension.  CURRENT MEDICATIONS:    Current Outpatient Medications  Medication Sig Dispense Refill   acetaminophen (TYLENOL) 325 MG tablet Take 1-2 tablets (325-650 mg total) by mouth every 4 (four) hours as needed for mild pain. (Patient taking differently: Take 650 mg by mouth 3 (three)  times daily as needed for mild pain.)     amiodarone (PACERONE) 200 MG tablet Take 1 tablet (200 mg total) by mouth daily. 90 tablet 2   amLODipine (NORVASC) 5 MG tablet Take 1 tablet by mouth daily.     aspirin 81 MG chewable tablet Chew 4 tablets (324 mg total) by mouth daily.     atorvastatin (LIPITOR) 80 MG tablet Take 1 tablet (80 mg total) by mouth daily. 90 tablet 2   ferrous sulfate 325 (65 FE) MG tablet Take 1 tablet (325 mg total) by mouth daily with breakfast. 30 tablet 0   metoprolol tartrate (LOPRESSOR) 25 MG tablet Take by mouth.     Multiple Vitamin (MULTIVITAMIN WITH MINERALS) TABS tablet Take 1 tablet by mouth daily.     omeprazole (PRILOSEC) 20 MG capsule Take by mouth.     oxyCODONE (OXY IR/ROXICODONE) 5 MG immediate release tablet Take by mouth.     sodium bicarbonate 650 MG tablet Take 650 mg by mouth daily.     tamsulosin (FLOMAX) 0.4 MG CAPS capsule Take 0.4 mg by mouth.     No current facility-administered medications for this visit.    REVIEW OF SYSTEMS:   '[X]'$  denotes positive finding, $RemoveBeforeDEI'[ ]'[id number]$  denotes negative finding Cardiac  Comments:  Chest pain or chest pressure:    Shortness of breath upon exertion:    Short of breath when lying flat:    Irregular heart rhythm:    Constitutional    Fever or chills:      PHYSICAL EXAM:   Vitals:   02/17/22 0850  BP: (!) 164/89  Pulse: 69  Resp: 20  Temp: 97.9 F (36.6 C)  SpO2: 98%  Weight: 181 lb (82.1 kg)  Height: $Remove'5\' 7"'RCIKLdj$  (1.702 m)    GENERAL: The patient is a well-nourished female, in no acute distress. The vital signs are documented above. CARDIOVASCULAR: There is a regular rate and rhythm. PULMONARY: Non-labored respirations Left groin incision is completely healed  STUDIES:   None   MEDICAL ISSUES:   Thoracic aneurysm: I will have her return in 6 months with a noncontrast CT scan for follow-up.  She has nearly recovered from her spinal cord issues.  She wants to go back to work.  I think she can go  back without restrictions  AAA: Maximum previous diameter was 4.8 cm.  I will image this in 6 months with her CT scan.  I plan on delaying repair until it is greater than 5 cm given her recurrent risk for spinal cord ischemia.  Leia Alf, MD, FACS [facility] [phone] Pager [phone]

## 2022-02-19 ENCOUNTER — Ambulatory Visit (INDEPENDENT_AMBULATORY_CARE_PROVIDER_SITE_OTHER): Payer: PRIVATE HEALTH INSURANCE | Admitting: Internal Medicine

## 2022-02-19 ENCOUNTER — Encounter: Payer: Self-pay | Admitting: Internal Medicine

## 2022-02-19 VITALS — BP 132/78 | HR 81 | Ht 67.0 in | Wt 181.8 lb

## 2022-02-19 DIAGNOSIS — M109 Gout, unspecified: Secondary | ICD-10-CM | POA: Diagnosis not present

## 2022-02-19 DIAGNOSIS — Z09 Encounter for follow-up examination after completed treatment for conditions other than malignant neoplasm: Secondary | ICD-10-CM

## 2022-02-19 MED ORDER — DICLOFENAC SODIUM 1 % EX GEL: 2.0000 g | Freq: Four times a day (QID) | CUTANEOUS | 5 refills | Status: DC

## 2022-02-19 NOTE — Patient Instructions (Signed)
It was a pleasure to see you today.  Thank you for giving [place] the opportunity to be involved in your care.  Below is a brief recap of your visit and next steps.  We will plan to see you again in February  Summary I am glad to see things are going well today. Please let us know if there is anything you need. We will see you for follow up in February.
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
## 2022-02-20 DIAGNOSIS — Z09 Encounter for follow-up examination after completed treatment for conditions other than malignant neoplasm: Secondary | ICD-10-CM | POA: Insufficient documentation

## 2022-02-20 NOTE — Assessment & Plan Note (Addendum)
Presenting today for hospital follow-up in the setting of a recent hospital admission following endovascular repair of TAA (35/33) which was complicated by spinal cord ischemia.  Her symptoms have now resolved.  She has been seen in follow-up by vascular surgery on 11/13 and can return to work without restrictions.  She has no additional concerns to discuss today.  Voltaren gel was refilled at her request.  We will plan for follow-up in 3 months.

## 2022-02-25 ENCOUNTER — Encounter (HOSPITAL_BASED_OUTPATIENT_CLINIC_OR_DEPARTMENT_OTHER): Payer: Self-pay
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
## 2022-03-06 ENCOUNTER — Ambulatory Visit (HOSPITAL_BASED_OUTPATIENT_CLINIC_OR_DEPARTMENT_OTHER): Payer: PRIVATE HEALTH INSURANCE | Admitting: Cardiovascular Disease

## 2022-03-17 DIAGNOSIS — N189 Chronic kidney disease, unspecified: Secondary | ICD-10-CM | POA: Diagnosis not present

## 2022-03-17 DIAGNOSIS — N2581 Secondary hyperparathyroidism of renal origin: Secondary | ICD-10-CM | POA: Diagnosis not present

## 2022-03-17 DIAGNOSIS — N184 Chronic kidney disease, stage 4 (severe): Secondary | ICD-10-CM | POA: Diagnosis not present

## 2022-03-20 ENCOUNTER — Other Ambulatory Visit: Payer: Self-pay

## 2022-03-20 DIAGNOSIS — D631 Anemia in chronic kidney disease: Secondary | ICD-10-CM | POA: Insufficient documentation

## 2022-04-01 ENCOUNTER — Telehealth: Payer: Self-pay | Admitting: Pharmacy Technician

## 2022-04-01 NOTE — Telephone Encounter (Addendum)
Dr. Marval Regal,  Auth Submission: DENIED Payer: [place] (COMMERICAL insurance) Medication & CPT/J Code(s) submitted: Feraheme (ferumoxytol) [id number] Route of submission (phone, fax, portal):  Phone # [phone] Fax # [phone] Auth type: Buy/Bill Units/visits requested: X2 Reference number: [id number] Please refer patient to [place]: [place] has been DENIED because: [facility] is  not participating provider with insurance. Requested an exception with Supervisor Terain-S and it was denied as well. Patient will need to f/u with insurance to get a list of participating providers.  Referral will cancelled.

## 2022-04-04 ENCOUNTER — Encounter (HOSPITAL_COMMUNITY): Payer: Self-pay | Admitting: Nephrology

## 2022-04-11 ENCOUNTER — Encounter (HOSPITAL_COMMUNITY): Payer: PRIVATE HEALTH INSURANCE

## 2022-04-28 ENCOUNTER — Encounter: Payer: Self-pay | Admitting: Cardiovascular Disease

## 2022-05-08 ENCOUNTER — Encounter: Payer: Self-pay | Admitting: Internal Medicine

## 2022-05-08 ENCOUNTER — Ambulatory Visit (INDEPENDENT_AMBULATORY_CARE_PROVIDER_SITE_OTHER): Payer: Commercial Managed Care - PPO | Admitting: Internal Medicine

## 2022-05-08 ENCOUNTER — Encounter (HOSPITAL_COMMUNITY): Payer: Self-pay | Admitting: Nephrology

## 2022-05-08 VITALS — BP 168/94 | HR 62 | Ht 67.0 in | Wt 181.0 lb

## 2022-05-08 DIAGNOSIS — D509 Iron deficiency anemia, unspecified: Secondary | ICD-10-CM | POA: Diagnosis not present

## 2022-05-08 DIAGNOSIS — I714 Abdominal aortic aneurysm, without rupture, unspecified: Secondary | ICD-10-CM | POA: Diagnosis not present

## 2022-05-08 DIAGNOSIS — N189 Chronic kidney disease, unspecified: Secondary | ICD-10-CM

## 2022-05-08 DIAGNOSIS — D631 Anemia in chronic kidney disease: Secondary | ICD-10-CM

## 2022-05-08 DIAGNOSIS — N1831 Chronic kidney disease, stage 3a: Secondary | ICD-10-CM

## 2022-05-08 DIAGNOSIS — I712 Thoracic aortic aneurysm, without rupture, unspecified: Secondary | ICD-10-CM

## 2022-05-08 DIAGNOSIS — I480 Paroxysmal atrial fibrillation: Secondary | ICD-10-CM

## 2022-05-08 DIAGNOSIS — I2510 Atherosclerotic heart disease of native coronary artery without angina pectoris: Secondary | ICD-10-CM

## 2022-05-08 DIAGNOSIS — I2583 Coronary atherosclerosis due to lipid rich plaque: Secondary | ICD-10-CM

## 2022-05-08 DIAGNOSIS — M545 Low back pain, unspecified: Secondary | ICD-10-CM | POA: Diagnosis not present

## 2022-05-08 DIAGNOSIS — I10 Essential (primary) hypertension: Secondary | ICD-10-CM

## 2022-05-08 DIAGNOSIS — Z0001 Encounter for general adult medical examination with abnormal findings: Secondary | ICD-10-CM | POA: Diagnosis not present

## 2022-05-08 DIAGNOSIS — E611 Iron deficiency: Secondary | ICD-10-CM | POA: Insufficient documentation

## 2022-05-08 MED ORDER — CYCLOBENZAPRINE HCL 5 MG PO TABS: 5.0000 mg | ORAL_TABLET | ORAL | 0 refills | Status: DC | PRN

## 2022-05-08 MED ORDER — AMLODIPINE BESYLATE 10 MG PO TABS: 10.0000 mg | ORAL_TABLET | Freq: Every day | ORAL | 2 refills | Status: DC

## 2022-05-08 NOTE — Assessment & Plan Note (Signed)
Per previous cardiology documentation, she has a history of postoperative atrial fibrillation for which she was started on amiodarone and metoprolol.  Not currently on any anticoagulation.  The initial plan was to discontinue amiodarone as of March 2023, however it appears this was continued in the setting of her hospitalization last fall.  RRR exam today. -No medication changes today.  Will defer discontinuation of amiodarone to cardiology discretion.  She was provided with contact information for cardiology today to arrange a follow-up appointment.

## 2022-05-08 NOTE — Assessment & Plan Note (Signed)
Previous history of CKD 3.  Last GFR 29 during hospital admission in October 2023. -Repeat labs ordered today

## 2022-05-08 NOTE — Assessment & Plan Note (Signed)
Repeat labs ordered today

## 2022-05-08 NOTE — Assessment & Plan Note (Signed)
Followed by vascular surgery, last seen in November 2023.  Most recent maximal diameter measured at 4.8 cm. -Repeat imaging to be completed this spring

## 2022-05-08 NOTE — Assessment & Plan Note (Signed)
Currently on iron supplementation.  Repeat iron studies been ordered today.

## 2022-05-08 NOTE — Assessment & Plan Note (Signed)
She endorses a 2-week history of left lumbar back pain.  No red flag symptoms are identified.  ROM is generally intact.  There is tenderness palpation over the paraspinal muscles of the left lumbar region. -I have recommended continued use of Voltaren gel and Tylenol -Flexeril has been prescribed today for as needed pain relief

## 2022-05-08 NOTE — Assessment & Plan Note (Signed)
Denies recent chest pain. -Continue ASA 81 mg daily and atorvastatin 80 mg daily.

## 2022-05-08 NOTE — Assessment & Plan Note (Signed)
She is currently prescribed amlodipine 5 mg daily and metoprolol 25 mg twice daily for treatment of hypertension.  Her blood pressure today was significantly elevated, 171/85 initially and 168/94 on repeat. -Increase amlodipine to 10 mg daily and continue metoprolol 25 mg twice daily -Follow-up in 2 weeks for BP check
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
## 2022-05-08 NOTE — Assessment & Plan Note (Signed)
S/p endovascular repair in October 2023.  Followed by vascular surgery.

## 2022-05-08 NOTE — Patient Instructions (Addendum)
It was a pleasure to see you today.  Thank you for giving [place] the opportunity to be involved in your care.  Below is a brief recap of your visit and next steps.  We will plan to see you again in 2 weeks.  Summary Increase amlodipine to 10 mg daily Repeat labs ordered today We will follow up in 2 weeks to check your blood pressure Please see the contact information for cardiology to call and schedule follow up: [phone]

## 2022-05-09 ENCOUNTER — Other Ambulatory Visit: Payer: Self-pay | Admitting: Internal Medicine

## 2022-05-09 DIAGNOSIS — E559 Vitamin D deficiency, unspecified: Secondary | ICD-10-CM

## 2022-05-09 MED ORDER — VITAMIN D (ERGOCALCIFEROL) 1.25 MG (50000 UNIT) PO CAPS: 50000.0000 [IU] | ORAL_CAPSULE | ORAL | 0 refills | Status: AC

## 2022-05-10 LAB — CMP14+EGFR
ALT: 40 IU/L — ABNORMAL HIGH (ref 0–32)
AST: 39 IU/L (ref 0–40)
Albumin/Globulin Ratio: 1.5 (ref 1.2–2.2)
Albumin: 4.3 g/dL (ref 3.9–4.9)
Alkaline Phosphatase: 133 IU/L — ABNORMAL HIGH (ref 44–121)
BUN/Creatinine Ratio: 13 (ref 12–28)
BUN: 27 mg/dL (ref 8–27)
Bilirubin Total: 0.4 mg/dL (ref 0.0–1.2)
CO2: 14 mmol/L — ABNORMAL LOW (ref 20–29)
Calcium: 8.8 mg/dL (ref 8.7–10.3)
Chloride: 114 mmol/L — ABNORMAL HIGH (ref 96–106)
Creatinine, Ser: 2.05 mg/dL — ABNORMAL HIGH (ref 0.57–1.00)
Globulin, Total: 2.8 g/dL (ref 1.5–4.5)
Glucose: 94 mg/dL (ref 70–99)
Potassium: 5.7 mmol/L — ABNORMAL HIGH (ref 3.5–5.2)
Sodium: 143 mmol/L (ref 134–144)
Total Protein: 7.1 g/dL (ref 6.0–8.5)
eGFR: 27 mL/min/{1.73_m2} — ABNORMAL LOW (ref >59)

## 2022-05-10 LAB — TSH+FREE T4
Free T4: 1.28 ng/dL (ref 0.82–1.77)
TSH: 1.25 u[IU]/mL (ref 0.450–4.500)

## 2022-05-10 LAB — LIPID PANEL
Chol/HDL Ratio: 2 ratio (ref 0.0–4.4)
Cholesterol, Total: 129 mg/dL (ref 100–199)
HDL: 64 mg/dL (ref >39)
LDL Chol Calc (NIH): 49 mg/dL (ref 0–99)
Triglycerides: 85 mg/dL (ref 0–149)
VLDL Cholesterol Cal: 16 mg/dL (ref 5–40)

## 2022-05-10 LAB — CBC WITH DIFFERENTIAL/PLATELET
Basophils Absolute: 0.1 10*3/uL (ref 0.0–0.2)
Basos: 1 %
EOS (ABSOLUTE): 0.3 10*3/uL (ref 0.0–0.4)
Eos: 3 %
Hematocrit: 32.7 % — ABNORMAL LOW (ref 34.0–46.6)
Hemoglobin: 10.2 g/dL — ABNORMAL LOW (ref 11.1–15.9)
Immature Grans (Abs): 0 10*3/uL (ref 0.0–0.1)
Immature Granulocytes: 0 %
Lymphocytes Absolute: 1.6 10*3/uL (ref 0.7–3.1)
Lymphs: 21 %
MCH: 30.6 pg (ref 26.6–33.0)
MCHC: 31.2 g/dL — ABNORMAL LOW (ref 31.5–35.7)
MCV: 98 fL — ABNORMAL HIGH (ref 79–97)
Monocytes Absolute: 0.7 10*3/uL (ref 0.1–0.9)
Monocytes: 9 %
Neutrophils Absolute: 5.3 10*3/uL (ref 1.4–7.0)
Neutrophils: 66 %
Platelets: 307 10*3/uL (ref 150–450)
RBC: 3.33 x10E6/uL — ABNORMAL LOW (ref 3.77–5.28)
RDW: 18.2 % — ABNORMAL HIGH (ref 11.7–15.4)
WBC: 8 10*3/uL (ref 3.4–10.8)

## 2022-05-10 LAB — IRON,TIBC AND FERRITIN PANEL
Ferritin: 331 ng/mL — ABNORMAL HIGH (ref 15–150)
Iron Saturation: 26 % (ref 15–55)
Iron: 64 ug/dL (ref 27–139)
Total Iron Binding Capacity: 250 ug/dL (ref 250–450)
UIBC: 186 ug/dL (ref 118–369)

## 2022-05-10 LAB — B12 AND FOLATE PANEL
Folate: 5.8 ng/mL (ref >3.0)
Vitamin B-12: 485 pg/mL (ref 232–1245)

## 2022-05-10 LAB — VITAMIN D 25 HYDROXY (VIT D DEFICIENCY, FRACTURES): Vit D, 25-Hydroxy: 10.2 ng/mL — ABNORMAL LOW (ref 30.0–100.0)

## 2022-05-15 ENCOUNTER — Telehealth (HOSPITAL_BASED_OUTPATIENT_CLINIC_OR_DEPARTMENT_OTHER): Payer: Self-pay | Admitting: Family

## 2022-05-15 NOTE — Telephone Encounter (Signed)
Left message for patient to call and discuss her 05/19/22 8:25 am appointment with Laurann Montana, NP

## 2022-05-16 NOTE — Telephone Encounter (Signed)
Spoke with patient to reschedule the Monday 05/19/22 8:25 appt with Laurann Montana, NP---new appointment date and time is Thursday 05/29/22 at  8:25 am.  Will mail new information to patient and she voiced her understanding.

## 2022-05-16 NOTE — Telephone Encounter (Signed)
Left message for patient to call and discuss rescheduling the Monday 05/19/22 8:25 am appt with Laurann Montana, NP

## 2022-05-19 ENCOUNTER — Ambulatory Visit (HOSPITAL_BASED_OUTPATIENT_CLINIC_OR_DEPARTMENT_OTHER): Payer: Commercial Managed Care - PPO | Admitting: Family

## 2022-05-23 ENCOUNTER — Ambulatory Visit (INDEPENDENT_AMBULATORY_CARE_PROVIDER_SITE_OTHER): Payer: Commercial Managed Care - PPO | Admitting: Internal Medicine

## 2022-05-23 ENCOUNTER — Encounter: Payer: Self-pay | Admitting: Internal Medicine

## 2022-05-23 VITALS — BP 148/83 | HR 71 | Ht 67.0 in | Wt 178.8 lb

## 2022-05-23 DIAGNOSIS — E559 Vitamin D deficiency, unspecified: Secondary | ICD-10-CM | POA: Diagnosis not present

## 2022-05-23 DIAGNOSIS — I10 Essential (primary) hypertension: Secondary | ICD-10-CM | POA: Diagnosis not present

## 2022-05-23 MED ORDER — CARVEDILOL 12.5 MG PO TABS: 12.5000 mg | ORAL_TABLET | Freq: Two times a day (BID) | ORAL | 3 refills | Status: DC

## 2022-05-23 NOTE — Patient Instructions (Addendum)
It was a pleasure to see you today.  Thank you for giving [place] the opportunity to be involved in your care.  Below is a brief recap of your visit and next steps.  We will plan to see you again in 4 weeks.  Summary Switch metoprolol to carvedilol 12.5 mg twice daily Follow up in 4 weeks  STOP METOPROLOL START CARVEDILOL
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
## 2022-05-29 ENCOUNTER — Encounter (HOSPITAL_BASED_OUTPATIENT_CLINIC_OR_DEPARTMENT_OTHER): Payer: Self-pay

## 2022-05-29 ENCOUNTER — Encounter (HOSPITAL_BASED_OUTPATIENT_CLINIC_OR_DEPARTMENT_OTHER): Payer: Self-pay | Admitting: Cardiology

## 2022-05-29 ENCOUNTER — Ambulatory Visit (INDEPENDENT_AMBULATORY_CARE_PROVIDER_SITE_OTHER): Payer: Commercial Managed Care - PPO | Admitting: Cardiology

## 2022-05-29 VITALS — BP 153/89 | HR 62 | Ht 67.0 in | Wt 181.9 lb

## 2022-05-29 DIAGNOSIS — I2510 Atherosclerotic heart disease of native coronary artery without angina pectoris: Secondary | ICD-10-CM | POA: Diagnosis not present

## 2022-05-29 DIAGNOSIS — E7800 Pure hypercholesterolemia, unspecified: Secondary | ICD-10-CM

## 2022-05-29 DIAGNOSIS — I10 Essential (primary) hypertension: Secondary | ICD-10-CM

## 2022-05-29 DIAGNOSIS — Z8679 Personal history of other diseases of the circulatory system: Secondary | ICD-10-CM

## 2022-05-29 DIAGNOSIS — I480 Paroxysmal atrial fibrillation: Secondary | ICD-10-CM | POA: Diagnosis not present

## 2022-05-29 DIAGNOSIS — Z9889 Other specified postprocedural states: Secondary | ICD-10-CM | POA: Diagnosis not present

## 2022-05-29 DIAGNOSIS — I2583 Coronary atherosclerosis due to lipid rich plaque: Secondary | ICD-10-CM | POA: Diagnosis not present

## 2022-05-29 DIAGNOSIS — Z95828 Presence of other vascular implants and grafts: Secondary | ICD-10-CM

## 2022-05-29 DIAGNOSIS — N289 Disorder of kidney and ureter, unspecified: Secondary | ICD-10-CM

## 2022-05-29 DIAGNOSIS — E875 Hyperkalemia: Secondary | ICD-10-CM

## 2022-05-29 DIAGNOSIS — E559 Vitamin D deficiency, unspecified: Secondary | ICD-10-CM | POA: Insufficient documentation

## 2022-05-29 LAB — BASIC METABOLIC PANEL
BUN/Creatinine Ratio: 18 (ref 12–28)
BUN: 40 mg/dL — ABNORMAL HIGH (ref 8–27)
CO2: 15 mmol/L — ABNORMAL LOW (ref 20–29)
Calcium: 9.2 mg/dL (ref 8.7–10.3)
Chloride: 110 mmol/L — ABNORMAL HIGH (ref 96–106)
Creatinine, Ser: 2.23 mg/dL — ABNORMAL HIGH (ref 0.57–1.00)
Glucose: 99 mg/dL (ref 70–99)
Potassium: 5.2 mmol/L (ref 3.5–5.2)
Sodium: 142 mmol/L (ref 134–144)
eGFR: 24 mL/min/{1.73_m2} — ABNORMAL LOW (ref >59)

## 2022-05-29 MED ORDER — HYDRALAZINE HCL 25 MG PO TABS: 25.0000 mg | ORAL_TABLET | Freq: Two times a day (BID) | ORAL | 1 refills | Status: DC

## 2022-05-29 NOTE — Patient Instructions (Signed)
Medication Instructions:   STOP Amiodarone  START Hydralazine 25 mg twice daily   Labwork: Your provider has recommended lab work today: Art gallery manager.   Please have this collected at [facility] at [place]. The lab is open 8:00 am - 4:30 pm. Please avoid 12:00p - 1:00p for lunch hour. You do not need an appointment. Please go to [address] [place], [place] [address]. This is in the Primary Care office on the 3rd floor, let them know you are there for blood work and they will direct you to the lab.   Follow-Up: Thursday, 07/03/22 @ 8:25 AM     Special Instructions:   Please increase your water intake.

## 2022-05-29 NOTE — Assessment & Plan Note (Signed)
Returning to care today for HTN follow-up.  Amlodipine was increased to 10 mg daily at her last appointment.  She is additionally prescribed metoprolol tartrate 25 mg twice daily.  Her blood pressure today was 149/84 initially and 148/83 on repeat. -Discontinue metoprolol and start carvedilol 12.5 mg twice daily -Continue amlodipine 10 mg daily -Follow-up in 4 HTN check

## 2022-05-29 NOTE — Assessment & Plan Note (Signed)
Noted on recent labs.  She is currently on high-dose, weekly vitamin D supplementation. -Repeat vitamin D level upon completion of high-dose supplementation

## 2022-06-12 ENCOUNTER — Telehealth (HOSPITAL_BASED_OUTPATIENT_CLINIC_OR_DEPARTMENT_OTHER): Payer: Self-pay

## 2022-06-12 NOTE — Telephone Encounter (Addendum)
Left message for patient to call back     ----- Message from Loel Dubonnet, NP sent at 06/11/2022 11:58 AM EST ----- Can you please call to check in on BP for Dorothy Harris? TY! ----- Message ----- From: Loel Dubonnet, NP Sent: 06/11/2022  12:00 AM EST To: Loel Dubonnet, NP  Ask Dorothy Harris call check in BP

## 2022-06-13 NOTE — Telephone Encounter (Signed)
2nd call attempt, no answer, left message to call back with BP readings.

## 2022-06-17 LAB — LAB REPORT - SCANNED: EGFR: 27

## 2022-06-17 NOTE — Telephone Encounter (Signed)
3rd call attempt, no answer, left message

## 2022-06-20 ENCOUNTER — Ambulatory Visit: Payer: Commercial Managed Care - PPO | Admitting: Internal Medicine

## 2022-06-28 ENCOUNTER — Encounter (HOSPITAL_COMMUNITY): Payer: Self-pay | Admitting: Nephrology

## 2022-07-02 ENCOUNTER — Telehealth: Payer: Self-pay | Admitting: Cardiology

## 2022-07-02 MED ORDER — HYDRALAZINE HCL 25 MG PO TABS: 25.0000 mg | ORAL_TABLET | Freq: Three times a day (TID) | ORAL | 1 refills | Status: DC

## 2022-07-02 NOTE — Telephone Encounter (Signed)
She was evaluated by [facility], BP was elevated at that reading and they suggested that she increase her hydralazine to three times/day. Can we reach out and see if they actually ordered it for her? It looks like they suggested we increase it to TID. She is not seen again for ~ 6 weeks and want to make sure we keep her BP under better control.  Please increase hydralazine to 25 mg three times daily (if she has not already).

## 2022-07-02 NOTE — Addendum Note (Signed)
Addended by: Georgana Curio D on: 07/02/2022 04:08 PM   Modules accepted: Orders

## 2022-07-03 ENCOUNTER — Ambulatory Visit (HOSPITAL_BASED_OUTPATIENT_CLINIC_OR_DEPARTMENT_OTHER): Payer: Commercial Managed Care - PPO | Admitting: Family

## 2022-07-03 NOTE — Telephone Encounter (Signed)
Per Georgana Curio,   "Msg left for patient to return call to notify her of hydralazine sig. Change."

## 2022-07-03 NOTE — Telephone Encounter (Signed)
Returned call to patient, no answer, Per  DPR okay to leave detailed message, left the following information on VM. Prescription already updated.     "She was evaluated by [facility], BP was elevated at that reading and they suggested that she increase her hydralazine to three times/day. Can we reach out and see if they actually ordered it for her? It looks like they suggested we increase it to TID. She is not seen again for ~ 6 weeks and want to make sure we keep her BP under better control.   Please increase hydralazine to 25 mg three times daily (if she has not already). "

## 2022-08-07 ENCOUNTER — Encounter (HOSPITAL_BASED_OUTPATIENT_CLINIC_OR_DEPARTMENT_OTHER): Payer: Self-pay | Admitting: Family

## 2022-08-07 ENCOUNTER — Ambulatory Visit (INDEPENDENT_AMBULATORY_CARE_PROVIDER_SITE_OTHER): Payer: Commercial Managed Care - PPO | Admitting: Family

## 2022-08-07 VITALS — BP 144/82 | HR 62 | Ht 67.0 in | Wt 183.5 lb

## 2022-08-07 DIAGNOSIS — Z95828 Presence of other vascular implants and grafts: Secondary | ICD-10-CM | POA: Diagnosis not present

## 2022-08-07 DIAGNOSIS — I2510 Atherosclerotic heart disease of native coronary artery without angina pectoris: Secondary | ICD-10-CM | POA: Diagnosis not present

## 2022-08-07 DIAGNOSIS — I10 Essential (primary) hypertension: Secondary | ICD-10-CM | POA: Diagnosis not present

## 2022-08-07 DIAGNOSIS — I480 Paroxysmal atrial fibrillation: Secondary | ICD-10-CM | POA: Diagnosis not present

## 2022-08-07 DIAGNOSIS — E785 Hyperlipidemia, unspecified: Secondary | ICD-10-CM | POA: Diagnosis not present

## 2022-08-07 DIAGNOSIS — N289 Disorder of kidney and ureter, unspecified: Secondary | ICD-10-CM | POA: Diagnosis not present

## 2022-08-07 DIAGNOSIS — I2583 Coronary atherosclerosis due to lipid rich plaque: Secondary | ICD-10-CM | POA: Diagnosis not present

## 2022-08-07 MED ORDER — CARVEDILOL 12.5 MG PO TABS: 12.5000 mg | ORAL_TABLET | Freq: Two times a day (BID) | ORAL | 3 refills | Status: DC

## 2022-08-07 MED ORDER — ATORVASTATIN CALCIUM 80 MG PO TABS: 80.0000 mg | ORAL_TABLET | Freq: Every day | ORAL | 3 refills | Status: DC

## 2022-08-07 MED ORDER — AMLODIPINE BESYLATE 10 MG PO TABS: 10.0000 mg | ORAL_TABLET | Freq: Every day | ORAL | 3 refills | Status: DC

## 2022-08-07 MED ORDER — HYDRALAZINE HCL 50 MG PO TABS
50.0000 mg | ORAL_TABLET | Freq: Two times a day (BID) | ORAL | 1 refills | Status: DC
Start: 2022-08-07 — End: 2022-11-12

## 2022-08-07 NOTE — Patient Instructions (Addendum)
Medication Instructions:  CHANGE Hydralazine to 50mg  twice daily    Follow-Up: As scheduled  Special Instructions:   Heart Healthy Diet Recommendations: A low-salt diet is recommended. Meats should be grilled, baked, or boiled. Avoid fried foods. Focus on lean protein sources like fish or chicken with vegetables and fruits. The American Heart Association is a Chief Technology Officer!  American Heart Association Diet and Lifeystyle Recommendations   Exercise recommendations: The American Heart Association recommends 150 minutes of moderate intensity exercise weekly. Try 30 minutes of moderate intensity exercise 4-5 times per week. This could include walking, jogging, or swimming.
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
## 2022-08-29 ENCOUNTER — Other Ambulatory Visit: Payer: Self-pay

## 2022-08-29 DIAGNOSIS — I7123 Aneurysm of the descending thoracic aorta, without rupture: Secondary | ICD-10-CM

## 2022-08-29 DIAGNOSIS — I712 Thoracic aortic aneurysm, without rupture, unspecified: Secondary | ICD-10-CM

## 2022-09-11 ENCOUNTER — Telehealth: Payer: Self-pay

## 2022-09-11 NOTE — Telephone Encounter (Signed)
Returned pt's call from answering service message. Left VM that she can call us back if she still needs assistance.

## 2022-10-02 ENCOUNTER — Ambulatory Visit (HOSPITAL_COMMUNITY)
Admission: RE | Admit: 2022-10-02 | Discharge: 2022-10-02 | Disposition: A | Discharge status: Home / Self Care | Payer: Commercial Managed Care - PPO | Source: Ambulatory Visit | Attending: Surgery | Admitting: Surgery

## 2022-10-02 ENCOUNTER — Telehealth: Payer: Self-pay

## 2022-10-02 DIAGNOSIS — I7123 Aneurysm of the descending thoracic aorta, without rupture: Secondary | ICD-10-CM | POA: Insufficient documentation

## 2022-10-02 DIAGNOSIS — I712 Thoracic aortic aneurysm, without rupture, unspecified: Secondary | ICD-10-CM | POA: Diagnosis present

## 2022-10-02 MED ORDER — IOHEXOL 350 MG/ML SOLN
100.0000 mL | Freq: Once | INTRAVENOUS | Status: AC | PRN
  Administered 2022-10-02: 100 mL via INTRAVENOUS

## 2022-10-02 NOTE — Telephone Encounter (Signed)
Linda with [facility] CT called stating that the pt had completed their ordered CTA abd/chest/pelvis with contrast, but the comments for "no contrast" were "overlooked". She wanted to ask the provider if he wanted to have the pt come back in for IV fluids.  Text msg sent to Dr. Myra Gianotti on surgery phone since he was in surgery at the time.  No response. Spoke with [place], [place] who advised that pt f/u with PCP to get labs for kidney function.  Called pt, no answer, lf vm detailing the importance of drinking as much water as possible and calling PCP to set up appt for labs.  Called pt's PCP, [facility], and spoke with a scheduler. Explained the situation. She stated that she had some openings this afternoon and would call the pt to get her scheduled. Confirmed understanding.

## 2022-10-06 ENCOUNTER — Ambulatory Visit (INDEPENDENT_AMBULATORY_CARE_PROVIDER_SITE_OTHER): Payer: Commercial Managed Care - PPO | Admitting: Surgery

## 2022-10-06 ENCOUNTER — Encounter: Payer: Self-pay | Admitting: Surgery

## 2022-10-06 VITALS — BP 144/76 | HR 60 | Temp 97.8°F | Resp 20 | Ht 67.0 in | Wt 187.0 lb

## 2022-10-06 DIAGNOSIS — I7143 Infrarenal abdominal aortic aneurysm, without rupture: Secondary | ICD-10-CM

## 2022-10-06 NOTE — Progress Notes (Signed)
[facility] [place]  Patient name: Dorothy Harris MRN: [id number] DOB: Nov 07, 1960 Sex: female   REASON FOR VISIT:    Follow up  HISOTRY OF PRESENT ILLNESS:    Dorothy Harris is a 62 y.o. 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 Laneta Simmers on 03/25/2021.  Her postoperative course was complicated by an incarcerated hernia with colon necrosis and perforation which required exploratory laparotomy, colon resection, and ileostomy with hernia repair.  She progressed to stage IIIb renal insufficiency with a creatinine around 2.4.  Her thoracic aorta was found to be dilated to 6 cm.  We discussed proceeding with repair.  On 01/15/2022, she underwent stent graft repair.  This was complicated by a groin issue from the Pro-glide which required endarterectomy and patch angioplasty.  She did great initially and went home on postoperative day 1, however that evening she developed leg weakness.  She contacted EMS, but was not brought to the hospital.  The following morning she had worsening leg weakness and presented to the hospital.  She was transferred emergently to [facility] where a lumbar drain was placed in addition to increasing her blood pressure.  Over the course of the next several days, she began regaining function of her legs.  She was ultimately discharged to rehab.  She is now back at work still uses a cane but is getting around well   The patient is medically managed for hypertension.  She is a smoker.  She takes a statin for hypercholesterolemia.  She is medically managed for hypertension.   PAST MEDICAL HISTORY:   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HCC)    Anemia    Chronic kidney disease    Essential hypertension 05/07/2021   GERD (gastroesophageal reflux disease)    Hernia, epigastric    Hypertension    PAF  (paroxysmal atrial fibrillation) (HCC) 05/07/2021   Pure hypercholesterolemia 05/07/2021     FAMILY HISTORY:   Family History  Problem Relation Age of Onset   Hypertension Mother    Diabetes Father    Diabetes Sister    Kidney disease Sister    Hypertension Sister    Hypertension Sister    Stroke Sister    Hypertension Sister    Diabetes Maternal Aunt    Breast cancer Maternal Aunt     SOCIAL HISTORY:   Social History   Tobacco Use   Smoking status: Some Days    Packs/day: 0.25    Years: 20.00    Additional pack years: 0.00    Total pack years: 5.00    Types: Cigarettes   Smokeless tobacco: Never  Substance Use Topics   Alcohol use: Not Currently    Alcohol/week: 3.0 standard drinks of alcohol    Types: 3 Cans of beer per week     ALLERGIES:   Allergies  Allergen Reactions   Chlorthalidone Other (See Comments)    ELEVATED KIDNEY FUNCTION      CURRENT MEDICATIONS:   Current Outpatient Medications  Medication Sig Dispense Refill   acetaminophen (TYLENOL) 325 MG tablet Take 1-2 tablets (325-650 mg total) by mouth every 4 (four) hours as needed for mild pain. (Patient taking differently: Take 650 mg by mouth 3 (three) times daily as needed for mild pain.)     amLODipine (NORVASC) 10 MG tablet Take 1 tablet (10 mg total) by mouth daily. 90 tablet 3  aspirin 81 MG chewable tablet Chew 4 tablets (324 mg total) by mouth daily.     atorvastatin (LIPITOR) 80 MG tablet Take 1 tablet (80 mg total) by mouth daily. 90 tablet 3   carvedilol (COREG) 12.5 MG tablet Take 1 tablet (12.5 mg total) by mouth 2 (two) times daily with a meal. 180 tablet 3   cyclobenzaprine (FLEXERIL) 5 MG tablet Take 1 tablet (5 mg total) by mouth as needed for up to 30 doses for muscle spasms. 30 tablet 0   diclofenac Sodium (VOLTAREN) 1 % GEL Apply 2 g topically 4 (four) times daily. 2 g 5   ferrous sulfate 325 (65 FE) MG tablet Take 1 tablet (325 mg total) by mouth daily with breakfast. 30  tablet 0   hydrALAZINE (APRESOLINE) 50 MG tablet Take 1 tablet (50 mg total) by mouth 2 (two) times daily. 180 tablet 1   Multiple Vitamin (MULTIVITAMIN WITH MINERALS) TABS tablet Take 1 tablet by mouth daily.     omeprazole (PRILOSEC) 20 MG capsule Take by mouth.     sodium bicarbonate 650 MG tablet Take 650 mg by mouth daily.     tamsulosin (FLOMAX) 0.4 MG CAPS capsule Take 0.4 mg by mouth.     No current facility-administered medications for this visit.    REVIEW OF SYSTEMS:   [X]  denotes positive finding, [ ]  denotes negative finding Cardiac  Comments:  Chest pain or chest pressure:    Shortness of breath upon exertion:    Short of breath when lying flat:    Irregular heart rhythm:        Vascular    Pain in calf, thigh, or hip brought on by ambulation:    Pain in feet at night that wakes you up from your sleep:     Blood clot in your veins:    Leg swelling:         Pulmonary    Oxygen at home:    Productive cough:     Wheezing:         Neurologic    Sudden weakness in arms or legs:     Sudden numbness in arms or legs:     Sudden onset of difficulty speaking or slurred speech:    Temporary loss of vision in one eye:     Problems with dizziness:         Gastrointestinal    Blood in stool:     Vomited blood:         Genitourinary    Burning when urinating:     Blood in urine:        Psychiatric    Major depression:         Hematologic    Bleeding problems:    Problems with blood clotting too easily:        Skin    Rashes or ulcers:        Constitutional    Fever or chills:      PHYSICAL EXAM:   Vitals:   10/06/22 0926  BP: (!) 144/76  Pulse: 60  Resp: 20  Temp: 97.8 F (36.6 C)  SpO2: 98%  Weight: 187 lb (84.8 kg)  Height: 5\' 7"  (1.702 m)    GENERAL: The patient is a well-nourished female, in no acute distress. The vital signs are documented above. CARDIAC: There is a regular rate and rhythm.  VASCULAR: Palpable right dorsalis pedis, left  posterior tibial PULMONARY: Non-labored respirations ABDOMEN: Soft and non-tender with  ostomy in place  MUSCULOSKELETAL: There are no major deformities or cyanosis. NEUROLOGIC: No focal weakness or paresthesias are detected. SKIN: There are no ulcers or rashes noted. PSYCHIATRIC: The patient has a normal affect.  STUDIES:   Reviewed her CT scan.  Has not formally been read by radiology.  By my measurement she is greater than 5 cm.  MEDICAL ISSUES:   AAA: Greater than 5 cm infrarenal abdominal aortic aneurysm: We discussed proceeding with repair.  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  She is going to contemplate whether to do this now or wait until it gets larger.  I will call her on Friday after her report has been formally evaluated by radiology.  We did discuss the details of the operation including the need for spinal drain, the risk of paralysis, cardiopulmonary complications, renal dysfunction, intestinal ischemia.  She is leaning towards getting this done because she wants to get her ostomy taken down and this will likely not happen until her aneurysm has been addressed    Durene Cal, IV, MD, FACS [facility] [phone] Pager [phone]

## 2022-10-10 ENCOUNTER — Ambulatory Visit: Payer: Commercial Managed Care - PPO | Admitting: Surgery

## 2022-10-16 ENCOUNTER — Encounter (HOSPITAL_COMMUNITY): Payer: Self-pay | Admitting: Nephrology
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
## 2022-10-22 ENCOUNTER — Other Ambulatory Visit: Payer: Self-pay
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
## 2022-10-23 ENCOUNTER — Encounter (HOSPITAL_BASED_OUTPATIENT_CLINIC_OR_DEPARTMENT_OTHER): Payer: Self-pay | Admitting: *Deleted

## 2022-10-23 ENCOUNTER — Encounter (HOSPITAL_BASED_OUTPATIENT_CLINIC_OR_DEPARTMENT_OTHER): Payer: Self-pay | Admitting: Cardiovascular Disease

## 2022-10-23 ENCOUNTER — Ambulatory Visit (INDEPENDENT_AMBULATORY_CARE_PROVIDER_SITE_OTHER): Payer: Commercial Managed Care - PPO | Admitting: Cardiovascular Disease

## 2022-10-23 VITALS — BP 125/74 | HR 66 | Ht 67.0 in | Wt 187.3 lb

## 2022-10-23 DIAGNOSIS — I714 Abdominal aortic aneurysm, without rupture, unspecified: Secondary | ICD-10-CM | POA: Diagnosis not present

## 2022-10-23 DIAGNOSIS — I2510 Atherosclerotic heart disease of native coronary artery without angina pectoris: Secondary | ICD-10-CM

## 2022-10-23 DIAGNOSIS — R011 Cardiac murmur, unspecified: Secondary | ICD-10-CM

## 2022-10-23 DIAGNOSIS — I5033 Acute on chronic diastolic (congestive) heart failure: Secondary | ICD-10-CM | POA: Diagnosis not present

## 2022-10-23 DIAGNOSIS — Z01810 Encounter for preprocedural cardiovascular examination: Secondary | ICD-10-CM

## 2022-10-23 DIAGNOSIS — I2583 Coronary atherosclerosis due to lipid rich plaque: Secondary | ICD-10-CM

## 2022-10-23 NOTE — Patient Instructions (Signed)
Medication Instructions:  Your physician recommends that you continue on your current medications as directed. Please refer to the Current Medication list given to you today.   Labwork: NONE  Testing/Procedures: Your physician has requested that you have an echocardiogram. Echocardiography is a painless test that uses sound waves to create images of your heart. It provides your doctor with information about the size and shape of your heart and how well your heart's chambers and valves are working. This procedure takes approximately one hour. There are no restrictions for this procedure. Please do NOT wear cologne, perfume, aftershave, or lotions (deodorant is allowed). Please arrive 15 minutes prior to your appointment time.  Your physician has requested that you have a lexiscan myoview. For further information please visit [email or web address] Please follow instruction sheet, as given.  Follow-Up: 6 MONTHS WITH CAITLIN W NP OR DR [place] IN THE HYPERTENSION CLINIC   Any Other Special Instructions Will Be Listed Below (If Applicable).  WILL HAVE AMY OUR CAREGUIDE REACH OUT TO YOU TO HELP WITH SMOKING   MONITOR YOUR BLOOD PRESSURE TWICE A DAY FOR 1 WEEK. CALL THE OFFICE IF IT IS NOT STAYING BELOW 130/80      If you need a refill on your cardiac medications before your next appointment, please call your pharmacy.

## 2022-10-27 ENCOUNTER — Telehealth: Payer: Self-pay

## 2022-10-27 DIAGNOSIS — Z0000 Encounter for general adult medical examination without abnormal findings: Secondary | ICD-10-CM

## 2022-10-27 NOTE — Telephone Encounter (Signed)
Called patient per health coaching referral from Dr. Duke Salvia for smoking cessation. Left message for patient to return call to discuss health coaching.   Renaee Munda, MS, ERHD, [facility]  Care Guide, Health & Wellness Coach [address], [address] [place] [address] Telephone: [phone] Email: [email or web address]

## 2022-11-04 ENCOUNTER — Telehealth: Payer: Self-pay

## 2022-11-04 DIAGNOSIS — Z0000 Encounter for general adult medical examination without abnormal findings: Secondary | ICD-10-CM

## 2022-11-04 NOTE — Telephone Encounter (Signed)
Called patient to follow up on health coaching referral for smoking cessation from Dr. Duke Salvia. Patient is interested in quitting smoking but is not feeling well enough to talk today. Informed patient that I would call back next week during her lunch break at 12:30pm to determine when she would be interested in starting the program.  Patient verbally expressed understanding.   Renaee Munda, MS, ERHD, [facility]  Care Guide, Health & Wellness Coach [address], [address] [place] [address] Telephone: [phone] Email: [email or web address]

## 2022-11-12 ENCOUNTER — Ambulatory Visit (INDEPENDENT_AMBULATORY_CARE_PROVIDER_SITE_OTHER): Payer: Commercial Managed Care - PPO | Admitting: Internal Medicine

## 2022-11-12 ENCOUNTER — Encounter: Payer: Self-pay | Admitting: Internal Medicine

## 2022-11-12 VITALS — BP 134/74 | HR 75 | Ht 67.0 in | Wt 186.4 lb

## 2022-11-12 DIAGNOSIS — I714 Abdominal aortic aneurysm, without rupture, unspecified: Secondary | ICD-10-CM

## 2022-11-12 DIAGNOSIS — R0609 Other forms of dyspnea: Secondary | ICD-10-CM | POA: Diagnosis not present

## 2022-11-12 DIAGNOSIS — I10 Essential (primary) hypertension: Secondary | ICD-10-CM

## 2022-11-12 MED ORDER — HYDRALAZINE HCL 50 MG PO TABS
25.0000 mg | ORAL_TABLET | Freq: Two times a day (BID) | ORAL | Status: DC
Start: 2022-11-12 — End: 2023-02-25
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
## 2022-11-12 NOTE — Patient Instructions (Signed)
It was a pleasure to see you today.  Thank you for giving [place] the opportunity to be involved in your care.  Below is a brief recap of your visit and next steps.  We will plan to see you again in 3 months.  Summary I recommend taking amlodipine in the evening. Resume carvedilol 12.5 mg twice daily Resume hydralazine at half a tablet twice daily Follow up in 3 months

## 2022-11-13 ENCOUNTER — Telehealth (HOSPITAL_COMMUNITY): Payer: Self-pay | Admitting: *Deleted

## 2022-11-13 NOTE — Telephone Encounter (Signed)

## 2022-11-14 ENCOUNTER — Telehealth: Payer: Self-pay

## 2022-11-14 DIAGNOSIS — Z0000 Encounter for general adult medical examination without abnormal findings: Secondary | ICD-10-CM

## 2022-11-14 NOTE — Telephone Encounter (Signed)
Called patient to follow up on previous call regarding her interest in smoking cessation and participating in health coaching. Patient did not answer. Left voicemail for patient to return call.   Renaee Munda, MS, ERHD, [facility]  Care Guide, Health & Wellness Coach [address] [place] [address] Telephone: [phone] Email: [email or web address]

## 2022-11-17 ENCOUNTER — Ambulatory Visit (HOSPITAL_BASED_OUTPATIENT_CLINIC_OR_DEPARTMENT_OTHER): Payer: Commercial Managed Care - PPO

## 2022-11-17 ENCOUNTER — Ambulatory Visit (HOSPITAL_COMMUNITY): Payer: Commercial Managed Care - PPO | Attending: Cardiovascular Disease

## 2022-11-17 DIAGNOSIS — Z01810 Encounter for preprocedural cardiovascular examination: Secondary | ICD-10-CM | POA: Insufficient documentation

## 2022-11-17 DIAGNOSIS — I5033 Acute on chronic diastolic (congestive) heart failure: Secondary | ICD-10-CM | POA: Diagnosis not present

## 2022-11-17 DIAGNOSIS — I2510 Atherosclerotic heart disease of native coronary artery without angina pectoris: Secondary | ICD-10-CM

## 2022-11-17 DIAGNOSIS — I2583 Coronary atherosclerosis due to lipid rich plaque: Secondary | ICD-10-CM | POA: Diagnosis present

## 2022-11-17 DIAGNOSIS — R011 Cardiac murmur, unspecified: Secondary | ICD-10-CM | POA: Diagnosis not present

## 2022-11-17 LAB — MYOCARDIAL PERFUSION IMAGING
LV dias vol: 88 mL (ref 46–106)
LV sys vol: 33 mL
Nuc Stress EF: 63 %
Peak HR: 78 {beats}/min
Rest HR: 55 {beats}/min
Rest Nuclear Isotope Dose: 10.3 mCi
SDS: 4
SRS: 1
SSS: 5
ST Depression (mm): 0 mm
Stress Nuclear Isotope Dose: 32.7 mCi
TID: 1.06

## 2022-11-17 LAB — ECHOCARDIOGRAM COMPLETE
Area-P 1/2: 3.37 cm2
S' Lateral: 2.4 cm

## 2022-11-17 MED ORDER — REGADENOSON 0.4 MG/5ML IV SOLN
0.4000 mg | Freq: Once | INTRAVENOUS | Status: AC
Start: 2022-11-17 — End: 2022-11-17
  Administered 2022-11-17: 0.4 mg via INTRAVENOUS

## 2022-11-17 MED ORDER — TECHNETIUM TC 99M TETROFOSMIN IV KIT
10.3000 | PACK | Freq: Once | INTRAVENOUS | Status: AC | PRN
  Administered 2022-11-17: 10.3 via INTRAVENOUS

## 2022-11-17 MED ORDER — TECHNETIUM TC 99M TETROFOSMIN IV KIT
32.7000 | PACK | Freq: Once | INTRAVENOUS | Status: AC | PRN
  Administered 2022-11-17: 32.7 via INTRAVENOUS

## 2022-11-18 ENCOUNTER — Telehealth: Payer: Self-pay | Admitting: Surgery

## 2022-11-18 ENCOUNTER — Encounter: Payer: Self-pay | Admitting: Surgery

## 2022-11-18 ENCOUNTER — Encounter: Payer: Self-pay | Admitting: Internal Medicine

## 2022-11-18 NOTE — Telephone Encounter (Signed)
Spoke with patient and am making a referral to Dr. Pattricia Boss at [facility] for her TAAA  WB

## 2022-11-19 ENCOUNTER — Encounter: Payer: Self-pay | Admitting: Internal Medicine

## 2022-11-19 ENCOUNTER — Telehealth: Payer: Self-pay | Admitting: Internal Medicine

## 2022-11-19 DIAGNOSIS — R0609 Other forms of dyspnea: Secondary | ICD-10-CM | POA: Insufficient documentation

## 2022-11-19 NOTE — Telephone Encounter (Signed)
Handicap Placard   Noted  Copied Sleeved  Original in PCP box Copy front desk folder

## 2022-11-19 NOTE — Telephone Encounter (Signed)
Form documented and placed in provider box to be filled out

## 2022-11-19 NOTE — Assessment & Plan Note (Signed)
Recently seen by vascular surgery (Dr. Myra Gianotti) for follow-up.  Greater than 5 cm infrarenal abdominal aortic aneurysm is noted.  Surgical repair is indicated.  She is hesitant about undergoing repair as she will require a lumbar drain and has had previous complications.  She will continue to discuss this with Dr. Myra Gianotti.

## 2022-11-19 NOTE — Assessment & Plan Note (Signed)
BP today is 134/74.  Her acute concern is recent dizziness that occurs with sudden changes in position.  Her's description seems most consistent with orthostasis.  Orthostatics are negative today.  Symptoms have resolved since reducing the frequency of carvedilol and hydralazine to once daily.  Her currently prescribed antihypertensive regimen consists of amlodipine 10 mg daily, carvedilol 12.5 mg twice daily, and hydralazine 50 mg twice daily.  She has established care with the advanced hypertension clinic and was last evaluated by Dr. Duke Salvia on 7/18. -I have recommended that Ms. Homesley resume carvedilol 12.5 mg twice daily.  Reduce hydralazine to 25 mg twice daily.  Continue amlodipine as currently prescribed.

## 2022-11-19 NOTE — Assessment & Plan Note (Signed)
Euvolemic on exam today.  She has recently experienced worsening dyspnea with exertion.  She will undergo repeat TTE and stress testing next week.

## 2022-12-04 NOTE — Telephone Encounter (Signed)
Patient picked up forms.

## 2022-12-15 ENCOUNTER — Other Ambulatory Visit (HOSPITAL_BASED_OUTPATIENT_CLINIC_OR_DEPARTMENT_OTHER): Payer: Self-pay

## 2022-12-15 ENCOUNTER — Telehealth (HOSPITAL_BASED_OUTPATIENT_CLINIC_OR_DEPARTMENT_OTHER): Payer: Self-pay | Admitting: Cardiovascular Disease

## 2022-12-15 DIAGNOSIS — R011 Cardiac murmur, unspecified: Secondary | ICD-10-CM

## 2022-12-15 DIAGNOSIS — I714 Abdominal aortic aneurysm, without rupture, unspecified: Secondary | ICD-10-CM

## 2022-12-15 NOTE — Telephone Encounter (Signed)
Patient had ECHO 11/17/22 Did I need to schedule another one

## 2023-02-12 ENCOUNTER — Ambulatory Visit: Payer: Commercial Managed Care - PPO | Admitting: Internal Medicine

## 2023-02-16 ENCOUNTER — Telehealth: Payer: Self-pay

## 2023-02-16 NOTE — Telephone Encounter (Signed)
Can you take care of this please?? 

## 2023-02-16 NOTE — Telephone Encounter (Signed)
Phone number will not dial out. Sent [employer].

## 2023-02-16 NOTE — Telephone Encounter (Signed)
Copied from CRM [id number]. Topic: Appointments - Appointment Cancel/Reschedule >> Feb 11, 2023 10:11 AM Dimitri Ped wrote: Patient/patient representative is calling to cancel or reschedule an appointment. Refer to attachments for appointment information. I cancel the appt but it wont let me reschedule appt . Patient still wants to be seen but with her provider pplease give patient a call back to reschedule appt

## 2023-02-25 ENCOUNTER — Telehealth: Payer: Self-pay | Admitting: Cardiology

## 2023-02-25 DIAGNOSIS — I10 Essential (primary) hypertension: Secondary | ICD-10-CM

## 2023-02-25 MED ORDER — CARVEDILOL 12.5 MG PO TABS: 12.5000 mg | ORAL_TABLET | Freq: Two times a day (BID) | ORAL | 3 refills | Status: DC

## 2023-02-25 MED ORDER — HYDRALAZINE HCL 50 MG PO TABS: 25.0000 mg | ORAL_TABLET | Freq: Two times a day (BID) | ORAL | 3 refills | Status: DC

## 2023-02-25 NOTE — Telephone Encounter (Signed)
*  STAT* If patient is at the pharmacy, call can be transferred to refill team.   1. Which medications need to be refilled? (please list name of each medication and dose if known)   hydrALAZINE (APRESOLINE) 50 MG tablet   carvedilol (COREG) 12.5 MG tablet  2. Which pharmacy/location (including street and city if local pharmacy) is medication to be sent to? [facility] - [place], [place] - [address]    3. Do they need a 30 day or 90 day supply? 90 day

## 2023-05-05 ENCOUNTER — Encounter: Payer: Self-pay | Admitting: Internal Medicine

## 2023-05-05 ENCOUNTER — Ambulatory Visit: Payer: Commercial Managed Care - PPO | Admitting: Internal Medicine

## 2023-05-05 VITALS — BP 144/80 | HR 70 | Ht 67.0 in | Wt 197.8 lb

## 2023-05-05 DIAGNOSIS — I10 Essential (primary) hypertension: Secondary | ICD-10-CM

## 2023-05-05 DIAGNOSIS — E611 Iron deficiency: Secondary | ICD-10-CM | POA: Diagnosis not present

## 2023-05-05 DIAGNOSIS — N184 Chronic kidney disease, stage 4 (severe): Secondary | ICD-10-CM

## 2023-05-05 DIAGNOSIS — I2510 Atherosclerotic heart disease of native coronary artery without angina pectoris: Secondary | ICD-10-CM

## 2023-05-05 DIAGNOSIS — I714 Abdominal aortic aneurysm, without rupture, unspecified: Secondary | ICD-10-CM

## 2023-05-05 DIAGNOSIS — E559 Vitamin D deficiency, unspecified: Secondary | ICD-10-CM

## 2023-05-05 DIAGNOSIS — E66811 Obesity, class 1: Secondary | ICD-10-CM

## 2023-05-05 DIAGNOSIS — N1831 Chronic kidney disease, stage 3a: Secondary | ICD-10-CM

## 2023-05-05 DIAGNOSIS — I2583 Coronary atherosclerosis due to lipid rich plaque: Secondary | ICD-10-CM

## 2023-05-05 MED ORDER — HYDRALAZINE HCL 25 MG PO TABS: 25.0000 mg | ORAL_TABLET | Freq: Two times a day (BID) | ORAL | 3 refills | Status: DC

## 2023-05-05 NOTE — Patient Instructions (Signed)
It was a pleasure to see you today.  Thank you for giving [place] the opportunity to be involved in your care.  Below is a brief recap of your visit and next steps.  We will plan to see you again in 3 months.  Summary No medication changes today Hydralazine refilled Repeat labs ordered Nurse visit in 2 weeks for BP check Follow up with me in 3 months

## 2023-05-05 NOTE — Assessment & Plan Note (Signed)
BP is mildly elevated today.  Her currently prescribed antihypertensive regimen includes hydralazine 25 mg twice daily, amlodipine 10 mg daily, and carvedilol 12.5 mg twice daily.  She reports being out of hydralazine x 2 months. -Hydralazine 25 mg twice daily refilled today.  Continue amlodipine and carvedilol as currently prescribed.  Nurse visit in 2 weeks for BP check.

## 2023-05-05 NOTE — Assessment & Plan Note (Signed)
Noted on previous labs.  Repeat vitamin D level ordered today

## 2023-05-05 NOTE — Assessment & Plan Note (Signed)
Infrarenal abdominal aortic aneurysm.  Referred to [facility] vascular surgery for evaluation and has opted for medical management.  Will address HTN control as below.
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
## 2023-05-05 NOTE — Assessment & Plan Note (Signed)
GFR 27 on labs from March 2024.  She is followed by nephrology but has not recently been seen for follow-up.  Repeat labs ordered today.  I recommended contacting nephrology to arrange follow-up.

## 2023-05-05 NOTE — Assessment & Plan Note (Signed)
Denies recent chest pain.  Remains on ASA 81 mg daily and atorvastatin 80 mg daily.

## 2023-05-05 NOTE — Assessment & Plan Note (Signed)
History of iron deficiency.  Previously taking oral iron supplementation but not taking currently.  Repeat iron studies and CBC ordered today.

## 2023-05-06 ENCOUNTER — Other Ambulatory Visit: Payer: Self-pay | Admitting: Internal Medicine

## 2023-05-06 DIAGNOSIS — I2510 Atherosclerotic heart disease of native coronary artery without angina pectoris: Secondary | ICD-10-CM

## 2023-05-06 LAB — IRON,TIBC AND FERRITIN PANEL
Ferritin: 251 ng/mL — ABNORMAL HIGH (ref 15–150)
Iron Saturation: 21 % (ref 15–55)
Iron: 61 ug/dL (ref 27–139)
Total Iron Binding Capacity: 290 ug/dL (ref 250–450)
UIBC: 229 ug/dL (ref 118–369)

## 2023-05-06 LAB — CMP14+EGFR
ALT: 52 [IU]/L — ABNORMAL HIGH (ref 0–32)
AST: 26 [IU]/L (ref 0–40)
Albumin: 4.3 g/dL (ref 3.9–4.9)
Alkaline Phosphatase: 177 [IU]/L — ABNORMAL HIGH (ref 44–121)
BUN/Creatinine Ratio: 16 (ref 12–28)
BUN: 31 mg/dL — ABNORMAL HIGH (ref 8–27)
Bilirubin Total: 0.4 mg/dL (ref 0.0–1.2)
CO2: 15 mmol/L — ABNORMAL LOW (ref 20–29)
Calcium: 9.1 mg/dL (ref 8.7–10.3)
Chloride: 110 mmol/L — ABNORMAL HIGH (ref 96–106)
Creatinine, Ser: 1.97 mg/dL — ABNORMAL HIGH (ref 0.57–1.00)
Globulin, Total: 2.7 g/dL (ref 1.5–4.5)
Glucose: 96 mg/dL (ref 70–99)
Potassium: 5.2 mmol/L (ref 3.5–5.2)
Sodium: 140 mmol/L (ref 134–144)
Total Protein: 7 g/dL (ref 6.0–8.5)
eGFR: 28 mL/min/{1.73_m2} — ABNORMAL LOW (ref >59)

## 2023-05-06 LAB — LIPID PANEL
Chol/HDL Ratio: 5.2 {ratio} — ABNORMAL HIGH (ref 0.0–4.4)
Cholesterol, Total: 209 mg/dL — ABNORMAL HIGH (ref 100–199)
HDL: 40 mg/dL (ref >39)
LDL Chol Calc (NIH): 127 mg/dL — ABNORMAL HIGH (ref 0–99)
Triglycerides: 239 mg/dL — ABNORMAL HIGH (ref 0–149)
VLDL Cholesterol Cal: 42 mg/dL — ABNORMAL HIGH (ref 5–40)

## 2023-05-06 LAB — CBC WITH DIFFERENTIAL/PLATELET
Basophils Absolute: 0.1 10*3/uL (ref 0.0–0.2)
Basos: 1 %
EOS (ABSOLUTE): 0.3 10*3/uL (ref 0.0–0.4)
Eos: 4 %
Hematocrit: 33 % — ABNORMAL LOW (ref 34.0–46.6)
Hemoglobin: 10.6 g/dL — ABNORMAL LOW (ref 11.1–15.9)
Immature Grans (Abs): 0 10*3/uL (ref 0.0–0.1)
Immature Granulocytes: 0 %
Lymphocytes Absolute: 1.7 10*3/uL (ref 0.7–3.1)
Lymphs: 21 %
MCH: 30.6 pg (ref 26.6–33.0)
MCHC: 32.1 g/dL (ref 31.5–35.7)
MCV: 95 fL (ref 79–97)
Monocytes Absolute: 0.6 10*3/uL (ref 0.1–0.9)
Monocytes: 8 %
Neutrophils Absolute: 5.2 10*3/uL (ref 1.4–7.0)
Neutrophils: 66 %
Platelets: 245 10*3/uL (ref 150–450)
RBC: 3.46 x10E6/uL — ABNORMAL LOW (ref 3.77–5.28)
RDW: 15.6 % — ABNORMAL HIGH (ref 11.7–15.4)
WBC: 7.9 10*3/uL (ref 3.4–10.8)

## 2023-05-06 LAB — B12 AND FOLATE PANEL
Folate: 9.1 ng/mL (ref >3.0)
Vitamin B-12: 854 pg/mL (ref 232–1245)

## 2023-05-06 LAB — VITAMIN D 25 HYDROXY (VIT D DEFICIENCY, FRACTURES): Vit D, 25-Hydroxy: 27.4 ng/mL — ABNORMAL LOW (ref 30.0–100.0)

## 2023-05-06 LAB — HEMOGLOBIN A1C
Est. average glucose Bld gHb Est-mCnc: 105 mg/dL
Hgb A1c MFr Bld: 5.3 % (ref 4.8–5.6)

## 2023-05-06 LAB — TSH+FREE T4
Free T4: 1.04 ng/dL (ref 0.82–1.77)
TSH: 1.79 u[IU]/mL (ref 0.450–4.500)

## 2023-05-06 MED ORDER — ATORVASTATIN CALCIUM 80 MG PO TABS: 80.0000 mg | ORAL_TABLET | Freq: Every day | ORAL | 3 refills | Status: DC

## 2023-05-19 ENCOUNTER — Ambulatory Visit: Payer: Commercial Managed Care - PPO

## 2023-05-19 NOTE — Progress Notes (Unsigned)
Pt. Came in to office for B/P check. She stated that she was not feeling any pain other than usual soreness.   First pressure reading taken in The Left arm   B/P-164/89 Pulse -69 Spo2-97  MA has found recently in several pt. That the pressure readings have been drastically different in different arms so B/P was immediately taken in the rt arm w/ a reading of  148/83.   Per office policy MA had pt. Wait for 5 min in quiet room before retaking pressure for the third time .  Pt. Chose third pressure to be in left arm. Readings were :   B/P-130/60 Pulse -69 Spo2-98  MA gave pt. B/P log, explained how to keep record, and requested that pt return in 2 wks for recheck if she was able to leave work to have pressure taken. She stated she would keep log and if she was not able to return in two weeks she would keep log until next appt. In 3 months.   Pt requested work excuse for the time taken for reading, MA provided.

## 2023-08-06 ENCOUNTER — Encounter (HOSPITAL_COMMUNITY): Payer: Self-pay | Admitting: Emergency Medicine

## 2023-08-06 ENCOUNTER — Inpatient Hospital Stay (HOSPITAL_COMMUNITY)
Admission: EM | Admit: 2023-08-06 | Discharge: 2023-08-30 | DRG: 329 | Disposition: A | Discharge status: Skilled Nursing Facility | Attending: Family Medicine | Admitting: Family Medicine

## 2023-08-06 ENCOUNTER — Other Ambulatory Visit: Payer: Self-pay

## 2023-08-06 ENCOUNTER — Emergency Department (HOSPITAL_COMMUNITY)

## 2023-08-06 DIAGNOSIS — E873 Alkalosis: Secondary | ICD-10-CM | POA: Diagnosis not present

## 2023-08-06 DIAGNOSIS — I2489 Other forms of acute ischemic heart disease: Secondary | ICD-10-CM | POA: Diagnosis present

## 2023-08-06 DIAGNOSIS — E7800 Pure hypercholesterolemia, unspecified: Secondary | ICD-10-CM | POA: Diagnosis present

## 2023-08-06 DIAGNOSIS — Z7901 Long term (current) use of anticoagulants: Secondary | ICD-10-CM

## 2023-08-06 DIAGNOSIS — E870 Hyperosmolality and hypernatremia: Secondary | ICD-10-CM | POA: Diagnosis not present

## 2023-08-06 DIAGNOSIS — I959 Hypotension, unspecified: Secondary | ICD-10-CM | POA: Diagnosis not present

## 2023-08-06 DIAGNOSIS — I471 Supraventricular tachycardia, unspecified: Secondary | ICD-10-CM | POA: Diagnosis not present

## 2023-08-06 DIAGNOSIS — F17210 Nicotine dependence, cigarettes, uncomplicated: Secondary | ICD-10-CM | POA: Diagnosis present

## 2023-08-06 DIAGNOSIS — Z79899 Other long term (current) drug therapy: Secondary | ICD-10-CM

## 2023-08-06 DIAGNOSIS — K430 Incisional hernia with obstruction, without gangrene: Secondary | ICD-10-CM | POA: Diagnosis present

## 2023-08-06 DIAGNOSIS — Z888 Allergy status to other drugs, medicaments and biological substances status: Secondary | ICD-10-CM

## 2023-08-06 DIAGNOSIS — I130 Hypertensive heart and chronic kidney disease with heart failure and stage 1 through stage 4 chronic kidney disease, or unspecified chronic kidney disease: Secondary | ICD-10-CM | POA: Diagnosis present

## 2023-08-06 DIAGNOSIS — I422 Other hypertrophic cardiomyopathy: Secondary | ICD-10-CM | POA: Diagnosis present

## 2023-08-06 DIAGNOSIS — K433 Parastomal hernia with obstruction, without gangrene: Secondary | ICD-10-CM | POA: Diagnosis not present

## 2023-08-06 DIAGNOSIS — Z9889 Other specified postprocedural states: Secondary | ICD-10-CM

## 2023-08-06 DIAGNOSIS — N184 Chronic kidney disease, stage 4 (severe): Secondary | ICD-10-CM | POA: Diagnosis present

## 2023-08-06 DIAGNOSIS — K651 Peritoneal abscess: Secondary | ICD-10-CM | POA: Diagnosis present

## 2023-08-06 DIAGNOSIS — K219 Gastro-esophageal reflux disease without esophagitis: Secondary | ICD-10-CM | POA: Diagnosis present

## 2023-08-06 DIAGNOSIS — K559 Vascular disorder of intestine, unspecified: Secondary | ICD-10-CM | POA: Diagnosis present

## 2023-08-06 DIAGNOSIS — I4891 Unspecified atrial fibrillation: Secondary | ICD-10-CM | POA: Diagnosis present

## 2023-08-06 DIAGNOSIS — I4892 Unspecified atrial flutter: Secondary | ICD-10-CM | POA: Diagnosis present

## 2023-08-06 DIAGNOSIS — K630 Abscess of intestine: Secondary | ICD-10-CM | POA: Diagnosis present

## 2023-08-06 DIAGNOSIS — E875 Hyperkalemia: Secondary | ICD-10-CM | POA: Diagnosis not present

## 2023-08-06 DIAGNOSIS — I480 Paroxysmal atrial fibrillation: Secondary | ICD-10-CM | POA: Diagnosis present

## 2023-08-06 DIAGNOSIS — R109 Unspecified abdominal pain: Secondary | ICD-10-CM | POA: Diagnosis present

## 2023-08-06 DIAGNOSIS — E66811 Obesity, class 1: Secondary | ICD-10-CM | POA: Diagnosis present

## 2023-08-06 DIAGNOSIS — Z6830 Body mass index (BMI) 30.0-30.9, adult: Secondary | ICD-10-CM

## 2023-08-06 DIAGNOSIS — D62 Acute posthemorrhagic anemia: Secondary | ICD-10-CM | POA: Diagnosis not present

## 2023-08-06 DIAGNOSIS — E876 Hypokalemia: Secondary | ICD-10-CM | POA: Diagnosis present

## 2023-08-06 DIAGNOSIS — M549 Dorsalgia, unspecified: Secondary | ICD-10-CM | POA: Diagnosis not present

## 2023-08-06 DIAGNOSIS — I5030 Unspecified diastolic (congestive) heart failure: Secondary | ICD-10-CM | POA: Diagnosis present

## 2023-08-06 DIAGNOSIS — Z7982 Long term (current) use of aspirin: Secondary | ICD-10-CM

## 2023-08-06 DIAGNOSIS — E66812 Obesity, class 2: Secondary | ICD-10-CM | POA: Diagnosis present

## 2023-08-06 DIAGNOSIS — E860 Dehydration: Secondary | ICD-10-CM | POA: Diagnosis present

## 2023-08-06 DIAGNOSIS — N179 Acute kidney failure, unspecified: Principal | ICD-10-CM | POA: Diagnosis present

## 2023-08-06 DIAGNOSIS — I4819 Other persistent atrial fibrillation: Secondary | ICD-10-CM | POA: Diagnosis present

## 2023-08-06 DIAGNOSIS — Z841 Family history of disorders of kidney and ureter: Secondary | ICD-10-CM

## 2023-08-06 DIAGNOSIS — Z933 Colostomy status: Secondary | ICD-10-CM

## 2023-08-06 DIAGNOSIS — Z5941 Food insecurity: Secondary | ICD-10-CM

## 2023-08-06 DIAGNOSIS — R112 Nausea with vomiting, unspecified: Secondary | ICD-10-CM | POA: Diagnosis present

## 2023-08-06 DIAGNOSIS — Z8249 Family history of ischemic heart disease and other diseases of the circulatory system: Secondary | ICD-10-CM

## 2023-08-06 DIAGNOSIS — K435 Parastomal hernia without obstruction or  gangrene: Secondary | ICD-10-CM | POA: Diagnosis present

## 2023-08-06 DIAGNOSIS — R7989 Other specified abnormal findings of blood chemistry: Secondary | ICD-10-CM | POA: Diagnosis present

## 2023-08-06 DIAGNOSIS — I10 Essential (primary) hypertension: Secondary | ICD-10-CM | POA: Diagnosis present

## 2023-08-06 DIAGNOSIS — K56609 Unspecified intestinal obstruction, unspecified as to partial versus complete obstruction: Secondary | ICD-10-CM | POA: Diagnosis present

## 2023-08-06 DIAGNOSIS — R339 Retention of urine, unspecified: Secondary | ICD-10-CM | POA: Diagnosis not present

## 2023-08-06 DIAGNOSIS — E871 Hypo-osmolality and hyponatremia: Secondary | ICD-10-CM | POA: Diagnosis present

## 2023-08-06 DIAGNOSIS — K631 Perforation of intestine (nontraumatic): Secondary | ICD-10-CM | POA: Diagnosis present

## 2023-08-06 DIAGNOSIS — Z8673 Personal history of transient ischemic attack (TIA), and cerebral infarction without residual deficits: Secondary | ICD-10-CM

## 2023-08-06 DIAGNOSIS — E861 Hypovolemia: Secondary | ICD-10-CM | POA: Diagnosis present

## 2023-08-06 DIAGNOSIS — D3500 Benign neoplasm of unspecified adrenal gland: Secondary | ICD-10-CM | POA: Diagnosis present

## 2023-08-06 DIAGNOSIS — Z932 Ileostomy status: Secondary | ICD-10-CM

## 2023-08-06 DIAGNOSIS — Z833 Family history of diabetes mellitus: Secondary | ICD-10-CM

## 2023-08-06 DIAGNOSIS — K436 Other and unspecified ventral hernia with obstruction, without gangrene: Secondary | ICD-10-CM | POA: Diagnosis present

## 2023-08-06 LAB — CBC WITH DIFFERENTIAL/PLATELET
Abs Immature Granulocytes: 0.1 10*3/uL — ABNORMAL HIGH (ref 0.00–0.07)
Band Neutrophils: 17 %
Basophils Absolute: 0 10*3/uL (ref 0.0–0.1)
Basophils Relative: 0 %
Eosinophils Absolute: 0.1 10*3/uL (ref 0.0–0.5)
Eosinophils Relative: 1 %
HCT: 40.3 % (ref 36.0–46.0)
Hemoglobin: 13 g/dL (ref 12.0–15.0)
Lymphocytes Relative: 19 %
Lymphs Abs: 1.5 10*3/uL (ref 0.7–4.0)
MCH: 31.6 pg (ref 26.0–34.0)
MCHC: 32.3 g/dL (ref 30.0–36.0)
MCV: 98.1 fL (ref 80.0–100.0)
Metamyelocytes Relative: 1 %
Monocytes Absolute: 1.4 10*3/uL — ABNORMAL HIGH (ref 0.1–1.0)
Monocytes Relative: 17 %
Neutro Abs: 5 10*3/uL (ref 1.7–7.7)
Neutrophils Relative %: 45 %
Platelets: 290 10*3/uL (ref 150–400)
RBC: 4.11 MIL/uL (ref 3.87–5.11)
RDW: 15 % (ref 11.5–15.5)
Smear Review: ADEQUATE
WBC: 8 10*3/uL (ref 4.0–10.5)
nRBC: 0 % (ref 0.0–0.2)

## 2023-08-06 LAB — COMPREHENSIVE METABOLIC PANEL WITH GFR
ALT: 20 U/L (ref 0–44)
AST: 23 U/L (ref 15–41)
Albumin: 4.2 g/dL (ref 3.5–5.0)
Alkaline Phosphatase: 100 U/L (ref 38–126)
Anion gap: 19 — ABNORMAL HIGH (ref 5–15)
BUN: 57 mg/dL — ABNORMAL HIGH (ref 8–23)
CO2: 19 mmol/L — ABNORMAL LOW (ref 22–32)
Calcium: 9.2 mg/dL (ref 8.9–10.3)
Chloride: 96 mmol/L — ABNORMAL LOW (ref 98–111)
Creatinine, Ser: 4.59 mg/dL — ABNORMAL HIGH (ref 0.44–1.00)
GFR, Estimated: 10 mL/min — ABNORMAL LOW (ref >60)
Glucose, Bld: 189 mg/dL — ABNORMAL HIGH (ref 70–99)
Potassium: 4 mmol/L (ref 3.5–5.1)
Sodium: 134 mmol/L — ABNORMAL LOW (ref 135–145)
Total Bilirubin: 0.8 mg/dL (ref 0.0–1.2)
Total Protein: 8.1 g/dL (ref 6.5–8.1)

## 2023-08-06 LAB — LIPASE, BLOOD: Lipase: 31 U/L (ref 11–51)

## 2023-08-06 LAB — TROPONIN I (HIGH SENSITIVITY)
Troponin I (High Sensitivity): 93 ng/L — ABNORMAL HIGH (ref <18)
Troponin I (High Sensitivity): 86 ng/L — ABNORMAL HIGH (ref <18)

## 2023-08-06 MED ORDER — HYDROMORPHONE HCL 1 MG/ML IJ SOLN
0.5000 mg | Freq: Once | INTRAMUSCULAR | Status: AC
  Administered 2023-08-06: 0.5 mg via INTRAVENOUS
  Filled 2023-08-06: qty 0.5

## 2023-08-06 MED ORDER — LACTATED RINGERS IV BOLUS
1000.0000 mL | Freq: Once | INTRAVENOUS | Status: AC
  Administered 2023-08-06: 1000 mL via INTRAVENOUS

## 2023-08-06 MED ORDER — ONDANSETRON HCL 4 MG/2ML IJ SOLN
4.0000 mg | Freq: Once | INTRAMUSCULAR | Status: AC
  Administered 2023-08-06: 4 mg via INTRAVENOUS
  Filled 2023-08-06: qty 2

## 2023-08-06 NOTE — ED Provider Notes (Signed)
 [facility] DEPARTMENT AT [facility] Provider Note   CSN: [id number] Arrival date & time: 08/06/23  2120     History  Chief Complaint  Patient presents with   Emesis   Nausea    Dorothy Harris is a 63 y.o. female with PMH as listed below who presents with severe nonbloody non-bilious nausea/vomiting since Monday.  Reports she has been unable to take p.o. since that time.  Pt also c/o abdominal pain she describes as cramping in nature. Has colostomy and has had minimal output. EMS states pt's SBP was in 70's upon their arrival and that they administered 250ml NS blous and was able to bring SBP up to 100's.    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HCC)    Anemia    Chronic kidney disease    Essential hypertension 05/07/2021   GERD (gastroesophageal reflux disease)    Hernia, epigastric    Hypertension    PAF (paroxysmal atrial fibrillation) (HCC) 05/07/2021   Pure hypercholesterolemia 05/07/2021       Home Medications Prior to Admission medications   Medication Sig Start Date End Date Taking? Authorizing Provider  acetaminophen  (TYLENOL ) 325 MG tablet Take 1-2 tablets (325-650 mg total) by mouth every 4 (four) hours as needed for mild pain. Patient taking differently: Take 650 mg by mouth 3 (three) times daily as needed for mild pain. 04/25/21   Angiulli, Everlyn Hockey, PA-C  amLODipine  (NORVASC ) 10 MG tablet Take 1 tablet (10 mg total) by mouth daily. 08/07/22 11/12/22  Clearnce Curia, NP  aspirin  81 MG chewable tablet Chew 4 tablets (324 mg total) by mouth daily. 04/25/21   Angiulli, Everlyn Hockey, PA-C  atorvastatin  (LIPITOR ) 80 MG tablet Take 1 tablet (80 mg total) by mouth daily. 05/06/23   [name], MD  carvedilol  (COREG ) 12.5 MG tablet Take 1 tablet (12.5 mg total) by mouth 2 (two) times daily with a meal. 02/25/23   Turner, Rufus Council, MD  cyclobenzaprine  (FLEXERIL ) 5 MG tablet Take 1 tablet (5 mg total) by mouth as needed for up to 30  doses for muscle spasms. 05/08/22   [name], MD  diclofenac  Sodium (VOLTAREN ) 1 % GEL Apply 2 g topically 4 (four) times daily. 02/19/22   [name], MD  ferrous sulfate  325 (65 FE) MG tablet Take 1 tablet (325 mg total) by mouth daily with breakfast. 04/25/21   Angiulli, Everlyn Hockey, PA-C  hydrALAZINE  (APRESOLINE ) 25 MG tablet Take 1 tablet (25 mg total) by mouth in the morning and at bedtime. 05/05/23   Dixon, Phillip E, MD  Multiple Vitamin (MULTIVITAMIN WITH MINERALS) TABS tablet Take 1 tablet by mouth daily. 04/13/21   Lavaughn Portland, MD  omeprazole (PRILOSEC) 20 MG capsule Take by mouth. 02/12/22   [provider]  sodium bicarbonate  650 MG tablet Take 650 mg by mouth daily. 09/20/21   [provider]  tamsulosin (FLOMAX) 0.4 MG CAPS capsule Take 0.4 mg by mouth.    [provider]      Allergies    Chlorthalidone     Review of Systems   Review of Systems A 10 point review of systems was performed and is negative unless otherwise reported in HPI.  Physical Exam Updated Vital Signs BP 122/72 (BP Location: Left Arm)   Pulse 98   Temp 97.8 F (36.6 C) (Oral)   Resp 20   Ht 5\' 7"  (1.702 m)   Wt 89 kg  SpO2 100%   BMI 30.73 kg/m  Physical Exam General: Uncomfortable appearing female, lying in bed.  HEENT: PERRLA, Sclera anicteric, dry mucous membranes, trachea midline.  Cardiology: RRR, no murmurs/rubs/gallops.  Resp: Normal respiratory rate and effort. CTAB, no wheezes, rhonchi, crackles.  Abd: Soft.  Mildly distended.  Diffusely tender to palpation.  Central ventral abdominal hernia without any overlying skin changes, not hard to palpation.  Right abdomen with ostomy without any surrounding erythema or induration.  Minimal ostomy output in the bag..  GU: Deferred. MSK: No peripheral edema or signs of trauma. Extremities without deformity or TTP. No cyanosis or clubbing. Skin: warm, dry.  Back: No CVA tenderness Neuro: A&Ox4, CNs II-XII grossly  intact. MAEs. Sensation grossly intact.  Psych: Normal mood and affect.   ED Results / Procedures / Treatments   Labs (all labs ordered are listed, but only abnormal results are displayed) Labs Reviewed  COMPREHENSIVE METABOLIC PANEL WITH GFR - Abnormal; Notable for the following components:      Result Value   Sodium 134 (*)    Chloride 96 (*)    CO2 19 (*)    Glucose, Bld 189 (*)    BUN 57 (*)    Creatinine, Ser 4.59 (*)    GFR, Estimated 10 (*)    Anion gap 19 (*)    All other components within normal limits  TROPONIN I (HIGH SENSITIVITY) - Abnormal; Notable for the following components:   Troponin I (High Sensitivity) 93 (*)    All other components within normal limits  CBC WITH DIFFERENTIAL/PLATELET  LIPASE, BLOOD    EKG EKG Interpretation Date/Time:  Thursday Aug 06 2023 21:48:42 EDT Ventricular Rate:  91 PR Interval:  151 QRS Duration:  90 QT Interval:  379 QTC Calculation: 467 R Axis:   -11  Text Interpretation: Sinus rhythm Atrial premature complex  Diffuse ST changes similar to prior Confirmed by Annita Kindle [phone]) on 08/06/2023 10:05:35 PM  Radiology No results found.  Procedures Procedures    Medications Ordered in ED Medications  lactated ringers  bolus 1,000 mL (has no administration in time range)  lactated ringers  bolus 1,000 mL (1,000 mLs Intravenous New Bag/Given 08/06/23 2242)  ondansetron  (ZOFRAN ) injection 4 mg (4 mg Intravenous Given 08/06/23 2239)  HYDROmorphone  (DILAUDID ) injection 0.5 mg (0.5 mg Intravenous Given 08/06/23 2239)    ED Course/ Medical Decision Making/ A&P                          Medical Decision Making Amount and/or Complexity of Data Reviewed Labs: ordered. Decision-making details documented in ED Course. Radiology: ordered.  Risk Prescription drug management. Decision regarding hospitalization.    This patient presents to the ED for concern of N/V abd pain, this involves an extensive number of treatment options,  and is a complaint that carries with it a high risk of complications and morbidity.  I considered the following differential and admission for this acute, potentially life threatening condition.   MDM:    For DDX for abdominal pain includes but is not limited to:  Abdominal exam without peritoneal signs. No evidence of acute abdomen at this time.  Greatest concern for bowel obstruction given constellation of symptoms.  Consider electrolyte derangements or renal injury as well given significant dehydration. Will give fluids/zofran /pain meds. Considered also but lower suspicion for acute hepatobiliary disease (including acute cholecystitis or cholangitis), acute pancreatitis (neg lipase), PUD (including gastric perforation), acute appendicitis, diverticulitis.    Clinical  Course as of 08/06/23 2244  Thu Aug 06, 2023  2204 WBC: 8.0 No leukocytosis  [HN]  2223 Creatinine(!): 4.59 +significant AKI, likely prerenal [HN]  2243 Lipase: 31 [HN]    Clinical Course User Index [HN] Merdis Stalling, MD    Labs: I Ordered, and personally interpreted labs.  The pertinent results include:  those listed above  Imaging Studies ordered: I ordered imaging studies including CT abd pelvis  Additional history obtained from chart review  Cardiac Monitoring: The patient was maintained on a cardiac monitor.  I personally viewed and interpreted the cardiac monitored which showed an underlying rhythm of: normal sinus rhythm  Social Determinants of Health: Lives independently  Disposition:  Patient is signed out to the oncoming ED physician Dr. Harless Lien who is made aware of her history, presentation, exam, workup, and plan. Plan will be to get CT abd pelvis and likely admit   Co morbidities that complicate the patient evaluation  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HCC)    Anemia    Chronic kidney disease    Essential hypertension 05/07/2021   GERD (gastroesophageal reflux  disease)    Hernia, epigastric    Hypertension    PAF (paroxysmal atrial fibrillation) (HCC) 05/07/2021   Pure hypercholesterolemia 05/07/2021     Medicines Meds ordered this encounter  Medications   lactated ringers  bolus 1,000 mL   ondansetron  (ZOFRAN ) injection 4 mg   HYDROmorphone  (DILAUDID ) injection 0.5 mg   lactated ringers  bolus 1,000 mL    I have reviewed the patients home medicines and have made adjustments as needed  Problem List / ED Course: Problem List Items Addressed This Visit       Genitourinary   AKI (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 - Primary   Other Visit Diagnoses       Nausea and vomiting, unspecified vomiting type                       This note was created using dictation software, which may contain spelling or grammatical errors.    Merdis Stalling, MD 08/06/23 [phone]

## 2023-08-06 NOTE — ED Provider Notes (Signed)
  Provider Note MRN:  [id number]  Arrival date & time: 08/07/23    ED Course and Medical Decision Making  Assumed care of patient at sign-out or upon transfer.  Nausea vomiting, concern for bowel obstruction, also with acute kidney injury.  Will need admission, awaiting CT imaging.  CT confirms high-grade obstruction, Dr. Collene Dawson is consulted for general surgery recommendations, admitted to medicine.  NG tube placed.  .Critical Care  Performed by: Edson Graces, MD Authorized by: Edson Graces, MD   Critical care provider statement:    Critical care time (minutes):  35   Critical care was necessary to treat or prevent imminent or life-threatening deterioration of the following conditions: Small bowel obstruction.   Critical care was time spent personally by me on the following activities:  Development of treatment plan with patient or surrogate, discussions with consultants, evaluation of patient's response to treatment, examination of patient, ordering and review of laboratory studies, ordering and review of radiographic studies, ordering and performing treatments and interventions, pulse oximetry, re-evaluation of patient's condition and review of old charts   Final Clinical Impressions(s) / ED Diagnoses     ICD-10-CM   1. AKI (acute kidney injury) (HCC)  N17.9     2. Nausea and vomiting, unspecified vomiting type  R11.2       ED Discharge Orders     None       Discharge Instructions   None     Merrick Abe. Harless Lien, MD [facility] Medicine [facility] [email or web address]    Edson Graces, MD 08/07/23 [phone]

## 2023-08-06 NOTE — ED Triage Notes (Addendum)
 Pt bib EMS after having c/o nausea and Emesis since Monday after eating some Oodles and Noodles. Pt also c/o abdominal pain she describes as cramping in nature. Pt reports she is unable to keep anything down. EMS states pt's SBP was in 70's upon their arrival and that they administered 250ml NS blous and was able to bring SBP up to 100's.

## 2023-08-07 ENCOUNTER — Inpatient Hospital Stay (HOSPITAL_COMMUNITY)

## 2023-08-07 ENCOUNTER — Encounter: Payer: Self-pay | Admitting: Internal Medicine

## 2023-08-07 ENCOUNTER — Ambulatory Visit: Payer: Commercial Managed Care - PPO | Admitting: Internal Medicine

## 2023-08-07 DIAGNOSIS — E66811 Obesity, class 1: Secondary | ICD-10-CM | POA: Diagnosis not present

## 2023-08-07 DIAGNOSIS — K559 Vascular disorder of intestine, unspecified: Secondary | ICD-10-CM | POA: Diagnosis present

## 2023-08-07 DIAGNOSIS — K56609 Unspecified intestinal obstruction, unspecified as to partial versus complete obstruction: Secondary | ICD-10-CM | POA: Diagnosis present

## 2023-08-07 DIAGNOSIS — Z933 Colostomy status: Secondary | ICD-10-CM | POA: Diagnosis not present

## 2023-08-07 DIAGNOSIS — I480 Paroxysmal atrial fibrillation: Secondary | ICD-10-CM | POA: Diagnosis not present

## 2023-08-07 DIAGNOSIS — I4892 Unspecified atrial flutter: Secondary | ICD-10-CM | POA: Diagnosis present

## 2023-08-07 DIAGNOSIS — I4891 Unspecified atrial fibrillation: Secondary | ICD-10-CM | POA: Diagnosis not present

## 2023-08-07 DIAGNOSIS — R1084 Generalized abdominal pain: Secondary | ICD-10-CM | POA: Diagnosis not present

## 2023-08-07 DIAGNOSIS — R7989 Other specified abnormal findings of blood chemistry: Secondary | ICD-10-CM | POA: Diagnosis present

## 2023-08-07 DIAGNOSIS — I5030 Unspecified diastolic (congestive) heart failure: Secondary | ICD-10-CM | POA: Diagnosis present

## 2023-08-07 DIAGNOSIS — I130 Hypertensive heart and chronic kidney disease with heart failure and stage 1 through stage 4 chronic kidney disease, or unspecified chronic kidney disease: Secondary | ICD-10-CM | POA: Diagnosis present

## 2023-08-07 DIAGNOSIS — E873 Alkalosis: Secondary | ICD-10-CM | POA: Diagnosis not present

## 2023-08-07 DIAGNOSIS — R112 Nausea with vomiting, unspecified: Secondary | ICD-10-CM | POA: Diagnosis not present

## 2023-08-07 DIAGNOSIS — I422 Other hypertrophic cardiomyopathy: Secondary | ICD-10-CM | POA: Diagnosis present

## 2023-08-07 DIAGNOSIS — K435 Parastomal hernia without obstruction or  gangrene: Secondary | ICD-10-CM | POA: Diagnosis present

## 2023-08-07 DIAGNOSIS — Z932 Ileostomy status: Secondary | ICD-10-CM | POA: Diagnosis not present

## 2023-08-07 DIAGNOSIS — K430 Incisional hernia with obstruction, without gangrene: Secondary | ICD-10-CM | POA: Diagnosis not present

## 2023-08-07 DIAGNOSIS — Z01810 Encounter for preprocedural cardiovascular examination: Secondary | ICD-10-CM | POA: Diagnosis not present

## 2023-08-07 DIAGNOSIS — D62 Acute posthemorrhagic anemia: Secondary | ICD-10-CM | POA: Diagnosis not present

## 2023-08-07 DIAGNOSIS — I7143 Infrarenal abdominal aortic aneurysm, without rupture: Secondary | ICD-10-CM | POA: Diagnosis not present

## 2023-08-07 DIAGNOSIS — I2510 Atherosclerotic heart disease of native coronary artery without angina pectoris: Secondary | ICD-10-CM | POA: Diagnosis not present

## 2023-08-07 DIAGNOSIS — E861 Hypovolemia: Secondary | ICD-10-CM | POA: Diagnosis present

## 2023-08-07 DIAGNOSIS — N184 Chronic kidney disease, stage 4 (severe): Secondary | ICD-10-CM

## 2023-08-07 DIAGNOSIS — N179 Acute kidney failure, unspecified: Secondary | ICD-10-CM

## 2023-08-07 DIAGNOSIS — F17210 Nicotine dependence, cigarettes, uncomplicated: Secondary | ICD-10-CM | POA: Diagnosis not present

## 2023-08-07 DIAGNOSIS — K433 Parastomal hernia with obstruction, without gangrene: Secondary | ICD-10-CM | POA: Diagnosis present

## 2023-08-07 DIAGNOSIS — K630 Abscess of intestine: Secondary | ICD-10-CM | POA: Diagnosis present

## 2023-08-07 DIAGNOSIS — R109 Unspecified abdominal pain: Secondary | ICD-10-CM | POA: Diagnosis present

## 2023-08-07 DIAGNOSIS — I5032 Chronic diastolic (congestive) heart failure: Secondary | ICD-10-CM | POA: Diagnosis not present

## 2023-08-07 DIAGNOSIS — K631 Perforation of intestine (nontraumatic): Secondary | ICD-10-CM | POA: Diagnosis present

## 2023-08-07 DIAGNOSIS — R9431 Abnormal electrocardiogram [ECG] [EKG]: Secondary | ICD-10-CM | POA: Diagnosis not present

## 2023-08-07 DIAGNOSIS — E860 Dehydration: Secondary | ICD-10-CM | POA: Diagnosis present

## 2023-08-07 DIAGNOSIS — R0789 Other chest pain: Secondary | ICD-10-CM | POA: Diagnosis not present

## 2023-08-07 DIAGNOSIS — E66812 Obesity, class 2: Secondary | ICD-10-CM | POA: Diagnosis present

## 2023-08-07 DIAGNOSIS — E870 Hyperosmolality and hypernatremia: Secondary | ICD-10-CM | POA: Diagnosis not present

## 2023-08-07 DIAGNOSIS — I2489 Other forms of acute ischemic heart disease: Secondary | ICD-10-CM | POA: Diagnosis present

## 2023-08-07 DIAGNOSIS — I471 Supraventricular tachycardia, unspecified: Secondary | ICD-10-CM | POA: Diagnosis not present

## 2023-08-07 DIAGNOSIS — K651 Peritoneal abscess: Secondary | ICD-10-CM | POA: Diagnosis present

## 2023-08-07 DIAGNOSIS — E876 Hypokalemia: Secondary | ICD-10-CM | POA: Diagnosis not present

## 2023-08-07 DIAGNOSIS — K565 Intestinal adhesions [bands], unspecified as to partial versus complete obstruction: Secondary | ICD-10-CM | POA: Diagnosis not present

## 2023-08-07 DIAGNOSIS — I4819 Other persistent atrial fibrillation: Secondary | ICD-10-CM | POA: Diagnosis present

## 2023-08-07 DIAGNOSIS — I10 Essential (primary) hypertension: Secondary | ICD-10-CM | POA: Diagnosis not present

## 2023-08-07 DIAGNOSIS — K219 Gastro-esophageal reflux disease without esophagitis: Secondary | ICD-10-CM | POA: Diagnosis present

## 2023-08-07 DIAGNOSIS — E7800 Pure hypercholesterolemia, unspecified: Secondary | ICD-10-CM | POA: Diagnosis present

## 2023-08-07 DIAGNOSIS — E871 Hypo-osmolality and hyponatremia: Secondary | ICD-10-CM | POA: Diagnosis present

## 2023-08-07 LAB — COMPREHENSIVE METABOLIC PANEL WITH GFR
ALT: 17 U/L (ref 0–44)
AST: 17 U/L (ref 15–41)
Albumin: 3.8 g/dL (ref 3.5–5.0)
Alkaline Phosphatase: 86 U/L (ref 38–126)
Anion gap: 15 (ref 5–15)
BUN: 58 mg/dL — ABNORMAL HIGH (ref 8–23)
CO2: 24 mmol/L (ref 22–32)
Calcium: 8.7 mg/dL — ABNORMAL LOW (ref 8.9–10.3)
Chloride: 96 mmol/L — ABNORMAL LOW (ref 98–111)
Creatinine, Ser: 4.49 mg/dL — ABNORMAL HIGH (ref 0.44–1.00)
GFR, Estimated: 11 mL/min — ABNORMAL LOW (ref >60)
Glucose, Bld: 139 mg/dL — ABNORMAL HIGH (ref 70–99)
Potassium: 3.8 mmol/L (ref 3.5–5.1)
Sodium: 135 mmol/L (ref 135–145)
Total Bilirubin: 1 mg/dL (ref 0.0–1.2)
Total Protein: 7.1 g/dL (ref 6.5–8.1)

## 2023-08-07 LAB — MAGNESIUM: Magnesium: 2 mg/dL (ref 1.7–2.4)

## 2023-08-07 LAB — CBC
HCT: 35.7 % — ABNORMAL LOW (ref 36.0–46.0)
Hemoglobin: 11.8 g/dL — ABNORMAL LOW (ref 12.0–15.0)
MCH: 32.3 pg (ref 26.0–34.0)
MCHC: 33.1 g/dL (ref 30.0–36.0)
MCV: 97.8 fL (ref 80.0–100.0)
Platelets: 249 10*3/uL (ref 150–400)
RBC: 3.65 MIL/uL — ABNORMAL LOW (ref 3.87–5.11)
RDW: 14.9 % (ref 11.5–15.5)
WBC: 8.6 10*3/uL (ref 4.0–10.5)
nRBC: 0 % (ref 0.0–0.2)

## 2023-08-07 LAB — PHOSPHORUS: Phosphorus: 5.4 mg/dL — ABNORMAL HIGH (ref 2.5–4.6)

## 2023-08-07 LAB — HIV ANTIBODY (ROUTINE TESTING W REFLEX): HIV Screen 4th Generation wRfx: NONREACTIVE

## 2023-08-07 MED ORDER — LACTATED RINGERS IV SOLN: INTRAVENOUS | Status: DC

## 2023-08-07 MED ORDER — ACETAMINOPHEN 325 MG PO TABS
650.0000 mg | ORAL_TABLET | Freq: Four times a day (QID) | ORAL | Status: DC | PRN
  Administered 2023-08-16: 650 mg
  Filled 2023-08-07: qty 2

## 2023-08-07 MED ORDER — ACETAMINOPHEN 650 MG RE SUPP
650.0000 mg | Freq: Four times a day (QID) | RECTAL | Status: DC | PRN
  Filled 2023-08-07: qty 1

## 2023-08-07 MED ORDER — ONDANSETRON HCL 4 MG PO TABS
4.0000 mg | ORAL_TABLET | Freq: Four times a day (QID) | ORAL | Status: DC | PRN
  Filled 2023-08-07: qty 1

## 2023-08-07 MED ORDER — HYDROMORPHONE HCL 1 MG/ML IJ SOLN
0.5000 mg | INTRAMUSCULAR | Status: DC | PRN
  Administered 2023-08-07 – 2023-08-14 (×34): 0.5 mg via INTRAVENOUS
  Filled 2023-08-07 (×35): qty 0.5

## 2023-08-07 MED ORDER — ONDANSETRON HCL 4 MG/2ML IJ SOLN
4.0000 mg | Freq: Four times a day (QID) | INTRAMUSCULAR | Status: DC | PRN
  Administered 2023-08-07 – 2023-08-08 (×3): 4 mg via INTRAVENOUS
  Filled 2023-08-07 (×3): qty 2
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
## 2023-08-07 NOTE — Progress Notes (Signed)
 Patient ngt hooked to LIWS dark brown foul smelling liquid suctioning from tube. Patient is non compliant with hob at 30 + degrees states she is not comfortable sitting up and flatens her bead and rolls to both right and left sides on her own. Advised patient of danger of aspiration and risks involved patient remains defiant

## 2023-08-07 NOTE — Plan of Care (Signed)
   Problem: Education: Goal: Knowledge of General Education information will improve Description: Including pain rating scale, medication(s)/side effects and non-pharmacologic comfort measures Outcome: Progressing   Problem: Clinical Measurements: Goal: Ability to maintain clinical measurements within normal limits will improve Outcome: Progressing Goal: Diagnostic test results will improve Outcome: Progressing
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
## 2023-08-07 NOTE — TOC CM/SW Note (Signed)
 Transition of Care [facility]) - Inpatient Brief Assessment   Patient Details  Name: Dorothy Harris MRN: [id number] Date of Birth: 06-26-1960  Transition of Care [facility]) CM/SW Contact:    Cyndie Dredge, LCSWA Phone Number: 08/07/2023, 7:30 AM   Clinical Narrative:  Transition of Care Department [facility]) has reviewed patient and no TOC needs have been identified at this time. We will continue to monitor patient advancement through interdisciplinary progression rounds. If new patient transition needs arise, please place a TOC consult.  Transition of Care Asessment: Insurance and Status: Insurance coverage has been reviewed Patient has primary care physician: Yes Home environment has been reviewed: Single Family Home Prior level of function:: Independent Prior/Current Home Services: No current home services Social Drivers of Health Review: SDOH reviewed no interventions necessary Readmission risk has been reviewed: Yes Transition of care needs: no transition of care needs at this time
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
## 2023-08-07 NOTE — Hospital Course (Addendum)
 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 EMS was activated and arrival of EMS team, patient was hypotensive with SBP in 70s, IV NS 250 mL bolus was given with SBP improving to 100's In [id number] stable. Labs-normal CBC, BMP showed AKI bicarb 19,Troponin 93 > 86. CT abdomen and pelvis>>high-grade small bowel obstruction with transition point likely somewhere in the distal third of the ileum.  Patient was given antibiotics IV x1 and placed on fluids, NG tube placement w/ drainage of 650 mL of greenish fluid and admitted.

## 2023-08-07 NOTE — Progress Notes (Signed)
 Patient seen and examined personally, I reviewed the chart, history and physical and admission note, done by admitting physician this morning and agree with the same with following addendum.  Please refer to the morning admission note for more detailed plan of care.  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 EMS was activated and arrival of EMS team, patient was hypotensive with SBP in 70s, IV NS 250 mL bolus was given with SBP improving to 100's In [id number] stable. Labs-normal CBC, BMP showed AKI bicarb 19,Troponin 93 > 86. CT abdomen and pelvis>>high-grade small bowel obstruction with transition point likely somewhere in the distal third of the ileum Patient was given antibiotics IV x1 and placed on fluids, NG tube placement w/ drainage of 650 mL of greenish fluid and admitted  Subjective: Seen and examined this morning Reports he feels better Still no output on colostomy NG tube in place  overnight afebrile BP stable Labs reviewed creatinine slightly better 4.4 NGT output 1450 cc.  On exam alert awake oriented Lungs clear to auscultation Colostomy in place with a small amount of liquid output w/ protuberant abdomen, soft S1-S2 No edema in legs/moving well  Assessment and plan:  SBO high-grade Ischemic Colitis s/p hemi-colectomy with Ileostomy (03/2021):  Presented with high-grade SBO in the setting of previous abdominal surgeries with ileostomy in place. Continue NGT decompression n.p.o. IV fluids, multimodal pain management Surgery has been consulted  AKI on CKD 4 baseline creatinine 1.9-2.2 W/ EGFR 24 Metabolic acidosis due to AKI resolved: On admission BUN 47  creatinine 4.5, this is 2/2 SBO/volume depletion. continue IV fluid hydration LR 125 cc/h, avoid nephrotoxic medication monitor urine output.  Will discuss with nephrology Recent Labs    05/05/23 0830 08/06/23 2148 08/07/23 0501  BUN 31* 57* 58*  CREATININE 1.97* 4.59* 4.49*  CO2 15* 19* 24  K 5.2 4.0 3.8     Mild chronic anemia: Trend HB  Elevated troponin: No chest pain suspect demand ischemia due to UTI bowel obstruction.  Troponin flat 93 > 86   Essential hypertension: BP stable. Continue amlodipine  as able.   Paroxysmal A-fib: Continue home Amiodarone , Metoprolol    Obesity class I (BMI 30.73) Will need outpatient diet changes, lifestyle modification    HFpEF w/ tte 8/24-EF 65 to 70%, G1DD: Currently volume depleted needing IV fluids due to SBO.
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
## 2023-08-08 DIAGNOSIS — Z932 Ileostomy status: Secondary | ICD-10-CM | POA: Diagnosis not present

## 2023-08-08 DIAGNOSIS — K435 Parastomal hernia without obstruction or  gangrene: Secondary | ICD-10-CM | POA: Diagnosis not present

## 2023-08-08 DIAGNOSIS — K56609 Unspecified intestinal obstruction, unspecified as to partial versus complete obstruction: Secondary | ICD-10-CM | POA: Diagnosis not present

## 2023-08-08 DIAGNOSIS — K430 Incisional hernia with obstruction, without gangrene: Secondary | ICD-10-CM | POA: Diagnosis not present

## 2023-08-08 LAB — CBC
HCT: 38.8 % (ref 36.0–46.0)
Hemoglobin: 12.3 g/dL (ref 12.0–15.0)
MCH: 31.1 pg (ref 26.0–34.0)
MCHC: 31.7 g/dL (ref 30.0–36.0)
MCV: 98 fL (ref 80.0–100.0)
Platelets: 319 10*3/uL (ref 150–400)
RBC: 3.96 MIL/uL (ref 3.87–5.11)
RDW: 14.8 % (ref 11.5–15.5)
WBC: 10 10*3/uL (ref 4.0–10.5)
nRBC: 0.3 % — ABNORMAL HIGH (ref 0.0–0.2)

## 2023-08-08 LAB — BASIC METABOLIC PANEL WITH GFR
Anion gap: 22 — ABNORMAL HIGH (ref 5–15)
BUN: 85 mg/dL — ABNORMAL HIGH (ref 8–23)
CO2: 31 mmol/L (ref 22–32)
Calcium: 8.3 mg/dL — ABNORMAL LOW (ref 8.9–10.3)
Chloride: 88 mmol/L — ABNORMAL LOW (ref 98–111)
Creatinine, Ser: 6.4 mg/dL — ABNORMAL HIGH (ref 0.44–1.00)
GFR, Estimated: 7 mL/min — ABNORMAL LOW (ref >60)
Glucose, Bld: 158 mg/dL — ABNORMAL HIGH (ref 70–99)
Potassium: 3.7 mmol/L (ref 3.5–5.1)
Sodium: 141 mmol/L (ref 135–145)

## 2023-08-08 MED ORDER — SODIUM CHLORIDE 0.9 % IV SOLN: INTRAVENOUS | Status: DC

## 2023-08-08 MED ORDER — HYDRALAZINE HCL 20 MG/ML IJ SOLN: 10.0000 mg | Freq: Four times a day (QID) | INTRAMUSCULAR | Status: DC | PRN

## 2023-08-08 MED ORDER — PHENOL 1.4 % MT LIQD
1.0000 | OROMUCOSAL | Status: DC | PRN
  Administered 2023-08-13 – 2023-08-14 (×2): 1 via OROMUCOSAL
  Filled 2023-08-08: qty 177

## 2023-08-08 MED ORDER — METOPROLOL TARTRATE 5 MG/5ML IV SOLN
5.0000 mg | Freq: Four times a day (QID) | INTRAVENOUS | Status: DC | PRN
  Administered 2023-08-11 – 2023-08-12 (×4): 5 mg via INTRAVENOUS
  Filled 2023-08-08 (×4): qty 5

## 2023-08-08 NOTE — Progress Notes (Signed)
 [facility] Progress Note     Subjective: AKI worsening. Dr. Quintella Buck thinks dry- I agree esp with NG output. Patient has no output from ileostomy.  Told her I could not find any colonoscopy records. She is unsure.   Objective: Vital signs in last 24 hours: Temp:  [98 F (36.7 C)-100.1 F (37.8 C)] 98.8 F (37.1 C) (05/03 1638) Pulse Rate:  [97-105] 101 (05/03 1638) Resp:  [18-24] 20 (05/03 1638) BP: (100-117)/(68-83) 111/74 (05/03 1638) SpO2:  [90 %-97 %] 96 % (05/03 1638) Weight:  [86.3 kg] 86.3 kg (05/03 0407) Last BM Date : 08/03/23 (per patient report)  Intake/Output from previous day: 05/02 0701 - 05/03 0700 In: 0  Out: 1400 [Emesis/NG output:1400] Intake/Output this shift: Total I/O In: 1232.9 [P.O.:300; I.V.:932.9] Out: 1250 [Emesis/NG output:1250]  General appearance: alert and no distress GI: soft, parastomal hernia soft, ostomy bag with no output, midline hernia soft, reducible   Lab Results:  Recent Labs    08/07/23 0501 08/08/23 0515  WBC 8.6 10.0  HGB 11.8* 12.3  HCT 35.7* 38.8  PLT 249 319   BMET Recent Labs    08/07/23 0501 08/08/23 0515  NA 135 141  K 3.8 3.7  CL 96* 88*  CO2 24 31  GLUCOSE 139* 158*  BUN 58* 85*  CREATININE 4.49* 6.40*  CALCIUM  8.7* 8.3*   PT/INR No results for input(s): "LABPROT", "INR" in the last 72 hours.  Studies/Results: DG Abd Portable 1 View Result Date: 08/07/2023 EXAM: 1 VIEW XRAY OF THE ABDOMEN SUPINE 08/07/2023 02:27:00 AM COMPARISON: Earlier today. CLINICAL HISTORY: NG tube placement. NG tube placement IMAGE #2 repositioned NG. FINDINGS: BOWEL: The bowel gas pattern is nonspecific. No bowel obstruction. PERITONEUM AND SOFT TISSUES: No abnormal calcifications. BONES: No acute osseous abnormality. LINES AND TUBES: Enteric tube terminates in the distal gastric antrum. IMPRESSION: 1. Enteric tube terminates in the distal gastric antrum. Electronically signed by: Zadie Herter MD 08/07/2023  02:48 AM EDT RP Workstation: [id number]   DG Abd Portable 1 View Result Date: 08/07/2023 EXAM: 1 VIEW XRAY OF THE ABDOMEN SUPINE 08/07/2023 01:53:00 AM COMPARISON: CT abdomen / pelvis dated 08/06/2023. CLINICAL HISTORY: NG tube placement. FINDINGS: BOWEL: Dilated loops of small bowel in the central abdomen, suggesting small bowel obstruction. PERITONEUM AND SOFT TISSUES: No abnormal calcifications. BONES: No acute osseous abnormality. LINES AND TUBES: Enteric tube terminates in the proximal duodenum. VASCULATURE: Thoracic aortic stent. MEDIASTINUM: Median sternotomy. Postsurgical changes related to prior CABG (coronary artery bypass graft). IMPRESSION: 1. Enteric tube terminates in the proximal duodenum. 2. Dilated loops of small bowel in the central abdomen, suggesting small bowel obstruction. Electronically signed by: Zadie Herter MD 08/07/2023 01:58 AM EDT RP Workstation: [id number]   CT ABDOMEN PELVIS WO CONTRAST Result Date: 08/07/2023 CLINICAL DATA:  Abdominal hernia with small bowel obstruction. Unable to keep anything down. EXAM: CT ABDOMEN AND PELVIS WITHOUT CONTRAST TECHNIQUE: Multidetector CT imaging of the abdomen and pelvis was performed following the standard protocol without IV contrast. RADIATION DOSE REDUCTION: This exam was performed according to the departmental dose-optimization program which includes automated exposure control, adjustment of the mA and/or kV according to patient size and/or use of iterative reconstruction technique. COMPARISON:  CTA chest, abdomen and pelvis 10/02/2022, CT abdomen and pelvis without contrast 06/01/2021. FINDINGS: Lower chest: Cardiac size is normal. Lung bases are clear with mild chronic elevation of the right hemidiaphragm. Sternotomy sutures are again noted with partially visualized ascending aortic tube graft repair and a partially visualized descending aortic  stent graft. Maximum descending aortic caliber 4.6 cm which seems unchanged. Hepatobiliary:  Shrinking cyst noted in hepatic segment 1 measuring 1.5 cm, previously 3.1 cm. Otherwise no focal liver abnormality is seen without contrast. No gallstones, gallbladder wall thickening, or biliary dilatation. Pancreas: Partially atrophic. No focal abnormality is seen without contrast. Spleen: Unremarkable. Adrenals/Urinary Tract: Stable 1.8 cm left adrenal genu nodule. No right adrenal abnormality. No contour deforming mass of the kidneys is seen. There is no urinary stone or obstruction. Bladder is unremarkable for the degree of distension. Stomach/Bowel: The stomach is mildly distended with backed up fluid. No wall thickening. High-grade small-bowel obstruction. There is diffuse dilatation up to 5 cm, obstruction point likely somewhere in the distal third of the ileum. The terminal ileal segments are collapsed and contained within a peristomal hernia in the anterolateral right mid abdomen where there is a loop ileostomy. There has been a prior right hemicolectomy with decompressed Hartmann's pouch beginning in the distal transverse colon. There is a second, larger hernia sac medial to the peristomal hernia with the hernia mouth 5 cm diameter. The transitional segment in this case is draped over the lateral wall of the mouth of this second hernia, best seen on 2:50 and adjacent images. This hernia sac contains dilated as well as decompressed segments and trace fluid, with the hernia mouth extending to the umbilicus. Above this level are several supraumbilical right and left of midline fat hernias but no other hernias contain bowel. No bowel pneumatosis, free air or portal venous gas are seen Vascular/Lymphatic: Patchy aortoiliac atherosclerosis. 5.2 cm infrarenal fusiform AAA is again noted and is unchanged. No adenopathy. Reproductive: Status post hysterectomy. No adnexal masses. Other: Mild scattered fluid in the mesenteric folds, minimal fluid in hernia sacs. Trace ascites in pelvis. No free air. There is  subcutaneous stranding in right upper abdominal wall above the level of hernias, which could be traumatic or due to cellulitis. Small inguinal fat hernias are again noted.  There is no free air. Musculoskeletal: Degenerative changes lumbar spine including advanced facet hypertrophy L4-5, degenerative disc space loss L5-S1 with spondylosis. No acute or other significant osseous findings. Ankylosis right SI joint. IMPRESSION: 1. High-grade small-bowel obstruction with transition point likely somewhere in the distal third of the ileum. 2. The terminal ileal segments are collapsed and contained within a peristomal hernia in the anterolateral right mid abdomen where there is a loop ileostomy. 3. There is a second, larger hernia sac medial to the peristomal hernia with the hernia mouth 5 cm diameter. The transitional segment in this case is draped over the lateral wall of the mouth of this second hernia. 4. No bowel pneumatosis, free air or portal venous gas are seen. 5. Mild scattered fluid in the mesenteric folds and in both hernia sacs, trace ascites in the pelvis. 6. Subcutaneous stranding in the right upper abdominal wall above the level of hernias, which could be traumatic or due to cellulitis. 7. Aortic atherosclerosis with 5.2 cm infrarenal fusiform AAA, unchanged. Follow-up as indicated. 8. Prior right hemicolectomy with decompressed Hartmann's pouch beginning in the distal transverse colon. 9. Stable 1.8 cm left adrenal genu nodule. Aortic Atherosclerosis (ICD10-I70.0). Electronically Signed   By: Denman Fischer M.D.   On: 08/07/2023 00:19    Anti-infectives: Anti-infectives (From admission, onward)    None       Assessment/Plan: Patient with SBO associated with ventral hernia, also has end ileostomy and parastomal hernia. AKI worsening. IVF being given. Will monitor. Abd exam reassuring,  hernia is reducible. I agree the AKI likely from her dehydration from the SBO and now from NG losses too. With  her history and AKI, trying to avoid surgery as I think she would be best served to have a reversal, but agree she needs a colonoscopy and things checked prior to a reversal occurring.     LOS: 1 day    Awilda Bogus 08/08/2023

## 2023-08-08 NOTE — Plan of Care (Signed)

## 2023-08-08 NOTE — Progress Notes (Signed)
 PROGRESS NOTE   Dorothy Harris, is a 63 y.o. 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 -Discussed with general surgeon Dr. Collene Dawson--- the hernia appears to be reducible per general surgeon Continue NGT decompression - Continue IV fluids while NPO - If patient fails conservative approach--general surgeon believes that ideally patient should have a colonoscopy prior to surgical intervention with lysis of adhesions and possible reversal of ileostomy - 2) AKI----acute kidney injury on CKD stage IV -  -  creatinine on admission= 4.59,  baseline creatinine = 2.0 to 2.2  ,  -creatinine is now= 6.40 ([place]) - Nephrology consult appreciated - No metabolic acidosis and no hyperkalemia at this time -Renally adjust medications, avoid nephrotoxic agents / dehydration  / hypotension  3)Elevated troponin: No chest pain, EKG nonacute suspect demand ischemia due to AKI and  bowel obstruction.  Troponin flat 93 > 86   Essential hypertension: BP stable.  - PTA amlodipine  and oral hydralazine  on hold -May use IV hydralazine  as needed elevated BP   Paroxysmal A-fib: -PTA amiodarone  and Coreg  on hold due  to n.p.o. status -May use IV metoprolol  as needed elevated heart rate -Patient was not on anticoagulation PTA   HFpEF w/ tte 8/24-EF 65 to 70%, G1DD: Currently volume depleted needing IV fluids due to SBO.   - Be judicious with IV fluids  HLD-hold PTA atorvastatin  while n.p.o.   Status is: Inpatient   Disposition: The patient is from: Home              Anticipated d/c is to: Home              Anticipated d/c date is: > 3 days              Patient currently is not medically stable to d/c. Barriers: Not Clinically Stable-   Code Status :  -  Code Status: Full Code   Family Communication:   (patient is alert, awake and coherent)  Discussed with Niece at bedside  DVT Prophylaxis  :   - SCDs   SCDs Start: 08/07/23 0437   Lab Results  Component Value Date   PLT 319 08/08/2023   Inpatient Medications  Scheduled Meds: Continuous Infusions:  sodium chloride  150 mL/hr at 08/08/23 1613   PRN Meds:.acetaminophen  **OR** acetaminophen , HYDROmorphone  (DILAUDID ) injection, ondansetron  **OR** ondansetron  (ZOFRAN ) IV   Anti-infectives (From admission, onward)    None       Subjective: Dorothy Harris today has no fevers, no emesis,  No chest pain,   - Niece Adell Hones is at bedside - Gastric output from NG tube noted   Objective: Vitals:   08/08/23 1232 08/08/23 1240 08/08/23 1429 08/08/23 1638  BP:  105/72  110/83 111/74  Pulse: 97 (!) 101 (!) 101 (!) 101  Resp: 19 18 20 20   Temp: 99.1 F (37.3 C) 99.1 F (37.3 C) 98.2 F (36.8 C) 98.8 F (37.1 C)  TempSrc: Oral Oral Oral Oral  SpO2:  93% 97% 96%  Weight:      Height:        Intake/Output Summary (Last 24 hours) at 08/08/2023 1756 Last data filed at 08/08/2023 1639 Gross per 24 hour  Intake 1232.91 ml  Output 1850 ml  Net -617.09 ml   Filed Weights   08/06/23 2130 08/08/23 0407  Weight: 89 kg 86.3 kg    Physical Exam  Gen:- Awake Alert,  in no apparent distress  HEENT:- [place].AT, No sclera icterus Nose- NG tube  gastric drainage Neck-Supple Neck,No JVD,.  Lungs-  CTAB , fair symmetrical air movement CV- S1, S2 normal, regular  Abd-diminished B.Sounds, Abd Soft, No significant tenderness, ileostomy bag without significant output, ventral hernia and parastomal hernia noted Extremity/Skin:- No  edema, pedal pulses present  Psych-affect is appropriate, oriented x3 Neuro-no new focal deficits, no tremors  Data Reviewed: I have personally reviewed following labs and imaging studies  CBC: Recent Labs  Lab 08/06/23 2148 08/07/23 0501 08/08/23 0515  WBC 8.0 8.6 10.0  NEUTROABS 5.0  --   --   HGB 13.0 11.8* 12.3  HCT 40.3 35.7* 38.8  MCV 98.1 97.8 98.0  PLT 290 249 319   Basic Metabolic Panel: Recent Labs  Lab 08/06/23 2148 08/07/23 0501 08/08/23 0515  NA 134* 135 141  K 4.0 3.8 3.7  CL 96* 96* 88*  CO2 19* 24 31  GLUCOSE 189* 139* 158*  BUN 57* 58* 85*  CREATININE 4.59* 4.49* 6.40*  CALCIUM  9.2 8.7* 8.3*  MG  --  2.0  --   PHOS  --  5.4*  --    GFR: Estimated Creatinine Clearance: 10.3 mL/min (A) (by C-G formula based on SCr of 6.4 mg/dL (H)). Liver Function Tests: Recent Labs  Lab 08/06/23 2148 08/07/23 0501  AST 23 17  ALT 20 17  ALKPHOS 100 86  BILITOT 0.8 1.0  PROT 8.1 7.1  ALBUMIN  4.2 3.8   Radiology Studies: DG Abd Portable 1 View Result Date: 08/07/2023 EXAM: 1 VIEW XRAY OF THE ABDOMEN SUPINE 08/07/2023 02:27:00 AM COMPARISON: Earlier today. CLINICAL HISTORY: NG tube placement. NG tube placement IMAGE #2 repositioned NG. FINDINGS: BOWEL: The bowel gas pattern is nonspecific. No bowel obstruction. PERITONEUM AND SOFT TISSUES: No abnormal calcifications. BONES: No acute osseous abnormality. LINES AND TUBES: Enteric tube terminates in the distal gastric antrum. IMPRESSION: 1. Enteric tube terminates in the distal gastric antrum. Electronically signed by: Zadie Herter MD 08/07/2023 02:48 AM EDT RP Workstation: [id number]   DG Abd Portable 1 View Result Date:  08/07/2023 EXAM: 1 VIEW XRAY OF THE ABDOMEN SUPINE 08/07/2023 01:53:00 AM COMPARISON: CT abdomen / pelvis dated 08/06/2023. CLINICAL HISTORY: NG tube placement. FINDINGS: BOWEL: Dilated loops of small bowel in the central abdomen, suggesting small bowel obstruction. PERITONEUM AND SOFT TISSUES: No abnormal calcifications. BONES: No acute osseous abnormality. LINES AND TUBES: Enteric tube terminates in the proximal duodenum. VASCULATURE: Thoracic aortic stent. MEDIASTINUM: Median sternotomy. Postsurgical changes related to prior CABG (coronary artery bypass graft). IMPRESSION: 1. Enteric tube terminates in the proximal duodenum. 2. Dilated loops of small bowel in the central abdomen, suggesting small bowel obstruction. Electronically signed by: Zadie Herter MD 08/07/2023 01:58 AM EDT RP Workstation: [id number]   CT ABDOMEN PELVIS WO CONTRAST  Result Date: 08/07/2023 CLINICAL DATA:  Abdominal hernia with small bowel obstruction. Unable to keep anything down. EXAM: CT ABDOMEN AND PELVIS WITHOUT CONTRAST TECHNIQUE: Multidetector CT imaging of the abdomen and pelvis was performed following the standard protocol without IV contrast. RADIATION DOSE REDUCTION: This exam was performed according to the departmental dose-optimization program which includes automated exposure control, adjustment of the mA and/or kV according to patient size and/or use of iterative reconstruction technique. COMPARISON:  CTA chest, abdomen and pelvis 10/02/2022, CT abdomen and pelvis without contrast 06/01/2021. FINDINGS: Lower chest: Cardiac size is normal. Lung bases are clear with mild chronic elevation of the right hemidiaphragm. Sternotomy sutures are again noted with partially visualized ascending aortic tube graft repair and a partially visualized descending aortic stent graft. Maximum descending aortic caliber 4.6 cm which seems unchanged. Hepatobiliary: Shrinking cyst noted in hepatic segment 1 measuring 1.5 cm, previously 3.1 cm.  Otherwise no focal liver abnormality is seen without contrast. No gallstones, gallbladder wall thickening, or biliary dilatation. Pancreas: Partially atrophic. No focal abnormality is seen without contrast. Spleen: Unremarkable. Adrenals/Urinary Tract: Stable 1.8 cm left adrenal genu nodule. No right adrenal abnormality. No contour deforming mass of the kidneys is seen. There is no urinary stone or obstruction. Bladder is unremarkable for the degree of distension. Stomach/Bowel: The stomach is mildly distended with backed up fluid. No wall thickening. High-grade small-bowel obstruction. There is diffuse dilatation up to 5 cm, obstruction point likely somewhere in the distal third of the ileum. The terminal ileal segments are collapsed and contained within a peristomal hernia in the anterolateral right mid abdomen where there is a loop ileostomy. There has been a prior right hemicolectomy with decompressed Hartmann's pouch beginning in the distal transverse colon. There is a second, larger hernia sac medial to the peristomal hernia with the hernia mouth 5 cm diameter. The transitional segment in this case is draped over the lateral wall of the mouth of this second hernia, best seen on 2:50 and adjacent images. This hernia sac contains dilated as well as decompressed segments and trace fluid, with the hernia mouth extending to the umbilicus. Above this level are several supraumbilical right and left of midline fat hernias but no other hernias contain bowel. No bowel pneumatosis, free air or portal venous gas are seen Vascular/Lymphatic: Patchy aortoiliac atherosclerosis. 5.2 cm infrarenal fusiform AAA is again noted and is unchanged. No adenopathy. Reproductive: Status post hysterectomy. No adnexal masses. Other: Mild scattered fluid in the mesenteric folds, minimal fluid in hernia sacs. Trace ascites in pelvis. No free air. There is subcutaneous stranding in right upper abdominal wall above the level of hernias,  which could be traumatic or due to cellulitis. Small inguinal fat hernias are again noted.  There is no free air. Musculoskeletal: Degenerative changes lumbar spine including advanced facet hypertrophy L4-5, degenerative disc space loss L5-S1 with spondylosis. No acute or other significant osseous findings. Ankylosis right SI joint. IMPRESSION: 1. High-grade small-bowel obstruction with transition point likely somewhere in the distal third of the ileum. 2. The terminal ileal segments are collapsed and contained within a peristomal hernia in the anterolateral right mid abdomen where there is a loop ileostomy. 3. There is a second, larger hernia sac medial to the peristomal hernia with the hernia mouth 5 cm diameter. The transitional segment in this case is draped over the lateral wall of the mouth of this second hernia. 4. No bowel pneumatosis, free air or portal venous gas are seen. 5. Mild scattered fluid in the  mesenteric folds and in both hernia sacs, trace ascites in the pelvis. 6. Subcutaneous stranding in the right upper abdominal wall above the level of hernias, which could be traumatic or due to cellulitis. 7. Aortic atherosclerosis with 5.2 cm infrarenal fusiform AAA, unchanged. Follow-up as indicated. 8. Prior right hemicolectomy with decompressed Hartmann's pouch beginning in the distal transverse colon. 9. Stable 1.8 cm left adrenal genu nodule. Aortic Atherosclerosis (ICD10-I70.0). Electronically Signed   By: Denman Fischer M.D.   On: 08/07/2023 00:19   Scheduled Meds: Continuous Infusions:  sodium chloride  150 mL/hr at 08/08/23 1613    LOS: 1 day   Colin Dawley M.D on 08/08/2023 at 5:56 PM  Go to [email or web address] - for contact info  [facility] - Office  [phone]  If 7PM-7AM, please contact night-coverage [email or web address] 08/08/2023, 5:56 PM

## 2023-08-09 DIAGNOSIS — K56609 Unspecified intestinal obstruction, unspecified as to partial versus complete obstruction: Secondary | ICD-10-CM | POA: Diagnosis not present

## 2023-08-09 LAB — CBC
HCT: 37.1 % (ref 36.0–46.0)
Hemoglobin: 12.3 g/dL (ref 12.0–15.0)
MCH: 32.8 pg (ref 26.0–34.0)
MCHC: 33.2 g/dL (ref 30.0–36.0)
MCV: 98.9 fL (ref 80.0–100.0)
Platelets: 310 10*3/uL (ref 150–400)
RBC: 3.75 MIL/uL — ABNORMAL LOW (ref 3.87–5.11)
RDW: 14.6 % (ref 11.5–15.5)
WBC: 10.1 10*3/uL (ref 4.0–10.5)
nRBC: 0.9 % — ABNORMAL HIGH (ref 0.0–0.2)

## 2023-08-09 LAB — BASIC METABOLIC PANEL WITH GFR
Anion gap: 26 — ABNORMAL HIGH (ref 5–15)
BUN: 106 mg/dL — ABNORMAL HIGH (ref 8–23)
CO2: 31 mmol/L (ref 22–32)
Calcium: 6.7 mg/dL — ABNORMAL LOW (ref 8.9–10.3)
Chloride: 83 mmol/L — ABNORMAL LOW (ref 98–111)
Creatinine, Ser: 8.07 mg/dL — ABNORMAL HIGH (ref 0.44–1.00)
GFR, Estimated: 5 mL/min — ABNORMAL LOW (ref >60)
Glucose, Bld: 135 mg/dL — ABNORMAL HIGH (ref 70–99)
Potassium: 3.3 mmol/L — ABNORMAL LOW (ref 3.5–5.1)
Sodium: 140 mmol/L (ref 135–145)

## 2023-08-09 MED ORDER — SODIUM CHLORIDE 0.9 % IV SOLN: INTRAVENOUS | Status: DC

## 2023-08-09 MED ORDER — POTASSIUM CHLORIDE 10 MEQ/100ML IV SOLN
10.0000 meq | INTRAVENOUS | Status: AC
Start: 2023-08-09 — End: 2023-08-09
  Administered 2023-08-09 (×4): 10 meq via INTRAVENOUS
  Filled 2023-08-09 (×4): qty 100

## 2023-08-09 MED ORDER — SODIUM CHLORIDE 0.9 % IV BOLUS
1000.0000 mL | Freq: Once | INTRAVENOUS | Status: AC
  Administered 2023-08-09: 1000 mL via INTRAVENOUS

## 2023-08-09 NOTE — Plan of Care (Signed)

## 2023-08-09 NOTE — Progress Notes (Signed)
 PROGRESS NOTE  Dorothy Harris, is a 63 y.o. 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 - General Surgery consult from Dr. Collene Dawson appreciated --- the hernia appears to be reducible per general surgeon Continue NGT decompression - Continue IV fluids while NPO - If patient fails conservative approach--general surgeon believes that ideally patient should have a colonoscopy prior to surgical intervention with lysis of adhesions and possible reversal of ileostomy - 2) AKI----acute kidney injury on CKD stage IV -due to dehydration/volume depletion in a patient with SBO and n.p.o. status with high output from NG tube -  creatinine on admission= 4.59,  baseline creatinine = 2.0 to 2.2  ,  -creatinine is now= 8.07 ([place]) - Nephrology consult appreciated - No metabolic acidosis and no hyperkalemia at this time, elevated anion gap noted Discussed with Dr Austine Blunt (Nephrology) on 08/09/23--- patient is volume depleted, recommends aggressive IV hydration while n.p.o. with high output from NG tube -Renally adjust medications, avoid nephrotoxic agents / dehydration  / hypotension  Intake/Output Summary (Last 24  hours) at 08/09/2023 1019 Last data filed at 08/09/2023 1000 Gross per 24 hour  Intake 2614.82 ml  Output 5125 ml  Net -2510.18 ml    3)Elevated Troponin: No chest pain, EKG nonacute suspect demand ischemia due to AKI and  bowel obstruction.  Troponin flat 93 > 86   4)Essential hypertension: BP stable.  - c/n to Hold PTA amlodipine  and oral hydralazine   -May use IV hydralazine  as needed elevated BP   5)Paroxysmal A-fib: -PTA amiodarone  and Coreg  on hold due to n.p.o. status -May use IV metoprolol  as needed elevated heart rate -Patient was not on anticoagulation PTA   6)HFpEF w/ tte 8/24-EF 65 to 70%, G1DD: Currently volume depleted needing IV fluids due to SBO.   - Be judicious with IV fluids  7)HLD-hold PTA atorvastatin  while n.p.o.  8)Hypokalemia--- due to GI losses, replace and recheck   Status is: Inpatient   Disposition: The patient is from: Home              Anticipated d/c is to: Home              Anticipated d/c date is: > 3 days              Patient currently is not medically stable to d/c. Barriers: Not Clinically Stable-   Code Status :  -  Code Status: Full Code   Family Communication:   (patient is alert, awake and coherent)  Discussed with pt's sister Dorothy Harris at bedside  DVT Prophylaxis  :   - SCDs   SCDs Start: 08/07/23 0437   Lab Results  Component Value Date   PLT 310 08/09/2023  Inpatient Medications  Scheduled Meds: Continuous Infusions:  sodium chloride      potassium chloride      sodium chloride      PRN Meds:.acetaminophen  **OR** acetaminophen , hydrALAZINE , HYDROmorphone  (DILAUDID ) injection, metoprolol  tartrate, ondansetron  **OR** ondansetron  (ZOFRAN ) IV, phenol   Anti-infectives (From admission, onward)    None       Subjective: [name] today has no fevers, no emesis,  No chest pain,   - Patient requesting food---threatening to leave AGAINST MEDICAL ADVICE if we do not give her food -RN Brandie at bedside, patient Sister  Dorothy Harris is on FaceTime---plan of care discussed rationale for n.p.o. status discussed, questions answered -Patient is agreeable to stay in the hospital for now - Gastric output from NG tube noted   Objective: Vitals:   08/08/23 1429 08/08/23 1638 08/08/23 1959 08/09/23 0529  BP: 110/83 111/74 102/73 116/73  Pulse: (!) 101 (!) 101 (!) 106 99  Resp: 20 20 20  (!) 22  Temp: 98.2 F (36.8 C) 98.8 F (37.1 C) 98.2 F (36.8 C) 97.6 F (36.4 C)  TempSrc: Oral Oral Oral Oral  SpO2: 97% 96% 95% 93%  Weight:    85.6 kg  Height:        Intake/Output Summary (Last 24 hours) at 08/09/2023 1013 Last data filed at 08/09/2023 0558 Gross per 24 hour  Intake 2614.82 ml  Output 4425 ml  Net -1810.18 ml   Filed Weights   08/06/23 2130 08/08/23 0407 08/09/23 0529  Weight: 89 kg 86.3 kg 85.6 kg    Physical Exam  Gen:- Awake Alert,  in no apparent distress  HEENT:- [place].AT, No sclera icterus Nose- NG tube gastric drainage Neck-Supple Neck,No JVD,.  Lungs-  CTAB , fair symmetrical air movement CV- S1, S2 normal, regular  Abd-diminished B.Sounds, Abd Soft, No significant tenderness, ileostomy bag without significant output, ventral hernia and parastomal hernia noted Extremity/Skin:- No  edema, pedal pulses present  Psych-affect is appropriate, oriented x3 Neuro-generalized weakness, no new focal deficits, no tremors  Data Reviewed: I have personally reviewed following labs and imaging studies  CBC: Recent Labs  Lab 08/06/23 2148 08/07/23 0501 08/08/23 0515 08/09/23 0557  WBC 8.0 8.6 10.0 10.1  NEUTROABS 5.0  --   --   --   HGB 13.0 11.8* 12.3 12.3  HCT 40.3 35.7* 38.8 37.1  MCV 98.1 97.8 98.0 98.9  PLT 290 249 319 310   Basic Metabolic Panel: Recent Labs  Lab 08/06/23 2148 08/07/23 0501 08/08/23 0515 08/09/23 0557  NA 134* 135 141 140  K 4.0 3.8 3.7 3.3*  CL 96* 96* 88* 83*  CO2 19* 24 31 31   GLUCOSE 189* 139* 158* 135*  BUN 57* 58* 85* 106*  CREATININE 4.59* 4.49* 6.40*  8.07*  CALCIUM  9.2 8.7* 8.3* 6.7*  MG  --  2.0  --   --   PHOS  --  5.4*  --   --    GFR: Estimated Creatinine Clearance: 8.1 mL/min (A) (by C-G formula based on SCr of 8.07 mg/dL (H)). Liver Function Tests: Recent Labs  Lab 08/06/23 2148 08/07/23 0501  AST 23 17  ALT 20 17  ALKPHOS 100 86  BILITOT 0.8 1.0  PROT 8.1 7.1  ALBUMIN  4.2 3.8   Radiology Studies: No results found.  Scheduled Meds: Continuous Infusions:  sodium chloride      potassium chloride      sodium chloride       LOS: 2 days   Colin Dawley M.D on 08/09/2023 at 10:13 AM  Go  to [email or web address] - for contact info  [facility] - Office  [phone]  If 7PM-7AM, please contact night-coverage [email or web address] 08/09/2023, 10:13 AM

## 2023-08-09 NOTE — Progress Notes (Signed)
 Dr. Josiah Nigh came to bedside and talked at length with family & patient in regarding patient's condition, prognosis, and treatment plan. Patient & family expressed understanding.

## 2023-08-09 NOTE — Progress Notes (Addendum)
 Rounded on patient's room. Patient iv fluid had been disconnected and line was in trash can. Threw away all of the tubing & bags. Restarted per order.

## 2023-08-09 NOTE — Progress Notes (Signed)
 New care orders by Dr. Collene Dawson confirmed to patient and started. Patient did sit up for about 20 mins on the side of the bed before requesting to be placed back into bed. Medical team is aware of patient's lack of urine output and IV fluids currently infusing. Patient continues to have drainage to NG canister. Pain meds given earlier have benefited patient.

## 2023-08-09 NOTE — Progress Notes (Signed)
 Patient concerns over not being able to eat and wanting to be discharge relayed to physician team at the start of the shift; Patient NG tube secured to nose; 500cc in container.

## 2023-08-09 NOTE — Progress Notes (Signed)
 [facility] Progress Note     Subjective: Patient wanting to leave AMA this AM. Says ok with staying now. Discussed with her and sister the issues. Her complicated abdomen, the acute on chronic renal insufficiency, danger of surgery. Improvement in abdomen but continued obstruction.    Patient never got colonoscopy. She did get barium enema 09/2021 when they went to discuss reversal with Dr. Aniceto Barley.   Objective: Vital signs in last 24 hours: Temp:  [97.6 F (36.4 C)-98.8 F (37.1 C)] 97.6 F (36.4 C) (05/04 0529) Pulse Rate:  [70-106] 70 (05/04 1211) Resp:  [20-22] 22 (05/04 0529) BP: (98-116)/(65-83) 98/65 (05/04 1211) SpO2:  [93 %-100 %] 100 % (05/04 1211) Weight:  [85.6 kg] 85.6 kg (05/04 0529) Last BM Date : 08/03/23 (per patient report)  Intake/Output from previous day: 05/03 0701 - 05/04 0700 In: 2614.8 [P.O.:300; I.V.:2314.8] Out: 4425 [Emesis/NG output:4425] Intake/Output this shift: Total I/O In: -  Out: 700 [Emesis/NG output:700]  General appearance: alert and no distress GI: soft, mildly distended, ventral hernia and parastomal hernia, soft, and reducible, ostomy and pink and no new gas or succus in bag  Lab Results:  Recent Labs    08/08/23 0515 08/09/23 0557  WBC 10.0 10.1  HGB 12.3 12.3  HCT 38.8 37.1  PLT 319 310   BMET Recent Labs    08/08/23 0515 08/09/23 0557  NA 141 140  K 3.7 3.3*  CL 88* 83*  CO2 31 31  GLUCOSE 158* 135*  BUN 85* 106*  CREATININE 6.40* 8.07*  CALCIUM  8.3* 6.7*   PT/INR No results for input(s): "LABPROT", "INR" in the last 72 hours.  Studies/Results: No results found.  Anti-infectives: Anti-infectives (From admission, onward)    None       Assessment/Plan: Patient with SBO associated with ventral hernia, also has end ileostomy and parastomal hernia. AKI worsening. IVF being given and GI losses replaced. Will monitor.   With this AKI, trying to avoid surgery as any anesthesia will likely  cause further injury with hypotension.  I think she would be best served to have a reversal as outpatient if at all possible if this SBO can resolve, but agree she needs a colonoscopy and things checked prior to a reversal occurring.   At this time, there is nothing rushing into surgery as the exam and WBC is all reassuring.   Spent time in the room discussing plan with patient and sister.  She can have ginger ale 1 can per shift for comfort and ice as long as NG suction in place.  Encouraged OOB and ambulation.    LOS: 2 days    Dorothy Harris 08/09/2023

## 2023-08-09 NOTE — Progress Notes (Signed)
 Bladder scan of patient revealed 90ml.

## 2023-08-10 ENCOUNTER — Inpatient Hospital Stay (HOSPITAL_COMMUNITY)

## 2023-08-10 DIAGNOSIS — R9431 Abnormal electrocardiogram [ECG] [EKG]: Secondary | ICD-10-CM

## 2023-08-10 DIAGNOSIS — E876 Hypokalemia: Secondary | ICD-10-CM | POA: Diagnosis not present

## 2023-08-10 DIAGNOSIS — N179 Acute kidney failure, unspecified: Secondary | ICD-10-CM | POA: Diagnosis not present

## 2023-08-10 DIAGNOSIS — R0789 Other chest pain: Secondary | ICD-10-CM

## 2023-08-10 DIAGNOSIS — K56609 Unspecified intestinal obstruction, unspecified as to partial versus complete obstruction: Secondary | ICD-10-CM | POA: Diagnosis not present

## 2023-08-10 LAB — RENAL FUNCTION PANEL
Albumin: 3.2 g/dL — ABNORMAL LOW (ref 3.5–5.0)
Anion gap: 21 — ABNORMAL HIGH (ref 5–15)
BUN: 96 mg/dL — ABNORMAL HIGH (ref 8–23)
CO2: 34 mmol/L — ABNORMAL HIGH (ref 22–32)
Calcium: 6.9 mg/dL — ABNORMAL LOW (ref 8.9–10.3)
Chloride: 92 mmol/L — ABNORMAL LOW (ref 98–111)
Creatinine, Ser: 5.92 mg/dL — ABNORMAL HIGH (ref 0.44–1.00)
GFR, Estimated: 8 mL/min — ABNORMAL LOW (ref >60)
Glucose, Bld: 122 mg/dL — ABNORMAL HIGH (ref 70–99)
Phosphorus: 5.7 mg/dL — ABNORMAL HIGH (ref 2.5–4.6)
Potassium: 3 mmol/L — ABNORMAL LOW (ref 3.5–5.1)
Sodium: 147 mmol/L — ABNORMAL HIGH (ref 135–145)

## 2023-08-10 LAB — CBC
HCT: 36.1 % (ref 36.0–46.0)
Hemoglobin: 11.3 g/dL — ABNORMAL LOW (ref 12.0–15.0)
MCH: 32.1 pg (ref 26.0–34.0)
MCHC: 31.3 g/dL (ref 30.0–36.0)
MCV: 102.6 fL — ABNORMAL HIGH (ref 80.0–100.0)
Platelets: 305 10*3/uL (ref 150–400)
RBC: 3.52 MIL/uL — ABNORMAL LOW (ref 3.87–5.11)
RDW: 14.9 % (ref 11.5–15.5)
WBC: 13.3 10*3/uL — ABNORMAL HIGH (ref 4.0–10.5)
nRBC: 1.7 % — ABNORMAL HIGH (ref 0.0–0.2)

## 2023-08-10 LAB — TROPONIN I (HIGH SENSITIVITY): Troponin I (High Sensitivity): 206 ng/L (ref <18)

## 2023-08-10 LAB — GLUCOSE, CAPILLARY: Glucose-Capillary: 118 mg/dL — ABNORMAL HIGH (ref 70–99)

## 2023-08-10 LAB — MAGNESIUM: Magnesium: 2.3 mg/dL (ref 1.7–2.4)

## 2023-08-10 MED ORDER — LACTATED RINGERS IV BOLUS
1000.0000 mL | Freq: Once | INTRAVENOUS | Status: AC
  Administered 2023-08-10: 1000 mL via INTRAVENOUS

## 2023-08-10 MED ORDER — HEPARIN SODIUM (PORCINE) 5000 UNIT/ML IJ SOLN
5000.0000 [IU] | Freq: Three times a day (TID) | INTRAMUSCULAR | Status: DC
  Administered 2023-08-10 – 2023-08-26 (×47): 5000 [IU] via SUBCUTANEOUS
  Filled 2023-08-10 (×47): qty 1

## 2023-08-10 MED ORDER — POTASSIUM CHLORIDE 10 MEQ/100ML IV SOLN
10.0000 meq | INTRAVENOUS | Status: AC
Start: 2023-08-10 — End: 2023-08-10
  Administered 2023-08-10 (×4): 10 meq via INTRAVENOUS
  Filled 2023-08-10: qty 100

## 2023-08-10 MED ORDER — POTASSIUM CHLORIDE 10 MEQ/100ML IV SOLN
10.0000 meq | INTRAVENOUS | Status: AC
  Administered 2023-08-10 (×4): 10 meq via INTRAVENOUS
  Filled 2023-08-10 (×3): qty 100

## 2023-08-10 MED ORDER — PANTOPRAZOLE SODIUM 40 MG IV SOLR
40.0000 mg | Freq: Two times a day (BID) | INTRAVENOUS | Status: DC
  Administered 2023-08-10 – 2023-08-29 (×38): 40 mg via INTRAVENOUS
  Filled 2023-08-10 (×39): qty 10

## 2023-08-10 MED ORDER — PROCHLORPERAZINE EDISYLATE 10 MG/2ML IJ SOLN
10.0000 mg | Freq: Four times a day (QID) | INTRAMUSCULAR | Status: DC | PRN
  Administered 2023-08-14 – 2023-08-19 (×6): 10 mg via INTRAVENOUS
  Filled 2023-08-10 (×6): qty 2

## 2023-08-10 MED ORDER — LACTATED RINGERS IV SOLN: INTRAVENOUS | Status: DC

## 2023-08-10 MED ORDER — TECHNETIUM TO 99M ALBUMIN AGGREGATED
4.2000 | Freq: Once | INTRAVENOUS | Status: AC | PRN
  Administered 2023-08-10: 4.2 via INTRAVENOUS

## 2023-08-10 MED ORDER — SODIUM CHLORIDE 0.45 % IV SOLN: INTRAVENOUS | Status: DC

## 2023-08-10 NOTE — Progress Notes (Addendum)
 Bladder scan shows , MD made aware and recommends repeat bladder scan in 3-4 hours. Will verbalize this to oncoming shift. Patient encouraged to urinate and get OOB to [facility] as she did earlier in the day.

## 2023-08-10 NOTE — Progress Notes (Signed)
 PROGRESS NOTE  Dorothy Harris, is a 63 y.o. 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 - General Surgery consult from Dr. Collene Dawson appreciated --- the hernia appears to be reducible per general surgeon Continue NGT decompression - Continue IV fluids while NPO - If patient fails conservative approach--general surgeon believes that ideally patient should have a colonoscopy prior to surgical intervention with lysis of adhesions and possible reversal of ileostomy - 2) AKI----acute kidney injury on CKD stage IV -due to dehydration/volume depletion in a patient with SBO and n.p.o. status with high output from NG tube -  creatinine on admission= 4.59,  baseline creatinine = 2.0 to 2.2  ,  -creatinine peaked at 8.07 (on 08/09/23) -Continue trending down---currently 5.92 with ongoing hydration - Nephrology consult appreciated - No metabolic acidosis and no hyperkalemia at this time, elevated anion gap noted - patient is volume depleted, c/n aggressive IV hydration while n.p.o. with high output from NG tube -Renally adjust medications, avoid nephrotoxic agents / dehydration  / hypotension  Intake/Output  Summary (Last 24 hours) at 08/10/2023 1808 Last data filed at 08/10/2023 1711 Gross per 24 hour  Intake 1896.8 ml  Output 6675 ml  Net -4778.2 ml    3) atypical chest pains with  elevated Troponin: -EKG nonacute suspect demand ischemia due to AKI and  bowel obstruction.  Troponin 214 >>206 - Lower extremity Dopplers and VQ scan negative for DVT and PE respectively   4)Essential hypertension: BP stable.  - c/n to Hold PTA amlodipine  and oral hydralazine   -May use IV hydralazine  as needed elevated BP   5)Paroxysmal A-fib: -PTA amiodarone  and Coreg  on hold due to n.p.o. status -May use IV metoprolol  as needed elevated heart rate -Patient was not on anticoagulation PTA   6)HFpEF w/ tte 8/24-EF 65 to 70%, G1DD: Currently volume depleted needing IV fluids due to SBO.   - Be judicious with IV fluids  7)HLD-hold PTA atorvastatin  while n.p.o.  8)Hypokalemia--- due to GI losses, replace and recheck  9) hypernatremia--- due to dehydration, continue fluids as ordered   Status is: Inpatient   Disposition: The patient is from: Home              Anticipated d/c is to: Home              Anticipated d/c date is: > 3 days              Patient currently is not medically stable to d/c. Barriers: Not Clinically Stable-   Code Status :  -  Code Status: Full Code   Family Communication:   (patient is alert, awake and coherent)  Discussed with pt's sister [name] at bedside  DVT Prophylaxis  :   -  SCDs   heparin  injection 5,000 Units Start: 08/10/23 1400 SCDs Start: 08/07/23 0437   Lab Results  Component Value Date   PLT 305 08/10/2023   Inpatient Medications  Scheduled Meds:  heparin  injection (subcutaneous)  5,000 Units Subcutaneous Q8H   pantoprazole  (PROTONIX ) IV  40 mg Intravenous Q12H   Continuous Infusions:  lactated ringers  125 mL/hr at 08/10/23 1130   potassium chloride  10 mEq (08/10/23 1733)   PRN Meds:.acetaminophen  **OR** acetaminophen , hydrALAZINE , HYDROmorphone   (DILAUDID ) injection, metoprolol  tartrate, phenol, prochlorperazine    Anti-infectives (From admission, onward)    None       Subjective: Dorothy Harris today has no fevers, no emesis,   - - Complains of intermittent chest discomfort and dyspnea - NG tube with good output - No stool in ostomy bag  Objective: Vitals:   08/10/23 0119 08/10/23 0431 08/10/23 0450 08/10/23 1539  BP: (!) 95/50 (!) 140/72 (!) 148/73 115/63  Pulse: 85 91 90 84  Resp:  19  18  Temp:  97.8 F (36.6 C) 97.8 F (36.6 C) 99.8 F (37.7 C)  TempSrc:   Oral Oral  SpO2: 92% 96% 95% 98%  Weight:  87.1 kg    Height:        Intake/Output Summary (Last 24 hours) at 08/10/2023 1808 Last data filed at 08/10/2023 1711 Gross per 24 hour  Intake 1896.8 ml  Output 6675 ml  Net -4778.2 ml   Filed Weights   08/08/23 0407 08/09/23 0529 08/10/23 0431  Weight: 86.3 kg 85.6 kg 87.1 kg    Physical Exam  Gen:- Awake Alert,  in no apparent distress  HEENT:- [place].AT, No sclera icterus Nose- NG tube gastric drainage Neck-Supple Neck,No JVD,.  Lungs-  CTAB , fair symmetrical air movement CV- S1, S2 normal, regular  Abd-diminished B.Sounds, Abd Soft, No significant tenderness, ileostomy bag without significant output, ventral hernia and parastomal hernia noted Extremity/Skin:- No  edema, pedal pulses present  Psych-affect is appropriate, oriented x3 Neuro-generalized weakness, no new focal deficits, no tremors  Data Reviewed: I have personally reviewed following labs and imaging studies  CBC: Recent Labs  Lab 08/06/23 2148 08/07/23 0501 08/08/23 0515 08/09/23 0557 08/10/23 0513  WBC 8.0 8.6 10.0 10.1 13.3*  NEUTROABS 5.0  --   --   --   --   HGB 13.0 11.8* 12.3 12.3 11.3*  HCT 40.3 35.7* 38.8 37.1 36.1  MCV 98.1 97.8 98.0 98.9 102.6*  PLT 290 249 319 310 305   Basic Metabolic Panel: Recent Labs  Lab 08/06/23 2148 08/07/23 0501 08/08/23 0515 08/09/23 0557 08/10/23 0503  NA 134* 135 141 140 147*  K 4.0  3.8 3.7 3.3* 3.0*  CL 96* 96* 88* 83* 92*  CO2 19* 24 31 31  34*  GLUCOSE 189* 139* 158* 135* 122*  BUN 57* 58* 85* 106* 96*  CREATININE 4.59* 4.49* 6.40* 8.07* 5.92*  CALCIUM  9.2 8.7* 8.3* 6.7* 6.9*  MG  --  2.0  --   --  2.3  PHOS  --  5.4*  --   --  5.7*   GFR: Estimated Creatinine Clearance: 11.2 mL/min (A) (by C-G formula based on SCr of 5.92 mg/dL (H)). Liver Function Tests: Recent Labs  Lab 08/06/23 2148 08/07/23 0501 08/10/23 0503  AST 23 17  --   ALT 20 17  --   ALKPHOS 100 86  --   BILITOT 0.8 1.0  --   PROT 8.1 7.1  --   ALBUMIN  4.2 3.8 3.2*   Radiology  Studies: NM Pulmonary Perfusion Result Date: 08/10/2023 CLINICAL DATA:  Chest pain with shortness of breath for a few days. Concern for pulmonary embolism. EXAM: NUCLEAR MEDICINE PERFUSION LUNG SCAN TECHNIQUE: Perfusion images were obtained in multiple projections after intravenous injection of radiopharmaceutical. No ventilation images obtained. RADIOPHARMACEUTICALS:  4.2 mCi Tc-3m MAA IV COMPARISON:  Bilateral lower extremity venous Doppler ultrasound and chest radiographs same date. Chest CTA 10/02/2022 FINDINGS: No focal perfusion defects are demonstrated. The pulmonary perfusion appears within normal limits, without evidence of acute pulmonary embolism. IMPRESSION: No evidence of acute pulmonary embolism on perfusion scintigraphy by PISAPED criteria. Electronically Signed   By: Elmon Hagedorn M.D.   On: 08/10/2023 16:40   DG CHEST PORT 1 VIEW Result Date: 08/10/2023 CLINICAL DATA:  Chest pain. EXAM: PORTABLE CHEST 1 VIEW COMPARISON:  Chest radiograph dated 01/19/2022. FINDINGS: Enteric tube with tip in the region of the GE junction. Recommend further advancing by additional 8 cm. Shallow inspiration with bibasilar atelectasis. No focal consolidation, pleural effusion, pneumothorax. Stable cardiac silhouette. Status post repair of the thoracic aorta median sternotomy wires. No acute osseous pathology. IMPRESSION: 1. Enteric  tube with tip in the region of the GE junction. Recommend further advancing by additional 8 cm. 2. Shallow inspiration with bibasilar atelectasis. Electronically Signed   By: Angus Bark M.D.   On: 08/10/2023 14:23   US  Venous Img Lower Bilateral (DVT) Result Date: 08/10/2023 CLINICAL DATA:  Pulmonary emboli EXAM: BILATERAL LOWER EXTREMITY VENOUS DOPPLER ULTRASOUND TECHNIQUE: Gray-scale sonography with compression, as well as color and duplex ultrasound, were performed to evaluate the deep venous system(s) from the level of the common femoral vein through the popliteal and proximal calf veins. COMPARISON:  None Available. FINDINGS: VENOUS Normal compressibility of the common femoral, superficial femoral, and popliteal veins, as well as the visualized calf veins. Visualized portions of profunda femoral vein and great saphenous vein unremarkable. No filling defects to suggest DVT on grayscale or color Doppler imaging. Doppler waveforms show normal direction of venous flow, normal respiratory plasticity and response to augmentation. Limited views of the contralateral common femoral vein are unremarkable. OTHER None. Limitations: none IMPRESSION: Negative. Electronically Signed   By: Reagan Camera M.D.   On: 08/10/2023 12:51   DG Abd 2 Views Result Date: 08/10/2023 CLINICAL DATA:  Follow-up small bowel obstruction. EXAM: ABDOMEN - 2 VIEW COMPARISON:  08/07/23 FINDINGS: Enteric tube tip is below the level of the GE junction. Signs of previous median sternotomy. 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 Persistent dilated loops of small bowel are identified measuring up to 5.1 cm. Paucity of colonic/rectal gas. No supine evidence for pneumoperitoneum. IMPRESSION: Persistent dilated loops of small bowel compatible with small bowel obstruction. Electronically Signed   By: Kimberley Penman M.D.   On: 08/10/2023 06:50    Scheduled Meds:  heparin  injection (subcutaneous)  5,000 Units Subcutaneous Q8H   pantoprazole   (PROTONIX ) IV  40 mg Intravenous Q12H   Continuous Infusions:  lactated ringers  125 mL/hr at 08/10/23 1130   potassium chloride  10 mEq (08/10/23 1733)    LOS: 3 days   Colin Dawley M.D on 08/10/2023 at 6:08 PM  Go to [email or web address] - for contact info  [facility] - Office  [phone]  If 7PM-7AM, please contact night-coverage [email or web address] 08/10/2023, 6:08 PM

## 2023-08-10 NOTE — Progress Notes (Signed)
 Rapid response  Rapid response was called due to an abnormal EKG done due to patient complaining of pleuritic, reproducible chest pain.  EKG done was reported to be SVT in the rate of 170s, but she quickly converted back to NSR Repeated EKG done and reviewed showed no STEMI but showed normal sinus rhythm with PACs at a rate of 90 bpm with QTc of 538 ms At bedside, patient was in no acute distress, chest pain was nonradiating.  Troponin was ordered. EKG was repeated and reviewed and showed normal sinus rhythm at rate of 98 bpm with PACs and prolonged QTc of 556 ms. Zofran  was discontinued due to prolonged QT interval and Compazine  was started Morning lab was reviewed and showed hypokalemia and elevated troponin  Assessment and plan Atypical chest pain Elevated troponin Patient's troponin appears to be chronically elevated, though, not this elevated It may still be due to type II demand ischemia, this will be trended Continue telemetry  Troponin -214 Consider cardiology consult if troponin continues to increase to help decide if Stress test is needed Versus other  diagnostic modalities.     Prolonged QTc Hypokalemia QTc 556 ms, potassium 3.0 Potassium will be replenished Avoid QT prolonging drugs Magnesium  level will be checked Repeat EKG in the afternoon  Acute kidney injury Please refer to patient's progress note for details regarding this.  Critical time: 54 minutes   Critical care personally provided  managing the patient due to high probability of clinically significant and life threatening deterioration. This critical care time included obtaining a history; examining the patient, pulse oximetry; ordering and review of studies; arranging urgent treatment with development of a management plan; evaluation of patient's response of treatment; frequent reassessment; and discussions with other providers.  This critical care time was performed to assess and manage the high probability of  imminent and life threatening deterioration that could result in multi-organ failure.  Please refer to admission H&P and progress notes for details regarding the care of this patient

## 2023-08-10 NOTE — Plan of Care (Signed)

## 2023-08-10 NOTE — Progress Notes (Signed)
 Bladder scanned shows , MD made aware, encouraging ambulation and using the restroom.

## 2023-08-10 NOTE — Progress Notes (Signed)
 Had about 500 input with one can of gingerale and ice and flush.   NG tube output was .  Ileostomy output was 100.  Unable to void and in and out cath completed twice with total urine output of 1400 mls.  CCMD  called with 60 sec run of vtach in 170's.  Vitals stable except when patient removes nasal canula O2 drops in the 70's..  EKG performed and rapid response called due to first reading showing possible stemi which showed to be false reading per second EKG.  Labs ordered.

## 2023-08-10 NOTE — Progress Notes (Signed)
 Bladder scanned patient reading 409 mL in bladder. Patient ambulated with two assist to [facility], measured urine output was 450 mL. MD Elyse Hand made aware.

## 2023-08-10 NOTE — Progress Notes (Signed)
 Admit: 08/06/2023 LOS: 3  89F with AKI on CKD4 from SBO with sig GI losses / hypovolemia  Subjective:  1.4L UOP yesterday 5.8 L out of NG tube, net -3.7 L per I/Os Serum creatinine down to 5.9, K3.0, bicarbonate 34, sodium 147 Placed on half-normal saline earlier today Still no significant flatus or stool in ostomy bag, bowel sounds are quiet, fairly nontender on exam  05/04 0701 - 05/05 0700 In: 3461 [I.V.:736.6; NG/GT:487; IV Piggyback:2237.4] Out: 7250 [Urine:1400; Emesis/NG output:5750; Stool:100]  Filed Weights   08/08/23 0407 08/09/23 0529 08/10/23 0431  Weight: 86.3 kg 85.6 kg 87.1 kg    Scheduled Meds:  pantoprazole  (PROTONIX ) IV  40 mg Intravenous Q12H   Continuous Infusions:  lactated ringers      lactated ringers      potassium chloride  10 mEq (08/10/23 0836)   potassium chloride      PRN Meds:.acetaminophen  **OR** acetaminophen , hydrALAZINE , HYDROmorphone  (DILAUDID ) injection, metoprolol  tartrate, phenol, prochlorperazine   Current Labs: reviewed   Physical Exam:  Blood pressure (!) 148/73, pulse 90, temperature 97.8 F (36.6 C), temperature source Oral, resp. rate 19, height 5\' 7"  (1.702 m), weight 87.1 kg, SpO2 95%. Lying in bed, NG tube in place with bilious drainage Regular, normal S1 and S2 Clear bilaterally Mild distention, not very tender, quiescent bowel sounds, ostomy without significant gas/stool/liquid No peripheral edema  A Nonoliguric AKI on CKD4 likely due to hypovolemia/prerenal insults from #2.  Baseline creatinine is around 2.0 SBO with significant GI output from NG tube decompression with historical significant abdominal history including ostomy and hemicolectomy Hypertension: Blood pressures fairly stable, off all medications Developing very mild hyponatremia -does not need free water  as much as needs volume replacement Hypokalemia from GI losses Metabolic alkalosis from GI losses  P Improved creatinine is reassuring but we have a ways to go  still Would continue hydration with isotonic fluids, LR 1 L bolus followed by 125 mL/h Agree with potassium supplementation, continue aggressive approach Surgery following for management of SBO Medication Issues; Preferred narcotic agents for pain control are hydromorphone , fentanyl , and methadone. Morphine  should not be used.  Baclofen should be avoided Avoid oral sodium phosphate  and magnesium  citrate based laxatives / bowel preps    Fawn Hooks MD 08/10/2023, 9:35 AM  Recent Labs  Lab 08/07/23 0501 08/08/23 0515 08/09/23 0557 08/10/23 0503  NA 135 141 140 147*  K 3.8 3.7 3.3* 3.0*  CL 96* 88* 83* 92*  CO2 24 31 31  34*  GLUCOSE 139* 158* 135* 122*  BUN 58* 85* 106* 96*  CREATININE 4.49* 6.40* 8.07* 5.92*  CALCIUM  8.7* 8.3* 6.7* 6.9*  PHOS 5.4*  --   --  5.7*   Recent Labs  Lab 08/06/23 2148 08/07/23 0501 08/08/23 0515 08/09/23 0557 08/10/23 0513  WBC 8.0   < > 10.0 10.1 13.3*  NEUTROABS 5.0  --   --   --   --   HGB 13.0   < > 12.3 12.3 11.3*  HCT 40.3   < > 38.8 37.1 36.1  MCV 98.1   < > 98.0 98.9 102.6*  PLT 290   < > 319 310 305   < > = values in this interval not displayed.

## 2023-08-10 NOTE — Progress Notes (Signed)
 [facility] Progress Note     Subjective: Still no bowel function, Rn report some stuff emptied in the bag. Creatinine improved. Requiring in and out for urine.   Patient also with a run of SVT last night, getting US  of the BLE today to ensure no PE. At one point was concerned she had NSTEMI, but then repeat EKG did not think this.   Objective: Vital signs in last 24 hours: Temp:  [97.8 F (36.6 C)-99.1 F (37.3 C)] 97.8 F (36.6 C) (05/05 0450) Pulse Rate:  [70-101] 90 (05/05 0450) Resp:  [17-20] 19 (05/05 0431) BP: (95-148)/(50-73) 148/73 (05/05 0450) SpO2:  [91 %-100 %] 95 % (05/05 0450) Weight:  [87.1 kg] 87.1 kg (05/05 0431) Last BM Date : 08/03/23 (per patient report)  Intake/Output from previous day: 05/04 0701 - 05/05 0700 In: 3461 [I.V.:736.6; NG/GT:487; IV Piggyback:2237.4] Out: 7250 [Urine:1400; Emesis/NG output:5750; Stool:100] Intake/Output this shift: Total I/O In: 240 [P.O.:240] Out: -   General appearance: alert and no distress GI: soft, reducible hernia, bowel sounds noted, ostomy bag without any output noted, hernia appears distended with gas   Lab Results:  Recent Labs    08/09/23 0557 08/10/23 0513  WBC 10.1 13.3*  HGB 12.3 11.3*  HCT 37.1 36.1  PLT 310 305   BMET Recent Labs    08/09/23 0557 08/10/23 0503  NA 140 147*  K 3.3* 3.0*  CL 83* 92*  CO2 31 34*  GLUCOSE 135* 122*  BUN 106* 96*  CREATININE 8.07* 5.92*  CALCIUM  6.7* 6.9*   PT/INR No results for input(s): "LABPROT", "INR" in the last 72 hours.  Studies/Results: DG Abd 2 Views Result Date: 08/10/2023 CLINICAL DATA:  Follow-up small bowel obstruction. EXAM: ABDOMEN - 2 VIEW COMPARISON:  08/07/23 FINDINGS: Enteric tube tip is below the level of the GE junction. Signs of previous median sternotomy. 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 Persistent dilated loops of small bowel are identified measuring up to 5.1 cm. Paucity of colonic/rectal gas. No supine  evidence for pneumoperitoneum. IMPRESSION: Persistent dilated loops of small bowel compatible with small bowel obstruction. Electronically Signed   By: Kimberley Penman M.D.   On: 08/10/2023 06:50    Anti-infectives: Anti-infectives (From admission, onward)    None       Assessment/Plan: Patient with SBO associated with ventral hernia and end ileostomy with parastomal hernia. Cr is finally improving. IVF for dehydration and GI losses.   With everything she has going on, avoiding surgery still is in her best interest with AKI and cardiac events.  AKI improving but still not to baseline.  Cardiac issues, may need cardiology to weigh in especially if not improving given the risk of needing surgery if not resolving, patient may not be a candidate for surgery her.  Abdomen is soft and bowel sounds noted, hernias are reducible but nothing is progressing so may ultimately need surgery.   I would still argue that avoiding surgery if can resolve this SBO in her best interest as she ultimately just needs a reversal but no prior colonoscopy just the barium enema completed for her reversal workup   Going to get my partner's thoughts and get them to look through everything so they can weigh in.    LOS: 3 days    Dorothy Harris 08/10/2023

## 2023-08-11 DIAGNOSIS — K56609 Unspecified intestinal obstruction, unspecified as to partial versus complete obstruction: Secondary | ICD-10-CM | POA: Diagnosis not present

## 2023-08-11 DIAGNOSIS — K430 Incisional hernia with obstruction, without gangrene: Secondary | ICD-10-CM | POA: Diagnosis not present

## 2023-08-11 DIAGNOSIS — Z932 Ileostomy status: Secondary | ICD-10-CM | POA: Diagnosis not present

## 2023-08-11 LAB — BASIC METABOLIC PANEL WITH GFR
Anion gap: 13 (ref 5–15)
BUN: 75 mg/dL — ABNORMAL HIGH (ref 8–23)
CO2: 32 mmol/L (ref 22–32)
Calcium: 8.4 mg/dL — ABNORMAL LOW (ref 8.9–10.3)
Chloride: 99 mmol/L (ref 98–111)
Creatinine, Ser: 3.44 mg/dL — ABNORMAL HIGH (ref 0.44–1.00)
GFR, Estimated: 14 mL/min — ABNORMAL LOW (ref >60)
Glucose, Bld: 104 mg/dL — ABNORMAL HIGH (ref 70–99)
Potassium: 3.3 mmol/L — ABNORMAL LOW (ref 3.5–5.1)
Sodium: 144 mmol/L (ref 135–145)

## 2023-08-11 LAB — CBC
HCT: 31.5 % — ABNORMAL LOW (ref 36.0–46.0)
Hemoglobin: 9.7 g/dL — ABNORMAL LOW (ref 12.0–15.0)
MCH: 32.1 pg (ref 26.0–34.0)
MCHC: 30.8 g/dL (ref 30.0–36.0)
MCV: 104.3 fL — ABNORMAL HIGH (ref 80.0–100.0)
Platelets: 253 10*3/uL (ref 150–400)
RBC: 3.02 MIL/uL — ABNORMAL LOW (ref 3.87–5.11)
RDW: 15.1 % (ref 11.5–15.5)
WBC: 13.3 10*3/uL — ABNORMAL HIGH (ref 4.0–10.5)
nRBC: 0.9 % — ABNORMAL HIGH (ref 0.0–0.2)

## 2023-08-11 LAB — TROPONIN I (HIGH SENSITIVITY): Troponin I (High Sensitivity): 214 ng/L (ref <18)

## 2023-08-11 MED ORDER — SODIUM CHLORIDE 0.9 % IV SOLN: INTRAVENOUS | Status: AC | PRN

## 2023-08-11 MED ORDER — LACTATED RINGERS IV SOLN: INTRAVENOUS | Status: AC

## 2023-08-11 MED ORDER — POTASSIUM CHLORIDE 10 MEQ/100ML IV SOLN
10.0000 meq | INTRAVENOUS | Status: AC
  Administered 2023-08-11 (×4): 10 meq via INTRAVENOUS
  Filled 2023-08-11 (×4): qty 100

## 2023-08-11 NOTE — TOC Initial Note (Signed)
 Transition of Care [facility]) - Initial/Assessment Note    Patient Details  Name: Dorothy Harris MRN: [id number] Date of Birth: 10-17-1960  [facility]) CM/SW Contact:    Grandville Lax, LCSWA Phone Number: 08/11/2023, 8:28 AM  Clinical Narrative:                 Pt is high risk for readmission. CSW spoke with pt to complete assessment. Pt lives alone. Pt is able to complete ADL's independently. Pt drives to appointments when needed. Pt has had HH in the past but states no services currently. Pt has a cane and walker to use in the home. TOC to follow.   Expected Discharge Plan: Home/Self Care Barriers to Discharge: Continued Medical Work up   Patient Goals and CMS Choice Patient states their goals for this hospitalization and ongoing recovery are:: return home CMS Medicare.gov Compare Post Acute Care list provided to:: Patient Choice offered to / list presented to : Patient      Expected Discharge Plan and Services In-house Referral: Clinical Social Work Discharge Planning Services: CM Consult   Living arrangements for the past 2 months: Single Family Home                                      Prior Living Arrangements/Services Living arrangements for the past 2 months: Single Family Home Lives with:: Self Patient language and need for interpreter reviewed:: Yes Do you feel safe going back to the place where you live?: Yes      Need for Family Participation in Patient Care: Yes (Comment) Care giver support system in place?: Yes (comment) Current home services: DME Criminal Activity/Legal Involvement Pertinent to Current Situation/Hospitalization: No - Comment as needed  Activities of Daily Living   ADL Screening (condition at time of admission) Independently performs ADLs?: Yes (appropriate for developmental age) Is the patient deaf or have difficulty hearing?: No Does the patient have difficulty seeing, even when wearing glasses/contacts?: No Does the  patient have difficulty concentrating, remembering, or making decisions?: No  Permission Sought/Granted                  Emotional Assessment Appearance:: Appears stated age Attitude/Demeanor/Rapport: Engaged Affect (typically observed): Accepting Orientation: : Oriented to Self, Oriented to Place, Oriented to  Time, Oriented to Situation Alcohol / Substance Use: Not Applicable Psych Involvement: No (comment)  Admission diagnosis:  Small bowel obstruction (HCC) [K56.609] AKI (acute kidney injury) (HCC) [N17.9] Nausea and vomiting, unspecified vomiting type [R11.2] Patient Active Problem List   Diagnosis Date Noted   Small bowel obstruction (HCC) 08/07/2023   Abdominal pain 08/07/2023   Nausea & vomiting 08/07/2023   Dehydration 08/07/2023   Elevated troponin 08/07/2023   Obesity, Class I, BMI 30-34.9 08/07/2023   (HFpEF) heart failure with preserved ejection fraction (HCC) 08/07/2023   Ischemic colitis (HCC) 08/07/2023   Ileostomy in place (HCC) 08/07/2023   Parastomal hernia without obstruction or gangrene 08/07/2023   Incisional hernia with obstruction but no gangrene 08/07/2023   Dyspnea on exertion 11/19/2022   Vitamin D  deficiency 05/29/2022   Abdominal aortic aneurysm (AAA) (HCC) 05/08/2022   Iron deficiency 05/08/2022   Acute lumbar back pain 05/08/2022   Anemia of chronic renal failure 03/20/2022   Hospital discharge follow-up 02/20/2022   Anemia 01/29/2022   History of pneumonia 01/29/2022   Hyperglycemia 01/29/2022   Impaired mobility and ADLs 01/29/2022  Incomplete paraplegia (HCC) 01/29/2022   Wound of left groin 01/29/2022   Stage 4 chronic kidney disease (HCC)    Leukocytosis    Leg weakness, bilateral 01/17/2022   Thoracic aortic aneurysm (HCC) 01/15/2022   Annual physical exam 11/05/2021   Depression, major, single episode, complete remission (HCC) 06/21/2021   S/P exploratory laparotomy 06/21/2021   Mass of right breast 06/21/2021   Disability  examination 06/21/2021   Pressure injury of skin 06/02/2021   Hyperkalemia 06/01/2021   Acute hyperkalemia 06/01/2021   PAF (paroxysmal atrial fibrillation) (HCC) 05/07/2021   Essential hypertension 05/07/2021   Pure hypercholesterolemia 05/07/2021   Debility 04/15/2021   Malnutrition of moderate degree 04/03/2021   Encounter for postanesthesia care 04/02/2021   Peritonitis (HCC) 04/02/2021   Endotracheally intubated 04/02/2021   Acute kidney injury superimposed on stage 4 chronic kidney disease (HCC) 04/02/2021   S/P ascending aortic replacement 03/25/2021   Encounter for weaning from ventilator [facility])    Status post surgery 03/24/2021   Tobacco abuse 07/30/2017   History of drug use 07/30/2017   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 [place] [address] Phone: [phone] Fax: [phone]     Social Drivers of Health (SDOH) Social History: SDOH Screenings   Food Insecurity: Food Insecurity Present (08/07/2023)  Housing: High Risk (08/07/2023)  Transportation Needs: No Transportation Needs (08/07/2023)  Utilities: Not At Risk (08/07/2023)  Alcohol Screen: Low Risk  (05/07/2021)  Depression (PHQ2-9): Medium Risk (05/05/2023)  Financial Resource Strain: Low Risk  (05/07/2021)  Physical Activity: Inactive (05/07/2021)  Tobacco Use: High Risk (08/06/2023)   SDOH Interventions:     Readmission Risk Interventions    08/11/2023    8:27 AM 08/07/2023    7:29 AM 01/22/2022   12:57 PM  Readmission Risk Prevention Plan  Medication Screening   Complete   Transportation Screening Complete Complete Complete  PCP or Specialist Appt within 3-5 Days   Complete  HRI or Home Care Consult Complete  Complete  Social Work Consult for Recovery Care Planning/Counseling Complete  Complete  Palliative Care Screening Not Applicable  Not Applicable  Medication Review Oceanographer) Complete  Referral to Pharmacy

## 2023-08-11 NOTE — Plan of Care (Signed)

## 2023-08-11 NOTE — Progress Notes (Addendum)
 PROGRESS NOTE  Dorothy Harris, is a 63 y.o. 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 - General Surgery consult from Dr. Collene Harris appreciated --- the hernia appears to be reducible per general surgeon Continue NGT decompression - Continue IV fluids while NPO - If patient fails conservative approach--general surgeon believes that ideally patient should have a colonoscopy prior to surgical intervention with lysis of adhesions and possible reversal of ileostomy -Per general surgery team there is a possibility of surgical intervention later this week if bowel issues felt to improve with conservative measures - 2) AKI----acute kidney injury on CKD stage IV -due to dehydration/volume depletion in a patient with SBO and n.p.o. status with high output from NG tube -  creatinine on admission= 4.59,  baseline creatinine = 2.0 to 2.2  ,  -creatinine peaked at 8.07 (on 08/09/23) -Continue trending down---currently 3.44 with ongoing hydration - Nephrology consult appreciated - No metabolic acidosis and no hyperkalemia at this time, elevated anion gap noted - patient is volume depleted, -- c/n aggressive IV  hydration while n.p.o. with high output from NG tube -Renally adjust medications, avoid nephrotoxic agents / dehydration  / hypotension  Intake/Output Summary (Last 24 hours) at 08/11/2023 1512 Last data filed at 08/11/2023 1135 Gross per 24 hour  Intake 3078.03 ml  Output 2025 ml  Net 1053.03 ml    3) atypical chest pains with  elevated Troponin: -EKG nonacute suspect demand ischemia due to AKI and  bowel obstruction.  Troponin 214 >>206-- echo requested - Lower extremity Dopplers and VQ scan negative for DVT and PE respectively 08/11/23 -Cardiology consult requested for tachyarrhythmias and preop clearance -Concerns about tachyarrhythmias overnight, currently chest pain-free   4)Essential hypertension: BP stable.  - c/n to Hold PTA amlodipine  and oral hydralazine   -May use IV hydralazine  as needed elevated BP   5)Paroxysmal A-fib: -PTA amiodarone  and Coreg  on hold due to n.p.o. status -May use IV metoprolol  as needed elevated heart rate -Patient was not on anticoagulation PTA - echo pending   6)HFpEF w/ tte 8/24-EF 65 to 70%, G1DD: Currently volume depleted needing IV fluids due to SBO.   - Be judicious with IV fluids - Echo pending  7)HLD-hold PTA atorvastatin  while n.p.o.  8)Hypokalemia--- due to GI losses, replace and recheck -Magnesium  WNL - Keep potassium around 4 especially in view of arrhythmias  9)Hypernatremia--- due to dehydration,  - Creatinine is down to 144 from 147 continue fluids as ordered   Status is: Inpatient   Subcu heparin  for DVT prophylaxis - Protonix  IV for GI prophylaxis  Disposition: The patient is from: Home              Anticipated d/c is to: Home  Anticipated d/c date is: > 3 days              Patient currently is not medically stable to d/c. Barriers: Not Clinically Stable-   Code Status :  -  Code Status: Full Code   Family Communication:   (patient is alert, awake and coherent)  Discussed with pt's sister Dorothy Harris at  bedside  DVT Prophylaxis  :   - SCDs   heparin  injection 5,000 Units Start: 08/10/23 1400 SCDs Start: 08/07/23 0437   Lab Results  Component Value Date   PLT 253 08/11/2023   Inpatient Medications  Scheduled Meds:  heparin  injection (subcutaneous)  5,000 Units Subcutaneous Q8H   pantoprazole  (PROTONIX ) IV  40 mg Intravenous Q12H   Continuous Infusions:  sodium chloride  10 mL/hr at 08/11/23 1033   lactated ringers  125 mL/hr at 08/11/23 1406   PRN Meds:.sodium chloride , acetaminophen  **OR** acetaminophen , hydrALAZINE , HYDROmorphone  (DILAUDID ) injection, metoprolol  tartrate, phenol, prochlorperazine    Anti-infectives (From admission, onward)    None       Subjective: Dorothy Harris today has no fevers, no emesis,   - ??  A-fib/arrhythmias overnight -- No further chest discomfort - Having ostomy output and improving urine output  Objective: Vitals:   08/11/23 0623 08/11/23 0753 08/11/23 0917 08/11/23 1333  BP: (!) 94/54 121/61 102/69 113/70  Pulse: (!) 133 (!) 135 76 77  Resp: 18 (!) 28 (!) 22 (!) 23  Temp: 98.1 F (36.7 C) 99.1 F (37.3 C) 99 F (37.2 C) 100.1 F (37.8 C)  TempSrc: Oral Oral Oral Oral  SpO2: 95% 94% 96% 96%  Weight:      Height:        Intake/Output Summary (Last 24 hours) at 08/11/2023 1512 Last data filed at 08/11/2023 1135 Gross per 24 hour  Intake 3078.03 ml  Output 2025 ml  Net 1053.03 ml   Filed Weights   08/09/23 0529 08/10/23 0431 08/11/23 0500  Weight: 85.6 kg 87.1 kg 93.8 kg    Physical Exam  Gen:- Awake Alert,  in no apparent distress  HEENT:- [place].AT, No sclera icterus Nose- NG tube gastric drainage Neck-Supple Neck,No JVD,.  Lungs-  CTAB , fair symmetrical air movement CV- S1, S2 normal, regular  Abd-diminished B.Sounds, Abd Soft, No significant tenderness, ileostomy bag with output, ventral hernia and parastomal hernia noted Extremity/Skin:- No  edema, pedal pulses present  Psych-affect is appropriate, oriented  x3 Neuro-generalized weakness, no new focal deficits, no tremors  Data Reviewed: I have personally reviewed following labs and imaging studies  CBC: Recent Labs  Lab 08/06/23 2148 08/07/23 0501 08/08/23 0515 08/09/23 0557 08/10/23 0513 08/11/23 0510  WBC 8.0 8.6 10.0 10.1 13.3* 13.3*  NEUTROABS 5.0  --   --   --   --   --   HGB 13.0 11.8* 12.3 12.3 11.3* 9.7*  HCT 40.3 35.7* 38.8 37.1 36.1 31.5*  MCV 98.1 97.8 98.0 98.9 102.6* 104.3*  PLT 290 249 319 310 305 253   Basic Metabolic Panel: Recent Labs  Lab 08/07/23 0501 08/08/23 0515 08/09/23 0557 08/10/23 0503 08/11/23 0510  NA 135 141 140 147* 144  K 3.8 3.7 3.3* 3.0* 3.3*  CL 96* 88* 83* 92* 99  CO2 24 31 31  34* 32  GLUCOSE 139* 158* 135* 122* 104*  BUN 58* 85* 106* 96* 75*  CREATININE 4.49* 6.40* 8.07* 5.92* 3.44*  CALCIUM  8.7* 8.3* 6.7* 6.9* 8.4*  MG 2.0  --   --  2.3  --   PHOS  5.4*  --   --  5.7*  --    GFR: Estimated Creatinine Clearance: 19.9 mL/min (A) (by C-G formula based on SCr of 3.44 mg/dL (H)). Liver Function Tests: Recent Labs  Lab 08/06/23 2148 08/07/23 0501 08/10/23 0503  AST 23 17  --   ALT 20 17  --   ALKPHOS 100 86  --   BILITOT 0.8 1.0  --   PROT 8.1 7.1  --   ALBUMIN  4.2 3.8 3.2*   Radiology Studies: NM Pulmonary Perfusion Result Date: 08/10/2023 CLINICAL DATA:  Chest pain with shortness of breath for a few days. Concern for pulmonary embolism. EXAM: NUCLEAR MEDICINE PERFUSION LUNG SCAN TECHNIQUE: Perfusion images were obtained in multiple projections after intravenous injection of radiopharmaceutical. No ventilation images obtained. RADIOPHARMACEUTICALS:  4.2 mCi Tc-64m MAA IV COMPARISON:  Bilateral lower extremity venous Doppler ultrasound and chest radiographs same date. Chest CTA 10/02/2022 FINDINGS: No focal perfusion defects are demonstrated. The pulmonary perfusion appears within normal limits, without evidence of acute pulmonary embolism. IMPRESSION: No evidence of acute pulmonary  embolism on perfusion scintigraphy by PISAPED criteria. Electronically Signed   By: Elmon Hagedorn M.D.   On: 08/10/2023 16:40   DG CHEST PORT 1 VIEW Result Date: 08/10/2023 CLINICAL DATA:  Chest pain. EXAM: PORTABLE CHEST 1 VIEW COMPARISON:  Chest radiograph dated 01/19/2022. FINDINGS: Enteric tube with tip in the region of the GE junction. Recommend further advancing by additional 8 cm. Shallow inspiration with bibasilar atelectasis. No focal consolidation, pleural effusion, pneumothorax. Stable cardiac silhouette. Status post repair of the thoracic aorta median sternotomy wires. No acute osseous pathology. IMPRESSION: 1. Enteric tube with tip in the region of the GE junction. Recommend further advancing by additional 8 cm. 2. Shallow inspiration with bibasilar atelectasis. Electronically Signed   By: Angus Bark M.D.   On: 08/10/2023 14:23   US  Venous Img Lower Bilateral (DVT) Result Date: 08/10/2023 CLINICAL DATA:  Pulmonary emboli EXAM: BILATERAL LOWER EXTREMITY VENOUS DOPPLER ULTRASOUND TECHNIQUE: Gray-scale sonography with compression, as well as color and duplex ultrasound, were performed to evaluate the deep venous system(s) from the level of the common femoral vein through the popliteal and proximal calf veins. COMPARISON:  None Available. FINDINGS: VENOUS Normal compressibility of the common femoral, superficial femoral, and popliteal veins, as well as the visualized calf veins. Visualized portions of profunda femoral vein and great saphenous vein unremarkable. No filling defects to suggest DVT on grayscale or color Doppler imaging. Doppler waveforms show normal direction of venous flow, normal respiratory plasticity and response to augmentation. Limited views of the contralateral common femoral vein are unremarkable. OTHER None. Limitations: none IMPRESSION: Negative. Electronically Signed   By: Reagan Camera M.D.   On: 08/10/2023 12:51   DG Abd 2 Views Result Date: 08/10/2023 CLINICAL DATA:   Follow-up small bowel obstruction. EXAM: ABDOMEN - 2 VIEW COMPARISON:  08/07/23 FINDINGS: Enteric tube tip is below the level of the GE junction. Signs of previous median sternotomy. 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 Persistent dilated loops of small bowel are identified measuring up to 5.1 cm. Paucity of colonic/rectal gas. No supine evidence for pneumoperitoneum. IMPRESSION: Persistent dilated loops of small bowel compatible with small bowel obstruction. Electronically Signed   By: Kimberley Penman M.D.   On: 08/10/2023 06:50   Scheduled Meds:  heparin  injection (subcutaneous)  5,000 Units Subcutaneous Q8H   pantoprazole  (PROTONIX ) IV  40 mg Intravenous Q12H   Continuous Infusions:  sodium chloride  10 mL/hr at 08/11/23 1033  lactated ringers  125 mL/hr at 08/11/23 1406    LOS: 4 days   Colin Dawley M.D on 08/11/2023 at 3:12 PM  Go to [email or web address] - for contact info  [facility] - Office  [phone]  If 7PM-7AM, please contact night-coverage [email or web address] 08/11/2023, 3:12 PM

## 2023-08-11 NOTE — Progress Notes (Signed)
 [facility] Progress Note     Subjective: Another episode last night of tachycardia, concern for A fib with RVR reported RN. Patient says this was when she was getting up to go to the restroom to urinate. 100 output documented in the bag. No gas.   NG output is down some and definitely more clear and less bilious in the container now.   Creatinine improving and UOP picking up; US  and VQ scan negative for DVT or PE.   Objective: Vital signs in last 24 hours: Temp:  [97.8 F (36.6 C)-99.8 F (37.7 C)] 99.1 F (37.3 C) (05/06 0753) Pulse Rate:  [84-135] 135 (05/06 0753) Resp:  [18-28] 28 (05/06 0753) BP: (94-126)/(54-75) 121/61 (05/06 0753) SpO2:  [94 %-98 %] 94 % (05/06 0753) Weight:  [93.8 kg] 93.8 kg (05/06 0500) Last BM Date : 08/10/23  Intake/Output from previous day: 05/05 0701 - 05/06 0700 In: 2981 [P.O.:480; I.V.:2053.3; IV Piggyback:447.8] Out: 3175 [Urine:425; Emesis/NG output:2650; Stool:100] Intake/Output this shift: No intake/output data recorded.  General appearance: alert and no distress GI: soft, reducible hernia, nontender  Lab Results:  Recent Labs    08/10/23 0513 08/11/23 0510  WBC 13.3* 13.3*  HGB 11.3* 9.7*  HCT 36.1 31.5*  PLT 305 253   BMET Recent Labs    08/10/23 0503 08/11/23 0510  NA 147* 144  K 3.0* 3.3*  CL 92* 99  CO2 34* 32  GLUCOSE 122* 104*  BUN 96* 75*  CREATININE 5.92* 3.44*  CALCIUM  6.9* 8.4*   PT/INR No results for input(s): "LABPROT", "INR" in the last 72 hours.  Studies/Results: NM Pulmonary Perfusion Result Date: 08/10/2023 CLINICAL DATA:  Chest pain with shortness of breath for a few days. Concern for pulmonary embolism. EXAM: NUCLEAR MEDICINE PERFUSION LUNG SCAN TECHNIQUE: Perfusion images were obtained in multiple projections after intravenous injection of radiopharmaceutical. No ventilation images obtained. RADIOPHARMACEUTICALS:  4.2 mCi Tc-28m MAA IV COMPARISON:  Bilateral lower extremity venous  Doppler ultrasound and chest radiographs same date. Chest CTA 10/02/2022 FINDINGS: No focal perfusion defects are demonstrated. The pulmonary perfusion appears within normal limits, without evidence of acute pulmonary embolism. IMPRESSION: No evidence of acute pulmonary embolism on perfusion scintigraphy by PISAPED criteria. Electronically Signed   By: Elmon Hagedorn M.D.   On: 08/10/2023 16:40   DG CHEST PORT 1 VIEW Result Date: 08/10/2023 CLINICAL DATA:  Chest pain. EXAM: PORTABLE CHEST 1 VIEW COMPARISON:  Chest radiograph dated 01/19/2022. FINDINGS: Enteric tube with tip in the region of the GE junction. Recommend further advancing by additional 8 cm. Shallow inspiration with bibasilar atelectasis. No focal consolidation, pleural effusion, pneumothorax. Stable cardiac silhouette. Status post repair of the thoracic aorta median sternotomy wires. No acute osseous pathology. IMPRESSION: 1. Enteric tube with tip in the region of the GE junction. Recommend further advancing by additional 8 cm. 2. Shallow inspiration with bibasilar atelectasis. Electronically Signed   By: Angus Bark M.D.   On: 08/10/2023 14:23   US  Venous Img Lower Bilateral (DVT) Result Date: 08/10/2023 CLINICAL DATA:  Pulmonary emboli EXAM: BILATERAL LOWER EXTREMITY VENOUS DOPPLER ULTRASOUND TECHNIQUE: Gray-scale sonography with compression, as well as color and duplex ultrasound, were performed to evaluate the deep venous system(s) from the level of the common femoral vein through the popliteal and proximal calf veins. COMPARISON:  None Available. FINDINGS: VENOUS Normal compressibility of the common femoral, superficial femoral, and popliteal veins, as well as the visualized calf veins. Visualized portions of profunda femoral vein and great saphenous vein unremarkable.  No filling defects to suggest DVT on grayscale or color Doppler imaging. Doppler waveforms show normal direction of venous flow, normal respiratory plasticity and response  to augmentation. Limited views of the contralateral common femoral vein are unremarkable. OTHER None. Limitations: none IMPRESSION: Negative. Electronically Signed   By: Reagan Camera M.D.   On: 08/10/2023 12:51   DG Abd 2 Views Result Date: 08/10/2023 CLINICAL DATA:  Follow-up small bowel obstruction. EXAM: ABDOMEN - 2 VIEW COMPARISON:  08/07/23 FINDINGS: Enteric tube tip is below the level of the GE junction. Signs of previous median sternotomy. 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 Persistent dilated loops of small bowel are identified measuring up to 5.1 cm. Paucity of colonic/rectal gas. No supine evidence for pneumoperitoneum. IMPRESSION: Persistent dilated loops of small bowel compatible with small bowel obstruction. Electronically Signed   By: Kimberley Penman M.D.   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 Improving Creatinine and abdominal exam. Having this Cardiac events. Cardiology consult pending, for risk stratification pending any need for surgery. Want to hopefully avoid surgery at this time as after discussion with my partners if I had to operate the safest thing would be to redo the ileostomy and minimize the procedure and risk.  This would delay any ileostomy reversal for the patient. Hopefully SBO continues to resolve and patient able to get outpatient follow up for ileostomy reversal. She had pursued this partially in 09/2021 but did not do the colonoscopy that Dr. Aniceto Barley requested and never saw the surgery team after based on the notes I can see.   Npo, NG  Can have sips of water , ice and 1 can of ginger ale a shift for comfort with NG suction (patient had been threatening to leave [facility])  Cardiology evaluation, risk stratification  AKI improving  Being more conservative with this patient and not doing SBFT given her  issues with AKI and cardiology issues, exam improving overall each day with regards to abdomen.    LOS: 4 days    Dorothy Harris 08/11/2023

## 2023-08-11 NOTE — Progress Notes (Signed)
 Patient ID: Dorothy Harris, female   DOB: [birth year], 63 y.o.   MRN: [id number] S: No new complaints. O:BP 102/69 (BP Location: Left Arm)   Pulse 76   Temp 99 F (37.2 C) (Oral)   Resp (!) 22   Ht 5\' 7"  (1.702 m)   Wt 93.8 kg   SpO2 96%   BMI 32.39 kg/m   Intake/Output Summary (Last 24 hours) at 08/11/2023 0956 Last data filed at 08/11/2023 0520 Gross per 24 hour  Intake 2741.03 ml  Output 3175 ml  Net -433.97 ml   Intake/Output: I/O last 3 completed shifts: In: 3468 [P.O.:480; I.V.:2053.3; NG/GT:487; IV Piggyback:447.8] Out: 7275 [Urine:1825; Emesis/NG output:5250; Stool:200]  Intake/Output this shift:  No intake/output data recorded. Weight change: 6.7 kg Gen: NAD CVS: RRR Resp:CTA WUJ:[id number], +BS, nontender, ostomy in RLQ3 Ext:no edema  Recent Labs  Lab 08/06/23 2148 08/07/23 0501 08/08/23 0515 08/09/23 0557 08/10/23 0503 08/11/23 0510  NA 134* 135 141 140 147* 144  K 4.0 3.8 3.7 3.3* 3.0* 3.3*  CL 96* 96* 88* 83* 92* 99  CO2 19* 24 31 31  34* 32  GLUCOSE 189* 139* 158* 135* 122* 104*  BUN 57* 58* 85* 106* 96* 75*  CREATININE 4.59* 4.49* 6.40* 8.07* 5.92* 3.44*  ALBUMIN  4.2 3.8  --   --  3.2*  --   CALCIUM  9.2 8.7* 8.3* 6.7* 6.9* 8.4*  PHOS  --  5.4*  --   --  5.7*  --   AST 23 17  --   --   --   --   ALT 20 17  --   --   --   --    Liver Function Tests: Recent Labs  Lab 08/06/23 2148 08/07/23 0501 08/10/23 0503  AST 23 17  --   ALT 20 17  --   ALKPHOS 100 86  --   BILITOT 0.8 1.0  --   PROT 8.1 7.1  --   ALBUMIN  4.2 3.8 3.2*   Recent Labs  Lab 08/06/23 2148  LIPASE 31   No results for input(s): "AMMONIA" in the last 168 hours. CBC: Recent Labs  Lab 08/06/23 2148 08/07/23 0501 08/08/23 0515 08/09/23 0557 08/10/23 0513 08/11/23 0510  WBC 8.0 8.6 10.0 10.1 13.3* 13.3*  NEUTROABS 5.0  --   --   --   --   --   HGB 13.0 11.8* 12.3 12.3 11.3* 9.7*  HCT 40.3 35.7* 38.8 37.1 36.1 31.5*  MCV 98.1 97.8 98.0 98.9 102.6* 104.3*  PLT 290 249 319  310 305 253   Cardiac Enzymes: No results for input(s): "CKTOTAL", "CKMB", "CKMBINDEX", "TROPONINI" in the last 168 hours. CBG: Recent Labs  Lab 08/10/23 0451  GLUCAP 118*    Iron Studies: No results for input(s): "IRON", "TIBC", "TRANSFERRIN", "FERRITIN" in the last 72 hours. Studies/Results: NM Pulmonary Perfusion Result Date: 08/10/2023 CLINICAL DATA:  Chest pain with shortness of breath for a few days. Concern for pulmonary embolism. EXAM: NUCLEAR MEDICINE PERFUSION LUNG SCAN TECHNIQUE: Perfusion images were obtained in multiple projections after intravenous injection of radiopharmaceutical. No ventilation images obtained. RADIOPHARMACEUTICALS:  4.2 mCi Tc-64m MAA IV COMPARISON:  Bilateral lower extremity venous Doppler ultrasound and chest radiographs same date. Chest CTA 10/02/2022 FINDINGS: No focal perfusion defects are demonstrated. The pulmonary perfusion appears within normal limits, without evidence of acute pulmonary embolism. IMPRESSION: No evidence of acute pulmonary embolism on perfusion scintigraphy by PISAPED criteria. Electronically Signed   By: Elmon Hagedorn M.D.   On:  08/10/2023 16:40   DG CHEST PORT 1 VIEW Result Date: 08/10/2023 CLINICAL DATA:  Chest pain. EXAM: PORTABLE CHEST 1 VIEW COMPARISON:  Chest radiograph dated 01/19/2022. FINDINGS: Enteric tube with tip in the region of the GE junction. Recommend further advancing by additional 8 cm. Shallow inspiration with bibasilar atelectasis. No focal consolidation, pleural effusion, pneumothorax. Stable cardiac silhouette. Status post repair of the thoracic aorta median sternotomy wires. No acute osseous pathology. IMPRESSION: 1. Enteric tube with tip in the region of the GE junction. Recommend further advancing by additional 8 cm. 2. Shallow inspiration with bibasilar atelectasis. Electronically Signed   By: Angus Bark M.D.   On: 08/10/2023 14:23   US  Venous Img Lower Bilateral (DVT) Result Date: 08/10/2023 CLINICAL  DATA:  Pulmonary emboli EXAM: BILATERAL LOWER EXTREMITY VENOUS DOPPLER ULTRASOUND TECHNIQUE: Gray-scale sonography with compression, as well as color and duplex ultrasound, were performed to evaluate the deep venous system(s) from the level of the common femoral vein through the popliteal and proximal calf veins. COMPARISON:  None Available. FINDINGS: VENOUS Normal compressibility of the common femoral, superficial femoral, and popliteal veins, as well as the visualized calf veins. Visualized portions of profunda femoral vein and great saphenous vein unremarkable. No filling defects to suggest DVT on grayscale or color Doppler imaging. Doppler waveforms show normal direction of venous flow, normal respiratory plasticity and response to augmentation. Limited views of the contralateral common femoral vein are unremarkable. OTHER None. Limitations: none IMPRESSION: Negative. Electronically Signed   By: Reagan Camera M.D.   On: 08/10/2023 12:51   DG Abd 2 Views Result Date: 08/10/2023 CLINICAL DATA:  Follow-up small bowel obstruction. EXAM: ABDOMEN - 2 VIEW COMPARISON:  08/07/23 FINDINGS: Enteric tube tip is below the level of the GE junction. Signs of previous median sternotomy. 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 Persistent dilated loops of small bowel are identified measuring up to 5.1 cm. Paucity of colonic/rectal gas. No supine evidence for pneumoperitoneum. IMPRESSION: Persistent dilated loops of small bowel compatible with small bowel obstruction. Electronically Signed   By: Kimberley Penman M.D.   On: 08/10/2023 06:50    heparin  injection (subcutaneous)  5,000 Units Subcutaneous Q8H   pantoprazole  (PROTONIX ) IV  40 mg Intravenous Q12H    BMET    Component Value Date/Time   NA 144 08/11/2023 0510   NA 140 05/05/2023 0830   K 3.3 (L) 08/11/2023 0510   CL 99 08/11/2023 0510   CO2 32 08/11/2023 0510   GLUCOSE 104 (H) 08/11/2023 0510   BUN 75 (H) 08/11/2023 0510   BUN 31 (H) 05/05/2023 0830    CREATININE 3.44 (H) 08/11/2023 0510   CREATININE 1.69 (H) 08/21/2017 0935   CALCIUM  8.4 (L) 08/11/2023 0510   GFRNONAA 14 (L) 08/11/2023 0510   GFRNONAA 33 (L) 08/21/2017 0935   GFRAA 53 (L) 10/07/2017 0720   GFRAA 39 (L) 08/21/2017 0935   CBC    Component Value Date/Time   WBC 13.3 (H) 08/11/2023 0510   RBC 3.02 (L) 08/11/2023 0510   HGB 9.7 (L) 08/11/2023 0510   HGB 10.6 (L) 05/05/2023 0830   HCT 31.5 (L) 08/11/2023 0510   HCT 33.0 (L) 05/05/2023 0830   PLT 253 08/11/2023 0510   PLT 245 05/05/2023 0830   MCV 104.3 (H) 08/11/2023 0510   MCV 95 05/05/2023 0830   MCH 32.1 08/11/2023 0510   MCHC 30.8 08/11/2023 0510   RDW 15.1 08/11/2023 0510   RDW 15.6 (H) 05/05/2023 0830   LYMPHSABS 1.5  08/06/2023 2148   LYMPHSABS 1.7 05/05/2023 0830   MONOABS 1.4 (H) 08/06/2023 2148   EOSABS 0.1 08/06/2023 2148   EOSABS 0.3 05/05/2023 0830   BASOSABS 0.0 08/06/2023 2148   BASOSABS 0.1 05/05/2023 0830     A Nonoliguric AKI on CKD4 likely due to hypovolemia/prerenal insults from #2.  Baseline creatinine is around 2.0.  Scr improving to 3.44 SBO with significant GI output from NG tube decompression with historical significant abdominal history including ostomy and hemicolectomy Hypertension: Blood pressures fairly stable, off all medications Developing very mild hyponatremia -does not need free water  as much as needs volume replacement Hypokalemia from GI losses Metabolic alkalosis from GI losses   P Improved creatinine is reassuring and heading towards baseline of 2. Would continue hydration with isotonic fluids, LR 1 L bolus followed by 125 mL/h Agree with potassium supplementation, continue aggressive approach Surgery following for management of SBO Medication Issues; Preferred narcotic agents for pain control are hydromorphone , fentanyl , and methadone. Morphine  should not be used.  Baclofen should be avoided Avoid oral sodium phosphate  and magnesium  citrate based laxatives / bowel  preps   Benjamin Brands, MD [facility]

## 2023-08-11 NOTE — Progress Notes (Signed)
 Bladder scanned patient, bladder scan read 390 mL. Patient wanted to attempt to get up to [facility]. Patient assisted with two assist, urine output measured 350 mL. Patient had increased difficulty ambulating back to bed. Tele called an reported patients heart rate in the 160s. Patient given time to recover, heart rate holding between 140-160s. MD Elyse Hand made aware. Patient given PRN Lopressor  due to HR holding greater than 120. EKG obtained reading Atrial fib with rapid ventricular response, moderate voltage criteria for LVH and anterolateral infarct. MD Elyse Hand made aware. Vital signs: T-98.1, BP-94/54, R-18, HR-133, O2-95% at 2 liters. No new orders at this time.

## 2023-08-11 NOTE — Progress Notes (Signed)
 NG output today was 1150, ileostomy 75.  Unable to stand up to void due to foot tenderness and bladder scan was 353 at 1700.   Dr. Charolett Copes ordered to rescan at 2000.  CCMD called several times today with report of elevated heartrate and last call was pulse 172, RVR , PVC's.  Received metoprolol  prn at 1400.  EKG completed this morning. Echo ordered.

## 2023-08-11 NOTE — Plan of Care (Signed)
  Problem: Education: Goal: Knowledge of General Education information will improve Description: Including pain rating scale, medication(s)/side effects and non-pharmacologic comfort measures Outcome: Progressing   Problem: Elimination: Goal: Will not experience complications related to bowel motility Outcome: Progressing   Problem: Pain Managment: Goal: General experience of comfort will improve and/or be controlled Outcome: Progressing   Problem: Skin Integrity: Goal: Risk for impaired skin integrity will decrease Outcome: Progressing

## 2023-08-11 NOTE — Progress Notes (Signed)
   08/11/23 1610  Assess: MEWS Score  Temp 98.1 F (36.7 C)  BP (!) 94/54  MAP (mmHg) 65  Pulse Rate (!) 133  Resp 18  SpO2 95 %  O2 Device Nasal Cannula  O2 Flow Rate (L/min) 2 L/min  Assess: MEWS Score  MEWS Temp 0  MEWS Systolic 1  MEWS Pulse 3  MEWS RR 0  MEWS LOC 0  MEWS Score 4  MEWS Score Color Red  Assess: if the MEWS score is Yellow or Red  Were vital signs accurate and taken at a resting state? Yes  Does the patient meet 2 or more of the SIRS criteria? No  MEWS guidelines implemented  Yes, red  Treat  MEWS Interventions Considered administering scheduled or prn medications/treatments as ordered  Take Vital Signs  Increase Vital Sign Frequency  Red: Q1hr x2, continue Q4hrs until patient remains green for 12hrs  Escalate  MEWS: Escalate Red: Discuss with charge nurse and notify provider. Consider notifying RRT. If remains red for 2 hours consider need for higher level of care  Notify: Charge Nurse/RN  Name of Charge Nurse/RN Notified [facility] RN  Provider Notification  Provider Name/Title MD Elyse Hand  Date Provider Notified 08/11/23  Time Provider Notified [phone]  Method of Notification Page (secure chat)  Notification Reason Other (Comment) (Red Mews)  Assess: SIRS CRITERIA  SIRS Temperature  0  SIRS Respirations  0  SIRS Pulse 1  SIRS WBC 0  SIRS Score Sum  1

## 2023-08-12 ENCOUNTER — Inpatient Hospital Stay (HOSPITAL_COMMUNITY)

## 2023-08-12 ENCOUNTER — Encounter (HOSPITAL_COMMUNITY): Payer: Self-pay | Admitting: Certified Registered Nurse Anesthetist

## 2023-08-12 ENCOUNTER — Other Ambulatory Visit: Payer: Self-pay

## 2023-08-12 DIAGNOSIS — K56609 Unspecified intestinal obstruction, unspecified as to partial versus complete obstruction: Secondary | ICD-10-CM | POA: Diagnosis not present

## 2023-08-12 DIAGNOSIS — N179 Acute kidney failure, unspecified: Secondary | ICD-10-CM | POA: Diagnosis not present

## 2023-08-12 DIAGNOSIS — I10 Essential (primary) hypertension: Secondary | ICD-10-CM | POA: Diagnosis not present

## 2023-08-12 DIAGNOSIS — Z932 Ileostomy status: Secondary | ICD-10-CM | POA: Diagnosis not present

## 2023-08-12 DIAGNOSIS — I480 Paroxysmal atrial fibrillation: Secondary | ICD-10-CM | POA: Diagnosis not present

## 2023-08-12 DIAGNOSIS — I4891 Unspecified atrial fibrillation: Secondary | ICD-10-CM | POA: Diagnosis not present

## 2023-08-12 DIAGNOSIS — Z01810 Encounter for preprocedural cardiovascular examination: Secondary | ICD-10-CM

## 2023-08-12 DIAGNOSIS — Z9889 Other specified postprocedural states: Secondary | ICD-10-CM

## 2023-08-12 DIAGNOSIS — I7143 Infrarenal abdominal aortic aneurysm, without rupture: Secondary | ICD-10-CM | POA: Diagnosis not present

## 2023-08-12 DIAGNOSIS — K430 Incisional hernia with obstruction, without gangrene: Secondary | ICD-10-CM | POA: Diagnosis not present

## 2023-08-12 DIAGNOSIS — I9789 Other postprocedural complications and disorders of the circulatory system, not elsewhere classified: Secondary | ICD-10-CM

## 2023-08-12 LAB — CBC
HCT: 33.6 % — ABNORMAL LOW (ref 36.0–46.0)
Hemoglobin: 10.1 g/dL — ABNORMAL LOW (ref 12.0–15.0)
MCH: 31.2 pg (ref 26.0–34.0)
MCHC: 30.1 g/dL (ref 30.0–36.0)
MCV: 103.7 fL — ABNORMAL HIGH (ref 80.0–100.0)
Platelets: 250 10*3/uL (ref 150–400)
RBC: 3.24 MIL/uL — ABNORMAL LOW (ref 3.87–5.11)
RDW: 15.1 % (ref 11.5–15.5)
WBC: 16.2 10*3/uL — ABNORMAL HIGH (ref 4.0–10.5)
nRBC: 0.4 % — ABNORMAL HIGH (ref 0.0–0.2)

## 2023-08-12 LAB — URINALYSIS, ROUTINE W REFLEX MICROSCOPIC
Bilirubin Urine: NEGATIVE
Glucose, UA: NEGATIVE mg/dL
Hgb urine dipstick: NEGATIVE
Ketones, ur: 5 mg/dL — AB
Leukocytes,Ua: NEGATIVE
Nitrite: NEGATIVE
Protein, ur: 30 mg/dL — AB
Specific Gravity, Urine: 1.015 (ref 1.005–1.030)
pH: 5 (ref 5.0–8.0)

## 2023-08-12 LAB — RENAL FUNCTION PANEL
Albumin: 2.4 g/dL — ABNORMAL LOW (ref 3.5–5.0)
Anion gap: 16 — ABNORMAL HIGH (ref 5–15)
BUN: 70 mg/dL — ABNORMAL HIGH (ref 8–23)
CO2: 28 mmol/L (ref 22–32)
Calcium: 8 mg/dL — ABNORMAL LOW (ref 8.9–10.3)
Chloride: 99 mmol/L (ref 98–111)
Creatinine, Ser: 2.88 mg/dL — ABNORMAL HIGH (ref 0.44–1.00)
GFR, Estimated: 18 mL/min — ABNORMAL LOW (ref >60)
Glucose, Bld: 107 mg/dL — ABNORMAL HIGH (ref 70–99)
Phosphorus: 3.8 mg/dL (ref 2.5–4.6)
Potassium: 3.7 mmol/L (ref 3.5–5.1)
Sodium: 143 mmol/L (ref 135–145)

## 2023-08-12 LAB — ECHOCARDIOGRAM COMPLETE
AR max vel: 2.66 cm2
AV Peak grad: 7.7 mmHg
Ao pk vel: 1.39 m/s
Area-P 1/2: 3.95 cm2
Height: 67 in
MV VTI: 3.18 cm2
S' Lateral: 2.4 cm
Weight: 3276.92 [oz_av]

## 2023-08-12 LAB — MAGNESIUM: Magnesium: 1.9 mg/dL (ref 1.7–2.4)

## 2023-08-12 LAB — MRSA NEXT GEN BY PCR, NASAL: MRSA by PCR Next Gen: NOT DETECTED

## 2023-08-12 MED ORDER — AMIODARONE HCL IN DEXTROSE 360-4.14 MG/200ML-% IV SOLN
30.0000 mg/h | INTRAVENOUS | Status: DC
  Administered 2023-08-12 – 2023-08-14 (×6): 30 mg/h via INTRAVENOUS
  Administered 2023-08-15 – 2023-08-17 (×8): 60 mg/h via INTRAVENOUS
  Administered 2023-08-18 – 2023-08-25 (×12): 30 mg/h via INTRAVENOUS
  Filled 2023-08-12 (×34): qty 200

## 2023-08-12 MED ORDER — CHLORHEXIDINE GLUCONATE CLOTH 2 % EX PADS
6.0000 | MEDICATED_PAD | Freq: Every day | CUTANEOUS | Status: DC
  Administered 2023-08-12 – 2023-08-30 (×20): 6 via TOPICAL

## 2023-08-12 MED ORDER — SODIUM CHLORIDE 0.9% FLUSH: 10.0000 mL | INTRAVENOUS | Status: DC | PRN

## 2023-08-12 MED ORDER — DILTIAZEM HCL 25 MG/5ML IV SOLN
5.0000 mg | Freq: Once | INTRAVENOUS | Status: AC
  Administered 2023-08-12: 5 mg via INTRAVENOUS
  Filled 2023-08-12: qty 5

## 2023-08-12 MED ORDER — SODIUM CHLORIDE 0.9 % IV BOLUS
500.0000 mL | Freq: Once | INTRAVENOUS | Status: AC
Start: 2023-08-12 — End: 2023-08-12
  Administered 2023-08-12: 500 mL via INTRAVENOUS

## 2023-08-12 MED ORDER — SODIUM CHLORIDE 0.9 % IV SOLN: 250.0000 mL | INTRAVENOUS | Status: AC

## 2023-08-12 MED ORDER — PERFLUTREN LIPID MICROSPHERE
1.0000 mL | INTRAVENOUS | Status: AC | PRN
  Administered 2023-08-12: 2 mL via INTRAVENOUS

## 2023-08-12 MED ORDER — NOREPINEPHRINE 4 MG/250ML-% IV SOLN
2.0000 ug/min | INTRAVENOUS | Status: DC
Start: 2023-08-12 — End: 2023-08-14
  Administered 2023-08-12: 2 ug/min via INTRAVENOUS
  Filled 2023-08-12: qty 250

## 2023-08-12 MED ORDER — SODIUM CHLORIDE 0.9% FLUSH
10.0000 mL | Freq: Two times a day (BID) | INTRAVENOUS | Status: DC
  Administered 2023-08-12 – 2023-08-15 (×6): 10 mL
  Administered 2023-08-15: 20 mL
  Administered 2023-08-16 – 2023-08-17 (×3): 10 mL
  Administered 2023-08-18: 20 mL
  Administered 2023-08-18 – 2023-08-29 (×20): 10 mL

## 2023-08-12 MED ORDER — AMIODARONE LOAD VIA INFUSION
150.0000 mg | Freq: Once | INTRAVENOUS | Status: AC
  Administered 2023-08-12: 150 mg via INTRAVENOUS
  Filled 2023-08-12: qty 83.34

## 2023-08-12 MED ORDER — AMIODARONE HCL IN DEXTROSE 360-4.14 MG/200ML-% IV SOLN
60.0000 mg/h | INTRAVENOUS | Status: AC
  Administered 2023-08-12 (×2): 60 mg/h via INTRAVENOUS
  Filled 2023-08-12: qty 200

## 2023-08-12 MED ORDER — SODIUM CHLORIDE 0.9 % IV BOLUS
500.0000 mL | Freq: Once | INTRAVENOUS | Status: AC
  Administered 2023-08-12: 500 mL via INTRAVENOUS

## 2023-08-12 NOTE — Progress Notes (Signed)
   08/12/23 0806  Vitals  BP (!) 88/64  MAP (mmHg) 73  BP Location Right Arm  BP Method Automatic  Patient Position (if appropriate) Lying  Pulse Rate (!) 117  Pulse Rate Source Dinamap  MEWS COLOR  MEWS Score Color Yellow  Oxygen Therapy  SpO2 96 %  O2 Device Nasal Cannula  O2 Flow Rate (L/min) 2 L/min  Pain Assessment  Pain Scale 0-10  Pain Score Asleep  MEWS Score  MEWS Temp 0  MEWS Systolic 1  MEWS Pulse 2  MEWS RR 0  MEWS LOC 0  MEWS Score 3   Pt c/o abd & back pain on first rounds, repositioned for comfort. No relief and pt requested and was given IV Dialudid per order. Check of VS showed HR 115-140 (afib) per monitor and b/p 87/61 (MAP 70). Second 250ml normal saline bolus just completed. Repeat VS now shows as above. Pt currently sleeping but easily awakened to name called. States pain, "getting a little better now". MD [facility] notified of current VS and pt condition. No new orders at this time.
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
## 2023-08-12 NOTE — Progress Notes (Signed)
 Orders received from MD [facility] for transfer to ICU for amiodarone  drip. Pt updated and agreeable. MD Bridges in to evaluate pt as well, plan of care explained to pt and questions answered.  Pt with no recorded urine output for night shift. Bladder scan performed showing > urine. MD Michaelene Admire updated, order received for I&O cath. Pt transferred to ICU per order.

## 2023-08-12 NOTE — Progress Notes (Signed)
 Patient ID: Dorothy Harris, female   DOB: [birth year], 63 y.o.   MRN: [id number] S: pt developed atrial fib with RVR and hypotension.  Transferred to ICU.  Had some urinary retention this morning and required I&O cath with 750 cc of urine. O:BP (!) 87/61 (BP Location: Right Arm)   Pulse (!) 112   Temp 98.4 F (36.9 C) (Oral)   Resp 20   Ht 5\' 7"  (1.702 m)   Wt 92.9 kg   SpO2 96%   BMI 32.08 kg/m   Intake/Output Summary (Last 24 hours) at 08/12/2023 0951 Last data filed at 08/12/2023 0720 Gross per 24 hour  Intake 2972.38 ml  Output 1425 ml  Net 1547.38 ml   Intake/Output: I/O last 3 completed shifts: In: 4543.6 [P.O.:577; I.V.:3076.6; NG/GT:390; IV Piggyback:500] Out: 2575 [Emesis/NG output:2400; Stool:175]  Intake/Output this shift:  Total I/O In: -  Out: 200 [Emesis/NG output:200] Weight change: 4.5 kg Gen: appears uncomfortable XBJ:YNWGN and IRR Resp: CTA Abd: distended, +BS, ostomy in place Ext: no edema  Recent Labs  Lab 08/06/23 2148 08/07/23 0501 08/08/23 0515 08/09/23 0557 08/10/23 0503 08/11/23 0510 08/12/23 0449  NA 134* 135 141 140 147* 144 143  K 4.0 3.8 3.7 3.3* 3.0* 3.3* 3.7  CL 96* 96* 88* 83* 92* 99 99  CO2 19* 24 31 31  34* 32 28  GLUCOSE 189* 139* 158* 135* 122* 104* 107*  BUN 57* 58* 85* 106* 96* 75* 70*  CREATININE 4.59* 4.49* 6.40* 8.07* 5.92* 3.44* 2.88*  ALBUMIN  4.2 3.8  --   --  3.2*  --  2.4*  CALCIUM  9.2 8.7* 8.3* 6.7* 6.9* 8.4* 8.0*  PHOS  --  5.4*  --   --  5.7*  --  3.8  AST 23 17  --   --   --   --   --   ALT 20 17  --   --   --   --   --    Liver Function Tests: Recent Labs  Lab 08/06/23 2148 08/07/23 0501 08/10/23 0503 08/12/23 0449  AST 23 17  --   --   ALT 20 17  --   --   ALKPHOS 100 86  --   --   BILITOT 0.8 1.0  --   --   PROT 8.1 7.1  --   --   ALBUMIN  4.2 3.8 3.2* 2.4*   Recent Labs  Lab 08/06/23 2148  LIPASE 31   No results for input(s): "AMMONIA" in the last 168 hours. CBC: Recent Labs  Lab 08/06/23 2148  08/07/23 0501 08/08/23 0515 08/09/23 0557 08/10/23 0513 08/11/23 0510 08/12/23 0449  WBC 8.0   < > 10.0 10.1 13.3* 13.3* 16.2*  NEUTROABS 5.0  --   --   --   --   --   --   HGB 13.0   < > 12.3 12.3 11.3* 9.7* 10.1*  HCT 40.3   < > 38.8 37.1 36.1 31.5* 33.6*  MCV 98.1   < > 98.0 98.9 102.6* 104.3* 103.7*  PLT 290   < > 319 310 305 253 250   < > = values in this interval not displayed.   Cardiac Enzymes: No results for input(s): "CKTOTAL", "CKMB", "CKMBINDEX", "TROPONINI" in the last 168 hours. CBG: Recent Labs  Lab 08/10/23 0451  GLUCAP 118*    Iron Studies: No results for input(s): "IRON", "TIBC", "TRANSFERRIN", "FERRITIN" in the last 72 hours. Studies/Results: NM Pulmonary Perfusion Result Date: 08/10/2023 CLINICAL DATA:  Chest pain with shortness of breath for a few days. Concern for pulmonary embolism. EXAM: NUCLEAR MEDICINE PERFUSION LUNG SCAN TECHNIQUE: Perfusion images were obtained in multiple projections after intravenous injection of radiopharmaceutical. No ventilation images obtained. RADIOPHARMACEUTICALS:  4.2 mCi Tc-50m MAA IV COMPARISON:  Bilateral lower extremity venous Doppler ultrasound and chest radiographs same date. Chest CTA 10/02/2022 FINDINGS: No focal perfusion defects are demonstrated. The pulmonary perfusion appears within normal limits, without evidence of acute pulmonary embolism. IMPRESSION: No evidence of acute pulmonary embolism on perfusion scintigraphy by PISAPED criteria. Electronically Signed   By: Elmon Hagedorn M.D.   On: 08/10/2023 16:40   DG CHEST PORT 1 VIEW Result Date: 08/10/2023 CLINICAL DATA:  Chest pain. EXAM: PORTABLE CHEST 1 VIEW COMPARISON:  Chest radiograph dated 01/19/2022. FINDINGS: Enteric tube with tip in the region of the GE junction. Recommend further advancing by additional 8 cm. Shallow inspiration with bibasilar atelectasis. No focal consolidation, pleural effusion, pneumothorax. Stable cardiac silhouette. Status post repair of the  thoracic aorta median sternotomy wires. No acute osseous pathology. IMPRESSION: 1. Enteric tube with tip in the region of the GE junction. Recommend further advancing by additional 8 cm. 2. Shallow inspiration with bibasilar atelectasis. Electronically Signed   By: Angus Bark M.D.   On: 08/10/2023 14:23   US  Venous Img Lower Bilateral (DVT) Result Date: 08/10/2023 CLINICAL DATA:  Pulmonary emboli EXAM: BILATERAL LOWER EXTREMITY VENOUS DOPPLER ULTRASOUND TECHNIQUE: Gray-scale sonography with compression, as well as color and duplex ultrasound, were performed to evaluate the deep venous system(s) from the level of the common femoral vein through the popliteal and proximal calf veins. COMPARISON:  None Available. FINDINGS: VENOUS Normal compressibility of the common femoral, superficial femoral, and popliteal veins, as well as the visualized calf veins. Visualized portions of profunda femoral vein and great saphenous vein unremarkable. No filling defects to suggest DVT on grayscale or color Doppler imaging. Doppler waveforms show normal direction of venous flow, normal respiratory plasticity and response to augmentation. Limited views of the contralateral common femoral vein are unremarkable. OTHER None. Limitations: none IMPRESSION: Negative. Electronically Signed   By: Reagan Camera M.D.   On: 08/10/2023 12:51    Chlorhexidine  Gluconate Cloth  6 each Topical Daily   heparin  injection (subcutaneous)  5,000 Units Subcutaneous Q8H   pantoprazole  (PROTONIX ) IV  40 mg Intravenous Q12H    BMET    Component Value Date/Time   NA 143 08/12/2023 0449   NA 140 05/05/2023 0830   K 3.7 08/12/2023 0449   CL 99 08/12/2023 0449   CO2 28 08/12/2023 0449   GLUCOSE 107 (H) 08/12/2023 0449   BUN 70 (H) 08/12/2023 0449   BUN 31 (H) 05/05/2023 0830   CREATININE 2.88 (H) 08/12/2023 0449   CREATININE 1.69 (H) 08/21/2017 0935   CALCIUM  8.0 (L) 08/12/2023 0449   GFRNONAA 18 (L) 08/12/2023 0449   GFRNONAA 33 (L)  08/21/2017 0935   GFRAA 53 (L) 10/07/2017 0720   GFRAA 39 (L) 08/21/2017 0935   CBC    Component Value Date/Time   WBC 16.2 (H) 08/12/2023 0449   RBC 3.24 (L) 08/12/2023 0449   HGB 10.1 (L) 08/12/2023 0449   HGB 10.6 (L) 05/05/2023 0830   HCT 33.6 (L) 08/12/2023 0449   HCT 33.0 (L) 05/05/2023 0830   PLT 250 08/12/2023 0449   PLT 245 05/05/2023 0830   MCV 103.7 (H) 08/12/2023 0449   MCV 95 05/05/2023 0830   MCH 31.2 08/12/2023 0449   MCHC 30.1 08/12/2023  0449   RDW 15.1 08/12/2023 0449   RDW 15.6 (H) 05/05/2023 0830   LYMPHSABS 1.5 08/06/2023 2148   LYMPHSABS 1.7 05/05/2023 0830   MONOABS 1.4 (H) 08/06/2023 2148   EOSABS 0.1 08/06/2023 2148   EOSABS 0.3 05/05/2023 0830   BASOSABS 0.0 08/06/2023 2148   BASOSABS 0.1 05/05/2023 0830    A Nonoliguric AKI on CKD4 likely due to hypovolemia/prerenal insults from #2.  Baseline creatinine is around 2.0.  Scr improving to 2.88 SBO with significant GI output from NG tube decompression with historical significant abdominal history including ostomy and hemicolectomy Atrial fib with RVR and Hypotension: transferred to ICU per [facility]. Urinary retention - had 750 cc on bladder scan s/p I&O cath, may need foley Developing very mild hyponatremia -resolved Hypokalemia from GI losses Metabolic alkalosis from GI losses   P Improved creatinine is reassuring and heading towards baseline of 2. Would continue hydration with isotonic fluids, LR @ 125 mL/h Agree with potassium supplementation, continue aggressive approach Surgery following for management of SBO Medication Issues; Preferred narcotic agents for pain control are hydromorphone , fentanyl , and methadone. Morphine  should not be used.  Baclofen should be avoided Avoid oral sodium phosphate  and magnesium  citrate based laxatives / bowel preps   Benjamin Brands, MD [facility]

## 2023-08-12 NOTE — Progress Notes (Signed)
 Peripherally Inserted Central Catheter Placement  The IV Nurse has discussed with the patient and/or persons authorized to consent for the patient, the purpose of this procedure and the potential benefits and risks involved with this procedure.  The benefits include less needle sticks, lab draws from the catheter, and the patient may be discharged home with the catheter. Risks include, but not limited to, infection, bleeding, blood clot (thrombus formation), and puncture of an artery; nerve damage and irregular heartbeat and possibility to perform a PICC exchange if needed/ordered by physician.  Alternatives to this procedure were also discussed.  Bard Power PICC patient education guide, fact sheet on infection prevention and patient information card has been provided to patient /or left at bedside. Consent given by Dr Norberto Bear to proceed with PICC placement.   PICC Placement Documentation  PICC Double Lumen 08/12/23 Right Brachial 43 cm 2 cm (Active)  Indication for Insertion or Continuance of Line Vasoactive infusions;Prolonged intravenous therapies 08/12/23 1644  Exposed Catheter (cm) 2 cm 08/12/23 1644  Site Assessment Clean, Dry, Intact 08/12/23 1644  Lumen #1 Status Flushed;Saline locked;Blood return noted 08/12/23 1644  Lumen #2 Status Flushed;Saline locked;Blood return noted 08/12/23 1644  Dressing Type Transparent 08/12/23 1644  Dressing Status Antimicrobial disc/dressing in place;Clean, Dry, Intact 08/12/23 1644  Line Care Connections checked and tightened;Lumen 1 cap changed;Lumen 2 cap changed 08/12/23 1644  Dressing Change Due 08/19/23 08/12/23 1644       Dorothy Harris 08/12/2023, 4:47 PM

## 2023-08-12 NOTE — Progress Notes (Signed)
 [facility]  Cardiology deemed patient very high risk for any surgery as I would suspect. They are concerned about her now sepsis status and worsening vitals, wbc.  Abdominal exam has been very reassuring; it has been soft, I have been able to palpate very deeply, I can reduce her hernia, but again the bowel comes right back out; I could visualize peristalsis today.  I worry with her respiratory issues earlier in the week. She could also have aspiration pneumonia.   It would not be unreasonable to repeat CT chest, abd, and pelvis non contrast to look.  Npo, ng Exhausting non operative efforts given patients high risk.   Deena Farrier MD

## 2023-08-12 NOTE — Progress Notes (Addendum)
 [facility]  Reviewed CT. No official radiology read yet. 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 No signs of free air or fluid. I do see some air fluid levels that are on the superior portion of the loop and not dependent. Looks similar to the prior scan and I do not think this is any pneumatosis or anything as the gas appears in the lumen and is superior in location.   There is a wedge like infiltrate on the outer edge right upper lobe.   Have updated Dr. Michaelene Admire and Dr. Larrie Po. Per report from Dr. Michaelene Admire, patient main complaint today has been leg and back pain as she has had 8/10 pain documented. She complained of no abdominal pain for me or Dr. Michaelene Admire.   Deena Farrier, MD [facility] [address] [place]ise [place], [place] [address] [phone] (office)

## 2023-08-12 NOTE — Progress Notes (Signed)
 [facility] Progress Note     Subjective: Patient still with tachycardia overnight, retaining urine but making 750 in bladder and Cr improving.  Abdomen remains soft and no complaints from patient, NG output is much much less.   Objective: Vital signs in last 24 hours: Temp:  [98.3 F (36.8 C)-100.8 F (38.2 C)] 98.4 F (36.9 C) (05/07 0854) Pulse Rate:  [77-146] 112 (05/07 0820) Resp:  [14-26] 20 (05/07 0820) BP: (87-113)/(61-81) 87/61 (05/07 0820) SpO2:  [92 %-97 %] 96 % (05/07 0806) Weight:  [92.9 kg-98.3 kg] 92.9 kg (05/07 0854) Last BM Date : 08/11/23  Intake/Output from previous day: 05/06 0701 - 05/07 0700 In: 2972.4 [P.O.:337; I.V.:1745.4; NG/GT:390; IV Piggyback:500] Out: 1225 [Emesis/NG output:1150; Stool:75] Intake/Output this shift: Total I/O In: -  Out: 200 [Emesis/NG output:200]  General appearance: alert and no distress GI: soft, easily reducible hernia and ostomy pink and prolapsed, easily pushes back in, when I reduce the hernia, bowel pops right back out, I can see peristalsis today   Lab Results:  Recent Labs    08/11/23 0510 08/12/23 0449  WBC 13.3* 16.2*  HGB 9.7* 10.1*  HCT 31.5* 33.6*  PLT 253 250   BMET Recent Labs    08/11/23 0510 08/12/23 0449  NA 144 143  K 3.3* 3.7  CL 99 99  CO2 32 28  GLUCOSE 104* 107*  BUN 75* 70*  CREATININE 3.44* 2.88*  CALCIUM  8.4* 8.0*   PT/INR No results for input(s): "LABPROT", "INR" in the last 72 hours.  Studies/Results: NM Pulmonary Perfusion Result Date: 08/10/2023 CLINICAL DATA:  Chest pain with shortness of breath for a few days. Concern for pulmonary embolism. EXAM: NUCLEAR MEDICINE PERFUSION LUNG SCAN TECHNIQUE: Perfusion images were obtained in multiple projections after intravenous injection of radiopharmaceutical. No ventilation images obtained. RADIOPHARMACEUTICALS:  4.2 mCi Tc-40m MAA IV COMPARISON:  Bilateral lower extremity venous Doppler ultrasound and chest  radiographs same date. Chest CTA 10/02/2022 FINDINGS: No focal perfusion defects are demonstrated. The pulmonary perfusion appears within normal limits, without evidence of acute pulmonary embolism. IMPRESSION: No evidence of acute pulmonary embolism on perfusion scintigraphy by PISAPED criteria. Electronically Signed   By: Elmon Hagedorn M.D.   On: 08/10/2023 16:40   DG CHEST PORT 1 VIEW Result Date: 08/10/2023 CLINICAL DATA:  Chest pain. EXAM: PORTABLE CHEST 1 VIEW COMPARISON:  Chest radiograph dated 01/19/2022. FINDINGS: Enteric tube with tip in the region of the GE junction. Recommend further advancing by additional 8 cm. Shallow inspiration with bibasilar atelectasis. No focal consolidation, pleural effusion, pneumothorax. Stable cardiac silhouette. Status post repair of the thoracic aorta median sternotomy wires. No acute osseous pathology. IMPRESSION: 1. Enteric tube with tip in the region of the GE junction. Recommend further advancing by additional 8 cm. 2. Shallow inspiration with bibasilar atelectasis. Electronically Signed   By: Angus Bark M.D.   On: 08/10/2023 14:23   US  Venous Img Lower Bilateral (DVT) Result Date: 08/10/2023 CLINICAL DATA:  Pulmonary emboli EXAM: BILATERAL LOWER EXTREMITY VENOUS DOPPLER ULTRASOUND TECHNIQUE: Gray-scale sonography with compression, as well as color and duplex ultrasound, were performed to evaluate the deep venous system(s) from the level of the common femoral vein through the popliteal and proximal calf veins. COMPARISON:  None Available. FINDINGS: VENOUS Normal compressibility of the common femoral, superficial femoral, and popliteal veins, as well as the visualized calf veins. Visualized portions of profunda femoral vein and great saphenous vein unremarkable. No filling defects to suggest DVT on grayscale or color Doppler imaging.  Doppler waveforms show normal direction of venous flow, normal respiratory plasticity and response to augmentation. Limited  views of the contralateral common femoral vein are unremarkable. OTHER None. Limitations: none IMPRESSION: Negative. Electronically Signed   By: Reagan Camera M.D.   On: 08/10/2023 12:51    Anti-infectives: Anti-infectives (From admission, onward)    None       Assessment/Plan: Patient with SBO in the setting ventral hernia, with adhesion right at the ventral hernia edge, and end ileostomy with related parastomal hernia. She has been complicated by AKI and now cardiac issue with tachycardia likely all from her dehydration and electrolyte changes. UOP improving and Cr improving, still with retention.    Cardiology says they are seeing her today.  Will need risk stratification for possible Ex lap, Lysis of adhesions, possible SBR and possible end ileostomy revision.  The patient is complicated and has had multiple issues that do not make her prime operative candidate especially when her abdominal exam was Benign.  I did not want to take her back to the OR too soon in the setting of her AKI and cause further injury to her kidneys with anesthesia and any hypotenison. She also now is having tachycardia and was ruled out for PE and NSTEMI by the hospitalist.   Cardiology needs to see her to see if she is appropriate candidate for surgery at [facility] as it does not look like NPO, NG is going to resolve this obstruction.   In an ideal picture, she would resolve this SBO without any surgery, and would be able to get outpatient referral back to [facility]/ Dr. Aniceto Barley for her reversal and would need to complete her colonoscopy prior to surgery for the reversal as she is more complicated with her hernias and history etc.   Unfortunately, this acute SBO does not look like it is going to resolve, so in this acute setting, the most appropriate management would be LOA and possible SBR and revision of the ostomy if needed.  This will delay any reversal for her and also make her more likely to have more hernias in  the future.    Pending cardiology evaluation and patient's progress, I have her tentatively posted for Friday for Ex lap, possible LOA, possible SBR, possible end ileostomy revision.    LOS: 5 days    Awilda Bogus 08/12/2023

## 2023-08-12 NOTE — Progress Notes (Signed)
 [facility]  CT read reassuring. Continue SBO but no worsening pathology, no free air or signs of perforation.  Continue conservative management. Now with picc and start tpn.  Deena Farrier MD

## 2023-08-12 NOTE — Progress Notes (Signed)
 Pt's sister [name] notified per pt request of transfer to ICU.

## 2023-08-12 NOTE — Progress Notes (Signed)
 PROGRESS NOTE  Dorothy Harris, is a 63 y.o. 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 - General Surgery consult from Dr. Collene Dawson appreciated --- the hernia appears to be reducible per general surgeon Continue NGT decompression - Continue IV fluids while NPO - If patient fails conservative approach--general surgeon believes that ideally patient should have a colonoscopy prior to surgical intervention with lysis of adhesions and possible reversal of ileostomy -Per general surgery team there is a possibility of surgical intervention later this week if bowel issues fail to improve with conservative measures -continue NPO status, follow electrolytes and replete as needed -continue to maintain adequate hydration -patient is high risk for suregical intervention.  2) AKI----acute kidney injury on CKD stage IV -due to dehydration/volume depletion in a patient with SBO and n.p.o. status with high output from NG tube -creatinine on admission= 4.59,  baseline creatinine = 2.0 to 2.2  ,  -creatinine peaked at 8.07 (on 08/09/23) -Continue trending down---currently 2.88 with ongoing hydration - Nephrology consult  appreciated - No metabolic acidosis and no hyperkalemia at this time, elevated anion gap noted - patient is volume depleted, -continue to maintain adequate hydration, follow nephrology rec's -Renally adjust medications, avoid nephrotoxic agents / hypotension  Intake/Output Summary (Last 24 hours) at 08/12/2023 1244 Last data filed at 08/12/2023 0920 Gross per 24 hour  Intake 2095.38 ml  Output 1350 ml  Net 745.38 ml    3) atypical chest pains with  elevated Troponin: -EKG nonacute suspect demand ischemia due to AKI and  bowel obstruction.  Troponin 214 >>206-- echo requested - Lower extremity Dopplers and VQ scan negative for DVT and PE respectively 08/11/23 -Cardiology consult requested for tachyarrhythmias and preop clearance; appreciate inputs. Patient is high risk for intervention and at the moment no further acute work up that will change that. -denies CP -has been transferred to SDU for IV amiodarone  and also pressors -holding B-blocker at the moment.   4)Essential hypertension: BP on the soft sides and will require pressors.  - c/n to Hold PTA amlodipine  and oral hydralazine   -follow VS   5)Paroxysmal A-fib: -PTA amiodarone  and Coreg  on hold due to n.p.o. status -May use IV metoprolol  as needed elevated heart rate -Patient was not on anticoagulation PTA -follow echo results -IV amiodarone  started -follow HR   6)HFpEF w/ tte 8/24-EF 65 to 70%, G1DD: Currently volume depleted needing IV fluids due to SBO.   - Be judicious with IV fluids - follow Echo results -continue to follow I's and O's  7)HLD-hold PTA atorvastatin  while n.p.o.  8)Hypokalemia--- due to GI losses, replace as needed and follow trend. -Magnesium  WNL - Keep potassium around 4 especially in view of arrhythmias. -plan is for Mg  around 2.  9)Hypernatremia--- due to dehydration,  - Creatinine is down to 144 from 147 continue fluids as ordered  10)acute urinary retention -in and out cath X 3 without  success of voiding spontaneously; foley catheter placed -will follow response  11)leukocytosis and abd pain -will check CT chest abd and pelvis -holding on abx's initially, but with very low threshold to start coverage (concerning for asp PNA, UTI and/or intrabd process). -follow WBC's trend -continue PRN antipyretics.    Status is: Inpatient   Subcu heparin  for DVT prophylaxis - Protonix  IV for GI prophylaxis  Disposition: The patient is from: Home              Anticipated d/c is to: Home              Anticipated d/c date is: > 3 days              Patient currently is not medically stable to d/c.  Barriers: Not Clinically Stable-   Code Status :  -  Code Status: Full Code   Family Communication:   (patient is alert, awake and coherent)  No family at bedside on today's evaluation.  DVT Prophylaxis  :   - SCDs   heparin  injection 5,000 Units Start: 08/10/23 1400 SCDs Start: 08/07/23 0437   Lab Results  Component Value Date   PLT 250 08/12/2023   Inpatient Medications  Scheduled Meds:  Chlorhexidine  Gluconate Cloth  6 each Topical Daily   heparin  injection (subcutaneous)  5,000 Units Subcutaneous Q8H   pantoprazole  (PROTONIX ) IV  40 mg Intravenous Q12H   Continuous Infusions:  amiodarone  60 mg/hr (08/12/23 0929)   Followed by   amiodarone      PRN Meds:.acetaminophen  **OR** acetaminophen , hydrALAZINE , HYDROmorphone  (DILAUDID ) injection, perflutren lipid microspheres (DEFINITY) IV suspension, phenol, prochlorperazine    Anti-infectives (From admission, onward)    None       Subjective: Dorothy Harris having acute urinary retention and throughout the night experiencing worsening tachy-arrhythmia and low BP. Cr improved.  Objective: Vitals:   08/12/23 1045 08/12/23 1100 08/12/23 1115 08/12/23 1119  BP: (!) 81/45 (!) 82/59 92/63   Pulse: (!) 102     Resp: 14 16 16    Temp:    99.5 F (37.5 C)  TempSrc:    Oral  SpO2: 97%     Weight:      Height:         Intake/Output Summary (Last 24 hours) at 08/12/2023 1244 Last data filed at 08/12/2023 0920 Gross per 24 hour  Intake 2095.38 ml  Output 1350 ml  Net 745.38 ml   Filed Weights   08/11/23 0500 08/12/23 0604 08/12/23 0854  Weight: 93.8 kg 98.3 kg 92.9 kg    Physical Exam General exam: Alert, awake, oriented x 3, NGT in place. Respiratory system: good sat on 2L supplementation. Not using accessory muscles. Cardiovascular system:irregular irregular. No rubs or gallops Gastrointestinal system: Abdomen is mildly distended, but soft and nontender on palpation.decreased but present BS. Central nervous system: moving four limbs spontaneously. No focal neurological deficits. Extremities: No Cyanosis, no clubbing. Skin: No petechiae; colostomy in place with some liquid material in place. Psychiatry: Judgement and insight appear normal. Mood & affect appropriate.  Data Reviewed: I have personally reviewed following labs and imaging studies  CBC: Recent Labs  Lab 08/06/23 2148 08/07/23 0501 08/08/23 0515 08/09/23 0557 08/10/23 0513 08/11/23 0510 08/12/23 0449  WBC 8.0   < > 10.0 10.1 13.3* 13.3* 16.2*  NEUTROABS 5.0  --   --   --   --   --   --  HGB 13.0   < > 12.3 12.3 11.3* 9.7* 10.1*  HCT 40.3   < > 38.8 37.1 36.1 31.5* 33.6*  MCV 98.1   < > 98.0 98.9 102.6* 104.3* 103.7*  PLT 290   < > 319 310 305 253 250   < > = values in this interval not displayed.   Basic Metabolic Panel: Recent Labs  Lab 08/07/23 0501 08/08/23 0515 08/09/23 0557 08/10/23 0503 08/11/23 0510 08/12/23 0449  NA 135 141 140 147* 144 143  K 3.8 3.7 3.3* 3.0* 3.3* 3.7  CL 96* 88* 83* 92* 99 99  CO2 24 31 31  34* 32 28  GLUCOSE 139* 158* 135* 122* 104* 107*  BUN 58* 85* 106* 96* 75* 70*  CREATININE 4.49* 6.40* 8.07* 5.92* 3.44* 2.88*  CALCIUM  8.7* 8.3* 6.7* 6.9* 8.4* 8.0*  MG 2.0  --   --  2.3  --  1.9  PHOS 5.4*  --   --  5.7*  --  3.8   GFR: Estimated Creatinine Clearance: 23.7 mL/min (A) (by C-G  formula based on SCr of 2.88 mg/dL (H)).  Liver Function Tests: Recent Labs  Lab 08/06/23 2148 08/07/23 0501 08/10/23 0503 08/12/23 0449  AST 23 17  --   --   ALT 20 17  --   --   ALKPHOS 100 86  --   --   BILITOT 0.8 1.0  --   --   PROT 8.1 7.1  --   --   ALBUMIN  4.2 3.8 3.2* 2.4*   Radiology Studies: NM Pulmonary Perfusion Result Date: 08/10/2023 CLINICAL DATA:  Chest pain with shortness of breath for a few days. Concern for pulmonary embolism. EXAM: NUCLEAR MEDICINE PERFUSION LUNG SCAN TECHNIQUE: Perfusion images were obtained in multiple projections after intravenous injection of radiopharmaceutical. No ventilation images obtained. RADIOPHARMACEUTICALS:  4.2 mCi Tc-10m MAA IV COMPARISON:  Bilateral lower extremity venous Doppler ultrasound and chest radiographs same date. Chest CTA 10/02/2022 FINDINGS: No focal perfusion defects are demonstrated. The pulmonary perfusion appears within normal limits, without evidence of acute pulmonary embolism. IMPRESSION: No evidence of acute pulmonary embolism on perfusion scintigraphy by PISAPED criteria. Electronically Signed   By: Elmon Hagedorn M.D.   On: 08/10/2023 16:40   Scheduled Meds:  Chlorhexidine  Gluconate Cloth  6 each Topical Daily   heparin  injection (subcutaneous)  5,000 Units Subcutaneous Q8H   pantoprazole  (PROTONIX ) IV  40 mg Intravenous Q12H   Continuous Infusions:  amiodarone  60 mg/hr (08/12/23 0929)   Followed by   amiodarone       LOS: 5 days   Justina Oman M.D on 08/12/2023 at 12:44 PM  CRITICAL CARE Performed by: Justina Oman   Total critical care time: 55 minutes  Critical care time was exclusive of separately billable procedures and treating other patients.  Critical care was necessary to treat or prevent imminent or life-threatening deterioration.  Critical care was time spent personally by me on the following activities: development of treatment plan with patient and/or surrogate as well as nursing,  discussions with consultants, evaluation of patient's response to treatment, examination of patient, obtaining history from patient or surrogate, ordering and performing treatments and interventions, ordering and review of laboratory studies, ordering and review of radiographic studies, pulse oximetry and re-evaluation of patient's condition. Care   Go to [email or web address] - for contact info  [facility] - Office  [phone]  If 7PM-7AM, please contact night-coverage [email or web address] 08/12/2023, 12:44 PM

## 2023-08-13 DIAGNOSIS — Z932 Ileostomy status: Secondary | ICD-10-CM | POA: Diagnosis not present

## 2023-08-13 DIAGNOSIS — N179 Acute kidney failure, unspecified: Secondary | ICD-10-CM | POA: Diagnosis not present

## 2023-08-13 DIAGNOSIS — I4891 Unspecified atrial fibrillation: Secondary | ICD-10-CM | POA: Diagnosis not present

## 2023-08-13 DIAGNOSIS — K430 Incisional hernia with obstruction, without gangrene: Secondary | ICD-10-CM | POA: Diagnosis not present

## 2023-08-13 DIAGNOSIS — K56609 Unspecified intestinal obstruction, unspecified as to partial versus complete obstruction: Secondary | ICD-10-CM | POA: Diagnosis not present

## 2023-08-13 DIAGNOSIS — I10 Essential (primary) hypertension: Secondary | ICD-10-CM | POA: Diagnosis not present

## 2023-08-13 LAB — BASIC METABOLIC PANEL WITH GFR
Anion gap: 14 (ref 5–15)
BUN: 63 mg/dL — ABNORMAL HIGH (ref 8–23)
CO2: 28 mmol/L (ref 22–32)
Calcium: 8 mg/dL — ABNORMAL LOW (ref 8.9–10.3)
Chloride: 99 mmol/L (ref 98–111)
Creatinine, Ser: 2.6 mg/dL — ABNORMAL HIGH (ref 0.44–1.00)
GFR, Estimated: 20 mL/min — ABNORMAL LOW (ref >60)
Glucose, Bld: 126 mg/dL — ABNORMAL HIGH (ref 70–99)
Potassium: 3.3 mmol/L — ABNORMAL LOW (ref 3.5–5.1)
Sodium: 141 mmol/L (ref 135–145)

## 2023-08-13 LAB — GLUCOSE, CAPILLARY
Glucose-Capillary: 111 mg/dL — ABNORMAL HIGH (ref 70–99)
Glucose-Capillary: 154 mg/dL — ABNORMAL HIGH (ref 70–99)
Glucose-Capillary: 94 mg/dL (ref 70–99)

## 2023-08-13 MED ORDER — INSULIN ASPART 100 UNIT/ML IJ SOLN
0.0000 [IU] | Freq: Three times a day (TID) | INTRAMUSCULAR | Status: DC
  Administered 2023-08-14 – 2023-08-16 (×7): 2 [IU] via SUBCUTANEOUS
  Administered 2023-08-16: 3 [IU] via SUBCUTANEOUS

## 2023-08-13 MED ORDER — LACTATED RINGERS IV SOLN: INTRAVENOUS | Status: AC

## 2023-08-13 MED ORDER — POTASSIUM CHLORIDE 10 MEQ/100ML IV SOLN
10.0000 meq | INTRAVENOUS | Status: AC
  Administered 2023-08-13 (×2): 10 meq via INTRAVENOUS
  Filled 2023-08-13 (×2): qty 100

## 2023-08-13 MED ORDER — TRAVASOL 10 % IV SOLN
INTRAVENOUS | Status: AC
  Filled 2023-08-13: qty 576

## 2023-08-13 NOTE — Progress Notes (Signed)
 Dorothy Harris  Assessment/ Plan: Pt is a 63 y.o. 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  # Nonoliguric acute kidney injury on CKD stage IV likely due to prerenal insult/reduced renal perfusion: Baseline creatinine level around 2.0. Marked improvement of serum creatinine level associated with increased urine output.  Continue IV fluid while NPO.  No need for dialysis.  # SBO with significant GI output/NG tube decompression: General Surgery is following.  # Acute urinary retention: Had 750 cc on bladder scan previously required In-N-Out cath, continue bladder scan.  # Hypokalemia: Replete potassium chloride  IV since patient is n.p.o.  # Hyponatremia: Improved.  Renal recovery now. Sign off, please call us  back with question.  Subjective: Seen and examined.  Urine output is 1.5 L.  She has NG tube and surgeon is following closely.  Around 1.2 L of NG tube output as well. Objective Vital signs in last 24 hours: Vitals:   08/13/23 0730 08/13/23 0745 08/13/23 0822 08/13/23 0900  BP: (!) 119/55 118/73  109/68  Pulse: (!) 119 (!) 102  (!) 107  Resp: 16 17  20   Temp:   99.1 F (37.3 C)   TempSrc:   Oral   SpO2: 95% 96%  96%  Weight:      Height:       Weight change: -5.4 kg  Intake/Output Summary (Last 24 hours) at 08/13/2023 0936 Last data filed at 08/13/2023 0910 Gross per 24 hour  Intake 1067.29 ml  Output 2050 ml  Net -982.71 ml       Labs: RENAL PANEL Recent Labs  Lab 08/06/23 2148 08/07/23 0501 08/08/23 0515 08/09/23 0557 08/10/23 0503 08/11/23 0510 08/12/23 0449 08/13/23 0822  NA 134* 135   < > 140 147* 144 143 141  K 4.0 3.8   < > 3.3* 3.0* 3.3* 3.7 3.3*  CL 96* 96*   < > 83* 92* 99 99 99  CO2 19* 24   < >  31 34* 32 28 28  GLUCOSE 189* 139*   < > 135* 122* 104* 107* 126*  BUN 57* 58*   < > 106* 96* 75* 70* 63*  CREATININE 4.59* 4.49*   < > 8.07* 5.92* 3.44* 2.88* 2.60*  CALCIUM  9.2 8.7*   < > 6.7* 6.9* 8.4* 8.0* 8.0*  MG  --  2.0  --   --  2.3  --  1.9  --   PHOS  --  5.4*  --   --  5.7*  --  3.8  --   ALBUMIN  4.2 3.8  --   --  3.2*  --  2.4*  --    < > = values in this interval not displayed.    Liver Function Tests: Recent Labs  Lab 08/06/23 2148 08/07/23 0501 08/10/23 0503 08/12/23 0449  AST 23 17  --   --   ALT 20 17  --   --   ALKPHOS 100 86  --   --   BILITOT 0.8 1.0  --   --   PROT 8.1 7.1  --   --   ALBUMIN  4.2 3.8 3.2* 2.4*   Recent Labs  Lab 08/06/23 2148  LIPASE 31   No results for input(s): "AMMONIA" in the last 168 hours. CBC: Recent  Labs    05/05/23 0830 08/06/23 2148 08/08/23 0515 08/09/23 0557 08/10/23 0513 08/11/23 0510 08/12/23 0449  HGB 10.6*   < > 12.3 12.3 11.3* 9.7* 10.1*  MCV 95   < > 98.0 98.9 102.6* 104.3* 103.7*  VITAMINB12 854  --   --   --   --   --   --   FOLATE 9.1  --   --   --   --   --   --   FERRITIN 251*  --   --   --   --   --   --   TIBC 290  --   --   --   --   --   --   IRON 61  --   --   --   --   --   --    < > = values in this interval not displayed.    Cardiac Enzymes: No results for input(s): "CKTOTAL", "CKMB", "CKMBINDEX", "TROPONINI" in the last 168 hours. CBG: Recent Labs  Lab 08/10/23 0451 08/13/23 0605  GLUCAP 118* 111*    Iron Studies: No results for input(s): "IRON", "TIBC", "TRANSFERRIN", "FERRITIN" in the last 72 hours. Studies/Results: CT CHEST ABDOMEN PELVIS WO CONTRAST Result Date: 08/12/2023 CLINICAL DATA:  Sepsis.  Small-bowel obstruction. EXAM: CT CHEST, ABDOMEN AND PELVIS WITHOUT CONTRAST TECHNIQUE: Multidetector CT imaging of the chest, abdomen and pelvis was performed following the standard protocol without IV contrast. RADIATION DOSE REDUCTION: This exam was performed according to the  departmental dose-optimization program which includes automated exposure control, adjustment of the mA and/or kV according to patient size and/or use of iterative reconstruction technique. COMPARISON:  Aug 06, 2023. FINDINGS: CT CHEST FINDINGS Cardiovascular: Status post stent graft repair of thoracic aorta. Mild cardiomegaly. No pericardial effusion. Mediastinum/Nodes: Thyroid gland is unremarkable. Distal tip of feeding tube terminates in distal esophagus. No adenopathy. Lungs/Pleura: No pneumothorax is noted. Minimal bibasilar subsegmental atelectasis is noted with probable minimal pleural effusions. Musculoskeletal: No chest wall mass or suspicious bone lesions identified. CT ABDOMEN PELVIS FINDINGS Hepatobiliary: No cholelithiasis or biliary dilatation. Liver is unremarkable on these unenhanced images. Pancreas: Unremarkable. No pancreatic ductal dilatation or surrounding inflammatory changes. Spleen: Normal in size without focal abnormality. Adrenals/Urinary Tract: Adrenal glands are unremarkable. Kidneys are normal, without renal calculi, focal lesion, or hydronephrosis. Bladder is unremarkable. Stomach/Bowel: Stomach is unremarkable. Loop ileostomy is again noted in right lower quadrant. There is a large associated peristomal hernia which contains loops of small bowel, and appears to be resulting in severe obstruction of more proximal small bowel loops. Dilated small bowel loops are also noted within the peristomal hernia itself. Another smaller ventral hernia is noted in central portion of abdomen and periumbilical region with small bowel loops within it, also potentially causing obstruction. No definite colonic dilatation is noted. Vascular/Lymphatic: Aortic atherosclerosis. 5.2 cm infrarenal abdominal aortic aneurysm is noted. No enlarged abdominal or pelvic lymph nodes. Reproductive: Uterus and bilateral adnexa are unremarkable. Other: Minimal free fluid is noted in the pelvis. Musculoskeletal: No acute  or significant osseous findings. IMPRESSION: There is again noted severe small bowel obstruction due to small bowel loops being incarcerated within 2 ventral hernias, including the large peristomal hernia involving loop ileostomy in right lower quadrant, as well as the large periumbilical hernia. 5.2 cm infrarenal abdominal aortic aneurysm. Recommend follow-up CT or MR as appropriate in 6 months and referral to or continued care with vascular specialist. (Ref.: J Vasc Surg. 2018; 67:2-77 and J  Am Coll Radiol 2013;10(10):789-794.) Minimal bibasilar subsegmental atelectasis with small pleural effusions. Aortic Atherosclerosis (ICD10-I70.0). Electronically Signed   By: Rosalene Colon M.D.   On: 08/12/2023 17:44   DG CHEST PORT 1 VIEW Result Date: 08/12/2023 CLINICAL DATA:  Catheter insertion EXAM: PORTABLE CHEST 1 VIEW COMPARISON:  Chest x-ray 08/10/2023 FINDINGS: Right upper extremity PICC terminates in the SVC. There is no pneumothorax. Enteric tube tip is in the distal esophagus 8 cm proximal from the diaphragm. This is unchanged. The cardiomediastinal silhouette is stable. The heart is enlarged. Aortic graft is present as well sternotomy wires. There are bands of atelectasis in the lower lungs. No new focal lung infiltrate, pleural effusion or pneumothorax. IMPRESSION: 1. Right upper extremity PICC terminates in the SVC. No pneumothorax. 2. Enteric tube tip is in the distal esophagus 8 cm proximal from the diaphragm. This is unchanged. Recommend advancement. Electronically Signed   By: Tyron Gallon M.D.   On: 08/12/2023 17:08   US  EKG SITE RITE Result Date: 08/12/2023 If Site Rite image not attached, placement could not be confirmed due to current cardiac rhythm.  ECHOCARDIOGRAM COMPLETE Result Date: 08/12/2023    ECHOCARDIOGRAM REPORT   Patient Name:   Dorothy Harris [facility] Date of Exam: 08/12/2023 Medical Rec #:  [id number]     Height:       67.0 in Accession #:    [id number]    Weight:       204.8 lb Date of Birth:   1960-12-28     BSA:          2.043 m Patient Age:    62 years      BP:           92/63 mmHg Patient Gender: F             HR:           107 bpm. Exam Location:  [facility] Procedure: 2D Echo, Cardiac Doppler, Color Doppler and Intracardiac            Opacification Agent (Both Spectral and Color Flow Doppler were            utilized during procedure). Indications:    Atrial Fibrillation  History:        Patient has prior history of Echocardiogram examinations.                 NSTEMI, Arrythmias:Atrial Fibrillation; Risk                 Factors:Hypertension.  Sonographer:    Willey Harrier Referring Phys: [id number] [facility]  Sonographer Comments: Technically difficult study due to poor echo windows. Image acquisition challenging due to patient body habitus. IMPRESSIONS  1. Left ventricular ejection fraction, by estimation, is 60 to 65%. The left ventricle has normal function. The left ventricle has no regional wall motion abnormalities. There is severe asymmetric left ventricular hypertrophy of the septal segment. Left  ventricular diastolic function could not be evaluated.  2. Right ventricular systolic function is normal. The right ventricular size is normal. There is normal pulmonary artery systolic pressure.  3. Left atrial size was severely dilated.  4. The mitral valve is normal in structure. Trivial mitral valve regurgitation. No evidence of mitral stenosis.  5. The aortic valve is tricuspid. There is moderate calcification of the aortic valve. Aortic valve regurgitation is not visualized. No aortic stenosis is present.  6. The inferior vena cava is normal in size with greater than 50% respiratory  variability, suggesting right atrial pressure of 3 mmHg. FINDINGS  Left Ventricle: Left ventricular ejection fraction, by estimation, is 60 to 65%. The left ventricle has normal function. The left ventricle has no regional wall motion abnormalities. Strain was performed and the global longitudinal strain is  indeterminate. The left ventricular internal cavity size was normal in size. There is severe asymmetric left ventricular hypertrophy of the septal segment. Left ventricular diastolic function could not be evaluated due to atrial fibrillation. Left ventricular diastolic function could not be evaluated. Right Ventricle: The right ventricular size is normal. No increase in right ventricular wall thickness. Right ventricular systolic function is normal. There is normal pulmonary artery systolic pressure. The tricuspid regurgitant velocity is 2.50 m/s, and  with an assumed right atrial pressure of 3 mmHg, the estimated right ventricular systolic pressure is 28.0 mmHg. Left Atrium: Left atrial size was severely dilated. Right Atrium: Right atrial size was normal in size. Pericardium: There is no evidence of pericardial effusion. Mitral Valve: The mitral valve is normal in structure. Trivial mitral valve regurgitation. No evidence of mitral valve stenosis. MV peak gradient, 5.0 mmHg. The mean mitral valve gradient is 2.3 mmHg. Tricuspid Valve: The tricuspid valve is normal in structure. Tricuspid valve regurgitation is trivial. No evidence of tricuspid stenosis. Aortic Valve: The aortic valve is tricuspid. There is moderate calcification of the aortic valve. Aortic valve regurgitation is not visualized. No aortic stenosis is present. Aortic valve peak gradient measures 7.7 mmHg. Pulmonic Valve: The pulmonic valve was normal in structure. Pulmonic valve regurgitation is not visualized. No evidence of pulmonic stenosis. Aorta: The aortic root and ascending aorta are structurally normal, with no evidence of dilitation. Venous: The inferior vena cava is normal in size with greater than 50% respiratory variability, suggesting right atrial pressure of 3 mmHg. IAS/Shunts: No atrial level shunt detected by color flow Doppler. Additional Comments: 3D was performed not requiring image post processing on an independent workstation  and was indeterminate.  LEFT VENTRICLE PLAX 2D LVIDd:         3.80 cm LVIDs:         2.40 cm LV PW:         1.30 cm LV IVS:        1.80 cm LVOT diam:     2.10 cm LV SV:         54 LV SV Index:   26 LVOT Area:     3.46 cm  IVC IVC diam: 1.70 cm LEFT ATRIUM              Index        RIGHT ATRIUM           Index LA diam:        4.80 cm  2.35 cm/m   RA Area:     11.10 cm LA Vol (A2C):   63.7 ml  31.18 ml/m  RA Volume:   21.70 ml  10.62 ml/m LA Vol (A4C):   112.0 ml 54.83 ml/m LA Biplane Vol: 86.9 ml  42.54 ml/m  AORTIC VALVE AV Area (Vmax): 2.66 cm AV Vmax:        138.67 cm/s AV Peak Grad:   7.7 mmHg LVOT Vmax:      106.50 cm/s LVOT Vmean:     66.950 cm/s LVOT VTI:       0.156 m  AORTA Ao Root diam: 2.80 cm Ao Asc diam:  3.20 cm MITRAL VALVE  TRICUSPID VALVE MV Area (PHT): 3.95 cm     TR Peak grad:   25.0 mmHg MV Area VTI:   3.18 cm     TR Vmax:        250.00 cm/s MV Peak grad:  5.0 mmHg MV Mean grad:  2.3 mmHg     SHUNTS MV Vmax:       1.12 m/s     Systemic VTI:  0.16 m MV Vmean:      61.9 cm/s    Systemic Diam: 2.10 cm MV Decel Time: 192 msec MV E velocity: 104.50 cm/s Vishnu Priya Mallipeddi Electronically signed by Lucetta Russel Mallipeddi Signature Date/Time: 08/12/2023/12:50:15 PM    Final     Medications: Infusions:  sodium chloride      amiodarone  30 mg/hr (08/13/23 0933)   lactated ringers  Stopped (08/13/23 0909)   norepinephrine  (LEVOPHED ) Adult infusion Stopped (08/13/23 0557)    Scheduled Medications:  Chlorhexidine  Gluconate Cloth  6 each Topical Daily   heparin  injection (subcutaneous)  5,000 Units Subcutaneous Q8H   pantoprazole  (PROTONIX ) IV  40 mg Intravenous Q12H   sodium chloride  flush  10-40 mL Intracatheter Q12H    have reviewed scheduled and prn medications.  Physical Exam: General:NAD, comfortable Heart:RRR, s1s2 nl Lungs:clear b/l, no crackle Abdomen:soft, Non-tender, non-distended Extremities:No edema Neurology: Alert, awake and following  commands.  Shelisha Gautier Prasad Azora Bonzo 08/13/2023,9:36 AM  LOS: 6 days

## 2023-08-13 NOTE — Progress Notes (Signed)
 [facility] Progress Note     Subjective: Patient with some output from the ileostomy but minimal. CT last night with continued obstruction but no concerning features like thickening or perforation. NG advanced as it was shallow. NG output is going down. Patient is high risk for surgery based on cardiology's evaluation.  No plans for surgery given the risk from a cardiac standpoint. Will exhaust non-operative measures.   Objective: Vital signs in last 24 hours: Temp:  [98.1 F (36.7 C)-99.1 F (37.3 C)] 99.1 F (37.3 C) (05/08 0822) Pulse Rate:  [82-131] 96 (05/08 1115) Resp:  [13-24] 15 (05/08 1115) BP: (85-136)/(47-78) 115/62 (05/08 1115) SpO2:  [94 %-100 %] 97 % (05/08 1115) Weight:  [94.2 kg] 94.2 kg (05/08 0429) Last BM Date : 08/12/23  Intake/Output from previous day: 05/07 0701 - 05/08 0700 In: 656.7 [I.V.:656.7] Out: 2850 [Urine:1500; Emesis/NG output:1200; Stool:150] Intake/Output this shift: Total I/O In: 410.6 [I.V.:410.6] Out: 0   General appearance: alert and no distress Resp: normal work of breathing GI: soft, reducible hernia, nontender, bowel sounds present, ileostomy with prolapse, reducible  Lab Results:  Recent Labs    08/11/23 0510 08/12/23 0449  WBC 13.3* 16.2*  HGB 9.7* 10.1*  HCT 31.5* 33.6*  PLT 253 250   BMET Recent Labs    08/12/23 0449 08/13/23 0822  NA 143 141  K 3.7 3.3*  CL 99 99  CO2 28 28  GLUCOSE 107* 126*  BUN 70* 63*  CREATININE 2.88* 2.60*  CALCIUM  8.0* 8.0*   PT/INR No results for input(s): "LABPROT", "INR" in the last 72 hours.  Studies/Results: CT CHEST ABDOMEN PELVIS WO CONTRAST Result Date: 08/12/2023 CLINICAL DATA:  Sepsis.  Small-bowel obstruction. EXAM: CT CHEST, ABDOMEN AND PELVIS WITHOUT CONTRAST TECHNIQUE: Multidetector CT imaging of the chest, abdomen and pelvis was performed following the standard protocol without IV contrast. RADIATION DOSE REDUCTION: This exam was performed according to  the departmental dose-optimization program which includes automated exposure control, adjustment of the mA and/or kV according to patient size and/or use of iterative reconstruction technique. COMPARISON:  Aug 06, 2023. FINDINGS: CT CHEST FINDINGS Cardiovascular: Status post stent graft repair of thoracic aorta. Mild cardiomegaly. No pericardial effusion. Mediastinum/Nodes: Thyroid gland is unremarkable. Distal tip of feeding tube terminates in distal esophagus. No adenopathy. Lungs/Pleura: No pneumothorax is noted. Minimal bibasilar subsegmental atelectasis is noted with probable minimal pleural effusions. Musculoskeletal: No chest wall mass or suspicious bone lesions identified. CT ABDOMEN PELVIS FINDINGS Hepatobiliary: No cholelithiasis or biliary dilatation. Liver is unremarkable on these unenhanced images. Pancreas: Unremarkable. No pancreatic ductal dilatation or surrounding inflammatory changes. Spleen: Normal in size without focal abnormality. Adrenals/Urinary Tract: Adrenal glands are unremarkable. Kidneys are normal, without renal calculi, focal lesion, or hydronephrosis. Bladder is unremarkable. Stomach/Bowel: Stomach is unremarkable. Loop ileostomy is again noted in right lower quadrant. There is a large associated peristomal hernia which contains loops of small bowel, and appears to be resulting in severe obstruction of more proximal small bowel loops. Dilated small bowel loops are also noted within the peristomal hernia itself. Another smaller ventral hernia is noted in central portion of abdomen and periumbilical region with small bowel loops within it, also potentially causing obstruction. No definite colonic dilatation is noted. Vascular/Lymphatic: Aortic atherosclerosis. 5.2 cm infrarenal abdominal aortic aneurysm is noted. No enlarged abdominal or pelvic lymph nodes. Reproductive: Uterus and bilateral adnexa are unremarkable. Other: Minimal free fluid is noted in the pelvis. Musculoskeletal: No  acute or significant osseous findings. IMPRESSION: There is  again noted severe small bowel obstruction due to small bowel loops being incarcerated within 2 ventral hernias, including the large peristomal hernia involving loop ileostomy in right lower quadrant, as well as the large periumbilical hernia. 5.2 cm infrarenal abdominal aortic aneurysm. Recommend follow-up CT or MR as appropriate in 6 months and referral to or continued care with vascular specialist. (Ref.: J Vasc Surg. 2018; 67:2-77 and J Am Coll Radiol 2013;10(10):789-794.) Minimal bibasilar subsegmental atelectasis with small pleural effusions. Aortic Atherosclerosis (ICD10-I70.0). Electronically Signed   By: Rosalene Colon M.D.   On: 08/12/2023 17:44   DG CHEST PORT 1 VIEW Result Date: 08/12/2023 CLINICAL DATA:  Catheter insertion EXAM: PORTABLE CHEST 1 VIEW COMPARISON:  Chest x-ray 08/10/2023 FINDINGS: Right upper extremity PICC terminates in the SVC. There is no pneumothorax. Enteric tube tip is in the distal esophagus 8 cm proximal from the diaphragm. This is unchanged. The cardiomediastinal silhouette is stable. The heart is enlarged. Aortic graft is present as well sternotomy wires. There are bands of atelectasis in the lower lungs. No new focal lung infiltrate, pleural effusion or pneumothorax. IMPRESSION: 1. Right upper extremity PICC terminates in the SVC. No pneumothorax. 2. Enteric tube tip is in the distal esophagus 8 cm proximal from the diaphragm. This is unchanged. Recommend advancement. Electronically Signed   By: Tyron Gallon M.D.   On: 08/12/2023 17:08   US  EKG SITE RITE Result Date: 08/12/2023 If Site Rite image not attached, placement could not be confirmed due to current cardiac rhythm.  ECHOCARDIOGRAM COMPLETE Result Date: 08/12/2023    ECHOCARDIOGRAM REPORT   Patient Name:   Dorothy Harris [facility] Date of Exam: 08/12/2023 Medical Rec #:  [id number]     Height:       67.0 in Accession #:    [id number]    Weight:       204.8 lb Date of  Birth:  [birth year]     BSA:          2.043 m Patient Age:    63 years      BP:           92/63 mmHg Patient Gender: F             HR:           107 bpm. Exam Location:  [facility] Procedure: 2D Echo, Cardiac Doppler, Color Doppler and Intracardiac            Opacification Agent (Both Spectral and Color Flow Doppler were            utilized during procedure). Indications:    Atrial Fibrillation  History:        Patient has prior history of Echocardiogram examinations.                 NSTEMI, Arrythmias:Atrial Fibrillation; Risk                 Factors:Hypertension.  Sonographer:    Willey Harrier Referring Phys: [id number] [facility] [place]  Sonographer Comments: Technically difficult study due to poor echo windows. Image acquisition challenging due to patient body habitus. IMPRESSIONS  1. Left ventricular ejection fraction, by estimation, is 60 to 65%. The left ventricle has normal function. The left ventricle has no regional wall motion abnormalities. There is severe asymmetric left ventricular hypertrophy of the septal segment. Left  ventricular diastolic function could not be evaluated.  2. Right ventricular systolic function is normal. The right ventricular size is normal. There is normal pulmonary artery  systolic pressure.  3. Left atrial size was severely dilated.  4. The mitral valve is normal in structure. Trivial mitral valve regurgitation. No evidence of mitral stenosis.  5. The aortic valve is tricuspid. There is moderate calcification of the aortic valve. Aortic valve regurgitation is not visualized. No aortic stenosis is present.  6. The inferior vena cava is normal in size with greater than 50% respiratory variability, suggesting right atrial pressure of 3 mmHg. FINDINGS  Left Ventricle: Left ventricular ejection fraction, by estimation, is 60 to 65%. The left ventricle has normal function. The left ventricle has no regional wall motion abnormalities. Strain was performed and the global longitudinal strain is  indeterminate. The left ventricular internal cavity size was normal in size. There is severe asymmetric left ventricular hypertrophy of the septal segment. Left ventricular diastolic function could not be evaluated due to atrial fibrillation. Left ventricular diastolic function could not be evaluated. Right Ventricle: The right ventricular size is normal. No increase in right ventricular wall thickness. Right ventricular systolic function is normal. There is normal pulmonary artery systolic pressure. The tricuspid regurgitant velocity is 2.50 m/s, and  with an assumed right atrial pressure of 3 mmHg, the estimated right ventricular systolic pressure is 28.0 mmHg. Left Atrium: Left atrial size was severely dilated. Right Atrium: Right atrial size was normal in size. Pericardium: There is no evidence of pericardial effusion. Mitral Valve: The mitral valve is normal in structure. Trivial mitral valve regurgitation. No evidence of mitral valve stenosis. MV peak gradient, 5.0 mmHg. The mean mitral valve gradient is 2.3 mmHg. Tricuspid Valve: The tricuspid valve is normal in structure. Tricuspid valve regurgitation is trivial. No evidence of tricuspid stenosis. Aortic Valve: The aortic valve is tricuspid. There is moderate calcification of the aortic valve. Aortic valve regurgitation is not visualized. No aortic stenosis is present. Aortic valve peak gradient measures 7.7 mmHg. Pulmonic Valve: The pulmonic valve was normal in structure. Pulmonic valve regurgitation is not visualized. No evidence of pulmonic stenosis. Aorta: The aortic root and ascending aorta are structurally normal, with no evidence of dilitation. Venous: The inferior vena cava is normal in size with greater than 50% respiratory variability, suggesting right atrial pressure of 3 mmHg. IAS/Shunts: No atrial level shunt detected by color flow Doppler. Additional Comments: 3D was performed not requiring image post processing on an independent workstation  and was indeterminate.  LEFT VENTRICLE PLAX 2D LVIDd:         3.80 cm LVIDs:         2.40 cm LV PW:         1.30 cm LV IVS:        1.80 cm LVOT diam:     2.10 cm LV SV:         54 LV SV Index:   26 LVOT Area:     3.46 cm  IVC IVC diam: 1.70 cm LEFT ATRIUM              Index        RIGHT ATRIUM           Index LA diam:        4.80 cm  2.35 cm/m   RA Area:     11.10 cm LA Vol (A2C):   63.7 ml  31.18 ml/m  RA Volume:   21.70 ml  10.62 ml/m LA Vol (A4C):   112.0 ml 54.83 ml/m LA Biplane Vol: 86.9 ml  42.54 ml/m  AORTIC VALVE AV Area (Vmax): 2.66  cm AV Vmax:        138.67 cm/s AV Peak Grad:   7.7 mmHg LVOT Vmax:      106.50 cm/s LVOT Vmean:     66.950 cm/s LVOT VTI:       0.156 m  AORTA Ao Root diam: 2.80 cm Ao Asc diam:  3.20 cm MITRAL VALVE                TRICUSPID VALVE MV Area (PHT): 3.95 cm     TR Peak grad:   25.0 mmHg MV Area VTI:   3.18 cm     TR Vmax:        250.00 cm/s MV Peak grad:  5.0 mmHg MV Mean grad:  2.3 mmHg     SHUNTS MV Vmax:       1.12 m/s     Systemic VTI:  0.16 m MV Vmean:      61.9 cm/s    Systemic Diam: 2.10 cm MV Decel Time: 192 msec MV E velocity: 104.50 cm/s Vishnu Priya Mallipeddi Electronically signed by Lucetta Russel Mallipeddi Signature Date/Time: 08/12/2023/12:50:15 PM    Final     Anti-infectives: Anti-infectives (From admission, onward)    None       Assessment/Plan: Patient with SBO and high risk. Will avoid surgery for now. I had tentatively put patient on the schedule but with the high risk I am not planning on any surgery right now.   NPO, NG TPN, Picc in place now  Discussed with patient and explained high risk and wanting to avoid surgery.   LOS: 6 days    Awilda Bogus 08/13/2023

## 2023-08-13 NOTE — Progress Notes (Signed)
 Initial Nutrition Assessment  DOCUMENTATION CODES:   Not applicable  INTERVENTION:   TPN to meet 100% of estimated nutrition needs as able.  NUTRITION DIAGNOSIS:   Inadequate oral intake related to altered GI function as evidenced by NPO status.  GOAL:   Patient will meet greater than or equal to 90% of their needs  MONITOR:   Diet advancement, I & O's  REASON FOR ASSESSMENT:   Consult New TPN/TNA  ASSESSMENT:   63 yo female admitted with SBO associated with ventral hernia, AKI. 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  Patient reports 3 days of N/V PTA. Now NPO x 6 days since admission. Patient is at increased nutrition risk. Prior to this episode patient was eating well and weight was stable. Plans to begin TPN via PICC today. Plans for SBO repair in OR 5/9.  NG tube in place to suction with 1200 ml output x 24 hours. Patient is being allowed 2 cans of Gingerale per day as well as ice chips.  Ileostomy output 150 ml x 24 hours  Labs reviewed. K 3.3 CBG: 111  Medications reviewed and include protonix . Received IV KCl x 2 runs this morning for repletion.   Weight history reviewed. No significant weight loss noted over the past year.  Admit weight 89 kg Lowest weight this adm 85.6 kg Current weight 94.2 kg  NUTRITION - FOCUSED PHYSICAL EXAM:  Flowsheet Row Most Recent Value  Orbital Region No depletion  Upper Arm Region No depletion  Thoracic and Lumbar Region No depletion  Buccal Region No depletion  Temple Region No depletion  Clavicle Bone Region Mild depletion  Clavicle and Acromion Bone Region No depletion  Scapular Bone Region Unable to assess  Dorsal Hand No depletion  Patellar Region No depletion  Anterior Thigh Region No depletion  Posterior Calf Region Mild depletion  Edema (RD Assessment) None  Hair Reviewed  Eyes Reviewed  Mouth Reviewed  Skin Reviewed  Nails Reviewed        Diet Order:   Diet Order             Diet NPO time specified Except for: Ice Chips  Diet effective now                   EDUCATION NEEDS:   No education needs have been identified at this time  Skin:  Skin Assessment: Reviewed RN Assessment  Last BM:  5/7 type 7, ileostomy  Height:   Ht Readings from Last 1 Encounters:  08/12/23 5\' 7"  (1.702 m)    Weight:   Wt Readings from Last 1 Encounters:  08/13/23 94.2 kg    Ideal Body Weight:  61.4 kg  BMI:  Body mass index is 32.53 kg/m.  Estimated Nutritional Needs:   Kcal:  1900-2100  Protein:  110-130 gm  Fluid:  1.9-2.1 L   Barnet Boots RD, LDN, CNSC Contact via secure chat. If unavailable, use group chat "RD Inpatient."

## 2023-08-13 NOTE — Progress Notes (Addendum)
 Progress Note  Patient Name: Dorothy Harris Date of Encounter: 08/13/2023  Primary Cardiologist: Gaylyn Keas, MD  Subjective   No events overnight.  CT chest abdomen Elvis without contrast is unchanged from prior imaging studies.  Inpatient Medications    Scheduled Meds:  Chlorhexidine  Gluconate Cloth  6 each Topical Daily   heparin  injection (subcutaneous)  5,000 Units Subcutaneous Q8H   pantoprazole  (PROTONIX ) IV  40 mg Intravenous Q12H   sodium chloride  flush  10-40 mL Intracatheter Q12H   Continuous Infusions:  sodium chloride      amiodarone  30 mg/hr (08/13/23 0933)   lactated ringers  Stopped (08/13/23 0909)   norepinephrine  (LEVOPHED ) Adult infusion Stopped (08/13/23 0557)   potassium chloride      PRN Meds: acetaminophen  **OR** acetaminophen , hydrALAZINE , HYDROmorphone  (DILAUDID ) injection, phenol, prochlorperazine , sodium chloride  flush   Vital Signs    Vitals:   08/13/23 0730 08/13/23 0745 08/13/23 0822 08/13/23 0900  BP: (!) 119/55 118/73  109/68  Pulse: (!) 119 (!) 102  (!) 107  Resp: 16 17  20   Temp:   99.1 F (37.3 C)   TempSrc:   Oral   SpO2: 95% 96%  96%  Weight:      Height:        Intake/Output Summary (Last 24 hours) at 08/13/2023 0947 Last data filed at 08/13/2023 0910 Gross per 24 hour  Intake 1067.29 ml  Output 2050 ml  Net -982.71 ml   Filed Weights   08/12/23 0604 08/12/23 0854 08/13/23 0429  Weight: 98.3 kg 92.9 kg 94.2 kg    Telemetry     Personally reviewed.  A-fib with RVR.  ECG    Not performed today.  Physical Exam   GEN: No acute distress.   Neck: No JVD. Cardiac: Irregular rate and rhythm, no murmur, rub, or gallop.  Respiratory: Nonlabored. Clear to auscultation bilaterally. GI: Soft, nontender, bowel sounds present. MS: No edema; No deformity. Neuro:  Nonfocal. Psych: Alert and oriented x 3. Normal affect.  Labs    Chemistry Recent Labs  Lab 08/06/23 2148 08/07/23 0501 08/08/23 0515 08/10/23 0503  08/11/23 0510 08/12/23 0449 08/13/23 0822  NA 134* 135   < > 147* 144 143 141  K 4.0 3.8   < > 3.0* 3.3* 3.7 3.3*  CL 96* 96*   < > 92* 99 99 99  CO2 19* 24   < > 34* 32 28 28  GLUCOSE 189* 139*   < > 122* 104* 107* 126*  BUN 57* 58*   < > 96* 75* 70* 63*  CREATININE 4.59* 4.49*   < > 5.92* 3.44* 2.88* 2.60*  CALCIUM  9.2 8.7*   < > 6.9* 8.4* 8.0* 8.0*  PROT 8.1 7.1  --   --   --   --   --   ALBUMIN  4.2 3.8  --  3.2*  --  2.4*  --   AST 23 17  --   --   --   --   --   ALT 20 17  --   --   --   --   --   ALKPHOS 100 86  --   --   --   --   --   BILITOT 0.8 1.0  --   --   --   --   --   GFRNONAA 10* 11*   < > 8* 14* 18* 20*  ANIONGAP 19* 15   < > 21* 13 16* 14   < > =  values in this interval not displayed.     Hematology Recent Labs  Lab 08/10/23 0513 08/11/23 0510 08/12/23 0449  WBC 13.3* 13.3* 16.2*  RBC 3.52* 3.02* 3.24*  HGB 11.3* 9.7* 10.1*  HCT 36.1 31.5* 33.6*  MCV 102.6* 104.3* 103.7*  MCH 32.1 32.1 31.2  MCHC 31.3 30.8 30.1  RDW 14.9 15.1 15.1  PLT 305 253 250    Cardiac Enzymes Recent Labs  Lab 08/06/23 2148 08/06/23 2312 08/10/23 0513 08/10/23 0838  TROPONINIHS 93* 86* 214* 206*    BNPNo results for input(s): "BNP", "PROBNP" in the last 168 hours.   DDimerNo results for input(s): "DDIMER" in the last 168 hours.     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 Myles Arvin at [facility] vascular surgery, patient refused surgery due to risk of paraplegia)    -Atrial fibrillation rates are controlled after starting amiodarone  drip.  Okay to continue the amiodarone  drip during the surgery for better rate control.  Risk outweigh benefits for systemic AC at this time, we will revisit the conversation after the surgery.  Echocardiogram obtained yesterday showed normal LVEF, severe left ventricle hypertrophy of the septal segment, no valvular heart disease, CVP 3 mmHg.  Do not drop preload and maintain afterload for the surgery due to presence of severe LVH.  She has pains in her chest only when she swallows, these are noncardiac and likely from NG tube (defer this to primary team).  Overall, high risk for any perioperative cardiac complications due to multiple comorbidities.  She is scheduled for SBO repair/surgery on 08/14/2023. -She has a history of ascending aorta graft repair (due to type a aortic dissection in 2022) with reimplantation of brachiocephalic vessels and later on, underwent stent grafting of the arch and descending thoracic aorta due to new aneurysmal changes distal to the repaired aortic arch.  She developed type II endoleak (not new) into the proximal descending thoracic aneurysm which measured 5.7 cm and infrarenal AAA 5.2 cm according to June 2024 imaging for which she was referred to [facility] vascular surgery for repair. Due to risk of paraplegia if she undergoes endoleak repair, patient refused surgery and preferred outpatient surveillance.   CRITICAL CARE Performed by: Kennyth Pean Robinette Esters   Total critical care time: 30 minutes  Critical care time was exclusive of separately billable procedures and treating other patients.  Critical care  was necessary to treat or prevent imminent or life-threatening deterioration.  Critical care was time spent personally by me on the following activities: development of treatment plan with patient and/or surrogate as well as nursing, discussions with consultants, evaluation of patient's response to treatment, examination of patient, obtaining history from patient or surrogate, ordering and performing treatments and interventions, ordering and review of laboratory studies, ordering and review of radiographic studies,  pulse oximetry and re-evaluation of patient's condition.   Signed, Lasalle Pointer, MD  08/13/2023, 9:47 AM

## 2023-08-13 NOTE — Plan of Care (Signed)

## 2023-08-13 NOTE — Progress Notes (Signed)
 PHARMACY - TOTAL PARENTERAL NUTRITION CONSULT NOTE   Indication: Small bowel obstruction  Patient Measurements: Height: 5\' 7"  (170.2 cm) Weight: 94.2 kg (207 lb 10.8 oz) IBW/kg (Calculated) : 61.6 TPN AdjBW (KG): 69.4 Body mass index is 32.53 kg/m. Usual Weight:   Assessment:  Small bowel obstruction with ventral hernia and end ileostomy.  Glucose / Insulin : 111-126 Electrolytes: K 3.3 Corrected calcium  9.3 Renal: Scr 2.6 Hepatic: pending Intake / Output; MIVF: LR @ 125 mL/hr GI Imaging/procedures:   Central access: PICC TPN start date:  5/8  Nutritional Goals: Goal TPN rate is 80 mL/hr (provides 115 g of protein and 1920 kcals per day)  RD Assessment: Estimated Needs Total Energy Estimated Needs: 1900-2100 Total Protein Estimated Needs: 110-130 gm Total Fluid Estimated Needs: 1.9-2.1 L  Current Nutrition:  NPO  Plan:  Start TPN at 40 mL/hr at 1800 Electrolytes in TPN: Na 60mEq/L, K 50mEq/L, Ca 31mEq/L, Mg 66mEq/L, and Phos 10mmol/L. Cl:Ac 1:1 Add standard MVI and trace elements to TPN Initiate Sensitive q8h SSI and adjust as needed  Reduce MIVF to 85 mL/hr at 1800. Stops in AM Monitor TPN labs on Mon/Thurs,   Cliffton Dama, PharmD Clinical Pharmacist 08/13/2023 11:53 AM

## 2023-08-13 NOTE — Progress Notes (Signed)
 PROGRESS NOTE  Dorothy Harris, is a 63 y.o. 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 - General Surgery consult from Dr. Collene Dawson appreciated --- the hernia appears to be reducible per general surgeon Continue NGT decompression - Continue IV fluids while NPO - If patient fails conservative approach--general surgeon believes that ideally patient should have a colonoscopy prior to surgical intervention with lysis of adhesions and possible reversal of ileostomy -Per general surgery team there is a possibility of surgical intervention; if patient failed ongoing conservative management (last option given high risk). -TPN will be started. -continue NPO status, follow electrolytes and replete as needed -continue to maintain adequate hydration -patient is high risk for any surgical intervention.  2) AKI----acute kidney injury on CKD stage IV -due to dehydration/volume depletion in a patient with SBO and n.p.o. status with high output from NG tube -creatinine on admission= 4.59,  baseline creatinine = 2.0 to 2.2  ,  -creatinine peaked at 8.07 (on 08/09/23) -Continue trending down---currently 2.60 with  ongoing hydration - Nephrology consultation appreciated - No metabolic acidosis and no hyperkalemia at this time, elevated anion gap noted - patient is volume depleted, -continue to maintain adequate hydration, follow nephrology rec's -Renally adjust medications, avoid nephrotoxic agents / hypotension  Intake/Output Summary (Last 24 hours) at 08/13/2023 1856 Last data filed at 08/13/2023 1846 Gross per 24 hour  Intake 2587.52 ml  Output 2625 ml  Net -37.48 ml    3) atypical chest pains with  elevated Troponin: -EKG nonacute suspect demand ischemia due to AKI and  bowel obstruction.  Troponin 214 >>206-- echo requested - Lower extremity Dopplers and VQ scan negative for DVT and PE respectively 08/11/23 - Patient remains at high risk for any surgical intervention. - Continue rate control with the use of IV amiodarone  - No ACS concerns per cardiology recommendations; 2D echo reassuring and demonstrating preserved ejection fraction. -holding B-blocker at the moment.   4)Essential hypertension: BP on the soft sides and will require pressors.  - c/n to Hold PTA amlodipine  and oral hydralazine   - Continue to follow VS   5)Paroxysmal A-fib: -PTA amiodarone  and Coreg  on hold due to n.p.o. status -Patient was not on anticoagulation PTA -follow echo results - Continue IV amiodarone  drip.   6)HFpEF w/ tte 8/24-EF 65 to 70%, G1DD: Currently volume depleted needing IV fluids due to SBO.   - Be judicious with IV fluids - follow Echo results -continue to follow I's and O's  7)HLD - Continue to hold PTA atorvastatin  while n.p.o.  8)Hypokalemia -Due to GI losses - Continue to follow trend and replete as needed. -Magnesium  WNL - Keep potassium around 4 especially in view of arrhythmias. -plan is for Mg around 2.  9)Hypernatremia--- due to dehydration,  - Creatinine is down to 144 from 147 continue fluids as ordered  10)acute urinary retention -in and out cath X 3 without success of  voiding spontaneously; foley catheter placed - Creatinine has further improved and back to her baseline - No suprapubic tenderness reported.  11)leukocytosis and abd pain - Continue to hold antibiotics at the moment - CT chest no demonstrating perforation or abscesses; images with ongoing consistent SBO pattern. - Continue to follow WBCs trend.    Status is: Inpatient   Subcu heparin  for DVT prophylaxis - Protonix  IV for GI prophylaxis  Disposition: The patient is from: Home              Anticipated d/c is to: Home              Anticipated d/c date is: > 3 days              Patient currently is not medically stable to d/c.  Barriers: Not Clinically Stable-   Code Status :  -  Code Status: Full Code   Family Communication:   (patient is alert, awake and coherent)  No family at bedside on today's evaluation.  DVT Prophylaxis  :   - SCDs   heparin  injection 5,000 Units Start: 08/10/23 1400 SCDs Start: 08/07/23 0437   Lab Results  Component Value Date   PLT 250 08/12/2023   Inpatient Medications  Scheduled Meds:  Chlorhexidine  Gluconate Cloth  6 each Topical Daily   heparin  injection (subcutaneous)  5,000 Units Subcutaneous Q8H   [START ON 08/14/2023] insulin  aspart  0-9 Units Subcutaneous Q8H   pantoprazole  (PROTONIX ) IV  40 mg Intravenous Q12H   sodium chloride  flush  10-40 mL Intracatheter Q12H   Continuous Infusions:  amiodarone  30 mg/hr (08/13/23 1846)   lactated ringers  85 mL/hr at 08/13/23 1846   norepinephrine  (LEVOPHED ) Adult infusion Stopped (08/13/23 0557)   TPN ADULT (ION) 40 mL/hr at 08/13/23 1846   PRN Meds:.acetaminophen  **OR** acetaminophen , hydrALAZINE , HYDROmorphone  (DILAUDID ) injection, phenol, prochlorperazine , sodium chloride  flush   Anti-infectives (From admission, onward)    None       Subjective: Dorothy Harris Foley catheter placed on 08/12/2023 with resolution of acute retention; renal function continued trending down.  At the moment rate  better control while receiving amiodarone  drip.  NG tube is still in place, patient reported some chest discomfort when swallowing.  Objective: Vitals:   08/13/23 1604 08/13/23 1605 08/13/23 1700 08/13/23 1800  BP:   118/76 (!) 131/53  Pulse:  95 (!) 116 (!) 103  Resp: 17  16 18   Temp:      TempSrc:      SpO2:   97% 97%  Weight:      Height:        Intake/Output Summary (Last 24 hours) at 08/13/2023 1856 Last data filed at 08/13/2023 1846 Gross per 24 hour  Intake 2587.52 ml  Output 2625 ml  Net -37.48 ml   Filed Weights   08/12/23 0604 08/12/23 0854 08/13/23 0429  Weight: 98.3 kg 92.9 kg 94.2 kg    Physical Exam General exam: Alert, awake, oriented x 3; NG tube in place; reports feeling slightly better.  No fever. Respiratory system: No using accessory muscles; no crackles, no wheezing. Cardiovascular system: Irregular, rate better controlled, no rubs, no gallops, no JVD. Gastrointestinal system: Abdomen is soft, positive bowel sounds were appreciated on exam; positive colostomy in place. Central nervous system: Moving 4 limbs spontaneously. No focal neurological  deficits. Extremities: No cyanosis or clubbing; trace edema appreciated bilaterally (affecting lower extremity and upper extremities). Skin: No petechiae. Psychiatry: Judgement and insight appear normal. Mood & affect appropriate.    Data Reviewed: I have personally reviewed following labs and imaging studies  CBC: Recent Labs  Lab 08/06/23 2148 08/07/23 0501 08/08/23 0515 08/09/23 0557 08/10/23 0513 08/11/23 0510 08/12/23 0449  WBC 8.0   < > 10.0 10.1 13.3* 13.3* 16.2*  NEUTROABS 5.0  --   --   --   --   --   --   HGB 13.0   < > 12.3 12.3 11.3* 9.7* 10.1*  HCT 40.3   < > 38.8 37.1 36.1 31.5* 33.6*  MCV 98.1   < > 98.0 98.9 102.6* 104.3* 103.7*  PLT 290   < > 319 310 305 253 250   < > = values in this interval not displayed.   Basic Metabolic Panel: Recent Labs  Lab 08/07/23 0501 08/08/23 0515  08/09/23 0557 08/10/23 0503 08/11/23 0510 08/12/23 0449 08/13/23 0822  NA 135   < > 140 147* 144 143 141  K 3.8   < > 3.3* 3.0* 3.3* 3.7 3.3*  CL 96*   < > 83* 92* 99 99 99  CO2 24   < > 31 34* 32 28 28  GLUCOSE 139*   < > 135* 122* 104* 107* 126*  BUN 58*   < > 106* 96* 75* 70* 63*  CREATININE 4.49*   < > 8.07* 5.92* 3.44* 2.88* 2.60*  CALCIUM  8.7*   < > 6.7* 6.9* 8.4* 8.0* 8.0*  MG 2.0  --   --  2.3  --  1.9  --   PHOS 5.4*  --   --  5.7*  --  3.8  --    < > = values in this interval not displayed.   GFR: Estimated Creatinine Clearance: 26.4 mL/min (A) (by C-G formula based on SCr of 2.6 mg/dL (H)).  Liver Function Tests: Recent Labs  Lab 08/06/23 2148 08/07/23 0501 08/10/23 0503 08/12/23 0449  AST 23 17  --   --   ALT 20 17  --   --   ALKPHOS 100 86  --   --   BILITOT 0.8 1.0  --   --   PROT 8.1 7.1  --   --   ALBUMIN  4.2 3.8 3.2* 2.4*   Radiology Studies: CT CHEST ABDOMEN PELVIS WO CONTRAST Result Date: 08/12/2023 CLINICAL DATA:  Sepsis.  Small-bowel obstruction. EXAM: CT CHEST, ABDOMEN AND PELVIS WITHOUT CONTRAST TECHNIQUE: Multidetector CT imaging of the chest, abdomen and pelvis was performed following the standard protocol without IV contrast. RADIATION DOSE REDUCTION: This exam was performed according to the departmental dose-optimization program which includes automated exposure control, adjustment of the mA and/or kV according to patient size and/or use of iterative reconstruction technique. COMPARISON:  Aug 06, 2023. FINDINGS: CT CHEST FINDINGS Cardiovascular: Status post stent graft repair of thoracic aorta. Mild cardiomegaly. No pericardial effusion. Mediastinum/Nodes: Thyroid gland is unremarkable. Distal tip of feeding tube terminates in distal esophagus. No adenopathy. Lungs/Pleura: No pneumothorax is noted. Minimal bibasilar subsegmental atelectasis is noted with probable minimal pleural effusions. Musculoskeletal: No chest wall mass or suspicious bone lesions  identified. CT ABDOMEN PELVIS FINDINGS Hepatobiliary: No cholelithiasis or biliary dilatation. Liver is unremarkable on these unenhanced images. Pancreas: Unremarkable. No pancreatic ductal dilatation or surrounding inflammatory changes. Spleen: Normal in size without focal abnormality. Adrenals/Urinary Tract: Adrenal glands are unremarkable. Kidneys are  normal, without renal calculi, focal lesion, or hydronephrosis. Bladder is unremarkable. Stomach/Bowel: Stomach is unremarkable. Loop ileostomy is again noted in right lower quadrant. There is a large associated peristomal hernia which contains loops of small bowel, and appears to be resulting in severe obstruction of more proximal small bowel loops. Dilated small bowel loops are also noted within the peristomal hernia itself. Another smaller ventral hernia is noted in central portion of abdomen and periumbilical region with small bowel loops within it, also potentially causing obstruction. No definite colonic dilatation is noted. Vascular/Lymphatic: Aortic atherosclerosis. 5.2 cm infrarenal abdominal aortic aneurysm is noted. No enlarged abdominal or pelvic lymph nodes. Reproductive: Uterus and bilateral adnexa are unremarkable. Other: Minimal free fluid is noted in the pelvis. Musculoskeletal: No acute or significant osseous findings. IMPRESSION: There is again noted severe small bowel obstruction due to small bowel loops being incarcerated within 2 ventral hernias, including the large peristomal hernia involving loop ileostomy in right lower quadrant, as well as the large periumbilical hernia. 5.2 cm infrarenal abdominal aortic aneurysm. Recommend follow-up CT or MR as appropriate in 6 months and referral to or continued care with vascular specialist. (Ref.: J Vasc Surg. 2018; 67:2-77 and J Am Coll Radiol 2013;10(10):789-794.) Minimal bibasilar subsegmental atelectasis with small pleural effusions. Aortic Atherosclerosis (ICD10-I70.0). Electronically Signed   By:  Rosalene Colon M.D.   On: 08/12/2023 17:44   DG CHEST PORT 1 VIEW Result Date: 08/12/2023 CLINICAL DATA:  Catheter insertion EXAM: PORTABLE CHEST 1 VIEW COMPARISON:  Chest x-ray 08/10/2023 FINDINGS: Right upper extremity PICC terminates in the SVC. There is no pneumothorax. Enteric tube tip is in the distal esophagus 8 cm proximal from the diaphragm. This is unchanged. The cardiomediastinal silhouette is stable. The heart is enlarged. Aortic graft is present as well sternotomy wires. There are bands of atelectasis in the lower lungs. No new focal lung infiltrate, pleural effusion or pneumothorax. IMPRESSION: 1. Right upper extremity PICC terminates in the SVC. No pneumothorax. 2. Enteric tube tip is in the distal esophagus 8 cm proximal from the diaphragm. This is unchanged. Recommend advancement. Electronically Signed   By: Tyron Gallon M.D.   On: 08/12/2023 17:08   US  EKG SITE RITE Result Date: 08/12/2023 If Site Rite image not attached, placement could not be confirmed due to current cardiac rhythm.  ECHOCARDIOGRAM COMPLETE Result Date: 08/12/2023    ECHOCARDIOGRAM REPORT   Patient Name:   NOLAN SANDEFER [facility] Date of Exam: 08/12/2023 Medical Rec #:  [id number]     Height:       67.0 in Accession #:    [id number]    Weight:       204.8 lb Date of Birth:  30-Aug-1960     BSA:          2.043 m Patient Age:    62 years      BP:           92/63 mmHg Patient Gender: F             HR:           107 bpm. Exam Location:  [facility] Procedure: 2D Echo, Cardiac Doppler, Color Doppler and Intracardiac            Opacification Agent (Both Spectral and Color Flow Doppler were            utilized during procedure). Indications:    Atrial Fibrillation  History:        Patient has prior history of  Echocardiogram examinations.                 NSTEMI, Arrythmias:Atrial Fibrillation; Risk                 Factors:Hypertension.  Sonographer:    Willey Harrier Referring Phys: [id number] [facility]  Sonographer Comments: Technically  difficult study due to poor echo windows. Image acquisition challenging due to patient body habitus. IMPRESSIONS  1. Left ventricular ejection fraction, by estimation, is 60 to 65%. The left ventricle has normal function. The left ventricle has no regional wall motion abnormalities. There is severe asymmetric left ventricular hypertrophy of the septal segment. Left  ventricular diastolic function could not be evaluated.  2. Right ventricular systolic function is normal. The right ventricular size is normal. There is normal pulmonary artery systolic pressure.  3. Left atrial size was severely dilated.  4. The mitral valve is normal in structure. Trivial mitral valve regurgitation. No evidence of mitral stenosis.  5. The aortic valve is tricuspid. There is moderate calcification of the aortic valve. Aortic valve regurgitation is not visualized. No aortic stenosis is present.  6. The inferior vena cava is normal in size with greater than 50% respiratory variability, suggesting right atrial pressure of 3 mmHg. FINDINGS  Left Ventricle: Left ventricular ejection fraction, by estimation, is 60 to 65%. The left ventricle has normal function. The left ventricle has no regional wall motion abnormalities. Strain was performed and the global longitudinal strain is indeterminate. The left ventricular internal cavity size was normal in size. There is severe asymmetric left ventricular hypertrophy of the septal segment. Left ventricular diastolic function could not be evaluated due to atrial fibrillation. Left ventricular diastolic function could not be evaluated. Right Ventricle: The right ventricular size is normal. No increase in right ventricular wall thickness. Right ventricular systolic function is normal. There is normal pulmonary artery systolic pressure. The tricuspid regurgitant velocity is 2.50 m/s, and  with an assumed right atrial pressure of 3 mmHg, the estimated right ventricular systolic pressure is 28.0 mmHg.  Left Atrium: Left atrial size was severely dilated. Right Atrium: Right atrial size was normal in size. Pericardium: There is no evidence of pericardial effusion. Mitral Valve: The mitral valve is normal in structure. Trivial mitral valve regurgitation. No evidence of mitral valve stenosis. MV peak gradient, 5.0 mmHg. The mean mitral valve gradient is 2.3 mmHg. Tricuspid Valve: The tricuspid valve is normal in structure. Tricuspid valve regurgitation is trivial. No evidence of tricuspid stenosis. Aortic Valve: The aortic valve is tricuspid. There is moderate calcification of the aortic valve. Aortic valve regurgitation is not visualized. No aortic stenosis is present. Aortic valve peak gradient measures 7.7 mmHg. Pulmonic Valve: The pulmonic valve was normal in structure. Pulmonic valve regurgitation is not visualized. No evidence of pulmonic stenosis. Aorta: The aortic root and ascending aorta are structurally normal, with no evidence of dilitation. Venous: The inferior vena cava is normal in size with greater than 50% respiratory variability, suggesting right atrial pressure of 3 mmHg. IAS/Shunts: No atrial level shunt detected by color flow Doppler. Additional Comments: 3D was performed not requiring image post processing on an independent workstation and was indeterminate.  LEFT VENTRICLE PLAX 2D LVIDd:         3.80 cm LVIDs:         2.40 cm LV PW:         1.30 cm LV IVS:        1.80 cm LVOT diam:  2.10 cm LV SV:         54 LV SV Index:   26 LVOT Area:     3.46 cm  IVC IVC diam: 1.70 cm LEFT ATRIUM              Index        RIGHT ATRIUM           Index LA diam:        4.80 cm  2.35 cm/m   RA Area:     11.10 cm LA Vol (A2C):   63.7 ml  31.18 ml/m  RA Volume:   21.70 ml  10.62 ml/m LA Vol (A4C):   112.0 ml 54.83 ml/m LA Biplane Vol: 86.9 ml  42.54 ml/m  AORTIC VALVE AV Area (Vmax): 2.66 cm AV Vmax:        138.67 cm/s AV Peak Grad:   7.7 mmHg LVOT Vmax:      106.50 cm/s LVOT Vmean:     66.950 cm/s LVOT  VTI:       0.156 m  AORTA Ao Root diam: 2.80 cm Ao Asc diam:  3.20 cm MITRAL VALVE                TRICUSPID VALVE MV Area (PHT): 3.95 cm     TR Peak grad:   25.0 mmHg MV Area VTI:   3.18 cm     TR Vmax:        250.00 cm/s MV Peak grad:  5.0 mmHg MV Mean grad:  2.3 mmHg     SHUNTS MV Vmax:       1.12 m/s     Systemic VTI:  0.16 m MV Vmean:      61.9 cm/s    Systemic Diam: 2.10 cm MV Decel Time: 192 msec MV E velocity: 104.50 cm/s Vishnu Priya Mallipeddi Electronically signed by Lucetta Russel Mallipeddi Signature Date/Time: 08/12/2023/12:50:15 PM    Final    Scheduled Meds:  Chlorhexidine  Gluconate Cloth  6 each Topical Daily   heparin  injection (subcutaneous)  5,000 Units Subcutaneous Q8H   [START ON 08/14/2023] insulin  aspart  0-9 Units Subcutaneous Q8H   pantoprazole  (PROTONIX ) IV  40 mg Intravenous Q12H   sodium chloride  flush  10-40 mL Intracatheter Q12H   Continuous Infusions:  amiodarone  30 mg/hr (08/13/23 1846)   lactated ringers  85 mL/hr at 08/13/23 1846   norepinephrine  (LEVOPHED ) Adult infusion Stopped (08/13/23 0557)   TPN ADULT (ION) 40 mL/hr at 08/13/23 1846    LOS: 6 days   Justina Oman M.D on 08/13/2023 at 6:56 PM  CRITICAL CARE Performed by: Justina Oman   Total critical care time: 55 minutes  Critical care time was exclusive of separately billable procedures and treating other patients.  Critical care was necessary to treat or prevent imminent or life-threatening deterioration.  Critical care was time spent personally by me on the following activities: development of treatment plan with patient and/or surrogate as well as nursing, discussions with consultants, evaluation of patient's response to treatment, examination of patient, obtaining history from patient or surrogate, ordering and performing treatments and interventions, ordering and review of laboratory studies, ordering and review of radiographic studies, pulse oximetry and re-evaluation of patient's condition.     Go to [email or web address] - for contact info  [facility] - Office  [phone]  If 7PM-7AM, please contact night-coverage [email or web address] 08/13/2023, 6:56 PM

## 2023-08-14 ENCOUNTER — Encounter (HOSPITAL_COMMUNITY)
Admission: EM | Disposition: A | Discharge status: Skilled Nursing Facility | Payer: Self-pay | Source: Home / Self Care | Attending: Internal Medicine

## 2023-08-14 DIAGNOSIS — R112 Nausea with vomiting, unspecified: Secondary | ICD-10-CM | POA: Diagnosis not present

## 2023-08-14 DIAGNOSIS — I10 Essential (primary) hypertension: Secondary | ICD-10-CM | POA: Diagnosis not present

## 2023-08-14 DIAGNOSIS — N179 Acute kidney failure, unspecified: Secondary | ICD-10-CM | POA: Diagnosis not present

## 2023-08-14 DIAGNOSIS — K435 Parastomal hernia without obstruction or  gangrene: Secondary | ICD-10-CM

## 2023-08-14 DIAGNOSIS — K56609 Unspecified intestinal obstruction, unspecified as to partial versus complete obstruction: Secondary | ICD-10-CM | POA: Diagnosis not present

## 2023-08-14 LAB — GLUCOSE, CAPILLARY
Glucose-Capillary: 168 mg/dL — ABNORMAL HIGH (ref 70–99)
Glucose-Capillary: 154 mg/dL — ABNORMAL HIGH (ref 70–99)
Glucose-Capillary: 172 mg/dL — ABNORMAL HIGH (ref 70–99)

## 2023-08-14 LAB — COMPREHENSIVE METABOLIC PANEL WITH GFR
ALT: 12 U/L (ref 0–44)
AST: 19 U/L (ref 15–41)
Albumin: 2.3 g/dL — ABNORMAL LOW (ref 3.5–5.0)
Alkaline Phosphatase: 61 U/L (ref 38–126)
Anion gap: 11 (ref 5–15)
BUN: 57 mg/dL — ABNORMAL HIGH (ref 8–23)
CO2: 30 mmol/L (ref 22–32)
Calcium: 8.4 mg/dL — ABNORMAL LOW (ref 8.9–10.3)
Chloride: 100 mmol/L (ref 98–111)
Creatinine, Ser: 2.27 mg/dL — ABNORMAL HIGH (ref 0.44–1.00)
GFR, Estimated: 24 mL/min — ABNORMAL LOW (ref >60)
Glucose, Bld: 166 mg/dL — ABNORMAL HIGH (ref 70–99)
Potassium: 3.5 mmol/L (ref 3.5–5.1)
Sodium: 141 mmol/L (ref 135–145)
Total Bilirubin: 0.5 mg/dL (ref 0.0–1.2)
Total Protein: 5.8 g/dL — ABNORMAL LOW (ref 6.5–8.1)

## 2023-08-14 LAB — MAGNESIUM: Magnesium: 1.9 mg/dL (ref 1.7–2.4)

## 2023-08-14 LAB — TRIGLYCERIDES: Triglycerides: 169 mg/dL — ABNORMAL HIGH (ref <150)

## 2023-08-14 LAB — PHOSPHORUS: Phosphorus: 3.2 mg/dL (ref 2.5–4.6)

## 2023-08-14 SURGERY — LAPAROTOMY, EXPLORATORY
Anesthesia: General

## 2023-08-14 MED ORDER — TRAVASOL 10 % IV SOLN
INTRAVENOUS | Status: AC
  Filled 2023-08-14: qty 1152

## 2023-08-14 MED ORDER — HYDROMORPHONE HCL 1 MG/ML IJ SOLN
1.0000 mg | INTRAMUSCULAR | Status: DC | PRN
  Administered 2023-08-14 – 2023-08-20 (×34): 1 mg via INTRAVENOUS
  Filled 2023-08-14 (×34): qty 1

## 2023-08-14 NOTE — Plan of Care (Signed)

## 2023-08-14 NOTE — Progress Notes (Signed)
   08/14/23 0533  Ileostomy RUQ  Placement Date/Time: 06/01/21 (c) 1615   Location: RUQ  Ostomy Pouch 2 piece  Stoma Assessment Pink;Prolapse  Peristomal Assessment Intact  Treatment Other (Comment)

## 2023-08-14 NOTE — Plan of Care (Signed)
   Progress Note  Patient Name: Dorothy Harris Date of Encounter: 08/14/2023  Primary Cardiologist: Gaylyn Keas, MD  Continue Amiodarone  drip for Afib with RVR control. Can switch to p.o amiodarone  200 mg once daily upon discharge or if HR consistently less than 100 bpm. Risks of initiation of systemic AC outweighs benefits at this time, will hold off until stable.  Signed, Lasalle Pointer, MD  08/14/2023, 8:53 AM

## 2023-08-14 NOTE — Progress Notes (Signed)
 PROGRESS NOTE  Dorothy Harris, is a 63 y.o. 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 - General Surgery consult from Dr. Collene Dawson appreciated --- the hernia appears to be reducible per general surgeon Continue NGT decompression - Continue IV fluids while NPO - If patient fails conservative approach--general surgeon believes that ideally patient should have a colonoscopy prior to surgical intervention with lysis of adhesions and possible reversal of ileostomy -Per general surgery team there is a possibility of surgical intervention; if patient failed ongoing conservative management (last option given high risk). -TPN will be started. -continue NPO status, follow electrolytes and replete as needed -continue to maintain adequate hydration -patient is high risk for any surgical intervention.  2) AKI----acute kidney injury on CKD stage IV -due to dehydration/volume depletion in a patient with SBO and n.p.o. status with high output from NG tube -creatinine on admission= 4.59,  baseline creatinine = 2.0 to 2.2  ,  -creatinine peaked at 8.07 (on 08/09/23) -Continue trending down---currently 2.60 with  ongoing hydration - Nephrology consultation appreciated - No metabolic acidosis and no hyperkalemia at this time, elevated anion gap noted - patient is volume depleted, -continue to maintain adequate hydration, follow nephrology rec's -Renally adjust medications, avoid nephrotoxic agents / hypotension  Intake/Output Summary (Last 24 hours) at 08/14/2023 1707 Last data filed at 08/14/2023 1600 Gross per 24 hour  Intake 2557.39 ml  Output 1400 ml  Net 1157.39 ml    3) atypical chest pains with  elevated Troponin: -EKG nonacute suspect demand ischemia due to AKI and  bowel obstruction.  Troponin 214 >>206-- echo requested - Lower extremity Dopplers and VQ scan negative for DVT and PE respectively 08/11/23 - Patient remains at high risk for any surgical intervention. - Continue rate control with the use of IV amiodarone  - No ACS concerns per cardiology recommendations; 2D echo reassuring and demonstrating preserved ejection fraction. -holding B-blocker at the moment.   4)Essential hypertension: BP on the soft sides and will require pressors.  - c/n to Hold PTA amlodipine  and oral hydralazine   - Continue to follow VS   5)Paroxysmal A-fib: -PTA amiodarone  and Coreg  on hold due to n.p.o. status -Patient was not on anticoagulation PTA -follow echo results - Continue IV amiodarone  drip.   6)HFpEF w/ tte 8/24-EF 65 to 70%, G1DD: Currently volume depleted needing IV fluids due to SBO.   - Be judicious with IV fluids - follow Echo results -continue to follow I's and O's  7)HLD - Continue to hold PTA atorvastatin  while n.p.o.  8)Hypokalemia -Due to GI losses - Continue to follow trend and replete as needed. -Magnesium  WNL - Keep potassium around 4 especially in view of arrhythmias. -plan is for Mg around 2.  9)Hypernatremia--- due to dehydration,  - Creatinine is down to 144 from 147 continue fluids as ordered  10)acute urinary retention -in and out cath X 3 without success of  voiding spontaneously; foley catheter placed and good urine output appreciated. - Creatinine has further improved and back to her baseline at the moment. - No suprapubic tenderness reported.  11)leukocytosis and abd pain - Continue to hold antibiotics at the moment - CT chest no demonstrating perforation or abscesses; images with ongoing consistent SBO pattern. - Continue to follow WBCs trend intermittently; patient is afebrile.    Status is: Inpatient   Subcu heparin  for DVT prophylaxis - Protonix  IV for GI prophylaxis  Disposition: The patient is from: Home              Anticipated d/c is to: Home              Anticipated d/c date is: > 3 days              Patient currently is not medically stable to d/c.  Barriers: Not Clinically Stable-   Code Status :  -  Code Status: Full Code   Family Communication:   (patient is alert, awake and coherent)  No family at bedside on today's evaluation.  DVT Prophylaxis  :   - SCDs   heparin  injection 5,000 Units Start: 08/10/23 1400 SCDs Start: 08/07/23 0437   Lab Results  Component Value Date   PLT 250 08/12/2023   Inpatient Medications  Scheduled Meds:  Chlorhexidine  Gluconate Cloth  6 each Topical Daily   heparin  injection (subcutaneous)  5,000 Units Subcutaneous Q8H   insulin  aspart  0-9 Units Subcutaneous Q8H   pantoprazole  (PROTONIX ) IV  40 mg Intravenous Q12H   sodium chloride  flush  10-40 mL Intracatheter Q12H   Continuous Infusions:  amiodarone  30 mg/hr (08/14/23 1131)   TPN ADULT (ION) 40 mL/hr at 08/14/23 1510   TPN ADULT (ION)     PRN Meds:.acetaminophen  **OR** acetaminophen , hydrALAZINE , HYDROmorphone  (DILAUDID ) injection, phenol, prochlorperazine , sodium chloride  flush   Anti-infectives (From admission, onward)    None       Subjective: Dorothy Harris afebrile, reports no nausea vomiting.  Still complaining of intermittent abdominal pain.  Decreased bowel sounds with some improvement in NG tube output.  Renal  function and urine output continue to improve.  Still experiencing intermittent episodes of of irregular irregular heart rate.  Objective: Vitals:   08/14/23 0800 08/14/23 1128 08/14/23 1607 08/14/23 1630  BP:      Pulse:    (!) 132  Resp:    17  Temp: 98.1 F (36.7 C) 98.7 F (37.1 C) 99.2 F (37.3 C)   TempSrc: Oral Oral Oral   SpO2:    97%  Weight:      Height:        Intake/Output Summary (Last 24 hours) at 08/14/2023 1707 Last data filed at 08/14/2023 1600 Gross per 24 hour  Intake 2557.39 ml  Output 1400 ml  Net 1157.39 ml   Filed Weights   08/12/23 0854 08/13/23 0429 08/14/23 0444  Weight: 92.9 kg 94.2 kg 95.4 kg    Physical Exam General exam: Alert, awake, oriented x 3; NG tube in place and reporting intermittent abdominal discomfort.  Some decrease in NG tube output appreciated.  Patient is afebrile. Respiratory system: Adequate saturation appreciated on exam; no wheezing or crackles. Cardiovascular system: Irregular irregular, no rubs, no gallops, no JVD on exam.  Amiodarone  drip in place. Gastrointestinal system:  Abdomen is soft, no guarding, decreased but present bowel sounds appreciated.  Ileostomy in place with little liquid material inside the bag; positive parastomal hernia reducible and with good appearance. Central nervous system: Moving 4 limbs spontaneously.  No focal neurological deficits. Extremities: No cyanosis or clubbing; trace edema appreciated bilaterally. Skin: No petechiae. Psychiatry: Judgement and insight appear normal.  Flat affect appreciated on exam.   Data Reviewed: I have personally reviewed following labs and imaging studies  CBC: Recent Labs  Lab 08/08/23 0515 08/09/23 0557 08/10/23 0513 08/11/23 0510 08/12/23 0449  WBC 10.0 10.1 13.3* 13.3* 16.2*  HGB 12.3 12.3 11.3* 9.7* 10.1*  HCT 38.8 37.1 36.1 31.5* 33.6*  MCV 98.0 98.9 102.6* 104.3* 103.7*  PLT 319 310 305 253 250   Basic Metabolic Panel: Recent Labs  Lab  08/10/23 0503 08/11/23 0510 08/12/23 0449 08/13/23 0822 08/14/23 0637  NA 147* 144 143 141 141  K 3.0* 3.3* 3.7 3.3* 3.5  CL 92* 99 99 99 100  CO2 34* 32 28 28 30   GLUCOSE 122* 104* 107* 126* 166*  BUN 96* 75* 70* 63* 57*  CREATININE 5.92* 3.44* 2.88* 2.60* 2.27*  CALCIUM  6.9* 8.4* 8.0* 8.0* 8.4*  MG 2.3  --  1.9  --  1.9  PHOS 5.7*  --  3.8  --  3.2   GFR: Estimated Creatinine Clearance: 30.5 mL/min (A) (by C-G formula based on SCr of 2.27 mg/dL (H)).  Liver Function Tests: Recent Labs  Lab 08/10/23 0503 08/12/23 0449 08/14/23 0637  AST  --   --  19  ALT  --   --  12  ALKPHOS  --   --  61  BILITOT  --   --  0.5  PROT  --   --  5.8*  ALBUMIN  3.2* 2.4* 2.3*   Radiology Studies: No results found.  Scheduled Meds:  Chlorhexidine  Gluconate Cloth  6 each Topical Daily   heparin  injection (subcutaneous)  5,000 Units Subcutaneous Q8H   insulin  aspart  0-9 Units Subcutaneous Q8H   pantoprazole  (PROTONIX ) IV  40 mg Intravenous Q12H   sodium chloride  flush  10-40 mL Intracatheter Q12H   Continuous Infusions:  amiodarone  30 mg/hr (08/14/23 1131)   TPN ADULT (ION) 40 mL/hr at 08/14/23 1510   TPN ADULT (ION)      LOS: 7 days   Justina Oman M.D on 08/14/2023 at 5:07 PM  CRITICAL CARE Performed by: Justina Oman   Total critical care time: 50 minutes  Critical care time was exclusive of separately billable procedures and treating other patients.  Critical care was necessary to treat or prevent imminent or life-threatening deterioration.  Critical care was time spent personally by me on the following activities: development of treatment plan with patient and/or surrogate as well as nursing, discussions with consultants, evaluation of patient's response to treatment, examination of patient, obtaining history from patient or surrogate, ordering and performing treatments and interventions, ordering and review of laboratory studies, ordering and review of radiographic  studies, pulse oximetry and re-evaluation of patient's condition.    Go to [email or web address] - for contact info  [facility] - Office  [phone]  If 7PM-7AM, please contact night-coverage [email or web address] 08/14/2023, 5:07 PM

## 2023-08-14 NOTE — Progress Notes (Signed)
 [facility] Progress Note     Subjective: Prolapse of the ileostomy noted on change of bag, this was known and as documented has been pink and I am able to reduce the prolapse. This is not the point of obstruction. Patient hernia is soft and reducible. She says her abdomen feels better. NG output is down and the ostomy has some documented output overnight.   AKI resolving. High risk for surgery given cardiac and vascular issues.   Objective: Vital signs in last 24 hours: Temp:  [97.9 F (36.6 C)-98 F (36.7 C)] 98 F (36.7 C) (05/09 0444) Pulse Rate:  [68-155] 89 (05/09 0700) Resp:  [12-23] 16 (05/09 0700) BP: (92-148)/(53-127) 139/63 (05/09 0700) SpO2:  [87 %-100 %] 100 % (05/09 0700) Weight:  [95.4 kg] 95.4 kg (05/09 0444) Last BM Date : 08/12/23  Intake/Output from previous day: 05/08 0701 - 05/09 0700 In: 3569.2 [I.V.:3367.9; IV Piggyback:201.2] Out: 1050 [Urine:850; Emesis/NG output:125; Stool:75] Intake/Output this shift: No intake/output data recorded.  General appearance: alert and no distress Resp: normal work of breathing GI: soft, reducible hernia, ileostomy with prolapse, pink and healthy, ? Possible gas in bag   Lab Results:  Recent Labs    08/12/23 0449  WBC 16.2*  HGB 10.1*  HCT 33.6*  PLT 250   BMET Recent Labs    08/13/23 0822 08/14/23 0637  NA 141 141  K 3.3* 3.5  CL 99 100  CO2 28 30  GLUCOSE 126* 166*  BUN 63* 57*  CREATININE 2.60* 2.27*  CALCIUM  8.0* 8.4*   PT/INR No results for input(s): "LABPROT", "INR" in the last 72 hours.  Studies/Results: CT CHEST ABDOMEN PELVIS WO CONTRAST Result Date: 08/12/2023 CLINICAL DATA:  Sepsis.  Small-bowel obstruction. EXAM: CT CHEST, ABDOMEN AND PELVIS WITHOUT CONTRAST TECHNIQUE: Multidetector CT imaging of the chest, abdomen and pelvis was performed following the standard protocol without IV contrast. RADIATION DOSE REDUCTION: This exam was performed according to the departmental  dose-optimization program which includes automated exposure control, adjustment of the mA and/or kV according to patient size and/or use of iterative reconstruction technique. COMPARISON:  Aug 06, 2023. FINDINGS: CT CHEST FINDINGS Cardiovascular: Status post stent graft repair of thoracic aorta. Mild cardiomegaly. No pericardial effusion. Mediastinum/Nodes: Thyroid gland is unremarkable. Distal tip of feeding tube terminates in distal esophagus. No adenopathy. Lungs/Pleura: No pneumothorax is noted. Minimal bibasilar subsegmental atelectasis is noted with probable minimal pleural effusions. Musculoskeletal: No chest wall mass or suspicious bone lesions identified. CT ABDOMEN PELVIS FINDINGS Hepatobiliary: No cholelithiasis or biliary dilatation. Liver is unremarkable on these unenhanced images. Pancreas: Unremarkable. No pancreatic ductal dilatation or surrounding inflammatory changes. Spleen: Normal in size without focal abnormality. Adrenals/Urinary Tract: Adrenal glands are unremarkable. Kidneys are normal, without renal calculi, focal lesion, or hydronephrosis. Bladder is unremarkable. Stomach/Bowel: Stomach is unremarkable. Loop ileostomy is again noted in right lower quadrant. There is a large associated peristomal hernia which contains loops of small bowel, and appears to be resulting in severe obstruction of more proximal small bowel loops. Dilated small bowel loops are also noted within the peristomal hernia itself. Another smaller ventral hernia is noted in central portion of abdomen and periumbilical region with small bowel loops within it, also potentially causing obstruction. No definite colonic dilatation is noted. Vascular/Lymphatic: Aortic atherosclerosis. 5.2 cm infrarenal abdominal aortic aneurysm is noted. No enlarged abdominal or pelvic lymph nodes. Reproductive: Uterus and bilateral adnexa are unremarkable. Other: Minimal free fluid is noted in the pelvis. Musculoskeletal: No acute or significant  osseous findings. IMPRESSION: There is again noted severe small bowel obstruction due to small bowel loops being incarcerated within 2 ventral hernias, including the large peristomal hernia involving loop ileostomy in right lower quadrant, as well as the large periumbilical hernia. 5.2 cm infrarenal abdominal aortic aneurysm. Recommend follow-up CT or MR as appropriate in 6 months and referral to or continued care with vascular specialist. (Ref.: J Vasc Surg. 2018; 67:2-77 and J Am Coll Radiol 2013;10(10):789-794.) Minimal bibasilar subsegmental atelectasis with small pleural effusions. Aortic Atherosclerosis (ICD10-I70.0). Electronically Signed   By: Rosalene Colon M.D.   On: 08/12/2023 17:44   DG CHEST PORT 1 VIEW Result Date: 08/12/2023 CLINICAL DATA:  Catheter insertion EXAM: PORTABLE CHEST 1 VIEW COMPARISON:  Chest x-ray 08/10/2023 FINDINGS: Right upper extremity PICC terminates in the SVC. There is no pneumothorax. Enteric tube tip is in the distal esophagus 8 cm proximal from the diaphragm. This is unchanged. The cardiomediastinal silhouette is stable. The heart is enlarged. Aortic graft is present as well sternotomy wires. There are bands of atelectasis in the lower lungs. No new focal lung infiltrate, pleural effusion or pneumothorax. IMPRESSION: 1. Right upper extremity PICC terminates in the SVC. No pneumothorax. 2. Enteric tube tip is in the distal esophagus 8 cm proximal from the diaphragm. This is unchanged. Recommend advancement. Electronically Signed   By: Tyron Gallon M.D.   On: 08/12/2023 17:08   US  EKG SITE RITE Result Date: 08/12/2023 If Site Rite image not attached, placement could not be confirmed due to current cardiac rhythm.  ECHOCARDIOGRAM COMPLETE Result Date: 08/12/2023    ECHOCARDIOGRAM REPORT   Patient Name:   Dorothy Harris [facility] Date of Exam: 08/12/2023 Medical Rec #:  [id number]     Height:       67.0 in Accession #:    [id number]    Weight:       204.8 lb Date of Birth:  Aug 06, 1960      BSA:          2.043 m Patient Age:    62 years      BP:           92/63 mmHg Patient Gender: F             HR:           107 bpm. Exam Location:  [facility] Procedure: 2D Echo, Cardiac Doppler, Color Doppler and Intracardiac            Opacification Agent (Both Spectral and Color Flow Doppler were            utilized during procedure). Indications:    Atrial Fibrillation  History:        Patient has prior history of Echocardiogram examinations.                 NSTEMI, Arrythmias:Atrial Fibrillation; Risk                 Factors:Hypertension.  Sonographer:    Willey Harrier Referring Phys: [id number] [facility]  Sonographer Comments: Technically difficult study due to poor echo windows. Image acquisition challenging due to patient body habitus. IMPRESSIONS  1. Left ventricular ejection fraction, by estimation, is 60 to 65%. The left ventricle has normal function. The left ventricle has no regional wall motion abnormalities. There is severe asymmetric left ventricular hypertrophy of the septal segment. Left  ventricular diastolic function could not be evaluated.  2. Right ventricular systolic function is normal. The right ventricular size is normal.  There is normal pulmonary artery systolic pressure.  3. Left atrial size was severely dilated.  4. The mitral valve is normal in structure. Trivial mitral valve regurgitation. No evidence of mitral stenosis.  5. The aortic valve is tricuspid. There is moderate calcification of the aortic valve. Aortic valve regurgitation is not visualized. No aortic stenosis is present.  6. The inferior vena cava is normal in size with greater than 50% respiratory variability, suggesting right atrial pressure of 3 mmHg. FINDINGS  Left Ventricle: Left ventricular ejection fraction, by estimation, is 60 to 65%. The left ventricle has normal function. The left ventricle has no regional wall motion abnormalities. Strain was performed and the global longitudinal strain is indeterminate. The  left ventricular internal cavity size was normal in size. There is severe asymmetric left ventricular hypertrophy of the septal segment. Left ventricular diastolic function could not be evaluated due to atrial fibrillation. Left ventricular diastolic function could not be evaluated. Right Ventricle: The right ventricular size is normal. No increase in right ventricular wall thickness. Right ventricular systolic function is normal. There is normal pulmonary artery systolic pressure. The tricuspid regurgitant velocity is 2.50 m/s, and  with an assumed right atrial pressure of 3 mmHg, the estimated right ventricular systolic pressure is 28.0 mmHg. Left Atrium: Left atrial size was severely dilated. Right Atrium: Right atrial size was normal in size. Pericardium: There is no evidence of pericardial effusion. Mitral Valve: The mitral valve is normal in structure. Trivial mitral valve regurgitation. No evidence of mitral valve stenosis. MV peak gradient, 5.0 mmHg. The mean mitral valve gradient is 2.3 mmHg. Tricuspid Valve: The tricuspid valve is normal in structure. Tricuspid valve regurgitation is trivial. No evidence of tricuspid stenosis. Aortic Valve: The aortic valve is tricuspid. There is moderate calcification of the aortic valve. Aortic valve regurgitation is not visualized. No aortic stenosis is present. Aortic valve peak gradient measures 7.7 mmHg. Pulmonic Valve: The pulmonic valve was normal in structure. Pulmonic valve regurgitation is not visualized. No evidence of pulmonic stenosis. Aorta: The aortic root and ascending aorta are structurally normal, with no evidence of dilitation. Venous: The inferior vena cava is normal in size with greater than 50% respiratory variability, suggesting right atrial pressure of 3 mmHg. IAS/Shunts: No atrial level shunt detected by color flow Doppler. Additional Comments: 3D was performed not requiring image post processing on an independent workstation and was  indeterminate.  LEFT VENTRICLE PLAX 2D LVIDd:         3.80 cm LVIDs:         2.40 cm LV PW:         1.30 cm LV IVS:        1.80 cm LVOT diam:     2.10 cm LV SV:         54 LV SV Index:   26 LVOT Area:     3.46 cm  IVC IVC diam: 1.70 cm LEFT ATRIUM              Index        RIGHT ATRIUM           Index LA diam:        4.80 cm  2.35 cm/m   RA Area:     11.10 cm LA Vol (A2C):   63.7 ml  31.18 ml/m  RA Volume:   21.70 ml  10.62 ml/m LA Vol (A4C):   112.0 ml 54.83 ml/m LA Biplane Vol: 86.9 ml  42.54 ml/m  AORTIC  VALVE AV Area (Vmax): 2.66 cm AV Vmax:        138.67 cm/s AV Peak Grad:   7.7 mmHg LVOT Vmax:      106.50 cm/s LVOT Vmean:     66.950 cm/s LVOT VTI:       0.156 m  AORTA Ao Root diam: 2.80 cm Ao Asc diam:  3.20 cm MITRAL VALVE                TRICUSPID VALVE MV Area (PHT): 3.95 cm     TR Peak grad:   25.0 mmHg MV Area VTI:   3.18 cm     TR Vmax:        250.00 cm/s MV Peak grad:  5.0 mmHg MV Mean grad:  2.3 mmHg     SHUNTS MV Vmax:       1.12 m/s     Systemic VTI:  0.16 m MV Vmean:      61.9 cm/s    Systemic Diam: 2.10 cm MV Decel Time: 192 msec MV E velocity: 104.50 cm/s Vishnu Priya Mallipeddi Electronically signed by Lucetta Russel Mallipeddi Signature Date/Time: 08/12/2023/12:50:15 PM    Final     Anti-infectives: Anti-infectives (From admission, onward)    None       Assessment/Plan: Patient with SBO in association with ventral hernia and parastomal hernia, both are reducible but given chronicity after reduction the hernia pops back out.  Ileostomy with prolapse but pink and healthy and reduces, this is not the point of obstruction.  Imaging and exam reassuring without signs of any ischemic bowel or perforation.   Patient high risk from vascular/ cardiac standpoint. AKI improving NG output less, she is taking in some ginger ale and hard candy for comfort with NG on suction Ileostomy prolapse, stable  Ileostomy with some minimal output NPO, NG in place for SBO given her high risk TPN  ordered for nutrition Patient may ultimately need transfer to other facility if not resolving the obstruction given her high risk and need for closer cardiac surveillance in the post operative setting than we can provide at [facility], will have to consider this in the next week if not resolving   Dr. Larrie Po seeing this weekend.    LOS: 7 days    Awilda Bogus 08/14/2023

## 2023-08-14 NOTE — Progress Notes (Signed)
 PHARMACY - TOTAL PARENTERAL NUTRITION CONSULT NOTE   Indication: Small bowel obstruction  Patient Measurements: Height: 5\' 7"  (170.2 cm) Weight: 95.4 kg (210 lb 5.1 oz) IBW/kg (Calculated) : 61.6 TPN AdjBW (KG): 69.4 Body mass index is 32.94 kg/m. Usual Weight:   Assessment:  Small bowel obstruction with ventral hernia and end ileostomy.  Glucose / Insulin : 94-168- 4 units insulin  given  Electrolytes: K 3.3>3.5 Corrected calcium  9.3 Renal: Scr 2.6> 2.27 Hepatic: WNL    Central access: PICC TPN start date:  5/8  Nutritional Goals: Goal TPN rate is 80 mL/hr (provides 115 g of protein and 1920 kcals per day)  RD Assessment: Estimated Needs Total Energy Estimated Needs: 1900-2100 Total Protein Estimated Needs: 110-130 gm Total Fluid Estimated Needs: 1.9-2.1 L  Current Nutrition:  NPO  Plan:  Increase TPN to 80 mL/hr at 1800 Electrolytes in TPN: Na 12mEq/L, K 31mEq/L, Ca 36mEq/L, Mg 11mEq/L, and Phos 10mmol/L. Cl:Ac 1:1 Add standard MVI and trace elements to TPN Initiate Sensitive q8h SSI and adjust as needed  Monitor TPN labs on Mon/Thurs,   Cliffton Dama, PharmD Clinical Pharmacist 08/14/2023 9:54 AM

## 2023-08-15 DIAGNOSIS — Z932 Ileostomy status: Secondary | ICD-10-CM | POA: Diagnosis not present

## 2023-08-15 DIAGNOSIS — K56609 Unspecified intestinal obstruction, unspecified as to partial versus complete obstruction: Secondary | ICD-10-CM | POA: Diagnosis not present

## 2023-08-15 DIAGNOSIS — N179 Acute kidney failure, unspecified: Secondary | ICD-10-CM | POA: Diagnosis not present

## 2023-08-15 DIAGNOSIS — I480 Paroxysmal atrial fibrillation: Secondary | ICD-10-CM | POA: Diagnosis not present

## 2023-08-15 LAB — RENAL FUNCTION PANEL
Albumin: 1.9 g/dL — ABNORMAL LOW (ref 3.5–5.0)
Anion gap: 10 (ref 5–15)
BUN: 58 mg/dL — ABNORMAL HIGH (ref 8–23)
CO2: 27 mmol/L (ref 22–32)
Calcium: 8 mg/dL — ABNORMAL LOW (ref 8.9–10.3)
Chloride: 105 mmol/L (ref 98–111)
Creatinine, Ser: 2.06 mg/dL — ABNORMAL HIGH (ref 0.44–1.00)
GFR, Estimated: 27 mL/min — ABNORMAL LOW (ref >60)
Glucose, Bld: 188 mg/dL — ABNORMAL HIGH (ref 70–99)
Phosphorus: 2.6 mg/dL (ref 2.5–4.6)
Potassium: 4 mmol/L (ref 3.5–5.1)
Sodium: 142 mmol/L (ref 135–145)

## 2023-08-15 LAB — MAGNESIUM: Magnesium: 1.8 mg/dL (ref 1.7–2.4)

## 2023-08-15 LAB — GLUCOSE, CAPILLARY
Glucose-Capillary: 174 mg/dL — ABNORMAL HIGH (ref 70–99)
Glucose-Capillary: 179 mg/dL — ABNORMAL HIGH (ref 70–99)
Glucose-Capillary: 190 mg/dL — ABNORMAL HIGH (ref 70–99)
Glucose-Capillary: 195 mg/dL — ABNORMAL HIGH (ref 70–99)

## 2023-08-15 MED ORDER — METOPROLOL TARTRATE 5 MG/5ML IV SOLN
5.0000 mg | INTRAVENOUS | Status: DC
  Administered 2023-08-15 – 2023-08-28 (×73): 5 mg via INTRAVENOUS
  Filled 2023-08-15 (×74): qty 5

## 2023-08-15 MED ORDER — TRAVASOL 10 % IV SOLN
INTRAVENOUS | Status: AC
  Filled 2023-08-15: qty 1152

## 2023-08-15 MED ORDER — METOPROLOL TARTRATE 5 MG/5ML IV SOLN
5.0000 mg | Freq: Once | INTRAVENOUS | Status: AC
Start: 2023-08-15 — End: 2023-08-15
  Administered 2023-08-15: 5 mg via INTRAVENOUS
  Filled 2023-08-15: qty 5

## 2023-08-15 MED ORDER — AMIODARONE LOAD VIA INFUSION
150.0000 mg | Freq: Once | INTRAVENOUS | Status: AC
Start: 2023-08-15 — End: 2023-08-15
  Administered 2023-08-15: 150 mg via INTRAVENOUS
  Filled 2023-08-15: qty 83.34

## 2023-08-15 NOTE — Progress Notes (Signed)
  Subjective: Patient denies any abdominal pain.  She appears to be having more output from her ileostomy bag which is watery in nature.  NG tube output is decreasing.  She did have an episode of rapid atrial fibrillation yesterday evening despite being on medications.  Objective: Vital signs in last 24 hours: Temp:  [98.7 F (37.1 C)-101.1 F (38.4 C)] 99.9 F (37.7 C) (05/10 0723) Pulse Rate:  [109-139] 122 (05/10 0700) Resp:  [15-24] 17 (05/10 0700) BP: (105-136)/(62-85) 112/67 (05/10 0700) SpO2:  [93 %-100 %] 93 % (05/10 0700) Weight:  [95.4 kg] 95.4 kg (05/10 0500) Last BM Date : 08/12/23  Intake/Output from previous day: 05/09 0701 - 05/10 0700 In: 1761.6 [I.V.:1761.6] Out: 1380 [Urine:1075; Emesis/NG output:80; Stool:225] Intake/Output this shift: Total I/O In: 180.5 [I.V.:180.5] Out: -   General appearance: alert, cooperative, and no distress GI: Soft with reducible hernia.  Ileostomy with prolapse but is pink and healthy.  There is fluid in the ileostomy bag.  A small amount of air is present.  Abdomen is nontender.  Lab Results:  No results for input(s): "WBC", "HGB", "HCT", "PLT" in the last 72 hours. BMET Recent Labs    08/14/23 0637 08/15/23 0549  NA 141 142  K 3.5 4.0  CL 100 105  CO2 30 27  GLUCOSE 166* 188*  BUN 57* 58*  CREATININE 2.27* 2.06*  CALCIUM  8.4* 8.0*   PT/INR No results for input(s): "LABPROT", "INR" in the last 72 hours.  Studies/Results: No results found.  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  It seems like her bowels are starting to function. Plan: Will continue to monitor NG tube as well as ileostomy output.  Should her ileostomy output continue to increase, will remove NG tube in hopes that her partial bowel obstruction has resolved.  LOS: 8 days     Alanda Allegra 08/15/2023

## 2023-08-15 NOTE — Progress Notes (Signed)
 PHARMACY - TOTAL PARENTERAL NUTRITION CONSULT NOTE   Indication: Small bowel obstruction  Patient Measurements: Height: 5\' 7"  (170.2 cm) Weight: 95.4 kg (210 lb 5.1 oz) IBW/kg (Calculated) : 61.6 TPN AdjBW (KG): 69.4 Body mass index is 32.94 kg/m. Usual Weight:   Assessment:  Small bowel obstruction with ventral hernia and end ileostomy.  Glucose / Insulin : 168-190- 8 units insulin  given  Electrolytes: K 4.0 Corrected calcium  9.7 Renal: Scr 2.6> 2.27>2.0 Hepatic: WNL  Central access: PICC TPN start date:  5/8  Nutritional Goals: Goal TPN rate is 80 mL/hr (provides 115 g of protein and 1920 kcals per day)  RD Assessment: Estimated Needs Total Energy Estimated Needs: 1900-2100 Total Protein Estimated Needs: 110-130 gm Total Fluid Estimated Needs: 1.9-2.1 L  Current Nutrition:  NPO  Plan:  Continue TPN at 80 mL/hr at 1800 Electrolytes in TPN: Na 11mEq/L, K 31mEq/L, Ca 48mEq/L, Mg 57mEq/L, and Phos 20mmol/L. Cl:Ac 1:1 Add standard MVI and trace elements to TPN Continue Sensitive q8h SSI and adjust as needed  Monitor TPN labs on Mon/Thurs,   Audra Blend PharmD., BCPS Clinical Pharmacist 08/15/2023 10:02 AM

## 2023-08-15 NOTE — Progress Notes (Signed)
 PROGRESS NOTE  Dorothy Harris, is a 63 y.o. 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 - General Surgery consult from Dr. Collene Dawson appreciated --- the hernia appears to be reducible per general surgeon Continue NGT decompression - Continue IV fluids while NPO - If patient fails conservative approach--general surgeon believes that ideally patient should have a colonoscopy prior to surgical intervention with lysis of adhesions and possible reversal of ileostomy -Per general surgery team there is a possibility of surgical intervention; if patient failed ongoing conservative management (last option given high risk). -TPN has been started; pharmacy to assist with rate and levels. -continue to follow electrolytes. -continue NPO status, follow electrolytes and replete as needed -continue to maintain adequate hydration -patient remains high risk for any surgical intervention.  2) AKI----acute kidney injury on CKD stage IV -due to dehydration/volume depletion in a patient with SBO and n.p.o. status with high output from NG tube -creatinine on admission= 4.59,  baseline creatinine = 2.0 to 2.2  ,   -creatinine peaked at 8.07 (on 08/09/23) -Continue trending down---currently 2.06 with ongoing hydration. - Nephrology consultation appreciated - No metabolic acidosis and no hyperkalemia at this time, elevated anion gap noted - patient is volume depleted, -continue to maintain adequate hydration, follow nephrology rec's -Renally adjust medications, avoid nephrotoxic agents / hypotension  Intake/Output Summary (Last 24 hours) at 08/15/2023 1038 Last data filed at 08/15/2023 1000 Gross per 24 hour  Intake 2191.42 ml  Output 1380 ml  Net 811.42 ml    3) atypical chest pains with  elevated Troponin: -EKG nonacute suspect demand ischemia due to AKI and  bowel obstruction.  Troponin 214 >>206-- echo requested - Lower extremity Dopplers and VQ scan negative for DVT and PE respectively 08/11/23 - Patient remains at high risk for any surgical intervention. - Continue rate control with the use of IV amiodarone  - No ACS concerns per cardiology recommendations; 2D echo reassuring and demonstrating preserved ejection fraction. -continue holding B-blocker at the moment, due to soft BP.   4)Essential hypertension: BP on the soft sides and will require pressors.  - c/n to Hold PTA amlodipine  and oral hydralazine   - Continue to follow VS   5)Paroxysmal A-fib: -PTA amiodarone  and Coreg  on hold due to n.p.o. status -Patient was not on anticoagulation PTA -follow echo results - Continue IV amiodarone  drip; intermittent boluses will be provided to assist with rate control.[name]   6)HFpEF w/ tte 8/24-EF 65 to 70%, G1DD: Currently volume depleted needing IV fluids due to SBO.   - Be judicious with IV fluids - follow Echo results -continue to follow I's and O's  7)HLD - Continue to hold PTA atorvastatin  while n.p.o.  8)Hypokalemia -Due to GI losses -  Continue to follow trend and replete as needed. -Magnesium  WNL - Keep potassium around 4 especially in view of arrhythmias. -plan is for Mg around  2.  9)Hypernatremia--- due to dehydration,  - Creatinine is down to 144 from 147 continue fluids as ordered  10)acute urinary retention -in and out cath X 3 without success of voiding spontaneously; foley catheter placed and good urine output appreciated. - Creatinine has further improved and back to her baseline at the moment. - No suprapubic tenderness reported.  11)leukocytosis and abd pain - Continue to hold antibiotics at the moment - CT chest no demonstrating perforation or abscesses; images with ongoing consistent SBO pattern. - Continue to follow WBCs trend intermittently; patient is afebrile.    Status is: Inpatient   Subcu heparin  for DVT prophylaxis - Protonix  IV for GI prophylaxis  Disposition: The patient is from: Home              Anticipated d/c is to: Home              Anticipated d/c date is: > 3 days              Patient currently is not medically stable to d/c.  Barriers: Not Clinically Stable-   Code Status :  -  Code Status: Full Code   Family Communication:   (patient is alert, awake and coherent)  No family at bedside on today's evaluation.  DVT Prophylaxis  :   - SCDs   heparin  injection 5,000 Units Start: 08/10/23 1400 SCDs Start: 08/07/23 0437   Lab Results  Component Value Date   PLT 250 08/12/2023   Inpatient Medications  Scheduled Meds:  amiodarone   150 mg Intravenous Once   Chlorhexidine  Gluconate Cloth  6 each Topical Daily   heparin  injection (subcutaneous)  5,000 Units Subcutaneous Q8H   insulin  aspart  0-9 Units Subcutaneous Q8H   pantoprazole  (PROTONIX ) IV  40 mg Intravenous Q12H   sodium chloride  flush  10-40 mL Intracatheter Q12H   Continuous Infusions:  amiodarone  30 mg/hr (08/15/23 1000)   TPN ADULT (ION) 80 mL/hr at 08/15/23 1000   TPN ADULT (ION)     PRN Meds:.acetaminophen  **OR** acetaminophen , hydrALAZINE , HYDROmorphone  (DILAUDID ) injection, phenol, prochlorperazine , sodium chloride  flush   Anti-infectives (From  admission, onward)    None       Subjective: Dorothy Harris no fever, reports no nausea vomiting.  NG tube in place.  Continue experiencing intermittent episodes supporting A-fib with RVR and expressed intermittent abdominal discomfort.  Objective: Vitals:   08/15/23 0723 08/15/23 0800 08/15/23 0900 08/15/23 1000  BP:  111/70 110/63 118/75  Pulse:  (!) 131 (!) 123 (!) 140  Resp:  19 18 (!) 27  Temp: 99.9 F (37.7 C)     TempSrc:      SpO2:  90% 93% 94%  Weight:      Height:        Intake/Output Summary (Last 24 hours) at 08/15/2023 1038 Last data filed at 08/15/2023 1000 Gross per 24 hour  Intake 2191.42 ml  Output 1380 ml  Net 811.42 ml   Filed Weights   08/13/23 0429 08/14/23 0444 08/15/23 0500  Weight: 94.2 kg 95.4 kg 95.4 kg    Physical Exam General exam: Alert, awake, oriented x 3; still experiencing intermittent episodes of uncontrolled atrial fibrillation and is presently expressing intermittent abdominal discomfort.  NG tube in place. Respiratory system: Normal respiratory effort, good air movement bilaterally appreciated.  No using accessory muscles. Cardiovascular system:  Regular irregular; no rubs or gallops. Gastrointestinal system: Abdomen is mildly distended, no guarding, positive bowel sounds and ileostomy in place. Central nervous system: Moving 4 limbs spontaneously.  No focal neurological deficits. Extremities: No cyanosis or clubbing; trace edema appreciated bilaterally.  Right upper extremity with PICC line in place and TPN running. Skin: No petechiae. Psychiatry: Flat affect,  Data Reviewed: I have personally reviewed following labs and imaging studies  CBC: Recent Labs  Lab 08/09/23 0557 08/10/23 0513 08/11/23 0510 08/12/23 0449  WBC 10.1 13.3* 13.3* 16.2*  HGB 12.3 11.3* 9.7* 10.1*  HCT 37.1 36.1 31.5* 33.6*  MCV 98.9 102.6* 104.3* 103.7*  PLT 310 305 253 250   Basic Metabolic Panel: Recent Labs  Lab 08/10/23 0503 08/11/23 0510  08/12/23 0449 08/13/23 0822 08/14/23 0637 08/15/23 0549  NA 147* 144 143 141 141 142  K 3.0* 3.3* 3.7 3.3* 3.5 4.0  CL 92* 99 99 99 100 105  CO2 34* 32 28 28 30 27   GLUCOSE 122* 104* 107* 126* 166* 188*  BUN 96* 75* 70* 63* 57* 58*  CREATININE 5.92* 3.44* 2.88* 2.60* 2.27* 2.06*  CALCIUM  6.9* 8.4* 8.0* 8.0* 8.4* 8.0*  MG 2.3  --  1.9  --  1.9 1.8  PHOS 5.7*  --  3.8  --  3.2 2.6   GFR: Estimated Creatinine Clearance: 33.6 mL/min (A) (by C-G formula based on SCr of 2.06 mg/dL (H)).  Liver Function Tests: Recent Labs  Lab 08/10/23 0503 08/12/23 0449 08/14/23 0637 08/15/23 0549  AST  --   --  19  --   ALT  --   --  12  --   ALKPHOS  --   --  61  --   BILITOT  --   --  0.5  --   PROT  --   --  5.8*  --   ALBUMIN  3.2* 2.4* 2.3* 1.9*   Radiology Studies: No results found.  Scheduled Meds:  amiodarone   150 mg Intravenous Once   Chlorhexidine  Gluconate Cloth  6 each Topical Daily   heparin  injection (subcutaneous)  5,000 Units Subcutaneous Q8H   insulin  aspart  0-9 Units Subcutaneous Q8H   pantoprazole  (PROTONIX ) IV  40 mg Intravenous Q12H   sodium chloride  flush  10-40 mL Intracatheter Q12H   Continuous Infusions:  amiodarone  30 mg/hr (08/15/23 1000)   TPN ADULT (ION) 80 mL/hr at 08/15/23 1000   TPN ADULT (ION)      LOS: 8 days   Justina Oman M.D on 08/15/2023 at 10:38 AM  CRITICAL CARE Performed by: Justina Oman   Total critical care time: 50 minutes  Critical care time was exclusive of separately billable procedures and treating other patients.  Critical care was necessary to treat or prevent imminent or life-threatening deterioration.  Critical care was time spent personally by me on the following activities: development of treatment plan with patient and/or surrogate as well as nursing, discussions with consultants, evaluation of patient's response to treatment, examination of patient, obtaining history from patient or surrogate, ordering and performing  treatments and interventions, ordering and review of laboratory studies, ordering and review of radiographic studies, pulse oximetry and re-evaluation of patient's condition.     Go to [email or web address] - for contact info  [facility] - Office  [phone]  If 7PM-7AM, please contact night-coverage [email or web address] 08/15/2023, 10:38 AM

## 2023-08-15 NOTE — Progress Notes (Signed)
 0710 patient alert X4 able to make all needs known patient had NG tube to LIS TPN and AMIO GTT running in PICC line.  1030 PRN pain meds given 1130 increase to AMIO GTT and Bolus per orders completed HR went from 140s to 110

## 2023-08-15 NOTE — Plan of Care (Signed)
  Problem: Education: Goal: Knowledge of General Education information will improve Description: Including pain rating scale, medication(s)/side effects and non-pharmacologic comfort measures Outcome: Progressing   Problem: Health Behavior/Discharge Planning: Goal: Ability to manage health-related needs will improve Outcome: Progressing   Problem: Clinical Measurements: Goal: Ability to maintain clinical measurements within normal limits will improve Outcome: Progressing Goal: Will remain free from infection Outcome: Not Progressing Goal: Diagnostic test results will improve Outcome: Not Progressing Goal: Respiratory complications will improve Outcome: Progressing Goal: Cardiovascular complication will be avoided Outcome: Not Progressing   Problem: Activity: Goal: Risk for activity intolerance will decrease Outcome: Not Progressing   Problem: Nutrition: Goal: Adequate nutrition will be maintained Outcome: Not Progressing   Problem: Coping: Goal: Level of anxiety will decrease Outcome: Progressing   Problem: Elimination: Goal: Will not experience complications related to bowel motility Outcome: Not Progressing Goal: Will not experience complications related to urinary retention Outcome: Not Progressing   Problem: Pain Managment: Goal: General experience of comfort will improve and/or be controlled Outcome: Not Progressing   Problem: Safety: Goal: Ability to remain free from injury will improve Outcome: Not Progressing   Problem: Skin Integrity: Goal: Risk for impaired skin integrity will decrease Outcome: Not Progressing

## 2023-08-15 NOTE — Progress Notes (Signed)
 Patient HR been sustaining in 130s-140s all night, Afib RVR, on Amio Drip at 30mg /hr, other Vital signs stable, pain managed as per orders, patient is asymptomatic, not on distress and is resting comfortably on bed, Night MD Arrien made aware, recommend continuing Amiodarone  and monitoring,  no new orders placed, will continue to monitor and will endorse to the oncoming RN.

## 2023-08-16 DIAGNOSIS — I10 Essential (primary) hypertension: Secondary | ICD-10-CM | POA: Diagnosis not present

## 2023-08-16 DIAGNOSIS — E66811 Obesity, class 1: Secondary | ICD-10-CM | POA: Diagnosis not present

## 2023-08-16 DIAGNOSIS — K56609 Unspecified intestinal obstruction, unspecified as to partial versus complete obstruction: Secondary | ICD-10-CM | POA: Diagnosis not present

## 2023-08-16 DIAGNOSIS — N179 Acute kidney failure, unspecified: Secondary | ICD-10-CM | POA: Diagnosis not present

## 2023-08-16 LAB — GLUCOSE, CAPILLARY
Glucose-Capillary: 165 mg/dL — ABNORMAL HIGH (ref 70–99)
Glucose-Capillary: 172 mg/dL — ABNORMAL HIGH (ref 70–99)
Glucose-Capillary: 176 mg/dL — ABNORMAL HIGH (ref 70–99)
Glucose-Capillary: 197 mg/dL — ABNORMAL HIGH (ref 70–99)
Glucose-Capillary: 197 mg/dL — ABNORMAL HIGH (ref 70–99)
Glucose-Capillary: 217 mg/dL — ABNORMAL HIGH (ref 70–99)

## 2023-08-16 LAB — RENAL FUNCTION PANEL
Albumin: 1.9 g/dL — ABNORMAL LOW (ref 3.5–5.0)
Anion gap: 10 (ref 5–15)
BUN: 68 mg/dL — ABNORMAL HIGH (ref 8–23)
CO2: 25 mmol/L (ref 22–32)
Calcium: 8 mg/dL — ABNORMAL LOW (ref 8.9–10.3)
Chloride: 107 mmol/L (ref 98–111)
Creatinine, Ser: 2.13 mg/dL — ABNORMAL HIGH (ref 0.44–1.00)
GFR, Estimated: 26 mL/min — ABNORMAL LOW (ref >60)
Glucose, Bld: 234 mg/dL — ABNORMAL HIGH (ref 70–99)
Phosphorus: 3.2 mg/dL (ref 2.5–4.6)
Potassium: 4 mmol/L (ref 3.5–5.1)
Sodium: 142 mmol/L (ref 135–145)

## 2023-08-16 MED ORDER — INSULIN ASPART 100 UNIT/ML IJ SOLN
0.0000 [IU] | Freq: Three times a day (TID) | INTRAMUSCULAR | Status: DC
  Administered 2023-08-16 (×2): 3 [IU] via SUBCUTANEOUS
  Administered 2023-08-17: 2 [IU] via SUBCUTANEOUS
  Administered 2023-08-17 – 2023-08-18 (×4): 3 [IU] via SUBCUTANEOUS
  Administered 2023-08-19: 2 [IU] via SUBCUTANEOUS
  Administered 2023-08-19 (×2): 3 [IU] via SUBCUTANEOUS
  Administered 2023-08-20: 8 [IU] via SUBCUTANEOUS
  Administered 2023-08-21: 5 [IU] via SUBCUTANEOUS

## 2023-08-16 MED ORDER — ACETAMINOPHEN 325 MG PO TABS: 650.0000 mg | ORAL_TABLET | Freq: Four times a day (QID) | ORAL | Status: DC | PRN

## 2023-08-16 MED ORDER — BOOST / RESOURCE BREEZE PO LIQD CUSTOM
1.0000 | Freq: Three times a day (TID) | ORAL | Status: DC
  Administered 2023-08-16 (×2): 1 via ORAL

## 2023-08-16 MED ORDER — POTASSIUM ACETATE 2 MEQ/ML IV SOLN
INTRAVENOUS | Status: AC
  Filled 2023-08-16: qty 1152

## 2023-08-16 MED ORDER — ACETAMINOPHEN 650 MG RE SUPP: 650.0000 mg | Freq: Four times a day (QID) | RECTAL | Status: DC | PRN

## 2023-08-16 NOTE — Progress Notes (Signed)
 PROGRESS NOTE  Dorothy Harris, is a 63 y.o. 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 - General Surgery consult from Dr. Collene Dawson appreciated --- the hernia appears to be reducible per general surgeon -At this moment NG tube has been removed and there is plan for full liquid diet initiation. - Continue supportive care, electrolyte repletion and as needed analgesics/antiemetics. -Per general surgery team there is a possibility of surgical intervention; if patient failed ongoing conservative management (last option given high risk). -TPN has been started; pharmacy to assist with rate and levels. -continue to follow electrolytes. -patient remains high risk for any surgical intervention.  2) AKI----acute kidney injury on CKD stage IV -due to dehydration/volume depletion in a patient with SBO and n.p.o. status with high output from NG tube -creatinine on admission= 4.59,  baseline creatinine = 2.0 to 2.2  ,  -creatinine peaked at 8.07 (on 08/09/23) -Continue trending down---currently 2.13 with ongoing hydration. - Nephrology consultation appreciated - No metabolic acidosis and no hyperkalemia at this  time, elevated anion gap noted - patient is volume depleted, -continue to maintain adequate hydration, follow nephrology rec's -Renally adjust medications, avoid nephrotoxic agents / hypotension  Intake/Output Summary (Last 24 hours) at 08/16/2023 1134 Last data filed at 08/16/2023 1100 Gross per 24 hour  Intake 2719.28 ml  Output 1525 ml  Net 1194.28 ml    3) atypical chest pains with  elevated Troponin: -EKG nonacute suspect demand ischemia due to AKI and  bowel obstruction.  Troponin 214 >>206-- echo requested - Lower extremity Dopplers and VQ scan negative for DVT and PE respectively 08/11/23 - Patient remains at high risk for any surgical intervention. - Continue rate control with the use of IV amiodarone  - No ACS concerns per cardiology recommendations; 2D echo reassuring and demonstrating preserved ejection fraction. -continue holding B-blocker at the moment, due to soft BP.   4)Essential hypertension: BP on the soft sides and will require pressors.  - c/n to Hold PTA amlodipine  and oral hydralazine   - Continue to follow VS   5)Paroxysmal A-fib: -PTA amiodarone  and Coreg  on hold due to n.p.o. status -Patient was not on anticoagulation PTA -follow echo results - Continue IV amiodarone  drip; intermittent boluses will be provided to assist with rate control.Dorothy Harris   6)HFpEF w/ tte 8/24-EF 65 to 70%, G1DD: Currently volume depleted needing IV fluids due to SBO.   - Be judicious with IV fluids - follow Echo results -continue to follow I's and O's  7)HLD - Continue to hold PTA atorvastatin  while n.p.o.  8)Hypokalemia -Due to GI losses - Continue to follow trend and replete as needed. -Magnesium  WNL - Keep potassium around 4 especially in view of arrhythmias. -plan is for Mg  around 2.  9)Hypernatremia--- due to dehydration,  - Creatinine is down to 144 from 147 continue fluids as ordered  10)acute urinary retention -in and out cath X 3 without success of voiding  spontaneously; foley catheter placed and good urine output appreciated. - Creatinine has further improved and back to her baseline at the moment. - No suprapubic tenderness reported.  11)leukocytosis and abd pain - Continue to hold antibiotics at the moment - CT chest no demonstrating perforation or abscesses; images with ongoing consistent SBO pattern. - Continue to follow WBCs trend intermittently; patient is afebrile.    Status is: Inpatient   Subcu heparin  for DVT prophylaxis - Protonix  IV for GI prophylaxis  Disposition: The patient is from: Home              Anticipated d/c is to: Home              Anticipated d/c date is: > 3 days              Patient currently is not medically stable to d/c.  Barriers: Not Clinically Stable-   Code Status :  -  Code Status: Full Code   Family Communication:   (patient is alert, awake and coherent)  No family at bedside on today's evaluation.  DVT Prophylaxis  :   - SCDs   heparin  injection 5,000 Units Start: 08/10/23 1400 SCDs Start: 08/07/23 0437   Lab Results  Component Value Date   PLT 250 08/12/2023   Inpatient Medications  Scheduled Meds:  Chlorhexidine  Gluconate Cloth  6 each Topical Daily   feeding supplement  1 Container Oral TID BM   heparin  injection (subcutaneous)  5,000 Units Subcutaneous Q8H   insulin  aspart  0-15 Units Subcutaneous TID WC   metoprolol  tartrate  5 mg Intravenous Q4H   pantoprazole  (PROTONIX ) IV  40 mg Intravenous Q12H   sodium chloride  flush  10-40 mL Intracatheter Q12H   Continuous Infusions:  amiodarone  60 mg/hr (08/16/23 1100)   TPN ADULT (ION) 80 mL/hr at 08/16/23 1100   TPN ADULT (ION)     PRN Meds:.acetaminophen  **OR** acetaminophen , hydrALAZINE , HYDROmorphone  (DILAUDID ) injection, phenol, prochlorperazine , sodium chloride  flush   Anti-infectives (From admission, onward)    None       Subjective: Dorothy Harris no fever, no chest pain, no nausea, no vomiting.  Still reporting  intermittent abdominal discomfort.  At time of examination NG tube has been removed and patient was happier and overall expressing feeling better.  TPN and amiodarone  drip ongoing.  Objective: Vitals:   08/16/23 0900 08/16/23 0953 08/16/23 1030 08/16/23 1100  BP: 110/77 (!) 110/50 (!) 113/55 100/66  Pulse: (!) 117 (!) 132 98 99  Resp: 19 (!) 23 16 19   Temp:      TempSrc:      SpO2: 94% 95% 96% 96%  Weight:      Height:        Intake/Output Summary (Last 24 hours) at 08/16/2023 1134 Last data filed at 08/16/2023 1100 Gross per 24 hour  Intake 2719.28 ml  Output 1525 ml  Net 1194.28 ml   Filed Weights   08/14/23 0444 08/15/23 0500 08/16/23 0500  Weight: 95.4 kg 95.4 kg 97.9 kg    Physical Exam General exam: Alert, awake, oriented x 3; still reporting intermittent abdominal discomfort but expressed no nausea, no vomiting, no chest pain and no shortness of breath.  At time of examination NG tube has been removed. Respiratory system: Good saturation on room air.  Cardiovascular system: Irregular, no rubs, no gallops, no JVD on exam. Gastrointestinal system: Abdomen is soft, nontender, and just mildly distended.  Easily reducible parastomal hernia appreciated on exam.  Ileostomy in place with enteric contents in the bag.  Decreased blood present bowel sounds appreciated. Central nervous system: Moves all 4 limbs spontaneously.  No focal neurological deficits. Extremities: No sign diagnosis of clubbing; no edema. Skin: No petechiae. Psychiatry: Judgement and insight appear normal.  Flat affect appreciated on exam.  Data Reviewed: I have personally reviewed following labs and imaging studies  CBC: Recent Labs  Lab 08/10/23 0513 08/11/23 0510 08/12/23 0449  WBC 13.3* 13.3* 16.2*  HGB 11.3* 9.7* 10.1*  HCT 36.1 31.5* 33.6*  MCV 102.6* 104.3* 103.7*  PLT 305 253 250   Basic Metabolic Panel: Recent Labs  Lab 08/10/23 0503 08/11/23 0510 08/12/23 0449 08/13/23 0822  08/14/23 0637 08/15/23 0549 08/16/23 0623  NA 147*   < > 143 141 141 142 142  K 3.0*   < > 3.7 3.3* 3.5 4.0 4.0  CL 92*   < > 99 99 100 105 107  CO2 34*   < > 28 28 30 27 25   GLUCOSE 122*   < > 107* 126* 166* 188* 234*  BUN 96*   < > 70* 63* 57* 58* 68*  CREATININE 5.92*   < > 2.88* 2.60* 2.27* 2.06* 2.13*  CALCIUM  6.9*   < > 8.0* 8.0* 8.4* 8.0* 8.0*  MG 2.3  --  1.9  --  1.9 1.8  --   PHOS 5.7*  --  3.8  --  3.2 2.6 3.2   < > = values in this interval not displayed.   GFR: Estimated Creatinine Clearance: 32.9 mL/min (A) (by C-G formula based on SCr of 2.13 mg/dL (H)).  Liver Function Tests: Recent Labs  Lab 08/10/23 0503 08/12/23 0449 08/14/23 4098 08/15/23 0549 08/16/23 0623  AST  --   --  19  --   --   ALT  --   --  12  --   --   ALKPHOS  --   --  61  --   --   BILITOT  --   --  0.5  --   --   PROT  --   --  5.8*  --   --   ALBUMIN  3.2* 2.4* 2.3* 1.9* 1.9*   Radiology Studies: No results found.  Scheduled Meds:  Chlorhexidine  Gluconate Cloth  6 each Topical Daily   feeding supplement  1 Container Oral TID BM   heparin  injection (subcutaneous)  5,000 Units Subcutaneous Q8H   insulin  aspart  0-15 Units Subcutaneous TID WC   metoprolol  tartrate  5 mg Intravenous Q4H   pantoprazole  (PROTONIX ) IV  40 mg Intravenous Q12H   sodium chloride  flush  10-40 mL Intracatheter Q12H   Continuous Infusions:  amiodarone  60 mg/hr (08/16/23 1100)   TPN ADULT (ION) 80 mL/hr at 08/16/23 1100   TPN ADULT (ION)      LOS: 9 days   Dorothy Harris M.D on 08/16/2023 at 11:34 AM  CRITICAL CARE Performed by: Dorothy Harris   Total critical care time: 50 minutes  Critical care time was exclusive of separately billable procedures and treating other patients.  Critical care was necessary to treat or prevent imminent or life-threatening deterioration.  Critical care was time spent personally by me on the following activities: development of treatment plan with patient and/or surrogate  as well as nursing, discussions with  consultants, evaluation of patient's response to treatment, examination of patient, obtaining history from patient or surrogate, ordering and performing treatments and interventions, ordering and review of laboratory studies, ordering and review of radiographic studies, pulse oximetry and re-evaluation of patient's condition.   [facility] - Office  [phone]  If 7PM-7AM, please contact night-coverage [email or web address] 08/16/2023, 11:34 AM

## 2023-08-16 NOTE — Plan of Care (Signed)
°  Problem: Clinical Measurements: Goal: Cardiovascular complication will be avoided Outcome: Not Progressing   Problem: Activity: Goal: Risk for activity intolerance will decrease Outcome: Not Progressing   Problem: Nutrition: Goal: Adequate nutrition will be maintained Outcome: Not Progressing

## 2023-08-16 NOTE — Plan of Care (Signed)
  Problem: Education: Goal: Knowledge of General Education information will improve Description: Including pain rating scale, medication(s)/side effects and non-pharmacologic comfort measures Outcome: Progressing   Problem: Health Behavior/Discharge Planning: Goal: Ability to manage health-related needs will improve Outcome: Progressing   Problem: Clinical Measurements: Goal: Ability to maintain clinical measurements within normal limits will improve Outcome: Progressing Goal: Will remain free from infection Outcome: Progressing Goal: Diagnostic test results will improve Outcome: Progressing Goal: Respiratory complications will improve Outcome: Progressing Goal: Cardiovascular complication will be avoided Outcome: Progressing   Problem: Activity: Goal: Risk for activity intolerance will decrease Outcome: Not Progressing   Problem: Nutrition: Goal: Adequate nutrition will be maintained Outcome: Not Progressing   Problem: Coping: Goal: Level of anxiety will decrease Outcome: Progressing   Problem: Elimination: Goal: Will not experience complications related to bowel motility Outcome: Progressing Goal: Will not experience complications related to urinary retention Outcome: Progressing   Problem: Pain Managment: Goal: General experience of comfort will improve and/or be controlled Outcome: Not Progressing   Problem: Safety: Goal: Ability to remain free from injury will improve Outcome: Not Progressing   Problem: Skin Integrity: Goal: Risk for impaired skin integrity will decrease Outcome: Not Progressing

## 2023-08-16 NOTE — Progress Notes (Signed)
  Subjective: Patient denies any abdominal pain.  Objective: Vital signs in last 24 hours: Temp:  [98.8 F (37.1 C)-100.2 F (37.9 C)] 100.2 F (37.9 C) (05/11 0805) Pulse Rate:  [102-140] 115 (05/11 0400) Resp:  [17-28] 17 (05/11 0400) BP: (90-144)/(49-97) 113/56 (05/11 0400) SpO2:  [90 %-96 %] 93 % (05/11 0400) Weight:  [97.9 kg] 97.9 kg (05/11 0500) Last BM Date : 08/12/23  Intake/Output from previous day: 05/10 0701 - 05/11 0700 In: 2661.6 [I.V.:2661.6] Out: 1425 [Urine:1125; Emesis/NG output:150; Stool:150] Intake/Output this shift: No intake/output data recorded.  General appearance: alert, cooperative, and no distress GI: Soft, nontender, nondistended.  Easily reducible parastomal hernia.  She does have some prolapse of the ileostomy but there is enteric contents in the bag with some gas.  NG tube output marginal.  Lab Results:  No results for input(s): "WBC", "HGB", "HCT", "PLT" in the last 72 hours. BMET Recent Labs    08/15/23 0549 08/16/23 0623  NA 142 142  K 4.0 4.0  CL 105 107  CO2 27 25  GLUCOSE 188* 234*  BUN 58* 68*  CREATININE 2.06* 2.13*  CALCIUM  8.0* 8.0*   PT/INR No results for input(s): "LABPROT", "INR" in the last 72 hours.  Studies/Results: No results found.  Anti-infectives: Anti-infectives (From admission, onward)    None       Assessment/Plan: Impression: Small bowel obstruction with ventral hernia as well as a parastomal hernia which seems to be slowly resolving. Plan: Will trial p.o. intake and remove NG tube.  Will start full liquid diet as well as boost.  Patient aware that the NG tube may need to be replaced.  LOS: 9 days    Alanda Allegra 08/16/2023

## 2023-08-16 NOTE — Progress Notes (Signed)
 PHARMACY - TOTAL PARENTERAL NUTRITION CONSULT NOTE   Indication: Small bowel obstruction  Patient Measurements: Height: 5\' 7"  (170.2 cm) Weight: 97.9 kg (215 lb 13.3 oz) IBW/kg (Calculated) : 61.6 TPN AdjBW (KG): 69.4 Body mass index is 33.8 kg/m. Usual Weight:   Assessment:  Small bowel obstruction with ventral hernia and end ileostomy.  Glucose / Insulin : 179-217- 9 units insulin  given  Electrolytes: K 4.0 Corrected calcium  9.7 Renal: Scr 2.1 Hepatic: WNL  Central access: PICC TPN start date:  5/8  Nutritional Goals: Goal TPN rate is 80 mL/hr (provides 115 g of protein and 1920 kcals per day)  RD Assessment: Estimated Needs Total Energy Estimated Needs: 1900-2100 Total Protein Estimated Needs: 110-130 gm Total Fluid Estimated Needs: 1.9-2.1 L  Current Nutrition:  NPO  Plan:  Continue TPN at 80 mL/hr at 1800 Follow plan closely as we will trial diet today Electrolytes in TPN: Na 4mEq/L, K 9mEq/L, Ca 75mEq/L, Mg 15mEq/L, and Phos 15mmol/L. Cl:Ac 1:1 Add standard MVI and trace elements to TPN Increase to Moderate q8h SSI and adjust as needed  Monitor TPN labs on Mon/Thurs  Audra Blend PharmD., BCPS Clinical Pharmacist 08/16/2023 9:21 AM

## 2023-08-17 DIAGNOSIS — I4891 Unspecified atrial fibrillation: Secondary | ICD-10-CM | POA: Diagnosis present

## 2023-08-17 DIAGNOSIS — E66811 Obesity, class 1: Secondary | ICD-10-CM | POA: Diagnosis not present

## 2023-08-17 DIAGNOSIS — N179 Acute kidney failure, unspecified: Secondary | ICD-10-CM | POA: Diagnosis not present

## 2023-08-17 DIAGNOSIS — K56609 Unspecified intestinal obstruction, unspecified as to partial versus complete obstruction: Secondary | ICD-10-CM | POA: Diagnosis not present

## 2023-08-17 DIAGNOSIS — I480 Paroxysmal atrial fibrillation: Secondary | ICD-10-CM | POA: Diagnosis not present

## 2023-08-17 LAB — GLUCOSE, CAPILLARY
Glucose-Capillary: 187 mg/dL — ABNORMAL HIGH (ref 70–99)
Glucose-Capillary: 195 mg/dL — ABNORMAL HIGH (ref 70–99)
Glucose-Capillary: 170 mg/dL — ABNORMAL HIGH (ref 70–99)
Glucose-Capillary: 172 mg/dL — ABNORMAL HIGH (ref 70–99)

## 2023-08-17 LAB — COMPREHENSIVE METABOLIC PANEL WITH GFR
ALT: 12 U/L (ref 0–44)
AST: 17 U/L (ref 15–41)
Albumin: 1.8 g/dL — ABNORMAL LOW (ref 3.5–5.0)
Alkaline Phosphatase: 86 U/L (ref 38–126)
Anion gap: 11 (ref 5–15)
BUN: 71 mg/dL — ABNORMAL HIGH (ref 8–23)
CO2: 23 mmol/L (ref 22–32)
Calcium: 8 mg/dL — ABNORMAL LOW (ref 8.9–10.3)
Chloride: 106 mmol/L (ref 98–111)
Creatinine, Ser: 2.09 mg/dL — ABNORMAL HIGH (ref 0.44–1.00)
GFR, Estimated: 26 mL/min — ABNORMAL LOW (ref >60)
Glucose, Bld: 214 mg/dL — ABNORMAL HIGH (ref 70–99)
Potassium: 4.1 mmol/L (ref 3.5–5.1)
Sodium: 140 mmol/L (ref 135–145)
Total Bilirubin: 0.3 mg/dL (ref 0.0–1.2)
Total Protein: 5.1 g/dL — ABNORMAL LOW (ref 6.5–8.1)

## 2023-08-17 LAB — MAGNESIUM: Magnesium: 1.9 mg/dL (ref 1.7–2.4)

## 2023-08-17 LAB — TRIGLYCERIDES: Triglycerides: 218 mg/dL — ABNORMAL HIGH (ref <150)

## 2023-08-17 LAB — PHOSPHORUS: Phosphorus: 4.2 mg/dL (ref 2.5–4.6)

## 2023-08-17 MED ORDER — ENSURE ENLIVE PO LIQD
237.0000 mL | Freq: Three times a day (TID) | ORAL | Status: DC
  Administered 2023-08-18: 237 mL via ORAL

## 2023-08-17 MED ORDER — TRAVASOL 10 % IV SOLN
INTRAVENOUS | Status: AC
  Filled 2023-08-17: qty 1152

## 2023-08-17 NOTE — Progress Notes (Signed)
 PHARMACY - TOTAL PARENTERAL NUTRITION CONSULT NOTE   Indication: Small bowel obstruction  Patient Measurements: Height: 5\' 7"  (170.2 cm) Weight: 100.8 kg (222 lb 3.6 oz) IBW/kg (Calculated) : 61.6 TPN AdjBW (KG): 69.4 Body mass index is 34.81 kg/m. Usual Weight:   Assessment:  Small bowel obstruction with ventral hernia and end ileostomy.  Glucose / Insulin : 165-217 8 units insulin  given  Electrolytes: K 4.1 Corrected calcium  9.7 Renal: Scr 2.09 Hepatic: WNL  Central access: PICC TPN start date:  5/8  Nutritional Goals: Goal TPN rate is 80 mL/hr (provides 115 g of protein and 1920 kcals per day)  RD Assessment: Estimated Needs Total Energy Estimated Needs: 1900-2100 Total Protein Estimated Needs: 110-130 gm Total Fluid Estimated Needs: 1.9-2.1 L  Current Nutrition:  Liquid diet started 5/11 - patient received 2 supplements  Plan:  Continue TPN at 80 mL/hr at 1800 Plan on continuing TPN until patient able to tolerate regular diet Electrolytes in TPN: Na 43mEq/L, K 64mEq/L, Ca 63mEq/L, Mg 31mEq/L, and Phos 5 mmol/L. Cl:Ac 1:1 Add standard MVI and trace elements to TPN Increase to Moderate q8h SSI and adjust as needed  Monitor TPN labs on Mon/Thurs  Audra Blend PharmD., BCPS Clinical Pharmacist 08/17/2023 8:44 AM

## 2023-08-17 NOTE — Plan of Care (Signed)
   Problem: Education: Goal: Knowledge of General Education information will improve Description: Including pain rating scale, medication(s)/side effects and non-pharmacologic comfort measures Outcome: Progressing   Problem: Coping: Goal: Level of anxiety will decrease Outcome: Progressing   Problem: Safety: Goal: Ability to remain free from injury will improve Outcome: Progressing

## 2023-08-17 NOTE — Progress Notes (Signed)
 [facility] Progress Note     Subjective: NG removed over weekend and diet of full liquids started. No nausea complaints. She is having some output form her ostomy but this is very liquid thin.   She just received some pain medication and RN reports she complaints of pain at the lateral aspect of the parastomal hernia.   Objective: Vital signs in last 24 hours: Temp:  [97.6 F (36.4 C)-98.6 F (37 C)] 98.3 F (36.8 C) (05/11 2300) Pulse Rate:  [41-132] 83 (05/12 0600) Resp:  [13-26] 13 (05/12 0600) BP: (84-143)/(48-99) 114/61 (05/12 0600) SpO2:  [93 %-99 %] 97 % (05/12 0600) Weight:  [100.8 kg] 100.8 kg (05/12 0500) Last BM Date : 08/16/23  Intake/Output from previous day: 05/11 0701 - 05/12 0700 In: 1577.3 [I.V.:1577.3] Out: 950 [Urine:650; Emesis/NG output:250; Stool:50] Intake/Output this shift: No intake/output data recorded.  General appearance: alert and no distress Resp: normal work of breathing GI: soft, soft, lets me reduce the parastomal and ventral hernia, ostomy with prolapse reduced, some minor mucosa trauma noted with a streak of bloody mucus from the edge of the prolapse   Lab Results:  No results for input(s): "WBC", "HGB", "HCT", "PLT" in the last 72 hours. BMET Recent Labs    08/16/23 0623 08/17/23 0548  NA 142 140  K 4.0 4.1  CL 107 106  CO2 25 23  GLUCOSE 234* 214*  BUN 68* 71*  CREATININE 2.13* 2.09*  CALCIUM  8.0* 8.0*   PT/INR No results for input(s): "LABPROT", "INR" in the last 72 hours.  Studies/Results: No results found.  Anti-infectives: Anti-infectives (From admission, onward)    None       Assessment/Plan: Patient with SBO in the setting of a ventral hernia and parastomal hernia and end ileostomy. She has started having output and gas. Says she is not having any nausea. Abd exam has been soft and hernias reduced but still with some pain and requiring pain medication, output from ostomy is thin but starting  to produce some   TPN for now until taking more in Ambulate and OOB Cardiology deemed high risk for surgery, trying to avoid  Full liquid diet continue for now Have updated Anesthesia about patient pending any issues, if she did need surgery, I think she will be better served at a facility with more cardiology coverage and transfer would be best  Updated team.    LOS: 10 days    Awilda Bogus 08/17/2023

## 2023-08-17 NOTE — Progress Notes (Addendum)
 Nutrition Follow-up  DOCUMENTATION CODES:   Not applicable  INTERVENTION:   D/C Boost Breeze, patient dislikes. Try Ensure Enlive po TID, each supplement provides 350 kcal and 20 grams of protein. Continue TPN to meet nutrition needs until PO intake is adequate.   NUTRITION DIAGNOSIS:   Inadequate oral intake related to altered GI function as evidenced by NPO status.  Ongoing   GOAL:   Patient will meet greater than or equal to 90% of their needs  Met with TPN  MONITOR:   Diet advancement, I & O's  REASON FOR ASSESSMENT:   Consult New TPN/TNA  ASSESSMENT:   63 yo female admitted with SBO associated with ventral hernia, AKI. 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  Patient deemed high cardiac risk for surgery, so medical team is trying to avoid surgery. She started having ileostomy output and gas over the weekend. NG tube was removed and diet advanced to full liquids on 5/11.Meal intakes not recorded. Boost Breeze was also ordered, but patient is not drinking it because it is too sweet. Will try Ensure supplements instead.  TPN at 80 ml/h is providing 1920 kcal and 115 gm protein daily (100% of estimated needs).   Labs reviewed.  CBG: 187-195  Medications reviewed and include novolog .  Admit weight: 89 kg Current weight 100.8 kg  Ileostomy output 50 ml x 24 hours Emesis 250 ml x 24 hours UOP 650 ml x 24 hours  Diet Order:   Diet Order             Diet full liquid Fluid consistency: Thin  Diet effective now                   EDUCATION NEEDS:   No education needs have been identified at this time  Skin:  Skin Assessment: Reviewed RN Assessment  Last BM:  5/12 ileostomy  Height:   Ht Readings from Last 1 Encounters:  08/12/23 5\' 7"  (1.702 m)    Weight:   Wt Readings from Last 1 Encounters:  08/17/23 100.8 kg    Ideal Body Weight:  61.4 kg  BMI:  Body mass index is 34.81  kg/m.  Estimated Nutritional Needs:   Kcal:  1900-2100  Protein:  110-130 gm  Fluid:  1.9-2.1 L   Barnet Boots RD, LDN, CNSC Contact via secure chat. If unavailable, use group chat "RD Inpatient."

## 2023-08-17 NOTE — Progress Notes (Signed)
 Rounding Note    Patient Name: Dorothy Harris Date of Encounter: 08/17/2023  [facility] Cardiologist: Gaylyn Keas, MD   Subjective   No complaints.   Inpatient Medications    Scheduled Meds:  Chlorhexidine  Gluconate Cloth  6 each Topical Daily   feeding supplement  1 Container Oral TID BM   heparin  injection (subcutaneous)  5,000 Units Subcutaneous Q8H   insulin  aspart  0-15 Units Subcutaneous TID WC   metoprolol  tartrate  5 mg Intravenous Q4H   pantoprazole  (PROTONIX ) IV  40 mg Intravenous Q12H   sodium chloride  flush  10-40 mL Intracatheter Q12H   Continuous Infusions:  amiodarone  60 mg/hr (08/17/23 0511)   TPN ADULT (ION) 80 mL/hr at 08/16/23 1900   TPN ADULT (ION)     PRN Meds: acetaminophen  **OR** acetaminophen , hydrALAZINE , HYDROmorphone  (DILAUDID ) injection, phenol, prochlorperazine , sodium chloride  flush   Vital Signs    Vitals:   08/17/23 0300 08/17/23 0400 08/17/23 0500 08/17/23 0600  BP: 131/77 122/83 (!) 121/56 114/61  Pulse: 90 (!) 125 84 83  Resp: 19 20 13 13   Temp:      TempSrc:      SpO2: 96% 97% 95% 97%  Weight:   100.8 kg   Height:        Intake/Output Summary (Last 24 hours) at 08/17/2023 0823 Last data filed at 08/16/2023 2322 Gross per 24 hour  Intake 1248.8 ml  Output 950 ml  Net 298.8 ml      08/17/2023    5:00 AM 08/16/2023    5:00 AM 08/15/2023    5:00 AM  Last 3 Weights  Weight (lbs) 222 lb 3.6 oz 215 lb 13.3 oz 210 lb 5.1 oz  Weight (kg) 100.8 kg 97.9 kg 95.4 kg      Telemetry    Afib 90s - Personally Reviewed  ECG    N/a - Personally Reviewed  Physical Exam   GEN: No acute distress.   Neck: No JVD Cardiac:irreg Respiratory: Clear to auscultation bilaterally. GI: Soft, nontender, non-distended  MS: No edema; No deformity. Neuro:  Nonfocal  Psych: Normal affect   Labs    High Sensitivity Troponin:   Recent Labs  Lab 08/06/23 2148 08/06/23 2312 08/10/23 0513 08/10/23 0838  TROPONINIHS 93* 86*  214* 206*     Chemistry Recent Labs  Lab 08/14/23 0981 08/15/23 0549 08/16/23 0623 08/17/23 0548  NA 141 142 142 140  K 3.5 4.0 4.0 4.1  CL 100 105 107 106  CO2 30 27 25 23   GLUCOSE 166* 188* 234* 214*  BUN 57* 58* 68* 71*  CREATININE 2.27* 2.06* 2.13* 2.09*  CALCIUM  8.4* 8.0* 8.0* 8.0*  MG 1.9 1.8  --  1.9  PROT 5.8*  --   --  5.1*  ALBUMIN  2.3* 1.9* 1.9* 1.8*  AST 19  --   --  17  ALT 12  --   --  12  ALKPHOS 61  --   --  86  BILITOT 0.5  --   --  0.3  GFRNONAA 24* 27* 26* 26*  ANIONGAP 11 10 10 11     Lipids  Recent Labs  Lab 08/14/23 0637  TRIG 169*    Hematology Recent Labs  Lab 08/11/23 0510 08/12/23 0449  WBC 13.3* 16.2*  RBC 3.02* 3.24*  HGB 9.7* 10.1*  HCT 31.5* 33.6*  MCV 104.3* 103.7*  MCH 32.1 31.2  MCHC 30.8 30.1  RDW 15.1 15.1  PLT 253 250   Thyroid No  results for input(s): "TSH", "FREET4" in the last 168 hours.  BNPNo results for input(s): "BNP", "PROBNP" in the last 168 hours.  DDimer No results for input(s): "DDIMER" in the last 168 hours.   Radiology    No results found.  Cardiac Studies     Assessment & Plan    1.Afib - history of postop afib in 2022, had not been a long term issue - new diagnosis this admission, issues with afib with RVR - management complicated by low bp's, started on amiodarone . - on amio gtt, when stable with PO intake from SBO standpoint can transition to oral - no anticoag at this time given chances for surgical intervention  - appears amio increased to 60mg /hr for increased rates, try again to lower to 30mg /hr. Rates 110 or less would be reasonable.  -08/2023 echo: LVEF 60-65%, no WMAs, severe asymmetric LVH, severe LAE,   2. LVH - 08/2023 echo LVH with septum 1.8 cm, posterior wall 1.3 cm   3. 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 Myles Arvin at [facility] vascular surgery, patient refused surgery due to risk of paraplegia   4. SBO - per surgery and primary team - conservative management thus far - NG tube removed, trying PO intake.   5. AKI - Cr 4.59 on admission, peaked at 8 - trending down back to baseline around 2 - nephrology following   6. Elevated troponin - suspect demand ischemia in setting of afib with RVR, SBO, severe AKI - -08/2023 echo: LVEF 60-65%, no WMAs, severe asymmetric LVH, severe LAE,  - no plans for ischemic testing.   We will follow telemetry and see patient peripherally tomorrow   For questions or updates, please contact [facility] Please consult [email or web address] for contact info under        Signed, Armida Lander, MD  08/17/2023, 8:23 AM

## 2023-08-17 NOTE — Progress Notes (Signed)
 PROGRESS NOTE  Welcome Dorothy Harris, is a 63 y.o. 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 - General Surgery consult from Dr. Collene Dawson appreciated --- the hernia appears to be reducible per general surgeon - So far tolerating NG tube removal; having positive bowel sounds and liquid material inside her ileostomy bag.  Patient is denying nausea or vomiting and is placed just very mild intermittent abdominal discomfort. - Continue supportive care, electrolyte repletion and as needed analgesics/antiemetics. -Per general surgery team there is a possibility of surgical intervention; if patient failed ongoing conservative management (last option given high risk). -TPN has been started; pharmacy to assist with rate and levels. -continue to follow electrolytes. -patient remains high risk for any surgical intervention. - Continue current conservative management and follow general surgery recommendations.  2) AKI----acute kidney injury on CKD stage IV -due to dehydration/volume depletion in a patient with SBO and n.p.o. status with high output from NG tube -creatinine on admission= 4.59,  baseline creatinine = 2.0  to 2.2  ,  -creatinine peaked at 8.07 (on 08/09/23) -Continue trending down---currently 2.09 with ongoing hydration. - Nephrology consultation appreciated - No metabolic acidosis and no hyperkalemia at this time, elevated anion gap noted - patient is volume depleted, -continue to maintain adequate hydration, follow nephrology rec's -Renally adjust medications, avoid nephrotoxic agents / hypotension  Intake/Output Summary (Last 24 hours) at 08/17/2023 1421 Last data filed at 08/17/2023 1220 Gross per 24 hour  Intake 560.74 ml  Output 800 ml  Net -239.26 ml    3) atypical chest pains with  elevated Troponin: -EKG nonacute suspect demand ischemia due to AKI and  bowel obstruction.  Troponin 214 >>206-- echo requested - Lower extremity Dopplers and VQ scan negative for DVT and PE respectively 08/11/23 - Patient remains at high risk for any surgical intervention. - Continue rate control with the use of IV amiodarone  - No ACS concerns per cardiology recommendations; 2D echo reassuring and demonstrating preserved ejection fraction. -continue holding B-blocker at the moment, due to soft BP.   4)Essential hypertension: BP on the soft sides and will require pressors.  - c/n to Hold PTA amlodipine  and oral hydralazine   - Continue to follow VS   5)Paroxysmal A-fib: -PTA amiodarone  and Coreg  on hold due to n.p.o. status -Patient was not on anticoagulation PTA -follow echo results - Continue IV amiodarone  drip; intermittent boluses will be provided to assist with rate control.[name]   6)HFpEF w/ tte 8/24-EF 65 to 70%, G1DD: Currently volume depleted needing IV fluids due to SBO.   - Be judicious with IV fluids - follow Echo results -continue to follow I's and O's  7)HLD - Continue to hold PTA atorvastatin  while n.p.o.  8)Hypokalemia -Due to  GI losses - Continue to follow trend and replete as needed. -Magnesium  WNL - Keep potassium around 4 especially in view of arrhythmias. -plan is for  Mg around 2.  9)Hypernatremia--- due to dehydration,  - Creatinine is down to 144 from 147 continue fluids as ordered  10)acute urinary retention -in and out cath X 3 without success of voiding spontaneously; foley catheter placed and good urine output appreciated. - Creatinine has further improved and back to her baseline at the moment. - No suprapubic tenderness reported.  11)leukocytosis and abd pain - Continue to hold antibiotics at the moment - CT chest no demonstrating perforation or abscesses; images with ongoing consistent SBO pattern. - Continue to follow WBCs trend intermittently; patient is afebrile.    Status is: Inpatient   Subcu heparin  for DVT prophylaxis - Protonix  IV for GI prophylaxis  Disposition: The patient is from: Home              Anticipated d/c is to: Home              Anticipated d/c date is: > 3 days              Patient currently is not medically stable to d/c.  Barriers: Not Clinically Stable-   Code Status :  -  Code Status: Full Code   Family Communication:   (patient is alert, awake and coherent)  No family at bedside on today's evaluation.  DVT Prophylaxis  :   - SCDs   heparin  injection 5,000 Units Start: 08/10/23 1400 SCDs Start: 08/07/23 0437   Lab Results  Component Value Date   PLT 250 08/12/2023   Inpatient Medications  Scheduled Meds:  Chlorhexidine  Gluconate Cloth  6 each Topical Daily   feeding supplement  1 Container Oral TID BM   heparin  injection (subcutaneous)  5,000 Units Subcutaneous Q8H   insulin  aspart  0-15 Units Subcutaneous TID WC   metoprolol  tartrate  5 mg Intravenous Q4H   pantoprazole  (PROTONIX ) IV  40 mg Intravenous Q12H   sodium chloride  flush  10-40 mL Intracatheter Q12H   Continuous Infusions:  amiodarone  30 mg/hr (08/17/23 1000)   TPN ADULT (ION) 80 mL/hr at 08/16/23 1900   TPN ADULT (ION)     PRN Meds:.acetaminophen  **OR** acetaminophen , hydrALAZINE , HYDROmorphone  (DILAUDID ) injection, phenol,  prochlorperazine , sodium chloride  flush   Anti-infectives (From admission, onward)    None       Subjective: Carter Dudzinski no fever, no chest pain, no nausea, no vomiting, no palpitations.  So far tolerating full liquid diet and demonstrating positive liquid material in her ileostomy bag.  Objective: Vitals:   08/17/23 0859 08/17/23 0900 08/17/23 1000 08/17/23 1204  BP:  (!) 108/57 121/73   Pulse:  91 (!) 102   Resp:  14 18   Temp: 98.4 F (36.9 C)   98.6 F (37 C)  TempSrc: Oral   Oral  SpO2:  95% 97%   Weight:      Height:        Intake/Output Summary (Last 24 hours) at 08/17/2023 1421 Last data filed at 08/17/2023 1220 Gross per 24 hour  Intake 560.74 ml  Output 800 ml  Net -239.26 ml   Filed Weights   08/15/23 0500 08/16/23 0500 08/17/23 0500  Weight: 95.4 kg 97.9 kg 100.8 kg    Physical Exam General exam: Alert, awake, oriented x 3; good saturation on room air; no fever, reports no nausea and so far has tolerated full liquid diet.  Patient expressed mild abdominal discomfort intermittently present. Respiratory system: No using accessory muscles; good saturation on room air. Cardiovascular system: Rate controlled, no rubs, no gallops, no JVD. Gastrointestinal system: Abdomen is soft, positive bowel sounds appreciated on exam; ileostomy in place with son liquid material. Central nervous system: Moving 4 limbs spontaneously.  No focal neurological deficits. Extremities: No cyanosis or clubbing. Skin: No petechiae. Psychiatry: Judgement and insight appear normal. Mood & affect appropriate.   Data Reviewed: I have personally reviewed following labs and imaging studies  CBC: Recent Labs  Lab 08/11/23 0510 08/12/23 0449  WBC 13.3* 16.2*  HGB 9.7* 10.1*  HCT 31.5* 33.6*  MCV 104.3* 103.7*  PLT 253 250   Basic Metabolic Panel: Recent Labs  Lab 08/12/23 0449 08/13/23 0822 08/14/23 0637 08/15/23 0549 08/16/23 0623 08/17/23 0548  NA 143 141 141 142 142 140   K 3.7 3.3* 3.5 4.0 4.0 4.1  CL 99 99 100 105 107 106  CO2 28 28 30 27 25 23   GLUCOSE 107* 126* 166* 188* 234* 214*  BUN 70* 63* 57* 58* 68* 71*  CREATININE 2.88* 2.60* 2.27* 2.06* 2.13* 2.09*  CALCIUM  8.0* 8.0* 8.4* 8.0* 8.0* 8.0*  MG 1.9  --  1.9 1.8  --  1.9  PHOS 3.8  --  3.2 2.6 3.2 4.2   GFR: Estimated Creatinine Clearance: 34.1 mL/min (A) (by C-G formula based on SCr of 2.09 mg/dL (H)).  Liver Function Tests: Recent Labs  Lab 08/12/23 0449 08/14/23 0637 08/15/23 0549 08/16/23 0623 08/17/23 0548  AST  --  19  --   --  17  ALT  --  12  --   --  12  ALKPHOS  --  61  --   --  86  BILITOT  --  0.5  --   --  0.3  PROT  --  5.8*  --   --  5.1*  ALBUMIN  2.4* 2.3* 1.9* 1.9* 1.8*   Radiology Studies: No results found.  Scheduled Meds:  Chlorhexidine  Gluconate Cloth  6 each Topical Daily   feeding supplement  1 Container Oral TID BM   heparin  injection (subcutaneous)  5,000 Units Subcutaneous Q8H   insulin  aspart  0-15 Units Subcutaneous TID WC   metoprolol  tartrate  5 mg Intravenous Q4H   pantoprazole  (PROTONIX ) IV  40 mg Intravenous Q12H   sodium chloride  flush  10-40 mL Intracatheter Q12H   Continuous Infusions:  amiodarone  30 mg/hr (08/17/23 1000)   TPN ADULT (ION) 80 mL/hr at 08/16/23 1900   TPN ADULT (ION)      LOS: 10 days   Justina Oman M.D on 08/17/2023 at 2:21 PM  CRITICAL CARE Performed by: Justina Oman   Total critical care time: 50 minutes  Critical care time was exclusive of separately billable procedures and treating other patients.  Critical care was necessary to treat or prevent imminent or life-threatening deterioration.  Critical care was time spent personally by me on the following activities: development of treatment plan with patient and/or surrogate as well as nursing, discussions with consultants, evaluation of patient's response to treatment, examination of patient, obtaining history from patient or surrogate, ordering and performing  treatments and interventions, ordering and review of laboratory studies, ordering and review of radiographic studies, pulse oximetry and re-evaluation of patient's condition.   [facility] - Office  [phone]  If 7PM-7AM, please contact night-coverage [email or web address] 08/17/2023, 2:21 PM

## 2023-08-17 NOTE — Plan of Care (Signed)
  Problem: Health Behavior/Discharge Planning: Goal: Ability to manage health-related needs will improve Outcome: Progressing   Problem: Clinical Measurements: Goal: Ability to maintain clinical measurements within normal limits will improve Outcome: Progressing Goal: Will remain free from infection Outcome: Progressing Goal: Diagnostic test results will improve Outcome: Progressing Goal: Respiratory complications will improve Outcome: Progressing Goal: Cardiovascular complication will be avoided Outcome: Progressing   Problem: Activity: Goal: Risk for activity intolerance will decrease Outcome: Progressing   Problem: Nutrition: Goal: Adequate nutrition will be maintained Outcome: Progressing   Problem: Coping: Goal: Level of anxiety will decrease Outcome: Progressing   Problem: Elimination: Goal: Will not experience complications related to bowel motility Outcome: Progressing   

## 2023-08-18 ENCOUNTER — Inpatient Hospital Stay (HOSPITAL_COMMUNITY)

## 2023-08-18 DIAGNOSIS — K56609 Unspecified intestinal obstruction, unspecified as to partial versus complete obstruction: Secondary | ICD-10-CM | POA: Diagnosis not present

## 2023-08-18 DIAGNOSIS — I10 Essential (primary) hypertension: Secondary | ICD-10-CM | POA: Diagnosis not present

## 2023-08-18 DIAGNOSIS — I480 Paroxysmal atrial fibrillation: Secondary | ICD-10-CM | POA: Diagnosis not present

## 2023-08-18 DIAGNOSIS — E66811 Obesity, class 1: Secondary | ICD-10-CM | POA: Diagnosis not present

## 2023-08-18 LAB — RENAL FUNCTION PANEL
Albumin: 1.7 g/dL — ABNORMAL LOW (ref 3.5–5.0)
Anion gap: 8 (ref 5–15)
BUN: 77 mg/dL — ABNORMAL HIGH (ref 8–23)
CO2: 25 mmol/L (ref 22–32)
Calcium: 8.2 mg/dL — ABNORMAL LOW (ref 8.9–10.3)
Chloride: 108 mmol/L (ref 98–111)
Creatinine, Ser: 2.25 mg/dL — ABNORMAL HIGH (ref 0.44–1.00)
GFR, Estimated: 24 mL/min — ABNORMAL LOW (ref >60)
Glucose, Bld: 174 mg/dL — ABNORMAL HIGH (ref 70–99)
Phosphorus: 3.8 mg/dL (ref 2.5–4.6)
Potassium: 4.3 mmol/L (ref 3.5–5.1)
Sodium: 141 mmol/L (ref 135–145)

## 2023-08-18 LAB — GLUCOSE, CAPILLARY
Glucose-Capillary: 164 mg/dL — ABNORMAL HIGH (ref 70–99)
Glucose-Capillary: 173 mg/dL — ABNORMAL HIGH (ref 70–99)
Glucose-Capillary: 184 mg/dL — ABNORMAL HIGH (ref 70–99)
Glucose-Capillary: 187 mg/dL — ABNORMAL HIGH (ref 70–99)

## 2023-08-18 MED ORDER — TRAVASOL 10 % IV SOLN
INTRAVENOUS | Status: AC
  Filled 2023-08-18: qty 1152
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
## 2023-08-18 NOTE — Progress Notes (Signed)
 Talked to patient about the importance of NGT until before her surgery to relieve with her symptoms and also to help her decompress, refusing, patient is very adamant about not getting the NGT. still nauseous, no active vomiting until now, gave her PRN Compazine , will continue to monitor and endorse.

## 2023-08-18 NOTE — Progress Notes (Signed)
 PT Cancellation Note  Patient Details Name: SOVEREIGN BRANSTETTER MRN: [id number] DOB: Apr 10, 1960   Cancelled Treatment:    Reason Eval/Treat Not Completed: Patient declined, no reason specified. Patient declined to get up due to c/o nausea - nurse notified.   2:50 PM, 08/18/23 Walton Guppy, MPT Physical Therapist with [facility] [phone] office [phone] mobile phone

## 2023-08-18 NOTE — Progress Notes (Signed)
 [facility]  Patient refusing NG. RN discussing need and risk. Will need to be strict NPO.  Deena Farrier MD
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
## 2023-08-18 NOTE — Progress Notes (Signed)
 Pt. Vomited. Compazine  given. Explained again importance of NG tube and how it would help. Pt. Still refusing.

## 2023-08-18 NOTE — Progress Notes (Signed)
 PHARMACY - TOTAL PARENTERAL NUTRITION CONSULT NOTE   Indication: Small bowel obstruction  Patient Measurements: Height: 5\' 7"  (170.2 cm) Weight: 99.9 kg (220 lb 3.8 oz) IBW/kg (Calculated) : 61.6 TPN AdjBW (KG): 69.4 Body mass index is 34.49 kg/m. Usual Weight:   Assessment:  Small bowel obstruction with ventral hernia and end ileostomy.  Glucose / Insulin : 170-195 11 units insulin  given  Electrolytes: K 4.1 Corrected calcium  9.7 Renal: Scr 2.09 Hepatic: WNL  Central access: PICC TPN start date:  5/8  Nutritional Goals: Goal TPN rate is 80 mL/hr (provides 115 g of protein and 1920 kcals per day)  RD Assessment: Estimated Needs Total Energy Estimated Needs: 1900-2100 Total Protein Estimated Needs: 110-130 gm Total Fluid Estimated Needs: 1.9-2.1 L  Current Nutrition:  Liquid diet started 5/11   Plan:  Continue TPN at 80 mL/hr at 1800 Plan on continuing TPN until patient able to tolerate regular diet Electrolytes in TPN: Na 30mEq/L, K 8mEq/L, Ca 36mEq/L, Mg 29mEq/L, and Phos 5 mmol/L. Cl:Ac 1:1 Add standard MVI and trace elements to TPN Increase to Moderate q8h SSI and adjust as needed  Monitor TPN labs on Mon/Thurs  Audra Blend PharmD., BCPS Clinical Pharmacist 08/18/2023 7:50 AM

## 2023-08-18 NOTE — Progress Notes (Signed)
 Nutrition Follow-up  DOCUMENTATION CODES:   Not applicable  INTERVENTION:   D/C Ensure Enlive while patient is NPO. Continue TPN to meet 100% of nutrition needs.   NUTRITION DIAGNOSIS:   Inadequate oral intake related to altered GI function as evidenced by NPO status.  Ongoing   GOAL:   Patient will meet greater than or equal to 90% of their needs  Met with TPN  MONITOR:   Diet advancement, I & O's  REASON FOR ASSESSMENT:   Consult New TPN/TNA  ASSESSMENT:   63 yo female admitted with SBO associated with ventral hernia, AKI. 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  Patient states that she likes Ensure, but she is not being allowed anything to eat or drink now.  KUB this morning showed dilated bowel loops consistent with obstruction. Patient refused NGT replacement. She has been made NPO. Plans for surgery on Thursday since obstruction is not progressing.   TPN at 80 ml/h is providing 1920 kcal and 115 gm protein daily (100% of estimated needs).   Labs reviewed.  CBG: 187-184  Medications reviewed and include novolog .  Admit weight: 89 kg Current weight 99.9 kg  Ileostomy output 100 ml x 24 hours UOP 1,000 ml x 24 hours  Diet Order:   Diet Order             Diet NPO time specified  Diet effective now                   EDUCATION NEEDS:   No education needs have been identified at this time  Skin:  Skin Assessment: Reviewed RN Assessment  Last BM:  5/13 ileostomy  Height:   Ht Readings from Last 1 Encounters:  08/12/23 5\' 7"  (1.702 m)    Weight:   Wt Readings from Last 1 Encounters:  08/18/23 99.9 kg    Ideal Body Weight:  61.4 kg  BMI:  Body mass index is 34.49 kg/m.  Estimated Nutritional Needs:   Kcal:  1900-2100  Protein:  110-130 gm  Fluid:  1.9-2.1 L   Barnet Boots RD, LDN, CNSC Contact via secure chat. If unavailable, use group chat "RD Inpatient."

## 2023-08-18 NOTE — Progress Notes (Signed)
 PROGRESS NOTE  Dorothy Harris, is a 63 y.o. 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 - General Surgery consult from Dr. Collene Dawson appreciated --- the hernia appears to be reducible per general surgeon - Patient experiencing nausea/vomiting, KUB demonstrating ongoing SBO. - Continue supportive care, electrolyte repletion and as needed analgesics/antiemetics. -Per general surgery team there is a possibility of surgical intervention in the next 24 to 48 hours. -TPN has been started; pharmacy to assist with rate and levels. -continue to follow electrolytes. -patient remains high risk for any surgical intervention. - Continue current conservative management and follow general surgery recommendations.  2) AKI----acute kidney injury on CKD stage IV -due to dehydration/volume depletion in a patient with SBO and n.p.o. status with high output from NG tube -creatinine on admission= 4.59,  baseline creatinine = 2.0 to 2.2  ,  -creatinine peaked at 8.07 (on 08/09/23) -Continue trending down---currently 2.09>> 2.25 - Nephrology consultation appreciated - No metabolic acidosis and no hyperkalemia at this time,  elevated anion gap noted - patient is volume depleted, -continue to maintain adequate hydration, follow nephrology rec's -Renally adjust medications, avoid nephrotoxic agents / hypotension  Intake/Output Summary (Last 24 hours) at 08/18/2023 1757 Last data filed at 08/18/2023 1539 Gross per 24 hour  Intake 3487.3 ml  Output 1150 ml  Net 2337.3 ml    3) atypical chest pains with  elevated Troponin: -EKG nonacute suspect demand ischemia due to AKI and  bowel obstruction.  Troponin 214 >>206-- echo requested - Lower extremity Dopplers and VQ scan negative for DVT and PE respectively 08/11/23 - Patient remains at high risk for any surgical intervention. - Continue rate control with the use of IV amiodarone  - No ACS concerns per cardiology recommendations; 2D echo reassuring and demonstrating preserved ejection fraction. -continue holding B-blocker at the moment, due to soft BP.   4)Essential hypertension: BP on the soft sides and will require pressors.  - c/n to Hold PTA amlodipine  and oral hydralazine   - Continue to follow VS   5)Paroxysmal A-fib: -PTA amiodarone  and Coreg  on hold due to n.p.o. status -Patient was not on anticoagulation PTA -follow echo results - Continue IV amiodarone  drip; intermittent boluses will be provided to assist with rate control.[name]   6)HFpEF w/ tte 8/24-EF 65 to 70%, G1DD: Currently volume depleted needing IV fluids due to SBO.   - Be judicious with IV fluids - follow Echo results -continue to follow I's and O's  7)HLD - Continue to hold PTA atorvastatin  while n.p.o.  8)Hypokalemia -Due to GI losses - Continue to follow trend and replete as needed. -Magnesium  WNL - Keep potassium around 4 especially in view of arrhythmias. -plan is for Mg around 2.  9)Hypernatremia--- due  to dehydration,  - Creatinine is down to 144 from 147 continue fluids as ordered  10)acute urinary retention -in and out cath X 3 without success of voiding spontaneously;  foley catheter placed and good urine output appreciated. - Creatinine has further improved and back to her baseline at the moment. - No suprapubic tenderness reported.  11)leukocytosis and abd pain - Continue to hold antibiotics at the moment - CT chest no demonstrating perforation or abscesses; images with ongoing consistent SBO pattern. - Continue to follow WBCs trend intermittently; patient is afebrile.    Status is: Inpatient   Subcu heparin  for DVT prophylaxis - Protonix  IV for GI prophylaxis  Disposition: The patient is from: Home              Anticipated d/c is to: Home              Anticipated d/c date is: > 3 days              Patient currently is not medically stable to d/c.  Barriers: Not Clinically Stable-   Code Status :  -  Code Status: Full Code   Family Communication:   (patient is alert, awake and coherent)  No family at bedside on today's evaluation.  DVT Prophylaxis  :   - SCDs   heparin  injection 5,000 Units Start: 08/10/23 1400 SCDs Start: 08/07/23 0437   Lab Results  Component Value Date   PLT 250 08/12/2023   Inpatient Medications  Scheduled Meds:  Chlorhexidine  Gluconate Cloth  6 each Topical Daily   heparin  injection (subcutaneous)  5,000 Units Subcutaneous Q8H   insulin  aspart  0-15 Units Subcutaneous TID WC   metoprolol  tartrate  5 mg Intravenous Q4H   pantoprazole  (PROTONIX ) IV  40 mg Intravenous Q12H   sodium chloride  flush  10-40 mL Intracatheter Q12H   Continuous Infusions:  amiodarone  30 mg/hr (08/18/23 1249)   TPN ADULT (ION) 80 mL/hr at 08/18/23 0450   TPN ADULT (ION)     PRN Meds:.acetaminophen  **OR** acetaminophen , hydrALAZINE , HYDROmorphone  (DILAUDID ) injection, phenol, prochlorperazine , sodium chloride  flush   Anti-infectives (From admission, onward)    None       Subjective: Dorothy Harris no fever, no chest pain, no shortness of breath.  Intermittent nausea/vomiting appreciated.  Minimal output in her ileostomy  bag.  Objective: Vitals:   08/18/23 1400 08/18/23 1500 08/18/23 1539 08/18/23 1600  BP: 131/76 126/79  137/74  Pulse: 84 93 98 99  Resp: 14 14 18 20   Temp:   98.2 F (36.8 C)   TempSrc:   Oral   SpO2: 95% 96% 96% 96%  Weight:      Height:        Intake/Output Summary (Last 24 hours) at 08/18/2023 1757 Last data filed at 08/18/2023 1539 Gross per 24 hour  Intake 3487.3 ml  Output 1150 ml  Net 2337.3 ml   Filed Weights   08/16/23 0500 08/17/23 0500 08/18/23 0500  Weight: 97.9 kg 100.8 kg 99.9 kg    Physical Exam General exam: Alert, awake, oriented x 3; good saturation on room air; no fever, no chest pain.  Experiencing intermittent vomiting abdominal distention. Respiratory system: Clear to auscultation. Respiratory effort normal.  No using accessory muscle. Cardiovascular system: Irregular rhythm; no rubs, no gallops. Gastrointestinal system: Abdomen is mildly distended, soft, without guarding.  Ileostomy bag in place with just minimal output. Central nervous system: No focal neurological deficits. Extremities: No cyanosis or clubbing. Skin: No petechiae. Psychiatry: Judgement and  insight appear normal.  Flat affect appreciated on exam.  Data Reviewed: I have personally reviewed following labs and imaging studies  CBC: Recent Labs  Lab 08/12/23 0449  WBC 16.2*  HGB 10.1*  HCT 33.6*  MCV 103.7*  PLT 250   Basic Metabolic Panel: Recent Labs  Lab 08/12/23 0449 08/13/23 0822 08/14/23 0637 08/15/23 0549 08/16/23 0623 08/17/23 0548 08/18/23 0535  NA 143   < > 141 142 142 140 141  K 3.7   < > 3.5 4.0 4.0 4.1 4.3  CL 99   < > 100 105 107 106 108  CO2 28   < > 30 27 25 23 25   GLUCOSE 107*   < > 166* 188* 234* 214* 174*  BUN 70*   < > 57* 58* 68* 71* 77*  CREATININE 2.88*   < > 2.27* 2.06* 2.13* 2.09* 2.25*  CALCIUM  8.0*   < > 8.4* 8.0* 8.0* 8.0* 8.2*  MG 1.9  --  1.9 1.8  --  1.9  --   PHOS 3.8  --  3.2 2.6 3.2 4.2 3.8   < > = values in this interval not  displayed.   GFR: Estimated Creatinine Clearance: 31.5 mL/min (A) (by C-G formula based on SCr of 2.25 mg/dL (H)).  Liver Function Tests: Recent Labs  Lab 08/14/23 1610 08/15/23 0549 08/16/23 0623 08/17/23 0548 08/18/23 0535  AST 19  --   --  17  --   ALT 12  --   --  12  --   ALKPHOS 61  --   --  86  --   BILITOT 0.5  --   --  0.3  --   PROT 5.8*  --   --  5.1*  --   ALBUMIN  2.3* 1.9* 1.9* 1.8* 1.7*   Radiology Studies: DG Abd 1 View Result Date: 08/18/2023 CLINICAL DATA:  History of small bowel obstruction status post ileostomy with emesis EXAM: ABDOMEN - 1 VIEW COMPARISON:  CT abdomen and pelvis dated 08/12/2023 FINDINGS: The inferior rectum is not entirely included within the field of view. Gas-filled mildly dilated small bowel loops throughout the abdomen. No free air or pneumatosis. No abnormal radio-opaque calculi or mass effect. No acute or substantial osseous abnormality. The sacrum and coccyx are partially obscured by overlying bowel contents. Partially imaged thoracic aortic stent graft. Median sternotomy wires are nondisplaced. IMPRESSION: Gas-filled mildly dilated small bowel loops throughout the abdomen, which may represent ileus or persistent small bowel obstruction. Electronically Signed   By: Limin  Xu M.D.   On: 08/18/2023 12:32    Scheduled Meds:  Chlorhexidine  Gluconate Cloth  6 each Topical Daily   heparin  injection (subcutaneous)  5,000 Units Subcutaneous Q8H   insulin  aspart  0-15 Units Subcutaneous TID WC   metoprolol  tartrate  5 mg Intravenous Q4H   pantoprazole  (PROTONIX ) IV  40 mg Intravenous Q12H   sodium chloride  flush  10-40 mL Intracatheter Q12H   Continuous Infusions:  amiodarone  30 mg/hr (08/18/23 1249)   TPN ADULT (ION) 80 mL/hr at 08/18/23 0450   TPN ADULT (ION)      LOS: 11 days   Justina Oman M.D on 08/18/2023  [phone]   [facility] - Office  [phone]  If 7PM-7AM, please contact night-coverage [email or web address] 08/18/2023,  5:57 PM

## 2023-08-18 NOTE — Progress Notes (Signed)
 Patient ordered for NG tube. Went in room and explained to patient NG tube was needed. Patient refused NG tube multiple times. Explained to patient importance of needing NGT and patient still continued to refuse. Dr. Collene Dawson aware.

## 2023-08-18 NOTE — Progress Notes (Signed)
 Tele reviewed, afib 80s to 90s. She is on amio gtt at 30mg /hr, transition to oral when able regarding her SBO management. No additional cardiology recs at this time, we will follow telemetry tomorrow.    Letta Raw MD

## 2023-08-19 DIAGNOSIS — K56609 Unspecified intestinal obstruction, unspecified as to partial versus complete obstruction: Secondary | ICD-10-CM | POA: Diagnosis not present

## 2023-08-19 LAB — GLUCOSE, CAPILLARY
Glucose-Capillary: 167 mg/dL — ABNORMAL HIGH (ref 70–99)
Glucose-Capillary: 156 mg/dL — ABNORMAL HIGH (ref 70–99)
Glucose-Capillary: 150 mg/dL — ABNORMAL HIGH (ref 70–99)

## 2023-08-19 LAB — RENAL FUNCTION PANEL
Albumin: 1.7 g/dL — ABNORMAL LOW (ref 3.5–5.0)
Anion gap: 7 (ref 5–15)
BUN: 79 mg/dL — ABNORMAL HIGH (ref 8–23)
CO2: 22 mmol/L (ref 22–32)
Calcium: 8.1 mg/dL — ABNORMAL LOW (ref 8.9–10.3)
Chloride: 111 mmol/L (ref 98–111)
Creatinine, Ser: 2.28 mg/dL — ABNORMAL HIGH (ref 0.44–1.00)
GFR, Estimated: 24 mL/min — ABNORMAL LOW (ref >60)
Glucose, Bld: 171 mg/dL — ABNORMAL HIGH (ref 70–99)
Phosphorus: 3.5 mg/dL (ref 2.5–4.6)
Potassium: 4.7 mmol/L (ref 3.5–5.1)
Sodium: 140 mmol/L (ref 135–145)

## 2023-08-19 MED ORDER — CHLORHEXIDINE GLUCONATE CLOTH 2 % EX PADS
6.0000 | MEDICATED_PAD | Freq: Once | CUTANEOUS | Status: AC
  Administered 2023-08-20: 6 via TOPICAL

## 2023-08-19 MED ORDER — SODIUM CHLORIDE 0.9 % IV SOLN
2.0000 g | INTRAVENOUS | Status: AC
  Administered 2023-08-20: 2 g via INTRAVENOUS
  Filled 2023-08-19 (×2): qty 2

## 2023-08-19 MED ORDER — CHLORHEXIDINE GLUCONATE CLOTH 2 % EX PADS
6.0000 | MEDICATED_PAD | Freq: Once | CUTANEOUS | Status: AC
  Administered 2023-08-19: 6 via TOPICAL

## 2023-08-19 MED ORDER — MELATONIN 3 MG PO TABS
6.0000 mg | ORAL_TABLET | Freq: Once | ORAL | Status: DC
  Filled 2023-08-19 (×2): qty 2

## 2023-08-19 MED ORDER — TRAVASOL 10 % IV SOLN
INTRAVENOUS | Status: AC
Start: 2023-08-19 — End: 2023-08-20
  Filled 2023-08-19: qty 1152

## 2023-08-19 NOTE — Evaluation (Signed)
 Physical Therapy Evaluation Patient Details Name: Dorothy Harris MRN: [id number] DOB: Sep 08, 1960 Today's Date: 08/19/2023  History of Present Illness  Dorothy Harris is a 63 y.o. 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  Patient states that she has not been able to tolerate any oral intake, she complained of abdominal pain which was cramping in nature.  Colostomy has had only minimal output.  EMS was activated and arrival of EMS team, patient blood pressure was hypotensive with SBP in 70s, IV NS 250 mL bolus was given with SBP improving to 100's.  She was taken to the ED for further evaluation and management.   Clinical Impression  Patient demonstrates slow labored movement for sitting up at bedside with most difficulty scooting to EOB, once seated patient c/o severe dizziness, unable to attempt sit to stands and requested to go back to bed. Patient will benefit from continued skilled physical therapy in hospital and recommended venue below to increase strength, balance, endurance for safe ADLs and gait.          If plan is discharge home, recommend the following: A lot of help with bathing/dressing/bathroom;A lot of help with walking and/or transfers;Help with stairs or ramp for entrance;Assistance with cooking/housework   Can travel by private vehicle   No    Equipment Recommendations None recommended by PT  Recommendations for Other Services       Functional Status Assessment Patient has had a recent decline in their functional status and demonstrates the ability to make significant improvements in function in a reasonable and predictable amount of time.     Precautions / Restrictions Precautions Precautions: Fall Recall of Precautions/Restrictions:  Impaired Restrictions Weight Bearing Restrictions Per Provider Order: No      Mobility  Bed Mobility Overal bed mobility: Needs Assistance Bed Mobility: Supine to Sit     Supine to sit: Min assist     General bed mobility comments: increased time with labored movement    Transfers                        Ambulation/Gait                  Stairs            Wheelchair Mobility     Tilt Bed    Modified Rankin (Stroke Patients Only)       Balance Overall balance assessment: Needs assistance Sitting-balance support: Feet supported, No upper extremity supported Sitting balance-Leahy Scale: Fair Sitting balance - Comments: fair/good seated at EOB                                     Pertinent Vitals/Pain Pain Assessment Pain Assessment: No/denies pain    Home Living Family/patient expects to be discharged to:: Private residence Living Arrangements: Alone Available Help at Discharge: Family;Available 24 hours/day Type of Home: House Home Access: Stairs to enter Entrance Stairs-Rails: Left Entrance Stairs-Number of Steps: 1   Home Layout: One level Home Equipment: Agricultural consultant (2 wheels);Cane - quad;Shower seat      Prior Function Prior Level of Function : Independent/Modified Independent;Driving             Mobility  Comments: Community ambulation using Quad-cane, drives ADLs Comments: Independent     Extremity/Trunk Assessment   Upper Extremity Assessment Upper Extremity Assessment: Generalized weakness    Lower Extremity Assessment Lower Extremity Assessment: Generalized weakness    Cervical / Trunk Assessment Cervical / Trunk Assessment: Normal  Communication   Communication Communication: No apparent difficulties    Cognition Arousal: Alert Behavior During Therapy: WFL for tasks assessed/performed   PT - Cognitive impairments: No apparent impairments                          Following commands: Intact       Cueing Cueing Techniques: Verbal cues, Tactile cues     General Comments      Exercises     Assessment/Plan    PT Assessment Patient needs continued PT services  PT Problem List Decreased strength;Decreased activity tolerance;Decreased balance;Decreased mobility       PT Treatment Interventions DME instruction;Gait training;Stair training;Functional mobility training;Therapeutic activities;Therapeutic exercise;Balance training;Patient/family education    PT Goals (Current goals can be found in the Care Plan section)  Acute Rehab PT Goals Patient Stated Goal: Return home with family to assist PT Goal Formulation: With patient Time For Goal Achievement: 09/02/23 Potential to Achieve Goals: Good    Frequency Min 3X/week     Co-evaluation               AM-PAC PT "6 Clicks" Mobility  Outcome Measure Help needed turning from your back to your side while in a flat bed without using bedrails?: A Little Help needed moving from lying on your back to sitting on the side of a flat bed without using bedrails?: A Little Help needed moving to and from a bed to a chair (including a wheelchair)?: Total Help needed standing up from a chair using your arms (e.g., wheelchair or bedside chair)?: Total Help needed to walk in hospital room?: Total Help needed climbing 3-5 steps with a railing? : Total 6 Click Score: 10    End of Session   Activity Tolerance: Patient tolerated treatment well;Patient limited by fatigue;Other (comment) (Patient limited due to c/o dizziness) Patient left: in bed;with call bell/phone within reach Nurse Communication: Mobility status PT Visit Diagnosis: Unsteadiness on feet (R26.81);Other abnormalities of gait and mobility (R26.89);Muscle weakness (generalized) (M62.81)    Time: 6962-9528 PT Time Calculation (min) (ACUTE ONLY): 20 min   Charges:   PT Evaluation $PT Eval Moderate Complexity: 1 Mod PT  Treatments $Therapeutic Activity: 8-22 mins PT General Charges $$ ACUTE PT VISIT: 1 Visit         1:52 PM, 08/19/23 Walton Guppy, MPT Physical Therapist with [facility] [phone] office [phone] mobile phone

## 2023-08-19 NOTE — Progress Notes (Signed)
 Telemetry reviewed, remains in rate controlled afib. She remains npo, continue IV amio and transition to oral when able from GI standpoint. Not on anticoag with plans for possible surgery. From surgery notes plans for OR Thursday. We will continue to monitor telemetry and follow peripherally.    Armida Lander MD

## 2023-08-19 NOTE — Plan of Care (Signed)
  Problem: Acute Rehab PT Goals(only PT should resolve) Goal: Pt Will Go Supine/Side To Sit Outcome: Progressing Flowsheets (Taken 08/19/2023 1353) Pt will go Supine/Side to Sit:  with supervision  with contact guard assist Goal: Patient Will Transfer Sit To/From Stand Outcome: Progressing Flowsheets (Taken 08/19/2023 1353) Patient will transfer sit to/from stand:  with contact guard assist  with minimal assist Goal: Pt Will Transfer Bed To Chair/Chair To Bed Outcome: Progressing Flowsheets (Taken 08/19/2023 1353) Pt will Transfer Bed to Chair/Chair to Bed:  with contact guard assist  with min assist Goal: Pt Will Ambulate Outcome: Progressing Flowsheets (Taken 08/19/2023 1353) Pt will Ambulate:  25 feet  with contact guard assist  with minimal assist  with rolling walker   1:54 PM, 08/19/23 Dorothy Harris, MPT Physical Therapist with [facility] [phone] office [phone] mobile phone
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
## 2023-08-19 NOTE — Progress Notes (Signed)
 PHARMACY - TOTAL PARENTERAL NUTRITION CONSULT NOTE   Indication: Small bowel obstruction  Patient Measurements: Height: 5\' 7"  (170.2 cm) Weight: 99.2 kg (218 lb 11.1 oz) IBW/kg (Calculated) : 61.6 TPN AdjBW (KG): 69.4 Body mass index is 34.25 kg/m. Usual Weight:   Assessment:  Small bowel obstruction with ventral hernia and end ileostomy.  Glucose / Insulin : 164 - 184, 6 units insulin  given  Electrolytes: K 4.3 Corrected calcium  10 Renal: Alb 1.7, Scr 2.25 Hepatic: WNL  Central access: PICC TPN start date:  5/8  Nutritional Goals: Goal TPN rate is 80 mL/hr (provides 115 g of protein and 1920 kcals per day)  RD Assessment: Estimated Needs Total Energy Estimated Needs: 1900-2100 Total Protein Estimated Needs: 110-130 gm Total Fluid Estimated Needs: 1.9-2.1 L  Current Nutrition:  Off ensure while NPO for NG procedure. 100% nutrition from TPN  Plan:  Continue TPN at 80 mL/hr at 1800 Plan on continuing TPN until patient able to tolerate regular diet Electrolytes in TPN: Na 61mEq/L, K 52mEq/L, Ca 74mEq/L, Mg 108mEq/L, and Phos 5 mmol/L. Cl:Ac 1:1 Standard MVI and trace elements to TPN Continue Moderate q8h SSI and adjust as needed  Monitor TPN labs on Mon/Thurs  Ulyses Gandy, PharmD Clinical Pharmacist 08/19/2023 8:16 AM      08/19/2023 8:02 AM

## 2023-08-19 NOTE — TOC Progression Note (Signed)
 Transition of Care [facility]) - Progression Note    Patient Details  Name: Dorothy Harris MRN: [id number] Date of Birth: [birth year]  Transition of Care [facility] CM/SW Contact  [place]ville Lax, [place] Phone Number: 08/19/2023, 12:24 PM  Clinical Narrative:    CSW updated that PT is recommending SNF for pt at D/C once medically stable. CSW met with pt at bedside to review this, pt is agreeable to SNF referral being sent out and then for [facility] to review closer to pt being medically stable on what is needed. TOC to follow.   Expected Discharge Plan: Home/Self Care Barriers to Discharge: Continued Medical Work up  Expected Discharge Plan and Services In-house Referral: Clinical Social Work Discharge Planning Services: CM Consult   Living arrangements for the past 2 months: Single Family Home                                       Social Determinants of Health (SDOH) Interventions SDOH Screenings   Food Insecurity: Food Insecurity Present (08/07/2023)  Housing: High Risk (08/07/2023)  Transportation Needs: No Transportation Needs (08/07/2023)  Utilities: Not At Risk (08/07/2023)  Alcohol Screen: Low Risk  (05/07/2021)  Depression (PHQ2-9): Medium Risk (05/05/2023)  Financial Resource Strain: Low Risk  (05/07/2021)  Physical Activity: Inactive (05/07/2021)  Tobacco Use: High Risk (08/06/2023)    Readmission Risk Interventions    08/11/2023    8:27 AM 08/07/2023    7:29 AM 01/22/2022   12:57 PM  Readmission Risk Prevention Plan  Medication Screening  Complete   Transportation Screening Complete Complete Complete  PCP or Specialist Appt within 3-5 Days   Complete  HRI or Home Care Consult Complete  Complete  Social Work Consult for Recovery Care Planning/Counseling Complete  Complete  Palliative Care Screening Not Applicable  Not Applicable  Medication Review Oceanographer) Complete  Referral to Pharmacy

## 2023-08-19 NOTE — [place] FL2 (Signed)
 [place]  MEDICAID FL2 LEVEL OF CARE FORM     IDENTIFICATION  Patient Name: Dorothy Harris Birthdate: [birth year] Sex: female Admission Date (Current Location): 08/06/2023  [facility] and [place] Number:  [employer] and Address:  [facility],  [address] [name] [address]      Provider Number: [id number]  Attending Physician Name and Address:  Cornelius Dill, DO  Relative Name and Phone Number:       Current Level of Care: Hospital Recommended Level of Care: Skilled Nursing Facility Prior Approval Number:    Date Approved/Denied:   PASRR Number:    Discharge Plan: SNF    Current Diagnoses: Patient Active Problem List   Diagnosis Date Noted   Atrial fibrillation with rapid ventricular response (HCC) 08/17/2023   Small bowel obstruction (HCC) 08/07/2023   Abdominal pain 08/07/2023   Nausea & vomiting 08/07/2023   Dehydration 08/07/2023   Elevated troponin 08/07/2023   Obesity, Class I, BMI 30-34.9 08/07/2023   (HFpEF) heart failure with preserved ejection fraction (HCC) 08/07/2023   Ischemic colitis (HCC) 08/07/2023   Ileostomy in place (HCC) 08/07/2023   Parastomal hernia without obstruction or gangrene 08/07/2023   Incisional hernia with obstruction but no gangrene 08/07/2023   Dyspnea on exertion 11/19/2022   Vitamin D  deficiency 05/29/2022   Abdominal aortic aneurysm (AAA) (HCC) 05/08/2022   Iron deficiency 05/08/2022   Acute lumbar back pain 05/08/2022   Anemia of chronic renal failure 03/20/2022   Hospital discharge follow-up 02/20/2022   Anemia 01/29/2022   History of pneumonia 01/29/2022   Hyperglycemia 01/29/2022   Impaired mobility and ADLs 01/29/2022   Incomplete paraplegia (HCC) 01/29/2022   Wound of left groin 01/29/2022   Stage 4 chronic kidney disease (HCC)    Leukocytosis    Leg weakness, bilateral 01/17/2022   Thoracic aortic aneurysm (HCC) 01/15/2022   Annual physical exam 11/05/2021   Depression, major, single  episode, complete remission (HCC) 06/21/2021   S/P exploratory laparotomy 06/21/2021   Mass of right breast 06/21/2021   Disability examination 06/21/2021   Pressure injury of skin 06/02/2021   Hyperkalemia 06/01/2021   Acute hyperkalemia 06/01/2021   PAF (paroxysmal atrial fibrillation) (HCC) 05/07/2021   Essential hypertension 05/07/2021   Pure hypercholesterolemia 05/07/2021   Debility 04/15/2021   Malnutrition of moderate degree 04/03/2021   Encounter for postanesthesia care 04/02/2021   Peritonitis (HCC) 04/02/2021   Endotracheally intubated 04/02/2021   Acute kidney injury superimposed on stage 4 chronic kidney disease (HCC) 04/02/2021   S/P ascending aortic replacement 03/25/2021   Encounter for weaning from ventilator [facility])    Status post surgery 03/24/2021   Tobacco abuse 07/30/2017   History of drug use 07/30/2017   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/2017   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   Chest pain 05/31/2017   Coronary artery disease due to lipid rich plaque    Acute on chronic diastolic CHF (congestive heart failure) (HCC)    Acute coronary syndrome (HCC) 12/23/2015   NSTEMI (non-ST elevated myocardial infarction) (HCC) 12/22/2015    Orientation RESPIRATION BLADDER Height & Weight     Self, Time, Situation, Place  Normal Incontinent Weight: 218 lb 11.1 oz (99.2 kg) Height:  5\' 7"  (170.2 cm)  BEHAVIORAL SYMPTOMS/MOOD NEUROLOGICAL BOWEL NUTRITION STATUS      Continent Diet (See D/C summary)  AMBULATORY STATUS COMMUNICATION OF NEEDS Skin   Extensive Assist Verbally Normal  Personal Care Assistance Level of Assistance  Bathing, Feeding, Dressing Bathing Assistance: Limited assistance Feeding assistance: Independent Dressing Assistance: Limited assistance     Functional Limitations Info  Sight, Speech, Hearing Sight Info: Adequate Hearing Info: Adequate Speech Info: Adequate    SPECIAL CARE  FACTORS FREQUENCY  PT (By licensed PT), OT (By licensed OT)     PT Frequency: 5 times weekly OT Frequency: 5 times weekly            Contractures Contractures Info: Not present    Additional Factors Info  Code Status, Allergies Code Status Info: FULL Allergies Info: Chlorthalidone            Current Medications (08/19/2023):  This is the current hospital active medication list Current Facility-Administered Medications  Medication Dose Route Frequency Provider Last Rate Last Admin   acetaminophen  (TYLENOL ) tablet 650 mg  650 mg Oral Q6H PRN Justina Oman, MD       Or   acetaminophen  (TYLENOL ) suppository 650 mg  650 mg Rectal Q6H PRN Justina Oman, MD       amiodarone  (NEXTERONE  PREMIX) 360-4.14 MG/200ML-% (1.8 mg/mL) IV infusion  30 mg/hr Intravenous Continuous Justina Oman, MD 16.67 mL/hr at 08/19/23 0619 30 mg/hr at 08/19/23 0619   [START ON 08/20/2023] cefoTEtan (CEFOTAN) 2 g in sodium chloride  0.9 % 100 mL IVPB  2 g Intravenous On Call to OR Awilda Bogus, MD       Chlorhexidine  Gluconate Cloth 2 % PADS 6 each  6 each Topical Daily Justina Oman, MD   6 each at 08/19/23 0846   Chlorhexidine  Gluconate Cloth 2 % PADS 6 each  6 each Topical Once Awilda Bogus, MD       And   Chlorhexidine  Gluconate Cloth 2 % PADS 6 each  6 each Topical Once Awilda Bogus, MD       heparin  injection 5,000 Units  5,000 Units Subcutaneous Q8H Justina Oman, MD   5,000 Units at 08/19/23 0532   hydrALAZINE  (APRESOLINE ) injection 10 mg  10 mg Intravenous Q6H PRN Justina Oman, MD       HYDROmorphone  (DILAUDID ) injection 1 mg  1 mg Intravenous Q4H PRN Justina Oman, MD   1 mg at 08/19/23 1050   insulin  aspart (novoLOG ) injection 0-15 Units  0-15 Units Subcutaneous TID WC Justina Oman, MD   3 Units at 08/19/23 0841   metoprolol  tartrate (LOPRESSOR ) injection 5 mg  5 mg Intravenous Q4H Justina Oman, MD   5 mg at 08/19/23 9147   pantoprazole  (PROTONIX ) injection 40 mg  40 mg  Intravenous Q12H Justina Oman, MD   40 mg at 08/19/23 0842   phenol (CHLORASEPTIC) mouth spray 1 spray  1 spray Mouth/Throat PRN Justina Oman, MD   1 spray at 08/14/23 2140   prochlorperazine  (COMPAZINE ) injection 10 mg  10 mg Intravenous Q6H PRN Justina Oman, MD   10 mg at 08/19/23 0843   sodium chloride  flush (NS) 0.9 % injection 10-40 mL  10-40 mL Intracatheter Q12H Justina Oman, MD   10 mL at 08/19/23 0846   sodium chloride  flush (NS) 0.9 % injection 10-40 mL  10-40 mL Intracatheter PRN Justina Oman, MD       TPN ADULT (ION)   Intravenous Continuous TPN Olin Bertin, RPH 80 mL/hr at 08/19/23 8295 Infusion Verify at 08/19/23 6213   TPN ADULT (ION)   Intravenous Continuous TPN Madueme, Elvira C, RPH         Discharge Medications: Please see discharge summary  for a list of discharge medications.  Relevant Imaging Results:  Relevant Lab Results:   Additional Information SSN: [id number] [place]

## 2023-08-19 NOTE — Progress Notes (Signed)
 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  Patient currently noted to have high-grade SBO with plans for operative repair scheduled 5/15.  AKI has improved and she remains on amiodarone  infusion for atrial fibrillation with RVR.  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   Atrial fibrillation with rapid ventricular response (HCC)  Assessment and Plan:  1)High-Grade SBO associated with ventral hernia and end ileostomy with parastomal hernia (H/o  Ischemic Colitis s/p hemi-colectomy with Ileostomy in 03/2021):  Presented with high-grade SBO in the setting of previous abdominal surgeries with ileostomy in place. - General Surgery consult from Dr. Collene Harris appreciated --- the hernia appears to be reducible per general surgeon - Patient experiencing nausea/vomiting, KUB demonstrating ongoing SBO. - Continue supportive care, electrolyte repletion and as needed analgesics/antiemetics. -Per general surgery team there is a possibility of surgical intervention in the next 24 to 48 hours. -TPN has been  started; pharmacy to assist with rate and levels. -continue to follow electrolytes. -patient remains high risk for any surgical intervention. - Continue current conservative management and plans for operative repair 5/15.   2) AKI----acute kidney injury on CKD stage IV -due to dehydration/volume depletion in a patient with SBO and n.p.o. status with high output from NG tube -creatinine on admission= 4.59,  baseline creatinine = 2.0 to 2.2  ,  -creatinine peaked at 8.07 (on 08/09/23) - Continues to remain stable - Nephrology consultation appreciated - No metabolic acidosis and no hyperkalemia at this time, elevated anion gap noted - patient is volume depleted, -continue to maintain adequate hydration, follow nephrology rec's -Renally adjust medications, avoid nephrotoxic agents / hypotension     3) atypical chest pains with  elevated Troponin-resolved -EKG nonacute suspect demand ischemia due to AKI and  bowel obstruction.  Troponin 214 >>206-- echo requested - Lower extremity Dopplers and VQ scan negative for DVT and PE respectively 08/11/23 - Patient remains at high risk for any surgical intervention. - Continue rate control with the use of IV amiodarone  - No ACS concerns per cardiology recommendations; 2D echo reassuring and demonstrating preserved ejection fraction. -continue holding B-blocker at the moment, due to soft BP.   4)Essential hypertension: BP on the soft sides and will require pressors.  - c/n to Hold PTA amlodipine  and oral hydralazine   - Continue to follow VS   5)Paroxysmal A-fib: -PTA amiodarone  and Coreg  on hold due to n.p.o. status -Patient was not on anticoagulation PTA -follow echo results - Continue IV amiodarone  drip; intermittent boluses will be provided to assist with rate control.Dorothy Harris   6)HFpEF w/ tte 8/24-EF 65 to 70%,  G1DD: Currently volume depleted needing IV fluids due to SBO.   - Be judicious with IV fluids - follow Echo results -continue to follow  I's and O's   7)HLD - Continue to hold PTA atorvastatin  while n.p.o.   8)Hypokalemia -Due to GI losses - Continue to follow trend and replete as needed. -Magnesium  WNL - Keep potassium around 4 especially in view of arrhythmias. -plan is for Mg around 2.   9)Hypernatremia--- due to dehydration,  - Creatinine is down to 144 from 147 continue fluids as ordered   10)acute urinary retention -in and out cath X 3 without success of voiding spontaneously; foley catheter placed and good urine output appreciated. - Creatinine has further improved and back to her baseline at the moment. - No suprapubic tenderness reported.   11)leukocytosis and abd pain - Continue to hold antibiotics at the moment - CT chest no demonstrating perforation or abscesses; images with ongoing consistent SBO pattern. - Continue to follow WBCs trend intermittently; patient is afebrile.    DVT prophylaxis: Heparin  Code Status: Full Family Communication:  Disposition Plan:  Status is: Inpatient Remains inpatient appropriate because: Need for IV medications and fluids   Nutritional Assessment:  The patient's BMI is: Body mass index is 34.25 kg/m.Dorothy Harris  Seen by dietician.  I agree with the assessment and plan as outlined below:  Nutrition Status: Nutrition Problem: Inadequate oral intake Etiology: altered GI function Signs/Symptoms: NPO status Interventions: TPN  Consultants:  General surgery Cardiology  Procedures:  None  Antimicrobials:  None   Subjective: Patient seen and evaluated today with no new acute complaints or concerns. No acute concerns or events noted overnight.  She denies any nausea or vomiting and also denies any flatus or bowel movements.  Continues to refuse NG tube.  Objective: Vitals:   08/19/23 0500 08/19/23 0534 08/19/23 0600 08/19/23 0700  BP: (!) 140/65  129/66 120/61  Pulse: (!) 125  (!) 125 97  Resp: 14  17 13   Temp:  98.4 F (36.9 C)    TempSrc:  Oral    SpO2:  96%  97% 96%  Weight: 99.2 kg     Height:        Intake/Output Summary (Last 24 hours) at 08/19/2023 1237 Last data filed at 08/19/2023 1610 Gross per 24 hour  Intake 1257.09 ml  Output 1450 ml  Net -192.91 ml   Filed Weights   08/17/23 0500 08/18/23 0500 08/19/23 0500  Weight: 100.8 kg 99.9 kg 99.2 kg    Examination:  General exam: Appears calm and comfortable  Respiratory system: Clear to auscultation. Respiratory effort normal. Cardiovascular system: S1 & S2 heard, irregular and tachycardic. Gastrointestinal system: Abdomen is mildly tense and distended Central nervous system: Alert and awake Extremities: No edema Skin: No significant lesions noted Psychiatry: Flat affect.    Data Reviewed: I have personally reviewed following labs and imaging studies  CBC: No results for input(s): "WBC", "NEUTROABS", "HGB", "HCT", "MCV", "PLT" in the last 168 hours. Basic Metabolic Panel: Recent Labs  Lab 08/14/23 0637 08/15/23 0549 08/16/23 0623 08/17/23 0548 08/18/23 0535 08/19/23 0411  NA 141 142 142 140 141 140  K 3.5 4.0 4.0 4.1 4.3 4.7  CL 100 105 107 106 108 111  CO2 30 27 25 23 25 22   GLUCOSE 166* 188* 234* 214* 174* 171*  BUN 57* 58* 68* 71* 77* 79*  CREATININE 2.27* 2.06* 2.13* 2.09* 2.25* 2.28*  CALCIUM  8.4* 8.0* 8.0* 8.0* 8.2* 8.1*  MG 1.9 1.8  --  1.9  --   --   PHOS 3.2 2.6 3.2 4.2 3.8 3.5   GFR: Estimated Creatinine Clearance: 30.9 mL/min (A) (by C-G formula based on SCr of 2.28 mg/dL (H)). Liver Function Tests: Recent Labs  Lab 08/14/23 4098 08/15/23 0549 08/16/23 0623 08/17/23 0548 08/18/23 0535 08/19/23 0411  AST 19  --   --  17  --   --   ALT 12  --   --  12  --   --   ALKPHOS 61  --   --  86  --   --   BILITOT 0.5  --   --  0.3  --   --   PROT 5.8*  --   --  5.1*  --   --   ALBUMIN  2.3* 1.9* 1.9* 1.8* 1.7* 1.7*   No results for input(s): "LIPASE", "AMYLASE" in the last 168 hours. No results for input(s): "AMMONIA" in the last 168  hours. Coagulation Profile: No results for input(s): "INR", "PROTIME" in the last 168 hours. Cardiac Enzymes: No results for input(s): "CKTOTAL", "CKMB", "CKMBINDEX", "TROPONINI" in the last 168 hours. BNP (last 3 results) No results for input(s): "PROBNP" in the last 8760 hours. HbA1C: No results for input(s): "HGBA1C" in the last 72 hours. CBG: Recent Labs  Lab 08/18/23 0741 08/18/23 1133 08/18/23 1634 08/19/23 0741 08/19/23 1154  GLUCAP 187* 184* 164* 167* 156*   Lipid Profile: Recent Labs    08/17/23 0548  TRIG 218*   Thyroid Function Tests: No results for input(s): "TSH", "T4TOTAL", "FREET4", "T3FREE", "THYROIDAB" in the last 72 hours. Anemia Panel: No results for input(s): "VITAMINB12", "FOLATE", "FERRITIN", "TIBC", "IRON", "RETICCTPCT" in the last 72 hours. Sepsis Labs: No results for input(s): "PROCALCITON", "LATICACIDVEN" in the last 168 hours.  Recent Results (from the past 240 hours)  MRSA Next Gen by PCR, Nasal     Status: None   Collection Time: 08/12/23  8:53 AM   Specimen: Nasal Mucosa; Nasal Swab  Result Value Ref Range Status   MRSA by PCR Next Gen NOT DETECTED NOT DETECTED Final    Comment: (NOTE) The GeneXpert MRSA Assay (FDA approved for NASAL specimens only), is one component of a comprehensive MRSA colonization surveillance program. It is not intended to diagnose MRSA infection nor to guide or monitor treatment for MRSA infections. Test performance is not FDA approved in patients less than 28 years old. Performed at [facility], [address] [place], [place] [address]          Radiology Studies: DG Abd 1 View Result Date: 08/18/2023 CLINICAL DATA:  History of small bowel obstruction status post ileostomy with emesis EXAM: ABDOMEN - 1 VIEW COMPARISON:  CT abdomen and pelvis dated 08/12/2023 FINDINGS: The inferior rectum is not entirely included within the field of view. Gas-filled mildly dilated small bowel loops throughout the abdomen. No  free air or pneumatosis. No abnormal radio-opaque calculi or mass effect. No acute or substantial osseous abnormality. The sacrum and coccyx are partially obscured by overlying bowel contents. Partially imaged thoracic aortic stent graft. Median sternotomy wires are nondisplaced. IMPRESSION: Gas-filled mildly dilated small bowel loops throughout the abdomen, which may represent ileus or persistent small bowel obstruction. Electronically Signed   By: Limin  Xu M.D.   On: 08/18/2023 12:32        Scheduled Meds:  Chlorhexidine  Gluconate Cloth  6 each Topical Daily   Chlorhexidine  Gluconate Cloth  6 each Topical Once   And   Chlorhexidine  Gluconate Cloth  6 each Topical Once   heparin  injection (subcutaneous)  5,000 Units Subcutaneous Q8H   insulin  aspart  0-15 Units Subcutaneous TID WC   metoprolol  tartrate  5 mg Intravenous Q4H   pantoprazole  (PROTONIX ) IV  40 mg Intravenous Q12H   sodium chloride  flush  10-40 mL Intracatheter Q12H   Continuous Infusions:  amiodarone  30 mg/hr (08/19/23 0619)   [START ON 08/20/2023] cefoTEtan (CEFOTAN) IV     TPN ADULT (ION) 80 mL/hr at 08/19/23 0619   TPN ADULT (ION)       LOS: 12 days    Time spent: 55 minutes    Adelyna Brockman Loran Rock, DO [facility]  If 7PM-7AM, please contact night-coverage [email or web address] 08/19/2023, 12:37 PM

## 2023-08-20 ENCOUNTER — Encounter (HOSPITAL_COMMUNITY): Payer: Self-pay | Admitting: Internal Medicine

## 2023-08-20 ENCOUNTER — Encounter (HOSPITAL_COMMUNITY)
Admission: EM | Disposition: A | Discharge status: Skilled Nursing Facility | Payer: Self-pay | Source: Home / Self Care | Attending: Internal Medicine

## 2023-08-20 ENCOUNTER — Other Ambulatory Visit: Payer: Self-pay

## 2023-08-20 ENCOUNTER — Inpatient Hospital Stay (HOSPITAL_COMMUNITY)

## 2023-08-20 ENCOUNTER — Inpatient Hospital Stay (HOSPITAL_COMMUNITY): Admitting: Anesthesiology

## 2023-08-20 DIAGNOSIS — F17210 Nicotine dependence, cigarettes, uncomplicated: Secondary | ICD-10-CM

## 2023-08-20 DIAGNOSIS — K433 Parastomal hernia with obstruction, without gangrene: Secondary | ICD-10-CM

## 2023-08-20 DIAGNOSIS — K565 Intestinal adhesions [bands], unspecified as to partial versus complete obstruction: Secondary | ICD-10-CM

## 2023-08-20 DIAGNOSIS — K56609 Unspecified intestinal obstruction, unspecified as to partial versus complete obstruction: Secondary | ICD-10-CM | POA: Diagnosis not present

## 2023-08-20 DIAGNOSIS — I2510 Atherosclerotic heart disease of native coronary artery without angina pectoris: Secondary | ICD-10-CM | POA: Diagnosis not present

## 2023-08-20 DIAGNOSIS — K651 Peritoneal abscess: Secondary | ICD-10-CM | POA: Diagnosis present

## 2023-08-20 DIAGNOSIS — K631 Perforation of intestine (nontraumatic): Secondary | ICD-10-CM | POA: Diagnosis present

## 2023-08-20 HISTORY — PX: LAPAROTOMY: SHX154

## 2023-08-20 HISTORY — PX: ILEOSTOMY CLOSURE: SHX1784

## 2023-08-20 HISTORY — PX: LYSIS OF ADHESION: SHX5961

## 2023-08-20 LAB — RENAL FUNCTION PANEL
Albumin: 1.9 g/dL — ABNORMAL LOW (ref 3.5–5.0)
Anion gap: 10 (ref 5–15)
BUN: 77 mg/dL — ABNORMAL HIGH (ref 8–23)
CO2: 20 mmol/L — ABNORMAL LOW (ref 22–32)
Calcium: 8.5 mg/dL — ABNORMAL LOW (ref 8.9–10.3)
Chloride: 112 mmol/L — ABNORMAL HIGH (ref 98–111)
Creatinine, Ser: 2.23 mg/dL — ABNORMAL HIGH (ref 0.44–1.00)
GFR, Estimated: 24 mL/min — ABNORMAL LOW (ref >60)
Glucose, Bld: 156 mg/dL — ABNORMAL HIGH (ref 70–99)
Phosphorus: 3.3 mg/dL (ref 2.5–4.6)
Potassium: 4.8 mmol/L (ref 3.5–5.1)
Sodium: 142 mmol/L (ref 135–145)

## 2023-08-20 LAB — CBC WITH DIFFERENTIAL/PLATELET
Abs Immature Granulocytes: 0.11 10*3/uL — ABNORMAL HIGH (ref 0.00–0.07)
Basophils Absolute: 0 10*3/uL (ref 0.0–0.1)
Basophils Relative: 0 %
Eosinophils Absolute: 0.1 10*3/uL (ref 0.0–0.5)
Eosinophils Relative: 1 %
HCT: 27.8 % — ABNORMAL LOW (ref 36.0–46.0)
Hemoglobin: 8.7 g/dL — ABNORMAL LOW (ref 12.0–15.0)
Immature Granulocytes: 1 %
Lymphocytes Relative: 11 %
Lymphs Abs: 1.1 10*3/uL (ref 0.7–4.0)
MCH: 31.8 pg (ref 26.0–34.0)
MCHC: 31.3 g/dL (ref 30.0–36.0)
MCV: 101.5 fL — ABNORMAL HIGH (ref 80.0–100.0)
Monocytes Absolute: 1.1 10*3/uL — ABNORMAL HIGH (ref 0.1–1.0)
Monocytes Relative: 10 %
Neutro Abs: 8.4 10*3/uL — ABNORMAL HIGH (ref 1.7–7.7)
Neutrophils Relative %: 77 %
Platelets: 300 10*3/uL (ref 150–400)
RBC: 2.74 MIL/uL — ABNORMAL LOW (ref 3.87–5.11)
RDW: 16.7 % — ABNORMAL HIGH (ref 11.5–15.5)
WBC: 10.8 10*3/uL — ABNORMAL HIGH (ref 4.0–10.5)
nRBC: 0 % (ref 0.0–0.2)

## 2023-08-20 LAB — GLUCOSE, CAPILLARY
Glucose-Capillary: 154 mg/dL — ABNORMAL HIGH (ref 70–99)
Glucose-Capillary: 156 mg/dL — ABNORMAL HIGH (ref 70–99)
Glucose-Capillary: 244 mg/dL — ABNORMAL HIGH (ref 70–99)
Glucose-Capillary: 247 mg/dL — ABNORMAL HIGH (ref 70–99)

## 2023-08-20 LAB — COMPREHENSIVE METABOLIC PANEL WITH GFR
ALT: 68 U/L — ABNORMAL HIGH (ref 0–44)
AST: 41 U/L (ref 15–41)
Albumin: 1.9 g/dL — ABNORMAL LOW (ref 3.5–5.0)
Alkaline Phosphatase: 160 U/L — ABNORMAL HIGH (ref 38–126)
Anion gap: 10 (ref 5–15)
BUN: 77 mg/dL — ABNORMAL HIGH (ref 8–23)
CO2: 20 mmol/L — ABNORMAL LOW (ref 22–32)
Calcium: 8.5 mg/dL — ABNORMAL LOW (ref 8.9–10.3)
Chloride: 112 mmol/L — ABNORMAL HIGH (ref 98–111)
Creatinine, Ser: 2.14 mg/dL — ABNORMAL HIGH (ref 0.44–1.00)
GFR, Estimated: 26 mL/min — ABNORMAL LOW (ref >60)
Glucose, Bld: 154 mg/dL — ABNORMAL HIGH (ref 70–99)
Potassium: 4.8 mmol/L (ref 3.5–5.1)
Sodium: 142 mmol/L (ref 135–145)
Total Bilirubin: 0.4 mg/dL (ref 0.0–1.2)
Total Protein: 5.4 g/dL — ABNORMAL LOW (ref 6.5–8.1)

## 2023-08-20 LAB — PHOSPHORUS: Phosphorus: 3.3 mg/dL (ref 2.5–4.6)

## 2023-08-20 LAB — MAGNESIUM: Magnesium: 2.3 mg/dL (ref 1.7–2.4)

## 2023-08-20 SURGERY — LAPAROTOMY, EXPLORATORY
Anesthesia: General | Site: Abdomen

## 2023-08-20 MED ORDER — FENTANYL CITRATE (PF) 100 MCG/2ML IJ SOLN
INTRAMUSCULAR | Status: DC | PRN
  Administered 2023-08-20 (×2): 25 ug via INTRAVENOUS
  Administered 2023-08-20: 50 ug via INTRAVENOUS
  Administered 2023-08-20: 25 ug via INTRAVENOUS
  Administered 2023-08-20: 50 ug via INTRAVENOUS
  Administered 2023-08-20: 25 ug via INTRAVENOUS

## 2023-08-20 MED ORDER — FENTANYL CITRATE (PF) 100 MCG/2ML IJ SOLN
INTRAMUSCULAR | Status: AC
  Filled 2023-08-20: qty 2

## 2023-08-20 MED ORDER — ONDANSETRON HCL 4 MG/2ML IJ SOLN
INTRAMUSCULAR | Status: AC
  Filled 2023-08-20: qty 2

## 2023-08-20 MED ORDER — PROPOFOL 10 MG/ML IV BOLUS
INTRAVENOUS | Status: DC | PRN
  Administered 2023-08-20 (×2): 20 mg via INTRAVENOUS
  Administered 2023-08-20: 30 mg via INTRAVENOUS
  Administered 2023-08-20 (×3): 20 mg via INTRAVENOUS
  Administered 2023-08-20: 100 mg via INTRAVENOUS
  Administered 2023-08-20: 30 mg via INTRAVENOUS
  Administered 2023-08-20 (×2): 20 mg via INTRAVENOUS

## 2023-08-20 MED ORDER — PHENYLEPHRINE 80 MCG/ML (10ML) SYRINGE FOR IV PUSH (FOR BLOOD PRESSURE SUPPORT)
PREFILLED_SYRINGE | INTRAVENOUS | Status: DC | PRN
  Administered 2023-08-20: 160 ug via INTRAVENOUS
  Administered 2023-08-20: 80 ug via INTRAVENOUS
  Administered 2023-08-20 (×2): 160 ug via INTRAVENOUS
  Administered 2023-08-20 (×2): 80 ug via INTRAVENOUS

## 2023-08-20 MED ORDER — LACTATED RINGERS IV SOLN: INTRAVENOUS | Status: DC

## 2023-08-20 MED ORDER — HYDROMORPHONE HCL 1 MG/ML IJ SOLN
0.5000 mg | INTRAMUSCULAR | Status: DC | PRN
  Administered 2023-08-20 – 2023-08-21 (×9): 1 mg via INTRAVENOUS
  Filled 2023-08-20 (×9): qty 1

## 2023-08-20 MED ORDER — FENTANYL CITRATE PF 50 MCG/ML IJ SOSY
25.0000 ug | PREFILLED_SYRINGE | INTRAMUSCULAR | Status: DC | PRN
  Administered 2023-08-20 (×3): 50 ug via INTRAVENOUS
  Filled 2023-08-20 (×2): qty 1

## 2023-08-20 MED ORDER — ONDANSETRON HCL 4 MG/2ML IJ SOLN: 4.0000 mg | Freq: Once | INTRAMUSCULAR | Status: DC | PRN

## 2023-08-20 MED ORDER — SODIUM CHLORIDE 0.9 % IR SOLN
Status: DC | PRN
  Administered 2023-08-20 (×3): 1000 mL

## 2023-08-20 MED ORDER — LIDOCAINE 2% (20 MG/ML) 5 ML SYRINGE
INTRAMUSCULAR | Status: DC | PRN
  Administered 2023-08-20: 100 mg via INTRAVENOUS

## 2023-08-20 MED ORDER — MIDAZOLAM HCL 2 MG/2ML IJ SOLN
INTRAMUSCULAR | Status: AC
  Filled 2023-08-20: qty 2

## 2023-08-20 MED ORDER — FENTANYL CITRATE PF 50 MCG/ML IJ SOSY
PREFILLED_SYRINGE | INTRAMUSCULAR | Status: AC
  Filled 2023-08-20: qty 1

## 2023-08-20 MED ORDER — ALBUMIN HUMAN 5 % IV SOLN: INTRAVENOUS | Status: DC | PRN

## 2023-08-20 MED ORDER — SODIUM CHLORIDE (PF) 0.9 % IJ SOLN
INTRAMUSCULAR | Status: AC
  Filled 2023-08-20: qty 10

## 2023-08-20 MED ORDER — PROPOFOL 10 MG/ML IV BOLUS
INTRAVENOUS | Status: AC
  Filled 2023-08-20: qty 20

## 2023-08-20 MED ORDER — ORAL CARE MOUTH RINSE: 15.0000 mL | Freq: Once | OROMUCOSAL | Status: AC

## 2023-08-20 MED ORDER — DEXAMETHASONE SODIUM PHOSPHATE 10 MG/ML IJ SOLN
INTRAMUSCULAR | Status: AC
  Filled 2023-08-20: qty 1

## 2023-08-20 MED ORDER — LIDOCAINE 2% (20 MG/ML) 5 ML SYRINGE
INTRAMUSCULAR | Status: AC
  Filled 2023-08-20: qty 5

## 2023-08-20 MED ORDER — PHENYLEPHRINE HCL-NACL 20-0.9 MG/250ML-% IV SOLN
INTRAVENOUS | Status: AC
  Filled 2023-08-20: qty 250

## 2023-08-20 MED ORDER — DEXAMETHASONE SODIUM PHOSPHATE 10 MG/ML IJ SOLN
INTRAMUSCULAR | Status: DC | PRN
  Administered 2023-08-20: 10 mg via INTRAVENOUS

## 2023-08-20 MED ORDER — BUPIVACAINE HCL (PF) 0.5 % IJ SOLN
INTRAMUSCULAR | Status: AC
Start: 2023-08-20 — End: ?
  Filled 2023-08-20: qty 30

## 2023-08-20 MED ORDER — TRAVASOL 10 % IV SOLN
INTRAVENOUS | Status: AC
  Filled 2023-08-20: qty 1152

## 2023-08-20 MED ORDER — MIDAZOLAM HCL 2 MG/2ML IJ SOLN
INTRAMUSCULAR | Status: DC | PRN
  Administered 2023-08-20: 2 mg via INTRAVENOUS

## 2023-08-20 MED ORDER — ROCURONIUM BROMIDE 10 MG/ML (PF) SYRINGE
PREFILLED_SYRINGE | INTRAVENOUS | Status: DC | PRN
  Administered 2023-08-20: 50 mg via INTRAVENOUS
  Administered 2023-08-20 (×2): 20 mg via INTRAVENOUS

## 2023-08-20 MED ORDER — PIPERACILLIN-TAZOBACTAM 3.375 G IVPB
3.3750 g | Freq: Three times a day (TID) | INTRAVENOUS | Status: AC
  Administered 2023-08-20 – 2023-08-25 (×15): 3.375 g via INTRAVENOUS
  Filled 2023-08-20 (×15): qty 50

## 2023-08-20 MED ORDER — ALBUMIN HUMAN 5 % IV SOLN
INTRAVENOUS | Status: AC
  Filled 2023-08-20: qty 250

## 2023-08-20 MED ORDER — ROCURONIUM BROMIDE 10 MG/ML (PF) SYRINGE: PREFILLED_SYRINGE | INTRAVENOUS | Status: DC | PRN

## 2023-08-20 MED ORDER — OXYCODONE HCL 5 MG/5ML PO SOLN: 5.0000 mg | Freq: Once | ORAL | Status: DC | PRN

## 2023-08-20 MED ORDER — ROCURONIUM BROMIDE 10 MG/ML (PF) SYRINGE
PREFILLED_SYRINGE | INTRAVENOUS | Status: AC
  Filled 2023-08-20: qty 10

## 2023-08-20 MED ORDER — OXYCODONE HCL 5 MG PO TABS: 5.0000 mg | ORAL_TABLET | Freq: Once | ORAL | Status: DC | PRN

## 2023-08-20 MED ORDER — PHENYLEPHRINE HCL-NACL 20-0.9 MG/250ML-% IV SOLN
INTRAVENOUS | Status: DC | PRN
  Administered 2023-08-20: 90 ug/min via INTRAVENOUS
  Administered 2023-08-20: 30 ug/min via INTRAVENOUS

## 2023-08-20 MED ORDER — SUGAMMADEX SODIUM 200 MG/2ML IV SOLN
INTRAVENOUS | Status: DC | PRN
  Administered 2023-08-20 (×2): 100 mg via INTRAVENOUS

## 2023-08-20 MED ORDER — SUCCINYLCHOLINE CHLORIDE 200 MG/10ML IV SOSY
PREFILLED_SYRINGE | INTRAVENOUS | Status: DC | PRN
  Administered 2023-08-20: 100 mg via INTRAVENOUS

## 2023-08-20 MED ORDER — ONDANSETRON HCL 4 MG/2ML IJ SOLN
INTRAMUSCULAR | Status: DC | PRN
Start: 2023-08-20 — End: 2023-08-20
  Administered 2023-08-20: 4 mg via INTRAVENOUS

## 2023-08-20 MED ORDER — BUPIVACAINE HCL (PF) 0.5 % IJ SOLN
INTRAMUSCULAR | Status: DC | PRN
  Administered 2023-08-20: 30 mL

## 2023-08-20 MED ORDER — CHLORHEXIDINE GLUCONATE 0.12 % MT SOLN: 15.0000 mL | Freq: Once | OROMUCOSAL | Status: AC

## 2023-08-20 MED ORDER — CHLORHEXIDINE GLUCONATE 0.12 % MT SOLN
OROMUCOSAL | Status: AC
  Administered 2023-08-20: 15 mL via OROMUCOSAL
  Filled 2023-08-20: qty 15

## 2023-08-20 SURGICAL SUPPLY — 44 items
BARRIER SKIN 2 3/4 (OSTOMY) ×2 IMPLANT
BARRIER SKIN OD2.25 2 3/4 FLNG (OSTOMY) IMPLANT
BENZOIN TINCTURE PRP APPL 2/3 (GAUZE/BANDAGES/DRESSINGS) IMPLANT
CHLORAPREP W/TINT 26 (MISCELLANEOUS) ×2 IMPLANT
CLAMP POUCH DRAINAGE QUIET (OSTOMY) IMPLANT
CLOTH BEACON ORANGE TIMEOUT ST (SAFETY) ×2 IMPLANT
COVER LIGHT HANDLE STERIS (MISCELLANEOUS) ×4 IMPLANT
DRAPE WARM FLUID 44X44 (DRAPES) ×2 IMPLANT
DRSG OPSITE POSTOP 4X10 (GAUZE/BANDAGES/DRESSINGS) IMPLANT
DRSG OPSITE POSTOP 4X8 (GAUZE/BANDAGES/DRESSINGS) IMPLANT
ELECTRODE REM PT RTRN 9FT ADLT (ELECTROSURGICAL) ×2 IMPLANT
GLOVE BIO SURGEON STRL SZ 6.5 (GLOVE) ×4 IMPLANT
GLOVE BIOGEL PI IND STRL 6.5 (GLOVE) ×2 IMPLANT
GLOVE BIOGEL PI IND STRL 7.0 (GLOVE) ×6 IMPLANT
GOWN STRL REUS W/TWL LRG LVL3 (GOWN DISPOSABLE) ×6 IMPLANT
HANDLE SUCTION POOLE (INSTRUMENTS) ×2 IMPLANT
INST SET MAJOR GENERAL (KITS) ×2 IMPLANT
KIT TURNOVER KIT A (KITS) ×2 IMPLANT
LIGASURE IMPACT 36 18CM CVD LR (INSTRUMENTS) IMPLANT
MANIFOLD NEPTUNE II (INSTRUMENTS) ×2 IMPLANT
NDL HYPO 18GX1.5 BLUNT FILL (NEEDLE) ×2 IMPLANT
NDL HYPO 21X1.5 SAFETY (NEEDLE) ×2 IMPLANT
NEEDLE HYPO 18GX1.5 BLUNT FILL (NEEDLE) ×2 IMPLANT
NEEDLE HYPO 21X1.5 SAFETY (NEEDLE) ×2 IMPLANT
NS IRRIG 1000ML POUR BTL (IV SOLUTION) ×4 IMPLANT
PACK MAJOR ABDOMINAL (CUSTOM PROCEDURE TRAY) ×2 IMPLANT
PAD ARMBOARD POSITIONER FOAM (MISCELLANEOUS) ×2 IMPLANT
PENCIL HANDSWITCHING (ELECTRODE) IMPLANT
PENCIL SMOKE EVACUATOR COATED (MISCELLANEOUS) ×2 IMPLANT
POSITIONER HEAD 8X9X4 ADT (SOFTGOODS) ×2 IMPLANT
POUCH OSTOMY 2 PC DRNBL 2.75 (WOUND CARE) IMPLANT
RELOAD PROXIMATE 75MM BLUE (ENDOMECHANICALS) ×2 IMPLANT
RELOAD STAPLE 75 3.8 BLU REG (ENDOMECHANICALS) IMPLANT
SET BASIN LINEN APH (SET/KITS/TRAYS/PACK) ×2 IMPLANT
SPONGE T-LAP 18X18 ~~LOC~~+RFID (SPONGE) ×2 IMPLANT
STAPLER PROXIMATE 75MM BLUE (STAPLE) IMPLANT
STAPLER VISISTAT (STAPLE) ×2 IMPLANT
SUT CHROMIC 3 0 SH 27 (SUTURE) IMPLANT
SUT NOVA NAB GS-26 0 60 (SUTURE) IMPLANT
SUT PDS AB CT VIOLET #0 27IN (SUTURE) ×4 IMPLANT
SUT SILK 3 0 SH CR/8 (SUTURE) ×2 IMPLANT
SUT VIC AB 3-0 SH 27X BRD (SUTURE) IMPLANT
SYR 30ML LL (SYRINGE) ×4 IMPLANT
TOWEL OR 17X26 4PK STRL BLUE (TOWEL DISPOSABLE) IMPLANT

## 2023-08-20 NOTE — Consult Note (Addendum)
 WOC Nurse ostomy consult note Pt had ileostomy revision surgery performed today and currently is in the perioperative setting. She is familiar to the [facility] team from previous ileostomy surgery in 12/23 and has been independent with ostomy care prior to admission, according to progress notes. WOC team will follow tomorrow to assess for the type of pouching system she will require. Surgery note states: (Staples on the lateral edge-covered with the wafer); do not want the staples exposed keep them covered. Thank-you,  Wiliam Harder MSN, RN, CWOCN, [employer], CNS [phone]

## 2023-08-20 NOTE — TOC Progression Note (Signed)
 Transition of Care [facility]) - Progression Note    Patient Details  Name: Dorothy Harris MRN: [id number] Date of Birth: 01/12/1961  [facility]) CM/SW Contact  [place] Lax, [place] Phone Number: 08/20/2023, 1:02 PM  Clinical Narrative:    CSW attempted to speak with pts insurance company at this time to see what SNF facilities are in network due to no bed offers from local SNF facilities. Insurance agency would not speak with this Child psychotherapist and states pt will need to call and find out what her benefits are. CSW sent pts SNF referral out to additional facilities at this time. TOC to follow.   Expected Discharge Plan: Home/Self Care Barriers to Discharge: Continued Medical Work up  Expected Discharge Plan and Services In-house Referral: Clinical Social Work Discharge Planning Services: CM Consult   Living arrangements for the past 2 months: Single Family Home                                       Social Determinants of Health (SDOH) Interventions SDOH Screenings   Food Insecurity: Food Insecurity Present (08/07/2023)  Housing: High Risk (08/07/2023)  Transportation Needs: No Transportation Needs (08/07/2023)  Utilities: Not At Risk (08/07/2023)  Alcohol Screen: Low Risk  (05/07/2021)  Depression (PHQ2-9): Medium Risk (05/05/2023)  Financial Resource Strain: Low Risk  (05/07/2021)  Physical Activity: Inactive (05/07/2021)  Tobacco Use: High Risk (08/20/2023)    Readmission Risk Interventions    08/11/2023    8:27 AM 08/07/2023    7:29 AM 01/22/2022   12:57 PM  Readmission Risk Prevention Plan  Medication Screening  Complete   Transportation Screening Complete Complete Complete  PCP or Specialist Appt within 3-5 Days   Complete  HRI or Home Care Consult Complete  Complete  Social Work Consult for Recovery Care Planning/Counseling Complete  Complete  Palliative Care Screening Not Applicable  Not Applicable  Medication Review Oceanographer) Complete  Referral to  Pharmacy

## 2023-08-20 NOTE — Transfer of Care (Addendum)
 Immediate Anesthesia Transfer of Care Note  Patient: Dorothy Harris  Procedure(s) Performed: SMALL BOWEL RESECTION, PRIMARY CLOSURE OF HERNIA (Abdomen) LAPAROTOMY, FOR LYSIS OF ADHESIONS (Abdomen) REVISION, ILEOSTOMY (Abdomen)  Patient Location: PACU  Anesthesia Type:General  Level of Consciousness: awake, alert , drowsy, patient cooperative, and responds to stimulation  Airway & Oxygen Therapy: Patient Spontanous Breathing and Patient connected to face mask oxygen  Post-op Assessment: Report given to RN, Post -op Vital signs reviewed and stable, and Patient moving all extremities X 4  Post vital signs: Reviewed and stable  Last Vitals:  Vitals Value Taken Time  BP 111/89 08/20/23 1215  Temp 36.8 C 08/20/23 1211  Pulse 109 08/20/23 1217  Resp    SpO2 91 % 08/20/23 1217  Vitals shown include unfiled device data.  Last Pain:  Vitals:   08/20/23 0803  TempSrc:   PainSc: 7       Patients Stated Pain Goal: 0 (08/19/23 1450)  Complications: There were no known notable events for this encounter.

## 2023-08-20 NOTE — Op Note (Signed)
 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 Collene Dawson, MD   Assistants: No qualified resident was available    Anesthesia: General endotracheal   Anesthesiologist: Coretha Dew, MD    Specimens:  Culture - mesenteric abscess; Small bowel resection including perforation and end ileostomy (proximal bowel marked by staple line)    Estimated Blood Loss: Minimal   Blood Replacement: None    Complications: None   Wound Class: Dirty infected    Operative Indications: Ms. Elcock is a 25 you with small bowel obstruction related to a ventral and parastomal hernia who was high risk for surgery from a cardiac standpoint. We attempted non operative management and this did not resolve her obstruction. We discussed exploration and risk of bowel resection, need to revise the ileostomy, risk of recurrent ventral hernias and parastomal hernias, and perioperative cardiac issues and potential need for transfer.   Findings: Upon entering the abdomen (organ space), I encountered an abscess in the mesentery of the small bowel that was incarcerated in the ventral hernia with a contained perforation of the small bowel in the hernia. Given the location of the abscess in the hernia the patient has both a deep tissue and deep organ space infection as the hernia is contiguous with the  peritoneal cavity.  300 cm of small bowel remain after the resection    Procedure: The patient was taken to the operating room and placed supine. General endotracheal anesthesia was induced. Intravenous antibiotics were  administered per protocol.  A nasogastric tube positioned to decompress the stomach. She had a foley on entry to the OR. The abdomen was prepped and draped in the usual sterile fashion.  Her ileostomy was not closed as it had no output.   A midline incision was made and carried down to the  fascia in the upper abdomen. She had an exposed Novafil suture already coming out of the skin from her prior surgery in the area closer to the umbilicus.  Once I was down to the fascia, I excised the skin including the old Novafil that was exposed /eroded through the skin.    I carefully entered the fascia with scissors, and was able to finger sweep omentum from the anterior abdominal wall. I removed Novafil interrupted sutures used in her last repair as I encountered them. The small bowel was very dilated and was eviscerated.  I came to the ventral hernia that was just lateral to the umbilicus, and could not get the bowel to reduce down, making it incarcerated. I lysed adhesions of omentum going into the hernia sac as well as some adhesions between dilated loops. With gentle traction, I reduce the dilated small intestine going into this ventral.  Upon reduction of the intestine, I encountered an abscess in the mesentery of the small bowel that was incarcerated in the ventral hernia with a contained perforation of the small bowel in the hernia. Given the location of the abscess in the hernia the patient has both a  deep tissue and deep organ space infection as the hernia is contiguous with the peritoneal cavity. This abscess was cultured.   I continued to follow the small intestine to the parastomal hernia. The bowel was decompressed. I could get part of the bowel reduced.  Given the contained  perforation with mesenteric abscess, I decided to determine the length of the bowel from the perforation back to the ligmant of treitz as I felt this patient was at risk of not healing an anastomosis.  I ran the bowel and noted no injuries in the bowel. There was 300 cm of small bowel from the ligament of treitz to just proximal to the perforation. I took down the ileostomy cutting the ostomy away form the skin and reducing it through the parastomal hernia defect.  I used a 75  mm linear cutting stapler and a ligasure to excise. The mesentery. The small bowel containing the perforation, mesenteric abscess and end ileostomy was sent for pathology.   Irrigation was performed.  I removed the hernia sac from the ventral hernia as well as the parastomal hernia to help with healing and preventing seroma formation.  The ventral hernia was just right of midline and I could get the fascia to reach to the midline.  The defect was 5cm in size.  The parastomal hernia was not larger as the ileostomy defect was about 4cm in size. The NG was confirmed manually.   The bowel was ran again and no injuries were noted.  I opened the end of then small bowel and evacuated copious amounts of succus and then closed the bowel again with a 75 mm linear cutting stapler.  Irrigation was perforated again the team changed gloves. With the bowel in anatomic alignment and I pulled the end ileostomy through the defect. I then closed the fascia around the ostomy with interrupted 0 Novafil to close the parastomal hernia primarily. I did this with great care, and noted no narrowing of the ostomy or injury to the bowel.  The total parastomal hernia defect closed with 2 cm.   I then closed the midline and included the ventral hernia defect (5cm in size) with the midline closure. This was closed with 0 Novafil interrupted sutures. There was no tension on the abdominal wall.  I closed the deep space of the midline with 3-0 Vicryl interrupted to  cover the suture and close the space down to prevent seroma. The parastomal hernia adipose was reapproximated around the new ileostomy with 3-0 Vicryl interrupted to help prevent against seromas.   Marcaine was injected. The skin was closed with staples after more irrigation was performed. The wound was covered.   The ileostomy was matured in the standard fashion with 3-0 Chromic gut in a brooke fashion. The lateral opening of the ostomy site was too large and I closed this was staples out laterally.  The ostomy was digitized and was open with succus coming out. A wafer and bag were placed.   The midline dressing was placed.   Final inspection revealed acceptable hemostasis. All counts were correct at the end of the case. The patient was awakened from anesthesia and extubated without complication.  The patient went to the PACU in stable condition.   Deena Farrier, MD [facility] [address] [place], [place] [address] [phone] (office)

## 2023-08-20 NOTE — Progress Notes (Addendum)
 [facility]  Updated [place] and the team. Ex lap, LOA, SBR and revision of the ileostomy, reduction of incarcerated hernia. She had a contained perforation of the mesentery from the small bowel, culture obtained.  PRN for pain IS, OOB Monitoring Npo NG CXR to confirm NG, NG also confirmed in the OR by hand TPN Awaiting bowel function Ostomy care Labs tomorrow Zosyn  for 5 days, culture pending SCDs  Dr. Cherilyn Corn seeing over the weekend.  Dorothy Farrier, MD [facility] [address] [place], [place] [address] [phone] (office)

## 2023-08-20 NOTE — Progress Notes (Signed)
 Dr.Kiel states no need to start an IV in preop.  Anesthesia can stop the TPN for the case and use that lumen on her PICC line to her rt arm.

## 2023-08-20 NOTE — Interval H&P Note (Signed)
 History and Physical Interval Note:  08/20/2023 8:35 AM  Dorothy Harris  has presented today for surgery, with the diagnosis of small bowel obstruction.  The various methods of treatment have been discussed with the patient and family. After consideration of risks, benefits and other options for treatment, the patient has consented to  Procedure(s) with comments: possible bowel resection, possible ileostomy revision, primary closure of hernia (N/A) - possible bowel resection, possible ileostomy revision, primary closure of hernia as a surgical intervention.  The patient's history has been reviewed, patient examined, no change in status, stable for surgery.  I have reviewed the patient's chart and labs.  Questions were answered to the patient's satisfaction.    SBO has not resolved with conservative management, will proceed with surgery. Have discussed cardiac risk, risk of needing transferred, post operative course, and risk with surgery with patient.   Awilda Bogus

## 2023-08-20 NOTE — Anesthesia Procedure Notes (Signed)
 Procedure Name: Intubation Date/Time: 08/20/2023 9:06 AM  Performed by: Alex Hylan, CRNAPre-anesthesia Checklist: Patient identified, Emergency Drugs available, Suction available, Patient being monitored and Timeout performed Patient Re-evaluated:Patient Re-evaluated prior to induction Oxygen Delivery Method: Circle system utilized Preoxygenation: Pre-oxygenation with 100% oxygen Induction Type: IV induction, Rapid sequence and Cricoid Pressure applied Laryngoscope Size: Miller and 2 Grade View: Grade II Tube type: Oral Tube size: 7.0 mm Number of attempts: 1 Airway Equipment and Method: Stylet Placement Confirmation: ETT inserted through vocal cords under direct vision, breath sounds checked- equal and bilateral and CO2 detector Secured at: 21 cm Tube secured with: Tape Dental Injury: Teeth and Oropharynx as per pre-operative assessment

## 2023-08-20 NOTE — Anesthesia Preprocedure Evaluation (Signed)
 Anesthesia Evaluation  Patient identified by MRN, date of birth, ID band Patient awake    Reviewed: Allergy & Precautions, H&P , NPO status , Patient's Chart, lab work & pertinent test results, reviewed documented beta blocker date and time   Airway Mallampati: II  TM Distance: >3 FB Neck ROM: full    Dental no notable dental hx.    Pulmonary neg pulmonary ROS, Current Smoker and Patient abstained from smoking.   Pulmonary exam normal breath sounds clear to auscultation       Cardiovascular Exercise Tolerance: Good hypertension, + CAD, + Past MI and +CHF  negative cardio ROS  Rhythm:regular Rate:Normal     Neuro/Psych  PSYCHIATRIC DISORDERS  Depression    negative neurological ROS  negative psych ROS   GI/Hepatic negative GI ROS, Neg liver ROS,GERD  ,,  Endo/Other  negative endocrine ROS    Renal/GU Renal diseasenegative Renal ROS  negative genitourinary   Musculoskeletal   Abdominal   Peds  Hematology negative hematology ROS (+) Blood dyscrasia, anemia   Anesthesia Other Findings   Reproductive/Obstetrics negative OB ROS                             Anesthesia Physical Anesthesia Plan  ASA: 4 and emergent  Anesthesia Plan: General and General ETT   Post-op Pain Management:    Induction:   PONV Risk Score and Plan: Ondansetron   Airway Management Planned:   Additional Equipment:   Intra-op Plan:   Post-operative Plan:   Informed Consent: I have reviewed the patients History and Physical, chart, labs and discussed the procedure including the risks, benefits and alternatives for the proposed anesthesia with the patient or authorized representative who has indicated his/her understanding and acceptance.     Dental Advisory Given  Plan Discussed with: CRNA  Anesthesia Plan Comments:        Anesthesia Quick Evaluation

## 2023-08-20 NOTE — Progress Notes (Signed)
 PHARMACY - TOTAL PARENTERAL NUTRITION CONSULT NOTE   Indication: Small bowel obstruction  Patient Measurements: Height: 5\' 7"  (170.2 cm) Weight: 103.7 kg (228 lb 9.9 oz) IBW/kg (Calculated) : 61.6 TPN AdjBW (KG): 72.1 Body mass index is 35.81 kg/m. Usual Weight: 90kg  Assessment:  Small bowel obstruction with ventral hernia and end ileostomy.  Glucose / Insulin : 150-156, 8 units insulin  given  Electrolytes: K 4.8 - will hold k in TPN Corrected calcium  10 Renal: Alb 1.9, Scr 2.2 Hepatic: 5/14 Alk phos 160, AST/ALT 41/68 - has been normal on labs prior to 5/14 TG 5/12 218  Central access: PICC TPN start date:  5/8  Nutritional Goals: Goal TPN rate is 80 mL/hr (provides 115 g of protein and 1920 kcals per day)  RD Assessment: Estimated Needs Total Energy Estimated Needs: 1900-2100 Total Protein Estimated Needs: 110-130 gm Total Fluid Estimated Needs: 1.9-2.1 L  Current Nutrition:  Off ensure while NPO for NG procedure. 100% nutrition from TPN  Plan:  Continue TPN at 80 mL/hr at 1800 Plan on continuing TPN until patient able to tolerate regular diet For surgery today 5/15 Electrolytes in TPN: Na 67mEq/L, K 0 mEq/L, Ca 23mEq/L, Mg 25mEq/L, and Phos 15 mmol/L. Cl:Ac 1:1 Standard MVI and trace elements to TPN Continue Moderate q8h SSI and adjust as needed  Monitor TPN labs on Mon/Thurs  Audra Blend PharmD., BCPS Clinical Pharmacist 08/20/2023 8:31 AM

## 2023-08-20 NOTE — Progress Notes (Signed)
 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  Patient currently noted to have high-grade SBO with plans for operative repair scheduled 5/15.  AKI has improved and she remains on amiodarone  infusion for atrial fibrillation with RVR.  She has undergone exploratory laparotomy with lysis of adhesions and reduction of ventral hernia with resection of small bowel and end ileostomy on 5/15.  No immediate complications noted.  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   Atrial fibrillation with rapid ventricular response (HCC)   Perforated bowel (HCC)   Mesenteric abscess (HCC)  Assessment and Plan:  1)High-Grade SBO status post exploratory lap with lysis of adhesions and resection of small bowel with end ileostomy revision 5/15 - Appreciate ongoing general surgery evaluation - Continue TPN -Continue IV antibiotics -Continue pain management -Antiemetics -NG tube to low intermittent suction, further management per general surgery   2) AKI----acute kidney injury on CKD stage IV -due to dehydration/volume  depletion in a patient with SBO and n.p.o. status with high output from NG tube -creatinine on admission= 4.59,  baseline creatinine = 2.0 to 2.2  ,  -creatinine peaked at 8.07 (on 08/09/23) - Continues to remain stable - Nephrology consultation appreciated - No metabolic acidosis and no hyperkalemia at this time, elevated anion gap noted - patient is volume depleted, -continue to maintain adequate hydration, follow nephrology rec's -Renally adjust medications, avoid nephrotoxic agents / hypotension     3) atypical chest pains with  elevated Troponin-resolved -EKG nonacute suspect demand ischemia due to AKI and  bowel obstruction.  Troponin 214 >>206-- echo requested - Lower extremity Dopplers and VQ scan negative for DVT and PE respectively 08/11/23 - Patient remains at high risk for any surgical intervention. - Continue rate control with the use of IV amiodarone  - No ACS concerns per cardiology recommendations; 2D echo reassuring and demonstrating preserved ejection fraction. -continue holding B-blocker at the moment, due to soft BP.   4)Essential hypertension: BP on the soft sides and will require pressors.  - c/n to Hold PTA amlodipine  and oral hydralazine   - Continue to follow VS   5)Paroxysmal A-fib: -PTA amiodarone  and Coreg  on hold due to n.p.o. status -Patient was not on anticoagulation PTA -follow echo results - Continue IV amiodarone  drip; intermittent boluses will be provided to assist with rate control.[name]   6)HFpEF w/ tte 8/24-EF 65 to 70%, G1DD: Currently volume depleted needing IV fluids due to SBO.   - Be judicious with IV fluids - follow Echo results -continue to follow I's and O's   7)HLD - Continue to hold PTA atorvastatin  while n.p.o.  8)acute urinary retention -in and out cath X 3 without success of voiding spontaneously; foley catheter placed and good urine output appreciated. - Creatinine has further improved and back to her baseline at the moment. - No  suprapubic tenderness reported.  9) obesity, class II - BMI 35.81    DVT prophylaxis: Heparin  Code Status: Full Family Communication: Aunt at bedside 5/15 Disposition Plan:  Status is: Inpatient Remains inpatient appropriate because: Need for IV medications and fluids  Nutrition Status: Nutrition Problem: Inadequate oral intake Etiology: altered GI function Signs/Symptoms: NPO status Interventions: TPN  Consultants:  General surgery Cardiology  Procedures:  Ex lap with LOA and reduction of ventral hernia with resection of small bowel and end ileostomy revision 5/15  Antimicrobials:  Anti-infectives (From admission, onward)    Start     Dose/Rate Route Frequency Ordered Stop   08/20/23 1800  piperacillin -tazobactam (ZOSYN ) IVPB 3.375 g        3.375 g 12.5 mL/hr over 240 Minutes Intravenous Every 8 hours 08/20/23 1431 08/25/23 2159   08/20/23 0800  cefoTEtan (CEFOTAN) 2 g in sodium chloride  0.9 % 100 mL IVPB        2 g 200 mL/hr over 30 Minutes Intravenous On call to O.R. 08/19/23 1610 08/20/23 0916      Subjective: Patient seen and evaluated today after surgical procedure with complaints of some moderate to severe abdominal pain.  Tolerated procedure well with no immediate complications.  Continues to have elevation in heart rates on amiodarone  infusion.  Objective: Vitals:   08/20/23 1300 08/20/23 1315 08/20/23 1330 08/20/23 1340  BP: 123/74 121/73 119/72   Pulse: (!) 110 (!) 109 (!) 111   Resp:      Temp:    98 F (36.7 C)  TempSrc:      SpO2: 97% 99% 91% 94%  Weight:      Height:        Intake/Output Summary (Last 24 hours) at 08/20/2023 1516 Last data filed at 08/20/2023 1450 Gross per 24 hour  Intake 3983.57 ml  Output 2375 ml  Net 1608.57 ml   Filed Weights   08/19/23 0500 08/20/23 0403 08/20/23 0745  Weight: 99.2 kg 103.7 kg 103.7 kg    Examination:  General exam: Appears calm and comfortable  Respiratory system: Clear to auscultation.  Respiratory effort normal. Cardiovascular system: S1 & S2 heard, irregular and tachycardic. Gastrointestinal system: Abdominal binder present and colostomy bag with bloody drainage, NG tube to suction Central nervous system: Alert and awake Extremities: No edema Skin: No significant lesions noted Psychiatry: Flat affect.    Data Reviewed: I have personally reviewed following labs and imaging studies  CBC: Recent Labs  Lab 08/20/23 0419  WBC 10.8*  NEUTROABS 8.4*  HGB 8.7*  HCT 27.8*  MCV 101.5*  PLT 300   Basic Metabolic Panel: Recent Labs  Lab 08/14/23 0637 08/15/23 0549 08/16/23 0623 08/17/23 0548 08/18/23 0535 08/19/23 0411 08/20/23 0419 08/20/23 0420  NA 141 142   < > 140 141 140 142 142  K 3.5 4.0   < > 4.1 4.3 4.7 4.8 4.8  CL 100 105   < > 106 108 111 112* 112*  CO2 30 27   < > 23 25 22  20* 20*  GLUCOSE 166* 188*   < > 214* 174* 171* 154* 156*  BUN 57* 58*   < > 71* 77* 79* 77* 77*  CREATININE 2.27* 2.06*   < > 2.09* 2.25* 2.28* 2.14* 2.23*  CALCIUM  8.4*  8.0*   < > 8.0* 8.2* 8.1* 8.5* 8.5*  MG 1.9 1.8  --  1.9  --   --  2.3  --   PHOS 3.2 2.6   < > 4.2 3.8 3.5 3.3 3.3   < > = values in this interval not displayed.   GFR: Estimated Creatinine Clearance: 32.4 mL/min (A) (by C-G formula based on SCr of 2.23 mg/dL (H)). Liver Function Tests: Recent Labs  Lab 08/14/23 8119 08/15/23 0549 08/17/23 0548 08/18/23 0535 08/19/23 0411 08/20/23 0419 08/20/23 0420  AST 19  --  17  --   --  41  --   ALT 12  --  12  --   --  68*  --   ALKPHOS 61  --  86  --   --  160*  --   BILITOT 0.5  --  0.3  --   --  0.4  --   PROT 5.8*  --  5.1*  --   --  5.4*  --   ALBUMIN  2.3*   < > 1.8* 1.7* 1.7* 1.9* 1.9*   < > = values in this interval not displayed.   No results for input(s): "LIPASE", "AMYLASE" in the last 168 hours. No results for input(s): "AMMONIA" in the last 168 hours. Coagulation Profile: No results for input(s): "INR", "PROTIME" in the last 168  hours. Cardiac Enzymes: No results for input(s): "CKTOTAL", "CKMB", "CKMBINDEX", "TROPONINI" in the last 168 hours. BNP (last 3 results) No results for input(s): "PROBNP" in the last 8760 hours. HbA1C: No results for input(s): "HGBA1C" in the last 72 hours. CBG: Recent Labs  Lab 08/19/23 0741 08/19/23 1154 08/19/23 1704 08/20/23 0003 08/20/23 0802  GLUCAP 167* 156* 150* 154* 156*   Lipid Profile: No results for input(s): "CHOL", "HDL", "LDLCALC", "TRIG", "CHOLHDL", "LDLDIRECT" in the last 72 hours.  Thyroid Function Tests: No results for input(s): "TSH", "T4TOTAL", "FREET4", "T3FREE", "THYROIDAB" in the last 72 hours. Anemia Panel: No results for input(s): "VITAMINB12", "FOLATE", "FERRITIN", "TIBC", "IRON", "RETICCTPCT" in the last 72 hours. Sepsis Labs: No results for input(s): "PROCALCITON", "LATICACIDVEN" in the last 168 hours.  Recent Results (from the past 240 hours)  MRSA Next Gen by PCR, Nasal     Status: None   Collection Time: 08/12/23  8:53 AM   Specimen: Nasal Mucosa; Nasal Swab  Result Value Ref Range Status   MRSA by PCR Next Gen NOT DETECTED NOT DETECTED Final    Comment: (NOTE) The GeneXpert MRSA Assay (FDA approved for NASAL specimens only), is one component of a comprehensive MRSA colonization surveillance program. It is not intended to diagnose MRSA infection nor to guide or monitor treatment for MRSA infections. Test performance is not FDA approved in patients less than 59 years old. Performed at [facility], [address], [place], [place] [address]          Radiology Studies: No results found.       Scheduled Meds:  Chlorhexidine  Gluconate Cloth  6 each Topical Daily   heparin  injection (subcutaneous)  5,000 Units Subcutaneous Q8H   insulin  aspart  0-15 Units Subcutaneous TID WC   melatonin  6 mg Oral Once   metoprolol  tartrate  5 mg Intravenous Q4H   pantoprazole  (PROTONIX ) IV  40 mg Intravenous Q12H   sodium chloride  flush  10-40  mL Intracatheter Q12H   Continuous Infusions:  amiodarone  30 mg/hr (08/20/23 1416)   piperacillin -tazobactam (ZOSYN )  IV     TPN ADULT (ION) 80 mL/hr  at 08/20/23 0650   TPN ADULT (ION)       LOS: 13 days    Time spent: 55 minutes    [place] Gan Loran Rock, DO [facility]  If 7PM-7AM, please contact night-coverage [email or web address] 08/20/2023, 3:16 PM

## 2023-08-20 NOTE — Progress Notes (Signed)
 Patient off floor for surgery. We have been following telemetry. Continue IV amiodarone  in periop period, transition to oral when able regarding her SBO and surgery. No additional recs today, call with questions.   Dorothy Lander MD

## 2023-08-21 ENCOUNTER — Encounter (HOSPITAL_COMMUNITY): Payer: Self-pay | Admitting: General Surgery

## 2023-08-21 DIAGNOSIS — K56609 Unspecified intestinal obstruction, unspecified as to partial versus complete obstruction: Secondary | ICD-10-CM | POA: Diagnosis not present

## 2023-08-21 LAB — RENAL FUNCTION PANEL
Albumin: 1.8 g/dL — ABNORMAL LOW (ref 3.5–5.0)
Anion gap: 10 (ref 5–15)
BUN: 99 mg/dL — ABNORMAL HIGH (ref 8–23)
CO2: 18 mmol/L — ABNORMAL LOW (ref 22–32)
Calcium: 7.9 mg/dL — ABNORMAL LOW (ref 8.9–10.3)
Chloride: 111 mmol/L (ref 98–111)
Creatinine, Ser: 3.01 mg/dL — ABNORMAL HIGH (ref 0.44–1.00)
GFR, Estimated: 17 mL/min — ABNORMAL LOW (ref >60)
Glucose, Bld: 247 mg/dL — ABNORMAL HIGH (ref 70–99)
Phosphorus: 4.3 mg/dL (ref 2.5–4.6)
Potassium: 5.9 mmol/L — ABNORMAL HIGH (ref 3.5–5.1)
Sodium: 139 mmol/L (ref 135–145)

## 2023-08-21 LAB — MAGNESIUM: Magnesium: 2.1 mg/dL (ref 1.7–2.4)

## 2023-08-21 LAB — GLUCOSE, CAPILLARY
Glucose-Capillary: 190 mg/dL — ABNORMAL HIGH (ref 70–99)
Glucose-Capillary: 201 mg/dL — ABNORMAL HIGH (ref 70–99)
Glucose-Capillary: 213 mg/dL — ABNORMAL HIGH (ref 70–99)
Glucose-Capillary: 241 mg/dL — ABNORMAL HIGH (ref 70–99)

## 2023-08-21 LAB — CBC WITH DIFFERENTIAL/PLATELET
Abs Immature Granulocytes: 0.15 10*3/uL — ABNORMAL HIGH (ref 0.00–0.07)
Basophils Absolute: 0.1 10*3/uL (ref 0.0–0.1)
Basophils Relative: 0 %
Eosinophils Absolute: 0.1 10*3/uL (ref 0.0–0.5)
Eosinophils Relative: 1 %
HCT: 28.8 % — ABNORMAL LOW (ref 36.0–46.0)
Hemoglobin: 8.8 g/dL — ABNORMAL LOW (ref 12.0–15.0)
Immature Granulocytes: 1 %
Lymphocytes Relative: 5 %
Lymphs Abs: 1.1 10*3/uL (ref 0.7–4.0)
MCH: 31.9 pg (ref 26.0–34.0)
MCHC: 30.6 g/dL (ref 30.0–36.0)
MCV: 104.3 fL — ABNORMAL HIGH (ref 80.0–100.0)
Monocytes Absolute: 2.3 10*3/uL — ABNORMAL HIGH (ref 0.1–1.0)
Monocytes Relative: 11 %
Neutro Abs: 17.4 10*3/uL — ABNORMAL HIGH (ref 1.7–7.7)
Neutrophils Relative %: 82 %
Platelets: 348 10*3/uL (ref 150–400)
RBC: 2.76 MIL/uL — ABNORMAL LOW (ref 3.87–5.11)
RDW: 16.9 % — ABNORMAL HIGH (ref 11.5–15.5)
WBC: 21.1 10*3/uL — ABNORMAL HIGH (ref 4.0–10.5)
nRBC: 0 % (ref 0.0–0.2)

## 2023-08-21 LAB — BASIC METABOLIC PANEL WITH GFR
Anion gap: 9 (ref 5–15)
BUN: 99 mg/dL — ABNORMAL HIGH (ref 8–23)
CO2: 18 mmol/L — ABNORMAL LOW (ref 22–32)
Calcium: 7.9 mg/dL — ABNORMAL LOW (ref 8.9–10.3)
Chloride: 112 mmol/L — ABNORMAL HIGH (ref 98–111)
Creatinine, Ser: 2.99 mg/dL — ABNORMAL HIGH (ref 0.44–1.00)
GFR, Estimated: 17 mL/min — ABNORMAL LOW (ref >60)
Glucose, Bld: 245 mg/dL — ABNORMAL HIGH (ref 70–99)
Potassium: 5.9 mmol/L — ABNORMAL HIGH (ref 3.5–5.1)
Sodium: 139 mmol/L (ref 135–145)

## 2023-08-21 LAB — POTASSIUM: Potassium: 5.1 mmol/L (ref 3.5–5.1)

## 2023-08-21 LAB — PHOSPHORUS: Phosphorus: 4.3 mg/dL (ref 2.5–4.6)

## 2023-08-21 MED ORDER — SODIUM BICARBONATE 8.4 % IV SOLN
INTRAVENOUS | Status: AC
  Filled 2023-08-21 (×2): qty 1000

## 2023-08-21 MED ORDER — SODIUM BICARBONATE 8.4 % IV SOLN
50.0000 meq | Freq: Once | INTRAVENOUS | Status: AC
  Administered 2023-08-21: 50 meq via INTRAVENOUS
  Filled 2023-08-21: qty 50

## 2023-08-21 MED ORDER — INSULIN ASPART 100 UNIT/ML IV SOLN
10.0000 [IU] | Freq: Once | INTRAVENOUS | Status: AC
  Administered 2023-08-21: 10 [IU] via INTRAVENOUS

## 2023-08-21 MED ORDER — INSULIN ASPART 100 UNIT/ML IJ SOLN
0.0000 [IU] | Freq: Four times a day (QID) | INTRAMUSCULAR | Status: DC
  Administered 2023-08-21: 5 [IU] via SUBCUTANEOUS
  Administered 2023-08-21 – 2023-08-22 (×5): 3 [IU] via SUBCUTANEOUS
  Administered 2023-08-23 (×5): 2 [IU] via SUBCUTANEOUS
  Administered 2023-08-24 – 2023-08-25 (×4): 3 [IU] via SUBCUTANEOUS
  Administered 2023-08-25 – 2023-08-26 (×4): 2 [IU] via SUBCUTANEOUS

## 2023-08-21 MED ORDER — CALCIUM GLUCONATE-NACL 1-0.675 GM/50ML-% IV SOLN
1.0000 g | Freq: Once | INTRAVENOUS | Status: AC
  Administered 2023-08-21: 1000 mg via INTRAVENOUS
  Filled 2023-08-21: qty 50

## 2023-08-21 MED ORDER — OXYCODONE HCL 5 MG PO TABS
5.0000 mg | ORAL_TABLET | ORAL | Status: DC | PRN
  Administered 2023-08-21 – 2023-08-24 (×7): 5 mg via ORAL
  Filled 2023-08-21 (×7): qty 1

## 2023-08-21 MED ORDER — HYDROMORPHONE HCL 1 MG/ML IJ SOLN
0.5000 mg | INTRAMUSCULAR | Status: DC | PRN
  Administered 2023-08-21 – 2023-08-22 (×5): 1 mg via INTRAVENOUS
  Administered 2023-08-22: 0.5 mg via INTRAVENOUS
  Administered 2023-08-22 – 2023-08-25 (×32): 1 mg via INTRAVENOUS
  Filled 2023-08-21 (×37): qty 1

## 2023-08-21 MED ORDER — TRAVASOL 10 % IV SOLN
INTRAVENOUS | Status: AC
  Filled 2023-08-21 (×2): qty 1152

## 2023-08-21 MED ORDER — ACETAMINOPHEN 500 MG PO TABS
1000.0000 mg | ORAL_TABLET | Freq: Four times a day (QID) | ORAL | Status: DC
  Administered 2023-08-21 – 2023-08-30 (×33): 1000 mg via ORAL
  Filled 2023-08-21 (×35): qty 2

## 2023-08-21 MED ORDER — SODIUM ZIRCONIUM CYCLOSILICATE 10 G PO PACK
10.0000 g | PACK | Freq: Once | ORAL | Status: AC
  Administered 2023-08-21: 10 g via ORAL
  Filled 2023-08-21: qty 1

## 2023-08-21 MED ORDER — MELATONIN 3 MG PO TABS
6.0000 mg | ORAL_TABLET | Freq: Every evening | ORAL | Status: DC | PRN
  Administered 2023-08-21 – 2023-08-28 (×7): 6 mg via ORAL
  Filled 2023-08-21 (×6): qty 2

## 2023-08-21 MED ORDER — DEXTROSE 50 % IV SOLN
1.0000 | Freq: Once | INTRAVENOUS | Status: AC
  Administered 2023-08-21: 50 mL via INTRAVENOUS
  Filled 2023-08-21: qty 50

## 2023-08-21 NOTE — Progress Notes (Signed)
 Telemetry reviewed, afib low 100s. She remains on amio gtt. She remains NPO after bowel surgery yesterday. Plan to transition to oral regimen when able from postsurgical standpoint. Have not started anticoag in setting of need for surgery for SBO. Her CHADS2Vasc score is 2 (HTN, gender), in a female patient would only be a IIBAindication to consider anticoagulation. Can consider as she recovers from surgery just yesterday   Armida Lander MD

## 2023-08-21 NOTE — Anesthesia Postprocedure Evaluation (Signed)
 Anesthesia Post Note  Patient: Dorothy Harris  Procedure(s) Performed: SMALL BOWEL RESECTION, PRIMARY CLOSURE OF HERNIA (Abdomen) LAPAROTOMY, FOR LYSIS OF ADHESIONS (Abdomen) REVISION, ILEOSTOMY (Abdomen)  Patient location during evaluation: Phase II Anesthesia Type: General Level of consciousness: awake Pain management: pain level controlled Vital Signs Assessment: post-procedure vital signs reviewed and stable Respiratory status: spontaneous breathing and respiratory function stable Cardiovascular status: blood pressure returned to baseline and stable Postop Assessment: no headache and no apparent nausea or vomiting Anesthetic complications: no Comments: Late entry   There were no known notable events for this encounter.   Last Vitals:  Vitals:   08/21/23 1500 08/21/23 1508  BP: (!) 99/51   Pulse: (!) 114   Resp: 19   Temp:  36.9 C  SpO2: 99%     Last Pain:  Vitals:   08/21/23 1635  TempSrc:   PainSc: 5                  Coretha Dew

## 2023-08-21 NOTE — Consult Note (Signed)
 WOC Nurse ostomy consult note Stoma type/location: ileostomy; RLQ Stomal assessment/size: 2" budded, edematous Peristomal assessment: NA Treatment options for stomal/peristomal skin: 2" barrier ring was used with previous stoma Output liquid green Ostomy pouching: 2pc. 2 3/4" in place from the OR Education provided:  met with patient, verified previous pouching; she is very familiar with 2pc and use of barrier ring, she was using 2 1/4" can continue this if new stoma will fit in this skin barrier. I have 2 3/4" in the room for use.  Supplied patient with new set of educational materials, ostomy clinic handout. She has an established relationship with [employer]les[employer] Grim for her supplies.  Enrolled patient in [employer] DC program: Yes, previously.   WOC will follow up with patient next week, she is in pain when I arrive. Has NG tube and still reports nausea. She is not engaged in discussion with WOC nurse. Will need to re-assess next week for any new needs. She may not need an additional teaching, she has been using same pouching system for 2 years and has been successful with this changing it every 3-4 days per the patient.

## 2023-08-21 NOTE — Progress Notes (Signed)
 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  Patient currently noted to have high-grade SBO with plans for operative repair scheduled 5/15.  AKI has improved and she remains on amiodarone  infusion for atrial fibrillation with RVR.  She has undergone exploratory laparotomy with lysis of adhesions and reduction of ventral hernia with resection of small bowel and end ileostomy on 5/15.  No immediate complications noted.  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   Atrial fibrillation with rapid ventricular response (HCC)   Perforated bowel (HCC)   Mesenteric abscess (HCC)  Assessment and Plan:  1)High-Grade SBO status post exploratory lap with lysis of adhesions and resection of small bowel with end ileostomy revision 5/15 - Appreciate ongoing general surgery evaluation - Continue TPN -Continue IV antibiotics -Continue pain management -Antiemetics -NG tube to remain in place for now -Started on clear liquid diet 5/16   2) AKI with metabolic acidosis and hyperkalemia----acute kidney injury on CKD  stage IV -due to dehydration/volume depletion in a patient with SBO and n.p.o. status with high output from NG tube -creatinine on admission= 4.59,  baseline creatinine = 2.0 to 2.2  ,  -creatinine peaked at 8.07 (on 08/09/23) - Continues to remain stable - Nephrology re-consultation appreciated - Appreciate recommendations for bicarbonate drip -Given D50/insulin  as well as bicarbonate push and calcium  gluconate -Plan to recheck potassium levels -Appreciate ongoing nephrology follow-up     3) atypical chest pains with  elevated Troponin-resolved -EKG nonacute suspect demand ischemia due to AKI and  bowel obstruction.  Troponin 214 >>206-- echo requested - Lower extremity Dopplers and VQ scan negative for DVT and PE respectively 08/11/23 - Patient remains at high risk for any surgical intervention. - Continue rate control with the use of IV amiodarone  - No ACS concerns per cardiology recommendations; 2D echo reassuring and demonstrating preserved ejection fraction. -continue holding B-blocker at the moment, due to soft BP.   4)Essential hypertension: BP on the soft sides and will require pressors.  - c/n to Hold PTA amlodipine  and oral hydralazine   - Continue to follow VS   5)Paroxysmal A-fib: -PTA amiodarone  and Coreg  on hold due to n.p.o. status -Patient was not on anticoagulation PTA - Echo 60-65% - Continue IV amiodarone  drip; intermittent boluses will be provided to assist with rate control - Plan to convert to oral amiodarone  once tolerating diet reliably and consistently   6)HFpEF with LVEF 60-65% Currently volume depleted needing IV fluids due to SBO.   - Started on sodium bicarbonate  infusion per nephrology -continue to follow I's and O's   7)HLD - Continue to  hold PTA atorvastatin  while n.p.o.  8)acute urinary retention -in and out cath X 3 without success of voiding spontaneously; foley catheter placed and good urine output appreciated. - Creatinine has further  improved and back to her baseline at the moment. - No suprapubic tenderness reported.  9) obesity, class II - BMI 35.81    DVT prophylaxis: Heparin  Code Status: Full Family Communication: 63 at bedside 5/15 Disposition Plan:  Status is: Inpatient Remains inpatient appropriate because: Need for IV medications and fluids  Nutrition Status: Nutrition Problem: Inadequate oral intake Etiology: altered GI function Signs/Symptoms: NPO status Interventions: TPN  Consultants:  General surgery Cardiology Nephrology  Procedures:  Ex lap with LOA and reduction of ventral hernia with resection of small bowel and end ileostomy revision 5/15  Antimicrobials:  Anti-infectives (From admission, onward)    Start     Dose/Rate Route Frequency Ordered Stop   08/20/23 1800  piperacillin -tazobactam (ZOSYN ) IVPB 3.375 g        3.375 g 12.5 mL/hr over 240 Minutes Intravenous Every 8 hours 08/20/23 1431 08/25/23 2159   08/20/23 0800  cefoTEtan (CEFOTAN) 2 g in sodium chloride  0.9 % 100 mL IVPB        2 g 200 mL/hr over 30 Minutes Intravenous On call to O.R. 08/19/23 9147 08/20/23 0946      Subjective: Patient seen and evaluated today with ongoing abdominal pain noted.  She has had minimal NG tube output, but ileostomy had 800 cc of liquid output since surgery.  No nausea or vomiting noted.  She is noted to have hyperkalemia and acidosis this morning.  Objective: Vitals:   08/21/23 0430 08/21/23 0500 08/21/23 0530 08/21/23 0600  BP: (!) 125/55 (!) 102/49 134/68 (!) 104/55  Pulse: (!) 109 (!) 110 (!) 110 (!) 111  Resp: (!) 28 14 (!) 25 15  Temp:      TempSrc:      SpO2: 98% 97% 97% 97%  Weight:      Height:        Intake/Output Summary (Last 24 hours) at 08/21/2023 1312 Last data filed at 08/21/2023 1007 Gross per 24 hour  Intake 1523.41 ml  Output 1150 ml  Net 373.41 ml   Filed Weights   08/20/23 0403 08/20/23 0745 08/21/23 0400  Weight: 103.7 kg 103.7 kg 101.5 kg     Examination:  General exam: Appears calm and comfortable  Respiratory system: Clear to auscultation. Respiratory effort normal. Cardiovascular system: S1 & S2 heard, irregular and tachycardic. Gastrointestinal system: Abdominal binder present and colostomy bag with bloody drainage, NG tube to suction Central nervous system: Alert and awake Extremities: No edema Skin: No significant lesions noted Psychiatry: Flat affect.    Data Reviewed: I have personally reviewed following labs and imaging studies  CBC: Recent Labs  Lab 08/20/23 0419 08/21/23 0436  WBC 10.8* 21.1*  NEUTROABS 8.4* 17.4*  HGB 8.7* 8.8*  HCT 27.8* 28.8*  MCV 101.5* 104.3*  PLT 300 348   Basic Metabolic Panel: Recent Labs  Lab 08/15/23 0549 08/16/23 0623 08/17/23 0548 08/18/23 0535 08/19/23 0411 08/20/23 0419 08/20/23 0420 08/21/23 0436 08/21/23 0438  NA 142   < > 140   < > 140 142 142 139 139  K 4.0   < > 4.1   < > 4.7 4.8 4.8 5.9* 5.9*  CL 105   < > 106   < > 111 112* 112* 112* 111  CO2 27   < > 23   < > 22 20*  20* 18* 18*  GLUCOSE 188*   < > 214*   < > 171* 154* 156* 245* 247*  BUN 58*   < > 71*   < > 79* 77* 77* 99* 99*  CREATININE 2.06*   < > 2.09*   < > 2.28* 2.14* 2.23* 2.99* 3.01*  CALCIUM  8.0*   < > 8.0*   < > 8.1* 8.5* 8.5* 7.9* 7.9*  MG 1.8  --  1.9  --   --  2.3  --  2.1  --   PHOS 2.6   < > 4.2   < > 3.5 3.3 3.3 4.3 4.3   < > = values in this interval not displayed.   GFR: Estimated Creatinine Clearance: 23.7 mL/min (A) (by C-G formula based on SCr of 3.01 mg/dL (H)). Liver Function Tests: Recent Labs  Lab 08/17/23 0548 08/18/23 0535 08/19/23 0411 08/20/23 0419 08/20/23 0420 08/21/23 0438  AST 17  --   --  41  --   --   ALT 12  --   --  68*  --   --   ALKPHOS 86  --   --  160*  --   --   BILITOT 0.3  --   --  0.4  --   --   PROT 5.1*  --   --  5.4*  --   --   ALBUMIN  1.8* 1.7* 1.7* 1.9* 1.9* 1.8*   No results for input(s): "LIPASE", "AMYLASE" in the last 168  hours. No results for input(s): "AMMONIA" in the last 168 hours. Coagulation Profile: No results for input(s): "INR", "PROTIME" in the last 168 hours. Cardiac Enzymes: No results for input(s): "CKTOTAL", "CKMB", "CKMBINDEX", "TROPONINI" in the last 168 hours. BNP (last 3 results) No results for input(s): "PROBNP" in the last 8760 hours. HbA1C: No results for input(s): "HGBA1C" in the last 72 hours. CBG: Recent Labs  Lab 08/20/23 1615 08/20/23 2123 08/21/23 0005 08/21/23 0816 08/21/23 1149  GLUCAP 244* 247* 241* 213* 190*   Lipid Profile: No results for input(s): "CHOL", "HDL", "LDLCALC", "TRIG", "CHOLHDL", "LDLDIRECT" in the last 72 hours.  Thyroid Function Tests: No results for input(s): "TSH", "T4TOTAL", "FREET4", "T3FREE", "THYROIDAB" in the last 72 hours. Anemia Panel: No results for input(s): "VITAMINB12", "FOLATE", "FERRITIN", "TIBC", "IRON", "RETICCTPCT" in the last 72 hours. Sepsis Labs: No results for input(s): "PROCALCITON", "LATICACIDVEN" in the last 168 hours.  Recent Results (from the past 240 hours)  MRSA Next Gen by PCR, Nasal     Status: None   Collection Time: 08/12/23  8:53 AM   Specimen: Nasal Mucosa; Nasal Swab  Result Value Ref Range Status   MRSA by PCR Next Gen NOT DETECTED NOT DETECTED Final    Comment: (NOTE) The GeneXpert MRSA Assay (FDA approved for NASAL specimens only), is one component of a comprehensive MRSA colonization surveillance program. It is not intended to diagnose MRSA infection nor to guide or monitor treatment for MRSA infections. Test performance is not FDA approved in patients less than 70 years old. Performed at [facility], [address] [place], [place] [address]   Aerobic/Anaerobic Culture w Gram Stain (surgical/deep wound)     Status: None (Preliminary result)   Collection Time: 08/20/23 10:11 AM   Specimen: Abdomen; Abscess  Result Value Ref Range Status   Specimen Description   Final    WOUND Performed at [facility], [address] [place], [place] [address]    Special Requests   Final  NONE Performed at [facility], [address] [place], [place] [address]    Gram Stain   Final    FEW WBC PRESENT, PREDOMINANTLY PMN MODERATE GRAM NEGATIVE RODS FEW GRAM POSITIVE COCCI IN PAIRS RARE GRAM POSITIVE RODS    Culture   Final    NO GROWTH < 24 HOURS Performed at [facility], [address], [place], [place] [address]    Report Status PENDING  Incomplete         Radiology Studies: DG Chest Port 1 View Result Date: 08/20/2023 CLINICAL DATA:  NG placement. EXAM: PORTABLE CHEST 1 VIEW COMPARISON:  Chest radiograph dated 08/12/2023. FINDINGS: Enteric tube extends below the diaphragm into the stomach and fold back on itself with tip in the region of the proximal stomach. There is shallow inspiration with bibasilar atelectasis. No pneumothorax. Stable cardiac silhouette. Median sternotomy wires and repair of the thoracic aorta. No acute osseous pathology. IMPRESSION: 1. Enteric tube with tip in the proximal stomach. 2. Shallow inspiration with bibasilar atelectasis. Electronically Signed   By: Angus Bark M.D.   On: 08/20/2023 15:36         Scheduled Meds:  acetaminophen   1,000 mg Oral Q6H   Chlorhexidine  Gluconate Cloth  6 each Topical Daily   heparin  injection (subcutaneous)  5,000 Units Subcutaneous Q8H   insulin  aspart  0-15 Units Subcutaneous Q6H   melatonin  6 mg Oral Once   metoprolol  tartrate  5 mg Intravenous Q4H   pantoprazole  (PROTONIX ) IV  40 mg Intravenous Q12H   sodium chloride  flush  10-40 mL Intracatheter Q12H   Continuous Infusions:  amiodarone  30 mg/hr (08/21/23 0655)   piperacillin -tazobactam (ZOSYN )  IV 12.5 mL/hr at 08/21/23 0655   sodium bicarbonate  150 mEq in dextrose  5 % 1,150 mL infusion     TPN ADULT (ION) 80 mL/hr at 08/21/23 0655   TPN ADULT (ION)       LOS: 14 days    Time spent: 55 minutes    Destyni Hoppel Loran Rock, DO [facility]  If 7PM-7AM, please contact night-coverage [email or web address] 08/21/2023, 1:12 PM

## 2023-08-21 NOTE — Progress Notes (Signed)
 PHARMACY - TOTAL PARENTERAL NUTRITION CONSULT NOTE   Indication: Small bowel obstruction  Patient Measurements: Height: 5\' 7"  (170.2 cm) Weight: 101.5 kg (223 lb 12.3 oz) IBW/kg (Calculated) : 61.6 TPN AdjBW (KG): 72.1 Body mass index is 35.05 kg/m. Usual Weight: 90 kg  Assessment:  Small bowel obstruction with ventral hernia and end ileostomy. S/p ileostomy revision 5/15. Acute on CKD with worsening renal function and elevated potassium.   Glucose / Insulin : 240s, 10 units insulin  given  Electrolytes: CO2 18, K 5.9 - K removed from TPN 5/15 Corrected calcium  9.7 Renal: Alb 1.8, Scr 3 Hepatic: 5/14 Alk phos 160, AST/ALT 41/68 - has been normal on labs prior to 5/14 TG 5/12 218  Central access: PICC TPN start date:  5/8  Nutritional Goals: Goal TPN rate is 80 mL/hr (provides 115 g of protein and 1920 kcals per day)  RD Assessment: Estimated Needs Total Energy Estimated Needs: 1900-2100 Total Protein Estimated Needs: 110-130 gm Total Fluid Estimated Needs: 1.9-2.1 L  Current Nutrition:  Off ensure while NPO for NG procedure. 100% nutrition from TPN  Plan:  Continue TPN at 80 mL/hr at 1800 Plan on continuing TPN until patient able to tolerate regular diet For surgery today 5/15 Electrolytes in TPN: Na 43mEq/L, K 0 mEq/L, Ca 45mEq/L, Mg 16mEq/L, and Phos 15 mmol/L. Cl:Ac max Standard MVI and trace elements to TPN Add Insulin  7 units to TPN Change Moderate q6h SSI and adjust as needed  Monitor TPN labs on Mon/Thurs  Ulyses Gandy, PharmD Clinical Pharmacist 08/21/2023 8:05 AM      08/21/2023 7:51 AM

## 2023-08-21 NOTE — Progress Notes (Signed)
 [facility] Progress Note  1 Day Post-Op  Subjective: Patient seen and examined.  She is resting comfortably in bed.  She states that her pain is well-controlled with her medications.  She has had minimal NG tube output, with only small amount of bilious output in the canister.  Her ileostomy has had 800 cc of liquid stool output since the surgery.  She denies nausea and vomiting.  Objective: Vital signs in last 24 hours: Temp:  [98 F (36.7 C)-99 F (37.2 C)] 98.7 F (37.1 C) (05/16 0400) Pulse Rate:  [102-112] 111 (05/16 0600) Resp:  [12-28] 15 (05/16 0600) BP: (91-134)/(49-79) 104/55 (05/16 0600) SpO2:  [91 %-99 %] 97 % (05/16 0600) Weight:  [101.5 kg] 101.5 kg (05/16 0400) Last BM Date : 08/19/23  Intake/Output from previous day: 05/15 0701 - 05/16 0700 In: 3273.4 [I.V.:2875.4; IV Piggyback:398] Out: 1700 [Urine:750; Stool:800] Intake/Output this shift: Total I/O In: -  Out: 200 [Urine:200]  General appearance: alert, cooperative, and no distress Nose: NG tube in place with minimal bilious output in canister GI: Abdomen soft, nondistended, no percussion tenderness, incisional tenderness to palpation tenderness to palpation around ileostomy, no rigidity, guarding, rebound tenderness; midline incision C/D/I with some dried bloody strikethrough noted, right sided ileostomy pink and patent with liquid stool in bag  Lab Results:  Recent Labs    08/20/23 0419 08/21/23 0436  WBC 10.8* 21.1*  HGB 8.7* 8.8*  HCT 27.8* 28.8*  PLT 300 348   BMET Recent Labs    08/21/23 0436 08/21/23 0438  NA 139 139  K 5.9* 5.9*  CL 112* 111  CO2 18* 18*  GLUCOSE 245* 247*  BUN 99* 99*  CREATININE 2.99* 3.01*  CALCIUM  7.9* 7.9*   PT/INR No results for input(s): "LABPROT", "INR" in the last 72 hours.  Studies/Results: DG Chest Port 1 View Result Date: 08/20/2023 CLINICAL DATA:  NG placement. EXAM: PORTABLE CHEST 1 VIEW COMPARISON:  Chest radiograph dated  08/12/2023. FINDINGS: Enteric tube extends below the diaphragm into the stomach and fold back on itself with tip in the region of the proximal stomach. There is shallow inspiration with bibasilar atelectasis. No pneumothorax. Stable cardiac silhouette. Median sternotomy wires and repair of the thoracic aorta. No acute osseous pathology. IMPRESSION: 1. Enteric tube with tip in the proximal stomach. 2. Shallow inspiration with bibasilar atelectasis. Electronically Signed   By: Angus Bark M.D.   On: 08/20/2023 15:36    Anti-infectives: Anti-infectives (From admission, onward)    Start     Dose/Rate Route Frequency Ordered Stop   08/20/23 1800  piperacillin -tazobactam (ZOSYN ) IVPB 3.375 g        3.375 g 12.5 mL/hr over 240 Minutes Intravenous Every 8 hours 08/20/23 1431 08/25/23 2159   08/20/23 0800  cefoTEtan (CEFOTAN) 2 g in sodium chloride  0.9 % 100 mL IVPB        2 g 200 mL/hr over 30 Minutes Intravenous On call to O.R. 08/19/23 0859 08/20/23 0946       Assessment/Plan:  Patient is a 63 year old female who was admitted with a small bowel obstruction secondary to a ventral hernia.  She is status post exploratory laparotomy, lysis of adhesions, small bowel resection, and revision of ileostomy on 5/15.  - Patient with a jump in her leukocytosis from 10.8 to 21.1.  Suspect a component of this is likely reactive from surgery.  She was noted to have a perforation within her ventral hernia at the time of surgery, so would recommend continuing  IV antibiotics - Continue IV Zosyn  - Gram stain from surgery demonstrating moderate gram-negative rods, gram-positive cocci, and rare gram-positive rods.  Await culture and susceptibilities - Patient with return of ileostomy output, with liquid stool in appliance - Given her minimal NG tube output with ileostomy output, will trial clear liquids for the patient.   - Recommend NG tube remain in place clamped, as she had significant dilation at the time of  surgery, and I want to verify she is tolerating a diet before removal of her NG tube - Will need to continue TPN until patient tolerating solid food - Continue to monitor ileostomy output.  Will likely need to start antidiarrheal medications if she starts to have a large volume of output from her ileostomy.  Again with the concern for possible ileus, will hold off on initiating any of these medications at this time - Okay for oral medications from surgical standpoint - Would continue to hold anticoagulation for her A-fib at this time - Maintain Foley per nephrology.  When nephrology okay with Foley removal, the Foley can come out from a surgical standpoint - Changed patient's Tylenol  from as needed to scheduled and added oral oxycodone  as needed for pain - Appreciate hospitalist and consultants recommendations   LOS: 14 days    Lorain Keast A Lonna Rabold 08/21/2023  Note: Portions of this report may have been transcribed using voice recognition software. Every effort has been made to ensure accuracy; however, inadvertent computerized transcription errors may still be present.

## 2023-08-21 NOTE — Progress Notes (Signed)
 PT Cancellation Note  Patient Details Name: Dorothy Harris MRN: [id number] DOB: [birth year]   Cancelled Treatment:    Reason Eval/Treat Not Completed: Pain limiting ability to participate.  Patient declined therapy due to c/o pain - RN aware.   3:41 PM, 08/21/23 Walton Guppy, MPT Physical Therapist with [facility] [phone] office [phone] mobile phone

## 2023-08-21 NOTE — TOC Progression Note (Signed)
 Transition of Care [facility]) - Progression Note    Patient Details  Name: Dorothy Harris MRN: [id number] Date of Birth: 08/23/1960  [facility] CM/SW Contact  [place] Lax, [place] Phone Number: 08/21/2023, 11:47 AM  Clinical Narrative:    CSW met with pt at bedside to review SNF bed offers. Pt states that she would like to stay in [facility]. CSW informed that [facility] is only SNF in area, pt states she would like to accept this bed. CSW reached out to admissions to verify they take insurance and can start auth. TOC to follow.   Expected Discharge Plan: Home/Self Care Barriers to Discharge: Continued Medical Work up  Expected Discharge Plan and Services In-house Referral: Clinical Social Work Discharge Planning Services: CM Consult   Living arrangements for the past 2 months: Single Family Home                                       Social Determinants of Health (SDOH) Interventions SDOH Screenings   Food Insecurity: Food Insecurity Present (08/07/2023)  Housing: High Risk (08/07/2023)  Transportation Needs: No Transportation Needs (08/07/2023)  Utilities: Not At Risk (08/07/2023)  Alcohol Screen: Low Risk  (05/07/2021)  Depression (PHQ2-9): Medium Risk (05/05/2023)  Financial Resource Strain: Low Risk  (05/07/2021)  Physical Activity: Inactive (05/07/2021)  Tobacco Use: High Risk (08/20/2023)    Readmission Risk Interventions    08/11/2023    8:27 AM 08/07/2023    7:29 AM 01/22/2022   12:57 PM  Readmission Risk Prevention Plan  Medication Screening  Complete   Transportation Screening Complete Complete Complete  PCP or Specialist Appt within 3-5 Days   Complete  HRI or Home Care Consult Complete  Complete  Social Work Consult for Recovery Care Planning/Counseling Complete  Complete  Palliative Care Screening Not Applicable  Not Applicable  Medication Review Oceanographer) Complete  Referral to Pharmacy

## 2023-08-21 NOTE — Progress Notes (Signed)
 [facility] Progress Note   Assessment/ Plan:   Pt is a 63 y.o. 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   # Nonoliguric acute kidney injury on CKD stage IV likely due to prerenal insult/reduced renal perfusion: Baseline creatinine level around 2.0. - initially got better, now AKI again- underwent surgery yesterday - will add low-rate bicarb gtt    # SBO with significant GI output/NG tube decompression:  - s/p resection 08/20/23 - NGT in place - on TPN- 0K in TPN already, acetate balance maximized per pharmacy- appreciate their assistance   # Acute urinary retention:  - Foley in place presently   #Hyperkalemia: - getting Ca, insulin /dextrose , bicarb - adding low-rate bicarb gtt as above - would avoid PO K binders  - hesitant to add albuterol  in the setting of Afib and amio gtt     Subjective:    Reconsult: 49F with high-grade SBO and high cardiac risk- Afib as well- underwent surgery yesterday.  On TPN, K 5.9 and Cr up to 3.0 today.  Getting insulin / glucose, bicarb, Ca.  Asked to see for hyperkalemia and recurrent AKI.    TPN notes reviewed.  0K in TPN formulation.     Objective:   BP (!) 104/55   Pulse (!) 111   Temp 98.7 F (37.1 C) (Axillary)   Resp 15   Ht 5\' 7"  (1.702 m)   Wt 101.5 kg   SpO2 97%   BMI 35.05 kg/m   Intake/Output Summary (Last 24 hours) at 08/21/2023 0926 Last data filed at 08/21/2023 1610 Gross per 24 hour  Intake 3173.41 ml  Output 1400 ml  Net 1773.41 ml   Weight change: 0 kg  Physical Exam: RUE:[id number] ill CVS: tachycardic Resp: shallow, clear Abd: mildline incision, + ostomy Ext: 1+ anasarca  Imaging: DG Chest Port 1 View Result Date: 08/20/2023 CLINICAL DATA:  NG placement. EXAM: PORTABLE CHEST 1 VIEW  COMPARISON:  Chest radiograph dated 08/12/2023. FINDINGS: Enteric tube extends below the diaphragm into the stomach and fold back on itself with tip in the region of the proximal stomach. There is shallow inspiration with bibasilar atelectasis. No pneumothorax. Stable cardiac silhouette. Median sternotomy wires and repair of the thoracic aorta. No acute osseous pathology. IMPRESSION: 1. Enteric tube with tip in the proximal stomach. 2. Shallow inspiration with bibasilar atelectasis. Electronically Signed   By: Angus Bark M.D.   On: 08/20/2023 15:36    Labs: BMET Recent Labs  Lab 08/17/23 0548 08/18/23 0535 08/19/23 0411 08/20/23 0419 08/20/23 0420 08/21/23 0436 08/21/23 0438  NA 140 141 140 142 142 139 139  K 4.1 4.3 4.7 4.8 4.8 5.9* 5.9*  CL 106 108 111 112* 112* 112* 111  CO2 23 25 22  20* 20* 18* 18*  GLUCOSE 214* 174* 171* 154* 156* 245* 247*  BUN 71* 77* 79* 77* 77* 99* 99*  CREATININE 2.09* 2.25* 2.28* 2.14* 2.23* 2.99* 3.01*  CALCIUM  8.0* 8.2* 8.1* 8.5* 8.5* 7.9* 7.9*  PHOS 4.2 3.8 3.5 3.3 3.3 4.3 4.3   CBC Recent Labs  Lab 08/20/23 0419 08/21/23 0436  WBC 10.8* 21.1*  NEUTROABS 8.4* 17.4*  HGB 8.7* 8.8*  HCT 27.8* 28.8*  MCV 101.5* 104.3*  PLT 300 348    Medications:     Chlorhexidine  Gluconate Cloth  6  each Topical Daily   heparin  injection (subcutaneous)  5,000 Units Subcutaneous Q8H   insulin  aspart  0-15 Units Subcutaneous Q6H   insulin  aspart  10 Units Intravenous Once   melatonin  6 mg Oral Once   metoprolol  tartrate  5 mg Intravenous Q4H   pantoprazole  (PROTONIX ) IV  40 mg Intravenous Q12H   sodium chloride  flush  10-40 mL Intracatheter Q12H    Leandra Pro, MD 08/21/2023, 9:26 AM

## 2023-08-22 DIAGNOSIS — K56609 Unspecified intestinal obstruction, unspecified as to partial versus complete obstruction: Secondary | ICD-10-CM | POA: Diagnosis not present

## 2023-08-22 LAB — CBC
HCT: 23.3 % — ABNORMAL LOW (ref 36.0–46.0)
Hemoglobin: 7 g/dL — ABNORMAL LOW (ref 12.0–15.0)
MCH: 30.8 pg (ref 26.0–34.0)
MCHC: 30 g/dL (ref 30.0–36.0)
MCV: 102.6 fL — ABNORMAL HIGH (ref 80.0–100.0)
Platelets: 283 10*3/uL (ref 150–400)
RBC: 2.27 MIL/uL — ABNORMAL LOW (ref 3.87–5.11)
RDW: 16.9 % — ABNORMAL HIGH (ref 11.5–15.5)
WBC: 16.8 10*3/uL — ABNORMAL HIGH (ref 4.0–10.5)
nRBC: 0 % (ref 0.0–0.2)

## 2023-08-22 LAB — RENAL FUNCTION PANEL
Albumin: 1.5 g/dL — ABNORMAL LOW (ref 3.5–5.0)
Anion gap: 11 (ref 5–15)
BUN: 100 mg/dL — ABNORMAL HIGH (ref 8–23)
CO2: 19 mmol/L — ABNORMAL LOW (ref 22–32)
Calcium: 7.7 mg/dL — ABNORMAL LOW (ref 8.9–10.3)
Chloride: 105 mmol/L (ref 98–111)
Creatinine, Ser: 3.16 mg/dL — ABNORMAL HIGH (ref 0.44–1.00)
GFR, Estimated: 16 mL/min — ABNORMAL LOW (ref >60)
Glucose, Bld: 209 mg/dL — ABNORMAL HIGH (ref 70–99)
Phosphorus: 5.4 mg/dL — ABNORMAL HIGH (ref 2.5–4.6)
Potassium: 4.1 mmol/L (ref 3.5–5.1)
Sodium: 135 mmol/L (ref 135–145)

## 2023-08-22 LAB — HEMOGLOBIN AND HEMATOCRIT, BLOOD
HCT: 26.8 % — ABNORMAL LOW (ref 36.0–46.0)
Hemoglobin: 8.7 g/dL — ABNORMAL LOW (ref 12.0–15.0)

## 2023-08-22 LAB — GLUCOSE, CAPILLARY
Glucose-Capillary: 159 mg/dL — ABNORMAL HIGH (ref 70–99)
Glucose-Capillary: 171 mg/dL — ABNORMAL HIGH (ref 70–99)
Glucose-Capillary: 198 mg/dL — ABNORMAL HIGH (ref 70–99)
Glucose-Capillary: 199 mg/dL — ABNORMAL HIGH (ref 70–99)

## 2023-08-22 LAB — MAGNESIUM: Magnesium: 2 mg/dL (ref 1.7–2.4)

## 2023-08-22 LAB — PREPARE RBC (CROSSMATCH)

## 2023-08-22 MED ORDER — SODIUM CHLORIDE 0.9% IV SOLUTION: Freq: Once | INTRAVENOUS | Status: AC

## 2023-08-22 MED ORDER — MAGNESIUM FOR TPN
INJECTION | INTRAVENOUS | Status: AC
  Filled 2023-08-22: qty 1152

## 2023-08-22 MED ORDER — SODIUM BICARBONATE 8.4 % IV SOLN
INTRAVENOUS | Status: AC
  Filled 2023-08-22 (×2): qty 1000

## 2023-08-22 NOTE — Progress Notes (Signed)
 [facility] Progress Note  2 Days Post-Op  Subjective: Patient seen and examined.  She is resting comfortably in bed.  She remains tachycardic.  She tolerated some clears this morning without nausea and vomiting.  She did not drink much yesterday.  Her ileostomy had 400 cc of output in the last 24 hours, and there is a small amount of liquid within her appliance.  Her NG tube has been clamped and has had no output.  She complains of some abdominal pain, but is feeling a little bit better.  Objective: Vital signs in last 24 hours: Temp:  [98.2 F (36.8 C)-98.8 F (37.1 C)] 98.4 F (36.9 C) (05/17 1200) Pulse Rate:  [94-118] 116 (05/17 1200) Resp:  [11-25] 15 (05/17 1200) BP: (77-123)/(40-90) 123/58 (05/17 1200) SpO2:  [97 %-100 %] 98 % (05/17 1200) Weight:  [101.8 kg] 101.8 kg (05/17 0500) Last BM Date : 08/19/23  Intake/Output from previous day: 05/16 0701 - 05/17 0700 In: 3366.5 [I.V.:3232.8; IV Piggyback:133.8] Out: 1475 [Urine:1075; Stool:400] Intake/Output this shift: Total I/O In: 765.3 [I.V.:430.8; Blood:303; IV Piggyback:31.5] Out: -   General appearance: alert, cooperative, and no distress Nose: NG tube in place, currently clamped GI: Abdomen soft, nondistended, no percussion tenderness, mild incisional tenderness to palpation; no rigidity, guarding, rebound tenderness; midline incision C/D/I with a small amount of old bloody strikethrough on honeycomb dressing, right-sided ileostomy pink and patent with a small amount of liquid stool in bag  Lab Results:  Recent Labs    08/21/23 0436 08/22/23 0508  WBC 21.1* 16.8*  HGB 8.8* 7.0*  HCT 28.8* 23.3*  PLT 348 283   BMET Recent Labs    08/21/23 0438 08/21/23 1351 08/22/23 0508  NA 139  --  135  K 5.9* 5.1 4.1  CL 111  --  105  CO2 18*  --  19*  GLUCOSE 247*  --  209*  BUN 99*  --  100*  CREATININE 3.01*  --  3.16*  CALCIUM  7.9*  --  7.7*   PT/INR No results for input(s): "LABPROT", "INR"  in the last 72 hours.  Studies/Results: DG Chest Port 1 View Result Date: 08/20/2023 CLINICAL DATA:  NG placement. EXAM: PORTABLE CHEST 1 VIEW COMPARISON:  Chest radiograph dated 08/12/2023. FINDINGS: Enteric tube extends below the diaphragm into the stomach and fold back on itself with tip in the region of the proximal stomach. There is shallow inspiration with bibasilar atelectasis. No pneumothorax. Stable cardiac silhouette. Median sternotomy wires and repair of the thoracic aorta. No acute osseous pathology. IMPRESSION: 1. Enteric tube with tip in the proximal stomach. 2. Shallow inspiration with bibasilar atelectasis. Electronically Signed   By: Angus Bark M.D.   On: 08/20/2023 15:36    Anti-infectives: Anti-infectives (From admission, onward)    Start     Dose/Rate Route Frequency Ordered Stop   08/20/23 1800  piperacillin -tazobactam (ZOSYN ) IVPB 3.375 g        3.375 g 12.5 mL/hr over 240 Minutes Intravenous Every 8 hours 08/20/23 1431 08/25/23 2159   08/20/23 0800  cefoTEtan  (CEFOTAN ) 2 g in sodium chloride  0.9 % 100 mL IVPB        2 g 200 mL/hr over 30 Minutes Intravenous On call to O.R. 08/19/23 0859 08/20/23 0946       Assessment/Plan:  Patient is a 63 year old female who was admitted with a small bowel obstruction secondary to a ventral hernia.  She is status post exploratory laparotomy, lysis of adhesions, small bowel resection, and  revision of ileostomy on 5/15.   - Patient's leukocytosis is downtrending, 16.8 from 21.1 - Continue IV Zosyn  - Gram stain from surgery demonstrating moderate gram-negative rods, gram-positive cocci, and rare gram-positive rods.  Culture with gram-negative rods, await susceptibilities - Patient with ileostomy output, though slightly decreased from the output yesterday - Continue clear liquids today - Recommend NG tube remain in place clamped, as she had significant dilation at the time of surgery, and I want to verify she is truly  tolerating a diet before removal of her NG tube - Will need to continue TPN until patient tolerating solid food - Continue to monitor ileostomy output.  Will likely need to start antidiarrheal medications if she starts to have a large volume of output from her ileostomy.  Again with the concern for possible ileus, will hold off on initiating any of these medications at this time - Okay for oral medications from surgical standpoint - Hemoglobin dropped to 7 from 8.8.  2 units PRBCs ordered.  Suspect this is from equilibration after the surgery.  Continue to monitor daily labs and hemoglobin - Would continue to hold anticoagulation for her A-fib at this time - Maintain Foley per nephrology/hospitalist.  The Foley can come out from a surgical standpoint - Appreciate hospitalist and consultants recommendations   LOS: 15 days    Dorothy Harris 08/22/2023  Note: Portions of this report may have been transcribed using voice recognition software. Every effort has been made to ensure accuracy; however, inadvertent computerized transcription errors may still be present.

## 2023-08-22 NOTE — Progress Notes (Signed)
 PHARMACY - TOTAL PARENTERAL NUTRITION CONSULT NOTE   Indication: Small bowel obstruction  Patient Measurements: Height: 5\' 7"  (170.2 cm) Weight: 101.8 kg (224 lb 6.9 oz) IBW/kg (Calculated) : 61.6 TPN AdjBW (KG): 72.1 Body mass index is 35.15 kg/m. Usual Weight: 90 kg  Assessment:  Small bowel obstruction with ventral hernia and end ileostomy. S/p ileostomy revision 5/15. Acute on CKD with worsening renal function and elevated potassium - now rormalized   Glucose / Insulin : 199-209, 11 additional units insulin  given. No prior hx DM Electrolytes: K 4.1, CO2 19, Corrected Ca 9.7, Phos 5.4 Renal: Alb 1.5, Scr 3.16< 3 Hepatic: 5/14 Alk phos 160, AST/ALT 41/68 - has been normal on labs prior to 5/14 TG 5/12 218 Intake / Output; MIVF: +1891. Bicarb @ 75 ml/hr Central access: PICC TPN start date:  5/8  Nutritional Goals: Goal TPN rate is 80 mL/hr (provides 115 g of protein and 1920 kcals per day)  RD Assessment: Estimated Needs Total Energy Estimated Needs: 1900-2100 Total Protein Estimated Needs: 110-130 gm Total Fluid Estimated Needs: 1.9-2.1 L  Current Nutrition:  Thin clear liquids started 5/16 TPN  Plan:  Continue TPN at 80 mL/hr at 1800 Plan on continuing TPN until patient able to tolerate regular diet Electrolytes in TPN: Na 51mEq/L, K 20 mEq/L, Ca 39mEq/L, Mg 58mEq/L, and Phos 0 mmol/L. Cl:Ac max Standard MVI and trace elements to TPN Increase Insulin  12 units in TPN Continue Moderate q6h SSI and adjust as needed  Monitor TPN labs on Mon/Thurs  Ulyses Gandy, PharmD Clinical Pharmacist 08/22/2023 8:38 AM      08/22/2023 8:38 AM

## 2023-08-22 NOTE — Progress Notes (Incomplete)
 PT has been refusing turns and had refused PT/OT on 08/21/23. I had made clear that the turning was to prevent "bed sore". PT replied that she indeed had been shifting her weight.

## 2023-08-22 NOTE — Progress Notes (Signed)
 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  Patient currently noted to have high-grade SBO with plans for operative repair scheduled 5/15.  AKI has improved and she remains on amiodarone  infusion for atrial fibrillation with RVR.  She has undergone exploratory laparotomy with lysis of adhesions and reduction of ventral hernia with resection of small bowel and end ileostomy on 5/15.  No immediate complications noted.  Now noted to have some worsening anemia requiring 2 unit PRBC transfusion.  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   Atrial fibrillation with rapid ventricular response (HCC)   Perforated bowel (HCC)   Mesenteric abscess (HCC)  Assessment and Plan:  High-Grade SBO status post exploratory lap with lysis of adhesions and resection of small bowel with end ileostomy revision 5/15 - Appreciate ongoing general surgery evaluation - Continue TPN -Continue IV antibiotics -Continue pain management -Antiemetics -NG tube to remain in place for now -Started on clear liquid diet 5/16   AKI  with metabolic acidosis---acute kidney injury on CKD stage IV -due to dehydration/volume depletion in a patient with SBO and n.p.o. status with high output from NG tube -creatinine on admission= 4.59,  baseline creatinine = 2.0 to 2.2  ,  -creatinine peaked at 8.07 (on 08/09/23) - Continues to remain stable - Nephrology re-consultation appreciated - Appreciate recommendations for bicarbonate drip to continue - Potassium has corrected   Acute anemia likely postoperative equilibration - 2 unit PRBC ordered - Continue to monitor CBC - No overt bleeding noted   Essential hypertension: BP on the soft sides and will require pressors.  - c/n to Hold PTA amlodipine  and oral hydralazine   - Continue to follow VS   Paroxysmal A-fib: -PTA amiodarone  and Coreg  on hold due to n.p.o. status -Patient was not on anticoagulation PTA - Echo 60-65% - Continue IV amiodarone  drip; intermittent boluses will be provided to assist with rate control - Plan to convert to oral amiodarone  once tolerating diet reliably and consistently   HFpEF with LVEF 60-65% Currently volume depleted needing IV fluids due to SBO.   - Started on sodium bicarbonate  infusion per nephrology -continue to follow I's and O's   HLD - Continue to hold PTA atorvastatin  while n.p.o.  acute urinary retention -in and out cath X 3 without success of voiding spontaneously; foley catheter placed and good urine output appreciated. - Creatinine has further improved and back to her baseline at the moment. - No suprapubic tenderness reported.  obesity, class II - BMI 35.81    DVT prophylaxis: Heparin  Code Status: Full Family Communication: Aunt at bedside 5/15 Disposition Plan:  Status is: Inpatient Remains inpatient appropriate because: Need for IV medications and fluids  Nutrition Status: Nutrition Problem: Inadequate oral intake Etiology: altered GI function Signs/Symptoms: NPO status Interventions: TPN  Consultants:   General surgery Cardiology Nephrology  Procedures:  Ex lap with LOA and reduction of ventral hernia with resection of small bowel and end ileostomy revision 5/15  Antimicrobials:  Anti-infectives (From admission, onward)    Start     Dose/Rate Route Frequency Ordered Stop   08/20/23 1800  piperacillin -tazobactam (ZOSYN ) IVPB 3.375 g        3.375 g 12.5 mL/hr over 240 Minutes Intravenous Every 8 hours 08/20/23 1431 08/25/23 2159   08/20/23 0800  cefoTEtan  (CEFOTAN ) 2 g in sodium chloride  0.9 % 100 mL IVPB        2 g 200 mL/hr over 30 Minutes Intravenous On call to O.R. 08/19/23 1610 08/20/23 0946      Subjective: Patient seen and evaluated today with no significant abdominal pain this morning.  Worsening anemia noted on lab work this morning.  No acute overnight events documented.  Still continues to have ongoing tachycardia on amiodarone  infusion.  Objective: Vitals:   08/22/23 0904 08/22/23 0906 08/22/23 0921 08/22/23 1000  BP:   (!) 106/54 (!) 106/57  Pulse: (!) 116 (!) 114 94 (!) 113  Resp: 16 16 12 19   Temp: 98.8 F (37.1 C)  98.8 F (37.1 C)   TempSrc: Oral  Oral   SpO2: 98% 99% 97% 98%  Weight:      Height:        Intake/Output Summary (Last 24 hours) at 08/22/2023 1042 Last data filed at 08/22/2023 0900 Gross per 24 hour  Intake 3828.88 ml  Output 1275 ml  Net 2553.88 ml   Filed Weights   08/20/23 0745 08/21/23 0400 08/22/23 0500  Weight: 103.7 kg 101.5 kg 101.8 kg    Examination:  General exam: Appears calm and comfortable  Respiratory system: Clear to auscultation. Respiratory effort normal. Cardiovascular system: S1 & S2 heard, irregular and tachycardic. Gastrointestinal system: Abdominal binder present and colostomy bag with bloody drainage, NG tube to suction Central nervous system: Alert and awake Extremities: No edema Skin: No significant lesions noted Psychiatry: Flat affect.    Data Reviewed: I have personally reviewed following labs and  imaging studies  CBC: Recent Labs  Lab 08/20/23 0419 08/21/23 0436 08/22/23 0508  WBC 10.8* 21.1* 16.8*  NEUTROABS 8.4* 17.4*  --   HGB 8.7* 8.8* 7.0*  HCT 27.8* 28.8* 23.3*  MCV 101.5* 104.3* 102.6*  PLT 300 348 283   Basic Metabolic Panel: Recent Labs  Lab 08/17/23 0548 08/18/23 0535 08/20/23 0419 08/20/23 0420 08/21/23 0436 08/21/23 0438 08/21/23 1351 08/22/23 0508  NA 140   < > 142 142 139 139  --  135  K 4.1   < > 4.8 4.8 5.9* 5.9* 5.1 4.1  CL 106   < > 112* 112* 112* 111  --  105  CO2 23   < > 20* 20* 18* 18*  --  19*  GLUCOSE 214*   < > 154* 156* 245* 247*  --  209*  BUN 71*   < > 77* 77* 99* 99*  --  100*  CREATININE 2.09*   < > 2.14* 2.23* 2.99* 3.01*  --  3.16*  CALCIUM  8.0*   < > 8.5* 8.5* 7.9* 7.9*  --  7.7*  MG 1.9  --  2.3  --  2.1  --   --  2.0  PHOS 4.2   < > 3.3 3.3 4.3 4.3  --  5.4*   < > = values in this interval not displayed.   GFR: Estimated Creatinine Clearance: 22.6 mL/min (A) (by C-G formula based on SCr of 3.16 mg/dL (H)). Liver Function Tests: Recent Labs  Lab 08/17/23 0548 08/18/23 0535 08/19/23 0411 08/20/23 0419 08/20/23 0420 08/21/23 0438 08/22/23 0508  AST 17  --   --  41  --   --   --   ALT 12  --   --  68*  --   --   --   ALKPHOS 86  --   --  160*  --   --   --   BILITOT 0.3  --   --  0.4  --   --   --   PROT 5.1*  --   --  5.4*  --   --   --   ALBUMIN  1.8*   < > 1.7* 1.9* 1.9* 1.8* 1.5*   < > = values in this interval not displayed.   No results for input(s): "LIPASE", "AMYLASE" in the last 168 hours. No results for input(s): "AMMONIA" in the last 168 hours. Coagulation Profile: No results for input(s): "INR", "PROTIME" in the last 168 hours. Cardiac Enzymes: No results for input(s): "CKTOTAL", "CKMB", "CKMBINDEX", "TROPONINI" in the last 168 hours. BNP (last 3 results) No results for input(s): "PROBNP" in the last 8760 hours. HbA1C: No results for input(s): "HGBA1C" in the last 72 hours. CBG: Recent Labs  Lab  08/21/23 0816 08/21/23 1149 08/21/23 1803 08/22/23 0031 08/22/23 0617  GLUCAP 213* 190* 201* 198* 199*   Lipid Profile: No results for input(s): "CHOL", "HDL", "LDLCALC", "TRIG", "CHOLHDL", "LDLDIRECT" in the last 72 hours.  Thyroid Function Tests: No results for input(s): "TSH", "T4TOTAL", "FREET4", "T3FREE", "THYROIDAB" in the last 72 hours. Anemia Panel: No results for input(s): "VITAMINB12", "FOLATE", "FERRITIN", "TIBC", "IRON", "RETICCTPCT" in the last 72 hours. Sepsis Labs: No results for input(s): "PROCALCITON", "LATICACIDVEN" in the last 168 hours.  Recent Results (from the past 240 hours)  Aerobic/Anaerobic Culture w Gram Stain (surgical/deep wound)     Status: None (Preliminary result)   Collection Time: 08/20/23 10:11 AM   Specimen: Abdomen; Abscess  Result Value Ref Range Status   Specimen Description   Final    WOUND Performed at [facility], [address] [place], [place] [address]    Special Requests   Final    NONE Performed at [facility], [address] [place], [place] [address]    Gram Stain   Final    FEW WBC PRESENT, PREDOMINANTLY PMN MODERATE GRAM NEGATIVE RODS FEW GRAM POSITIVE COCCI IN PAIRS RARE GRAM POSITIVE RODS    Culture   Final    FEW GRAM NEGATIVE RODS CULTURE REINCUBATED FOR BETTER GROWTH Performed at [facility], [address], [place], [place] [address]    Report Status PENDING  Incomplete         Radiology Studies: DG Chest Port 1 View Result Date: 08/20/2023 CLINICAL DATA:  NG placement. EXAM: PORTABLE CHEST 1 VIEW COMPARISON:  Chest radiograph dated 08/12/2023. FINDINGS: Enteric tube extends below the diaphragm into the stomach and fold back on itself with tip in the region of the proximal stomach. There is shallow inspiration with bibasilar atelectasis. No pneumothorax. Stable cardiac silhouette. Median sternotomy wires and repair of the thoracic aorta. No acute osseous pathology. IMPRESSION: 1. Enteric tube with tip  in the  proximal stomach. 2. Shallow inspiration with bibasilar atelectasis. Electronically Signed   By: Angus Bark M.D.   On: 08/20/2023 15:36         Scheduled Meds:  sodium chloride    Intravenous Once   acetaminophen   1,000 mg Oral Q6H   Chlorhexidine  Gluconate Cloth  6 each Topical Daily   heparin  injection (subcutaneous)  5,000 Units Subcutaneous Q8H   insulin  aspart  0-15 Units Subcutaneous Q6H   melatonin  6 mg Oral Once   metoprolol  tartrate  5 mg Intravenous Q4H   pantoprazole  (PROTONIX ) IV  40 mg Intravenous Q12H   sodium chloride  flush  10-40 mL Intracatheter Q12H   Continuous Infusions:  amiodarone  Stopped (08/22/23 0853)   piperacillin -tazobactam (ZOSYN )  IV 12.5 mL/hr at 08/22/23 0900   sodium bicarbonate  150 mEq in dextrose  5 % 1,150 mL infusion     TPN ADULT (ION) 80 mL/hr at 08/22/23 0900   TPN ADULT (ION)       LOS: 15 days    Time spent: 55 minutes    Katerine Morua Loran Rock, DO [facility]  If 7PM-7AM, please contact night-coverage [email or web address] 08/22/2023, 10:42 AM

## 2023-08-23 DIAGNOSIS — K56609 Unspecified intestinal obstruction, unspecified as to partial versus complete obstruction: Secondary | ICD-10-CM | POA: Diagnosis not present

## 2023-08-23 LAB — BPAM RBC
Blood Product Expiration Date: 202505282359
Blood Product Expiration Date: 202505282359
ISSUE DATE / TIME: 202505170844
ISSUE DATE / TIME: 202505171409
Unit Type and Rh: 6200
Unit Type and Rh: 6200

## 2023-08-23 LAB — TYPE AND SCREEN
ABO/RH(D): A POS
Antibody Screen: NEGATIVE
Unit division: 0
Unit division: 0

## 2023-08-23 LAB — RENAL FUNCTION PANEL
Albumin: 1.5 g/dL — ABNORMAL LOW (ref 3.5–5.0)
Anion gap: 9 (ref 5–15)
BUN: 95 mg/dL — ABNORMAL HIGH (ref 8–23)
CO2: 24 mmol/L (ref 22–32)
Calcium: 7.9 mg/dL — ABNORMAL LOW (ref 8.9–10.3)
Chloride: 104 mmol/L (ref 98–111)
Creatinine, Ser: 3.14 mg/dL — ABNORMAL HIGH (ref 0.44–1.00)
GFR, Estimated: 16 mL/min — ABNORMAL LOW (ref >60)
Glucose, Bld: 136 mg/dL — ABNORMAL HIGH (ref 70–99)
Phosphorus: 4.9 mg/dL — ABNORMAL HIGH (ref 2.5–4.6)
Potassium: 3.7 mmol/L (ref 3.5–5.1)
Sodium: 137 mmol/L (ref 135–145)

## 2023-08-23 LAB — GLUCOSE, CAPILLARY
Glucose-Capillary: 136 mg/dL — ABNORMAL HIGH (ref 70–99)
Glucose-Capillary: 139 mg/dL — ABNORMAL HIGH (ref 70–99)
Glucose-Capillary: 145 mg/dL — ABNORMAL HIGH (ref 70–99)
Glucose-Capillary: 148 mg/dL — ABNORMAL HIGH (ref 70–99)
Glucose-Capillary: 150 mg/dL — ABNORMAL HIGH (ref 70–99)

## 2023-08-23 LAB — CBC
HCT: 26.3 % — ABNORMAL LOW (ref 36.0–46.0)
Hemoglobin: 8.4 g/dL — ABNORMAL LOW (ref 12.0–15.0)
MCH: 30.5 pg (ref 26.0–34.0)
MCHC: 31.9 g/dL (ref 30.0–36.0)
MCV: 95.6 fL (ref 80.0–100.0)
Platelets: 297 10*3/uL (ref 150–400)
RBC: 2.75 MIL/uL — ABNORMAL LOW (ref 3.87–5.11)
RDW: 18 % — ABNORMAL HIGH (ref 11.5–15.5)
WBC: 15 10*3/uL — ABNORMAL HIGH (ref 4.0–10.5)
nRBC: 0.1 % (ref 0.0–0.2)

## 2023-08-23 LAB — MAGNESIUM: Magnesium: 2.1 mg/dL (ref 1.7–2.4)

## 2023-08-23 MED ORDER — LACTATED RINGERS IV SOLN: INTRAVENOUS | Status: DC

## 2023-08-23 MED ORDER — TRAVASOL 10 % IV SOLN
INTRAVENOUS | Status: AC
  Filled 2023-08-23: qty 1152

## 2023-08-23 NOTE — Progress Notes (Signed)
 [facility] Progress Note  3 Days Post-Op  Subjective: Patient seen and examined.  She is resting comfortably in bed.  She denies abdominal pain.  She tolerated some of her clear liquids without nausea and vomiting, though she has not consumed a large amount.  Her ileostomy has 100 cc of liquid output and gas currently in the bag and only 65 was documented in the last 24 hours.  Her NG tube has remained clamped and has no significant output.  Objective: Vital signs in last 24 hours: Temp:  [97.9 F (36.6 C)-99.1 F (37.3 C)] 97.9 F (36.6 C) (05/18 1128) Pulse Rate:  [39-118] 85 (05/18 0601) Resp:  [9-20] 12 (05/18 0601) BP: (93-130)/(40-68) 93/40 (05/18 0601) SpO2:  [97 %-99 %] 99 % (05/18 0601) Weight:  [106.9 kg] 106.9 kg (05/18 0500) Last BM Date : 08/22/23  Intake/Output from previous day: 05/17 0701 - 05/18 0700 In: 1997.5 [P.O.:115; I.V.:1210; Blood:576; IV Piggyback:96.6] Out: 1490 [Urine:1425; Stool:65] Intake/Output this shift: Total I/O In: -  Out: 560 [Urine:450; Stool:110]  General appearance: alert, cooperative, and no distress Nose: NG tube in place, currently clamped GI: Abdomen soft, nondistended, no percussion tenderness, nontender to palpation; no rigidity, guarding, rebound tenderness; midline incision C/D/I with a small amount of old bloody strikethrough on the honeycomb dressing, right-sided ileostomy pink and patent with liquid stool and gas in bag  Lab Results:  Recent Labs    08/22/23 0508 08/22/23 1811 08/23/23 0549  WBC 16.8*  --  15.0*  HGB 7.0* 8.7* 8.4*  HCT 23.3* 26.8* 26.3*  PLT 283  --  297   BMET Recent Labs    08/22/23 0508 08/23/23 0549  NA 135 137  K 4.1 3.7  CL 105 104  CO2 19* 24  GLUCOSE 209* 136*  BUN 100* 95*  CREATININE 3.16* 3.14*  CALCIUM  7.7* 7.9*   PT/INR No results for input(s): "LABPROT", "INR" in the last 72 hours.  Studies/Results: No results found.  Anti-infectives: Anti-infectives  (From admission, onward)    Start     Dose/Rate Route Frequency Ordered Stop   08/20/23 1800  piperacillin -tazobactam (ZOSYN ) IVPB 3.375 g        3.375 g 12.5 mL/hr over 240 Minutes Intravenous Every 8 hours 08/20/23 1431 08/25/23 2159   08/20/23 0800  cefoTEtan  (CEFOTAN ) 2 g in sodium chloride  0.9 % 100 mL IVPB        2 g 200 mL/hr over 30 Minutes Intravenous On call to O.R. 08/19/23 0859 08/20/23 0946       Assessment/Plan:  Patient is a 63 year old female who was admitted with a small bowel obstruction secondary to a ventral hernia.  She is status post exploratory laparotomy, lysis of adhesions, small bowel resection, and revision of ileostomy on 5/15.   - Patient's leukocytosis continues to downtrend, 15 from 16.8 - Continue IV Zosyn , recommend 5 days postoperatively of antibiotics - Gram stain from surgery demonstrating moderate gram-negative rods, gram-positive cocci, and rare gram-positive rods.  Culture with Proteus mirabilis, await susceptibilities - Patient with ileostomy output, but output continues to decrease.  She is having gas output - Continue clear liquids today - Will maintain NG tube clamped, as she has not had a significant amount of clear liquids in the last 24 hours - Will consider NG tube removal if her ileostomy output picks up today - Will need to continue TPN until patient tolerating solid food - Continue to monitor ileostomy output.  Will likely need to start antidiarrheal medications  if she starts to have a large volume of output from her ileostomy - Okay for oral medications from surgical standpoint - Hemoglobin stable today at 8.4, continue to monitor with daily blood work - Would continue to hold anticoagulation for her A-fib at this time - Maintain Foley per nephrology/hospitalist.  The Foley can come out from a surgical standpoint - Appreciate hospitalist and consultants recommendations   LOS: 16 days    Jefferey Lippmann A Fernande Treiber 08/23/2023  Note:  Portions of this report may have been transcribed using voice recognition software. Every effort has been made to ensure accuracy; however, inadvertent computerized transcription errors may still be present.

## 2023-08-23 NOTE — Progress Notes (Signed)
 PHARMACY - TOTAL PARENTERAL NUTRITION CONSULT NOTE   Indication: Small bowel obstruction  Patient Measurements: Height: 5\' 7"  (170.2 cm) Weight: 106.9 kg (235 lb 10.8 oz) IBW/kg (Calculated) : 61.6 TPN AdjBW (KG): 72.1 Body mass index is 36.91 kg/m. Usual Weight: 90 kg  Assessment:  Small bowel obstruction with ventral hernia and end ileostomy. S/p ileostomy revision 5/15. Acute on CKD with worsening renal function and elevated potassium - now rormalized   Glucose / Insulin : 159-171, 10 additional units insulin  given. No prior hx DM Electrolytes: K 3.7, Corrected Ca 9.9, Phos normalized Renal: Alb 1.5, Scr 3.14 Hepatic: 5/14 Alk phos 160, AST/ALT 41/68 - has been normal on labs prior to 5/14 TG 5/12 218 Intake / Output; MIVF: +507. S/p Bicarb @ 75 ml/hr Central access: PICC TPN start date:  5/8  Nutritional Goals: Goal TPN rate is 80 mL/hr (provides 115 g of protein and 1920 kcals per day)  RD Assessment: Estimated Needs Total Energy Estimated Needs: 1900-2100 Total Protein Estimated Needs: 110-130 gm Total Fluid Estimated Needs: 1.9-2.1 L  Current Nutrition:  Thin clear liquids started 5/16 TPN  Plan:  Continue TPN at 80 mL/hr at 1800 Electrolytes in TPN: Na 30mEq/L, K 30 mEq/L, Ca 86mEq/L, Mg 76mEq/L, and Phos 0 mmol/L. Cl:Ac max Standard MVI and trace elements to TPN Insulin  12 units in TPN Continue Moderate q6h SSI and adjust as needed  Monitor TPN labs on Mon/Thurs  Ulyses Gandy, PharmD Clinical Pharmacist 08/23/2023 8:40 AM      08/23/2023 8:40 AM

## 2023-08-23 NOTE — Progress Notes (Signed)
 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  Patient currently noted to have high-grade SBO with plans for operative repair scheduled 5/15.  AKI has improved and she remains on amiodarone  infusion for atrial fibrillation with RVR.  She has undergone exploratory laparotomy with lysis of adhesions and reduction of ventral hernia with resection of small bowel and end ileostomy on 5/15.  No immediate complications noted.  Status post 2 unit PRBC infusion 5/17, but still with no significant ostomy output.  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   Atrial fibrillation with rapid ventricular response (HCC)   Perforated bowel (HCC)   Mesenteric abscess (HCC)  Assessment and Plan:  High-Grade SBO status post exploratory lap with lysis of adhesions and resection of small bowel with end ileostomy revision 5/15 - Appreciate ongoing general surgery evaluation - Continue TPN -Continue IV antibiotics -Continue pain management -Antiemetics -NG tube to remain in place for now, especially given low ostomy  output -Started on clear liquid diet 5/16   AKI with metabolic acidosis---acute kidney injury on CKD stage IV -due to dehydration/volume depletion in a patient with SBO and n.p.o. status with high output from NG tube -creatinine on admission= 4.59,  baseline creatinine = 2.0 to 2.2  ,  -creatinine peaked at 8.07 (on 08/09/23) - Continues to remain stable - Nephrology re-consultation appreciated - Appreciate recommendations for LR at this time - Potassium has corrected -Continues to remain nonoliguric   Acute anemia likely postoperative equilibration - 2 unit PRBC ordered - Continue to monitor CBC - No overt bleeding noted   Essential hypertension: BP on the soft sides and will require pressors.  - c/n to Hold PTA amlodipine  and oral hydralazine   - Continue to follow VS   Paroxysmal A-fib: -PTA amiodarone  and Coreg  on hold due to n.p.o. status -Patient was not on anticoagulation PTA - Echo 60-65% - Continue IV amiodarone  drip; intermittent boluses will be provided to assist with rate control - Plan to convert to oral amiodarone  once tolerating diet reliably and consistently   HFpEF with LVEF 60-65% Currently volume depleted needing IV fluids due to SBO.   - Started on sodium bicarbonate  infusion per nephrology -continue to follow I's and O's   HLD - Continue to hold PTA atorvastatin  while n.p.o.  acute urinary retention -in and out cath X 3 without success of voiding spontaneously; foley catheter placed and good urine output appreciated. - Creatinine has further improved and back to her baseline at the moment. - No suprapubic tenderness reported.  obesity, class II - BMI 35.81    DVT prophylaxis: Heparin   Code Status: Full Family Communication: Aunt at bedside 5/15 Disposition Plan:  Status is: Inpatient Remains inpatient appropriate because: Need for IV medications and fluids  Nutrition Status: Nutrition Problem: Inadequate oral intake Etiology: altered GI  function Signs/Symptoms: NPO status Interventions: TPN  Consultants:  General surgery Cardiology Nephrology  Procedures:  Ex lap with LOA and reduction of ventral hernia with resection of small bowel and end ileostomy revision 5/15  Antimicrobials:  Anti-infectives (From admission, onward)    Start     Dose/Rate Route Frequency Ordered Stop   08/20/23 1800  piperacillin -tazobactam (ZOSYN ) IVPB 3.375 g        3.375 g 12.5 mL/hr over 240 Minutes Intravenous Every 8 hours 08/20/23 1431 08/25/23 2159   08/20/23 0800  cefoTEtan  (CEFOTAN ) 2 g in sodium chloride  0.9 % 100 mL IVPB        2 g 200 mL/hr over 30 Minutes Intravenous On call to O.R. 08/19/23 2440 08/20/23 0946      Subjective: Patient seen and evaluated today with no significant abdominal pain this morning.  Continues to have ongoing tachycardia with no acute overnight events noted.  Objective: Vitals:   08/23/23 0600 08/23/23 0601 08/23/23 0810 08/23/23 1128  BP: (!) 93/40 (!) 93/40    Pulse: 80 85    Resp: 11 12    Temp:   97.9 F (36.6 C) 97.9 F (36.6 C)  TempSrc:   Oral Oral  SpO2: 99% 99%    Weight:      Height:        Intake/Output Summary (Last 24 hours) at 08/23/2023 1133 Last data filed at 08/23/2023 1105 Gross per 24 hour  Intake 1535.19 ml  Output 2050 ml  Net -514.81 ml   Filed Weights   08/21/23 0400 08/22/23 0500 08/23/23 0500  Weight: 101.5 kg 101.8 kg 106.9 kg    Examination:  General exam: Appears calm and comfortable  Respiratory system: Clear to auscultation. Respiratory effort normal. Cardiovascular system: S1 & S2 heard, irregular and tachycardic. Gastrointestinal system: Abdominal binder present and colostomy bag with bloody drainage, NG tube to suction Central nervous system: Alert and awake Extremities: No edema Skin: No significant lesions noted Psychiatry: Flat affect.    Data Reviewed: I have personally reviewed following labs and imaging studies  CBC: Recent Labs   Lab 08/20/23 0419 08/21/23 0436 08/22/23 0508 08/22/23 1811 08/23/23 0549  WBC 10.8* 21.1* 16.8*  --  15.0*  NEUTROABS 8.4* 17.4*  --   --   --   HGB 8.7* 8.8* 7.0* 8.7* 8.4*  HCT 27.8* 28.8* 23.3* 26.8* 26.3*  MCV 101.5* 104.3* 102.6*  --  95.6  PLT 300 348 283  --  297   Basic Metabolic Panel: Recent Labs  Lab 08/17/23 0548 08/18/23 0535 08/20/23 0419 08/20/23 0420 08/21/23 0436 08/21/23 0438 08/21/23 1351 08/22/23 0508 08/23/23 0549  NA 140   < > 142 142 139 139  --  135 137  K 4.1   < > 4.8 4.8 5.9* 5.9* 5.1 4.1 3.7  CL 106   < > 112* 112* 112* 111  --  105 104  CO2 23   < > 20* 20* 18* 18*  --  19* 24  GLUCOSE 214*   < > 154* 156* 245* 247*  --  209* 136*  BUN 71*   < > 77* 77* 99* 99*  --  100* 95*  CREATININE 2.09*   < > 2.14* 2.23* 2.99* 3.01*  --  3.16* 3.14*  CALCIUM  8.0*   < > 8.5* 8.5* 7.9* 7.9*  --  7.7* 7.9*  MG 1.9  --  2.3  --  2.1  --   --  2.0 2.1  PHOS 4.2   < > 3.3 3.3 4.3 4.3  --  5.4* 4.9*   < > = values in this interval not displayed.   GFR: Estimated Creatinine Clearance: 23.4 mL/min (A) (by C-G formula based on SCr of 3.14 mg/dL (H)). Liver Function Tests: Recent Labs  Lab 08/17/23 0548 08/18/23 0535 08/20/23 0419 08/20/23 0420 08/21/23 0438 08/22/23 0508 08/23/23 0549  AST 17  --  41  --   --   --   --   ALT 12  --  68*  --   --   --   --   ALKPHOS 86  --  160*  --   --   --   --   BILITOT 0.3  --  0.4  --   --   --   --   PROT 5.1*  --  5.4*  --   --   --   --   ALBUMIN  1.8*   < > 1.9* 1.9* 1.8* 1.5* 1.5*   < > = values in this interval not displayed.   No results for input(s): "LIPASE", "AMYLASE" in the last 168 hours. No results for input(s): "AMMONIA" in the last 168 hours. Coagulation Profile: No results for input(s): "INR", "PROTIME" in the last 168 hours. Cardiac Enzymes: No results for input(s): "CKTOTAL", "CKMB", "CKMBINDEX", "TROPONINI" in the last 168 hours. BNP (last 3 results) No results for input(s): "PROBNP" in  the last 8760 hours. HbA1C: No results for input(s): "HGBA1C" in the last 72 hours. CBG: Recent Labs  Lab 08/22/23 1125 08/22/23 1852 08/23/23 0012 08/23/23 0550 08/23/23 1115  GLUCAP 171* 159* 139* 136* 150*   Lipid Profile: No results for input(s): "CHOL", "HDL", "LDLCALC", "TRIG", "CHOLHDL", "LDLDIRECT" in the last 72 hours.  Thyroid Function Tests: No results for input(s): "TSH", "T4TOTAL", "FREET4", "T3FREE", "THYROIDAB" in the last 72 hours. Anemia Panel: No results for input(s): "VITAMINB12", "FOLATE", "FERRITIN", "TIBC", "IRON", "RETICCTPCT" in the last 72 hours. Sepsis Labs: No results for input(s): "PROCALCITON", "LATICACIDVEN" in the last 168 hours.  Recent Results (from the past 240 hours)  Aerobic/Anaerobic Culture w Gram Stain (surgical/deep wound)     Status: None (Preliminary result)   Collection Time: 08/20/23 10:11 AM   Specimen: Abdomen; Abscess  Result Value Ref Range Status   Specimen Description   Final    WOUND Performed at [facility], [address], [place], [place] [address]    Special Requests   Final    NONE Performed at [facility], [address], [place], [place] [address]    Gram Stain   Final    FEW WBC PRESENT, PREDOMINANTLY PMN MODERATE GRAM NEGATIVE RODS FEW GRAM POSITIVE COCCI IN PAIRS RARE GRAM POSITIVE RODS Performed at [facility], [address] [place], [place] [address]    Culture   Final    FEW PROTEUS MIRABILIS CULTURE REINCUBATED FOR BETTER GROWTH NO ANAEROBES ISOLATED; CULTURE IN PROGRESS FOR 5 DAYS    Report Status PENDING  Incomplete         Radiology Studies: No results found.    Scheduled Meds:  acetaminophen   1,000 mg Oral Q6H   Chlorhexidine  Gluconate Cloth  6 each Topical Daily   heparin  injection (subcutaneous)  5,000 Units Subcutaneous Q8H   insulin  aspart  0-15 Units Subcutaneous Q6H   melatonin  6 mg Oral Once   metoprolol  tartrate  5 mg Intravenous Q4H   pantoprazole  (PROTONIX ) IV   40 mg Intravenous Q12H   sodium chloride  flush  10-40 mL Intracatheter Q12H   Continuous Infusions:  amiodarone  30 mg/hr (08/22/23 2224)   lactated ringers      piperacillin -tazobactam (ZOSYN )  IV 3.375 g (08/23/23 0534)   TPN ADULT (ION) 80 mL/hr at 08/22/23 1806   TPN ADULT (ION)       LOS: 16 days    Time spent: 55 minutes    Jedidiah Demartini Loran Rock, DO [facility]  If 7PM-7AM, please contact night-coverage [email or web address] 08/23/2023, 11:33 AM

## 2023-08-23 NOTE — Progress Notes (Addendum)
 NA and I attempted to encourage pt to get up and sit on the chair to promote healing. Pt stated she would try in the afternoon.  Pt approached about getting up to the chair in the afternoon. Pt is crying and stating she is in severe pain of her arms. The pain and lack of rest is what she claims is keeping her from moving.

## 2023-08-24 DIAGNOSIS — K56609 Unspecified intestinal obstruction, unspecified as to partial versus complete obstruction: Secondary | ICD-10-CM | POA: Diagnosis not present

## 2023-08-24 LAB — MAGNESIUM: Magnesium: 2 mg/dL (ref 1.7–2.4)

## 2023-08-24 LAB — SURGICAL PATHOLOGY

## 2023-08-24 LAB — CBC
HCT: 23.9 % — ABNORMAL LOW (ref 36.0–46.0)
Hemoglobin: 8 g/dL — ABNORMAL LOW (ref 12.0–15.0)
MCH: 32.1 pg (ref 26.0–34.0)
MCHC: 33.5 g/dL (ref 30.0–36.0)
MCV: 96 fL (ref 80.0–100.0)
Platelets: 298 10*3/uL (ref 150–400)
RBC: 2.49 MIL/uL — ABNORMAL LOW (ref 3.87–5.11)
RDW: 17.6 % — ABNORMAL HIGH (ref 11.5–15.5)
WBC: 12.3 10*3/uL — ABNORMAL HIGH (ref 4.0–10.5)
nRBC: 0.2 % (ref 0.0–0.2)

## 2023-08-24 LAB — GLUCOSE, CAPILLARY
Glucose-Capillary: 150 mg/dL — ABNORMAL HIGH (ref 70–99)
Glucose-Capillary: 154 mg/dL — ABNORMAL HIGH (ref 70–99)
Glucose-Capillary: 166 mg/dL — ABNORMAL HIGH (ref 70–99)
Glucose-Capillary: 167 mg/dL — ABNORMAL HIGH (ref 70–99)

## 2023-08-24 LAB — COMPREHENSIVE METABOLIC PANEL WITH GFR
ALT: 16 U/L (ref 0–44)
AST: 10 U/L — ABNORMAL LOW (ref 15–41)
Albumin: <1.5 g/dL — ABNORMAL LOW (ref 3.5–5.0)
Alkaline Phosphatase: 91 U/L (ref 38–126)
Anion gap: 7 (ref 5–15)
BUN: 84 mg/dL — ABNORMAL HIGH (ref 8–23)
CO2: 24 mmol/L (ref 22–32)
Calcium: 7.8 mg/dL — ABNORMAL LOW (ref 8.9–10.3)
Chloride: 105 mmol/L (ref 98–111)
Creatinine, Ser: 2.68 mg/dL — ABNORMAL HIGH (ref 0.44–1.00)
GFR, Estimated: 20 mL/min — ABNORMAL LOW (ref >60)
Glucose, Bld: 145 mg/dL — ABNORMAL HIGH (ref 70–99)
Potassium: 3.9 mmol/L (ref 3.5–5.1)
Sodium: 136 mmol/L (ref 135–145)
Total Bilirubin: 0.4 mg/dL (ref 0.0–1.2)
Total Protein: 5 g/dL — ABNORMAL LOW (ref 6.5–8.1)

## 2023-08-24 LAB — TRIGLYCERIDES: Triglycerides: 127 mg/dL (ref <150)

## 2023-08-24 LAB — PHOSPHORUS: Phosphorus: 3.2 mg/dL (ref 2.5–4.6)

## 2023-08-24 MED ORDER — TRAVASOL 10 % IV SOLN
INTRAVENOUS | Status: AC
  Filled 2023-08-24: qty 1152

## 2023-08-24 NOTE — Progress Notes (Signed)
 [facility]  Ostomy output up, no nausea. Ng removed.  BP 116/66 (BP Location: Left Arm)   Pulse (!) 101   Temp 98.8 F (37.1 C) (Oral)   Resp 16   Ht 5\' 7"  (1.702 m)   Wt 105.7 kg   SpO2 97%   BMI 36.50 kg/m  Soft, ostomy in place Midline with some staining on honeycomb dry Left arm with infiltrated PIV    Patient s/p Ex lap, LOA, end ileostomy revision, SBR. Doing well. PRN for pain Soft diet Go slow NG out PIV out  Monitor arm and do warm compresses Antibiotics completely on POD 5 TPN for now [facility]   Deena Farrier, MD [facility] [address] [place], [place] [address] [phone] (office)

## 2023-08-24 NOTE — Consult Note (Addendum)
 WOC Nurse ostomy follow up Pt states she was independent with pouch application and emptying prior to admission.  She was using barrier rings and 2 piece pouching systems.  Ileostomy stoma is red and viable, 1 1/2 inches, above skin level.  Staples at 9:00 o'clock; surgical team wants these covered so silicone tape applied, then a barrier ring and 2 piece pouching system. Pt will be able to use her same supplies as prior to admission after she is discharged.   50cc liquid brown stool emptied and abd binder replaced.  3 sets of each supply left at the bedside for staff nurses' use: Use Supplies: barrier rings, Timm Foot # [phone], wafer Timm Foot # 2, pouch Lawson # [id number] (Staples on the lateral edge-covered with the wafer); do not want the staples exposed keep them covered with silicone tape under wafer. Pt denies further questions and plans to d/c to a SNF for further assistance after discharge, according to progress notes.   Thank-you,  Wiliam Harder MSN, RN, CWOCN, [place], CNS [phone]

## 2023-08-24 NOTE — Plan of Care (Signed)
  Problem: Education: Goal: Knowledge of General Education information will improve Description: Including pain rating scale, medication(s)/side effects and non-pharmacologic comfort measures Outcome: Progressing   Problem: Clinical Measurements: Goal: Ability to maintain clinical measurements within normal limits will improve Outcome: Progressing Goal: Will remain free from infection Outcome: Progressing Goal: Cardiovascular complication will be avoided Outcome: Progressing   Problem: Activity: Goal: Risk for activity intolerance will decrease Outcome: Progressing   Problem: Nutrition: Goal: Adequate nutrition will be maintained Outcome: Progressing   Problem: Coping: Goal: Level of anxiety will decrease Outcome: Progressing   Problem: Pain Managment: Goal: General experience of comfort will improve and/or be controlled Outcome: Progressing

## 2023-08-24 NOTE — Progress Notes (Signed)
 Patient ID: Dorothy Harris, female   DOB: [birth year], 63 y.o.   MRN: [id number] S: Feeling better today O:BP 121/78 (BP Location: Left Arm)   Pulse (!) 59   Temp 98.8 F (37.1 C) (Oral)   Resp 16   Ht 5\' 7"  (1.702 m)   Wt 105.7 kg   SpO2 99%   BMI 36.50 kg/m   Intake/Output Summary (Last 24 hours) at 08/24/2023 0909 Last data filed at 08/24/2023 0700 Gross per 24 hour  Intake 2655.36 ml  Output 3710 ml  Net -1054.64 ml   Intake/Output: I/O last 3 completed shifts: In: 2655.4 [I.V.:2443.9; IV Piggyback:211.5] Out: 4450 [Urine:3475; Stool:975]  Intake/Output this shift:  No intake/output data recorded. Weight change: -1.2 kg Gen: NAD CVS: bradycardic at 59 Resp: CTA Abd: distended, hypoactive bowel sounds, abdominal wrap in place Ext: 1+ ankle and pretibial edema bilaterally  Recent Labs  Lab 08/19/23 0411 08/20/23 0419 08/20/23 0420 08/21/23 0436 08/21/23 0438 08/21/23 1351 08/22/23 0508 08/23/23 0549 08/24/23 0405  NA 140 142 142 139 139  --  135 137 136  K 4.7 4.8 4.8 5.9* 5.9* 5.1 4.1 3.7 3.9  CL 111 112* 112* 112* 111  --  105 104 105  CO2 22 20* 20* 18* 18*  --  19* 24 24  GLUCOSE 171* 154* 156* 245* 247*  --  209* 136* 145*  BUN 79* 77* 77* 99* 99*  --  100* 95* 84*  CREATININE 2.28* 2.14* 2.23* 2.99* 3.01*  --  3.16* 3.14* 2.68*  ALBUMIN  1.7* 1.9* 1.9*  --  1.8*  --  1.5* 1.5* <1.5*  CALCIUM  8.1* 8.5* 8.5* 7.9* 7.9*  --  7.7* 7.9* 7.8*  PHOS 3.5 3.3 3.3 4.3 4.3  --  5.4* 4.9* 3.2  AST  --  41  --   --   --   --   --   --  10*  ALT  --  68*  --   --   --   --   --   --  16   Liver Function Tests: Recent Labs  Lab 08/20/23 0419 08/20/23 0420 08/22/23 0508 08/23/23 0549 08/24/23 0405  AST 41  --   --   --  10*  ALT 68*  --   --   --  16  ALKPHOS 160*  --   --   --  91  BILITOT 0.4  --   --   --  0.4  PROT 5.4*  --   --   --  5.0*  ALBUMIN  1.9*   < > 1.5* 1.5* <1.5*   < > = values in this interval not displayed.   No results for input(s):  "LIPASE", "AMYLASE" in the last 168 hours. No results for input(s): "AMMONIA" in the last 168 hours. CBC: Recent Labs  Lab 08/20/23 0419 08/21/23 0436 08/22/23 0508 08/22/23 1811 08/23/23 0549 08/24/23 0405  WBC 10.8* 21.1* 16.8*  --  15.0* 12.3*  NEUTROABS 8.4* 17.4*  --   --   --   --   HGB 8.7* 8.8* 7.0* 8.7* 8.4* 8.0*  HCT 27.8* 28.8* 23.3* 26.8* 26.3* 23.9*  MCV 101.5* 104.3* 102.6*  --  95.6 96.0  PLT 300 348 283  --  297 298   Cardiac Enzymes: No results for input(s): "CKTOTAL", "CKMB", "CKMBINDEX", "TROPONINI" in the last 168 hours. CBG: Recent Labs  Lab 08/23/23 0550 08/23/23 1115 08/23/23 1653 08/23/23 2335 08/24/23 0614  GLUCAP 136* 150* 148* 145* 166*  Iron Studies: No results for input(s): "IRON", "TIBC", "TRANSFERRIN", "FERRITIN" in the last 72 hours. Studies/Results: No results found.  acetaminophen   1,000 mg Oral Q6H   Chlorhexidine  Gluconate Cloth  6 each Topical Daily   heparin  injection (subcutaneous)  5,000 Units Subcutaneous Q8H   insulin  aspart  0-15 Units Subcutaneous Q6H   metoprolol  tartrate  5 mg Intravenous Q4H   pantoprazole  (PROTONIX ) IV  40 mg Intravenous Q12H   sodium chloride  flush  10-40 mL Intracatheter Q12H    BMET    Component Value Date/Time   NA 136 08/24/2023 0405   NA 140 05/05/2023 0830   K 3.9 08/24/2023 0405   CL 105 08/24/2023 0405   CO2 24 08/24/2023 0405   GLUCOSE 145 (H) 08/24/2023 0405   BUN 84 (H) 08/24/2023 0405   BUN 31 (H) 05/05/2023 0830   CREATININE 2.68 (H) 08/24/2023 0405   CREATININE 1.69 (H) 08/21/2017 0935   CALCIUM  7.8 (L) 08/24/2023 0405   GFRNONAA 20 (L) 08/24/2023 0405   GFRNONAA 33 (L) 08/21/2017 0935   GFRAA 53 (L) 10/07/2017 0720   GFRAA 39 (L) 08/21/2017 0935   CBC    Component Value Date/Time   WBC 12.3 (H) 08/24/2023 0405   RBC 2.49 (L) 08/24/2023 0405   HGB 8.0 (L) 08/24/2023 0405   HGB 10.6 (L) 05/05/2023 0830   HCT 23.9 (L) 08/24/2023 0405   HCT 33.0 (L) 05/05/2023 0830    PLT 298 08/24/2023 0405   PLT 245 05/05/2023 0830   MCV 96.0 08/24/2023 0405   MCV 95 05/05/2023 0830   MCH 32.1 08/24/2023 0405   MCHC 33.5 08/24/2023 0405   RDW 17.6 (H) 08/24/2023 0405   RDW 15.6 (H) 05/05/2023 0830   LYMPHSABS 1.1 08/21/2023 0436   LYMPHSABS 1.7 05/05/2023 0830   MONOABS 2.3 (H) 08/21/2023 0436   EOSABS 0.1 08/21/2023 0436   EOSABS 0.3 05/05/2023 0830   BASOSABS 0.1 08/21/2023 0436   BASOSABS 0.1 05/05/2023 0830   Pt is a 63 y.o. 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    Assessment/Plan:  # Nonoliguric acute kidney injury on CKD stage IV likely due to prerenal insult/reduced renal perfusion: Baseline creatinine level around 2.0. - initially got better, now AKI again- following surgery on 08/20/23 -Scr improving again with IVF's. - will continue to follow.  No indication for dialysis at this time.     # SBO with significant GI output/NG tube decompression:  - s/p exploratory laparotomy with lysis of adhesions, reduction of ventral hernia, and resection of small bowel including the perforated bowel and end ileostomy on 08/20/23 - NGT in place - on TPN- 0K in TPN already, acetate balance maximized per pharmacy- appreciate their assistance   # Acute urinary retention:  - Foley in place presently   #Hyperkalemia: - now resolved with medical management. - continue with low rate bicarb  Benjamin Brands, MD [facility]

## 2023-08-24 NOTE — Progress Notes (Addendum)
 Patient admitted with SBO, now s/p exploratory lab with lysis of adhesions and resection. Issues with new onset afib with RVR this admission. Soft bp's initially, she was started on IV amidarone. Due to extended NPO status had been maintained on amio gtt. Have not started anticoag in setting of need for surgery for SBO. She has also had some issues with postop anemia, Hgb down to 8. Her CHADS2Vasc score is 2 (HTN, gender), in a female patient would only be a IIBAindication to consider anticoagulation. Continue to hold off on anticoag at this time, from surgery's note they also ask to hold off on anticoag at this time.   Tele today shows afib 90s to low 100s. Continue IV amio, transition to oral when ok from surgery standpoint. Will check in with primary team, from surgery's note they were ok with oral meds.    Armida Lander MD

## 2023-08-24 NOTE — Progress Notes (Signed)
 PHARMACY - TOTAL PARENTERAL NUTRITION CONSULT NOTE   Indication: Small bowel obstruction  Patient Measurements: Height: 5\' 7"  (170.2 cm) Weight: 105.7 kg (233 lb 0.4 oz) IBW/kg (Calculated) : 61.6 TPN AdjBW (KG): 72.1 Body mass index is 36.5 kg/m. Usual Weight: 90 kg  Assessment:  Small bowel obstruction with ventral hernia and end ileostomy. S/p ileostomy revision 5/15. Acute on CKD with worsening renal function and elevated potassium - now rormalized   Glucose / Insulin : 145-171, 10 additional units insulin  given. No prior hx DM  Electrolytes: WNL- corrected calcium  ~9.8 Phos 4.9 > 3.2 Renal: Alb 1.5, Scr 3.14> 2.68  Hepatic: WNL TG 127 Intake / Output; MIVF: LR @ 75 mL/hr Central access: PICC TPN start date:  5/8  Nutritional Goals: Goal TPN rate is 80 mL/hr (provides 115 g of protein and 1920 kcals per day)  RD Assessment: Estimated Needs Total Energy Estimated Needs: 1900-2100 Total Protein Estimated Needs: 110-130 gm Total Fluid Estimated Needs: 1.9-2.1 L  Current Nutrition:  Thin clear liquids started 5/16 TPN  Plan:  Continue TPN at 80 mL/hr at 1800 Electrolytes in TPN: Na 65mEq/L, K 30 mEq/L, Ca 24mEq/L, Mg 28mEq/L, and Phos 5 mmol/L. Cl:Ac max Standard MVI and trace elements to TPN Increase insulin  to 18 units in TPN Continue Moderate q6h SSI and adjust as needed  Monitor TPN labs on Mon/Thurs  Cliffton Dama, PharmD Clinical Pharmacist 08/24/2023 8:28 AM      08/24/2023 8:24 AM

## 2023-08-24 NOTE — Progress Notes (Signed)
 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  Patient currently noted to have high-grade SBO with plans for operative repair scheduled 5/15.  AKI has improved and she remains on amiodarone  infusion for atrial fibrillation with RVR.  She has undergone exploratory laparotomy with lysis of adhesions and reduction of ventral hernia with resection of small bowel and end ileostomy on 5/15.  No immediate complications noted.  Status post 2 unit PRBC infusion 5/17, but still with no significant ostomy output.  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   Atrial fibrillation with rapid ventricular response (HCC)   Perforated bowel (HCC)   Mesenteric abscess (HCC)  Assessment and Plan:  High-Grade SBO status post exploratory lap with lysis of adhesions and resection of small bowel with end ileostomy revision 5/15 - Appreciate ongoing general surgery evaluation - Continue TPN -Continue IV antibiotics -Continue pain management -Antiemetics -NG tube to remain in place for now, especially given low ostomy output,  however this is clamped and oral medications could be used per general surgery -Started on clear liquid diet 5/16, advance as recommended   AKI ---acute kidney injury on CKD stage IV -due to dehydration/volume depletion in a patient with SBO and n.p.o. status with high output from NG tube -creatinine on admission= 4.59,  baseline creatinine = 2.0 to 2.2  ,  -creatinine peaked at 8.07 (on 08/09/23) - Continues to remain stable - Nephrology ongoing evaluation appreciated   Acute anemia likely postoperative equilibration-improved - S/p 2 unit PRBC transfusion - Continue to monitor CBC - No overt bleeding noted   Essential hypertension: BP on the soft sides and will require pressors.  - c/n to Hold PTA amlodipine  and oral hydralazine   - Continue to follow VS   Paroxysmal A-fib: -PTA amiodarone  and Coreg  on hold due to n.p.o. status -Patient was not on anticoagulation PTA - Echo 60-65% - Continue IV amiodarone  drip; intermittent boluses will be provided to assist with rate control - Plan to convert to oral amiodarone  once tolerating diet reliably and consistently   HFpEF with LVEF 60-65% Currently volume depleted needing IV fluids due to SBO.   - Started on sodium bicarbonate  infusion per nephrology -continue to follow I's and O's   HLD - Continue to hold PTA atorvastatin  while n.p.o.  acute urinary retention -in and out cath X 3 without success of voiding spontaneously; foley catheter placed and good urine output appreciated. - Creatinine has further improved and back to her baseline at the moment. - No suprapubic tenderness reported.  obesity, class II - BMI 35.81    DVT prophylaxis: Heparin   Code Status: Full Family Communication: Aunt at bedside 5/15 Disposition Plan:  Status is: Inpatient Remains inpatient appropriate because: Need for IV medications and fluids  Nutrition Status: Nutrition Problem: Inadequate oral intake Etiology: altered GI function Signs/Symptoms:  NPO status Interventions: TPN  Consultants:  General surgery Cardiology Nephrology  Procedures:  Ex lap with LOA and reduction of ventral hernia with resection of small bowel and end ileostomy revision 5/15  Antimicrobials:  Anti-infectives (From admission, onward)    Start     Dose/Rate Route Frequency Ordered Stop   08/20/23 1800  piperacillin -tazobactam (ZOSYN ) IVPB 3.375 g        3.375 g 12.5 mL/hr over 240 Minutes Intravenous Every 8 hours 08/20/23 1431 08/25/23 2159   08/20/23 0800  cefoTEtan  (CEFOTAN ) 2 g in sodium chloride  0.9 % 100 mL IVPB        2 g 200 mL/hr over 30 Minutes Intravenous On call to O.R. 08/19/23 7829 08/20/23 0946      Subjective: Patient seen and evaluated today with no significant abdominal pain this morning.  Continues to have ongoing tachycardia with no acute overnight events noted.  Objective: Vitals:   08/24/23 0500 08/24/23 0600 08/24/23 0700 08/24/23 0800  BP: 114/65 119/70 101/64 121/78  Pulse: 96 (!) 102 83 (!) 59  Resp: 11 20 14 16   Temp:    98.8 F (37.1 C)  TempSrc:    Oral  SpO2: 98% 100% 99% 99%  Weight: 105.7 kg     Height:        Intake/Output Summary (Last 24 hours) at 08/24/2023 1040 Last data filed at 08/24/2023 0700 Gross per 24 hour  Intake 2655.36 ml  Output 3710 ml  Net -1054.64 ml   Filed Weights   08/22/23 0500 08/23/23 0500 08/24/23 0500  Weight: 101.8 kg 106.9 kg 105.7 kg    Examination:  General exam: Appears calm and comfortable  Respiratory system: Clear to auscultation. Respiratory effort normal. Cardiovascular system: S1 & S2 heard, irregular and tachycardic. Gastrointestinal system: Abdominal binder present and colostomy bag with bloody drainage, NG tube to suction Central nervous system: Alert and awake Extremities: No edema Skin: No significant lesions noted Psychiatry: Flat affect.    Data Reviewed: I have personally reviewed following labs and imaging studies  CBC: Recent Labs  Lab  08/20/23 0419 08/21/23 0436 08/22/23 0508 08/22/23 1811 08/23/23 0549 08/24/23 0405  WBC 10.8* 21.1* 16.8*  --  15.0* 12.3*  NEUTROABS 8.4* 17.4*  --   --   --   --   HGB 8.7* 8.8* 7.0* 8.7* 8.4* 8.0*  HCT 27.8* 28.8* 23.3* 26.8* 26.3* 23.9*  MCV 101.5* 104.3* 102.6*  --  95.6 96.0  PLT 300 348 283  --  297 298   Basic Metabolic Panel: Recent Labs  Lab 08/20/23 0419 08/20/23 0420 08/21/23 0436 08/21/23 0438 08/21/23 1351 08/22/23 0508 08/23/23 0549 08/24/23 0405  NA 142   < > 139 139  --  135 137 136  K 4.8   < > 5.9* 5.9* 5.1 4.1 3.7 3.9  CL 112*   < > 112* 111  --  105 104 105  CO2 20*   < > 18* 18*  --  19* 24 24  GLUCOSE 154*   < > 245* 247*  --  209* 136* 145*  BUN 77*   < > 99* 99*  --  100* 95* 84*  CREATININE 2.14*   < > 2.99* 3.01*  --  3.16* 3.14* 2.68*  CALCIUM   8.5*   < > 7.9* 7.9*  --  7.7* 7.9* 7.8*  MG 2.3  --  2.1  --   --  2.0 2.1 2.0  PHOS 3.3   < > 4.3 4.3  --  5.4* 4.9* 3.2   < > = values in this interval not displayed.   GFR: Estimated Creatinine Clearance: 27.2 mL/min (A) (by C-G formula based on SCr of 2.68 mg/dL (H)). Liver Function Tests: Recent Labs  Lab 08/20/23 0419 08/20/23 0420 08/21/23 0438 08/22/23 0508 08/23/23 0549 08/24/23 0405  AST 41  --   --   --   --  10*  ALT 68*  --   --   --   --  16  ALKPHOS 160*  --   --   --   --  91  BILITOT 0.4  --   --   --   --  0.4  PROT 5.4*  --   --   --   --  5.0*  ALBUMIN  1.9* 1.9* 1.8* 1.5* 1.5* <1.5*   No results for input(s): "LIPASE", "AMYLASE" in the last 168 hours. No results for input(s): "AMMONIA" in the last 168 hours. Coagulation Profile: No results for input(s): "INR", "PROTIME" in the last 168 hours. Cardiac Enzymes: No results for input(s): "CKTOTAL", "CKMB", "CKMBINDEX", "TROPONINI" in the last 168 hours. BNP (last 3 results) No results for input(s): "PROBNP" in the last 8760 hours. HbA1C: No results for input(s): "HGBA1C" in the last 72 hours. CBG: Recent Labs  Lab  08/23/23 0550 08/23/23 1115 08/23/23 1653 08/23/23 2335 08/24/23 0614  GLUCAP 136* 150* 148* 145* 166*   Lipid Profile: Recent Labs    08/24/23 0405  TRIG 127    Thyroid Function Tests: No results for input(s): "TSH", "T4TOTAL", "FREET4", "T3FREE", "THYROIDAB" in the last 72 hours. Anemia Panel: No results for input(s): "VITAMINB12", "FOLATE", "FERRITIN", "TIBC", "IRON", "RETICCTPCT" in the last 72 hours. Sepsis Labs: No results for input(s): "PROCALCITON", "LATICACIDVEN" in the last 168 hours.  Recent Results (from the past 240 hours)  Aerobic/Anaerobic Culture w Gram Stain (surgical/deep wound)     Status: None (Preliminary result)   Collection Time: 08/20/23 10:11 AM   Specimen: Abdomen; Abscess  Result Value Ref Range Status   Specimen Description   Final    WOUND Performed at [facility], [address], [place], [place] [address]    Special Requests   Final    NONE Performed at [facility], [address], [place], [place] [address]    Gram Stain   Final    FEW WBC PRESENT, PREDOMINANTLY PMN MODERATE GRAM NEGATIVE RODS FEW GRAM POSITIVE COCCI IN PAIRS RARE GRAM POSITIVE RODS    Culture   Final    FEW PROTEUS MIRABILIS FEW ESCHERICHIA COLI NO ANAEROBES ISOLATED; CULTURE IN PROGRESS FOR 5 DAYS CULTURE REINCUBATED FOR BETTER GROWTH Performed at [facility], [address] [place], [place] [address]    Report Status PENDING  Incomplete   Organism ID, Bacteria PROTEUS MIRABILIS  Final   Organism ID, Bacteria ESCHERICHIA COLI  Final      Susceptibility   Escherichia coli - MIC*    AMPICILLIN 8 SENSITIVE Sensitive     CEFEPIME  <=0.12 SENSITIVE Sensitive     CEFTAZIDIME <=1 SENSITIVE Sensitive     CEFTRIAXONE  <=0.25 SENSITIVE Sensitive     CIPROFLOXACIN <=0.25 SENSITIVE Sensitive     GENTAMICIN <=1 SENSITIVE Sensitive     IMIPENEM <=0.25 SENSITIVE Sensitive  TRIMETH/SULFA <=20 SENSITIVE Sensitive     AMPICILLIN/SULBACTAM 4 SENSITIVE Sensitive      PIP/TAZO <=4 SENSITIVE Sensitive ug/mL    * FEW ESCHERICHIA COLI   Proteus mirabilis - MIC*    AMPICILLIN <=2 SENSITIVE Sensitive     CEFEPIME  <=0.12 SENSITIVE Sensitive     CEFTAZIDIME <=1 SENSITIVE Sensitive     CEFTRIAXONE  <=0.25 SENSITIVE Sensitive     CIPROFLOXACIN <=0.25 SENSITIVE Sensitive     GENTAMICIN <=1 SENSITIVE Sensitive     IMIPENEM 2 SENSITIVE Sensitive     TRIMETH/SULFA <=20 SENSITIVE Sensitive     AMPICILLIN/SULBACTAM <=2 SENSITIVE Sensitive     PIP/TAZO <=4 SENSITIVE Sensitive ug/mL    * FEW PROTEUS MIRABILIS         Radiology Studies: No results found.    Scheduled Meds:  acetaminophen   1,000 mg Oral Q6H   Chlorhexidine  Gluconate Cloth  6 each Topical Daily   heparin  injection (subcutaneous)  5,000 Units Subcutaneous Q8H   insulin  aspart  0-15 Units Subcutaneous Q6H   metoprolol  tartrate  5 mg Intravenous Q4H   pantoprazole  (PROTONIX ) IV  40 mg Intravenous Q12H   sodium chloride  flush  10-40 mL Intracatheter Q12H   Continuous Infusions:  amiodarone  30 mg/hr (08/24/23 0958)   lactated ringers  75 mL/hr at 08/24/23 0958   piperacillin -tazobactam (ZOSYN )  IV 3.375 g (08/24/23 0605)   TPN ADULT (ION) 80 mL/hr at 08/24/23 0700   TPN ADULT (ION)       LOS: 17 days    Time spent: 55 minutes    Angelis Gates Loran Rock, DO [facility]  If 7PM-7AM, please contact night-coverage [email or web address] 08/24/2023, 10:40 AM

## 2023-08-24 NOTE — Progress Notes (Signed)
   08/24/23 1445  Spiritual Encounters  Type of Visit Initial  Care provided to: Patient  Conversation partners present during encounter Nurse  Referral source Patient request  Reason for visit Routine spiritual support  OnCall Visit No  Spiritual Framework  Presenting Themes Meaning/purpose/sources of inspiration;Courage hope and growth;Significant life change  Community/Connection Limited  Patient Stress Factors Major life changes;Health changes;Loss;Loss of control  Family Stress Factors None identified  Interventions  Spiritual Care Interventions Made Established relationship of care and support;Compassionate presence;Normalization of emotions;Prayer  Intervention Outcomes  Outcomes Connection to spiritual care;Reduced anxiety  Spiritual Care Plan  Spiritual Care Issues Still Outstanding Chaplain will continue to follow   Found Dorothy Harris laying in hospital bed with eyes open and alert. She welcomed Chaplain bedside and stated that she would like to rest but that she wanted Chaplain to pray with her. After prayer, she said that she would like Chaplain to return tomorrow for a visit. Chaplain will visit next day in order to provide spiritual support and to assess for spiritual need.   Madeleine Schaffer, M.Div Chaplain

## 2023-08-24 NOTE — TOC Progression Note (Signed)
 Transition of Care [facility]) - Progression Note    Patient Details  Name: Dorothy Harris MRN: [id number] Date of Birth: Aug 28, 1960  [facility] CM/SW Contact  Grandville Lax, [place] Phone Number: 08/24/2023, 11:41 AM  Clinical Narrative:    CSW reached out to admissions at both [facility]. Pt still needs days so insurance auth for SNF will not be started at this time. TOC to follow.   Expected Discharge Plan: Home/Self Care Barriers to Discharge: Continued Medical Work up  Expected Discharge Plan and Services In-house Referral: Clinical Social Work Discharge Planning Services: CM Consult   Living arrangements for the past 2 months: Single Family Home                                       Social Determinants of Health (SDOH) Interventions SDOH Screenings   Food Insecurity: Food Insecurity Present (08/07/2023)  Housing: High Risk (08/07/2023)  Transportation Needs: No Transportation Needs (08/07/2023)  Utilities: Not At Risk (08/07/2023)  Alcohol Screen: Low Risk  (05/07/2021)  Depression (PHQ2-9): Medium Risk (05/05/2023)  Financial Resource Strain: Low Risk  (05/07/2021)  Physical Activity: Inactive (05/07/2021)  Tobacco Use: High Risk (08/20/2023)    Readmission Risk Interventions    08/11/2023    8:27 AM 08/07/2023    7:29 AM 01/22/2022   12:57 PM  Readmission Risk Prevention Plan  Medication Screening  Complete   Transportation Screening Complete Complete Complete  PCP or Specialist Appt within 3-5 Days   Complete  HRI or Home Care Consult Complete  Complete  Social Work Consult for Recovery Care Planning/Counseling Complete  Complete  Palliative Care Screening Not Applicable  Not Applicable  Medication Review Oceanographer) Complete  Referral to Pharmacy

## 2023-08-25 DIAGNOSIS — K56609 Unspecified intestinal obstruction, unspecified as to partial versus complete obstruction: Secondary | ICD-10-CM | POA: Diagnosis not present

## 2023-08-25 LAB — AEROBIC/ANAEROBIC CULTURE W GRAM STAIN (SURGICAL/DEEP WOUND)

## 2023-08-25 LAB — CBC
HCT: 25.1 % — ABNORMAL LOW (ref 36.0–46.0)
Hemoglobin: 7.9 g/dL — ABNORMAL LOW (ref 12.0–15.0)
MCH: 30.5 pg (ref 26.0–34.0)
MCHC: 31.5 g/dL (ref 30.0–36.0)
MCV: 96.9 fL (ref 80.0–100.0)
Platelets: 295 10*3/uL (ref 150–400)
RBC: 2.59 MIL/uL — ABNORMAL LOW (ref 3.87–5.11)
RDW: 17.2 % — ABNORMAL HIGH (ref 11.5–15.5)
WBC: 12 10*3/uL — ABNORMAL HIGH (ref 4.0–10.5)
nRBC: 0.2 % (ref 0.0–0.2)

## 2023-08-25 LAB — GLUCOSE, CAPILLARY
Glucose-Capillary: 156 mg/dL — ABNORMAL HIGH (ref 70–99)
Glucose-Capillary: 137 mg/dL — ABNORMAL HIGH (ref 70–99)
Glucose-Capillary: 150 mg/dL — ABNORMAL HIGH (ref 70–99)
Glucose-Capillary: 108 mg/dL — ABNORMAL HIGH (ref 70–99)

## 2023-08-25 LAB — RENAL FUNCTION PANEL
Albumin: <1.5 g/dL — ABNORMAL LOW (ref 3.5–5.0)
Anion gap: 5 (ref 5–15)
BUN: 77 mg/dL — ABNORMAL HIGH (ref 8–23)
CO2: 27 mmol/L (ref 22–32)
Calcium: 8 mg/dL — ABNORMAL LOW (ref 8.9–10.3)
Chloride: 105 mmol/L (ref 98–111)
Creatinine, Ser: 2.38 mg/dL — ABNORMAL HIGH (ref 0.44–1.00)
GFR, Estimated: 23 mL/min — ABNORMAL LOW (ref >60)
Glucose, Bld: 138 mg/dL — ABNORMAL HIGH (ref 70–99)
Phosphorus: 2.5 mg/dL (ref 2.5–4.6)
Potassium: 4.1 mmol/L (ref 3.5–5.1)
Sodium: 137 mmol/L (ref 135–145)

## 2023-08-25 LAB — MAGNESIUM: Magnesium: 2 mg/dL (ref 1.7–2.4)

## 2023-08-25 MED ORDER — HYDROMORPHONE HCL 1 MG/ML IJ SOLN
0.5000 mg | INTRAMUSCULAR | Status: DC | PRN
  Administered 2023-08-25 – 2023-08-29 (×12): 0.5 mg via INTRAVENOUS
  Filled 2023-08-25 (×12): qty 0.5

## 2023-08-25 MED ORDER — ENSURE ENLIVE PO LIQD
237.0000 mL | Freq: Three times a day (TID) | ORAL | Status: DC
  Administered 2023-08-25 – 2023-08-30 (×6): 237 mL via ORAL

## 2023-08-25 MED ORDER — AMIODARONE LOAD VIA INFUSION
150.0000 mg | Freq: Once | INTRAVENOUS | Status: AC
  Administered 2023-08-25: 150 mg via INTRAVENOUS
  Filled 2023-08-25: qty 83.34

## 2023-08-25 MED ORDER — AMIODARONE HCL IN DEXTROSE 360-4.14 MG/200ML-% IV SOLN
30.0000 mg/h | INTRAVENOUS | Status: DC
  Administered 2023-08-26 – 2023-08-28 (×4): 30 mg/h via INTRAVENOUS
  Filled 2023-08-25 (×6): qty 200

## 2023-08-25 MED ORDER — OXYCODONE HCL 5 MG PO TABS
5.0000 mg | ORAL_TABLET | ORAL | Status: DC | PRN
  Administered 2023-08-25: 5 mg via ORAL
  Administered 2023-08-25 – 2023-08-26 (×4): 10 mg via ORAL
  Administered 2023-08-27 (×4): 5 mg via ORAL
  Administered 2023-08-27 – 2023-08-30 (×8): 10 mg via ORAL
  Filled 2023-08-25 (×5): qty 2
  Filled 2023-08-25: qty 1
  Filled 2023-08-25 (×3): qty 2
  Filled 2023-08-25: qty 1
  Filled 2023-08-25 (×3): qty 2
  Filled 2023-08-25: qty 1
  Filled 2023-08-25: qty 2
  Filled 2023-08-25 (×2): qty 1

## 2023-08-25 MED ORDER — ENSURE ENLIVE PO LIQD: 237.0000 mL | Freq: Two times a day (BID) | ORAL | Status: DC

## 2023-08-25 MED ORDER — AMIODARONE HCL 200 MG PO TABS
200.0000 mg | ORAL_TABLET | Freq: Two times a day (BID) | ORAL | Status: DC
Start: 2023-08-25 — End: 2023-08-25
  Administered 2023-08-25: 200 mg via ORAL
  Filled 2023-08-25: qty 1

## 2023-08-25 MED ORDER — AMIODARONE HCL IN DEXTROSE 360-4.14 MG/200ML-% IV SOLN
60.0000 mg/h | INTRAVENOUS | Status: DC
  Administered 2023-08-25 (×2): 60 mg/h via INTRAVENOUS

## 2023-08-25 NOTE — Progress Notes (Signed)
 Per verbal from Dr Mason Sole, Foley catheter to remain right now until patient becomes more ambulatory and getting up to chair. Patient is currently refusing, Dr Mason Sole aware. Dr Mason Sole also stated the need for better Urine output documentation needed.

## 2023-08-25 NOTE — Progress Notes (Addendum)
 Nutrition Follow-up  DOCUMENTATION CODES:   Not applicable  INTERVENTION:   Continue TPN to meet 100% of nutrition needs.  Ensure Enlive po TID, each supplement provides 350 kcal and 20 grams of protein.  NUTRITION DIAGNOSIS:   Inadequate oral intake related to altered GI function as evidenced by meal completion < 50%.  Ongoing   GOAL:   Patient will meet greater than or equal to 90% of their needs  Met with TPN  MONITOR:   PO intake, Supplement acceptance  REASON FOR ASSESSMENT:   Consult New TPN/TNA  ASSESSMENT:   63 yo female admitted with SBO associated with ventral hernia, AKI. 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  5/15: S/P ex lap, LOA, reduction of ventral hernia, small bowel resection, end ileostomy revision.   NG tube removed 5/19.   Diet advanced to soft diet yesterday, with no red meat products and no raw vegetables.   Patient states she is feeling better today. She is glad to be able to eat solid food. Agreed to resume Ensure supplements.   TPN at 80 ml/h is providing 1920 kcal and 115 gm protein daily (100% of estimated needs).   Labs reviewed.  CBG: 150-167 x past 24 h  Medications reviewed and include novolog , protonix . IVF: LR at 50 ml/h  Admit weight: 89 kg Current weight 106.9 kg  Ileostomy output 775 ml x 24 hours UOP 2,400 ml x 24 hours  Diet Order:   Diet Order             DIET SOFT Fluid consistency: Thin  Diet effective now                   EDUCATION NEEDS:   No education needs have been identified at this time  Skin:  Skin Assessment: Reviewed RN Assessment  Last BM:  5/20 ileostomy  Height:   Ht Readings from Last 1 Encounters:  08/20/23 5\' 7"  (1.702 m)    Weight:   Wt Readings from Last 1 Encounters:  08/25/23 106.9 kg    Ideal Body Weight:  61.4 kg  BMI:  Body mass index is 36.91 kg/m.  Estimated Nutritional Needs:   Kcal:   1900-2100  Protein:  110-130 gm  Fluid:  1.9-2.1 L   Barnet Boots RD, LDN, CNSC Contact via secure chat. If unavailable, use group chat "RD Inpatient."

## 2023-08-25 NOTE — Progress Notes (Addendum)
 [facility]  Doing fair. Up to the chair. Does not want to get out of the bed per RN and PT.   Eating but not taking in a lot. Added ensure today. Will let TPN stop.  BP 123/85   Pulse (!) 122   Temp 98.4 F (36.9 C) (Oral)   Resp 17   Ht 5\' 7"  (1.702 m)   Wt 106.9 kg   SpO2 96%   BMI 36.91 kg/m  Ostomy pink and with output Midline honeycomb removed, small blood staining after removed in the upper abdomen, replaced ABD and paper tape   Patient doing well after Ex lap, LOA, end ileostomy revision, SBR.  PRN for pain, increased roxicodone  dosing and decreased frequency and dose of dilaudid   Soft diet Antibiotics until POD 5, sensitive to zosyn   TPN stopped today  Ostomy Care  Getting closer to dc, maybe next 48 hours Foley in preoperatively for reported retention, AKI, defer removal to primary team If H&H stable tomorrow can start her anticoagulation for her cardiac issues   Deena Farrier, MD [facility] [address] [place], [place] [address] [phone] (office)

## 2023-08-25 NOTE — Progress Notes (Signed)
 Patient ID: KENDYL FESTA, female   DOB: 09-20-1960, 63 y.o.   MRN: [id number] S: Feeling better and able to eat O:BP 110/78   Pulse 91   Temp 98.4 F (36.9 C) (Oral)   Resp 17   Ht 5\' 7"  (1.702 m)   Wt 106.9 kg   SpO2 96%   BMI 36.91 kg/m   Intake/Output Summary (Last 24 hours) at 08/25/2023 1048 Last data filed at 08/25/2023 1005 Gross per 24 hour  Intake 3839.5 ml  Output 3175 ml  Net 664.5 ml   Intake/Output: I/O last 3 completed shifts: In: 6454.9 [I.V.:6059; IV Piggyback:395.9] Out: 5775 [Urine:4200; Stool:1575]  Intake/Output this shift:  Total I/O In: 40 [I.V.:40] Out: -  Weight change: 1.2 kg Gen: NAD CVS: RRR Resp:CTA Abd: +BS, nontender Ext: trace pretibial edema bilaterally  Recent Labs  Lab 08/20/23 0419 08/20/23 0420 08/21/23 0436 08/21/23 0438 08/21/23 1351 08/22/23 0508 08/23/23 0549 08/24/23 0405 08/25/23 0445  NA 142 142 139 139  --  135 137 136 137  K 4.8 4.8 5.9* 5.9* 5.1 4.1 3.7 3.9 4.1  CL 112* 112* 112* 111  --  105 104 105 105  CO2 20* 20* 18* 18*  --  19* 24 24 27   GLUCOSE 154* 156* 245* 247*  --  209* 136* 145* 138*  BUN 77* 77* 99* 99*  --  100* 95* 84* 77*  CREATININE 2.14* 2.23* 2.99* 3.01*  --  3.16* 3.14* 2.68* 2.38*  ALBUMIN  1.9* 1.9*  --  1.8*  --  1.5* 1.5* <1.5* <1.5*  CALCIUM  8.5* 8.5* 7.9* 7.9*  --  7.7* 7.9* 7.8* 8.0*  PHOS 3.3 3.3 4.3 4.3  --  5.4* 4.9* 3.2 2.5  AST 41  --   --   --   --   --   --  10*  --   ALT 68*  --   --   --   --   --   --  16  --    Liver Function Tests: Recent Labs  Lab 08/20/23 0419 08/20/23 0420 08/23/23 0549 08/24/23 0405 08/25/23 0445  AST 41  --   --  10*  --   ALT 68*  --   --  16  --   ALKPHOS 160*  --   --  91  --   BILITOT 0.4  --   --  0.4  --   PROT 5.4*  --   --  5.0*  --   ALBUMIN  1.9*   < > 1.5* <1.5* <1.5*   < > = values in this interval not displayed.   No results for input(s): "LIPASE", "AMYLASE" in the last 168 hours. No results for input(s): "AMMONIA" in the last  168 hours. CBC: Recent Labs  Lab 08/20/23 0419 08/21/23 0436 08/22/23 0508 08/22/23 1811 08/23/23 0549 08/24/23 0405 08/25/23 0445  WBC 10.8* 21.1* 16.8*  --  15.0* 12.3* 12.0*  NEUTROABS 8.4* 17.4*  --   --   --   --   --   HGB 8.7* 8.8* 7.0*   < > 8.4* 8.0* 7.9*  HCT 27.8* 28.8* 23.3*   < > 26.3* 23.9* 25.1*  MCV 101.5* 104.3* 102.6*  --  95.6 96.0 96.9  PLT 300 348 283  --  297 298 295   < > = values in this interval not displayed.   Cardiac Enzymes: No results for input(s): "CKTOTAL", "CKMB", "CKMBINDEX", "TROPONINI" in the last 168 hours. CBG: Recent Labs  Lab 08/24/23 0614 08/24/23 1139 08/24/23 1806 08/24/23 2345 08/25/23 0608  GLUCAP 166* 167* 154* 150* 156*    Iron Studies: No results for input(s): "IRON", "TIBC", "TRANSFERRIN", "FERRITIN" in the last 72 hours. Studies/Results: No results found.  acetaminophen   1,000 mg Oral Q6H   Chlorhexidine  Gluconate Cloth  6 each Topical Daily   heparin  injection (subcutaneous)  5,000 Units Subcutaneous Q8H   insulin  aspart  0-15 Units Subcutaneous Q6H   metoprolol  tartrate  5 mg Intravenous Q4H   pantoprazole  (PROTONIX ) IV  40 mg Intravenous Q12H   sodium chloride  flush  10-40 mL Intracatheter Q12H    BMET    Component Value Date/Time   NA 137 08/25/2023 0445   NA 140 05/05/2023 0830   K 4.1 08/25/2023 0445   CL 105 08/25/2023 0445   CO2 27 08/25/2023 0445   GLUCOSE 138 (H) 08/25/2023 0445   BUN 77 (H) 08/25/2023 0445   BUN 31 (H) 05/05/2023 0830   CREATININE 2.38 (H) 08/25/2023 0445   CREATININE 1.69 (H) 08/21/2017 0935   CALCIUM  8.0 (L) 08/25/2023 0445   GFRNONAA 23 (L) 08/25/2023 0445   GFRNONAA 33 (L) 08/21/2017 0935   GFRAA 53 (L) 10/07/2017 0720   GFRAA 39 (L) 08/21/2017 0935   CBC    Component Value Date/Time   WBC 12.0 (H) 08/25/2023 0445   RBC 2.59 (L) 08/25/2023 0445   HGB 7.9 (L) 08/25/2023 0445   HGB 10.6 (L) 05/05/2023 0830   HCT 25.1 (L) 08/25/2023 0445   HCT 33.0 (L) 05/05/2023 0830    PLT 295 08/25/2023 0445   PLT 245 05/05/2023 0830   MCV 96.9 08/25/2023 0445   MCV 95 05/05/2023 0830   MCH 30.5 08/25/2023 0445   MCHC 31.5 08/25/2023 0445   RDW 17.2 (H) 08/25/2023 0445   RDW 15.6 (H) 05/05/2023 0830   LYMPHSABS 1.1 08/21/2023 0436   LYMPHSABS 1.7 05/05/2023 0830   MONOABS 2.3 (H) 08/21/2023 0436   EOSABS 0.1 08/21/2023 0436   EOSABS 0.3 05/05/2023 0830   BASOSABS 0.1 08/21/2023 0436   BASOSABS 0.1 05/05/2023 0830     Pt is a 63 y.o. 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     Assessment/Plan:   # Nonoliguric acute kidney injury on CKD stage IV likely due to prerenal insult/reduced renal perfusion: Baseline creatinine level around 2.0. - initially got better, now AKI again- following surgery on 08/20/23 -Scr improving again with IVF's and near baseline.  Will decrease IVF's to 50 mL/hr and hopefully can stop tomorrow since she is now taking po. - will sign off for now as nothing further to add.  She will need outpatient follow up with us  in 3-4 weeks after discharge. Please call with any questions or concerns.     # SBO with significant GI output/NG tube decompression:  - s/p exploratory laparotomy with lysis of adhesions, reduction of ventral hernia, and resection of small bowel including the perforated bowel and end ileostomy on 08/20/23 - NGT in place - on TPN- 0K in TPN already, acetate balance maximized per pharmacy- appreciate their assistance   # Acute urinary retention:  - Foley in place presently   #Hyperkalemia: - now resolved with medical management.  #Acute blood loss anemia: -transfuse for Hgb <7   Benjamin Brands,  MD [facility]

## 2023-08-25 NOTE — Plan of Care (Signed)
  Problem: Education: Goal: Knowledge of General Education information will improve Description: Including pain rating scale, medication(s)/side effects and non-pharmacologic comfort measures Outcome: Progressing   Problem: Clinical Measurements: Goal: Ability to maintain clinical measurements within normal limits will improve Outcome: Progressing Goal: Will remain free from infection Outcome: Progressing Goal: Diagnostic test results will improve Outcome: Progressing Goal: Respiratory complications will improve Outcome: Progressing Goal: Cardiovascular complication will be avoided Outcome: Progressing   Problem: Activity: Goal: Risk for activity intolerance will decrease Outcome: Progressing   Problem: Coping: Goal: Level of anxiety will decrease Outcome: Progressing   Problem: Pain Managment: Goal: General experience of comfort will improve and/or be controlled Outcome: Progressing   Problem: Safety: Goal: Ability to remain free from injury will improve Outcome: Progressing   Problem: Skin Integrity: Goal: Risk for impaired skin integrity will decrease Outcome: Progressing

## 2023-08-25 NOTE — Plan of Care (Signed)

## 2023-08-25 NOTE — Progress Notes (Signed)
 Physical Therapy Treatment Patient Details Name: Dorothy Harris MRN: [id number] DOB: [birth year] Today's Date: 08/25/2023   History of Present Illness SOHA THORUP is a 63 y.o. 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  Patient states that she has not been able to tolerate any oral intake, she complained of abdominal pain which was cramping in nature.  Colostomy has had only minimal output.  EMS was activated and arrival of EMS team, patient blood pressure was hypotensive with SBP in 70s, IV NS 250 mL bolus was given with SBP improving to 100's.  She was taken to the ED for further evaluation and management.    PT Comments  REASSESSMENT: Patient s/p Exploratory laparotomy, lysis of adhesions, reduction of ventral hernia, resection of small bowel including the perforated bowel and end ileostomy on 08/20/23. Patient apprehensive for getting and requires much encouragement for participating with therapy. Patient demonstrates slow labored movement for sitting up at bedside with fair/good return for using BUE to help scoot to EOB, had most difficulty completing sit to stands due to BLE weakness and limited to a few side steps at bedside before having to sit due to fatigue/generalized weakness. Patient tolerated sitting up in chair after therapy. Patient will benefit from continued skilled physical therapy in hospital and recommended venue below to increase strength, balance, endurance for safe ADLs and gait.      If plan is discharge home, recommend the following: A lot of help with bathing/dressing/bathroom;A lot of help with  walking and/or transfers;Help with stairs or ramp for entrance;Assistance with cooking/housework   Can travel by private vehicle     No  Equipment Recommendations  None recommended by PT    Recommendations for Other Services       Precautions / Restrictions Precautions Precautions: Fall Recall of Precautions/Restrictions: Intact Restrictions Weight Bearing Restrictions Per Provider Order: No     Mobility  Bed Mobility Overal bed mobility: Needs Assistance Bed Mobility: Supine to Sit     Supine to sit: Mod assist     General bed mobility comments: increased time with labored movement, fair/good return for scooting to EOB after verbal cueing    Transfers Overall transfer level: Needs assistance Equipment used: Rolling walker (2 wheels) Transfers: Sit to/from Stand, Bed to chair/wheelchair/BSC Sit to Stand: Mod assist, Max assist   Step pivot transfers: Mod assist       General transfer comment: has difficulty completing sit to stands due to BLE weakness    Ambulation/Gait Ambulation/Gait assistance: Mod assist, Max assist Gait Distance (Feet): 5 Feet Assistive device: Rolling walker (2 wheels) Gait Pattern/deviations: Decreased step length - right, Decreased step length - left, Decreased stride length, Knees buckling Gait velocity: slow     General Gait Details: limited to a few slow labored unsteady side steps with occasional buckling of knees due to weakness   Stairs             Wheelchair Mobility     Tilt Bed    Modified Rankin (Stroke Patients Only)       Balance Overall balance assessment: Needs assistance Sitting-balance support: Feet supported, No upper extremity supported Sitting balance-Leahy Scale: Fair Sitting  balance - Comments: fair/good seated at EOB   Standing balance support: Reliant on assistive device for balance, During functional activity, Bilateral upper extremity supported Standing balance-Leahy Scale: Poor Standing  balance comment: using RW                            Communication Communication Communication: No apparent difficulties  Cognition Arousal: Alert Behavior During Therapy: Anxious, WFL for tasks assessed/performed, Agitated   PT - Cognitive impairments: No apparent impairments                         Following commands: Intact      Cueing Cueing Techniques: Verbal cues, Tactile cues  Exercises      General Comments        Pertinent Vitals/Pain Pain Assessment Pain Assessment: Faces Faces Pain Scale: Hurts little more Pain Location: stomach at sight of surgery Pain Descriptors / Indicators: Sore, Grimacing Pain Intervention(s): Limited activity within patient's tolerance, Monitored during session, Repositioned    Home Living                          Prior Function            PT Goals (current goals can now be found in the care plan section) Acute Rehab PT Goals Patient Stated Goal: Return home with family to assist PT Goal Formulation: With patient Time For Goal Achievement: 09/02/23 Potential to Achieve Goals: Good Progress towards PT goals: Progressing toward goals    Frequency    Min 3X/week      PT Plan      Co-evaluation              AM-PAC PT "6 Clicks" Mobility   Outcome Measure  Help needed turning from your back to your side while in a flat bed without using bedrails?: A Little Help needed moving from lying on your back to sitting on the side of a flat bed without using bedrails?: A Lot Help needed moving to and from a bed to a chair (including a wheelchair)?: A Lot Help needed standing up from a chair using your arms (e.g., wheelchair or bedside chair)?: A Lot Help needed to walk in hospital room?: A Lot Help needed climbing 3-5 steps with a railing? : Total 6 Click Score: 12    End of Session   Activity Tolerance: Patient tolerated treatment well;Patient limited by fatigue Patient left: in  chair;with call bell/phone within reach Nurse Communication: Mobility status PT Visit Diagnosis: Unsteadiness on feet (R26.81);Other abnormalities of gait and mobility (R26.89);Muscle weakness (generalized) (M62.81)     Time: 4098-1191 PT Time Calculation (min) (ACUTE ONLY): 22 min  Charges:    $Therapeutic Exercise: 23-37 mins PT General Charges $$ ACUTE PT VISIT: 1 Visit                     2:46 PM, 08/25/23 Walton Guppy, MPT Physical Therapist with [facility] [phone] office [phone] mobile phone

## 2023-08-25 NOTE — Progress Notes (Addendum)
 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  Patient currently noted to have high-grade SBO with plans for operative repair scheduled 5/15.  AKI has improved and she remains on amiodarone  infusion for atrial fibrillation with RVR.  She has undergone exploratory laparotomy with lysis of adhesions and reduction of ventral hernia with resection of small bowel and end ileostomy on 5/15.  No immediate complications noted.  Status post 2 unit PRBC infusion 5/17, but still with no significant ostomy output.  She continues to have slow but clinically significant progression and is now tolerating soft diet with improvement in renal function noted.  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   Atrial fibrillation with rapid ventricular response (HCC)   Perforated bowel (HCC)   Mesenteric abscess (HCC)  Assessment and Plan:  High-Grade SBO status post exploratory lap with lysis of adhesions and resection of small bowel with end ileostomy revision 5/15 - Appreciate ongoing general surgery evaluation - Advance to  soft diet, no further need for TPN -Continue IV antibiotics through end of today and then DC -Continue pain management -Antiemetics - Currently on soft diet, NG tube has been removed   AKI ---acute kidney injury on CKD stage IV -due to dehydration/volume depletion in a patient with SBO and n.p.o. status with high output from NG tube-this has resolved - Appreciate nephrology involvement with plans to discontinue further IV fluid by a.m. -Nephrology has signed off   Acute anemia likely postoperative equilibration-improved - S/p 2 unit PRBC transfusion - Continue to monitor CBC - No overt bleeding noted   Essential hypertension: BP on the soft sides and will require pressors.  - c/n to Hold PTA amlodipine  and oral hydralazine   - Continue to follow VS   Paroxysmal A-fib: -PTA amiodarone  and Coreg  on hold due to n.p.o. status -Patient was not on anticoagulation PTA - Echo 60-65% - Continue IV amiodarone  drip; intermittent boluses will be provided to assist with rate control - Plan to convert to oral amiodarone  once tolerating diet reliably and consistently - Can start anticoagulation on 5/21 if H&H stable tomorrow   HFpEF with LVEF 60-65% Currently volume depleted needing IV fluids due to SBO.   - Started on sodium bicarbonate  infusion per nephrology -continue to follow I's and O's   HLD - Continue to hold PTA atorvastatin  while n.p.o.  acute urinary retention - Continue Foley catheter until patient more ambulatory  obesity, class II - BMI 35.81    DVT prophylaxis: Heparin  Code Status: Full Family Communication: Aunt at bedside 5/15 Disposition Plan:  Status is: Inpatient Remains inpatient appropriate because:  Need for IV medications and fluids  Nutrition Status: Nutrition Problem: Inadequate oral intake Etiology: altered GI function Signs/Symptoms: meal completion < 50% Interventions: Ensure Enlive (each supplement provides 350kcal and 20 grams of protein),  TPN  Consultants:  General surgery Cardiology Nephrology  Procedures:  Ex lap with LOA and reduction of ventral hernia with resection of small bowel and end ileostomy revision 5/15  Antimicrobials:  Anti-infectives (From admission, onward)    Start     Dose/Rate Route Frequency Ordered Stop   08/20/23 1800  piperacillin -tazobactam (ZOSYN ) IVPB 3.375 g        3.375 g 12.5 mL/hr over 240 Minutes Intravenous Every 8 hours 08/20/23 1431 08/25/23 2159   08/20/23 0800  cefoTEtan  (CEFOTAN ) 2 g in sodium chloride  0.9 % 100 mL IVPB        2 g 200 mL/hr over 30 Minutes Intravenous On call to O.R. 08/19/23 1610 08/20/23 0946      Subjective: Patient seen and evaluated today with no significant abdominal pain or issues noted overnight.  She is overall feeling better and starting to eat soft diet.  Objective: Vitals:   08/25/23 1301 08/25/23 1302 08/25/23 1303 08/25/23 1304  BP:      Pulse:      Resp: 16 17 18 17   Temp:      TempSrc:      SpO2: 96% 95% 96%   Weight:      Height:        Intake/Output Summary (Last 24 hours) at 08/25/2023 1349 Last data filed at 08/25/2023 1255 Gross per 24 hour  Intake 3547.04 ml  Output 3350 ml  Net 197.04 ml   Filed Weights   08/23/23 0500 08/24/23 0500 08/25/23 0700  Weight: 106.9 kg 105.7 kg 106.9 kg    Examination:  General exam: Appears calm and comfortable  Respiratory system: Clear to auscultation. Respiratory effort normal. Cardiovascular system: S1 & S2 heard, irregular and tachycardic. Gastrointestinal system: Abdominal binder present and colostomy bag with bloody drainage, NG tube to suction Central nervous system: Alert and awake Extremities: No edema Skin: No significant lesions noted Psychiatry: Flat affect.    Data Reviewed: I have personally reviewed following labs and imaging studies  CBC: Recent Labs  Lab 08/20/23 0419 08/21/23 0436 08/22/23 0508 08/22/23 1811 08/23/23 0549 08/24/23 0405 08/25/23 0445   WBC 10.8* 21.1* 16.8*  --  15.0* 12.3* 12.0*  NEUTROABS 8.4* 17.4*  --   --   --   --   --   HGB 8.7* 8.8* 7.0* 8.7* 8.4* 8.0* 7.9*  HCT 27.8* 28.8* 23.3* 26.8* 26.3* 23.9* 25.1*  MCV 101.5* 104.3* 102.6*  --  95.6 96.0 96.9  PLT 300 348 283  --  297 298 295   Basic Metabolic Panel: Recent Labs  Lab 08/21/23 0436 08/21/23 0438 08/21/23 1351 08/22/23 0508 08/23/23 0549 08/24/23 0405 08/25/23 0445  NA 139 139  --  135 137 136 137  K 5.9* 5.9* 5.1 4.1 3.7 3.9 4.1  CL 112* 111  --  105 104 105 105  CO2 18* 18*  --  19* 24 24 27   GLUCOSE 245* 247*  --  209* 136* 145* 138*  BUN 99* 99*  --  100* 95* 84* 77*  CREATININE 2.99* 3.01*  --  3.16* 3.14* 2.68* 2.38*  CALCIUM  7.9* 7.9*  --  7.7* 7.9* 7.8* 8.0*  MG 2.1  --   --  2.0 2.1 2.0 2.0  PHOS 4.3 4.3  --  5.4*  4.9* 3.2 2.5   GFR: Estimated Creatinine Clearance: 30.8 mL/min (A) (by C-G formula based on SCr of 2.38 mg/dL (H)). Liver Function Tests: Recent Labs  Lab 08/20/23 0419 08/20/23 0420 08/21/23 0438 08/22/23 0508 08/23/23 0549 08/24/23 0405 08/25/23 0445  AST 41  --   --   --   --  10*  --   ALT 68*  --   --   --   --  16  --   ALKPHOS 160*  --   --   --   --  91  --   BILITOT 0.4  --   --   --   --  0.4  --   PROT 5.4*  --   --   --   --  5.0*  --   ALBUMIN  1.9*   < > 1.8* 1.5* 1.5* <1.5* <1.5*   < > = values in this interval not displayed.   No results for input(s): "LIPASE", "AMYLASE" in the last 168 hours. No results for input(s): "AMMONIA" in the last 168 hours. Coagulation Profile: No results for input(s): "INR", "PROTIME" in the last 168 hours. Cardiac Enzymes: No results for input(s): "CKTOTAL", "CKMB", "CKMBINDEX", "TROPONINI" in the last 168 hours. BNP (last 3 results) No results for input(s): "PROBNP" in the last 8760 hours. HbA1C: No results for input(s): "HGBA1C" in the last 72 hours. CBG: Recent Labs  Lab 08/24/23 1139 08/24/23 1806 08/24/23 2345 08/25/23 0608 08/25/23 1148  GLUCAP 167*  154* 150* 156* 137*   Lipid Profile: Recent Labs    08/24/23 0405  TRIG 127    Thyroid Function Tests: No results for input(s): "TSH", "T4TOTAL", "FREET4", "T3FREE", "THYROIDAB" in the last 72 hours. Anemia Panel: No results for input(s): "VITAMINB12", "FOLATE", "FERRITIN", "TIBC", "IRON", "RETICCTPCT" in the last 72 hours. Sepsis Labs: No results for input(s): "PROCALCITON", "LATICACIDVEN" in the last 168 hours.  Recent Results (from the past 240 hours)  Aerobic/Anaerobic Culture w Gram Stain (surgical/deep wound)     Status: None (Preliminary result)   Collection Time: 08/20/23 10:11 AM   Specimen: Abdomen; Abscess  Result Value Ref Range Status   Specimen Description   Final    WOUND Performed at [facility], [address] [place], [place] [address]    Special Requests   Final    NONE Performed at [facility], [address], [place], [place] [address]    Gram Stain   Final    FEW WBC PRESENT, PREDOMINANTLY PMN MODERATE GRAM NEGATIVE RODS FEW GRAM POSITIVE COCCI IN PAIRS RARE GRAM POSITIVE RODS Performed at [facility], [address], [place], [place] [address]    Culture   Final    FEW PROTEUS MIRABILIS FEW ESCHERICHIA COLI FEW ENTEROCOCCUS FAECALIS NO ANAEROBES ISOLATED; CULTURE IN PROGRESS FOR 5 DAYS    Report Status PENDING  Incomplete   Organism ID, Bacteria PROTEUS MIRABILIS  Final   Organism ID, Bacteria ESCHERICHIA COLI  Final   Organism ID, Bacteria ENTEROCOCCUS FAECALIS  Final      Susceptibility   Escherichia coli - MIC*    AMPICILLIN 8 SENSITIVE Sensitive     CEFEPIME  <=0.12 SENSITIVE Sensitive     CEFTAZIDIME <=1 SENSITIVE Sensitive     CEFTRIAXONE  <=0.25 SENSITIVE Sensitive     CIPROFLOXACIN <=0.25 SENSITIVE Sensitive     GENTAMICIN <=1 SENSITIVE Sensitive     IMIPENEM <=0.25 SENSITIVE Sensitive     TRIMETH/SULFA <=20 SENSITIVE Sensitive     AMPICILLIN/SULBACTAM 4 SENSITIVE Sensitive  PIP/TAZO <=4 SENSITIVE Sensitive ug/mL    *  FEW ESCHERICHIA COLI   Enterococcus faecalis - MIC*    AMPICILLIN <=2 SENSITIVE Sensitive     VANCOMYCIN  1 SENSITIVE Sensitive     GENTAMICIN SYNERGY SENSITIVE Sensitive     * FEW ENTEROCOCCUS FAECALIS   Proteus mirabilis - MIC*    AMPICILLIN <=2 SENSITIVE Sensitive     CEFEPIME  <=0.12 SENSITIVE Sensitive     CEFTAZIDIME <=1 SENSITIVE Sensitive     CEFTRIAXONE  <=0.25 SENSITIVE Sensitive     CIPROFLOXACIN <=0.25 SENSITIVE Sensitive     GENTAMICIN <=1 SENSITIVE Sensitive     IMIPENEM 2 SENSITIVE Sensitive     TRIMETH/SULFA <=20 SENSITIVE Sensitive     AMPICILLIN/SULBACTAM <=2 SENSITIVE Sensitive     PIP/TAZO <=4 SENSITIVE Sensitive ug/mL    * FEW PROTEUS MIRABILIS         Radiology Studies: No results found.    Scheduled Meds:  acetaminophen   1,000 mg Oral Q6H   Chlorhexidine  Gluconate Cloth  6 each Topical Daily   feeding supplement  237 mL Oral TID BM   heparin  injection (subcutaneous)  5,000 Units Subcutaneous Q8H   insulin  aspart  0-15 Units Subcutaneous Q6H   metoprolol  tartrate  5 mg Intravenous Q4H   pantoprazole  (PROTONIX ) IV  40 mg Intravenous Q12H   sodium chloride  flush  10-40 mL Intracatheter Q12H   Continuous Infusions:  amiodarone  30 mg/hr (08/25/23 1116)   lactated ringers  50 mL/hr at 08/25/23 1110   piperacillin -tazobactam (ZOSYN )  IV 3.375 g (08/25/23 1323)   TPN ADULT (ION) 80 mL/hr at 08/25/23 1110     LOS: 18 days    Time spent: 55 minutes    Dorothy Harris D Mason Sole, DO [facility]  If 7PM-7AM, please contact night-coverage [email or web address] 08/25/2023, 1:49 PM

## 2023-08-25 NOTE — Progress Notes (Signed)
 Rounding Note    Patient Name: Dorothy Harris Date of Encounter: 08/25/2023  [facility] Cardiologist: Gaylyn Keas, MD   Subjective   Pt denies CP  Breathing is OK in chair   Inpatient Medications    Scheduled Meds:  acetaminophen   1,000 mg Oral Q6H   amiodarone   200 mg Oral BID   Chlorhexidine  Gluconate Cloth  6 each Topical Daily   feeding supplement  237 mL Oral TID BM   heparin  injection (subcutaneous)  5,000 Units Subcutaneous Q8H   insulin  aspart  0-15 Units Subcutaneous Q6H   metoprolol  tartrate  5 mg Intravenous Q4H   pantoprazole  (PROTONIX ) IV  40 mg Intravenous Q12H   sodium chloride  flush  10-40 mL Intracatheter Q12H   Continuous Infusions:  lactated ringers  50 mL/hr at 08/25/23 1110   piperacillin -tazobactam (ZOSYN )  IV 3.375 g (08/25/23 1323)   TPN ADULT (ION) 80 mL/hr at 08/25/23 1110   PRN Meds: hydrALAZINE , HYDROmorphone  (DILAUDID ) injection, melatonin, oxyCODONE , phenol, prochlorperazine , sodium chloride  flush   Vital Signs    Vitals:   08/25/23 1301 08/25/23 1302 08/25/23 1303 08/25/23 1304  BP:      Pulse:      Resp: 16 17 18 17   Temp:      TempSrc:      SpO2: 96% 95% 96%   Weight:      Height:        Intake/Output Summary (Last 24 hours) at 08/25/2023 1438 Last data filed at 08/25/2023 1255 Gross per 24 hour  Intake 3547.04 ml  Output 3300 ml  Net 247.04 ml      08/25/2023    7:00 AM 08/24/2023    5:00 AM 08/23/2023    5:00 AM  Last 3 Weights  Weight (lbs) 235 lb 10.8 oz 233 lb 0.4 oz 235 lb 10.8 oz  Weight (kg) 106.9 kg 105.7 kg 106.9 kg      Telemetry    Atrial fibrillation, flutter  Rates 80s to 120s  - Personally Reviewed  ECG    No new  - Personally Reviewed  Physical Exam   GEN: No acute distress.   Neck: Neck is full  Cardiac: Irreg irreg   No S3  No murmurs  Respiratory: Clear anteriorly   GI: Distended   Decreased BS  MS: 1+ LE  edema;   Labs    High Sensitivity Troponin:   Recent Labs  Lab  08/06/23 2148 08/06/23 2312 08/10/23 0513 08/10/23 0838  TROPONINIHS 93* 86* 214* 206*     Chemistry Recent Labs  Lab 08/20/23 0419 08/20/23 0420 08/23/23 0549 08/24/23 0405 08/25/23 0445  NA 142   < > 137 136 137  K 4.8   < > 3.7 3.9 4.1  CL 112*   < > 104 105 105  CO2 20*   < > 24 24 27   GLUCOSE 154*   < > 136* 145* 138*  BUN 77*   < > 95* 84* 77*  CREATININE 2.14*   < > 3.14* 2.68* 2.38*  CALCIUM  8.5*   < > 7.9* 7.8* 8.0*  MG 2.3   < > 2.1 2.0 2.0  PROT 5.4*  --   --  5.0*  --   ALBUMIN  1.9*   < > 1.5* <1.5* <1.5*  AST 41  --   --  10*  --   ALT 68*  --   --  16  --   ALKPHOS 160*  --   --  91  --  BILITOT 0.4  --   --  0.4  --   GFRNONAA 26*   < > 16* 20* 23*  ANIONGAP 10   < > 9 7 5    < > = values in this interval not displayed.    Lipids  Recent Labs  Lab 08/24/23 0405  TRIG 127    Hematology Recent Labs  Lab 08/23/23 0549 08/24/23 0405 08/25/23 0445  WBC 15.0* 12.3* 12.0*  RBC 2.75* 2.49* 2.59*  HGB 8.4* 8.0* 7.9*  HCT 26.3* 23.9* 25.1*  MCV 95.6 96.0 96.9  MCH 30.5 32.1 30.5  MCHC 31.9 33.5 31.5  RDW 18.0* 17.6* 17.2*  PLT 297 298 295   Thyroid No results for input(s): "TSH", "FREET4" in the last 168 hours.  BNPNo results for input(s): "BNP", "PROBNP" in the last 168 hours.  DDimer No results for input(s): "DDIMER" in the last 168 hours.   Radiology    No results found.  Cardiac Studies   Echo  may 2025   1. Left ventricular ejection fraction, by estimation, is 60 to 65%. The  left ventricle has normal function. The left ventricle has no regional  wall motion abnormalities. There is severe asymmetric left ventricular  hypertrophy of the septal segment. Left   ventricular diastolic function could not be evaluated.   2. Right ventricular systolic function is normal. The right ventricular  size is normal. There is normal pulmonary artery systolic pressure.   3. Left atrial size was severely dilated.   4. The mitral valve is normal in  structure. Trivial mitral valve  regurgitation. No evidence of mitral stenosis.   5. The aortic valve is tricuspid. There is moderate calcification of the  aortic valve. Aortic valve regurgitation is not visualized. No aortic  stenosis is present.   6. The inferior vena cava is normal in size with greater than 50%  respiratory variability, suggesting right atrial pressure of 3 mmHg.    CTA Chest/Abdomen/Pelvis  10/02/2022: IMPRESSION: 1. Post ascending aorta graft repair, with reimplantation of brachiocephalic vessels, and stent grafting of the arch and descending thoracic aorta. 2. Continued type 2 endoleak into the proximal descending thoracic aneurysm which measures up to 5.7 cm transverse diameter (previously 5.6). 3. 5.2 cm infrarenal abdominal aortic aneurysm (previously 5.1). 4. 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   CT Chest/Abdomen/Pelvis 04/08/2021: IMPRESSION: CT chest:   1. Interval postsurgical changes from ascending thoracic aortic dissection repair. Evaluation is extremely limited given the lack of intravenous contrast. 2. 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 Again, evaluation is extremely limited given the lack of intravenous contrast. 3. Trace bilateral pleural effusions with dependent bilateral lower lobe atelectasis. 4. Marked edema within the left lateral chest wall and left breast. 5.  Aortic Atherosclerosis (ICD10-I70.0).   Left Heart Cath 12/24/2015: Mid LAD lesion, 15 %stenosed. Ost 1st Diag to 1st Diag lesion, 20 %stenosed. There is hyperdynamic left ventricular systolic function. LV end diastolic pressure is normal. The left ventricular ejection fraction is greater than 65% by visual estimate.   Assessment & Plan    1  Atrial fibrillation/flutter   Pt remains in afib /flutter   Rates faster ths PM  Now in 120s    I would recomm bolusing with IV  amiodarone  and increasing rate to 60 mg /hour Continue on heparin  With rates being high, I would not transition to PO now, espeically since just taking PO   2.  PAD  Pt with Type A aortic dissection, s/p repair ascending aorta; s/p endovascular repair distal aortic arch  Pt with Type II endoleak into prox descendin aortic aneurysm (5.7 cm) and enfrarenal AAA (5.2 cm)  3  Renal  BUN/Cr 77/2.38  Imprved over past couple days   Follow  May need to Rx with lasix  here and there   For questions or updates, please contact [facility] Please consult [email or web address] for contact info under        Signed, Ola Berger, MD  08/25/2023, 2:38 PM

## 2023-08-26 DIAGNOSIS — I4891 Unspecified atrial fibrillation: Secondary | ICD-10-CM | POA: Diagnosis not present

## 2023-08-26 DIAGNOSIS — K56609 Unspecified intestinal obstruction, unspecified as to partial versus complete obstruction: Secondary | ICD-10-CM | POA: Diagnosis not present

## 2023-08-26 DIAGNOSIS — N179 Acute kidney failure, unspecified: Secondary | ICD-10-CM | POA: Diagnosis not present

## 2023-08-26 DIAGNOSIS — I5032 Chronic diastolic (congestive) heart failure: Secondary | ICD-10-CM | POA: Diagnosis not present

## 2023-08-26 LAB — GLUCOSE, CAPILLARY
Glucose-Capillary: 123 mg/dL — ABNORMAL HIGH (ref 70–99)
Glucose-Capillary: 109 mg/dL — ABNORMAL HIGH (ref 70–99)
Glucose-Capillary: 169 mg/dL — ABNORMAL HIGH (ref 70–99)
Glucose-Capillary: 115 mg/dL — ABNORMAL HIGH (ref 70–99)
Glucose-Capillary: 113 mg/dL — ABNORMAL HIGH (ref 70–99)
Glucose-Capillary: 149 mg/dL — ABNORMAL HIGH (ref 70–99)
Glucose-Capillary: 122 mg/dL — ABNORMAL HIGH (ref 70–99)

## 2023-08-26 LAB — BASIC METABOLIC PANEL WITH GFR
Anion gap: 8 (ref 5–15)
BUN: 67 mg/dL — ABNORMAL HIGH (ref 8–23)
CO2: 27 mmol/L (ref 22–32)
Calcium: 8.2 mg/dL — ABNORMAL LOW (ref 8.9–10.3)
Chloride: 102 mmol/L (ref 98–111)
Creatinine, Ser: 2.27 mg/dL — ABNORMAL HIGH (ref 0.44–1.00)
GFR, Estimated: 24 mL/min — ABNORMAL LOW (ref >60)
Glucose, Bld: 121 mg/dL — ABNORMAL HIGH (ref 70–99)
Potassium: 4.6 mmol/L (ref 3.5–5.1)
Sodium: 136 mmol/L (ref 135–145)

## 2023-08-26 LAB — CBC
HCT: 24.5 % — ABNORMAL LOW (ref 36.0–46.0)
Hemoglobin: 8.2 g/dL — ABNORMAL LOW (ref 12.0–15.0)
MCH: 32.2 pg (ref 26.0–34.0)
MCHC: 33.5 g/dL (ref 30.0–36.0)
MCV: 96.1 fL (ref 80.0–100.0)
Platelets: 303 10*3/uL (ref 150–400)
RBC: 2.55 MIL/uL — ABNORMAL LOW (ref 3.87–5.11)
RDW: 17 % — ABNORMAL HIGH (ref 11.5–15.5)
WBC: 11.4 10*3/uL — ABNORMAL HIGH (ref 4.0–10.5)
nRBC: 0 % (ref 0.0–0.2)

## 2023-08-26 LAB — HEPARIN LEVEL (UNFRACTIONATED): Heparin Unfractionated: 0.1 [IU]/mL — ABNORMAL LOW (ref 0.30–0.70)

## 2023-08-26 LAB — ALBUMIN: Albumin: 1.6 g/dL — ABNORMAL LOW (ref 3.5–5.0)

## 2023-08-26 LAB — MAGNESIUM: Magnesium: 2.1 mg/dL (ref 1.7–2.4)

## 2023-08-26 LAB — PHOSPHORUS: Phosphorus: 2.7 mg/dL (ref 2.5–4.6)

## 2023-08-26 MED ORDER — HEPARIN BOLUS VIA INFUSION
2000.0000 [IU] | Freq: Once | INTRAVENOUS | Status: AC
  Administered 2023-08-26: 2000 [IU] via INTRAVENOUS
  Filled 2023-08-26: qty 2000

## 2023-08-26 MED ORDER — HEPARIN (PORCINE) 25000 UT/250ML-% IV SOLN
1450.0000 [IU]/h | INTRAVENOUS | Status: DC
  Administered 2023-08-26: 1200 [IU]/h via INTRAVENOUS
  Administered 2023-08-27 (×2): 1450 [IU]/h via INTRAVENOUS
  Filled 2023-08-26 (×4): qty 250

## 2023-08-26 MED ORDER — HEPARIN BOLUS VIA INFUSION
2550.0000 [IU] | Freq: Once | INTRAVENOUS | Status: AC
  Administered 2023-08-26: 2550 [IU] via INTRAVENOUS
  Filled 2023-08-26: qty 2550

## 2023-08-26 MED ORDER — INSULIN ASPART 100 UNIT/ML IJ SOLN
0.0000 [IU] | Freq: Three times a day (TID) | INTRAMUSCULAR | Status: DC
  Administered 2023-08-26 – 2023-08-28 (×4): 2 [IU] via SUBCUTANEOUS

## 2023-08-26 MED ORDER — ATORVASTATIN CALCIUM 40 MG PO TABS
80.0000 mg | ORAL_TABLET | Freq: Every evening | ORAL | Status: DC
Start: 2023-08-26 — End: 2023-08-30
  Administered 2023-08-26 – 2023-08-29 (×4): 80 mg via ORAL
  Filled 2023-08-26 (×4): qty 2

## 2023-08-26 NOTE — Plan of Care (Signed)
  Problem: Clinical Measurements: Goal: Ability to maintain clinical measurements within normal limits will improve Outcome: Progressing Goal: Will remain free from infection Outcome: Progressing Goal: Diagnostic test results will improve Outcome: Progressing Goal: Respiratory complications will improve Outcome: Progressing Goal: Cardiovascular complication will be avoided Outcome: Progressing   Problem: Elimination: Goal: Will not experience complications related to bowel motility Outcome: Progressing Goal: Will not experience complications related to urinary retention Outcome: Progressing   Problem: Pain Managment: Goal: General experience of comfort will improve and/or be controlled Outcome: Progressing   Problem: Safety: Goal: Ability to remain free from injury will improve Outcome: Progressing   Problem: Skin Integrity: Goal: Risk for impaired skin integrity will decrease Outcome: Progressing

## 2023-08-26 NOTE — Progress Notes (Signed)
 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 EMS was activated and arrival of EMS team, patient was hypotensive with SBP in 70s, IV NS 250 mL bolus was given with SBP improving to 100's In ZH:[id number] stable. Labs-normal CBC, BMP showed AKI bicarb 19,Troponin 93 > 86. CT abdomen and pelvis>>high-grade small bowel obstruction with transition point likely somewhere in the distal third of the ileum.  Patient was given antibiotics IV x1 and placed on fluids, NG tube placement w/ drainage of 650 mL of greenish fluid and admitted.      Assessment and Plan:  High-Grade SBO status post exploratory lap with lysis of adhesions and resection of small bowel with end ileostomy revision 5/15 - Appreciate ongoing general surgery management  - Advance to soft diet,  TPN completed  - completed IV antibiotics course  - Continue pain management as needed while recovering  - Antiemetics as needed  - Currently on soft diet, NG tube has been removed   AKI ---acute kidney injury on CKD stage IV -due to dehydration/volume depletion in a patient with SBO and n.p.o. status with high output from NG tube-this has resolved - Appreciate nephrology involvement with plans to discontinue further IV fluid on 08/26/23. - Nephrology has signed off - creatinine back to baseline     Acute anemia likely postoperative equilibration-improved - S/p 2 unit PRBC transfusion - Continue to monitor CBC - No overt bleeding  noted    Essential hypertension: - c/n to Hold PTA amlodipine  and oral hydralazine   - Continue to follow   Paroxysmal A-fib: -PTA amiodarone  and Coreg  on hold due to n.p.o. status -Patient was not on anticoagulation PTA - Echo 60-65% - Continue IV amiodarone  drip; intermittent boluses will be provided to assist with rate control - Plan to convert to oral amiodarone  once tolerating diet reliably and consistently - discussed with cardiology, ok to start IV heparin  infusion 5/21, given stable H/H testing    HFpEF with LVEF 60-65% -completed IV fluid course -continue to follow I's and O's   HLD - Continue home dose atorvastatin    acute urinary retention - Continue Foley catheter until patient more ambulatory   obesity, class II - BMI 35.81   DVT prophylaxis: IV heparin  infusion  Code Status: Full Family Communication:  Disposition: anticipating SNF placement    Consultants:  Surgery  Cardiology  Procedures:   Antimicrobials:    Subjective: Pt reporting that she is tolerating sips of fluids and tolerating diet so far no specific complaints.    Objective: Vitals:   08/26/23 0800 08/26/23 0900 08/26/23 1000 08/26/23 1202  BP: 101/65 109/70 (!) 117/53   Pulse: 83 (!) 111 79   Resp: 14 14 18    Temp:    98.4 F (36.9 C)  TempSrc:    Axillary  SpO2: 95% 94% 92%   Weight:      Height:        Intake/Output Summary (Last 24 hours) at 08/26/2023 1412 Last data filed at 08/26/2023  0500 Gross per 24 hour  Intake 1288.57 ml  Output 1850 ml  Net -561.43 ml   Filed Weights   08/24/23 0500 08/25/23 0700 08/26/23 0400  Weight: 105.7 kg 106.9 kg 110.9 kg   Examination:  General exam: Appears calm and comfortable  Respiratory system: Clear to auscultation. Respiratory effort normal. Cardiovascular system: normal S1 & S2 heard. No JVD, murmurs, rubs, gallops or clicks. No pedal edema. Gastrointestinal system: Abdomen is nondistended, soft and nontender. No organomegaly or  masses felt. Normal bowel sounds heard. Central nervous system: Alert and oriented. No focal neurological deficits. Extremities: Symmetric 5 x 5 power. Skin: No rashes, lesions or ulcers. Psychiatry: Judgement and insight appear normal. Mood & affect appropriate.   Data Reviewed: I have personally reviewed following labs and imaging studies  CBC: Recent Labs  Lab 08/20/23 0419 08/21/23 0436 08/22/23 0508 08/22/23 1811 08/23/23 0549 08/24/23 0405 08/25/23 0445 08/26/23 0335  WBC 10.8* 21.1* 16.8*  --  15.0* 12.3* 12.0* 11.4*  NEUTROABS 8.4* 17.4*  --   --   --   --   --   --   HGB 8.7* 8.8* 7.0* 8.7* 8.4* 8.0* 7.9* 8.2*  HCT 27.8* 28.8* 23.3* 26.8* 26.3* 23.9* 25.1* 24.5*  MCV 101.5* 104.3* 102.6*  --  95.6 96.0 96.9 96.1  PLT 300 348 283  --  297 298 295 303    Basic Metabolic Panel: Recent Labs  Lab 08/22/23 0508 08/23/23 0549 08/24/23 0405 08/25/23 0445 08/26/23 0335  NA 135 137 136 137 136  K 4.1 3.7 3.9 4.1 4.6  CL 105 104 105 105 102  CO2 19* 24 24 27 27   GLUCOSE 209* 136* 145* 138* 121*  BUN 100* 95* 84* 77* 67*  CREATININE 3.16* 3.14* 2.68* 2.38* 2.27*  CALCIUM  7.7* 7.9* 7.8* 8.0* 8.2*  MG 2.0 2.1 2.0 2.0 2.1  PHOS 5.4* 4.9* 3.2 2.5 2.7    CBG: Recent Labs  Lab 08/25/23 1800 08/25/23 2323 08/26/23 0624 08/26/23 0757 08/26/23 1148  GLUCAP 150* 108* 123* 109* 115*    Recent Results (from the past 240 hours)  Aerobic/Anaerobic Culture w Gram Stain (surgical/deep wound)     Status: None   Collection Time: 08/20/23 10:11 AM   Specimen: Abdomen; Abscess  Result Value Ref Range Status   Specimen Description   Final    WOUND Performed at [facility], [address] [place], [place] [address]    Special Requests   Final    NONE Performed at [facility], [address] [place], [place] [address]    Gram Stain   Final    FEW WBC PRESENT, PREDOMINANTLY PMN MODERATE GRAM NEGATIVE RODS FEW GRAM POSITIVE COCCI IN PAIRS RARE GRAM POSITIVE  RODS Performed at [facility], [address], [place], [place] [address]    Culture   Final    FEW PROTEUS MIRABILIS FEW ESCHERICHIA COLI FEW ENTEROCOCCUS FAECALIS MIXED ANAEROBIC FLORA PRESENT.  CALL LAB IF FURTHER IID REQUIRED.    Report Status 08/25/2023 FINAL  Final   Organism ID, Bacteria PROTEUS MIRABILIS  Final   Organism ID, Bacteria ESCHERICHIA COLI  Final   Organism ID, Bacteria ENTEROCOCCUS FAECALIS  Final      Susceptibility   Escherichia coli - MIC*    AMPICILLIN 8 SENSITIVE Sensitive     CEFEPIME  <=0.12 SENSITIVE Sensitive     CEFTAZIDIME <=1 SENSITIVE Sensitive     CEFTRIAXONE  <=0.25 SENSITIVE Sensitive     CIPROFLOXACIN <=0.25 SENSITIVE Sensitive  GENTAMICIN <=1 SENSITIVE Sensitive     IMIPENEM <=0.25 SENSITIVE Sensitive     TRIMETH/SULFA <=20 SENSITIVE Sensitive     AMPICILLIN/SULBACTAM 4 SENSITIVE Sensitive     PIP/TAZO <=4 SENSITIVE Sensitive ug/mL    * FEW ESCHERICHIA COLI   Enterococcus faecalis - MIC*    AMPICILLIN <=2 SENSITIVE Sensitive     VANCOMYCIN  1 SENSITIVE Sensitive     GENTAMICIN SYNERGY SENSITIVE Sensitive     * FEW ENTEROCOCCUS FAECALIS   Proteus mirabilis - MIC*    AMPICILLIN <=2 SENSITIVE Sensitive     CEFEPIME  <=0.12 SENSITIVE Sensitive     CEFTAZIDIME <=1 SENSITIVE Sensitive     CEFTRIAXONE  <=0.25 SENSITIVE Sensitive     CIPROFLOXACIN <=0.25 SENSITIVE Sensitive     GENTAMICIN <=1 SENSITIVE Sensitive     IMIPENEM 2 SENSITIVE Sensitive     TRIMETH/SULFA <=20 SENSITIVE Sensitive     AMPICILLIN/SULBACTAM <=2 SENSITIVE Sensitive     PIP/TAZO <=4 SENSITIVE Sensitive ug/mL    * FEW PROTEUS MIRABILIS     Radiology Studies: No results found.  Scheduled Meds:  acetaminophen   1,000 mg Oral Q6H   Chlorhexidine  Gluconate Cloth  6 each Topical Daily   feeding supplement  237 mL Oral TID BM   insulin  aspart  0-15 Units Subcutaneous TID WC & HS   metoprolol  tartrate  5 mg Intravenous Q4H   pantoprazole  (PROTONIX ) IV  40 mg  Intravenous Q12H   sodium chloride  flush  10-40 mL Intracatheter Q12H   Continuous Infusions:  amiodarone  30 mg/hr (08/26/23 0947)   heparin  1,200 Units/hr (08/26/23 1100)     LOS: 19 days   Time spent: 55 mins  Arushi Partridge Lincoln Renshaw, MD How to contact the [facility] Attending or Consulting provider 7A - 7P or covering provider during after hours 7P -7A, for this patient?  Check the care team in [facility] and look for a) attending/consulting TRH provider listed and b) the TRH team listed Log into [email or web address] to find provider on call.  Locate the TRH provider you are looking for under [facility] and page to a number that you can be directly reached. If you still have difficulty reaching the provider, please page the [facility] (Director on Call) for the Hospitalists listed on amion for assistance.  08/26/2023, 2:12 PM

## 2023-08-26 NOTE — Progress Notes (Signed)
 PHARMACY - ANTICOAGULATION CONSULT NOTE  Pharmacy Consult for heparin  Indication: atrial fibrillation  Allergies  Allergen Reactions   Chlorthalidone  Other (See Comments)    ELEVATED KIDNEY FUNCTION     Patient Measurements: Height: 5\' 7"  (170.2 cm) Weight: 110.9 kg (244 lb 7.8 oz) IBW/kg (Calculated) : 61.6 HEPARIN  DW (KG): 85  Vital Signs: Temp: 98.1 F (36.7 C) (05/21 1707) Temp Source: Oral (05/21 1707) BP: 119/78 (05/21 1600) Pulse Rate: 112 (05/21 1600)  Labs: Recent Labs    08/24/23 0405 08/25/23 0445 08/26/23 0335 08/26/23 1833  HGB 8.0* 7.9* 8.2*  --   HCT 23.9* 25.1* 24.5*  --   PLT 298 295 303  --   HEPARINUNFRC  --   --   --  0.10*  CREATININE 2.68* 2.38* 2.27*  --     Estimated Creatinine Clearance: 33 mL/min (A) (by C-G formula based on SCr of 2.27 mg/dL (H)).   Medical History: 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HCC)    Anemia    Chronic kidney disease    Essential hypertension 05/07/2021   GERD (gastroesophageal reflux disease)    Hernia, epigastric    Hypertension    PAF (paroxysmal atrial fibrillation) (HCC) 05/07/2021   Pure hypercholesterolemia 05/07/2021    Medications:  Medications Prior to Admission  Medication Sig Dispense Refill Last Dose/Taking   acetaminophen  (TYLENOL ) 325 MG tablet Take 1-2 tablets (325-650 mg total) by mouth every 4 (four) hours as needed for mild pain.   08/06/2023   amLODipine  (NORVASC ) 10 MG tablet Take 1 tablet (10 mg total) by mouth daily. 90 tablet 3 08/06/2023   aspirin  81 MG chewable tablet Chew 4 tablets (324 mg total) by mouth daily.   08/06/2023   atorvastatin  (LIPITOR ) 80 MG tablet Take 1 tablet (80 mg total) by mouth daily. 90 tablet 3 08/06/2023   carvedilol  (COREG ) 12.5 MG tablet Take 1 tablet (12.5 mg total) by mouth 2 (two) times daily with a meal. 180 tablet 3 08/06/2023   cyclobenzaprine  (FLEXERIL ) 5 MG tablet Take 1 tablet (5 mg total) by mouth as needed for up to 30 doses  for muscle spasms. 30 tablet 0 Past Month   ferrous sulfate  325 (65 FE) MG tablet Take 1 tablet (325 mg total) by mouth daily with breakfast. 30 tablet 0 08/06/2023   hydrALAZINE  (APRESOLINE ) 25 MG tablet Take 1 tablet (25 mg total) by mouth in the morning and at bedtime. 180 tablet 3 08/06/2023   Multiple Vitamin (MULTIVITAMIN WITH MINERALS) TABS tablet Take 1 tablet by mouth daily.   08/06/2023    Assessment: Pharmacy consulted to dose heparin  in patient with new onset atrial fibrillation.  She has been on heparin  prophylaxis while inpatient with last dose 5/21 0514.  Hgb 8.2  Date Time HL Rate/Comment 05/21 1833 0.10 Subtherapeutic on 1200 u/hr  Goal of Therapy:  Heparin  level 0.3-0.7 units/ml Monitor platelets by anticoagulation protocol: Yes   Plan:  Heparin  level is subtherapeutic, no issues with infusion noted Give heparin  bolus 2550 units x1 Increase heparin  infusion to 1450 units/hour Check heparin  level 6 hours after rate change Monitor daily heparin  levels while on heparin  infusion Monitor CBC and signs/symptoms of bleeding  Thank you for involving pharmacy in this patient's care.   Ananias Balls, PharmD Clinical Pharmacist 08/26/2023 7:03 PM

## 2023-08-26 NOTE — Plan of Care (Signed)

## 2023-08-26 NOTE — Progress Notes (Addendum)
 Patient admitted with SBO, now s/p exploratory lab with lysis of adhesions and resection. Issues with new onset afib with RVR this admission. Soft bp's initially, she was started on IV amidarone. Due to extended NPO status had been maintained on amio gtt. Recurrent high rates yesterday, amio was rebolused and drip increased to 60mg /hr. Tele shows afib 80s to 110s. Also on IV lopressor  5mg  every 4 hrs. See if can lower amio gtt back to 30mg /hr. From surgery note if H&H stable today ok to start anticoag. Hgb around 8 last 3 days, will try heparin  initially and then transition to DOAC if tolerates if ok with primary team.  Her CHADS2Vasc score is 2 (HTN, gender), in a female patient would only be a IIBAindication to consider anticoagulation, however since on amio with potential chemical conversion would favor starting.      Armida Lander MD

## 2023-08-26 NOTE — Progress Notes (Addendum)
 [facility]  Eating. No nausea reported. Sat in chair for over 1 hour.  BP (!) 117/53   Pulse 79   Temp 98.4 F (36.9 C) (Axillary)   Resp 18   Ht 5\' 7"  (1.702 m)   Wt 110.9 kg   SpO2 92%   BMI 38.29 kg/m  Midline with no further bleeding, old blood on the ABD. Ostomy pink with contents in bag and gas. Staples dry and intact   Patient s/p Ex lap, LOA, end ileostomy revision, SBR.  PRN for pain Soft diet Antibiotics until POD 5, sensitive to zosyn   Ostomy Care  Getting closer to dc, maybe next 48 hours H&H stable can start her anticoagulation for her cardiac issues   Future Appointments  Date Time Provider Department Center  09/08/2023 11:45 AM Awilda Bogus, MD [facility] None     Deena Farrier, MD [facility] [address] [place], [place] [address] [phone] (office)

## 2023-08-26 NOTE — TOC Progression Note (Signed)
 Transition of Care [facility]) - Progression Note    Patient Details  Name: Dorothy Harris MRN: [id number] Date of Birth: 1960-12-07  Transition of [facility]) CM/SW Contact  Grandville Lax, [place] Phone Number: 08/26/2023, 12:53 PM  Clinical Narrative:    Per general surgery note from 5/20 pt could be ready for D/C in next couple days. CSW reached out to Bambi Lever with [facility] admissions to request that SNF insurance auth be started today. TOC to follow.  Expected Discharge Plan: Home/Self Care Barriers to Discharge: Continued Medical Work up  Expected Discharge Plan and Services In-house Referral: Clinical Social Work Discharge Planning Services: CM Consult   Living arrangements for the past 2 months: Single Family Home                                       Social Determinants of Health (SDOH) Interventions SDOH Screenings   Food Insecurity: Food Insecurity Present (08/07/2023)  Housing: High Risk (08/07/2023)  Transportation Needs: No Transportation Needs (08/07/2023)  Utilities: Not At Risk (08/07/2023)  Alcohol Screen: Low Risk  (05/07/2021)  Depression (PHQ2-9): Medium Risk (05/05/2023)  Financial Resource Strain: Low Risk  (05/07/2021)  Physical Activity: Inactive (05/07/2021)  Tobacco Use: High Risk (08/20/2023)    Readmission Risk Interventions    08/11/2023    8:27 AM 08/07/2023    7:29 AM 01/22/2022   12:57 PM  Readmission Risk Prevention Plan  Medication Screening  Complete   Transportation Screening Complete Complete Complete  PCP or Specialist Appt within 3-5 Days   Complete  HRI or Home Care Consult Complete  Complete  Social Work Consult for Recovery Care Planning/Counseling Complete  Complete  Palliative Care Screening Not Applicable  Not Applicable  Medication Review Oceanographer) Complete  Referral to Pharmacy

## 2023-08-26 NOTE — Progress Notes (Signed)
 PHARMACY - ANTICOAGULATION CONSULT NOTE  Pharmacy Consult for heparin  Indication: atrial fibrillation  Allergies  Allergen Reactions   Chlorthalidone  Other (See Comments)    ELEVATED KIDNEY FUNCTION     Patient Measurements: Height: 5\' 7"  (170.2 cm) Weight: 110.9 kg (244 lb 7.8 oz) IBW/kg (Calculated) : 61.6 HEPARIN  DW (KG): 85  Vital Signs: Temp: 98.3 F (36.8 C) (05/21 0730) Temp Source: Oral (05/21 0730) BP: 110/74 (05/21 0500) Pulse Rate: 112 (05/21 0500)  Labs: Recent Labs    08/24/23 0405 08/25/23 0445 08/26/23 0335  HGB 8.0* 7.9* 8.2*  HCT 23.9* 25.1* 24.5*  PLT 298 295 303  CREATININE 2.68* 2.38* 2.27*    Estimated Creatinine Clearance: 33 mL/min (A) (by C-G formula based on SCr of 2.27 mg/dL (H)).   Medical History: 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HCC)    Anemia    Chronic kidney disease    Essential hypertension 05/07/2021   GERD (gastroesophageal reflux disease)    Hernia, epigastric    Hypertension    PAF (paroxysmal atrial fibrillation) (HCC) 05/07/2021   Pure hypercholesterolemia 05/07/2021    Medications:  Medications Prior to Admission  Medication Sig Dispense Refill Last Dose/Taking   acetaminophen  (TYLENOL ) 325 MG tablet Take 1-2 tablets (325-650 mg total) by mouth every 4 (four) hours as needed for mild pain.   08/06/2023   amLODipine  (NORVASC ) 10 MG tablet Take 1 tablet (10 mg total) by mouth daily. 90 tablet 3 08/06/2023   aspirin  81 MG chewable tablet Chew 4 tablets (324 mg total) by mouth daily.   08/06/2023   atorvastatin  (LIPITOR ) 80 MG tablet Take 1 tablet (80 mg total) by mouth daily. 90 tablet 3 08/06/2023   carvedilol  (COREG ) 12.5 MG tablet Take 1 tablet (12.5 mg total) by mouth 2 (two) times daily with a meal. 180 tablet 3 08/06/2023   cyclobenzaprine  (FLEXERIL ) 5 MG tablet Take 1 tablet (5 mg total) by mouth as needed for up to 30 doses for muscle spasms. 30 tablet 0 Past Month   ferrous sulfate  325 (65 FE)  MG tablet Take 1 tablet (325 mg total) by mouth daily with breakfast. 30 tablet 0 08/06/2023   hydrALAZINE  (APRESOLINE ) 25 MG tablet Take 1 tablet (25 mg total) by mouth in the morning and at bedtime. 180 tablet 3 08/06/2023   Multiple Vitamin (MULTIVITAMIN WITH MINERALS) TABS tablet Take 1 tablet by mouth daily.   08/06/2023    Assessment: Pharmacy consulted to dose heparin  in patient with new onset atrial fibrillation.  She has been on heparin  prophylaxis while inpatient with last dose 5/21 0514.  Hgb 8.2   Goal of Therapy:  Heparin  level 0.3-0.7 units/ml Monitor platelets by anticoagulation protocol: Yes   Plan:  Give 2000 units bolus x 1 Start heparin  infusion at 1200 units/hr Check anti-Xa level in 6-8 hours and daily while on heparin  Continue to monitor H&H and platelets  Cliffton Dama, PharmD Clinical Pharmacist 08/26/2023 9:22 AM

## 2023-08-27 DIAGNOSIS — I4819 Other persistent atrial fibrillation: Secondary | ICD-10-CM | POA: Diagnosis present

## 2023-08-27 DIAGNOSIS — N179 Acute kidney failure, unspecified: Secondary | ICD-10-CM | POA: Diagnosis not present

## 2023-08-27 DIAGNOSIS — K56609 Unspecified intestinal obstruction, unspecified as to partial versus complete obstruction: Secondary | ICD-10-CM | POA: Diagnosis not present

## 2023-08-27 DIAGNOSIS — I5032 Chronic diastolic (congestive) heart failure: Secondary | ICD-10-CM | POA: Diagnosis not present

## 2023-08-27 DIAGNOSIS — I10 Essential (primary) hypertension: Secondary | ICD-10-CM | POA: Diagnosis not present

## 2023-08-27 LAB — PHOSPHORUS: Phosphorus: 3.5 mg/dL (ref 2.5–4.6)

## 2023-08-27 LAB — GLUCOSE, CAPILLARY
Glucose-Capillary: 107 mg/dL — ABNORMAL HIGH (ref 70–99)
Glucose-Capillary: 137 mg/dL — ABNORMAL HIGH (ref 70–99)
Glucose-Capillary: 119 mg/dL — ABNORMAL HIGH (ref 70–99)
Glucose-Capillary: 108 mg/dL — ABNORMAL HIGH (ref 70–99)

## 2023-08-27 LAB — COMPREHENSIVE METABOLIC PANEL WITH GFR
ALT: 15 U/L (ref 0–44)
AST: 12 U/L — ABNORMAL LOW (ref 15–41)
Albumin: 1.7 g/dL — ABNORMAL LOW (ref 3.5–5.0)
Alkaline Phosphatase: 124 U/L (ref 38–126)
Anion gap: 6 (ref 5–15)
BUN: 58 mg/dL — ABNORMAL HIGH (ref 8–23)
CO2: 25 mmol/L (ref 22–32)
Calcium: 8.1 mg/dL — ABNORMAL LOW (ref 8.9–10.3)
Chloride: 103 mmol/L (ref 98–111)
Creatinine, Ser: 2.15 mg/dL — ABNORMAL HIGH (ref 0.44–1.00)
GFR, Estimated: 25 mL/min — ABNORMAL LOW (ref >60)
Glucose, Bld: 93 mg/dL (ref 70–99)
Potassium: 4.4 mmol/L (ref 3.5–5.1)
Sodium: 134 mmol/L — ABNORMAL LOW (ref 135–145)
Total Bilirubin: 0.7 mg/dL (ref 0.0–1.2)
Total Protein: 6.1 g/dL — ABNORMAL LOW (ref 6.5–8.1)

## 2023-08-27 LAB — HEPARIN LEVEL (UNFRACTIONATED)
Heparin Unfractionated: 0.58 [IU]/mL (ref 0.30–0.70)
Heparin Unfractionated: 0.48 [IU]/mL (ref 0.30–0.70)

## 2023-08-27 LAB — CBC
HCT: 26.3 % — ABNORMAL LOW (ref 36.0–46.0)
Hemoglobin: 8.6 g/dL — ABNORMAL LOW (ref 12.0–15.0)
MCH: 31.7 pg (ref 26.0–34.0)
MCHC: 32.7 g/dL (ref 30.0–36.0)
MCV: 97 fL (ref 80.0–100.0)
Platelets: 312 10*3/uL (ref 150–400)
RBC: 2.71 MIL/uL — ABNORMAL LOW (ref 3.87–5.11)
RDW: 16.9 % — ABNORMAL HIGH (ref 11.5–15.5)
WBC: 11.3 10*3/uL — ABNORMAL HIGH (ref 4.0–10.5)
nRBC: 0.4 % — ABNORMAL HIGH (ref 0.0–0.2)

## 2023-08-27 LAB — PREPARE RBC (CROSSMATCH)

## 2023-08-27 LAB — MAGNESIUM: Magnesium: 2.2 mg/dL (ref 1.7–2.4)

## 2023-08-27 MED ORDER — SODIUM CHLORIDE 0.9% IV SOLUTION: Freq: Once | INTRAVENOUS | Status: AC

## 2023-08-27 NOTE — Progress Notes (Signed)
 PT Cancellation Note  Patient Details Name: Dorothy Harris MRN: [id number] DOB: May 13, 1960   Cancelled Treatment:    Reason Eval/Treat Not Completed: Patient at procedure or test/unavailable Attempted PT, RN preparing to give blood.    [name], LPTA/CLT; CBIS [phone]  [name] 08/27/2023, 11:23 AM

## 2023-08-27 NOTE — Plan of Care (Signed)
  Problem: Education: Goal: Knowledge of General Education information will improve Description: Including pain rating scale, medication(s)/side effects and non-pharmacologic comfort measures Outcome: Progressing   Problem: Health Behavior/Discharge Planning: Goal: Ability to manage health-related needs will improve Outcome: Progressing   Problem: Clinical Measurements: Goal: Ability to maintain clinical measurements within normal limits will improve Outcome: Progressing Goal: Will remain free from infection Outcome: Progressing Goal: Diagnostic test results will improve Outcome: Progressing Goal: Respiratory complications will improve Outcome: Progressing Goal: Cardiovascular complication will be avoided Outcome: Not Progressing   Problem: Activity: Goal: Risk for activity intolerance will decrease Outcome: Not Progressing   Problem: Nutrition: Goal: Adequate nutrition will be maintained Outcome: Progressing   Problem: Coping: Goal: Level of anxiety will decrease Outcome: Progressing   Problem: Elimination: Goal: Will not experience complications related to bowel motility Outcome: Progressing Goal: Will not experience complications related to urinary retention Outcome: Progressing   Problem: Pain Managment: Goal: General experience of comfort will improve and/or be controlled Outcome: Not Progressing   Problem: Safety: Goal: Ability to remain free from injury will improve Outcome: Progressing   Problem: Skin Integrity: Goal: Risk for impaired skin integrity will decrease Outcome: Progressing

## 2023-08-27 NOTE — Discharge Instructions (Addendum)
 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  Staples at 9:00 o'clock; surgical team wants these covered so silicone tape applied, then a barrier ring and 2 piece pouching system.   BRING OSTOMY SUPPLIES FOR CHANGING BAG TO APPT 09/08/2023.   Discharge Open Abdominal Surgery Instructions:  Common Complaints: Pain at the incision site is common. This will improve with time. Take your pain medications as described below. Some nausea is common and poor appetite. The main goal is to stay hydrated the first few days after surgery.   Diet/ Activity: Diet as tolerated. You have started and tolerated a diet in the hospital, and should continue to increase what you are able to eat.   You may not have a large appetite, but it is important to stay hydrated. Drink 64 ounces of water  a day. Your appetite will return with time.  Keep a dry dressing in place over your staples daily or as needed. Some minor pink/ blood tinged drainage is expected. This will stop in a few days after surgery.  Shower per your regular routine daily.  Do not take hot showers. Take warm showers that are less than 10 minutes. Path the incision dry. Wear an abdominal binder daily with activity. You do not have to wear this while sleeping or sitting.  Rest and listen to your body, but do not remain in bed all day.  Walk everyday for at least 15-20 minutes. Deep cough and move around every 1-2 hours in the first few days after surgery.  Do not lift > 10 lbs, perform excessive bending, pushing, pulling, squatting for 6-8 weeks after surgery.  The activity restrictions and the abdominal binder are to prevent hernia formation at your incision while you are healing.  Do not place lotions or balms on your incision unless instructed to specifically by Dr. Collene Dawson.   Pain Expectations and Narcotics: -After surgery you will have pain associated with your incisions and this is normal. The pain is muscular  and nerve pain, and will get better with time. -You are encouraged and expected to take non narcotic medications like tylenol  and ibuprofen (when able) to treat pain as multiple modalities can aid with pain treatment. -Narcotics are only used when pain is severe or there is breakthrough pain. -You are not expected to have a pain score of 0 after surgery, as we cannot prevent pain. A pain score of 3-4 that allows you to be functional, move, walk, and tolerate some activity is the goal. The pain will continue to improve over the days after surgery and is dependent on your surgery. -Due to [place] law, we are only able to give a certain amount of pain medication to treat post operative pain, and we only give additional narcotics on a patient by patient basis.  -For most laparoscopic surgery, studies have shown that the majority of patients only need 10-15 narcotic pills, and for open surgeries most patients only need 15-20.   -Having appropriate expectations of pain and knowledge of pain management with non narcotics is important as we do not want anyone to become addicted to narcotic pain medication.  -Using ice packs in the first 48 hours and heating pads after 48 hours, wearing an abdominal binder (when recommended), and using over the counter medications are all ways to help with pain management.   -Simple acts like meditation and mindfulness practices after surgery can also help with pain control and research has proven the benefit of  these practices.  Medication: Take tylenol  and ibuprofen as needed for pain control, alternating every 4-6 hours.  Example:  Tylenol  1000mg  @ 6am, 12noon, 6pm, (Do not exceed 4000mg  of tylenol  a day). Ibuprofen 800mg  @ 9am, 3pm, 9pm, 3am (Do not exceed 3600mg  of ibuprofen a day).  Take Roxicodone  for breakthrough pain every 4 hours.  Take Colace for constipation related to narcotic pain medication. If you do not have a bowel movement in 2 days, take Miralax  over  the counter.  Drink plenty of water  to also prevent constipation.   Contact Information: If you have questions or concerns, please call our office, [phone], Monday- Thursday 8AM-5PM and Friday 8AM-12Noon.  If it is after hours or on the weekend, please call [facility] Main Number, [phone], [phone], and ask to speak to the surgeon on call for Dr. Collene Dawson at [facility].    IMPORTANT INFORMATION: PAY CLOSE ATTENTION   PHYSICIAN DISCHARGE INSTRUCTIONS  Follow with Primary care provider  [name], MD  and other consultants as instructed by your Hospitalist Physician  SEEK MEDICAL CARE OR RETURN TO EMERGENCY ROOM IF SYMPTOMS COME BACK, WORSEN OR NEW PROBLEM DEVELOPS   Please note: You were cared for by a hospitalist during your hospital stay. Every effort will be made to forward records to your primary care provider.  You can request that your primary care provider send for your hospital records if they have not received them.  Once you are discharged, your primary care physician will handle any further medical issues. Please note that NO REFILLS for any discharge medications will be authorized once you are discharged, as it is imperative that you return to your primary care physician (or establish a relationship with a primary care physician if you do not have one) for your post hospital discharge needs so that they can reassess your need for medications and monitor your lab values.  Please get a complete blood count and chemistry panel checked by your Primary MD at your next visit, and again as instructed by your Primary MD.  Get Medicines reviewed and adjusted: Please take all your medications with you for your next visit with your Primary MD  Laboratory/radiological data: Please request your Primary MD to go over all hospital tests and procedure/radiological results at the follow up, please ask your primary care provider to get all Hospital records sent to his/her  office.  In some cases, they will be blood work, cultures and biopsy results pending at the time of your discharge. Please request that your primary care provider follow up on these results.  If you are diabetic, please bring your blood sugar readings with you to your follow up appointment with primary care.    Please call and make your follow up appointments as soon as possible.    Also Note the following: If you experience worsening of your admission symptoms, develop shortness of breath, life threatening emergency, suicidal or homicidal thoughts you must seek medical attention immediately by calling 911 or calling your MD immediately  if symptoms less severe.  You must read complete instructions/literature along with all the possible adverse reactions/side effects for all the Medicines you take and that have been prescribed to you. Take any new Medicines after you have completely understood and accpet all the possible adverse reactions/side effects.   Do not drive when taking Pain medications or sleeping medications (Benzodiazepines)  Do not take more than prescribed Pain, Sleep and Anxiety Medications. It is not advisable to combine anxiety,sleep  and pain medications without talking with your primary care practitioner  Special Instructions: If you have smoked or chewed Tobacco  in the last 2 yrs please stop smoking, stop any regular Alcohol  and or any Recreational drug use.  Wear Seat belts while driving.  Do not drive if taking any narcotic, mind altering or controlled substances or recreational drugs or alcohol.

## 2023-08-27 NOTE — Progress Notes (Addendum)
   Patient Name: Dorothy Harris Date of Encounter: 08/27/2023 [facility] Cardiologist: Gaylyn Keas, MD   Interval Summary  .    Hgb stable at 8.6 on IV heparin . Patient reports dull stomach discomfort. She ate breakfast this morning. She remains in Afib with relatively controlled rates. She denies chest pain, SOB, or palpitations.   Vital Signs .    Vitals:   08/27/23 0600 08/27/23 0601 08/27/23 0700 08/27/23 0744  BP: 120/87 120/87 121/79   Pulse: (!) 117 (!) 101 (!) 108   Resp: 17 (!) 21 17   Temp:    98.9 F (37.2 C)  TempSrc:    Oral  SpO2: 90% 91% 91%   Weight:      Height:        Intake/Output Summary (Last 24 hours) at 08/27/2023 0807 Last data filed at 08/27/2023 0801 Gross per 24 hour  Intake 480.74 ml  Output 1700 ml  Net -1219.26 ml      08/27/2023    5:00 AM 08/26/2023    4:00 AM 08/25/2023    7:00 AM  Last 3 Weights  Weight (lbs) 250 lb 10.6 oz 244 lb 7.8 oz 235 lb 10.8 oz  Weight (kg) 113.7 kg 110.9 kg 106.9 kg      Telemetry/ECG    Afib rates 80-90s - Personally Reviewed  Physical Exam .   GEN: No acute distress.   Neck: No JVD Cardiac: Irreg IRreg, no murmurs, rubs, or gallops.  Respiratory: Clear to auscultation bilaterally. GI: Soft, nontender, non-distended  MS: No edema  Assessment & Plan .     Afib RVR - patient presented with high grade SBP and is now s/p exploratory lab with lysis of adhesions and resection - new on set Afib RVR this admission started on IV amiodarone  due to low Bps - recurrent high rates and she required re-bolus, now on 42ml/hr - also on Lopressor  injection 5mg  every 4 hours - rates 80-90s on telemetry - CHADSVASC of 2 (HTN and female) - Hgb 8.6, patient has been restarted on IV heparin  5/21 with plan to transition to DOAC. OK from surgery perspective. Will order 1 unit of blood prior to starting DOAC - Echo showed LVEF 60-65%  AKI on CKD stage IV - Scr 2.27, which is improving  Acute Anemia - s/p 2  unit PRBC transfusion - Hgb stable - transfuse 1 unit of PRBCs  HFpEF - patient does have lower leg edema, but albumin  is 1.6, suspect degree of third spacing - Echo with normal LVEF 60-65%  For questions or updates, please contact [facility] Please consult [email or web address] for contact info under        Signed, Tyanna Hach Rebekah Canada, PA-C

## 2023-08-27 NOTE — Plan of Care (Signed)
  Problem: Education: Goal: Knowledge of General Education information will improve Description: Including pain rating scale, medication(s)/side effects and non-pharmacologic comfort measures Outcome: Progressing   Problem: Coping: Goal: Level of anxiety will decrease Outcome: Progressing   Problem: Elimination: Goal: Will not experience complications related to urinary retention Outcome: Progressing   Problem: Safety: Goal: Ability to remain free from injury will improve Outcome: Progressing

## 2023-08-27 NOTE — TOC Progression Note (Signed)
 Transition of Care [facility]) - Progression Note    Patient Details  Name: Dorothy Harris MRN: [id number] Date of Birth: 1960-04-15  [facility] CM/SW Contact  Grandville Lax, [place] Phone Number: 08/27/2023, 10:10 AM  Clinical Narrative:    CSW updated that pt may be medically ready for SNF placement within the next couple days. CSW requested that Bambi Lever with [facility] start SNF auth, CSW updated that [facility] has no beds and they do not know when bed will open. CSW met with pt at bedside to review this and that a new bed will need to be chosen. CSW reviewed Medicare.gov star ratings with pt and she would like to accept bed at [facility]. CSW updated facility choice in HUB. CSW spoke to [facility] with admissions and requested that they start insurance auth at this time. TOC to follow.   Expected Discharge Plan: Home/Self Care Barriers to Discharge: Continued Medical Work up  Expected Discharge Plan and Services In-house Referral: Clinical Social Work Discharge Planning Services: CM Consult   Living arrangements for the past 2 months: Single Family Home                                       Social Determinants of Health (SDOH) Interventions SDOH Screenings   Food Insecurity: Food Insecurity Present (08/07/2023)  Housing: High Risk (08/07/2023)  Transportation Needs: No Transportation Needs (08/07/2023)  Utilities: Not At Risk (08/07/2023)  Alcohol Screen: Low Risk  (05/07/2021)  Depression (PHQ2-9): Medium Risk (05/05/2023)  Financial Resource Strain: Low Risk  (05/07/2021)  Physical Activity: Inactive (05/07/2021)  Tobacco Use: High Risk (08/20/2023)    Readmission Risk Interventions    08/11/2023    8:27 AM 08/07/2023    7:29 AM 01/22/2022   12:57 PM  Readmission Risk Prevention Plan  Medication Screening  Complete   Transportation Screening Complete Complete Complete  PCP or Specialist Appt within 3-5 Days   Complete  HRI or Home Care Consult Complete  Complete  Social Work  Consult for Recovery Care Planning/Counseling Complete  Complete  Palliative Care Screening Not Applicable  Not Applicable  Medication Review Oceanographer) Complete  Referral to Pharmacy

## 2023-08-27 NOTE — Progress Notes (Signed)
 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 EMS was activated and arrival of EMS team, patient was hypotensive with SBP in 70s, IV NS 250 mL bolus was given with SBP improving to 100's In WJ:[id number] stable. Labs-normal CBC, BMP showed AKI bicarb 19,Troponin 93 > 86. CT abdomen and pelvis>>high-grade small bowel obstruction with transition point likely somewhere in the distal third of the ileum.  Patient was given antibiotics IV x1 and placed on fluids, NG tube placement w/ drainage of 650 mL of greenish fluid and admitted.      Assessment and Plan:  High-Grade SBO status post exploratory lap with lysis of adhesions and resection of small bowel with end ileostomy revision 5/15 - Appreciate ongoing general surgery management  - Advance to soft diet,  TPN completed  - completed IV antibiotics course  - Continue pain management as needed while recovering  - Antiemetics as needed  - Currently on soft diet, NG tube has been removed   AKI ---acute kidney injury on CKD stage IV -due to dehydration/volume depletion in a patient with SBO and n.p.o. status with high output from NG tube-this has resolved - Appreciate nephrology involvement with plans to discontinue further IV fluid on 08/26/23. - Nephrology has signed off - creatinine back to baseline     Acute anemia likely postoperative equilibration-improved - S/p 2 unit PRBC transfusion, with additional 1 unit PRBC ordered 5/22  -  Continue to monitor CBC - No overt bleeding noted    Essential hypertension: - c/n to Hold PTA amlodipine  and oral hydralazine   - Continue to follow   Paroxysmal A-fib: -PTA amiodarone  and Coreg  on hold due to n.p.o. status -Patient was not on anticoagulation PTA - Echo 60-65% - Continue IV amiodarone  drip; intermittent boluses will be provided to assist with rate control - Plan to convert to oral amiodarone  once tolerating diet reliably and consistently - discussed with cardiology, started IV heparin  infusion 5/21, given stable H/H testing    HFpEF with LVEF 60-65% -completed IV fluid course -continue to follow I's and O's   HLD - Continue home dose atorvastatin    acute urinary retention - Continue Foley catheter until patient more ambulatory   obesity, class II - BMI 35.81   DVT prophylaxis: IV heparin  infusion  Code Status: Full Family Communication:  Disposition: anticipating SNF placement    Consultants:  Surgery  Cardiology  Procedures:   Antimicrobials:    Subjective: Pt reporting that she is tolerating sips of fluids and tolerating diet so far no specific complaints.    Objective: Vitals:   08/27/23 1252 08/27/23 1255 08/27/23 1340 08/27/23 1400  BP: 109/81 109/81 93/79 106/89  Pulse: (!) 103 (!) 108 (!) 109 (!) 114  Resp: (!) 25 (!) 22 (!) 22 20  Temp: 99 F (37.2 C)     TempSrc: Oral     SpO2: 95% 96% 94% 95%  Weight:      Height:        Intake/Output Summary (  Last 24 hours) at 08/27/2023 1451 Last data filed at 08/27/2023 1422 Gross per 24 hour  Intake 1087.64 ml  Output 2325 ml  Net -1237.36 ml   Filed Weights   08/25/23 0700 08/26/23 0400 08/27/23 0500  Weight: 106.9 kg 110.9 kg 113.7 kg   Examination:  General exam: Appears calm and comfortable  Respiratory system: Clear to auscultation. Respiratory effort normal. Cardiovascular system: normal S1 & S2 heard. No JVD, murmurs, rubs, gallops or clicks. No pedal edema. Gastrointestinal  system: Abdomen is nondistended, soft and nontender. No organomegaly or masses felt. Normal bowel sounds heard. Central nervous system: Alert and oriented. No focal neurological deficits. Extremities: Symmetric 5 x 5 power. Skin: No rashes, lesions or ulcers. Psychiatry: Judgement and insight appear normal. Mood & affect appropriate.   Data Reviewed: I have personally reviewed following labs and imaging studies  CBC: Recent Labs  Lab 08/21/23 0436 08/22/23 0508 08/23/23 0549 08/24/23 0405 08/25/23 0445 08/26/23 0335 08/27/23 0108  WBC 21.1*   < > 15.0* 12.3* 12.0* 11.4* 11.3*  NEUTROABS 17.4*  --   --   --   --   --   --   HGB 8.8*   < > 8.4* 8.0* 7.9* 8.2* 8.6*  HCT 28.8*   < > 26.3* 23.9* 25.1* 24.5* 26.3*  MCV 104.3*   < > 95.6 96.0 96.9 96.1 97.0  PLT 348   < > 297 298 295 303 312   < > = values in this interval not displayed.    Basic Metabolic Panel: Recent Labs  Lab 08/23/23 0549 08/24/23 0405 08/25/23 0445 08/26/23 0335 08/27/23 0108  NA 137 136 137 136 134*  K 3.7 3.9 4.1 4.6 4.4  CL 104 105 105 102 103  CO2 24 24 27 27 25   GLUCOSE 136* 145* 138* 121* 93  BUN 95* 84* 77* 67* 58*  CREATININE 3.14* 2.68* 2.38* 2.27* 2.15*  CALCIUM  7.9* 7.8* 8.0* 8.2* 8.1*  MG 2.1 2.0 2.0 2.1 2.2  PHOS 4.9* 3.2 2.5 2.7 3.5    CBG: Recent Labs  Lab 08/26/23 1641 08/26/23 1916 08/26/23 2109 08/27/23 0743 08/27/23 1131  GLUCAP 113* 149* 122* 107* 137*    Recent Results (from the past 240 hours)  Aerobic/Anaerobic Culture w Gram Stain (surgical/deep wound)     Status: None   Collection Time: 08/20/23 10:11 AM   Specimen: Abdomen; Abscess  Result Value Ref Range Status   Specimen Description   Final    WOUND Performed at [facility], [address] [place], [place] [address]    Special Requests   Final    NONE Performed at [facility], [address] [place], [place] [address]    Gram Stain   Final    FEW WBC PRESENT, PREDOMINANTLY PMN MODERATE GRAM NEGATIVE  RODS FEW GRAM POSITIVE COCCI IN PAIRS RARE GRAM POSITIVE RODS Performed at [facility], [address], [place], [place] [address]    Culture   Final    FEW PROTEUS MIRABILIS FEW ESCHERICHIA COLI FEW ENTEROCOCCUS FAECALIS MIXED ANAEROBIC FLORA PRESENT.  CALL LAB IF FURTHER IID REQUIRED.    Report Status 08/25/2023 FINAL  Final   Organism ID, Bacteria PROTEUS MIRABILIS  Final   Organism ID, Bacteria ESCHERICHIA COLI  Final   Organism ID, Bacteria ENTEROCOCCUS FAECALIS  Final      Susceptibility   Escherichia coli - MIC*    AMPICILLIN 8 SENSITIVE Sensitive     CEFEPIME  <=0.12 SENSITIVE Sensitive  CEFTAZIDIME <=1 SENSITIVE Sensitive     CEFTRIAXONE  <=0.25 SENSITIVE Sensitive     CIPROFLOXACIN <=0.25 SENSITIVE Sensitive     GENTAMICIN <=1 SENSITIVE Sensitive     IMIPENEM <=0.25 SENSITIVE Sensitive     TRIMETH/SULFA <=20 SENSITIVE Sensitive     AMPICILLIN/SULBACTAM 4 SENSITIVE Sensitive     PIP/TAZO <=4 SENSITIVE Sensitive ug/mL    * FEW ESCHERICHIA COLI   Enterococcus faecalis - MIC*    AMPICILLIN <=2 SENSITIVE Sensitive     VANCOMYCIN  1 SENSITIVE Sensitive     GENTAMICIN SYNERGY SENSITIVE Sensitive     * FEW ENTEROCOCCUS FAECALIS   Proteus mirabilis - MIC*    AMPICILLIN <=2 SENSITIVE Sensitive     CEFEPIME  <=0.12 SENSITIVE Sensitive     CEFTAZIDIME <=1 SENSITIVE Sensitive     CEFTRIAXONE  <=0.25 SENSITIVE Sensitive     CIPROFLOXACIN <=0.25 SENSITIVE Sensitive     GENTAMICIN <=1 SENSITIVE Sensitive     IMIPENEM 2 SENSITIVE Sensitive     TRIMETH/SULFA <=20 SENSITIVE Sensitive     AMPICILLIN/SULBACTAM <=2 SENSITIVE Sensitive     PIP/TAZO <=4 SENSITIVE Sensitive ug/mL    * FEW PROTEUS MIRABILIS     Radiology Studies: No results found.  Scheduled Meds:  acetaminophen   1,000 mg Oral Q6H   atorvastatin   80 mg Oral QPM   Chlorhexidine  Gluconate Cloth  6 each Topical Daily   feeding supplement  237 mL Oral TID BM   insulin  aspart  0-15 Units Subcutaneous TID WC &  HS   metoprolol  tartrate  5 mg Intravenous Q4H   pantoprazole  (PROTONIX ) IV  40 mg Intravenous Q12H   sodium chloride  flush  10-40 mL Intracatheter Q12H   Continuous Infusions:  amiodarone  30 mg/hr (08/27/23 1422)   heparin  1,450 Units/hr (08/27/23 1422)     LOS: 20 days   Time spent: 55 mins  Tyre Beaver Lincoln Renshaw, MD How to contact the [facility] Attending or Consulting provider 7A - 7P or covering provider during after hours 7P -7A, for this patient?  Check the care team in [facility] and look for a) attending/consulting TRH provider listed and b) the TRH team listed Log into [email or web address] to find provider on call.  Locate the TRH provider you are looking for under [facility] and page to a number that you can be directly reached. If you still have difficulty reaching the provider, please page the [facility] (Director on Call) for the Hospitalists listed on amion for assistance.  08/27/2023, 2:51 PM

## 2023-08-27 NOTE — Progress Notes (Signed)
 PHARMACY - ANTICOAGULATION CONSULT NOTE  Pharmacy Consult for heparin  Indication: atrial fibrillation  Allergies  Allergen Reactions   Chlorthalidone  Other (See Comments)    ELEVATED KIDNEY FUNCTION     Patient Measurements: Height: 5\' 7"  (170.2 cm) Weight: 113.7 kg (250 lb 10.6 oz) IBW/kg (Calculated) : 61.6 HEPARIN  DW (KG): 85  Vital Signs: Temp: 98.9 F (37.2 C) (05/22 0744) Temp Source: Oral (05/22 0744) BP: 97/67 (05/22 1000) Pulse Rate: 116 (05/22 1000)  Labs: Recent Labs    08/25/23 0445 08/26/23 0335 08/26/23 1833 08/27/23 0108 08/27/23 0948  HGB 7.9* 8.2*  --  8.6*  --   HCT 25.1* 24.5*  --  26.3*  --   PLT 295 303  --  312  --   HEPARINUNFRC  --   --  0.10* 0.58 0.48  CREATININE 2.38* 2.27*  --  2.15*  --     Estimated Creatinine Clearance: 35.3 mL/min (A) (by C-G formula based on SCr of 2.15 mg/dL (H)).   Medical History: 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HCC)    Anemia    Chronic kidney disease    Essential hypertension 05/07/2021   GERD (gastroesophageal reflux disease)    Hernia, epigastric    Hypertension    PAF (paroxysmal atrial fibrillation) (HCC) 05/07/2021   Pure hypercholesterolemia 05/07/2021    Medications:  Medications Prior to Admission  Medication Sig Dispense Refill Last Dose/Taking   acetaminophen  (TYLENOL ) 325 MG tablet Take 1-2 tablets (325-650 mg total) by mouth every 4 (four) hours as needed for mild pain.   08/06/2023   amLODipine  (NORVASC ) 10 MG tablet Take 1 tablet (10 mg total) by mouth daily. 90 tablet 3 08/06/2023   aspirin  81 MG chewable tablet Chew 4 tablets (324 mg total) by mouth daily.   08/06/2023   atorvastatin  (LIPITOR ) 80 MG tablet Take 1 tablet (80 mg total) by mouth daily. 90 tablet 3 08/06/2023   carvedilol  (COREG ) 12.5 MG tablet Take 1 tablet (12.5 mg total) by mouth 2 (two) times daily with a meal. 180 tablet 3 08/06/2023   cyclobenzaprine  (FLEXERIL ) 5 MG tablet Take 1 tablet (5 mg  total) by mouth as needed for up to 30 doses for muscle spasms. 30 tablet 0 Past Month   ferrous sulfate  325 (65 FE) MG tablet Take 1 tablet (325 mg total) by mouth daily with breakfast. 30 tablet 0 08/06/2023   hydrALAZINE  (APRESOLINE ) 25 MG tablet Take 1 tablet (25 mg total) by mouth in the morning and at bedtime. 180 tablet 3 08/06/2023   Multiple Vitamin (MULTIVITAMIN WITH MINERALS) TABS tablet Take 1 tablet by mouth daily.   08/06/2023    Assessment: Pharmacy consulted to dose heparin  in patient with new onset atrial fibrillation.  She has been on heparin  prophylaxis while inpatient with last dose 5/21 0514.  HL 0.48- therapeutic Hgb 8.6- stable   Goal of Therapy:  Heparin  level 0.3-0.7 units/ml Monitor platelets by anticoagulation protocol: Yes   Plan:  Continue heparin  infusion at 1450 units/hr Check anti-Xa level daily Continue to monitor H&H and platelets   Cliffton Dama, PharmD Clinical Pharmacist 08/27/2023 10:41 AM

## 2023-08-27 NOTE — Progress Notes (Signed)
 PHARMACY - ANTICOAGULATION CONSULT NOTE  Pharmacy Consult for heparin  Indication: atrial fibrillation  Labs: Recent Labs    08/25/23 0445 08/26/23 0335 08/26/23 1833 08/27/23 0108  HGB 7.9* 8.2*  --  8.6*  HCT 25.1* 24.5*  --  26.3*  PLT 295 303  --  312  HEPARINUNFRC  --   --  0.10* 0.58  CREATININE 2.38* 2.27*  --  2.15*   Assessment/Plan:  62yo female therapeutic on heparin  after rate change. Will continue infusion at current rate of 1450 units/hr and confirm stable with additional level.  Lonnie Roberts, PharmD, BCPS 08/27/2023 2:55 AM

## 2023-08-27 NOTE — Progress Notes (Signed)
 [facility]  Doing fair. Having appropriate pain. Eating and having output.   BP 97/67   Pulse (!) 116   Temp 98.9 F (37.2 C) (Oral)   Resp 14   Ht 5\' 7"  (1.702 m)   Wt 113.7 kg   SpO2 94%   BMI 39.26 kg/m  Soft, appropriately tender, ostomy pink and contents in bag, midline with staples, no further bleeding, dried blood noted, abd pad placed back on, RN can change  Patient s/p Ex lap, LOA, end ileostomy revision, SBR.  PRN for pain Soft diet Antibiotics completed post op  Ostomy Care    Future Appointments  Date Time Provider Department Center  09/08/2023 11:45 AM Awilda Bogus, MD [facility] None   Deena Farrier, MD [facility] [address]ise Barlow [place], [place] [address] [phone] (office)

## 2023-08-28 DIAGNOSIS — K56609 Unspecified intestinal obstruction, unspecified as to partial versus complete obstruction: Secondary | ICD-10-CM | POA: Diagnosis not present

## 2023-08-28 DIAGNOSIS — N179 Acute kidney failure, unspecified: Secondary | ICD-10-CM | POA: Diagnosis not present

## 2023-08-28 DIAGNOSIS — I4891 Unspecified atrial fibrillation: Secondary | ICD-10-CM | POA: Diagnosis not present

## 2023-08-28 DIAGNOSIS — I5032 Chronic diastolic (congestive) heart failure: Secondary | ICD-10-CM | POA: Diagnosis not present

## 2023-08-28 LAB — BASIC METABOLIC PANEL WITH GFR
Anion gap: 6 (ref 5–15)
BUN: 47 mg/dL — ABNORMAL HIGH (ref 8–23)
CO2: 25 mmol/L (ref 22–32)
Calcium: 8 mg/dL — ABNORMAL LOW (ref 8.9–10.3)
Chloride: 107 mmol/L (ref 98–111)
Creatinine, Ser: 2.19 mg/dL — ABNORMAL HIGH (ref 0.44–1.00)
GFR, Estimated: 25 mL/min — ABNORMAL LOW (ref >60)
Glucose, Bld: 99 mg/dL (ref 70–99)
Potassium: 4.2 mmol/L (ref 3.5–5.1)
Sodium: 138 mmol/L (ref 135–145)

## 2023-08-28 LAB — TYPE AND SCREEN
ABO/RH(D): A POS
Antibody Screen: NEGATIVE
Unit division: 0

## 2023-08-28 LAB — BPAM RBC
Blood Product Expiration Date: 202506132359
ISSUE DATE / TIME: 202505221114
Unit Type and Rh: 5100

## 2023-08-28 LAB — GLUCOSE, CAPILLARY
Glucose-Capillary: 96 mg/dL (ref 70–99)
Glucose-Capillary: 123 mg/dL — ABNORMAL HIGH (ref 70–99)
Glucose-Capillary: 123 mg/dL — ABNORMAL HIGH (ref 70–99)
Glucose-Capillary: 91 mg/dL (ref 70–99)

## 2023-08-28 LAB — CBC
HCT: 27.7 % — ABNORMAL LOW (ref 36.0–46.0)
Hemoglobin: 8.4 g/dL — ABNORMAL LOW (ref 12.0–15.0)
MCH: 30.3 pg (ref 26.0–34.0)
MCHC: 30.3 g/dL (ref 30.0–36.0)
MCV: 100 fL (ref 80.0–100.0)
Platelets: 289 10*3/uL (ref 150–400)
RBC: 2.77 MIL/uL — ABNORMAL LOW (ref 3.87–5.11)
RDW: 17 % — ABNORMAL HIGH (ref 11.5–15.5)
WBC: 9.9 10*3/uL (ref 4.0–10.5)
nRBC: 0.4 % — ABNORMAL HIGH (ref 0.0–0.2)

## 2023-08-28 LAB — HEPARIN LEVEL (UNFRACTIONATED)
Heparin Unfractionated: >1.1 [IU]/mL — ABNORMAL HIGH (ref 0.30–0.70)
Heparin Unfractionated: >1.1 [IU]/mL — ABNORMAL HIGH (ref 0.30–0.70)

## 2023-08-28 LAB — MAGNESIUM: Magnesium: 2 mg/dL (ref 1.7–2.4)

## 2023-08-28 MED ORDER — METOPROLOL TARTRATE 25 MG PO TABS
25.0000 mg | ORAL_TABLET | Freq: Four times a day (QID) | ORAL | Status: AC
  Administered 2023-08-28 – 2023-08-29 (×8): 25 mg via ORAL
  Filled 2023-08-28 (×8): qty 1

## 2023-08-28 MED ORDER — HEPARIN (PORCINE) 25000 UT/250ML-% IV SOLN
1200.0000 [IU]/h | INTRAVENOUS | Status: DC
  Administered 2023-08-28: 1000 [IU]/h via INTRAVENOUS

## 2023-08-28 MED ORDER — HEPARIN (PORCINE) 25000 UT/250ML-% IV SOLN
1250.0000 [IU]/h | INTRAVENOUS | Status: DC
  Administered 2023-08-28: 1250 [IU]/h via INTRAVENOUS
  Filled 2023-08-28: qty 250

## 2023-08-28 MED ORDER — AMIODARONE HCL 200 MG PO TABS
200.0000 mg | ORAL_TABLET | Freq: Two times a day (BID) | ORAL | Status: DC
  Administered 2023-08-28 – 2023-08-30 (×5): 200 mg via ORAL
  Filled 2023-08-28 (×5): qty 1

## 2023-08-28 NOTE — Plan of Care (Signed)

## 2023-08-28 NOTE — Progress Notes (Signed)
 Rounding Note    Patient Name: Dorothy Harris Date of Encounter: 08/28/2023  [facility] Cardiologist: Gaylyn Keas, MD   Subjective   No complaints  Inpatient Medications    Scheduled Meds:  acetaminophen   1,000 mg Oral Q6H   atorvastatin   80 mg Oral QPM   Chlorhexidine  Gluconate Cloth  6 each Topical Daily   feeding supplement  237 mL Oral TID BM   insulin  aspart  0-15 Units Subcutaneous TID WC & HS   metoprolol  tartrate  5 mg Intravenous Q4H   pantoprazole  (PROTONIX ) IV  40 mg Intravenous Q12H   sodium chloride  flush  10-40 mL Intracatheter Q12H   Continuous Infusions:  amiodarone  30 mg/hr (08/28/23 0346)   heparin  1,450 Units/hr (08/27/23 1936)   PRN Meds: hydrALAZINE , HYDROmorphone  (DILAUDID ) injection, melatonin, oxyCODONE , phenol, prochlorperazine , sodium chloride  flush   Vital Signs    Vitals:   08/28/23 0500 08/28/23 0600 08/28/23 0700 08/28/23 0756  BP: 125/78 120/86 111/78   Pulse: 78 79    Resp: 11 15    Temp:    98.1 F (36.7 C)  TempSrc:    Oral  SpO2: 99% 99%  93%  Weight:      Height:        Intake/Output Summary (Last 24 hours) at 08/28/2023 0818 Last data filed at 08/28/2023 0700 Gross per 24 hour  Intake 616.9 ml  Output 850 ml  Net -233.1 ml      08/28/2023    4:30 AM 08/27/2023    5:00 AM 08/26/2023    4:00 AM  Last 3 Weights  Weight (lbs) 250 lb 3.6 oz 250 lb 10.6 oz 244 lb 7.8 oz  Weight (kg) 113.5 kg 113.7 kg 110.9 kg      Telemetry    Afib 80s to 110s - Personally Reviewed  ECG    N/a - Personally Reviewed  Physical Exam   GEN: No acute distress.   Neck: No JVD Cardiac: irreg Respiratory: Clear to auscultation bilaterally. GI: Soft, nontender, non-distended  MS: No edema; No deformity. Neuro:  Nonfocal  Psych: Normal affect   Labs    High Sensitivity Troponin:   Recent Labs  Lab 08/06/23 2148 08/06/23 2312 08/10/23 0513 08/10/23 0838  TROPONINIHS 93* 86* 214* 206*     Chemistry Recent Labs   Lab 08/24/23 0405 08/25/23 0445 08/26/23 0335 08/27/23 0108 08/28/23 0441  NA 136 137 136 134* 138  K 3.9 4.1 4.6 4.4 4.2  CL 105 105 102 103 107  CO2 24 27 27 25 25   GLUCOSE 145* 138* 121* 93 99  BUN 84* 77* 67* 58* 47*  CREATININE 2.68* 2.38* 2.27* 2.15* 2.19*  CALCIUM  7.8* 8.0* 8.2* 8.1* 8.0*  MG 2.0 2.0 2.1 2.2 2.0  PROT 5.0*  --   --  6.1*  --   ALBUMIN  <1.5* <1.5* 1.6* 1.7*  --   AST 10*  --   --  12*  --   ALT 16  --   --  15  --   ALKPHOS 91  --   --  124  --   BILITOT 0.4  --   --  0.7  --   GFRNONAA 20* 23* 24* 25* 25*  ANIONGAP 7 5 8 6 6     Lipids  Recent Labs  Lab 08/24/23 0405  TRIG 127    Hematology Recent Labs  Lab 08/26/23 0335 08/27/23 0108 08/28/23 0441  WBC 11.4* 11.3* 9.9  RBC 2.55* 2.71* 2.77*  HGB 8.2* 8.6* 8.4*  HCT 24.5* 26.3* 27.7*  MCV 96.1 97.0 100.0  MCH 32.2 31.7 30.3  MCHC 33.5 32.7 30.3  RDW 17.0* 16.9* 17.0*  PLT 303 312 289   Thyroid No results for input(s): "TSH", "FREET4" in the last 168 hours.  BNPNo results for input(s): "BNP", "PROBNP" in the last 168 hours.  DDimer No results for input(s): "DDIMER" in the last 168 hours.   Radiology    No results found.  Cardiac Studies   08/2023 echo 1. Left ventricular ejection fraction, by estimation, is 60 to 65%. The  left ventricle has normal function. The left ventricle has no regional  wall motion abnormalities. There is severe asymmetric left ventricular  hypertrophy of the septal segment. Left   ventricular diastolic function could not be evaluated.   2. Right ventricular systolic function is normal. The right ventricular  size is normal. There is normal pulmonary artery systolic pressure.   3. Left atrial size was severely dilated.   4. The mitral valve is normal in structure. Trivial mitral valve  regurgitation. No evidence of mitral stenosis.   5. The aortic valve is tricuspid. There is moderate calcification of the  aortic valve. Aortic valve regurgitation is not  visualized. No aortic  stenosis is present.   6. The inferior vena cava is normal in size with greater than 50%  respiratory variability, suggesting right atrial pressure of 3 mmHg.     Assessment & Plan    1.Afib - history of postop afib in 2022, had not been a long term issue - new diagnosis this admission, issues with afib with RVR - management complicated by low bp's, started on amiodarone . - on amio gtt, when stable with PO intake from SBO standpoint can transition to oral - on hep gtt, transition to DOAC closer to discharge. Would use eliquis 5mg  bid.   - on amio gtt at 30mg /hr, IV lopressor  5mg  every 4 hours - kept food down yesterday. We will transition her IV lopressor  to oral. Start oral lopressro 25mg  every 6 hours with hold parameters. Transition amio to 200mg  bid x 3 weeks then 200mg  daily. Can titrate lopressor  as needed, can consolidate dosing based on rates and bp's.  -08/2023 echo: LVEF 60-65%, no WMAs, severe asymmetric LVH, severe LAE,  - can decide at outpatient f/u once recovered from acute medical illness if still in afib whether to consider DCCV, or focus just on rate control.    2. LVH - 08/2023 echo LVH with septum 1.8 cm, posterior wall 1.3 cm    3. 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 Myles Arvin at [facility] vascular surgery, patient refused surgery due to risk of paraplegia    4. SBO - s/p exploratoy lap with lysis adhesions 5/15.    5. AKI - Cr 4.59 on admission, peaked at 8 - nephrology following     6. Elevated troponin - suspect demand ischemia in setting of afib with RVR, SBO, severe AKI - -08/2023 echo: LVEF 60-65%, no WMAs, severe asymmetric LVH, severe LAE,  - no plans for ischemic testing.  For questions or updates, please contact [facility] Please consult [email or web address] for contact info under        Signed, Armida Lander, MD  08/28/2023, 8:18 AM

## 2023-08-28 NOTE — TOC Progression Note (Signed)
 Transition of Care [facility]) - Progression Note    Patient Details  Name: Dorothy Harris MRN: [id number] Date of Birth: [birth year]  [facility] CM/SW Contact  [place]ville Lax, [place] Phone Number: 08/28/2023, 10:09 AM  Clinical Narrative:    CSW spoke to admissions with [place] who states that pts insurance [employer] is pending at this time. TOC to follow.   Expected Discharge Plan: Home/Self Care Barriers to Discharge: Continued Medical Work up  Expected Discharge Plan and Services In-house Referral: Clinical Social Work Discharge Planning Services: CM Consult   Living arrangements for the past 2 months: Single Family Home                                       Social Determinants of Health (SDOH) Interventions SDOH Screenings   Food Insecurity: Food Insecurity Present (08/07/2023)  Housing: High Risk (08/07/2023)  Transportation Needs: No Transportation Needs (08/07/2023)  Utilities: Not At Risk (08/07/2023)  Alcohol Screen: Low Risk  (05/07/2021)  Depression (PHQ2-9): Medium Risk (05/05/2023)  Financial Resource Strain: Low Risk  (05/07/2021)  Physical Activity: Inactive (05/07/2021)  Tobacco Use: High Risk (08/20/2023)    Readmission Risk Interventions    08/11/2023    8:27 AM 08/07/2023    7:29 AM 01/22/2022   12:57 PM  Readmission Risk Prevention Plan  Medication Screening  Complete   Transportation Screening Complete Complete Complete  PCP or Specialist Appt within 3-5 Days   Complete  HRI or Home Care Consult Complete  Complete  Social Work Consult for Recovery Care Planning/Counseling Complete  Complete  Palliative Care Screening Not Applicable  Not Applicable  Medication Review Oceanographer) Complete  Referral to Pharmacy

## 2023-08-28 NOTE — Plan of Care (Signed)
   Problem: Education: Goal: Knowledge of General Education information will improve Description: Including pain rating scale, medication(s)/side effects and non-pharmacologic comfort measures Outcome: Progressing   Problem: Safety: Goal: Ability to remain free from injury will improve Outcome: Progressing   Problem: Skin Integrity: Goal: Risk for impaired skin integrity will decrease Outcome: Progressing

## 2023-08-28 NOTE — Progress Notes (Signed)
 PHARMACY - ANTICOAGULATION CONSULT NOTE  Pharmacy Consult for heparin  Indication: atrial fibrillation  Allergies  Allergen Reactions   Chlorthalidone  Other (See Comments)    ELEVATED KIDNEY FUNCTION     Patient Measurements: Height: 5\' 7"  (170.2 cm) Weight: 113.5 kg (250 lb 3.6 oz) IBW/kg (Calculated) : 61.6 HEPARIN  DW (KG): 85  Vital Signs: Temp: 98.1 F (36.7 C) (05/23 0756) Temp Source: Oral (05/23 0756) BP: 111/78 (05/23 0700) Pulse Rate: 79 (05/23 0600)  Labs: Recent Labs    08/26/23 0335 08/26/23 1833 08/27/23 0108 08/27/23 0948 08/28/23 0441  HGB 8.2*  --  8.6*  --  8.4*  HCT 24.5*  --  26.3*  --  27.7*  PLT 303  --  312  --  289  HEPARINUNFRC  --    < > 0.58 0.48 >1.10*  CREATININE 2.27*  --  2.15*  --  2.19*   < > = values in this interval not displayed.    Estimated Creatinine Clearance: 34.6 mL/min (A) (by C-G formula based on SCr of 2.19 mg/dL (H)).   Medical History: 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HCC)    Anemia    Chronic kidney disease    Essential hypertension 05/07/2021   GERD (gastroesophageal reflux disease)    Hernia, epigastric    Hypertension    PAF (paroxysmal atrial fibrillation) (HCC) 05/07/2021   Pure hypercholesterolemia 05/07/2021    Medications:  Medications Prior to Admission  Medication Sig Dispense Refill Last Dose/Taking   acetaminophen  (TYLENOL ) 325 MG tablet Take 1-2 tablets (325-650 mg total) by mouth every 4 (four) hours as needed for mild pain.   08/06/2023   amLODipine  (NORVASC ) 10 MG tablet Take 1 tablet (10 mg total) by mouth daily. 90 tablet 3 08/06/2023   aspirin  81 MG chewable tablet Chew 4 tablets (324 mg total) by mouth daily.   08/06/2023   atorvastatin  (LIPITOR ) 80 MG tablet Take 1 tablet (80 mg total) by mouth daily. 90 tablet 3 08/06/2023   carvedilol  (COREG ) 12.5 MG tablet Take 1 tablet (12.5 mg total) by mouth 2 (two) times daily with a meal. 180 tablet 3 08/06/2023    cyclobenzaprine  (FLEXERIL ) 5 MG tablet Take 1 tablet (5 mg total) by mouth as needed for up to 30 doses for muscle spasms. 30 tablet 0 Past Month   ferrous sulfate  325 (65 FE) MG tablet Take 1 tablet (325 mg total) by mouth daily with breakfast. 30 tablet 0 08/06/2023   hydrALAZINE  (APRESOLINE ) 25 MG tablet Take 1 tablet (25 mg total) by mouth in the morning and at bedtime. 180 tablet 3 08/06/2023   Multiple Vitamin (MULTIVITAMIN WITH MINERALS) TABS tablet Take 1 tablet by mouth daily.   08/06/2023    Assessment: Pharmacy consulted to dose heparin  in patient with new onset atrial fibrillation.  She has been on heparin  prophylaxis while inpatient with last dose 5/21 0514.  HL > 1.1, supratherapeutic Hgb 8.6- stable   Goal of Therapy:  Heparin  level 0.3-0.7 units/ml Monitor platelets by anticoagulation protocol: Yes   Plan:  Hold heparin  for 1 hour, then decrease heparin  infusion to 1250 units/hr Check anti-Xa level in 6-8 and daily Continue to monitor H&H and platelets F/U transition to DOAC  Lora Robinsons, BS Pharm D, BCPS Clinical Pharmacist 08/28/2023 9:24 AM

## 2023-08-28 NOTE — Consult Note (Signed)
 WOC Nurse ostomy follow up Pt was independent with pouch application and emptying prior to admission.   Ileostomy stoma is red and viable, 1 1/2 inches, above skin level. Staples at 9:00 o'clock; surgical team wants these covered so silicone tape applied, then a barrier ring and 2 piece pouching system. Pt will be able to use her same supplies as prior to admission after she is discharged.   50cc liquid brown stool emptied. 3 sets of each supply left at the bedside for staff nurses' use: Use Supplies: barrier rings, Timm Foot # [phone], wafer Timm Foot # 2, pouch Lawson # [id number] (Staples on the lateral edge-covered with the wafer); do not want the staples exposed keep them covered with silicone tape under wafer. Pt denies further questions and plans to d/c to a SNF for further assistance after discharge, according to progress notes.   Thank-you,  Wiliam Harder MSN, RN, CWOCN, [place], CNS [phone]

## 2023-08-28 NOTE — Progress Notes (Signed)
 [facility]  Eating and having ostomy output. Foley out. Still waiting on SNF.   BP 104/75   Pulse (!) 191   Temp 98.1 F (36.7 C) (Oral)   Resp 14   Ht 5\' 7"  (1.702 m)   Wt 113.5 kg   SpO2 98%   BMI 39.19 kg/m  Soft, nondistended, appropriately tender, staples c/d/I with no erythema or drainage, ostomy with output and gas in bag  Patient s/p Ex lap, LOA, end ileostomy revision, SBR.  PRN for pain Soft diet Antibiotics completed post op  Ostomy Care   Future Appointments  Date Time Provider Department Center  09/08/2023 11:45 AM Awilda Bogus, MD [facility] None   SNF Pending.   Deena Farrier, MD [facility] [address] [place], [place] [address] [phone] (office)

## 2023-08-28 NOTE — Progress Notes (Signed)
 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 EMS was activated and arrival of EMS team, patient was hypotensive with SBP in 70s, IV NS 250 mL bolus was given with SBP improving to 100's In BM:[id number] stable. Labs-normal CBC, BMP showed AKI bicarb 19,Troponin 93 > 86. CT abdomen and pelvis>>high-grade small bowel obstruction with transition point likely somewhere in the distal third of the ileum.  Patient was given antibiotics IV x1 and placed on fluids, NG tube placement w/ drainage of 650 mL of greenish fluid and admitted.     Assessment and Plan:  High-Grade SBO status post exploratory lap with lysis of adhesions and resection of small bowel with end ileostomy revision 5/15 - Appreciate ongoing general surgery management  - Advance to soft diet,  TPN completed  - completed IV antibiotics course  - Continue pain management as needed while recovering  - Antiemetics as needed  - Currently on soft diet, NG tube has been removed   AKI ---acute kidney injury on CKD stage IV -due to dehydration/volume depletion in a patient with SBO and n.p.o. status with high output from NG tube-this has resolved - Appreciate nephrology involvement with plans to discontinue further IV fluid on 08/26/23. - Nephrology has signed off - creatinine back to baseline     Acute anemia likely postoperative equilibration-improved - S/p 2 unit PRBC transfusion, with additional 1 unit PRBC ordered 5/22  -  Continue to monitor CBC - No overt bleeding noted    Essential hypertension: - c/n to Hold PTA amlodipine  and oral hydralazine   - Continue to follow   Paroxysmal A-fib: -PTA amiodarone  and Coreg  on hold due to n.p.o. status -Patient was not on anticoagulation PTA - Echo 60-65% - Pt was treated initially with IV amiodarone  drip with intermittent boluses.  - on 5/23 transitioned  to oral amiodarone  200 mg BID x 3 weeks, followed by 200 mg daily  - discussed with cardiology, remains on IV heparin  infusion 5/21, plan to transition to apixaban closer to discharge to SNF    HFpEF with LVEF 60-65% -completed IV fluid course -continue to follow I's and O's   HLD - Continue home dose atorvastatin    acute urinary retention - Continue Foley catheter until patient more ambulatory   obesity, class II - BMI 35.81   DVT prophylaxis: IV heparin  infusion  Code Status: Full Family Communication:  Disposition: anticipating SNF placement    Consultants:  Surgery  Cardiology  Procedures:   Antimicrobials:    Subjective: Pt reports that she is tired of waiting here in the hospital wanting to go to rehab.    Objective: Vitals:   08/28/23 0500 08/28/23 0600 08/28/23 0700 08/28/23 0756  BP: 125/78 120/86 111/78   Pulse: 78 79    Resp: 11 15    Temp:    98.1 F (36.7 C)  TempSrc:    Oral  SpO2: 99% 99%  93%  Weight:      Height:  Intake/Output Summary (Last 24 hours) at 08/28/2023 1107 Last data filed at 08/28/2023 0800 Gross per 24 hour  Intake 856.9 ml  Output 850 ml  Net 6.9 ml   Filed Weights   08/26/23 0400 08/27/23 0500 08/28/23 0430  Weight: 110.9 kg 113.7 kg 113.5 kg   Examination:  General exam: Appears calm and comfortable  Respiratory system: Clear to auscultation. Respiratory effort normal. Cardiovascular system: normal S1 & S2 heard. No JVD, murmurs, rubs, gallops or clicks. No pedal edema. Gastrointestinal system: Abdomen is nondistended, soft and  nontender. No organomegaly or masses felt. Normal bowel sounds heard. Central nervous system: Alert and oriented. No focal neurological deficits. Extremities: Symmetric 5 x 5 power. Skin: No rashes, lesions or ulcers. Psychiatry: Judgement and insight appear normal. Mood & affect appropriate.   Data Reviewed: I have personally reviewed following labs and imaging studies  CBC: Recent Labs  Lab 08/24/23 0405 08/25/23 0445 08/26/23 0335 08/27/23 0108 08/28/23 0441  WBC 12.3* 12.0* 11.4* 11.3* 9.9  HGB 8.0* 7.9* 8.2* 8.6* 8.4*  HCT 23.9* 25.1* 24.5* 26.3* 27.7*  MCV 96.0 96.9 96.1 97.0 100.0  PLT 298 295 303 312 289    Basic Metabolic Panel: Recent Labs  Lab 08/23/23 0549 08/24/23 0405 08/25/23 0445 08/26/23 0335 08/27/23 0108 08/28/23 0441  NA 137 136 137 136 134* 138  K 3.7 3.9 4.1 4.6 4.4 4.2  CL 104 105 105 102 103 107  CO2 24 24 27 27 25 25   GLUCOSE 136* 145* 138* 121* 93 99  BUN 95* 84* 77* 67* 58* 47*  CREATININE 3.14* 2.68* 2.38* 2.27* 2.15* 2.19*  CALCIUM  7.9* 7.8* 8.0* 8.2* 8.1* 8.0*  MG 2.1 2.0 2.0 2.1 2.2 2.0  PHOS 4.9* 3.2 2.5 2.7 3.5  --     CBG: Recent Labs  Lab 08/27/23 0743 08/27/23 1131 08/27/23 1629 08/27/23 2124 08/28/23 0740  GLUCAP 107* 137* 119* 108* 96    Recent Results (from the past 240 hours)  Aerobic/Anaerobic Culture w Gram Stain (surgical/deep wound)     Status: None   Collection Time: 08/20/23 10:11 AM   Specimen: Abdomen; Abscess  Result Value Ref Range Status   Specimen Description   Final    WOUND Performed at [facility], [address], [place], [place] [address]    Special Requests   Final    NONE Performed at [facility], [address], [place], [place] [address]    Gram Stain   Final    FEW WBC PRESENT, PREDOMINANTLY PMN MODERATE GRAM NEGATIVE RODS FEW GRAM POSITIVE COCCI IN PAIRS RARE GRAM POSITIVE RODS Performed at [facility], [address] [place], [place] [address]    Culture   Final    FEW  PROTEUS MIRABILIS FEW ESCHERICHIA COLI FEW ENTEROCOCCUS FAECALIS MIXED ANAEROBIC FLORA PRESENT.  CALL LAB IF FURTHER IID REQUIRED.    Report Status 08/25/2023 FINAL  Final   Organism ID, Bacteria PROTEUS MIRABILIS  Final   Organism ID, Bacteria ESCHERICHIA COLI  Final   Organism ID, Bacteria ENTEROCOCCUS FAECALIS  Final      Susceptibility   Escherichia coli - MIC*    AMPICILLIN 8 SENSITIVE Sensitive     CEFEPIME  <=0.12 SENSITIVE Sensitive     CEFTAZIDIME <=1 SENSITIVE Sensitive     CEFTRIAXONE  <=0.25 SENSITIVE Sensitive     CIPROFLOXACIN <=0.25 SENSITIVE Sensitive     GENTAMICIN <=1 SENSITIVE Sensitive     IMIPENEM <=0.25 SENSITIVE Sensitive     TRIMETH/SULFA <=20 SENSITIVE  Sensitive     AMPICILLIN/SULBACTAM 4 SENSITIVE Sensitive     PIP/TAZO <=4 SENSITIVE Sensitive ug/mL    * FEW ESCHERICHIA COLI   Enterococcus faecalis - MIC*    AMPICILLIN <=2 SENSITIVE Sensitive     VANCOMYCIN  1 SENSITIVE Sensitive     GENTAMICIN SYNERGY SENSITIVE Sensitive     * FEW ENTEROCOCCUS FAECALIS   Proteus mirabilis - MIC*    AMPICILLIN <=2 SENSITIVE Sensitive     CEFEPIME  <=0.12 SENSITIVE Sensitive     CEFTAZIDIME <=1 SENSITIVE Sensitive     CEFTRIAXONE  <=0.25 SENSITIVE Sensitive     CIPROFLOXACIN <=0.25 SENSITIVE Sensitive     GENTAMICIN <=1 SENSITIVE Sensitive     IMIPENEM 2 SENSITIVE Sensitive     TRIMETH/SULFA <=20 SENSITIVE Sensitive     AMPICILLIN/SULBACTAM <=2 SENSITIVE Sensitive     PIP/TAZO <=4 SENSITIVE Sensitive ug/mL    * FEW PROTEUS MIRABILIS     Radiology Studies: No results found.  Scheduled Meds:  acetaminophen   1,000 mg Oral Q6H   amiodarone   200 mg Oral BID   atorvastatin   80 mg Oral QPM   Chlorhexidine  Gluconate Cloth  6 each Topical Daily   feeding supplement  237 mL Oral TID BM   insulin  aspart  0-15 Units Subcutaneous TID WC & HS   metoprolol  tartrate  25 mg Oral QID   pantoprazole  (PROTONIX ) IV  40 mg Intravenous Q12H   sodium chloride  flush  10-40 mL  Intracatheter Q12H   Continuous Infusions:  heparin  1,250 Units/hr (08/28/23 1035)     LOS: 21 days   Time spent: 54 mins  Ami Mally Lincoln Renshaw, MD How to contact the [facility] Attending or Consulting provider 7A - 7P or covering provider during after hours 7P -7A, for this patient?  Check the care team in [facility] and look for a) attending/consulting [facility] provider listed and b) the [facility] team listed Log into [email or web address] to find provider on call.  Locate the [facility] provider you are looking for under [facility] and page to a number that you can be directly reached. If you still have difficulty reaching the provider, please page the [facility] (Director on Call) for the Hospitalists listed on amion for assistance.  08/28/2023, 11:07 AM

## 2023-08-28 NOTE — Progress Notes (Signed)
 PHARMACY - ANTICOAGULATION CONSULT NOTE  Pharmacy Consult for heparin  Indication: atrial fibrillation  Allergies  Allergen Reactions   Chlorthalidone  Other (See Comments)    ELEVATED KIDNEY FUNCTION     Patient Measurements: Height: 5\' 7"  (170.2 cm) Weight: 113.5 kg (250 lb 3.6 oz) IBW/kg (Calculated) : 61.6 HEPARIN  DW (KG): 85  Vital Signs: Temp: 98.1 F (36.7 C) (05/23 1200) Temp Source: Oral (05/23 1200) BP: 104/75 (05/23 1200) Pulse Rate: 191 (05/23 1200)  Labs: Recent Labs    08/26/23 0335 08/26/23 1833 08/27/23 0108 08/27/23 0948 08/28/23 0441  HGB 8.2*  --  8.6*  --  8.4*  HCT 24.5*  --  26.3*  --  27.7*  PLT 303  --  312  --  289  HEPARINUNFRC  --    < > 0.58 0.48 >1.10*  CREATININE 2.27*  --  2.15*  --  2.19*   < > = values in this interval not displayed.    Estimated Creatinine Clearance: 34.6 mL/min (A) (by C-G formula based on SCr of 2.19 mg/dL (H)).   Medical History: 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HCC)    Anemia    Chronic kidney disease    Essential hypertension 05/07/2021   GERD (gastroesophageal reflux disease)    Hernia, epigastric    Hypertension    PAF (paroxysmal atrial fibrillation) (HCC) 05/07/2021   Pure hypercholesterolemia 05/07/2021    Medications:  Medications Prior to Admission  Medication Sig Dispense Refill Last Dose/Taking   acetaminophen  (TYLENOL ) 325 MG tablet Take 1-2 tablets (325-650 mg total) by mouth every 4 (four) hours as needed for mild pain.   08/06/2023   amLODipine  (NORVASC ) 10 MG tablet Take 1 tablet (10 mg total) by mouth daily. 90 tablet 3 08/06/2023   aspirin  81 MG chewable tablet Chew 4 tablets (324 mg total) by mouth daily.   08/06/2023   atorvastatin  (LIPITOR ) 80 MG tablet Take 1 tablet (80 mg total) by mouth daily. 90 tablet 3 08/06/2023   carvedilol  (COREG ) 12.5 MG tablet Take 1 tablet (12.5 mg total) by mouth 2 (two) times daily with a meal. 180 tablet 3 08/06/2023    cyclobenzaprine  (FLEXERIL ) 5 MG tablet Take 1 tablet (5 mg total) by mouth as needed for up to 30 doses for muscle spasms. 30 tablet 0 Past Month   ferrous sulfate  325 (65 FE) MG tablet Take 1 tablet (325 mg total) by mouth daily with breakfast. 30 tablet 0 08/06/2023   hydrALAZINE  (APRESOLINE ) 25 MG tablet Take 1 tablet (25 mg total) by mouth in the morning and at bedtime. 180 tablet 3 08/06/2023   Multiple Vitamin (MULTIVITAMIN WITH MINERALS) TABS tablet Take 1 tablet by mouth daily.   08/06/2023    Assessment: Pharmacy consulted to dose heparin  in patient with new onset atrial fibrillation.  She has been on heparin  prophylaxis while inpatient with last dose 5/21 0514.  HL > 1.1, supratherapeutic Hgb 8.4- stable   Goal of Therapy:  Heparin  level 0.3-0.7 units/ml Monitor platelets by anticoagulation protocol: Yes   Plan:  Hold heparin  for 1 hour, then decrease heparin  infusion to 1000 units/hr Check anti-Xa level in 6 hours and daily Continue to monitor H&H and platelets F/U transition to DOAC  Genoveva Kidney, PharmD, BCPS Clinical Pharmacist 08/28/2023 5:15 PM

## 2023-08-29 DIAGNOSIS — I4891 Unspecified atrial fibrillation: Secondary | ICD-10-CM | POA: Diagnosis not present

## 2023-08-29 DIAGNOSIS — N179 Acute kidney failure, unspecified: Secondary | ICD-10-CM | POA: Diagnosis not present

## 2023-08-29 DIAGNOSIS — I5032 Chronic diastolic (congestive) heart failure: Secondary | ICD-10-CM | POA: Diagnosis not present

## 2023-08-29 DIAGNOSIS — K56609 Unspecified intestinal obstruction, unspecified as to partial versus complete obstruction: Secondary | ICD-10-CM | POA: Diagnosis not present

## 2023-08-29 LAB — CBC
HCT: 28 % — ABNORMAL LOW (ref 36.0–46.0)
Hemoglobin: 8.6 g/dL — ABNORMAL LOW (ref 12.0–15.0)
MCH: 31.2 pg (ref 26.0–34.0)
MCHC: 30.7 g/dL (ref 30.0–36.0)
MCV: 101.4 fL — ABNORMAL HIGH (ref 80.0–100.0)
Platelets: 310 10*3/uL (ref 150–400)
RBC: 2.76 MIL/uL — ABNORMAL LOW (ref 3.87–5.11)
RDW: 16.8 % — ABNORMAL HIGH (ref 11.5–15.5)
WBC: 8.9 10*3/uL (ref 4.0–10.5)
nRBC: 0.3 % — ABNORMAL HIGH (ref 0.0–0.2)

## 2023-08-29 LAB — GLUCOSE, CAPILLARY
Glucose-Capillary: 96 mg/dL (ref 70–99)
Glucose-Capillary: 106 mg/dL — ABNORMAL HIGH (ref 70–99)
Glucose-Capillary: 116 mg/dL — ABNORMAL HIGH (ref 70–99)
Glucose-Capillary: 104 mg/dL — ABNORMAL HIGH (ref 70–99)

## 2023-08-29 LAB — HEPARIN LEVEL (UNFRACTIONATED): Heparin Unfractionated: 0.13 [IU]/mL — ABNORMAL LOW (ref 0.30–0.70)

## 2023-08-29 MED ORDER — POLYETHYLENE GLYCOL 3350 17 G PO PACK: 17.0000 g | PACK | Freq: Every day | ORAL | Status: DC | PRN

## 2023-08-29 MED ORDER — ACETAMINOPHEN 500 MG PO TABS: 1000.0000 mg | ORAL_TABLET | Freq: Four times a day (QID) | ORAL | Status: DC

## 2023-08-29 MED ORDER — APIXABAN 5 MG PO TABS
5.0000 mg | ORAL_TABLET | Freq: Two times a day (BID) | ORAL | Status: DC
  Administered 2023-08-29 – 2023-08-30 (×3): 5 mg via ORAL
  Filled 2023-08-29 (×3): qty 1

## 2023-08-29 MED ORDER — METOPROLOL TARTRATE 50 MG PO TABS: 50.0000 mg | ORAL_TABLET | Freq: Two times a day (BID) | ORAL | Status: DC

## 2023-08-29 MED ORDER — PANTOPRAZOLE SODIUM 40 MG PO TBEC
40.0000 mg | DELAYED_RELEASE_TABLET | Freq: Every day | ORAL | Status: DC
Start: 2023-08-29 — End: 2023-12-24

## 2023-08-29 MED ORDER — AMIODARONE HCL 200 MG PO TABS: ORAL_TABLET | ORAL | Status: DC

## 2023-08-29 MED ORDER — HYDROMORPHONE HCL 1 MG/ML IJ SOLN: 0.5000 mg | INTRAMUSCULAR | Status: DC | PRN

## 2023-08-29 MED ORDER — APIXABAN 5 MG PO TABS: 5.0000 mg | ORAL_TABLET | Freq: Two times a day (BID) | ORAL | Status: DC

## 2023-08-29 MED ORDER — METOPROLOL TARTRATE 50 MG PO TABS
50.0000 mg | ORAL_TABLET | Freq: Two times a day (BID) | ORAL | Status: DC
  Administered 2023-08-30: 50 mg via ORAL
  Filled 2023-08-29: qty 1

## 2023-08-29 MED ORDER — OXYCODONE HCL 5 MG PO TABS
5.0000 mg | ORAL_TABLET | Freq: Four times a day (QID) | ORAL | 0 refills | Status: DC | PRN
Start: 2023-08-29 — End: 2023-09-17

## 2023-08-29 MED ORDER — MELATONIN 3 MG PO TABS: 6.0000 mg | ORAL_TABLET | Freq: Every evening | ORAL | Status: DC | PRN

## 2023-08-29 MED ORDER — ENSURE ENLIVE PO LIQD: 237.0000 mL | Freq: Three times a day (TID) | ORAL | Status: DC

## 2023-08-29 NOTE — Discharge Summary (Addendum)
 Physician Discharge Summary  Dorothy Harris ZOX:[id number] DOB: [birth year] DOA: 08/06/2023  PCP: [name], MD Surgeon: Dr. Collene Dawson  Admit date: 08/06/2023 Discharge date: 08/29/2023  Disposition:  SNF   Recommendations for Outpatient Follow-up:  Follow up with Dr. Collene Dawson as scheduled on 09/08/23  Follow up with [facility] in 3 weeks Follow up with PCP in 2 weeks for hospital follow up and left breast exam Please obtain BMP/CBC in 1-2 weeks to follow up renal function and hemoglobin Amiodarone  instructions: 1 po BID thru 09/17/23, then 1 po daily   Home Health:  SNF  Discharge Condition: STABLE   CODE STATUS: FULL DIET:  soft foods, thin liquids, advance as tolerated    Brief Hospitalization Summary: Please see all hospital notes, images, labs for full details of the hospitalization. 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 EMS was activated and arrival of EMS team, patient was hypotensive with SBP in 70s, IV NS 250 mL bolus was given with SBP improving to 100's In WJ:[id number] stable. Labs-normal CBC, BMP showed AKI bicarb 19,Troponin 93 > 86. CT abdomen and pelvis>>high-grade small bowel obstruction with transition point likely somewhere in the distal third of the ileum.  Patient was given antibiotics IV x1 and placed on fluids, NG tube placement w/ drainage of 650 mL of greenish fluid and admitted.    Hospital Course by listed problems addressed  High-Grade SBO  status post exploratory lap with lysis of adhesions and resection of small bowel with end ileostomy revision 08/20/23  - Appreciate ongoing general surgery management - - see DC instructions from Dr.  Collene Dawson regarding ostomy care - Advanced to soft diet,  TPN completed  - completed IV antibiotics course  - Continue pain management as needed while recovering  - Antiemetics as needed  - Currently on soft diet and tolerating well, NG tube has been removed   AKI ---acute kidney injury on CKD stage IV -due to dehydration/volume depletion in a patient with SBO and n.p.o. status with high output from NG tube-this has resolved - Appreciate nephrology involvement with plans to discontinue further IV fluid on 08/26/23. - Nephrology has signed off - creatinine back to baseline  - recommend rechecking BMP in 1-2 weeks    Acute anemia likely postoperative equilibration-improved - S/p 2 unit PRBC transfusion, with additional 1 unit PRBC ordered 5/22  - Continue to monitor CBC outpatient, recommend checking CBC in 1 week - No overt bleeding noted    Essential hypertension: - discontinued prior to admission amlodipine  and oral hydralazine   - pt is now on metoprolol  50 mg BID    Paroxysmal A-fib with RVR: - Patient was not on anticoagulation PTA - Echo 60-65% - Pt was treated initially with IV amiodarone  drip with intermittent boluses.  - on 5/23 transitioned  to oral amiodarone  200 mg BID x 3 weeks, followed by 200 mg daily  - pt is now on metoprolol  50 mg BID  - discussed with cardiology, remains on IV heparin  infusion, transitioned to oral apixaban 08/29/23   HFpEF with LVEF 60-65% -completed IV fluid course - appears compensated with no s/s of acute decompensation   HLD - Continue home dose atorvastatin    acute urinary  retention - Foley was removed and now voiding    obesity, class II - BMI 35.81    Discharge Diagnoses:  Principal Problem:   Small bowel obstruction (HCC) Active Problems:   Acute kidney injury superimposed on stage 4 chronic kidney disease (HCC)   PAF (paroxysmal atrial fibrillation) (HCC)   Essential hypertension   S/P exploratory laparotomy   Abdominal  pain   Nausea & vomiting   Dehydration   Elevated troponin   Obesity, Class I, BMI 30-34.9   (HFpEF) heart failure with preserved ejection fraction (HCC)   Ischemic colitis (HCC)   Ileostomy in place (HCC)   Parastomal hernia without obstruction or gangrene   Incisional hernia with obstruction but no gangrene   Atrial fibrillation with rapid ventricular response (HCC)   Perforated bowel (HCC)   Mesenteric abscess (HCC)   Persistent atrial fibrillation [facility])   Discharge Instructions: Discharge Instructions     Amb Referral to AFIB Clinic   Complete by: As directed       Allergies as of 08/29/2023       Reactions   Chlorthalidone  Other (See Comments)   ELEVATED KIDNEY FUNCTION         Medication List     STOP taking these medications    amLODipine  10 MG tablet Commonly known as: NORVASC    aspirin  81 MG chewable tablet   carvedilol  12.5 MG tablet Commonly known as: COREG    cyclobenzaprine  5 MG tablet Commonly known as: FLEXERIL    hydrALAZINE  25 MG tablet Commonly known as: APRESOLINE        TAKE these medications    acetaminophen  500 MG tablet Commonly known as: TYLENOL  Take 2 tablets (1,000 mg total) by mouth every 6 (six) hours. What changed:  medication strength how much to take when to take this reasons to take this   amiodarone  200 MG tablet Commonly known as: PACERONE  1 po BID thru 09/17/23 then 1 po daily   apixaban 5 MG Tabs tablet Commonly known as: ELIQUIS Take 1 tablet (5 mg total) by mouth 2 (two) times daily.   atorvastatin  80 MG tablet Commonly known as: LIPITOR  Take 1 tablet (80 mg total) by mouth daily.   feeding supplement Liqd Take 237 mLs by mouth 3 (three) times daily between meals.   FeroSul 325 (65 Fe) MG tablet Generic drug: ferrous sulfate  Take 1 tablet (325 mg total) by mouth daily with breakfast.   melatonin 3 MG Tabs tablet Take 2 tablets (6 mg total) by mouth at bedtime as needed.   metoprolol  tartrate 50 MG  tablet Commonly known as: LOPRESSOR  Take 1 tablet (50 mg total) by mouth 2 (two) times daily.   multivitamin with minerals Tabs tablet Take 1 tablet by mouth daily.   oxyCODONE  5 MG immediate release tablet Commonly known as: Oxy IR/ROXICODONE  Take 1-2 tablets (5-10 mg total) by mouth every 6 (six) hours as needed for up to 5 days for moderate pain (pain score 4-6) or severe pain (pain score 7-10).   pantoprazole  40 MG tablet Commonly known as: Protonix  Take 1 tablet (40 mg total) by mouth daily.   polyethylene glycol 17 g packet Commonly known as: MiraLax  Take 17 g by mouth daily as needed for mild constipation.        Contact information for follow-up providers     Awilda Bogus, MD Follow up on 09/08/2023.   Specialty: General Surgery Why: staple removal, BRING OSTOMY SUPPLIES TO APPOINTMENT Contact information: [address] [name] [address] [phone]  At[facility] Fib [facility] at [facility] A Dept of [facility]. [employer] [employer]. Schedule an appointment as soon as possible for a visit in 3 week(s).   Specialty: Cardiology Why: Hospital Follow Up Contact information: [address] [place]  [address] [phone]        [name], MD. Schedule an appointment as soon as possible for a visit in 2 week(s).   Specialty: Internal Medicine Why: Hospital Follow Up Contact information: [address] [place] [address] [phone]              Contact information for after-discharge care     Destination     [facility] .   Service: Skilled Nursing Contact information: [address] [place]  [address] [phone]                    Allergies  Allergen Reactions   Chlorthalidone  Other (See Comments)    ELEVATED KIDNEY FUNCTION    Allergies as of 08/29/2023       Reactions   Chlorthalidone  Other (See Comments)   ELEVATED KIDNEY FUNCTION          Medication List     STOP taking these medications    amLODipine  10 MG tablet Commonly known as: NORVASC    aspirin  81 MG chewable tablet   carvedilol  12.5 MG tablet Commonly known as: COREG    cyclobenzaprine  5 MG tablet Commonly known as: FLEXERIL    hydrALAZINE  25 MG tablet Commonly known as: APRESOLINE        TAKE these medications    acetaminophen  500 MG tablet Commonly known as: TYLENOL  Take 2 tablets (1,000 mg total) by mouth every 6 (six) hours. What changed:  medication strength how much to take when to take this reasons to take this   amiodarone  200 MG tablet Commonly known as: PACERONE  1 po BID thru 09/17/23 then 1 po daily   apixaban 5 MG Tabs tablet Commonly known as: ELIQUIS Take 1 tablet (5 mg total) by mouth 2 (two) times daily.   atorvastatin  80 MG tablet Commonly known as: LIPITOR  Take 1 tablet (80 mg total) by mouth daily.   feeding supplement Liqd Take 237 mLs by mouth 3 (three) times daily between meals.   FeroSul 325 (65 Fe) MG tablet Generic drug: ferrous sulfate  Take 1 tablet (325 mg total) by mouth daily with breakfast.   melatonin 3 MG Tabs tablet Take 2 tablets (6 mg total) by mouth at bedtime as needed.   metoprolol  tartrate 50 MG tablet Commonly known as: LOPRESSOR  Take 1 tablet (50 mg total) by mouth 2 (two) times daily.   multivitamin with minerals Tabs tablet Take 1 tablet by mouth daily.   oxyCODONE  5 MG immediate release tablet Commonly known as: Oxy IR/ROXICODONE  Take 1-2 tablets (5-10 mg total) by mouth every 6 (six) hours as needed for up to 5 days for moderate pain (pain score 4-6) or severe pain (pain score 7-10).   pantoprazole  40 MG tablet Commonly known as: Protonix  Take 1 tablet (40 mg total) by mouth daily.   polyethylene glycol 17 g packet Commonly known as: MiraLax  Take 17 g by mouth daily as needed for mild constipation.        Procedures/Studies: DG Chest Port 1 View Result Date:  08/20/2023 CLINICAL DATA:  NG placement. EXAM: PORTABLE CHEST 1 VIEW COMPARISON:  Chest radiograph dated 08/12/2023. FINDINGS: Enteric tube extends below the diaphragm into the stomach and fold back  on itself with tip in the region of the proximal stomach. There is shallow inspiration with bibasilar atelectasis. No pneumothorax. Stable cardiac silhouette. Median sternotomy wires and repair of the thoracic aorta. No acute osseous pathology. IMPRESSION: 1. Enteric tube with tip in the proximal stomach. 2. Shallow inspiration with bibasilar atelectasis. Electronically Signed   By: Angus Bark M.D.   On: 08/20/2023 15:36   DG Abd 1 View Result Date: 08/18/2023 CLINICAL DATA:  History of small bowel obstruction status post ileostomy with emesis EXAM: ABDOMEN - 1 VIEW COMPARISON:  CT abdomen and pelvis dated 08/12/2023 FINDINGS: The inferior rectum is not entirely included within the field of view. Gas-filled mildly dilated small bowel loops throughout the abdomen. No free air or pneumatosis. No abnormal radio-opaque calculi or mass effect. No acute or substantial osseous abnormality. The sacrum and coccyx are partially obscured by overlying bowel contents. Partially imaged thoracic aortic stent graft. Median sternotomy wires are nondisplaced. IMPRESSION: Gas-filled mildly dilated small bowel loops throughout the abdomen, which may represent ileus or persistent small bowel obstruction. Electronically Signed   By: Limin  Xu M.D.   On: 08/18/2023 12:32   CT CHEST ABDOMEN PELVIS WO CONTRAST Result Date: 08/12/2023 CLINICAL DATA:  Sepsis.  Small-bowel obstruction. EXAM: CT CHEST, ABDOMEN AND PELVIS WITHOUT CONTRAST TECHNIQUE: Multidetector CT imaging of the chest, abdomen and pelvis was performed following the standard protocol without IV contrast. RADIATION DOSE REDUCTION: This exam was performed according to the departmental dose-optimization program which includes automated exposure control, adjustment of the mA  and/or kV according to patient size and/or use of iterative reconstruction technique. COMPARISON:  Aug 06, 2023. FINDINGS: CT CHEST FINDINGS Cardiovascular: Status post stent graft repair of thoracic aorta. Mild cardiomegaly. No pericardial effusion. Mediastinum/Nodes: Thyroid gland is unremarkable. Distal tip of feeding tube terminates in distal esophagus. No adenopathy. Lungs/Pleura: No pneumothorax is noted. Minimal bibasilar subsegmental atelectasis is noted with probable minimal pleural effusions. Musculoskeletal: No chest wall mass or suspicious bone lesions identified. CT ABDOMEN PELVIS FINDINGS Hepatobiliary: No cholelithiasis or biliary dilatation. Liver is unremarkable on these unenhanced images. Pancreas: Unremarkable. No pancreatic ductal dilatation or surrounding inflammatory changes. Spleen: Normal in size without focal abnormality. Adrenals/Urinary Tract: Adrenal glands are unremarkable. Kidneys are normal, without renal calculi, focal lesion, or hydronephrosis. Bladder is unremarkable. Stomach/Bowel: Stomach is unremarkable. Loop ileostomy is again noted in right lower quadrant. There is a large associated peristomal hernia which contains loops of small bowel, and appears to be resulting in severe obstruction of more proximal small bowel loops. Dilated small bowel loops are also noted within the peristomal hernia itself. Another smaller ventral hernia is noted in central portion of abdomen and periumbilical region with small bowel loops within it, also potentially causing obstruction. No definite colonic dilatation is noted. Vascular/Lymphatic: Aortic atherosclerosis. 5.2 cm infrarenal abdominal aortic aneurysm is noted. No enlarged abdominal or pelvic lymph nodes. Reproductive: Uterus and bilateral adnexa are unremarkable. Other: Minimal free fluid is noted in the pelvis. Musculoskeletal: No acute or significant osseous findings. IMPRESSION: There is again noted severe small bowel obstruction due to  small bowel loops being incarcerated within 2 ventral hernias, including the large peristomal hernia involving loop ileostomy in right lower quadrant, as well as the large periumbilical hernia. 5.2 cm infrarenal abdominal aortic aneurysm. Recommend follow-up CT or MR as appropriate in 6 months and referral to or continued care with vascular specialist. (Ref.: J Vasc Surg. 2018; 67:2-77 and J Am Coll Radiol 2013;10(10):789-794.) Minimal bibasilar subsegmental atelectasis with small  pleural effusions. Aortic Atherosclerosis (ICD10-I70.0). Electronically Signed   By: Rosalene Colon M.D.   On: 08/12/2023 17:44   DG CHEST PORT 1 VIEW Result Date: 08/12/2023 CLINICAL DATA:  Catheter insertion EXAM: PORTABLE CHEST 1 VIEW COMPARISON:  Chest x-ray 08/10/2023 FINDINGS: Right upper extremity PICC terminates in the SVC. There is no pneumothorax. Enteric tube tip is in the distal esophagus 8 cm proximal from the diaphragm. This is unchanged. The cardiomediastinal silhouette is stable. The heart is enlarged. Aortic graft is present as well sternotomy wires. There are bands of atelectasis in the lower lungs. No new focal lung infiltrate, pleural effusion or pneumothorax. IMPRESSION: 1. Right upper extremity PICC terminates in the SVC. No pneumothorax. 2. Enteric tube tip is in the distal esophagus 8 cm proximal from the diaphragm. This is unchanged. Recommend advancement. Electronically Signed   By: Tyron Gallon M.D.   On: 08/12/2023 17:08   US  EKG SITE RITE Result Date: 08/12/2023 If Site Rite image not attached, placement could not be confirmed due to current cardiac rhythm.  ECHOCARDIOGRAM COMPLETE Result Date: 08/12/2023    ECHOCARDIOGRAM REPORT   Patient Name:   LEIDA LUTON [facility] Date of Exam: 08/12/2023 Medical Rec #:  [id number]     Height:       67.0 in Accession #:    [id number]    Weight:       204.8 lb Date of Birth:  [birth year]     BSA:          2.043 m Patient Age:    62 years      BP:           92/63 mmHg Patient  Gender: F             HR:           107 bpm. Exam Location:  [facility] Procedure: 2D Echo, Cardiac Doppler, Color Doppler and Intracardiac            Opacification Agent (Both Spectral and Color Flow Doppler were            utilized during procedure). Indications:    Atrial Fibrillation  History:        Patient has prior history of Echocardiogram examinations.                 NSTEMI, Arrythmias:Atrial Fibrillation; Risk                 Factors:Hypertension.  Sonographer:    Willey Harrier Referring Phys: [id number] [facility]  Sonographer Comments: Technically difficult study due to poor echo windows. Image acquisition challenging due to patient body habitus. IMPRESSIONS  1. Left ventricular ejection fraction, by estimation, is 60 to 65%. The left ventricle has normal function. The left ventricle has no regional wall motion abnormalities. There is severe asymmetric left ventricular hypertrophy of the septal segment. Left  ventricular diastolic function could not be evaluated.  2. Right ventricular systolic function is normal. The right ventricular size is normal. There is normal pulmonary artery systolic pressure.  3. Left atrial size was severely dilated.  4. The mitral valve is normal in structure. Trivial mitral valve regurgitation. No evidence of mitral stenosis.  5. The aortic valve is tricuspid. There is moderate calcification of the aortic valve. Aortic valve regurgitation is not visualized. No aortic stenosis is present.  6. The inferior vena cava is normal in size with greater than 50% respiratory variability, suggesting right atrial pressure of 3 mmHg. FINDINGS  Left Ventricle: Left ventricular ejection fraction, by estimation, is 60 to 65%. The left ventricle has normal function. The left ventricle has no regional wall motion abnormalities. Strain was performed and the global longitudinal strain is indeterminate. The left ventricular internal cavity size was normal in size. There is severe asymmetric  left ventricular hypertrophy of the septal segment. Left ventricular diastolic function could not be evaluated due to atrial fibrillation. Left ventricular diastolic function could not be evaluated. Right Ventricle: The right ventricular size is normal. No increase in right ventricular wall thickness. Right ventricular systolic function is normal. There is normal pulmonary artery systolic pressure. The tricuspid regurgitant velocity is 2.50 m/s, and  with an assumed right atrial pressure of 3 mmHg, the estimated right ventricular systolic pressure is 28.0 mmHg. Left Atrium: Left atrial size was severely dilated. Right Atrium: Right atrial size was normal in size. Pericardium: There is no evidence of pericardial effusion. Mitral Valve: The mitral valve is normal in structure. Trivial mitral valve regurgitation. No evidence of mitral valve stenosis. MV peak gradient, 5.0 mmHg. The mean mitral valve gradient is 2.3 mmHg. Tricuspid Valve: The tricuspid valve is normal in structure. Tricuspid valve regurgitation is trivial. No evidence of tricuspid stenosis. Aortic Valve: The aortic valve is tricuspid. There is moderate calcification of the aortic valve. Aortic valve regurgitation is not visualized. No aortic stenosis is present. Aortic valve peak gradient measures 7.7 mmHg. Pulmonic Valve: The pulmonic valve was normal in structure. Pulmonic valve regurgitation is not visualized. No evidence of pulmonic stenosis. Aorta: The aortic root and ascending aorta are structurally normal, with no evidence of dilitation. Venous: The inferior vena cava is normal in size with greater than 50% respiratory variability, suggesting right atrial pressure of 3 mmHg. IAS/Shunts: No atrial level shunt detected by color flow Doppler. Additional Comments: 3D was performed not requiring image post processing on an independent workstation and was indeterminate.  LEFT VENTRICLE PLAX 2D LVIDd:         3.80 cm LVIDs:         2.40 cm LV PW:          1.30 cm LV IVS:        1.80 cm LVOT diam:     2.10 cm LV SV:         54 LV SV Index:   26 LVOT Area:     3.46 cm  IVC IVC diam: 1.70 cm LEFT ATRIUM              Index        RIGHT ATRIUM           Index LA diam:        4.80 cm  2.35 cm/m   RA Area:     11.10 cm LA Vol (A2C):   63.7 ml  31.18 ml/m  RA Volume:   21.70 ml  10.62 ml/m LA Vol (A4C):   112.0 ml 54.83 ml/m LA Biplane Vol: 86.9 ml  42.54 ml/m  AORTIC VALVE AV Area (Vmax): 2.66 cm AV Vmax:        138.67 cm/s AV Peak Grad:   7.7 mmHg LVOT Vmax:      106.50 cm/s LVOT Vmean:     66.950 cm/s LVOT VTI:       0.156 m  AORTA Ao Root diam: 2.80 cm Ao Asc diam:  3.20 cm MITRAL VALVE                TRICUSPID VALVE MV  Area (PHT): 3.95 cm     TR Peak grad:   25.0 mmHg MV Area VTI:   3.18 cm     TR Vmax:        250.00 cm/s MV Peak grad:  5.0 mmHg MV Mean grad:  2.3 mmHg     SHUNTS MV Vmax:       1.12 m/s     Systemic VTI:  0.16 m MV Vmean:      61.9 cm/s    Systemic Diam: 2.10 cm MV Decel Time: 192 msec MV E velocity: 104.50 cm/s Vishnu Priya Mallipeddi Electronically signed by Lucetta Russel Mallipeddi Signature Date/Time: 08/12/2023/12:50:15 PM    Final    NM Pulmonary Perfusion Result Date: 08/10/2023 CLINICAL DATA:  Chest pain with shortness of breath for a few days. Concern for pulmonary embolism. EXAM: NUCLEAR MEDICINE PERFUSION LUNG SCAN TECHNIQUE: Perfusion images were obtained in multiple projections after intravenous injection of radiopharmaceutical. No ventilation images obtained. RADIOPHARMACEUTICALS:  4.2 mCi Tc-61m MAA IV COMPARISON:  Bilateral lower extremity venous Doppler ultrasound and chest radiographs same date. Chest CTA 10/02/2022 FINDINGS: No focal perfusion defects are demonstrated. The pulmonary perfusion appears within normal limits, without evidence of acute pulmonary embolism. IMPRESSION: No evidence of acute pulmonary embolism on perfusion scintigraphy by PISAPED criteria. Electronically Signed   By: Elmon Hagedorn M.D.   On:  08/10/2023 16:40   DG CHEST PORT 1 VIEW Result Date: 08/10/2023 CLINICAL DATA:  Chest pain. EXAM: PORTABLE CHEST 1 VIEW COMPARISON:  Chest radiograph dated 01/19/2022. FINDINGS: Enteric tube with tip in the region of the GE junction. Recommend further advancing by additional 8 cm. Shallow inspiration with bibasilar atelectasis. No focal consolidation, pleural effusion, pneumothorax. Stable cardiac silhouette. Status post repair of the thoracic aorta median sternotomy wires. No acute osseous pathology. IMPRESSION: 1. Enteric tube with tip in the region of the GE junction. Recommend further advancing by additional 8 cm. 2. Shallow inspiration with bibasilar atelectasis. Electronically Signed   By: Angus Bark M.D.   On: 08/10/2023 14:23   US  Venous Img Lower Bilateral (DVT) Result Date: 08/10/2023 CLINICAL DATA:  Pulmonary emboli EXAM: BILATERAL LOWER EXTREMITY VENOUS DOPPLER ULTRASOUND TECHNIQUE: Gray-scale sonography with compression, as well as color and duplex ultrasound, were performed to evaluate the deep venous system(s) from the level of the common femoral vein through the popliteal and proximal calf veins. COMPARISON:  None Available. FINDINGS: VENOUS Normal compressibility of the common femoral, superficial femoral, and popliteal veins, as well as the visualized calf veins. Visualized portions of profunda femoral vein and great saphenous vein unremarkable. No filling defects to suggest DVT on grayscale or color Doppler imaging. Doppler waveforms show normal direction of venous flow, normal respiratory plasticity and response to augmentation. Limited views of the contralateral common femoral vein are unremarkable. OTHER None. Limitations: none IMPRESSION: Negative. Electronically Signed   By: Reagan Camera M.D.   On: 08/10/2023 12:51   DG Abd 2 Views Result Date: 08/10/2023 CLINICAL DATA:  Follow-up small bowel obstruction. EXAM: ABDOMEN - 2 VIEW COMPARISON:  08/07/23 FINDINGS: Enteric tube tip is  below the level of the GE junction. Signs of previous median sternotomy. 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 Persistent dilated loops of small bowel are identified measuring up to 5.1 cm. Paucity of colonic/rectal gas. No supine evidence for pneumoperitoneum. IMPRESSION: Persistent dilated loops of small bowel compatible with small bowel obstruction. Electronically Signed   By: Kimberley Penman M.D.   On: 08/10/2023 06:50   DG Abd  Portable 1 View Result Date: 08/07/2023 EXAM: 1 VIEW XRAY OF THE ABDOMEN SUPINE 08/07/2023 02:27:00 AM COMPARISON: Earlier today. CLINICAL HISTORY: NG tube placement. NG tube placement IMAGE #2 repositioned NG. FINDINGS: BOWEL: The bowel gas pattern is nonspecific. No bowel obstruction. PERITONEUM AND SOFT TISSUES: No abnormal calcifications. BONES: No acute osseous abnormality. LINES AND TUBES: Enteric tube terminates in the distal gastric antrum. IMPRESSION: 1. Enteric tube terminates in the distal gastric antrum. Electronically signed by: Zadie Herter MD 08/07/2023 02:48 AM EDT RP Workstation: [id number]   DG Abd Portable 1 View Result Date: 08/07/2023 EXAM: 1 VIEW XRAY OF THE ABDOMEN SUPINE 08/07/2023 01:53:00 AM COMPARISON: CT abdomen / pelvis dated 08/06/2023. CLINICAL HISTORY: NG tube placement. FINDINGS: BOWEL: Dilated loops of small bowel in the central abdomen, suggesting small bowel obstruction. PERITONEUM AND SOFT TISSUES: No abnormal calcifications. BONES: No acute osseous abnormality. LINES AND TUBES: Enteric tube terminates in the proximal duodenum. VASCULATURE: Thoracic aortic stent. MEDIASTINUM: Median sternotomy. Postsurgical changes related to prior CABG (coronary artery bypass graft). IMPRESSION: 1. Enteric tube terminates in the proximal duodenum. 2. Dilated loops of small bowel in the central abdomen, suggesting small bowel obstruction. Electronically signed by: Zadie Herter MD 08/07/2023 01:58 AM EDT RP Workstation: [id number]   CT ABDOMEN PELVIS  WO CONTRAST Result Date: 08/07/2023 CLINICAL DATA:  Abdominal hernia with small bowel obstruction. Unable to keep anything down. EXAM: CT ABDOMEN AND PELVIS WITHOUT CONTRAST TECHNIQUE: Multidetector CT imaging of the abdomen and pelvis was performed following the standard protocol without IV contrast. RADIATION DOSE REDUCTION: This exam was performed according to the departmental dose-optimization program which includes automated exposure control, adjustment of the mA and/or kV according to patient size and/or use of iterative reconstruction technique. COMPARISON:  CTA chest, abdomen and pelvis 10/02/2022, CT abdomen and pelvis without contrast 06/01/2021. FINDINGS: Lower chest: Cardiac size is normal. Lung bases are clear with mild chronic elevation of the right hemidiaphragm. Sternotomy sutures are again noted with partially visualized ascending aortic tube graft repair and a partially visualized descending aortic stent graft. Maximum descending aortic caliber 4.6 cm which seems unchanged. Hepatobiliary: Shrinking cyst noted in hepatic segment 1 measuring 1.5 cm, previously 3.1 cm. Otherwise no focal liver abnormality is seen without contrast. No gallstones, gallbladder wall thickening, or biliary dilatation. Pancreas: Partially atrophic. No focal abnormality is seen without contrast. Spleen: Unremarkable. Adrenals/Urinary Tract: Stable 1.8 cm left adrenal genu nodule. No right adrenal abnormality. No contour deforming mass of the kidneys is seen. There is no urinary stone or obstruction. Bladder is unremarkable for the degree of distension. Stomach/Bowel: The stomach is mildly distended with backed up fluid. No wall thickening. High-grade small-bowel obstruction. There is diffuse dilatation up to 5 cm, obstruction point likely somewhere in the distal third of the ileum. The terminal ileal segments are collapsed and contained within a peristomal hernia in the anterolateral right mid abdomen where there is a loop  ileostomy. There has been a prior right hemicolectomy with decompressed Hartmann's pouch beginning in the distal transverse colon. There is a second, larger hernia sac medial to the peristomal hernia with the hernia mouth 5 cm diameter. The transitional segment in this case is draped over the lateral wall of the mouth of this second hernia, best seen on 2:50 and adjacent images. This hernia sac contains dilated as well as decompressed segments and trace fluid, with the hernia mouth extending to the umbilicus. Above this level are several supraumbilical right and left of midline fat hernias but no other hernias  contain bowel. No bowel pneumatosis, free air or portal venous gas are seen Vascular/Lymphatic: Patchy aortoiliac atherosclerosis. 5.2 cm infrarenal fusiform AAA is again noted and is unchanged. No adenopathy. Reproductive: Status post hysterectomy. No adnexal masses. Other: Mild scattered fluid in the mesenteric folds, minimal fluid in hernia sacs. Trace ascites in pelvis. No free air. There is subcutaneous stranding in right upper abdominal wall above the level of hernias, which could be traumatic or due to cellulitis. Small inguinal fat hernias are again noted.  There is no free air. Musculoskeletal: Degenerative changes lumbar spine including advanced facet hypertrophy L4-5, degenerative disc space loss L5-S1 with spondylosis. No acute or other significant osseous findings. Ankylosis right SI joint. IMPRESSION: 1. High-grade small-bowel obstruction with transition point likely somewhere in the distal third of the ileum. 2. The terminal ileal segments are collapsed and contained within a peristomal hernia in the anterolateral right mid abdomen where there is a loop ileostomy. 3. There is a second, larger hernia sac medial to the peristomal hernia with the hernia mouth 5 cm diameter. The transitional segment in this case is draped over the lateral wall of the mouth of this second hernia. 4. No bowel  pneumatosis, free air or portal venous gas are seen. 5. Mild scattered fluid in the mesenteric folds and in both hernia sacs, trace ascites in the pelvis. 6. Subcutaneous stranding in the right upper abdominal wall above the level of hernias, which could be traumatic or due to cellulitis. 7. Aortic atherosclerosis with 5.2 cm infrarenal fusiform AAA, unchanged. Follow-up as indicated. 8. Prior right hemicolectomy with decompressed Hartmann's pouch beginning in the distal transverse colon. 9. Stable 1.8 cm left adrenal genu nodule. Aortic Atherosclerosis (ICD10-I70.0). Electronically Signed   By: Denman Fischer M.D.   On: 08/07/2023 00:19     Subjective: Pt reports feeling better, eager to go to rehab today  Discharge Exam: Vitals:   08/29/23 1036 08/29/23 1130  BP: 137/76 115/81  Pulse: 100 (!) 119  Resp: 20 (!) 21  Temp:  98.2 F (36.8 C)  SpO2: 91% 90%   Vitals:   08/29/23 0754 08/29/23 0942 08/29/23 1036 08/29/23 1130  BP:  106/70 137/76 115/81  Pulse:  (!) 120 100 (!) 119  Resp:   20 (!) 21  Temp: 98.4 F (36.9 C)   98.2 F (36.8 C)  TempSrc:    Oral  SpO2:   91% 90%  Weight:      Height:       General exam: Appears calm and comfortable  Respiratory system: Clear to auscultation. Respiratory effort normal. Cardiovascular system: normal S1 & S2 heard. No JVD, murmurs, rubs, gallops or clicks. No pedal edema. Gastrointestinal system: Abdomen is nondistended, soft and nontender. No organomegaly or masses felt. Normal bowel sounds heard. Central nervous system: Alert and oriented. No focal neurological deficits. Extremities: Symmetric 5 x 5 power. Skin: No rashes, lesions or ulcers. Psychiatry: Judgement and insight appear normal. Mood & affect appropriate.  Breast exam: RN present for exam, mild edema of left breast but no induration, erythema or masses palpated; no drainage seen; left breast is slightly larger than right suspect from dependent edema    The results of  significant diagnostics from this hospitalization (including imaging, microbiology, ancillary and laboratory) are listed below for reference.     Microbiology: Recent Results (from the past 240 hours)  Aerobic/Anaerobic Culture w Gram Stain (surgical/deep wound)     Status: None   Collection Time: 08/20/23 10:11 AM  Specimen: Abdomen; Abscess  Result Value Ref Range Status   Specimen Description   Final    WOUND Performed at [facility], [address], [place], [place] [address]    Special Requests   Final    NONE Performed at [facility], [address], [place], [place] [address]    Gram Stain   Final    FEW WBC PRESENT, PREDOMINANTLY PMN MODERATE GRAM NEGATIVE RODS FEW GRAM POSITIVE COCCI IN PAIRS RARE GRAM POSITIVE RODS Performed at [facility], [address], [place], [place] [address]    Culture   Final    FEW PROTEUS MIRABILIS FEW ESCHERICHIA COLI FEW ENTEROCOCCUS FAECALIS MIXED ANAEROBIC FLORA PRESENT.  CALL LAB IF FURTHER IID REQUIRED.    Report Status 08/25/2023 FINAL  Final   Organism ID, Bacteria PROTEUS MIRABILIS  Final   Organism ID, Bacteria ESCHERICHIA COLI  Final   Organism ID, Bacteria ENTEROCOCCUS FAECALIS  Final      Susceptibility   Escherichia coli - MIC*    AMPICILLIN 8 SENSITIVE Sensitive     CEFEPIME  <=0.12 SENSITIVE Sensitive     CEFTAZIDIME <=1 SENSITIVE Sensitive     CEFTRIAXONE  <=0.25 SENSITIVE Sensitive     CIPROFLOXACIN <=0.25 SENSITIVE Sensitive     GENTAMICIN <=1 SENSITIVE Sensitive     IMIPENEM <=0.25 SENSITIVE Sensitive     TRIMETH/SULFA <=20 SENSITIVE Sensitive     AMPICILLIN/SULBACTAM 4 SENSITIVE Sensitive     PIP/TAZO <=4 SENSITIVE Sensitive ug/mL    * FEW ESCHERICHIA COLI   Enterococcus faecalis - MIC*    AMPICILLIN <=2 SENSITIVE Sensitive     VANCOMYCIN  1 SENSITIVE Sensitive     GENTAMICIN SYNERGY SENSITIVE Sensitive     * FEW ENTEROCOCCUS FAECALIS   Proteus mirabilis - MIC*    AMPICILLIN <=2 SENSITIVE Sensitive      CEFEPIME  <=0.12 SENSITIVE Sensitive     CEFTAZIDIME <=1 SENSITIVE Sensitive     CEFTRIAXONE  <=0.25 SENSITIVE Sensitive     CIPROFLOXACIN <=0.25 SENSITIVE Sensitive     GENTAMICIN <=1 SENSITIVE Sensitive     IMIPENEM 2 SENSITIVE Sensitive     TRIMETH/SULFA <=20 SENSITIVE Sensitive     AMPICILLIN/SULBACTAM <=2 SENSITIVE Sensitive     PIP/TAZO <=4 SENSITIVE Sensitive ug/mL    * FEW PROTEUS MIRABILIS     Labs: BNP (last 3 results) No results for input(s): "BNP" in the last 8760 hours. Basic Metabolic Panel: Recent Labs  Lab 08/23/23 0549 08/24/23 0405 08/25/23 0445 08/26/23 0335 08/27/23 0108 08/28/23 0441  NA 137 136 137 136 134* 138  K 3.7 3.9 4.1 4.6 4.4 4.2  CL 104 105 105 102 103 107  CO2 24 24 27 27 25 25   GLUCOSE 136* 145* 138* 121* 93 99  BUN 95* 84* 77* 67* 58* 47*  CREATININE 3.14* 2.68* 2.38* 2.27* 2.15* 2.19*  CALCIUM  7.9* 7.8* 8.0* 8.2* 8.1* 8.0*  MG 2.1 2.0 2.0 2.1 2.2 2.0  PHOS 4.9* 3.2 2.5 2.7 3.5  --    Liver Function Tests: Recent Labs  Lab 08/23/23 0549 08/24/23 0405 08/25/23 0445 08/26/23 0335 08/27/23 0108  AST  --  10*  --   --  12*  ALT  --  16  --   --  15  ALKPHOS  --  91  --   --  124  BILITOT  --  0.4  --   --  0.7  PROT  --  5.0*  --   --  6.1*  ALBUMIN  1.5* <  1.5* <1.5* 1.6* 1.7*   No results for input(s): "LIPASE", "AMYLASE" in the last 168 hours. No results for input(s): "AMMONIA" in the last 168 hours. CBC: Recent Labs  Lab 08/25/23 0445 08/26/23 0335 08/27/23 0108 08/28/23 0441 08/29/23 0321  WBC 12.0* 11.4* 11.3* 9.9 8.9  HGB 7.9* 8.2* 8.6* 8.4* 8.6*  HCT 25.1* 24.5* 26.3* 27.7* 28.0*  MCV 96.9 96.1 97.0 100.0 101.4*  PLT 295 303 312 289 310   Cardiac Enzymes: No results for input(s): "CKTOTAL", "CKMB", "CKMBINDEX", "TROPONINI" in the last 168 hours. BNP: Invalid input(s): "POCBNP" CBG: Recent Labs  Lab 08/28/23 1124 08/28/23 1648 08/28/23 2127 08/29/23 0753 08/29/23 1129  GLUCAP 123* 123* 91 96 106*    D-Dimer No results for input(s): "DDIMER" in the last 72 hours. Hgb A1c No results for input(s): "HGBA1C" in the last 72 hours. Lipid Profile No results for input(s): "CHOL", "HDL", "LDLCALC", "TRIG", "CHOLHDL", "LDLDIRECT" in the last 72 hours. Thyroid function studies No results for input(s): "TSH", "T4TOTAL", "T3FREE", "THYROIDAB" in the last 72 hours.  Invalid input(s): "FREET3" Anemia work up No results for input(s): "VITAMINB12", "FOLATE", "FERRITIN", "TIBC", "IRON", "RETICCTPCT" in the last 72 hours. Urinalysis    Component Value Date/Time   COLORURINE YELLOW 08/12/2023 1106   APPEARANCEUR HAZY (A) 08/12/2023 1106   LABSPEC 1.015 08/12/2023 1106   PHURINE 5.0 08/12/2023 1106   GLUCOSEU NEGATIVE 08/12/2023 1106   HGBUR NEGATIVE 08/12/2023 1106   BILIRUBINUR NEGATIVE 08/12/2023 1106   KETONESUR 5 (A) 08/12/2023 1106   PROTEINUR 30 (A) 08/12/2023 1106   UROBILINOGEN 0.2 05/26/2008 1446   NITRITE NEGATIVE 08/12/2023 1106   LEUKOCYTESUR NEGATIVE 08/12/2023 1106   Sepsis Labs Recent Labs  Lab 08/26/23 0335 08/27/23 0108 08/28/23 0441 08/29/23 0321  WBC 11.4* 11.3* 9.9 8.9   Microbiology Recent Results (from the past 240 hours)  Aerobic/Anaerobic Culture w Gram Stain (surgical/deep wound)     Status: None   Collection Time: 08/20/23 10:11 AM   Specimen: Abdomen; Abscess  Result Value Ref Range Status   Specimen Description   Final    WOUND Performed at [facility], [address], [place], [place] [address]    Special Requests   Final    NONE Performed at [facility], [address], [place], [place] [address]    Gram Stain   Final    FEW WBC PRESENT, PREDOMINANTLY PMN MODERATE GRAM NEGATIVE RODS FEW GRAM POSITIVE COCCI IN PAIRS RARE GRAM POSITIVE RODS Performed at [facility], [address], [place], [place] [address]    Culture   Final    FEW PROTEUS MIRABILIS FEW ESCHERICHIA COLI FEW ENTEROCOCCUS FAECALIS MIXED ANAEROBIC FLORA PRESENT.   CALL LAB IF FURTHER IID REQUIRED.    Report Status 08/25/2023 FINAL  Final   Organism ID, Bacteria PROTEUS MIRABILIS  Final   Organism ID, Bacteria ESCHERICHIA COLI  Final   Organism ID, Bacteria ENTEROCOCCUS FAECALIS  Final      Susceptibility   Escherichia coli - MIC*    AMPICILLIN 8 SENSITIVE Sensitive     CEFEPIME  <=0.12 SENSITIVE Sensitive     CEFTAZIDIME <=1 SENSITIVE Sensitive     CEFTRIAXONE  <=0.25 SENSITIVE Sensitive     CIPROFLOXACIN <=0.25 SENSITIVE Sensitive     GENTAMICIN <=1 SENSITIVE Sensitive     IMIPENEM <=0.25 SENSITIVE Sensitive     TRIMETH/SULFA <=20 SENSITIVE Sensitive     AMPICILLIN/SULBACTAM 4 SENSITIVE Sensitive     PIP/TAZO <=4 SENSITIVE Sensitive ug/mL    * FEW ESCHERICHIA  COLI   Enterococcus faecalis - MIC*    AMPICILLIN <=2 SENSITIVE Sensitive     VANCOMYCIN  1 SENSITIVE Sensitive     GENTAMICIN SYNERGY SENSITIVE Sensitive     * FEW ENTEROCOCCUS FAECALIS   Proteus mirabilis - MIC*    AMPICILLIN <=2 SENSITIVE Sensitive     CEFEPIME  <=0.12 SENSITIVE Sensitive     CEFTAZIDIME <=1 SENSITIVE Sensitive     CEFTRIAXONE  <=0.25 SENSITIVE Sensitive     CIPROFLOXACIN <=0.25 SENSITIVE Sensitive     GENTAMICIN <=1 SENSITIVE Sensitive     IMIPENEM 2 SENSITIVE Sensitive     TRIMETH/SULFA <=20 SENSITIVE Sensitive     AMPICILLIN/SULBACTAM <=2 SENSITIVE Sensitive     PIP/TAZO <=4 SENSITIVE Sensitive ug/mL    * FEW PROTEUS MIRABILIS   Time coordinating discharge:  40 mins   SIGNED:  Faustino Hook, MD  [facility] 08/29/2023, 3:38 PM How to contact the [facility] Attending or Consulting provider 7A - 7P or covering provider during after hours 7P -7A, for this patient?  Check the care team in [facility] and look for a) attending/consulting [facility] provider listed and b) the [facility] team listed Log into [email or web address] and use [place]'s universal password to access. If you do not have the password, please contact the hospital operator. Locate the [facility] provider you are looking  for under [facility] and page to a number that you can be directly reached. If you still have difficulty reaching the provider, please page the [facility] (Director on Call) for the Hospitalists listed on amion for assistance.

## 2023-08-29 NOTE — Progress Notes (Signed)
 Report called to Rupurt RN at [facility]. Phone number [phone], room [phone].

## 2023-08-29 NOTE — Progress Notes (Signed)
 Physical Therapy Treatment Patient Details Name: Dorothy Harris MRN: [id number] DOB: February 21, 1961 Today's Date: 08/29/2023   History of Present Illness Dorothy Harris is a 63 y.o. 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  Patient states that she has not been able to tolerate any oral intake, she complained of abdominal pain which was cramping in nature.  Colostomy has had only minimal output.  EMS was activated and arrival of EMS team, patient blood pressure was hypotensive with SBP in 70s, IV NS 250 mL bolus was given with SBP improving to 100's.  She was taken to the ED for further evaluation and management.    PT Comments  Patient demonstrates improvement for sitting up at bedside, but c/o discomfort, swelling and pain left breast - RN notified.  Patient demonstrates good sitting balance while completing BLE exercises, very unsteady on feet and limited to a few side steps before requesting to sit due to fatigue and BLE weakness. Patient tolerated sitting up in chair after therapy. Patient will benefit from continued skilled physical therapy in hospital and recommended venue below to increase strength, balance, endurance for safe ADLs and gait.     If plan is discharge home, recommend the following: A lot of help with bathing/dressing/bathroom;A lot of help with walking and/or transfers;Help with stairs or ramp for entrance;Assistance with cooking/housework   Can travel by private vehicle     No  Equipment Recommendations  None recommended by PT    Recommendations for Other Services       Precautions / Restrictions  Precautions Precautions: Fall Recall of Precautions/Restrictions: Intact Restrictions Weight Bearing Restrictions Per Provider Order: No     Mobility  Bed Mobility Overal bed mobility: Needs Assistance Bed Mobility: Supine to Sit     Supine to sit: Min assist     General bed mobility comments: labored movement, increased time    Transfers Overall transfer level: Needs assistance Equipment used: Rolling walker (2 wheels) Transfers: Sit to/from Stand, Bed to chair/wheelchair/BSC Sit to Stand: Mod assist   Step pivot transfers: Mod assist       General transfer comment: unsteady labored movement    Ambulation/Gait Ambulation/Gait assistance: Mod assist, Max assist Gait Distance (Feet): 4 Feet Assistive device: Rolling walker (2 wheels) Gait Pattern/deviations: Decreased step length - right, Decreased step length - left, Decreased stride length, Knees buckling Gait velocity: slow     General Gait Details: limited to a few side steps at bedside, unable to take steps forward due to c/o weakness and poor standing balance   Stairs             Wheelchair Mobility     Tilt Bed    Modified Rankin (Stroke Patients Only)       Balance Overall balance assessment: Needs assistance Sitting-balance support: Feet supported, No upper extremity supported Sitting balance-Leahy Scale: Fair Sitting balance - Comments: fair/good seated at EOB   Standing balance support: Reliant on assistive device for balance, During functional activity, Bilateral upper extremity supported Standing balance-Leahy Scale: Poor Standing balance comment: using RW  Communication Communication Communication: No apparent difficulties  Cognition Arousal: Alert Behavior During Therapy: WFL for tasks assessed/performed   PT - Cognitive impairments: No apparent impairments                         Following commands: Intact      Cueing  Cueing Techniques: Verbal cues, Tactile cues  Exercises General Exercises - Lower Extremity Long Arc Quad: Seated, AROM, Strengthening, Both, 10 reps Hip Flexion/Marching: Seated, AROM, Strengthening, Both, 10 reps Toe Raises: Seated, AROM, Strengthening, Both, 10 reps Heel Raises: Seated, AROM, Strengthening, Both, 10 reps    General Comments        Pertinent Vitals/Pain Pain Assessment Pain Assessment: Faces Faces Pain Scale: Hurts little more Pain Location: Left breast swelling, abdominal discomfort at site of surgery Pain Descriptors / Indicators: Sore, Grimacing, Discomfort Pain Intervention(s): Limited activity within patient's tolerance, Monitored during session, Repositioned    Home Living                          Prior Function            PT Goals (current goals can now be found in the care plan section) Acute Rehab PT Goals Patient Stated Goal: Return home with family to assist PT Goal Formulation: With patient Time For Goal Achievement: 09/02/23 Potential to Achieve Goals: Good Progress towards PT goals: Progressing toward goals    Frequency    Min 3X/week      PT Plan      Co-evaluation              AM-PAC PT "6 Clicks" Mobility   Outcome Measure  Help needed turning from your back to your side while in a flat bed without using bedrails?: A Little Help needed moving from lying on your back to sitting on the side of a flat bed without using bedrails?: A Little Help needed moving to and from a bed to a chair (including a wheelchair)?: A Lot Help needed standing up from a chair using your arms (e.g., wheelchair or bedside chair)?: A Lot Help needed to walk in hospital room?: A Lot Help needed climbing 3-5 steps with a railing? : Total 6 Click Score: 13    End of Session   Activity Tolerance: Patient tolerated treatment well;Patient limited by fatigue Patient left: in chair;with call bell/phone within reach Nurse Communication:  Mobility status PT Visit Diagnosis: Unsteadiness on feet (R26.81);Other abnormalities of gait and mobility (R26.89);Muscle weakness (generalized) (M62.81)     Time: 1610-9604 PT Time Calculation (min) (ACUTE ONLY): 28 min  Charges:    $Therapeutic Exercise: 8-22 mins $Therapeutic Activity: 8-22 mins PT General Charges $$ ACUTE PT VISIT: 1 Visit                     1:03 PM, 08/29/23 Walton Guppy, MPT Physical Therapist with [facility] [phone] office [phone] mobile phone

## 2023-08-29 NOTE — Progress Notes (Signed)
 PHARMACY - ANTICOAGULATION CONSULT NOTE  Pharmacy Consult for heparin -> eliquis Indication: atrial fibrillation  Allergies  Allergen Reactions   Chlorthalidone  Other (See Comments)    ELEVATED KIDNEY FUNCTION     Patient Measurements: Height: 5\' 7"  (170.2 cm) Weight: 114 kg (251 lb 5.2 oz) IBW/kg (Calculated) : 61.6 HEPARIN  DW (KG): 85  Vital Signs: Temp: 98.4 F (36.9 C) (05/24 0754) BP: 106/70 (05/24 0942) Pulse Rate: 120 (05/24 0942)  Labs: Recent Labs    08/27/23 0108 08/27/23 0948 08/28/23 0441 08/28/23 1649 08/29/23 0117 08/29/23 0321  HGB 8.6*  --  8.4*  --   --  8.6*  HCT 26.3*  --  27.7*  --   --  28.0*  PLT 312  --  289  --   --  310  HEPARINUNFRC 0.58   < > >1.10* >1.10* 0.13*  --   CREATININE 2.15*  --  2.19*  --   --   --    < > = values in this interval not displayed.    Estimated Creatinine Clearance: 34.7 mL/min (A) (by C-G formula based on SCr of 2.19 mg/dL (H)).   Medical History: 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HCC)    Anemia    Chronic kidney disease    Essential hypertension 05/07/2021   GERD (gastroesophageal reflux disease)    Hernia, epigastric    Hypertension    PAF (paroxysmal atrial fibrillation) (HCC) 05/07/2021   Pure hypercholesterolemia 05/07/2021   Assessment: Pharmacy consulted to dose heparin  in patient with new onset atrial fibrillation.  She has been on heparin  prophylaxis while inpatient with last dose 5/21 0514.  Transition to po eliquis for afib  Goal of Therapy:  Heparin  level 0.3-0.7 units/ml Monitor platelets by anticoagulation protocol: Yes   Plan:  D/c heparin  Eliquis 5mg  po bid Educate on eliquis Continue to monitor H&H and platelets  Kensleigh Gates, BS Pharm D, BCPS Clinical Pharmacist 08/29/2023 10:03 AM

## 2023-08-29 NOTE — TOC Transition Note (Signed)
 Transition of Care [facility]) - Discharge Note   Patient Details  Name: Dorothy Harris MRN: [id number] Date of Birth: 12-15-1960  Transition of [facility] CM/SW Contact:  Lynda Sands, RN Phone Number: 08/29/2023, 3:46 PM   Clinical Narrative:   Patient transferred to [facility] and rehab.    Barriers to Discharge: Continued Medical Work up, English as a second language teacher   Patient Goals and CMS Choice Patient states their goals for this hospitalization and ongoing recovery are:: return home CMS Medicare.gov Compare Post Acute Care list provided to:: Patient Choice offered to / list presented to : Patient      Discharge Placement   [facility].  Discharge Plan and Services Additional resources added to the After Visit Summary for   In-house Referral: Clinical Social Work Discharge Planning Services: CM Consult                  Social Drivers of Health (SDOH) Interventions SDOH Screenings   Food Insecurity: Food Insecurity Present (08/07/2023)  Housing: High Risk (08/07/2023)  Transportation Needs: No Transportation Needs (08/07/2023)  Utilities: Not At Risk (08/07/2023)  Alcohol Screen: Low Risk  (05/07/2021)  Depression (PHQ2-9): Medium Risk (05/05/2023)  Financial Resource Strain: Low Risk  (05/07/2021)  Physical Activity: Inactive (05/07/2021)  Tobacco Use: High Risk (08/20/2023)     Readmission Risk Interventions    08/11/2023    8:27 AM 08/07/2023    7:29 AM 01/22/2022   12:57 PM  Readmission Risk Prevention Plan  Medication Screening  Complete   Transportation Screening Complete Complete Complete  PCP or Specialist Appt within 3-5 Days   Complete  HRI or Home Care Consult Complete  Complete  Social Work Consult for Recovery Care Planning/Counseling Complete  Complete  Palliative Care Screening Not Applicable  Not Applicable  Medication Review Oceanographer) Complete  Referral to Pharmacy

## 2023-08-29 NOTE — Progress Notes (Signed)
 PHARMACY - ANTICOAGULATION CONSULT NOTE  Pharmacy Consult for heparin  Indication: atrial fibrillation  Allergies  Allergen Reactions   Chlorthalidone  Other (See Comments)    ELEVATED KIDNEY FUNCTION     Patient Measurements: Height: 5\' 7"  (170.2 cm) Weight: 113.5 kg (250 lb 3.6 oz) IBW/kg (Calculated) : 61.6 HEPARIN  DW (KG): 85  Vital Signs: Temp: 98.6 F (37 C) (05/23 1917) Temp Source: Oral (05/23 1917) BP: 121/103 (05/24 0100) Pulse Rate: 110 (05/24 0100)  Labs: Recent Labs    08/26/23 0335 08/26/23 1833 08/27/23 0108 08/27/23 0948 08/28/23 0441 08/28/23 1649 08/29/23 0117  HGB 8.2*  --  8.6*  --  8.4*  --   --   HCT 24.5*  --  26.3*  --  27.7*  --   --   PLT 303  --  312  --  289  --   --   HEPARINUNFRC  --    < > 0.58   < > >1.10* >1.10* 0.13*  CREATININE 2.27*  --  2.15*  --  2.19*  --   --    < > = values in this interval not displayed.    Estimated Creatinine Clearance: 34.6 mL/min (A) (by C-G formula based on SCr of 2.19 mg/dL (H)).   Medical History: 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HCC)    Anemia    Chronic kidney disease    Essential hypertension 05/07/2021   GERD (gastroesophageal reflux disease)    Hernia, epigastric    Hypertension    PAF (paroxysmal atrial fibrillation) (HCC) 05/07/2021   Pure hypercholesterolemia 05/07/2021   Assessment: Pharmacy consulted to dose heparin  in patient with new onset atrial fibrillation.  She has been on heparin  prophylaxis while inpatient with last dose 5/21 0514.  HL 0.13 after holding for 1 hour and decreased rate to 1000 units/hr. Heparin  charted to be in PICC and labs drawn from other lumen. Per RN, no signs/symptoms of bleeding or issues running heparin  continuously. Will have heparin  moved to run through peripheral line and lab draws to be from PICC.  Goal of Therapy:  Heparin  level 0.3-0.7 units/ml Monitor platelets by anticoagulation protocol: Yes   Plan:  Increase  heparin  infusion to 1200 units/hr Check anti-Xa level in 6-8 and daily Continue to monitor H&H and platelets F/U transition to DOAC  Young Hensen, PharmD, BCPS Clinical Pharmacist 08/29/2023 1:46 AM

## 2023-08-29 NOTE — Progress Notes (Signed)
 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 EMS was activated and arrival of EMS team, patient was hypotensive with SBP in 70s, IV NS 250 mL bolus was given with SBP improving to 100's In WJ:[id number] stable. Labs-normal CBC, BMP showed AKI bicarb 19,Troponin 93 > 86. CT abdomen and pelvis>>high-grade small bowel obstruction with transition point likely somewhere in the distal third of the ileum.  Patient was given antibiotics IV x1 and placed on fluids, NG tube placement w/ drainage of 650 mL of greenish fluid and admitted.     Assessment and Plan:  High-Grade SBO status post exploratory lap with lysis of adhesions and resection of small bowel with end ileostomy revision 5/15 - Appreciate ongoing general surgery management  - Advance to soft diet,  TPN completed  - completed IV antibiotics course  - Continue pain management as needed while recovering  - Antiemetics as needed  - Currently on soft diet, NG tube has been removed   AKI ---acute kidney injury on CKD stage IV -due to dehydration/volume depletion in a patient with SBO and n.p.o. status with high output from NG tube-this has resolved - Appreciate nephrology involvement with plans to discontinue further IV fluid on 08/26/23. - Nephrology has signed off - creatinine back to baseline     Acute anemia likely postoperative equilibration-improved - S/p 2 unit PRBC transfusion, with additional 1 unit PRBC ordered 5/22  -  Continue to monitor CBC - No overt bleeding noted    Essential hypertension: - c/n to Hold PTA amlodipine  and oral hydralazine   - Continue to follow   Paroxysmal A-fib with RVR: - Patient was not on anticoagulation PTA - Echo 60-65% - Pt was treated initially with IV amiodarone  drip with intermittent boluses.  - on 5/23 transitioned  to oral amiodarone  200 mg BID x 3 weeks, followed by 200 mg daily  - discussed with cardiology, remains on IV heparin  infusion, transitioned to oral apixaban 08/29/23   HFpEF with LVEF 60-65% -completed IV fluid course -continue to follow I's and O's   HLD - Continue home dose atorvastatin    acute urinary retention - Foley was removed and now voiding    obesity, class II - BMI 35.81   DVT prophylaxis: IV heparin  infusion  Code Status: Full Family Communication:  Disposition: anticipating SNF placement when insurance authorizes   Consultants:  Surgery  Cardiology  Procedures:   Antimicrobials:    Subjective: Pt had reported to RN that left breast was swollen.     Objective: Vitals:   08/29/23 0754 08/29/23 0942 08/29/23 1036 08/29/23 1130  BP:  106/70 137/76 115/81  Pulse:  (!) 120 100 (!) 119  Resp:   20 (!) 21  Temp: 98.4 F (36.9 C)   98.2 F (36.8 C)  TempSrc:    Oral  SpO2:   91% 90%  Weight:      Height:        Intake/Output Summary (Last 24 hours) at 08/29/2023 1314 Last data  filed at 08/29/2023 0943 Gross per 24 hour  Intake 848.64 ml  Output 1780 ml  Net -931.36 ml   Filed Weights   08/27/23 0500 08/28/23 0430 08/29/23 0500  Weight: 113.7 kg 113.5 kg 114 kg   Examination:  General exam: Appears calm and comfortable  Respiratory system: Clear to auscultation. Respiratory effort normal. Cardiovascular system: normal S1 & S2 heard. No JVD, murmurs, rubs, gallops or clicks. No pedal edema. Gastrointestinal system: Abdomen is nondistended, soft and nontender. No organomegaly or masses felt. Normal bowel sounds  heard. Central nervous system: Alert and oriented. No focal neurological deficits. Extremities: Symmetric 5 x 5 power. Skin: No rashes, lesions or ulcers. Psychiatry: Judgement and insight appear normal. Mood & affect appropriate.  Breast exam: RN present for exam, mild edema of left breast but no induration, erythema or masses palpated; no drainage seen; left breast is slightly larger than right suspect from dependent edema   Data Reviewed: I have personally reviewed following labs and imaging studies  CBC: Recent Labs  Lab 08/25/23 0445 08/26/23 0335 08/27/23 0108 08/28/23 0441 08/29/23 0321  WBC 12.0* 11.4* 11.3* 9.9 8.9  HGB 7.9* 8.2* 8.6* 8.4* 8.6*  HCT 25.1* 24.5* 26.3* 27.7* 28.0*  MCV 96.9 96.1 97.0 100.0 101.4*  PLT 295 303 312 289 310    Basic Metabolic Panel: Recent Labs  Lab 08/23/23 0549 08/24/23 0405 08/25/23 0445 08/26/23 0335 08/27/23 0108 08/28/23 0441  NA 137 136 137 136 134* 138  K 3.7 3.9 4.1 4.6 4.4 4.2  CL 104 105 105 102 103 107  CO2 24 24 27 27 25 25   GLUCOSE 136* 145* 138* 121* 93 99  BUN 95* 84* 77* 67* 58* 47*  CREATININE 3.14* 2.68* 2.38* 2.27* 2.15* 2.19*  CALCIUM  7.9* 7.8* 8.0* 8.2* 8.1* 8.0*  MG 2.1 2.0 2.0 2.1 2.2 2.0  PHOS 4.9* 3.2 2.5 2.7 3.5  --     CBG: Recent Labs  Lab 08/28/23 1124 08/28/23 1648 08/28/23 2127 08/29/23 0753 08/29/23 1129  GLUCAP 123* 123* 91 96 106*    Recent Results (from the past 240 hours)  Aerobic/Anaerobic Culture w Gram Stain (surgical/deep wound)     Status: None   Collection Time: 08/20/23 10:11 AM   Specimen: Abdomen; Abscess  Result Value Ref Range Status   Specimen Description   Final    WOUND Performed at [facility], [address], [place], [place] [address]    Special Requests   Final    NONE Performed at [facility], [address] [place], [place] [address]    Gram Stain   Final    FEW WBC PRESENT, PREDOMINANTLY PMN MODERATE GRAM NEGATIVE RODS FEW GRAM POSITIVE COCCI IN  PAIRS RARE GRAM POSITIVE RODS Performed at [facility], [address] [place], [place] [address]    Culture   Final    FEW PROTEUS MIRABILIS FEW ESCHERICHIA COLI FEW ENTEROCOCCUS FAECALIS MIXED ANAEROBIC FLORA PRESENT.  CALL LAB IF FURTHER IID REQUIRED.    Report Status 08/25/2023 FINAL  Final   Organism ID, Bacteria PROTEUS MIRABILIS  Final   Organism ID, Bacteria ESCHERICHIA COLI  Final   Organism ID, Bacteria ENTEROCOCCUS FAECALIS  Final      Susceptibility   Escherichia coli - MIC*    AMPICILLIN 8 SENSITIVE Sensitive     CEFEPIME  <=0.12 SENSITIVE Sensitive     CEFTAZIDIME <=1 SENSITIVE Sensitive     CEFTRIAXONE  <=0.25 SENSITIVE Sensitive     CIPROFLOXACIN <=0.25 SENSITIVE Sensitive  GENTAMICIN <=1 SENSITIVE Sensitive     IMIPENEM <=0.25 SENSITIVE Sensitive     TRIMETH/SULFA <=20 SENSITIVE Sensitive     AMPICILLIN/SULBACTAM 4 SENSITIVE Sensitive     PIP/TAZO <=4 SENSITIVE Sensitive ug/mL    * FEW ESCHERICHIA COLI   Enterococcus faecalis - MIC*    AMPICILLIN <=2 SENSITIVE Sensitive     VANCOMYCIN  1 SENSITIVE Sensitive     GENTAMICIN SYNERGY SENSITIVE Sensitive     * FEW ENTEROCOCCUS FAECALIS   Proteus mirabilis - MIC*    AMPICILLIN <=2 SENSITIVE Sensitive     CEFEPIME  <=0.12 SENSITIVE Sensitive     CEFTAZIDIME <=1 SENSITIVE Sensitive     CEFTRIAXONE  <=0.25 SENSITIVE Sensitive     CIPROFLOXACIN <=0.25 SENSITIVE Sensitive     GENTAMICIN <=1 SENSITIVE Sensitive     IMIPENEM 2 SENSITIVE Sensitive     TRIMETH/SULFA <=20 SENSITIVE Sensitive     AMPICILLIN/SULBACTAM <=2 SENSITIVE Sensitive     PIP/TAZO <=4 SENSITIVE Sensitive ug/mL    * FEW PROTEUS MIRABILIS     Radiology Studies: No results found.  Scheduled Meds:  acetaminophen   1,000 mg Oral Q6H   amiodarone   200 mg Oral BID   apixaban  5 mg Oral BID   atorvastatin   80 mg Oral QPM   Chlorhexidine  Gluconate Cloth  6 each Topical Daily   feeding supplement  237 mL Oral TID BM   insulin  aspart  0-15  Units Subcutaneous TID WC & HS   metoprolol  tartrate  25 mg Oral QID   [START ON 08/30/2023] metoprolol  tartrate  50 mg Oral BID   pantoprazole  (PROTONIX ) IV  40 mg Intravenous Q12H   sodium chloride  flush  10-40 mL Intracatheter Q12H   Continuous Infusions:   LOS: 22 days   Time spent: 55 mins  Akil Hoos Lincoln Renshaw, MD How to contact the [facility] Attending or Consulting provider 7A - 7P or covering provider during after hours 7P -7A, for this patient?  Check the care team in [facility] and look for a) attending/consulting TRH provider listed and b) the TRH team listed Log into [email or web address] to find provider on call.  Locate the TRH provider you are looking for under [facility] and page to a number that you can be directly reached. If you still have difficulty reaching the provider, please page the [facility] (Director on Call) for the Hospitalists listed on amion for assistance.  08/29/2023, 1:14 PM

## 2023-08-29 NOTE — Plan of Care (Signed)

## 2023-08-29 NOTE — NC FL2 (Signed)
 [place]  MEDICAID FL2 LEVEL OF CARE FORM     IDENTIFICATION  Patient Name: Dorothy Harris Birthdate: Sep 05, 1960 Sex: female Admission Date (Current Location): 08/06/2023  [facility] and [place] Number:  [employer] and Address:  [facility],  [address], [place] Harris [address]      Provider Number: [id number]  Attending Physician Name and Address:  Rayfield Cairo, MD  Relative Name and Phone Number:       Current Level of Care: Hospital Recommended Level of Care: Skilled Nursing Facility Prior Approval Number:    Date Approved/Denied:   PASRR Number:    Discharge Plan: SNF    Current Diagnoses: Patient Active Problem List   Diagnosis Date Noted   Persistent atrial fibrillation (HCC) 08/27/2023   Perforated bowel (HCC) 08/20/2023   Mesenteric abscess (HCC) 08/20/2023   Atrial fibrillation with rapid ventricular response (HCC) 08/17/2023   Small bowel obstruction (HCC) 08/07/2023   Abdominal pain 08/07/2023   Nausea & vomiting 08/07/2023   Dehydration 08/07/2023   Elevated troponin 08/07/2023   Obesity, Class I, BMI 30-34.9 08/07/2023   (HFpEF) heart failure with preserved ejection fraction (HCC) 08/07/2023   Ischemic colitis (HCC) 08/07/2023   Ileostomy in place (HCC) 08/07/2023   Parastomal hernia without obstruction or gangrene 08/07/2023   Incisional hernia with obstruction but no gangrene 08/07/2023   Dyspnea on exertion 11/19/2022   Vitamin D  deficiency 05/29/2022   Abdominal aortic aneurysm (AAA) (HCC) 05/08/2022   Iron deficiency 05/08/2022   Acute lumbar back pain 05/08/2022   Anemia of chronic renal failure 03/20/2022   Hospital discharge follow-up 02/20/2022   Anemia 01/29/2022   History of pneumonia 01/29/2022   Hyperglycemia 01/29/2022   Impaired mobility and ADLs 01/29/2022   Incomplete paraplegia (HCC) 01/29/2022   Wound of left groin 01/29/2022   Stage 4 chronic kidney disease (HCC)    Leukocytosis    Leg weakness,  bilateral 01/17/2022   Thoracic aortic aneurysm (HCC) 01/15/2022   Annual physical exam 11/05/2021   Depression, major, single episode, complete remission (HCC) 06/21/2021   S/P exploratory laparotomy 06/21/2021   Mass of right breast 06/21/2021   Disability examination 06/21/2021   Pressure injury of skin 06/02/2021   Hyperkalemia 06/01/2021   Acute hyperkalemia 06/01/2021   PAF (paroxysmal atrial fibrillation) (HCC) 05/07/2021   Essential hypertension 05/07/2021   Pure hypercholesterolemia 05/07/2021   Debility 04/15/2021   Malnutrition of moderate degree 04/03/2021   Encounter for postanesthesia care 04/02/2021   Peritonitis (HCC) 04/02/2021   Endotracheally intubated 04/02/2021   Acute kidney injury superimposed on stage 4 chronic kidney disease (HCC) 04/02/2021   S/P ascending aortic replacement 03/25/2021   Encounter for weaning from ventilator [facility])    Status post surgery 03/24/2021   Tobacco abuse 07/30/2017   History of drug use 07/30/2017   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/2017   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   Chest pain 05/31/2017   Coronary artery disease due to lipid rich plaque    Acute on chronic diastolic CHF (congestive heart failure) (HCC)    Acute coronary syndrome (HCC) 12/23/2015   NSTEMI (non-ST elevated myocardial infarction) (HCC) 12/22/2015    Orientation RESPIRATION BLADDER Height & Weight     Self, Time, Situation, Place  Normal Incontinent Weight: 114 kg Height:  5\' 7"  (170.2 cm)  BEHAVIORAL SYMPTOMS/MOOD NEUROLOGICAL BOWEL NUTRITION STATUS      Continent Diet (See D/C summary)  AMBULATORY STATUS COMMUNICATION OF NEEDS Skin  Extensive Assist Verbally Normal                       Personal Care Assistance Level of Assistance  Bathing, Feeding, Dressing Bathing Assistance: Limited assistance Feeding assistance: Independent Dressing Assistance: Limited assistance     Functional Limitations Info   Sight, Speech, Hearing Sight Info: Adequate Hearing Info: Adequate Speech Info: Adequate    SPECIAL CARE FACTORS FREQUENCY  PT (By licensed PT), OT (By licensed OT)     PT Frequency: 5 times weekly OT Frequency: 5 times weekly            Contractures Contractures Info: Not present    Additional Factors Info  Code Status, Allergies Code Status Info: FULL Allergies Info: Chlorthalidone            Current Medications (08/29/2023):  This is the current hospital active medication list Current Facility-Administered Medications  Medication Dose Route Frequency Provider Last Rate Last Admin   acetaminophen  (TYLENOL ) tablet 1,000 mg  1,000 mg Oral Q6H Pappayliou, Catherine A, DO   1,000 mg at 08/29/23 1253   amiodarone  (PACERONE ) tablet 200 mg  200 mg Oral BID Laurann Pollock, MD   200 mg at 08/29/23 1610   apixaban  (ELIQUIS ) tablet 5 mg  5 mg Oral BID Johnson, Clanford L, MD   5 mg at 08/29/23 1034   atorvastatin  (LIPITOR ) tablet 80 mg  80 mg Oral QPM Johnson, Clanford L, MD   80 mg at 08/28/23 1743   Chlorhexidine  Gluconate Cloth 2 % PADS 6 each  6 each Topical Daily Awilda Bogus, MD   6 each at 08/29/23 0942   feeding supplement (ENSURE ENLIVE / ENSURE PLUS) liquid 237 mL  237 mL Oral TID BM Mason Sole, Pratik D, DO   237 mL at 08/28/23 1432   hydrALAZINE  (APRESOLINE ) injection 10 mg  10 mg Intravenous Q6H PRN Awilda Bogus, MD       HYDROmorphone  (DILAUDID ) injection 0.5 mg  0.5 mg Intravenous Q4H PRN Awilda Bogus, MD       insulin  aspart (novoLOG ) injection 0-15 Units  0-15 Units Subcutaneous TID WC & HS Johnson, Clanford L, MD   2 Units at 08/28/23 1744   melatonin tablet 6 mg  6 mg Oral QHS PRN Adefeso, Oladapo, DO   6 mg at 08/28/23 2129   metoprolol  tartrate (LOPRESSOR ) tablet 25 mg  25 mg Oral QID Johnson, Clanford L, MD   25 mg at 08/29/23 1407   [START ON 08/30/2023] metoprolol  tartrate (LOPRESSOR ) tablet 50 mg  50 mg Oral BID Johnson, Clanford L, MD        oxyCODONE  (Oxy IR/ROXICODONE ) immediate release tablet 5-10 mg  5-10 mg Oral Q4H PRN Awilda Bogus, MD   10 mg at 08/29/23 9604   pantoprazole  (PROTONIX ) injection 40 mg  40 mg Intravenous Q12H Awilda Bogus, MD   40 mg at 08/29/23 0942   phenol (CHLORASEPTIC) mouth spray 1 spray  1 spray Mouth/Throat PRN Awilda Bogus, MD   1 spray at 08/14/23 2140   prochlorperazine  (COMPAZINE ) injection 10 mg  10 mg Intravenous Q6H PRN Awilda Bogus, MD   10 mg at 08/19/23 1710   sodium chloride  flush (NS) 0.9 % injection 10-40 mL  10-40 mL Intracatheter Q12H Awilda Bogus, MD   10 mL at 08/29/23 0943   sodium chloride  flush (NS) 0.9 % injection 10-40 mL  10-40 mL Intracatheter PRN Awilda Bogus, MD  Discharge Medications: Allergies as of 08/29/2023       Reactions   Chlorthalidone  Other (See Comments)   ELEVATED KIDNEY FUNCTION         Medication List     STOP taking these medications    amLODipine  10 MG tablet Commonly known as: NORVASC    aspirin  81 MG chewable tablet   carvedilol  12.5 MG tablet Commonly known as: COREG    cyclobenzaprine  5 MG tablet Commonly known as: FLEXERIL    hydrALAZINE  25 MG tablet Commonly known as: APRESOLINE        TAKE these medications    acetaminophen  500 MG tablet Commonly known as: TYLENOL  Take 2 tablets (1,000 mg total) by mouth every 6 (six) hours. What changed:  medication strength how much to take when to take this reasons to take this   amiodarone  200 MG tablet Commonly known as: PACERONE  1 po BID thru 09/17/23 then 1 po daily   apixaban 5 MG Tabs tablet Commonly known as: ELIQUIS Take 1 tablet (5 mg total) by mouth 2 (two) times daily.   atorvastatin  80 MG tablet Commonly known as: LIPITOR  Take 1 tablet (80 mg total) by mouth daily.   feeding supplement Liqd Take 237 mLs by mouth 3 (three) times daily between meals.   FeroSul 325 (65 Fe) MG tablet Generic drug: ferrous sulfate  Take 1 tablet  (325 mg total) by mouth daily with breakfast.   melatonin 3 MG Tabs tablet Take 2 tablets (6 mg total) by mouth at bedtime as needed.   metoprolol  tartrate 50 MG tablet Commonly known as: LOPRESSOR  Take 1 tablet (50 mg total) by mouth 2 (two) times daily.   multivitamin with minerals Tabs tablet Take 1 tablet by mouth daily.   oxyCODONE  5 MG immediate release tablet Commonly known as: Oxy IR/ROXICODONE  Take 1-2 tablets (5-10 mg total) by mouth every 6 (six) hours as needed for up to 5 days for moderate pain (pain score 4-6) or severe pain (pain score 7-10).   pantoprazole  40 MG tablet Commonly known as: Protonix  Take 1 tablet (40 mg total) by mouth daily.   polyethylene glycol 17 g packet Commonly known as: MiraLax  Take 17 g by mouth daily as needed for mild constipation.        Relevant Imaging Results:  Relevant Lab Results:   Additional Information SSN: [id number]  Lynda Sands, [place]

## 2023-08-29 NOTE — Progress Notes (Signed)
 [facility]  Transferring rooms today. Eating and having ostomy output.   H&H stable. Starting eliquis.  Left arm less swollen but left breast more swollen. Dr Lincoln Renshaw saw today and will plan to monitor.  BP 137/76   Pulse 100   Temp 98.4 F (36.9 C)   Resp 20   Ht 5\' 7"  (1.702 m)   Wt 114 kg   SpO2 91%   BMI 39.36 kg/m    Will check on patient tomorrow.  SNF pending  Extended out dilaudid  to q 4 as she will not have this at dc    Future Appointments  Date Time Provider Department Center  09/08/2023 11:45 AM Awilda Bogus, MD [facility] None    Deena Farrier, MD [facility] [address] [place], [place] [address] [phone] (office)

## 2023-08-30 LAB — GLUCOSE, CAPILLARY: Glucose-Capillary: 91 mg/dL (ref 70–99)

## 2023-08-30 NOTE — Progress Notes (Signed)
 [facility]   Going today if transport comes. No complaints.  BP (!) 128/90 (BP Location: Left Leg)   Pulse 100   Temp 98.4 F (36.9 C) (Oral)   Resp 20   Ht 5\' 7"  (1.702 m)   Wt 103.9 kg   SpO2 90%   BMI 35.88 kg/m   Ostomy pink with stool in bag, midline wth staples and no drainage. Left breast with some edema but no redness and no tenderness, edema dependent from breast swinging off to her side   Patient s/p end ileostomy revision, SBR and hernia repair for SBO. Doing well.   SNF today Will see in office  Future Appointments  Date Time Provider Department Center  09/08/2023 11:45 AM Awilda Bogus, MD [facility] None   Deena Farrier, MD [facility] [address] [place], [place] [address] [phone] (office)

## 2023-08-30 NOTE — Progress Notes (Signed)
 Report called to Rupurt at [facility]

## 2023-08-30 NOTE — Plan of Care (Signed)
  Problem: Education: Goal: Knowledge of General Education information will improve Description: Including pain rating scale, medication(s)/side effects and non-pharmacologic comfort measures Outcome: Adequate for Discharge   Problem: Clinical Measurements: Goal: Ability to maintain clinical measurements within normal limits will improve Outcome: Adequate for Discharge Goal: Diagnostic test results will improve Outcome: Adequate for Discharge

## 2023-09-03 ENCOUNTER — Emergency Department (HOSPITAL_COMMUNITY)

## 2023-09-03 ENCOUNTER — Other Ambulatory Visit: Payer: Self-pay

## 2023-09-03 ENCOUNTER — Encounter (HOSPITAL_COMMUNITY): Payer: Self-pay

## 2023-09-03 ENCOUNTER — Inpatient Hospital Stay (HOSPITAL_COMMUNITY)

## 2023-09-03 ENCOUNTER — Inpatient Hospital Stay (HOSPITAL_COMMUNITY)
Admission: EM | Admit: 2023-09-03 | Discharge: 2023-09-17 | DRG: 291 | Disposition: A | Discharge status: Skilled Nursing Facility | Source: Skilled Nursing Facility | Attending: Obstetrics and Gynecology | Admitting: Obstetrics and Gynecology

## 2023-09-03 DIAGNOSIS — K9413 Enterostomy malfunction: Secondary | ICD-10-CM | POA: Diagnosis present

## 2023-09-03 DIAGNOSIS — K567 Ileus, unspecified: Secondary | ICD-10-CM

## 2023-09-03 DIAGNOSIS — R609 Edema, unspecified: Secondary | ICD-10-CM

## 2023-09-03 DIAGNOSIS — K8020 Calculus of gallbladder without cholecystitis without obstruction: Secondary | ICD-10-CM | POA: Diagnosis present

## 2023-09-03 DIAGNOSIS — Y838 Other surgical procedures as the cause of abnormal reaction of the patient, or of later complication, without mention of misadventure at the time of the procedure: Secondary | ICD-10-CM | POA: Diagnosis present

## 2023-09-03 DIAGNOSIS — K219 Gastro-esophageal reflux disease without esophagitis: Secondary | ICD-10-CM | POA: Diagnosis present

## 2023-09-03 DIAGNOSIS — K91873 Postprocedural seroma of a digestive system organ or structure following other procedure: Secondary | ICD-10-CM | POA: Diagnosis present

## 2023-09-03 DIAGNOSIS — I10 Essential (primary) hypertension: Secondary | ICD-10-CM | POA: Diagnosis present

## 2023-09-03 DIAGNOSIS — T8149XA Infection following a procedure, other surgical site, initial encounter: Secondary | ICD-10-CM | POA: Diagnosis present

## 2023-09-03 DIAGNOSIS — Z833 Family history of diabetes mellitus: Secondary | ICD-10-CM

## 2023-09-03 DIAGNOSIS — K429 Umbilical hernia without obstruction or gangrene: Secondary | ICD-10-CM | POA: Diagnosis present

## 2023-09-03 DIAGNOSIS — N179 Acute kidney failure, unspecified: Secondary | ICD-10-CM | POA: Diagnosis present

## 2023-09-03 DIAGNOSIS — K56609 Unspecified intestinal obstruction, unspecified as to partial versus complete obstruction: Secondary | ICD-10-CM | POA: Diagnosis present

## 2023-09-03 DIAGNOSIS — E7800 Pure hypercholesterolemia, unspecified: Secondary | ICD-10-CM | POA: Diagnosis present

## 2023-09-03 DIAGNOSIS — Z888 Allergy status to other drugs, medicaments and biological substances status: Secondary | ICD-10-CM

## 2023-09-03 DIAGNOSIS — Z9049 Acquired absence of other specified parts of digestive tract: Secondary | ICD-10-CM

## 2023-09-03 DIAGNOSIS — E611 Iron deficiency: Secondary | ICD-10-CM | POA: Diagnosis present

## 2023-09-03 DIAGNOSIS — R9431 Abnormal electrocardiogram [ECG] [EKG]: Secondary | ICD-10-CM

## 2023-09-03 DIAGNOSIS — L03311 Cellulitis of abdominal wall: Secondary | ICD-10-CM | POA: Diagnosis present

## 2023-09-03 DIAGNOSIS — E669 Obesity, unspecified: Secondary | ICD-10-CM | POA: Diagnosis present

## 2023-09-03 DIAGNOSIS — I5033 Acute on chronic diastolic (congestive) heart failure: Secondary | ICD-10-CM | POA: Diagnosis present

## 2023-09-03 DIAGNOSIS — J9601 Acute respiratory failure with hypoxia: Secondary | ICD-10-CM | POA: Diagnosis present

## 2023-09-03 DIAGNOSIS — Z66 Do not resuscitate: Secondary | ICD-10-CM | POA: Diagnosis present

## 2023-09-03 DIAGNOSIS — I700 Atherosclerosis of aorta: Secondary | ICD-10-CM | POA: Diagnosis present

## 2023-09-03 DIAGNOSIS — D631 Anemia in chronic kidney disease: Secondary | ICD-10-CM | POA: Diagnosis present

## 2023-09-03 DIAGNOSIS — A419 Sepsis, unspecified organism: Principal | ICD-10-CM

## 2023-09-03 DIAGNOSIS — I2489 Other forms of acute ischemic heart disease: Secondary | ICD-10-CM | POA: Diagnosis present

## 2023-09-03 DIAGNOSIS — R6510 Systemic inflammatory response syndrome (SIRS) of non-infectious origin without acute organ dysfunction: Secondary | ICD-10-CM | POA: Diagnosis present

## 2023-09-03 DIAGNOSIS — Z7901 Long term (current) use of anticoagulants: Secondary | ICD-10-CM | POA: Diagnosis not present

## 2023-09-03 DIAGNOSIS — N184 Chronic kidney disease, stage 4 (severe): Secondary | ICD-10-CM | POA: Diagnosis present

## 2023-09-03 DIAGNOSIS — I480 Paroxysmal atrial fibrillation: Secondary | ICD-10-CM | POA: Diagnosis present

## 2023-09-03 DIAGNOSIS — Z79899 Other long term (current) drug therapy: Secondary | ICD-10-CM

## 2023-09-03 DIAGNOSIS — E860 Dehydration: Secondary | ICD-10-CM | POA: Diagnosis present

## 2023-09-03 DIAGNOSIS — I714 Abdominal aortic aneurysm, without rupture, unspecified: Secondary | ICD-10-CM | POA: Diagnosis present

## 2023-09-03 DIAGNOSIS — R600 Localized edema: Secondary | ICD-10-CM

## 2023-09-03 DIAGNOSIS — R5381 Other malaise: Secondary | ICD-10-CM | POA: Diagnosis present

## 2023-09-03 DIAGNOSIS — I130 Hypertensive heart and chronic kidney disease with heart failure and stage 1 through stage 4 chronic kidney disease, or unspecified chronic kidney disease: Secondary | ICD-10-CM | POA: Diagnosis present

## 2023-09-03 DIAGNOSIS — Z803 Family history of malignant neoplasm of breast: Secondary | ICD-10-CM

## 2023-09-03 DIAGNOSIS — F410 Panic disorder [episodic paroxysmal anxiety] without agoraphobia: Secondary | ICD-10-CM | POA: Diagnosis present

## 2023-09-03 DIAGNOSIS — K529 Noninfective gastroenteritis and colitis, unspecified: Secondary | ICD-10-CM | POA: Diagnosis present

## 2023-09-03 DIAGNOSIS — M7989 Other specified soft tissue disorders: Secondary | ICD-10-CM | POA: Diagnosis not present

## 2023-09-03 DIAGNOSIS — F17210 Nicotine dependence, cigarettes, uncomplicated: Secondary | ICD-10-CM | POA: Diagnosis present

## 2023-09-03 DIAGNOSIS — Z823 Family history of stroke: Secondary | ICD-10-CM

## 2023-09-03 DIAGNOSIS — Z8249 Family history of ischemic heart disease and other diseases of the circulatory system: Secondary | ICD-10-CM

## 2023-09-03 DIAGNOSIS — I82612 Acute embolism and thrombosis of superficial veins of left upper extremity: Secondary | ICD-10-CM | POA: Diagnosis present

## 2023-09-03 DIAGNOSIS — Z841 Family history of disorders of kidney and ureter: Secondary | ICD-10-CM

## 2023-09-03 DIAGNOSIS — Z6829 Body mass index (BMI) 29.0-29.9, adult: Secondary | ICD-10-CM

## 2023-09-03 LAB — CBC WITH DIFFERENTIAL/PLATELET
Abs Immature Granulocytes: 0.08 10*3/uL — ABNORMAL HIGH (ref 0.00–0.07)
Basophils Absolute: 0 10*3/uL (ref 0.0–0.1)
Basophils Relative: 0 %
Eosinophils Absolute: 0.1 10*3/uL (ref 0.0–0.5)
Eosinophils Relative: 1 %
HCT: 30.8 % — ABNORMAL LOW (ref 36.0–46.0)
Hemoglobin: 9.1 g/dL — ABNORMAL LOW (ref 12.0–15.0)
Immature Granulocytes: 1 %
Lymphocytes Relative: 10 %
Lymphs Abs: 1.6 10*3/uL (ref 0.7–4.0)
MCH: 30.3 pg (ref 26.0–34.0)
MCHC: 29.5 g/dL — ABNORMAL LOW (ref 30.0–36.0)
MCV: 102.7 fL — ABNORMAL HIGH (ref 80.0–100.0)
Monocytes Absolute: 1.3 10*3/uL — ABNORMAL HIGH (ref 0.1–1.0)
Monocytes Relative: 8 %
Neutro Abs: 12.4 10*3/uL — ABNORMAL HIGH (ref 1.7–7.7)
Neutrophils Relative %: 80 %
Platelets: 373 10*3/uL (ref 150–400)
RBC: 3 MIL/uL — ABNORMAL LOW (ref 3.87–5.11)
RDW: 15.9 % — ABNORMAL HIGH (ref 11.5–15.5)
WBC: 15.4 10*3/uL — ABNORMAL HIGH (ref 4.0–10.5)
nRBC: 0.2 % (ref 0.0–0.2)

## 2023-09-03 LAB — COMPREHENSIVE METABOLIC PANEL WITH GFR
ALT: 18 U/L (ref 0–44)
AST: 18 U/L (ref 15–41)
Albumin: 2.3 g/dL — ABNORMAL LOW (ref 3.5–5.0)
Alkaline Phosphatase: 107 U/L (ref 38–126)
Anion gap: 10 (ref 5–15)
BUN: 20 mg/dL (ref 8–23)
CO2: 25 mmol/L (ref 22–32)
Calcium: 8.2 mg/dL — ABNORMAL LOW (ref 8.9–10.3)
Chloride: 103 mmol/L (ref 98–111)
Creatinine, Ser: 2.06 mg/dL — ABNORMAL HIGH (ref 0.44–1.00)
GFR, Estimated: 27 mL/min — ABNORMAL LOW (ref >60)
Glucose, Bld: 142 mg/dL — ABNORMAL HIGH (ref 70–99)
Potassium: 4.4 mmol/L (ref 3.5–5.1)
Sodium: 138 mmol/L (ref 135–145)
Total Bilirubin: 1.1 mg/dL (ref 0.0–1.2)
Total Protein: 7.1 g/dL (ref 6.5–8.1)

## 2023-09-03 LAB — BRAIN NATRIURETIC PEPTIDE: B Natriuretic Peptide: 2422.2 pg/mL — ABNORMAL HIGH (ref 0.0–100.0)

## 2023-09-03 LAB — CBC
HCT: 29 % — ABNORMAL LOW (ref 36.0–46.0)
Hemoglobin: 8.4 g/dL — ABNORMAL LOW (ref 12.0–15.0)
MCH: 30.9 pg (ref 26.0–34.0)
MCHC: 29 g/dL — ABNORMAL LOW (ref 30.0–36.0)
MCV: 106.6 fL — ABNORMAL HIGH (ref 80.0–100.0)
Platelets: 361 10*3/uL (ref 150–400)
RBC: 2.72 MIL/uL — ABNORMAL LOW (ref 3.87–5.11)
RDW: 15.9 % — ABNORMAL HIGH (ref 11.5–15.5)
WBC: 15.1 10*3/uL — ABNORMAL HIGH (ref 4.0–10.5)
nRBC: 0.1 % (ref 0.0–0.2)

## 2023-09-03 LAB — BASIC METABOLIC PANEL WITH GFR
Anion gap: 10 (ref 5–15)
BUN: 20 mg/dL (ref 8–23)
CO2: 23 mmol/L (ref 22–32)
Calcium: 8.3 mg/dL — ABNORMAL LOW (ref 8.9–10.3)
Chloride: 107 mmol/L (ref 98–111)
Creatinine, Ser: 1.9 mg/dL — ABNORMAL HIGH (ref 0.44–1.00)
GFR, Estimated: 29 mL/min — ABNORMAL LOW (ref >60)
Glucose, Bld: 104 mg/dL — ABNORMAL HIGH (ref 70–99)
Potassium: 4.3 mmol/L (ref 3.5–5.1)
Sodium: 140 mmol/L (ref 135–145)

## 2023-09-03 LAB — MAGNESIUM: Magnesium: 2.1 mg/dL (ref 1.7–2.4)

## 2023-09-03 LAB — RESP PANEL BY RT-PCR (RSV, FLU A&B, COVID)  RVPGX2
Influenza A by PCR: NEGATIVE
Influenza B by PCR: NEGATIVE
Resp Syncytial Virus by PCR: NEGATIVE
SARS Coronavirus 2 by RT PCR: NEGATIVE

## 2023-09-03 LAB — MRSA NEXT GEN BY PCR, NASAL: MRSA by PCR Next Gen: NOT DETECTED

## 2023-09-03 LAB — TROPONIN I (HIGH SENSITIVITY)
Troponin I (High Sensitivity): 27 ng/L — ABNORMAL HIGH (ref <18)
Troponin I (High Sensitivity): 29 ng/L — ABNORMAL HIGH (ref <18)

## 2023-09-03 LAB — I-STAT CG4 LACTIC ACID, ED
Lactic Acid, Venous: 1.1 mmol/L (ref 0.5–1.9)
Lactic Acid, Venous: 1.2 mmol/L (ref 0.5–1.9)

## 2023-09-03 MED ORDER — NALOXONE HCL 0.4 MG/ML IJ SOLN: 0.4000 mg | INTRAMUSCULAR | Status: DC | PRN

## 2023-09-03 MED ORDER — VANCOMYCIN HCL 1750 MG/350ML IV SOLN
1750.0000 mg | Freq: Once | INTRAVENOUS | Status: AC
  Administered 2023-09-03: 1750 mg via INTRAVENOUS
  Filled 2023-09-03: qty 350

## 2023-09-03 MED ORDER — ACETAMINOPHEN 650 MG RE SUPP: 650.0000 mg | Freq: Four times a day (QID) | RECTAL | Status: DC | PRN

## 2023-09-03 MED ORDER — ALBUMIN HUMAN 25 % IV SOLN
25.0000 g | Freq: Two times a day (BID) | INTRAVENOUS | Status: AC
  Administered 2023-09-03 – 2023-09-04 (×4): 25 g via INTRAVENOUS
  Filled 2023-09-03 (×4): qty 100

## 2023-09-03 MED ORDER — FUROSEMIDE 10 MG/ML IJ SOLN
40.0000 mg | Freq: Two times a day (BID) | INTRAMUSCULAR | Status: DC
  Administered 2023-09-03: 40 mg via INTRAVENOUS
  Filled 2023-09-03: qty 4

## 2023-09-03 MED ORDER — ACETAMINOPHEN 325 MG PO TABS
650.0000 mg | ORAL_TABLET | Freq: Four times a day (QID) | ORAL | Status: DC | PRN
  Administered 2023-09-03 – 2023-09-12 (×8): 650 mg via ORAL
  Filled 2023-09-03 (×8): qty 2

## 2023-09-03 MED ORDER — MORPHINE SULFATE (PF) 4 MG/ML IV SOLN: 4.0000 mg | Freq: Once | INTRAVENOUS | Status: DC

## 2023-09-03 MED ORDER — PANTOPRAZOLE SODIUM 40 MG PO TBEC
40.0000 mg | DELAYED_RELEASE_TABLET | Freq: Every day | ORAL | Status: DC
  Administered 2023-09-03 – 2023-09-17 (×15): 40 mg via ORAL
  Filled 2023-09-03 (×15): qty 1

## 2023-09-03 MED ORDER — SODIUM CHLORIDE 0.9 % IV SOLN
2.0000 g | Freq: Two times a day (BID) | INTRAVENOUS | Status: DC
  Administered 2023-09-03 – 2023-09-04 (×2): 2 g via INTRAVENOUS
  Filled 2023-09-03 (×2): qty 12.5

## 2023-09-03 MED ORDER — SODIUM CHLORIDE 0.9 % IV SOLN
2.0000 g | Freq: Once | INTRAVENOUS | Status: AC
  Administered 2023-09-03: 2 g via INTRAVENOUS
  Filled 2023-09-03: qty 12.5

## 2023-09-03 MED ORDER — MORPHINE SULFATE (PF) 2 MG/ML IV SOLN
1.0000 mg | Freq: Once | INTRAVENOUS | Status: AC | PRN
  Administered 2023-09-03: 1 mg via INTRAVENOUS
  Filled 2023-09-03: qty 1

## 2023-09-03 MED ORDER — CHLORHEXIDINE GLUCONATE CLOTH 2 % EX PADS
6.0000 | MEDICATED_PAD | Freq: Every day | CUTANEOUS | Status: DC
  Administered 2023-09-03 – 2023-09-16 (×8): 6 via TOPICAL

## 2023-09-03 MED ORDER — FUROSEMIDE 10 MG/ML IJ SOLN: 40.0000 mg | Freq: Two times a day (BID) | INTRAMUSCULAR | Status: DC

## 2023-09-03 MED ORDER — ATORVASTATIN CALCIUM 40 MG PO TABS
80.0000 mg | ORAL_TABLET | Freq: Every day | ORAL | Status: DC
  Administered 2023-09-03 – 2023-09-17 (×15): 80 mg via ORAL
  Filled 2023-09-03 (×15): qty 2

## 2023-09-03 MED ORDER — VANCOMYCIN HCL IN DEXTROSE 1-5 GM/200ML-% IV SOLN: 1000.0000 mg | INTRAVENOUS | Status: DC

## 2023-09-03 MED ORDER — METOPROLOL TARTRATE 50 MG PO TABS
50.0000 mg | ORAL_TABLET | Freq: Two times a day (BID) | ORAL | Status: DC
  Administered 2023-09-03 – 2023-09-09 (×13): 50 mg via ORAL
  Filled 2023-09-03 (×2): qty 1
  Filled 2023-09-03 (×2): qty 2
  Filled 2023-09-03 (×5): qty 1
  Filled 2023-09-03: qty 2
  Filled 2023-09-03 (×3): qty 1

## 2023-09-03 MED ORDER — METRONIDAZOLE 500 MG/100ML IV SOLN
500.0000 mg | Freq: Two times a day (BID) | INTRAVENOUS | Status: DC
  Administered 2023-09-03 – 2023-09-04 (×2): 500 mg via INTRAVENOUS
  Filled 2023-09-03 (×2): qty 100

## 2023-09-03 MED ORDER — FUROSEMIDE 10 MG/ML IJ SOLN
60.0000 mg | Freq: Two times a day (BID) | INTRAMUSCULAR | Status: DC
  Administered 2023-09-03 – 2023-09-06 (×6): 60 mg via INTRAVENOUS
  Filled 2023-09-03 (×7): qty 6

## 2023-09-03 MED ORDER — METRONIDAZOLE 500 MG/100ML IV SOLN
500.0000 mg | Freq: Once | INTRAVENOUS | Status: AC
  Administered 2023-09-03: 500 mg via INTRAVENOUS
  Filled 2023-09-03: qty 100

## 2023-09-03 MED ORDER — ORAL CARE MOUTH RINSE: 15.0000 mL | OROMUCOSAL | Status: DC | PRN

## 2023-09-03 NOTE — H&P (Signed)
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 called: General Surgery Level of care: Step Down Unit Admission status: It is my clinical opinion that admission to INPATIENT is reasonable and necessary because of the expectation that this patient will require hospital care that crosses at least 2 midnights to treat this condition based on the medical complexity of the problems presented.  Given the aforementioned information, the predictability of an adverse outcome is felt to be significant.  Juliette Oh MD [facility]  If 7PM-7AM, please contact night-coverage [email or web address]  09/03/2023, 5:18 AM

## 2023-09-03 NOTE — Progress Notes (Signed)
 ED Pharmacy Antibiotic Sign Off An antibiotic consult was received from an ED provider for Vacnomycin per pharmacy dosing for pneumonia. A chart review was completed to assess appropriateness.   The following one time order(s) were placed:  Vancomycin  1750mg  IV   Further antibiotic and/or antibiotic pharmacy consults should be ordered by the admitting provider if indicated.   Thank you for allowing pharmacy to be a part of this patient's care.   Arie Kurtz, [facility]  Clinical Pharmacist 09/03/23 3:26 AM

## 2023-09-03 NOTE — ED Triage Notes (Signed)
 Pt BIB [facility] from [facility] for leaking colostomy bag, but EMS found pt breathing over 30 resp/min, oxygen saturation 84% on RA, HR 130.  EMS gave 2 duonebs PTA.  Pt's left forearm is swollen & facility reports orders to elevate every 2 hours.  BP 190/114 HR 110 99% on neb tx Cbg 136 Pt c/o left sided pain from ribs down to hip rated 10/10.  Pt states she has been SOB on & off all day. Pt states her left side hurts worse when she takes a deep breath.  Pt has 29 staples in healing abdominal incision approximately 20cm long.

## 2023-09-03 NOTE — Progress Notes (Signed)
 Left upper extremity venous duplex and bilateral lower extremity venous duplex has been completed. Preliminary results can be found in CV Proc through chart review.  Results were given to Dr. Ada Acres.  09/03/23 10:12 AM Birda Buffy RVT

## 2023-09-03 NOTE — Consult Note (Addendum)
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 20, 2023 FINDINGS: Multiple sternal wires are present. The heart size and mediastinal contours are within normal limits. There is extensive stenting of the thoracic aorta. Low lung volumes are noted. Mild atelectasis and/or infiltrate is seen within the bilateral lung bases and mid right lung. No pleural effusion or pneumothorax is identified. Multilevel degenerative changes seen throughout the thoracic spine. IMPRESSION: Low lung volumes with mild bibasilar and mid right lung atelectasis and/or infiltrate. Electronically Signed   By: Virgle Grime M.D.   On: 09/03/2023 02:31    Assessment/Plan 63 y/o F 2 weeks s/p ex lap,  SBR, LOA, ileostomy revision, and primary ventral hernia repair at [facility] by Dr. Collene Dawson. She is admitted with tachycardia, leukocytosis, tachypnea, abdominal wall fluid collections, and an intra-abdominal collection in the RLQ consistent with possible fat necrosis. On my interpretation, the small bowel anastomosis is in the left upper abdomen, remote from this collection and there is no free air or fluid around the anastomosis. She has no clinical evidence of bowel obstruction. She has mild cellulitis at the proximal aspect of her incision, which I attempted to open as above without success. Will review with my attending and determine if I should re-open this area with local anesthesia at the bedside vs treat with abx alone. The rest of the patients abdominal wall does not have cellulitis -  would hold off on aspiration of the abdominal wall fluid collections at this time, as I suspect they are seroma/hematoma and not infectious. No role for emergent surgery at this time.   FEN - ok for a soft diet from CCS standpoint VTE - SCD's, recommend hep gtt instead of DOAC at this time for afib ID - cefepime , vanc Admit - TRH service   ?possible pneumonia/pneumonitis  CHFpEF HLD Hx aortic dissection Obesity CKD IV afib  I reviewed nursing notes, hospitalist notes, last 24 h vitals and pain scores, last 48 h intake and output, last 24 h labs and trends, and last 24 h imaging results.  Charlott Converse, [facility] 09/03/2023, 9:13 AM Please see Amion for pager number during day hours 7:00am-4:30pm or 7:00am -11:30am on weekends

## 2023-09-03 NOTE — Progress Notes (Signed)
 PROGRESS NOTE    Dorothy Harris  QIO:[id number] DOB: 1960/12/29 DOA: 09/03/2023 PCP: [name], MD  Chief Complaint  Patient presents with   Shortness of Breath    Brief Narrative:    Dorothy Harris is Dorothy Harris 63 y.o. 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  Recently admitted to [facility] from 5/1-5/24 for high-grade SBO status post ex lap with lysis of adhesions and resection of small bowel with end ileostomy revision on 5/15.  Also treated for AKI on CKD stage IV due to volume depletion/dehydration treated with IV fluids and acute anemia treated with [place] Winslow total of 3 units PRBCs.  Started on oral metoprolol  for [name]-fib and Eliquis was resumed on discharge.  He was discharged to SNF.   Patient presents to the ED today from SNF for evaluation of leaking ileostomy bag.  Found to be septic with imaging notable for multiple abdominal fluid collections and volume overload.  Admitted for sepsis and concern for infection related to abdominal fluid collection.    Surgery has been consulted.    Assessment & Plan:   Principal Problem:   Acute on chronic heart failure with preserved ejection fraction (HFpEF) (HCC) Active Problems:   Acute hypoxemic respiratory failure (HCC)   PAF (paroxysmal atrial fibrillation) (HCC)   Essential hypertension   Stage 4 chronic kidney disease (HCC)   Ileus (HCC)   Edema of left upper extremity   QT prolongation   Sepsis (HCC)  Multiple abdominal fluid collections concerning for abscesses (vs seroma or hematoma) Sepsis Ileus versus low-grade SBO Ostomy leakage History of recent high-grade SBO status post ex lap with lysis of adhesions and resection of small bowel with end ileostomy revision on 5/15  Meets criteria for sepsis with  tachycardia, tachypnea, and leukocytosis.   No lactic acidosis or hypotension.   n.p.o. at this time except sips with meds and continue broad-spectrum antibiotics.   General surgery consulted, appreciate recs.   Trend WBC count and follow-up blood cultures.   Acute on chronic HFpEF Acute hypoxemic respiratory failure Pulmonary Edema Anasarca Volume Overload Grossly overloaded with bilateral LE edema.  CT abd/pelvis with ground glass opacities in R middle and lower lobes (suspect ground glass edema given her clinical picture) ~ 86 kg on 5/4.  She's 103 kg today. Will diurese as tolerated (low albumin , will give some albumin  as well)  Echo 08/2023 with EF 60-65%, no RWMA.  Severe asymmetric LVH.  Diastolic function could not be evaluated.  Trivial MR.  Moderate calcification of AV.   Strict I/O, daily weights  Ground-glass opacities in the right middle and lower lobes  Suspect due to volume overload Diuresis as above Negative covid, flu, RSV   Left upper extremity edema No signs of neurovascular compromise.  Doppler ultrasound ordered to rule out DVT.   Troponin elevation Mild elevation, she's without chest pain.  EKG with artifact, but sinus (p waves difficult to see, but I think present) tach, isolated borderline ST elevation in lead III.  T wave inversions in I, aVL.  Similar to prior EKG. Low suspicion for ACS.  Will trend.     QT prolongation QTc 597 on EKG and patient is on amiodarone .  Potassium is within normal range.  Check magnesium  level and replace if low.  Avoid QT prolonging drugs and follow-up repeat  EKG in the morning.   Paroxysmal Dorothy Harris-fib Currently in sinus tachycardia (p waves difficult to appreciate, appear to be present).  Holding Eliquis until patient is evaluated by general surgery.  Continue metoprolol .  Holding amiodarone  at this time due to QT prolongation.  Follow-up repeat EKG in the morning to check QT interval and resume amiodarone  accordingly.   CKD stage  IV Creatinine 2.0, stable.  Continue to monitor renal function with diuresis.   Hypertension Follow with diuresis, metop BP currently on high side, adjust regimen as indicated   Hyperlipidemia Continue Lipitor .   Anemia Treated with 3 units PRBCs during recent admission and hemoglobin currently stable at 9.1.   Follow iron, ferritin, b12, folate   GERD Continue Protonix .   Incidental stable AAA Patient will need outpatient follow-up with vascular surgery for follow-up CTA or MRA in 6 months      DVT prophylaxis: SCD Code Status: DNR Family Communication: none Disposition:   Status is: Inpatient Remains inpatient appropriate because: need for ongoing inpatient care   Consultants:  Surgery - EDP discussed with Dr. Alethea Andes  Procedures:  none  Antimicrobials:  Anti-infectives (From admission, onward)    Start     Dose/Rate Route Frequency Ordered Stop   09/04/23 1600  vancomycin  (VANCOCIN ) IVPB 1000 mg/200 mL premix        1,000 mg 200 mL/hr over 60 Minutes Intravenous Every 36 hours 09/03/23 0655     09/03/23 1600  metroNIDAZOLE  (FLAGYL ) IVPB 500 mg        500 mg 100 mL/hr over 60 Minutes Intravenous Every 12 hours 09/03/23 0643     09/03/23 1500  ceFEPIme  (MAXIPIME ) 2 g in sodium chloride  0.9 % 100 mL IVPB        2 g 200 mL/hr over 30 Minutes Intravenous Every 12 hours 09/03/23 0655     09/03/23 0400  metroNIDAZOLE  (FLAGYL ) IVPB 500 mg        500 mg 100 mL/hr over 60 Minutes Intravenous  Once 09/03/23 0358 09/03/23 0535   09/03/23 0330  vancomycin  (VANCOREADY) IVPB 1750 mg/350 mL        1,750 mg 175 mL/hr over 120 Minutes Intravenous  Once 09/03/23 0325 09/03/23 0608   09/03/23 0300  ceFEPIme  (MAXIPIME ) 2 g in sodium chloride  0.9 % 100 mL IVPB        2 g 200 mL/hr over 30 Minutes Intravenous  Once 09/03/23 0251 09/03/23 0336       Subjective: Feels better since lasix , pain meds  Objective: Vitals:   09/03/23 0344 09/03/23 0400 09/03/23 0500 09/03/23 0737   BP: (!) 176/102 (!) 156/97 (!) 169/103   Pulse: (!) 114 (!) 114 (!) 112 (!) 113  Resp: (!) 26 (!) 23 (!) 24 (!) 25  Temp: 99.1 F (37.3 C)   98.8 F (37.1 C)  TempSrc: Oral   Oral  SpO2: 96% 98% 98% 98%  Weight:      Height:        Intake/Output Summary (Last 24 hours) at 09/03/2023 0858 Last data filed at 09/03/2023 1610 Gross per 24 hour  Intake 100 ml  Output --  Net 100 ml   Filed Weights   09/03/23 0131  Weight: 103 kg    Examination:  General exam: obese, no acute distress Respiratory system: unlabored Cardiovascular system: RRR Gastrointestinal system: midline staples, ileostomy with scant output - mild ttp around midline wound Central nervous system: Alert and oriented. No focal neurological deficits. Extremities: bilateral LE edema, left upper  extremity swelling.  Dependent edema to hips.   Data Reviewed: I have personally reviewed following labs and imaging studies  CBC: Recent Labs  Lab 08/28/23 0441 08/29/23 0321 09/03/23 0149  WBC 9.9 8.9 15.4*  NEUTROABS  --   --  12.4*  HGB 8.4* 8.6* 9.1*  HCT 27.7* 28.0* 30.8*  MCV 100.0 101.4* 102.7*  PLT 289 310 373    Basic Metabolic Panel: Recent Labs  Lab 08/28/23 0441 09/03/23 0149  NA 138 138  K 4.2 4.4  CL 107 103  CO2 25 25  GLUCOSE 99 142*  BUN 47* 20  CREATININE 2.19* 2.06*  CALCIUM  8.0* 8.2*  MG 2.0  --     GFR: Estimated Creatinine Clearance: 35 mL/min (Wilfrid Hyser) (by C-G formula based on SCr of 2.06 mg/dL (H)).  Liver Function Tests: Recent Labs  Lab 09/03/23 0149  AST 18  ALT 18  ALKPHOS 107  BILITOT 1.1  PROT 7.1  ALBUMIN  2.3*    CBG: Recent Labs  Lab 08/29/23 0753 08/29/23 1129 08/29/23 1645 08/29/23 2049 08/30/23 0733  GLUCAP 96 106* 116* 104* 91     Recent Results (from the past 240 hours)  Resp panel by RT-PCR (RSV, Flu Viviann Broyles&B, Covid) Anterior Nasal Swab     Status: None   Collection Time: 09/03/23  1:49 AM   Specimen: Anterior Nasal Swab  Result Value Ref Range  Status   SARS Coronavirus 2 by RT PCR NEGATIVE NEGATIVE Final    Comment: (NOTE) SARS-CoV-2 target nucleic acids are NOT DETECTED.  The SARS-CoV-2 RNA is generally detectable in upper respiratory specimens during the acute phase of infection. The lowest concentration of SARS-CoV-2 viral copies this assay can detect is 138 copies/mL. Offie Pickron negative result does not preclude SARS-Cov-2 infection and should not be used as the sole basis for treatment or other patient management decisions. Chasen Mendell negative result may occur with  improper specimen collection/handling, submission of specimen other than nasopharyngeal swab, presence of viral mutation(s) within the areas targeted by this assay, and inadequate number of viral copies(<138 copies/mL). Lianni Kanaan negative result must be combined with clinical observations, patient history, and epidemiological information. The expected result is Negative.  Fact Sheet for Patients:  BloggerCourse.com  Fact Sheet for Healthcare Providers:  SeriousBroker.it  This test is no t yet approved or cleared by the [place]  FDA and  has been authorized for detection and/or diagnosis of SARS-CoV-2 by FDA under an Emergency Use Authorization (EUA). This EUA will remain  in effect (meaning this test can be used) for the duration of the COVID-19 declaration under Section 564(b)(1) of the Act, 21 U.S.C.section 360bbb-3(b)(1), unless the authorization is terminated  or revoked sooner.       Influenza Bobbi Kozakiewicz by PCR NEGATIVE NEGATIVE Final   Influenza B by PCR NEGATIVE NEGATIVE Final    Comment: (NOTE) The Xpert Xpress SARS-CoV-2/FLU/RSV plus assay is intended as an aid in the diagnosis of influenza from Nasopharyngeal swab specimens and should not be used as Sharhonda Atwood sole basis for treatment. Nasal washings and aspirates are unacceptable for Xpert Xpress SARS-CoV-2/FLU/RSV testing.  Fact Sheet for  Patients: BloggerCourse.com  Fact Sheet for Healthcare Providers: SeriousBroker.it  This test is not yet approved or cleared by the [place]  FDA and has been authorized for detection and/or diagnosis of SARS-CoV-2 by FDA under an Emergency Use Authorization (EUA). This EUA will remain in effect (meaning this test can be used) for the duration of the COVID-19 declaration under Section 564(b)(1) of the Act,  21 U.S.C. section 360bbb-3(b)(1), unless the authorization is terminated or revoked.     Resp Syncytial Virus by PCR NEGATIVE NEGATIVE Final    Comment: (NOTE) Fact Sheet for Patients: BloggerCourse.com  Fact Sheet for Healthcare Providers: SeriousBroker.it  This test is not yet approved or cleared by the [place]  FDA and has been authorized for detection and/or diagnosis of SARS-CoV-2 by FDA under an Emergency Use Authorization (EUA). This EUA will remain in effect (meaning this test can be used) for the duration of the COVID-19 declaration under Section 564(b)(1) of the Act, 21 U.S.C. section 360bbb-3(b)(1), unless the authorization is terminated or revoked.  Performed at [facility], [address], [place], [place] [address]   Blood culture (routine x 2)     Status: None (Preliminary result)   Collection Time: 09/03/23  1:49 AM   Specimen: BLOOD  Result Value Ref Range Status   Specimen Description   Final    BLOOD RIGHT ANTECUBITAL Performed at [facility], [address], [place], [place] [address]    Special Requests   Final    BOTTLES DRAWN AEROBIC AND ANAEROBIC Blood Culture adequate volume Performed at [facility], [address] [place], [place] [address]    Culture   Final    NO GROWTH <12 HOURS Performed at [facility], [address], [place], [place] [address]    Report Status  PENDING  Incomplete  Blood culture (routine x 2)     Status: None (Preliminary result)   Collection Time: 09/03/23  3:00 AM   Specimen: BLOOD  Result Value Ref Range Status   Specimen Description   Final    BLOOD BLOOD RIGHT WRIST Performed at [facility], [address], [place], [place] [address]    Special Requests   Final    BOTTLES DRAWN AEROBIC AND ANAEROBIC Blood Culture adequate volume Performed at [facility], [address], [place], [place] [address]    Culture   Final    NO GROWTH <12 HOURS Performed at [facility], [address] [place], [place] [address]    Report Status PENDING  Incomplete  MRSA Next Gen by PCR, Nasal     Status: None   Collection Time: 09/03/23  4:37 AM   Specimen: Nasal Mucosa; Nasal Swab  Result Value Ref Range Status   MRSA by PCR Next Gen NOT DETECTED NOT DETECTED Final    Comment: (NOTE) The GeneXpert MRSA Assay (FDA approved for NASAL specimens only), is one component of Lorina Duffner comprehensive MRSA colonization surveillance program. It is not intended to diagnose MRSA infection nor to guide or monitor treatment for MRSA infections. Test performance is not FDA approved in patients less than 78 years old. Performed at [facility], [address] [place], [place] [address]          Radiology Studies: CT ABDOMEN PELVIS WO CONTRAST Result Date: 09/03/2023 CLINICAL DATA:  Abdominal pain, acute, nonlocalized. Leaking colostomy bag. Left-sided pain from the ribcage to the hip. Prior colon surgery 2022. Lysis of adhesions 08/20/2023 with ventral hernia repair. EXAM: CT ABDOMEN AND PELVIS WITHOUT CONTRAST TECHNIQUE: Multidetector CT imaging of the abdomen and pelvis was performed following the standard protocol without IV contrast. RADIATION DOSE REDUCTION: This exam was performed according to the departmental dose-optimization program which includes automated exposure control,  adjustment of the mA and/or kV according to patient size and/or use of iterative reconstruction technique.  COMPARISON:  The preoperative CTs without contrast 08/12/2023 and 08/06/2023. FINDINGS: Lower chest: Mild cardiomegaly. Healed median sternotomy. No pericardial effusion. There is Eunique Balik partially visible ascending aortic sleeve repair, median sternotomy sutures, partially visible thoracic aortic stent graft repair, maximum descending aortic caliber 4.6 cm. Chronic elevation right hemidiaphragm. There is increased posterior atelectasis in the lower lobes, increased subsegmental atelectasis in the posterior right middle lobe. There are increased ground-glass opacities in the right middle and lower lobes which could be ground-glass edema or pneumonitis. Hepatobiliary: Limited detail in the liver owing to streak artifacts from the patient's overlying arms. No obvious mass. There are tiny layering stones in the gallbladder without bile duct dilatation or wall thickening. Pancreas: Partially atrophic. Otherwise unremarkable without contrast. Spleen: Unremarkable without contrast. Adrenals/Urinary Tract: No adrenal mass. No contour deforming abnormality of either of the unenhanced kidneys. Chronic perinephric stranding is similar. No urinary stone or obstruction. There is no bladder thickening. There is trace air in the anterior bladder which could be due to recent catheterization or infection. This not seen on the earlier 2 studies from this month. Stomach/Bowel: As seen previously there has been Jason Hauge remote right hemicolectomy up to the distal transverse colon. The unused portion of the colon is contracted and empty. The gastric wall is contracted. Majority of the small bowel is normal in caliber with slightly dilated segments in the anterior left mid abdomen up to 2.7 cm caliber, favor ileus but could be due to Sahalie Beth low-grade obstruction. The small bowel was diffusely much more dilated previously. There is Octavion Mollenkopf right mid  abdominal loop ileostomy. There previously was Ajahni Nay sizable parastomal hernia which has been reduced, with herniated bowel segments no longer seen. Vascular/Lymphatic: There is moderate to heavy aortoiliac calcific plaque. 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 No lymphadenopathy is seen.  Juxtarenal AAA again measures 4 cm. Reproductive: Uterus and bilateral adnexa are unremarkable. Other: Loculated fluid collection just deep to the anterolateral right mid abdominal wall measures 4.4 x 2.8 cm on 2:50, 20 Hounsfield units and could be Calyx Hawker loculated seroma or hematoma, with infection not excluded. There is mild patchy fluid in the mesenteric folds, and scattered nonlocalizing fluid in Aluel Schwarz circumscribed area in the anterior right lower abdominopelvic quadrant measuring 16.3 x 6.3 cm on 2:68, displacing bowel around it and containing fat and scattered fluid. I believe this is probably fat which was previously herniated and has been placed back into the abdominal cavity. This may be undergoing Seger Jani degree of fat necrosis. There previously was Jasmon Mattice large complex right paraumbilical hernia just above this level which has been repaired. There are overlying midline skin staples. There is an ovoid subcutaneous fluid collection anteriorly along side the umbilicus in the right mid abdomen measuring 10.4 x 2.5 cm and 18 Hounsfield units on 2:59 which is probably Deazia Lampi seroma. There is Neola Worrall hematoma underlying the incision subcutaneously measuring 4.7 x 2.7 cm on 2:63, and another subcutaneous hematoma in the anterior right mid abdomen below this level measuring 3.9 x 3.0 cm on 2:77, as well as body wall anasarca in the lateral abdominal walls and flanks and in the upper thighs. There is no Harris hemorrhage seen, no evidence of Harris air. Multiple pelvic phleboliths. Musculoskeletal: There are degenerative changes in the lumbar spine greatest at L5-S1, ankylosis over the anterior right SI joint. No acute or other significant osseous findings.  IMPRESSION: 1. 4.4 x 2.8 cm loculated fluid collection just deep to the anterolateral right mid abdominal wall, could be Deontray Hunnicutt  loculated seroma or hematoma, with infection not excluded. 2. 16.3 x 6.3 cm circumscribed area in the anterior right lower abdominopelvic quadrant containing fat and scattered fluid, probably fat which was previously herniated and has been placed back into the abdominal cavity. This may be undergoing Ernestyne Caldwell degree of fat necrosis. 3. 10.4 x 2.5 cm subcutaneous fluid collection anteriorly along side the umbilicus in the right mid abdomen, probably Cathe Bilger seroma. 4. 4.7 x 2.7 cm hematoma underlying the incision subcutaneously, and another 3.9 x 3.0 cm hematoma in the anterior right mid abdomen below this level. 5. Parastomal and right paraumbilical hernia repairs as detailed above. 6. Body wall anasarca. 7. Slightly dilated small bowel segments in the anterior left mid abdomen up to 2.7 cm caliber, favor ileus but could be due to Jarrah Babich low-grade obstruction. The small bowel was diffusely much more dilated previously. 8. Cholelithiasis. 9. Increased ground-glass opacities in the right middle and lower lobes which could be ground-glass edema or pneumonitis. 10. Cardiomegaly with partially visible ascending aortic sleeve repair, median sternotomy sutures, and partially visible thoracic aortic stent graft repair, maximum descending aortic caliber 4.6 cm. 11. Aortic atherosclerosis and stable AAA. Recommend CTA or MRA, as appropriate, in 6 months and referral to W[facility] Eichorn vascular specialist if not already done. Reference: Journal of Vascular Surgery 67.1 (2018): 2-77. J Am Coll Radiol [phone]. Aortic Atherosclerosis (ICD10-I70.0). Electronically Signed   By: Denman Fischer M.D.   On: 09/03/2023 03:01   DG Chest Portable 1 View Result Date: 09/03/2023 CLINICAL DATA:  Shortness of breath. EXAM: PORTABLE CHEST 1 VIEW COMPARISON:  Aug 20, 2023 FINDINGS: Multiple sternal wires are present. The heart size and  mediastinal contours are within normal limits. There is extensive stenting of the thoracic aorta. Low lung volumes are noted. Mild atelectasis and/or infiltrate is seen within the bilateral lung bases and mid right lung. No pleural effusion or pneumothorax is identified. Multilevel degenerative changes seen throughout the thoracic spine. IMPRESSION: Low lung volumes with mild bibasilar and mid right lung atelectasis and/or infiltrate. Electronically Signed   By: Virgle Grime M.D.   On: 09/03/2023 02:31        Scheduled Meds:  atorvastatin   80 mg Oral Daily   Chlorhexidine  Gluconate Cloth  6 each Topical Daily   furosemide   40 mg Intravenous BID   metoprolol  tartrate  50 mg Oral BID   pantoprazole   40 mg Oral Daily   Continuous Infusions:  albumin  human 12.5 g (09/03/23 0755)   ceFEPime  (MAXIPIME ) IV     metronidazole      [START ON 09/04/2023] vancomycin        LOS: 0 days    Time spent: over 30 min    Donnetta Gains, MD [facility]   To contact the attending provider between 7A-7P or the covering provider during after hours 7P-7A, please log into the web site [email or web address] and access using universal [place] password for that web site. If you do not have the password, please call the hospital operator.  09/03/2023, 8:58 AM

## 2023-09-03 NOTE — Progress Notes (Signed)
 Pharmacy Antibiotic Note  Dorothy Harris is a 63 y.o. female admitted on 09/03/2023 with sepsis.  Pharmacy has been consulted for vanc/cefepime  dosing.  Plan: Vanc 1750mg  IV x 1 then 1g IV q36 - goal AUC 400-550 Cefepime  2g IV q12  Height: 5\' 7"  (170.2 cm) Weight: 103 kg (227 lb 1.2 oz) IBW/kg (Calculated) : 61.6  Temp (24hrs), Avg:98.9 F (37.2 C), Min:98.6 F (37 C), Max:99.1 F (37.3 C)  Recent Labs  Lab 08/28/23 0441 08/29/23 0321 09/03/23 0149 09/03/23 0206 09/03/23 0334  WBC 9.9 8.9 15.4*  --   --   CREATININE 2.19*  --  2.06*  --   --   LATICACIDVEN  --   --   --  1.1 1.2    Estimated Creatinine Clearance: 35 mL/min (A) (by C-G formula based on SCr of 2.06 mg/dL (H)).    Allergies  Allergen Reactions   Chlorthalidone  Other (See Comments)    ELEVATED KIDNEY FUNCTION     Thank you for allowing pharmacy to be a part of this patient's care.  Bernett Brill 09/03/2023 6:52 AM

## 2023-09-03 NOTE — Progress Notes (Signed)
 Heart Failure Navigator Progress Note  Assessed for Heart & Vascular TOC clinic readiness.  Patient does not meet criteria due to EF 60-65%, . Per MD Admitted for sepsis and concern for infection related to abdominal fluid collection. No HF TOC.   Navigator will sign off at this time.   Randie Bustle, BSN, Scientist, clinical (histocompatibility and immunogenetics) Only

## 2023-09-03 NOTE — ED Notes (Signed)
 Pt's oxygen saturation decreased to 90% on room air. Pt's HOB elevated to position of comfort & placed on 2L [place], increasing oxygen saturation to 96% EDP made aware.

## 2023-09-03 NOTE — Plan of Care (Signed)
 ?  Problem: Clinical Measurements: ?Goal: Ability to maintain clinical measurements within normal limits will improve ?Outcome: Progressing ?Goal: Will remain free from infection ?Outcome: Progressing ?Goal: Diagnostic test results will improve ?Outcome: Progressing ?  ?

## 2023-09-03 NOTE — TOC Initial Note (Addendum)
 Transition of Care [facility]) - Initial/Assessment Note    Patient Details  Name: Dorothy Harris MRN: [id number] Date of Birth: May 22, 1960  Transition of Care [facility]) CM/SW Contact:    Jonni Nettle, LCSW Phone Number: 09/03/2023, 12:12 PM  Clinical Narrative:                 Pt admitted to ICU from [facility], where she was receiving short-term rehab. CSW spoke with Crystal, in admissions, at [facility]. Per Crystal, pt can return to [facility] once medically stable. Pt will need PT/OT evaluation and recommendation for SNF. TOC will continue to follow for discharge planning.   Expected Discharge Plan:  (TBD) Barriers to Discharge: Continued Medical Work up   Patient Goals and CMS Choice Patient states their goals for this hospitalization and ongoing recovery are:: To return back to SNF or home   Choice offered to / list presented to : NA    Expected Discharge Plan and Services In-house Referral: Clinical Social Work   Post Acute Care Choice: NA Living arrangements for the past 2 months: Skilled Nursing Facility [name])                 DME Arranged: N/A DME Agency: NA   Prior Living Arrangements/Services Living arrangements for the past 2 months: Skilled Nursing Facility Software engineer) Lives with:: Self, Facility Resident Patient language and need for interpreter reviewed:: Yes Do you feel safe going back to the place where you live?: Yes      Need for Family Participation in Patient Care: No (Comment) Care giver support system in place?: Yes (comment)   Criminal Activity/Legal Involvement Pertinent to Current Situation/Hospitalization: No - Comment as needed  Permission Sought/Granted Permission sought to share information with : Facility Industrial/product designer granted to share information with : Yes, Verbal Permission Granted     Permission granted to share info w AGENCY: [name]  Emotional Assessment Appearance::  (UTA) Attitude/Demeanor/Rapport:  Unable to Assess Affect (typically observed): Unable to Assess Orientation: : Oriented to Self, Oriented to Place, Oriented to  Time, Oriented to Situation Alcohol / Substance Use: Not Applicable Psych Involvement: No (comment)  Admission diagnosis:  Acute on chronic heart failure with preserved ejection fraction (HFpEF) (HCC) [I50.33] Sepsis, due to unspecified organism, unspecified whether acute organ dysfunction present [facility]) [A41.9] Patient Active Problem List   Diagnosis Date Noted   Acute on chronic heart failure with preserved ejection fraction (HFpEF) (HCC) 09/03/2023   Ileus (HCC) 09/03/2023   Edema of left upper extremity 09/03/2023   QT prolongation 09/03/2023   Sepsis (HCC) 09/03/2023   Persistent atrial fibrillation (HCC) 08/27/2023   Perforated bowel (HCC) 08/20/2023   Mesenteric abscess (HCC) 08/20/2023   Atrial fibrillation with rapid ventricular response (HCC) 08/17/2023   Small bowel obstruction (HCC) 08/07/2023   Abdominal pain 08/07/2023   Nausea & vomiting 08/07/2023   Dehydration 08/07/2023   Elevated troponin 08/07/2023   Obesity, Class I, BMI 30-34.9 08/07/2023   (HFpEF) heart failure with preserved ejection fraction (HCC) 08/07/2023   Ischemic colitis (HCC) 08/07/2023   Ileostomy in place (HCC) 08/07/2023   Parastomal hernia without obstruction or gangrene 08/07/2023   Incisional hernia with obstruction but no gangrene 08/07/2023   Dyspnea on exertion 11/19/2022   Vitamin D  deficiency 05/29/2022   Abdominal aortic aneurysm (AAA) (HCC) 05/08/2022   Iron deficiency 05/08/2022   Acute lumbar back pain 05/08/2022   Anemia of chronic renal failure 03/20/2022   Hospital discharge follow-up 02/20/2022   Anemia 01/29/2022  History of pneumonia 01/29/2022   Hyperglycemia 01/29/2022   Impaired mobility and ADLs 01/29/2022   Incomplete paraplegia (HCC) 01/29/2022   Wound of left groin 01/29/2022   Stage 4 chronic kidney disease (HCC)    Leukocytosis    Leg  weakness, bilateral 01/17/2022   Thoracic aortic aneurysm (HCC) 01/15/2022   Annual physical exam 11/05/2021   Depression, major, single episode, complete remission (HCC) 06/21/2021   S/P exploratory laparotomy 06/21/2021   Mass of right breast 06/21/2021   Disability examination 06/21/2021   Pressure injury of skin 06/02/2021   Hyperkalemia 06/01/2021   Acute hyperkalemia 06/01/2021   PAF (paroxysmal atrial fibrillation) (HCC) 05/07/2021   Essential hypertension 05/07/2021   Pure hypercholesterolemia 05/07/2021   Debility 04/15/2021   Acute hypoxemic respiratory failure (HCC) 04/07/2021   Malnutrition of moderate degree 04/03/2021   Encounter for postanesthesia care 04/02/2021   Peritonitis (HCC) 04/02/2021   Endotracheally intubated 04/02/2021   Acute kidney injury superimposed on stage 4 chronic kidney disease (HCC) 04/02/2021   S/P ascending aortic replacement 03/25/2021   Encounter for weaning from ventilator [facility])    Status post surgery 03/24/2021   Tobacco abuse 07/30/2017   History of drug use 07/30/2017   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/2017   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   Chest pain 05/31/2017   Coronary artery disease due to lipid rich plaque    Acute on chronic diastolic CHF (congestive heart failure) (HCC)    Acute coronary syndrome (HCC) 12/23/2015   NSTEMI (non-ST elevated myocardial infarction) (HCC) 12/22/2015   PCP:  [name], MD Pharmacy:   [facility] - [place], [place] - [address] [place] [address] Phone: [phone] Fax: [phone]   Social Drivers of Health (SDOH) Social History: SDOH Screenings   Food Insecurity: Food Insecurity Present (08/07/2023)  Housing: High Risk (08/07/2023)  Transportation Needs: No Transportation Needs (08/07/2023)  Utilities: Not At Risk (08/07/2023)  Alcohol Screen: Low Risk  (05/07/2021)  Depression (PHQ2-9): Medium Risk  (05/05/2023)  Financial Resource Strain: Low Risk  (05/07/2021)  Physical Activity: Inactive (05/07/2021)  Tobacco Use: High Risk (08/20/2023)   SDOH Interventions: None indicated Food Insecurity Interventions: Intervention Not Indicated, Inpatient TOC Housing Interventions: Intervention Not Indicated, Inpatient TOC  Readmission Risk Interventions    09/03/2023   10:08 AM 08/11/2023    8:27 AM 08/07/2023    7:29 AM  Readmission Risk Prevention Plan  Medication Screening   Complete  Transportation Screening Complete Complete Complete  PCP or Specialist Appt within 5-7 Days Complete    Home Care Screening Complete    Medication Review (RN CM) Complete    HRI or Home Care Consult  Complete   Social Work Consult for Recovery Care Planning/Counseling  Complete   Palliative Care Screening  Not Applicable   Medication Review (RN Care Manager)  Complete     [place], MSW, LCSW 09/03/2023 12:12 PM

## 2023-09-03 NOTE — ED Provider Notes (Signed)
 [place] EMERGENCY DEPARTMENT AT [facility] Provider Note   CSN: [id number] Arrival date & time: 09/03/23  0119     History Chief Complaint  Patient presents with   Shortness of Breath    HPI Dorothy Harris is a 63 y.o. female presenting for chief complaint of shortness of breath and leaking colostomy bag.  Recently admitted for ostomy placement secondary to small bowel obstruction. Recently discharged to skilled nursing facility.  Had worsening abdominal swelling and pain.  Subjective fever and shortness of breath so facility called for ED evaluation. Patient endorses severe diffuse abdominal pain that she states is acute on chronic.  Patient's recorded medical, surgical, social, medication list and allergies were reviewed in the Snapshot window as part of the initial history.   Review of Systems   Review of Systems  Constitutional:  Positive for fever. Negative for chills.  HENT:  Negative for ear pain and sore throat.   Eyes:  Negative for pain and visual disturbance.  Respiratory:  Positive for shortness of breath. Negative for cough.   Cardiovascular:  Negative for chest pain and palpitations.  Gastrointestinal:  Positive for abdominal pain and nausea. Negative for vomiting.  Genitourinary:  Negative for dysuria and hematuria.  Musculoskeletal:  Negative for arthralgias and back pain.  Skin:  Negative for color change and rash.  Neurological:  Negative for seizures and syncope.  All other systems reviewed and are negative.   Physical Exam Updated Vital Signs BP (!) 176/102 (BP Location: Left Arm)   Pulse (!) 114   Temp 99.1 F (37.3 C) (Oral)   Resp (!) 26   Ht 5\' 7"  (1.702 m)   Wt 103 kg   SpO2 96%   BMI 35.56 kg/m  Physical Exam Vitals and nursing note reviewed.  Constitutional:      General: She is not in acute distress.    Appearance: She is well-developed.  HENT:     Head: Normocephalic and atraumatic.  Eyes:     Conjunctiva/sclera:  Conjunctivae normal.  Cardiovascular:     Rate and Rhythm: Normal rate and regular rhythm.     Heart sounds: No murmur heard. Pulmonary:     Effort: Pulmonary effort is normal. No respiratory distress.     Breath sounds: Normal breath sounds.  Abdominal:     General: There is no distension.     Palpations: Abdomen is soft.     Tenderness: There is abdominal tenderness. There is no right CVA tenderness or left CVA tenderness.  Musculoskeletal:        General: No swelling or tenderness. Normal range of motion.     Cervical back: Neck supple.  Skin:    General: Skin is warm and dry.  Neurological:     General: No focal deficit present.     Mental Status: She is alert and oriented to person, place, and time. Mental status is at baseline.     Cranial Nerves: No cranial nerve deficit.      ED Course/ Medical Decision Making/ A&P    Procedures .Critical Care  Performed by: Onetha Bile, MD Authorized by: Onetha Bile, MD   Critical care provider statement:    Critical care time (minutes):  90   Critical care was necessary to treat or prevent imminent or life-threatening deterioration of the following conditions:  Sepsis   Critical care was time spent personally by me on the following activities:  Development of treatment plan with patient or surrogate, discussions with consultants,  evaluation of patient's response to treatment, examination of patient, ordering and review of laboratory studies, ordering and review of radiographic studies, ordering and performing treatments and interventions, pulse oximetry, re-evaluation of patient's condition and review of old charts   Care discussed with: admitting provider      Medications Ordered in ED Medications  vancomycin  (VANCOREADY) IVPB 1750 mg/350 mL (has no administration in time range)  metroNIDAZOLE  (FLAGYL ) IVPB 500 mg (has no administration in time range)  ceFEPIme  (MAXIPIME ) 2 g in sodium chloride  0.9 % 100 mL IVPB (2 g  Intravenous New Bag/Given 09/03/23 0306)   Medical Decision Making:   Dorothy Harris is a 63 y.o. female who presented to the ED today with multiple symptoms detailed above.    Handoff received from EMS.  External chart has been reviewed including any [facility] admission notes. Patient placed on continuous vitals and telemetry monitoring while in ED which was reviewed periodically.  Complete initial physical exam performed, notably the patient  was ill-appearing. During this initial exam, patient met criteria for activation of code sepsis due to presence of the following SIRS criteria as well as suspected infectious etiology:tachypnea,tachycardia,triage CBC with leukocytosis. Reviewed and confirmed nursing documentation for past medical history, family history, social history.    Initial Assessment:   With the patient's presentation of signs and symptoms of sepsis, most likely diagnosis is bacteremia secondary to underlying infection.  Considerations for source were initiated including:Urinary tract infections, abdominal infections such as cholecystitis/cholangitis/appendicitis, pulmonary etiology, bacteremia, skin etiology such as cellulitis or fasciitis, neurologic etiology such as meningitis or encephalitis.  This is most consistent with an acute life/limb threatening illness complicated by underlying chronic conditions.  Initial Plan:  Activated hospital protocol code sepsis including blood cultures, lactic acid screening, and further diagnostic care and management. Therapeutically, resuscitation fluids were considered. Patient has a history of heart failure and clinical evidence of volume overload therefore 30 cc/kg is not indicated. Therapeutically, antibiotics were administered on a broad-spectrum nature. Undifferentiated source: Vancomycin  and cefepime  were administered to cover gram-positive and gram-negative high risk infections  Screening labs including CBC and Metabolic panel to evaluate  for infectious or metabolic etiology of disease.  Urinalysis with reflex culture ordered to evaluate for UTI or relevant urologic/nephrologic pathology.  CT abdomen pelvis to evaluate for intra-abdominal etiology of symptoms EKG to evaluate for cardiac pathology Objective evaluation as below reviewed   Initial Study Results:   Laboratory  Patient leukocytotic EKG EKG was reviewed independently. Rate, rhythm, axis, intervals all examined and without medically relevant abnormality. ST segments without concerns for elevations.    Radiology:  All images reviewed independently. Agree with radiology report at this time.   CT ABDOMEN PELVIS WO CONTRAST Result Date: 09/03/2023 CLINICAL DATA:  Abdominal pain, acute, nonlocalized. Leaking colostomy bag. Left-sided pain from the ribcage to the hip. Prior colon surgery 2022. Lysis of adhesions 08/20/2023 with ventral hernia repair. EXAM: CT ABDOMEN AND PELVIS WITHOUT CONTRAST TECHNIQUE: Multidetector CT imaging of the abdomen and pelvis was performed following the standard protocol without IV contrast. RADIATION DOSE REDUCTION: This exam was performed according to the departmental dose-optimization program which includes automated exposure control, adjustment of the mA and/or kV according to patient size and/or use of iterative reconstruction technique. COMPARISON:  The preoperative CTs without contrast 08/12/2023 and 08/06/2023. FINDINGS: Lower chest: Mild cardiomegaly. Healed median sternotomy. No pericardial effusion. There is a partially visible ascending aortic sleeve repair, median sternotomy sutures, partially visible thoracic aortic stent graft repair, maximum descending aortic  caliber 4.6 cm. Chronic elevation right hemidiaphragm. There is increased posterior atelectasis in the lower lobes, increased subsegmental atelectasis in the posterior right middle lobe. There are increased ground-glass opacities in the right middle and lower lobes which could be  ground-glass edema or pneumonitis. Hepatobiliary: Limited detail in the liver owing to streak artifacts from the patient's overlying arms. No obvious mass. There are tiny layering stones in the gallbladder without bile duct dilatation or wall thickening. Pancreas: Partially atrophic. Otherwise unremarkable without contrast. Spleen: Unremarkable without contrast. Adrenals/Urinary Tract: No adrenal mass. No contour deforming abnormality of either of the unenhanced kidneys. Chronic perinephric stranding is similar. No urinary stone or obstruction. There is no bladder thickening. There is trace air in the anterior bladder which could be due to recent catheterization or infection. This not seen on the earlier 2 studies from this month. Stomach/Bowel: As seen previously there has been a remote right hemicolectomy up to the distal transverse colon. The unused portion of the colon is contracted and empty. The gastric wall is contracted. Majority of the small bowel is normal in caliber with slightly dilated segments in the anterior left mid abdomen up to 2.7 cm caliber, favor ileus but could be due to a low-grade obstruction. The small bowel was diffusely much more dilated previously. There is a right mid abdominal loop ileostomy. There previously was a sizable parastomal hernia which has been reduced, with herniated bowel segments no longer seen. Vascular/Lymphatic: There is moderate to heavy aortoiliac calcific plaque. 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 No lymphadenopathy is seen.  Juxtarenal AAA again measures 4 cm. Reproductive: Uterus and bilateral adnexa are unremarkable. Other: Loculated fluid collection just deep to the anterolateral right mid abdominal wall measures 4.4 x 2.8 cm on 2:50, 20 Hounsfield units and could be a loculated seroma or hematoma, with infection not excluded. There is mild patchy fluid in the mesenteric folds, and scattered nonlocalizing fluid in a circumscribed area in the anterior  right lower abdominopelvic quadrant measuring 16.3 x 6.3 cm on 2:68, displacing bowel around it and containing fat and scattered fluid. I believe this is probably fat which was previously herniated and has been placed back into the abdominal cavity. This may be undergoing a degree of fat necrosis. There previously was a large complex right paraumbilical hernia just above this level which has been repaired. There are overlying midline skin staples. There is an ovoid subcutaneous fluid collection anteriorly along side the umbilicus in the right mid abdomen measuring 10.4 x 2.5 cm and 18 Hounsfield units on 2:59 which is probably a seroma. There is a hematoma underlying the incision subcutaneously measuring 4.7 x 2.7 cm on 2:63, and another subcutaneous hematoma in the anterior right mid abdomen below this level measuring 3.9 x 3.0 cm on 2:77, as well as body wall anasarca in the lateral abdominal walls and flanks and in the upper thighs. There is no free hemorrhage seen, no evidence of free air. Multiple pelvic phleboliths. Musculoskeletal: There are degenerative changes in the lumbar spine greatest at L5-S1, ankylosis over the anterior right SI joint. No acute or other significant osseous findings. IMPRESSION: 1. 4.4 x 2.8 cm loculated fluid collection just deep to the anterolateral right mid abdominal wall, could be a loculated seroma or hematoma, with infection not excluded. 2. 16.3 x 6.3 cm circumscribed area in the anterior right lower abdominopelvic quadrant containing fat and scattered fluid, probably fat which was previously herniated and has been placed back into the abdominal cavity. This  may be undergoing a degree of fat necrosis. 3. 10.4 x 2.5 cm subcutaneous fluid collection anteriorly along side the umbilicus in the right mid abdomen, probably a seroma. 4. 4.7 x 2.7 cm hematoma underlying the incision subcutaneously, and another 3.9 x 3.0 cm hematoma in the anterior right mid abdomen below this level. 5.  Parastomal and right paraumbilical hernia repairs as detailed above. 6. Body wall anasarca. 7. Slightly dilated small bowel segments in the anterior left mid abdomen up to 2.7 cm caliber, favor ileus but could be due to a low-grade obstruction. The small bowel was diffusely much more dilated previously. 8. Cholelithiasis. 9. Increased ground-glass opacities in the right middle and lower lobes which could be ground-glass edema or pneumonitis. 10. Cardiomegaly with partially visible ascending aortic sleeve repair, median sternotomy sutures, and partially visible thoracic aortic stent graft repair, maximum descending aortic caliber 4.6 cm. 11. Aortic atherosclerosis and stable AAA. Recommend CTA or MRA, as appropriate, in 6 months and referral to a vascular specialist if not already done. Reference: Journal of Vascular Surgery 67.1 (2018): 2-77. J Am Coll Radiol [phone]. Aortic Atherosclerosis (ICD10-I70.0). Electronically Signed   By: Denman Fischer M.D.   On: 09/03/2023 03:01   DG Chest Portable 1 View Result Date: 09/03/2023 CLINICAL DATA:  Shortness of breath. EXAM: PORTABLE CHEST 1 VIEW COMPARISON:  Aug 20, 2023 FINDINGS: Multiple sternal wires are present. The heart size and mediastinal contours are within normal limits. There is extensive stenting of the thoracic aorta. Low lung volumes are noted. Mild atelectasis and/or infiltrate is seen within the bilateral lung bases and mid right lung. No pleural effusion or pneumothorax is identified. Multilevel degenerative changes seen throughout the thoracic spine. IMPRESSION: Low lung volumes with mild bibasilar and mid right lung atelectasis and/or infiltrate. Electronically Signed   By: Virgle Grime M.D.   On: 09/03/2023 02:31   DG Chest Port 1 View Result Date: 08/20/2023 CLINICAL DATA:  NG placement. EXAM: PORTABLE CHEST 1 VIEW COMPARISON:  Chest radiograph dated 08/12/2023. FINDINGS: Enteric tube extends below the diaphragm into the stomach and  fold back on itself with tip in the region of the proximal stomach. There is shallow inspiration with bibasilar atelectasis. No pneumothorax. Stable cardiac silhouette. Median sternotomy wires and repair of the thoracic aorta. No acute osseous pathology. IMPRESSION: 1. Enteric tube with tip in the proximal stomach. 2. Shallow inspiration with bibasilar atelectasis. Electronically Signed   By: Angus Bark M.D.   On: 08/20/2023 15:36   DG Abd 1 View Result Date: 08/18/2023 CLINICAL DATA:  History of small bowel obstruction status post ileostomy with emesis EXAM: ABDOMEN - 1 VIEW COMPARISON:  CT abdomen and pelvis dated 08/12/2023 FINDINGS: The inferior rectum is not entirely included within the field of view. Gas-filled mildly dilated small bowel loops throughout the abdomen. No free air or pneumatosis. No abnormal radio-opaque calculi or mass effect. No acute or substantial osseous abnormality. The sacrum and coccyx are partially obscured by overlying bowel contents. Partially imaged thoracic aortic stent graft. Median sternotomy wires are nondisplaced. IMPRESSION: Gas-filled mildly dilated small bowel loops throughout the abdomen, which may represent ileus or persistent small bowel obstruction. Electronically Signed   By: Limin  Xu M.D.   On: 08/18/2023 12:32   CT CHEST ABDOMEN PELVIS WO CONTRAST Result Date: 08/12/2023 CLINICAL DATA:  Sepsis.  Small-bowel obstruction. EXAM: CT CHEST, ABDOMEN AND PELVIS WITHOUT CONTRAST TECHNIQUE: Multidetector CT imaging of the chest, abdomen and pelvis was performed following the standard protocol without IV  contrast. RADIATION DOSE REDUCTION: This exam was performed according to the departmental dose-optimization program which includes automated exposure control, adjustment of the mA and/or kV according to patient size and/or use of iterative reconstruction technique. COMPARISON:  Aug 06, 2023. FINDINGS: CT CHEST FINDINGS Cardiovascular: Status post stent graft repair of  thoracic aorta. Mild cardiomegaly. No pericardial effusion. Mediastinum/Nodes: Thyroid gland is unremarkable. Distal tip of feeding tube terminates in distal esophagus. No adenopathy. Lungs/Pleura: No pneumothorax is noted. Minimal bibasilar subsegmental atelectasis is noted with probable minimal pleural effusions. Musculoskeletal: No chest wall mass or suspicious bone lesions identified. CT ABDOMEN PELVIS FINDINGS Hepatobiliary: No cholelithiasis or biliary dilatation. Liver is unremarkable on these unenhanced images. Pancreas: Unremarkable. No pancreatic ductal dilatation or surrounding inflammatory changes. Spleen: Normal in size without focal abnormality. Adrenals/Urinary Tract: Adrenal glands are unremarkable. Kidneys are normal, without renal calculi, focal lesion, or hydronephrosis. Bladder is unremarkable. Stomach/Bowel: Stomach is unremarkable. Loop ileostomy is again noted in right lower quadrant. There is a large associated peristomal hernia which contains loops of small bowel, and appears to be resulting in severe obstruction of more proximal small bowel loops. Dilated small bowel loops are also noted within the peristomal hernia itself. Another smaller ventral hernia is noted in central portion of abdomen and periumbilical region with small bowel loops within it, also potentially causing obstruction. No definite colonic dilatation is noted. Vascular/Lymphatic: Aortic atherosclerosis. 5.2 cm infrarenal abdominal aortic aneurysm is noted. No enlarged abdominal or pelvic lymph nodes. Reproductive: Uterus and bilateral adnexa are unremarkable. Other: Minimal free fluid is noted in the pelvis. Musculoskeletal: No acute or significant osseous findings. IMPRESSION: There is again noted severe small bowel obstruction due to small bowel loops being incarcerated within 2 ventral hernias, including the large peristomal hernia involving loop ileostomy in right lower quadrant, as well as the large periumbilical  hernia. 5.2 cm infrarenal abdominal aortic aneurysm. Recommend follow-up CT or MR as appropriate in 6 months and referral to or continued care with vascular specialist. (Ref.: J Vasc Surg. 2018; 67:2-77 and J Am Coll Radiol 2013;10(10):789-794.) Minimal bibasilar subsegmental atelectasis with small pleural effusions. Aortic Atherosclerosis (ICD10-I70.0). Electronically Signed   By: Rosalene Colon M.D.   On: 08/12/2023 17:44   DG CHEST PORT 1 VIEW Result Date: 08/12/2023 CLINICAL DATA:  Catheter insertion EXAM: PORTABLE CHEST 1 VIEW COMPARISON:  Chest x-ray 08/10/2023 FINDINGS: Right upper extremity PICC terminates in the SVC. There is no pneumothorax. Enteric tube tip is in the distal esophagus 8 cm proximal from the diaphragm. This is unchanged. The cardiomediastinal silhouette is stable. The heart is enlarged. Aortic graft is present as well sternotomy wires. There are bands of atelectasis in the lower lungs. No new focal lung infiltrate, pleural effusion or pneumothorax. IMPRESSION: 1. Right upper extremity PICC terminates in the SVC. No pneumothorax. 2. Enteric tube tip is in the distal esophagus 8 cm proximal from the diaphragm. This is unchanged. Recommend advancement. Electronically Signed   By: Tyron Gallon M.D.   On: 08/12/2023 17:08   US  EKG SITE RITE Result Date: 08/12/2023 If Site Rite image not attached, placement could not be confirmed due to current cardiac rhythm.  ECHOCARDIOGRAM COMPLETE Result Date: 08/12/2023    ECHOCARDIOGRAM REPORT   Patient Name:   ADREA SHERPA [facility] Date of Exam: 08/12/2023 Medical Rec #:  [id number]     Height:       67.0 in Accession #:    [id number]    Weight:       204.8  lb Date of Birth:  04-19-1960     BSA:          2.043 m Patient Age:    62 years      BP:           92/63 mmHg Patient Gender: F             HR:           107 bpm. Exam Location:  [facility] Procedure: 2D Echo, Cardiac Doppler, Color Doppler and Intracardiac            Opacification Agent (Both Spectral  and Color Flow Doppler were            utilized during procedure). Indications:    Atrial Fibrillation  History:        Patient has prior history of Echocardiogram examinations.                 NSTEMI, Arrythmias:Atrial Fibrillation; Risk                 Factors:Hypertension.  Sonographer:    Willey Harrier Referring Phys: [id number] [facility]  Sonographer Comments: Technically difficult study due to poor echo windows. Image acquisition challenging due to patient body habitus. IMPRESSIONS  1. Left ventricular ejection fraction, by estimation, is 60 to 65%. The left ventricle has normal function. The left ventricle has no regional wall motion abnormalities. There is severe asymmetric left ventricular hypertrophy of the septal segment. Left  ventricular diastolic function could not be evaluated.  2. Right ventricular systolic function is normal. The right ventricular size is normal. There is normal pulmonary artery systolic pressure.  3. Left atrial size was severely dilated.  4. The mitral valve is normal in structure. Trivial mitral valve regurgitation. No evidence of mitral stenosis.  5. The aortic valve is tricuspid. There is moderate calcification of the aortic valve. Aortic valve regurgitation is not visualized. No aortic stenosis is present.  6. The inferior vena cava is normal in size with greater than 50% respiratory variability, suggesting right atrial pressure of 3 mmHg. FINDINGS  Left Ventricle: Left ventricular ejection fraction, by estimation, is 60 to 65%. The left ventricle has normal function. The left ventricle has no regional wall motion abnormalities. Strain was performed and the global longitudinal strain is indeterminate. The left ventricular internal cavity size was normal in size. There is severe asymmetric left ventricular hypertrophy of the septal segment. Left ventricular diastolic function could not be evaluated due to atrial fibrillation. Left ventricular diastolic function could not be  evaluated. Right Ventricle: The right ventricular size is normal. No increase in right ventricular wall thickness. Right ventricular systolic function is normal. There is normal pulmonary artery systolic pressure. The tricuspid regurgitant velocity is 2.50 m/s, and  with an assumed right atrial pressure of 3 mmHg, the estimated right ventricular systolic pressure is 28.0 mmHg. Left Atrium: Left atrial size was severely dilated. Right Atrium: Right atrial size was normal in size. Pericardium: There is no evidence of pericardial effusion. Mitral Valve: The mitral valve is normal in structure. Trivial mitral valve regurgitation. No evidence of mitral valve stenosis. MV peak gradient, 5.0 mmHg. The mean mitral valve gradient is 2.3 mmHg. Tricuspid Valve: The tricuspid valve is normal in structure. Tricuspid valve regurgitation is trivial. No evidence of tricuspid stenosis. Aortic Valve: The aortic valve is tricuspid. There is moderate calcification of the aortic valve. Aortic valve regurgitation is not visualized. No aortic stenosis is present. Aortic valve peak gradient  measures 7.7 mmHg. Pulmonic Valve: The pulmonic valve was normal in structure. Pulmonic valve regurgitation is not visualized. No evidence of pulmonic stenosis. Aorta: The aortic root and ascending aorta are structurally normal, with no evidence of dilitation. Venous: The inferior vena cava is normal in size with greater than 50% respiratory variability, suggesting right atrial pressure of 3 mmHg. IAS/Shunts: No atrial level shunt detected by color flow Doppler. Additional Comments: 3D was performed not requiring image post processing on an independent workstation and was indeterminate.  LEFT VENTRICLE PLAX 2D LVIDd:         3.80 cm LVIDs:         2.40 cm LV PW:         1.30 cm LV IVS:        1.80 cm LVOT diam:     2.10 cm LV SV:         54 LV SV Index:   26 LVOT Area:     3.46 cm  IVC IVC diam: 1.70 cm LEFT ATRIUM              Index        RIGHT  ATRIUM           Index LA diam:        4.80 cm  2.35 cm/m   RA Area:     11.10 cm LA Vol (A2C):   63.7 ml  31.18 ml/m  RA Volume:   21.70 ml  10.62 ml/m LA Vol (A4C):   112.0 ml 54.83 ml/m LA Biplane Vol: 86.9 ml  42.54 ml/m  AORTIC VALVE AV Area (Vmax): 2.66 cm AV Vmax:        138.67 cm/s AV Peak Grad:   7.7 mmHg LVOT Vmax:      106.50 cm/s LVOT Vmean:     66.950 cm/s LVOT VTI:       0.156 m  AORTA Ao Root diam: 2.80 cm Ao Asc diam:  3.20 cm MITRAL VALVE                TRICUSPID VALVE MV Area (PHT): 3.95 cm     TR Peak grad:   25.0 mmHg MV Area VTI:   3.18 cm     TR Vmax:        250.00 cm/s MV Peak grad:  5.0 mmHg MV Mean grad:  2.3 mmHg     SHUNTS MV Vmax:       1.12 m/s     Systemic VTI:  0.16 m MV Vmean:      61.9 cm/s    Systemic Diam: 2.10 cm MV Decel Time: 192 msec MV E velocity: 104.50 cm/s Vishnu Priya Mallipeddi Electronically signed by Lucetta Russel Mallipeddi Signature Date/Time: 08/12/2023/12:50:15 PM    Final    NM Pulmonary Perfusion Result Date: 08/10/2023 CLINICAL DATA:  Chest pain with shortness of breath for a few days. Concern for pulmonary embolism. EXAM: NUCLEAR MEDICINE PERFUSION LUNG SCAN TECHNIQUE: Perfusion images were obtained in multiple projections after intravenous injection of radiopharmaceutical. No ventilation images obtained. RADIOPHARMACEUTICALS:  4.2 mCi Tc-43m MAA IV COMPARISON:  Bilateral lower extremity venous Doppler ultrasound and chest radiographs same date. Chest CTA 10/02/2022 FINDINGS: No focal perfusion defects are demonstrated. The pulmonary perfusion appears within normal limits, without evidence of acute pulmonary embolism. IMPRESSION: No evidence of acute pulmonary embolism on perfusion scintigraphy by PISAPED criteria. Electronically Signed   By: Elmon Hagedorn M.D.   On: 08/10/2023 16:40   DG CHEST PORT  1 VIEW Result Date: 08/10/2023 CLINICAL DATA:  Chest pain. EXAM: PORTABLE CHEST 1 VIEW COMPARISON:  Chest radiograph dated 01/19/2022. FINDINGS: Enteric  tube with tip in the region of the GE junction. Recommend further advancing by additional 8 cm. Shallow inspiration with bibasilar atelectasis. No focal consolidation, pleural effusion, pneumothorax. Stable cardiac silhouette. Status post repair of the thoracic aorta median sternotomy wires. No acute osseous pathology. IMPRESSION: 1. Enteric tube with tip in the region of the GE junction. Recommend further advancing by additional 8 cm. 2. Shallow inspiration with bibasilar atelectasis. Electronically Signed   By: Angus Bark M.D.   On: 08/10/2023 14:23   US  Venous Img Lower Bilateral (DVT) Result Date: 08/10/2023 CLINICAL DATA:  Pulmonary emboli EXAM: BILATERAL LOWER EXTREMITY VENOUS DOPPLER ULTRASOUND TECHNIQUE: Gray-scale sonography with compression, as well as color and duplex ultrasound, were performed to evaluate the deep venous system(s) from the level of the common femoral vein through the popliteal and proximal calf veins. COMPARISON:  None Available. FINDINGS: VENOUS Normal compressibility of the common femoral, superficial femoral, and popliteal veins, as well as the visualized calf veins. Visualized portions of profunda femoral vein and great saphenous vein unremarkable. No filling defects to suggest DVT on grayscale or color Doppler imaging. Doppler waveforms show normal direction of venous flow, normal respiratory plasticity and response to augmentation. Limited views of the contralateral common femoral vein are unremarkable. OTHER None. Limitations: none IMPRESSION: Negative. Electronically Signed   By: Reagan Camera M.D.   On: 08/10/2023 12:51   DG Abd 2 Views Result Date: 08/10/2023 CLINICAL DATA:  Follow-up small bowel obstruction. EXAM: ABDOMEN - 2 VIEW COMPARISON:  08/07/23 FINDINGS: Enteric tube tip is below the level of the GE junction. Signs of previous median sternotomy. 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 Persistent dilated loops of small bowel are identified measuring up to 5.1  cm. Paucity of colonic/rectal gas. No supine evidence for pneumoperitoneum. IMPRESSION: Persistent dilated loops of small bowel compatible with small bowel obstruction. Electronically Signed   By: Kimberley Penman M.D.   On: 08/10/2023 06:50   DG Abd Portable 1 View Result Date: 08/07/2023 EXAM: 1 VIEW XRAY OF THE ABDOMEN SUPINE 08/07/2023 02:27:00 AM COMPARISON: Earlier today. CLINICAL HISTORY: NG tube placement. NG tube placement IMAGE #2 repositioned NG. FINDINGS: BOWEL: The bowel gas pattern is nonspecific. No bowel obstruction. PERITONEUM AND SOFT TISSUES: No abnormal calcifications. BONES: No acute osseous abnormality. LINES AND TUBES: Enteric tube terminates in the distal gastric antrum. IMPRESSION: 1. Enteric tube terminates in the distal gastric antrum. Electronically signed by: Zadie Herter MD 08/07/2023 02:48 AM EDT RP Workstation: [id number]   DG Abd Portable 1 View Result Date: 08/07/2023 EXAM: 1 VIEW XRAY OF THE ABDOMEN SUPINE 08/07/2023 01:53:00 AM COMPARISON: CT abdomen / pelvis dated 08/06/2023. CLINICAL HISTORY: NG tube placement. FINDINGS: BOWEL: Dilated loops of small bowel in the central abdomen, suggesting small bowel obstruction. PERITONEUM AND SOFT TISSUES: No abnormal calcifications. BONES: No acute osseous abnormality. LINES AND TUBES: Enteric tube terminates in the proximal duodenum. VASCULATURE: Thoracic aortic stent. MEDIASTINUM: Median sternotomy. Postsurgical changes related to prior CABG (coronary artery bypass graft). IMPRESSION: 1. Enteric tube terminates in the proximal duodenum. 2. Dilated loops of small bowel in the central abdomen, suggesting small bowel obstruction. Electronically signed by: Zadie Herter MD 08/07/2023 01:58 AM EDT RP Workstation: [id number]   CT ABDOMEN PELVIS WO CONTRAST Result Date: 08/07/2023 CLINICAL DATA:  Abdominal hernia with small bowel obstruction. Unable to keep anything down. EXAM: CT ABDOMEN AND  PELVIS WITHOUT CONTRAST TECHNIQUE:  Multidetector CT imaging of the abdomen and pelvis was performed following the standard protocol without IV contrast. RADIATION DOSE REDUCTION: This exam was performed according to the departmental dose-optimization program which includes automated exposure control, adjustment of the mA and/or kV according to patient size and/or use of iterative reconstruction technique. COMPARISON:  CTA chest, abdomen and pelvis 10/02/2022, CT abdomen and pelvis without contrast 06/01/2021. FINDINGS: Lower chest: Cardiac size is normal. Lung bases are clear with mild chronic elevation of the right hemidiaphragm. Sternotomy sutures are again noted with partially visualized ascending aortic tube graft repair and a partially visualized descending aortic stent graft. Maximum descending aortic caliber 4.6 cm which seems unchanged. Hepatobiliary: Shrinking cyst noted in hepatic segment 1 measuring 1.5 cm, previously 3.1 cm. Otherwise no focal liver abnormality is seen without contrast. No gallstones, gallbladder wall thickening, or biliary dilatation. Pancreas: Partially atrophic. No focal abnormality is seen without contrast. Spleen: Unremarkable. Adrenals/Urinary Tract: Stable 1.8 cm left adrenal genu nodule. No right adrenal abnormality. No contour deforming mass of the kidneys is seen. There is no urinary stone or obstruction. Bladder is unremarkable for the degree of distension. Stomach/Bowel: The stomach is mildly distended with backed up fluid. No wall thickening. High-grade small-bowel obstruction. There is diffuse dilatation up to 5 cm, obstruction point likely somewhere in the distal third of the ileum. The terminal ileal segments are collapsed and contained within a peristomal hernia in the anterolateral right mid abdomen where there is a loop ileostomy. There has been a prior right hemicolectomy with decompressed Hartmann's pouch beginning in the distal transverse colon. There is a second, larger hernia sac medial to the  peristomal hernia with the hernia mouth 5 cm diameter. The transitional segment in this case is draped over the lateral wall of the mouth of this second hernia, best seen on 2:50 and adjacent images. This hernia sac contains dilated as well as decompressed segments and trace fluid, with the hernia mouth extending to the umbilicus. Above this level are several supraumbilical right and left of midline fat hernias but no other hernias contain bowel. No bowel pneumatosis, free air or portal venous gas are seen Vascular/Lymphatic: Patchy aortoiliac atherosclerosis. 5.2 cm infrarenal fusiform AAA is again noted and is unchanged. No adenopathy. Reproductive: Status post hysterectomy. No adnexal masses. Other: Mild scattered fluid in the mesenteric folds, minimal fluid in hernia sacs. Trace ascites in pelvis. No free air. There is subcutaneous stranding in right upper abdominal wall above the level of hernias, which could be traumatic or due to cellulitis. Small inguinal fat hernias are again noted.  There is no free air. Musculoskeletal: Degenerative changes lumbar spine including advanced facet hypertrophy L4-5, degenerative disc space loss L5-S1 with spondylosis. No acute or other significant osseous findings. Ankylosis right SI joint. IMPRESSION: 1. High-grade small-bowel obstruction with transition point likely somewhere in the distal third of the ileum. 2. The terminal ileal segments are collapsed and contained within a peristomal hernia in the anterolateral right mid abdomen where there is a loop ileostomy. 3. There is a second, larger hernia sac medial to the peristomal hernia with the hernia mouth 5 cm diameter. The transitional segment in this case is draped over the lateral wall of the mouth of this second hernia. 4. No bowel pneumatosis, free air or portal venous gas are seen. 5. Mild scattered fluid in the mesenteric folds and in both hernia sacs, trace ascites in the pelvis. 6. Subcutaneous stranding in the  right upper abdominal  wall above the level of hernias, which could be traumatic or due to cellulitis. 7. Aortic atherosclerosis with 5.2 cm infrarenal fusiform AAA, unchanged. Follow-up as indicated. 8. Prior right hemicolectomy with decompressed Hartmann's pouch beginning in the distal transverse colon. 9. Stable 1.8 cm left adrenal genu nodule. Aortic Atherosclerosis (ICD10-I70.0). Electronically Signed   By: Denman Fischer M.D.   On: 08/07/2023 00:19      Consults: Case discussed with Dr. Alethea Andes of general surgery.  He stated that this could be managed medically at this time and general surgery would see in the a.m.   Reassessment and Plan:   Ultimately, uncertain whether patient's infectious syndrome is coming from pulmonary infection given her shortness of breath and findings of pneumonia/pneumonitis or from intra-abdominal fluid collection.  General surgery to see in the morning, they agreed with antibiotics overnight Consult medicine for admission.  Clinical Impression:  1. Sepsis, due to unspecified organism, unspecified whether acute organ dysfunction present [employer] [facility] [employer])      Admit   Final Clinical Impression(s) / ED Diagnoses Final diagnoses:  Sepsis, due to unspecified organism, unspecified whether acute organ dysfunction present [facility])    Rx / DC Orders ED Discharge Orders     None         Onetha Bile, MD 09/03/23 [phone]

## 2023-09-04 ENCOUNTER — Encounter (HOSPITAL_COMMUNITY): Payer: Self-pay | Admitting: Internal Medicine

## 2023-09-04 DIAGNOSIS — I5033 Acute on chronic diastolic (congestive) heart failure: Secondary | ICD-10-CM | POA: Diagnosis not present

## 2023-09-04 LAB — COMPREHENSIVE METABOLIC PANEL WITH GFR
ALT: 14 U/L (ref 0–44)
AST: 13 U/L — ABNORMAL LOW (ref 15–41)
Albumin: 2.5 g/dL — ABNORMAL LOW (ref 3.5–5.0)
Alkaline Phosphatase: 71 U/L (ref 38–126)
Anion gap: 9 (ref 5–15)
BUN: 21 mg/dL (ref 8–23)
CO2: 25 mmol/L (ref 22–32)
Calcium: 8.2 mg/dL — ABNORMAL LOW (ref 8.9–10.3)
Chloride: 106 mmol/L (ref 98–111)
Creatinine, Ser: 2.01 mg/dL — ABNORMAL HIGH (ref 0.44–1.00)
GFR, Estimated: 28 mL/min — ABNORMAL LOW (ref >60)
Glucose, Bld: 108 mg/dL — ABNORMAL HIGH (ref 70–99)
Potassium: 4.3 mmol/L (ref 3.5–5.1)
Sodium: 140 mmol/L (ref 135–145)
Total Bilirubin: 1.3 mg/dL — ABNORMAL HIGH (ref 0.0–1.2)
Total Protein: 6.3 g/dL — ABNORMAL LOW (ref 6.5–8.1)

## 2023-09-04 LAB — CBC WITH DIFFERENTIAL/PLATELET
Abs Immature Granulocytes: 0.07 10*3/uL (ref 0.00–0.07)
Basophils Absolute: 0.1 10*3/uL (ref 0.0–0.1)
Basophils Relative: 0 %
Eosinophils Absolute: 0.2 10*3/uL (ref 0.0–0.5)
Eosinophils Relative: 2 %
HCT: 26.8 % — ABNORMAL LOW (ref 36.0–46.0)
Hemoglobin: 7.8 g/dL — ABNORMAL LOW (ref 12.0–15.0)
Immature Granulocytes: 1 %
Lymphocytes Relative: 9 %
Lymphs Abs: 1.2 10*3/uL (ref 0.7–4.0)
MCH: 30.7 pg (ref 26.0–34.0)
MCHC: 29.1 g/dL — ABNORMAL LOW (ref 30.0–36.0)
MCV: 105.5 fL — ABNORMAL HIGH (ref 80.0–100.0)
Monocytes Absolute: 1.6 10*3/uL — ABNORMAL HIGH (ref 0.1–1.0)
Monocytes Relative: 13 %
Neutro Abs: 9.6 10*3/uL — ABNORMAL HIGH (ref 1.7–7.7)
Neutrophils Relative %: 75 %
Platelets: 306 10*3/uL (ref 150–400)
RBC: 2.54 MIL/uL — ABNORMAL LOW (ref 3.87–5.11)
RDW: 16 % — ABNORMAL HIGH (ref 11.5–15.5)
WBC: 12.6 10*3/uL — ABNORMAL HIGH (ref 4.0–10.5)
nRBC: 0 % (ref 0.0–0.2)

## 2023-09-04 LAB — PHOSPHORUS: Phosphorus: 3.7 mg/dL (ref 2.5–4.6)

## 2023-09-04 LAB — MAGNESIUM: Magnesium: 1.9 mg/dL (ref 1.7–2.4)

## 2023-09-04 LAB — HEPARIN LEVEL (UNFRACTIONATED): Heparin Unfractionated: >1.1 [IU]/mL — ABNORMAL HIGH (ref 0.30–0.70)

## 2023-09-04 LAB — APTT: aPTT: 80 s — ABNORMAL HIGH (ref 24–36)

## 2023-09-04 MED ORDER — CEFAZOLIN SODIUM-DEXTROSE 2-4 GM/100ML-% IV SOLN: 2.0000 g | Freq: Three times a day (TID) | INTRAVENOUS | Status: DC

## 2023-09-04 MED ORDER — MELATONIN 5 MG PO TABS
5.0000 mg | ORAL_TABLET | Freq: Every evening | ORAL | Status: AC | PRN
  Administered 2023-09-04 – 2023-09-06 (×3): 5 mg via ORAL
  Filled 2023-09-04 (×3): qty 1

## 2023-09-04 MED ORDER — CEFAZOLIN SODIUM-DEXTROSE 2-4 GM/100ML-% IV SOLN
2.0000 g | Freq: Two times a day (BID) | INTRAVENOUS | Status: DC
  Administered 2023-09-04 – 2023-09-05 (×4): 2 g via INTRAVENOUS
  Filled 2023-09-04 (×5): qty 100

## 2023-09-04 MED ORDER — HEPARIN (PORCINE) 25000 UT/250ML-% IV SOLN
1400.0000 [IU]/h | INTRAVENOUS | Status: AC
  Administered 2023-09-04 – 2023-09-06 (×4): 1250 [IU]/h via INTRAVENOUS
  Administered 2023-09-07 – 2023-09-10 (×4): 1400 [IU]/h via INTRAVENOUS
  Filled 2023-09-04 (×8): qty 250

## 2023-09-04 NOTE — Progress Notes (Signed)
 PHARMACY NOTE:  ANTIMICROBIAL RENAL DOSAGE ADJUSTMENT  Current antimicrobial regimen includes a mismatch between antimicrobial dosage and estimated renal function.  As per policy approved by the Pharmacy & Therapeutics and Medical Executive Committees, the antimicrobial dosage will be adjusted accordingly.  Current antimicrobial dosage:  cefazolin  2g IV q8h  Indication: cellulitis  Renal Function:  Estimated Creatinine Clearance: 34.6 mL/min (A) (by C-G formula based on SCr of 2.01 mg/dL (H)). []      On intermittent HD, scheduled: []      On CRRT    Antimicrobial dosage has been changed to:  cefazolin  2g IV q12h  Additional comments: N/A   Thank you for allowing pharmacy to be a part of this patient's care.  Roselee Cong, PharmD Clinical Pharmacist  5/30/20259:00 AM

## 2023-09-04 NOTE — Evaluation (Signed)
 Occupational Therapy Evaluation Patient Details Name: Dorothy Harris MRN: [id number] DOB: 1960/12/07 Today's Date: 09/04/2023   History of Present Illness   Dorothy Harris is a 63 y.o. 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  Recently admitted to [facility] from 5/1-5/24 for high-grade SBO status post ex lap with lysis of adhesions and resection of small bowel with end ileostomy revision on 5/15.  Also treated for AKI on CKD stage IV due to volume depletion/dehydration treated with IV fluids and acute anemia treated with a total of 3 units PRBCs.  Started on oral metoprolol  for A-fib and Eliquis was resumed on discharge and was discharged to SNF. ED presentation with leaking iliostomy bag. Re-admitted to [facility] with dx sepsis, CHF, ARF, pulmonary edema.     Clinical Impressions Patient readmitted from short term snf level rehab but prior to 08/06/23 patient reports she was mod I in home with quad cane for ambulation and driving. Currently, patient presents with deficits outlined below (see OT Problem List for details) mot significantly pain, edema L UE, generalized muscle weakness LE> UE's, decreased coordination, activity tolerance, and balance impacting BADL's and functional mobility. Patient requires ongoing Acute OT services to progress function and allow for discharge. Patient will benefit from continued inpatient follow up therapy, <3 hours/day.       If plan is discharge home, recommend the following:   Two people to help with walking and/or transfers;Two people to help with bathing/dressing/bathroom;Assistance with cooking/housework;Direct supervision/assist for medications management;Direct supervision/assist for financial management;Assist for transportation;Help with stairs or ramp for  entrance     Functional Status Assessment   Patient has had a recent decline in their functional status and demonstrates the ability to make significant improvements in function in a reasonable and predictable amount of time.     Equipment Recommendations   None recommended by OT      Precautions/Restrictions   Precautions Precautions: Fall Recall of Precautions/Restrictions: Intact Precaution/Restrictions Comments: BP effective on  L LE, R LE weakness, ostomy Restrictions Weight Bearing Restrictions Per Provider Order: No     Mobility Bed Mobility Overal bed mobility: Needs Assistance Bed Mobility: Supine to Sit, Sit to Supine     Supine to sit: +2 for physical assistance, +2 for safety/equipment, HOB elevated, Used rails, Mod assist Sit to supine: Mod assist, Max assist, +2 for physical assistance, +2 for safety/equipment, HOB elevated, Used rails   General bed mobility comments: decreased LE management due to weakness    Transfers Overall transfer level: Needs assistance Equipment used: Rolling walker (2 wheels) Transfers: Sit to/from Stand, Bed to chair/wheelchair/BSC Sit to Stand: +2 physical assistance, +2 safety/equipment, Mod assist, Max assist, From elevated surface           General transfer comment: R LE weakness and generalized fatigue limited transfer OOB this session, rec STEDY next visit to trial      Balance Overall balance assessment: Needs assistance Sitting-balance support: Feet supported, No upper extremity supported Sitting balance-Leahy Scale: Fair     Standing balance support: Bilateral upper extremity supported, Reliant on assistive device for balance Standing balance-Leahy Scale: Poor Standing balance comment: concern for R LE buckling thus supported with blocking                           ADL either  performed or assessed with clinical judgement   ADL Overall ADL's : Needs assistance/impaired Eating/Feeding:  Modified independent;Bed level   Grooming: Wash/dry hands;Wash/dry face;Sitting;Contact guard assist Grooming Details (indicate cue type and reason): tolerated EOB Upper Body Bathing: Minimal assistance;Bed level   Lower Body Bathing: Maximal assistance;Bed level   Upper Body Dressing : Minimal assistance;Sitting   Lower Body Dressing: Total assistance;Bed level   Toilet Transfer:  (unable to progress past standing from EOB +2)   Toileting- Clothing Manipulation and Hygiene: Total assistance;Bed level Toileting - Clothing Manipulation Details (indicate cue type and reason): has ostomy and purewick in place currently     Functional mobility during ADLs: Moderate assistance;+2 for physical assistance;+2 for safety/equipment;Rolling walker (2 wheels);Maximal assistance (STS at EOB only this session) General ADL Comments: decreased functioanl reach past upper LE's     Vision Baseline Vision/History: 0 No visual deficits Ability to See in Adequate Light: 0 Adequate Patient Visual Report: No change from baseline Vision Assessment?: No apparent visual deficits            Pertinent Vitals/Pain Pain Assessment Pain Assessment: Faces Faces Pain Scale: Hurts little more Facial Expression: Relaxed, neutral Body Movements: Absence of movements Muscle Tension: Tense, rigid Compliance with ventilator (intubated pts.): N/A Vocalization (extubated pts.): Talking in normal tone or no sound CPOT Total: 1 Pain Location: R LE Pain Descriptors / Indicators: Discomfort Pain Intervention(s): Monitored during session, Repositioned, Relaxation     Extremity/Trunk Assessment Upper Extremity Assessment Upper Extremity Assessment: Right hand dominant;Generalized weakness;LUE deficits/detail LUE Deficits / Details: +2 edema L UE, decreased strength 3-/5 grossly LUE Coordination: decreased fine motor   Lower Extremity Assessment Lower Extremity Assessment: Defer to PT evaluation   Cervical /  Trunk Assessment Cervical / Trunk Assessment: Normal   Communication Communication Communication: No apparent difficulties   Cognition Arousal: Alert Behavior During Therapy: WFL for tasks assessed/performed Cognition: No apparent impairments                               Following commands: Intact       Cueing  General Comments   Cueing Techniques: Verbal cues  ostomy, SpO2 96% on 2 ltrs via [place]           Home Living Family/patient expects to be discharged to:: Skilled nursing facility Living Arrangements: Alone Available Help at Discharge: Family;Available 24 hours/day Type of Home: House Home Access: Stairs to enter [employer] of Steps: 1 Entrance Stairs-Rails: Left Home Layout: One level     Bathroom Shower/Tub: Chief Strategy Officer: Handicapped height Bathroom Accessibility: Yes How Accessible: Accessible via walker Home Equipment: Rolling Walker (2 wheels);Cane - quad;Shower seat          Prior Functioning/Environment Prior Level of Function : Independent/Modified Independent;Driving             Mobility Comments: Community ambulation using Quad-cane, drives ADLs Comments: Independent    OT Problem List: Decreased strength;Decreased range of motion;Decreased activity tolerance;Impaired balance (sitting and/or standing);Decreased coordination;Decreased safety awareness;Decreased knowledge of use of DME or AE;Decreased knowledge of precautions;Cardiopulmonary status limiting activity;Obesity;Impaired UE functional use;Pain;Increased edema   OT Treatment/Interventions: Self-care/ADL training;Therapeutic exercise;Neuromuscular education;Energy conservation;DME and/or AE instruction;Therapeutic activities;Patient/family education;Balance training      OT Goals(Current goals can be found in the care plan section)   Acute Rehab OT Goals Patient Stated Goal: to get stronger OT Goal Formulation: With patient Time  For Goal Achievement: 09/18/23 Potential to  Achieve Goals: Good ADL Goals Pt Will Perform Upper Body Bathing: with set-up;sitting Pt Will Perform Lower Body Bathing: with mod assist;sit to/from stand Pt Will Perform Upper Body Dressing: with set-up;sitting Pt Will Perform Lower Body Dressing: with mod assist;sit to/from stand Pt Will Transfer to Toilet: with mod assist;with +2 assist;bedside commode Pt/caregiver will Perform Home Exercise Program: Increased strength;Both right and left upper extremity;With written HEP provided;Independently   OT Frequency:  Min 2X/week    Co-evaluation PT/OT/SLP Co-Evaluation/Treatment: Yes Reason for Co-Treatment: Complexity of the patient's impairments (multi-system involvement);For patient/therapist safety PT goals addressed during session: Mobility/safety with mobility;Balance;Proper use of DME OT goals addressed during session: ADL's and self-care;Proper use of Adaptive equipment and DME      AM-PAC OT "6 Clicks" Daily Activity     Outcome Measure Help from another person eating meals?: A Little Help from another person taking care of personal grooming?: A Little Help from another person toileting, which includes using toliet, bedpan, or urinal?: Total Help from another person bathing (including washing, rinsing, drying)?: A Lot Help from another person to put on and taking off regular upper body clothing?: A Little Help from another person to put on and taking off regular lower body clothing?: Total 6 Click Score: 13   End of Session Equipment Utilized During Treatment: Gait belt;Rolling walker (2 wheels);Oxygen Nurse Communication: Mobility status;Need for lift equipment  Activity Tolerance: Patient limited by fatigue;Patient limited by pain Patient left: in bed;with call bell/phone within reach;with bed alarm set;with nursing/sitter in room  OT Visit Diagnosis: Unsteadiness on feet (R26.81);Muscle weakness (generalized) (M62.81);Pain Pain  - Right/Left: Right Pain - part of body: Knee                Time: 1018-1050 OT Time Calculation (min): 32 min Charges:  OT General Charges $OT Visit: 1 Visit OT Evaluation $OT Eval Moderate Complexity: 1 Mod OT Treatments $Self Care/Home Management : 8-22 mins  Ananda Sitzer OT/L Acute Rehabilitation Department  [phone]  09/04/2023, 12:43 PM

## 2023-09-04 NOTE — Plan of Care (Signed)
  Problem: Clinical Measurements: Goal: Will remain free from infection Outcome: Not Progressing Goal: Diagnostic test results will improve Outcome: Not Progressing   Problem: Skin Integrity: Goal: Risk for impaired skin integrity will decrease Outcome: Not Progressing

## 2023-09-04 NOTE — Progress Notes (Signed)
 PROGRESS NOTE    Dorothy Harris  ZOX:[id number] DOB: 1960-05-19 DOA: 09/03/2023 PCP: [name], MD  Chief Complaint  Patient presents with   Shortness of Breath    Brief Narrative:    Dorothy Harris is Dorothy Harris 63 y.o. 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  Recently admitted to [facility] from 5/1-5/24 for high-grade SBO status post ex lap with lysis of adhesions and resection of small bowel with end ileostomy revision on 5/15.  Also treated for AKI on CKD stage IV due to volume depletion/dehydration treated with IV fluids and acute anemia treated with Dequann Vandervelden total of 3 units PRBCs.  Started on oral metoprolol  for Kimberlin Scheel-fib and Eliquis was resumed on discharge.  He was discharged to SNF.   Patient presents to the ED today from SNF for evaluation of leaking ileostomy bag.  Found to be septic with imaging notable for multiple abdominal fluid collections and volume overload.  Admitted for sepsis and concern for infection related to abdominal fluid collection.    Surgery has been consulted.    Assessment & Plan:   Principal Problem:   Acute on chronic heart failure with preserved ejection fraction (HFpEF) (HCC) Active Problems:   Acute hypoxemic respiratory failure (HCC)   PAF (paroxysmal atrial fibrillation) (HCC)   Essential hypertension   Stage 4 chronic kidney disease (HCC)   Ileus (HCC)   Edema of left upper extremity   QT prolongation   Sepsis (HCC)  Systemic Inflammatory Response Syndrome Afebrile, but leukocytosis, tachycardia, tachypnea at presentation - per discussion with surgery, low suspicion intraabdominal fluid collections are infected.  Another potential infectious source are ground glass opacities in lungs, but suspect these maybe more likely due  to volume pulmonary edema?  Volume overload contributing to tachycardia/tachypnea at presentation. Currently narrowed to ancef  to cover mild cellulitis around incision Blood cultures pending  Multiple abdominal fluid collections concerning (low suspicion for abscess, suspected seromas/hematomas) Ileus versus low-grade SBO Ostomy leakage History of recent high-grade SBO status post ex lap with lysis of adhesions and resection of small bowel with end ileostomy revision on 5/15  No lactic acidosis or hypotension.    General surgery consulted, appreciate recs.  Mild cellulitis around incision, fluid collections suspected seromas/hematomas - no plan for surgery Trend WBC count and follow-up blood cultures.   Acute on chronic HFpEF Acute hypoxemic respiratory failure Pulmonary Edema Anasarca Volume Overload Grossly overloaded with bilateral LE edema.  CT abd/pelvis with ground glass opacities in R middle and lower lobes (suspect ground glass edema given her clinical picture) ~ 86 kg on 5/4.   103 kg on 5/29.  96.4 kg today.  Net negative 3.1 L. Will diurese as tolerated (low albumin , will give some albumin  as well)  Echo 08/2023 with EF 60-65%, no RWMA.  Severe asymmetric LVH.  Diastolic function could not be evaluated.  Trivial MR.  Moderate calcification of AV.   Strict I/O, daily weights  Ground-glass opacities in the right middle and lower lobes  Suspect due to volume overload rather than infectious cause Diuresis as above Negative covid, flu, RSV   Left upper extremity edema  Acute Superficial Vein Thrombosis of L Cephalic Vein Supportive care  Troponin elevation Mild elevation, she's without chest pain.  EKG with artifact, but sinus (p waves difficult to see, but I think  present) tach, isolated borderline ST elevation in lead III.  T wave inversions in I, aVL.  Similar to prior EKG. Low suspicion for ACS.  Will trend.     QT prolongation QTc 597 on EKG and patient is on  amiodarone .  Potassium is within normal range.  Check magnesium  level and replace if low.  Avoid QT prolonging drugs and follow-up repeat EKG in the morning.   Paroxysmal Kayton Ripp-fib Currently in sinus tachycardia (p waves difficult to appreciate, appear to be present).   Heparin  gtt for now.  Continue metoprolol .   Holding amiodarone  at this time due to QT prolongation.   Repeat EKG for QT eval    CKD stage IV Creatinine 2.0, stable.  Continue to monitor renal function with diuresis.   Hypertension Follow with diuresis, metop   Hyperlipidemia Continue Lipitor .   Anemia Treated with 3 units PRBCs during recent admission Hb fluctuating, will trend  Follow iron, ferritin, b12, folate   GERD Continue Protonix .   Incidental stable AAA Patient will need outpatient follow-up with vascular surgery for follow-up CTA or MRA in 6 months      DVT prophylaxis: SCD Code Status: DNR Family Communication: none Disposition:   Status is: Inpatient Remains inpatient appropriate because: need for ongoing inpatient care   Consultants:  Surgery - EDP discussed with Dr. Alethea Andes  Procedures:  none  Antimicrobials:  Anti-infectives (From admission, onward)    Start     Dose/Rate Route Frequency Ordered Stop   09/04/23 1600  vancomycin  (VANCOCIN ) IVPB 1000 mg/200 mL premix        1,000 mg 200 mL/hr over 60 Minutes Intravenous Every 36 hours 09/03/23 0655     09/03/23 1600  metroNIDAZOLE  (FLAGYL ) IVPB 500 mg        500 mg 100 mL/hr over 60 Minutes Intravenous Every 12 hours 09/03/23 0643     09/03/23 1500  ceFEPIme  (MAXIPIME ) 2 g in sodium chloride  0.9 % 100 mL IVPB        2 g 200 mL/hr over 30 Minutes Intravenous Every 12 hours 09/03/23 0655     09/03/23 0400  metroNIDAZOLE  (FLAGYL ) IVPB 500 mg        500 mg 100 mL/hr over 60 Minutes Intravenous  Once 09/03/23 0358 09/03/23 0535   09/03/23 0330  vancomycin  (VANCOREADY) IVPB 1750 mg/350 mL        1,750 mg 175 mL/hr over 120 Minutes  Intravenous  Once 09/03/23 0325 09/03/23 0608   09/03/23 0300  ceFEPIme  (MAXIPIME ) 2 g in sodium chloride  0.9 % 100 mL IVPB        2 g 200 mL/hr over 30 Minutes Intravenous  Once 09/03/23 0251 09/03/23 0336       Subjective: Swelling feels better  Objective: Vitals:   09/04/23 0321 09/04/23 0400 09/04/23 0500 09/04/23 0600  BP:  126/81 119/80 99/81  Pulse:  90 95 (!) 107  Resp:  17 18 20   Temp: 98.3 F (36.8 C)     TempSrc: Oral     SpO2:  99% 98% 98%  Weight:   96.4 kg   Height:        Intake/Output Summary (Last 24 hours) at 09/04/2023 0814 Last data filed at 09/04/2023 0600 Gross per 24 hour  Intake 834.71 ml  Output 4055 ml  Net -3220.29 ml   Filed Weights   09/03/23 0131 09/04/23 0500  Weight: 103 kg 96.4 kg    Examination:  General: No acute distress. Cardiovascular: irregularly irregular Lungs: Clear  to auscultation bilaterally Abdomen: midline surgical incision, ileostomy, not particularly TTP Neurological: Alert and oriented 3. Moves all extremities 4 with equal strength. Cranial nerves II through XII grossly intact. Extremities: improving LE edema, but still significant 2+ edema to thighs/hips   Data Reviewed: I have personally reviewed following labs and imaging studies  CBC: Recent Labs  Lab 08/29/23 0321 09/03/23 0149 09/03/23 0806 09/04/23 0321  WBC 8.9 15.4* 15.1* 12.6*  NEUTROABS  --  12.4*  --  9.6*  HGB 8.6* 9.1* 8.4* 7.8*  HCT 28.0* 30.8* 29.0* 26.8*  MCV 101.4* 102.7* 106.6* 105.5*  PLT 310 373 361 306    Basic Metabolic Panel: Recent Labs  Lab 09/03/23 0149 09/03/23 0806 09/04/23 0321  NA 138 140 140  K 4.4 4.3 4.3  CL 103 107 106  CO2 25 23 25   GLUCOSE 142* 104* 108*  BUN 20 20 21   CREATININE 2.06* 1.90* 2.01*  CALCIUM  8.2* 8.3* 8.2*  MG  --  2.1 1.9  PHOS  --   --  3.7    GFR: Estimated Creatinine Clearance: 34.6 mL/min (Abriella Filkins) (by C-G formula based on SCr of 2.01 mg/dL (H)).  Liver Function Tests: Recent Labs   Lab 09/03/23 0149 09/04/23 0321  AST 18 13*  ALT 18 14  ALKPHOS 107 71  BILITOT 1.1 1.3*  PROT 7.1 6.3*  ALBUMIN  2.3* 2.5*    CBG: Recent Labs  Lab 08/29/23 0753 08/29/23 1129 08/29/23 1645 08/29/23 2049 08/30/23 0733  GLUCAP 96 106* 116* 104* 91     Recent Results (from the past 240 hours)  Resp panel by RT-PCR (RSV, Flu Tyrone Pautsch&B, Covid) Anterior Nasal Swab     Status: None   Collection Time: 09/03/23  1:49 AM   Specimen: Anterior Nasal Swab  Result Value Ref Range Status   SARS Coronavirus 2 by RT PCR NEGATIVE NEGATIVE Final    Comment: (NOTE) SARS-CoV-2 target nucleic acids are NOT DETECTED.  The SARS-CoV-2 RNA is generally detectable in upper respiratory specimens during the acute phase of infection. The lowest concentration of SARS-CoV-2 viral copies this assay can detect is 138 copies/mL. Odes Lolli negative result does not preclude SARS-Cov-2 infection and should not be used as the sole basis for treatment or other patient management decisions. Artasia Thang negative result may occur with  improper specimen collection/handling, submission of specimen other than nasopharyngeal swab, presence of viral mutation(s) within the areas targeted by this assay, and inadequate number of viral copies(<138 copies/mL). Jesly Hartmann negative result must be combined with clinical observations, patient history, and epidemiological information. The expected result is Negative.  Fact Sheet for Patients:  BloggerCourse.com  Fact Sheet for Healthcare Providers:  SeriousBroker.it  This test is no t yet approved or cleared by the [place]  FDA and  has been authorized for detection and/or diagnosis of SARS-CoV-2 by FDA under an Emergency Use Authorization (EUA). This EUA will remain  in effect (meaning this test can be used) for the duration of the COVID-19 declaration under Section 564(b)(1) of the Act, 21 U.S.C.section 360bbb-3(b)(1), unless the  authorization is terminated  or revoked sooner.       Influenza Koehn Salehi by PCR NEGATIVE NEGATIVE Final   Influenza B by PCR NEGATIVE NEGATIVE Final    Comment: (NOTE) The Xpert Xpress SARS-CoV-2/FLU/RSV plus assay is intended as an aid in the diagnosis of influenza from Nasopharyngeal swab specimens and should not be used as Tyson Masin sole basis for treatment. Nasal washings and aspirates are unacceptable for Xpert Xpress SARS-CoV-2/FLU/RSV testing.  Fact Sheet for Patients: BloggerCourse.com  Fact Sheet for Healthcare Providers: SeriousBroker.it  This test is not yet approved or cleared by the [place]  FDA and has been authorized for detection and/or diagnosis of SARS-CoV-2 by FDA under an Emergency Use Authorization (EUA). This EUA will remain in effect (meaning this test can be used) for the duration of the COVID-19 declaration under Section 564(b)(1) of the Act, 21 U.S.C. section 360bbb-3(b)(1), unless the authorization is terminated or revoked.     Resp Syncytial Virus by PCR NEGATIVE NEGATIVE Final    Comment: (NOTE) Fact Sheet for Patients: BloggerCourse.com  Fact Sheet for Healthcare Providers: SeriousBroker.it  This test is not yet approved or cleared by the [place]  FDA and has been authorized for detection and/or diagnosis of SARS-CoV-2 by FDA under an Emergency Use Authorization (EUA). This EUA will remain in effect (meaning this test can be used) for the duration of the COVID-19 declaration under Section 564(b)(1) of the Act, 21 U.S.C. section 360bbb-3(b)(1), unless the authorization is terminated or revoked.  Performed at [facility], [address], [place], [place] [address]   Blood culture (routine x 2)     Status: None (Preliminary result)   Collection Time: 09/03/23  1:49 AM   Specimen: BLOOD  Result Value Ref Range Status   Specimen  Description   Final    BLOOD RIGHT ANTECUBITAL Performed at [facility], [address] [place], [place] [address]    Special Requests   Final    BOTTLES DRAWN AEROBIC AND ANAEROBIC Blood Culture adequate volume Performed at [facility], [address], [place], [place] [address]    Culture   Final    NO GROWTH <12 HOURS Performed at [facility], [address] [place], [place] [address]    Report Status PENDING  Incomplete  Blood culture (routine x 2)     Status: None (Preliminary result)   Collection Time: 09/03/23  3:00 AM   Specimen: BLOOD  Result Value Ref Range Status   Specimen Description   Final    BLOOD BLOOD RIGHT WRIST Performed at [facility], [address], [place], [place] [address]    Special Requests   Final    BOTTLES DRAWN AEROBIC AND ANAEROBIC Blood Culture adequate volume Performed at [facility], [address] [place], [place] [address]    Culture   Final    NO GROWTH <12 HOURS Performed at [facility], [address] [place], [place] [address]    Report Status PENDING  Incomplete  MRSA Next Gen by PCR, Nasal     Status: None   Collection Time: 09/03/23  4:37 AM   Specimen: Nasal Mucosa; Nasal Swab  Result Value Ref Range Status   MRSA by PCR Next Gen NOT DETECTED NOT DETECTED Final    Comment: (NOTE) The GeneXpert MRSA Assay (FDA approved for NASAL specimens only), is one component of Amoreena Neubert comprehensive MRSA colonization surveillance program. It is not intended to diagnose MRSA infection nor to guide or monitor treatment for MRSA infections. Test performance is not FDA approved in patients less than 11 years old. Performed at [facility], [address], [place], [place] [address]          Radiology Studies: VAS US  LOWER EXTREMITY VENOUS (DVT) Result Date: 09/03/2023  Lower Venous DVT Study Patient Name:  SOLACE MANWARREN [facility]  Date of  Exam:   09/03/2023 Medical  Rec #: [id number]      Accession #:    [id number] Date of Birth: May 17, 1960      Patient Gender: F Patient Age:   54 years Exam Location:  [facility] Procedure:      VAS US  LOWER EXTREMITY VENOUS (DVT) Referring Phys: Morayma Godown POWELL JR --------------------------------------------------------------------------------  Indications: Swelling.  Risk Factors: None identified. Limitations: Poor ultrasound/tissue interface. Comparison Study: No prior studies. Performing Technologist: Lerry Ransom RVT  Examination Guidelines: Nayib Remer complete evaluation includes B-mode imaging, spectral Doppler, color Doppler, and power Doppler as needed of all accessible portions of each vessel. Bilateral testing is considered an integral part of Bowie Doiron complete examination. Limited examinations for reoccurring indications may be performed as noted. The reflux portion of the exam is performed with the patient in reverse Trendelenburg.  +---------+---------------+---------+-----------+----------+--------------+ RIGHT    CompressibilityPhasicitySpontaneityPropertiesThrombus Aging +---------+---------------+---------+-----------+----------+--------------+ CFV      Full           Yes      Yes                                 +---------+---------------+---------+-----------+----------+--------------+ SFJ      Full                                                        +---------+---------------+---------+-----------+----------+--------------+ FV Prox  Full                                                        +---------+---------------+---------+-----------+----------+--------------+ FV Mid   Full                                                        +---------+---------------+---------+-----------+----------+--------------+ FV DistalFull                                                        +---------+---------------+---------+-----------+----------+--------------+ PFV      Full                                                         +---------+---------------+---------+-----------+----------+--------------+ POP      Full           Yes      Yes                                 +---------+---------------+---------+-----------+----------+--------------+ PTV      Full                                                        +---------+---------------+---------+-----------+----------+--------------+  PERO     Full                                                        +---------+---------------+---------+-----------+----------+--------------+   +---------+---------------+---------+-----------+----------+--------------+ LEFT     CompressibilityPhasicitySpontaneityPropertiesThrombus Aging +---------+---------------+---------+-----------+----------+--------------+ CFV      Full           Yes      Yes                                 +---------+---------------+---------+-----------+----------+--------------+ SFJ      Full                                                        +---------+---------------+---------+-----------+----------+--------------+ FV Prox  Full                                                        +---------+---------------+---------+-----------+----------+--------------+ FV Mid   Full                                                        +---------+---------------+---------+-----------+----------+--------------+ FV DistalFull                                                        +---------+---------------+---------+-----------+----------+--------------+ PFV      Full                                                        +---------+---------------+---------+-----------+----------+--------------+ POP      Full           Yes      Yes                                 +---------+---------------+---------+-----------+----------+--------------+ PTV      Full                                                         +---------+---------------+---------+-----------+----------+--------------+ PERO     Full                                                        +---------+---------------+---------+-----------+----------+--------------+  Summary: RIGHT: - There is no evidence of deep vein thrombosis in the lower extremity.  - No cystic structure found in the popliteal fossa.  LEFT: - There is no evidence of deep vein thrombosis in the lower extremity.  - No cystic structure found in the popliteal fossa.  *See table(s) above for measurements and observations. Electronically signed by Runell Countryman on 09/03/2023 at 6:18:34 PM.    Final    VAS US  UPPER EXTREMITY VENOUS DUPLEX Result Date: 09/03/2023 UPPER VENOUS STUDY  Patient Name:  RONNEISHA JETT [facility]  Date of Exam:   09/03/2023 Medical Rec #: [id number]      Accession #:    [id number] Date of Birth: 1960-10-23      Patient Gender: F Patient Age:   80 years Exam Location:  [facility] Procedure:      VAS US  UPPER EXTREMITY VENOUS DUPLEX Referring Phys: Corky Diener RATHORE --------------------------------------------------------------------------------  Indications: Swelling Risk Factors: None identified. Comparison Study: No prior studies. Performing Technologist: Lerry Ransom RVT  Examination Guidelines: Miken Stecher complete evaluation includes B-mode imaging, spectral Doppler, color Doppler, and power Doppler as needed of all accessible portions of each vessel. Bilateral testing is considered an integral part of Caswell Alvillar complete examination. Limited examinations for reoccurring indications may be performed as noted.  Right Findings: +----------+------------+---------+-----------+----------+-------+ RIGHT     CompressiblePhasicitySpontaneousPropertiesSummary +----------+------------+---------+-----------+----------+-------+ Subclavian    Full       Yes       Yes                      +----------+------------+---------+-----------+----------+-------+  Left  Findings: +----------+------------+---------+-----------+----------+-------+ LEFT      CompressiblePhasicitySpontaneousPropertiesSummary +----------+------------+---------+-----------+----------+-------+ IJV           Full       Yes       Yes                      +----------+------------+---------+-----------+----------+-------+ Subclavian               Yes       Yes                      +----------+------------+---------+-----------+----------+-------+ Axillary      Full       Yes       Yes                      +----------+------------+---------+-----------+----------+-------+ Brachial      Full                                          +----------+------------+---------+-----------+----------+-------+ Radial        Full                                          +----------+------------+---------+-----------+----------+-------+ Ulnar         Full                                          +----------+------------+---------+-----------+----------+-------+ Cephalic      None  Acute  +----------+------------+---------+-----------+----------+-------+ Basilic       Full                                          +----------+------------+---------+-----------+----------+-------+ Thrombus located in the cephalic vein is noted to extend from the mid upper arm into the distal forearm.  Summary:  Right: No evidence of thrombosis in the subclavian.  Left: No evidence of deep vein thrombosis in the upper extremity. Findings consistent with acute superficial vein thrombosis involving the left cephalic vein.  *See table(s) above for measurements and observations.  Diagnosing physician: Runell Countryman Electronically signed by Runell Countryman on 09/03/2023 at 6:18:23 PM.    Final    CT ABDOMEN PELVIS WO CONTRAST Result Date: 09/03/2023 CLINICAL DATA:  Abdominal pain, acute, nonlocalized. Leaking colostomy bag. Left-sided pain from the ribcage  to the hip. Prior colon surgery 2022. Lysis of adhesions 08/20/2023 with ventral hernia repair. EXAM: CT ABDOMEN AND PELVIS WITHOUT CONTRAST TECHNIQUE: Multidetector CT imaging of the abdomen and pelvis was performed following the standard protocol without IV contrast. RADIATION DOSE REDUCTION: This exam was performed according to the departmental dose-optimization program which includes automated exposure control, adjustment of the mA and/or kV according to patient size and/or use of iterative reconstruction technique. COMPARISON:  The preoperative CTs without contrast 08/12/2023 and 08/06/2023. FINDINGS: Lower chest: Mild cardiomegaly. Healed median sternotomy. No pericardial effusion. There is Ryleeann Urquiza partially visible ascending aortic sleeve repair, median sternotomy sutures, partially visible thoracic aortic stent graft repair, maximum descending aortic caliber 4.6 cm. Chronic elevation right hemidiaphragm. There is increased posterior atelectasis in the lower lobes, increased subsegmental atelectasis in the posterior right middle lobe. There are increased ground-glass opacities in the right middle and lower lobes which could be ground-glass edema or pneumonitis. Hepatobiliary: Limited detail in the liver owing to streak artifacts from the patient's overlying arms. No obvious mass. There are tiny layering stones in the gallbladder without bile duct dilatation or wall thickening. Pancreas: Partially atrophic. Otherwise unremarkable without contrast. Spleen: Unremarkable without contrast. Adrenals/Urinary Tract: No adrenal mass. No contour deforming abnormality of either of the unenhanced kidneys. Chronic perinephric stranding is similar. No urinary stone or obstruction. There is no bladder thickening. There is trace air in the anterior bladder which could be due to recent catheterization or infection. This not seen on the earlier 2 studies from this month. Stomach/Bowel: As seen previously there has been Kaylia Winborne remote  right hemicolectomy up to the distal transverse colon. The unused portion of the colon is contracted and empty. The gastric wall is contracted. Majority of the small bowel is normal in caliber with slightly dilated segments in the anterior left mid abdomen up to 2.7 cm caliber, favor ileus but could be due to Anorah Trias low-grade obstruction. The small bowel was diffusely much more dilated previously. There is Arlee Bossard right mid abdominal loop ileostomy. There previously was Madgie Dhaliwal sizable parastomal hernia which has been reduced, with herniated bowel segments no longer seen. Vascular/Lymphatic: There is moderate to heavy aortoiliac calcific plaque. 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 No lymphadenopathy is seen.  Juxtarenal AAA again measures 4 cm. Reproductive: Uterus and bilateral adnexa are unremarkable. Other: Loculated fluid collection just deep to the anterolateral right mid abdominal wall measures 4.4 x 2.8 cm on 2:50, 20 Hounsfield units and could be Migel Hannis loculated seroma or hematoma, with infection not excluded. There is mild patchy fluid  in the mesenteric folds, and scattered nonlocalizing fluid in Minetta Krisher circumscribed area in the anterior right lower abdominopelvic quadrant measuring 16.3 x 6.3 cm on 2:68, displacing bowel around it and containing fat and scattered fluid. I believe this is probably fat which was previously herniated and has been placed back into the abdominal cavity. This may be undergoing Adna Nofziger degree of fat necrosis. There previously was Londyn Hotard large complex right paraumbilical hernia just above this level which has been repaired. There are overlying midline skin staples. There is an ovoid subcutaneous fluid collection anteriorly along side the umbilicus in the right mid abdomen measuring 10.4 x 2.5 cm and 18 Hounsfield units on 2:59 which is probably Xia Stohr seroma. There is Lou Irigoyen hematoma underlying the incision subcutaneously measuring 4.7 x 2.7 cm on 2:63, and another subcutaneous hematoma in the anterior right mid  abdomen below this level measuring 3.9 x 3.0 cm on 2:77, as well as body wall anasarca in the lateral abdominal walls and flanks and in the upper thighs. There is no free hemorrhage seen, no evidence of free air. Multiple pelvic phleboliths. Musculoskeletal: There are degenerative changes in the lumbar spine greatest at L5-S1, ankylosis over the anterior right SI joint. No acute or other significant osseous findings. IMPRESSION: 1. 4.4 x 2.8 cm loculated fluid collection just deep to the anterolateral right mid abdominal wall, could be Arvle Grabe loculated seroma or hematoma, with infection not excluded. 2. 16.3 x 6.3 cm circumscribed area in the anterior right lower abdominopelvic quadrant containing fat and scattered fluid, probably fat which was previously herniated and has been placed back into the abdominal cavity. This may be undergoing Tahje Borawski degree of fat necrosis. 3. 10.4 x 2.5 cm subcutaneous fluid collection anteriorly along side the umbilicus in the right mid abdomen, probably Zymarion Favorite seroma. 4. 4.7 x 2.7 cm hematoma underlying the incision subcutaneously, and another 3.9 x 3.0 cm hematoma in the anterior right mid abdomen below this level. 5. Parastomal and right paraumbilical hernia repairs as detailed above. 6. Body wall anasarca. 7. Slightly dilated small bowel segments in the anterior left mid abdomen up to 2.7 cm caliber, favor ileus but could be due to Aluna Whiston low-grade obstruction. The small bowel was diffusely much more dilated previously. 8. Cholelithiasis. 9. Increased ground-glass opacities in the right middle and lower lobes which could be ground-glass edema or pneumonitis. 10. Cardiomegaly with partially visible ascending aortic sleeve repair, median sternotomy sutures, and partially visible thoracic aortic stent graft repair, maximum descending aortic caliber 4.6 cm. 11. Aortic atherosclerosis and stable AAA. Recommend CTA or MRA, as appropriate, in 6 months and referral to Zeeva Courser vascular specialist if not already  done. Reference: Journal of Vascular Surgery 67.1 (2018): 2-77. J Am Coll Radiol [phone]. Aortic Atherosclerosis (ICD10-I70.0). Electronically Signed   By: Denman Fischer M.D.   On: 09/03/2023 03:01   DG Chest Portable 1 View Result Date: 09/03/2023 CLINICAL DATA:  Shortness of breath. EXAM: PORTABLE CHEST 1 VIEW COMPARISON:  Aug 20, 2023 FINDINGS: Multiple sternal wires are present. The heart size and mediastinal contours are within normal limits. There is extensive stenting of the thoracic aorta. Low lung volumes are noted. Mild atelectasis and/or infiltrate is seen within the bilateral lung bases and mid right lung. No pleural effusion or pneumothorax is identified. Multilevel degenerative changes seen throughout the thoracic spine. IMPRESSION: Low lung volumes with mild bibasilar and mid right lung atelectasis and/or infiltrate. Electronically Signed   By: Virgle Grime M.D.   On: 09/03/2023 02:31  Scheduled Meds:  atorvastatin   80 mg Oral Daily   Chlorhexidine  Gluconate Cloth  6 each Topical Daily   furosemide   60 mg Intravenous BID   metoprolol  tartrate  50 mg Oral BID   pantoprazole   40 mg Oral Daily   Continuous Infusions:  albumin  human Stopped (09/03/23 1606)   ceFEPime  (MAXIPIME ) IV Stopped (09/04/23 0314)   metronidazole  Stopped (09/04/23 0422)   vancomycin        LOS: 1 day    Time spent: over 30 min    Donnetta Gains, MD [facility]   To contact the attending provider between 7A-7P or the covering provider during after hours 7P-7A, please log into the web site [email or web address] and access using universal [place] password for that web site. If you do not have the password, please call the hospital operator.  09/04/2023, 8:14 AM

## 2023-09-04 NOTE — Evaluation (Signed)
 Physical Therapy Evaluation Patient Details Name: Dorothy Harris MRN: [id number] DOB: July 06, 1960 Today's Date: 09/04/2023  History of Present Illness  Dorothy Harris is a 63 y.o. 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  Recently admitted to [facility] from 5/1-5/24 for high-grade SBO status post ex lap with lysis of adhesions and resection of small bowel with end ileostomy revision on 5/15.  Also treated for AKI on CKD stage IV due to volume depletion/dehydration treated with IV fluids and acute anemia treated with a total of 3 units PRBCs.  Started on oral metoprolol  for A-fib and Eliquis was resumed on discharge and was discharged to SNF. ED presentation with leaking iliostomy bag. Re-admitted to [facility] with dx CHF, ARF, pulmonary edema.  Clinical Impression  Patient readmitted from short term snf level rehab but prior to 08/06/23 patient was mod I in home with quad cane for ambulation and driving. Currently, patient presents with deficits outlined below most significantly pain, edema L UE, generalized muscle weakness LE> UE's, decreased coordination, activity tolerance, and balance impacting BADL's and functional mobility. Patient will benefit from continued inpatient follow up therapy, <3 hours/day to maximize IND and safety prior to return home.       If plan is discharge home, recommend the following: A lot of help with bathing/dressing/bathroom;A lot of help with walking and/or transfers;Help with stairs or ramp for entrance;Assistance with cooking/housework   Can travel by private vehicle   No    Equipment Recommendations None recommended by PT  Recommendations for Other Services       Functional Status Assessment Patient has had a recent decline in their functional status and demonstrates  the ability to make significant improvements in function in a reasonable and predictable amount of time.     Precautions / Restrictions Precautions Precautions: Fall Recall of Precautions/Restrictions: Intact Precaution/Restrictions Comments: BP effective on  L LE, R LE weakness, ostomy Restrictions Weight Bearing Restrictions Per Provider Order: No      Mobility  Bed Mobility Overal bed mobility: Needs Assistance Bed Mobility: Supine to Sit, Sit to Supine     Supine to sit: +2 for physical assistance, +2 for safety/equipment, HOB elevated, Used rails, Min assist, Mod assist Sit to supine: Mod assist, Max assist, +2 for physical assistance, +2 for safety/equipment, HOB elevated, Used rails   General bed mobility comments: decreased LE management due to weakness    Transfers Overall transfer level: Needs assistance Equipment used: Rolling walker (2 wheels) Transfers: Sit to/from Stand Sit to Stand: +2 physical assistance, +2 safety/equipment, Mod assist, Max assist, From elevated surface           General transfer comment: R LE weakness and generalized fatigue limited transfer OOB this session, rec STEDY next visit to trial    Ambulation/Gait               General Gait Details: Pt stood only - unable to initiate step  Stairs            Wheelchair Mobility     Tilt Bed    Modified Rankin (Stroke Patients Only)       Balance Overall balance assessment: Needs assistance Sitting-balance support: Feet supported, No upper extremity supported Sitting balance-Leahy Scale: Fair     Standing balance support: Bilateral upper extremity supported, Reliant on assistive device for balance Standing balance-Leahy  Scale: Poor Standing balance comment: concern for R LE buckling thus supported with blocking                             Pertinent Vitals/Pain Pain Assessment Pain Assessment: Faces Faces Pain Scale: Hurts little more Pain Location: R  LE Pain Descriptors / Indicators: Discomfort Pain Intervention(s): Limited activity within patient's tolerance, Monitored during session    Home Living Family/patient expects to be discharged to:: Skilled nursing facility Living Arrangements: Alone Available Help at Discharge: Family;Available 24 hours/day Type of Home: House Home Access: Stairs to enter Entrance Stairs-Rails: Left Entrance Stairs-Number of Steps: 1   Home Layout: One level Home Equipment: Agricultural consultant (2 wheels);Cane - quad;Shower seat      Prior Function Prior Level of Function : Independent/Modified Independent;Driving             Mobility Comments: Community ambulation using Quad-cane, drives ADLs Comments: Independent     Extremity/Trunk Assessment   Upper Extremity Assessment Upper Extremity Assessment: Right hand dominant;Defer to OT evaluation LUE Deficits / Details: +2 edema L UE, decreased strength 3-/5 grossly LUE Coordination: decreased fine motor    Lower Extremity Assessment Lower Extremity Assessment: Generalized weakness (generally 3-/5 with increased discomfort R LE)    Cervical / Trunk Assessment Cervical / Trunk Assessment: Normal  Communication   Communication Communication: No apparent difficulties    Cognition Arousal: Alert Behavior During Therapy: WFL for tasks assessed/performed                             Following commands: Intact       Cueing Cueing Techniques: Verbal cues     General Comments General comments (skin integrity, edema, etc.): ostomy, SpO2 96% on 2 ltrs via [place]    Exercises     Assessment/Plan    PT Assessment Patient needs continued PT services  PT Problem List Decreased strength;Decreased activity tolerance;Decreased balance;Decreased mobility;Decreased knowledge of use of DME;Obesity;Pain       PT Treatment Interventions DME instruction;Gait training;Stair training;Functional mobility training;Therapeutic  activities;Therapeutic exercise;Balance training;Patient/family education    PT Goals (Current goals can be found in the Care Plan section)  Acute Rehab PT Goals Patient Stated Goal: Regain IND PT Goal Formulation: With patient Time For Goal Achievement: 09/17/23 Potential to Achieve Goals: Good    Frequency Min 3X/week     Co-evaluation PT/OT/SLP Co-Evaluation/Treatment: Yes Reason for Co-Treatment: Complexity of the patient's impairments (multi-system involvement);For patient/therapist safety PT goals addressed during session: Mobility/safety with mobility;Balance;Proper use of DME OT goals addressed during session: ADL's and self-care;Proper use of Adaptive equipment and DME       AM-PAC PT "6 Clicks" Mobility  Outcome Measure Help needed turning from your back to your side while in a flat bed without using bedrails?: A Little Help needed moving from lying on your back to sitting on the side of a flat bed without using bedrails?: A Lot Help needed moving to and from a bed to a chair (including a wheelchair)?: A Lot Help needed standing up from a chair using your arms (e.g., wheelchair or bedside chair)?: A Lot Help needed to walk in hospital room?: Total Help needed climbing 3-5 steps with a railing? : Total 6 Click Score: 11    End of Session Equipment Utilized During Treatment: Gait belt;Oxygen Activity Tolerance: Patient tolerated treatment well;Patient limited by fatigue Patient left: in bed;with call bell/phone  within reach;with bed alarm set Nurse Communication: Mobility status PT Visit Diagnosis: Unsteadiness on feet (R26.81);Other abnormalities of gait and mobility (R26.89);Muscle weakness (generalized) (M62.81);Pain Pain - Right/Left: Right Pain - part of body: Leg    Time: 1020-1047 PT Time Calculation (min) (ACUTE ONLY): 27 min   Charges:   PT Evaluation $PT Eval Moderate Complexity: 1 Mod   PT General Charges $$ ACUTE PT VISIT: 1 Visit          Thedora Finlay PT Acute Rehabilitation Services Pager [phone] Office [phone]   Sanela Evola 09/04/2023, 1:10 PM

## 2023-09-04 NOTE — Progress Notes (Signed)
 PHARMACY - ANTICOAGULATION CONSULT NOTE  Pharmacy Consult for heparin  Indication: atrial fibrillation  Allergies  Allergen Reactions   Chlorthalidone  Other (See Comments)    ELEVATED KIDNEY FUNCTION     Patient Measurements: Height: 5\' 7"  (170.2 cm) Weight: 96.4 kg (212 lb 8.4 oz) IBW/kg (Calculated) : 61.6 HEPARIN  DW (KG): 84.8  Vital Signs: Temp: 97.9 F (36.6 C) (05/30 0800) Temp Source: Oral (05/30 0800) BP: 99/81 (05/30 0600) Pulse Rate: 107 (05/30 0600)  Labs: Recent Labs    09/03/23 0149 09/03/23 0806 09/04/23 0321  HGB 9.1* 8.4* 7.8*  HCT 30.8* 29.0* 26.8*  PLT 373 361 306  CREATININE 2.06* 1.90* 2.01*  TROPONINIHS 27* 29*  --     Estimated Creatinine Clearance: 34.6 mL/min (A) (by C-G formula based on SCr of 2.01 mg/dL (H)).   Medical History: 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HCC)    Anemia    Chronic kidney disease    Essential hypertension 05/07/2021   GERD (gastroesophageal reflux disease)    Hernia, epigastric    Hypertension    PAF (paroxysmal atrial fibrillation) (HCC) 05/07/2021   Pure hypercholesterolemia 05/07/2021    Medications:  Medications Prior to Admission  Medication Sig Dispense Refill Last Dose/Taking   acetaminophen  (TYLENOL ) 500 MG tablet Take 2 tablets (1,000 mg total) by mouth every 6 (six) hours.   09/02/2023 at  3:37 PM   Amino Acids -Protein Hydrolys (FEEDING SUPPLEMENT, PRO-STAT 64,) LIQD Take 30 mLs by mouth daily at 6 (six) AM.   09/02/2023 at  9:00 AM   amiodarone  (PACERONE ) 200 MG tablet 1 po BID thru 09/17/23 then 1 po daily   09/02/2023 at  8:00 PM   apixaban  (ELIQUIS ) 5 MG TABS tablet Take 1 tablet (5 mg total) by mouth 2 (two) times daily.   09/02/2023 at  8:00 PM   atorvastatin  (LIPITOR ) 80 MG tablet Take 1 tablet (80 mg total) by mouth daily. 90 tablet 3 09/02/2023 at  6:00 PM   ferrous sulfate  325 (65 FE) MG tablet Take 1 tablet (325 mg total) by mouth daily with breakfast. 30 tablet 0  09/02/2023 Evening   melatonin 3 MG TABS tablet Take 2 tablets (6 mg total) by mouth at bedtime as needed.   Unknown   metoprolol  tartrate (LOPRESSOR ) 50 MG tablet Take 1 tablet (50 mg total) by mouth 2 (two) times daily.   09/02/2023 at  8:00 PM   Multiple Vitamin (MULTIVITAMIN WITH MINERALS) TABS tablet Take 1 tablet by mouth daily.   09/02/2023 at  8:00 AM   [EXPIRED] oxyCODONE  (OXY IR/ROXICODONE ) 5 MG immediate release tablet Take 1-2 tablets (5-10 mg total) by mouth every 6 (six) hours as needed for up to 5 days for moderate pain (pain score 4-6) or severe pain (pain score 7-10). 30 tablet 0 08/31/2023   pantoprazole  (PROTONIX ) 40 MG tablet Take 1 tablet (40 mg total) by mouth daily.   09/02/2023 at  6:30 AM   polyethylene glycol (MIRALAX ) 17 g packet Take 17 g by mouth daily as needed for mild constipation.   Unknown   Scheduled:   atorvastatin   80 mg Oral Daily   Chlorhexidine  Gluconate Cloth  6 each Topical Daily   furosemide   60 mg Intravenous BID   metoprolol  tartrate  50 mg Oral BID   pantoprazole   40 mg Oral Daily    Assessment: 63 YO female presenting with shortness of breath and leaking ileostomy bag, sepsis suspected. Patient was recently admitted to [facility] (  5/1-5/24) for ostomy placement secondary to SBO. CT abdomen shows intact midline wound without cellulitis or drainage--surgery suspects seroma/hematoma and not infectious. Patient has a history of afib on Eliquis  PTA (last dose 5/28 PM)--has been held this admission, per surgery. Per surgery, no surgical intervention at this time, but recommending initiation of heparin  in the meantime in case surgery is needed in the coming days.   Today, 09/04/23: Hgb 7.8 (BL upper 7s-8s)--low, stable Plts 306--stable Scr 2.01 (BL 2-2.5)--elevated, stable Although LD Eliquis  5/28 PM, given poor renal function at baseline (may not be eliminating Eliquis  as quickly) and likely hematomas, will not bolus heparin  at this time. No s/sx of bleeding  reported other than possible hematomas of abdominal wall.  Goal of Therapy:  Heparin  level 0.3-0.7 units/ml aPTT 66-102 seconds Monitor platelets by anticoagulation protocol: Yes   Plan:  Start heparin  infusion at 1250 units/hr Check anti-Xa level and aPTT in 8 hours and daily while on heparin  Continue to monitor H&H and platelets   Roselee Cong, PharmD Clinical Pharmacist  5/30/20258:42 AM

## 2023-09-04 NOTE — Progress Notes (Signed)
 Assessment & Plan: POD#15 - s/p ex lap, SBR, LOA, ileostomy revision, VHR at [facility] - 08/20/23 Dr. Collene Dawson - continue IV Cefepime , Vanc - WBC improved - soft diet - monitor midline wound - change dressing BID  FEN - soft diet VTE - SCD's, recommend hep gtt instead of DOAC at this time for afib ID - cefepime , vanc Admit - TRH service    ?possible pneumonia/pneumonitis  CHFpEF HLD Hx aortic dissection Obesity CKD IV afib        Dorothy Billow, MD [facility] practice Office: [phone]        Chief Complaint: Post op fluid collections, fat necrosis  Subjective: Patient in bed, comfortable.  Mild left sided abdominal pain.  Tolerating soft diet.  Objective: Vital signs in last 24 hours: Temp:  [97.9 F (36.6 C)-98.8 F (37.1 C)] 97.9 F (36.6 C) (05/30 0800) Pulse Rate:  [85-115] 107 (05/30 0600) Resp:  [16-31] 20 (05/30 0600) BP: (99-140)/(80-110) 99/81 (05/30 0600) SpO2:  [96 %-100 %] 98 % (05/30 0600) Weight:  [96.4 kg] 96.4 kg (05/30 0500) Last BM Date : 09/03/23  Intake/Output from previous day: 05/29 0701 - 05/30 0700 In: 834.7 [P.O.:250; IV Piggyback:584.7] Out: 4055 [Urine:3280; Stool:775] Intake/Output this shift: No intake/output data recorded.  Physical Exam: HEENT - sclerae clear, mucous membranes moist Abdomen - soft, obese; midline wound with small dark drainage from superior incision; ileostomy viable; minimal tenderness  Lab Results:  Recent Labs    09/03/23 0806 09/04/23 0321  WBC 15.1* 12.6*  HGB 8.4* 7.8*  HCT 29.0* 26.8*  PLT 361 306   BMET Recent Labs    09/03/23 0806 09/04/23 0321  NA 140 140  K 4.3 4.3  CL 107 106  CO2 23 25  GLUCOSE 104* 108*  BUN 20 21  CREATININE 1.90* 2.01*  CALCIUM  8.3* 8.2*   PT/INR No results for input(s): "LABPROT", "INR" in the last 72 hours. Comprehensive Metabolic Panel:    Component Value Date/Time   NA 140 09/04/2023 0321   NA 140 09/03/2023 0806   NA 140  05/05/2023 0830   NA 142 05/29/2022 0856   K 4.3 09/04/2023 0321   K 4.3 09/03/2023 0806   CL 106 09/04/2023 0321   CL 107 09/03/2023 0806   CO2 25 09/04/2023 0321   CO2 23 09/03/2023 0806   BUN 21 09/04/2023 0321   BUN 20 09/03/2023 0806   BUN 31 (H) 05/05/2023 0830   BUN 40 (H) 05/29/2022 0856   CREATININE 2.01 (H) 09/04/2023 0321   CREATININE 1.90 (H) 09/03/2023 0806   CREATININE 1.69 (H) 08/21/2017 0935   CREATININE 2.39 (H) 07/30/2017 1204   GLUCOSE 108 (H) 09/04/2023 0321   GLUCOSE 104 (H) 09/03/2023 0806   CALCIUM  8.2 (L) 09/04/2023 0321   CALCIUM  8.3 (L) 09/03/2023 0806   AST 13 (L) 09/04/2023 0321   AST 18 09/03/2023 0149   ALT 14 09/04/2023 0321   ALT 18 09/03/2023 0149   ALKPHOS 71 09/04/2023 0321   ALKPHOS 107 09/03/2023 0149   BILITOT 1.3 (H) 09/04/2023 0321   BILITOT 1.1 09/03/2023 0149   BILITOT 0.4 05/05/2023 0830   BILITOT 0.4 05/08/2022 1012   PROT 6.3 (L) 09/04/2023 0321   PROT 7.1 09/03/2023 0149   PROT 7.0 05/05/2023 0830   PROT 7.1 05/08/2022 1012   ALBUMIN  2.5 (L) 09/04/2023 0321   ALBUMIN  2.3 (L) 09/03/2023 0149   ALBUMIN  4.3 05/05/2023 0830   ALBUMIN  4.3 05/08/2022 1012  Studies/Results: VAS US  LOWER EXTREMITY VENOUS (DVT) Result Date: 09/03/2023  Lower Venous DVT Study Patient Name:  ADLINE KIRSHENBAUM [facility]  Date of Exam:   09/03/2023 Medical Rec #: [id number]      Accession #:    [id number] Date of Birth: 1960-12-12      Patient Gender: F Patient Age:   63 years Exam Location:  [facility] Procedure:      VAS US  LOWER EXTREMITY VENOUS (DVT) Referring Phys: A POWELL JR --------------------------------------------------------------------------------  Indications: Swelling.  Risk Factors: None identified. Limitations: Poor ultrasound/tissue interface. Comparison Study: No prior studies. Performing Technologist: Lerry Ransom RVT  Examination Guidelines: A complete evaluation includes B-mode imaging, spectral Doppler, color Doppler, and power Doppler  as needed of all accessible portions of each vessel. Bilateral testing is considered an integral part of a complete examination. Limited examinations for reoccurring indications may be performed as noted. The reflux portion of the exam is performed with the patient in reverse Trendelenburg.  +---------+---------------+---------+-----------+----------+--------------+ RIGHT    CompressibilityPhasicitySpontaneityPropertiesThrombus Aging +---------+---------------+---------+-----------+----------+--------------+ CFV      Full           Yes      Yes                                 +---------+---------------+---------+-----------+----------+--------------+ SFJ      Full                                                        +---------+---------------+---------+-----------+----------+--------------+ FV Prox  Full                                                        +---------+---------------+---------+-----------+----------+--------------+ FV Mid   Full                                                        +---------+---------------+---------+-----------+----------+--------------+ FV DistalFull                                                        +---------+---------------+---------+-----------+----------+--------------+ PFV      Full                                                        +---------+---------------+---------+-----------+----------+--------------+ POP      Full           Yes      Yes                                 +---------+---------------+---------+-----------+----------+--------------+ PTV  Full                                                        +---------+---------------+---------+-----------+----------+--------------+ PERO     Full                                                        +---------+---------------+---------+-----------+----------+--------------+    +---------+---------------+---------+-----------+----------+--------------+ LEFT     CompressibilityPhasicitySpontaneityPropertiesThrombus Aging +---------+---------------+---------+-----------+----------+--------------+ CFV      Full           Yes      Yes                                 +---------+---------------+---------+-----------+----------+--------------+ SFJ      Full                                                        +---------+---------------+---------+-----------+----------+--------------+ FV Prox  Full                                                        +---------+---------------+---------+-----------+----------+--------------+ FV Mid   Full                                                        +---------+---------------+---------+-----------+----------+--------------+ FV DistalFull                                                        +---------+---------------+---------+-----------+----------+--------------+ PFV      Full                                                        +---------+---------------+---------+-----------+----------+--------------+ POP      Full           Yes      Yes                                 +---------+---------------+---------+-----------+----------+--------------+ PTV      Full                                                        +---------+---------------+---------+-----------+----------+--------------+  PERO     Full                                                        +---------+---------------+---------+-----------+----------+--------------+     Summary: RIGHT: - There is no evidence of deep vein thrombosis in the lower extremity.  - No cystic structure found in the popliteal fossa.  LEFT: - There is no evidence of deep vein thrombosis in the lower extremity.  - No cystic structure found in the popliteal fossa.  *See table(s) above for measurements and observations. Electronically signed  by Runell Countryman on 09/03/2023 at 6:18:34 PM.    Final    VAS US  UPPER EXTREMITY VENOUS DUPLEX Result Date: 09/03/2023 UPPER VENOUS STUDY  Patient Name:  FARYAL MARXEN [facility]  Date of Exam:   09/03/2023 Medical Rec #: [id number]      Accession #:    [id number] Date of Birth: Sep 19, 1960      Patient Gender: F Patient Age:   33 years Exam Location:  [facility] Procedure:      VAS US  UPPER EXTREMITY VENOUS DUPLEX Referring Phys: Corky Diener RATHORE --------------------------------------------------------------------------------  Indications: Swelling Risk Factors: None identified. Comparison Study: No prior studies. Performing Technologist: Lerry Ransom RVT  Examination Guidelines: A complete evaluation includes B-mode imaging, spectral Doppler, color Doppler, and power Doppler as needed of all accessible portions of each vessel. Bilateral testing is considered an integral part of a complete examination. Limited examinations for reoccurring indications may be performed as noted.  Right Findings: +----------+------------+---------+-----------+----------+-------+ RIGHT     CompressiblePhasicitySpontaneousPropertiesSummary +----------+------------+---------+-----------+----------+-------+ Subclavian    Full       Yes       Yes                      +----------+------------+---------+-----------+----------+-------+  Left Findings: +----------+------------+---------+-----------+----------+-------+ LEFT      CompressiblePhasicitySpontaneousPropertiesSummary +----------+------------+---------+-----------+----------+-------+ IJV           Full       Yes       Yes                      +----------+------------+---------+-----------+----------+-------+ Subclavian               Yes       Yes                      +----------+------------+---------+-----------+----------+-------+ Axillary      Full       Yes       Yes                       +----------+------------+---------+-----------+----------+-------+ Brachial      Full                                          +----------+------------+---------+-----------+----------+-------+ Radial        Full                                          +----------+------------+---------+-----------+----------+-------+ Ulnar         Full                                          +----------+------------+---------+-----------+----------+-------+  Cephalic      None                                   Acute  +----------+------------+---------+-----------+----------+-------+ Basilic       Full                                          +----------+------------+---------+-----------+----------+-------+ Thrombus located in the cephalic vein is noted to extend from the mid upper arm into the distal forearm.  Summary:  Right: No evidence of thrombosis in the subclavian.  Left: No evidence of deep vein thrombosis in the upper extremity. Findings consistent with acute superficial vein thrombosis involving the left cephalic vein.  *See table(s) above for measurements and observations.  Diagnosing physician: Runell Countryman Electronically signed by Runell Countryman on 09/03/2023 at 6:18:23 PM.    Final    CT ABDOMEN PELVIS WO CONTRAST Result Date: 09/03/2023 CLINICAL DATA:  Abdominal pain, acute, nonlocalized. Leaking colostomy bag. Left-sided pain from the ribcage to the hip. Prior colon surgery 2022. Lysis of adhesions 08/20/2023 with ventral hernia repair. EXAM: CT ABDOMEN AND PELVIS WITHOUT CONTRAST TECHNIQUE: Multidetector CT imaging of the abdomen and pelvis was performed following the standard protocol without IV contrast. RADIATION DOSE REDUCTION: This exam was performed according to the departmental dose-optimization program which includes automated exposure control, adjustment of the mA and/or kV according to patient size and/or use of iterative reconstruction technique. COMPARISON:  The  preoperative CTs without contrast 08/12/2023 and 08/06/2023. FINDINGS: Lower chest: Mild cardiomegaly. Healed median sternotomy. No pericardial effusion. There is a partially visible ascending aortic sleeve repair, median sternotomy sutures, partially visible thoracic aortic stent graft repair, maximum descending aortic caliber 4.6 cm. Chronic elevation right hemidiaphragm. There is increased posterior atelectasis in the lower lobes, increased subsegmental atelectasis in the posterior right middle lobe. There are increased ground-glass opacities in the right middle and lower lobes which could be ground-glass edema or pneumonitis. Hepatobiliary: Limited detail in the liver owing to streak artifacts from the patient's overlying arms. No obvious mass. There are tiny layering stones in the gallbladder without bile duct dilatation or wall thickening. Pancreas: Partially atrophic. Otherwise unremarkable without contrast. Spleen: Unremarkable without contrast. Adrenals/Urinary Tract: No adrenal mass. No contour deforming abnormality of either of the unenhanced kidneys. Chronic perinephric stranding is similar. No urinary stone or obstruction. There is no bladder thickening. There is trace air in the anterior bladder which could be due to recent catheterization or infection. This not seen on the earlier 2 studies from this month. Stomach/Bowel: As seen previously there has been a remote right hemicolectomy up to the distal transverse colon. The unused portion of the colon is contracted and empty. The gastric wall is contracted. Majority of the small bowel is normal in caliber with slightly dilated segments in the anterior left mid abdomen up to 2.7 cm caliber, favor ileus but could be due to a low-grade obstruction. The small bowel was diffusely much more dilated previously. There is a right mid abdominal loop ileostomy. There previously was a sizable parastomal hernia which has been reduced, with herniated bowel segments  no longer seen. Vascular/Lymphatic: There is moderate to heavy aortoiliac calcific plaque. 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 No lymphadenopathy is seen.  Juxtarenal AAA again measures 4 cm. Reproductive: Uterus and bilateral adnexa are  unremarkable. Other: Loculated fluid collection just deep to the anterolateral right mid abdominal wall measures 4.4 x 2.8 cm on 2:50, 20 Hounsfield units and could be a loculated seroma or hematoma, with infection not excluded. There is mild patchy fluid in the mesenteric folds, and scattered nonlocalizing fluid in a circumscribed area in the anterior right lower abdominopelvic quadrant measuring 16.3 x 6.3 cm on 2:68, displacing bowel around it and containing fat and scattered fluid. I believe this is probably fat which was previously herniated and has been placed back into the abdominal cavity. This may be undergoing a degree of fat necrosis. There previously was a large complex right paraumbilical hernia just above this level which has been repaired. There are overlying midline skin staples. There is an ovoid subcutaneous fluid collection anteriorly along side the umbilicus in the right mid abdomen measuring 10.4 x 2.5 cm and 18 Hounsfield units on 2:59 which is probably a seroma. There is a hematoma underlying the incision subcutaneously measuring 4.7 x 2.7 cm on 2:63, and another subcutaneous hematoma in the anterior right mid abdomen below this level measuring 3.9 x 3.0 cm on 2:77, as well as body wall anasarca in the lateral abdominal walls and flanks and in the upper thighs. There is no free hemorrhage seen, no evidence of free air. Multiple pelvic phleboliths. Musculoskeletal: There are degenerative changes in the lumbar spine greatest at L5-S1, ankylosis over the anterior right SI joint. No acute or other significant osseous findings. IMPRESSION: 1. 4.4 x 2.8 cm loculated fluid collection just deep to the anterolateral right mid abdominal wall, could be a  loculated seroma or hematoma, with infection not excluded. 2. 16.3 x 6.3 cm circumscribed area in the anterior right lower abdominopelvic quadrant containing fat and scattered fluid, probably fat which was previously herniated and has been placed back into the abdominal cavity. This may be undergoing a degree of fat necrosis. 3. 10.4 x 2.5 cm subcutaneous fluid collection anteriorly along side the umbilicus in the right mid abdomen, probably a seroma. 4. 4.7 x 2.7 cm hematoma underlying the incision subcutaneously, and another 3.9 x 3.0 cm hematoma in the anterior right mid abdomen below this level. 5. Parastomal and right paraumbilical hernia repairs as detailed above. 6. Body wall anasarca. 7. Slightly dilated small bowel segments in the anterior left mid abdomen up to 2.7 cm caliber, favor ileus but could be due to a low-grade obstruction. The small bowel was diffusely much more dilated previously. 8. Cholelithiasis. 9. Increased ground-glass opacities in the right middle and lower lobes which could be ground-glass edema or pneumonitis. 10. Cardiomegaly with partially visible ascending aortic sleeve repair, median sternotomy sutures, and partially visible thoracic aortic stent graft repair, maximum descending aortic caliber 4.6 cm. 11. Aortic atherosclerosis and stable AAA. Recommend CTA or MRA, as appropriate, in 6 months and referral to a vascular specialist if not already done. Reference: Journal of Vascular Surgery 67.1 (2018): 2-77. J Am Coll Radiol [phone]. Aortic Atherosclerosis (ICD10-I70.0). Electronically Signed   By: Denman Fischer M.D.   On: 09/03/2023 03:01   DG Chest Portable 1 View Result Date: 09/03/2023 CLINICAL DATA:  Shortness of breath. EXAM: PORTABLE CHEST 1 VIEW COMPARISON:  Aug 20, 2023 FINDINGS: Multiple sternal wires are present. The heart size and mediastinal contours are within normal limits. There is extensive stenting of the thoracic aorta. Low lung volumes are noted. Mild  atelectasis and/or infiltrate is seen within the bilateral lung bases and mid right lung. No pleural effusion or  pneumothorax is identified. Multilevel degenerative changes seen throughout the thoracic spine. IMPRESSION: Low lung volumes with mild bibasilar and mid right lung atelectasis and/or infiltrate. Electronically Signed   By: Virgle Grime M.D.   On: 09/03/2023 02:31      Dorothy Harris 09/04/2023  Patient ID: Shauna Del, female   DOB: September 08, 1960, 63 y.o.   MRN: [id number]

## 2023-09-04 NOTE — Progress Notes (Signed)
 Per pt request, sister [name] called with update that pt is out of ICU and doing well.

## 2023-09-04 NOTE — Progress Notes (Signed)
 PHARMACY - ANTICOAGULATION CONSULT NOTE  Pharmacy Consult for heparin  Indication: atrial fibrillation  Allergies  Allergen Reactions   Chlorthalidone  Other (See Comments)    ELEVATED KIDNEY FUNCTION     Patient Measurements: Height: 5\' 7"  (170.2 cm) Weight: 96.4 kg (212 lb 8.4 oz) IBW/kg (Calculated) : 61.6 HEPARIN  DW (KG): 84.8  Vital Signs: Temp: 99 F (37.2 C) (05/30 1754) Temp Source: Oral (05/30 1754) BP: 139/85 (05/30 1754) Pulse Rate: 94 (05/30 1754)  Labs: Recent Labs    09/03/23 0149 09/03/23 0806 09/04/23 0321 09/04/23 1836  HGB 9.1* 8.4* 7.8*  --   HCT 30.8* 29.0* 26.8*  --   PLT 373 361 306  --   APTT  --   --   --  80*  HEPARINUNFRC  --   --   --  >1.10*  CREATININE 2.06* 1.90* 2.01*  --   TROPONINIHS 27* 29*  --   --     Estimated Creatinine Clearance: 34.6 mL/min (A) (by C-G formula based on SCr of 2.01 mg/dL (H)).   Medical History: 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HCC)    Anemia    Chronic kidney disease    Essential hypertension 05/07/2021   GERD (gastroesophageal reflux disease)    Hernia, epigastric    Hypertension    PAF (paroxysmal atrial fibrillation) (HCC) 05/07/2021   Pure hypercholesterolemia 05/07/2021    Medications:  Medications Prior to Admission  Medication Sig Dispense Refill Last Dose/Taking   acetaminophen  (TYLENOL ) 500 MG tablet Take 2 tablets (1,000 mg total) by mouth every 6 (six) hours.   09/02/2023 at  3:37 PM   Amino Acids -Protein Hydrolys (FEEDING SUPPLEMENT, PRO-STAT 64,) LIQD Take 30 mLs by mouth daily at 6 (six) AM.   09/02/2023 at  9:00 AM   amiodarone  (PACERONE ) 200 MG tablet 1 po BID thru 09/17/23 then 1 po daily   09/02/2023 at  8:00 PM   apixaban (ELIQUIS) 5 MG TABS tablet Take 1 tablet (5 mg total) by mouth 2 (two) times daily.   09/02/2023 at  8:00 PM   atorvastatin  (LIPITOR ) 80 MG tablet Take 1 tablet (80 mg total) by mouth daily. 90 tablet 3 09/02/2023 at  6:00 PM   ferrous  sulfate 325 (65 FE) MG tablet Take 1 tablet (325 mg total) by mouth daily with breakfast. 30 tablet 0 09/02/2023 Evening   melatonin 3 MG TABS tablet Take 2 tablets (6 mg total) by mouth at bedtime as needed.   Unknown   metoprolol  tartrate (LOPRESSOR ) 50 MG tablet Take 1 tablet (50 mg total) by mouth 2 (two) times daily.   09/02/2023 at  8:00 PM   Multiple Vitamin (MULTIVITAMIN WITH MINERALS) TABS tablet Take 1 tablet by mouth daily.   09/02/2023 at  8:00 AM   [EXPIRED] oxyCODONE  (OXY IR/ROXICODONE ) 5 MG immediate release tablet Take 1-2 tablets (5-10 mg total) by mouth every 6 (six) hours as needed for up to 5 days for moderate pain (pain score 4-6) or severe pain (pain score 7-10). 30 tablet 0 08/31/2023   pantoprazole  (PROTONIX ) 40 MG tablet Take 1 tablet (40 mg total) by mouth daily.   09/02/2023 at  6:30 AM   polyethylene glycol (MIRALAX ) 17 g packet Take 17 g by mouth daily as needed for mild constipation.   Unknown   Scheduled:   atorvastatin   80 mg Oral Daily   Chlorhexidine  Gluconate Cloth  6 each Topical Daily   furosemide   60 mg Intravenous BID  metoprolol  tartrate  50 mg Oral BID   pantoprazole   40 mg Oral Daily    Assessment: 63 YO female presenting with shortness of breath and leaking ileostomy bag, sepsis suspected. Patient was recently admitted to [facility] (5/1-5/24) for ostomy placement secondary to SBO. CT abdomen shows intact midline wound without cellulitis or drainage--surgery suspects seroma/hematoma and not infectious. Patient has a history of afib on Eliquis  PTA (last dose 5/28 PM)--has been held this admission, per surgery. Per surgery, no surgical intervention at this time, but recommending initiation of heparin  in the meantime in case surgery is needed in the coming days.   Today, 09/04/23: Heparin  level is >1.10, falsely high due to recent apixaban  use  aPTT is 80 sec, therapeutic  Hgb 7.8 (BL upper 7s-8s)--low, stable Plts 306--stable Scr 2.01 (BL 2-2.5)--elevated,  stable Although LD Eliquis  5/28 PM, given poor renal function at baseline (may not be eliminating Eliquis  as quickly) and likely hematomas, will not bolus heparin  at this time. No s/sx of bleeding or line issues  reported per RN   Goal of Therapy:  Heparin  level 0.3-0.7 units/ml aPTT 66-102 seconds Monitor platelets by anticoagulation protocol: Yes   Plan:  Continue heparin  infusion at 1250 units/hr Check anti-Xa level and aPTT with AM labs  Continue to monitor H&H and platelets    Van Gelinas, PharmD, BCPS 09/04/2023 7:17 PM

## 2023-09-05 ENCOUNTER — Encounter (HOSPITAL_COMMUNITY): Payer: Self-pay | Admitting: Internal Medicine

## 2023-09-05 DIAGNOSIS — I5033 Acute on chronic diastolic (congestive) heart failure: Secondary | ICD-10-CM | POA: Diagnosis not present

## 2023-09-05 LAB — PHOSPHORUS: Phosphorus: 3.3 mg/dL (ref 2.5–4.6)

## 2023-09-05 LAB — COMPREHENSIVE METABOLIC PANEL WITH GFR
ALT: 9 U/L (ref 0–44)
AST: 11 U/L — ABNORMAL LOW (ref 15–41)
Albumin: 3 g/dL — ABNORMAL LOW (ref 3.5–5.0)
Alkaline Phosphatase: 70 U/L (ref 38–126)
Anion gap: 12 (ref 5–15)
BUN: 21 mg/dL (ref 8–23)
CO2: 25 mmol/L (ref 22–32)
Calcium: 8.6 mg/dL — ABNORMAL LOW (ref 8.9–10.3)
Chloride: 100 mmol/L (ref 98–111)
Creatinine, Ser: 2.17 mg/dL — ABNORMAL HIGH (ref 0.44–1.00)
GFR, Estimated: 25 mL/min — ABNORMAL LOW (ref >60)
Glucose, Bld: 108 mg/dL — ABNORMAL HIGH (ref 70–99)
Potassium: 3.7 mmol/L (ref 3.5–5.1)
Sodium: 137 mmol/L (ref 135–145)
Total Bilirubin: 1 mg/dL (ref 0.0–1.2)
Total Protein: 6.7 g/dL (ref 6.5–8.1)

## 2023-09-05 LAB — CBC WITH DIFFERENTIAL/PLATELET
Abs Immature Granulocytes: 0.07 10*3/uL (ref 0.00–0.07)
Basophils Absolute: 0.1 10*3/uL (ref 0.0–0.1)
Basophils Relative: 0 %
Eosinophils Absolute: 0.2 10*3/uL (ref 0.0–0.5)
Eosinophils Relative: 2 %
HCT: 27.1 % — ABNORMAL LOW (ref 36.0–46.0)
Hemoglobin: 8 g/dL — ABNORMAL LOW (ref 12.0–15.0)
Immature Granulocytes: 1 %
Lymphocytes Relative: 11 %
Lymphs Abs: 1.4 10*3/uL (ref 0.7–4.0)
MCH: 30.1 pg (ref 26.0–34.0)
MCHC: 29.5 g/dL — ABNORMAL LOW (ref 30.0–36.0)
MCV: 101.9 fL — ABNORMAL HIGH (ref 80.0–100.0)
Monocytes Absolute: 1.5 10*3/uL — ABNORMAL HIGH (ref 0.1–1.0)
Monocytes Relative: 11 %
Neutro Abs: 10.2 10*3/uL — ABNORMAL HIGH (ref 1.7–7.7)
Neutrophils Relative %: 75 %
Platelets: 331 10*3/uL (ref 150–400)
RBC: 2.66 MIL/uL — ABNORMAL LOW (ref 3.87–5.11)
RDW: 15.7 % — ABNORMAL HIGH (ref 11.5–15.5)
WBC: 13.4 10*3/uL — ABNORMAL HIGH (ref 4.0–10.5)
nRBC: 0 % (ref 0.0–0.2)

## 2023-09-05 LAB — IRON AND TIBC
Iron: 20 ug/dL — ABNORMAL LOW (ref 28–170)
Saturation Ratios: 12 % (ref 10.4–31.8)
TIBC: 174 ug/dL — ABNORMAL LOW (ref 250–450)
UIBC: 154 ug/dL

## 2023-09-05 LAB — FOLATE: Folate: 11.8 ng/mL (ref >5.9)

## 2023-09-05 LAB — VITAMIN B12: Vitamin B-12: 629 pg/mL (ref 180–914)

## 2023-09-05 LAB — FERRITIN: Ferritin: 372 ng/mL — ABNORMAL HIGH (ref 11–307)

## 2023-09-05 LAB — MAGNESIUM: Magnesium: 1.7 mg/dL (ref 1.7–2.4)

## 2023-09-05 LAB — APTT: aPTT: 70 s — ABNORMAL HIGH (ref 24–36)

## 2023-09-05 LAB — HEPARIN LEVEL (UNFRACTIONATED): Heparin Unfractionated: 0.89 [IU]/mL — ABNORMAL HIGH (ref 0.30–0.70)

## 2023-09-05 NOTE — Progress Notes (Signed)
 PROGRESS NOTE    [name]  ZOX:[id number] DOB: September 22, 1960 DOA: 09/03/2023 PCP: [name], MD  Chief Complaint  Patient presents with   Shortness of Breath    Brief Narrative:    Dorothy Harris is Dorothy Harris 63 y.o. 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  Recently admitted to [facility] from 5/1-5/24 for high-grade SBO status post ex lap with lysis of adhesions and resection of small bowel with end ileostomy revision on 5/15.  Also treated for AKI on CKD stage IV due to volume depletion/dehydration treated with IV fluids and acute anemia treated with Jaxon Flatt total of 3 units PRBCs.  Started on oral metoprolol  for Sherrilyn Nairn-fib and Eliquis  was resumed on discharge.  He was discharged to SNF.   Patient presents to the ED today from SNF for evaluation of leaking ileostomy bag.  Found to be septic with imaging notable for multiple abdominal fluid collections and volume overload.  Admitted for sepsis and concern for infection related to abdominal fluid collection.    Surgery has been consulted.    Assessment & Plan:   Principal Problem:   Acute on chronic heart failure with preserved ejection fraction (HFpEF) (HCC) Active Problems:   Acute hypoxemic respiratory failure (HCC)   PAF (paroxysmal atrial fibrillation) (HCC)   Essential hypertension   Stage 4 chronic kidney disease (HCC)   Ileus (HCC)   Edema of left upper extremity   QT prolongation   Sepsis (HCC)  Systemic Inflammatory Response Syndrome Afebrile, but leukocytosis, tachycardia, tachypnea at presentation - per discussion with surgery, low suspicion intraabdominal fluid collections are infected.  Another potential infectious source are ground glass opacities in lungs, but suspect these maybe more likely due  to volume pulmonary edema?  Volume overload contributing to tachycardia/tachypnea at presentation. Currently narrowed to ancef  to cover mild cellulitis around incision Blood cultures NGx2  Multiple abdominal fluid collections concerning (low suspicion for abscess, suspected seromas/hematomas) Ileus versus low-grade SBO Ostomy leakage History of recent high-grade SBO status post ex lap with lysis of adhesions and resection of small bowel with end ileostomy revision on 5/15  No lactic acidosis or hypotension.    General surgery consulted, appreciate recs.  Mild cellulitis around incision, fluid collections suspected seromas/hematomas - no plan for surgery - surgery planning repeat CT in AM Trend WBC count and follow-up blood cultures.   Acute on chronic HFpEF Acute hypoxemic respiratory failure Pulmonary Edema Anasarca Volume Overload Grossly overloaded with bilateral LE edema.  CT abd/pelvis with ground glass opacities in R middle and lower lobes (suspect ground glass edema given her clinical picture) ~ 86 kg on 5/4.   103 kg on 5/29.  95 kg today.  Net negative 7 L. Will diurese as tolerated (low albumin , will give some albumin  as well)  Echo 08/2023 with EF 60-65%, no RWMA.  Severe asymmetric LVH.  Diastolic function could not be evaluated.  Trivial MR.  Moderate calcification of AV.   Strict I/O, daily weights  Ground-glass opacities in the right middle and lower lobes  Suspect due to volume overload rather than infectious cause Diuresis as above Negative covid, flu, RSV   Left upper extremity edema  Acute Superficial Vein Thrombosis of L Cephalic Vein Supportive care  Troponin elevation Mild elevation, she's without chest pain.  EKG with artifact, but sinus (p  waves difficult to see, but I think present) tach, isolated borderline ST elevation in lead III.  T wave inversions in I, aVL.  Similar to prior EKG. Low suspicion for ACS.  Troponin low and flat.   QT prolongation QTc  597 on EKG and patient is on amiodarone .  Potassium is within normal range.  Check magnesium  level and replace if low.  Avoid QT prolonging drugs and follow-up repeat EKG in the morning.   Paroxysmal Kem Parcher-fib Heparin  gtt for now.  Continue metoprolol .   Holding amiodarone  at this time due to QT prolongation.   Repeat EKG for QT eval    CKD stage IV Creatinine 2.0, stable.  Continue to monitor renal function with diuresis.   Hypertension Follow with diuresis, metop   Hyperlipidemia Continue Lipitor .   Anemia Treated with 3 units PRBCs during recent admission Hb fluctuating, will trend  Labs c/w iron def, aocd   GERD Continue Protonix .   Incidental stable AAA Patient will need outpatient follow-up with vascular surgery for follow-up CTA or MRA in 6 months      DVT prophylaxis: SCD Code Status: DNR Family Communication: none Disposition:   Status is: Inpatient Remains inpatient appropriate because: need for ongoing inpatient care   Consultants:  Surgery  Procedures:  none  Antimicrobials:  Anti-infectives (From admission, onward)    Start     Dose/Rate Route Frequency Ordered Stop   09/04/23 1600  vancomycin  (VANCOCIN ) IVPB 1000 mg/200 mL premix  Status:  Discontinued        1,000 mg 200 mL/hr over 60 Minutes Intravenous Every 36 hours 09/03/23 0655 09/04/23 0815   09/04/23 1000  ceFAZolin  (ANCEF ) IVPB 2g/100 mL premix  Status:  Discontinued        2 g 200 mL/hr over 30 Minutes Intravenous Every 8 hours 09/04/23 0815 09/04/23 0901   09/04/23 1000  ceFAZolin  (ANCEF ) IVPB 2g/100 mL premix        2 g 200 mL/hr over 30 Minutes Intravenous Every 12 hours 09/04/23 0901 09/11/23 0959   09/03/23 1600  metroNIDAZOLE  (FLAGYL ) IVPB 500 mg  Status:  Discontinued        500 mg 100 mL/hr over 60 Minutes Intravenous Every 12 hours 09/03/23 0643 09/04/23 0815   09/03/23 1500  ceFEPIme  (MAXIPIME ) 2 g in sodium chloride  0.9 % 100 mL IVPB  Status:  Discontinued        2 g 200  mL/hr over 30 Minutes Intravenous Every 12 hours 09/03/23 0655 09/04/23 0815   09/03/23 0400  metroNIDAZOLE  (FLAGYL ) IVPB 500 mg        500 mg 100 mL/hr over 60 Minutes Intravenous  Once 09/03/23 0358 09/03/23 0535   09/03/23 0330  vancomycin  (VANCOREADY) IVPB 1750 mg/350 mL        1,750 mg 175 mL/hr over 120 Minutes Intravenous  Once 09/03/23 0325 09/03/23 0608   09/03/23 0300  ceFEPIme  (MAXIPIME ) 2 g in sodium chloride  0.9 % 100 mL IVPB        2 g 200 mL/hr over 30 Minutes Intravenous  Once 09/03/23 0251 09/03/23 0336       Subjective: Swelling gradually better  Objective: Vitals:   09/04/23 2204 09/05/23 0229 09/05/23 0500 09/05/23 0610  BP: (!) 123/97 138/87  130/84  Pulse: 86 98  91  Resp:      Temp: 98.1 F (36.7 C) 99.1 F (37.3 C)  98.2 F (36.8 C)  TempSrc:  Oral  Oral  SpO2: 100% 100%  100%  Weight:  95 kg   Height:        Intake/Output Summary (Last 24 hours) at 09/05/2023 1239 Last data filed at 09/05/2023 1009 Gross per 24 hour  Intake 240 ml  Output 3425 ml  Net -3185 ml   Filed Weights   09/03/23 0131 09/04/23 0500 09/05/23 0500  Weight: 103 kg 96.4 kg 95 kg    Examination:  General: No acute distress. Cardiovascular: RRR Lungs: unlabored Ostomy, midline surgical incision Neurological: Alert and oriented 3. Moves all extremities 4 with equal strength. Cranial nerves II through XII grossly intact. Extremities: continued edema, LUE edema improving, continued dependent and LE edema    Data Reviewed: I have personally reviewed following labs and imaging studies  CBC: Recent Labs  Lab 09/03/23 0149 09/03/23 0806 09/04/23 0321 09/05/23 0438  WBC 15.4* 15.1* 12.6* 13.4*  NEUTROABS 12.4*  --  9.6* 10.2*  HGB 9.1* 8.4* 7.8* 8.0*  HCT 30.8* 29.0* 26.8* 27.1*  MCV 102.7* 106.6* 105.5* 101.9*  PLT 373 361 306 331    Basic Metabolic Panel: Recent Labs  Lab 09/03/23 0149 09/03/23 0806 09/04/23 0321 09/05/23 0438  NA 138 140 140 137  K  4.4 4.3 4.3 3.7  CL 103 107 106 100  CO2 25 23 25 25   GLUCOSE 142* 104* 108* 108*  BUN 20 20 21 21   CREATININE 2.06* 1.90* 2.01* 2.17*  CALCIUM  8.2* 8.3* 8.2* 8.6*  MG  --  2.1 1.9 1.7  PHOS  --   --  3.7 3.3    GFR: Estimated Creatinine Clearance: 31.8 mL/min (Sharlon Pfohl) (by C-G formula based on SCr of 2.17 mg/dL (H)).  Liver Function Tests: Recent Labs  Lab 09/03/23 0149 09/04/23 0321 09/05/23 0438  AST 18 13* 11*  ALT 18 14 9   ALKPHOS 107 71 70  BILITOT 1.1 1.3* 1.0  PROT 7.1 6.3* 6.7  ALBUMIN  2.3* 2.5* 3.0*    CBG: Recent Labs  Lab 08/29/23 1645 08/29/23 2049 08/30/23 0733  GLUCAP 116* 104* 91     Recent Results (from the past 240 hours)  Resp panel by RT-PCR (RSV, Flu Tamorah Hada&B, Covid) Anterior Nasal Swab     Status: None   Collection Time: 09/03/23  1:49 AM   Specimen: Anterior Nasal Swab  Result Value Ref Range Status   SARS Coronavirus 2 by RT PCR NEGATIVE NEGATIVE Final    Comment: (NOTE) SARS-CoV-2 target nucleic acids are NOT DETECTED.  The SARS-CoV-2 RNA is generally detectable in upper respiratory specimens during the acute phase of infection. The lowest concentration of SARS-CoV-2 viral copies this assay can detect is 138 copies/mL. Allard Lightsey negative result does not preclude SARS-Cov-2 infection and should not be used as the sole basis for treatment or other patient management decisions. Michial Disney negative result may occur with  improper specimen collection/handling, submission of specimen other than nasopharyngeal swab, presence of viral mutation(s) within the areas targeted by this assay, and inadequate number of viral copies(<138 copies/mL). Cameron Schwinn negative result must be combined with clinical observations, patient history, and epidemiological information. The expected result is Negative.  Fact Sheet for Patients:  BloggerCourse.com  Fact Sheet for Healthcare Providers:  SeriousBroker.it  This test is no t yet approved  or cleared by the [place]  FDA and  has been authorized for detection and/or diagnosis of SARS-CoV-2 by FDA under an Emergency Use Authorization (EUA). This EUA will remain  in effect (meaning this test can be used) for the duration of the COVID-19 declaration under Section 564(b)(1) of the Act,  21 U.S.C.section 360bbb-3(b)(1), unless the authorization is terminated  or revoked sooner.       Influenza Richards Pherigo by PCR NEGATIVE NEGATIVE Final   Influenza B by PCR NEGATIVE NEGATIVE Final    Comment: (NOTE) The Xpert Xpress SARS-CoV-2/FLU/RSV plus assay is intended as an aid in the diagnosis of influenza from Nasopharyngeal swab specimens and should not be used as Tiernan Suto sole basis for treatment. Nasal washings and aspirates are unacceptable for Xpert Xpress SARS-CoV-2/FLU/RSV testing.  Fact Sheet for Patients: BloggerCourse.com  Fact Sheet for Healthcare Providers: SeriousBroker.it  This test is not yet approved or cleared by the [place]  FDA and has been authorized for detection and/or diagnosis of SARS-CoV-2 by FDA under an Emergency Use Authorization (EUA). This EUA will remain in effect (meaning this test can be used) for the duration of the COVID-19 declaration under Section 564(b)(1) of the Act, 21 U.S.C. section 360bbb-3(b)(1), unless the authorization is terminated or revoked.     Resp Syncytial Virus by PCR NEGATIVE NEGATIVE Final    Comment: (NOTE) Fact Sheet for Patients: BloggerCourse.com  Fact Sheet for Healthcare Providers: SeriousBroker.it  This test is not yet approved or cleared by the [place]  FDA and has been authorized for detection and/or diagnosis of SARS-CoV-2 by FDA under an Emergency Use Authorization (EUA). This EUA will remain in effect (meaning this test can be used) for the duration of the COVID-19 declaration under Section 564(b)(1) of the Act,  21 U.S.C. section 360bbb-3(b)(1), unless the authorization is terminated or revoked.  Performed at [facility], [address] [place], [place] [address]   Blood culture (routine x 2)     Status: None (Preliminary result)   Collection Time: 09/03/23  1:49 AM   Specimen: BLOOD  Result Value Ref Range Status   Specimen Description   Final    BLOOD RIGHT ANTECUBITAL Performed at [facility], [address] [place], [place] [address]    Special Requests   Final    BOTTLES DRAWN AEROBIC AND ANAEROBIC Blood Culture adequate volume Performed at [facility], [address] [place], [place] [address]    Culture   Final    NO GROWTH 2 DAYS Performed at [facility], [address] [place], [place] [address]    Report Status PENDING  Incomplete  Blood culture (routine x 2)     Status: None (Preliminary result)   Collection Time: 09/03/23  3:00 AM   Specimen: BLOOD  Result Value Ref Range Status   Specimen Description   Final    BLOOD BLOOD RIGHT WRIST Performed at [facility], [address] [place], [place] [address]    Special Requests   Final    BOTTLES DRAWN AEROBIC AND ANAEROBIC Blood Culture adequate volume Performed at [facility], [address], [place], [place] [address]    Culture   Final    NO GROWTH 2 DAYS Performed at [facility], [address], [place], [place] [address]    Report Status PENDING  Incomplete  MRSA Next Gen by PCR, Nasal     Status: None   Collection Time: 09/03/23  4:37 AM   Specimen: Nasal Mucosa; Nasal Swab  Result Value Ref Range Status   MRSA by PCR Next Gen NOT DETECTED NOT DETECTED Final    Comment: (NOTE) The GeneXpert MRSA Assay (FDA approved for NASAL specimens only), is one component of Janequa Kipnis comprehensive MRSA colonization surveillance program. It  is not intended to diagnose MRSA infection nor to guide or monitor treatment  for MRSA infections. Test performance is not FDA approved in patients less than 91 years old. Performed at [facility], [address], [place], [place] [address]          Radiology Studies: No results found.       Scheduled Meds:  atorvastatin   80 mg Oral Daily   Chlorhexidine  Gluconate Cloth  6 each Topical Daily   furosemide   60 mg Intravenous BID   metoprolol  tartrate  50 mg Oral BID   pantoprazole   40 mg Oral Daily   Continuous Infusions:   ceFAZolin  (ANCEF ) IV 2 g (09/05/23 1009)   heparin  1,250 Units/hr (09/05/23 0719)     LOS: 2 days    Time spent: over 30 min    Donnetta Gains, MD [facility]   To contact the attending provider between 7A-7P or the covering provider during after hours 7P-7A, please log into the web site [email or web address] and access using universal [place] password for that web site. If you do not have the password, please call the hospital operator.  09/05/2023, 12:39 PM

## 2023-09-05 NOTE — Progress Notes (Addendum)
 Subjective/Chief Complaint: Tol diet , no real complaints today   Objective: Vital signs in last 24 hours: Temp:  [98.1 F (36.7 C)-99.1 F (37.3 C)] 98.2 F (36.8 C) (05/31 0610) Pulse Rate:  [86-98] 91 (05/31 0610) Resp:  [20] 20 (05/30 1754) BP: (123-139)/(84-97) 130/84 (05/31 0610) SpO2:  [100 %] 100 % (05/31 0610) Weight:  [95 kg] 95 kg (05/31 0500) Last BM Date : 09/04/23  Intake/Output from previous day: 05/30 0701 - 05/31 0700 In: 590.1 [P.O.:360; I.V.:20.3; IV Piggyback:209.8] Out: 3925 [Urine:1150; Stool:2775] Intake/Output this shift: Total I/O In: -  Out: 100 [Stool:100]  Ab soft approp tender, ileostomy functional, wound without infection, staples intact  Lab Results:  Recent Labs    09/04/23 0321 09/05/23 0438  WBC 12.6* 13.4*  HGB 7.8* 8.0*  HCT 26.8* 27.1*  PLT 306 331   BMET Recent Labs    09/04/23 0321 09/05/23 0438  NA 140 137  K 4.3 3.7  CL 106 100  CO2 25 25  GLUCOSE 108* 108*  BUN 21 21  CREATININE 2.01* 2.17*  CALCIUM  8.2* 8.6*   PT/INR No results for input(s): "LABPROT", "INR" in the last 72 hours. ABG No results for input(s): "PHART", "HCO3" in the last 72 hours.  Invalid input(s): "PCO2", "PO2"  Studies/Results: VAS US  LOWER EXTREMITY VENOUS (DVT) Result Date: 09/03/2023  Lower Venous DVT Study Patient Name:  Dorothy Harris [facility]  Date of Exam:   09/03/2023 Medical Rec #: [id number]      Accession #:    [id number] Date of Birth: [birth year]      Patient Gender: F Patient Age:   63 years Exam Location:  [facility] Procedure:      VAS US  LOWER EXTREMITY VENOUS (DVT) Referring Phys: A POWELL JR --------------------------------------------------------------------------------  Indications: Swelling.  Risk Factors: None identified. Limitations: Poor ultrasound/tissue interface. Comparison Study: No prior studies. Performing Technologist: Lerry Ransom RVT  Examination Guidelines: A complete evaluation includes B-mode imaging,  spectral Doppler, color Doppler, and power Doppler as needed of all accessible portions of each vessel. Bilateral testing is considered an integral part of a complete examination. Limited examinations for reoccurring indications may be performed as noted. The reflux portion of the exam is performed with the patient in reverse Trendelenburg.  +---------+---------------+---------+-----------+----------+--------------+ RIGHT    CompressibilityPhasicitySpontaneityPropertiesThrombus Aging +---------+---------------+---------+-----------+----------+--------------+ CFV      Full           Yes      Yes                                 +---------+---------------+---------+-----------+----------+--------------+ SFJ      Full                                                        +---------+---------------+---------+-----------+----------+--------------+ FV Prox  Full                                                        +---------+---------------+---------+-----------+----------+--------------+ FV Mid   Full                                                        +---------+---------------+---------+-----------+----------+--------------+  FV DistalFull                                                        +---------+---------------+---------+-----------+----------+--------------+ PFV      Full                                                        +---------+---------------+---------+-----------+----------+--------------+ POP      Full           Yes      Yes                                 +---------+---------------+---------+-----------+----------+--------------+ PTV      Full                                                        +---------+---------------+---------+-----------+----------+--------------+ PERO     Full                                                        +---------+---------------+---------+-----------+----------+--------------+    +---------+---------------+---------+-----------+----------+--------------+ LEFT     CompressibilityPhasicitySpontaneityPropertiesThrombus Aging +---------+---------------+---------+-----------+----------+--------------+ CFV      Full           Yes      Yes                                 +---------+---------------+---------+-----------+----------+--------------+ SFJ      Full                                                        +---------+---------------+---------+-----------+----------+--------------+ FV Prox  Full                                                        +---------+---------------+---------+-----------+----------+--------------+ FV Mid   Full                                                        +---------+---------------+---------+-----------+----------+--------------+ FV DistalFull                                                        +---------+---------------+---------+-----------+----------+--------------+  PFV      Full                                                        +---------+---------------+---------+-----------+----------+--------------+ POP      Full           Yes      Yes                                 +---------+---------------+---------+-----------+----------+--------------+ PTV      Full                                                        +---------+---------------+---------+-----------+----------+--------------+ PERO     Full                                                        +---------+---------------+---------+-----------+----------+--------------+     Summary: RIGHT: - There is no evidence of deep vein thrombosis in the lower extremity.  - No cystic structure found in the popliteal fossa.  LEFT: - There is no evidence of deep vein thrombosis in the lower extremity.  - No cystic structure found in the popliteal fossa.  *See table(s) above for measurements and observations. Electronically signed  by Runell Countryman on 09/03/2023 at 6:18:34 PM.    Final    VAS US  UPPER EXTREMITY VENOUS DUPLEX Result Date: 09/03/2023 UPPER VENOUS STUDY  Patient Name:  Dorothy Harris [facility]  Date of Exam:   09/03/2023 Medical Rec #: [id number]      Accession #:    [id number] Date of Birth: 06-Aug-1960      Patient Gender: F Patient Age:   21 years Exam Location:  [facility] Procedure:      VAS US  UPPER EXTREMITY VENOUS DUPLEX Referring Phys: Corky Diener RATHORE --------------------------------------------------------------------------------  Indications: Swelling Risk Factors: None identified. Comparison Study: No prior studies. Performing Technologist: Lerry Ransom RVT  Examination Guidelines: A complete evaluation includes B-mode imaging, spectral Doppler, color Doppler, and power Doppler as needed of all accessible portions of each vessel. Bilateral testing is considered an integral part of a complete examination. Limited examinations for reoccurring indications may be performed as noted.  Right Findings: +----------+------------+---------+-----------+----------+-------+ RIGHT     CompressiblePhasicitySpontaneousPropertiesSummary +----------+------------+---------+-----------+----------+-------+ Subclavian    Full       Yes       Yes                      +----------+------------+---------+-----------+----------+-------+  Left Findings: +----------+------------+---------+-----------+----------+-------+ LEFT      CompressiblePhasicitySpontaneousPropertiesSummary +----------+------------+---------+-----------+----------+-------+ IJV           Full       Yes       Yes                      +----------+------------+---------+-----------+----------+-------+ Subclavian               Yes  Yes                      +----------+------------+---------+-----------+----------+-------+ Axillary      Full       Yes       Yes                       +----------+------------+---------+-----------+----------+-------+ Brachial      Full                                          +----------+------------+---------+-----------+----------+-------+ Radial        Full                                          +----------+------------+---------+-----------+----------+-------+ Ulnar         Full                                          +----------+------------+---------+-----------+----------+-------+ Cephalic      None                                   Acute  +----------+------------+---------+-----------+----------+-------+ Basilic       Full                                          +----------+------------+---------+-----------+----------+-------+ Thrombus located in the cephalic vein is noted to extend from the mid upper arm into the distal forearm.  Summary:  Right: No evidence of thrombosis in the subclavian.  Left: No evidence of deep vein thrombosis in the upper extremity. Findings consistent with acute superficial vein thrombosis involving the left cephalic vein.  *See table(s) above for measurements and observations.  Diagnosing physician: Runell Countryman Electronically signed by Runell Countryman on 09/03/2023 at 6:18:23 PM.    Final     Anti-infectives: Anti-infectives (From admission, onward)    Start     Dose/Rate Route Frequency Ordered Stop   09/04/23 1600  vancomycin  (VANCOCIN ) IVPB 1000 mg/200 mL premix  Status:  Discontinued        1,000 mg 200 mL/hr over 60 Minutes Intravenous Every 36 hours 09/03/23 0655 09/04/23 0815   09/04/23 1000  ceFAZolin  (ANCEF ) IVPB 2g/100 mL premix  Status:  Discontinued        2 g 200 mL/hr over 30 Minutes Intravenous Every 8 hours 09/04/23 0815 09/04/23 0901   09/04/23 1000  ceFAZolin  (ANCEF ) IVPB 2g/100 mL premix        2 g 200 mL/hr over 30 Minutes Intravenous Every 12 hours 09/04/23 0901 09/11/23 0959   09/03/23 1600  metroNIDAZOLE  (FLAGYL ) IVPB 500 mg  Status:  Discontinued         500 mg 100 mL/hr over 60 Minutes Intravenous Every 12 hours 09/03/23 0643 09/04/23 0815   09/03/23 1500  ceFEPIme  (MAXIPIME ) 2 g in sodium chloride  0.9 % 100 mL IVPB  Status:  Discontinued        2 g 200 mL/hr over 30 Minutes Intravenous Every 12 hours  09/03/23 0655 09/04/23 0815   09/03/23 0400  metroNIDAZOLE  (FLAGYL ) IVPB 500 mg        500 mg 100 mL/hr over 60 Minutes Intravenous  Once 09/03/23 0358 09/03/23 0535   09/03/23 0330  vancomycin  (VANCOREADY) IVPB 1750 mg/350 mL        1,750 mg 175 mL/hr over 120 Minutes Intravenous  Once 09/03/23 0325 09/03/23 0608   09/03/23 0300  ceFEPIme  (MAXIPIME ) 2 g in sodium chloride  0.9 % 100 mL IVPB        2 g 200 mL/hr over 30 Minutes Intravenous  Once 09/03/23 0251 09/03/23 0336       Assessment/Plan: POD 16 - s/p ex lap, SBR, LOA, ileostomy revision, VHR at [facility] - Dr. Collene Dawson - continue IV Cefepime , Vanc - WBC still elevated -will plan to repeat ct in am to reassess these collections, with wbc still up may just need to have aspirated/drained by IR - soft diet - monitor midline wound    FEN - soft diet VTE - SCD's, recommend hep gtt instead of DOAC at this time for afib ID - cefepime , vanc Admit - [facility] service    ?possible pneumonia/pneumonitis  CHFpEF HLD Hx aortic dissection Obesity CKD IV afib  I reviewed hospitalist notes, last 24 h vitals and pain scores, last 48 h intake and output, last 24 h labs and trends, and last 24 h imaging results.   Dorothy Harris 09/05/2023

## 2023-09-05 NOTE — Progress Notes (Signed)
 PHARMACY - ANTICOAGULATION CONSULT NOTE  Pharmacy Consult for heparin  Indication: atrial fibrillation  Allergies  Allergen Reactions   Chlorthalidone  Other (See Comments)    ELEVATED KIDNEY FUNCTION     Patient Measurements: Height: 5\' 7"  (170.2 cm) Weight: 95 kg (209 lb 7 oz) IBW/kg (Calculated) : 61.6 HEPARIN  DW (KG): 84.8  Vital Signs: Temp: 98.2 F (36.8 C) (05/31 0610) Temp Source: Oral (05/31 0610) BP: 130/84 (05/31 0610) Pulse Rate: 91 (05/31 0610)  Labs: Recent Labs    09/03/23 0149 09/03/23 0806 09/04/23 0321 09/04/23 1836 09/05/23 0438  HGB 9.1* 8.4* 7.8*  --  8.0*  HCT 30.8* 29.0* 26.8*  --  27.1*  PLT 373 361 306  --  331  APTT  --   --   --  80* 70*  HEPARINUNFRC  --   --   --  >1.10* 0.89*  CREATININE 2.06* 1.90* 2.01*  --  2.17*  TROPONINIHS 27* 29*  --   --   --     Estimated Creatinine Clearance: 31.8 mL/min (A) (by C-G formula based on SCr of 2.17 mg/dL (H)).   Medical History: 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HCC)    Anemia    Chronic kidney disease    Essential hypertension 05/07/2021   GERD (gastroesophageal reflux disease)    Hernia, epigastric    Hypertension    PAF (paroxysmal atrial fibrillation) (HCC) 05/07/2021   Pure hypercholesterolemia 05/07/2021    Medications:  Medications Prior to Admission  Medication Sig Dispense Refill Last Dose/Taking   acetaminophen  (TYLENOL ) 500 MG tablet Take 2 tablets (1,000 mg total) by mouth every 6 (six) hours.   09/02/2023 at  3:37 PM   Amino Acids -Protein Hydrolys (FEEDING SUPPLEMENT, PRO-STAT 64,) LIQD Take 30 mLs by mouth daily at 6 (six) AM.   09/02/2023 at  9:00 AM   amiodarone  (PACERONE ) 200 MG tablet 1 po BID thru 09/17/23 then 1 po daily   09/02/2023 at  8:00 PM   apixaban  (ELIQUIS ) 5 MG TABS tablet Take 1 tablet (5 mg total) by mouth 2 (two) times daily.   09/02/2023 at  8:00 PM   atorvastatin  (LIPITOR ) 80 MG tablet Take 1 tablet (80 mg total) by mouth daily.  90 tablet 3 09/02/2023 at  6:00 PM   ferrous sulfate  325 (65 FE) MG tablet Take 1 tablet (325 mg total) by mouth daily with breakfast. 30 tablet 0 09/02/2023 Evening   melatonin 3 MG TABS tablet Take 2 tablets (6 mg total) by mouth at bedtime as needed.   Unknown   metoprolol  tartrate (LOPRESSOR ) 50 MG tablet Take 1 tablet (50 mg total) by mouth 2 (two) times daily.   09/02/2023 at  8:00 PM   Multiple Vitamin (MULTIVITAMIN WITH MINERALS) TABS tablet Take 1 tablet by mouth daily.   09/02/2023 at  8:00 AM   [EXPIRED] oxyCODONE  (OXY IR/ROXICODONE ) 5 MG immediate release tablet Take 1-2 tablets (5-10 mg total) by mouth every 6 (six) hours as needed for up to 5 days for moderate pain (pain score 4-6) or severe pain (pain score 7-10). 30 tablet 0 08/31/2023   pantoprazole  (PROTONIX ) 40 MG tablet Take 1 tablet (40 mg total) by mouth daily.   09/02/2023 at  6:30 AM   polyethylene glycol (MIRALAX ) 17 g packet Take 17 g by mouth daily as needed for mild constipation.   Unknown   Scheduled:   atorvastatin   80 mg Oral Daily   Chlorhexidine  Gluconate Cloth  6  each Topical Daily   furosemide   60 mg Intravenous BID   metoprolol  tartrate  50 mg Oral BID   pantoprazole   40 mg Oral Daily    Assessment: 63 YO female presenting with shortness of breath and leaking ileostomy bag, sepsis suspected. Patient was recently admitted to [facility] (5/1-5/24) for ostomy placement secondary to SBO. CT abdomen shows intact midline wound without cellulitis or drainage--surgery suspects seroma/hematoma and not infectious. Patient has a history of afib on Eliquis  PTA (last dose 5/28 PM)--has been held this admission, per surgery. Per surgery, no surgical intervention at this time, but recommending initiation of heparin  in the meantime in case surgery is needed in the coming days.   Today, 09/05/23: Heparin  level = 0.89 (still falsely elevated due to recent apixaban  use; however now trending down) aPTT is 70 sec, therapeutic with heparin  gtt  @ 1250 units/hr Hgb 8 (BL upper 7s-8s)--low, stable Plts 331--stable Scr 2.17 (BL 2-2.5)--elevated, stable Although LD Eliquis  5/28 PM, given poor renal function at baseline (may not be eliminating Eliquis  as quickly) and likely hematomas, will not bolus heparin  at this time. No s/sx of bleeding or line issues noted  Goal of Therapy:  Heparin  level 0.3-0.7 units/ml aPTT 66-102 seconds Monitor platelets by anticoagulation protocol: Yes   Plan:  Continue heparin  infusion at 1250 units/hr Check daily anti-Xa level and aPTT Continue to monitor H&H and platelets   Agnes Hose, PharmD 09/05/2023 6:22 AM

## 2023-09-06 ENCOUNTER — Inpatient Hospital Stay (HOSPITAL_COMMUNITY)

## 2023-09-06 DIAGNOSIS — I5033 Acute on chronic diastolic (congestive) heart failure: Secondary | ICD-10-CM | POA: Diagnosis not present

## 2023-09-06 LAB — CBC
HCT: 28.1 % — ABNORMAL LOW (ref 36.0–46.0)
Hemoglobin: 8.4 g/dL — ABNORMAL LOW (ref 12.0–15.0)
MCH: 30.3 pg (ref 26.0–34.0)
MCHC: 29.9 g/dL — ABNORMAL LOW (ref 30.0–36.0)
MCV: 101.4 fL — ABNORMAL HIGH (ref 80.0–100.0)
Platelets: 353 10*3/uL (ref 150–400)
RBC: 2.77 MIL/uL — ABNORMAL LOW (ref 3.87–5.11)
RDW: 15.4 % (ref 11.5–15.5)
WBC: 12.9 10*3/uL — ABNORMAL HIGH (ref 4.0–10.5)
nRBC: 0 % (ref 0.0–0.2)

## 2023-09-06 LAB — COMPREHENSIVE METABOLIC PANEL WITH GFR
ALT: 6 U/L (ref 0–44)
AST: 10 U/L — ABNORMAL LOW (ref 15–41)
Albumin: 2.9 g/dL — ABNORMAL LOW (ref 3.5–5.0)
Alkaline Phosphatase: 69 U/L (ref 38–126)
Anion gap: 11 (ref 5–15)
BUN: 23 mg/dL (ref 8–23)
CO2: 26 mmol/L (ref 22–32)
Calcium: 8.6 mg/dL — ABNORMAL LOW (ref 8.9–10.3)
Chloride: 99 mmol/L (ref 98–111)
Creatinine, Ser: 2.45 mg/dL — ABNORMAL HIGH (ref 0.44–1.00)
GFR, Estimated: 22 mL/min — ABNORMAL LOW (ref >60)
Glucose, Bld: 105 mg/dL — ABNORMAL HIGH (ref 70–99)
Potassium: 3.7 mmol/L (ref 3.5–5.1)
Sodium: 136 mmol/L (ref 135–145)
Total Bilirubin: 0.6 mg/dL (ref 0.0–1.2)
Total Protein: 6.9 g/dL (ref 6.5–8.1)

## 2023-09-06 LAB — HEPARIN LEVEL (UNFRACTIONATED): Heparin Unfractionated: 0.41 [IU]/mL (ref 0.30–0.70)

## 2023-09-06 LAB — APTT: aPTT: 93 s — ABNORMAL HIGH (ref 24–36)

## 2023-09-06 MED ORDER — PIPERACILLIN-TAZOBACTAM 3.375 G IVPB
3.3750 g | Freq: Three times a day (TID) | INTRAVENOUS | Status: DC
  Administered 2023-09-06 – 2023-09-10 (×12): 3.375 g via INTRAVENOUS
  Filled 2023-09-06 (×12): qty 50

## 2023-09-06 MED ORDER — TRAMADOL HCL 50 MG PO TABS
50.0000 mg | ORAL_TABLET | Freq: Once | ORAL | Status: AC
  Administered 2023-09-06: 50 mg via ORAL
  Filled 2023-09-06: qty 1

## 2023-09-06 MED ORDER — IOHEXOL 9 MG/ML PO SOLN
500.0000 mL | ORAL | Status: AC
  Administered 2023-09-06: 500 mL via ORAL

## 2023-09-06 MED ORDER — OXYCODONE HCL 5 MG PO TABS
5.0000 mg | ORAL_TABLET | ORAL | Status: DC | PRN
  Administered 2023-09-06 – 2023-09-17 (×19): 5 mg via ORAL
  Filled 2023-09-06 (×19): qty 1

## 2023-09-06 MED ORDER — ALBUMIN HUMAN 25 % IV SOLN
25.0000 g | Freq: Once | INTRAVENOUS | Status: AC
  Administered 2023-09-06: 25 g via INTRAVENOUS
  Filled 2023-09-06: qty 100

## 2023-09-06 NOTE — Progress Notes (Signed)
 Pt care assumed from previous RN at 1500 this shift. Previous assessment has been reviewed. Pt resting comfortably in bed at this time in no acute distress. Continue plan of care.

## 2023-09-06 NOTE — Progress Notes (Signed)
 Subjective/Chief Complaint: Unchanged, tol diet, having bowel function   Objective: Vital signs in last 24 hours: Temp:  [98.3 F (36.8 C)-98.6 F (37 C)] 98.3 F (36.8 C) (06/01 0515) Pulse Rate:  [91-110] 110 (06/01 0515) Resp:  [17] 17 (05/31 1312) BP: (114-127)/(75-87) 118/83 (06/01 0515) SpO2:  [100 %] 100 % (06/01 0515) Weight:  [88.6 kg] 88.6 kg (06/01 0515) Last BM Date : 09/05/23  Intake/Output from previous day: 05/31 0701 - 06/01 0700 In: 600 [P.O.:600] Out: 2425 [Stool:2425] Intake/Output this shift: Total I/O In: -  Out: 50 [Stool:50]  Ab soft approp tender, ileal conduit in place, wound without infection  Lab Results:  Recent Labs    09/05/23 0438 09/06/23 0338  WBC 13.4* 12.9*  HGB 8.0* 8.4*  HCT 27.1* 28.1*  PLT 331 353   BMET Recent Labs    09/05/23 0438 09/06/23 0338  NA 137 136  K 3.7 3.7  CL 100 99  CO2 25 26  GLUCOSE 108* 105*  BUN 21 23  CREATININE 2.17* 2.45*  CALCIUM  8.6* 8.6*   PT/INR No results for input(s): "LABPROT", "INR" in the last 72 hours. ABG No results for input(s): "PHART", "HCO3" in the last 72 hours.  Invalid input(s): "PCO2", "PO2"  Studies/Results: No results found.  Anti-infectives: Anti-infectives (From admission, onward)    Start     Dose/Rate Route Frequency Ordered Stop   09/04/23 1600  vancomycin  (VANCOCIN ) IVPB 1000 mg/200 mL premix  Status:  Discontinued        1,000 mg 200 mL/hr over 60 Minutes Intravenous Every 36 hours 09/03/23 0655 09/04/23 0815   09/04/23 1000  ceFAZolin  (ANCEF ) IVPB 2g/100 mL premix  Status:  Discontinued        2 g 200 mL/hr over 30 Minutes Intravenous Every 8 hours 09/04/23 0815 09/04/23 0901   09/04/23 1000  ceFAZolin  (ANCEF ) IVPB 2g/100 mL premix        2 g 200 mL/hr over 30 Minutes Intravenous Every 12 hours 09/04/23 0901 09/11/23 0959   09/03/23 1600  metroNIDAZOLE  (FLAGYL ) IVPB 500 mg  Status:  Discontinued        500 mg 100 mL/hr over 60 Minutes Intravenous  Every 12 hours 09/03/23 0643 09/04/23 0815   09/03/23 1500  ceFEPIme  (MAXIPIME ) 2 g in sodium chloride  0.9 % 100 mL IVPB  Status:  Discontinued        2 g 200 mL/hr over 30 Minutes Intravenous Every 12 hours 09/03/23 0655 09/04/23 0815   09/03/23 0400  metroNIDAZOLE  (FLAGYL ) IVPB 500 mg        500 mg 100 mL/hr over 60 Minutes Intravenous  Once 09/03/23 0358 09/03/23 0535   09/03/23 0330  vancomycin  (VANCOREADY) IVPB 1750 mg/350 mL        1,750 mg 175 mL/hr over 120 Minutes Intravenous  Once 09/03/23 0325 09/03/23 0608   09/03/23 0300  ceFEPIme  (MAXIPIME ) 2 g in sodium chloride  0.9 % 100 mL IVPB        2 g 200 mL/hr over 30 Minutes Intravenous  Once 09/03/23 0251 09/03/23 0336       Assessment/Plan: POD 17 - s/p ex lap, SBR, LOA, ileostomy revision, VHR at [facility] - Dr. Collene Dawson - continue IV Cefepime , Vanc - WBC still elevated -will plan to repeat ct today to reassess these collections, with wbc still up may just need to have aspirated/drained by IR - soft diet - monitor midline wound    FEN - soft diet VTE - SCD's, recommend  hep gtt instead of DOAC at this time for afib ID - cefepime , vanc Admit - [facility] service    ?possible pneumonia/pneumonitis  CHFpEF HLD Hx aortic dissection Obesity CKD IV afib    I reviewed hospitalist notes, last 24 h vitals and pain scores, last 48 h intake and output, last 24 h labs and trends, and last 24 h imaging results.   Enid Harry 09/06/2023

## 2023-09-06 NOTE — Progress Notes (Signed)
 PHARMACY - ANTICOAGULATION CONSULT NOTE  Pharmacy Consult for heparin  Indication: atrial fibrillation  Allergies  Allergen Reactions   Chlorthalidone  Other (See Comments)    ELEVATED KIDNEY FUNCTION     Patient Measurements: Height: 5\' 7"  (170.2 cm) Weight: 95 kg (209 lb 7 oz) IBW/kg (Calculated) : 61.6 HEPARIN  DW (KG): 84.8  Vital Signs: Temp: 98.6 F (37 C) (05/31 2100) Temp Source: Oral (05/31 2100) BP: 114/75 (05/31 2100) Pulse Rate: 94 (05/31 2100)  Labs: Recent Labs    09/03/23 0806 09/04/23 0321 09/04/23 1836 09/05/23 0438 09/06/23 0338  HGB 8.4* 7.8*  --  8.0* 8.4*  HCT 29.0* 26.8*  --  27.1* 28.1*  PLT 361 306  --  331 353  APTT  --   --  80* 70* 93*  HEPARINUNFRC  --   --  >1.10* 0.89* 0.41  CREATININE 1.90* 2.01*  --  2.17*  --   TROPONINIHS 29*  --   --   --   --     Estimated Creatinine Clearance: 31.8 mL/min (A) (by C-G formula based on SCr of 2.17 mg/dL (H)).   Medical History: 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HCC)    Anemia    Chronic kidney disease    Essential hypertension 05/07/2021   GERD (gastroesophageal reflux disease)    Hernia, epigastric    Hypertension    PAF (paroxysmal atrial fibrillation) (HCC) 05/07/2021   Pure hypercholesterolemia 05/07/2021    Medications:  Medications Prior to Admission  Medication Sig Dispense Refill Last Dose/Taking   acetaminophen  (TYLENOL ) 500 MG tablet Take 2 tablets (1,000 mg total) by mouth every 6 (six) hours.   09/02/2023 at  3:37 PM   Amino Acids -Protein Hydrolys (FEEDING SUPPLEMENT, PRO-STAT 64,) LIQD Take 30 mLs by mouth daily at 6 (six) AM.   09/02/2023 at  9:00 AM   amiodarone  (PACERONE ) 200 MG tablet 1 po BID thru 09/17/23 then 1 po daily   09/02/2023 at  8:00 PM   apixaban  (ELIQUIS ) 5 MG TABS tablet Take 1 tablet (5 mg total) by mouth 2 (two) times daily.   09/02/2023 at  8:00 PM   atorvastatin  (LIPITOR ) 80 MG tablet Take 1 tablet (80 mg total) by mouth daily. 90  tablet 3 09/02/2023 at  6:00 PM   ferrous sulfate  325 (65 FE) MG tablet Take 1 tablet (325 mg total) by mouth daily with breakfast. 30 tablet 0 09/02/2023 Evening   melatonin 3 MG TABS tablet Take 2 tablets (6 mg total) by mouth at bedtime as needed.   Unknown   metoprolol  tartrate (LOPRESSOR ) 50 MG tablet Take 1 tablet (50 mg total) by mouth 2 (two) times daily.   09/02/2023 at  8:00 PM   Multiple Vitamin (MULTIVITAMIN WITH MINERALS) TABS tablet Take 1 tablet by mouth daily.   09/02/2023 at  8:00 AM   [EXPIRED] oxyCODONE  (OXY IR/ROXICODONE ) 5 MG immediate release tablet Take 1-2 tablets (5-10 mg total) by mouth every 6 (six) hours as needed for up to 5 days for moderate pain (pain score 4-6) or severe pain (pain score 7-10). 30 tablet 0 08/31/2023   pantoprazole  (PROTONIX ) 40 MG tablet Take 1 tablet (40 mg total) by mouth daily.   09/02/2023 at  6:30 AM   polyethylene glycol (MIRALAX ) 17 g packet Take 17 g by mouth daily as needed for mild constipation.   Unknown   Scheduled:   atorvastatin   80 mg Oral Daily   Chlorhexidine  Gluconate Cloth  6  each Topical Daily   furosemide   60 mg Intravenous BID   metoprolol  tartrate  50 mg Oral BID   pantoprazole   40 mg Oral Daily    Assessment: 63 YO female presenting with shortness of breath and leaking ileostomy bag, sepsis suspected. Patient was recently admitted to [facility] (5/1-5/24) for ostomy placement secondary to SBO. CT abdomen shows intact midline wound without cellulitis or drainage--surgery suspects seroma/hematoma and not infectious. Patient has a history of afib on Eliquis  PTA (last dose 5/28 PM)--has been held this admission, per surgery. Per surgery, no surgical intervention at this time, but recommending initiation of heparin  in the meantime in case surgery is needed in the coming days.   Today, 09/06/23: Heparin  level = 0.41 (therapeutic)  aPTT is 93 sec, therapeutic with heparin  gtt @ 1250 units/hr As heparin  level & aPTT now correlate, will  monitor heparin  therapy with heparin  levels only going forward Hgb 8.4 (BL upper 7s-8s)--low, stable Plts 353--stable No s/sx of bleeding or line issues noted  Goal of Therapy:  Heparin  level 0.3-0.7 units/ml aPTT 66-102 sec Monitor platelets by anticoagulation protocol: Yes   Plan:  Continue heparin  infusion at 1250 units/hr Check daily heparin  level & CBC  Continue to monitor H&H and platelets   Agnes Hose, PharmD 09/06/2023 5:10 AM

## 2023-09-06 NOTE — Progress Notes (Signed)
 PROGRESS NOTE    Dorothy Harris  ZOX:[id number] DOB: February 22, 1961 DOA: 09/03/2023 PCP: [name], MD  Chief Complaint  Patient presents with   Shortness of Breath    Brief Narrative:    Dorothy Harris is Dorothy Harris 63 y.o. 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  Recently admitted to [facility] from 5/1-5/24 for high-grade SBO status post ex lap with lysis of adhesions and resection of small bowel with end ileostomy revision on 5/15.  Also treated for AKI on CKD stage IV due to volume depletion/dehydration treated with IV fluids and acute anemia treated with Dorothy Harris total of 3 units PRBCs.  Started on oral metoprolol  for Dorothy Harris and Eliquis  was resumed on discharge.  He was discharged to SNF.   Patient presents to the ED today from SNF for evaluation of leaking ileostomy bag.  Found to be septic with imaging notable for multiple abdominal fluid collections and volume overload.  Admitted for sepsis and concern for infection related to abdominal fluid collection.    Surgery has been consulted.    Assessment & Plan:   Principal Problem:   Acute on chronic heart failure with preserved ejection fraction (HFpEF) (HCC) Active Problems:   Acute hypoxemic respiratory failure (HCC)   PAF (paroxysmal atrial fibrillation) (HCC)   Essential hypertension   Stage 4 chronic kidney disease (HCC)   Ileus (HCC)   Edema of left upper extremity   QT prolongation   Sepsis (HCC)  Multiple abdominal fluid collections concerning (low suspicion for abscess, suspected seromas/hematomas) Ileus versus low-grade SBO Ostomy leakage History of recent high-grade SBO status post ex lap with lysis of adhesions and resection of small bowel with end ileostomy revision on 5/15  No lactic acidosis or  hypotension.    General surgery consulted, appreciate recs.  Mild cellulitis around incision, fluid collections suspected seromas/hematomas - no plan for surgery - surgery planning repeat CT today.  Antibiotics broadened. Trend WBC count and follow-up blood cultures.   Acute on chronic HFpEF Acute hypoxemic respiratory failure Pulmonary Edema Anasarca Volume Overload Grossly overloaded with bilateral LE edema.  CT abd/pelvis with ground glass opacities in R middle and lower lobes (suspect ground glass edema given her clinical picture) ~ 86 kg on 5/4.   103 kg on 5/29.  88.6 kg today.  Net negative 8.3 L. Will diurese as tolerated (low albumin , will give some albumin  as well) - hold second dose of lasix  today with bump in creatinine Echo 08/2023 with EF 60-65%, no RWMA.  Severe asymmetric LVH.  Diastolic function could not be evaluated.  Trivial MR.  Moderate calcification of AV.   Strict I/O, daily weights  Systemic Inflammatory Response Syndrome Afebrile, but leukocytosis, tachycardia, tachypnea at presentation SIRS physiology resolved  per discussion with surgery, low suspicion intraabdominal fluid collections are infected.   Another potential infectious source are ground glass opacities in lungs, but suspect these maybe more likely due to volume pulmonary edema?   Volume overload was contributing to tachycardia/tachypnea at presentation. Blood cultures NGx3  Ground-glass opacities in the right middle and lower lobes  Suspect due to volume overload rather than infectious cause Diuresis as above Negative covid, flu, RSV   Left upper extremity edema  Acute Superficial Vein Thrombosis of L Cephalic Vein Supportive care  Troponin elevation Mild elevation, she's without chest  pain.  EKG with artifact, but sinus (p waves difficult to see, but I think present) tach, isolated borderline ST elevation in lead III.  T wave inversions in I, aVL.  Similar to prior EKG. Low suspicion for ACS.   Troponin low and flat.   QT prolongation QTc 597 on EKG and patient is on amiodarone .  Potassium is within normal range.  Check magnesium  level and replace if low.  Avoid QT prolonging drugs  Qtc improved, will monitor intermittently    Paroxysmal Dorothy Harris Heparin  gtt for now.  Continue metoprolol .   Holding amiodarone  at this time due to QT prolongation.   Repeat EKG for QT eval -> improved, trend prn   AKI on CKD stage IV Creatinine 2.0, stable. Creatinine bumping with diuresis, follow with diuresis daily (she's still overloaded)   Hypertension Follow with diuresis, metop   Hyperlipidemia Continue Lipitor .   Anemia Treated with 3 units PRBCs during recent admission Hb fluctuating, will trend  Labs c/w iron def, aocd   GERD Continue Protonix .   Incidental stable AAA Patient will need outpatient follow-up with vascular surgery for follow-up CTA or MRA in 6 months      DVT prophylaxis: SCD Code Status: DNR Family Communication: none Disposition:   Status is: Inpatient Remains inpatient appropriate because: need for ongoing inpatient care   Consultants:  Surgery  Procedures:  none  Antimicrobials:  Anti-infectives (From admission, onward)    Start     Dose/Rate Route Frequency Ordered Stop   09/06/23 1030  piperacillin -tazobactam (ZOSYN ) IVPB 3.375 g        3.375 g 12.5 mL/hr over 240 Minutes Intravenous Every 8 hours 09/06/23 1013     09/04/23 1600  vancomycin  (VANCOCIN ) IVPB 1000 mg/200 mL premix  Status:  Discontinued        1,000 mg 200 mL/hr over 60 Minutes Intravenous Every 36 hours 09/03/23 0655 09/04/23 0815   09/04/23 1000  ceFAZolin  (ANCEF ) IVPB 2g/100 mL premix  Status:  Discontinued        2 g 200 mL/hr over 30 Minutes Intravenous Every 8 hours 09/04/23 0815 09/04/23 0901   09/04/23 1000  ceFAZolin  (ANCEF ) IVPB 2g/100 mL premix  Status:  Discontinued        2 g 200 mL/hr over 30 Minutes Intravenous Every 12 hours 09/04/23 0901 09/06/23 1004    09/03/23 1600  metroNIDAZOLE  (FLAGYL ) IVPB 500 mg  Status:  Discontinued        500 mg 100 mL/hr over 60 Minutes Intravenous Every 12 hours 09/03/23 0643 09/04/23 0815   09/03/23 1500  ceFEPIme  (MAXIPIME ) 2 g in sodium chloride  0.9 % 100 mL IVPB  Status:  Discontinued        2 g 200 mL/hr over 30 Minutes Intravenous Every 12 hours 09/03/23 0655 09/04/23 0815   09/03/23 0400  metroNIDAZOLE  (FLAGYL ) IVPB 500 mg        500 mg 100 mL/hr over 60 Minutes Intravenous  Once 09/03/23 0358 09/03/23 0535   09/03/23 0330  vancomycin  (VANCOREADY) IVPB 1750 mg/350 mL        1,750 mg 175 mL/hr over 120 Minutes Intravenous  Once 09/03/23 0325 09/03/23 0608   09/03/23 0300  ceFEPIme  (MAXIPIME ) 2 g in sodium chloride  0.9 % 100 mL IVPB        2 g 200 mL/hr over 30 Minutes Intravenous  Once 09/03/23 0251 09/03/23 0336       Subjective: No new complaints  Objective: Vitals:   09/05/23 1312 09/05/23 2100  09/06/23 0515 09/06/23 1040  BP: 127/87 114/75 118/83 (!) 170/114  Pulse: 91 94 (!) 110 (!) 107  Resp: 17     Temp:  98.6 F (37 C) 98.3 F (36.8 C)   TempSrc:  Oral Oral   SpO2: 100% 100% 100%   Weight:   88.6 kg   Height:        Intake/Output Summary (Last 24 hours) at 09/06/2023 1203 Last data filed at 09/06/2023 1041 Gross per 24 hour  Intake 480 ml  Output 1925 ml  Net -1445 ml   Filed Weights   09/04/23 0500 09/05/23 0500 09/06/23 0515  Weight: 96.4 kg 95 kg 88.6 kg    Examination:  General: No acute distress. Cardiovascular: RRR Lungs: unlabored Abdomen: Soft, nontender, nondistended Neurological: Alert and oriented 3. Moves all extremities 4 with equal strength. Cranial nerves II through XII grossly intact. Skin: Warm and dry. No rashes or lesions. Extremities: bilateral LE edema, edema to hips   Data Reviewed: I have personally reviewed following labs and imaging studies  CBC: Recent Labs  Lab 09/03/23 0149 09/03/23 0806 09/04/23 0321 09/05/23 0438 09/06/23 0338   WBC 15.4* 15.1* 12.6* 13.4* 12.9*  NEUTROABS 12.4*  --  9.6* 10.2*  --   HGB 9.1* 8.4* 7.8* 8.0* 8.4*  HCT 30.8* 29.0* 26.8* 27.1* 28.1*  MCV 102.7* 106.6* 105.5* 101.9* 101.4*  PLT 373 361 306 331 353    Basic Metabolic Panel: Recent Labs  Lab 09/03/23 0149 09/03/23 0806 09/04/23 0321 09/05/23 0438 09/06/23 0338  NA 138 140 140 137 136  K 4.4 4.3 4.3 3.7 3.7  CL 103 107 106 100 99  CO2 25 23 25 25 26   GLUCOSE 142* 104* 108* 108* 105*  BUN 20 20 21 21 23   CREATININE 2.06* 1.90* 2.01* 2.17* 2.45*  CALCIUM  8.2* 8.3* 8.2* 8.6* 8.6*  MG  --  2.1 1.9 1.7  --   PHOS  --   --  3.7 3.3  --     GFR: Estimated Creatinine Clearance: 27.2 mL/min (Gricelda Foland) (by C-G formula based on SCr of 2.45 mg/dL (H)).  Liver Function Tests: Recent Labs  Lab 09/03/23 0149 09/04/23 0321 09/05/23 0438 09/06/23 0338  AST 18 13* 11* 10*  ALT 18 14 9 6   ALKPHOS 107 71 70 69  BILITOT 1.1 1.3* 1.0 0.6  PROT 7.1 6.3* 6.7 6.9  ALBUMIN  2.3* 2.5* 3.0* 2.9*    CBG: No results for input(s): "GLUCAP" in the last 168 hours.    Recent Results (from the past 240 hours)  Resp panel by RT-PCR (RSV, Flu Montavis Schubring&B, Covid) Anterior Nasal Swab     Status: None   Collection Time: 09/03/23  1:49 AM   Specimen: Anterior Nasal Swab  Result Value Ref Range Status   SARS Coronavirus 2 by RT PCR NEGATIVE NEGATIVE Final    Comment: (NOTE) SARS-CoV-2 target nucleic acids are NOT DETECTED.  The SARS-CoV-2 RNA is generally detectable in upper respiratory specimens during the acute phase of infection. The lowest concentration of SARS-CoV-2 viral copies this assay can detect is 138 copies/mL. Loida Calamia negative result does not preclude SARS-Cov-2 infection and should not be used as the sole basis for treatment or other patient management decisions. Abdulahi Schor negative result may occur with  improper specimen collection/handling, submission of specimen other than nasopharyngeal swab, presence of viral mutation(s) within the areas targeted  by this assay, and inadequate number of viral copies(<138 copies/mL). Elijio Staples negative result must be combined with clinical observations, patient  history, and epidemiological information. The expected result is Negative.  Fact Sheet for Patients:  BloggerCourse.com  Fact Sheet for Healthcare Providers:  SeriousBroker.it  This test is no t yet approved or cleared by the [place]  FDA and  has been authorized for detection and/or diagnosis of SARS-CoV-2 by FDA under an Emergency Use Authorization (EUA). This EUA will remain  in effect (meaning this test can be used) for the duration of the COVID-19 declaration under Section 564(b)(1) of the Act, 21 U.S.C.section 360bbb-3(b)(1), unless the authorization is terminated  or revoked sooner.       Influenza Armonii Sieh by PCR NEGATIVE NEGATIVE Final   Influenza B by PCR NEGATIVE NEGATIVE Final    Comment: (NOTE) The Xpert Xpress SARS-CoV-2/FLU/RSV plus assay is intended as an aid in the diagnosis of influenza from Nasopharyngeal swab specimens and should not be used as Ailene Royal sole basis for treatment. Nasal washings and aspirates are unacceptable for Xpert Xpress SARS-CoV-2/FLU/RSV testing.  Fact Sheet for Patients: BloggerCourse.com  Fact Sheet for Healthcare Providers: SeriousBroker.it  This test is not yet approved or cleared by the [place]  FDA and has been authorized for detection and/or diagnosis of SARS-CoV-2 by FDA under an Emergency Use Authorization (EUA). This EUA will remain in effect (meaning this test can be used) for the duration of the COVID-19 declaration under Section 564(b)(1) of the Act, 21 U.S.C. section 360bbb-3(b)(1), unless the authorization is terminated or revoked.     Resp Syncytial Virus by PCR NEGATIVE NEGATIVE Final    Comment: (NOTE) Fact Sheet for Patients: BloggerCourse.com  Fact  Sheet for Healthcare Providers: SeriousBroker.it  This test is not yet approved or cleared by the [place]  FDA and has been authorized for detection and/or diagnosis of SARS-CoV-2 by FDA under an Emergency Use Authorization (EUA). This EUA will remain in effect (meaning this test can be used) for the duration of the COVID-19 declaration under Section 564(b)(1) of the Act, 21 U.S.C. section 360bbb-3(b)(1), unless the authorization is terminated or revoked.  Performed at [facility], [address], [place], [place] [address]   Blood culture (routine x 2)     Status: None (Preliminary result)   Collection Time: 09/03/23  1:49 AM   Specimen: BLOOD  Result Value Ref Range Status   Specimen Description   Final    BLOOD RIGHT ANTECUBITAL Performed at [facility], [address], [place], [place] [address]    Special Requests   Final    BOTTLES DRAWN AEROBIC AND ANAEROBIC Blood Culture adequate volume Performed at [facility], [address], [place], [place] [address]    Culture   Final    NO GROWTH 3 DAYS Performed at [facility], [address] [place], [place] [address]    Report Status PENDING  Incomplete  Blood culture (routine x 2)     Status: None (Preliminary result)   Collection Time: 09/03/23  3:00 AM   Specimen: BLOOD  Result Value Ref Range Status   Specimen Description   Final    BLOOD BLOOD RIGHT WRIST Performed at [facility], [address], [place], [place] [address]    Special Requests   Final    BOTTLES DRAWN AEROBIC AND ANAEROBIC Blood Culture adequate volume Performed at [facility], [address], [place], [place] [address]    Culture   Final    NO GROWTH 3 DAYS Performed at [facility], [address], [place], [place] [address]    Report Status PENDING  Incomplete  MRSA Next Gen by PCR, Nasal     Status:  None   Collection Time: 09/03/23  4:37 AM   Specimen: Nasal Mucosa; Nasal Swab  Result Value Ref Range Status   MRSA by PCR Next Gen NOT DETECTED NOT DETECTED Final    Comment: (NOTE) The GeneXpert MRSA Assay (FDA approved for NASAL specimens only), is one component of Tiajuana Leppanen comprehensive MRSA colonization surveillance program. It is not intended to diagnose MRSA infection nor to guide or monitor treatment for MRSA infections. Test performance is not FDA approved in patients less than 86 years old. Performed at [facility], [address], [place], [place] [address]          Radiology Studies: No results found.       Scheduled Meds:  atorvastatin   80 mg Oral Daily   Chlorhexidine  Gluconate Cloth  6 each Topical Daily   furosemide   60 mg Intravenous BID   iohexol   500 mL Oral Q1H   metoprolol  tartrate  50 mg Oral BID   pantoprazole   40 mg Oral Daily   Continuous Infusions:  heparin  1,250 Units/hr (09/06/23 0333)   piperacillin -tazobactam (ZOSYN )  IV 3.375 g (09/06/23 1056)     LOS: 3 days    Time spent: over 30 min    Donnetta Gains, MD [facility]   To contact the attending provider between 7A-7P or the covering provider during after hours 7P-7A, please log into the web site [email or web address] and access using universal [place] password for that web site. If you do not have the password, please call the hospital operator.  09/06/2023, 12:03 PM

## 2023-09-06 NOTE — Plan of Care (Signed)
   Problem: Coping: Goal: Level of anxiety will decrease Outcome: Progressing   Problem: Pain Managment: Goal: General experience of comfort will improve and/or be controlled Outcome: Progressing   Problem: Safety: Goal: Ability to remain free from injury will improve Outcome: Progressing

## 2023-09-07 ENCOUNTER — Inpatient Hospital Stay (HOSPITAL_COMMUNITY)

## 2023-09-07 DIAGNOSIS — I5033 Acute on chronic diastolic (congestive) heart failure: Secondary | ICD-10-CM | POA: Diagnosis not present

## 2023-09-07 LAB — CBC
HCT: 30.2 % — ABNORMAL LOW (ref 36.0–46.0)
Hemoglobin: 8.9 g/dL — ABNORMAL LOW (ref 12.0–15.0)
MCH: 30.6 pg (ref 26.0–34.0)
MCHC: 29.5 g/dL — ABNORMAL LOW (ref 30.0–36.0)
MCV: 103.8 fL — ABNORMAL HIGH (ref 80.0–100.0)
Platelets: 351 10*3/uL (ref 150–400)
RBC: 2.91 MIL/uL — ABNORMAL LOW (ref 3.87–5.11)
RDW: 15.6 % — ABNORMAL HIGH (ref 11.5–15.5)
WBC: 11.7 10*3/uL — ABNORMAL HIGH (ref 4.0–10.5)
nRBC: 0 % (ref 0.0–0.2)

## 2023-09-07 LAB — COMPREHENSIVE METABOLIC PANEL WITH GFR
ALT: <5 U/L (ref 0–44)
AST: 9 U/L — ABNORMAL LOW (ref 15–41)
Albumin: 3.1 g/dL — ABNORMAL LOW (ref 3.5–5.0)
Alkaline Phosphatase: 65 U/L (ref 38–126)
Anion gap: 14 (ref 5–15)
BUN: 26 mg/dL — ABNORMAL HIGH (ref 8–23)
CO2: 23 mmol/L (ref 22–32)
Calcium: 9 mg/dL (ref 8.9–10.3)
Chloride: 98 mmol/L (ref 98–111)
Creatinine, Ser: 2.92 mg/dL — ABNORMAL HIGH (ref 0.44–1.00)
GFR, Estimated: 18 mL/min — ABNORMAL LOW (ref >60)
Glucose, Bld: 98 mg/dL (ref 70–99)
Potassium: 3.9 mmol/L (ref 3.5–5.1)
Sodium: 135 mmol/L (ref 135–145)
Total Bilirubin: 1.1 mg/dL (ref 0.0–1.2)
Total Protein: 6.9 g/dL (ref 6.5–8.1)

## 2023-09-07 LAB — HEPARIN LEVEL (UNFRACTIONATED)
Heparin Unfractionated: 0.24 [IU]/mL — ABNORMAL LOW (ref 0.30–0.70)
Heparin Unfractionated: 0.36 [IU]/mL (ref 0.30–0.70)

## 2023-09-07 LAB — MAGNESIUM: Magnesium: 1.7 mg/dL (ref 1.7–2.4)

## 2023-09-07 LAB — BRAIN NATRIURETIC PEPTIDE: B Natriuretic Peptide: 496.9 pg/mL — ABNORMAL HIGH (ref 0.0–100.0)

## 2023-09-07 LAB — PHOSPHORUS: Phosphorus: 2.2 mg/dL — ABNORMAL LOW (ref 2.5–4.6)

## 2023-09-07 MED ORDER — LIDOCAINE HCL 1 % IJ SOLN
INTRAMUSCULAR | Status: AC
  Filled 2023-09-07: qty 20

## 2023-09-07 MED ORDER — ALBUMIN HUMAN 25 % IV SOLN
25.0000 g | Freq: Once | INTRAVENOUS | Status: AC
  Administered 2023-09-07: 25 g via INTRAVENOUS
  Filled 2023-09-07: qty 100

## 2023-09-07 NOTE — Progress Notes (Signed)
 Subjective/Chief Complaint: Patient remains comfortable w/o comfortable without complaints of pain.  Ms. Wanamaker remains in no acute distress at this time.  Patient is tolerating diet without nausea or vomiting.  Ileostomy bag has output.  Objective: Vital signs in last 24 hours: Temp:  [97.6 F (36.4 C)-98.7 F (37.1 C)] 98.7 F (37.1 C) (06/02 0529) Pulse Rate:  [85-107] 107 (06/02 0529) Resp:  [14-20] 14 (06/02 0529) BP: (131-170)/(79-114) 131/79 (06/02 0529) SpO2:  [95 %-100 %] 95 % (06/02 0529) Weight:  [88 kg] 88 kg (06/02 0500) Last BM Date : 09/06/23  Intake/Output from previous day: 06/01 0701 - 06/02 0700 In: 922 [P.O.:240; I.V.:632; IV Piggyback:50] Out: 1950 [Stool:1950] Intake/Output this shift: No intake/output data recorded.  Abd soft and approp tender, ileostomy bag in place, wound w/o infection.  Lab Results:  Recent Labs    09/06/23 0338 09/07/23 0347  WBC 12.9* 11.7*  HGB 8.4* 8.9*  HCT 28.1* 30.2*  PLT 353 351   BMET Recent Labs    09/06/23 0338 09/07/23 0347  NA 136 135  K 3.7 3.9  CL 99 98  CO2 26 23  GLUCOSE 105* 98  BUN 23 26*  CREATININE 2.45* 2.92*  CALCIUM  8.6* 9.0   PT/INR No results for input(s): "LABPROT", "INR" in the last 72 hours. ABG No results for input(s): "PHART", "HCO3" in the last 72 hours.  Invalid input(s): "PCO2", "PO2"  Studies/Results: CT ABDOMEN PELVIS WO CONTRAST Result Date: 09/06/2023 CLINICAL DATA:  Postoperative abdominal pain with shortness of breath. Lysis of adhesions on 08/20/2023 with ventral hernia repair. EXAM: CT ABDOMEN AND PELVIS WITHOUT CONTRAST TECHNIQUE: Multidetector CT imaging of the abdomen and pelvis was performed following the standard protocol without IV contrast. RADIATION DOSE REDUCTION: This exam was performed according to the departmental dose-optimization program which includes automated exposure control, adjustment of the mA and/or kV according to patient size and/or use of iterative  reconstruction technique. COMPARISON:  CT abdomen and pelvis 09/03/2023 FINDINGS: Lower chest: There is atelectasis in the bilateral lower lobes. There are trace bilateral pleural effusions which have decreased. Thoracic aortic graft/stent again seen measuring up to 5 cm. Hepatobiliary: Gallstones are present. There is no biliary ductal dilatation. Liver is within normal limits. Pancreas: Unremarkable. No pancreatic ductal dilatation or surrounding inflammatory changes. Spleen: Normal in size without focal abnormality. Adrenals/Urinary Tract: Adrenal glands are unremarkable. Kidneys are normal, without renal calculi, focal lesion, or hydronephrosis. Bladder is unremarkable. Stomach/Bowel: Right lower quadrant ileostomy is again seen. There is wall thickening and inflammation of small bowel loops leading up to the ostomy which is new from prior. There is no evidence for bowel obstruction. The stomach is nondilated. Diffuse mesenteric edema and interloop fluid has increased compared to the prior study. There is no free air or pneumatosis. Vascular/Lymphatic: Infrarenal abdominal aortic aneurysm measuring 5.2 cm appears unchanged. There are atherosclerotic calcifications of the aorta. No enlarged lymph nodes are seen. Reproductive: Uterus and adnexa are within normal limits. Other: Trace free fluid in the pelvis is unchanged. Focal area of fat attenuation with scattered areas of internal fluid in the anterior right lower quadrant measures 5.9 x 15 cm and appears unchanged. There is no focal abdominal wall hernia. Subcutaneous fluid collection in the right anterior abdominal wall measures 1.4 x 8.4 by 1.9 cm and has minimally decreased in size. Anterior abdominal wall hematomas measuring up to 3.8 cm have not significantly changed. Body wall edema and skin thickening at the level of the hips appear unchanged.  Musculoskeletal: No acute osseous findings. IMPRESSION: 1. New wall thickening and inflammation of small bowel  loops leading up to the right lower quadrant ileostomy. Findings are compatible with nonspecific enteritis including infectious, inflammatory and ischemic etiologies. 2. Increasing mesenteric edema and interloop fluid. No free air. 3. Stable trace free fluid in the pelvis. 4. Stable focal fat attenuation with scattered areas of internal fluid in the anterior right lower quadrant. Fat necrosis is a possibility. 5. Stable anterior abdominal wall hematomas. 6. Stable infrarenal abdominal aortic aneurysm measuring 5.2 cm. Recommend follow-up every 6 months and vascular consultation. 7. Decreased trace bilateral pleural effusions. 8. Stable body wall edema and skin thickening at the level of the hips. 9. Aortic atherosclerosis. Aortic Atherosclerosis (ICD10-I70.0). Electronically Signed   By: Tyron Gallon M.D.   On: 09/06/2023 17:22    Anti-infectives: Anti-infectives (From admission, onward)    Start     Dose/Rate Route Frequency Ordered Stop   09/06/23 1030  piperacillin -tazobactam (ZOSYN ) IVPB 3.375 g        3.375 g 12.5 mL/hr over 240 Minutes Intravenous Every 8 hours 09/06/23 1013     09/04/23 1600  vancomycin  (VANCOCIN ) IVPB 1000 mg/200 mL premix  Status:  Discontinued        1,000 mg 200 mL/hr over 60 Minutes Intravenous Every 36 hours 09/03/23 0655 09/04/23 0815   09/04/23 1000  ceFAZolin  (ANCEF ) IVPB 2g/100 mL premix  Status:  Discontinued        2 g 200 mL/hr over 30 Minutes Intravenous Every 8 hours 09/04/23 0815 09/04/23 0901   09/04/23 1000  ceFAZolin  (ANCEF ) IVPB 2g/100 mL premix  Status:  Discontinued        2 g 200 mL/hr over 30 Minutes Intravenous Every 12 hours 09/04/23 0901 09/06/23 1004   09/03/23 1600  metroNIDAZOLE  (FLAGYL ) IVPB 500 mg  Status:  Discontinued        500 mg 100 mL/hr over 60 Minutes Intravenous Every 12 hours 09/03/23 0643 09/04/23 0815   09/03/23 1500  ceFEPIme  (MAXIPIME ) 2 g in sodium chloride  0.9 % 100 mL IVPB  Status:  Discontinued        2 g 200 mL/hr  over 30 Minutes Intravenous Every 12 hours 09/03/23 0655 09/04/23 0815   09/03/23 0400  metroNIDAZOLE  (FLAGYL ) IVPB 500 mg        500 mg 100 mL/hr over 60 Minutes Intravenous  Once 09/03/23 0358 09/03/23 0535   09/03/23 0330  vancomycin  (VANCOREADY) IVPB 1750 mg/350 mL        1,750 mg 175 mL/hr over 120 Minutes Intravenous  Once 09/03/23 0325 09/03/23 0608   09/03/23 0300  ceFEPIme  (MAXIPIME ) 2 g in sodium chloride  0.9 % 100 mL IVPB        2 g 200 mL/hr over 30 Minutes Intravenous  Once 09/03/23 0251 09/03/23 0336       Assessment/Plan: POD 18 - s/p ex lap, SBR, LOA, ileostomy revision, VHR at [facility] - Dr. Collene Dawson - continue IV Zosyn  - WBC still elevated -Repeat CT done yesterday (6/1) to reassess these collections- CT shows increasing mesenteric edema and interloop fluid. No free air.  With wbc still up, IR consulted to aspirate/drain. - Continue on soft diet - Continue to monitor midline wound    FEN - soft diet VTE - SCD's, recommend hep gtt instead of DOAC at this time for afib ID - Zosyn   Admit - TRH service    ?possible pneumonia/pneumonitis  CHFpEF HLD Hx aortic dissection Obesity CKD IV  afib    I reviewed nursing notes, hospitalist notes, last 24 h vitals and pain scores, last 48 h intake and output, last 24 h labs and trends, and last 24 h imaging results.   Mirta Ammon, PA-C 09/07/2023

## 2023-09-07 NOTE — Progress Notes (Addendum)
 PROGRESS NOTE    Dorothy Harris  NWG:[id number] DOB: 12-07-1960 DOA: 09/03/2023 PCP: [name], MD  Chief Complaint  Patient presents with   Shortness of Breath    Brief Narrative:    Dorothy Harris is Dorothy Harris 63 y.o. 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  Recently admitted to [facility] from 5/1-5/24 for high-grade SBO status post ex lap with lysis of adhesions and resection of small bowel with end ileostomy revision on 5/15.  Also treated for AKI on CKD stage IV due to volume depletion/dehydration treated with IV fluids and acute anemia treated with Dorothy Harris total of 3 units PRBCs.  Started on oral metoprolol  for Dorothy Klute-fib and Eliquis  was resumed on discharge.  He was discharged to SNF.   Patient presents to the ED today from SNF for evaluation of leaking ileostomy bag.  Found to be septic with imaging notable for multiple abdominal fluid collections and volume overload.  Admitted for sepsis and concern for infection related to abdominal fluid collection.    Surgery has been consulted.    Assessment & Plan:   Principal Problem:   Acute on chronic heart failure with preserved ejection fraction (HFpEF) (HCC) Active Problems:   Acute hypoxemic respiratory failure (HCC)   PAF (paroxysmal atrial fibrillation) (HCC)   Essential hypertension   Stage 4 chronic kidney disease (HCC)   Ileus (HCC)   Edema of left upper extremity   QT prolongation   Sepsis (HCC)  Multiple abdominal fluid collections concerning (low suspicion for abscess, suspected seromas/hematomas) Ileus versus low-grade SBO Ostomy leakage History of recent high-grade SBO status post ex lap with lysis of adhesions and resection of small bowel with end ileostomy revision on 5/15  No lactic acidosis or  hypotension.    General surgery consulted, appreciate recs.  Mild cellulitis around incision, fluid collections suspected seromas/hematomas - no plan for surgery  repeat CT 6/1 with new wall thickening and inflammation of small bowel loops leading up to the right lower quadrant ileostomy (c/w nonspecific enteritis).  Increasing mesenteric edema and interloop fluid.  Trace free fluid in the pelvis.  Stable focal fat attenuation with scattered areas of internal fluid in the RLQ.  Stable anterior abdominal wall hematomas.  Antibiotics broadened.  IR c/s to aspirate/drain.  Follow GI pathogen panel  Trend WBC count and follow-up blood cultures.   Acute on chronic HFpEF Acute hypoxemic respiratory failure Pulmonary Edema Anasarca Volume Overload Grossly overloaded with bilateral LE edema.  CT abd/pelvis with ground glass opacities in R middle and lower lobes (suspect ground glass edema given her clinical picture) ~ 86 kg on 5/4.   103 kg on 5/29.  88 kg today.  Net negative 9.3 L. Will diurese as tolerated (low albumin , will give some albumin  as well) - hold lasix  today with bump in creatinine Echo 08/2023 with EF 60-65%, no RWMA.  Severe asymmetric LVH.  Diastolic function could not be evaluated.  Trivial MR.  Moderate calcification of AV.   Strict I/O, daily weights  Systemic Inflammatory Response Syndrome Afebrile, but leukocytosis, tachycardia, tachypnea at presentation SIRS physiology resolved  per discussion with surgery, low suspicion intraabdominal fluid collections are infected.   Another potential infectious source are ground glass opacities in lungs, but suspect these maybe more likely due to volume pulmonary edema?   Volume overload  was contributing to tachycardia/tachypnea at presentation. Blood cultures NGx3  Ground-glass opacities in the right middle and lower lobes  Suspect due to volume overload rather than infectious cause Diuresis as above Negative covid, flu, RSV    Diarrhea Significant output from ileostomy CT with findings concerning for nonspecific enteritis Will follow GI path panel If persistent, follow C diff  Left upper extremity edema  Acute Superficial Vein Thrombosis of L Cephalic Vein Supportive care  Troponin elevation Mild elevation, she's without chest pain.  EKG with artifact, but sinus (p waves difficult to see, but I think present) tach, isolated borderline ST elevation in lead III.  T wave inversions in I, aVL.  Similar to prior EKG. Low suspicion for ACS.  Troponin low and flat.   QT prolongation QTc 597 on EKG and patient is on amiodarone .  Potassium is within normal range.  Check magnesium  level and replace if low.  Avoid QT prolonging drugs  Qtc improved, will monitor intermittently    Paroxysmal Dorothy Harris Heparin  gtt for now.  Continue metoprolol .   Holding amiodarone  at this time due to QT prolongation.   Repeat EKG for QT eval -> improved, trend prn   AKI on CKD stage IV Creatinine 2.0, stable. Creatinine bumping with diuresis, hold diuresis - monitor and reevaluate need to continue   Hypertension Follow with diuresis, metop   Hyperlipidemia Continue Lipitor .   Anemia Treated with 3 units PRBCs during recent admission Hb fluctuating, will trend  Labs c/w iron def, aocd   GERD Continue Protonix .   Incidental stable AAA Patient will need outpatient follow-up with vascular surgery for follow-up CTA or MRA in 6 months      DVT prophylaxis: SCD Code Status: DNR Family Communication: none Disposition:   Status is: Inpatient Remains inpatient appropriate because: need for ongoing inpatient care   Consultants:  Surgery  Procedures:  none  Antimicrobials:  Anti-infectives (From admission, onward)    Start     Dose/Rate Route Frequency Ordered Stop   09/06/23 1030  piperacillin -tazobactam (ZOSYN ) IVPB 3.375 g        3.375 g 12.5 mL/hr over 240 Minutes Intravenous Every 8 hours 09/06/23 1013      09/04/23 1600  vancomycin  (VANCOCIN ) IVPB 1000 mg/200 mL premix  Status:  Discontinued        1,000 mg 200 mL/hr over 60 Minutes Intravenous Every 36 hours 09/03/23 0655 09/04/23 0815   09/04/23 1000  ceFAZolin  (ANCEF ) IVPB 2g/100 mL premix  Status:  Discontinued        2 g 200 mL/hr over 30 Minutes Intravenous Every 8 hours 09/04/23 0815 09/04/23 0901   09/04/23 1000  ceFAZolin  (ANCEF ) IVPB 2g/100 mL premix  Status:  Discontinued        2 g 200 mL/hr over 30 Minutes Intravenous Every 12 hours 09/04/23 0901 09/06/23 1004   09/03/23 1600  metroNIDAZOLE  (FLAGYL ) IVPB 500 mg  Status:  Discontinued        500 mg 100 mL/hr over 60 Minutes Intravenous Every 12 hours 09/03/23 0643 09/04/23 0815   09/03/23 1500  ceFEPIme  (MAXIPIME ) 2 g in sodium chloride  0.9 % 100 mL IVPB  Status:  Discontinued        2 g 200 mL/hr over 30 Minutes Intravenous Every 12 hours 09/03/23 0655 09/04/23 0815   09/03/23 0400  metroNIDAZOLE  (FLAGYL ) IVPB 500 mg        500 mg 100 mL/hr over 60 Minutes Intravenous  Once 09/03/23 0358 09/03/23 0535   09/03/23  0330  vancomycin  (VANCOREADY) IVPB 1750 mg/350 mL        1,750 mg 175 mL/hr over 120 Minutes Intravenous  Once 09/03/23 0325 09/03/23 0608   09/03/23 0300  ceFEPIme  (MAXIPIME ) 2 g in sodium chloride  0.9 % 100 mL IVPB        2 g 200 mL/hr over 30 Minutes Intravenous  Once 09/03/23 0251 09/03/23 0336       Subjective: No complaints  Objective: Vitals:   09/06/23 1939 09/07/23 0500 09/07/23 0529 09/07/23 1334  BP: (!) 146/79  131/79 (!) 135/94  Pulse: 100  (!) 107 97  Resp: 15  14 18   Temp: 98.5 F (36.9 C)  98.7 F (37.1 C) 98.9 F (37.2 C)  TempSrc:    Oral  SpO2: 95%  95% 98%  Weight:  88 kg    Height:        Intake/Output Summary (Last 24 hours) at 09/07/2023 1353 Last data filed at 09/07/2023 0208 Gross per 24 hour  Intake 802.01 ml  Output 1850 ml  Net -1047.99 ml   Filed Weights   09/05/23 0500 09/06/23 0515 09/07/23 0500  Weight: 95 kg 88.6  kg 88 kg    Examination:  General: No acute distress. Cardiovascular: RRR Lungs: unlabored Abdomen: ileostomy, midline incision Neurological: Alert and oriented 3. Moves all extremities 4 with equal strength. Cranial nerves II through XII grossly intact. Extremities: LE and dependent edema present, but improving   Data Reviewed: I have personally reviewed following labs and imaging studies  CBC: Recent Labs  Lab 09/03/23 0149 09/03/23 0806 09/04/23 0321 09/05/23 0438 09/06/23 0338 09/07/23 0347  WBC 15.4* 15.1* 12.6* 13.4* 12.9* 11.7*  NEUTROABS 12.4*  --  9.6* 10.2*  --   --   HGB 9.1* 8.4* 7.8* 8.0* 8.4* 8.9*  HCT 30.8* 29.0* 26.8* 27.1* 28.1* 30.2*  MCV 102.7* 106.6* 105.5* 101.9* 101.4* 103.8*  PLT 373 361 306 331 353 351    Basic Metabolic Panel: Recent Labs  Lab 09/03/23 0806 09/04/23 0321 09/05/23 0438 09/06/23 0338 09/07/23 0347  NA 140 140 137 136 135  K 4.3 4.3 3.7 3.7 3.9  CL 107 106 100 99 98  CO2 23 25 25 26 23   GLUCOSE 104* 108* 108* 105* 98  BUN 20 21 21 23  26*  CREATININE 1.90* 2.01* 2.17* 2.45* 2.92*  CALCIUM  8.3* 8.2* 8.6* 8.6* 9.0  MG 2.1 1.9 1.7  --  1.7  PHOS  --  3.7 3.3  --  2.2*    GFR: Estimated Creatinine Clearance: 22.8 mL/min (Vidhi Delellis) (by C-G formula based on SCr of 2.92 mg/dL (H)).  Liver Function Tests: Recent Labs  Lab 09/03/23 0149 09/04/23 0321 09/05/23 0438 09/06/23 0338 09/07/23 0347  AST 18 13* 11* 10* 9*  ALT 18 14 9 6  <5  ALKPHOS 107 71 70 69 65  BILITOT 1.1 1.3* 1.0 0.6 1.1  PROT 7.1 6.3* 6.7 6.9 6.9  ALBUMIN  2.3* 2.5* 3.0* 2.9* 3.1*    CBG: No results for input(s): "GLUCAP" in the last 168 hours.    Recent Results (from the past 240 hours)  Resp panel by RT-PCR (RSV, Flu Veto Macqueen&B, Covid) Anterior Nasal Swab     Status: None   Collection Time: 09/03/23  1:49 AM   Specimen: Anterior Nasal Swab  Result Value Ref Range Status   SARS Coronavirus 2 by RT PCR NEGATIVE NEGATIVE Final    Comment:  (NOTE) SARS-CoV-2 target nucleic acids are NOT DETECTED.  The SARS-CoV-2 RNA is  generally detectable in upper respiratory specimens during the acute phase of infection. The lowest concentration of SARS-CoV-2 viral copies this assay can detect is 138 copies/mL. Dorothy Harris negative result does not preclude SARS-Cov-2 infection and should not be used as the sole basis for treatment or other patient management decisions. Dorothy Harris negative result may occur with  improper specimen collection/handling, submission of specimen other than nasopharyngeal swab, presence of viral mutation(s) within the areas targeted by this assay, and inadequate number of viral copies(<138 copies/mL). Jasie Meleski negative result must be combined with clinical observations, patient history, and epidemiological information. The expected result is Negative.  Fact Sheet for Patients:  BloggerCourse.com  Fact Sheet for Healthcare Providers:  SeriousBroker.it  This test is no t yet approved or cleared by the [place]  FDA and  has been authorized for detection and/or diagnosis of SARS-CoV-2 by FDA under an Emergency Use Authorization (EUA). This EUA will remain  in effect (meaning this test can be used) for the duration of the COVID-19 declaration under Section 564(b)(1) of the Act, 21 U.S.C.section 360bbb-3(b)(1), unless the authorization is terminated  or revoked sooner.       Influenza Dorothy Harris by PCR NEGATIVE NEGATIVE Final   Influenza B by PCR NEGATIVE NEGATIVE Final    Comment: (NOTE) The Xpert Xpress SARS-CoV-2/FLU/RSV plus assay is intended as an aid in the diagnosis of influenza from Nasopharyngeal swab specimens and should not be used as Dorothy Harris sole basis for treatment. Nasal washings and aspirates are unacceptable for Xpert Xpress SARS-CoV-2/FLU/RSV testing.  Fact Sheet for Patients: BloggerCourse.com  Fact Sheet for Healthcare  Providers: SeriousBroker.it  This test is not yet approved or cleared by the [place]  FDA and has been authorized for detection and/or diagnosis of SARS-CoV-2 by FDA under an Emergency Use Authorization (EUA). This EUA will remain in effect (meaning this test can be used) for the duration of the COVID-19 declaration under Section 564(b)(1) of the Act, 21 U.S.C. section 360bbb-3(b)(1), unless the authorization is terminated or revoked.     Resp Syncytial Virus by PCR NEGATIVE NEGATIVE Final    Comment: (NOTE) Fact Sheet for Patients: BloggerCourse.com  Fact Sheet for Healthcare Providers: SeriousBroker.it  This test is not yet approved or cleared by the [place]  FDA and has been authorized for detection and/or diagnosis of SARS-CoV-2 by FDA under an Emergency Use Authorization (EUA). This EUA will remain in effect (meaning this test can be used) for the duration of the COVID-19 declaration under Section 564(b)(1) of the Act, 21 U.S.C. section 360bbb-3(b)(1), unless the authorization is terminated or revoked.  Performed at [facility], [address] [place], [place] [address]   Blood culture (routine x 2)     Status: None (Preliminary result)   Collection Time: 09/03/23  1:49 AM   Specimen: BLOOD  Result Value Ref Range Status   Specimen Description   Final    BLOOD RIGHT ANTECUBITAL Performed at [facility], [address], [place], [place] [address]    Special Requests   Final    BOTTLES DRAWN AEROBIC AND ANAEROBIC Blood Culture adequate volume Performed at [facility] [address] [place], [place] [address]    Culture   Final    NO GROWTH 4 DAYS Performed at [facility], [address], [place], [place] [address]    Report Status PENDING  Incomplete  Blood culture (routine x 2)     Status: None (Preliminary  result)   Collection Time: 09/03/23  3:00 AM   Specimen: BLOOD  Result Value Ref Range Status   Specimen Description   Final    BLOOD BLOOD RIGHT WRIST Performed at [facility], [address], [place], [place] [address]    Special Requests   Final    BOTTLES DRAWN AEROBIC AND ANAEROBIC Blood Culture adequate volume Performed at [facility], [address], [place], [place] [address]    Culture   Final    NO GROWTH 4 DAYS Performed at [facility], [address], [place], [place] [address]    Report Status PENDING  Incomplete  MRSA Next Gen by PCR, Nasal     Status: None   Collection Time: 09/03/23  4:37 AM   Specimen: Nasal Mucosa; Nasal Swab  Result Value Ref Range Status   MRSA by PCR Next Gen NOT DETECTED NOT DETECTED Final    Comment: (NOTE) The GeneXpert MRSA Assay (FDA approved for NASAL specimens only), is one component of Dorothy Harris comprehensive MRSA colonization surveillance program. It is not intended to diagnose MRSA infection nor to guide or monitor treatment for MRSA infections. Test performance is not FDA approved in patients less than 64 years old. Performed at [facility], [address] [place], [place] [address]          Radiology Studies: CT ABDOMEN PELVIS WO CONTRAST Result Date: 09/06/2023 CLINICAL DATA:  Postoperative abdominal pain with shortness of breath. Lysis of adhesions on 08/20/2023 with ventral hernia repair. EXAM: CT ABDOMEN AND PELVIS WITHOUT CONTRAST TECHNIQUE: Multidetector CT imaging of the abdomen and pelvis was performed following the standard protocol without IV contrast. RADIATION DOSE REDUCTION: This exam was performed according to the departmental dose-optimization program which includes automated exposure control, adjustment of the mA and/or kV according to patient size and/or use of iterative reconstruction technique. COMPARISON:  CT abdomen and pelvis 09/03/2023 FINDINGS:  Lower chest: There is atelectasis in the bilateral lower lobes. There are trace bilateral pleural effusions which have decreased. Thoracic aortic graft/stent again seen measuring up to 5 cm. Hepatobiliary: Gallstones are present. There is no biliary ductal dilatation. Liver is within normal limits. Pancreas: Unremarkable. No pancreatic ductal dilatation or surrounding inflammatory changes. Spleen: Normal in size without focal abnormality. Adrenals/Urinary Tract: Adrenal glands are unremarkable. Kidneys are normal, without renal calculi, focal lesion, or hydronephrosis. Bladder is unremarkable. Stomach/Bowel: Right lower quadrant ileostomy is again seen. There is wall thickening and inflammation of small bowel loops leading up to the ostomy which is new from prior. There is no evidence for bowel obstruction. The stomach is nondilated. Diffuse mesenteric edema and interloop fluid has increased compared to the prior study. There is no free air or pneumatosis. Vascular/Lymphatic: Infrarenal abdominal aortic aneurysm measuring 5.2 cm appears unchanged. There are atherosclerotic calcifications of the aorta. No enlarged lymph nodes are seen. Reproductive: Uterus and adnexa are within normal limits. Other: Trace free fluid in the pelvis is unchanged. Focal area of fat attenuation with scattered areas of internal fluid in the anterior right lower quadrant measures 5.9 x 15 cm and appears unchanged. There is no focal abdominal wall hernia. Subcutaneous fluid collection in the right anterior abdominal wall measures 1.4 x 8.4 by 1.9 cm and has minimally decreased in size. Anterior abdominal wall hematomas measuring up to 3.8 cm have not significantly changed. Body wall edema and skin thickening at the level of the hips appear unchanged. Musculoskeletal: No acute osseous findings. IMPRESSION: 1. New wall thickening and inflammation of  small bowel loops leading up to the right lower quadrant ileostomy. Findings are compatible  with nonspecific enteritis including infectious, inflammatory and ischemic etiologies. 2. Increasing mesenteric edema and interloop fluid. No free air. 3. Stable trace free fluid in the pelvis. 4. Stable focal fat attenuation with scattered areas of internal fluid in the anterior right lower quadrant. Fat necrosis is Yosselin Zoeller possibility. 5. Stable anterior abdominal wall hematomas. 6. Stable infrarenal abdominal aortic aneurysm measuring 5.2 cm. Recommend follow-up every 6 months and vascular consultation. 7. Decreased trace bilateral pleural effusions. 8. Stable body wall edema and skin thickening at the level of the hips. 9. Aortic atherosclerosis. Aortic Atherosclerosis (ICD10-I70.0). Electronically Signed   By: Tyron Gallon M.D.   On: 09/06/2023 17:22         Scheduled Meds:  atorvastatin   80 mg Oral Daily   Chlorhexidine  Gluconate Cloth  6 each Topical Daily   metoprolol  tartrate  50 mg Oral BID   pantoprazole   40 mg Oral Daily   Continuous Infusions:  heparin  1,400 Units/hr (09/07/23 0930)   piperacillin -tazobactam (ZOSYN )  IV 3.375 g (09/07/23 0930)     LOS: 4 days    Time spent: over 30 min    Donnetta Gains, MD [facility]   To contact the attending provider between 7A-7P or the covering provider during after hours 7P-7A, please log into the web site [email or web address] and access using universal [place] password for that web site. If you do not have the password, please call the hospital operator.  09/07/2023, 1:53 PM

## 2023-09-07 NOTE — Procedures (Signed)
 Interventional Radiology Procedure Note  Procedure: Ultrasound guided aspiration of abdominal wall fluid collection  Findings: Please refer to procedural dictation for full description. Approximately 3 mL serosanguinous fluid aspirated from right anterior abdominal wall fluid collection just inferior to stoma.  Complications: none immediate  Estimated Blood Loss: < 5 mL  Recommendations: Follow culture.   Creasie Doctor, MD

## 2023-09-07 NOTE — Progress Notes (Signed)
 PHARMACY - ANTICOAGULATION CONSULT NOTE  Pharmacy Consult for Heparin  Indication: atrial fibrillation  Allergies  Allergen Reactions   Chlorthalidone  Other (See Comments)    ELEVATED KIDNEY FUNCTION     Patient Measurements: Height: 5\' 7"  (170.2 cm) Weight: 88 kg (194 lb 0.1 oz) IBW/kg (Calculated) : 61.6 HEPARIN  DW (KG): 84.8  Vital Signs: Temp: 98.7 F (37.1 C) (06/02 0529) BP: 131/79 (06/02 0529) Pulse Rate: 107 (06/02 0529)  Labs: Recent Labs    09/04/23 1836 09/04/23 1836 09/05/23 0438 09/06/23 0338 09/07/23 0347  HGB  --    < > 8.0* 8.4* 8.9*  HCT  --   --  27.1* 28.1* 30.2*  PLT  --   --  331 353 351  APTT 80*  --  70* 93*  --   HEPARINUNFRC >1.10*  --  0.89* 0.41 0.24*  CREATININE  --   --  2.17* 2.45* 2.92*   < > = values in this interval not displayed.    Estimated Creatinine Clearance: 22.8 mL/min (A) (by C-G formula based on SCr of 2.92 mg/dL (H)).   Medical History: 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HCC)    Anemia    Chronic kidney disease    Essential hypertension 05/07/2021   GERD (gastroesophageal reflux disease)    Hernia, epigastric    Hypertension    PAF (paroxysmal atrial fibrillation) (HCC) 05/07/2021   Pure hypercholesterolemia 05/07/2021    Assessment: AC/Heme: hep gtt for afib,  doppler negative - Takes Eliquis  PTA for afib (LD 5/28 PM) - Acute Superficial Vein Thrombosis of L Cephalic Vein : supportive care only - Iron panel low - Hgb 8.9 up, Plts WNL - HL 0.24 low  Goal of Therapy:  Heparin  level 0.3-0.7 units/ml Monitor platelets by anticoagulation protocol: Yes   Plan:  - Increase IV heparin  1250>>1400 units/hr. Recheck heparin  level in 8 hrs. - Heparin  level and CBC daily   Shann Lewellyn Darcel Early, PharmD, BCPS Clinical Staff Pharmacist Enis Harsh Stillinger 09/07/2023,8:10 AM

## 2023-09-07 NOTE — Plan of Care (Signed)
  Problem: Pain Managment: Goal: General experience of comfort will improve and/or be controlled Outcome: Progressing   Problem: Safety: Goal: Ability to remain free from injury will improve Outcome: Progressing

## 2023-09-07 NOTE — Progress Notes (Signed)
 Pharmacy: [facility]    Patient is a 63 y.o F hx afib on Eliquis  PTA who presented to the ED on 09/03/23 for evaluation of leaking colostomy bag. PTA Eliquis  is currently being held in case invasive intervention is needed and he was started on heparin  drip on 09/04/23.   - 6/2: Ultrasound guided aspiration of abdominal wall fluid collection   - heparin  level collected at 5:11 pm is therapeutic at 0.36  Goal of Therapy:  Heparin  level 0.3-0.7 units/ml Monitor platelets by anticoagulation protocol: Yes  Plan: - continue heparin  drip at 1400 units/hr - f/u with AM labs and adjust as needed  Sharlyn Deaner, PharmD, BCPS 09/07/2023 5:39 PM

## 2023-09-07 NOTE — Progress Notes (Signed)
 Physical Therapy Treatment Patient Details Name: ALAIYAH BOLLMAN MRN: [id number] DOB: 12-14-1960 Today's Date: 09/07/2023   History of Present Illness GUILIANA SHOR is a 63 y.o. 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  Recently admitted to [facility] from 5/1-5/24 for high-grade SBO status post ex lap with lysis of adhesions and resection of small bowel with end ileostomy revision on 5/15.  Also treated for AKI on CKD stage IV due to volume depletion/dehydration treated with IV fluids and acute anemia treated with a total of 3 units PRBCs.  Started on oral metoprolol  for A-fib and Eliquis  was resumed on discharge and was discharged to SNF. ED presentation with leaking iliostomy bag. Re-admitted to [facility] on 09/03/23 with dx CHF, ARF, pulmonary edema.    PT Comments  Pt making excellent progress with transfers today and able to take a couple of steps with RW and assist.  Benefits from assist of 2 for OOB for safety and chair follow.  Continue POC with recommendation Patient will benefit from continued inpatient follow up therapy, <3 hours/day at d/c.     If plan is discharge home, recommend the following: A lot of help with bathing/dressing/bathroom;A lot of help with walking and/or transfers;Help with stairs or ramp for entrance;Assistance with cooking/housework   Can travel by private vehicle        Equipment Recommendations  None recommended by PT    Recommendations for Other Services       Precautions / Restrictions Precautions Precautions: Fall Precaution/Restrictions Comments: ostomy     Mobility  Bed Mobility Overal bed mobility: Needs Assistance Bed Mobility: Supine to Sit     Supine to sit: Contact guard, Used rails, HOB elevated          Transfers Overall transfer level:  Needs assistance Equipment used: Rolling walker (2 wheels) Transfers: Sit to/from Stand Sit to Stand: Mod assist, +2 physical assistance, From elevated surface           General transfer comment: INcreased time, cues for hand placement, walker stabilized    Ambulation/Gait Ambulation/Gait assistance: Min assist, +2 physical assistance Gait Distance (Feet): 5 Feet Assistive device: Rolling walker (2 wheels) Gait Pattern/deviations: Step-to pattern, Decreased stride length, Shuffle Gait velocity: decreased     General Gait Details: small steps to chair with min A of 2 for safety , assist for RW, heavy dependency on RW   Stairs             Wheelchair Mobility     Tilt Bed    Modified Rankin (Stroke Patients Only)       Balance Overall balance assessment: Needs assistance Sitting-balance support: No upper extremity supported Sitting balance-Leahy Scale: Good     Standing balance support: Bilateral upper extremity supported, Reliant on assistive device for balance Standing balance-Leahy Scale: Poor Standing balance comment: RW and min A                            Communication    Cognition Arousal: Alert Behavior During Therapy: WFL for tasks assessed/performed   PT - Cognitive impairments: No apparent impairments                                Cueing  Exercises Total Joint Exercises Ankle Circles/Pumps: AROM, Both, 10 reps, Seated Quad Sets: AROM, Both, 10 reps, Seated General Exercises - Lower Extremity Hip Flexion/Marching: AROM, Left, AAROM, Right, 10 reps, Seated Other Exercises Other Exercises: 10x1 LAQ AROM; 10x1 graded resistance LAQ and knee flex Other Exercises: hip add pillow squeeze x10 Other Exercises: gluteal squeeze x 10    General Comments General comments (skin integrity, edema, etc.): HR up to 118 bpm      Pertinent Vitals/Pain Pain Assessment Pain Assessment: Faces Faces Pain Scale: Hurts a little  bit Pain Location: R LE Pain Descriptors / Indicators: Discomfort Pain Intervention(s): Limited activity within patient's tolerance, Monitored during session, Premedicated before session, Repositioned    Home Living                          Prior Function            PT Goals (current goals can now be found in the care plan section) Progress towards PT goals: Progressing toward goals    Frequency    Min 2X/week      PT Plan      Co-evaluation              AM-PAC PT "6 Clicks" Mobility   Outcome Measure  Help needed turning from your back to your side while in a flat bed without using bedrails?: A Little Help needed moving from lying on your back to sitting on the side of a flat bed without using bedrails?: A Little Help needed moving to and from a bed to a chair (including a wheelchair)?: Total Help needed standing up from a chair using your arms (e.g., wheelchair or bedside chair)?: Total Help needed to walk in hospital room?: Total Help needed climbing 3-5 steps with a railing? : Total 6 Click Score: 10    End of Session Equipment Utilized During Treatment: Gait belt Activity Tolerance: Patient tolerated treatment well Patient left: with call bell/phone within reach;in chair (no alarm box in room ; follows commands) Nurse Communication: Mobility status (IV leaking) PT Visit Diagnosis: Unsteadiness on feet (R26.81);Other abnormalities of gait and mobility (R26.89);Muscle weakness (generalized) (M62.81);Pain Pain - Right/Left: Right Pain - part of body: Leg     Time: 1143-1207 PT Time Calculation (min) (ACUTE ONLY): 24 min  Charges:    $Therapeutic Exercise: 8-22 mins $Therapeutic Activity: 8-22 mins PT General Charges $$ ACUTE PT VISIT: 1 Visit                     Cyd Dowse, PT Acute Rehab [facility] [phone]    Carolynn Citrin 09/07/2023, 12:16 PM

## 2023-09-07 NOTE — TOC Progression Note (Signed)
 Transition of Care [facility]) - Progression Note   Patient Details  Name: Dorothy Harris MRN: [id number] Date of Birth: 12-12-1960  [facility]) CM/SW Contact  Zenon Hilda, LCSW Phone Number: 09/07/2023, 12:37 PM  Clinical Narrative: CSW followed up with Crystal in admissions at [facility] regarding medical readiness. Per [employer], she will start the insurance authorization today after an updated PT note is in as patient's insurance takes a few days to get approval for rehab. CSW updated PT and new PT note is in. TOC to follow.  Expected Discharge Plan: Skilled Nursing Facility Barriers to Discharge: Continued Medical Work up  Expected Discharge Plan and Services In-house Referral: Clinical Social Work Post Acute Care Choice: NA Living arrangements for the past 2 months: Skilled Nursing Facility [name])            DME Arranged: N/A DME Agency: NA  Social Determinants of Health (SDOH) Interventions SDOH Screenings   Food Insecurity: Food Insecurity Present (09/03/2023)  Housing: High Risk (09/03/2023)  Transportation Needs: No Transportation Needs (09/03/2023)  Utilities: Not At Risk (09/03/2023)  Alcohol Screen: Low Risk  (05/07/2021)  Depression (PHQ2-9): Medium Risk (05/05/2023)  Financial Resource Strain: Low Risk  (05/07/2021)  Physical Activity: Inactive (05/07/2021)  Tobacco Use: High Risk (09/05/2023)   Readmission Risk Interventions    09/03/2023   10:08 AM 08/11/2023    8:27 AM 08/07/2023    7:29 AM  Readmission Risk Prevention Plan  Medication Screening   Complete  Transportation Screening Complete Complete Complete  PCP or Specialist Appt within 5-7 Days Complete    Home Care Screening Complete    Medication Review (RN CM) Complete    HRI or Home Care Consult  Complete   Social Work Consult for Recovery Care Planning/Counseling  Complete   Palliative Care Screening  Not Applicable   Medication Review [employer])  Complete

## 2023-09-08 ENCOUNTER — Encounter: Admitting: General Surgery

## 2023-09-08 DIAGNOSIS — I5033 Acute on chronic diastolic (congestive) heart failure: Secondary | ICD-10-CM | POA: Diagnosis not present

## 2023-09-08 LAB — CBC
HCT: 28.5 % — ABNORMAL LOW (ref 36.0–46.0)
Hemoglobin: 8.7 g/dL — ABNORMAL LOW (ref 12.0–15.0)
MCH: 30.4 pg (ref 26.0–34.0)
MCHC: 30.5 g/dL (ref 30.0–36.0)
MCV: 99.7 fL (ref 80.0–100.0)
Platelets: 337 10*3/uL (ref 150–400)
RBC: 2.86 MIL/uL — ABNORMAL LOW (ref 3.87–5.11)
RDW: 15.5 % (ref 11.5–15.5)
WBC: 8.8 10*3/uL (ref 4.0–10.5)
nRBC: 0 % (ref 0.0–0.2)

## 2023-09-08 LAB — CULTURE, BLOOD (ROUTINE X 2)
Culture: NO GROWTH
Culture: NO GROWTH
Special Requests: ADEQUATE
Special Requests: ADEQUATE

## 2023-09-08 LAB — GASTROINTESTINAL PANEL BY PCR, STOOL (REPLACES STOOL CULTURE)

## 2023-09-08 LAB — COMPREHENSIVE METABOLIC PANEL WITH GFR
ALT: 6 U/L (ref 0–44)
AST: 12 U/L — ABNORMAL LOW (ref 15–41)
Albumin: 3.2 g/dL — ABNORMAL LOW (ref 3.5–5.0)
Alkaline Phosphatase: 66 U/L (ref 38–126)
Anion gap: 10 (ref 5–15)
BUN: 31 mg/dL — ABNORMAL HIGH (ref 8–23)
CO2: 24 mmol/L (ref 22–32)
Calcium: 8.6 mg/dL — ABNORMAL LOW (ref 8.9–10.3)
Chloride: 99 mmol/L (ref 98–111)
Creatinine, Ser: 2.51 mg/dL — ABNORMAL HIGH (ref 0.44–1.00)
GFR, Estimated: 21 mL/min — ABNORMAL LOW (ref >60)
Glucose, Bld: 110 mg/dL — ABNORMAL HIGH (ref 70–99)
Potassium: 3.6 mmol/L (ref 3.5–5.1)
Sodium: 133 mmol/L — ABNORMAL LOW (ref 135–145)
Total Bilirubin: 1.2 mg/dL (ref 0.0–1.2)
Total Protein: 6.9 g/dL (ref 6.5–8.1)

## 2023-09-08 LAB — HEPARIN LEVEL (UNFRACTIONATED): Heparin Unfractionated: 0.31 [IU]/mL (ref 0.30–0.70)

## 2023-09-08 LAB — MAGNESIUM: Magnesium: 1.7 mg/dL (ref 1.7–2.4)

## 2023-09-08 LAB — PHOSPHORUS: Phosphorus: 2.9 mg/dL (ref 2.5–4.6)

## 2023-09-08 MED ORDER — AMIODARONE HCL 200 MG PO TABS
200.0000 mg | ORAL_TABLET | Freq: Two times a day (BID) | ORAL | Status: DC
  Administered 2023-09-08 – 2023-09-17 (×19): 200 mg via ORAL
  Filled 2023-09-08 (×19): qty 1

## 2023-09-08 MED ORDER — DICLOFENAC SODIUM 1 % EX GEL: 2.0000 g | Freq: Four times a day (QID) | CUTANEOUS | Status: DC

## 2023-09-08 MED ORDER — MELATONIN 5 MG PO TABS
5.0000 mg | ORAL_TABLET | Freq: Every evening | ORAL | Status: AC | PRN
  Administered 2023-09-08 – 2023-09-10 (×4): 5 mg via ORAL
  Filled 2023-09-08 (×4): qty 1

## 2023-09-08 NOTE — Plan of Care (Signed)

## 2023-09-08 NOTE — Progress Notes (Signed)
 Occupational Therapy Treatment Patient Details Name: Dorothy Harris MRN: [id number] DOB: Nov 30, 1960 Today's Date: 09/08/2023   History of present illness Dorothy Harris is a 63 y.o. 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  Recently admitted to [facility] from 5/1-5/24 for high-grade SBO status post ex lap with lysis of adhesions and resection of small bowel with end ileostomy revision on 5/15.  Also treated for AKI on CKD stage IV due to volume depletion/dehydration treated with IV fluids and acute anemia treated with a total of 3 units PRBCs.  Started on oral metoprolol  for A-fib and Eliquis  was resumed on discharge and was discharged to SNF. ED presentation with leaking iliostomy bag. Re-admitted to [facility] on 09/03/23 with dx CHF, ARF, pulmonary edema.   OT comments  Patient seen for skilled OT session this afternoon. Patient requested to speak with TOC upon OT arrival and secure chat completed to pass along message. Patient open to all therapy presented with STS and therex from EOB along with light ADL's. SEE current status outlined below. OT will continue to follow acutely to progress functional performance. Recommendation for continued inpatient follow up therapy, <3 hours/day upon discharge remains appropriate.        If plan is discharge home, recommend the following:  Two people to help with walking and/or transfers;Two people to help with bathing/dressing/bathroom;Assistance with cooking/housework;Direct supervision/assist for medications management;Direct supervision/assist for financial management;Assist for transportation;Help with stairs or ramp for entrance   Equipment Recommendations  None recommended by OT       Precautions / Restrictions Precautions Precautions: Fall Recall of  Precautions/Restrictions: Intact Precaution/Restrictions Comments: ostomy Restrictions Weight Bearing Restrictions Per Provider Order: No       Mobility Bed Mobility Overal bed mobility: Needs Assistance Bed Mobility: Supine to Sit, Sit to Supine     Supine to sit: Contact guard, HOB elevated, Used rails Sit to supine: Min assist, HOB elevated, Used rails   General bed mobility comments: improved LE management out and into bed    Transfers Overall transfer level: Needs assistance Equipment used: Rolling walker (2 wheels) Transfers: Sit to/from Stand Sit to Stand: Mod assist, +2 physical assistance, From elevated surface           General transfer comment: patient declined recliner transfer due to abdominal staples bing removed shortly patient resported     Balance Overall balance assessment: Needs assistance Sitting-balance support: No upper extremity supported Sitting balance-Leahy Scale: Good     Standing balance support: Bilateral upper extremity supported, Reliant on assistive device for balance Standing balance-Leahy Scale: Poor Standing balance comment: R LE risk for buckling at knee                           ADL either performed or assessed with clinical judgement   ADL Overall ADL's : Needs assistance/impaired     Grooming: Wash/dry hands;Wash/dry face;Oral care;Modified independent;Sitting   Upper Body Bathing: Set up;Sitting                           Functional mobility during ADLs: Moderate assistance;Rolling walker (2 wheels) (STS x 4 from elevated bed surface) General ADL Comments: significant increase in functional reach with seated figure 4 techniques     Communication Communication Communication: No apparent difficulties  Cognition Arousal: Alert Behavior During Therapy: WFL for tasks assessed/performed Cognition: No apparent impairments                               Following commands: Intact         Cueing   Cueing Techniques: Verbal cues  Exercises Exercises: Other exercises (seated push ups x 5 reps 3 sets and lateral scoots x 5)       General Comments significant reduction in B LE and L UE edema this visit, SpO2 98% and HR 102    Pertinent Vitals/ Pain       Pain Assessment Pain Assessment: Faces Faces Pain Scale: Hurts a little bit Pain Location:  (R knee) Pain Descriptors / Indicators: Dull Pain Intervention(s): Monitored during session, Premedicated before session, Repositioned   Frequency  Min 2X/week        Progress Toward Goals  OT Goals(current goals can now be found in the care plan section)  Progress towards OT goals: Progressing toward goals  Acute Rehab OT Goals Patient Stated Goal: to walk OT Goal Formulation: With patient Time For Goal Achievement: 09/18/23 Potential to Achieve Goals: Good ADL Goals Pt Will Perform Upper Body Bathing: with set-up;sitting Pt Will Perform Lower Body Bathing: with mod assist;sit to/from stand Pt Will Perform Upper Body Dressing: with set-up;sitting Pt Will Perform Lower Body Dressing: with mod assist;sit to/from stand Pt Will Transfer to Toilet: with mod assist;with +2 assist;bedside commode Pt/caregiver will Perform Home Exercise Program: Increased strength;Both right and left upper extremity;With written HEP provided;Independently  Plan         AM-PAC OT "6 Clicks" Daily Activity     Outcome Measure   Help from another person eating meals?: None Help from another person taking care of personal grooming?: A Little Help from another person toileting, which includes using toliet, bedpan, or urinal?: A Lot Help from another person bathing (including washing, rinsing, drying)?: A Lot Help from another person to put on and taking off regular upper body clothing?: A Little Help from another person to put on and taking off regular lower body clothing?: A Lot 6 Click Score: 16    End of Session Equipment Utilized  During Treatment: Gait belt;Rolling walker (2 wheels);Oxygen  OT Visit Diagnosis: Unsteadiness on feet (R26.81);Muscle weakness (generalized) (M62.81);Pain Pain - Right/Left: Right Pain - part of body: Knee   Activity Tolerance Patient limited by fatigue   Patient Left in bed;with call bell/phone within reach;with bed alarm set;with nursing/sitter in room   Nurse Communication Mobility status;Need for lift equipment;Other (comment) (may benefit from STEDY use by nursing to allow more OOB time)        Time: 1308-6578 OT Time Calculation (min): 40 min  Charges: OT General Charges $OT Visit: 1 Visit OT Treatments $Self Care/Home Management : 8-22 mins $Therapeutic Activity: 23-37 mins  Marranda Arakelian OT/L Acute Rehabilitation Department  [phone]  09/08/2023, 4:33 PM

## 2023-09-08 NOTE — Progress Notes (Signed)
 [facility]  Patient inpatient and missing appointment today to get staples removed. Admitted with SOB in the setting of CHF, diarrhea, fluid collections s/p IR drainage with SS output.   She is s/p Exploratory laparotomy, lysis of adhesions, reduction of ventral hernia, resection of small bowel, end ileostomy revision and primary hernia repair on 08/20/23.   I have sent the team a message about someone removing the staples in the midline and lateral to the ostomy site (will be under the wafer).   I am happy to follow up with the patient as needed as outpatient.  Appreciate [facility] taking care of surgical issues while inpatient at [facility].  Deena Farrier, MD [facility] [address]low [place], [place] [address] [phone] (office)

## 2023-09-08 NOTE — Progress Notes (Signed)
 PROGRESS NOTE    ZAMAYA RAPAPORT  ZOX:[id number] DOB: 1960-08-21 DOA: 09/03/2023 PCP: [name], MD  Chief Complaint  Patient presents with   Shortness of Breath    Brief Narrative:    Dorothy Harris is Tameika Heckmann 63 y.o. 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  Recently admitted to [facility] from 5/1-5/24 for high-grade SBO status post ex lap with lysis of adhesions and resection of small bowel with end ileostomy revision on 5/15.  Also treated for AKI on CKD stage IV due to volume depletion/dehydration treated with IV fluids and acute anemia treated with Franchon Ketterman total of 3 units PRBCs.  Started on oral metoprolol  for Santhiago Collingsworth-fib and Eliquis  was resumed on discharge.  He was discharged to SNF.   Patient presents to the ED on the day of admission from SNF for evaluation of leaking ileostomy bag.  Concern for sepsis on admission with imaging notable for multiple abdominal fluid collections and volume overload.  Admitted for sepsis and concern for infection related to abdominal fluid collection.    Surgery initially did not think fluid collections infected.  IR consulted for aspiration on 6/2.  Surgery still following.    Assessment & Plan:   Principal Problem:   Acute on chronic heart failure with preserved ejection fraction (HFpEF) (HCC) Active Problems:   Acute hypoxemic respiratory failure (HCC)   PAF (paroxysmal atrial fibrillation) (HCC)   Essential hypertension   Stage 4 chronic kidney disease (HCC)   Ileus (HCC)   Edema of left upper extremity   QT prolongation   Sepsis (HCC)  Multiple abdominal fluid collections concerning for abscess vs seromas/hematomas Ileus versus low-grade SBO Ostomy leakage History of recent high-grade SBO status post ex lap with lysis  of adhesions and resection of small bowel with end ileostomy revision on 5/15  No lactic acidosis or hypotension.    General surgery consulted, appreciate recs.  Mild cellulitis around incision, fluid collections suspected seromas/hematomas - no plan for surgery  repeat CT 6/1 with new wall thickening and inflammation of small bowel loops leading up to the right lower quadrant ileostomy (c/w nonspecific enteritis).  Increasing mesenteric edema and interloop fluid.  Trace free fluid in the pelvis.  Stable focal fat attenuation with scattered areas of internal fluid in the RLQ.  Stable anterior abdominal wall hematomas.   Staples out today per Dr. Collene Dawson Antibiotics broadened by surgery.   S/p US  guided R abdominal wall fluid collection aspiration (gram stain without organism or WBC, culture pending) Follow GI pathogen panel - negative Trend WBC count and follow-up blood cultures.   Acute on chronic HFpEF Acute hypoxemic respiratory failure Pulmonary Edema Anasarca Volume Overload At admission overloaded with bilateral LE edema.  CT abd/pelvis with ground glass opacities in R middle and lower lobes (suspect ground glass edema given her clinical picture) ~ 86 kg on 5/4.   103 kg on 5/29.  84 kg today.  Net negative 10.5 L. Diuresed with albumin , will now hold lasix  today with bump in creatinine Echo 08/2023 with EF 60-65%, no RWMA.  Severe asymmetric LVH.  Diastolic function could not be evaluated.  Trivial MR.  Moderate calcification of AV.   Strict I/O, daily weights  Systemic Inflammatory Response Syndrome Afebrile, but leukocytosis, tachycardia, tachypnea at presentation SIRS physiology resolved  per initial discussion with  surgery, low suspicion intraabdominal fluid collections are infected.   Another potential infectious source are ground glass opacities in lungs, but suspect these maybe more likely due to volume pulmonary edema?   Volume overload was contributing to  tachycardia/tachypnea at presentation. Aspiration by IR as noted above Blood cultures NGx3  Ground-glass opacities in the right middle and lower lobes  Suspect due to volume overload rather than infectious cause Diuresis as above Negative covid, flu, RSV   Diarrhea Significant output from ileostomy CT with findings concerning for nonspecific enteritis Will follow GI path panel - negative If persistent, consider sending C diff (will hold off for now as ileostomy output down)  Left upper extremity edema  Acute Superficial Vein Thrombosis of L Cephalic Vein Supportive care  Troponin elevation Mild elevation, she's without chest pain.  EKG with artifact, but sinus (p waves difficult to see, but I think present) tach, isolated borderline ST elevation in lead III.  T wave inversions in I, aVL.  Similar to prior EKG. Low suspicion for ACS.  Troponin low and flat.   QT prolongation QTc 597 on EKG and patient is on amiodarone .  Potassium is within normal range.  Check magnesium  level and replace if low.  Caution with QT prolonging drugs  Qtc improved, will monitor intermittently    Paroxysmal Omare Bilotta-fib Heparin  gtt for now.  Continue metoprolol .   Amiodarone  initially on hold, will resume with improved qtc Repeat EKG for QT eval -> improved, trend prn   AKI on CKD stage IV Creatinine 2.0, stable. Creatinine bumped to 2.92 with diuresis, hold diuresis for now, her edema is improved  monitor and reevaluate    Hypertension Follow with diuresis, metop   Hyperlipidemia Continue Lipitor .   Anemia Treated with 3 units PRBCs during recent admission Hb fluctuating, will trend  Labs c/w iron def, aocd   GERD Continue Protonix .   Incidental stable AAA Patient will need outpatient follow-up with vascular surgery for follow-up CTA or MRA in 6 months      DVT prophylaxis: SCD Code Status: DNR Family Communication: none Disposition:   Status is: Inpatient Remains inpatient  appropriate because: need for ongoing inpatient care   Consultants:  Surgery  Procedures:  Technically successful ultrasound-guided right abdominal wall fluid collection aspiration. Antimicrobials:  Anti-infectives (From admission, onward)    Start     Dose/Rate Route Frequency Ordered Stop   09/06/23 1030  piperacillin -tazobactam (ZOSYN ) IVPB 3.375 g        3.375 g 12.5 mL/hr over 240 Minutes Intravenous Every 8 hours 09/06/23 1013     09/04/23 1600  vancomycin  (VANCOCIN ) IVPB 1000 mg/200 mL premix  Status:  Discontinued        1,000 mg 200 mL/hr over 60 Minutes Intravenous Every 36 hours 09/03/23 0655 09/04/23 0815   09/04/23 1000  ceFAZolin  (ANCEF ) IVPB 2g/100 mL premix  Status:  Discontinued        2 g 200 mL/hr over 30 Minutes Intravenous Every 8 hours 09/04/23 0815 09/04/23 0901   09/04/23 1000  ceFAZolin  (ANCEF ) IVPB 2g/100 mL premix  Status:  Discontinued        2 g 200 mL/hr over 30 Minutes Intravenous Every 12 hours 09/04/23 0901 09/06/23 1004   09/03/23 1600  metroNIDAZOLE  (FLAGYL ) IVPB 500 mg  Status:  Discontinued        500 mg 100 mL/hr over 60 Minutes Intravenous Every 12 hours 09/03/23 0643 09/04/23 0815   09/03/23 1500  ceFEPIme  (MAXIPIME ) 2 g in sodium chloride   0.9 % 100 mL IVPB  Status:  Discontinued        2 g 200 mL/hr over 30 Minutes Intravenous Every 12 hours 09/03/23 0655 09/04/23 0815   09/03/23 0400  metroNIDAZOLE  (FLAGYL ) IVPB 500 mg        500 mg 100 mL/hr over 60 Minutes Intravenous  Once 09/03/23 0358 09/03/23 0535   09/03/23 0330  vancomycin  (VANCOREADY) IVPB 1750 mg/350 mL        1,750 mg 175 mL/hr over 120 Minutes Intravenous  Once 09/03/23 0325 09/03/23 0608   09/03/23 0300  ceFEPIme  (MAXIPIME ) 2 g in sodium chloride  0.9 % 100 mL IVPB        2 g 200 mL/hr over 30 Minutes Intravenous  Once 09/03/23 0251 09/03/23 0336       Subjective: No new complaints  Objective: Vitals:   09/07/23 2127 09/08/23 0500 09/08/23 0634 09/08/23 1259  BP:  133/78  126/80 120/67  Pulse: (!) 110  (!) 109 (!) 107  Resp: 20   17  Temp: 99 F (37.2 C)  98.5 F (36.9 C) 98.9 F (37.2 C)  TempSrc: Oral  Oral Oral  SpO2: 96%  97% 97%  Weight:  84.1 kg    Height:        Intake/Output Summary (Last 24 hours) at 09/08/2023 1352 Last data filed at 09/08/2023 1313 Gross per 24 hour  Intake 240 ml  Output 1445 ml  Net -1205 ml   Filed Weights   09/06/23 0515 09/07/23 0500 09/08/23 0500  Weight: 88.6 kg 88 kg 84.1 kg    Examination:  General: No acute distress. Cardiovascular: RRR Lungs: unlabored Abdomen: ileostomy, midline incision with staples Neurological: Alert and oriented 3. Moves all extremities 4 with equal strength. Cranial nerves II through XII grossly intact. Extremities: dependent edema, improved  Data Reviewed: I have personally reviewed following labs and imaging studies  CBC: Recent Labs  Lab 09/03/23 0149 09/03/23 0806 09/04/23 0321 09/05/23 0438 09/06/23 0338 09/07/23 0347 09/08/23 0409  WBC 15.4*   < > 12.6* 13.4* 12.9* 11.7* 8.8  NEUTROABS 12.4*  --  9.6* 10.2*  --   --   --   HGB 9.1*   < > 7.8* 8.0* 8.4* 8.9* 8.7*  HCT 30.8*   < > 26.8* 27.1* 28.1* 30.2* 28.5*  MCV 102.7*   < > 105.5* 101.9* 101.4* 103.8* 99.7  PLT 373   < > 306 331 353 351 337   < > = values in this interval not displayed.    Basic Metabolic Panel: Recent Labs  Lab 09/03/23 0806 09/04/23 0321 09/05/23 0438 09/06/23 0338 09/07/23 0347 09/08/23 0409  NA 140 140 137 136 135 133*  K 4.3 4.3 3.7 3.7 3.9 3.6  CL 107 106 100 99 98 99  CO2 23 25 25 26 23 24   GLUCOSE 104* 108* 108* 105* 98 110*  BUN 20 21 21 23  26* 31*  CREATININE 1.90* 2.01* 2.17* 2.45* 2.92* 2.51*  CALCIUM  8.3* 8.2* 8.6* 8.6* 9.0 8.6*  MG 2.1 1.9 1.7  --  1.7 1.7  PHOS  --  3.7 3.3  --  2.2* 2.9    GFR: Estimated Creatinine Clearance: 25.9 mL/min (Earnestine Shipp) (by C-G formula based on SCr of 2.51 mg/dL (H)).  Liver Function Tests: Recent Labs  Lab 09/04/23 0321  09/05/23 0438 09/06/23 0338 09/07/23 0347 09/08/23 0409  AST 13* 11* 10* 9* 12*  ALT 14 9 6  <5 6  ALKPHOS 71 70 69 65 66  BILITOT 1.3* 1.0 0.6 1.1 1.2  PROT 6.3* 6.7 6.9 6.9 6.9  ALBUMIN  2.5* 3.0* 2.9* 3.1* 3.2*    CBG: No results for input(s): "GLUCAP" in the last 168 hours.    Recent Results (from the past 240 hours)  Resp panel by RT-PCR (RSV, Flu Izella Ybanez&B, Covid) Anterior Nasal Swab     Status: None   Collection Time: 09/03/23  1:49 AM   Specimen: Anterior Nasal Swab  Result Value Ref Range Status   SARS Coronavirus 2 by RT PCR NEGATIVE NEGATIVE Final    Comment: (NOTE) SARS-CoV-2 target nucleic acids are NOT DETECTED.  The SARS-CoV-2 RNA is generally detectable in upper respiratory specimens during the acute phase of infection. The lowest concentration of SARS-CoV-2 viral copies this assay can detect is 138 copies/mL. Datra Clary negative result does not preclude SARS-Cov-2 infection and should not be used as the sole basis for treatment or other patient management decisions. Verenise Moulin negative result may occur with  improper specimen collection/handling, submission of specimen other than nasopharyngeal swab, presence of viral mutation(s) within the areas targeted by this assay, and inadequate number of viral copies(<138 copies/mL). Elizandro Laura negative result must be combined with clinical observations, patient history, and epidemiological information. The expected result is Negative.  Fact Sheet for Patients:  BloggerCourse.com  Fact Sheet for Healthcare Providers:  SeriousBroker.it  This test is no t yet approved or cleared by the [place]  FDA and  has been authorized for detection and/or diagnosis of SARS-CoV-2 by FDA under an Emergency Use Authorization (EUA). This EUA will remain  in effect (meaning this test can be used) for the duration of the COVID-19 declaration under Section 564(b)(1) of the Act, 21 U.S.C.section 360bbb-3(b)(1),  unless the authorization is terminated  or revoked sooner.       Influenza Aarik Blank by PCR NEGATIVE NEGATIVE Final   Influenza B by PCR NEGATIVE NEGATIVE Final    Comment: (NOTE) The Xpert Xpress SARS-CoV-2/FLU/RSV plus assay is intended as an aid in the diagnosis of influenza from Nasopharyngeal swab specimens and should not be used as Nuri Branca sole basis for treatment. Nasal washings and aspirates are unacceptable for Xpert Xpress SARS-CoV-2/FLU/RSV testing.  Fact Sheet for Patients: BloggerCourse.com  Fact Sheet for Healthcare Providers: SeriousBroker.it  This test is not yet approved or cleared by the [place]  FDA and has been authorized for detection and/or diagnosis of SARS-CoV-2 by FDA under an Emergency Use Authorization (EUA). This EUA will remain in effect (meaning this test can be used) for the duration of the COVID-19 declaration under Section 564(b)(1) of the Act, 21 U.S.C. section 360bbb-3(b)(1), unless the authorization is terminated or revoked.     Resp Syncytial Virus by PCR NEGATIVE NEGATIVE Final    Comment: (NOTE) Fact Sheet for Patients: BloggerCourse.com  Fact Sheet for Healthcare Providers: SeriousBroker.it  This test is not yet approved or cleared by the [place]  FDA and has been authorized for detection and/or diagnosis of SARS-CoV-2 by FDA under an Emergency Use Authorization (EUA). This EUA will remain in effect (meaning this test can be used) for the duration of the COVID-19 declaration under Section 564(b)(1) of the Act, 21 U.S.C. section 360bbb-3(b)(1), unless the authorization is terminated or revoked.  Performed at [facility], [address], [place], [place] [address]   Blood culture (routine x 2)     Status: None   Collection Time: 09/03/23  1:49 AM   Specimen: BLOOD  Result Value Ref Range Status   Specimen  Description  Final    BLOOD RIGHT ANTECUBITAL Performed at [facility], [address], [place], [place] [address]    Special Requests   Final    BOTTLES DRAWN AEROBIC AND ANAEROBIC Blood Culture adequate volume Performed at [facility], [address] [place], [place] [address]    Culture   Final    NO GROWTH 5 DAYS Performed at [facility], [address] [place], [place] [address]    Report Status 09/08/2023 FINAL  Final  Blood culture (routine x 2)     Status: None   Collection Time: 09/03/23  3:00 AM   Specimen: BLOOD  Result Value Ref Range Status   Specimen Description   Final    BLOOD BLOOD RIGHT WRIST Performed at [facility], [address] [place], [place] [address]    Special Requests   Final    BOTTLES DRAWN AEROBIC AND ANAEROBIC Blood Culture adequate volume Performed at [facility], [address], [place], [place] [address]    Culture   Final    NO GROWTH 5 DAYS Performed at [facility], [address] [place], [place] [address]    Report Status 09/08/2023 FINAL  Final  MRSA Next Gen by PCR, Nasal     Status: None   Collection Time: 09/03/23  4:37 AM   Specimen: Nasal Mucosa; Nasal Swab  Result Value Ref Range Status   MRSA by PCR Next Gen NOT DETECTED NOT DETECTED Final    Comment: (NOTE) The GeneXpert MRSA Assay (FDA approved for NASAL specimens only), is one component of Loeta Herst comprehensive MRSA colonization surveillance program. It is not intended to diagnose MRSA infection nor to guide or monitor treatment for MRSA infections. Test performance is not FDA approved in patients less than 31 years old. Performed at [facility], [address] [place], [place] [address]   Gastrointestinal Panel by PCR , Stool     Status: None   Collection Time: 09/07/23  2:54 PM   Specimen: Stool  Result Value Ref Range Status   Campylobacter species NOT  DETECTED NOT DETECTED Final   Plesimonas shigelloides NOT DETECTED NOT DETECTED Final   Salmonella species NOT DETECTED NOT DETECTED Final   Yersinia enterocolitica NOT DETECTED NOT DETECTED Final   Vibrio species NOT DETECTED NOT DETECTED Final   Vibrio cholerae NOT DETECTED NOT DETECTED Final   Enteroaggregative E coli (EAEC) NOT DETECTED NOT DETECTED Final   Enteropathogenic E coli (EPEC) NOT DETECTED NOT DETECTED Final   Enterotoxigenic E coli (ETEC) NOT DETECTED NOT DETECTED Final   Shiga like toxin producing E coli (STEC) NOT DETECTED NOT DETECTED Final   Shigella/Enteroinvasive E coli (EIEC) NOT DETECTED NOT DETECTED Final   Cryptosporidium NOT DETECTED NOT DETECTED Final   Cyclospora cayetanensis NOT DETECTED NOT DETECTED Final   Entamoeba histolytica NOT DETECTED NOT DETECTED Final   Giardia lamblia NOT DETECTED NOT DETECTED Final   Adenovirus F40/41 NOT DETECTED NOT DETECTED Final   Astrovirus NOT DETECTED NOT DETECTED Final   Norovirus GI/GII NOT DETECTED NOT DETECTED Final   Rotavirus Olisa Quesnel NOT DETECTED NOT DETECTED Final   Sapovirus (I, II, IV, and V) NOT DETECTED NOT DETECTED Final    Comment: Performed at [facility], [address] [place], [place] [address]  Aerobic/Anaerobic Culture w Gram Stain (surgical/deep wound)     Status: None (Preliminary result)   Collection Time: 09/07/23  3:48 PM   Specimen:  Abscess  Result Value Ref Range Status   Specimen Description ABSCESS  Final   Special Requests ABD  Final   Gram Stain   Final    NO WBC SEEN NO ORGANISMS SEEN Performed at [facility], [address], [place], [place] [address]    Culture PENDING  Incomplete   Report Status PENDING  Incomplete         Radiology Studies: US  FNA SOFT TISSUE Result Date: 09/07/2023 INDICATION: 63 year old female with history of abdominal wall fluid collection. EXAM: Ultrasound-guided abdominal wall fluid collection aspiration. MEDICATIONS: The patient is  currently admitted to the hospital and receiving intravenous antibiotics. The antibiotics were administered within an appropriate time frame prior to the initiation of the procedure. ANESTHESIA/SEDATION: None. COMPLICATIONS: None immediate. PROCEDURE: Informed written consent was obtained from the patient after Raef Sprigg thorough discussion of the procedural risks, benefits and alternatives. All questions were addressed. Maximal Sterile Barrier Technique was utilized including caps, mask, sterile gowns, sterile gloves, sterile drape, hand hygiene and skin antiseptic. Onofrio Klemp timeout was performed prior to the initiation of the procedure. Preprocedure ultrasound evaluation demonstrated multifocal intra-abdominal hematomas and ovoid fluid collection just inferior to the right abdominal wall stoma. Procedure was planned. Subdermal Local anesthesia was administered at the planned needle entry site. Under direct ultrasound visualization, Sutton Plake 19 gauge Yueh needle was directed into the collection and aspiration was performed yielding approximately 5 mL of serosanguineous fluid. The sample was sent to the lab for culture. The needle was removed. Postprocedure ultrasound evaluation demonstrated no evidence of surrounding hematoma or other complicating features. Bijal Siglin sterile bandage was applied. The patient was transferred to the floor in good condition. IMPRESSION: Technically successful ultrasound-guided right abdominal wall fluid collection aspiration. Creasie Doctor, MD [facility] Electronically Signed   By: Creasie Doctor M.D.   On: 09/07/2023 16:42         Scheduled Meds:  atorvastatin   80 mg Oral Daily   Chlorhexidine  Gluconate Cloth  6 each Topical Daily   metoprolol  tartrate  50 mg Oral BID   pantoprazole   40 mg Oral Daily   Continuous Infusions:  heparin  1,400 Units/hr (09/08/23 1313)   piperacillin -tazobactam (ZOSYN )  IV 3.375 g (09/08/23 0946)     LOS: 5  days    Time spent: over 30 min    Donnetta Gains, MD [facility]   To contact the attending provider between 7A-7P or the covering provider during after hours 7P-7A, please log into the web site [email or web address] and access using universal [place] password for that web site. If you do not have the password, please call the hospital operator.  09/08/2023, 1:52 PM

## 2023-09-08 NOTE — Progress Notes (Signed)
 PHARMACY - ANTICOAGULATION CONSULT NOTE  Pharmacy Consult for Heparin  Indication: atrial fibrillation  Allergies  Allergen Reactions   Chlorthalidone  Other (See Comments)    ELEVATED KIDNEY FUNCTION     Patient Measurements: Height: 5\' 7"  (170.2 cm) Weight: 84.1 kg (185 lb 6.5 oz) IBW/kg (Calculated) : 61.6 HEPARIN  DW (KG): 84.8  Vital Signs: Temp: 98.5 F (36.9 C) (06/03 0634) Temp Source: Oral (06/03 0634) BP: 126/80 (06/03 0634) Pulse Rate: 109 (06/03 0634)  Labs: Recent Labs    09/06/23 0338 09/07/23 0347 09/07/23 1711 09/08/23 0409  HGB 8.4* 8.9*  --  8.7*  HCT 28.1* 30.2*  --  28.5*  PLT 353 351  --  337  APTT 93*  --   --   --   HEPARINUNFRC 0.41 0.24* 0.36 0.31  CREATININE 2.45* 2.92*  --  2.51*    Estimated Creatinine Clearance: 25.9 mL/min (A) (by C-G formula based on SCr of 2.51 mg/dL (H)).   Medical History: 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HCC)    Anemia    Chronic kidney disease    Essential hypertension 05/07/2021   GERD (gastroesophageal reflux disease)    Hernia, epigastric    Hypertension    PAF (paroxysmal atrial fibrillation) (HCC) 05/07/2021   Pure hypercholesterolemia 05/07/2021    Assessment:  AC/Heme: hep gtt for afib,  doppler negative - Takes Eliquis  PTA for afib (LD 5/28 PM) - Acute Superficial Vein Thrombosis of L Cephalic Vein : supportive care only - Iron panel low - Hgb 8.7, Plts WNL - HL 0.31 in goal  Goal of Therapy:  Heparin  level 0.3-0.7 units/ml Monitor platelets by anticoagulation protocol: Yes   Plan:  - Con't IV heparin  1400 units/hr.  - Heparin  level and CBC daily   Dorothy Harris, PharmD, BCPS Clinical Staff Pharmacist Dorothy Harris 09/08/2023,7:33 AM

## 2023-09-08 NOTE — TOC Progression Note (Addendum)
 Transition of Care [facility]) - Progression Note   Patient Details  Name: Dorothy Harris MRN: [id number] Date of Birth: 02-Sep-1960  [facility] CM/SW Contact  Zenon Hilda, LCSW Phone Number: 09/08/2023, 9:18 AM  Clinical Narrative: CSW notified by Crystal in admissions at [facility] that insurance authorization is pending.  Addendum: CSW notified by OT that patient wanted to discuss rehab options. Per chart review, [facility] and [facility] are the only [employer] with [employer]. CSW spoke with patient and patient does not want to return to [facility] or have the referral sent to [facility]. Patient asked about inpatient rehab. CSW explained that level of care was not recommended by PT/OT. CSW encouraged patient to follow up with sister regarding returning to SNF versus returning home. Patient aware that due to her insurance, it is not guaranteed patient would be set up with [facility] if she chooses to return home.  Expected Discharge Plan: Skilled Nursing Facility Barriers to Discharge: Continued Medical Work up, English as a second language teacher  Expected Discharge Plan and Services In-house Referral: Clinical Social Work Post Acute Care Choice: NA Living arrangements for the past 2 months: Skilled Nursing Facility [name])            DME Arranged: N/A DME Agency: NA  Social Determinants of Health (SDOH) Interventions SDOH Screenings   Food Insecurity: Food Insecurity Present (09/03/2023)  Housing: High Risk (09/03/2023)  Transportation Needs: No Transportation Needs (09/03/2023)  Utilities: Not At Risk (09/03/2023)  Alcohol Screen: Low Risk  (05/07/2021)  Depression (PHQ2-9): Medium Risk (05/05/2023)  Financial Resource Strain: Low Risk  (05/07/2021)  Physical Activity: Inactive (05/07/2021)  Tobacco Use: High Risk (09/05/2023)   Readmission Risk Interventions    09/03/2023   10:08 AM 08/11/2023    8:27 AM 08/07/2023    7:29 AM  Readmission Risk Prevention Plan   Medication Screening   Complete  Transportation Screening Complete Complete Complete  PCP or Specialist Appt within 5-7 Days Complete    Home Care Screening Complete    Medication Review (RN CM) Complete    HRI or Home Care Consult  Complete   Social Work Consult for Recovery Care Planning/Counseling  Complete   Palliative Care Screening  Not Applicable   Medication Review Oceanographer)  Complete

## 2023-09-09 ENCOUNTER — Encounter (HOSPITAL_COMMUNITY): Payer: Self-pay | Admitting: Internal Medicine

## 2023-09-09 ENCOUNTER — Inpatient Hospital Stay (HOSPITAL_COMMUNITY)

## 2023-09-09 DIAGNOSIS — I5033 Acute on chronic diastolic (congestive) heart failure: Secondary | ICD-10-CM | POA: Diagnosis not present

## 2023-09-09 LAB — CBC WITH DIFFERENTIAL/PLATELET
Abs Immature Granulocytes: 0.06 10*3/uL (ref 0.00–0.07)
Basophils Absolute: 0.1 10*3/uL (ref 0.0–0.1)
Basophils Relative: 1 %
Eosinophils Absolute: 0.2 10*3/uL (ref 0.0–0.5)
Eosinophils Relative: 2 %
HCT: 28.5 % — ABNORMAL LOW (ref 36.0–46.0)
Hemoglobin: 8.8 g/dL — ABNORMAL LOW (ref 12.0–15.0)
Immature Granulocytes: 1 %
Lymphocytes Relative: 17 %
Lymphs Abs: 1.5 10*3/uL (ref 0.7–4.0)
MCH: 30.1 pg (ref 26.0–34.0)
MCHC: 30.9 g/dL (ref 30.0–36.0)
MCV: 97.6 fL (ref 80.0–100.0)
Monocytes Absolute: 1.3 10*3/uL — ABNORMAL HIGH (ref 0.1–1.0)
Monocytes Relative: 14 %
Neutro Abs: 6 10*3/uL (ref 1.7–7.7)
Neutrophils Relative %: 65 %
Platelets: 292 10*3/uL (ref 150–400)
RBC: 2.92 MIL/uL — ABNORMAL LOW (ref 3.87–5.11)
RDW: 15.6 % — ABNORMAL HIGH (ref 11.5–15.5)
WBC: 9.2 10*3/uL (ref 4.0–10.5)
nRBC: 0 % (ref 0.0–0.2)

## 2023-09-09 LAB — COMPREHENSIVE METABOLIC PANEL WITH GFR
ALT: <5 U/L (ref 0–44)
AST: 15 U/L (ref 15–41)
Albumin: 3.1 g/dL — ABNORMAL LOW (ref 3.5–5.0)
Alkaline Phosphatase: 66 U/L (ref 38–126)
Anion gap: 14 (ref 5–15)
BUN: 36 mg/dL — ABNORMAL HIGH (ref 8–23)
CO2: 21 mmol/L — ABNORMAL LOW (ref 22–32)
Calcium: 8.9 mg/dL (ref 8.9–10.3)
Chloride: 102 mmol/L (ref 98–111)
Creatinine, Ser: 3.66 mg/dL — ABNORMAL HIGH (ref 0.44–1.00)
GFR, Estimated: 13 mL/min — ABNORMAL LOW (ref >60)
Glucose, Bld: 105 mg/dL — ABNORMAL HIGH (ref 70–99)
Potassium: 3.9 mmol/L (ref 3.5–5.1)
Sodium: 137 mmol/L (ref 135–145)
Total Bilirubin: 1.1 mg/dL (ref 0.0–1.2)
Total Protein: 7 g/dL (ref 6.5–8.1)

## 2023-09-09 LAB — MAGNESIUM: Magnesium: 1.8 mg/dL (ref 1.7–2.4)

## 2023-09-09 LAB — HEPARIN LEVEL (UNFRACTIONATED): Heparin Unfractionated: 0.34 [IU]/mL (ref 0.30–0.70)

## 2023-09-09 LAB — PHOSPHORUS: Phosphorus: 3.4 mg/dL (ref 2.5–4.6)

## 2023-09-09 MED ORDER — SODIUM CHLORIDE 0.9 % IV BOLUS
2000.0000 mL | Freq: Once | INTRAVENOUS | Status: AC
  Administered 2023-09-09: 2000 mL via INTRAVENOUS

## 2023-09-09 MED ORDER — NUTRISOURCE FIBER PO PACK
1.0000 | PACK | Freq: Two times a day (BID) | ORAL | Status: DC
  Administered 2023-09-09 – 2023-09-17 (×16): 1 via ORAL
  Filled 2023-09-09 (×17): qty 1

## 2023-09-09 MED ORDER — NUTRISOURCE FIBER PO PACK
1.0000 | PACK | Freq: Two times a day (BID) | ORAL | Status: DC
  Filled 2023-09-09: qty 1

## 2023-09-09 MED ORDER — ALBUMIN HUMAN 25 % IV SOLN
25.0000 g | Freq: Once | INTRAVENOUS | Status: AC
  Administered 2023-09-09: 25 g via INTRAVENOUS
  Filled 2023-09-09: qty 100

## 2023-09-09 MED ORDER — LACTATED RINGERS IV SOLN: INTRAVENOUS | Status: DC

## 2023-09-09 NOTE — Progress Notes (Signed)
 PHARMACY - ANTICOAGULATION CONSULT NOTE  Pharmacy Consult for Heparin  Indication: atrial fibrillation  Allergies  Allergen Reactions   Chlorthalidone  Other (See Comments)    ELEVATED KIDNEY FUNCTION     Patient Measurements: Height: 5\' 7"  (170.2 cm) Weight: 84 kg (185 lb 3 oz) IBW/kg (Calculated) : 61.6 HEPARIN  DW (KG): 84.8  Vital Signs: Temp: 98.3 F (36.8 C) (06/04 0425) BP: 112/65 (06/04 0425) Pulse Rate: 105 (06/04 0425)  Labs: Recent Labs    09/07/23 0347 09/07/23 1711 09/08/23 0409 09/09/23 0358  HGB 8.9*  --  8.7* 8.8*  HCT 30.2*  --  28.5* 28.5*  PLT 351  --  337 292  HEPARINUNFRC 0.24* 0.36 0.31 0.34  CREATININE 2.92*  --  2.51* 3.66*    Estimated Creatinine Clearance: 17.8 mL/min (A) (by C-G formula based on SCr of 3.66 mg/dL (H)).   Medical History: 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HCC)    Anemia    Chronic kidney disease    Essential hypertension 05/07/2021   GERD (gastroesophageal reflux disease)    Hernia, epigastric    Hypertension    PAF (paroxysmal atrial fibrillation) (HCC) 05/07/2021   Pure hypercholesterolemia 05/07/2021    Assessment:  AC/Heme: hep gtt for afib,  doppler negative - Takes Eliquis  PTA for afib (LD 5/28 PM) - Acute Superficial Vein Thrombosis of L Cephalic Vein : supportive care only - Iron panel low - Hgb 8.8 stable, Plts 292 trending down - HL 0.34 in goal  Goal of Therapy:  Heparin  level 0.3-0.7 units/ml Monitor platelets by anticoagulation protocol: Yes   Plan:  - Con't IV heparin  1400 units/hr.  - Heparin  level and CBC daily   Debbrah Sampedro Darcel Early, PharmD, BCPS Clinical Staff Pharmacist Enis Harsh Stillinger 09/09/2023,7:31 AM

## 2023-09-09 NOTE — Progress Notes (Signed)
 Physical Therapy Treatment Patient Details Name: Dorothy Harris MRN: [id number] DOB: [birth year] Today's Date: 09/09/2023   History of Present Illness Dorothy Harris is a 63 y.o. 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  Recently admitted to [facility] from 5/1-5/24 for high-grade SBO status post ex lap with lysis of adhesions and resection of small bowel with end ileostomy revision on 5/15.  Also treated for AKI on CKD stage IV due to volume depletion/dehydration treated with IV fluids and acute anemia treated with a total of 3 units PRBCs.  Started on oral metoprolol  for A-fib and Eliquis  was resumed on discharge and was discharged to SNF. ED presentation with leaking iliostomy bag. Re-admitted to [facility] on 09/03/23 with dx CHF, ARF, pulmonary edema.    PT Comments  AxO x 3 pleasant, a little groggy but easily aroused, motivated, eager to walk. Assisted OOB required increased assist.  General bed mobility comments: assist with upper body and to complete scooting to EOB.  Once upright, Pt was able to static sit on her own Supervision level. General transfer comment: First assisted from EOB to [facility] to void 1/4 pivot NO AD VC's on proper hand transfer and turn completion.    Assisted with peri care as Pt was able to stand with walker. General Gait Details: Pt was able to amb 12 feet + 2 Mod Assist with walker and recliner closely following.  Pt c/o R LE weakness s/p "stroke" per Pt. Returned to room in recliner.  Positioned to comfort. Prior to her 5/15 surgery, Pt was Indep/driving/working. Pt will need ST Rehab at SNF to address mobility and functional decline prior to safely returning home.    If plan is discharge home, recommend the following: A lot of help with bathing/dressing/bathroom;A lot of help with  walking and/or transfers;Help with stairs or ramp for entrance;Assistance with cooking/housework   Can travel by private vehicle     No  Equipment Recommendations  None recommended by PT    Recommendations for Other Services       Precautions / Restrictions Precautions Precautions: Fall Precaution/Restrictions Comments: ostomy Restrictions Weight Bearing Restrictions Per Provider Order: No     Mobility  Bed Mobility Overal bed mobility: Needs Assistance Bed Mobility: Supine to Sit     Supine to sit: Min assist, Mod assist     General bed mobility comments: assist with upper body and to complete scooting to EOB.  Once upright, Pt was able to static sit on her own Supervision level.    Transfers Overall transfer level: Needs assistance Equipment used: Rolling walker (2 wheels), None Transfers: Sit to/from Stand Sit to Stand: Mod assist, +2 physical assistance, From elevated surface           General transfer comment: First assisted from EOB to [facility] to void 1/4 pivot NO AD VC's on proper hand transfer and turn completion.    Assisted with peri care as Pt was able to stand with walker.    Ambulation/Gait Ambulation/Gait assistance: Min assist, Mod assist Gait Distance (Feet): 12 Feet Assistive device: Rolling walker (2 wheels) Gait Pattern/deviations: Step-to pattern, Decreased stride length, Shuffle Gait velocity: decreased     General Gait Details: Pt was able to amb 12 feet + 2 Mod Assist with walker and recliner closely following.  Pt c/o R LE weakness s/p "stroke" per Pt.  Stairs             Wheelchair Mobility     Tilt Bed    Modified Rankin (Stroke Patients Only)       Balance                                            Communication Communication Communication: No apparent difficulties  Cognition Arousal: Alert Behavior During Therapy: WFL for tasks assessed/performed   PT - Cognitive impairments: No apparent  impairments                       PT - Cognition Comments: AxO x 3 pleasant, a little groggy but easily aroused, motivated, eager to walk. Following commands: Intact      Cueing Cueing Techniques: Verbal cues  Exercises      General Comments        Pertinent Vitals/Pain Pain Assessment Pain Assessment: No/denies pain    Home Living                          Prior Function            PT Goals (current goals can now be found in the care plan section) Progress towards PT goals: Progressing toward goals    Frequency    Min 2X/week      PT Plan      Co-evaluation              AM-PAC PT "6 Clicks" Mobility   Outcome Measure  Help needed turning from your back to your side while in a flat bed without using bedrails?: A Lot Help needed moving from lying on your back to sitting on the side of a flat bed without using bedrails?: A Lot Help needed moving to and from a bed to a chair (including a wheelchair)?: A Lot Help needed standing up from a chair using your arms (e.g., wheelchair or bedside chair)?: A Lot Help needed to walk in hospital room?: A Lot Help needed climbing 3-5 steps with a railing? : Total 6 Click Score: 11    End of Session Equipment Utilized During Treatment: Gait belt Activity Tolerance: Patient limited by fatigue Patient left: with call bell/phone within reach;in chair;with chair alarm set Nurse Communication: Mobility status PT Visit Diagnosis: Unsteadiness on feet (R26.81);Other abnormalities of gait and mobility (R26.89);Muscle weakness (generalized) (M62.81);Pain Pain - Right/Left: Right Pain - part of body: Leg     Time: 1610-9604 PT Time Calculation (min) (ACUTE ONLY): 18 min  Charges:    $Gait Training: 8-22 mins $Therapeutic Activity: 8-22 mins PT General Charges $$ ACUTE PT VISIT: 1 Visit                     {Aziyah Provencal  PTA Acute  Rehabilitation Services Office M-F          [phone]

## 2023-09-09 NOTE — Progress Notes (Signed)
 PROGRESS NOTE Dorothy Harris  ZOX:[id number] DOB: October 26, 1960 DOA: 09/03/2023 PCP: [name], MD  Brief Narrative/Hospital Course: 63 y.o. 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  Recently admitted to [facility] from 5/1-5/24 for high-grade SBO status post ex lap with lysis of adhesions and resection of small bowel with end ileostomy revision on 5/15.  Also treated for AKI on CKD stage IV due to volume depletion/dehydration treated with IV fluids and acute anemia treated with a total of 3 units PRBCs. Started on oral metoprolol  for A-fib and Eliquis  was resumed and discharged to SNF. who presented to the ED from SNF for evaluation of leaking ileostomy bag and concern for sepsis on admission with imaging notable for multiple abdominal fluid collections and volume overload.   Surgery initially did not think fluid collections infected.  IR consulted for aspiration on 6/2.  Surgery still following.    Subjective: Patient seen and examined this morning Mild left-sided pain, Worried about high output from ileostomy Appears dry this morning Overnight afebrile BP stable on room air Labs reviewed creatinine further trending up 3.6 from 2.5 CBC with normal WBC hemoglobin about the same 8.8.  Assessment and Plan:  Multiple abdominal fluid collections concerning for abscess vs seromas/hematomas Ileus versus low-grade SBO Ostomy leakage recent SBO s/p ex-lap with LOA/Resection of SB w/h end ileostomy revision on 5/15: Hemodynamically stable although mild tachycardia, normal lactic acid and WBC count Following continue to treat mild cellulitis around incision, fluid collection concerning> repeat CT 6/1 with new wall thickening and inflammation of small bowel loops  leading up to the RLQ ileostomy (c/w nonspecific enteritis) and Increasing mesenteric edema and interloop fluid.  Trace free fluid in the pelvis.  Stable focal fat attenuation with scattered areas of internal fluid in the RLQ.  Stable anterior abdominal wall hematomas.Underwent ultrasound-guided right abdomen wall fluid collection aspiration-so far negative Staples removed per surgery. GI pathogen panel negative,Managing empirically with Zosyn - cont plan per CCS- CCS will follow up today.   Acute on chronic HFpEF Acute hypoxemic respiratory failure Pulmonary Edema Anasarca Volume Overload: Presenting with fluid overload bilateral leg edema GGO on right middle and lower lobes on imaging Recent weight 86 kg on 5/4 on 5/29 103 KG> diuresed along with element and net negative and weight improved as below Holding diuretics due to bump in creatinine. Echo 08/2023 with EF 60-65%, no RWMA.  Severe asymmetric LVH.  Diastolic function could not be evaluated.  Trivial MR.  Moderate calcification of AV. Cont to monitor daily I/O,weight, electrolytes and net balance as below Net IO Since Admission: -12,243.16 mL [09/09/23 0903]  Filed Weights   09/07/23 0500 09/08/23 0500 09/09/23 0459  Weight: 88 kg 84.1 kg 84 kg    Recent Labs  Lab 09/03/23 0149 09/03/23 0806 09/04/23 0321 09/05/23 0438 09/06/23 0338 09/07/23 0347 09/08/23 0409 09/09/23 0358  BNP 2,422.2*  --   --   --   --  496.9*  --   --   BUN 20   < > 21 21 23  26* 31* 36*  CREATININE 2.06*   < > 2.01* 2.17* 2.45* 2.92* 2.51* 3.66*  K 4.4   < > 4.3 3.7 3.7 3.9 3.6 3.9  MG  --    < > 1.9 1.7  --  1.7 1.7 1.8   < > =  values in this interval not displayed.    SIRS Afebrile, but leukocytosis, tachycardic, tachypnic on admit> which has improved. See discussion #1 on empiric antibiotics    Ground-glass opacities in the right middle and lower lobes: Suspecting due to volume overload rather than infection.  Diuresed.  Negative COVID flu and  RSV  Diarrhea High output ileostomy: CT with findings concerning for nonspecific enteritis.GI path panel - negative. If persistent, consider sending C diff (will hold off for now as ileostomy output down)   Left upper extremity edema Acute Superficial Vein Thrombosis of L Cephalic Vein Cont supportive care   Elevated Troponin: Mild elevation, without cardiac symptoms chest pain EKG sinus borderline ST elevation in lead III T wave inversions but similar   QT prolongation QTc 597 on EKG and patient is on amiodarone .  Monitor electrolytes caution with QT prolonging medication QTc has improved   Paroxysmal A-fib: Continue metoprolol  50 twice daily, currently on heparin  drip> once tomorrow patient with plan will transition to p.o.   AKI on CKD stage IV Baseline creatinine around low twos.  Noted bump in creatinine in the setting of diuresis.  Avoid hypotension, avoid nephrotoxic medications.  Monitor urine output renal function closely idue to significant bump consult nephrology, given dose of LMN.  She may need IV fluids as she appears dry and ileostomy high output Recent Labs    08/27/23 0108 08/28/23 0441 09/03/23 0149 09/03/23 0806 09/04/23 0321 09/05/23 0438 09/06/23 0338 09/07/23 0347 09/08/23 0409 09/09/23 0358  BUN 58* 47* 20 20 21 21 23  26* 31* 36*  CREATININE 2.15* 2.19* 2.06* 1.90* 2.01* 2.17* 2.45* 2.92* 2.51* 3.66*  CO2 25 25 25 23 25 25 26 23 24  21*  K 4.4 4.2 4.4 4.3 4.3 3.7 3.7 3.9 3.6 3.9    Hypertension Stable on metop   Hyperlipidemia Continue Lipitor .   Anemia-suspect anemia of 20s and iron deficiency anemia Recent Labs  Lab 09/05/23 0438 09/06/23 0338 09/07/23 0347 09/08/23 0409 09/09/23 0358  HGB 8.0* 8.4* 8.9* 8.7* 8.8*  HCT 27.1* 28.1* 30.2* 28.5* 28.5*   S/p 3 units PRBCs during recent admission.  Trend hemoglobin  GERD Continue Protonix .   Incidental stable AAA Patient will need outpatient follow-up with vascular surgery for follow-up  CTA or MRA in 6 months   DVT prophylaxis: Place and maintain sequential compression device Start: 09/03/23 0902 Code Status:   Code Status: Limited: Do not attempt resuscitation (DNR) -DNR-LIMITED -Do Not Intubate/DNI  Family Communication: plan of care discussed with patient at bedside. Patient status is: Remains hospitalized because of severity of illness Level of care: Telemetry   Dispo: The patient is from: home            Anticipated disposition: TBD Objective: Vitals last 24 hrs: Vitals:   09/08/23 0634 09/08/23 1259 09/09/23 0425 09/09/23 0459  BP: 126/80 120/67 112/65   Pulse: (!) 109 (!) 107 (!) 105   Resp:  17 16   Temp: 98.5 F (36.9 C) 98.9 F (37.2 C) 98.3 F (36.8 C)   TempSrc: Oral Oral    SpO2: 97% 97% 97%   Weight:    84 kg  Height:       Physical Examination: General exam: alert awake, older than stated age HEENT:Oral mucosa DRY, Ear/Nose WNL grossly Respiratory system: Bilaterally clear BS, no use of accessory muscle Cardiovascular system: S1 & S2 +. Gastrointestinal system: Abdomen soft, surgical site with dressing in place, right-sided ileostomy with semisolid output, mildly tender left sid,ND,BS+ Nervous  System: Alert, awake, following commands. Extremities: LE edema neg, warm extremities Skin: No rashes,warm. MSK: Normal muscle bulk/tone.   Data Reviewed: I have personally reviewed following labs and imaging studies ( see epic result tab) CBC: Recent Labs  Lab 09/03/23 0149 09/03/23 0806 09/04/23 0321 09/05/23 1610 09/06/23 0338 09/07/23 0347 09/08/23 0409 09/09/23 0358  WBC 15.4*   < > 12.6* 13.4* 12.9* 11.7* 8.8 9.2  NEUTROABS 12.4*  --  9.6* 10.2*  --   --   --  6.0  HGB 9.1*   < > 7.8* 8.0* 8.4* 8.9* 8.7* 8.8*  HCT 30.8*   < > 26.8* 27.1* 28.1* 30.2* 28.5* 28.5*  MCV 102.7*   < > 105.5* 101.9* 101.4* 103.8* 99.7 97.6  PLT 373   < > 306 331 353 351 337 292   < > = values in this interval not displayed.   CMP: Recent Labs  Lab  09/04/23 0321 09/05/23 0438 09/06/23 0338 09/07/23 0347 09/08/23 0409 09/09/23 0358  NA 140 137 136 135 133* 137  K 4.3 3.7 3.7 3.9 3.6 3.9  CL 106 100 99 98 99 102  CO2 25 25 26 23 24  21*  GLUCOSE 108* 108* 105* 98 110* 105*  BUN 21 21 23  26* 31* 36*  CREATININE 2.01* 2.17* 2.45* 2.92* 2.51* 3.66*  CALCIUM  8.2* 8.6* 8.6* 9.0 8.6* 8.9  MG 1.9 1.7  --  1.7 1.7 1.8  PHOS 3.7 3.3  --  2.2* 2.9 3.4   GFR: Estimated Creatinine Clearance: 17.8 mL/min (A) (by C-G formula based on SCr of 3.66 mg/dL (H)). Recent Labs  Lab 09/05/23 0438 09/06/23 0338 09/07/23 0347 09/08/23 0409 09/09/23 0358  AST 11* 10* 9* 12* 15  ALT 9 6 <5 6 <5  ALKPHOS 70 69 65 66 66  BILITOT 1.0 0.6 1.1 1.2 1.1  PROT 6.7 6.9 6.9 6.9 7.0  ALBUMIN  3.0* 2.9* 3.1* 3.2* 3.1*   No results for input(s): "LIPASE", "AMYLASE" in the last 168 hours. No results for input(s): "AMMONIA" in the last 168 hours. Coagulation Profile: No results for input(s): "INR", "PROTIME" in the last 168 hours. Unresulted Labs (From admission, onward)     Start     Ordered   09/06/23 0500  CBC  Daily,   R     Question:  Specimen collection method  Answer:  Lab=Lab collect   09/04/23 0853   09/06/23 0500  Heparin  level (unfractionated)  Daily,   R     Question:  Specimen collection method  Answer:  Lab=Lab collect   09/05/23 9604           Antimicrobials/Microbiology: Anti-infectives (From admission, onward)    Start     Dose/Rate Route Frequency Ordered Stop   09/06/23 1030  piperacillin -tazobactam (ZOSYN ) IVPB 3.375 g        3.375 g 12.5 mL/hr over 240 Minutes Intravenous Every 8 hours 09/06/23 1013     09/04/23 1600  vancomycin  (VANCOCIN ) IVPB 1000 mg/200 mL premix  Status:  Discontinued        1,000 mg 200 mL/hr over 60 Minutes Intravenous Every 36 hours 09/03/23 0655 09/04/23 0815   09/04/23 1000  ceFAZolin  (ANCEF ) IVPB 2g/100 mL premix  Status:  Discontinued        2 g 200 mL/hr over 30 Minutes Intravenous Every 8 hours  09/04/23 0815 09/04/23 0901   09/04/23 1000  ceFAZolin  (ANCEF ) IVPB 2g/100 mL premix  Status:  Discontinued        2  g 200 mL/hr over 30 Minutes Intravenous Every 12 hours 09/04/23 0901 09/06/23 1004   09/03/23 1600  metroNIDAZOLE  (FLAGYL ) IVPB 500 mg  Status:  Discontinued        500 mg 100 mL/hr over 60 Minutes Intravenous Every 12 hours 09/03/23 0643 09/04/23 0815   09/03/23 1500  ceFEPIme  (MAXIPIME ) 2 g in sodium chloride  0.9 % 100 mL IVPB  Status:  Discontinued        2 g 200 mL/hr over 30 Minutes Intravenous Every 12 hours 09/03/23 0655 09/04/23 0815   09/03/23 0400  metroNIDAZOLE  (FLAGYL ) IVPB 500 mg        500 mg 100 mL/hr over 60 Minutes Intravenous  Once 09/03/23 0358 09/03/23 0535   09/03/23 0330  vancomycin  (VANCOREADY) IVPB 1750 mg/350 mL        1,750 mg 175 mL/hr over 120 Minutes Intravenous  Once 09/03/23 0325 09/03/23 0608   09/03/23 0300  ceFEPIme  (MAXIPIME ) 2 g in sodium chloride  0.9 % 100 mL IVPB        2 g 200 mL/hr over 30 Minutes Intravenous  Once 09/03/23 0251 09/03/23 0336         Component Value Date/Time   SDES ABSCESS 09/07/2023 1548   SPECREQUEST ABD 09/07/2023 1548   CULT  09/07/2023 1548    NO GROWTH 2 DAYS Performed at [facility], [address], [place], [place] [address]    REPTSTATUS PENDING 09/07/2023 1548    Procedures:  Medications reviewed:  Scheduled Meds:  amiodarone   200 mg Oral BID   atorvastatin   80 mg Oral Daily   Chlorhexidine  Gluconate Cloth  6 each Topical Daily   fiber  1 packet Per Tube BID   metoprolol  tartrate  50 mg Oral BID   pantoprazole   40 mg Oral Daily   Continuous Infusions:  albumin  human     heparin  1,400 Units/hr (09/09/23 0732)   piperacillin -tazobactam (ZOSYN )  IV 3.375 g (09/09/23 0220)    Lesa Rape, MD [facility] 09/09/2023, 10:04 AM

## 2023-09-09 NOTE — Progress Notes (Signed)
 Subjective/Chief Complaint: Patient reports that she is having no pain this AM. Dorothy Harris remains in no acute distress.  Patient is tolerating diet without nausea or vomiting. Patient stated that she had a moderate ostomy output and that she emptied the bag herself prior to my arrival to her room. Staples were removed yesterday (6/3) and wound appears to be clean, dry and intact w/o signs of dehiscence or infection. Patient is afebrile.    Objective: Vital signs in last 24 hours: Temp:  [98.3 F (36.8 C)-98.9 F (37.2 C)] 98.3 F (36.8 C) (06/04 0425) Pulse Rate:  [105-107] 105 (06/04 0425) Resp:  [16-17] 16 (06/04 0425) BP: (112-120)/(65-67) 112/65 (06/04 0425) SpO2:  [97 %] 97 % (06/04 0425) Weight:  [84 kg] 84 kg (06/04 0459) Last BM Date : 09/09/23  Intake/Output from previous day: 06/03 0701 - 06/04 0700 In: 240 [P.O.:240] Out: 2055 [Urine:300; Stool:1755] Intake/Output this shift: No intake/output data recorded.  Abd soft and approp. tender, ileostomy bag in place, wound C/D/I w/o signs of infection.  Lab Results:  Recent Labs    09/08/23 0409 09/09/23 0358  WBC 8.8 9.2  HGB 8.7* 8.8*  HCT 28.5* 28.5*  PLT 337 292   BMET Recent Labs    09/08/23 0409 09/09/23 0358  NA 133* 137  K 3.6 3.9  CL 99 102  CO2 24 21*  GLUCOSE 110* 105*  BUN 31* 36*  CREATININE 2.51* 3.66*  CALCIUM  8.6* 8.9   PT/INR No results for input(s): "LABPROT", "INR" in the last 72 hours. ABG No results for input(s): "PHART", "HCO3" in the last 72 hours.  Invalid input(s): "PCO2", "PO2"  Studies/Results: US  FNA SOFT TISSUE Result Date: 09/07/2023 INDICATION: 63 year old female with history of abdominal wall fluid collection. EXAM: Ultrasound-guided abdominal wall fluid collection aspiration. MEDICATIONS: The patient is currently admitted to the hospital and receiving intravenous antibiotics. The antibiotics were administered within an appropriate time frame prior to the initiation of  the procedure. ANESTHESIA/SEDATION: None. COMPLICATIONS: None immediate. PROCEDURE: Informed written consent was obtained from the patient after a thorough discussion of the procedural risks, benefits and alternatives. All questions were addressed. Maximal Sterile Barrier Technique was utilized including caps, mask, sterile gowns, sterile gloves, sterile drape, hand hygiene and skin antiseptic. A timeout was performed prior to the initiation of the procedure. Preprocedure ultrasound evaluation demonstrated multifocal intra-abdominal hematomas and ovoid fluid collection just inferior to the right abdominal wall stoma. Procedure was planned. Subdermal Local anesthesia was administered at the planned needle entry site. Under direct ultrasound visualization, a 19 gauge Yueh needle was directed into the collection and aspiration was performed yielding approximately 5 mL of serosanguineous fluid. The sample was sent to the lab for culture. The needle was removed. Postprocedure ultrasound evaluation demonstrated no evidence of surrounding hematoma or other complicating features. A sterile bandage was applied. The patient was transferred to the floor in good condition. IMPRESSION: Technically successful ultrasound-guided right abdominal wall fluid collection aspiration. Creasie Doctor, MD [facility] Electronically Signed   By: Creasie Doctor M.D.   On: 09/07/2023 16:42    Anti-infectives: Anti-infectives (From admission, onward)    Start     Dose/Rate Route Frequency Ordered Stop   09/06/23 1030  piperacillin -tazobactam (ZOSYN ) IVPB 3.375 g        3.375 g 12.5 mL/hr over 240 Minutes Intravenous Every 8 hours 09/06/23 1013     09/04/23 1600  vancomycin  (VANCOCIN ) IVPB 1000 mg/200 mL premix  Status:  Discontinued        1,000 mg 200 mL/hr over 60 Minutes Intravenous Every 36 hours 09/03/23 0655 09/04/23 0815   09/04/23 1000  ceFAZolin  (ANCEF ) IVPB 2g/100 mL  premix  Status:  Discontinued        2 g 200 mL/hr over 30 Minutes Intravenous Every 8 hours 09/04/23 0815 09/04/23 0901   09/04/23 1000  ceFAZolin  (ANCEF ) IVPB 2g/100 mL premix  Status:  Discontinued        2 g 200 mL/hr over 30 Minutes Intravenous Every 12 hours 09/04/23 0901 09/06/23 1004   09/03/23 1600  metroNIDAZOLE  (FLAGYL ) IVPB 500 mg  Status:  Discontinued        500 mg 100 mL/hr over 60 Minutes Intravenous Every 12 hours 09/03/23 0643 09/04/23 0815   09/03/23 1500  ceFEPIme  (MAXIPIME ) 2 g in sodium chloride  0.9 % 100 mL IVPB  Status:  Discontinued        2 g 200 mL/hr over 30 Minutes Intravenous Every 12 hours 09/03/23 0655 09/04/23 0815   09/03/23 0400  metroNIDAZOLE  (FLAGYL ) IVPB 500 mg        500 mg 100 mL/hr over 60 Minutes Intravenous  Once 09/03/23 0358 09/03/23 0535   09/03/23 0330  vancomycin  (VANCOREADY) IVPB 1750 mg/350 mL        1,750 mg 175 mL/hr over 120 Minutes Intravenous  Once 09/03/23 0325 09/03/23 0608   09/03/23 0300  ceFEPIme  (MAXIPIME ) 2 g in sodium chloride  0.9 % 100 mL IVPB        2 g 200 mL/hr over 30 Minutes Intravenous  Once 09/03/23 0251 09/03/23 0336       Assessment/Plan: POD 18 - s/p ex lap, SBR, LOA, ileostomy revision, VHR at [facility] - Dr. Collene Dawson - continue IV Zosyn  - WBC normal at 9.2  -Repeat CT done 6/1 to reassess these collections- CT shows increasing mesenteric edema and interloop fluid. No free air.  IR performed drainage on 6/2>>3 mL of serosanguinous fluid aspirated.  - Continue on soft diet - Continue to monitor midline wound    FEN - soft diet VTE - SCD's, on hep gtt instead of DOAC at this time for afib ID - Zosyn   Admit - [facility] service    ?possible pneumonia/pneumonitis  CHFpEF HLD Hx aortic dissection Obesity CKD IV afib    I reviewed nursing notes, hospitalist notes, last 24 h vitals and pain scores, last 48 h intake and output, last 24 h labs and trends, and last 24 h imaging results.   Dorothy Ammon,  PA-C 09/09/2023
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
## 2023-09-09 NOTE — Plan of Care (Signed)

## 2023-09-09 NOTE — Hospital Course (Addendum)
 63 y.o. 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  Recently admitted to [facility] from 5/1-5/24 for high-grade SBO status post ex lap with lysis of adhesions and resection of small bowel with end ileostomy revision on 5/15.  Also treated for AKI on CKD stage IV due to volume depletion/dehydration treated with IV fluids and acute anemia treated with a total of 3 units PRBCs. Started on oral metoprolol  for A-fib and Eliquis  was resumed and discharged to SNF. who presented to the ED from SNF for evaluation of leaking ileostomy bag and concern for sepsis on admission with imaging notable for multiple abdominal fluid collections and volume overload.   Surgery initially did not think fluid collections infected.  IR consulted for aspiration on 6/2.  Surgery still following.   Antibiotic discontinued, patient had AKI that improved with IV fluids seen by nephrology.  At this time clinically improved and stabilized surgery signed off. Once bed available, pt will be discharged to rehab.  Subjective: Seen and examined Resting comfortably No complaints Overnight remains afebrile BP stable she is waiting for placement no new changes   Assessment and Plan:  Multiple abdominal fluid collections concerning for abscess vs seromas/hematomas Ileus versus low-grade SBO Ostomy leakage/with high output-nonspecific enteritis recent SBO s/p ex-lap with LOA/Resection of SB w/h end ileostomy revision on 5/15: Treated for mild cellulitis around incision AND fluid collection concerning- had repeat CT 6/1 with new wall thickening and inflammation of small bowel loops leading up to the RLQ ileostomy c/w nonspecific enteritis  and Increasing mesenteric edema and interloop fluid.  Trace free fluid  in the pelvis.  s/p US -guided right abdomen wall fluid collection aspiration-so far negative culture> per [facility]-Antibiotic discontinued as no concern for infection.Staples removed .Tolerating  Po and ileostomy output has improved continue fiber   Acute on chronic HFpEF Acute hypoxemic respiratory failure Pulmonary Edema Anasarca Volume Overload: Presented with fluid overload bilateral leg edema GGO on right middle and lower lobes on imaging Recent weight 86 kg on 5/4 on 5/29 103 KG> diuresed  w/ net negative and weight improved as below. Lasix  held due to AKI, s/p IV fluids infusion and boluses per nephrology and creatinine improved  Echo 08/2023 with EF 60-65%, no RWMA. Severe asymmetric LVH.  Monitor Net IO Since Admission: -9,587.18 mL [09/16/23 0854]  Filed Weights   09/14/23 0402 09/15/23 0603 09/16/23 0500  Weight: 85.8 kg 85.9 kg 85.4 kg    Recent Labs  Lab 09/10/23 0409 09/11/23 0340 09/12/23 0355 09/13/23 0343 09/14/23 0345  BUN 32* 27* 23 23 26*  CREATININE 3.15* 2.61* 2.36* 2.39* 2.39*  K 4.1 3.6 3.7 4.0 3.9    SIRS Afebrile, but leukocytosis, tachycardic, tachypnic on admit> currently stable   Ground-glass opacities in the right middle and lower lobes: Suspecting due to volume overload rather than infection.  Diuresed.  Negative COVID flu and RSV.  Left upper extremity edema Acute Superficial Vein Thrombosis of L Cephalic Vein Cont supportive care   Elevated Troponin: Mildly up  no chest pain EKG with borderline ST elevation in lead III T wave inversions but similar before. Tte as above.   QT prolongation QTc 597 on EKG and patient is on amiodarone .  Monitor electrolytes caution with QT prolonging medication QTc has improved   Paroxysmal A-fib: Rate controlled continuem metoprolol , amiodarone  eliquis  6/5  AKI on CKD stage IV recent baseline creatinine around 2-2.3 in May. Bump in creatinine in the setting of diuresis and high ileostomy output > seen by  nephrology received aggressive fluid resuscitation and Lasix  held.  Creatinine has improved to baseline.   Recent Labs    09/05/23 0438 09/06/23 0338 09/07/23 0347 09/08/23 0409 09/09/23 0358 09/10/23 0409 09/11/23 0340 09/12/23 0355 09/13/23 0343 09/14/23 0345  BUN 21 23 26* 31* 36* 32* 27* 23 23 26*  CREATININE 2.17* 2.45* 2.92* 2.51* 3.66* 3.15* 2.61* 2.36* 2.39* 2.39*  CO2 25 26 23 24  21* 19* 19* 22 21* 22  K 3.7 3.7 3.9 3.6 3.9 4.1 3.6 3.7 4.0 3.9    Hypertension Stable on metoprolol .   Hyperlipidemia Continue Lipitor .   Anemia-suspect anemia of 20s and iron deficiency anemia Stable in 8 to 9 g.    GERD Continue Protonix .   Incidental stable AAA Patient will need outpatient follow-up with vascular surgery for follow-up CTA or MRA in 6 months   Deconditioning/debility: Awaiting for placement to SNF.

## 2023-09-09 NOTE — TOC Progression Note (Addendum)
 Transition of Care [facility]) - Progression Note   Patient Details  Name: Dorothy Harris MRN: [id number] Date of Birth: 1960/06/25  [facility] CM/SW Contact  Zenon Hilda, LCSW Phone Number: 09/09/2023, 11:51 AM  Clinical Narrative: CSW met with patient to discuss discharge options. Patient stated, "I'd rather go to jail than go back to [place]." CSW discussed with patient that [place] and [facility] are the [employer], so if patient declines to go there would need to be another discharge plan. Patient reported she lives alone and cannot care for herself at home in her current state. Patient requested that CSW reach out to her sister, [name].  CSW followed up with sister. Sister asked about additional SNFs. CSW explained that due to patient's commercial insurance, there are limited rehab options. Sister asked about "emergency Medicare" and getting the patient "a Child psychotherapist with DSS" to help. CSW explained patient is not 24, so Medicare is not currently available to her and sister can reach out to DSS to start a Medicaid application, but that even if the patient met criteria it would take 1-2 months for approval. Sister asked what other resources were available to the patient. CSW reported that [facility] referrals could be made, but if the patient's insurance is not in-network the patient may need an alternative therapy plan. Sister expressed frustration with the limited SNF and resource options and reported she would follow up with CSW.  CSW also provided update to patient's sister, [name].  Expected Discharge Plan: Skilled Nursing Facility Barriers to Discharge: Continued Medical Work up, English as a second language teacher  Expected Discharge Plan and Services In-house Referral: Clinical Social Work Post Acute Care Choice: NA Living arrangements for the past 2 months: Skilled Nursing Facility [name])           DME Arranged: N/A DME Agency: NA  Social  Determinants of Health (SDOH) Interventions SDOH Screenings   Food Insecurity: Food Insecurity Present (09/03/2023)  Housing: High Risk (09/03/2023)  Transportation Needs: No Transportation Needs (09/03/2023)  Utilities: Not At Risk (09/03/2023)  Alcohol Screen: Low Risk  (05/07/2021)  Depression (PHQ2-9): Medium Risk (05/05/2023)  Financial Resource Strain: Low Risk  (05/07/2021)  Physical Activity: Inactive (05/07/2021)  Tobacco Use: High Risk (09/05/2023)   Readmission Risk Interventions    09/03/2023   10:08 AM 08/11/2023    8:27 AM 08/07/2023    7:29 AM  Readmission Risk Prevention Plan  Medication Screening   Complete  Transportation Screening Complete Complete Complete  PCP or Specialist Appt within 5-7 Days Complete    Home Care Screening Complete    Medication Review (RN CM) Complete    HRI or Home Care Consult  Complete   Social Work Consult for Recovery Care Planning/Counseling  Complete   Palliative Care Screening  Not Applicable   Medication Review Oceanographer)  Complete

## 2023-09-10 DIAGNOSIS — I5033 Acute on chronic diastolic (congestive) heart failure: Secondary | ICD-10-CM | POA: Diagnosis not present

## 2023-09-10 LAB — CBC
HCT: 27.8 % — ABNORMAL LOW (ref 36.0–46.0)
Hemoglobin: 8.2 g/dL — ABNORMAL LOW (ref 12.0–15.0)
MCH: 31.1 pg (ref 26.0–34.0)
MCHC: 29.5 g/dL — ABNORMAL LOW (ref 30.0–36.0)
MCV: 105.3 fL — ABNORMAL HIGH (ref 80.0–100.0)
Platelets: 278 10*3/uL (ref 150–400)
RBC: 2.64 MIL/uL — ABNORMAL LOW (ref 3.87–5.11)
RDW: 15.9 % — ABNORMAL HIGH (ref 11.5–15.5)
WBC: 7.3 10*3/uL (ref 4.0–10.5)
nRBC: 0 % (ref 0.0–0.2)

## 2023-09-10 LAB — BASIC METABOLIC PANEL WITH GFR
Anion gap: 12 (ref 5–15)
BUN: 32 mg/dL — ABNORMAL HIGH (ref 8–23)
CO2: 19 mmol/L — ABNORMAL LOW (ref 22–32)
Calcium: 8.7 mg/dL — ABNORMAL LOW (ref 8.9–10.3)
Chloride: 106 mmol/L (ref 98–111)
Creatinine, Ser: 3.15 mg/dL — ABNORMAL HIGH (ref 0.44–1.00)
GFR, Estimated: 16 mL/min — ABNORMAL LOW (ref >60)
Glucose, Bld: 94 mg/dL (ref 70–99)
Potassium: 4.1 mmol/L (ref 3.5–5.1)
Sodium: 137 mmol/L (ref 135–145)

## 2023-09-10 LAB — URINALYSIS, ROUTINE W REFLEX MICROSCOPIC
Bacteria, UA: NONE SEEN
Bilirubin Urine: NEGATIVE
Glucose, UA: NEGATIVE mg/dL
Ketones, ur: NEGATIVE mg/dL
Leukocytes,Ua: NEGATIVE
Nitrite: NEGATIVE
Protein, ur: 30 mg/dL — AB
Specific Gravity, Urine: 1.014 (ref 1.005–1.030)
pH: 5 (ref 5.0–8.0)

## 2023-09-10 LAB — SODIUM, URINE, RANDOM: Sodium, Ur: 24 mmol/L

## 2023-09-10 LAB — HEPARIN LEVEL (UNFRACTIONATED): Heparin Unfractionated: 0.37 [IU]/mL (ref 0.30–0.70)

## 2023-09-10 LAB — CREATININE, URINE, RANDOM: Creatinine, Urine: 142 mg/dL

## 2023-09-10 MED ORDER — SODIUM CHLORIDE 0.9 % IV BOLUS
1500.0000 mL | Freq: Once | INTRAVENOUS | Status: AC
  Administered 2023-09-10: 1500 mL via INTRAVENOUS

## 2023-09-10 MED ORDER — LACTATED RINGERS IV SOLN: INTRAVENOUS | Status: AC

## 2023-09-10 MED ORDER — APIXABAN 5 MG PO TABS
5.0000 mg | ORAL_TABLET | Freq: Two times a day (BID) | ORAL | Status: DC
  Administered 2023-09-10 – 2023-09-17 (×14): 5 mg via ORAL
  Filled 2023-09-10 (×14): qty 1

## 2023-09-10 NOTE — Progress Notes (Addendum)
 PROGRESS NOTE Dorothy Harris  UJW:[id number] DOB: 1960-12-22 DOA: 09/03/2023 PCP: [name], MD  Brief Narrative/Hospital Course: 63 y.o. 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  Recently admitted to [facility] from 5/1-5/24 for high-grade SBO status post ex lap with lysis of adhesions and resection of small bowel with end ileostomy revision on 5/15.  Also treated for AKI on CKD stage IV due to volume depletion/dehydration treated with IV fluids and acute anemia treated with a total of 3 units PRBCs. Started on oral metoprolol  for A-fib and Eliquis  was resumed and discharged to SNF. who presented to the ED from SNF for evaluation of leaking ileostomy bag and concern for sepsis on admission with imaging notable for multiple abdominal fluid collections and volume overload.   Surgery initially did not think fluid collections infected.  IR consulted for aspiration on 6/2.  Surgery still following.    Subjective: Seen examined She feels well, Overnight mild tachycardia otherwise BP stable, on room air Labs reviewed creatinine downtrending 3.1 hemoglobin stable Stool output down to 300 cc after being on Fiber no nausea vomiting Denies any respiratory symptoms or cough Needing INO  Assessment and Plan:  Multiple abdominal fluid collections concerning for abscess vs seromas/hematomas Ileus versus low-grade SBO Ostomy leakage recent SBO s/p ex-lap with LOA/Resection of SB w/h end ileostomy revision on 5/15: Hemodynamically stable although mild tachycardia, normal lactic acid and WBC count Following continue to treat mild cellulitis around incision, fluid collection concerning> repeat CT 6/1 with new wall thickening and inflammation of small bowel loops leading up  to the RLQ ileostomy (c/w nonspecific enteritis) and Increasing mesenteric edema and interloop fluid.  Trace free fluid in the pelvis.  Stable focal fat attenuation with scattered areas of internal fluid in the RLQ. Stable anterior abdominal wall hematomas s/p US -guided right abdomen wall fluid collection aspiration-so far negative culture. Staples removed per surgery. Surgery following closely appreciate input, GI pathogen panel negative. Continue on empiric Zosyn  to complete the course With high output in ileostomy diet advanced and soft and fiber added Cont plan per CCS   Acute on chronic HFpEF Acute hypoxemic respiratory failure Pulmonary Edema Anasarca Volume Overload: Presenting with fluid overload bilateral leg edema GGO on right middle and lower lobes on imaging Recent weight 86 kg on 5/4 on 5/29 103 KG> diuresed  w/ net negative and weight improved>NOW holding diuretics due to AKI Echo 08/2023 with EF 60-65%, no RWMA. Severe asymmetric LVH.  Diastolic function could not be evaluated.  Trivial MR. Mod calcification of AV.  Diuretics on hold due to AKI.  Monitor Net IO Since Admission: -9,231.13 mL [09/10/23 0920]  Filed Weights   09/08/23 0500 09/09/23 0459 09/10/23 0500  Weight: 84.1 kg 84 kg 86.4 kg    Recent Labs  Lab 09/04/23 0321 09/05/23 0438 09/06/23 0338 09/07/23 0347 09/08/23 0409 09/09/23 0358 09/10/23 0409  BNP  --   --   --  496.9*  --   --   --   BUN 21 21 23  26* 31* 36* 32*  CREATININE 2.01* 2.17* 2.45* 2.92* 2.51* 3.66* 3.15*  K 4.3 3.7 3.7 3.9 3.6 3.9 4.1  MG 1.9 1.7  --  1.7 1.7 1.8  --     SIRS Afebrile, but leukocytosis, tachycardic, tachypnic on admit> which has improved. See discussion #1  on empiric antibiotics    Ground-glass opacities in the right middle and lower lobes: Suspecting due to volume overload rather than infection.  Diuresed.  Negative COVID flu and RSV  Diarrhea High output ileostomy: CT with findings concerning for nonspecific  enteritis.GI path panel - negative.  Fiber twice daily added per CCS.  Monitor    Left upper extremity edema Acute Superficial Vein Thrombosis of L Cephalic Vein Cont supportive care   Elevated Troponin: Mild elevation, without cardiac symptoms chest pain EKG sinus borderline ST elevation in lead III T wave inversions but similar   QT prolongation QTc 597 on EKG and patient is on amiodarone .  Monitor electrolytes caution with QT prolonging medication QTc has improved   Paroxysmal A-fib: Continue metoprolol  50 twice daily, currently on heparin  drip> Transition to p.o. once no more procedures   AKI on CKD stage IV Baseline creatinine around low twos.  Noted bump in creatinine in the setting of diuresis.-Nephrology consulted, IV fluids restarted with improving creatinine subsequently.  Holding diuretics. Avoid nephrotoxic medications.  Monitor urine output renal function closely idue to significant bump consult nephrology, given dose of LMN.  She may need IV fluids as she appears dry and ileostomy high output Recent Labs    08/28/23 0441 09/03/23 0149 09/03/23 0806 09/04/23 0321 09/05/23 0438 09/06/23 0338 09/07/23 0347 09/08/23 0409 09/09/23 0358 09/10/23 0409  BUN 47* 20 20 21 21 23  26* 31* 36* 32*  CREATININE 2.19* 2.06* 1.90* 2.01* 2.17* 2.45* 2.92* 2.51* 3.66* 3.15*  CO2 25 25 23 25 25 26 23 24  21* 19*  K 4.2 4.4 4.3 4.3 3.7 3.7 3.9 3.6 3.9 4.1    Hypertension Stable on metoprolol .   Hyperlipidemia Continue Lipitor .   Anemia-suspect anemia of 20s and iron deficiency anemia Hemoglobin remains overall stable monitor Recent Labs  Lab 09/06/23 0338 09/07/23 0347 09/08/23 0409 09/09/23 0358 09/10/23 0409  HGB 8.4* 8.9* 8.7* 8.8* 8.2*  HCT 28.1* 30.2* 28.5* 28.5* 27.8*   GERD Continue Protonix .   Incidental stable AAA Patient will need outpatient follow-up with vascular surgery for follow-up CTA or MRA in 6 months   DVT prophylaxis: Place and maintain  sequential compression device Start: 09/03/23 0902 Code Status:   Code Status: Limited: Do not attempt resuscitation (DNR) -DNR-LIMITED -Do Not Intubate/DNI  Family Communication: plan of care discussed with patient at bedside. Patient status is: Remains hospitalized because of severity of illness Level of care: Telemetry   Dispo: The patient is from: home            Anticipated disposition: Anticipating discharge soon once SNF available AS EARLY AS TOMORROW  Objective: Vitals last 24 hrs: Vitals:   09/09/23 1245 09/09/23 2001 09/10/23 0500 09/10/23 0607  BP: 122/62 128/73  (!) 148/86  Pulse: (!) 104 96  (!) 103  Resp: 18 20    Temp: 97.6 F (36.4 C) 98 F (36.7 C)  98.4 F (36.9 C)  TempSrc: Oral Oral  Oral  SpO2: 98% 98%  96%  Weight:   86.4 kg   Height:       Physical Examination: General exam: alert awake, oriented HEENT:Oral mucosa moist, Ear/Nose WNL grossly Respiratory system: Bilaterally clear BS,no use of accessory muscle Cardiovascular system: S1 & S2 +, No JVD. Gastrointestinal system: Abdomen soft, surgical site with dressing in place right-sided ileostomy with semisolid stool slightly tender abdomen,ND, BS+ Nervous System: Alert, awake, moving all extremities,and following commands. Extremities: LE edema neg,distal peripheral pulses palpable and warm.  Skin: No  rashes,no icterus. MSK: Normal muscle bulk,tone, power   Data Reviewed: I have personally reviewed following labs and imaging studies ( see epic result tab) CBC: Recent Labs  Lab 09/04/23 0321 09/05/23 0438 09/06/23 0338 09/07/23 0347 09/08/23 0409 09/09/23 0358 09/10/23 0409  WBC 12.6* 13.4* 12.9* 11.7* 8.8 9.2 7.3  NEUTROABS 9.6* 10.2*  --   --   --  6.0  --   HGB 7.8* 8.0* 8.4* 8.9* 8.7* 8.8* 8.2*  HCT 26.8* 27.1* 28.1* 30.2* 28.5* 28.5* 27.8*  MCV 105.5* 101.9* 101.4* 103.8* 99.7 97.6 105.3*  PLT 306 331 353 351 337 292 278   CMP: Recent Labs  Lab 09/04/23 0321 09/05/23 0438  09/06/23 0338 09/07/23 0347 09/08/23 0409 09/09/23 0358 09/10/23 0409  NA 140 137 136 135 133* 137 137  K 4.3 3.7 3.7 3.9 3.6 3.9 4.1  CL 106 100 99 98 99 102 106  CO2 25 25 26 23 24  21* 19*  GLUCOSE 108* 108* 105* 98 110* 105* 94  BUN 21 21 23  26* 31* 36* 32*  CREATININE 2.01* 2.17* 2.45* 2.92* 2.51* 3.66* 3.15*  CALCIUM  8.2* 8.6* 8.6* 9.0 8.6* 8.9 8.7*  MG 1.9 1.7  --  1.7 1.7 1.8  --   PHOS 3.7 3.3  --  2.2* 2.9 3.4  --    GFR: Estimated Creatinine Clearance: 20.9 mL/min (A) (by C-G formula based on SCr of 3.15 mg/dL (H)). Recent Labs  Lab 09/05/23 0438 09/06/23 0338 09/07/23 0347 09/08/23 0409 09/09/23 0358  AST 11* 10* 9* 12* 15  ALT 9 6 <5 6 <5  ALKPHOS 70 69 65 66 66  BILITOT 1.0 0.6 1.1 1.2 1.1  PROT 6.7 6.9 6.9 6.9 7.0  ALBUMIN  3.0* 2.9* 3.1* 3.2* 3.1*   No results for input(s): "LIPASE", "AMYLASE" in the last 168 hours. No results for input(s): "AMMONIA" in the last 168 hours. Coagulation Profile: No results for input(s): "INR", "PROTIME" in the last 168 hours. Unresulted Labs (From admission, onward)     Start     Ordered   09/10/23 0500  Basic metabolic panel with GFR  Daily,   R     Question:  Specimen collection method  Answer:  Lab=Lab collect   09/09/23 1736   09/06/23 0500  CBC  Daily,   R     Question:  Specimen collection method  Answer:  Lab=Lab collect   09/04/23 0853   09/06/23 0500  Heparin  level (unfractionated)  Daily,   R     Question:  Specimen collection method  Answer:  Lab=Lab collect   09/05/23 0634           Antimicrobials/Microbiology: Anti-infectives (From admission, onward)    Start     Dose/Rate Route Frequency Ordered Stop   09/06/23 1030  piperacillin -tazobactam (ZOSYN ) IVPB 3.375 g        3.375 g 12.5 mL/hr over 240 Minutes Intravenous Every 8 hours 09/06/23 1013     09/04/23 1600  vancomycin  (VANCOCIN ) IVPB 1000 mg/200 mL premix  Status:  Discontinued        1,000 mg 200 mL/hr over 60 Minutes Intravenous Every 36 hours  09/03/23 0655 09/04/23 0815   09/04/23 1000  ceFAZolin  (ANCEF ) IVPB 2g/100 mL premix  Status:  Discontinued        2 g 200 mL/hr over 30 Minutes Intravenous Every 8 hours 09/04/23 0815 09/04/23 0901   09/04/23 1000  ceFAZolin  (ANCEF ) IVPB 2g/100 mL premix  Status:  Discontinued  2 g 200 mL/hr over 30 Minutes Intravenous Every 12 hours 09/04/23 0901 09/06/23 1004   09/03/23 1600  metroNIDAZOLE  (FLAGYL ) IVPB 500 mg  Status:  Discontinued        500 mg 100 mL/hr over 60 Minutes Intravenous Every 12 hours 09/03/23 0643 09/04/23 0815   09/03/23 1500  ceFEPIme  (MAXIPIME ) 2 g in sodium chloride  0.9 % 100 mL IVPB  Status:  Discontinued        2 g 200 mL/hr over 30 Minutes Intravenous Every 12 hours 09/03/23 0655 09/04/23 0815   09/03/23 0400  metroNIDAZOLE  (FLAGYL ) IVPB 500 mg        500 mg 100 mL/hr over 60 Minutes Intravenous  Once 09/03/23 0358 09/03/23 0535   09/03/23 0330  vancomycin  (VANCOREADY) IVPB 1750 mg/350 mL        1,750 mg 175 mL/hr over 120 Minutes Intravenous  Once 09/03/23 0325 09/03/23 0608   09/03/23 0300  ceFEPIme  (MAXIPIME ) 2 g in sodium chloride  0.9 % 100 mL IVPB        2 g 200 mL/hr over 30 Minutes Intravenous  Once 09/03/23 0251 09/03/23 0336         Component Value Date/Time   SDES ABSCESS 09/07/2023 1548   SPECREQUEST ABD 09/07/2023 1548   CULT  09/07/2023 1548    NO GROWTH 3 DAYS NO ANAEROBES ISOLATED; CULTURE IN PROGRESS FOR 5 DAYS Performed at [facility], [address] [place], [place] [address]    REPTSTATUS PENDING 09/07/2023 1548    Procedures:  Medications reviewed:  Scheduled Meds:  amiodarone   200 mg Oral BID   atorvastatin   80 mg Oral Daily   Chlorhexidine  Gluconate Cloth  6 each Topical Daily   fiber  1 packet Oral BID   pantoprazole   40 mg Oral Daily   Continuous Infusions:  heparin  1,400 Units/hr (09/10/23 0201)   lactated ringers  75 mL/hr at 09/10/23 0951   piperacillin -tazobactam (ZOSYN )  IV 12.5 mL/hr at 09/10/23 0201     Dorothy Rape, MD [facility] 09/10/2023, 12:56 PM

## 2023-09-10 NOTE — Progress Notes (Signed)
 [facility] Progress Note  Subjective:  Seen in room No c/o's  UOP 550 today I/O yest + 2087 cc   Vitals:   09/09/23 2001 09/10/23 0500 09/10/23 0607 09/10/23 1342  BP: 128/73  (!) 148/86 (!) 146/77  Pulse: 96  (!) 103 (!) 105  Resp: 20     Temp: 98 F (36.7 C)  98.4 F (36.9 C) 98 F (36.7 C)  TempSrc: Oral  Oral Oral  SpO2: 98%  96% 98%  Weight:  86.4 kg    Height:        Exam: Gen alert, no distress, on RA No jvd or bruits Chest clear bilat to bases RRR no MRG Abd soft ntnd no mass or ascites +bs Ext no LE edema Neuro is alert, Ox 3 , nf      Renal-related home meds: Lopressor  50 twice daily Others: PPI, statin, amiodarone , Oxy IR, Eliquis    Date                             Creat               eGFR (ml/min) 2010- 2017                  0.88- 1.00 2019                            1.28- 2.55 03/24/21- 1/17/231.81- 2.72 May 21, 2021               3.38 2/25- 06/06/21               15.6 >> 4.40    AKI episode May- oct 2023             1.69- 2.56 Feb 2024                     2.05- 2.23        24- 27 ml/min 05/05/23                        1.97                 28 ml/min         5/01- 08/28/23               8.01 >> 2.19    AKI, 5 >> 25 ml/min 5/29- 09/05/23               1.90- 2.17        Early this admit 6/01                             2.45 6/02                             2.92 6/03                             2.51 6/04                             3.66                 Today  Assessment/ Plan: AKI on CKD 4: b/l creatinine 1.9- 2.2 from [place] and early may 2025, eGFR 25- 28ml/min. Creat here was 1.9 on admit, now over last 4 days creat has been rising up to 2.5 yest and 3.6 today. . Renal US  no hydronephrosis. UA negative. By exam pt looked dry, lasix  was stopped on 6/01. BP's were relatively down as well and high-output ostomy resulted in I/O of net negative 9 L yesterday on day of consult. Ostomy output is slowing down now, but pt still needs IVF's to  return to euvolemia. Gave 2L bolus yest and started IVF at 75 cc/hr. Creat down today 3.1. Rebolus 1.5 L tonight, cont IVF 75 /hr. Will follow.  Ostomy leakage: resolved Recent SBO s/p exlap w/ LOA/resection of SB w/ ileostomy revision 5/15 Volume: pt looks dry on exam High-output ostomy           Larry Poag MD  [facility] 09/10/2023, 3:53 PM  Recent Labs  Lab 09/08/23 0409 09/09/23 0358 09/10/23 0409  HGB 8.7* 8.8* 8.2*  ALBUMIN  3.2* 3.1*  --   CALCIUM  8.6* 8.9 8.7*  PHOS 2.9 3.4  --   CREATININE 2.51* 3.66* 3.15*  K 3.6 3.9 4.1   Recent Labs  Lab 09/05/23 0438  IRON 20*  TIBC 174*  FERRITIN 372*   Inpatient medications:  amiodarone   200 mg Oral BID   apixaban   5 mg Oral BID   atorvastatin   80 mg Oral Daily   Chlorhexidine  Gluconate Cloth  6 each Topical Daily   fiber  1 packet Oral BID   pantoprazole   40 mg Oral Daily    heparin  1,400 Units/hr (09/10/23 1534)   lactated ringers      sodium chloride      acetaminophen  **OR** acetaminophen , melatonin, naLOXone  (NARCAN )  injection, mouth rinse, oxyCODONE 

## 2023-09-10 NOTE — Progress Notes (Addendum)
 Subjective: CC: Reports some L lateral abdominal pain yesterday. No abdominal pain today. Tolerating soft diet without n/v. Having ostomy output, 300cc/24 hours. Required I/O cath this am.   Afebrile. HR 103. No hypotension. WBC wnl.   Objective: Vital signs in last 24 hours: Temp:  [97.6 F (36.4 C)-98.4 F (36.9 C)] 98.4 F (36.9 C) (06/05 0607) Pulse Rate:  [96-104] 103 (06/05 0607) Resp:  [18-20] 20 (06/04 2001) BP: (122-148)/(62-86) 148/86 (06/05 0607) SpO2:  [96 %-98 %] 96 % (06/05 0607) Weight:  [86.4 kg] 86.4 kg (06/05 0500) Last BM Date : 09/10/23  Intake/Output from previous day: 06/04 0701 - 06/05 0700 In: 3312 [P.O.:360; I.V.:1485; IV Piggyback:1467.1] Out: 300 [Stool:300] Intake/Output this shift: Total I/O In: -  Out: 550 [Urine:550]  PE: Gen:  Alert, NAD, pleasant Abd: Soft, ND, NT, midline incision cdi. Stoma viable with brown liquid stool in bag.   Lab Results:  Recent Labs    09/09/23 0358 09/10/23 0409  WBC 9.2 7.3  HGB 8.8* 8.2*  HCT 28.5* 27.8*  PLT 292 278   BMET Recent Labs    09/09/23 0358 09/10/23 0409  NA 137 137  K 3.9 4.1  CL 102 106  CO2 21* 19*  GLUCOSE 105* 94  BUN 36* 32*  CREATININE 3.66* 3.15*  CALCIUM  8.9 8.7*   PT/INR No results for input(s): "LABPROT", "INR" in the last 72 hours. CMP     Component Value Date/Time   NA 137 09/10/2023 0409   NA 140 05/05/2023 0830   K 4.1 09/10/2023 0409   CL 106 09/10/2023 0409   CO2 19 (L) 09/10/2023 0409   GLUCOSE 94 09/10/2023 0409   BUN 32 (H) 09/10/2023 0409   BUN 31 (H) 05/05/2023 0830   CREATININE 3.15 (H) 09/10/2023 0409   CREATININE 1.69 (H) 08/21/2017 0935   CALCIUM  8.7 (L) 09/10/2023 0409   PROT 7.0 09/09/2023 0358   PROT 7.0 05/05/2023 0830   ALBUMIN  3.1 (L) 09/09/2023 0358   ALBUMIN  4.3 05/05/2023 0830   AST 15 09/09/2023 0358   ALT <5 09/09/2023 0358   ALKPHOS 66 09/09/2023 0358   BILITOT 1.1 09/09/2023 0358   BILITOT 0.4 05/05/2023 0830    GFRNONAA 16 (L) 09/10/2023 0409   GFRNONAA 33 (L) 08/21/2017 0935   GFRAA 53 (L) 10/07/2017 0720   GFRAA 39 (L) 08/21/2017 0935   Lipase     Component Value Date/Time   LIPASE 31 08/06/2023 2148    Studies/Results: US  RENAL Result Date: 09/09/2023 CLINICAL DATA:  [id number] Acute renal failure superimposed on stage 4 chronic kidney disease (HCC) [id number] EXAM: RENAL / URINARY TRACT ULTRASOUND COMPLETE COMPARISON:  September 06, 2023 FINDINGS: Right Kidney: Renal measurements: 9.6 x 4.5 x 4.7 cm = volume: 105 mL. Cortical thinning with increased echogenicity. No mass. No hydronephrosis or nephrolithiasis. Left Kidney: Renal measurements: 9.4 x 5.6 x 4.6 cm = volume: 127 mL. Cortical thinning with increased echogenicity. No mass. No hydronephrosis or nephrolithiasis. Bladder: Circumferential wall thickening of the urinary bladder. Other: None. IMPRESSION: 1. Technically difficult exam due to patient's body habitus and overlying bowel gas. Otherwise, no hydronephrosis or nephrolithiasis. Renal parenchymal changes consistent with chronic medical renal disease. 2. Circumferential wall thickening of the urinary bladder, which may be due to underdistension or acute cystitis. Correlation with urinalysis is recommended. Electronically Signed   By: Rance Burrows M.D.   On: 09/09/2023 17:16    Anti-infectives: Anti-infectives (From admission, onward)    Start  Dose/Rate Route Frequency Ordered Stop   09/06/23 1030  piperacillin -tazobactam (ZOSYN ) IVPB 3.375 g        3.375 g 12.5 mL/hr over 240 Minutes Intravenous Every 8 hours 09/06/23 1013     09/04/23 1600  vancomycin  (VANCOCIN ) IVPB 1000 mg/200 mL premix  Status:  Discontinued        1,000 mg 200 mL/hr over 60 Minutes Intravenous Every 36 hours 09/03/23 0655 09/04/23 0815   09/04/23 1000  ceFAZolin  (ANCEF ) IVPB 2g/100 mL premix  Status:  Discontinued        2 g 200 mL/hr over 30 Minutes Intravenous Every 8 hours 09/04/23 0815 09/04/23 0901   09/04/23  1000  ceFAZolin  (ANCEF ) IVPB 2g/100 mL premix  Status:  Discontinued        2 g 200 mL/hr over 30 Minutes Intravenous Every 12 hours 09/04/23 0901 09/06/23 1004   09/03/23 1600  metroNIDAZOLE  (FLAGYL ) IVPB 500 mg  Status:  Discontinued        500 mg 100 mL/hr over 60 Minutes Intravenous Every 12 hours 09/03/23 0643 09/04/23 0815   09/03/23 1500  ceFEPIme  (MAXIPIME ) 2 g in sodium chloride  0.9 % 100 mL IVPB  Status:  Discontinued        2 g 200 mL/hr over 30 Minutes Intravenous Every 12 hours 09/03/23 0655 09/04/23 0815   09/03/23 0400  metroNIDAZOLE  (FLAGYL ) IVPB 500 mg        500 mg 100 mL/hr over 60 Minutes Intravenous  Once 09/03/23 0358 09/03/23 0535   09/03/23 0330  vancomycin  (VANCOREADY) IVPB 1750 mg/350 mL        1,750 mg 175 mL/hr over 120 Minutes Intravenous  Once 09/03/23 0325 09/03/23 0608   09/03/23 0300  ceFEPIme  (MAXIPIME ) 2 g in sodium chloride  0.9 % 100 mL IVPB        2 g 200 mL/hr over 30 Minutes Intravenous  Once 09/03/23 0251 09/03/23 0336        Assessment/Plan POD 21 - s/p ex lap, SBR, LOA, ileostomy revision, VHR at [facility] by Dr. Collene Dawson on 5/15 for Small bowel obstruction, ventral hernia with incarceration and contained small bowel perforation with mesenteric abscess, parastomal hernia, adhesions  - CT 6/1 w/ wall thickening and inflammation of the small bowel loops leading to ileostomy c/f enteritis; mesenteric edema and interloop fluid without free air, trace fluid in the pelvis, and subcutaneous fluid in RLQ - S/p IR aspiration of abdominal wall fluid collection 6/2 - IR aspiration cx NGTD. Abx per primary - Afebrile. WBC wnl. Now tolerating diet without abdominal pain, n/v, having ostomy output and with benign abdominal exam - All staples removed per notes 6/3 - Ostomy output improved. GI panel neg. Cont fiber and monitor I/O.  - Will review case with attending but suspect she does not need further antibiotics from our standpoint, can restart her DOAC and would  be able to be discharged from a surgical standpoint with follow up as an outpatient with Dr. Collene Dawson soon.  - We will follow with you  FEN - Soft  VTE - SCDs, heparin  gtt ID - Zosyn  (defer to primary, also being covered for possible PNA) Foley - Required I/O this am, bladder scan 6hrs prn if unable to void  ?possible pneumonia/pneumonitis  CHFpEF HLD Hx aortic dissection Obesity CKD IV Afib  I reviewed nursing notes, hospitalist notes, last 24 h vitals and pain scores, last 48 h intake and output, last 24 h labs and trends, and last 24 h imaging results.  LOS: 7 days    Delton Filbert, [facility] Surgery 09/10/2023, 10:40 AM Please see Amion for pager number during day hours 7:00am-4:30pm

## 2023-09-10 NOTE — [place] FL2 (Signed)
 [place]  MEDICAID FL2 LEVEL OF CARE FORM     IDENTIFICATION  Patient Name: Dorothy Harris Birthdate: 1960/09/08 Sex: female Admission Date (Current Location): 09/03/2023  [facility] and [place] Number:  Producer, television/film/video and Address:  [facility],  [address], [place] [address]      Provider Number: [id number]  Attending Physician Name and Address:  Lesa Rape, MD  Relative Name and Phone Number:  [name] (sister) Ph: [phone]    Current Level of Care: Hospital Recommended Level of Care: Skilled Nursing Facility Prior Approval Number:    Date Approved/Denied:   PASRR Number: [id number]  Discharge Plan: SNF    Current Diagnoses: Patient Active Problem List   Diagnosis Date Noted   Acute on chronic heart failure with preserved ejection fraction (HFpEF) (HCC) 09/03/2023   Ileus (HCC) 09/03/2023   Edema of left upper extremity 09/03/2023   QT prolongation 09/03/2023   Sepsis (HCC) 09/03/2023   Persistent atrial fibrillation (HCC) 08/27/2023   Perforated bowel (HCC) 08/20/2023   Mesenteric abscess (HCC) 08/20/2023   Atrial fibrillation with rapid ventricular response (HCC) 08/17/2023   Small bowel obstruction (HCC) 08/07/2023   Abdominal pain 08/07/2023   Nausea & vomiting 08/07/2023   Dehydration 08/07/2023   Elevated troponin 08/07/2023   Obesity, Class I, BMI 30-34.9 08/07/2023   (HFpEF) heart failure with preserved ejection fraction (HCC) 08/07/2023   Ischemic colitis (HCC) 08/07/2023   Ileostomy in place (HCC) 08/07/2023   Parastomal hernia without obstruction or gangrene 08/07/2023   Incisional hernia with obstruction but no gangrene 08/07/2023   Dyspnea on exertion 11/19/2022   Vitamin D  deficiency 05/29/2022   Abdominal aortic aneurysm (AAA) (HCC) 05/08/2022   Iron deficiency 05/08/2022   Acute lumbar back pain 05/08/2022   Anemia of chronic renal failure 03/20/2022   Hospital discharge follow-up 02/20/2022   Anemia  01/29/2022   History of pneumonia 01/29/2022   Hyperglycemia 01/29/2022   Impaired mobility and ADLs 01/29/2022   Incomplete paraplegia (HCC) 01/29/2022   Wound of left groin 01/29/2022   Stage 4 chronic kidney disease (HCC)    Leukocytosis    Leg weakness, bilateral 01/17/2022   Thoracic aortic aneurysm (HCC) 01/15/2022   Annual physical exam 11/05/2021   Depression, major, single episode, complete remission (HCC) 06/21/2021   S/P exploratory laparotomy 06/21/2021   Mass of right breast 06/21/2021   Disability examination 06/21/2021   Pressure injury of skin 06/02/2021   Hyperkalemia 06/01/2021   Acute hyperkalemia 06/01/2021   PAF (paroxysmal atrial fibrillation) (HCC) 05/07/2021   Essential hypertension 05/07/2021   Pure hypercholesterolemia 05/07/2021   Debility 04/15/2021   Acute hypoxemic respiratory failure (HCC) 04/07/2021   Malnutrition of moderate degree 04/03/2021   Encounter for postanesthesia care 04/02/2021   Peritonitis (HCC) 04/02/2021   Endotracheally intubated 04/02/2021   Acute kidney injury superimposed on stage 4 chronic kidney disease (HCC) 04/02/2021   S/P ascending aortic replacement 03/25/2021   Encounter for weaning from ventilator [facility])    Status post surgery 03/24/2021   Tobacco abuse 07/30/2017   History of drug use 07/30/2017   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/2017   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   Chest pain 05/31/2017   Coronary artery disease due to lipid rich plaque    Acute on chronic diastolic CHF (congestive heart failure) (HCC)    Acute coronary syndrome (HCC) 12/23/2015   NSTEMI (non-ST elevated myocardial infarction) (HCC) 12/22/2015    Orientation RESPIRATION BLADDER Height & Weight  Self, Time, Situation, Place  Normal Continent Weight: 190 lb 7.6 oz (86.4 kg) Height:  5\' 7"  (170.2 cm)  BEHAVIORAL SYMPTOMS/MOOD NEUROLOGICAL BOWEL NUTRITION STATUS      Continent Diet (Soft diet)   AMBULATORY STATUS COMMUNICATION OF NEEDS Skin   Extensive Assist Verbally Other (Comment) (Erythema: sacrum; Ecchymosis: bilateral arms & legs)                       Personal Care Assistance Level of Assistance  Bathing, Feeding, Dressing Bathing Assistance: Limited assistance Feeding assistance: Independent Dressing Assistance: Limited assistance     Functional Limitations Info  Sight, Hearing, Speech Sight Info: Adequate Hearing Info: Adequate Speech Info: Adequate    SPECIAL CARE FACTORS FREQUENCY  PT (By licensed PT), OT (By licensed OT)     PT Frequency: 5x's/week OT Frequency: 5x's/week            Contractures Contractures Info: Not present    Additional Factors Info  Code Status, Allergies Code Status Info: DNR Allergies Info: Chlorthalidone            Current Medications (09/10/2023):  This is the current hospital active medication list Current Facility-Administered Medications  Medication Dose Route Frequency Provider Last Rate Last Admin   acetaminophen  (TYLENOL ) tablet 650 mg  650 mg Oral Q6H PRN Rathore, Vasundhra, MD   650 mg at 09/06/23 1040   Or   acetaminophen  (TYLENOL ) suppository 650 mg  650 mg Rectal Q6H PRN Juliette Oh, MD       amiodarone  (PACERONE ) tablet 200 mg  200 mg Oral BID Etter Hermann., MD   200 mg at 09/10/23 7829   atorvastatin  (LIPITOR ) tablet 80 mg  80 mg Oral Daily Rathore, Vasundhra, MD   80 mg at 09/10/23 5621   Chlorhexidine  Gluconate Cloth 2 % PADS 6 each  6 each Topical Daily Etter Hermann., MD   6 each at 09/08/23 1003   fiber (NUTRISOURCE FIBER) 1 packet  1 packet Oral BID Smiley Dung, RPH   1 packet at 09/10/23 0949   heparin  ADULT infusion 100 units/mL (25000 units/250mL)  1,400 Units/hr Intravenous Continuous Scheryl Cushing, RPH 14 mL/hr at 09/10/23 0201 1,400 Units/hr at 09/10/23 0201   lactated ringers  infusion   Intravenous Continuous Chucky Craver, MD 75 mL/hr at 09/10/23 0951 New  Bag at 09/10/23 0951   melatonin tablet 5 mg  5 mg Oral QHS PRN Daniels, James K, NP   5 mg at 09/09/23 2149   naloxone  (NARCAN ) injection 0.4 mg  0.4 mg Intravenous PRN Rathore, Vasundhra, MD       Oral care mouth rinse  15 mL Mouth Rinse PRN Etter Hermann., MD       oxyCODONE  (Oxy IR/ROXICODONE ) immediate release tablet 5 mg  5 mg Oral Q4H PRN Etter Hermann., MD   5 mg at 09/09/23 2149   pantoprazole  (PROTONIX ) EC tablet 40 mg  40 mg Oral Daily Rathore, Vasundhra, MD   40 mg at 09/10/23 3086   piperacillin -tazobactam (ZOSYN ) IVPB 3.375 g  3.375 g Intravenous Q8H Pham, Anh P, RPH 12.5 mL/hr at 09/10/23 0201 Infusion Verify at 09/10/23 0201     Discharge Medications: Please see discharge summary for a list of discharge medications.  Relevant Imaging Results:  Relevant Lab Results:   Additional Information SSN: [id number]  Zenon Hilda, LCSW

## 2023-09-10 NOTE — Plan of Care (Signed)

## 2023-09-10 NOTE — Progress Notes (Signed)
 PHARMACY - ANTICOAGULATION CONSULT NOTE  Pharmacy Consult for Heparin  Indication: atrial fibrillation  Allergies  Allergen Reactions   Chlorthalidone  Other (See Comments)    ELEVATED KIDNEY FUNCTION     Patient Measurements: Height: 5\' 7"  (170.2 cm) Weight: 86.4 kg (190 lb 7.6 oz) IBW/kg (Calculated) : 61.6 HEPARIN  DW (KG): 84.8  Vital Signs: Temp: 98.4 F (36.9 C) (06/05 0607) Temp Source: Oral (06/05 0607) BP: 148/86 (06/05 0607) Pulse Rate: 103 (06/05 0607)  Labs: Recent Labs    09/08/23 0409 09/09/23 0358 09/10/23 0409  HGB 8.7* 8.8* 8.2*  HCT 28.5* 28.5* 27.8*  PLT 337 292 278  HEPARINUNFRC 0.31 0.34 0.37  CREATININE 2.51* 3.66* 3.15*    Estimated Creatinine Clearance: 20.9 mL/min (A) (by C-G formula based on SCr of 3.15 mg/dL (H)).   Medical History: 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HCC)    Anemia    Chronic kidney disease    Essential hypertension 05/07/2021   GERD (gastroesophageal reflux disease)    Hernia, epigastric    Hypertension    PAF (paroxysmal atrial fibrillation) (HCC) 05/07/2021   Pure hypercholesterolemia 05/07/2021    Assessment:  AC/Heme: hep gtt for afib,  doppler negative - Takes Eliquis  PTA for afib (LD 5/28 PM) - Acute Superficial Vein Thrombosis of L Cephalic Vein : supportive care only - Iron panel low - Hgb 8.2, Hct 27.8, Plts 278: all trending down - HL 0.37 in goal  Goal of Therapy:  Heparin  level 0.3-0.7 units/ml Monitor platelets by anticoagulation protocol: Yes   Plan:  - Con't IV heparin  1400 units/hr.  - Heparin  level and CBC daily   Valera Gaster, PharmD Student  Chaye Misch David Escort 09/10/2023,7:25 AM

## 2023-09-10 NOTE — TOC Progression Note (Signed)
 Transition of Care [facility]) - Progression Note   Patient Details  Name: Dorothy Harris MRN: [id number] Date of Birth: 1961-01-19  Transition of Care [facility]) CM/SW Contact  Zenon Hilda, LCSW Phone Number: 09/10/2023, 2:24 PM  Clinical Narrative: CSW received voicemail from Jaya with [employer] [phone]). CSW attempted to follow up with [place], but was unable to reach her so voicemail was left. CSW completed FL2 and expanded SNF search area to include the surrounding counties. There are 19 additional SNF denials due to patient's insurance being out of network. TOC to follow.  Expected Discharge Plan: Skilled Nursing Facility Barriers to Discharge: Continued Medical Work up, English as a second language teacher  Expected Discharge Plan and Services In-house Referral: Clinical Social Work   Post Acute Care Choice: NA Living arrangements for the past 2 months: Skilled Nursing Facility [name])                 DME Arranged: N/A DME Agency: NA                   Social Determinants of Health (SDOH) Interventions SDOH Screenings   Food Insecurity: Food Insecurity Present (09/03/2023)  Housing: High Risk (09/03/2023)  Transportation Needs: No Transportation Needs (09/03/2023)  Utilities: Not At Risk (09/03/2023)  Alcohol Screen: Low Risk  (05/07/2021)  Depression (PHQ2-9): Medium Risk (05/05/2023)  Financial Resource Strain: Low Risk  (05/07/2021)  Physical Activity: Inactive (05/07/2021)  Tobacco Use: High Risk (09/05/2023)    Readmission Risk Interventions    09/03/2023   10:08 AM 08/11/2023    8:27 AM 08/07/2023    7:29 AM  Readmission Risk Prevention Plan  Medication Screening   Complete  Transportation Screening Complete Complete Complete  PCP or Specialist Appt within 5-7 Days Complete    Home Care Screening Complete    Medication Review (RN CM) Complete    HRI or Home Care Consult  Complete   Social Work Consult for Recovery Care Planning/Counseling  Complete   Palliative Care  Screening  Not Applicable   Medication Review Oceanographer)  Complete

## 2023-09-11 DIAGNOSIS — I5033 Acute on chronic diastolic (congestive) heart failure: Secondary | ICD-10-CM | POA: Diagnosis not present

## 2023-09-11 LAB — BASIC METABOLIC PANEL WITH GFR
Anion gap: 9 (ref 5–15)
BUN: 27 mg/dL — ABNORMAL HIGH (ref 8–23)
CO2: 19 mmol/L — ABNORMAL LOW (ref 22–32)
Calcium: 8.7 mg/dL — ABNORMAL LOW (ref 8.9–10.3)
Chloride: 112 mmol/L — ABNORMAL HIGH (ref 98–111)
Creatinine, Ser: 2.61 mg/dL — ABNORMAL HIGH (ref 0.44–1.00)
GFR, Estimated: 20 mL/min — ABNORMAL LOW (ref >60)
Glucose, Bld: 111 mg/dL — ABNORMAL HIGH (ref 70–99)
Potassium: 3.6 mmol/L (ref 3.5–5.1)
Sodium: 140 mmol/L (ref 135–145)

## 2023-09-11 MED ORDER — DICLOFENAC SODIUM 1 % EX GEL
2.0000 g | Freq: Two times a day (BID) | CUTANEOUS | Status: DC
  Administered 2023-09-11 – 2023-09-17 (×13): 2 g via TOPICAL
  Filled 2023-09-11: qty 100

## 2023-09-11 MED ORDER — LOPERAMIDE HCL 2 MG PO CAPS
2.0000 mg | ORAL_CAPSULE | Freq: Three times a day (TID) | ORAL | Status: DC
  Administered 2023-09-11 – 2023-09-17 (×20): 2 mg via ORAL
  Filled 2023-09-11 (×20): qty 1

## 2023-09-11 NOTE — TOC Progression Note (Signed)
 Transition of Care [facility]) - Progression Note   Patient Details  Name: Dorothy Harris MRN: [id number] Date of Birth: [birth year]  Transition of Care [facility]) CM/SW Contact  Zenon Hilda, LCSW Phone Number: 09/11/2023, 9:42 AM  Clinical Narrative: Patient received the following bed offers:  [facility] [address] [place], [place] [address] [phone] Overall rating ? Much below average  [facility] [address] [place], [place] [address] [phone] Overall rating ? Much below average  [facility] [address] [place], [place] [address] [phone] Overall rating ?? Below average  [facility] [address] [place], [place] [address] [phone] Overall rating ?? Below average  CSW provided patient with the list of bed offers and recommended that she discuss the offers with her sisters. TOC awaiting bed choice.  Expected Discharge Plan: Skilled Nursing Facility Barriers to Discharge: Continued Medical Work up, English as a second language teacher  Expected Discharge Plan and Services In-house Referral: Clinical Social Work Post Acute Care Choice: NA Living arrangements for the past 2 months: Skilled Nursing Facility [name])             DME Arranged: N/A DME Agency: NA  Social Determinants of Health (SDOH) Interventions SDOH Screenings   Food Insecurity: Food Insecurity Present (09/03/2023)  Housing: High Risk (09/03/2023)  Transportation Needs: No Transportation Needs (09/03/2023)  Utilities: Not At Risk (09/03/2023)  Alcohol Screen: Low Risk  (05/07/2021)  Depression (PHQ2-9): Medium Risk (05/05/2023)  Financial Resource Strain: Low Risk  (05/07/2021)  Physical Activity: Inactive (05/07/2021)  Tobacco Use: High Risk (09/05/2023)   Readmission Risk Interventions    09/03/2023   10:08 AM 08/11/2023    8:27 AM 08/07/2023    7:29 AM  Readmission Risk Prevention Plan  Medication Screening   Complete  Transportation  Screening Complete Complete Complete  PCP or Specialist Appt within 5-7 Days Complete    Home Care Screening Complete    Medication Review (RN CM) Complete    HRI or Home Care Consult  Complete   Social Work Consult for Recovery Care Planning/Counseling  Complete   Palliative Care Screening  Not Applicable   Medication Review Oceanographer)  Complete

## 2023-09-11 NOTE — Progress Notes (Addendum)
 Subjective: CC: No complaints of abdominal pain today. Tolerating soft diet without n/v. Having less ostomy output with metamucil. Midline incision remains C/D/I w/o signs of infx.    Objective: Vital signs in last 24 hours: Temp:  [98 F (36.7 C)-98.7 F (37.1 C)] 98.7 F (37.1 C) (06/06 0621) Pulse Rate:  [95-105] 95 (06/06 0621) Resp:  [20] 20 (06/06 0621) BP: (143-149)/(77-89) 143/89 (06/06 0621) SpO2:  [95 %-98 %] 95 % (06/06 0621) Weight:  [87.1 kg] 87.1 kg (06/06 0500) Last BM Date : 09/10/23  Intake/Output from previous day: 06/05 0701 - 06/06 0700 In: 3010.4 [P.O.:480; I.V.:2303.8; IV Piggyback:226.7] Out: 2100 [Urine:550; Stool:1550] Intake/Output this shift: Total I/O In: 200 [I.V.:200] Out: -   PE: Gen:  Alert, NAD Abd: Soft, ND, NT, midline incision cdi. Stoma viable with small amt of brown liquid stool in bag.   Lab Results:  Recent Labs    09/09/23 0358 09/10/23 0409  WBC 9.2 7.3  HGB 8.8* 8.2*  HCT 28.5* 27.8*  PLT 292 278   BMET Recent Labs    09/10/23 0409 09/11/23 0340  NA 137 140  K 4.1 3.6  CL 106 112*  CO2 19* 19*  GLUCOSE 94 111*  BUN 32* 27*  CREATININE 3.15* 2.61*  CALCIUM  8.7* 8.7*   PT/INR No results for input(s): "LABPROT", "INR" in the last 72 hours. CMP     Component Value Date/Time   NA 140 09/11/2023 0340   NA 140 05/05/2023 0830   K 3.6 09/11/2023 0340   CL 112 (H) 09/11/2023 0340   CO2 19 (L) 09/11/2023 0340   GLUCOSE 111 (H) 09/11/2023 0340   BUN 27 (H) 09/11/2023 0340   BUN 31 (H) 05/05/2023 0830   CREATININE 2.61 (H) 09/11/2023 0340   CREATININE 1.69 (H) 08/21/2017 0935   CALCIUM  8.7 (L) 09/11/2023 0340   PROT 7.0 09/09/2023 0358   PROT 7.0 05/05/2023 0830   ALBUMIN  3.1 (L) 09/09/2023 0358   ALBUMIN  4.3 05/05/2023 0830   AST 15 09/09/2023 0358   ALT <5 09/09/2023 0358   ALKPHOS 66 09/09/2023 0358   BILITOT 1.1 09/09/2023 0358   BILITOT 0.4 05/05/2023 0830   GFRNONAA 20 (L) 09/11/2023 0340    GFRNONAA 33 (L) 08/21/2017 0935   GFRAA 53 (L) 10/07/2017 0720   GFRAA 39 (L) 08/21/2017 0935   Lipase     Component Value Date/Time   LIPASE 31 08/06/2023 2148    Studies/Results: US  RENAL Result Date: 09/09/2023 CLINICAL DATA:  [id number] Acute renal failure superimposed on stage 4 chronic kidney disease (HCC) 9811914 EXAM: RENAL / URINARY TRACT ULTRASOUND COMPLETE COMPARISON:  September 06, 2023 FINDINGS: Right Kidney: Renal measurements: 9.6 x 4.5 x 4.7 cm = volume: 105 mL. Cortical thinning with increased echogenicity. No mass. No hydronephrosis or nephrolithiasis. Left Kidney: Renal measurements: 9.4 x 5.6 x 4.6 cm = volume: 127 mL. Cortical thinning with increased echogenicity. No mass. No hydronephrosis or nephrolithiasis. Bladder: Circumferential wall thickening of the urinary bladder. Other: None. IMPRESSION: 1. Technically difficult exam due to patient's body habitus and overlying bowel gas. Otherwise, no hydronephrosis or nephrolithiasis. Renal parenchymal changes consistent with chronic medical renal disease. 2. Circumferential wall thickening of the urinary bladder, which may be due to underdistension or acute cystitis. Correlation with urinalysis is recommended. Electronically Signed   By: Rance Burrows M.D.   On: 09/09/2023 17:16    Anti-infectives: Anti-infectives (From admission, onward)    Start  Dose/Rate Route Frequency Ordered Stop   09/06/23 1030  piperacillin -tazobactam (ZOSYN ) IVPB 3.375 g  Status:  Discontinued        3.375 g 12.5 mL/hr over 240 Minutes Intravenous Every 8 hours 09/06/23 1013 09/10/23 1355   09/04/23 1600  vancomycin  (VANCOCIN ) IVPB 1000 mg/200 mL premix  Status:  Discontinued        1,000 mg 200 mL/hr over 60 Minutes Intravenous Every 36 hours 09/03/23 0655 09/04/23 0815   09/04/23 1000  ceFAZolin  (ANCEF ) IVPB 2g/100 mL premix  Status:  Discontinued        2 g 200 mL/hr over 30 Minutes Intravenous Every 8 hours 09/04/23 0815 09/04/23 0901   09/04/23  1000  ceFAZolin  (ANCEF ) IVPB 2g/100 mL premix  Status:  Discontinued        2 g 200 mL/hr over 30 Minutes Intravenous Every 12 hours 09/04/23 0901 09/06/23 1004   09/03/23 1600  metroNIDAZOLE  (FLAGYL ) IVPB 500 mg  Status:  Discontinued        500 mg 100 mL/hr over 60 Minutes Intravenous Every 12 hours 09/03/23 0643 09/04/23 0815   09/03/23 1500  ceFEPIme  (MAXIPIME ) 2 g in sodium chloride  0.9 % 100 mL IVPB  Status:  Discontinued        2 g 200 mL/hr over 30 Minutes Intravenous Every 12 hours 09/03/23 0655 09/04/23 0815   09/03/23 0400  metroNIDAZOLE  (FLAGYL ) IVPB 500 mg        500 mg 100 mL/hr over 60 Minutes Intravenous  Once 09/03/23 0358 09/03/23 0535   09/03/23 0330  vancomycin  (VANCOREADY) IVPB 1750 mg/350 mL        1,750 mg 175 mL/hr over 120 Minutes Intravenous  Once 09/03/23 0325 09/03/23 0608   09/03/23 0300  ceFEPIme  (MAXIPIME ) 2 g in sodium chloride  0.9 % 100 mL IVPB        2 g 200 mL/hr over 30 Minutes Intravenous  Once 09/03/23 0251 09/03/23 0336        Assessment/Plan POD 21 - s/p ex lap, SBR, LOA, ileostomy revision, VHR at [facility] by Dr. Collene Dawson on 5/15 for Small bowel obstruction, ventral hernia with incarceration and contained small bowel perforation with mesenteric abscess, parastomal hernia, adhesions  - CT 6/1 w/ wall thickening and inflammation of the small bowel loops leading to ileostomy c/f enteritis; mesenteric edema and interloop fluid without free air, trace fluid in the pelvis, and subcutaneous fluid in RLQ - S/p IR aspiration of abdominal wall fluid collection 6/2 - IR aspiration cx NGTD. Abx per primary - Afebrile. WBC wnl. Now tolerating diet without abdominal pain, n/v, having ostomy output and with benign abdominal exam - All staples removed per notes 6/3 - Ostomy output improved. GI panel neg. Cont fiber and monitor I/O, add Imodium. - WBC count normalized, does not need further antibiotics from general surgery standpoint, can restart her DOAC and would be  able to be discharged from a surgical standpoint with follow up as an outpatient with Dr. Collene Dawson soon.  - We will follow with you  FEN - Soft diet, on metamucil and will start imodium to help slow down BM.  VTE - SCDs, heparin  gtt ID - Zosyn  dc'd on 6/5 Foley - In place, 650 ml of urine output in last 24 hrs  ?possible pneumonia/pneumonitis  CHFpEF HLD Hx aortic dissection Obesity CKD IV Afib  I reviewed nursing notes, hospitalist notes, last 24 h vitals and pain scores, last 48 h intake and output, last 24 h labs and trends,  and last 24 h imaging results.   LOS: 8 days    Mirta Ammon, [facility] 09/11/2023, 9:52 AM Please see Amion for pager number during day hours 7:00am-4:30pm

## 2023-09-11 NOTE — Progress Notes (Signed)
 Occupational Therapy Treatment Patient Details Name: Dorothy Harris MRN: [id number] DOB: December 05, 1960 Today's Date: 09/11/2023   History of present illness Dorothy Harris is a 63 yr old female recently admitted to [facility] from 5/1-5/24 for high-grade SBO status post exploratory laparotomy with lysis of adhesions and resection of small bowel with end ileostomy revision on 08/20/23.  Also treated for AKI on CKD stage IV, due to volume depletion/dehydration treated with IV fluids and acute anemia treated with a total of 3 units PRBCs.  Started on oral metoprolol  for A-fib and Eliquis  was resumed on discharge and was discharged to SNF. ED presentation with leaking iliostomy bag. Re-admitted to [facility] on 09/03/23 due to CHF, ARF, and pulmonary edema.   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   OT comments  The pt was seen for progression of functional activity and general functional strengthening. She was assisted into sitting edge of bed where she required min assist for upper body dressing. She subsequently required min assist to stand using a RW, as well as to step-pivot to the bedside chair. She denied having pain, however reported having chronic discomfort in her foot. She will continue to benefit from further OT services to address the below deficits (see OT problem list). Continue OT plan of care. Patient will benefit from continued inpatient follow up therapy, <3 hours/day.       If plan is discharge home, recommend the following:  A little help with walking and/or transfers;A lot of help with bathing/dressing/bathroom;Assistance with cooking/housework   Equipment Recommendations  Other (comment) (defer to next level of care)    Recommendations for Other Services      Precautions / Restrictions  Precautions Precautions: Fall Precaution/Restrictions Comments: ostomy Restrictions Weight Bearing Restrictions Per Provider Order: No       Mobility Bed Mobility Overal bed mobility: Needs Assistance Bed Mobility: Supine to Sit     Supine to sit: HOB elevated, Used rails, Min assist          Transfers Overall transfer level: Needs assistance Equipment used: Rolling walker (2 wheels) Transfers: Sit to/from Stand, Bed to chair/wheelchair/BSC Sit to Stand: Min assist                     ADL either performed or assessed with clinical judgement   ADL Overall ADL's : Needs assistance/impaired                 Upper Body Dressing : Minimal assistance;Sitting Upper Body Dressing Details (indicate cue type and reason): The pt doffed a hospital gown then donned another clean one in sitting edge of bed. Lower Body Dressing: Moderate assistance;Sitting/lateral leans Lower Body Dressing Details (indicate cue type and reason): For sock management seated EOB.                     Communication Communication Communication: No apparent difficulties   Cognition Arousal: Alert Behavior During Therapy: WFL for tasks assessed/performed Cognition: No apparent impairments          Following commands: Intact                      Pertinent Vitals/ Pain       Pain Assessment Pain Assessment: No/denies pain Pain Location: She denied having pain, however reported having chronic discomfort in her  foot. Pain Intervention(s): Monitored during session, Patient requesting pain meds-RN notified   Frequency  Min 2X/week        Progress Toward Goals  OT Goals(current goals can now be found in the care plan section)  Progress towards OT goals: Progressing toward goals  Acute Rehab OT Goals OT Goal Formulation: With patient Time For Goal Achievement: 09/18/23 Potential to Achieve Goals: Good  Plan         AM-PAC OT "6 Clicks" Daily Activity      Outcome Measure   Help from another person eating meals?: None Help from another person taking care of personal grooming?: A Little Help from another person toileting, which includes using toliet, bedpan, or urinal?: A Lot Help from another person bathing (including washing, rinsing, drying)?: A Lot Help from another person to put on and taking off regular upper body clothing?: A Little Help from another person to put on and taking off regular lower body clothing?: A Lot 6 Click Score: 16    End of Session Equipment Utilized During Treatment: Rolling walker (2 wheels)  OT Visit Diagnosis: Unsteadiness on feet (R26.81);Muscle weakness (generalized) (M62.81);Pain   Activity Tolerance Patient tolerated treatment well   Patient Left in chair;with call bell/phone within reach   Nurse Communication Other (comment) (nurse cleared pt for participation in the session)        Time: 1010-1022 OT Time Calculation (min): 12 min  Charges: OT General Charges $OT Visit: 1 Visit OT Treatments $Therapeutic Activity: 8-22 mins     Sheralyn Dies, OTR/L 09/11/2023, 1:20 PM

## 2023-09-11 NOTE — Progress Notes (Signed)
 [facility] Progress Note  Subjective:  Seen in room I/O yest + 645 cc UOP 450 cc  Vitals:   09/11/23 0500 09/11/23 0621 09/11/23 0621 09/11/23 1247  BP:  (!) 143/89 (!) 143/89 (!) 164/100  Pulse:  99 95 100  Resp:  20 20   Temp:  98.7 F (37.1 C) 98.7 F (37.1 C) 98.1 F (36.7 C)  TempSrc:  Oral Oral Oral  SpO2:  95% 95% 96%  Weight: 87.1 kg     Height:        Exam: Gen alert, no distress, on RA No jvd or bruits Chest clear bilat to bases RRR no MRG Abd soft ntnd no mass or ascites +bs Ext no LE edema Neuro is alert, Ox 3 , nf      Renal-related home meds: Lopressor  50 twice daily Others: PPI, statin, amiodarone , Oxy IR, Eliquis    Date                             Creat               eGFR (ml/min) 2010- 2017                  0.88- 1.00 2019                            1.28- 2.55 03/24/21- 1/17/231.81- 2.72 May 21, 2021               3.38 2/25- 06/06/21               15.6 >> 4.40    AKI episode May- oct 2023             1.69- 2.56 Feb 2024                     2.05- 2.23        24- 27 ml/min 05/05/23                        1.97                 28 ml/min         5/01- 08/28/23               8.01 >> 2.19    AKI, 5 >> 25 ml/min 5/29- 09/05/23               1.90- 2.17        Early this admit 6/01                             2.45 6/02                             2.92 6/03                             2.51 6/04                             3.66                 Today        Assessment/ Plan: AKI  on CKD 4: b/l creatinine 1.9- 2.2 from [place] and early may 2025, eGFR 25- 68ml/min. Creat here was 1.9 on admit, now over last 4 days creat has been rising up to 2.5 yest and 3.6 today. . Renal US  no hydronephrosis. UA negative. By exam pt looked dry, lasix  was stopped on 6/01. I/O's were in deficit of  9 L on day of consult. AKI due to vol depletion related to high-output ostomy. Pt given 2L then 1.5 L bolus + IVF 75 /hr over last 2 days. I/O deficit down to 5 L.  Creat  down today at 2.6 (peak 3.6).  We will have the pt contacted to set up f/u for CKD w/ her renal MD Dr Irene Mannheim in 4-6 wks. Have d/w pt and she is agreeable to this. No other suggestions. Will sign off.  Recent SBO: s/p exlap w/ LOA/resection of SB w/ ileostomy revision 5/15 Volume: pt was dry on exam initially, looking somewhat better now High-output ostomy           Larry Poag MD  [facility] 09/11/2023, 2:11 PM  Recent Labs  Lab 09/08/23 0409 09/09/23 0358 09/10/23 0409 09/11/23 0340  HGB 8.7* 8.8* 8.2*  --   ALBUMIN  3.2* 3.1*  --   --   CALCIUM  8.6* 8.9 8.7* 8.7*  PHOS 2.9 3.4  --   --   CREATININE 2.51* 3.66* 3.15* 2.61*  K 3.6 3.9 4.1 3.6   Recent Labs  Lab 09/05/23 0438  IRON 20*  TIBC 174*  FERRITIN 372*   Inpatient medications:  amiodarone   200 mg Oral BID   apixaban   5 mg Oral BID   atorvastatin   80 mg Oral Daily   Chlorhexidine  Gluconate Cloth  6 each Topical Daily   diclofenac  Sodium  2 g Topical BID   fiber  1 packet Oral BID   loperamide  2 mg Oral TID   pantoprazole   40 mg Oral Daily    lactated ringers  75 mL/hr at 09/11/23 0840   acetaminophen  **OR** acetaminophen , naLOXone  (NARCAN )  injection, mouth rinse, oxyCODONE 

## 2023-09-11 NOTE — Progress Notes (Signed)
 PT Cancellation Note  Patient Details Name: JEANEAN HOLLETT MRN: [id number] DOB: [birth year]   Cancelled Treatment:     Pt was back in bed Max c/o fatigue.  Was OOB in recliner earlier.  LPT has rec SNF.  Rehab team to continue to follow and attempt to see another day.  Bess Broody  PTA Acute  Rehabilitation Services Office M-F          [phone]

## 2023-09-11 NOTE — Progress Notes (Signed)
 PROGRESS NOTE Dorothy Harris  NWG:[id number] DOB: Aug 16, 1960 DOA: 09/03/2023 PCP: [name], MD  Brief Narrative/Hospital Course: 63 y.o. 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  Recently admitted to [facility] from 5/1-5/24 for high-grade SBO status post ex lap with lysis of adhesions and resection of small bowel with end ileostomy revision on 5/15.  Also treated for AKI on CKD stage IV due to volume depletion/dehydration treated with IV fluids and acute anemia treated with a total of 3 units PRBCs. Started on oral metoprolol  for A-fib and Eliquis  was resumed and discharged to SNF. who presented to the ED from SNF for evaluation of leaking ileostomy bag and concern for sepsis on admission with imaging notable for multiple abdominal fluid collections and volume overload.   Surgery initially did not think fluid collections infected.  IR consulted for aspiration on 6/2.  Surgery still following.    Subjective: Seen and examined  She feels well ileostomy output continues to improve , on room air afebrile  No new complaints Labs shows creatinine nicely improving to 2.6  Assessment and Plan:  Multiple abdominal fluid collections concerning for abscess vs seromas/hematomas Ileus versus low-grade SBO Ostomy leakage/with high output-nonspecific enteritis recent SBO s/p ex-lap with LOA/Resection of SB w/h end ileostomy revision on 5/15: Treating for mild cellulitis around incision AND fluid collection concerning- had repeat CT 6/1 with new wall thickening and inflammation of small bowel loops leading up to the RLQ ileostomy c/w nonspecific enteritis  and Increasing mesenteric edema and interloop fluid.  Trace free fluid in the pelvis. Stable areas of internal fluid in  the RLQ. s/p US -guided right abdomen wall fluid collection aspiration-so far negative culture> per surgery antibiotic discontinued no concern for infection. Staples removed  Diet has been advanced continue fiber for high output, GI pathogen panel negative. Continue diet as tolerated at this time no further plan   Acute on chronic HFpEF Acute hypoxemic respiratory failure Pulmonary Edema Anasarca Volume Overload: Presenting with fluid overload bilateral leg edema GGO on right middle and lower lobes on imaging Recent weight 86 kg on 5/4 on 5/29 103 KG> diuresed  w/ net negative and weight improved Diuretics have been held due to AKI, management IV fluids with improving renal function Echo 08/2023 with EF 60-65%, no RWMA. Severe asymmetric LVH.  Diastolic function could not be evaluated.  Trivial MR. Mod calcification of AV.  Monitor Net IO Since Admission: -8,120.76 mL [09/11/23 0951]  Filed Weights   09/09/23 0459 09/10/23 0500 09/11/23 0500  Weight: 84 kg 86.4 kg 87.1 kg    Recent Labs  Lab 09/05/23 0438 09/06/23 0338 09/07/23 0347 09/08/23 0409 09/09/23 0358 09/10/23 0409 09/11/23 0340  BNP  --   --  496.9*  --   --   --   --   BUN 21   < > 26* 31* 36* 32* 27*  CREATININE 2.17*   < > 2.92* 2.51* 3.66* 3.15* 2.61*  K 3.7   < > 3.9 3.6 3.9 4.1 3.6  MG 1.7  --  1.7 1.7 1.8  --   --    < > = values in this interval not displayed.    SIRS Afebrile, but leukocytosis, tachycardic, tachypnic on admit> which has improved. See discussion #1 on empiric antibiotics    Ground-glass opacities in the right  middle and lower lobes: Suspecting due to volume overload rather than infection.  Diuresed.  Negative COVID flu and RSV.  NT patient discontinued  Left upper extremity edema Acute Superficial Vein Thrombosis of L Cephalic Vein Cont supportive care   Elevated Troponin: Mild elevation, without cardiac symptoms chest pain EKG sinus borderline ST elevation in lead III T wave inversions  but similar   QT prolongation QTc 597 on EKG and patient is on amiodarone .  Monitor electrolytes caution with QT prolonging medication QTc has improved   Paroxysmal A-fib: Continue metoprolol  50 twice daily, heparin  drip transitioned to Eliquis  6/5    AKI on CKD stage IV Baseline creatinine around low twos.  Noted bump in creatinine in the setting of diuresis. Nephrology consulted, IV fluids started with improving renal function Lasix  on hold  Cont to void nephrotoxic medications.  Monitor urine output renal function Recent Labs    09/03/23 0149 09/03/23 0806 09/04/23 0321 09/05/23 0438 09/06/23 0338 09/07/23 0347 09/08/23 0409 09/09/23 0358 09/10/23 0409 09/11/23 0340  BUN 20 20 21 21 23  26* 31* 36* 32* 27*  CREATININE 2.06* 1.90* 2.01* 2.17* 2.45* 2.92* 2.51* 3.66* 3.15* 2.61*  CO2 25 23 25 25 26 23 24  21* 19* 19*  K 4.4 4.3 4.3 3.7 3.7 3.9 3.6 3.9 4.1 3.6    Hypertension Stable on metoprolol .   Hyperlipidemia Continue Lipitor .   Anemia-suspect anemia of 20s and iron deficiency anemia Over stable no indication for transfusion Recent Labs  Lab 09/06/23 0338 09/07/23 0347 09/08/23 0409 09/09/23 0358 09/10/23 0409  HGB 8.4* 8.9* 8.7* 8.8* 8.2*  HCT 28.1* 30.2* 28.5* 28.5* 27.8*   GERD Continue Protonix .   Incidental stable AAA Patient will need outpatient follow-up with vascular surgery for follow-up CTA or MRA in 6 months   Deconditioning/debility: Continue PT OT skilled nursing facility has been recommended and awaiting for placement once cleared by nephrology  DVT prophylaxis: Place and maintain sequential compression device Start: 09/03/23 0902 Code Status:   Code Status: Limited: Do not attempt resuscitation (DNR) -DNR-LIMITED -Do Not Intubate/DNI  Family Communication: plan of care discussed with patient at bedside. Patient status is: Remains hospitalized because of severity of illness Level of care: Telemetry   Dispo: The patient is from: home             Anticipated disposition: pending SNF. Hopefully creatinine will continue to be better by tomorrow and she will be ready medically  Objective: Vitals last 24 hrs: Vitals:   09/10/23 1938 09/11/23 0500 09/11/23 0621 09/11/23 0621  BP: (!) 149/82  (!) 143/89 (!) 143/89  Pulse: (!) 101  99 95  Resp: 20  20 20   Temp: 98.6 F (37 C)  98.7 F (37.1 C) 98.7 F (37.1 C)  TempSrc: Oral  Oral Oral  SpO2: 97%  95% 95%  Weight:  87.1 kg    Height:       Physical Examination: General exam: alert awake, oriented HEENT:Oral mucosa moist, Ear/Nose WNL grossly Respiratory system: Bilaterally clear BS,no use of accessory muscle Cardiovascular system: S1 & S2 +, No JVD. Gastrointestinal system: Abdomen soft, ostomy in place,ND, BS+ Nervous System: Alert, awake, moving all extremities,and following commands. Extremities: LE edema neg,distal peripheral pulses palpable and warm.  Skin: No rashes,no icterus. MSK: Normal muscle bulk,tone, power   Data Reviewed: I have personally reviewed following labs and imaging studies ( see epic result tab) CBC: Recent Labs  Lab 09/05/23 0438 09/06/23 1610 09/07/23 0347 09/08/23 0409 09/09/23 0358 09/10/23 0409  WBC 13.4* 12.9* 11.7* 8.8 9.2 7.3  NEUTROABS 10.2*  --   --   --  6.0  --   HGB 8.0* 8.4* 8.9* 8.7* 8.8* 8.2*  HCT 27.1* 28.1* 30.2* 28.5* 28.5* 27.8*  MCV 101.9* 101.4* 103.8* 99.7 97.6 105.3*  PLT 331 353 351 337 292 278   CMP: Recent Labs  Lab 09/05/23 0438 09/06/23 0338 09/07/23 0347 09/08/23 0409 09/09/23 0358 09/10/23 0409 09/11/23 0340  NA 137   < > 135 133* 137 137 140  K 3.7   < > 3.9 3.6 3.9 4.1 3.6  CL 100   < > 98 99 102 106 112*  CO2 25   < > 23 24 21* 19* 19*  GLUCOSE 108*   < > 98 110* 105* 94 111*  BUN 21   < > 26* 31* 36* 32* 27*  CREATININE 2.17*   < > 2.92* 2.51* 3.66* 3.15* 2.61*  CALCIUM  8.6*   < > 9.0 8.6* 8.9 8.7* 8.7*  MG 1.7  --  1.7 1.7 1.8  --   --   PHOS 3.3  --  2.2* 2.9 3.4  --   --    < > =  values in this interval not displayed.   GFR: Estimated Creatinine Clearance: 25.3 mL/min (A) (by C-G formula based on SCr of 2.61 mg/dL (H)). Recent Labs  Lab 09/05/23 0438 09/06/23 0338 09/07/23 0347 09/08/23 0409 09/09/23 0358  AST 11* 10* 9* 12* 15  ALT 9 6 <5 6 <5  ALKPHOS 70 69 65 66 66  BILITOT 1.0 0.6 1.1 1.2 1.1  PROT 6.7 6.9 6.9 6.9 7.0  ALBUMIN  3.0* 2.9* 3.1* 3.2* 3.1*   No results for input(s): "LIPASE", "AMYLASE" in the last 168 hours. No results for input(s): "AMMONIA" in the last 168 hours. Coagulation Profile: No results for input(s): "INR", "PROTIME" in the last 168 hours. Unresulted Labs (From admission, onward)     Start     Ordered   09/10/23 0500  Basic metabolic panel with GFR  Daily,   R     Question:  Specimen collection method  Answer:  Lab=Lab collect   09/09/23 1736           Antimicrobials/Microbiology: Anti-infectives (From admission, onward)    Start     Dose/Rate Route Frequency Ordered Stop   09/06/23 1030  piperacillin -tazobactam (ZOSYN ) IVPB 3.375 g  Status:  Discontinued        3.375 g 12.5 mL/hr over 240 Minutes Intravenous Every 8 hours 09/06/23 1013 09/10/23 1355   09/04/23 1600  vancomycin  (VANCOCIN ) IVPB 1000 mg/200 mL premix  Status:  Discontinued        1,000 mg 200 mL/hr over 60 Minutes Intravenous Every 36 hours 09/03/23 0655 09/04/23 0815   09/04/23 1000  ceFAZolin  (ANCEF ) IVPB 2g/100 mL premix  Status:  Discontinued        2 g 200 mL/hr over 30 Minutes Intravenous Every 8 hours 09/04/23 0815 09/04/23 0901   09/04/23 1000  ceFAZolin  (ANCEF ) IVPB 2g/100 mL premix  Status:  Discontinued        2 g 200 mL/hr over 30 Minutes Intravenous Every 12 hours 09/04/23 0901 09/06/23 1004   09/03/23 1600  metroNIDAZOLE  (FLAGYL ) IVPB 500 mg  Status:  Discontinued        500 mg 100 mL/hr over 60 Minutes Intravenous Every 12 hours 09/03/23 0643 09/04/23 0815   09/03/23 1500  ceFEPIme  (MAXIPIME ) 2 g in sodium chloride  0.9 % 100  mL IVPB   Status:  Discontinued        2 g 200 mL/hr over 30 Minutes Intravenous Every 12 hours 09/03/23 0655 09/04/23 0815   09/03/23 0400  metroNIDAZOLE  (FLAGYL ) IVPB 500 mg        500 mg 100 mL/hr over 60 Minutes Intravenous  Once 09/03/23 0358 09/03/23 0535   09/03/23 0330  vancomycin  (VANCOREADY) IVPB 1750 mg/350 mL        1,750 mg 175 mL/hr over 120 Minutes Intravenous  Once 09/03/23 0325 09/03/23 0608   09/03/23 0300  ceFEPIme  (MAXIPIME ) 2 g in sodium chloride  0.9 % 100 mL IVPB        2 g 200 mL/hr over 30 Minutes Intravenous  Once 09/03/23 0251 09/03/23 0336         Component Value Date/Time   SDES ABSCESS 09/07/2023 1548   SPECREQUEST ABD 09/07/2023 1548   CULT  09/07/2023 1548    NO GROWTH 3 DAYS NO ANAEROBES ISOLATED; CULTURE IN PROGRESS FOR 5 DAYS Performed at [facility], [address], [place], [place] [address]    REPTSTATUS PENDING 09/07/2023 1548    Procedures:  Medications reviewed:  Scheduled Meds:  amiodarone   200 mg Oral BID   apixaban   5 mg Oral BID   atorvastatin   80 mg Oral Daily   Chlorhexidine  Gluconate Cloth  6 each Topical Daily   diclofenac  Sodium  2 g Topical BID   fiber  1 packet Oral BID   loperamide  2 mg Oral TID   pantoprazole   40 mg Oral Daily   Continuous Infusions:  lactated ringers  75 mL/hr at 09/11/23 0840    Lesa Rape, MD [facility] 09/11/2023, 9:51 AM

## 2023-09-11 NOTE — Plan of Care (Signed)

## 2023-09-11 NOTE — TOC Progression Note (Signed)
 Transition of Care [facility]) - Progression Note   Patient Details  Name: Dorothy Harris MRN: [id number] Date of Birth: May 05, 1960  Transition of Care [facility]) CM/SW Contact  Zenon Hilda, LCSW Phone Number: 09/11/2023, 3:39 PM  Clinical Narrative: CSW spoke with patient and sister, [name], regarding discharge plan. Sister reported the patient could discharge to her home ([address] [place], [place] [address]) with HH if it could be set up. CSW made referrals to the following agencies:  [facility]: out of network [facility]: out of network [employer]: out of [facility]: out of network [facility]: out of network [facility]: out of [employer]: out of [employer]: out of network [facility]: no response  CSW spoke with [place] with [facility] and [place] informed CSW that [facility] is not in all counties, so [employer] is not in network with any HH agencies in the area.  CSW notified patient and she chose [facility] for rehab. CSW attempted to reach [place] in admissions at [facility], but was informed by the facility she is out of office and will not be able to review the patient until Monday.  Expected Discharge Plan: Skilled Nursing Facility Barriers to Discharge: Continued Medical Work up, English as a second language teacher  Expected Discharge Plan and Services In-house Referral: Clinical Social Work Post Acute Care Choice: NA Living arrangements for the past 2 months: Skilled Nursing Facility [name])          DME Arranged: N/A DME Agency: NA  Social Determinants of Health (SDOH) Interventions SDOH Screenings   Food Insecurity: Food Insecurity Present (09/03/2023)  Housing: High Risk (09/03/2023)  Transportation Needs: No Transportation Needs (09/03/2023)  Utilities: Not At Risk (09/03/2023)  Alcohol Screen: Low Risk  (05/07/2021)  Depression (PHQ2-9): Medium Risk (05/05/2023)  Financial Resource Strain: Low Risk  (05/07/2021)  Physical Activity: Inactive (05/07/2021)  Tobacco Use:  High Risk (09/05/2023)   Readmission Risk Interventions    09/03/2023   10:08 AM 08/11/2023    8:27 AM 08/07/2023    7:29 AM  Readmission Risk Prevention Plan  Medication Screening   Complete  Transportation Screening Complete Complete Complete  PCP or Specialist Appt within 5-7 Days Complete    Home Care Screening Complete    Medication Review (RN CM) Complete    HRI or Home Care Consult  Complete   Social Work Consult for Recovery Care Planning/Counseling  Complete   Palliative Care Screening  Not Applicable   Medication Review [employer])  Complete

## 2023-09-12 DIAGNOSIS — I5033 Acute on chronic diastolic (congestive) heart failure: Secondary | ICD-10-CM | POA: Diagnosis not present

## 2023-09-12 LAB — BASIC METABOLIC PANEL WITH GFR
Anion gap: 8 (ref 5–15)
BUN: 23 mg/dL (ref 8–23)
CO2: 22 mmol/L (ref 22–32)
Calcium: 8.7 mg/dL — ABNORMAL LOW (ref 8.9–10.3)
Chloride: 108 mmol/L (ref 98–111)
Creatinine, Ser: 2.36 mg/dL — ABNORMAL HIGH (ref 0.44–1.00)
GFR, Estimated: 23 mL/min — ABNORMAL LOW (ref >60)
Glucose, Bld: 98 mg/dL (ref 70–99)
Potassium: 3.7 mmol/L (ref 3.5–5.1)
Sodium: 138 mmol/L (ref 135–145)

## 2023-09-12 LAB — AEROBIC/ANAEROBIC CULTURE W GRAM STAIN (SURGICAL/DEEP WOUND)
Culture: NO GROWTH
Gram Stain: NONE SEEN

## 2023-09-12 NOTE — Plan of Care (Signed)

## 2023-09-12 NOTE — Progress Notes (Signed)
 PROGRESS NOTE    Dorothy Harris  ZOX:[id number] DOB: 19-Mar-1961 DOA: 09/03/2023 PCP: [name], MD    Brief Narrative:63 y.o. 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  Recently admitted to [facility] from 5/1-5/24 for high-grade SBO status post ex lap with lysis of adhesions and resection of small bowel with end ileostomy revision on 5/15.  Also treated for AKI on CKD stage IV due to volume depletion/dehydration treated with IV fluids and acute anemia treated with a total of 3 units PRBCs. Started on oral metoprolol  for A-fib and Eliquis  was resumed and discharged to SNF. who presented to the ED from SNF for evaluation of leaking ileostomy bag and concern for sepsis on admission with imaging notable for multiple abdominal fluid collections and volume overload.   Surgery initially did not think fluid collections infected.  IR consulted for aspiration on 6/2.  Surgery still following.  Assessment & Plan:   Principal Problem:   Acute on chronic heart failure with preserved ejection fraction (HFpEF) (HCC) Active Problems:   Acute hypoxemic respiratory failure (HCC)   PAF (paroxysmal atrial fibrillation) (HCC)   Essential hypertension   Stage 4 chronic kidney disease (HCC)   Ileus (HCC)   Edema of left upper extremity   QT prolongation   Sepsis (HCC)  Multiple abdominal fluid collections concerning for abscess vs seromas/hematomas Ileus versus low-grade SBO Ostomy leakage/with high output-nonspecific enteritis recent SBO s/p ex-lap with LOA/Resection of SB w/h end ileostomy revision on 5/15: Treating for mild cellulitis around incision AND fluid collection concerning- had repeat CT 6/1 with new wall thickening and inflammation of small bowel loops leading  up to the RLQ ileostomy c/w nonspecific enteritis  and Increasing mesenteric edema and interloop fluid.  Trace free fluid in the pelvis. Stable areas of internal fluid in the RLQ. s/p US -guided right abdomen wall fluid collection aspiration-so far negative culture> per surgery antibiotic discontinued no concern for infection. Staples removed  Diet has been advanced continue fiber for high output, GI pathogen panel negative. Continue diet as tolerated    Acute on chronic HFpEF Acute hypoxemic respiratory failure Pulmonary Edema Anasarca Volume Overload: Presenting with fluid overload bilateral leg edema GGO on right middle and lower lobes on imaging Recent weight 86 kg on 5/4 on 5/29 103 KG> diuresed  w/ net negative and weight improved Diuretics have been held due to AKI, management IV fluids with improving renal function Echo 08/2023 with EF 60-65%, no RWMA. Severe asymmetric LVH.  Diastolic function could not be evaluated.  Trivial MR. Mod calcification of AV.   SIRS Afebrile, but leukocytosis, tachycardic, tachypnic on admit> which has improved.   Ground-glass opacities in the right middle and lower lobes: Suspecting due to volume overload rather than infection.  Diuresed.  Negative COVID flu and RSV.  NT patient discontinued   Left upper extremity edema Acute Superficial Vein Thrombosis of L Cephalic Vein Cont supportive care   Elevated Troponin: Mild elevation, without cardiac symptoms chest pain EKG sinus borderline ST elevation in lead III T wave inversions but similar   QT prolongation QTc 597 on EKG and patient is on amiodarone .  Monitor electrolytes caution with QT prolonging medication QTc has improved   Paroxysmal A-fib: Continue metoprolol  50 twice daily, heparin  drip transitioned to Eliquis  6/5    AKI on CKD stage  IV Baseline creatinine around low twos.  Noted bump in creatinine in the setting of diuresis. Nephrology consulted, IV fluids started with improving renal  function Lasix  on hold  Cont to void nephrotoxic medications.  Monitor urine output renal function   Hypertension Stable on metoprolol .   Hyperlipidemia Continue Lipitor .   Anemia-suspect anemia of 20s and iron deficiency anemia Over stable no indication for transfusion    GERD Continue Protonix .   Incidental stable AAA Patient will need outpatient follow-up with vascular surgery for follow-up CTA or MRA in 6 months    Deconditioning/debility: Continue PT OT skilled nursing facility has been recommended and awaiting for placement once cleared by n  Estimated body mass index is 29.97 kg/m as calculated from the following:   Height as of this encounter: 5\' 7"  (1.702 m).   Weight as of this encounter: 86.8 kg.  DVT prophylaxis: scd Code Status: dnr limited Family Communication:none Disposition Plan:  Status is: Inpatient Remains inpatient appropriate because: Acute illness   Consultants: Nephrology, general surgery  Antimicrobials: Anti-infectives (From admission, onward)    Start     Dose/Rate Route Frequency Ordered Stop   09/06/23 1030  piperacillin -tazobactam (ZOSYN ) IVPB 3.375 g  Status:  Discontinued        3.375 g 12.5 mL/hr over 240 Minutes Intravenous Every 8 hours 09/06/23 1013 09/10/23 1355   09/04/23 1600  vancomycin  (VANCOCIN ) IVPB 1000 mg/200 mL premix  Status:  Discontinued        1,000 mg 200 mL/hr over 60 Minutes Intravenous Every 36 hours 09/03/23 0655 09/04/23 0815   09/04/23 1000  ceFAZolin  (ANCEF ) IVPB 2g/100 mL premix  Status:  Discontinued        2 g 200 mL/hr over 30 Minutes Intravenous Every 8 hours 09/04/23 0815 09/04/23 0901   09/04/23 1000  ceFAZolin  (ANCEF ) IVPB 2g/100 mL premix  Status:  Discontinued        2 g 200 mL/hr over 30 Minutes Intravenous Every 12 hours 09/04/23 0901 09/06/23 1004   09/03/23 1600  metroNIDAZOLE  (FLAGYL ) IVPB 500 mg  Status:  Discontinued        500 mg 100 mL/hr over 60 Minutes Intravenous Every 12 hours  09/03/23 0643 09/04/23 0815   09/03/23 1500  ceFEPIme  (MAXIPIME ) 2 g in sodium chloride  0.9 % 100 mL IVPB  Status:  Discontinued        2 g 200 mL/hr over 30 Minutes Intravenous Every 12 hours 09/03/23 0655 09/04/23 0815   09/03/23 0400  metroNIDAZOLE  (FLAGYL ) IVPB 500 mg        500 mg 100 mL/hr over 60 Minutes Intravenous  Once 09/03/23 0358 09/03/23 0535   09/03/23 0330  vancomycin  (VANCOREADY) IVPB 1750 mg/350 mL        1,750 mg 175 mL/hr over 120 Minutes Intravenous  Once 09/03/23 0325 09/03/23 0608   09/03/23 0300  ceFEPIme  (MAXIPIME ) 2 g in sodium chloride  0.9 % 100 mL IVPB        2 g 200 mL/hr over 30 Minutes Intravenous  Once 09/03/23 0251 09/03/23 0336        Subjective: She is resting in bed no overnight events noted by the night staff she thinks she slept okay no new complaints   objective: Vitals:   09/11/23 1247 09/11/23 2029 09/12/23 0500 09/12/23 0554  BP: (!) 164/100 (!) 145/99  (!) 147/84  Pulse: 100 100    Resp:    18  Temp: 98.1 F (36.7 C) 98.9 F (37.2 C)  98.4 F (36.9 C)  TempSrc: Oral Oral    SpO2: 96% 97%  95%  Weight:   86.8 kg   Height:        Intake/Output Summary (Last 24 hours) at 09/12/2023 1133 Last data filed at 09/12/2023 0600 Gross per 24 hour  Intake 1203.58 ml  Output 750 ml  Net 453.58 ml   Filed Weights   09/10/23 0500 09/11/23 0500 09/12/23 0500  Weight: 86.4 kg 87.1 kg 86.8 kg    Examination:  General exam: Appears in no acute distress  respiratory system: Clear to auscultation. Respiratory effort normal. Cardiovascular system: S1 & S2 heard, RRR. No JVD, murmurs, rubs, gallops or clicks. No pedal edema. Gastrointestinal system: Abdomen is distended, soft and nontender. ostomy in place  Central nervous system: Alert and oriented. No focal neurological deficits. Extremities: Mild bi lateral lower extremity edema   Data Reviewed: I have personally reviewed following labs and imaging studies  CBC: Recent Labs  Lab  09/06/23 0338 09/07/23 0347 09/08/23 0409 09/09/23 0358 09/10/23 0409  WBC 12.9* 11.7* 8.8 9.2 7.3  NEUTROABS  --   --   --  6.0  --   HGB 8.4* 8.9* 8.7* 8.8* 8.2*  HCT 28.1* 30.2* 28.5* 28.5* 27.8*  MCV 101.4* 103.8* 99.7 97.6 105.3*  PLT 353 351 337 292 278   Basic Metabolic Panel: Recent Labs  Lab 09/07/23 0347 09/08/23 0409 09/09/23 0358 09/10/23 0409 09/11/23 0340 09/12/23 0355  NA 135 133* 137 137 140 138  K 3.9 3.6 3.9 4.1 3.6 3.7  CL 98 99 102 106 112* 108  CO2 23 24 21* 19* 19* 22  GLUCOSE 98 110* 105* 94 111* 98  BUN 26* 31* 36* 32* 27* 23  CREATININE 2.92* 2.51* 3.66* 3.15* 2.61* 2.36*  CALCIUM  9.0 8.6* 8.9 8.7* 8.7* 8.7*  MG 1.7 1.7 1.8  --   --   --   PHOS 2.2* 2.9 3.4  --   --   --    GFR: Estimated Creatinine Clearance: 28 mL/min (A) (by C-G formula based on SCr of 2.36 mg/dL (H)). Liver Function Tests: Recent Labs  Lab 09/06/23 0338 09/07/23 0347 09/08/23 0409 09/09/23 0358  AST 10* 9* 12* 15  ALT 6 <5 6 <5  ALKPHOS 69 65 66 66  BILITOT 0.6 1.1 1.2 1.1  PROT 6.9 6.9 6.9 7.0  ALBUMIN  2.9* 3.1* 3.2* 3.1*   No results for input(s): "LIPASE", "AMYLASE" in the last 168 hours. No results for input(s): "AMMONIA" in the last 168 hours. Coagulation Profile: No results for input(s): "INR", "PROTIME" in the last 168 hours. Cardiac Enzymes: No results for input(s): "CKTOTAL", "CKMB", "CKMBINDEX", "TROPONINI" in the last 168 hours. BNP (last 3 results) No results for input(s): "PROBNP" in the last 8760 hours. HbA1C: No results for input(s): "HGBA1C" in the last 72 hours. CBG: No results for input(s): "GLUCAP" in the last 168 hours. Lipid Profile: No results for input(s): "CHOL", "HDL", "LDLCALC", "TRIG", "CHOLHDL", "LDLDIRECT" in the last 72 hours. Thyroid Function Tests: No results for input(s): "TSH", "T4TOTAL", "FREET4", "T3FREE", "THYROIDAB" in the last 72 hours. Anemia Panel: No results for input(s): "VITAMINB12", "FOLATE", "FERRITIN", "TIBC",  "IRON", "RETICCTPCT" in the last 72 hours. Sepsis Labs: No results for input(s): "PROCALCITON", "LATICACIDVEN" in the last 168 hours.  Recent Results (from the past 240 hours)  Resp panel by RT-PCR (RSV, Flu A&B, Covid) Anterior Nasal Swab     Status: None   Collection Time: 09/03/23  1:49 AM  Specimen: Anterior Nasal Swab  Result Value Ref Range Status   SARS Coronavirus 2 by RT PCR NEGATIVE NEGATIVE Final    Comment: (NOTE) SARS-CoV-2 target nucleic acids are NOT DETECTED.  The SARS-CoV-2 RNA is generally detectable in upper respiratory specimens during the acute phase of infection. The lowest concentration of SARS-CoV-2 viral copies this assay can detect is 138 copies/mL. A negative result does not preclude SARS-Cov-2 infection and should not be used as the sole basis for treatment or other patient management decisions. A negative result may occur with  improper specimen collection/handling, submission of specimen other than nasopharyngeal swab, presence of viral mutation(s) within the areas targeted by this assay, and inadequate number of viral copies(<138 copies/mL). A negative result must be combined with clinical observations, patient history, and epidemiological information. The expected result is Negative.  Fact Sheet for Patients:  BloggerCourse.com  Fact Sheet for Healthcare Providers:  SeriousBroker.it  This test is no t yet approved or cleared by the [place]  FDA and  has been authorized for detection and/or diagnosis of SARS-CoV-2 by FDA under an Emergency Use Authorization (EUA). This EUA will remain  in effect (meaning this test can be used) for the duration of the COVID-19 declaration under Section 564(b)(1) of the Act, 21 U.S.C.section 360bbb-3(b)(1), unless the authorization is terminated  or revoked sooner.       Influenza A by PCR NEGATIVE NEGATIVE Final   Influenza B by PCR NEGATIVE NEGATIVE Final     Comment: (NOTE) The Xpert Xpress SARS-CoV-2/FLU/RSV plus assay is intended as an aid in the diagnosis of influenza from Nasopharyngeal swab specimens and should not be used as a sole basis for treatment. Nasal washings and aspirates are unacceptable for Xpert Xpress SARS-CoV-2/FLU/RSV testing.  Fact Sheet for Patients: BloggerCourse.com  Fact Sheet for Healthcare Providers: SeriousBroker.it  This test is not yet approved or cleared by the [place]  FDA and has been authorized for detection and/or diagnosis of SARS-CoV-2 by FDA under an Emergency Use Authorization (EUA). This EUA will remain in effect (meaning this test can be used) for the duration of the COVID-19 declaration under Section 564(b)(1) of the Act, 21 U.S.C. section 360bbb-3(b)(1), unless the authorization is terminated or revoked.     Resp Syncytial Virus by PCR NEGATIVE NEGATIVE Final    Comment: (NOTE) Fact Sheet for Patients: BloggerCourse.com  Fact Sheet for Healthcare Providers: SeriousBroker.it  This test is not yet approved or cleared by the [place]  FDA and has been authorized for detection and/or diagnosis of SARS-CoV-2 by FDA under an Emergency Use Authorization (EUA). This EUA will remain in effect (meaning this test can be used) for the duration of the COVID-19 declaration under Section 564(b)(1) of the Act, 21 U.S.C. section 360bbb-3(b)(1), unless the authorization is terminated or revoked.  Performed at [facility], [address], [place], [place] [address]   Blood culture (routine x 2)     Status: None   Collection Time: 09/03/23  1:49 AM   Specimen: BLOOD  Result Value Ref Range Status   Specimen Description   Final    BLOOD RIGHT ANTECUBITAL Performed at [facility], [address] [place], [place] [address]    Special Requests   Final     BOTTLES DRAWN AEROBIC AND ANAEROBIC Blood Culture adequate volume Performed at [facility], [address], [place], [place] [address]    Culture   Final    NO GROWTH 5 DAYS Performed at [facility], [address] [place], [place] [address]    Report Status 09/08/2023 FINAL  Final  Blood culture (routine x 2)     Status: None   Collection Time: 09/03/23  3:00 AM   Specimen: BLOOD  Result Value Ref Range Status   Specimen Description   Final    BLOOD BLOOD RIGHT WRIST Performed at [facility], [address] [place], [place] [address]    Special Requests   Final    BOTTLES DRAWN AEROBIC AND ANAEROBIC Blood Culture adequate volume Performed at [facility], [address], [place], [place] [address]    Culture   Final    NO GROWTH 5 DAYS Performed at [facility], [address], [place], [place] [address]    Report Status 09/08/2023 FINAL  Final  MRSA Next Gen by PCR, Nasal     Status: None   Collection Time: 09/03/23  4:37 AM   Specimen: Nasal Mucosa; Nasal Swab  Result Value Ref Range Status   MRSA by PCR Next Gen NOT DETECTED NOT DETECTED Final    Comment: (NOTE) The GeneXpert MRSA Assay (FDA approved for NASAL specimens only), is one component of a comprehensive MRSA colonization surveillance program. It is not intended to diagnose MRSA infection nor to guide or monitor treatment for MRSA infections. Test performance is not FDA approved in patients less than 66 years old. Performed at [facility], [address] [place], [place] [address]   Gastrointestinal Panel by PCR , Stool     Status: None   Collection Time: 09/07/23  2:54 PM   Specimen: Stool  Result Value Ref Range Status   Campylobacter species NOT DETECTED NOT DETECTED Final   Plesimonas shigelloides NOT DETECTED NOT DETECTED Final   Salmonella species NOT DETECTED NOT DETECTED Final   Yersinia enterocolitica  NOT DETECTED NOT DETECTED Final   Vibrio species NOT DETECTED NOT DETECTED Final   Vibrio cholerae NOT DETECTED NOT DETECTED Final   Enteroaggregative E coli (EAEC) NOT DETECTED NOT DETECTED Final   Enteropathogenic E coli (EPEC) NOT DETECTED NOT DETECTED Final   Enterotoxigenic E coli (ETEC) NOT DETECTED NOT DETECTED Final   Shiga like toxin producing E coli (STEC) NOT DETECTED NOT DETECTED Final   Shigella/Enteroinvasive E coli (EIEC) NOT DETECTED NOT DETECTED Final   Cryptosporidium NOT DETECTED NOT DETECTED Final   Cyclospora cayetanensis NOT DETECTED NOT DETECTED Final   Entamoeba histolytica NOT DETECTED NOT DETECTED Final   Giardia lamblia NOT DETECTED NOT DETECTED Final   Adenovirus F40/41 NOT DETECTED NOT DETECTED Final   Astrovirus NOT DETECTED NOT DETECTED Final   Norovirus GI/GII NOT DETECTED NOT DETECTED Final   Rotavirus A NOT DETECTED NOT DETECTED Final   Sapovirus (I, II, IV, and V) NOT DETECTED NOT DETECTED Final    Comment: Performed at [facility], [address] [place], [place] [address]  Aerobic/Anaerobic Culture w Gram Stain (surgical/deep wound)     Status: None (Preliminary result)   Collection Time: 09/07/23  3:48 PM   Specimen: Abscess  Result Value Ref Range Status   Specimen Description ABSCESS  Final   Special Requests ABD  Final   Gram Stain NO WBC SEEN NO ORGANISMS SEEN   Final   Culture   Final    NO GROWTH 5 DAYS NO ANAEROBES ISOLATED; CULTURE IN PROGRESS FOR 5 DAYS Performed at [facility], [address], [place], [place] [address]  Report Status PENDING  Incomplete     Radiology Studies: No results found.   Scheduled Meds:  amiodarone   200 mg Oral BID   apixaban   5 mg Oral BID   atorvastatin   80 mg Oral Daily   Chlorhexidine  Gluconate Cloth  6 each Topical Daily   diclofenac  Sodium  2 g Topical BID   fiber  1 packet Oral BID   loperamide   2 mg Oral TID   pantoprazole   40 mg Oral Daily   Continuous Infusions:    LOS: 9 days    Barbee Lew, MD  09/12/2023, 11:33 AM

## 2023-09-13 DIAGNOSIS — I5033 Acute on chronic diastolic (congestive) heart failure: Secondary | ICD-10-CM | POA: Diagnosis not present

## 2023-09-13 LAB — BASIC METABOLIC PANEL WITH GFR
Anion gap: 9 (ref 5–15)
BUN: 23 mg/dL (ref 8–23)
CO2: 21 mmol/L — ABNORMAL LOW (ref 22–32)
Calcium: 8.6 mg/dL — ABNORMAL LOW (ref 8.9–10.3)
Chloride: 109 mmol/L (ref 98–111)
Creatinine, Ser: 2.39 mg/dL — ABNORMAL HIGH (ref 0.44–1.00)
GFR, Estimated: 22 mL/min — ABNORMAL LOW (ref >60)
Glucose, Bld: 90 mg/dL (ref 70–99)
Potassium: 4 mmol/L (ref 3.5–5.1)
Sodium: 139 mmol/L (ref 135–145)

## 2023-09-13 NOTE — Progress Notes (Signed)
 Mobility Specialist - Progress Note   09/13/23 1150  Mobility  Activity Ambulated with assistance in room;Transferred from bed to chair  Level of Assistance Contact guard assist, steadying assist  Assistive Device Front wheel walker  Distance Ambulated (ft) 3 ft  Range of Motion/Exercises Active  Activity Response Tolerated well  Mobility Referral Yes  Mobility visit 1 Mobility  Mobility Specialist Start Time (ACUTE ONLY) 1140  Mobility Specialist Stop Time (ACUTE ONLY) 1150  Mobility Specialist Time Calculation (min) (ACUTE ONLY) 10 min   Pt was found in bed and agreeable to mobilize. No complaints with session and was left on recliner chair with all needs met. Call bell in reach. (NT assisted with session)  Lorna Rose,  Mobility Specialist Can be reached via Secure Chat

## 2023-09-13 NOTE — Progress Notes (Signed)
 PROGRESS NOTE Dorothy Harris  HQI:[id number] DOB: 17-Nov-1960 DOA: 09/03/2023 PCP: [name], MD  Brief Narrative/Hospital Course: 63 y.o. 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  Recently admitted to [facility] from 5/1-5/24 for high-grade SBO status post ex lap with lysis of adhesions and resection of small bowel with end ileostomy revision on 5/15.  Also treated for AKI on CKD stage IV due to volume depletion/dehydration treated with IV fluids and acute anemia treated with a total of 3 units PRBCs. Started on oral metoprolol  for A-fib and Eliquis  was resumed and discharged to SNF. who presented to the ED from SNF for evaluation of leaking ileostomy bag and concern for sepsis on admission with imaging notable for multiple abdominal fluid collections and volume overload.   Surgery initially did not think fluid collections infected.  IR consulted for aspiration on 6/2.  Surgery still following.    Subjective: Seen and examined Resting comfortably no complaints. Overnight patient remains afebrile mild tachycardia, doing well on room air BP stable Labs shows creatinine further down to 2.3= recent baseline creatinine around 2-2.3 in May Ileostomy output down in 300-400s.  Assessment and Plan:  Multiple abdominal fluid collections concerning for abscess vs seromas/hematomas Ileus versus low-grade SBO Ostomy leakage/with high output-nonspecific enteritis recent SBO s/p ex-lap with LOA/Resection of SB w/h end ileostomy revision on 5/15: Treated for mild cellulitis around incision AND fluid collection concerning- had repeat CT 6/1 with new wall thickening and inflammation of small bowel loops leading up to the RLQ ileostomy c/w nonspecific enteritis  and Increasing  mesenteric edema and interloop fluid.  Trace free fluid in the pelvis.  s/p US -guided right abdomen wall fluid collection aspiration-so far negative culture> per [facility]-Antibiotic discontinued as no concern for infection.Staples removed  Tolerating  Po and ileostomy output has improved continue fiber   Acute on chronic HFpEF Acute hypoxemic respiratory failure Pulmonary Edema Anasarca Volume Overload: Presented with fluid overload bilateral leg edema GGO on right middle and lower lobes on imaging Recent weight 86 kg on 5/4 on 5/29 103 KG> diuresed  w/ net negative and weight improved as below. Lasix  held due to AKI, s/p IV fluids infusion and boluses per nephrology and creatinine improved  Echo 08/2023 with EF 60-65%, no RWMA. Severe asymmetric LVH.  Monitor Net IO Since Admission: -[id number] mL [09/13/23 1029]  Filed Weights   09/11/23 0500 09/12/23 0500 09/13/23 0500  Weight: 87.1 kg 86.8 kg 86.5 kg    Recent Labs  Lab 09/07/23 0347 09/08/23 0409 09/09/23 0358 09/10/23 0409 09/11/23 0340 09/12/23 0355 09/13/23 0343  BNP 496.9*  --   --   --   --   --   --   BUN 26* 31* 36* 32* 27* 23 23  CREATININE 2.92* 2.51* 3.66* 3.15* 2.61* 2.36* 2.39*  K 3.9 3.6 3.9 4.1 3.6 3.7 4.0  MG 1.7 1.7 1.8  --   --   --   --     SIRS Afebrile, but leukocytosis, tachycardic, tachypnic on admit> currently stable   Ground-glass opacities in the right middle and lower lobes: Suspecting due to volume overload rather than infection.  Diuresed.  Negative COVID flu and RSV.  Left upper extremity edema Acute Superficial Vein Thrombosis of L Cephalic Vein Cont supportive care   Elevated Troponin: Mildly up  no chest  pain EKG with borderline ST elevation in lead III T wave inversions but similar before. Tte as above.   QT prolongation QTc 597 on EKG and patient is on amiodarone .  Monitor electrolytes caution with QT prolonging medication QTc has improved   Paroxysmal A-fib: Rate controlled continue  metoprolol . Heparin  drip transitioned to Eliquis  6/5    AKI on CKD stage IV recent baseline creatinine around 2-2.3 in May. Bump in creatinine in the setting of diuresis and high ileostomy output > seen by nephrology received aggressive fluid resuscitation and Lasix  held.  Creatinine has improved to baseline.    Hypertension Stable on metoprolol .   Hyperlipidemia Continue Lipitor .   Anemia-suspect anemia of 20s and iron deficiency anemia Stable in 8 to 9 g.    GERD Continue Protonix .   Incidental stable AAA Patient will need outpatient follow-up with vascular surgery for follow-up CTA or MRA in 6 months   Deconditioning/debility: Awaiting for placement to SNF.   DVT prophylaxis: Place and maintain sequential compression device Start: 09/03/23 0902 Code Status:   Code Status: Limited: Do not attempt resuscitation (DNR) -DNR-LIMITED -Do Not Intubate/DNI  Family Communication: plan of care discussed with patient at bedside. Patient status is: Remains hospitalized because of severity of illness Level of care: Telemetry   Dispo: The patient is from: home            Anticipated disposition: pending SNF.  Medically stable discharge   Objective: Vitals last 24 hrs: Vitals:   09/12/23 1244 09/12/23 2012 09/13/23 0500 09/13/23 0618  BP: (!) 148/96 (!) 145/97  (!) 135/97  Pulse: (!) 104 (!) 109  (!) 107  Resp: 18 20  20   Temp: 98.5 F (36.9 C) 99 F (37.2 C)  99.5 F (37.5 C)  TempSrc: Oral Oral    SpO2: 97% 96%  93%  Weight:   86.5 kg   Height:       Physical Examination: General exam: alert awake, oriented at baseline, older than stated age HEENT:Oral mucosa moist, Ear/Nose WNL grossly Respiratory system: Bilaterally clear BS,no use of accessory muscle Cardiovascular system: S1 & S2 +, No JVD. Gastrointestinal system: Abdomen soft, tender in place NT,ND, BS+ Nervous System: Alert, awake, moving all extremities,and following commands. Extremities: LE edema neg,distal  peripheral pulses palpable and warm.  Skin: No rashes,no icterus. MSK: Normal muscle bulk,tone, power   Data Reviewed: I have personally reviewed following labs and imaging studies ( see epic result tab) CBC: Recent Labs  Lab 09/07/23 0347 09/08/23 0409 09/09/23 0358 09/10/23 0409  WBC 11.7* 8.8 9.2 7.3  NEUTROABS  --   --  6.0  --   HGB 8.9* 8.7* 8.8* 8.2*  HCT 30.2* 28.5* 28.5* 27.8*  MCV 103.8* 99.7 97.6 105.3*  PLT 351 337 292 278   CMP: Recent Labs  Lab 09/07/23 0347 09/08/23 0409 09/09/23 0358 09/10/23 0409 09/11/23 0340 09/12/23 0355 09/13/23 0343  NA 135 133* 137 137 140 138 139  K 3.9 3.6 3.9 4.1 3.6 3.7 4.0  CL 98 99 102 106 112* 108 109  CO2 23 24 21* 19* 19* 22 21*  GLUCOSE 98 110* 105* 94 111* 98 90  BUN 26* 31* 36* 32* 27* 23 23  CREATININE 2.92* 2.51* 3.66* 3.15* 2.61* 2.36* 2.39*  CALCIUM  9.0 8.6* 8.9 8.7* 8.7* 8.7* 8.6*  MG 1.7 1.7 1.8  --   --   --   --   PHOS 2.2* 2.9 3.4  --   --   --   --  GFR: Estimated Creatinine Clearance: 27.6 mL/min (A) (by C-G formula based on SCr of 2.39 mg/dL (H)). Recent Labs  Lab 09/07/23 0347 09/08/23 0409 09/09/23 0358  AST 9* 12* 15  ALT <5 6 <5  ALKPHOS 65 66 66  BILITOT 1.1 1.2 1.1  PROT 6.9 6.9 7.0  ALBUMIN  3.1* 3.2* 3.1*   No results for input(s): "LIPASE", "AMYLASE" in the last 168 hours. No results for input(s): "AMMONIA" in the last 168 hours. Coagulation Profile: No results for input(s): "INR", "PROTIME" in the last 168 hours. Unresulted Labs (From admission, onward)     Start     Ordered   09/10/23 0500  Basic metabolic panel with GFR  Daily,   R     Question:  Specimen collection method  Answer:  Lab=Lab collect   09/09/23 1736           Antimicrobials/Microbiology: Anti-infectives (From admission, onward)    Start     Dose/Rate Route Frequency Ordered Stop   09/06/23 1030  piperacillin -tazobactam (ZOSYN ) IVPB 3.375 g  Status:  Discontinued        3.375 g 12.5 mL/hr over 240 Minutes  Intravenous Every 8 hours 09/06/23 1013 09/10/23 1355   09/04/23 1600  vancomycin  (VANCOCIN ) IVPB 1000 mg/200 mL premix  Status:  Discontinued        1,000 mg 200 mL/hr over 60 Minutes Intravenous Every 36 hours 09/03/23 0655 09/04/23 0815   09/04/23 1000  ceFAZolin  (ANCEF ) IVPB 2g/100 mL premix  Status:  Discontinued        2 g 200 mL/hr over 30 Minutes Intravenous Every 8 hours 09/04/23 0815 09/04/23 0901   09/04/23 1000  ceFAZolin  (ANCEF ) IVPB 2g/100 mL premix  Status:  Discontinued        2 g 200 mL/hr over 30 Minutes Intravenous Every 12 hours 09/04/23 0901 09/06/23 1004   09/03/23 1600  metroNIDAZOLE  (FLAGYL ) IVPB 500 mg  Status:  Discontinued        500 mg 100 mL/hr over 60 Minutes Intravenous Every 12 hours 09/03/23 0643 09/04/23 0815   09/03/23 1500  ceFEPIme  (MAXIPIME ) 2 g in sodium chloride  0.9 % 100 mL IVPB  Status:  Discontinued        2 g 200 mL/hr over 30 Minutes Intravenous Every 12 hours 09/03/23 0655 09/04/23 0815   09/03/23 0400  metroNIDAZOLE  (FLAGYL ) IVPB 500 mg        500 mg 100 mL/hr over 60 Minutes Intravenous  Once 09/03/23 0358 09/03/23 0535   09/03/23 0330  vancomycin  (VANCOREADY) IVPB 1750 mg/350 mL        1,750 mg 175 mL/hr over 120 Minutes Intravenous  Once 09/03/23 0325 09/03/23 0608   09/03/23 0300  ceFEPIme  (MAXIPIME ) 2 g in sodium chloride  0.9 % 100 mL IVPB        2 g 200 mL/hr over 30 Minutes Intravenous  Once 09/03/23 0251 09/03/23 0336         Component Value Date/Time   SDES ABSCESS 09/07/2023 1548   SPECREQUEST ABD 09/07/2023 1548   CULT  09/07/2023 1548    No growth aerobically or anaerobically. Performed at [facility], [address], [place], [place] [address]    REPTSTATUS 09/12/2023 FINAL 09/07/2023 1548    Procedures:  Medications reviewed:  Scheduled Meds:  amiodarone   200 mg Oral BID   apixaban   5 mg Oral BID   atorvastatin   80 mg Oral Daily   Chlorhexidine  Gluconate Cloth  6 each Topical Daily  diclofenac  Sodium  2  g Topical BID   fiber  1 packet Oral BID   loperamide   2 mg Oral TID   pantoprazole   40 mg Oral Daily   Continuous Infusions:    Lesa Rape, MD [facility] 09/13/2023, 10:29 AM

## 2023-09-14 DIAGNOSIS — I5033 Acute on chronic diastolic (congestive) heart failure: Secondary | ICD-10-CM | POA: Diagnosis not present

## 2023-09-14 LAB — BASIC METABOLIC PANEL WITH GFR
Anion gap: 8 (ref 5–15)
BUN: 26 mg/dL — ABNORMAL HIGH (ref 8–23)
CO2: 22 mmol/L (ref 22–32)
Calcium: 8.6 mg/dL — ABNORMAL LOW (ref 8.9–10.3)
Chloride: 108 mmol/L (ref 98–111)
Creatinine, Ser: 2.39 mg/dL — ABNORMAL HIGH (ref 0.44–1.00)
GFR, Estimated: 22 mL/min — ABNORMAL LOW (ref >60)
Glucose, Bld: 98 mg/dL (ref 70–99)
Potassium: 3.9 mmol/L (ref 3.5–5.1)
Sodium: 138 mmol/L (ref 135–145)

## 2023-09-14 MED ORDER — NUTRISOURCE FIBER PO PACK: 1.0000 | PACK | Freq: Two times a day (BID) | ORAL | Status: AC

## 2023-09-14 MED ORDER — LOPERAMIDE HCL 2 MG PO CAPS: 2.0000 mg | ORAL_CAPSULE | Freq: Three times a day (TID) | ORAL | Status: DC

## 2023-09-14 NOTE — Progress Notes (Signed)
 Physical Therapy Treatment Patient Details Name: Dorothy Harris MRN: [id number] DOB: Nov 04, 1960 Today's Date: 09/14/2023   History of Present Illness Dorothy Harris is a 63 y.o. 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  Recently admitted to [facility] from 5/1-5/24 for high-grade SBO status post ex lap with lysis of adhesions and resection of small bowel with end ileostomy revision on 5/15.  Also treated for AKI on CKD stage IV due to volume depletion/dehydration treated with IV fluids and acute anemia treated with a total of 3 units PRBCs.  Started on oral metoprolol  for A-fib and Eliquis  was resumed on discharge and was discharged to SNF. ED presentation with leaking iliostomy bag. Re-admitted to [facility] on 09/03/23 with dx CHF, ARF, pulmonary edema.    PT Comments  Pt continues to make gradual progress.  She is able to complete bed mobility with supervision.  She ambulated 60' with min A and min A for transfers.  Goals were met and updated.  Pt does fatigue easily.  She had pain in R foot - suspect from edema, educated on exercises and stretches.  Continue POC.  Patient will benefit from continued inpatient follow up therapy, <3 hours/day at d/c.     If plan is discharge home, recommend the following: A lot of help with bathing/dressing/bathroom;A lot of help with walking and/or transfers;Help with stairs or ramp for entrance;Assistance with cooking/housework   Can travel by private vehicle     No  Equipment Recommendations  None recommended by PT    Recommendations for Other Services       Precautions / Restrictions Precautions Precautions: Fall Precaution/Restrictions Comments: ostomy     Mobility  Bed Mobility Overal bed mobility: Needs Assistance Bed Mobility: Supine to Sit     Supine  to sit: Supervision, Used rails, HOB elevated Sit to supine: Supervision, HOB elevated, Used rails        Transfers Overall transfer level: Needs assistance Equipment used: Rolling walker (2 wheels) Transfers: Sit to/from Stand, Bed to chair/wheelchair/BSC Sit to Stand: Min assist     Squat pivot transfers: Min assist     General transfer comment: STS x 3 with RW; Squat pivot to and from [facility]    Ambulation/Gait Ambulation/Gait assistance: Editor, commissioning (Feet): 28 Feet Assistive device: Rolling walker (2 wheels) Gait Pattern/deviations: Step-to pattern, Decreased stride length, Shuffle, Decreased weight shift to right Gait velocity: decreased     General Gait Details: Min cues for RW proximity; step to R LE - reports pain in ball of foot since last surgeries   Stairs             Wheelchair Mobility     Tilt Bed    Modified Rankin (Stroke Patients Only)       Balance Overall balance assessment: Needs assistance Sitting-balance support: No upper extremity supported Sitting balance-Leahy Scale: Good     Standing balance support: Bilateral upper extremity supported, Reliant on assistive device for balance, Single extremity supported Standing balance-Leahy Scale: Poor Standing balance comment: RW to ambulate, pivots to bsc using bed rail or bsc rail                            Communication    Cognition Arousal: Alert Behavior During Therapy: [facility] for tasks assessed/performed  PT - Cognitive impairments: No apparent impairments                                Cueing    Exercises Other Exercises Other Exercises: Heel cord stretch 30 sec x 2 on R , soft tissue mobilization R foot for edema    General Comments General comments (skin integrity, edema, etc.): VSS.  Pt reports R foot pain ball of foot with weight bearing only.  Did note edema in R Foot (worse than L).  Educated on performing ankle pumps and toe curls in  addition to keeping elevated to assist with edema.  Added heel cord/plantar stretch manual and educated on doing with gait belt.      Pertinent Vitals/Pain Pain Assessment Pain Assessment: No/denies pain    Home Living                          Prior Function            PT Goals (current goals can now be found in the care plan section) Acute Rehab PT Goals Time For Goal Achievement: 09/28/23 Progress towards PT goals: Goals met and updated - see care plan    Frequency    Min 2X/week      PT Plan      Co-evaluation              AM-PAC PT "6 Clicks" Mobility   Outcome Measure  Help needed turning from your back to your side while in a flat bed without using bedrails?: A Little Help needed moving from lying on your back to sitting on the side of a flat bed without using bedrails?: A Little Help needed moving to and from a bed to a chair (including a wheelchair)?: A Little Help needed standing up from a chair using your arms (e.g., wheelchair or bedside chair)?: A Little Help needed to walk in hospital room?: A Little Help needed climbing 3-5 steps with a railing? : Total 6 Click Score: 16    End of Session Equipment Utilized During Treatment: Gait belt Activity Tolerance: Patient tolerated treatment well Patient left: with call bell/phone within reach;in bed;with bed alarm set Nurse Communication: Mobility status PT Visit Diagnosis: Unsteadiness on feet (R26.81);Other abnormalities of gait and mobility (R26.89);Muscle weakness (generalized) (M62.81);Pain Pain - Right/Left: Right Pain - part of body: Ankle and joints of foot     Time: 1426-1450 PT Time Calculation (min) (ACUTE ONLY): 24 min  Charges:    $Gait Training: 8-22 mins $Therapeutic Activity: 8-22 mins PT General Charges $$ ACUTE PT VISIT: 1 Visit                     Cyd Dowse, PT Acute Rehab [facility] [phone]    Carolynn Citrin 09/14/2023, 3:47 PM

## 2023-09-14 NOTE — TOC Progression Note (Signed)
 Transition of Care [facility]) - Progression Note   Patient Details  Name: Dorothy Harris MRN: [id number] Date of Birth: Nov 12, 1960  [facility] CM/SW Contact  Zenon Hilda, LCSW Phone Number: 09/14/2023, 11:13 AM  Clinical Narrative: CSW spoke with [facility] in admissions at [facility] and confirmed bed. Facility to start insurance authorization today. [name] if insurance is approved. CSW updated patient. TOC awaiting insurance authorization.  Expected Discharge Plan: Skilled Nursing Facility Barriers to Discharge: Insurance Authorization  Expected Discharge Plan and Services In-house Referral: Clinical Social Work Post Acute Care Choice: Skilled Nursing Facility Living arrangements for the past 2 months: Skilled Nursing Facility [name])           DME Arranged: N/A DME Agency: NA  Social Determinants of Health (SDOH) Interventions SDOH Screenings   Food Insecurity: Food Insecurity Present (09/03/2023)  Housing: High Risk (09/03/2023)  Transportation Needs: No Transportation Needs (09/03/2023)  Utilities: Not At Risk (09/03/2023)  Alcohol Screen: Low Risk  (05/07/2021)  Depression (PHQ2-9): Medium Risk (05/05/2023)  Financial Resource Strain: Low Risk  (05/07/2021)  Physical Activity: Inactive (05/07/2021)  Tobacco Use: High Risk (09/05/2023)   Readmission Risk Interventions    09/03/2023   10:08 AM 08/11/2023    8:27 AM 08/07/2023    7:29 AM  Readmission Risk Prevention Plan  Medication Screening   Complete  Transportation Screening Complete Complete Complete  PCP or Specialist Appt within 5-7 Days Complete    Home Care Screening Complete    Medication Review (RN CM) Complete    HRI or Home Care Consult  Complete   Social Work Consult for Recovery Care Planning/Counseling  Complete   Palliative Care Screening  Not Applicable   Medication Review Oceanographer)  Complete

## 2023-09-14 NOTE — Progress Notes (Signed)
 PROGRESS NOTE Dorothy Harris  UYQ:[id number] DOB: [birth year] DOA: 09/03/2023 PCP: [name], MD  Brief Narrative/Hospital Course: 63 y.o. 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  Recently admitted to [facility] from 5/1-5/24 for high-grade SBO status post ex lap with lysis of adhesions and resection of small bowel with end ileostomy revision on 5/15.  Also treated for AKI on CKD stage IV due to volume depletion/dehydration treated with IV fluids and acute anemia treated with a total of 3 units PRBCs. Started on oral metoprolol  for A-fib and Eliquis  was resumed and discharged to SNF. who presented to the ED from SNF for evaluation of leaking ileostomy bag and concern for sepsis on admission with imaging notable for multiple abdominal fluid collections and volume overload.   Surgery initially did not think fluid collections infected.  IR consulted for aspiration on 6/2.  Surgery still following.   Antibiotic discontinued, patient had AKI that improved with IV fluids seen by nephrology.  At this time clinically improved and stabilized surgery signed off. Once bed available, pt will be discharged to rehab.  Subjective: Seen and examined resting comfortably no new complaint Eager to go to rehab  Assessment and Plan:  Multiple abdominal fluid collections concerning for abscess vs seromas/hematomas Ileus versus low-grade SBO Ostomy leakage/with high output-nonspecific enteritis recent SBO s/p ex-lap with LOA/Resection of SB w/h end ileostomy revision on 5/15: Treated for mild cellulitis around incision AND fluid collection concerning- had repeat CT 6/1 with new wall thickening and inflammation of small bowel loops leading up to the RLQ ileostomy c/w nonspecific  enteritis  and Increasing mesenteric edema and interloop fluid.  Trace free fluid in the pelvis.  s/p US -guided right abdomen wall fluid collection aspiration-so far negative culture> per [facility]-Antibiotic discontinued as no concern for infection.Staples removed .Tolerating  Po and ileostomy output has improved continue fiber   Acute on chronic HFpEF Acute hypoxemic respiratory failure Pulmonary Edema Anasarca Volume Overload: Presented with fluid overload bilateral leg edema GGO on right middle and lower lobes on imaging Recent weight 86 kg on 5/4 on 5/29 103 KG> diuresed  w/ net negative and weight improved as below. Lasix  held due to AKI, s/p IV fluids infusion and boluses per nephrology and creatinine improved  Echo 08/2023 with EF 60-65%, no RWMA. Severe asymmetric LVH.  Monitor Net IO Since Admission: -8,962.18 mL [09/14/23 1018]  Filed Weights   09/12/23 0500 09/13/23 0500 09/14/23 0402  Weight: 86.8 kg 86.5 kg 85.8 kg    Recent Labs  Lab 09/08/23 0409 09/09/23 0358 09/10/23 0409 09/11/23 0340 09/12/23 0355 09/13/23 0343 09/14/23 0345  BUN 31* 36* 32* 27* 23 23 26*  CREATININE 2.51* 3.66* 3.15* 2.61* 2.36* 2.39* 2.39*  K 3.6 3.9 4.1 3.6 3.7 4.0 3.9  MG 1.7 1.8  --   --   --   --   --     SIRS Afebrile, but leukocytosis, tachycardic, tachypnic on admit> currently stable   Ground-glass opacities in the right middle and lower lobes: Suspecting due to volume overload rather than infection.  Diuresed.  Negative COVID flu and RSV.  Left upper extremity edema Acute Superficial Vein Thrombosis of L Cephalic Vein Cont supportive care   Elevated Troponin: Mildly up  no chest pain EKG with borderline ST elevation in lead III T wave inversions but  similar before. Tte as above.   QT prolongation QTc 597 on EKG and patient is on amiodarone .  Monitor electrolytes caution with QT prolonging medication QTc has improved   Paroxysmal A-fib: Rate controlled continue metoprolol . Heparin   drip transitioned to Eliquis  6/5    AKI on CKD stage IV recent baseline creatinine around 2-2.3 in May. Bump in creatinine in the setting of diuresis and high ileostomy output > seen by nephrology received aggressive fluid resuscitation and Lasix  held.  Creatinine has improved to baseline.   Recent Labs    09/05/23 0438 09/06/23 0338 09/07/23 0347 09/08/23 0409 09/09/23 0358 09/10/23 0409 09/11/23 0340 09/12/23 0355 09/13/23 0343 09/14/23 0345  BUN 21 23 26* 31* 36* 32* 27* 23 23 26*  CREATININE 2.17* 2.45* 2.92* 2.51* 3.66* 3.15* 2.61* 2.36* 2.39* 2.39*  CO2 25 26 23 24  21* 19* 19* 22 21* 22  K 3.7 3.7 3.9 3.6 3.9 4.1 3.6 3.7 4.0 3.9    Hypertension Stable on metoprolol .   Hyperlipidemia Continue Lipitor .   Anemia-suspect anemia of 20s and iron deficiency anemia Stable in 8 to 9 g.    GERD Continue Protonix .   Incidental stable AAA Patient will need outpatient follow-up with vascular surgery for follow-up CTA or MRA in 6 months   Deconditioning/debility: Awaiting for placement to SNF.   DVT prophylaxis: Place and maintain sequential compression device Start: 09/03/23 0902 Code Status:   Code Status: Limited: Do not attempt resuscitation (DNR) -DNR-LIMITED -Do Not Intubate/DNI  Family Communication: plan of care discussed with patient at bedside. Patient status is: Remains hospitalized because of severity of illness Level of care: Telemetry   Dispo: The patient is from: home            Anticipated disposition: pending SNF.  Medically stable discharge   Objective: Vitals last 24 hrs: Vitals:   09/13/23 1607 09/13/23 1946 09/14/23 0346 09/14/23 0402  BP: 131/89 138/83 (!) 137/90   Pulse: (!) 107 (!) 104 (!) 103   Resp: 16 20 20    Temp: 98.5 F (36.9 C) 98.8 F (37.1 C) 98.3 F (36.8 C)   TempSrc:      SpO2: 94% 96% 96%   Weight:    85.8 kg  Height:       Physical Examination: General exam: alert awake, oriented at baseline, older than stated  age HEENT:Oral mucosa moist, Ear/Nose WNL grossly Respiratory system: Bilaterally clear BS,no use of accessory muscle Cardiovascular system: S1 & S2 +, No JVD. Gastrointestinal system: Abdomen soft + oleostopmy+ ,NT,ND, BS+ Nervous System: Alert, awake, moving all extremities,and following commands. Extremities: LE edema neg,distal peripheral pulses palpable and warm.  Skin: No rashes,no icterus. MSK: Normal muscle bulk,tone, power    Data Reviewed: I have personally reviewed following labs and imaging studies ( see epic result tab) CBC: Recent Labs  Lab 09/08/23 0409 09/09/23 0358 09/10/23 0409  WBC 8.8 9.2 7.3  NEUTROABS  --  6.0  --   HGB 8.7* 8.8* 8.2*  HCT 28.5* 28.5* 27.8*  MCV 99.7 97.6 105.3*  PLT 337 292 278   CMP: Recent Labs  Lab 09/08/23 0409 09/09/23 0358 09/10/23 0409 09/11/23 0340 09/12/23 0355 09/13/23 0343 09/14/23 0345  NA 133* 137 137 140 138 139 138  K 3.6 3.9 4.1 3.6 3.7 4.0 3.9  CL 99 102 106 112* 108 109 108  CO2 24 21* 19* 19* 22 21* 22  GLUCOSE 110* 105* 94 111* 98 90 98  BUN 31* 36* 32* 27* 23 23  26*  CREATININE 2.51* 3.66* 3.15* 2.61* 2.36* 2.39* 2.39*  CALCIUM  8.6* 8.9 8.7* 8.7* 8.7* 8.6* 8.6*  MG 1.7 1.8  --   --   --   --   --   PHOS 2.9 3.4  --   --   --   --   --    GFR: Estimated Creatinine Clearance: 27.5 mL/min (A) (by C-G formula based on SCr of 2.39 mg/dL (H)). Recent Labs  Lab 09/08/23 0409 09/09/23 0358  AST 12* 15  ALT 6 <5  ALKPHOS 66 66  BILITOT 1.2 1.1  PROT 6.9 7.0  ALBUMIN  3.2* 3.1*   No results for input(s): "LIPASE", "AMYLASE" in the last 168 hours. No results for input(s): "AMMONIA" in the last 168 hours. Coagulation Profile: No results for input(s): "INR", "PROTIME" in the last 168 hours. Unresulted Labs (From admission, onward)    None      Antimicrobials/Microbiology: Anti-infectives (From admission, onward)    Start     Dose/Rate Route Frequency Ordered Stop   09/06/23 1030  piperacillin -tazobactam  (ZOSYN ) IVPB 3.375 g  Status:  Discontinued        3.375 g 12.5 mL/hr over 240 Minutes Intravenous Every 8 hours 09/06/23 1013 09/10/23 1355   09/04/23 1600  vancomycin  (VANCOCIN ) IVPB 1000 mg/200 mL premix  Status:  Discontinued        1,000 mg 200 mL/hr over 60 Minutes Intravenous Every 36 hours 09/03/23 0655 09/04/23 0815   09/04/23 1000  ceFAZolin  (ANCEF ) IVPB 2g/100 mL premix  Status:  Discontinued        2 g 200 mL/hr over 30 Minutes Intravenous Every 8 hours 09/04/23 0815 09/04/23 0901   09/04/23 1000  ceFAZolin  (ANCEF ) IVPB 2g/100 mL premix  Status:  Discontinued        2 g 200 mL/hr over 30 Minutes Intravenous Every 12 hours 09/04/23 0901 09/06/23 1004   09/03/23 1600  metroNIDAZOLE  (FLAGYL ) IVPB 500 mg  Status:  Discontinued        500 mg 100 mL/hr over 60 Minutes Intravenous Every 12 hours 09/03/23 0643 09/04/23 0815   09/03/23 1500  ceFEPIme  (MAXIPIME ) 2 g in sodium chloride  0.9 % 100 mL IVPB  Status:  Discontinued        2 g 200 mL/hr over 30 Minutes Intravenous Every 12 hours 09/03/23 0655 09/04/23 0815   09/03/23 0400  metroNIDAZOLE  (FLAGYL ) IVPB 500 mg        500 mg 100 mL/hr over 60 Minutes Intravenous  Once 09/03/23 0358 09/03/23 0535   09/03/23 0330  vancomycin  (VANCOREADY) IVPB 1750 mg/350 mL        1,750 mg 175 mL/hr over 120 Minutes Intravenous  Once 09/03/23 0325 09/03/23 0608   09/03/23 0300  ceFEPIme  (MAXIPIME ) 2 g in sodium chloride  0.9 % 100 mL IVPB        2 g 200 mL/hr over 30 Minutes Intravenous  Once 09/03/23 0251 09/03/23 0336         Component Value Date/Time   SDES ABSCESS 09/07/2023 1548   SPECREQUEST ABD 09/07/2023 1548   CULT  09/07/2023 1548    No growth aerobically or anaerobically. Performed at [facility], [address], [place], [place] [address]    REPTSTATUS 09/12/2023 FINAL 09/07/2023 1548    Procedures:  Medications reviewed:  Scheduled Meds:  amiodarone   200 mg Oral BID   apixaban   5 mg Oral BID   atorvastatin   80 mg  Oral Daily   Chlorhexidine   Gluconate Cloth  6 each Topical Daily   diclofenac  Sodium  2 g Topical BID   fiber  1 packet Oral BID   loperamide   2 mg Oral TID   pantoprazole   40 mg Oral Daily   Continuous Infusions:  Lesa Rape, MD [facility] 09/14/2023, 12:00 PM

## 2023-09-14 NOTE — Progress Notes (Signed)
 Subjective: No complaints of abdominal pain today. Tolerating soft diet without n/v. Having less ostomy output with medication additions    Objective: Vital signs in last 24 hours: Temp:  [98.3 F (36.8 C)-98.8 F (37.1 C)] 98.3 F (36.8 C) (06/09 0346) Pulse Rate:  [103-107] 103 (06/09 0346) Resp:  [16-20] 20 (06/09 0346) BP: (131-138)/(83-90) 137/90 (06/09 0346) SpO2:  [94 %-96 %] 96 % (06/09 0346) Weight:  [85.8 kg] 85.8 kg (06/09 0402) Last BM Date : 09/14/23  Intake/Output from previous day: 06/08 0701 - 06/09 0700 In: -  Out: 600 [Urine:200; Stool:400] Intake/Output this shift: Total I/O In: 120 [P.O.:120] Out: -   PE: Gen:  Alert, NAD Abd: Soft, ND, NT, midline incision cdi. Stoma viable with output present  Lab Results:  No results for input(s): "WBC", "HGB", "HCT", "PLT" in the last 72 hours.  BMET Recent Labs    09/13/23 0343 09/14/23 0345  NA 139 138  K 4.0 3.9  CL 109 108  CO2 21* 22  GLUCOSE 90 98  BUN 23 26*  CREATININE 2.39* 2.39*  CALCIUM  8.6* 8.6*   PT/INR No results for input(s): "LABPROT", "INR" in the last 72 hours. CMP     Component Value Date/Time   NA 138 09/14/2023 0345   NA 140 05/05/2023 0830   K 3.9 09/14/2023 0345   CL 108 09/14/2023 0345   CO2 22 09/14/2023 0345   GLUCOSE 98 09/14/2023 0345   BUN 26 (H) 09/14/2023 0345   BUN 31 (H) 05/05/2023 0830   CREATININE 2.39 (H) 09/14/2023 0345   CREATININE 1.69 (H) 08/21/2017 0935   CALCIUM  8.6 (L) 09/14/2023 0345   PROT 7.0 09/09/2023 0358   PROT 7.0 05/05/2023 0830   ALBUMIN  3.1 (L) 09/09/2023 0358   ALBUMIN  4.3 05/05/2023 0830   AST 15 09/09/2023 0358   ALT <5 09/09/2023 0358   ALKPHOS 66 09/09/2023 0358   BILITOT 1.1 09/09/2023 0358   BILITOT 0.4 05/05/2023 0830   GFRNONAA 22 (L) 09/14/2023 0345   GFRNONAA 33 (L) 08/21/2017 0935   GFRAA 53 (L) 10/07/2017 0720   GFRAA 39 (L) 08/21/2017 0935   Lipase     Component Value Date/Time   LIPASE 31 08/06/2023  2148    Studies/Results: No results found.   Anti-infectives: Anti-infectives (From admission, onward)    Start     Dose/Rate Route Frequency Ordered Stop   09/06/23 1030  piperacillin -tazobactam (ZOSYN ) IVPB 3.375 g  Status:  Discontinued        3.375 g 12.5 mL/hr over 240 Minutes Intravenous Every 8 hours 09/06/23 1013 09/10/23 1355   09/04/23 1600  vancomycin  (VANCOCIN ) IVPB 1000 mg/200 mL premix  Status:  Discontinued        1,000 mg 200 mL/hr over 60 Minutes Intravenous Every 36 hours 09/03/23 0655 09/04/23 0815   09/04/23 1000  ceFAZolin  (ANCEF ) IVPB 2g/100 mL premix  Status:  Discontinued        2 g 200 mL/hr over 30 Minutes Intravenous Every 8 hours 09/04/23 0815 09/04/23 0901   09/04/23 1000  ceFAZolin  (ANCEF ) IVPB 2g/100 mL premix  Status:  Discontinued        2 g 200 mL/hr over 30 Minutes Intravenous Every 12 hours 09/04/23 0901 09/06/23 1004   09/03/23 1600  metroNIDAZOLE  (FLAGYL ) IVPB 500 mg  Status:  Discontinued        500 mg 100 mL/hr over 60 Minutes Intravenous Every 12 hours 09/03/23 0643 09/04/23 0815  09/03/23 1500  ceFEPIme  (MAXIPIME ) 2 g in sodium chloride  0.9 % 100 mL IVPB  Status:  Discontinued        2 g 200 mL/hr over 30 Minutes Intravenous Every 12 hours 09/03/23 0655 09/04/23 0815   09/03/23 0400  metroNIDAZOLE  (FLAGYL ) IVPB 500 mg        500 mg 100 mL/hr over 60 Minutes Intravenous  Once 09/03/23 0358 09/03/23 0535   09/03/23 0330  vancomycin  (VANCOREADY) IVPB 1750 mg/350 mL        1,750 mg 175 mL/hr over 120 Minutes Intravenous  Once 09/03/23 0325 09/03/23 0608   09/03/23 0300  ceFEPIme  (MAXIPIME ) 2 g in sodium chloride  0.9 % 100 mL IVPB        2 g 200 mL/hr over 30 Minutes Intravenous  Once 09/03/23 0251 09/03/23 0336        Assessment/Plan POD 24 - s/p ex lap, SBR, LOA, ileostomy revision, VHR at [facility] by Dr. Collene Dawson on 5/15 for Small bowel obstruction, ventral hernia with incarceration and contained small bowel perforation with mesenteric  abscess, parastomal hernia, adhesions  - CT 6/1 w/ wall thickening and inflammation of the small bowel loops leading to ileostomy c/f enteritis; mesenteric edema and interloop fluid without free air, trace fluid in the pelvis, and subcutaneous fluid in RLQ - S/p IR aspiration of abdominal wall fluid collection 6/2 - IR aspiration cx NGTD. Abx per primary - Afebrile. WBC wnl. Now tolerating diet without abdominal pain, n/v, having ostomy output and with benign abdominal exam - All staples removed per notes 6/3 - Ostomy output improved. GI panel neg. Cont fiber and Imodium . - WBC count normalized, does not need further antibiotics from general surgery standpoint, can restart her DOAC and would be able to be discharged from a surgical standpoint with follow up as an outpatient with Dr. Collene Dawson soon.   -surgically stable, we will sign off at this time as ostomy output is well controlled with fiber/imodium    FEN - Soft diet, on metamucil and imodium  VTE - SCDs, Eliquis  ID - Zosyn  dc'd on 6/5  ?possible pneumonia/pneumonitis  CHFpEF HLD Hx aortic dissection Obesity CKD IV Afib  I reviewed nursing notes, hospitalist notes, last 24 h vitals and pain scores, last 48 h intake and output, last 24 h labs and trends, and last 24 h imaging results.   LOS: 11 days    Leone Ralphs, [facility] 09/14/2023, 10:40 AM Please see Amion for pager number during day hours 7:00am-4:30pm

## 2023-09-15 DIAGNOSIS — I5033 Acute on chronic diastolic (congestive) heart failure: Secondary | ICD-10-CM | POA: Diagnosis not present

## 2023-09-15 NOTE — Progress Notes (Signed)
 PROGRESS NOTE Dorothy Harris  NGE:[id number] DOB: 14-Sep-1960 DOA: 09/03/2023 PCP: [name], MD  Brief Narrative/Hospital Course: 63 y.o. 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  Recently admitted to [facility] from 5/1-5/24 for high-grade SBO status post ex lap with lysis of adhesions and resection of small bowel with end ileostomy revision on 5/15.  Also treated for AKI on CKD stage IV due to volume depletion/dehydration treated with IV fluids and acute anemia treated with a total of 3 units PRBCs. Started on oral metoprolol  for A-fib and Eliquis  was resumed and discharged to SNF. who presented to the ED from SNF for evaluation of leaking ileostomy bag and concern for sepsis on admission with imaging notable for multiple abdominal fluid collections and volume overload.   Surgery initially did not think fluid collections infected.  IR consulted for aspiration on 6/2.  Surgery still following.   Antibiotic discontinued, patient had AKI that improved with IV fluids seen by nephrology.  At this time clinically improved and stabilized surgery signed off. Once bed available, pt will be discharged to rehab.  Subjective: Seen and examined No new complaints Overnight no acute events.  Vitally stable Waiting for SNF bed  Assessment and Plan:  Multiple abdominal fluid collections concerning for abscess vs seromas/hematomas Ileus versus low-grade SBO Ostomy leakage/with high output-nonspecific enteritis recent SBO s/p ex-lap with LOA/Resection of SB w/h end ileostomy revision on 5/15: Treated for mild cellulitis around incision AND fluid collection concerning- had repeat CT 6/1 with new wall thickening and inflammation of small bowel loops leading up to the RLQ  ileostomy c/w nonspecific enteritis  and Increasing mesenteric edema and interloop fluid.  Trace free fluid in the pelvis.  s/p US -guided right abdomen wall fluid collection aspiration-so far negative culture> per [facility]-Antibiotic discontinued as no concern for infection.Staples removed .Tolerating  Po and ileostomy output has improved continue fiber   Acute on chronic HFpEF Acute hypoxemic respiratory failure Pulmonary Edema Anasarca Volume Overload: Presented with fluid overload bilateral leg edema GGO on right middle and lower lobes on imaging Recent weight 86 kg on 5/4 on 5/29 103 KG> diuresed  w/ net negative and weight improved as below. Lasix  held due to AKI, s/p IV fluids infusion and boluses per nephrology and creatinine improved  Echo 08/2023 with EF 60-65%, no RWMA. Severe asymmetric LVH.  Monitor Net IO Since Admission: -9,147.18 mL [09/15/23 1251]  Filed Weights   09/13/23 0500 09/14/23 0402 09/15/23 0603  Weight: 86.5 kg 85.8 kg 85.9 kg    Recent Labs  Lab 09/09/23 0358 09/10/23 0409 09/11/23 0340 09/12/23 0355 09/13/23 0343 09/14/23 0345  BUN 36* 32* 27* 23 23 26*  CREATININE 3.66* 3.15* 2.61* 2.36* 2.39* 2.39*  K 3.9 4.1 3.6 3.7 4.0 3.9  MG 1.8  --   --   --   --   --     SIRS Afebrile, but leukocytosis, tachycardic, tachypnic on admit> currently stable   Ground-glass opacities in the right middle and lower lobes: Suspecting due to volume overload rather than infection.  Diuresed.  Negative COVID flu and RSV.  Left upper extremity edema Acute Superficial Vein Thrombosis of L Cephalic Vein Cont supportive care   Elevated Troponin: Mildly up  no chest pain EKG with borderline ST elevation in lead III T wave inversions but similar before.  Tte as above.   QT prolongation QTc 597 on EKG and patient is on amiodarone .  Monitor electrolytes caution with QT prolonging medication QTc has improved   Paroxysmal A-fib: Rate controlled continue metoprolol . Heparin  drip  transitioned to Eliquis  6/5    AKI on CKD stage IV recent baseline creatinine around 2-2.3 in May. Bump in creatinine in the setting of diuresis and high ileostomy output > seen by nephrology received aggressive fluid resuscitation and Lasix  held.  Creatinine has improved to baseline.   Recent Labs    09/05/23 0438 09/06/23 0338 09/07/23 0347 09/08/23 0409 09/09/23 0358 09/10/23 0409 09/11/23 0340 09/12/23 0355 09/13/23 0343 09/14/23 0345  BUN 21 23 26* 31* 36* 32* 27* 23 23 26*  CREATININE 2.17* 2.45* 2.92* 2.51* 3.66* 3.15* 2.61* 2.36* 2.39* 2.39*  CO2 25 26 23 24  21* 19* 19* 22 21* 22  K 3.7 3.7 3.9 3.6 3.9 4.1 3.6 3.7 4.0 3.9    Hypertension Stable on metoprolol .   Hyperlipidemia Continue Lipitor .   Anemia-suspect anemia of 20s and iron deficiency anemia Stable in 8 to 9 g.    GERD Continue Protonix .   Incidental stable AAA Patient will need outpatient follow-up with vascular surgery for follow-up CTA or MRA in 6 months   Deconditioning/debility: Awaiting for placement to SNF.   DVT prophylaxis: Place and maintain sequential compression device Start: 09/03/23 0902 Code Status:   Code Status: Limited: Do not attempt resuscitation (DNR) -DNR-LIMITED -Do Not Intubate/DNI  Family Communication: plan of care discussed with patient at bedside. Patient status is: Remains hospitalized because of severity of illness Level of care: Telemetry   Dispo: The patient is from: home            Anticipated disposition: pending SNF.  Medically stable discharge   Objective: Vitals last 24 hrs: Vitals:   09/14/23 0402 09/14/23 1937 09/15/23 0603 09/15/23 0608  BP:  (!) 138/95  (!) 131/94  Pulse:  100  (!) 102  Resp:  18  18  Temp:  99 F (37.2 C)  98.7 F (37.1 C)  TempSrc:  Oral  Oral  SpO2:  96%  95%  Weight: 85.8 kg  85.9 kg   Height:       Physical Examination: General exam: alert awake HEENT:Oral mucosa moist, Ear/Nose WNL grossly Respiratory system: Bilaterally  clear BS,no use of accessory muscle Cardiovascular system: S1 & S2 +, No JVD. Gastrointestinal system: Abdomen soft, colostomy+, NT,ND, BS+ Nervous System: Alert, awake, moving all extremities,and following commands. Extremities: LE edema neg,distal peripheral pulses palpable and warm.  Skin: No rashes,no icterus. MSK: Normal muscle bulk,tone, power    Data Reviewed: I have personally reviewed following labs and imaging studies ( see epic result tab) CBC: Recent Labs  Lab 09/09/23 0358 09/10/23 0409  WBC 9.2 7.3  NEUTROABS 6.0  --   HGB 8.8* 8.2*  HCT 28.5* 27.8*  MCV 97.6 105.3*  PLT 292 278   CMP: Recent Labs  Lab 09/09/23 0358 09/10/23 0409 09/11/23 0340 09/12/23 0355 09/13/23 0343 09/14/23 0345  NA 137 137 140 138 139 138  K 3.9 4.1 3.6 3.7 4.0 3.9  CL 102 106 112* 108 109 108  CO2 21* 19* 19* 22 21* 22  GLUCOSE 105* 94 111* 98 90 98  BUN 36* 32* 27* 23 23 26*  CREATININE 3.66* 3.15* 2.61* 2.36* 2.39* 2.39*  CALCIUM  8.9 8.7* 8.7* 8.7* 8.6* 8.6*  MG 1.8  --   --   --   --   --  PHOS 3.4  --   --   --   --   --    GFR: Estimated Creatinine Clearance: 27.5 mL/min (A) (by C-G formula based on SCr of 2.39 mg/dL (H)). Recent Labs  Lab 09/09/23 0358  AST 15  ALT <5  ALKPHOS 66  BILITOT 1.1  PROT 7.0  ALBUMIN  3.1*   No results for input(s): "LIPASE", "AMYLASE" in the last 168 hours. No results for input(s): "AMMONIA" in the last 168 hours. Coagulation Profile: No results for input(s): "INR", "PROTIME" in the last 168 hours. Unresulted Labs (From admission, onward)    None      Antimicrobials/Microbiology: Anti-infectives (From admission, onward)    Start     Dose/Rate Route Frequency Ordered Stop   09/06/23 1030  piperacillin -tazobactam (ZOSYN ) IVPB 3.375 g  Status:  Discontinued        3.375 g 12.5 mL/hr over 240 Minutes Intravenous Every 8 hours 09/06/23 1013 09/10/23 1355   09/04/23 1600  vancomycin  (VANCOCIN ) IVPB 1000 mg/200 mL premix  Status:   Discontinued        1,000 mg 200 mL/hr over 60 Minutes Intravenous Every 36 hours 09/03/23 0655 09/04/23 0815   09/04/23 1000  ceFAZolin  (ANCEF ) IVPB 2g/100 mL premix  Status:  Discontinued        2 g 200 mL/hr over 30 Minutes Intravenous Every 8 hours 09/04/23 0815 09/04/23 0901   09/04/23 1000  ceFAZolin  (ANCEF ) IVPB 2g/100 mL premix  Status:  Discontinued        2 g 200 mL/hr over 30 Minutes Intravenous Every 12 hours 09/04/23 0901 09/06/23 1004   09/03/23 1600  metroNIDAZOLE  (FLAGYL ) IVPB 500 mg  Status:  Discontinued        500 mg 100 mL/hr over 60 Minutes Intravenous Every 12 hours 09/03/23 0643 09/04/23 0815   09/03/23 1500  ceFEPIme  (MAXIPIME ) 2 g in sodium chloride  0.9 % 100 mL IVPB  Status:  Discontinued        2 g 200 mL/hr over 30 Minutes Intravenous Every 12 hours 09/03/23 0655 09/04/23 0815   09/03/23 0400  metroNIDAZOLE  (FLAGYL ) IVPB 500 mg        500 mg 100 mL/hr over 60 Minutes Intravenous  Once 09/03/23 0358 09/03/23 0535   09/03/23 0330  vancomycin  (VANCOREADY) IVPB 1750 mg/350 mL        1,750 mg 175 mL/hr over 120 Minutes Intravenous  Once 09/03/23 0325 09/03/23 0608   09/03/23 0300  ceFEPIme  (MAXIPIME ) 2 g in sodium chloride  0.9 % 100 mL IVPB        2 g 200 mL/hr over 30 Minutes Intravenous  Once 09/03/23 0251 09/03/23 0336         Component Value Date/Time   SDES ABSCESS 09/07/2023 1548   SPECREQUEST ABD 09/07/2023 1548   CULT  09/07/2023 1548    No growth aerobically or anaerobically. Performed at [facility], [address] [place], [place] [address]    REPTSTATUS 09/12/2023 FINAL 09/07/2023 1548    Procedures:  Medications reviewed:  Scheduled Meds:  amiodarone   200 mg Oral BID   apixaban   5 mg Oral BID   atorvastatin   80 mg Oral Daily   Chlorhexidine  Gluconate Cloth  6 each Topical Daily   diclofenac  Sodium  2 g Topical BID   fiber  1 packet Oral BID   loperamide   2 mg Oral TID   pantoprazole   40 mg Oral Daily   Continuous  Infusions:  Lesa Rape, MD  [facility] 09/15/2023, 12:52 PM

## 2023-09-15 NOTE — TOC Progression Note (Signed)
 Transition of Care [facility]) - Progression Note   Patient Details  Name: Dorothy Harris MRN: [id number] Date of Birth: Jun 06, 1960  [facility] of [facility]) CM/SW Contact  Zenon Hilda, LCSW Phone Number: 09/15/2023, 3:22 PM  Clinical Narrative: CSW followed up with Christy in admissions at [facility]. Per [facility], insurance authorization is still pending but she emailed the admission paperwork to the patient. TOC awaiting insurance approval.  Expected Discharge Plan: Skilled Nursing Facility Barriers to Discharge: Insurance Authorization  Expected Discharge Plan and Services In-house Referral: Clinical Social Work Post Acute Care Choice: Skilled Nursing Facility Living arrangements for the past 2 months: Skilled Nursing Facility [name])            DME Arranged: N/A DME Agency: NA  Social Determinants of Health (SDOH) Interventions SDOH Screenings   Food Insecurity: Food Insecurity Present (09/03/2023)  Housing: High Risk (09/03/2023)  Transportation Needs: No Transportation Needs (09/03/2023)  Utilities: Not At Risk (09/03/2023)  Alcohol Screen: Low Risk  (05/07/2021)  Depression (PHQ2-9): Medium Risk (05/05/2023)  Financial Resource Strain: Low Risk  (05/07/2021)  Physical Activity: Inactive (05/07/2021)  Tobacco Use: High Risk (09/05/2023)   Readmission Risk Interventions    09/03/2023   10:08 AM 08/11/2023    8:27 AM 08/07/2023    7:29 AM  Readmission Risk Prevention Plan  Medication Screening   Complete  Transportation Screening Complete Complete Complete  PCP or Specialist Appt within 5-7 Days Complete    Home Care Screening Complete    Medication Review (RN CM) Complete    HRI or Home Care Consult  Complete   Social Work Consult for Recovery Care Planning/Counseling  Complete   Palliative Care Screening  Not Applicable   Medication Review [employer])  Complete

## 2023-09-16 DIAGNOSIS — I5033 Acute on chronic diastolic (congestive) heart failure: Secondary | ICD-10-CM | POA: Diagnosis not present

## 2023-09-16 LAB — CBC
HCT: 30.5 % — ABNORMAL LOW (ref 36.0–46.0)
Hemoglobin: 9.2 g/dL — ABNORMAL LOW (ref 12.0–15.0)
MCH: 30.8 pg (ref 26.0–34.0)
MCHC: 30.2 g/dL (ref 30.0–36.0)
MCV: 102 fL — ABNORMAL HIGH (ref 80.0–100.0)
Platelets: 345 10*3/uL (ref 150–400)
RBC: 2.99 MIL/uL — ABNORMAL LOW (ref 3.87–5.11)
RDW: 16.1 % — ABNORMAL HIGH (ref 11.5–15.5)
WBC: 8.3 10*3/uL (ref 4.0–10.5)
nRBC: 0 % (ref 0.0–0.2)

## 2023-09-16 LAB — BASIC METABOLIC PANEL WITH GFR
Anion gap: 11 (ref 5–15)
BUN: 21 mg/dL (ref 8–23)
CO2: 20 mmol/L — ABNORMAL LOW (ref 22–32)
Calcium: 9.3 mg/dL (ref 8.9–10.3)
Chloride: 108 mmol/L (ref 98–111)
Creatinine, Ser: 2.45 mg/dL — ABNORMAL HIGH (ref 0.44–1.00)
GFR, Estimated: 22 mL/min — ABNORMAL LOW (ref >60)
Glucose, Bld: 142 mg/dL — ABNORMAL HIGH (ref 70–99)
Potassium: 4.1 mmol/L (ref 3.5–5.1)
Sodium: 139 mmol/L (ref 135–145)

## 2023-09-16 MED ORDER — SODIUM CHLORIDE 0.9 % IV BOLUS
500.0000 mL | Freq: Once | INTRAVENOUS | Status: AC
  Administered 2023-09-16: 500 mL via INTRAVENOUS

## 2023-09-16 MED ORDER — METOPROLOL TARTRATE 25 MG PO TABS
25.0000 mg | ORAL_TABLET | Freq: Two times a day (BID) | ORAL | Status: DC
  Administered 2023-09-16 – 2023-09-17 (×3): 25 mg via ORAL
  Filled 2023-09-16 (×3): qty 1

## 2023-09-16 NOTE — Progress Notes (Signed)
 PROGRESS NOTE Dorothy Harris  ZOX:[id number] DOB: 02-16-1961 DOA: 09/03/2023 PCP: [name], MD  Brief Narrative/Hospital Course: 63 y.o. 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  Recently admitted to [facility] from 5/1-5/24 for high-grade SBO status post ex lap with lysis of adhesions and resection of small bowel with end ileostomy revision on 5/15.  Also treated for AKI on CKD stage IV due to volume depletion/dehydration treated with IV fluids and acute anemia treated with a total of 3 units PRBCs. Started on oral metoprolol  for A-fib and Eliquis  was resumed and discharged to SNF. who presented to the ED from SNF for evaluation of leaking ileostomy bag and concern for sepsis on admission with imaging notable for multiple abdominal fluid collections and volume overload.   Surgery initially did not think fluid collections infected.  IR consulted for aspiration on 6/2.  Surgery still following.   Antibiotic discontinued, patient had AKI that improved with IV fluids seen by nephrology.  At this time clinically improved and stabilized surgery signed off. Once bed available, pt will be discharged to rehab.  Subjective: Seen and examined Resting comfortably No complaints Overnight remains afebrile BP stable she is waiting for placement no new changes   Assessment and Plan:  Multiple abdominal fluid collections concerning for abscess vs seromas/hematomas Ileus versus low-grade SBO Ostomy leakage/with high output-nonspecific enteritis recent SBO s/p ex-lap with LOA/Resection of SB w/h end ileostomy revision on 5/15: Treated for mild cellulitis around incision AND fluid collection concerning- had repeat CT 6/1 with new wall thickening and inflammation of small  bowel loops leading up to the RLQ ileostomy c/w nonspecific enteritis  and Increasing mesenteric edema and interloop fluid.  Trace free fluid in the pelvis.  s/p US -guided right abdomen wall fluid collection aspiration-so far negative culture> per [facility]-Antibiotic discontinued as no concern for infection.Staples removed .Tolerating  Po and ileostomy output has improved continue fiber   Acute on chronic HFpEF Acute hypoxemic respiratory failure Pulmonary Edema Anasarca Volume Overload: Presented with fluid overload bilateral leg edema GGO on right middle and lower lobes on imaging Recent weight 86 kg on 5/4 on 5/29 103 KG> diuresed  w/ net negative and weight improved as below. Lasix  held due to AKI, s/p IV fluids infusion and boluses per nephrology and creatinine improved  Echo 08/2023 with EF 60-65%, no RWMA. Severe asymmetric LVH.  Monitor Net IO Since Admission: -9,587.18 mL [09/16/23 0854]  Filed Weights   09/14/23 0402 09/15/23 0603 09/16/23 0500  Weight: 85.8 kg 85.9 kg 85.4 kg    Recent Labs  Lab 09/10/23 0409 09/11/23 0340 09/12/23 0355 09/13/23 0343 09/14/23 0345  BUN 32* 27* 23 23 26*  CREATININE 3.15* 2.61* 2.36* 2.39* 2.39*  K 4.1 3.6 3.7 4.0 3.9    SIRS Afebrile, but leukocytosis, tachycardic, tachypnic on admit> currently stable   Ground-glass opacities in the right middle and lower lobes: Suspecting due to volume overload rather than infection.  Diuresed.  Negative COVID flu and RSV.  Left upper extremity edema Acute Superficial Vein Thrombosis of L Cephalic Vein Cont supportive care   Elevated Troponin: Mildly up  no chest pain EKG with borderline ST elevation in lead III T wave inversions but similar before. Tte as above.   QT prolongation QTc 597 on EKG and patient is on amiodarone .  Monitor electrolytes  caution with QT prolonging medication QTc has improved   Paroxysmal A-fib: Rate controlled continuem metoprolol , amiodarone  eliquis  6/5    AKI on CKD  stage IV recent baseline creatinine around 2-2.3 in May. Bump in creatinine in the setting of diuresis and high ileostomy output > seen by nephrology received aggressive fluid resuscitation and Lasix  held.  Creatinine has improved to baseline.   Recent Labs    09/05/23 0438 09/06/23 0338 09/07/23 0347 09/08/23 0409 09/09/23 0358 09/10/23 0409 09/11/23 0340 09/12/23 0355 09/13/23 0343 09/14/23 0345  BUN 21 23 26* 31* 36* 32* 27* 23 23 26*  CREATININE 2.17* 2.45* 2.92* 2.51* 3.66* 3.15* 2.61* 2.36* 2.39* 2.39*  CO2 25 26 23 24  21* 19* 19* 22 21* 22  K 3.7 3.7 3.9 3.6 3.9 4.1 3.6 3.7 4.0 3.9    Hypertension Stable on metoprolol .   Hyperlipidemia Continue Lipitor .   Anemia-suspect anemia of 20s and iron deficiency anemia Stable in 8 to 9 g.    GERD Continue Protonix .   Incidental stable AAA Patient will need outpatient follow-up with vascular surgery for follow-up CTA or MRA in 6 months   Deconditioning/debility: Awaiting for placement to SNF.   DVT prophylaxis: Place and maintain sequential compression device Start: 09/03/23 0902 Code Status:   Code Status: Limited: Do not attempt resuscitation (DNR) -DNR-LIMITED -Do Not Intubate/DNI  Family Communication: plan of care discussed with patient at bedside. Patient status is: Remains hospitalized because of severity of illness Level of care: Telemetry   Dispo: The patient is from: home            Anticipated disposition: pending SNF.  Medically stable discharge.  Objective: Vitals last 24 hrs: Vitals:   09/15/23 1451 09/15/23 1956 09/16/23 0500 09/16/23 0500  BP: 126/84 (!) 130/91  (!) 122/91  Pulse: (!) 107 (!) 102  (!) 101  Resp: 18 15  16   Temp: 98.7 F (37.1 C) 98.3 F (36.8 C)  98 F (36.7 C)  TempSrc: Oral     SpO2: 96% 98%  95%  Weight:   85.4 kg   Height:       Physical Examination: General exam: alert awake, oriented at baseline, older than stated age HEENT:Oral mucosa moist, Ear/Nose WNL  grossly Respiratory system: Bilaterally clear BS,no use of accessory muscle Cardiovascular system: S1 & S2 +, No JVD. Gastrointestinal system: Abdomen soft, colostomy in place ND, BS+ Nervous System: Alert, awake, moving all extremities,and following commands. Extremities: LE edema neg,distal peripheral pulses palpable and warm.  Skin: No rashes,no icterus. MSK: Normal muscle bulk,tone, power   Data Reviewed: I have personally reviewed following labs and imaging studies ( see epic result tab) CBC: Recent Labs  Lab 09/10/23 0409  WBC 7.3  HGB 8.2*  HCT 27.8*  MCV 105.3*  PLT 278   CMP: Recent Labs  Lab 09/10/23 0409 09/11/23 0340 09/12/23 0355 09/13/23 0343 09/14/23 0345  NA 137 140 138 139 138  K 4.1 3.6 3.7 4.0 3.9  CL 106 112* 108 109 108  CO2 19* 19* 22 21* 22  GLUCOSE 94 111* 98 90 98  BUN 32* 27* 23 23 26*  CREATININE 3.15* 2.61* 2.36* 2.39* 2.39*  CALCIUM  8.7* 8.7* 8.7* 8.6* 8.6*   GFR: Estimated Creatinine Clearance: 27.4 mL/min (A) (by C-G formula based on SCr of 2.39 mg/dL (H)). No results for input(s): AST, ALT, ALKPHOS, BILITOT, PROT, ALBUMIN  in the last 168 hours.  No results for input(s): LIPASE, AMYLASE in the last 168 hours. No results  for input(s): AMMONIA in the last 168 hours. Coagulation Profile: No results for input(s): INR, PROTIME in the last 168 hours. Unresulted Labs (From admission, onward)    None      Antimicrobials/Microbiology: Anti-infectives (From admission, onward)    Start     Dose/Rate Route Frequency Ordered Stop   09/06/23 1030  piperacillin -tazobactam (ZOSYN ) IVPB 3.375 g  Status:  Discontinued        3.375 g 12.5 mL/hr over 240 Minutes Intravenous Every 8 hours 09/06/23 1013 09/10/23 1355   09/04/23 1600  vancomycin  (VANCOCIN ) IVPB 1000 mg/200 mL premix  Status:  Discontinued        1,000 mg 200 mL/hr over 60 Minutes Intravenous Every 36 hours 09/03/23 0655 09/04/23 0815   09/04/23 1000  ceFAZolin   (ANCEF ) IVPB 2g/100 mL premix  Status:  Discontinued        2 g 200 mL/hr over 30 Minutes Intravenous Every 8 hours 09/04/23 0815 09/04/23 0901   09/04/23 1000  ceFAZolin  (ANCEF ) IVPB 2g/100 mL premix  Status:  Discontinued        2 g 200 mL/hr over 30 Minutes Intravenous Every 12 hours 09/04/23 0901 09/06/23 1004   09/03/23 1600  metroNIDAZOLE  (FLAGYL ) IVPB 500 mg  Status:  Discontinued        500 mg 100 mL/hr over 60 Minutes Intravenous Every 12 hours 09/03/23 0643 09/04/23 0815   09/03/23 1500  ceFEPIme  (MAXIPIME ) 2 g in sodium chloride  0.9 % 100 mL IVPB  Status:  Discontinued        2 g 200 mL/hr over 30 Minutes Intravenous Every 12 hours 09/03/23 0655 09/04/23 0815   09/03/23 0400  metroNIDAZOLE  (FLAGYL ) IVPB 500 mg        500 mg 100 mL/hr over 60 Minutes Intravenous  Once 09/03/23 0358 09/03/23 0535   09/03/23 0330  vancomycin  (VANCOREADY) IVPB 1750 mg/350 mL        1,750 mg 175 mL/hr over 120 Minutes Intravenous  Once 09/03/23 0325 09/03/23 0608   09/03/23 0300  ceFEPIme  (MAXIPIME ) 2 g in sodium chloride  0.9 % 100 mL IVPB        2 g 200 mL/hr over 30 Minutes Intravenous  Once 09/03/23 0251 09/03/23 0336         Component Value Date/Time   SDES ABSCESS 09/07/2023 1548   SPECREQUEST ABD 09/07/2023 1548   CULT  09/07/2023 1548    No growth aerobically or anaerobically. Performed at [facility], [address], [place], [place] [address]    REPTSTATUS 09/12/2023 FINAL 09/07/2023 1548    Procedures:  Medications reviewed:  Scheduled Meds:  amiodarone   200 mg Oral BID   apixaban   5 mg Oral BID   atorvastatin   80 mg Oral Daily   Chlorhexidine  Gluconate Cloth  6 each Topical Daily   diclofenac  Sodium  2 g Topical BID   fiber  1 packet Oral BID   loperamide   2 mg Oral TID   pantoprazole   40 mg Oral Daily   Continuous Infusions:  Lesa Rape, MD [facility] 09/16/2023, 11:35 AM

## 2023-09-16 NOTE — Progress Notes (Signed)
 Physical Therapy Treatment Patient Details Name: Dorothy Harris MRN: [id number] DOB: 1960/12/31 Today's Date: 09/16/2023   History of Present Illness Dorothy Harris is a 63 y.o. 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  Recently admitted to [facility] from 5/1-5/24 for high-grade SBO status post ex lap with lysis of adhesions and resection of small bowel with end ileostomy revision on 5/15.  Also treated for AKI on CKD stage IV due to volume depletion/dehydration treated with IV fluids and acute anemia treated with a total of 3 units PRBCs.  Started on oral metoprolol  for A-fib and Eliquis  was resumed on discharge and was discharged to SNF. ED presentation with leaking iliostomy bag. Re-admitted to [facility] on 09/03/23 with dx CHF, ARF, pulmonary edema.    PT Comments  Pt progressed gait distance but HR up to 173 bpm and BP elevated 181/107 when returned to sitting.   Both quickly returned to resting values.  Notified RN.  Pt needs cues for transfer techniques and min A.  Cont POC with recommendation for Patient will benefit from continued inpatient follow up therapy, <3 hours/day    If plan is discharge home, recommend the following: Help with stairs or ramp for entrance;Assistance with cooking/housework;A little help with bathing/dressing/bathroom;A little help with walking and/or transfers   Can travel by private vehicle     No  Equipment Recommendations  None recommended by PT    Recommendations for Other Services       Precautions / Restrictions Precautions Precautions: Fall Precaution/Restrictions Comments: ostomy     Mobility  Bed Mobility               General bed mobility comments: Pt was received seated in the bedside chair    Transfers Overall transfer level: Needs  assistance Equipment used: Rolling walker (2 wheels) Transfers: Sit to/from Stand Sit to Stand: Min assist     Squat pivot transfers: Min assist     General transfer comment: Increased time, STS x 2, cues to lean forward and use momentum    Ambulation/Gait Ambulation/Gait assistance: Min assist Gait Distance (Feet): 60 Feet Assistive device: Rolling walker (2 wheels) Gait Pattern/deviations: Step-to pattern, Decreased stride length, Shuffle, Decreased weight shift to right Gait velocity: decreased     General Gait Details: Educated on step to gait for R LE   Stairs             Wheelchair Mobility     Tilt Bed    Modified Rankin (Stroke Patients Only)       Balance Overall balance assessment: Needs assistance Sitting-balance support: No upper extremity supported Sitting balance-Leahy Scale: Good     Standing balance support: Bilateral upper extremity supported, Reliant on assistive device for balance, Single extremity supported Standing balance-Leahy Scale: Poor Standing balance comment: CGA with RW                            Communication Communication Communication: No apparent difficulties  Cognition Arousal: Alert Behavior During Therapy: Flat affect   PT - Cognitive impairments: No apparent impairments                                Cueing    Exercises  General Comments General comments (skin integrity, edema, etc.): Pt's reports lightheaded last 10' of ambulation.  HR noted to be 173 bpm.  Had pt return to sitting.  HR quickly (<30 sec) returning to 120's then down to 105 bpm after 2-3 mins.  BP was 181/107 taken within <1 min of sitting.  After 5 mins sitting down to 158/96.  Notified RN .  Held further exercise due to lunch arrived, pt fatigued, and elevated HR wtih activity.      Pertinent Vitals/Pain Pain Assessment Pain Assessment: 0-10 Pain Score: 2  Pain Location: R foot w weight bearing Pain Descriptors  / Indicators: Dull Pain Intervention(s): Limited activity within patient's tolerance, Monitored during session    Home Living                          Prior Function            PT Goals (current goals can now be found in the care plan section) Progress towards PT goals: Progressing toward goals    Frequency    Min 2X/week      PT Plan      Co-evaluation              AM-PAC PT 6 Clicks Mobility   Outcome Measure  Help needed turning from your back to your side while in a flat bed without using bedrails?: A Little Help needed moving from lying on your back to sitting on the side of a flat bed without using bedrails?: A Little Help needed moving to and from a bed to a chair (including a wheelchair)?: A Little Help needed standing up from a chair using your arms (e.g., wheelchair or bedside chair)?: A Lot Help needed to walk in hospital room?: A Lot Help needed climbing 3-5 steps with a railing? : A Lot 6 Click Score: 15    End of Session Equipment Utilized During Treatment: Gait belt Activity Tolerance: Treatment limited secondary to medical complications (Comment) Patient left: with call bell/phone within reach;in chair Nurse Communication: Mobility status PT Visit Diagnosis: Unsteadiness on feet (R26.81);Other abnormalities of gait and mobility (R26.89);Muscle weakness (generalized) (M62.81);Pain Pain - Right/Left: Right Pain - part of body: Ankle and joints of foot     Time: 1610-9604 PT Time Calculation (min) (ACUTE ONLY): 14 min  Charges:    $Gait Training: 8-22 mins PT General Charges $$ ACUTE PT VISIT: 1 Visit                     Cyd Dowse, PT Acute Rehab Services [facility] [phone]    Carolynn Citrin 09/16/2023, 1:08 PM

## 2023-09-16 NOTE — TOC Progression Note (Signed)
 Transition of Care [facility]) - Progression Note   Patient Details  Name: Dorothy Harris MRN: [id number] Date of Birth: 04-07-1961  [facility]) CM/SW Contact  Zenon Hilda, LCSW Phone Number: 09/16/2023, 10:58 AM  Clinical Narrative: CSW notified 09/15/23 by Kathaleen Pale in admissions at [facility] that the patient's insurance company called her to inform her the patient's insurance is out of network with [facility]. Patient was notified and she planned to review bed offers with her sister. CSW met with patient today and [facility] has chosen [facility] in [place]. CSW confirmed bed with [place] in admissions and the facility will start the insurance authorization today. TOC awaiting insurance approval.  Expected Discharge Plan: Skilled Nursing Facility Barriers to Discharge: Insurance Authorization  Expected Discharge Plan and Services In-house Referral: Clinical Social Work Post Acute Care Choice: Skilled Nursing Facility Living arrangements for the past 2 months: Skilled Nursing Facility [name])           DME Arranged: N/A DME Agency: NA  Social Determinants of Health (SDOH) Interventions SDOH Screenings   Food Insecurity: Food Insecurity Present (09/03/2023)  Housing: High Risk (09/03/2023)  Transportation Needs: No Transportation Needs (09/03/2023)  Utilities: Not At Risk (09/03/2023)  Alcohol Screen: Low Risk  (05/07/2021)  Depression (PHQ2-9): Medium Risk (05/05/2023)  Financial Resource Strain: Low Risk  (05/07/2021)  Physical Activity: Inactive (05/07/2021)  Tobacco Use: High Risk (09/05/2023)   Readmission Risk Interventions    09/03/2023   10:08 AM 08/11/2023    8:27 AM 08/07/2023    7:29 AM  Readmission Risk Prevention Plan  Medication Screening   Complete  Transportation Screening Complete Complete Complete  PCP or Specialist Appt within 5-7 Days Complete    Home Care Screening Complete    Medication Review (RN CM) Complete    HRI or Home Care Consult   Complete   Social Work Consult for Recovery Care Planning/Counseling  Complete   Palliative Care Screening  Not Applicable   Medication Review Oceanographer)  Complete

## 2023-09-16 NOTE — Progress Notes (Signed)
 Occupational Therapy Treatment Patient Details Name: Dorothy Harris MRN: [id number] DOB: 1960-05-22 Today's Date: 09/16/2023   History of present illness Dorothy Harris is a 63 yr old female who was recently admitted to [facility] from 5/1-5/24 for high-grade SBO status post exploratory laparotomy with lysis of adhesions and resection of small bowel with end ileostomy revision on 5/15.  Also treated for AKI on CKD stage IV, due to volume depletion/dehydration treated with IV fluids and acute anemia treated with a total of 3 units PRBCs.  Started on oral metoprolol  for A-fib and Eliquis  was resumed on discharge and was discharged to SNF. ED presentation with leaking ileostomy bag. Re-admitted to [facility] on 09/03/23 with CHF, ARF, and pulmonary edema. 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   OT comments  The pt was received seated in the bedside chair and was agreeable to therapy participation. She was instructed on compensatory strategies for performing lower body dressing and bathing tasks. OT also reinforced general precautions to protect abdominal surgical site. She subsequently required min assist to stand using a RW, as well as for short distance ambulation in her room. She reported feelings of generalized fatigue with progressive activity, as well as pain in her R foot during standing activities. Continue OT plan of care. The pt resides alone, therefore would benefit from post-hospital short-term SNF rehab to help maximize her independence with managing self-care tasks & to facilitate her safe return home alone.       If plan is discharge home, recommend the following:  A little help with walking and/or transfers;Assistance with cooking/housework   Equipment Recommendations  Other (comment) (defer  to next level of care)    Recommendations for Other Services      Precautions / Restrictions Precautions Precautions: Fall Precaution/Restrictions Comments: ostomy Restrictions Weight Bearing Restrictions Per Provider Order: No       Mobility Bed Mobility      General bed mobility comments: Pt was received seated in the bedside chair    Transfers Overall transfer level: Needs assistance Equipment used: Rolling walker (2 wheels) Transfers: Sit to/from Stand Sit to Stand: Min assist                 Balance     Sitting balance-Leahy Scale: Good         Standing balance comment: CGA with RW             ADL either performed or assessed with clinical judgement   ADL                 Lower Body Bathing Details (indicate cue type and reason): OT provided verbal recommendations for added safety and ways to optimize ability to perform lower body bathing. Specifically, OT educated the pt on use of a shower seat for bathing, as well as implementing the figure 4 technique to perform lower body bathing tasks.     Lower Body Dressing: Minimal assistance;Sitting/lateral leans Lower Body Dressing Details (indicate cue type and reason): The pt was instructed on implementing the figure 4 technique for lower body dressing tasks, to protect surgical site and to ensure appropriate healing. She subsequently provided teach back for doffing and donning her socks while seated in the bedside chair.  Communication Communication Communication: No apparent difficulties   Cognition Arousal: Alert Behavior During Therapy: WFL for tasks assessed/performed Cognition: No apparent impairments                               Following commands: Intact                      Pertinent Vitals/ Pain       Pain Assessment Pain Location: The pt denied having pain at rest, however reported pain in her R foot with standing  activity. She stated this occured after she received a spinal injection previously. Pain Intervention(s): Monitored during session, Limited activity within patient's tolerance   Frequency  Min 2X/week        Progress Toward Goals  OT Goals(current goals can now be found in the care plan section)  Progress towards OT goals: Progressing toward goals  Acute Rehab OT Goals OT Goal Formulation: With patient Time For Goal Achievement: 09/18/23 Potential to Achieve Goals: Good  Plan         AM-PAC OT 6 Clicks Daily Activity     Outcome Measure   Help from another person eating meals?: None Help from another person taking care of personal grooming?: A Little Help from another person toileting, which includes using toliet, bedpan, or urinal?: A Little Help from another person bathing (including washing, rinsing, drying)?: A Little Help from another person to put on and taking off regular upper body clothing?: A Little Help from another person to put on and taking off regular lower body clothing?: A Little 6 Click Score: 19    End of Session Equipment Utilized During Treatment: Rolling walker (2 wheels)  OT Visit Diagnosis: Unsteadiness on feet (R26.81);Muscle weakness (generalized) (M62.81);Pain Pain - Right/Left: Right Pain - part of body:  (foot)   Activity Tolerance Patient tolerated treatment well   Patient Left in chair;with call bell/phone within reach   Nurse Communication Mobility status        Time: 1610-9604 OT Time Calculation (min): 9 min  Charges: OT General Charges $OT Visit: 1 Visit OT Treatments $Self Care/Home Management : 8-22 mins     Sheralyn Dies, OTR/L 09/16/2023, 11:28 AM

## 2023-09-17 DIAGNOSIS — I5033 Acute on chronic diastolic (congestive) heart failure: Secondary | ICD-10-CM | POA: Diagnosis not present

## 2023-09-17 MED ORDER — OXYCODONE HCL 5 MG PO TABS: 5.0000 mg | ORAL_TABLET | Freq: Four times a day (QID) | ORAL | 0 refills | Status: AC | PRN

## 2023-09-17 MED ORDER — METOPROLOL TARTRATE 25 MG PO TABS: 25.0000 mg | ORAL_TABLET | Freq: Two times a day (BID) | ORAL | Status: DC

## 2023-09-17 NOTE — Plan of Care (Signed)
 Problem: Education: Goal: Knowledge of General Education information will improve Description: Including pain rating scale, medication(s)/side effects and non-pharmacologic comfort measures 09/17/2023 1357 by Coletta Davidson, RN Outcome: Adequate for Discharge 09/17/2023 1116 by Coletta Davidson, RN Outcome: Progressing   Problem: Health Behavior/Discharge Planning: Goal: Ability to manage health-related needs will improve 09/17/2023 1357 by Coletta Davidson, RN Outcome: Adequate for Discharge 09/17/2023 1116 by Coletta Davidson, RN Outcome: Progressing   Problem: Clinical Measurements: Goal: Ability to maintain clinical measurements within normal limits will improve 09/17/2023 1357 by Coletta Davidson, RN Outcome: Adequate for Discharge 09/17/2023 1116 by Coletta Davidson, RN Outcome: Progressing Goal: Will remain free from infection 09/17/2023 1357 by Coletta Davidson, RN Outcome: Adequate for Discharge 09/17/2023 1116 by Coletta Davidson, RN Outcome: Progressing Goal: Diagnostic test results will improve 09/17/2023 1357 by Coletta Davidson, RN Outcome: Adequate for Discharge 09/17/2023 1116 by Coletta Davidson, RN Outcome: Progressing Goal: Respiratory complications will improve 09/17/2023 1357 by Coletta Davidson, RN Outcome: Adequate for Discharge 09/17/2023 1116 by Coletta Davidson, RN Outcome: Progressing Goal: Cardiovascular complication will be avoided 09/17/2023 1357 by Coletta Davidson, RN Outcome: Adequate for Discharge 09/17/2023 1116 by Coletta Davidson, RN Outcome: Progressing   Problem: Activity: Goal: Risk for activity intolerance will decrease 09/17/2023 1357 by Coletta Davidson, RN Outcome: Adequate for Discharge 09/17/2023 1116 by Coletta Davidson, RN Outcome: Progressing   Problem: Nutrition: Goal: Adequate nutrition will be maintained 09/17/2023 1357 by Coletta Davidson, RN Outcome:  Adequate for Discharge 09/17/2023 1116 by Coletta Davidson, RN Outcome: Progressing   Problem: Coping: Goal: Level of anxiety will decrease 09/17/2023 1357 by Coletta Davidson, RN Outcome: Adequate for Discharge 09/17/2023 1116 by Coletta Davidson, RN Outcome: Progressing   Problem: Elimination: Goal: Will not experience complications related to bowel motility 09/17/2023 1357 by Coletta Davidson, RN Outcome: Adequate for Discharge 09/17/2023 1116 by Coletta Davidson, RN Outcome: Progressing Goal: Will not experience complications related to urinary retention 09/17/2023 1357 by Coletta Davidson, RN Outcome: Adequate for Discharge 09/17/2023 1116 by Coletta Davidson, RN Outcome: Progressing   Problem: Pain Managment: Goal: General experience of comfort will improve and/or be controlled 09/17/2023 1357 by Coletta Davidson, RN Outcome: Adequate for Discharge 09/17/2023 1116 by Coletta Davidson, RN Outcome: Progressing   Problem: Safety: Goal: Ability to remain free from injury will improve 09/17/2023 1357 by Coletta Davidson, RN Outcome: Adequate for Discharge 09/17/2023 1116 by Coletta Davidson, RN Outcome: Progressing   Problem: Skin Integrity: Goal: Risk for impaired skin integrity will decrease 09/17/2023 1357 by Coletta Davidson, RN Outcome: Adequate for Discharge 09/17/2023 1116 by Coletta Davidson, RN Outcome: Progressing   Problem: Clinical Measurements: Goal: Ability to avoid or minimize complications of infection will improve 09/17/2023 1357 by Coletta Davidson, RN Outcome: Adequate for Discharge 09/17/2023 1116 by Coletta Davidson, RN Outcome: Progressing   Problem: Skin Integrity: Goal: Skin integrity will improve 09/17/2023 1357 by Coletta Davidson, RN Outcome: Adequate for Discharge 09/17/2023 1116 by Coletta Davidson, RN Outcome: Progressing   Problem: Education: Goal: Ability to demonstrate  management of disease process will improve 09/17/2023 1357 by Coletta Davidson, RN Outcome: Adequate for Discharge 09/17/2023 1116 by Coletta Davidson, RN Outcome: Progressing Goal: Ability to verbalize understanding of medication therapies will improve 09/17/2023 1357 by Coletta Davidson, RN Outcome: Adequate for Discharge 09/17/2023 1116 by Coletta Davidson, RN Outcome: Progressing  Goal: Individualized Educational Video(s) 09/17/2023 1357 by Coletta Davidson, RN Outcome: Adequate for Discharge 09/17/2023 1116 by Coletta Davidson, RN Outcome: Progressing   Problem: Activity: Goal: Capacity to carry out activities will improve 09/17/2023 1357 by Coletta Davidson, RN Outcome: Adequate for Discharge 09/17/2023 1116 by Coletta Davidson, RN Outcome: Progressing   Problem: Cardiac: Goal: Ability to achieve and maintain adequate cardiopulmonary perfusion will improve 09/17/2023 1357 by Coletta Davidson, RN Outcome: Adequate for Discharge 09/17/2023 1116 by Coletta Davidson, RN Outcome: Progressing

## 2023-09-17 NOTE — TOC Transition Note (Signed)
 Transition of Care [facility]) - Discharge Note  Patient Details  Name: Dorothy Harris MRN: [id number] Date of Birth: 1960-04-11  Transition of Care [facility]) CM/SW Contact:  Zenon Hilda, LCSW Phone Number: 09/17/2023, 1:16 PM  Clinical Narrative: CSW notified by [place] with [facility] that insurance authorization was approved. CSW updated patient and patient reported she will notify her sisters. Discharge summary, discharge orders, and SNF transfer report faxed to facility in hub. Patient will go to room 142A and the number for report is [phone]. Medical necessity form done; PTAR scheduled. Discharge packet completed. RN updated. TOC signing off.  Final next level of care: Skilled Nursing Facility Barriers to Discharge: Barriers Resolved  Patient Goals and CMS Choice Patient states their goals for this hospitalization and ongoing recovery are:: To return back to SNF or home CMS Medicare.gov Compare Post Acute Care list provided to:: Patient Choice offered to / list presented to : Patient  Discharge Placement Existing PASRR number confirmed : 09/10/23          Patient chooses bed at: [facility] Patient to be transferred to facility by: PTAR Patient and family notified of of transfer: 09/17/23  Discharge Plan and Services Additional resources added to the After Visit Summary for   In-house Referral: Clinical Social Work Post Acute Care Choice: Skilled Nursing Facility          DME Arranged: N/A DME Agency: NA  Social Drivers of Health (SDOH) Interventions SDOH Screenings   Food Insecurity: Food Insecurity Present (09/03/2023)  Housing: High Risk (09/03/2023)  Transportation Needs: No Transportation Needs (09/03/2023)  Utilities: Not At Risk (09/03/2023)  Alcohol Screen: Low Risk  (05/07/2021)  Depression (PHQ2-9): Medium Risk (05/05/2023)  Financial Resource Strain: Low Risk  (05/07/2021)  Physical Activity: Inactive (05/07/2021)  Tobacco Use: High Risk (09/05/2023)    Readmission Risk Interventions    09/03/2023   10:08 AM 08/11/2023    8:27 AM 08/07/2023    7:29 AM  Readmission Risk Prevention Plan  Medication Screening   Complete  Transportation Screening Complete Complete Complete  PCP or Specialist Appt within 5-7 Days Complete    Home Care Screening Complete    Medication Review (RN CM) Complete    HRI or Home Care Consult  Complete   Social Work Consult for Recovery Care Planning/Counseling  Complete   Palliative Care Screening  Not Applicable   Medication Review Oceanographer)  Complete

## 2023-09-17 NOTE — Discharge Summary (Signed)
 Dorothy Harris:[id number] DOB: [birth year] DOA: 09/03/2023  PCP: [name], MD  Admit date: 09/03/2023 Discharge date: 09/17/2023  Time spent: 35 minutes  Recommendations for Outpatient Follow-up:  Pcp f/u Nephrology f/u 1 month (call and schedule) General surgery f/u few weeks (call and schedule) Check kidney function in about 1 week  Outpatient surveillance of incidental AAA    Discharge Diagnoses:  Principal Problem:   Acute on chronic heart failure with preserved ejection fraction (HFpEF) (HCC) Active Problems:   Acute hypoxemic respiratory failure (HCC)   PAF (paroxysmal atrial fibrillation) (HCC)   Essential hypertension   Stage 4 chronic kidney disease (HCC)   Ileus (HCC)   Edema of left upper extremity   QT prolongation   Sepsis (HCC)   Discharge Condition: improved  Diet recommendation: heart healthy  Filed Weights   09/15/23 0603 09/16/23 0500 09/17/23 0721  Weight: 85.9 kg 85.4 kg 87.6 kg    History of present illness:  From admission h and p Dorothy Harris is a 63 y.o. 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  Recently admitted to [facility] from 5/1-5/24 for high-grade SBO status post ex lap with lysis of adhesions and resection of small bowel with end ileostomy revision on 5/15.  Also treated for AKI on CKD stage IV due to volume depletion/dehydration treated with IV fluids and acute anemia treated with a total of 3 units PRBCs.  Started on oral metoprolol  for A-fib and Eliquis  was resumed on discharge.  He was discharged to SNF.   Patient presents to the ED today from SNF for evaluation of leaking colostomy bag.  Found to be tachycardic to the 130s, tachypneic to 30s, and hypoxic to 80s on room air and EMS gave 2 DuoNeb  treatments prior to arrival.  Vital signs on arrival to the ED: Temperature 98.6 F, pulse 113, respiratory rate 29, blood pressure 162/116.  In the ED, patient noted to be desatting to 90% on room air and was placed on 2 L [place].  Labs showing WBC count 15.4, hemoglobin 9.1 (stable), creatinine 2.0 (stable), troponin 27, BNP 2422, COVID/influenza/RSV PCR negative, blood cultures in process, lactic acid normal x 2, MRSA PCR screen pending.  Chest x-ray showing low lung volumes with mild bibasilar and mild right lung atelectasis and/or infiltrate.  Hospital Course:   Multiple abdominal fluid collections concerning for abscess vs seromas/hematomas Ileus versus low-grade SBO Ostomy leakage/with high output-nonspecific enteritis recent SBO s/p ex-lap with LOA/Resection of SB w/h end ileostomy revision on 5/15: Treated for mild cellulitis around incision AND fluid collection concerning- had repeat CT 6/1 with new wall thickening and inflammation of small bowel loops leading up to the RLQ ileostomy c/w nonspecific enteritis  and Increasing mesenteric edema and interloop fluid.  Trace free fluid in the pelvis.  s/p US -guided right abdomen wall fluid collection aspiration-snegative culture> per [facility]-Antibiotic discontinued as no concern for infection.Staples removed .Tolerating  Po and ileostomy output has improved continue fiber F/u gen surg dr. Collene Dawson   Acute on chronic HFpEF Acute hypoxemic respiratory failure Pulmonary Edema Anasarca Volume Overload: Presented with fluid overload bilateral leg edema GGO on right middle and lower lobes on imaging Recent weight 86 kg on 5/4 on 5/29 103 KG> diuresed  w/ net negative and weight improved as below. Lasix  held due to AKI,  s/p IV fluids infusion and boluses per nephrology and creatinine improved  Echo 08/2023 with EF 60-65%, no RWMA. Severe asymmetric LVH.   SIRS Afebrile, but leukocytosis, tachycardic, tachypnic on admit> currently stable   Ground-glass  opacities in the right middle and lower lobes: Suspecting due to volume overload rather than infection.  Diuresed.  Negative COVID flu and RSV.   Left upper extremity edema Acute Superficial Vein Thrombosis of L Cephalic Vein Cont supportive care   Elevated Troponin: Mildly up  no chest pain EKG with borderline ST elevation in lead III T wave inversions but similar before. Tte as above.   QT prolongation QTc 597 on EKG and patient is on amiodarone .  Monitor electrolytes caution with QT prolonging medication QTc has improved   Paroxysmal A-fib: Rate controlled continuem metoprolol , amiodarone  eliquis  6/5    AKI on CKD stage IV recent baseline creatinine around 2-2.3 in May. Bump in creatinine in the setting of diuresis and high ileostomy output > seen by nephrology received aggressive fluid resuscitation and Lasix  held.  Creatinine has improved to baseline.    Hypertension Stable on metoprolol .   Hyperlipidemia Continue Lipitor .   Anemia-suspect anemia of 20s and iron deficiency anemia Stable in 8 to 9 g.     GERD Continue Protonix .   Incidental stable AAA Patient will need outpatient follow-up with vascular surgery for follow-up CTA or MRA in 6 months    Deconditioning/debility: To SNF  Procedures: See above   Consultations: Nephrology, general surgery  Discharge Exam: Vitals:   09/16/23 2003 09/17/23 0547  BP: 127/80 (!) 115/90  Pulse: 92 73  Resp: 14 14  Temp: 98.4 F (36.9 C) 98.3 F (36.8 C)  SpO2: 96% 97%    General: NAD Cardiovascular: RRR Respiratory: CTAB Abdomen: soft, healed midline scar, ostomy draining stool  Discharge Instructions   Discharge Instructions     Diet - low sodium heart healthy   Complete by: As directed    Discharge wound care:   Complete by: As directed    Routine ostomy care   Increase activity slowly   Complete by: As directed       Allergies as of 09/17/2023       Reactions   Chlorthalidone  Other (See  Comments)   ELEVATED KIDNEY FUNCTION         Medication List     STOP taking these medications    polyethylene glycol 17 g packet Commonly known as: MiraLax        TAKE these medications    amiodarone  200 MG tablet Commonly known as: PACERONE  1 po BID thru 09/17/23 then 1 po daily   apixaban  5 MG Tabs tablet Commonly known as: ELIQUIS  Take 1 tablet (5 mg total) by mouth 2 (two) times daily.   atorvastatin  80 MG tablet Commonly known as: LIPITOR  Take 1 tablet (80 mg total) by mouth daily.   feeding supplement (PRO-STAT 64) Liqd Take 30 mLs by mouth daily at 6 (six) AM.   FeroSul 325 (65 Fe) MG tablet Generic drug: ferrous sulfate  Take 1 tablet (325 mg total) by mouth daily with breakfast.   fiber Pack packet Take 1 packet by mouth 2 (two) times daily.   loperamide  2 MG capsule Commonly known as: IMODIUM  Take 1 capsule (2 mg total) by mouth 3 (three) times daily.   melatonin 3 MG Tabs tablet Take 2 tablets (6 mg total) by mouth at bedtime as needed.   metoprolol  tartrate 25 MG tablet Commonly known as: LOPRESSOR  Take  1 tablet (25 mg total) by mouth 2 (two) times daily. What changed:  medication strength how much to take   multivitamin with minerals Tabs tablet Take 1 tablet by mouth daily.   oxyCODONE  5 MG immediate release tablet Commonly known as: Oxy IR/ROXICODONE  Take 1-2 tablets (5-10 mg total) by mouth every 6 (six) hours as needed for up to 5 days for moderate pain (pain score 4-6) or severe pain (pain score 7-10).   pantoprazole  40 MG tablet Commonly known as: Protonix  Take 1 tablet (40 mg total) by mouth daily.               Discharge Care Instructions  (From admission, onward)           Start     Ordered   09/17/23 0000  Discharge wound care:       Comments: Routine ostomy care   09/17/23 1206           Allergies  Allergen Reactions   Chlorthalidone  Other (See Comments)    ELEVATED KIDNEY FUNCTION     Contact  information for follow-up providers     [name], MD Follow up.   Specialty: Internal Medicine Contact information: [address] [place] [address] [phone]         Charley Constable, MD Follow up.   Specialty: Nephrology Why: in about 1 month Contact information: [address] [place] [address] [phone]         Maudine Sos, MD Follow up.   Specialty: Cardiology Why: in a few weeks, call to schedule appointment Contact information: [address] [place] [address] [phone]              Contact information for after-discharge care     Destination     [facility] .   Service: Skilled Nursing Contact information: [address] [place]  [address] [phone]                      The results of significant diagnostics from this hospitalization (including imaging, microbiology, ancillary and laboratory) are listed below for reference.    Significant Diagnostic Studies: US  RENAL Result Date: 09/09/2023 CLINICAL DATA:  [id number] Acute renal failure superimposed on stage 4 chronic kidney disease (HCC) [id number] EXAM: RENAL / URINARY TRACT ULTRASOUND COMPLETE COMPARISON:  September 06, 2023 FINDINGS: Right Kidney: Renal measurements: 9.6 x 4.5 x 4.7 cm = volume: 105 mL. Cortical thinning with increased echogenicity. No mass. No hydronephrosis or nephrolithiasis. Left Kidney: Renal measurements: 9.4 x 5.6 x 4.6 cm = volume: 127 mL. Cortical thinning with increased echogenicity. No mass. No hydronephrosis or nephrolithiasis. Bladder: Circumferential wall thickening of the urinary bladder. Other: None. IMPRESSION: 1. Technically difficult exam due to patient's body habitus and overlying bowel gas. Otherwise, no hydronephrosis or nephrolithiasis. Renal parenchymal changes consistent with chronic medical renal disease. 2. Circumferential wall thickening of the urinary bladder, which may be due to  underdistension or acute cystitis. Correlation with urinalysis is recommended. Electronically Signed   By: Rance Burrows M.D.   On: 09/09/2023 17:16   US  FNA SOFT TISSUE Result Date: 09/07/2023 INDICATION: 63 year old female with history of abdominal wall fluid collection. EXAM: Ultrasound-guided abdominal wall fluid collection aspiration. MEDICATIONS: The patient is currently admitted to the hospital and receiving intravenous antibiotics. The antibiotics were administered within an appropriate time frame prior to the initiation of the procedure. ANESTHESIA/SEDATION: None. COMPLICATIONS: None immediate. PROCEDURE: Informed written consent  was obtained from the patient after a thorough discussion of the procedural risks, benefits and alternatives. All questions were addressed. Maximal Sterile Barrier Technique was utilized including caps, mask, sterile gowns, sterile gloves, sterile drape, hand hygiene and skin antiseptic. A timeout was performed prior to the initiation of the procedure. Preprocedure ultrasound evaluation demonstrated multifocal intra-abdominal hematomas and ovoid fluid collection just inferior to the right abdominal wall stoma. Procedure was planned. Subdermal Local anesthesia was administered at the planned needle entry site. Under direct ultrasound visualization, a 19 gauge Yueh needle was directed into the collection and aspiration was performed yielding approximately 5 mL of serosanguineous fluid. The sample was sent to the lab for culture. The needle was removed. Postprocedure ultrasound evaluation demonstrated no evidence of surrounding hematoma or other complicating features. A sterile bandage was applied. The patient was transferred to the floor in good condition. IMPRESSION: Technically successful ultrasound-guided right abdominal wall fluid collection aspiration. Creasie Doctor, MD [facility] Electronically Signed   By: Creasie Doctor M.D.   On: 09/07/2023 16:42   CT ABDOMEN PELVIS WO CONTRAST Result Date: 09/06/2023 CLINICAL DATA:  Postoperative abdominal pain with shortness of breath. Lysis of adhesions on 08/20/2023 with ventral hernia repair. EXAM: CT ABDOMEN AND PELVIS WITHOUT CONTRAST TECHNIQUE: Multidetector CT imaging of the abdomen and pelvis was performed following the standard protocol without IV contrast. RADIATION DOSE REDUCTION: This exam was performed according to the departmental dose-optimization program which includes automated exposure control, adjustment of the mA and/or kV according to patient size and/or use of iterative reconstruction technique. COMPARISON:  CT abdomen and pelvis 09/03/2023 FINDINGS: Lower chest: There is atelectasis in the bilateral lower lobes. There are trace bilateral pleural effusions which have decreased. Thoracic aortic graft/stent again seen measuring up to 5 cm. Hepatobiliary: Gallstones are present. There is no biliary ductal dilatation. Liver is within normal limits. Pancreas: Unremarkable. No pancreatic ductal dilatation or surrounding inflammatory changes. Spleen: Normal in size without focal abnormality. Adrenals/Urinary Tract: Adrenal glands are unremarkable. Kidneys are normal, without renal calculi, focal lesion, or hydronephrosis. Bladder is unremarkable. Stomach/Bowel: Right lower quadrant ileostomy is again seen. There is wall thickening and inflammation of small bowel loops leading up to the ostomy which is new from prior. There is no evidence for bowel obstruction. The stomach is nondilated. Diffuse mesenteric edema and interloop fluid has increased compared to the prior study. There is no free air or pneumatosis. Vascular/Lymphatic: Infrarenal abdominal aortic aneurysm measuring 5.2 cm appears unchanged. There are atherosclerotic calcifications of the aorta. No enlarged lymph nodes are seen. Reproductive: Uterus and adnexa are within normal limits. Other: Trace free fluid in  the pelvis is unchanged. Focal area of fat attenuation with scattered areas of internal fluid in the anterior right lower quadrant measures 5.9 x 15 cm and appears unchanged. There is no focal abdominal wall hernia. Subcutaneous fluid collection in the right anterior abdominal wall measures 1.4 x 8.4 by 1.9 cm and has minimally decreased in size. Anterior abdominal wall hematomas measuring up to 3.8 cm have not significantly changed. Body wall edema and skin thickening at the level of the hips appear unchanged. Musculoskeletal: No acute osseous findings. IMPRESSION: 1. New wall thickening and inflammation of small bowel loops leading up to the right lower quadrant ileostomy. Findings are compatible with nonspecific enteritis including infectious, inflammatory and ischemic etiologies. 2. Increasing mesenteric edema and interloop fluid. No free air. 3. Stable trace free fluid in the pelvis. 4. Stable focal fat  attenuation with scattered areas of internal fluid in the anterior right lower quadrant. Fat necrosis is a possibility. 5. Stable anterior abdominal wall hematomas. 6. Stable infrarenal abdominal aortic aneurysm measuring 5.2 cm. Recommend follow-up every 6 months and vascular consultation. 7. Decreased trace bilateral pleural effusions. 8. Stable body wall edema and skin thickening at the level of the hips. 9. Aortic atherosclerosis. Aortic Atherosclerosis (ICD10-I70.0). Electronically Signed   By: Tyron Gallon M.D.   On: 09/06/2023 17:22   VAS US  LOWER EXTREMITY VENOUS (DVT) Result Date: 09/03/2023  Lower Venous DVT Study Patient Name:  EIKO MCGOWEN [facility]  Date of Exam:   09/03/2023 Medical Rec #: [id number]      Accession #:    [id number] Date of Birth: 1960/11/28      Patient Gender: F Patient Age:   63 years Exam Location:  [facility] Procedure:      VAS US  LOWER EXTREMITY VENOUS (DVT) Referring Phys: A POWELL JR --------------------------------------------------------------------------------   Indications: Swelling.  Risk Factors: None identified. Limitations: Poor ultrasound/tissue interface. Comparison Study: No prior studies. Performing Technologist: Lerry Ransom RVT  Examination Guidelines: A complete evaluation includes B-mode imaging, spectral Doppler, color Doppler, and power Doppler as needed of all accessible portions of each vessel. Bilateral testing is considered an integral part of a complete examination. Limited examinations for reoccurring indications may be performed as noted. The reflux portion of the exam is performed with the patient in reverse Trendelenburg.  +---------+---------------+---------+-----------+----------+--------------+ RIGHT    CompressibilityPhasicitySpontaneityPropertiesThrombus Aging +---------+---------------+---------+-----------+----------+--------------+ CFV      Full           Yes      Yes                                 +---------+---------------+---------+-----------+----------+--------------+ SFJ      Full                                                        +---------+---------------+---------+-----------+----------+--------------+ FV Prox  Full                                                        +---------+---------------+---------+-----------+----------+--------------+ FV Mid   Full                                                        +---------+---------------+---------+-----------+----------+--------------+ FV DistalFull                                                        +---------+---------------+---------+-----------+----------+--------------+ PFV      Full                                                        +---------+---------------+---------+-----------+----------+--------------+  POP      Full           Yes      Yes                                 +---------+---------------+---------+-----------+----------+--------------+ PTV      Full                                                         +---------+---------------+---------+-----------+----------+--------------+ PERO     Full                                                        +---------+---------------+---------+-----------+----------+--------------+   +---------+---------------+---------+-----------+----------+--------------+ LEFT     CompressibilityPhasicitySpontaneityPropertiesThrombus Aging +---------+---------------+---------+-----------+----------+--------------+ CFV      Full           Yes      Yes                                 +---------+---------------+---------+-----------+----------+--------------+ SFJ      Full                                                        +---------+---------------+---------+-----------+----------+--------------+ FV Prox  Full                                                        +---------+---------------+---------+-----------+----------+--------------+ FV Mid   Full                                                        +---------+---------------+---------+-----------+----------+--------------+ FV DistalFull                                                        +---------+---------------+---------+-----------+----------+--------------+ PFV      Full                                                        +---------+---------------+---------+-----------+----------+--------------+ POP      Full           Yes      Yes                                 +---------+---------------+---------+-----------+----------+--------------+  PTV      Full                                                        +---------+---------------+---------+-----------+----------+--------------+ PERO     Full                                                        +---------+---------------+---------+-----------+----------+--------------+     Summary: RIGHT: - There is no evidence of deep vein thrombosis in the lower extremity.  - No cystic  structure found in the popliteal fossa.  LEFT: - There is no evidence of deep vein thrombosis in the lower extremity.  - No cystic structure found in the popliteal fossa.  *See table(s) above for measurements and observations. Electronically signed by Runell Countryman on 09/03/2023 at 6:18:34 PM.    Final    VAS US  UPPER EXTREMITY VENOUS DUPLEX Result Date: 09/03/2023 UPPER VENOUS STUDY  Patient Name:  SHEALEIGH DUNSTAN [facility]  Date of Exam:   09/03/2023 Medical Rec #: [id number]      Accession #:    [id number] Date of Birth: March 29, 1961      Patient Gender: F Patient Age:   39 years Exam Location:  [facility] Procedure:      VAS US  UPPER EXTREMITY VENOUS DUPLEX Referring Phys: Corky Diener RATHORE --------------------------------------------------------------------------------  Indications: Swelling Risk Factors: None identified. Comparison Study: No prior studies. Performing Technologist: Lerry Ransom RVT  Examination Guidelines: A complete evaluation includes B-mode imaging, spectral Doppler, color Doppler, and power Doppler as needed of all accessible portions of each vessel. Bilateral testing is considered an integral part of a complete examination. Limited examinations for reoccurring indications may be performed as noted.  Right Findings: +----------+------------+---------+-----------+----------+-------+ RIGHT     CompressiblePhasicitySpontaneousPropertiesSummary +----------+------------+---------+-----------+----------+-------+ Subclavian    Full       Yes       Yes                      +----------+------------+---------+-----------+----------+-------+  Left Findings: +----------+------------+---------+-----------+----------+-------+ LEFT      CompressiblePhasicitySpontaneousPropertiesSummary +----------+------------+---------+-----------+----------+-------+ IJV           Full       Yes       Yes                      +----------+------------+---------+-----------+----------+-------+  Subclavian               Yes       Yes                      +----------+------------+---------+-----------+----------+-------+ Axillary      Full       Yes       Yes                      +----------+------------+---------+-----------+----------+-------+ Brachial      Full                                          +----------+------------+---------+-----------+----------+-------+  Radial        Full                                          +----------+------------+---------+-----------+----------+-------+ Ulnar         Full                                          +----------+------------+---------+-----------+----------+-------+ Cephalic      None                                   Acute  +----------+------------+---------+-----------+----------+-------+ Basilic       Full                                          +----------+------------+---------+-----------+----------+-------+ Thrombus located in the cephalic vein is noted to extend from the mid upper arm into the distal forearm.  Summary:  Right: No evidence of thrombosis in the subclavian.  Left: No evidence of deep vein thrombosis in the upper extremity. Findings consistent with acute superficial vein thrombosis involving the left cephalic vein.  *See table(s) above for measurements and observations.  Diagnosing physician: Runell Countryman Electronically signed by Runell Countryman on 09/03/2023 at 6:18:23 PM.    Final    CT ABDOMEN PELVIS WO CONTRAST Result Date: 09/03/2023 CLINICAL DATA:  Abdominal pain, acute, nonlocalized. Leaking colostomy bag. Left-sided pain from the ribcage to the hip. Prior colon surgery 2022. Lysis of adhesions 08/20/2023 with ventral hernia repair. EXAM: CT ABDOMEN AND PELVIS WITHOUT CONTRAST TECHNIQUE: Multidetector CT imaging of the abdomen and pelvis was performed following the standard protocol without IV contrast. RADIATION DOSE REDUCTION: This exam was performed according to the  departmental dose-optimization program which includes automated exposure control, adjustment of the mA and/or kV according to patient size and/or use of iterative reconstruction technique. COMPARISON:  The preoperative CTs without contrast 08/12/2023 and 08/06/2023. FINDINGS: Lower chest: Mild cardiomegaly. Healed median sternotomy. No pericardial effusion. There is a partially visible ascending aortic sleeve repair, median sternotomy sutures, partially visible thoracic aortic stent graft repair, maximum descending aortic caliber 4.6 cm. Chronic elevation right hemidiaphragm. There is increased posterior atelectasis in the lower lobes, increased subsegmental atelectasis in the posterior right middle lobe. There are increased ground-glass opacities in the right middle and lower lobes which could be ground-glass edema or pneumonitis. Hepatobiliary: Limited detail in the liver owing to streak artifacts from the patient's overlying arms. No obvious mass. There are tiny layering stones in the gallbladder without bile duct dilatation or wall thickening. Pancreas: Partially atrophic. Otherwise unremarkable without contrast. Spleen: Unremarkable without contrast. Adrenals/Urinary Tract: No adrenal mass. No contour deforming abnormality of either of the unenhanced kidneys. Chronic perinephric stranding is similar. No urinary stone or obstruction. There is no bladder thickening. There is trace air in the anterior bladder which could be due to recent catheterization or infection. This not seen on the earlier 2 studies from this month. Stomach/Bowel: As seen previously there has been a remote right hemicolectomy up to the distal transverse colon. The unused portion of the colon is contracted and empty. The gastric wall is contracted.  Majority of the small bowel is normal in caliber with slightly dilated segments in the anterior left mid abdomen up to 2.7 cm caliber, favor ileus but could be due to a low-grade obstruction. The  small bowel was diffusely much more dilated previously. There is a right mid abdominal loop ileostomy. There previously was a sizable parastomal hernia which has been reduced, with herniated bowel segments no longer seen. Vascular/Lymphatic: There is moderate to heavy aortoiliac calcific plaque. 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 No lymphadenopathy is seen.  Juxtarenal AAA again measures 4 cm. Reproductive: Uterus and bilateral adnexa are unremarkable. Other: Loculated fluid collection just deep to the anterolateral right mid abdominal wall measures 4.4 x 2.8 cm on 2:50, 20 Hounsfield units and could be a loculated seroma or hematoma, with infection not excluded. There is mild patchy fluid in the mesenteric folds, and scattered nonlocalizing fluid in a circumscribed area in the anterior right lower abdominopelvic quadrant measuring 16.3 x 6.3 cm on 2:68, displacing bowel around it and containing fat and scattered fluid. I believe this is probably fat which was previously herniated and has been placed back into the abdominal cavity. This may be undergoing a degree of fat necrosis. There previously was a large complex right paraumbilical hernia just above this level which has been repaired. There are overlying midline skin staples. There is an ovoid subcutaneous fluid collection anteriorly along side the umbilicus in the right mid abdomen measuring 10.4 x 2.5 cm and 18 Hounsfield units on 2:59 which is probably a seroma. There is a hematoma underlying the incision subcutaneously measuring 4.7 x 2.7 cm on 2:63, and another subcutaneous hematoma in the anterior right mid abdomen below this level measuring 3.9 x 3.0 cm on 2:77, as well as body wall anasarca in the lateral abdominal walls and flanks and in the upper thighs. There is no free hemorrhage seen, no evidence of free air. Multiple pelvic phleboliths. Musculoskeletal: There are degenerative changes in the lumbar spine greatest at L5-S1, ankylosis over  the anterior right SI joint. No acute or other significant osseous findings. IMPRESSION: 1. 4.4 x 2.8 cm loculated fluid collection just deep to the anterolateral right mid abdominal wall, could be a loculated seroma or hematoma, with infection not excluded. 2. 16.3 x 6.3 cm circumscribed area in the anterior right lower abdominopelvic quadrant containing fat and scattered fluid, probably fat which was previously herniated and has been placed back into the abdominal cavity. This may be undergoing a degree of fat necrosis. 3. 10.4 x 2.5 cm subcutaneous fluid collection anteriorly along side the umbilicus in the right mid abdomen, probably a seroma. 4. 4.7 x 2.7 cm hematoma underlying the incision subcutaneously, and another 3.9 x 3.0 cm hematoma in the anterior right mid abdomen below this level. 5. Parastomal and right paraumbilical hernia repairs as detailed above. 6. Body wall anasarca. 7. Slightly dilated small bowel segments in the anterior left mid abdomen up to 2.7 cm caliber, favor ileus but could be due to a low-grade obstruction. The small bowel was diffusely much more dilated previously. 8. Cholelithiasis. 9. Increased ground-glass opacities in the right middle and lower lobes which could be ground-glass edema or pneumonitis. 10. Cardiomegaly with partially visible ascending aortic sleeve repair, median sternotomy sutures, and partially visible thoracic aortic stent graft repair, maximum descending aortic caliber 4.6 cm. 11. Aortic atherosclerosis and stable AAA. Recommend CTA or MRA, as appropriate, in 6 months and referral to a vascular specialist if not already done. Reference: Journal  of Vascular Surgery 67.1 (2018): 2-77. J Am Coll Radiol [id number]. Aortic Atherosclerosis (ICD10-I70.0). Electronically Signed   By: Denman Fischer M.D.   On: 09/03/2023 03:01   DG Chest Portable 1 View Result Date: 09/03/2023 CLINICAL DATA:  Shortness of breath. EXAM: PORTABLE CHEST 1 VIEW COMPARISON:  Aug 20, 2023 FINDINGS: Multiple sternal wires are present. The heart size and mediastinal contours are within normal limits. There is extensive stenting of the thoracic aorta. Low lung volumes are noted. Mild atelectasis and/or infiltrate is seen within the bilateral lung bases and mid right lung. No pleural effusion or pneumothorax is identified. Multilevel degenerative changes seen throughout the thoracic spine. IMPRESSION: Low lung volumes with mild bibasilar and mid right lung atelectasis and/or infiltrate. Electronically Signed   By: Virgle Grime M.D.   On: 09/03/2023 02:31   DG Chest Port 1 View Result Date: 08/20/2023 CLINICAL DATA:  NG placement. EXAM: PORTABLE CHEST 1 VIEW COMPARISON:  Chest radiograph dated 08/12/2023. FINDINGS: Enteric tube extends below the diaphragm into the stomach and fold back on itself with tip in the region of the proximal stomach. There is shallow inspiration with bibasilar atelectasis. No pneumothorax. Stable cardiac silhouette. Median sternotomy wires and repair of the thoracic aorta. No acute osseous pathology. IMPRESSION: 1. Enteric tube with tip in the proximal stomach. 2. Shallow inspiration with bibasilar atelectasis. Electronically Signed   By: Angus Bark M.D.   On: 08/20/2023 15:36    Microbiology: Recent Results (from the past 240 hours)  Gastrointestinal Panel by PCR , Stool     Status: None   Collection Time: 09/07/23  2:54 PM   Specimen: Stool  Result Value Ref Range Status   Campylobacter species NOT DETECTED NOT DETECTED Final   Plesimonas shigelloides NOT DETECTED NOT DETECTED Final   Salmonella species NOT DETECTED NOT DETECTED Final   Yersinia enterocolitica NOT DETECTED NOT DETECTED Final   Vibrio species NOT DETECTED NOT DETECTED Final   Vibrio cholerae NOT DETECTED NOT DETECTED Final   Enteroaggregative E coli (EAEC) NOT DETECTED NOT DETECTED Final   Enteropathogenic E coli (EPEC) NOT DETECTED NOT DETECTED Final   Enterotoxigenic E coli  (ETEC) NOT DETECTED NOT DETECTED Final   Shiga like toxin producing E coli (STEC) NOT DETECTED NOT DETECTED Final   Shigella/Enteroinvasive E coli (EIEC) NOT DETECTED NOT DETECTED Final   Cryptosporidium NOT DETECTED NOT DETECTED Final   Cyclospora cayetanensis NOT DETECTED NOT DETECTED Final   Entamoeba histolytica NOT DETECTED NOT DETECTED Final   Giardia lamblia NOT DETECTED NOT DETECTED Final   Adenovirus F40/41 NOT DETECTED NOT DETECTED Final   Astrovirus NOT DETECTED NOT DETECTED Final   Norovirus GI/GII NOT DETECTED NOT DETECTED Final   Rotavirus A NOT DETECTED NOT DETECTED Final   Sapovirus (I, II, IV, and V) NOT DETECTED NOT DETECTED Final    Comment: Performed at [facility], [address] [place], [place] [address]  Aerobic/Anaerobic Culture w Gram Stain (surgical/deep wound)     Status: None   Collection Time: 09/07/23  3:48 PM   Specimen: Abscess  Result Value Ref Range Status   Specimen Description ABSCESS  Final   Special Requests ABD  Final   Gram Stain NO WBC SEEN NO ORGANISMS SEEN   Final   Culture   Final    No growth aerobically or anaerobically. Performed at [facility], [address], [place], [place] [address]    Report Status 09/12/2023 FINAL  Final  Labs: Basic Metabolic Panel: Recent Labs  Lab 09/11/23 0340 09/12/23 0355 09/13/23 0343 09/14/23 0345 09/16/23 1321  NA 140 138 139 138 139  K 3.6 3.7 4.0 3.9 4.1  CL 112* 108 109 108 108  CO2 19* 22 21* 22 20*  GLUCOSE 111* 98 90 98 142*  BUN 27* 23 23 26* 21  CREATININE 2.61* 2.36* 2.39* 2.39* 2.45*  CALCIUM  8.7* 8.7* 8.6* 8.6* 9.3   Liver Function Tests: No results for input(s): AST, ALT, ALKPHOS, BILITOT, PROT, ALBUMIN  in the last 168 hours. No results for input(s): LIPASE, AMYLASE in the last 168 hours. No results for input(s): AMMONIA in the last 168 hours. CBC: Recent Labs  Lab 09/16/23 1321  WBC 8.3  HGB 9.2*  HCT 30.5*  MCV 102.0*  PLT  345   Cardiac Enzymes: No results for input(s): CKTOTAL, CKMB, CKMBINDEX, TROPONINI in the last 168 hours. BNP: BNP (last 3 results) Recent Labs    09/03/23 0149 09/07/23 0347  BNP 2,422.2* 496.9*    ProBNP (last 3 results) No results for input(s): PROBNP in the last 8760 hours.  CBG: No results for input(s): GLUCAP in the last 168 hours.     Signed:  Raymonde Calico MD.  [facility] 09/17/2023, 12:09 PM

## 2023-09-17 NOTE — Plan of Care (Signed)
   Problem: Education: Goal: Knowledge of General Education information will improve Description: Including pain rating scale, medication(s)/side effects and non-pharmacologic comfort measures Outcome: Progressing   Problem: Clinical Measurements: Goal: Diagnostic test results will improve Outcome: Progressing   Problem: Activity: Goal: Risk for activity intolerance will decrease Outcome: Progressing   Problem: Nutrition: Goal: Adequate nutrition will be maintained Outcome: Progressing   Problem: Coping: Goal: Level of anxiety will decrease Outcome: Progressing

## 2023-09-17 NOTE — Plan of Care (Signed)
  Problem: Education: Goal: Knowledge of General Education information will improve Description: Including pain rating scale, medication(s)/side effects and non-pharmacologic comfort measures Outcome: Progressing   Problem: Health Behavior/Discharge Planning: Goal: Ability to manage health-related needs will improve Outcome: Progressing   Problem: Clinical Measurements: Goal: Ability to maintain clinical measurements within normal limits will improve Outcome: Progressing Goal: Will remain free from infection Outcome: Progressing Goal: Diagnostic test results will improve Outcome: Progressing Goal: Respiratory complications will improve Outcome: Progressing Goal: Cardiovascular complication will be avoided Outcome: Progressing   Problem: Activity: Goal: Risk for activity intolerance will decrease Outcome: Progressing   Problem: Nutrition: Goal: Adequate nutrition will be maintained Outcome: Progressing   Problem: Coping: Goal: Level of anxiety will decrease Outcome: Progressing   Problem: Elimination: Goal: Will not experience complications related to bowel motility Outcome: Progressing Goal: Will not experience complications related to urinary retention Outcome: Progressing   Problem: Pain Managment: Goal: General experience of comfort will improve and/or be controlled Outcome: Progressing   Problem: Safety: Goal: Ability to remain free from injury will improve Outcome: Progressing   Problem: Skin Integrity: Goal: Risk for impaired skin integrity will decrease Outcome: Progressing   Problem: Clinical Measurements: Goal: Ability to avoid or minimize complications of infection will improve Outcome: Progressing   Problem: Skin Integrity: Goal: Skin integrity will improve Outcome: Progressing   Problem: Education: Goal: Ability to demonstrate management of disease process will improve Outcome: Progressing Goal: Ability to verbalize understanding of medication  therapies will improve Outcome: Progressing Goal: Individualized Educational Video(s) Outcome: Progressing   Problem: Activity: Goal: Capacity to carry out activities will improve Outcome: Progressing   Problem: Cardiac: Goal: Ability to achieve and maintain adequate cardiopulmonary perfusion will improve Outcome: Progressing

## 2023-09-18 DIAGNOSIS — R6250 Unspecified lack of expected normal physiological development in childhood: Secondary | ICD-10-CM | POA: Insufficient documentation

## 2023-10-01 ENCOUNTER — Other Ambulatory Visit (HOSPITAL_BASED_OUTPATIENT_CLINIC_OR_DEPARTMENT_OTHER): Payer: Self-pay | Admitting: Family

## 2023-10-31 DIAGNOSIS — D696 Thrombocytopenia, unspecified: Secondary | ICD-10-CM | POA: Insufficient documentation

## 2023-10-31 DIAGNOSIS — Z9889 Other specified postprocedural states: Secondary | ICD-10-CM | POA: Insufficient documentation

## 2023-11-11 DIAGNOSIS — D520 Dietary folate deficiency anemia: Secondary | ICD-10-CM | POA: Insufficient documentation

## 2023-11-11 DIAGNOSIS — Z992 Dependence on renal dialysis: Secondary | ICD-10-CM | POA: Insufficient documentation

## 2023-11-12 DIAGNOSIS — R2681 Unsteadiness on feet: Secondary | ICD-10-CM | POA: Insufficient documentation

## 2023-11-15 IMAGING — CT CT ANGIO CHEST-ABD-PELV FOR DISSECTION W/ AND WO/W CM
2 of 6 series · 9 of 46 positions shown, 10 images · IV contrast (Omnipaque or Isovue)
Comparison: Noncontrast chest CT-earlier same day

Chest CT-10/07/2017

CLINICAL DATA: Patient with history of type B descending thoracic
aortic aneurysm, now with concern for intramural hematoma on
noncontrast chest CT performed earlier today.

EXAM:
CT ANGIOGRAPHY CHEST, ABDOMEN AND PELVIS
TECHNIQUE: Non-contrast CT of the chest was initially obtained.

[Series 4: axial arterial · axial · arterial · 0.98mm/px · z∈[+1114,+1627]mm · 6 of 221 slices shown, 7 images]
[im 25/221  soft-tissue]
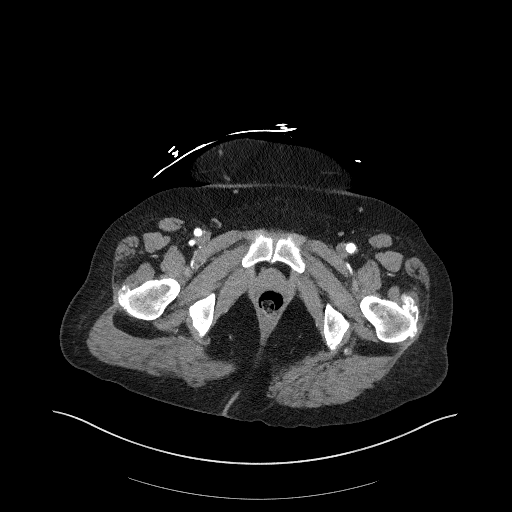
[im 25/221  bone]
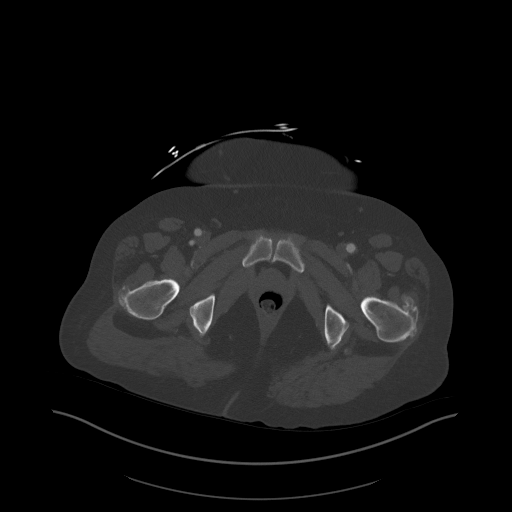
[im 62/221  soft-tissue]
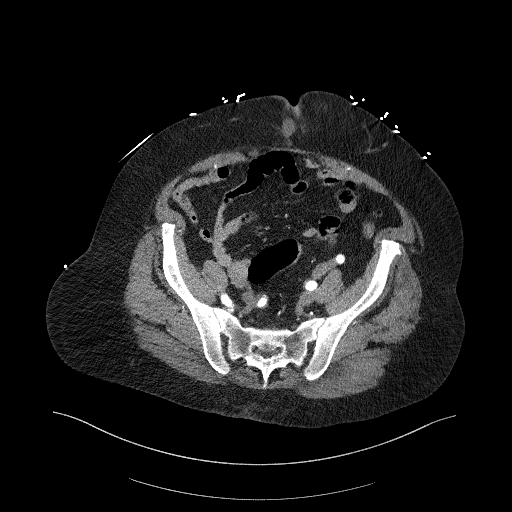
[im 98/221  soft-tissue]
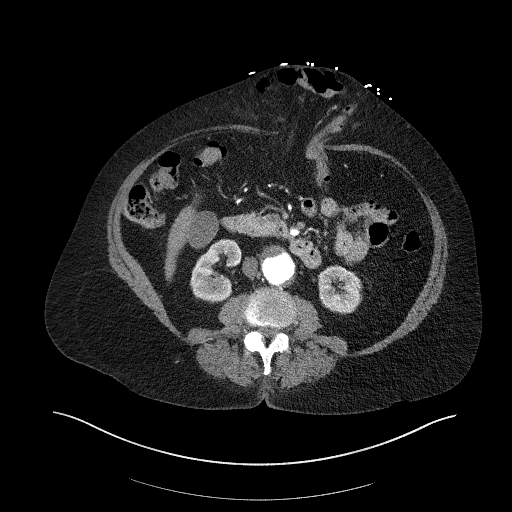
[im 123/221  soft-tissue]
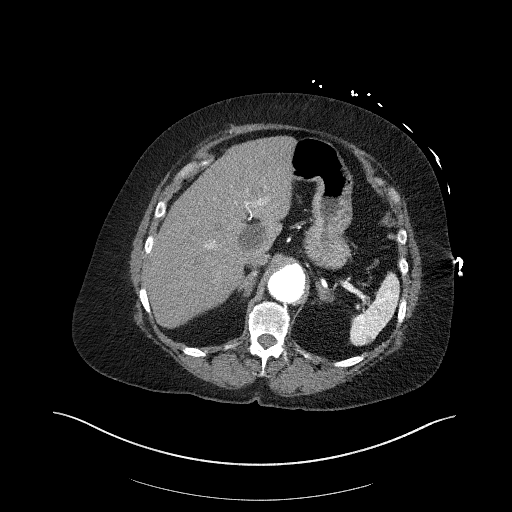
[im 159/221  soft-tissue]
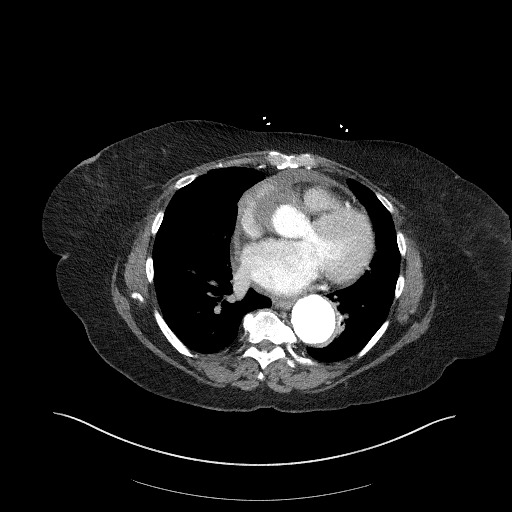
[im 196/221  soft-tissue]
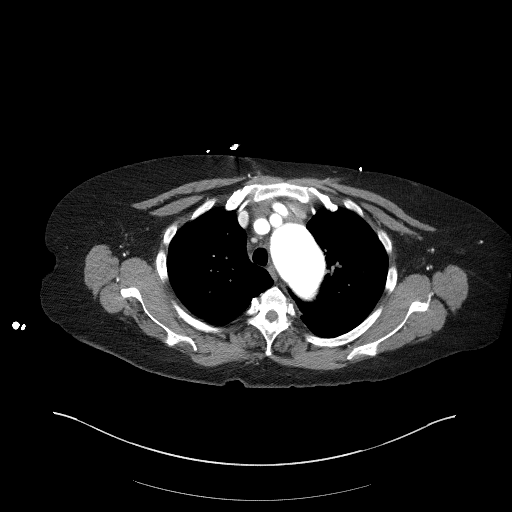

[Series 8: cor soft · coronal · 0.89mm/px · 3 of 190 slices shown]
[im 48/190  soft-tissue]
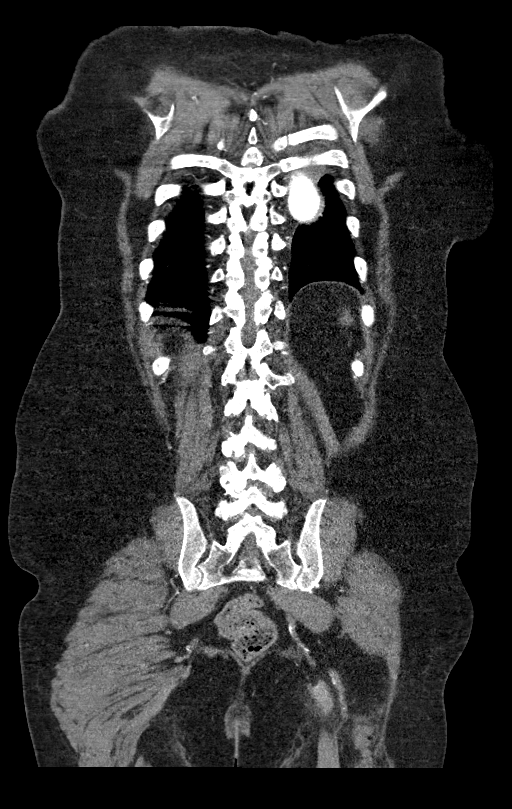
[im 95/190  soft-tissue]
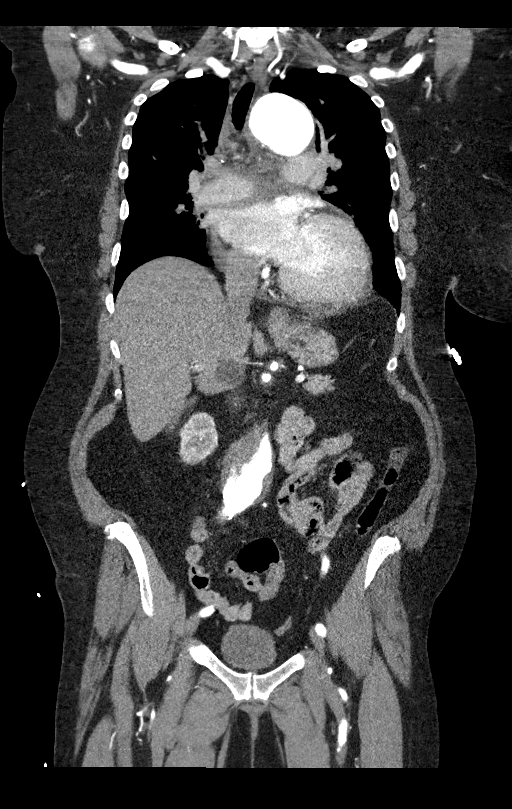
[im 142/190  soft-tissue]
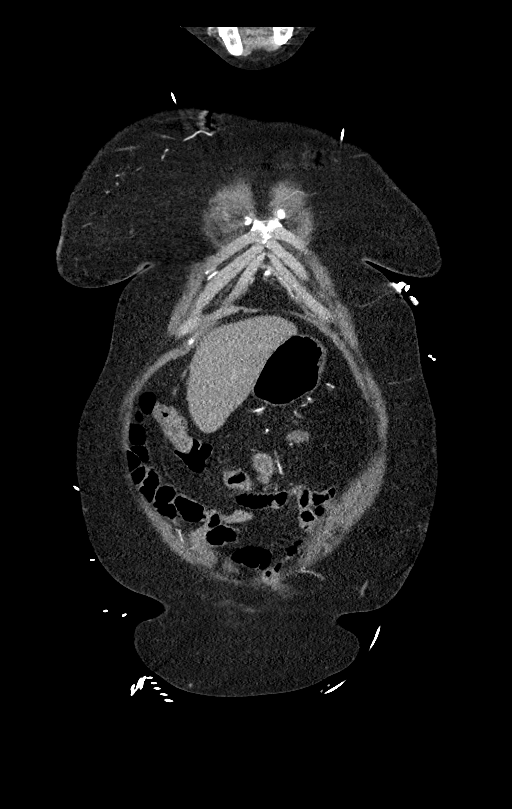

[9 of 46 positions shown; findings below may reference images not displayed]

Multidetector CT imaging through the chest, abdomen and pelvis was
performed using the standard protocol during bolus administration of
intravenous contrast. Multiplanar reconstructed images and MIPs were
obtained and reviewed to evaluate the vascular anatomy.

CONTRAST:  80mL OMNIPAQUE IOHEXOL 350 MG/ML SOLN
FINDINGS: CTA CHEST FINDINGS

Vascular Findings:

Interval development of a short-segment type A thoracic aortic
dissection beginning at the aortic root and extending to the
posterior aspect of the aortic arch, peripheral to the takeoff of
the left subclavian artery. The branch vessels of the aortic arch
appear to arise from the true lumen and are not affected by the
short-segment dissection. Conventional configuration of the aortic
arch.

High-density material is seen about the aortic root and within the
pericardium sac compatible with hemorrhage though a discrete area of
active extravasation is not identified.

Similar findings of known short-segment type B thoracic aortic
dissection, though now with nonocclusive mural thrombus within the
false lumen involving the proximal descending thoracic aorta (image
41, series 4).

The thoracic aorta is aneurysmal with measurements as follows.

Normal heart size.

Although this examination was not tailored for the evaluation the
pulmonary arteries, there are no discrete filling defects within the
central pulmonary arterial tree to suggest central pulmonary
embolism. Normal caliber of the main pulmonary artery.

-------------------------------------------------------------

Thoracic aortic measurements:

SINOTUBULAR JUNCTION: 35 mm as measured in greatest oblique short
axis coronal dimension.

PROXIMAL ASCENDING THORACIC AORTA: 40 mm as measured in greatest
oblique short axis axial dimension at the level of the main
pulmonary artery (axial image 49, series 4) and approximately 43 mm
in greatest oblique short axis coronal diameter (coronal image 78,
series 8)

AORTIC ARCH: 47 mm as measured in greatest oblique short axis
sagittal dimension (sagittal image 130, series 9).

PROXIMAL DESCENDING THORACIC AORTA: 35 mm as measured in greatest
oblique short axis axial dimension at the level of the main
pulmonary artery.

DISTAL DESCENDING THORACIC AORTA: 44 mm as measured in greatest
oblique short axis axial dimension at the level of the diaphragmatic
hiatus (axial image 91, series 4).

Review of the MIP images confirms the above findings.

-------------------------------------------------------------

Non-Vascular Findings:

Mediastinum/Lymph Nodes: No bulky mediastinal, hilar or axillary
lymphadenopathy. As above, high-density material is seen about the
aortic root and pericardium worrisome for hemorrhage though discrete
area active extravasation is not identified.

Lungs/Pleura: Subsegmental atelectasis adjacent to the aneurysmal
descending thoracic aorta. Minimal dependent subpleural ground-glass
atelectasis. No discrete focal airspace opacities. No pleural
effusion or pneumothorax. The central pulmonary airways appear
widely patent. No discrete pulmonary nodules.

Musculoskeletal: No acute or aggressive osseous abnormalities.
Regional soft tissues appear normal. The thyroid gland is atrophic
but otherwise normal in appearance.

_________________________________________________________

_________________________________________________________

CTA ABDOMEN AND PELVIS FINDINGS

VASCULAR

Aorta: Interval increase in size of infrarenal abdominal aortic
aneurysm now measuring 4.6 cm in maximal diameter (image 134, series
4, previously, 3.8 cm (when compared to the [DATE] examination. The
aneurysm is estimated to arise approximately 1.5 cm caudal to the
take-off of the most inferior left renal artery and extend to
approximately 1.3 cm above the aortic bifurcation. There is a large
amount of crescentic mural thrombus within the dominant component of
the aneurysm, not definitely resulting in hemodynamically
significant stenosis. No evidence of abdominal aortic dissection or
perivascular stranding.

Celiac: There is a minimal amount of mixed calcified and
noncalcified atherosclerotic plaque involving the origin the celiac
artery, not resulting in a hemodynamically significant stenosis.
Conventional branching pattern.

SMA: There is a moderate amount of eccentric mixed calcified and
noncalcified atherosclerotic plaque involving the origin and
proximal aspects of the SMA, not resulting in a hemodynamically
significant stenosis. The distal tributaries of the SMA appear
widely patent without discrete lumen filling defect to suggest
distal embolism.

Renals: Solitary bilaterally. There is a minimal amount of calcified
atherosclerotic plaque involving the origin of the bilateral renal
arteries, not resulting in hemodynamically significant stenosis. No
vessel irregularity to suggest FMD.

IMA: Remains patent though its origin is surrounded by crescentic
mural thrombus at the level of the abdominal aortic aneurysm.

Inflow: There is a moderate amount of mixed calcified and
noncalcified atherosclerotic plaque involving the bilateral common
iliac arteries, not resulting in hemodynamically significant
stenosis. The bilateral internal iliac arteries are mildly disease
though patent and of normal caliber. The bilateral external iliac
arteries are markedly tortuous though of normal caliber and widely
patent without hemodynamically significant stenosis.

The bilateral common and imaged portions of the bilateral deep and
superficial femoral arteries are without a definitive
hemodynamically significant narrowing.

Veins: The IVC and pelvic venous systems appear patent on this
arterial phase examination.

Review of the MIP images confirms the above findings.

_________________________________________________________

NON-VASCULAR

Hepatobiliary: Normal hepatic contour. Note is made of an
approximately 5.0 cm hypoattenuating cyst within the caudate
(represent image 104, series 4). No discrete hyperenhancing hepatic
lesions. Normal appearance of the gallbladder given degree
distention. No radiopaque gallstones. No intra or extrahepatic
biliary ductal dilatation. No ascites.

Pancreas: Normal appearance of the pancreas.

Spleen: Normal appearance of the spleen.

Adrenals/Urinary Tract: There is symmetric enhancement of the
bilateral kidneys. No evidence of nephrolithiasis on this
postcontrast examination. No urine obstruction or perinephric
stranding. There is mild thickening the bilateral adrenal glands,
left greater than right, without discrete nodule. Diffuse thickening
of the urinary bladder wall, potentially accentuated due to
underdistention.

Stomach/Bowel: There are two portions of the transverse colon
contained within a wide lack midline ventral wall abdominal hernia,
not resulting in enteric obstruction though small amount of fluid is
seen within the caudal aspect of the hernia. Moderate colonic stool
burden without evidence of enteric obstruction. Normal appearance of
the terminal ileum and the retrocecal appendix. No significant
hiatal hernia. No pneumoperitoneum, pneumatosis or portal venous
gas.

Lymphatic: No bulky retroperitoneal, mesenteric, pelvic or inguinal
lymphadenopathy.

Reproductive: Normal appearance of the pelvic organs for age. No
discrete adnexal lesion. No free fluid the pelvic cul-de-sac.

Other: Wide neck midline ventral wall hernia contains two discrete
portions of the transverse colon. The hernia is estimated to measure
at least 15.4 x 11.9 x 7.4 cm (sagittal image 132, series 9; axial
image 125, series 4), while the neck of the hernia is estimated to
measure at least 5.9 x 5.6 cm (axial image 126, series 4; sagittal
image 130, series 9).

Musculoskeletal: No acute or aggressive osseous abnormalities.
Moderate DDD of L5-S1 with disc space height loss, endplate
irregularity and sclerosis.

Review of the MIP images confirms the above findings.
IMPRESSION: Chest CTA impression:

1. The examination is positive for an acute short-segment ascending
thoracic aortic dissection with flow seen within both the true and
false lumens. High-density fluid is seen about the aortic root and
pericardial sac compatible with hemorrhage though a discrete area of
active extravasation is not identified. The type A dissection
terminates at the level of the posterior aspect of the aortic arch
without extension or involvement of the branch vessels of the aortic
arch, all of which appear to arise from the true lumen.
2. Redemonstrated thoracic aortic aneurysm and chronic type B
thoracic aortic dissection.

Abdomen and pelvic CTA impression:

1. No acute findings within the abdomen or pelvis.
2. Interval increase in size of infrarenal abdominal aortic
aneurysm, now measuring 4.6 cm in maximum diameter, previously,
cm (when compared to the [DATE] examination). There is a large
amount of crescentic mural thrombus within the dominant component of
the aneurysm, not resulting in a hemodynamically significant
stenosis. No evidence of abdominal aortic dissection or periaortic
stranding. Aortic aneurysm NOS ([id number]).
3. Wide neck midline ventral wall hernia contains two separate
portions of the transverse colon, not resulting in enteric
obstruction.

Critical Value/emergent results were called by telephone at the time
of interpretation on 03/24/2021 at [DATE] to provider [facility]
[employer] , who verbally acknowledged these results.

## 2023-11-16 IMAGING — DX DG CHEST 1V PORT
1 series · 1 of 1 positions shown · non-contrast
Comparison: 03/24/2021

CLINICAL DATA: Status post ascending thoracic aortic aneurysm
repair

EXAM:
PORTABLE CHEST 1 VIEW

[chest ap]
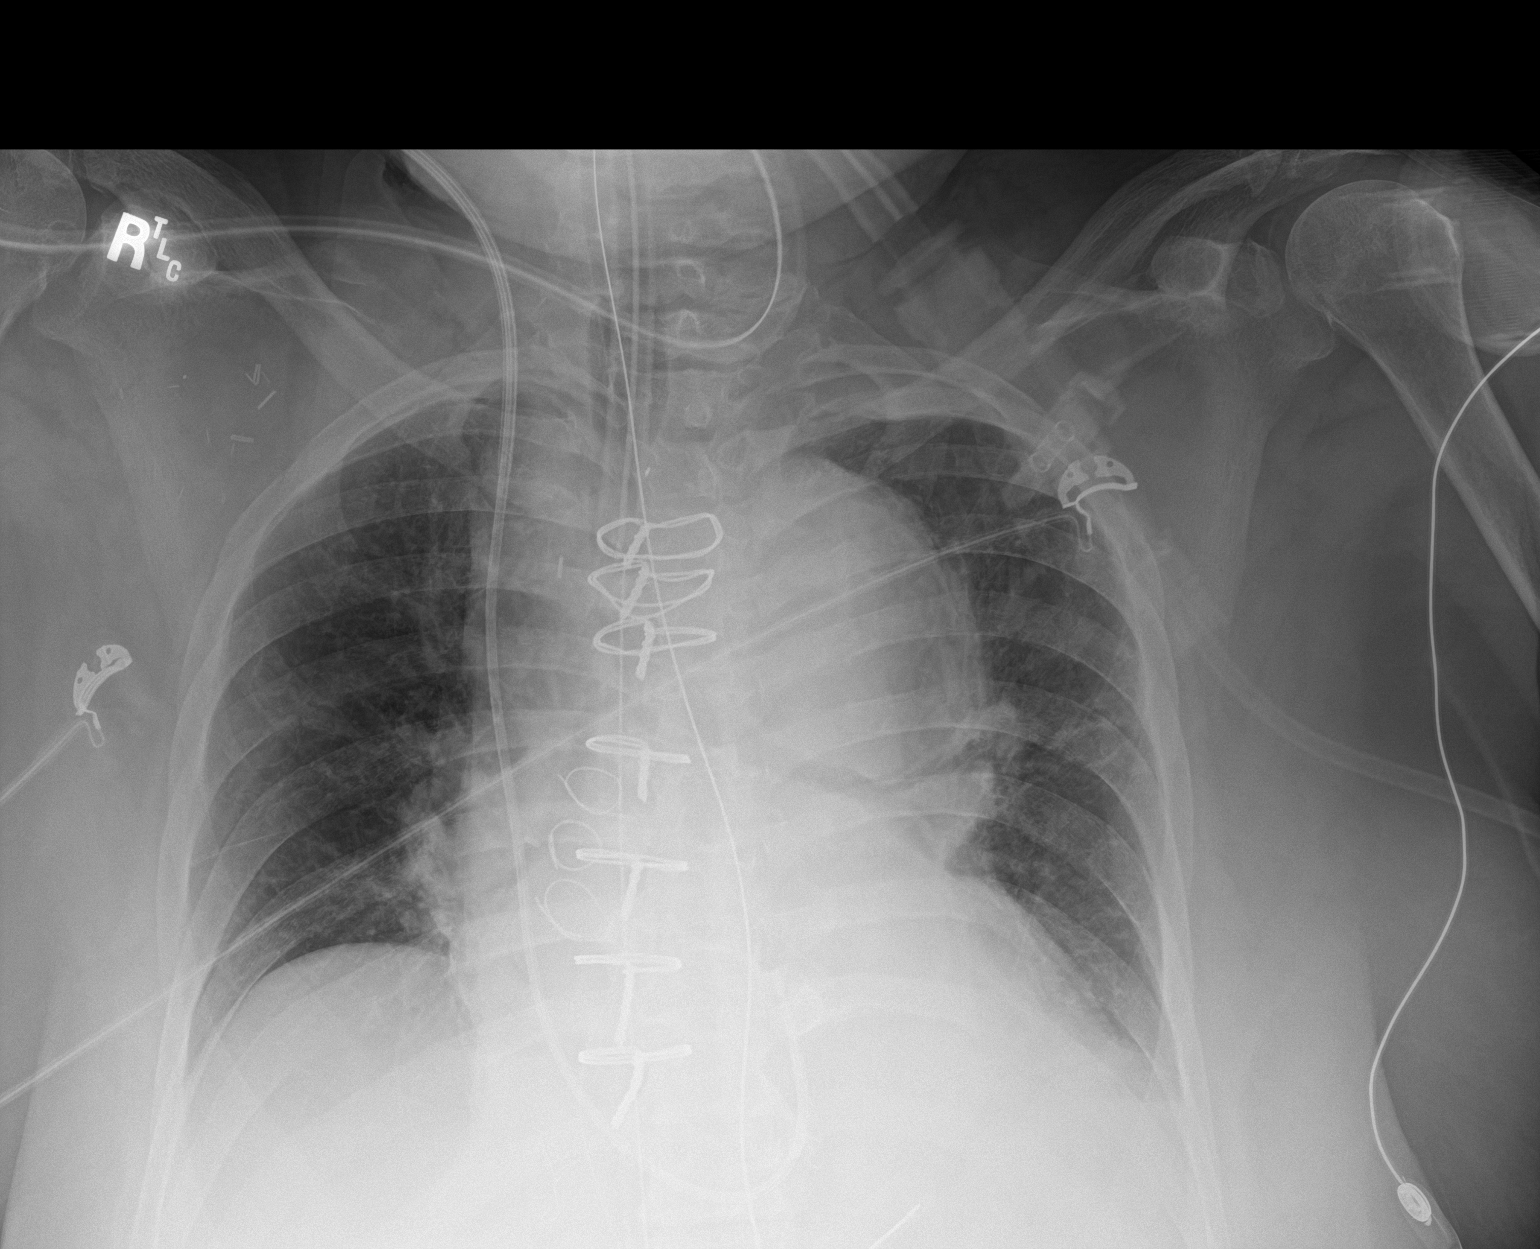

[1 of 1 positions shown; findings below may reference images not displayed]

FINDINGS: 0454 hours. A tracheal tube tip is approximately 4.3 cm above the
base of the carina. Right IJ pulmonary catheter is in the pulmonary
outflow tract. The NG tube passes into the stomach although the
distal tip position is not included on the film. Midline
mediastinal/pericardial drain evident. The cardio pericardial
silhouette is enlarged. With prominence of the transverse aorta. Low
lung volumes with some left basilar atelectasis and tiny left
pleural effusion. Tiny left apical cap may be related to loculated
pleural or extrapleural fluid. Telemetry leads overlie the chest.
IMPRESSION: 1. Support apparatus as described.
2. Low lung volumes with left basilar atelectasis and tiny left
pleural effusion.

## 2023-11-17 IMAGING — DX DG CHEST 1V PORT
1 series · 1 of 1 positions shown · non-contrast
Comparison: Chest radiograph 1 day prior

CLINICAL DATA: Ascending aortic replacement, intubated, chest tube

EXAM:
PORTABLE CHEST 1 VIEW

[chest ap]
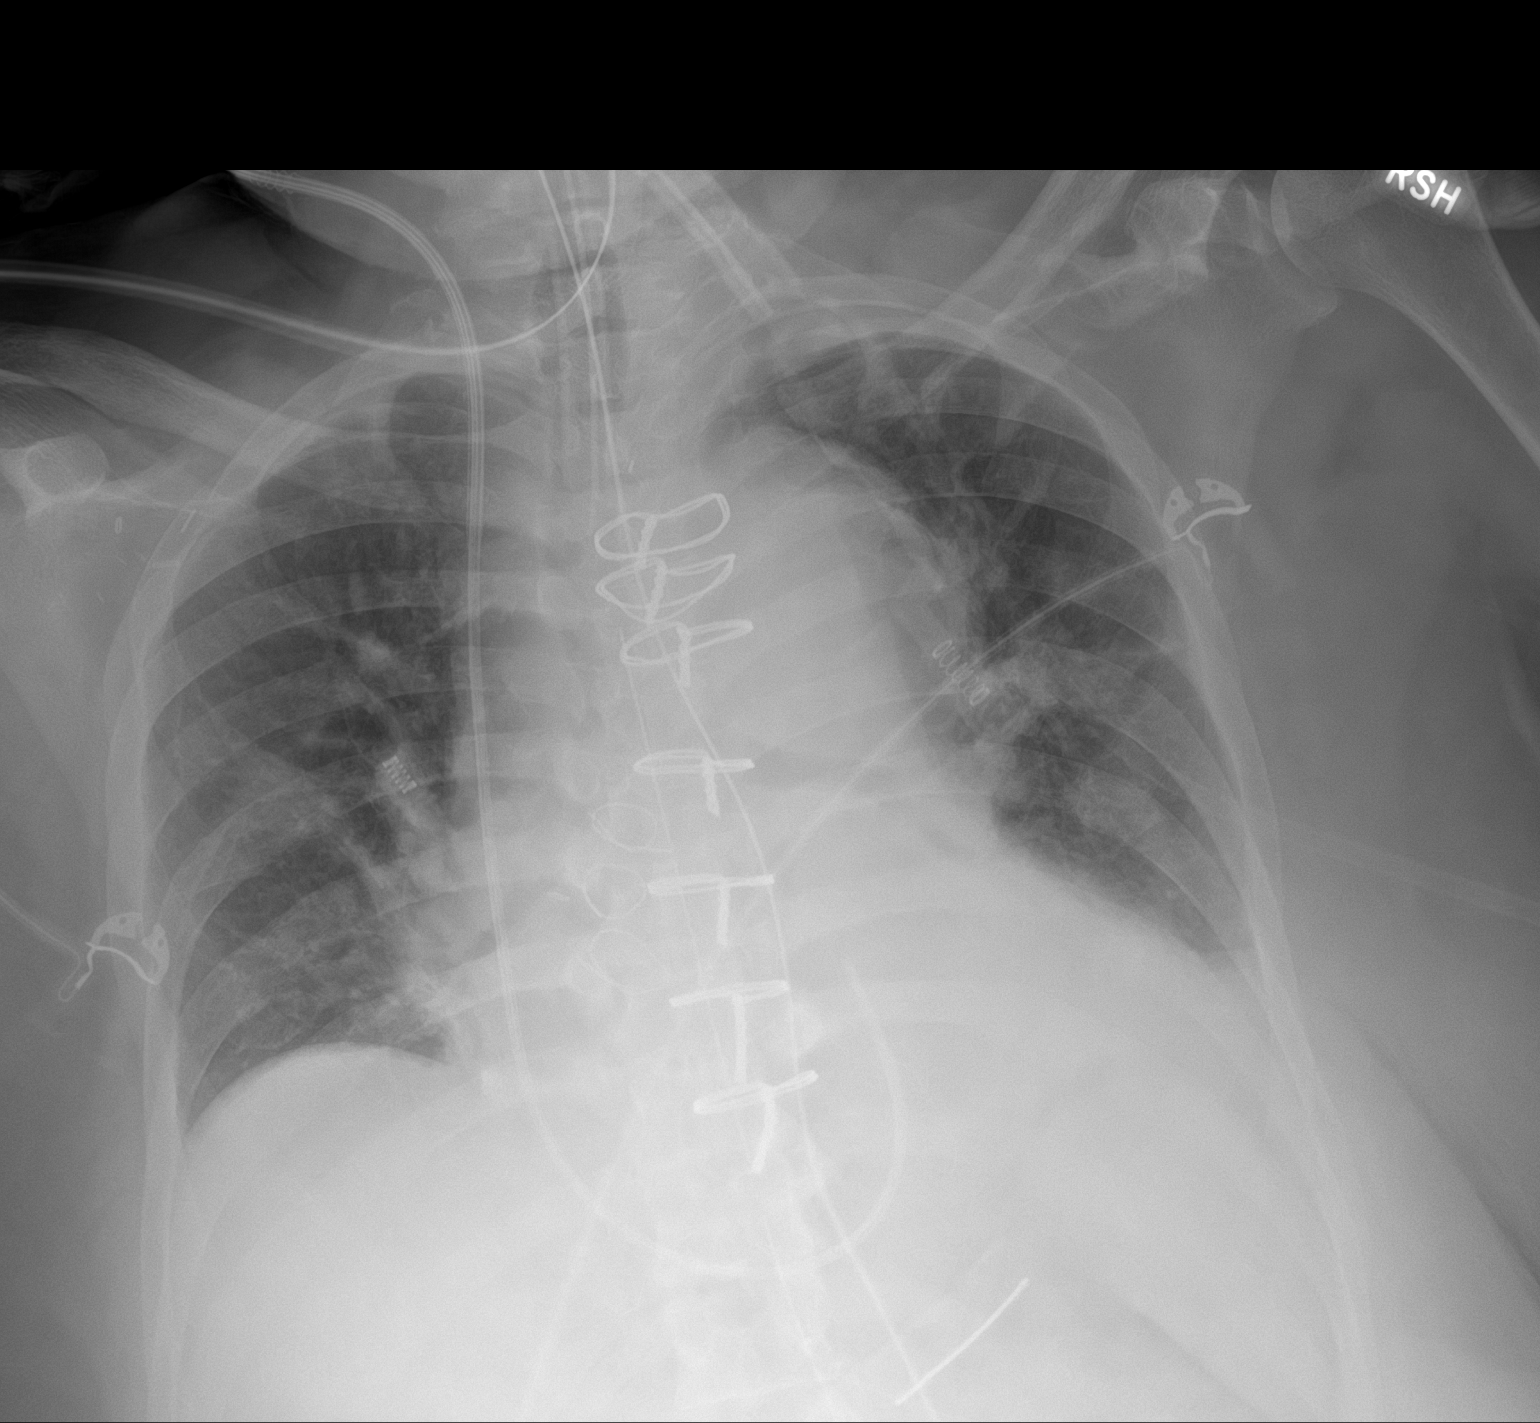

[1 of 1 positions shown; findings below may reference images not displayed]

FINDINGS: Endotracheal tube tip is approximately 5.1 cm from the carina. The
right IJ Swan-Ganz catheter is in stable position likely in the
proximal main pulmonary artery. A presumed mediastinal drain is seen
terminating over the midline. A catheter projecting over the left
upper quadrant may reflect the enteric catheter tip.

The cardiomediastinal silhouette is grossly stable.

Dense retrocardiac opacity and a small left pleural effusion are not
significantly changed. Aeration of the left upper lung is
maintained. The right lung is clear. There is no significant right
effusion. There is no appreciable pneumothorax.

The bones are stable.
IMPRESSION: 1. Support devices as above.
2. Unchanged dense retrocardiac opacity and small left pleural
effusion. Overall, no significant interval change in lung aeration.

## 2023-11-18 IMAGING — DX DG CHEST 1V PORT
1 series · 1 of 1 positions shown · non-contrast
Comparison: Portable exam 5057 hours compared to 03/26/2021

CLINICAL DATA: Post ascending aortic replacement, chest soreness,
chest tube

EXAM:
PORTABLE CHEST 1 VIEW

[chest]
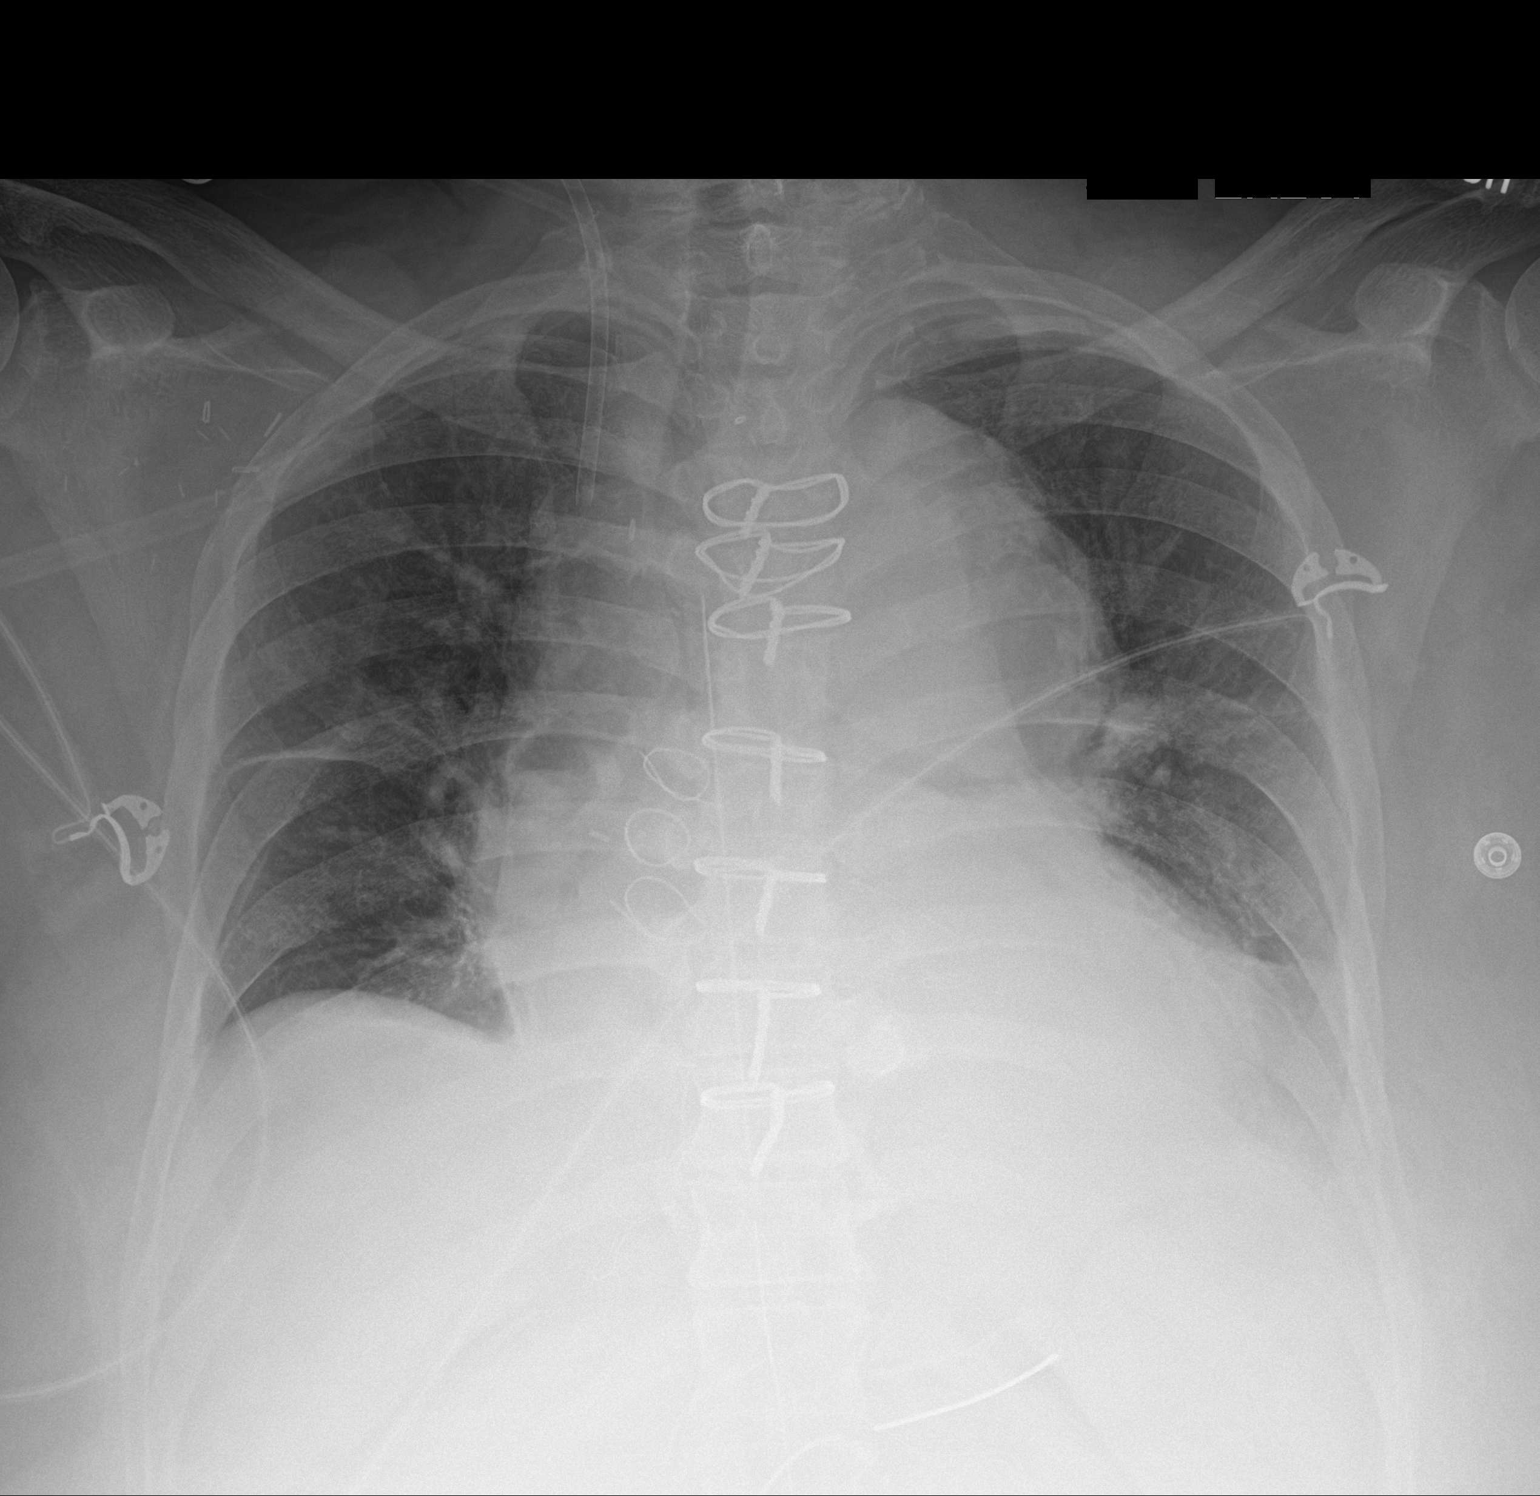

[1 of 1 positions shown; findings below may reference images not displayed]

FINDINGS: Interval removal of endotracheal tube, nasogastric tube, and
Swan-Ganz catheter.

Mediastinal drains and RIGHT jugular catheter persist.

RIGHT jugular catheter demonstrates a kink near the skin surface.

Enlargement of cardiac silhouette post CABG.

Widening of mediastinum likely related to a combination of aortic
enlargement and postsurgical changes similar to previous exam.

Persistent atelectasis LEFT lower lobe.

No gross pleural effusion or pneumothorax.
IMPRESSION: Stable postoperative changes.

## 2023-11-30 ENCOUNTER — Telehealth (HOSPITAL_BASED_OUTPATIENT_CLINIC_OR_DEPARTMENT_OTHER): Payer: Self-pay | Admitting: *Deleted

## 2023-11-30 NOTE — Telephone Encounter (Signed)
 Patient needed Echo follow up after 12/2022 echo. Had one in the hospital 08/2023 Scheduling team asking if patient needs another at this time  Will forward to Dr Raford for review

## 2023-12-02 NOTE — Telephone Encounter (Signed)
 No need for repeat echo at this time.    TCR

## 2023-12-21 ENCOUNTER — Inpatient Hospital Stay: Payer: Self-pay

## 2023-12-24 ENCOUNTER — Other Ambulatory Visit (HOSPITAL_COMMUNITY): Payer: Self-pay

## 2023-12-24 ENCOUNTER — Ambulatory Visit (INDEPENDENT_AMBULATORY_CARE_PROVIDER_SITE_OTHER): Payer: Self-pay

## 2023-12-24 DIAGNOSIS — I10 Essential (primary) hypertension: Secondary | ICD-10-CM

## 2023-12-24 DIAGNOSIS — Z09 Encounter for follow-up examination after completed treatment for conditions other than malignant neoplasm: Secondary | ICD-10-CM

## 2023-12-24 DIAGNOSIS — I2510 Atherosclerotic heart disease of native coronary artery without angina pectoris: Secondary | ICD-10-CM

## 2023-12-24 DIAGNOSIS — G8929 Other chronic pain: Secondary | ICD-10-CM

## 2023-12-24 DIAGNOSIS — Z992 Dependence on renal dialysis: Secondary | ICD-10-CM

## 2023-12-24 DIAGNOSIS — I480 Paroxysmal atrial fibrillation: Secondary | ICD-10-CM

## 2023-12-24 DIAGNOSIS — G8222 Paraplegia, incomplete: Secondary | ICD-10-CM | POA: Diagnosis not present

## 2023-12-24 DIAGNOSIS — I2583 Coronary atherosclerosis due to lipid rich plaque: Secondary | ICD-10-CM

## 2023-12-24 DIAGNOSIS — M545 Low back pain, unspecified: Secondary | ICD-10-CM

## 2023-12-24 DIAGNOSIS — K219 Gastro-esophageal reflux disease without esophagitis: Secondary | ICD-10-CM

## 2023-12-24 DIAGNOSIS — N184 Chronic kidney disease, stage 4 (severe): Secondary | ICD-10-CM

## 2023-12-24 DIAGNOSIS — I953 Hypotension of hemodialysis: Secondary | ICD-10-CM

## 2023-12-24 DIAGNOSIS — N186 End stage renal disease: Secondary | ICD-10-CM

## 2023-12-24 MED ORDER — PANTOPRAZOLE SODIUM 40 MG PO TBEC
40.0000 mg | DELAYED_RELEASE_TABLET | Freq: Every day | ORAL | 11 refills | Status: AC
Start: 2023-12-24 — End: 2024-12-23

## 2023-12-24 MED ORDER — PREDNISONE 10 MG PO TABS: 10.0000 mg | ORAL_TABLET | Freq: Every day | ORAL | 5 refills | Status: AC

## 2023-12-24 MED ORDER — SEVELAMER HCL 800 MG PO TABS: 800.0000 mg | ORAL_TABLET | Freq: Three times a day (TID) | ORAL | 5 refills | Status: AC

## 2023-12-24 MED ORDER — AMIODARONE HCL 200 MG PO TABS: 200.0000 mg | ORAL_TABLET | Freq: Every day | ORAL | 5 refills | Status: DC

## 2023-12-24 MED ORDER — MIDODRINE HCL 5 MG PO TABS: 5.0000 mg | ORAL_TABLET | Freq: Every day | ORAL | 5 refills | Status: AC

## 2023-12-24 MED ORDER — ATORVASTATIN CALCIUM 80 MG PO TABS: 80.0000 mg | ORAL_TABLET | Freq: Every day | ORAL | 11 refills | Status: AC

## 2023-12-24 MED ORDER — LIDOCAINE 5 % EX PTCH: 1.0000 | MEDICATED_PATCH | CUTANEOUS | 5 refills | Status: AC

## 2023-12-24 MED ORDER — ONDANSETRON HCL 4 MG PO TABS: 4.0000 mg | ORAL_TABLET | Freq: Three times a day (TID) | ORAL | 5 refills | Status: AC | PRN

## 2023-12-24 MED ORDER — RENA-VITE PO TABS
1.0000 | ORAL_TABLET | Freq: Every day | ORAL | 5 refills | Status: AC
Start: 2023-12-24 — End: ?

## 2023-12-24 MED ORDER — APIXABAN 5 MG PO TABS: 5.0000 mg | ORAL_TABLET | Freq: Two times a day (BID) | ORAL | 5 refills | Status: AC

## 2023-12-24 NOTE — Progress Notes (Signed)
 Dorothy Harris

## 2023-12-24 NOTE — Progress Notes (Signed)
 Established Patient Office Visit  Subjective   Patient ID: Dorothy Harris, female    DOB: [birth year]  Age: 62 y.o. MRN: [id number]  Chief Complaint  Patient presents with   Hospitalization Follow-up    HPI Discussed the use of AI scribe software for clinical note transcription with the patient, who gave verbal consent to proceed.  History of Present Illness   Dorothy Harris is a 63 year old female with end-stage renal disease on dialysis who presents for follow-up after recent hospitalization and rehabilitation. She is accompanied by Dorothy Harris, her caregiver.  End-stage renal disease and dialysis management - End-stage renal disease on hemodialysis three times weekly (Monday, Wednesday, Friday) - Recently discharged from hospital and rehabilitation in September - At home since discharge  Medication management concerns - Concerned about necessity of continuing all prescribed medications daily - Out of one medication for several days - Has leftover blood pressure medication that is three months old - Current medications: amiodarone , Eliquis , Protonix , atorvastatin  - Amlodipine , Norvasc , and metoprolol  were discontinued during recent hospitalization  Chronic back pain and functional limitations - Persistent back pain since hospital discharge - Requires assistance at home for daily activities - Unable to stand for prolonged periods - Uses a cane for mobility - Seeking approval for a home health aide to assist with showering and other activities of daily living  Immunization and infectious disease screening - No desire for influenza vaccination today - Recent tuberculosis test completed      Patient Active Problem List   Diagnosis Date Noted   Gastroesophageal reflux disease 12/27/2023   Chronic kidney disease requiring chronic dialysis (HCC) 12/27/2023   Hemodialysis-associated hypotension 12/27/2023   Acute on chronic heart failure with preserved ejection fraction  (HFpEF) (HCC) 09/03/2023   Ileus (HCC) 09/03/2023   Edema of left upper extremity 09/03/2023   QT prolongation 09/03/2023   Sepsis (HCC) 09/03/2023   Persistent atrial fibrillation (HCC) 08/27/2023   Perforated bowel (HCC) 08/20/2023   Mesenteric abscess (HCC) 08/20/2023   Atrial fibrillation with rapid ventricular response (HCC) 08/17/2023   Small bowel obstruction (HCC) 08/07/2023   Abdominal pain 08/07/2023   Nausea & vomiting 08/07/2023   Dehydration 08/07/2023   Elevated troponin 08/07/2023   Obesity, Class I, BMI 30-34.9 08/07/2023   (HFpEF) heart failure with preserved ejection fraction (HCC) 08/07/2023   Ischemic colitis (HCC) 08/07/2023   Ileostomy in place (HCC) 08/07/2023   Parastomal hernia without obstruction or gangrene 08/07/2023   Incisional hernia with obstruction but no gangrene 08/07/2023   Dyspnea on exertion 11/19/2022   Vitamin D  deficiency 05/29/2022   Abdominal aortic aneurysm (AAA) (HCC) 05/08/2022   Iron deficiency 05/08/2022   Chronic midline low back pain without sciatica 05/08/2022   Anemia of chronic renal failure 03/20/2022   Hospital discharge follow-up 02/20/2022   Anemia 01/29/2022   History of pneumonia 01/29/2022   Hyperglycemia 01/29/2022   Impaired mobility and ADLs 01/29/2022   Incomplete paraplegia (HCC) 01/29/2022   Wound of left groin 01/29/2022   Stage 4 chronic kidney disease (HCC)    Leukocytosis    Leg weakness, bilateral 01/17/2022   Thoracic aortic aneurysm (HCC) 01/15/2022   Annual physical exam 11/05/2021   Depression, major, single episode, complete remission (HCC) 06/21/2021   S/P exploratory laparotomy 06/21/2021   Mass of right breast 06/21/2021   Disability examination 06/21/2021   Pressure injury of skin 06/02/2021   Hyperkalemia 06/01/2021   Acute hyperkalemia 06/01/2021   PAF (paroxysmal atrial fibrillation) (HCC)  05/07/2021   Essential hypertension 05/07/2021   Pure hypercholesterolemia 05/07/2021   Debility  04/15/2021   Acute hypoxemic respiratory failure (HCC) 04/07/2021   Malnutrition of moderate degree 04/03/2021   Encounter for postanesthesia care 04/02/2021   Peritonitis (HCC) 04/02/2021   Endotracheally intubated 04/02/2021   Acute kidney injury superimposed on stage 4 chronic kidney disease (HCC) 04/02/2021   S/P ascending aortic replacement 03/25/2021   Encounter for weaning from ventilator [facility])    Status post surgery 03/24/2021   Tobacco abuse 07/30/2017   History of drug use 07/30/2017   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 BP Readings from Last 3 Encounters:  09/17/23 97/64  08/30/23 (!) 157/93  05/19/23 130/60   Wt Readings from Last 3 Encounters:  09/17/23 193 lb 2 oz (87.6 kg)  08/30/23 229 lb 0.9 oz (103.9 kg)  05/19/23 197 lb (89.4 kg)     Physical Exam Vitals and nursing note reviewed.  Constitutional:      Appearance: Normal appearance.  HENT:     Head: Normocephalic.  Eyes:     Extraocular Movements: Extraocular movements intact.     Pupils: Pupils are equal, round, and reactive to light.  Cardiovascular:     Rate and Rhythm: Normal rate and regular rhythm.  Pulmonary:     Effort: Pulmonary effort is normal.     Breath sounds: Normal breath sounds.  Musculoskeletal:     Cervical back: Normal range of motion and neck supple.  Neurological:     Mental Status: She is alert and oriented to person, place, and time.  Psychiatric:        Mood and Affect: Mood normal.        Thought Content: Thought content normal.      No results found for any visits on  12/24/23.    The ASCVD Risk score (Arnett DK, et al., 2019) failed to calculate for the following reasons:   Risk score cannot be calculated because patient has a medical history suggesting prior/existing ASCVD    Assessment & Plan:   Problem List Items Addressed This Visit       Cardiovascular and Mediastinum   Coronary artery disease due to lipid rich plaque - Primary   Relevant Medications   amiodarone  (PACERONE ) 200 MG tablet   apixaban  (ELIQUIS ) 5 MG TABS tablet   atorvastatin  (LIPITOR ) 80 MG tablet   midodrine  (PROAMATINE ) 5 MG tablet   PAF (paroxysmal atrial fibrillation) (HCC)   Per previous cardiology documentation, she has a history of postoperative atrial fibrillation for which she was started on amiodarone  and metoprolol .  Metoprolol  was d/c'd during recent hospitalization.  Currently on anticoagulation therapy.   -No medication changes today.  Will defer discontinuation of amiodarone  to cardiology discretion.  She was provided with contact information for cardiology today to arrange a follow-up appointment.      Relevant Medications   amiodarone  (PACERONE ) 200 MG tablet   apixaban  (ELIQUIS ) 5 MG TABS tablet   atorvastatin  (LIPITOR ) 80 MG tablet   midodrine  (PROAMATINE ) 5 MG tablet   Essential hypertension  Antihypertensives discontinued due to hypotension during hospitalization. - Discontinue amlodipine , Norvasc , and metoprolol .      Relevant Medications   amiodarone  (PACERONE ) 200 MG tablet   apixaban  (ELIQUIS ) 5 MG TABS tablet   atorvastatin  (LIPITOR ) 80 MG tablet   midodrine  (PROAMATINE ) 5 MG tablet   Hemodialysis-associated hypotension   Stable with midodrine  5 mg.  Refills provided today.        Relevant Medications   amiodarone  (PACERONE ) 200 MG tablet   apixaban  (ELIQUIS ) 5 MG TABS tablet   atorvastatin  (LIPITOR ) 80 MG tablet   midodrine  (PROAMATINE ) 5 MG tablet     Digestive   Gastroesophageal reflux disease   Relevant Medications    pantoprazole  (PROTONIX ) 40 MG tablet   sevelamer  (RENAGEL ) 800 MG tablet   ondansetron  (ZOFRAN ) 4 MG tablet     Nervous and Auditory   Incomplete paraplegia (HCC)   Need for assistance with activities of daily living Requires assistance, especially with showering. - Order home health services.      Relevant Orders   Ambulatory referral to Home Health     Genitourinary   Chronic kidney disease requiring chronic dialysis [facility])    End-stage renal disease on hemodialysis Requires hemodialysis three times weekly.      Relevant Medications   multivitamin (RENA-VIT) TABS tablet   sevelamer  (RENAGEL ) 800 MG tablet   ondansetron  (ZOFRAN ) 4 MG tablet   predniSONE  (DELTASONE ) 10 MG tablet   Other Relevant Orders   Ambulatory referral to Home Health     Other   Hospital discharge follow-up   Need for assistance with activities of daily living Requires assistance, especially with showering. - Order home health services.      Chronic midline low back pain without sciatica   - Continue lidocaine .      Relevant Medications   predniSONE  (DELTASONE ) 10 MG tablet   lidocaine  (LIDODERM ) 5 %   Other Relevant Orders   Ambulatory referral to Home Health       Return in about 3 months (around 03/24/2024) for chronic follow-up with PCP.    Leita Longs, FNP

## 2023-12-27 DIAGNOSIS — N186 End stage renal disease: Secondary | ICD-10-CM | POA: Insufficient documentation

## 2023-12-27 DIAGNOSIS — K219 Gastro-esophageal reflux disease without esophagitis: Secondary | ICD-10-CM | POA: Insufficient documentation

## 2023-12-27 DIAGNOSIS — I953 Hypotension of hemodialysis: Secondary | ICD-10-CM | POA: Insufficient documentation

## 2023-12-27 NOTE — Assessment & Plan Note (Signed)
 Stable with midodrine  5 mg.  Refills provided today.

## 2023-12-27 NOTE — Assessment & Plan Note (Signed)
 Per previous cardiology documentation, she has a history of postoperative atrial fibrillation for which she was started on amiodarone  and metoprolol .  Metoprolol  was d/c'd during recent hospitalization.  Currently on anticoagulation therapy.   -No medication changes today.  Will defer discontinuation of amiodarone  to cardiology discretion.  She was provided with contact information for cardiology today to arrange a follow-up appointment.

## 2023-12-27 NOTE — Assessment & Plan Note (Signed)
 End-stage renal disease on hemodialysis Requires hemodialysis three times weekly.

## 2023-12-27 NOTE — Assessment & Plan Note (Signed)
-   Continue lidocaine .

## 2023-12-27 NOTE — Assessment & Plan Note (Signed)
 Need for assistance with activities of daily living Requires assistance, especially with showering. - Order home health services.

## 2023-12-27 NOTE — Assessment & Plan Note (Signed)
 Antihypertensives discontinued due to hypotension during hospitalization. - Discontinue amlodipine , Norvasc , and metoprolol .

## 2023-12-28 ENCOUNTER — Other Ambulatory Visit (HOSPITAL_COMMUNITY): Payer: Self-pay

## 2023-12-28 ENCOUNTER — Telehealth: Payer: Self-pay | Admitting: Pharmacy Technician

## 2023-12-28 NOTE — Telephone Encounter (Signed)
 Pharmacy Patient Advocate Encounter   Received notification from CoverMyMeds that prior authorization for Lidocaine  5% patches is required/requested.   Insurance verification completed.   The patient is insured through [employer] .   Per test claim: PA required; PA submitted to above mentioned insurance via Latent Key/confirmation #/EOC [id number] Status is pending

## 2023-12-29 ENCOUNTER — Other Ambulatory Visit (HOSPITAL_COMMUNITY): Payer: Self-pay

## 2023-12-29 NOTE — Telephone Encounter (Signed)
 Pharmacy Patient Advocate Encounter  Received notification from [facility] [employer] that Prior Authorization for Lidocaine  5% patches has been DENIED.  Full denial letter will be uploaded to the media tab. See denial reason below.   PA #/Case ID/Reference #: [id number]

## 2023-12-31 ENCOUNTER — Telehealth: Payer: Self-pay

## 2023-12-31 ENCOUNTER — Encounter (HOSPITAL_BASED_OUTPATIENT_CLINIC_OR_DEPARTMENT_OTHER): Payer: Self-pay | Admitting: Family

## 2023-12-31 ENCOUNTER — Ambulatory Visit (INDEPENDENT_AMBULATORY_CARE_PROVIDER_SITE_OTHER): Admitting: Family

## 2023-12-31 VITALS — BP 122/78 | HR 82 | Resp 17 | Ht 67.0 in | Wt 151.0 lb

## 2023-12-31 DIAGNOSIS — I700 Atherosclerosis of aorta: Secondary | ICD-10-CM | POA: Diagnosis not present

## 2023-12-31 DIAGNOSIS — N186 End stage renal disease: Secondary | ICD-10-CM

## 2023-12-31 DIAGNOSIS — I10 Essential (primary) hypertension: Secondary | ICD-10-CM | POA: Diagnosis not present

## 2023-12-31 DIAGNOSIS — E785 Hyperlipidemia, unspecified: Secondary | ICD-10-CM | POA: Diagnosis not present

## 2023-12-31 NOTE — Patient Instructions (Addendum)
 Medication Instructions:   STOP TAKING AMIODARONE  NOW      Follow-Up: Please follow up in 6__ months in [facility] with Dr. Raford, Reche Finder, NP or Kristin Alvstad PharmD

## 2023-12-31 NOTE — Telephone Encounter (Signed)
 [facility] forms Noted Copied Sleeved Original placed provider box Copy placed front desk folder

## 2023-12-31 NOTE — Telephone Encounter (Signed)
 Faxed to [employer]. Copy sent to scan
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
## 2024-01-01 ENCOUNTER — Telehealth: Payer: Self-pay | Admitting: Family

## 2024-01-01 NOTE — Telephone Encounter (Signed)
 Spoke with patient regarding medications  Advised patient only medication change Dorothy Harris wanted to make yesterday was stopping Amiodarone  and blood pressure was good at visit yesterday  Advised will fax office note to dialysis center once Dorothy Harris has completed note, patient seen yesterday  Fax #[phone] Phone # [phone]

## 2024-01-01 NOTE — Telephone Encounter (Signed)
 Pt c/o medication issue:  1. Name of Medication: carvedilol  (COREG ) 12.5 MG tablet amLODipine  (NORVASC ) 10 MG tablet  2. How are you currently taking this medication (dosage and times per day)? 1 tablet (12.5 mg total) by mouth 2 (two) times daily with a meal.  TAKE 1 TABLET BY MOUTH DAILY   3. Are you having a reaction (difficulty breathing--STAT)? No  4. What is your medication issue? Pt is requesting a callback regarding him being off of these medications but was told at his Dialysis appt today that he should still be on medications. Please advise

## 2024-01-06 ENCOUNTER — Ambulatory Visit: Payer: Self-pay

## 2024-01-06 ENCOUNTER — Other Ambulatory Visit: Payer: Self-pay

## 2024-01-06 DIAGNOSIS — N186 End stage renal disease: Secondary | ICD-10-CM

## 2024-01-06 DIAGNOSIS — G8929 Other chronic pain: Secondary | ICD-10-CM

## 2024-01-06 DIAGNOSIS — G8222 Paraplegia, incomplete: Secondary | ICD-10-CM

## 2024-01-06 NOTE — Telephone Encounter (Signed)
 FYI Only or Action Required?: Action required by provider: states seen on the 18 th for back pain and wants pain medication called in.  Please call her back.  Patient was last seen in primary care on 12/24/2023 by Bevely Doffing, FNP.  Called Nurse Triage reporting Back Pain.  Symptoms began several weeks ago.  Interventions attempted: Nothing.  Symptoms are: gradually worsening.  Triage Disposition: Go to ED Now (Notify PCP)  Patient/caregiver understands and will follow disposition?: No, wishes to speak with PCP     Copied from CRM #[id number]. Topic: Clinical - Red Word Triage >> Jan 06, 2024  2:59 PM Dorothy Harris wrote: Red Word that prompted transfer to Nurse Triage: Patient states  she is currently having bad back pain, requesting an rx for oxycodone . NT Reason for Disposition  [1] SEVERE back pain (e.g., excruciating) AND [2] sudden onset AND [3] age > 60 years  Answer Assessment - Initial Assessment Questions 1. ONSET: When did the pain begin? (e.g., minutes, hours, days)     For awhile now seen on the 18 th 2. LOCATION: Where does it hurt? (upper, mid or lower back)     Lower back 3. SEVERITY: How bad is the pain?  (e.g., Scale 1-10; mild, moderate, or severe)     severe 4. PATTERN: Is the pain constant? (e.g., yes, no; constant, intermittent)      Comes and goes 5. RADIATION: Does the pain shoot into your legs or somewhere else?     denies 6. CAUSE:  What do you think is causing the back pain?      unknown 7. BACK OVERUSE:  Any recent lifting of heavy objects, strenuous work or exercise?     denies 8. MEDICINES: What have you taken so far for the pain? (e.g., nothing, acetaminophen , NSAIDS)     Tylenol  at dialysis 9. NEUROLOGIC SYMPTOMS: Do you have any weakness, numbness, or problems with bowel/bladder control?     denies 10. OTHER SYMPTOMS: Do you have any other symptoms? (e.g., fever, abdomen pain, burning with urination, blood in urine)        no 11. PREGNANCY: Is there any chance you are pregnant? When was your last menstrual period?       na  Protocols used: Back Pain-A-AH

## 2024-01-06 NOTE — Telephone Encounter (Signed)
 Faxed Caitlin's note to dialysis

## 2024-01-07 ENCOUNTER — Other Ambulatory Visit: Payer: Self-pay | Admitting: *Deleted

## 2024-01-07 DIAGNOSIS — N186 End stage renal disease: Secondary | ICD-10-CM

## 2024-01-08 ENCOUNTER — Emergency Department (HOSPITAL_COMMUNITY)

## 2024-01-08 ENCOUNTER — Encounter (HOSPITAL_COMMUNITY): Payer: Self-pay

## 2024-01-08 ENCOUNTER — Emergency Department (HOSPITAL_COMMUNITY)
Admission: EM | Admit: 2024-01-08 | Discharge: 2024-01-08 | Disposition: A | Discharge status: Home / Self Care | Source: Other Acute Inpatient Hospital | Attending: Emergency Medicine | Admitting: Emergency Medicine

## 2024-01-08 ENCOUNTER — Other Ambulatory Visit: Payer: Self-pay

## 2024-01-08 DIAGNOSIS — Z79899 Other long term (current) drug therapy: Secondary | ICD-10-CM | POA: Insufficient documentation

## 2024-01-08 DIAGNOSIS — Z7901 Long term (current) use of anticoagulants: Secondary | ICD-10-CM | POA: Insufficient documentation

## 2024-01-08 DIAGNOSIS — I480 Paroxysmal atrial fibrillation: Secondary | ICD-10-CM | POA: Insufficient documentation

## 2024-01-08 DIAGNOSIS — I120 Hypertensive chronic kidney disease with stage 5 chronic kidney disease or end stage renal disease: Secondary | ICD-10-CM | POA: Insufficient documentation

## 2024-01-08 DIAGNOSIS — M545 Low back pain, unspecified: Secondary | ICD-10-CM | POA: Insufficient documentation

## 2024-01-08 DIAGNOSIS — G8929 Other chronic pain: Secondary | ICD-10-CM | POA: Insufficient documentation

## 2024-01-08 DIAGNOSIS — Z992 Dependence on renal dialysis: Secondary | ICD-10-CM | POA: Insufficient documentation

## 2024-01-08 DIAGNOSIS — N186 End stage renal disease: Secondary | ICD-10-CM | POA: Diagnosis not present

## 2024-01-08 DIAGNOSIS — M549 Dorsalgia, unspecified: Secondary | ICD-10-CM | POA: Diagnosis present

## 2024-01-08 LAB — CBC WITH DIFFERENTIAL/PLATELET
Abs Immature Granulocytes: 0.17 K/uL — ABNORMAL HIGH (ref 0.00–0.07)
Basophils Absolute: 0 K/uL (ref 0.0–0.1)
Basophils Relative: 0 %
Eosinophils Absolute: 0 K/uL (ref 0.0–0.5)
Eosinophils Relative: 0 %
HCT: 33.2 % — ABNORMAL LOW (ref 36.0–46.0)
Hemoglobin: 11.1 g/dL — ABNORMAL LOW (ref 12.0–15.0)
Immature Granulocytes: 1 %
Lymphocytes Relative: 9 %
Lymphs Abs: 1.2 K/uL (ref 0.7–4.0)
MCH: 32.1 pg (ref 26.0–34.0)
MCHC: 33.4 g/dL (ref 30.0–36.0)
MCV: 96 fL (ref 80.0–100.0)
Monocytes Absolute: 0.7 K/uL (ref 0.1–1.0)
Monocytes Relative: 6 %
Neutro Abs: 10.4 K/uL — ABNORMAL HIGH (ref 1.7–7.7)
Neutrophils Relative %: 84 %
Platelets: 342 K/uL (ref 150–400)
RBC: 3.46 MIL/uL — ABNORMAL LOW (ref 3.87–5.11)
RDW: 18.4 % — ABNORMAL HIGH (ref 11.5–15.5)
WBC: 12.5 K/uL — ABNORMAL HIGH (ref 4.0–10.5)
nRBC: 0.4 % — ABNORMAL HIGH (ref 0.0–0.2)

## 2024-01-08 LAB — BASIC METABOLIC PANEL WITH GFR
Anion gap: 22 — ABNORMAL HIGH (ref 5–15)
BUN: 25 mg/dL — ABNORMAL HIGH (ref 8–23)
CO2: 22 mmol/L (ref 22–32)
Calcium: 9.6 mg/dL (ref 8.9–10.3)
Chloride: 90 mmol/L — ABNORMAL LOW (ref 98–111)
Creatinine, Ser: 7.13 mg/dL — ABNORMAL HIGH (ref 0.44–1.00)
GFR, Estimated: 6 mL/min — ABNORMAL LOW (ref >60)
Glucose, Bld: 108 mg/dL — ABNORMAL HIGH (ref 70–99)
Potassium: 3 mmol/L — ABNORMAL LOW (ref 3.5–5.1)
Sodium: 134 mmol/L — ABNORMAL LOW (ref 135–145)

## 2024-01-08 MED ORDER — LIDOCAINE 5 % EX PTCH
1.0000 | MEDICATED_PATCH | CUTANEOUS | Status: DC
  Administered 2024-01-08: 1 via TRANSDERMAL
  Filled 2024-01-08: qty 1

## 2024-01-08 MED ORDER — HYDROCODONE-ACETAMINOPHEN 5-325 MG PO TABS: 1.0000 | ORAL_TABLET | Freq: Two times a day (BID) | ORAL | 0 refills | Status: DC

## 2024-01-08 MED ORDER — SODIUM CHLORIDE 0.9 % IV BOLUS: 1000.0000 mL | Freq: Once | INTRAVENOUS | Status: DC

## 2024-01-08 MED ORDER — HYDROCODONE-ACETAMINOPHEN 5-325 MG PO TABS
1.0000 | ORAL_TABLET | Freq: Once | ORAL | Status: AC
  Administered 2024-01-08: 1 via ORAL
  Filled 2024-01-08: qty 1

## 2024-01-08 NOTE — Discharge Instructions (Addendum)
 Your lab test and x-rays today are reassuring.  We are sending you home, and have prescribed a small quantity of pain medicine to help you with your chronic back pain.  I do recommend you continuing to wear the lidocaine  pain patch as well.

## 2024-01-08 NOTE — ED Triage Notes (Signed)
 Pt BIB [facility] with complaints of back pain that has been going on for about a month. Patient was receiving dialysis treatment when her back was a 10/10 pain. Pt received only an hour of dialysis.

## 2024-01-08 NOTE — ED Provider Notes (Signed)
 [place] EMERGENCY DEPARTMENT AT [facility] Provider Note   CSN: [id number] Arrival date & time: 01/08/24  1247     Patient presents with: Back Pain   Dorothy Harris is a 63 y.o. female with a history including hypertension, paroxysmal A-fib, GERD, kidney failure on dialysis, abdominal aneurysm which is being tracked, presenting after about an hour into her dialysis treatment this morning, stating she could not tolerate the pain in her back and was therefore transferred here.  She states that she  uses lidocaine  pain patch but also is prescribed a pain pill which was not refilled at her most recent PCP visit about 2 weeks ago.  She did not know this was not being refilled, she states the prescription was just not fair when she picked up her medicines for the month.  Her pain has been worse since not having this medicine.  It is worse with movement, palpation, resolves at rest.  She states her pain began while she was hospitalized at [facility] in August where she was treated for uremic encephalopathy at which time her dialysis was initiated.   The history is provided by the patient and medical records.       Prior to Admission medications   Medication Sig Start Date End Date Taking? Authorizing Provider  Amino Acids -Protein Hydrolys (FEEDING SUPPLEMENT, PRO-STAT 64,) LIQD Take 30 mLs by mouth daily at 6 (six) AM.    [provider]  apixaban  (ELIQUIS ) 5 MG TABS tablet Take 1 tablet (5 mg total) by mouth 2 (two) times daily. 12/24/23   Bevely Doffing, FNP  atorvastatin  (LIPITOR ) 80 MG tablet Take 1 tablet (80 mg total) by mouth daily. 12/24/23   Bevely Doffing, FNP  ferrous sulfate  325 (65 FE) MG tablet Take 1 tablet (325 mg total) by mouth daily with breakfast. 04/25/21   Angiulli, Toribio PARAS, PA-C  fiber (NUTRISOURCE FIBER) PACK packet Take 1 packet by mouth 2 (two) times daily. 09/14/23   Tammy Sor, PA-C  HYDROcodone-acetaminophen  (NORCO/VICODIN) 5-325 MG  tablet Take 1 tablet by mouth 2 (two) times daily. 01/08/24   Shaunette Gassner, PA-C  lidocaine  (LIDODERM ) 5 % Place 1 patch onto the skin daily. Remove & Discard patch within 12 hours or as directed by MD 12/24/23   Bevely Doffing, FNP  melatonin 3 MG TABS tablet Take 2 tablets (6 mg total) by mouth at bedtime as needed. 08/29/23   Johnson, Clanford L, MD  midodrine  (PROAMATINE ) 5 MG tablet Take 1 tablet (5 mg total) by mouth daily. 12/24/23   Bevely Doffing, FNP  Multiple Vitamin (MULTIVITAMIN WITH MINERALS) TABS tablet Take 1 tablet by mouth daily. 04/13/21   Jerri Keys, MD  multivitamin (RENA-VIT) TABS tablet Take 1 tablet by mouth daily. 12/24/23   Bevely Doffing, FNP  ondansetron  (ZOFRAN ) 4 MG tablet Take 1 tablet (4 mg total) by mouth every 8 (eight) hours as needed for nausea or vomiting. 12/24/23   Bevely Doffing, FNP  pantoprazole  (PROTONIX ) 40 MG tablet Take 1 tablet (40 mg total) by mouth daily. 12/24/23 12/23/24  Bevely Doffing, FNP  predniSONE  (DELTASONE ) 10 MG tablet Take 1 tablet (10 mg total) by mouth daily with breakfast. 12/24/23   Bevely Doffing, FNP  sevelamer  (RENAGEL ) 800 MG tablet Take 1 tablet (800 mg total) by mouth 3 (three) times daily with meals. 12/24/23   Bevely Doffing, FNP    Allergies: Chlorthalidone     Review of Systems  Constitutional:  Negative for fever.  Respiratory:  Negative for shortness of breath.   Cardiovascular:  Negative for chest pain and leg swelling.  Gastrointestinal:  Negative for abdominal distention, abdominal pain and constipation.  Genitourinary:  Negative for difficulty urinating, dysuria, flank pain, frequency and urgency.  Musculoskeletal:  Positive for back pain. Negative for gait problem and joint swelling.  Skin:  Negative for rash.  Neurological:  Negative for weakness and numbness.    Updated Vital Signs BP (!) 144/102   Pulse 92   Temp 97.7 F (36.5 C) (Oral)   Resp 16   Ht 5' 8 (1.727 m)   Wt 69.4 kg   SpO2 98%   BMI 23.26 kg/m    Physical Exam Vitals and nursing note reviewed.  Constitutional:      Appearance: She is well-developed.  HENT:     Head: Normocephalic.  Eyes:     Conjunctiva/sclera: Conjunctivae normal.  Cardiovascular:     Rate and Rhythm: Normal rate.     Pulses: Normal pulses.     Comments: Pedal pulses normal. Pulmonary:     Effort: Pulmonary effort is normal.  Abdominal:     General: Bowel sounds are normal. There is no distension.     Palpations: Abdomen is soft. There is no mass.  Musculoskeletal:        General: Normal range of motion.     Cervical back: Normal range of motion and neck supple.     Lumbar back: Tenderness present. No swelling, edema or spasms.       Back:     Comments: Tenderness to palpation left lateral paralumbar back.  There is no midline tenderness to palpation.  Skin:    General: Skin is warm and dry.  Neurological:     Mental Status: She is alert.     Sensory: No sensory deficit.     Motor: No tremor or atrophy.     Gait: Gait normal.     Deep Tendon Reflexes:     Reflex Scores:      Patellar reflexes are 2+ on the right side and 2+ on the left side.      Achilles reflexes are 2+ on the right side and 2+ on the left side.    Comments: No strength deficit noted in hip and knee flexor and extensor muscle groups.  Ankle flexion and extension intact.     (all labs ordered are listed, but only abnormal results are displayed) Labs Reviewed  CBC WITH DIFFERENTIAL/PLATELET - Abnormal; Notable for the following components:      Result Value   WBC 12.5 (*)    RBC 3.46 (*)    Hemoglobin 11.1 (*)    HCT 33.2 (*)    RDW 18.4 (*)    nRBC 0.4 (*)    Neutro Abs 10.4 (*)    Abs Immature Granulocytes 0.17 (*)    All other components within normal limits  BASIC METABOLIC PANEL WITH GFR - Abnormal; Notable for the following components:   Sodium 134 (*)    Potassium 3.0 (*)    Chloride 90 (*)    Glucose, Bld 108 (*)    BUN 25 (*)    Creatinine, Ser 7.13 (*)     GFR, Estimated 6 (*)    Anion gap 22 (*)    All other components within normal limits    EKG: None  Radiology: DG Lumbar Spine Complete Result Date: 01/08/2024 CLINICAL DATA:  10/10 back pain EXAM: LUMBAR SPINE - COMPLETE 4+ VIEW COMPARISON:  CT  09/06/2023 FINDINGS: Five non rib-bearing lumbar type vertebra. Trace anterolisthesis L4 on L5. Vertebral body heights are maintained. Moderate disc space narrowing at L5 S1. Mild multilevel degenerative osteophytes. Lower lumbar facet degenerative changes. Partially visualized stent at the upper abdomen. Dense aortic atherosclerosis. Known infrarenal abdominal aortic aneurysm measures about 5.2 cm on sagittal image. IMPRESSION: 1. Trace anterolisthesis L4 on L5 with moderate degenerative changes at L5-S1. 2. Dense aortic atherosclerosis with known infrarenal abdominal aortic aneurysm measuring about 5.2 cm. Recommend follow-up CT or MR as appropriate in 6 months and referral to or continued care with vascular specialist. (Ref.: J Vasc Surg. 2018; 67:2-77 and J Am Coll Radiol 2013;10(10):789-794.) Electronically Signed   By: Luke Bun M.D.   On: 01/08/2024 16:15     Procedures   Medications Ordered in the ED  lidocaine  (LIDODERM ) 5 % 1 patch (1 patch Transdermal Patch Applied 01/08/24 1423)  HYDROcodone-acetaminophen  (NORCO/VICODIN) 5-325 MG per tablet 1 tablet (1 tablet Oral Given 01/08/24 1411)                                    Medical Decision Making Patient presenting from dialysis with complaint of chronic left lower back pain which has been present for the past month.  She has been out of her chronic pain medication for the last several weeks, still using a lidocaine  pain patch although she does not present with this in place today.  No fevers or chills, no recent falls or injury.  Patient does not make urine, no history of kidney stones.  Aside from localizing tenderness palpation left paralumbar her exam is otherwise unremarkable, no  abdominal pain, no guarding, no distention.  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  Amount and/or Complexity of Data Reviewed External Data Reviewed: radiology.    Details: 10/29/23  1. No evidence of thoracoabdominal aortic dissection.  2. Stable 5.3 cm infrarenal abdominal aortic aneurysm. Follow up CT is  suggested in 6 months.  3. Stable thoracic aortic stent.   Labs: ordered.    Details: Labs reviewed, relatively stable, her sodium is 134, she has a potassium of 3.0, this will not be replaced given her dialysis status, her BUN is 25, creatinine 7.13, she does have a WBC count of 12.5 and hemoglobin of 11.1 which is actually improved in comparison to prior hemoglobin.  Her last CBG in comparison here was 2.45, she had acute kidney failure and was admitted at [facility] in August at which point she started dialysis.  Her last creatinine during that admission was 7.15. Radiology: ordered.    Details: Lumbar imaging was obtained, she does have mild to moderate degenerative disc disease, no other acute lumbar findings.  Risk Prescription drug management.        Final diagnoses:  Chronic left-sided low back pain without sciatica    ED Discharge Orders          Ordered    HYDROcodone-acetaminophen  (NORCO/VICODIN) 5-325 MG tablet  2 times daily,   Status:  Discontinued        01/08/24 1727    HYDROcodone-acetaminophen  (NORCO/VICODIN) 5-325 MG tablet  2 times daily        01/08/24 1728               Leilah Polimeni, PA-C 01/08/24 1741    Towana Ozell BROCKS, MD 01/11/24 1225

## 2024-01-11 ENCOUNTER — Ambulatory Visit: Payer: Self-pay

## 2024-01-11 ENCOUNTER — Telehealth: Payer: Self-pay

## 2024-01-11 NOTE — Telephone Encounter (Signed)
 Patient asking for an rx for her pain. It is difficult for her to sit through dialysis sessions. States she was rx 3 days of hydrocodone-acetaminophen  in the hospital and it was helpful, but states she is ok with trying any. Is using salon pas patch, insurance would not cover rx lidocaine  patch.   Unable to schedule hospital f/u, no appts within 2 weeks. Please advise.  FYI Only or Action Required?: Action required by provider: medication refill request and update on patient condition.  Patient was last seen in primary care on 12/24/2023 by Bevely Doffing, FNP.  Called Nurse Triage reporting Hospitalization Follow-up.  Symptoms began several weeks ago.  Interventions attempted: OTC medications: Salon pas.  Symptoms are: unchanged.  Triage Disposition: Discuss With PCP and Callback by Nurse Today (overriding Call PCP Within 24 Hours)  Patient/caregiver understands and will follow disposition?: Yes  Reason for Disposition  Condition / symptoms SAME (not improving)  Answer Assessment - Initial Assessment Questions 1. MAIN CONCERN OR SYMPTOM:  What is your main concern right now? What question do you have? What's the main symptom you're worried about? (e.g., breathing difficulty, ankle swelling, weight gain.)     Lower back pain  2. ONSET:      Chronic  3. BETTER-SAME-WORSE: Are you getting better, staying the same, or getting worse compared to the day you were discharged?     Same, had temp improvement on hydrocodone-acetaminophen   4. DISCHARGE DIAGNOSIS:  What problem or disease were you hospitalized for?     Lumbar pain  Protocols used: Post-Hospitalization Follow-up Call-A-AH

## 2024-01-11 NOTE — Telephone Encounter (Signed)
 First attempt: LVM for patient to return call to [phone]  Copied from CRM #[id number]. Topic: Clinical - Medication Question >> Jan 11, 2024  8:23 AM Cleave MATSU wrote: Reason for CRM: pt is requesting oxycodone  for her back pain she stated that she went to the er on Friday at 1:30 and they gave her only 6 to take twice a day.

## 2024-01-11 NOTE — Telephone Encounter (Signed)
 Copied from CRM #[id number]. Topic: Clinical - Medication Question >> Jan 11, 2024  8:23 AM Cleave MATSU wrote: Reason for CRM: pt is requesting oxycodone  for her back pain she stated that she went to the er on Friday at 1:30 and they gave her only 6 to take twice a day. >> Jan 11, 2024  1:38 PM Zebedee SAUNDERS wrote: Pt would like a call back [phone] regard pain medication.

## 2024-01-12 ENCOUNTER — Other Ambulatory Visit: Payer: Self-pay

## 2024-01-12 DIAGNOSIS — G8929 Other chronic pain: Secondary | ICD-10-CM

## 2024-01-12 MED ORDER — HYDROCODONE-ACETAMINOPHEN 5-325 MG PO TABS: 1.0000 | ORAL_TABLET | Freq: Four times a day (QID) | ORAL | 0 refills | Status: AC | PRN

## 2024-01-13 ENCOUNTER — Telehealth: Payer: Self-pay

## 2024-01-13 NOTE — Telephone Encounter (Signed)
 Pt advised with verbal understanding

## 2024-01-13 NOTE — Telephone Encounter (Signed)
 Copied from CRM [id number]. Topic: General - Other >> Jan 12, 2024  4:52 PM Lauren C wrote: Reason for CRM: Olam with [facility] is calling because they reached out to pt and she said she wanted to start care this Thursday. She is wanting a verbal OK to delay care until 10/9. Lisa's callback number is [phone] with secure vm.

## 2024-01-14 NOTE — Telephone Encounter (Signed)
 Left voicemail to call back.

## 2024-01-18 NOTE — Telephone Encounter (Signed)
 Attempt #2 to call no answer   Reason for call documented in pts chart

## 2024-01-28 ENCOUNTER — Ambulatory Visit (HOSPITAL_COMMUNITY)
Admission: RE | Admit: 2024-01-28 | Discharge: 2024-01-28 | Disposition: A | Discharge status: Home / Self Care | Source: Ambulatory Visit | Attending: Vascular Surgery | Admitting: Vascular Surgery

## 2024-01-28 ENCOUNTER — Ambulatory Visit: Admitting: Vascular Surgery

## 2024-01-28 ENCOUNTER — Encounter: Payer: Self-pay | Admitting: Vascular Surgery

## 2024-01-28 ENCOUNTER — Ambulatory Visit (HOSPITAL_COMMUNITY): Admission: RE | Admit: 2024-01-28 | Discharge: 2024-01-28 | Attending: Vascular Surgery

## 2024-01-28 VITALS — BP 111/69 | HR 82 | Temp 97.2°F | Resp 18 | Ht 68.0 in | Wt 153.0 lb

## 2024-01-28 DIAGNOSIS — N186 End stage renal disease: Secondary | ICD-10-CM

## 2024-01-28 DIAGNOSIS — Z992 Dependence on renal dialysis: Secondary | ICD-10-CM | POA: Diagnosis not present
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
## 2024-01-29 ENCOUNTER — Telehealth: Payer: Self-pay

## 2024-01-29 ENCOUNTER — Other Ambulatory Visit: Payer: Self-pay

## 2024-01-29 DIAGNOSIS — F5101 Primary insomnia: Secondary | ICD-10-CM

## 2024-01-29 MED ORDER — RAMELTEON 8 MG PO TABS: 8.0000 mg | ORAL_TABLET | Freq: Every day | ORAL | 2 refills | Status: DC

## 2024-01-29 NOTE — Telephone Encounter (Signed)
 Copied from CRM [id number]. Topic: Clinical - Medication Question >> Jan 29, 2024  8:52 AM Deaijah H wrote: Reason for CRM: Patient stated she's having trouble sleeping at night and would like to be prescribed melatonin that was prescribed at her recent ER visit. Please call [phone] to confirm if script will be sent.

## 2024-01-29 NOTE — Telephone Encounter (Signed)
 Copied from CRM [id number]. Topic: Referral - Status >> Jan 29, 2024  3:09 PM Harlene ORN wrote: Reason for CRM: brought paperwork for PCP to fill out for Home Health Aid (caregiver). Just checking the status of the paperwork.

## 2024-02-01 ENCOUNTER — Emergency Department (HOSPITAL_COMMUNITY)
Admission: EM | Admit: 2024-02-01 | Discharge: 2024-02-01 | Disposition: A | Discharge status: Home / Self Care | Source: Ambulatory Visit | Attending: Emergency Medicine | Admitting: Emergency Medicine

## 2024-02-01 ENCOUNTER — Telehealth: Payer: Self-pay | Admitting: Pharmacy Technician

## 2024-02-01 ENCOUNTER — Emergency Department (HOSPITAL_COMMUNITY)

## 2024-02-01 ENCOUNTER — Encounter (HOSPITAL_COMMUNITY): Payer: Self-pay

## 2024-02-01 ENCOUNTER — Other Ambulatory Visit (HOSPITAL_COMMUNITY): Payer: Self-pay

## 2024-02-01 DIAGNOSIS — R531 Weakness: Secondary | ICD-10-CM | POA: Diagnosis not present

## 2024-02-01 DIAGNOSIS — Z992 Dependence on renal dialysis: Secondary | ICD-10-CM | POA: Insufficient documentation

## 2024-02-01 DIAGNOSIS — G8929 Other chronic pain: Secondary | ICD-10-CM | POA: Insufficient documentation

## 2024-02-01 DIAGNOSIS — R748 Abnormal levels of other serum enzymes: Secondary | ICD-10-CM | POA: Insufficient documentation

## 2024-02-01 DIAGNOSIS — M545 Low back pain, unspecified: Secondary | ICD-10-CM | POA: Diagnosis present

## 2024-02-01 LAB — COMPREHENSIVE METABOLIC PANEL WITH GFR
ALT: 269 U/L — ABNORMAL HIGH (ref 0–44)
AST: 86 U/L — ABNORMAL HIGH (ref 15–41)
Albumin: 4.4 g/dL (ref 3.5–5.0)
Alkaline Phosphatase: 308 U/L — ABNORMAL HIGH (ref 38–126)
Anion gap: 20 — ABNORMAL HIGH (ref 5–15)
BUN: 34 mg/dL — ABNORMAL HIGH (ref 8–23)
CO2: 17 mmol/L — ABNORMAL LOW (ref 22–32)
Calcium: 9.3 mg/dL (ref 8.9–10.3)
Chloride: 97 mmol/L — ABNORMAL LOW (ref 98–111)
Creatinine, Ser: 8.62 mg/dL — ABNORMAL HIGH (ref 0.44–1.00)
GFR, Estimated: 5 mL/min — ABNORMAL LOW (ref >60)
Glucose, Bld: 115 mg/dL — ABNORMAL HIGH (ref 70–99)
Potassium: 4.9 mmol/L (ref 3.5–5.1)
Sodium: 134 mmol/L — ABNORMAL LOW (ref 135–145)
Total Bilirubin: 0.8 mg/dL (ref 0.0–1.2)
Total Protein: 7.6 g/dL (ref 6.5–8.1)

## 2024-02-01 LAB — CBC WITH DIFFERENTIAL/PLATELET
Abs Immature Granulocytes: 0.18 K/uL — ABNORMAL HIGH (ref 0.00–0.07)
Basophils Absolute: 0 K/uL (ref 0.0–0.1)
Basophils Relative: 0 %
Eosinophils Absolute: 0 K/uL (ref 0.0–0.5)
Eosinophils Relative: 0 %
HCT: 35.7 % — ABNORMAL LOW (ref 36.0–46.0)
Hemoglobin: 11.6 g/dL — ABNORMAL LOW (ref 12.0–15.0)
Immature Granulocytes: 2 %
Lymphocytes Relative: 16 %
Lymphs Abs: 1.8 K/uL (ref 0.7–4.0)
MCH: 33.6 pg (ref 26.0–34.0)
MCHC: 32.5 g/dL (ref 30.0–36.0)
MCV: 103.5 fL — ABNORMAL HIGH (ref 80.0–100.0)
Monocytes Absolute: 0.7 K/uL (ref 0.1–1.0)
Monocytes Relative: 6 %
Neutro Abs: 8.5 K/uL — ABNORMAL HIGH (ref 1.7–7.7)
Neutrophils Relative %: 76 %
Platelets: 214 K/uL (ref 150–400)
RBC: 3.45 MIL/uL — ABNORMAL LOW (ref 3.87–5.11)
RDW: 20.2 % — ABNORMAL HIGH (ref 11.5–15.5)
WBC: 11.3 K/uL — ABNORMAL HIGH (ref 4.0–10.5)
nRBC: 0.2 % (ref 0.0–0.2)

## 2024-02-01 MED ORDER — HYDROCODONE-ACETAMINOPHEN 5-325 MG PO TABS
1.0000 | ORAL_TABLET | Freq: Once | ORAL | Status: AC
  Administered 2024-02-01: 1 via ORAL
  Filled 2024-02-01: qty 1

## 2024-02-01 MED ORDER — HYDROCODONE-ACETAMINOPHEN 5-325 MG PO TABS: 1.0000 | ORAL_TABLET | Freq: Three times a day (TID) | ORAL | 0 refills | Status: DC | PRN

## 2024-02-01 NOTE — Discharge Instructions (Signed)
 Today's evaluation has been generally reassuring, but some of your liver enzymes are abnormal.  This can occur in patients who are on dialysis, but as it is the first time these have been identified it is important to follow-up with your primary care physician for repeat testing.  Return here for concerning changes in your condition.

## 2024-02-01 NOTE — Telephone Encounter (Signed)
I don't have any paperwork on her

## 2024-02-01 NOTE — ED Provider Notes (Signed)
  EMERGENCY DEPARTMENT AT [facility] Provider Note   CSN: [id number] Arrival date & time: 02/01/24  1244     Patient presents with: Weakness and Back Pain (Left side)   Dorothy Harris is a 63 y.o. female.   HPI Presents from dialysis after an aborted session due to back pain, leg weakness. Patient knowledges multiple medical problems, states that before dialysis today she was feeling weaker than usual in her legs, requiring a wheelchair instead of her typical walker to get to the dialysis chair.  During dialysis she felt persistent weakness in her legs, and developed back pain, which she described as being uncomfortable in the chair.  No chest pain, no syncope, no dyspnea.  After 30 minutes patient had cessation of dialysis and was sent here for evaluation.    Prior to Admission medications   Medication Sig Start Date End Date Taking? Authorizing Provider  HYDROcodone-acetaminophen  (NORCO/VICODIN) 5-325 MG tablet Take 1 tablet by mouth 3 (three) times daily as needed for severe pain (pain score 7-10). 02/01/24  Yes Garrick Charleston, MD  Amino Acids -Protein Hydrolys (FEEDING SUPPLEMENT, PRO-STAT 64,) LIQD Take 30 mLs by mouth daily at 6 (six) AM.    [provider]  apixaban  (ELIQUIS ) 5 MG TABS tablet Take 1 tablet (5 mg total) by mouth 2 (two) times daily. 12/24/23   Bevely Doffing, FNP  atorvastatin  (LIPITOR ) 80 MG tablet Take 1 tablet (80 mg total) by mouth daily. 12/24/23   Bevely Doffing, FNP  ferrous sulfate  325 (65 FE) MG tablet Take 1 tablet (325 mg total) by mouth daily with breakfast. 04/25/21   Angiulli, Toribio PARAS, PA-C  fiber (NUTRISOURCE FIBER) PACK packet Take 1 packet by mouth 2 (two) times daily. 09/14/23   Tammy Sor, PA-C  lidocaine  (LIDODERM ) 5 % Place 1 patch onto the skin daily. Remove & Discard patch within 12 hours or as directed by MD 12/24/23   Bevely Doffing, FNP  melatonin 3 MG TABS tablet Take 2 tablets (6 mg total) by mouth at bedtime  as needed. 08/29/23   Johnson, Clanford L, MD  midodrine  (PROAMATINE ) 5 MG tablet Take 1 tablet (5 mg total) by mouth daily. 12/24/23   Bevely Doffing, FNP  Multiple Vitamin (MULTIVITAMIN WITH MINERALS) TABS tablet Take 1 tablet by mouth daily. 04/13/21   Jerri Keys, MD  multivitamin (RENA-VIT) TABS tablet Take 1 tablet by mouth daily. 12/24/23   Bevely Doffing, FNP  ondansetron  (ZOFRAN ) 4 MG tablet Take 1 tablet (4 mg total) by mouth every 8 (eight) hours as needed for nausea or vomiting. 12/24/23   Bevely Doffing, FNP  pantoprazole  (PROTONIX ) 40 MG tablet Take 1 tablet (40 mg total) by mouth daily. 12/24/23 12/23/24  Bevely Doffing, FNP  predniSONE  (DELTASONE ) 10 MG tablet Take 1 tablet (10 mg total) by mouth daily with breakfast. 12/24/23   Bevely Doffing, FNP  ramelteon (ROZEREM) 8 MG tablet Take 1 tablet (8 mg total) by mouth at bedtime. 01/29/24   Bevely Doffing, FNP  sevelamer  (RENAGEL ) 800 MG tablet Take 1 tablet (800 mg total) by mouth 3 (three) times daily with meals. 12/24/23   Bevely Doffing, FNP    Allergies: Chlorthalidone     Review of Systems  Updated Vital Signs BP 96/65 (BP Location: Left Wrist)   Pulse 79   Temp 97.9 F (36.6 C) (Oral)   Resp 16   Ht 1.727 m (5' 8)   Wt 69.4 kg   SpO2 100%   BMI 23.26 kg/m  Physical Exam Vitals and nursing note reviewed.  Constitutional:      General: She is not in acute distress.    Appearance: She is well-developed.  HENT:     Head: Normocephalic and atraumatic.  Eyes:     Conjunctiva/sclera: Conjunctivae normal.  Cardiovascular:     Rate and Rhythm: Normal rate and regular rhythm.  Pulmonary:     Effort: Pulmonary effort is normal. No respiratory distress.     Breath sounds: Normal breath sounds. No stridor.  Chest:    Abdominal:     General: There is no distension.  Skin:    General: Skin is warm and dry.  Neurological:     Mental Status: She is alert and oriented to person, place, and time.     Cranial Nerves: No cranial  nerve deficit.     Motor: Atrophy present.  Psychiatric:        Mood and Affect: Mood normal.     (all labs ordered are listed, but only abnormal results are displayed) Labs Reviewed  CBC WITH DIFFERENTIAL/PLATELET - Abnormal; Notable for the following components:      Result Value   WBC 11.3 (*)    RBC 3.45 (*)    Hemoglobin 11.6 (*)    HCT 35.7 (*)    MCV 103.5 (*)    RDW 20.2 (*)    Neutro Abs 8.5 (*)    Abs Immature Granulocytes 0.18 (*)    All other components within normal limits  COMPREHENSIVE METABOLIC PANEL WITH GFR - Abnormal; Notable for the following components:   Sodium 134 (*)    Chloride 97 (*)    CO2 17 (*)    Glucose, Bld 115 (*)    BUN 34 (*)    Creatinine, Ser 8.62 (*)    AST 86 (*)    ALT 269 (*)    Alkaline Phosphatase 308 (*)    GFR, Estimated 5 (*)    Anion gap 20 (*)    All other components within normal limits    EKG: EKG Interpretation Date/Time:  Monday February 01 2024 12:59:47 EDT Ventricular Rate:  80 PR Interval:  181 QRS Duration:  89 QT Interval:  431 QTC Calculation: 498 R Axis:   -30  Text Interpretation: Sinus rhythm LVH with secondary repolarization abnormality Borderline prolonged QT interval Confirmed by Garrick Charleston [phone]) on 02/01/2024 1:33:16 PM  Radiology: DG Chest 2 View Result Date: 02/01/2024 EXAM: 2 VIEW(S) XRAY OF THE CHEST 02/01/2024 01:59:00 PM COMPARISON: 11/10/2023 CLINICAL HISTORY: ESRD, weak, hypotensive. Pt comes by EMS for back pain. Pain has worsen over time. Pt also has general weakness. Pt only got 35 mins of dialysis txt before EMS was called. A\T\Ox4. FINDINGS: LINES, TUBES AND DEVICES: Tunneled right IJ CVC in place with tip at the right atrium. Telemetry leads overlie the chest. LUNGS AND PLEURA: No focal pulmonary opacity. No pulmonary edema. No pleural effusion. No pneumothorax. HEART AND MEDIASTINUM: Thoracic aortic stent graft noted. CABG markers noted. Sternotomy wires noted. No acute abnormality  of the cardiac and mediastinal silhouettes. BONES AND SOFT TISSUES: Right axillary surgical clips. No acute osseous abnormality. IMPRESSION: 1. No acute cardiopulmonary process. Electronically signed by: Norleen Boxer MD 02/01/2024 03:14 PM EDT RP Workstation: [id number]     Procedures   Medications Ordered in the ED  HYDROcodone-acetaminophen  (NORCO/VICODIN) 5-325 MG per tablet 1 tablet (1 tablet Oral Given 02/01/24 1405)  Medical Decision Making Elderly female on dialysis presents with back pain, leg weakness.  Broad differential including electrolyte abnormalities, dehydration, infection, less likely atypical ACS given denial of chest pain, new dyspnea.  Suspicion for chest pain volume status secondary to dialysis contributing to her symptoms. Patient is awake, alert, afebrile, low suspicion for bacteremia, sepsis. Cardiac 80 sinus normal pulse ox 99% room air normal  Amount and/or Complexity of Data Reviewed External Data Reviewed: notes. Labs: ordered. Decision-making details documented in ED Course. Radiology: ordered. ECG/medicine tests: independent interpretation performed. Decision-making details documented in ED Course.  Risk Prescription drug management.   3:19 PM Patient accompanied by her family member.  We discussed findings.  Patient feeling better, labs reviewed.  Notably, patient has not had liver enzymes in some time and today's levels are slightly abnormal, though she has no abdominal pain, nausea, vomiting.  Anion gap 20, similar to back pain presentation 1 month ago. I reviewed the patient's x-ray consistent with prior no evidence for acute cardiopulmonary process.  Patient Will discharge, will discuss abnormal liver enzyme testing with nephrology on follow-up, dialysis.  Absent evidence for pneumonia, bacteremia, sepsis, distress, hypotension, patient will follow-up closely with primary care and nephrology.     Final diagnoses:   Chronic bilateral low back pain without sciatica  Weakness  Elevated liver enzymes    ED Discharge Orders          Ordered    HYDROcodone-acetaminophen  (NORCO/VICODIN) 5-325 MG tablet  3 times daily PRN        02/01/24 1519               Garrick Charleston, MD 02/01/24 1519

## 2024-02-01 NOTE — Telephone Encounter (Signed)
 Pharmacy Patient Advocate Encounter   Received notification from Patient Pharmacy that prior authorization for Ramelteon 8MG  tablets is required/requested.   Insurance verification completed.   The patient is insured through [employer]   Per test claim:  zolpidem, zolpidem ER, zaleplon, OR eszopiclone is preferred by the insurance.  If suggested medication is appropriate, Please send in a new RX and discontinue this one. If not, please advise as to why it's not appropriate so that we may request a Prior Authorization. Please note, some preferred medications may still require a PA.  If the suggested medications have not been trialed and there are no contraindications to their use, the PA will not be submitted, as it will not be approved.  Patient has to have a trial/failure of at least one preferred medication before they will approve Ramelteon.

## 2024-02-01 NOTE — ED Triage Notes (Signed)
 Pt comes by EMS for back pain. Pain has worsen over time. Pt also has general weakness. Pt only got 35 mins of dialysis txt before EMS was called. A&Ox4.   110/64 BP 84 HR 99% RA

## 2024-02-02 NOTE — Telephone Encounter (Signed)
 Patient is checking on status of paperwork. Patient states she brought paperwork by office about 3 weeks ago. Patient is needing this for a home health Aid.   Patient frustrated as she hasn't heard back from anyone about paperwork.   Requesting callback as soon as possible: [phone]

## 2024-02-02 NOTE — Telephone Encounter (Signed)
 No paperwork has been seen for this pt

## 2024-02-03 ENCOUNTER — Telehealth: Payer: Self-pay

## 2024-02-03 NOTE — Telephone Encounter (Signed)
 [facility] forms - to help get an aid. Noted Copied Sle[employer]ed Original forms put in provider box Copy forms front desk folder  Fax forms to [employer] at [phone]

## 2024-02-08 NOTE — Telephone Encounter (Unsigned)
 Copied from CRM [id number]. Topic: Referral - Status >> Jan 29, 2024  3:09 PM Harlene ORN wrote: Reason for CRM: brought paperwork for PCP to fill out for Home Health Aid (caregiver). Just checking the status of the paperwork. >> Feb 08, 2024  8:56 AM Leonette SQUIBB wrote: Patient called asking about paperwork she brought to be signed from [employer] for house keeping.  She is wanting to know the status

## 2024-02-08 NOTE — Telephone Encounter (Signed)
 Update on therapy change to a preferred medication?

## 2024-02-11 ENCOUNTER — Other Ambulatory Visit: Payer: Self-pay

## 2024-02-11 DIAGNOSIS — F5101 Primary insomnia: Secondary | ICD-10-CM

## 2024-02-11 MED ORDER — TRAZODONE HCL 50 MG PO TABS: 25.0000 mg | ORAL_TABLET | Freq: Every evening | ORAL | 5 refills | Status: AC | PRN

## 2024-02-11 NOTE — Telephone Encounter (Signed)
 Please advise, patient paying out of pocket and only gets a check once a month needs to expedite these forms.

## 2024-02-11 NOTE — Telephone Encounter (Signed)
Medications changed

## 2024-02-12 NOTE — Telephone Encounter (Signed)
 Faxed Copy is at my desk Upfront ready for pick up

## 2024-02-12 NOTE — Telephone Encounter (Signed)
 Dorothy Harris care taker pick up forms

## 2024-02-23 ENCOUNTER — Ambulatory Visit: Payer: Self-pay

## 2024-02-23 ENCOUNTER — Telehealth: Payer: Self-pay | Admitting: *Deleted

## 2024-02-23 NOTE — Telephone Encounter (Signed)
 Received call from patient sister, Corean [phone]~ telephone.   Sister reports that patient has high liquid output in ileostomy and requested appointment with Dr. Kallie.   Advised that if ileostomy does not require revision, patient does not need to follow up with surgery. Advised to follow up with PCP or nephrology.

## 2024-02-23 NOTE — Telephone Encounter (Signed)
 FYI Only or Action Required?: FYI only for provider: appointment scheduled on 02/25/24.  Patient was last seen in primary care on 12/24/2023 by Bevely Doffing, FNP.  Called Nurse Triage reporting Diarrhea.  Symptoms began several months ago.  Interventions attempted: Other: dialysis M/W/F, attempt to call surgeon and was redirected to PCP.  Symptoms are: increased loose stool output from ileostomy gradually worsening.  Triage Disposition: See PCP When Office is Open (Within 3 Days) (overriding Call PCP Now)  Patient/caregiver understands and will follow disposition?: Yes           Reason for Disposition  SEVERE diarrhea (e.g., 1,500 ml or more a day; or twice as much as usual)  Answer Assessment - Initial Assessment Questions 1. TYPE: What type of ostomy do you have? (e.g., ileostomy, colostomy; temporary, long-term [permanent]).     Ileostomy.  2. LOCATION: Where is the ostomy located? (e.g., lower left abdomen)     Right side, near umbilicus.  3. DURATION: How long have you had the ostomy? (e.g., days, weeks, months, years).     2.5 years.  4. MAIN CONCERN: What is your main concern or symptom today? (e.g., skin redness or irritation, bleeding, leaking, change in output or appearance of ostomy).     Increase in output/liquid stool since going to dialysis (September 5th). Patient states she notices on her dialysis days (M,W,F) her ileostomy bag fills up every 30-45 minutes. She also states during dialysis they aren't pulling any fluid off her. She was told by the dialysis RN to follow up with PCP about this change.  5. DAILY OSTOMY OUTPUT DIARY: Do you keep a daily diary of the output from your ostomy?      No.  6. OSTOMY OUTPUT: How much stool output over a 24-hour period are you having? (e.g., X ml; consistency of water , milkshake, pudding) Is this less, the same, or more than you usually have?     More than her usual, over the past few months. Starts  off watery and after eating solid foods it will thicken up. She estimates it is about twice her baseline amount that is coming out.  7. OTHER SYMPTOMS: Are you having any other symptoms? (e.g., fever, vomiting, blood in stool, abdomen pain, dizziness)     Denies abdominal pain, blood in stool, vomiting, fever.  Protocols used: Ostomy Symptoms and Questions-A-AH

## 2024-02-23 NOTE — Telephone Encounter (Signed)
Noted patient scheduled

## 2024-02-25 ENCOUNTER — Ambulatory Visit: Payer: Self-pay | Admitting: Family Medicine

## 2024-03-01 ENCOUNTER — Ambulatory Visit

## 2024-03-01 VITALS — BP 96/69 | HR 72 | Resp 16 | Ht 67.0 in | Wt 145.1 lb

## 2024-03-01 DIAGNOSIS — K567 Ileus, unspecified: Secondary | ICD-10-CM

## 2024-03-01 DIAGNOSIS — G8929 Other chronic pain: Secondary | ICD-10-CM

## 2024-03-01 DIAGNOSIS — Z939 Artificial opening status, unspecified: Secondary | ICD-10-CM

## 2024-03-01 DIAGNOSIS — G4701 Insomnia due to medical condition: Secondary | ICD-10-CM

## 2024-03-01 DIAGNOSIS — M545 Low back pain, unspecified: Secondary | ICD-10-CM | POA: Diagnosis not present

## 2024-03-01 DIAGNOSIS — G8222 Paraplegia, incomplete: Secondary | ICD-10-CM

## 2024-03-01 MED ORDER — MELATONIN 12 MG PO TABS: 1.0000 | ORAL_TABLET | Freq: Every day | ORAL | 3 refills | Status: AC

## 2024-03-01 MED ORDER — HYDROCODONE-ACETAMINOPHEN 5-325 MG PO TABS: 1.0000 | ORAL_TABLET | ORAL | 0 refills | Status: AC | PRN

## 2024-03-01 MED ORDER — METHOCARBAMOL 750 MG PO TABS
750.0000 mg | ORAL_TABLET | Freq: Three times a day (TID) | ORAL | 5 refills | Status: AC | PRN
Start: 2024-03-01 — End: ?

## 2024-03-01 MED ORDER — FIBEREX 15 GM/30ML PO LIQD: 30.0000 mL | Freq: Every day | ORAL | 11 refills | Status: AC

## 2024-03-01 NOTE — Progress Notes (Signed)
 Established Patient Office Visit  Subjective   Patient ID: Dorothy Harris, female    DOB: September 13, 1960  Age: 63 y.o. MRN: [id number]  Chief Complaint  Patient presents with   Diarrhea    Pt states there is significant leakage from her stoma which is causing her to loose weight and have low energy    Back Pain    Pt complains of ongoing back pain for last several months. Pt states she has extreme back pain when sitting for dialysis. Cannot sleep at night     HPI Discussed the use of AI scribe software for clinical note transcription with the patient, who gave verbal consent to proceed.  History of Present Illness    Dorothy Harris is a 63 year old female with a history of ostomy who presents with increased ostomy output.  Increased ostomy output - Increased ostomy output since second surgery for blockage in May 2025 - Requires emptying ostomy bag every 30 to 45 minutes - Ostomy output described as orange juice-like liquid - Received fiber supplement in hospital to thicken ostomy output  Generalized weakness and fatigue - Significant weakness and fatigue attributed to high ostomy output - Often too weak to get out of bed to empty ostomy bag - Requires a trash can beside bed for ostomy bag disposal - Energy levels particularly low on dialysis days (three times per week)  Unintentional weight loss and nutritional concerns - Weight loss from 170 pounds to 145 pounds - Difficulty retaining nutrients due to rapid transit of food and fluids - Eats three meals per day except on dialysis days, when she avoids breakfast to prevent frequent interruptions during treatment  Back pain - Back pain localized to left side, worsened by prolonged sitting during dialysis - No radiation of pain down legs - Difficulty sleeping due to back pain  Sleep disturbance - Difficulty sleeping secondary to back pain - Uses melatonin for sleep, which was provided during previous hospital stay  Medication  use - Currently takes a blood pressure pill - No longer takes niraparib     ROS    Objective:     BP 96/69   Pulse 72   Resp 16   Ht 5' 7 (1.702 m)   Wt 145 lb 1.9 oz (65.8 kg)   SpO2 98%   BMI 22.73 kg/m  BP Readings from Last 3 Encounters:  03/01/24 96/69  02/01/24 96/65  01/28/24 111/69   Wt Readings from Last 3 Encounters:  03/01/24 145 lb 1.9 oz (65.8 kg)  02/01/24 153 lb (69.4 kg)  01/28/24 153 lb (69.4 kg)     Physical Exam Vitals and nursing note reviewed. Exam conducted with a chaperone present (pt's sister).  Constitutional:      General: She is not in acute distress.    Appearance: Normal appearance. She is not toxic-appearing.  HENT:     Head: Normocephalic.  Eyes:     Extraocular Movements: Extraocular movements intact.     Pupils: Pupils are equal, round, and reactive to light.  Cardiovascular:     Rate and Rhythm: Normal rate and regular rhythm.  Pulmonary:     Effort: Pulmonary effort is normal.     Breath sounds: Normal breath sounds.  Musculoskeletal:     Cervical back: Normal range of motion and neck supple.  Neurological:     Mental Status: She is alert and oriented to person, place, and time.     Gait: Gait abnormal (antalgic).  Psychiatric:  Mood and Affect: Mood normal.        Thought Content: Thought content normal.      No results found for any visits on 03/01/24.    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 No GI management in place. - Referred to ostomy clinic in [facility] for  management and potential interventions. - Prescribed Ibrex liquid to thicken output.      Relevant Orders   Amb Referral to [facility]     Nervous and Auditory   Incomplete paraplegia [facility])   Need for assistance with activities of daily living d/t left sided weakness.  Requires assistance, especially with showering.        Relevant Orders   Ambulatory referral to Orthopedic Surgery     Other   Chronic midline low back pain without sciatica   Chronic left-sided low back pain Pain localized to left side, worsened by prolonged sitting during dialysis. - Prescribed muscle relaxer before dialysis sessions.      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 No GI management in place. - Referred to ostomy clinic in [facility] for management and potential interventions. - Prescribed Ibrex liquid to thicken output.           Relevant Orders   Amb Referral to Ostomy Clinic   Insomnia due to medical condition   Insomnia linked to high-output ostomy and dialysis-related discomfort. - Prescribed melatonin for sleep aid.      Relevant Medications   Melatonin 12 MG TABS   Return in about 6 months (around 08/29/2024) for chronic follow-up with PCP.    Leita Longs, FNP

## 2024-03-06 DIAGNOSIS — Z939 Artificial opening status, unspecified: Secondary | ICD-10-CM | POA: Insufficient documentation

## 2024-03-06 DIAGNOSIS — G4701 Insomnia due to medical condition: Secondary | ICD-10-CM | POA: Insufficient documentation

## 2024-03-06 NOTE — Assessment & Plan Note (Signed)
 Chronic left-sided low back pain Pain localized to left side, worsened by prolonged sitting during dialysis. - Prescribed muscle relaxer before dialysis sessions.

## 2024-03-06 NOTE — Assessment & Plan Note (Signed)
 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 No GI management in place. - Referred to ostomy clinic in [facility] for management and potential interventions. - Prescribed Ibrex liquid to thicken output.

## 2024-03-06 NOTE — Assessment & Plan Note (Signed)
 Insomnia linked to high-output ostomy and dialysis-related discomfort. - Prescribed melatonin for sleep aid.

## 2024-03-06 NOTE — Assessment & Plan Note (Addendum)
 Need for assistance with activities of daily living d/t left sided weakness.  Requires assistance, especially with showering.

## 2024-03-23 ENCOUNTER — Emergency Department (HOSPITAL_COMMUNITY)

## 2024-03-23 ENCOUNTER — Encounter (HOSPITAL_COMMUNITY): Payer: Self-pay

## 2024-03-23 ENCOUNTER — Emergency Department (HOSPITAL_COMMUNITY)
Admission: EM | Admit: 2024-03-23 | Discharge: 2024-03-23 | Disposition: A | Discharge status: Home / Self Care | Source: Home / Self Care | Attending: Emergency Medicine | Admitting: Emergency Medicine

## 2024-03-23 DIAGNOSIS — M545 Low back pain, unspecified: Secondary | ICD-10-CM | POA: Diagnosis present

## 2024-03-23 DIAGNOSIS — W1830XA Fall on same level, unspecified, initial encounter: Secondary | ICD-10-CM | POA: Insufficient documentation

## 2024-03-23 DIAGNOSIS — Z7901 Long term (current) use of anticoagulants: Secondary | ICD-10-CM | POA: Insufficient documentation

## 2024-03-23 DIAGNOSIS — W19XXXA Unspecified fall, initial encounter: Secondary | ICD-10-CM

## 2024-03-23 MED ORDER — DEXAMETHASONE SOD PHOSPHATE PF 10 MG/ML IJ SOLN
10.0000 mg | Freq: Once | INTRAMUSCULAR | Status: AC
  Administered 2024-03-23: 08:00:00 10 mg via INTRAMUSCULAR

## 2024-03-23 MED ORDER — HYDROCODONE-ACETAMINOPHEN 5-325 MG PO TABS: 1.0000 | ORAL_TABLET | Freq: Four times a day (QID) | ORAL | 0 refills | Status: DC | PRN

## 2024-03-23 MED ORDER — MORPHINE SULFATE (PF) 4 MG/ML IV SOLN
4.0000 mg | Freq: Once | INTRAVENOUS | Status: AC
  Administered 2024-03-23: 08:00:00 4 mg via INTRAMUSCULAR
  Filled 2024-03-23: qty 1

## 2024-03-23 NOTE — ED Triage Notes (Addendum)
 Per EMS, Pt, from home, presents after a stumble and fall this morning.  Pt reports it exacerbated her chronic L flank pain.  Pain score 10/10.  Pt reports back pain since May 2025.  No new injury.    Pt reports she stumbled, grabbed her table, and lowered herself to the floor.   Pt reports she has an appointment w/ a spine specialist in January.

## 2024-03-23 NOTE — ED Notes (Signed)
 ED Provider at bedside.

## 2024-03-23 NOTE — ED Notes (Signed)
 Pt verbalized understanding of discharge instructions. Opportunity for questions provided.

## 2024-03-23 NOTE — Discharge Instructions (Signed)
 As discussed, your evaluation today has been largely reassuring.  But, it is important that you monitor your condition carefully, and do not hesitate to return to the ED if you develop new, or concerning changes in your condition. ? ?Otherwise, please follow-up with your physician for appropriate ongoing care. ? ?

## 2024-03-23 NOTE — ED Provider Notes (Signed)
 [place] EMERGENCY DEPARTMENT AT [facility] Provider Note   CSN: [id number] Arrival date & time: 03/23/24  9276     Patient presents with: Fall and Back Pain   Dorothy Harris is a 63 y.o. female.   HPI Patient presents with low back pain after a fall.  She has a history of chronic back pain and I have seen and evaluated the patient previously. She notes that today, she stumbled, how fell, have lowered self to the ground, but wrenched her left lower back while doing so.  Since that time she has had severe pain. She has not been able to take her home pain medication yet. No head trauma, loss of consciousness, or other complaints. EMS report patient was awake, alert, talkative in transport.     Prior to Admission medications  Medication Sig Start Date End Date Taking? Authorizing Provider  HYDROcodone -acetaminophen  (NORCO/VICODIN) 5-325 MG tablet Take 1 tablet by mouth every 6 (six) hours as needed for severe pain (pain score 7-10). 03/23/24  Yes Garrick Charleston, MD  Amino Acids -Protein Hydrolys (FEEDING SUPPLEMENT, PRO-STAT 64,) LIQD Take 30 mLs by mouth daily at 6 (six) AM.    [provider]  apixaban  (ELIQUIS ) 5 MG TABS tablet Take 1 tablet (5 mg total) by mouth 2 (two) times daily. 12/24/23   Bevely Doffing, FNP  atorvastatin  (LIPITOR ) 80 MG tablet Take 1 tablet (80 mg total) by mouth daily. 12/24/23   Bevely Doffing, FNP  ferrous sulfate  325 (65 FE) MG tablet Take 1 tablet (325 mg total) by mouth daily with breakfast. 04/25/21   Angiulli, Toribio PARAS, PA-C  fiber (NUTRISOURCE FIBER) PACK packet Take 1 packet by mouth 2 (two) times daily. 09/14/23   Tammy Sor, PA-C  Fiberex 15 GM/30ML LIQD Take 30 mLs by mouth daily. 03/01/24   Bevely Doffing, FNP  lidocaine  (LIDODERM ) 5 % Place 1 patch onto the skin daily. Remove & Discard patch within 12 hours or as directed by MD 12/24/23   Bevely Doffing, FNP  Melatonin 12 MG TABS Take 1 tablet by mouth at bedtime. 03/01/24    Bevely Doffing, FNP  methocarbamol  (ROBAXIN ) 750 MG tablet Take 1 tablet (750 mg total) by mouth every 8 (eight) hours as needed for muscle spasms (back pain). 03/01/24   Bevely Doffing, FNP  midodrine  (PROAMATINE ) 5 MG tablet Take 1 tablet (5 mg total) by mouth daily. 12/24/23   Bevely Doffing, FNP  Multiple Vitamin (MULTIVITAMIN WITH MINERALS) TABS tablet Take 1 tablet by mouth daily. 04/13/21   Jerri Keys, MD  multivitamin (RENA-VIT) TABS tablet Take 1 tablet by mouth daily. 12/24/23   Bevely Doffing, FNP  ondansetron  (ZOFRAN ) 4 MG tablet Take 1 tablet (4 mg total) by mouth every 8 (eight) hours as needed for nausea or vomiting. 12/24/23   Bevely Doffing, FNP  pantoprazole  (PROTONIX ) 40 MG tablet Take 1 tablet (40 mg total) by mouth daily. 12/24/23 12/23/24  Bevely Doffing, FNP  predniSONE  (DELTASONE ) 10 MG tablet Take 1 tablet (10 mg total) by mouth daily with breakfast. 12/24/23   Bevely Doffing, FNP  sevelamer  (RENAGEL ) 800 MG tablet Take 1 tablet (800 mg total) by mouth 3 (three) times daily with meals. 12/24/23   Bevely Doffing, FNP  traZODone  (DESYREL ) 50 MG tablet Take 0.5-1 tablets (25-50 mg total) by mouth at bedtime as needed for sleep. 02/11/24   Bevely Doffing, FNP    Allergies: Chlorthalidone     Review of Systems  Updated Vital Signs BP 128/72   Pulse  64   Temp 98.3 F (36.8 C) (Oral)   Resp 16   Ht 1.702 m (5' 7)   Wt 65.8 kg   SpO2 96%   BMI 22.71 kg/m   Physical Exam Vitals and nursing note reviewed.  Constitutional:      General: She is not in acute distress.    Appearance: She is well-developed.  HENT:     Head: Normocephalic and atraumatic.  Eyes:     Conjunctiva/sclera: Conjunctivae normal.  Cardiovascular:     Rate and Rhythm: Normal rate and regular rhythm.  Pulmonary:     Effort: Pulmonary effort is normal. No respiratory distress.     Breath sounds: No stridor.  Abdominal:     General: There is no distension.  Musculoskeletal:        General: No deformity.   Skin:    General: Skin is warm and dry.  Neurological:     Mental Status: She is alert and oriented to person, place, and time.     Cranial Nerves: No cranial nerve deficit.  Psychiatric:        Mood and Affect: Mood normal.     (all labs ordered are listed, but only abnormal results are displayed) Labs Reviewed - No data to display  EKG: None  Radiology: DG Hip Unilat W or Wo Pelvis 2-3 Views Right Result Date: 03/23/2024 CLINICAL DATA:  Fall with left-sided pain. EXAM: DG HIP (WITH OR WITHOUT PELVIS) 2-3V RIGHT COMPARISON:  Pelvis earlier same day. FINDINGS: Exam demonstrates no evidence of acute fracture or dislocation. Mild diffuse decreased bone mineralization. IMPRESSION: No acute findings. Electronically Signed   By: Toribio Agreste M.D.   On: 03/23/2024 10:05   DG Lumbar Spine 2-3 Views Result Date: 03/23/2024 CLINICAL DATA:  Low back pain after fall EXAM: LUMBAR SPINE - 2-3 VIEW COMPARISON:  January 08, 2024 FINDINGS: No fracture or spondylolisthesis is noted. Moderate degenerative disc disease is noted at L5-S1. Minimal degenerative changes are noted at L2-3 and L3-4 and L4-5. Calcified abdominal aortic aneurysm is noted. IMPRESSION: 1. Multilevel degenerative disc disease. No acute abnormality seen. 2. Calcified abdominal aortic aneurysm is noted as described on prior CT scan of September 06, 2023. Electronically Signed   By: Lynwood Landy Raddle M.D.   On: 03/23/2024 08:56   DG Pelvis 1-2 Views Result Date: 03/23/2024 CLINICAL DATA:  Low back pain after fall EXAM: PELVIS - 1-2 VIEW COMPARISON:  None Available. FINDINGS: Slightly irregular contour of right femoral neck is noted. Dedicated radiographs of the right hip is recommended for further evaluation. Otherwise no definite abnormality seen. IMPRESSION: Slightly irregular contour of right femoral neck is noted. Dedicated radiographs of the right hip are recommended for further evaluation. Electronically Signed   By: Lynwood Landy Raddle M.D.    On: 03/23/2024 08:54     Procedures   Medications Ordered in the ED  morphine  (PF) 4 MG/ML injection 4 mg (4 mg Intramuscular Given 03/23/24 0829)  dexamethasone  (DECADRON ) injection 10 mg (10 mg Intramuscular Given 03/23/24 0829)                                    Medical Decision Making Adult female with chronic back pain presents after fall with worsening low back pain. Given the patient's history, minor trauma, x-rays performed, IM medications ordered. Patient's initial neuroexam is reassuring, she moves both legs spontaneously, can flex each hip independently.  Amount and/or Complexity of Data Reviewed Independent Historian: EMS    Details: On arrival Radiology: ordered and independent interpretation performed. Decision-making details documented in ED Course.  Risk Prescription drug management. Decision regarding hospitalization. Diagnosis or treatment significantly limited by social determinants of health.   11:04 AM After initial x-rays warranted a second set of studies, patient had x-rays, is now no distress, hemodynamically unremarkable, no evidence for fracture. Suspicion for acute on chronic exacerbation of her pain due to the fall. Without other complaints, with reassuring x-rays, no decompensation and our she is discharged in stable condition.     Final diagnoses:  Fall, initial encounter    ED Discharge Orders          Ordered    HYDROcodone -acetaminophen  (NORCO/VICODIN) 5-325 MG tablet  Every 6 hours PRN        03/23/24 1104               Garrick Charleston, MD 03/23/24 1104

## 2024-03-29 ENCOUNTER — Ambulatory Visit (HOSPITAL_COMMUNITY)
Admission: RE | Admit: 2024-03-29 | Discharge: 2024-03-29 | Disposition: A | Discharge status: Home / Self Care | Source: Ambulatory Visit

## 2024-03-29 DIAGNOSIS — L24B3 Irritant contact dermatitis related to fecal or urinary stoma or fistula: Secondary | ICD-10-CM | POA: Diagnosis not present

## 2024-03-29 DIAGNOSIS — Z932 Ileostomy status: Secondary | ICD-10-CM | POA: Insufficient documentation

## 2024-03-29 DIAGNOSIS — Z432 Encounter for attention to ileostomy: Secondary | ICD-10-CM | POA: Diagnosis not present

## 2024-03-29 NOTE — Discharge Instructions (Signed)
 Needs high output pouch for after dialysis a Bedside drainage bag.  Stoma powder and skin prep  Barrier ring  Barrier strips 2 piece click  to call and get numbers

## 2024-03-29 NOTE — Progress Notes (Signed)
 [facility]   Reason for visit:  RLQ ileostomy, has not been in to clinic for some time. Having some issues with supplies.  Uses coloplast 2 piece click but unsure of product numbers to assist with set up HPI:  History ileus Past Medical History:  Diagnosis Date   Abdominal aneurysm    Anemia    Chronic kidney disease    Essential hypertension 05/07/2021   GERD (gastroesophageal reflux disease)    Hernia, epigastric    Hypertension    PAF (paroxysmal atrial fibrillation) (HCC) 05/07/2021   Pure hypercholesterolemia 05/07/2021   Family History  Problem Relation Age of Onset   Hypertension Mother    Diabetes Father    Diabetes Sister    Kidney disease Sister    Hypertension Sister    Hypertension Sister    Stroke Sister    Hypertension Sister    Diabetes Maternal Aunt    Breast cancer Maternal Aunt    Allergies[1] Current Outpatient Medications  Medication Sig Dispense Refill Last Dose/Taking   Amino Acids -Protein Hydrolys (FEEDING SUPPLEMENT, PRO-STAT 64,) LIQD Take 30 mLs by mouth daily at 6 (six) AM.      apixaban  (ELIQUIS ) 5 MG TABS tablet Take 1 tablet (5 mg total) by mouth 2 (two) times daily. 60 tablet 5    atorvastatin  (LIPITOR ) 80 MG tablet Take 1 tablet (80 mg total) by mouth daily. 30 tablet 11    ferrous sulfate  325 (65 FE) MG tablet Take 1 tablet (325 mg total) by mouth daily with breakfast. 30 tablet 0    fiber (NUTRISOURCE FIBER) PACK packet Take 1 packet by mouth 2 (two) times daily.      Fiberex 15 GM/30ML LIQD Take 30 mLs by mouth daily. 474 mL 11    HYDROcodone -acetaminophen  (NORCO/VICODIN) 5-325 MG tablet Take 1 tablet by mouth every 6 (six) hours as needed for severe pain (pain score 7-10). 28 tablet 0    lidocaine  (LIDODERM ) 5 % Place 1 patch onto the skin daily. Remove & Discard patch within 12 hours or as directed by MD 30 patch 5    Melatonin 12 MG TABS Take 1 tablet by mouth at bedtime. 90 tablet 3    methocarbamol  (ROBAXIN ) 750 MG  tablet Take 1 tablet (750 mg total) by mouth every 8 (eight) hours as needed for muscle spasms (back pain). 90 tablet 5    midodrine  (PROAMATINE ) 5 MG tablet Take 1 tablet (5 mg total) by mouth daily. 30 tablet 5    Multiple Vitamin (MULTIVITAMIN WITH MINERALS) TABS tablet Take 1 tablet by mouth daily.      multivitamin (RENA-VIT) TABS tablet Take 1 tablet by mouth daily. 30 tablet 5    ondansetron  (ZOFRAN ) 4 MG tablet Take 1 tablet (4 mg total) by mouth every 8 (eight) hours as needed for nausea or vomiting. 30 tablet 5    ondansetron  (ZOFRAN -ODT) 4 MG disintegrating tablet Take 1 tablet (4 mg total) by mouth every 8 (eight) hours as needed for nausea or vomiting. 12 tablet 0    pantoprazole  (PROTONIX ) 40 MG tablet Take 1 tablet (40 mg total) by mouth daily. 30 tablet 11    predniSONE  (DELTASONE ) 10 MG tablet Take 1 tablet (10 mg total) by mouth daily with breakfast. 30 tablet 5    sevelamer  (RENAGEL ) 800 MG tablet Take 1 tablet (800 mg total) by mouth 3 (three) times daily with meals. 90 tablet 5    traZODone  (DESYREL ) 50 MG tablet Take 0.5-1 tablets (25-50  mg total) by mouth at bedtime as needed for sleep. 30 tablet 5    No current facility-administered medications for this encounter.   ROS  Review of Systems  Constitutional:  Positive for fatigue.  Cardiovascular:        Hypertension Atrial fibrillation  Gastrointestinal:        RLQ ileostomy Occasional abdominal pain that comes and goes   Genitourinary:        On hemodialysis  Skin:  Positive for color change and rash.       Peristomal irritation  Psychiatric/Behavioral:  Positive for dysphoric mood.   All other systems reviewed and are negative.  Vital signs:  BP (!) 140/76   Pulse 67   Temp 98.2 F (36.8 C) (Oral)   Resp 18   SpO2 98%  Exam:  Physical Exam Vitals reviewed.  Constitutional:      Appearance: Normal appearance.  HENT:     Mouth/Throat:     Mouth: Mucous membranes are moist.  Cardiovascular:     Rate  and Rhythm: Normal rate.  Pulmonary:     Effort: Pulmonary effort is normal.  Abdominal:     Palpations: Abdomen is soft.  Skin:    General: Skin is warm and dry.     Findings: Erythema present.  Neurological:     General: No focal deficit present.     Mental Status: She is alert and oriented to person, place, and time.  Psychiatric:        Behavior: Behavior normal.     Comments: Flat affect     Stoma type/location:  RLQ ileostomy  Stomal assessment/size:  1 1/2  Peristomal assessment:  Irritation, redness  Stoma powder and skin prep  Treatment options for stomal/peristomal skin: barrier ring  2 piece click and barrier strips to perimeter.  Output: liquid brown stool.  Asking for high output pouch for use in dialysis.  Will need product numbers to find out if high output pouch available.  Ostomy pouching: 2pc.  Education provided:  demonstrated power and skin prep  use of barrier ring and barrier strips.  Will enroll with [facility].  She has used [facility] and [facility]  and encountered difficulty.     Impression/dx  Ileostomy Irritant contact dermatitis  Discussion  See above  added a few items to improve seal and possible a high output option. She states she has to empty every 30 mins during dialysis Plan  Will enroll with [facility].     Visit time: 55 minutes.   Darice Cooley FNP-BC        [1]  Allergies Allergen Reactions   Chlorthalidone  Other (See Comments)    ELEVATED KIDNEY FUNCTION

## 2024-04-01 ENCOUNTER — Ambulatory Visit: Payer: Self-pay

## 2024-04-01 ENCOUNTER — Emergency Department (HOSPITAL_COMMUNITY)
Admission: EM | Admit: 2024-04-01 | Discharge: 2024-04-01 | Discharge status: Against Medical Advice | Attending: Emergency Medicine | Admitting: Emergency Medicine

## 2024-04-01 ENCOUNTER — Other Ambulatory Visit: Payer: Self-pay

## 2024-04-01 ENCOUNTER — Encounter (HOSPITAL_COMMUNITY): Payer: Self-pay

## 2024-04-01 ENCOUNTER — Telehealth: Payer: Self-pay

## 2024-04-01 DIAGNOSIS — Z5321 Procedure and treatment not carried out due to patient leaving prior to being seen by health care provider: Secondary | ICD-10-CM | POA: Insufficient documentation

## 2024-04-01 DIAGNOSIS — M549 Dorsalgia, unspecified: Secondary | ICD-10-CM | POA: Insufficient documentation

## 2024-04-01 NOTE — ED Triage Notes (Signed)
 Pt states that she came from dialysis r/t back pain states that she was unable to finish dialysis r/t the pain from sitting up in the chair. States that she was only on the machine for about 20 mins.

## 2024-04-01 NOTE — Telephone Encounter (Signed)
"  ° °  Pre-operative Risk Assessment    Patient Name: LASHELLE KOY  DOB: 1960-08-09 MRN: [id number]   Date of last office visit: 12/31/23 Date of next office visit: Not scheduled   Request for Surgical Clearance    Procedure:  Left thigh AV graft placement  Date of Surgery:  Clearance TBD                                Surgeon:  Not provided Surgeon's Group or Practice Name:  [facility] Phone number:  [phone] Fax number:  [phone]   Type of Clearance Requested:   - Medical    Type of Anesthesia:  Not Indicated   Additional requests/questions:    Bonney Ival LOISE Gerome   04/01/2024, 3:48 PM   "

## 2024-04-01 NOTE — Telephone Encounter (Signed)
 FYI Only or Action Required?: Action required by provider: request for appointment, medication refill request, and clinical question for provider.  Patient was last seen in primary care on 03/01/2024 by Bevely Doffing, FNP.  Called Nurse Triage reporting Back Pain.  Symptoms began several days ago.  Interventions attempted: Nothing.  Symptoms are: unchanged.  Triage Disposition: See Physician Within 24 Hours  Patient/caregiver understands and will follow disposition?: No, wishes to speak with PCP   Copied from CRM #[id number]. Topic: Clinical - Red Word Triage >> Apr 01, 2024  8:39 AM Mia F wrote: Red Word that prompted transfer to Nurse Triage: Pt says she has back pain and rates it at a 10. She says it worsens when she goes to dialysis because she is sitting in the chair for 3 hours. She has to go to dialysis today. She is out of her pain meds and a refill request has been submitted but she says the pain is worse today Reason for Disposition  [1] Numbness in an arm or hand (i.e., loss of sensation) AND [2] upper back pain    Mid back pain  Answer Assessment - Initial Assessment Questions Patient requesting refill Norco, ED 03/25/24  Patient declines appt due to dialysis today and has 3 day notice for transportation.  Advised call back or ED if symptoms worsen. Patient verbalized understanding.  1. ONSET: When did the pain begin? (e.g., minutes, hours, days)     03/25/24 2. LOCATION: Where does it hurt? (upper, mid or lower back)     mid back, left side; 8/10, comes and goes,  3. SEVERITY: How bad is the pain?  (e.g., Scale 1-10; mild, moderate, or severe)     8/10 4. PATTERN: Is the pain constant? (e.g., yes, no; constant, intermittent)      Comes and goes 5. RADIATION: Does the pain shoot into your legs or somewhere else?     no 6. CAUSE:  What do you think is causing the back pain?      Prior fall 7. BACK OVERUSE:  Any recent lifting of heavy objects,  strenuous work or exercise?     no 8. MEDICINES: What have you taken so far for the pain? (e.g., nothing, acetaminophen , NSAIDS)     norco 9. NEUROLOGIC SYMPTOMS: Do you have any weakness, numbness, or problems with bowel/bladder control?     denies 10. OTHER SYMPTOMS: Do you have any other symptoms? (e.g., fever, abdomen pain, burning with urination, blood in urine) denies  Protocols used: Back Pain-A-AH

## 2024-04-01 NOTE — Telephone Encounter (Signed)
 Patient scheduled for pre-op  clearance on 04/05/24 with Orren Fabry, PA-C.     Patient Consent for Virtual Visit        Dorothy Harris has provided verbal consent on 04/01/2024 for a virtual visit (video or telephone).   CONSENT FOR VIRTUAL VISIT FOR:  Dorothy Harris  By participating in this virtual visit I agree to the following:  I hereby voluntarily request, consent and authorize [facility] and its employed or contracted physicians, physician assistants, nurse practitioners or other licensed health care professionals (the Practitioner), to provide me with telemedicine health care services (the Services) as deemed necessary by the treating Practitioner. I acknowledge and consent to receive the Services by the Practitioner via telemedicine. I understand that the telemedicine visit will involve communicating with the Practitioner through live audiovisual communication technology and the disclosure of certain medical information by electronic transmission. I acknowledge that I have been given the opportunity to request an in-person assessment or other available alternative prior to the telemedicine visit and am voluntarily participating in the telemedicine visit.  I understand that I have the right to withhold or withdraw my consent to the use of telemedicine in the course of my care at any time, without affecting my right to future care or treatment, and that the Practitioner or I may terminate the telemedicine visit at any time. I understand that I have the right to inspect all information obtained and/or recorded in the course of the telemedicine visit and may receive copies of available information for a reasonable fee.  I understand that some of the potential risks of receiving the Services via telemedicine include:  Delay or interruption in medical evaluation due to technological equipment failure or disruption; Information transmitted may not be sufficient (e.g. poor resolution  of images) to allow for appropriate medical decision making by the Practitioner; and/or  In rare instances, security protocols could fail, causing a breach of personal health information.  Furthermore, I acknowledge that it is my responsibility to provide information about my medical history, conditions and care that is complete and accurate to the best of my ability. I acknowledge that Practitioner's advice, recommendations, and/or decision may be based on factors not within their control, such as incomplete or inaccurate data provided by me or distortions of diagnostic images or specimens that may result from electronic transmissions. I understand that the practice of medicine is not an exact science and that Practitioner makes no warranties or guarantees regarding treatment outcomes. I acknowledge that a copy of this consent can be made available to me via my patient portal [facility] MyChart), or I can request a printed copy by calling the office of [facility].    I understand that my insurance will be billed for this visit.   I have read or had this consent read to me. I understand the contents of this consent, which adequately explains the benefits and risks of the Services being provided via telemedicine.  I have been provided ample opportunity to ask questions regarding this consent and the Services and have had my questions answered to my satisfaction. I give my informed consent for the services to be provided through the use of telemedicine in my medical care

## 2024-04-01 NOTE — Telephone Encounter (Signed)
" ° °  Name: Dorothy Harris  DOB: [birth year]  MRN: [id number]  Primary Cardiologist: Wilbert Bihari, MD   Preoperative team, please contact this patient and set up a phone call appointment for further preoperative risk assessment. Please obtain consent and complete medication review. Thank you for your help.  I confirm that guidance regarding antiplatelet and oral anticoagulation therapy has been completed and, if necessary, noted below.  None requested.   I also confirmed the patient resides in the state of [place] . As per [facility] Medical Board telemedicine laws, the patient must reside in the state in which the provider is licensed.   [name], NP 04/01/2024, 3:58 PM  [facility]    "

## 2024-04-01 NOTE — ED Triage Notes (Signed)
 Pt BIB ems from [facility] for chronic back pain. Pt has been seen multiple times for same. Pt did not have her dialysis states pain too bad.

## 2024-04-01 NOTE — Telephone Encounter (Signed)
 Copied from CRM #[id number]. Topic: Clinical - Medication Refill >> Apr 01, 2024  8:37 AM Mia F wrote: Medication: HYDROcodone -acetaminophen  (NORCO/VICODIN) 5-325 MG tablet   Has the patient contacted their pharmacy? No (Agent: If no, request that the patient contact the pharmacy for the refill. If patient does not wish to contact the pharmacy document the reason why and proceed with request.) (Agent: If yes, when and what did the pharmacy advise?)  This is the patient's preferred pharmacy:  [facility] - [place], [place] - [address] [place] [address] Phone: [phone] Fax: [phone]  Is this the correct pharmacy for this prescription? Yes If no, delete pharmacy and type the correct one.   Has the prescription been filled recently? Yes  Is the patient out of the medication? Yes  Has the patient been seen for an appointment in the last year OR does the patient have an upcoming appointment? Yes  Can we respond through MyChart? Yes  Agent: Please be advised that Rx refills may take up to 3 business days. We ask that you follow-up with your pharmacy.

## 2024-04-03 ENCOUNTER — Encounter (HOSPITAL_COMMUNITY): Payer: Self-pay

## 2024-04-03 ENCOUNTER — Emergency Department (HOSPITAL_COMMUNITY)
Admission: EM | Admit: 2024-04-03 | Discharge: 2024-04-03 | Disposition: A | Discharge status: Home / Self Care | Attending: Emergency Medicine | Admitting: Emergency Medicine

## 2024-04-03 ENCOUNTER — Emergency Department (HOSPITAL_COMMUNITY)

## 2024-04-03 DIAGNOSIS — I129 Hypertensive chronic kidney disease with stage 1 through stage 4 chronic kidney disease, or unspecified chronic kidney disease: Secondary | ICD-10-CM | POA: Diagnosis not present

## 2024-04-03 DIAGNOSIS — R112 Nausea with vomiting, unspecified: Secondary | ICD-10-CM | POA: Insufficient documentation

## 2024-04-03 DIAGNOSIS — N189 Chronic kidney disease, unspecified: Secondary | ICD-10-CM | POA: Diagnosis not present

## 2024-04-03 DIAGNOSIS — R109 Unspecified abdominal pain: Secondary | ICD-10-CM | POA: Insufficient documentation

## 2024-04-03 DIAGNOSIS — I4891 Unspecified atrial fibrillation: Secondary | ICD-10-CM | POA: Insufficient documentation

## 2024-04-03 DIAGNOSIS — E876 Hypokalemia: Secondary | ICD-10-CM | POA: Diagnosis not present

## 2024-04-03 DIAGNOSIS — Z7901 Long term (current) use of anticoagulants: Secondary | ICD-10-CM | POA: Insufficient documentation

## 2024-04-03 LAB — CBC WITH DIFFERENTIAL/PLATELET
Abs Immature Granulocytes: 0.24 K/uL — ABNORMAL HIGH (ref 0.00–0.07)
Basophils Absolute: 0 K/uL (ref 0.0–0.1)
Basophils Relative: 0 %
Eosinophils Absolute: 0 K/uL (ref 0.0–0.5)
Eosinophils Relative: 0 %
HCT: 33.9 % — ABNORMAL LOW (ref 36.0–46.0)
Hemoglobin: 10.7 g/dL — ABNORMAL LOW (ref 12.0–15.0)
Immature Granulocytes: 2 %
Lymphocytes Relative: 10 %
Lymphs Abs: 1.3 K/uL (ref 0.7–4.0)
MCH: 35.2 pg — ABNORMAL HIGH (ref 26.0–34.0)
MCHC: 31.6 g/dL (ref 30.0–36.0)
MCV: 111.5 fL — ABNORMAL HIGH (ref 80.0–100.0)
Monocytes Absolute: 0.7 K/uL (ref 0.1–1.0)
Monocytes Relative: 6 %
Neutro Abs: 9.9 K/uL — ABNORMAL HIGH (ref 1.7–7.7)
Neutrophils Relative %: 82 %
Platelets: 292 K/uL (ref 150–400)
RBC: 3.04 MIL/uL — ABNORMAL LOW (ref 3.87–5.11)
RDW: 16.2 % — ABNORMAL HIGH (ref 11.5–15.5)
Smear Review: NORMAL
WBC: 12.1 K/uL — ABNORMAL HIGH (ref 4.0–10.5)
nRBC: 0.2 % (ref 0.0–0.2)

## 2024-04-03 LAB — COMPREHENSIVE METABOLIC PANEL WITH GFR
ALT: 31 U/L (ref 0–44)
AST: 29 U/L (ref 15–41)
Albumin: 5 g/dL (ref 3.5–5.0)
Alkaline Phosphatase: 176 U/L — ABNORMAL HIGH (ref 38–126)
Anion gap: 34 — ABNORMAL HIGH (ref 5–15)
BUN: 41 mg/dL — ABNORMAL HIGH (ref 8–23)
CO2: 12 mmol/L — ABNORMAL LOW (ref 22–32)
Calcium: 10.2 mg/dL (ref 8.9–10.3)
Chloride: 90 mmol/L — ABNORMAL LOW (ref 98–111)
Creatinine, Ser: 14.6 mg/dL — ABNORMAL HIGH (ref 0.44–1.00)
GFR, Estimated: 3 mL/min — ABNORMAL LOW
Glucose, Bld: 171 mg/dL — ABNORMAL HIGH (ref 70–99)
Potassium: 5.2 mmol/L — ABNORMAL HIGH (ref 3.5–5.1)
Sodium: 136 mmol/L (ref 135–145)
Total Bilirubin: 0.4 mg/dL (ref 0.0–1.2)
Total Protein: 8.7 g/dL — ABNORMAL HIGH (ref 6.5–8.1)

## 2024-04-03 LAB — LIPASE, BLOOD: Lipase: 84 U/L — ABNORMAL HIGH (ref 11–51)

## 2024-04-03 MED ORDER — ONDANSETRON 4 MG PO TBDP: 4.0000 mg | ORAL_TABLET | Freq: Three times a day (TID) | ORAL | 0 refills | Status: DC | PRN

## 2024-04-03 MED ORDER — ONDANSETRON 4 MG PO TBDP
4.0000 mg | ORAL_TABLET | Freq: Once | ORAL | Status: AC
  Administered 2024-04-03: 4 mg via ORAL
  Filled 2024-04-03: qty 1

## 2024-04-03 MED ORDER — METOCLOPRAMIDE HCL 5 MG/ML IJ SOLN
10.0000 mg | Freq: Once | INTRAMUSCULAR | Status: AC
  Administered 2024-04-03: 10 mg via INTRAVENOUS
  Filled 2024-04-03: qty 2

## 2024-04-03 MED ORDER — DIPHENHYDRAMINE HCL 50 MG/ML IJ SOLN
12.5000 mg | Freq: Once | INTRAMUSCULAR | Status: AC
  Administered 2024-04-03: 12.5 mg via INTRAVENOUS
  Filled 2024-04-03: qty 1

## 2024-04-03 MED ORDER — ONDANSETRON HCL 4 MG/2ML IJ SOLN
4.0000 mg | Freq: Once | INTRAMUSCULAR | Status: AC
  Administered 2024-04-03: 4 mg via INTRAVENOUS
  Filled 2024-04-03: qty 2

## 2024-04-03 MED ORDER — ONDANSETRON 4 MG PO TBDP: 4.0000 mg | ORAL_TABLET | Freq: Three times a day (TID) | ORAL | 0 refills | Status: AC | PRN

## 2024-04-03 MED ORDER — SODIUM CHLORIDE 0.9 % IV SOLN: 12.5000 mg | INTRAVENOUS | Status: DC

## 2024-04-03 MED ORDER — FENTANYL CITRATE (PF) 100 MCG/2ML IJ SOLN
50.0000 ug | Freq: Once | INTRAMUSCULAR | Status: AC
  Administered 2024-04-03: 50 ug via INTRAVENOUS
  Filled 2024-04-03: qty 2

## 2024-04-03 MED ORDER — OXYCODONE HCL 5 MG PO TABS: 5.0000 mg | ORAL_TABLET | ORAL | 0 refills | Status: DC | PRN

## 2024-04-03 MED ORDER — SODIUM ZIRCONIUM CYCLOSILICATE 5 G PO PACK
10.0000 g | PACK | ORAL | Status: AC
  Administered 2024-04-03: 10 g via ORAL
  Filled 2024-04-03: qty 2

## 2024-04-03 MED ORDER — OXYCODONE HCL 5 MG PO TABS: 5.0000 mg | ORAL_TABLET | ORAL | Status: DC

## 2024-04-03 NOTE — ED Provider Notes (Signed)
" °  Physical Exam  BP 98/72   Pulse (!) 115   Temp 98.2 F (36.8 C) (Oral)   Resp 20   Ht 5' 7 (1.702 m)   Wt 65.8 kg   SpO2 100%   BMI 22.71 kg/m   Physical Exam  Procedures  Procedures  ED Course / MDM   Clinical Course as of 04/03/24 1656  Sun Apr 03, 2024  1605 Signed out to Dr Patsey.  [RP]    Clinical Course User Index [RP] Yolande Lamar BROCKS, MD   Medical Decision Making Amount and/or Complexity of Data Reviewed Labs: ordered. Radiology: ordered.  Risk Prescription drug management.   Nausea vomiting abdominal pain.  Mild hypokalemia.  Antiemetics and pain meds.  Possible admission.  Feeling somewhat better.  Eager to go home.  I think it is a reasonable for her to go home.  She will go to her dialysis in the morning.  Transfer prescription to the [facility] pharmacy so she can pick it up tonight.      Patsey Lot, MD 04/03/24 1657  "

## 2024-04-03 NOTE — ED Provider Notes (Signed)
 " [place] EMERGENCY DEPARTMENT AT [facility] Provider Note   CSN: [id number] Arrival date & time: 04/03/24  1147     Patient presents with: Abdominal Pain and Emesis   Dorothy Harris is a 63 y.o. female.   63 year old female history of AAA s/p repair, CKD on HD, chronic left flank pain, ischemic colitis status post colectomy, atrial fibrillation on Eliquis , and hypertension who presents to the emergency department with abdominal pain patient reports that she started having pain around her colostomy site as well as on her left flank for the past day.  Had several episodes of nonbloody nonbilious emesis as well as increased ostomy output over the past day as well.  Rates her pain as 10/10 at this point in time. Had HD Wednesday but got sick at dialysis and missed Friday. Has not yet tried any medications at home.  No marijuana use.       Prior to Admission medications  Medication Sig Start Date End Date Taking? Authorizing Provider  ondansetron  (ZOFRAN -ODT) 4 MG disintegrating tablet Take 1 tablet (4 mg total) by mouth every 8 (eight) hours as needed for nausea or vomiting. 04/03/24  Yes Yolande Lamar BROCKS, MD  oxyCODONE  (ROXICODONE ) 5 MG immediate release tablet Take 1 tablet (5 mg total) by mouth every 4 (four) hours as needed for severe pain (pain score 7-10). 04/03/24  Yes Yolande Lamar BROCKS, MD  Amino Acids -Protein Hydrolys (FEEDING SUPPLEMENT, PRO-STAT 64,) LIQD Take 30 mLs by mouth daily at 6 (six) AM.    [provider]  apixaban  (ELIQUIS ) 5 MG TABS tablet Take 1 tablet (5 mg total) by mouth 2 (two) times daily. 12/24/23   Bevely Doffing, FNP  atorvastatin  (LIPITOR ) 80 MG tablet Take 1 tablet (80 mg total) by mouth daily. 12/24/23   Bevely Doffing, FNP  ferrous sulfate  325 (65 FE) MG tablet Take 1 tablet (325 mg total) by mouth daily with breakfast. 04/25/21   Angiulli, Toribio PARAS, PA-C  fiber (NUTRISOURCE FIBER) PACK packet Take 1 packet by mouth 2 (two) times  daily. 09/14/23   Tammy Sor, PA-C  Fiberex 15 GM/30ML LIQD Take 30 mLs by mouth daily. 03/01/24   Bevely Doffing, FNP  HYDROcodone -acetaminophen  (NORCO/VICODIN) 5-325 MG tablet Take 1 tablet by mouth every 6 (six) hours as needed for severe pain (pain score 7-10). 03/23/24   Garrick Lamar, MD  lidocaine  (LIDODERM ) 5 % Place 1 patch onto the skin daily. Remove & Discard patch within 12 hours or as directed by MD 12/24/23   Bevely Doffing, FNP  Melatonin 12 MG TABS Take 1 tablet by mouth at bedtime. 03/01/24   Bevely Doffing, FNP  methocarbamol  (ROBAXIN ) 750 MG tablet Take 1 tablet (750 mg total) by mouth every 8 (eight) hours as needed for muscle spasms (back pain). 03/01/24   Bevely Doffing, FNP  midodrine  (PROAMATINE ) 5 MG tablet Take 1 tablet (5 mg total) by mouth daily. 12/24/23   Bevely Doffing, FNP  Multiple Vitamin (MULTIVITAMIN WITH MINERALS) TABS tablet Take 1 tablet by mouth daily. 04/13/21   Jerri Keys, MD  multivitamin (RENA-VIT) TABS tablet Take 1 tablet by mouth daily. 12/24/23   Bevely Doffing, FNP  ondansetron  (ZOFRAN ) 4 MG tablet Take 1 tablet (4 mg total) by mouth every 8 (eight) hours as needed for nausea or vomiting. 12/24/23   Bevely Doffing, FNP  pantoprazole  (PROTONIX ) 40 MG tablet Take 1 tablet (40 mg total) by mouth daily. 12/24/23 12/23/24  Bevely Doffing, FNP  predniSONE  (DELTASONE ) 10 MG  tablet Take 1 tablet (10 mg total) by mouth daily with breakfast. 12/24/23   Bevely Doffing, FNP  sevelamer  (RENAGEL ) 800 MG tablet Take 1 tablet (800 mg total) by mouth 3 (three) times daily with meals. 12/24/23   Bevely Doffing, FNP  traZODone  (DESYREL ) 50 MG tablet Take 0.5-1 tablets (25-50 mg total) by mouth at bedtime as needed for sleep. 02/11/24   Bevely Doffing, FNP    Allergies: Chlorthalidone     Review of Systems  Updated Vital Signs BP 121/83   Pulse 85   Temp 98.1 F (36.7 C) (Oral)   Resp 17   Ht 5' 7 (1.702 m)   Wt 65.8 kg   SpO2 96%   BMI 22.71 kg/m   Physical  Exam Constitutional:      General: She is not in acute distress.    Comments: Holding emesis basin  Abdominal:     General: There is no distension.     Palpations: There is no mass.     Tenderness: There is abdominal tenderness. There is no guarding.     Comments: Ostomy with small amount of brown output.  Stoma is beefy red.  Neurological:     Mental Status: She is alert.     (all labs ordered are listed, but only abnormal results are displayed) Labs Reviewed  COMPREHENSIVE METABOLIC PANEL WITH GFR - Abnormal; Notable for the following components:      Result Value   Potassium 5.2 (*)    Chloride 90 (*)    CO2 12 (*)    Glucose, Bld 171 (*)    BUN 41 (*)    Creatinine, Ser 14.60 (*)    Total Protein 8.7 (*)    Alkaline Phosphatase 176 (*)    GFR, Estimated 3 (*)    Anion gap 34 (*)    All other components within normal limits  LIPASE, BLOOD - Abnormal; Notable for the following components:   Lipase 84 (*)    All other components within normal limits  CBC WITH DIFFERENTIAL/PLATELET - Abnormal; Notable for the following components:   WBC 12.1 (*)    RBC 3.04 (*)    Hemoglobin 10.7 (*)    HCT 33.9 (*)    MCV 111.5 (*)    MCH 35.2 (*)    RDW 16.2 (*)    Neutro Abs 9.9 (*)    Abs Immature Granulocytes 0.24 (*)    All other components within normal limits    EKG: EKG Interpretation Date/Time:  Sunday April 03 2024 12:54:58 EST Ventricular Rate:  90 PR Interval:  170 QRS Duration:  91 QT Interval:  390 QTC Calculation: 478 R Axis:   -23  Text Interpretation: Sinus rhythm Probable left atrial enlargement Abnormal R-wave progression, early transition LVH with secondary repolarization abnormality Confirmed by Yolande Charleston [phone]) on 04/03/2024 2:34:16 PM  Radiology: CT ABDOMEN PELVIS WO CONTRAST Result Date: 04/03/2024 CLINICAL DATA:  Pain at stoma site.  Nausea, vomiting, and diarrhea. EXAM: CT ABDOMEN AND PELVIS WITHOUT CONTRAST TECHNIQUE: Multidetector CT  imaging of the abdomen and pelvis was performed following the standard protocol without IV contrast. RADIATION DOSE REDUCTION: This exam was performed according to the departmental dose-optimization program which includes automated exposure control, adjustment of the mA and/or kV according to patient size and/or use of iterative reconstruction technique. COMPARISON:  10/29/2023. FINDINGS: Lower chest: Central venous catheter terminates in the right atrium. There is a thoracic aortic aneurysm with endograft in place measuring 4.6 x 4.8 cm in the  descending thoracic aorta, not significantly changed from the prior exam. Atelectasis or scarring is noted in the lungs bilaterally. Hepatobiliary: There is a cyst in the caudate lobe of the liver. No biliary ductal dilatation. Stones are present within the gallbladder. Pancreas: Unremarkable. No pancreatic ductal dilatation or surrounding inflammatory changes. Spleen: Normal in size without focal abnormality. Adrenals/Urinary Tract: Thickening of the bilateral adrenal glands is unchanged. No renal calculus or hydronephrosis is seen bilaterally. The bladder is decompressed. Stomach/Bowel: The stomach is within normal limits. No bowel obstruction, free air, or pneumatosis is seen. There is a periumbilical hernia containing nonobstructed bowel. There is evidence of partial colectomy with right lower quadrant ileostomy. There is herniation of loops of small bowel in the subcutaneous fat at the ostomy site. No abscess or focal inflammatory changes are seen. Vascular/Lymphatic: Aortic atherosclerosis with infrarenal abdominal aortic aneurysm measuring 5.1 x 5.2 cm, not significantly changed from prior exam. No abdominal or pelvic lymphadenopathy is seen. 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 Reproductive: Uterus and bilateral adnexa are unremarkable. Other: No abdominopelvic ascites.  Musculoskeletal: Degenerative changes are present in the thoracolumbar spine. No acute osseous abnormality is seen. IMPRESSION: 1. No acute intra-abdominal process. 2. Partial colectomy with right lower quadrant ileostomy and peristomal herniation of small bowel. 3. Periumbilical hernia containing nonobstructed bowel. 4. Aortic atherosclerosis with infrarenal abdominal aortic aneurysm measuring up to 5.2 cm, not significantly changed from the prior exam. Recommend follow-up CT or MR as appropriate in 6 months and referral to or continued care with vascular specialist. (Ref.: J Vasc Surg. 2018; 67:2-77 and J Am Coll Radiol 2013;10(10):789-794.) 5. Cholelithiasis. Electronically Signed   By: Leita Birmingham M.D.   On: 04/03/2024 14:17     Procedures   Medications Ordered in the ED  fentaNYL  (SUBLIMAZE ) injection 50 mcg (50 mcg Intravenous Given 04/03/24 1247)  ondansetron  (ZOFRAN ) injection 4 mg (4 mg Intravenous Given 04/03/24 1247)  sodium zirconium cyclosilicate  (LOKELMA ) packet 10 g (10 g Oral Given 04/03/24 1507)  ondansetron  (ZOFRAN -ODT) disintegrating tablet 4 mg (4 mg Oral Given 04/03/24 1508)  fentaNYL  (SUBLIMAZE ) injection 50 mcg (50 mcg Intravenous Given 04/03/24 1611)  metoCLOPramide  (REGLAN ) injection 10 mg (10 mg Intravenous Given 04/03/24 1611)  diphenhydrAMINE  (BENADRYL ) injection 12.5 mg (12.5 mg Intravenous Given 04/03/24 1610)    Clinical Course as of 04/03/24 1634  Sun Apr 03, 2024  1605 Signed out to Dr Patsey.  [RP]    Clinical Course User Index [RP] Yolande Lamar BROCKS, MD                                 Medical Decision Making Amount and/or Complexity of Data Reviewed Labs: ordered. Radiology: ordered.  Risk Prescription drug management.   63 year old female history of AAA s/p repair, CKD on HD, chronic left flank pain, ischemic colitis status post colectomy, atrial fibrillation on Eliquis , and hypertension who presents to the emergency department with  abdominal pain patient reports that she started having pain around her colostomy site as well as on her left flank for the past day.  Initial Ddx:  Bowel obstruction, ileus, gastroenteritis, bowel ischemia, pancreatitis, cyclical vomiting  MDM/Course:  Patient resents emergency department for abdominal pain, nausea, vomiting, and increased ostomy output.  On exam is not in acute distress.  Is having some vomiting though.  Vital signs overall reassuring.  Does  have some mild diffuse abdominal tenderness to palpation on exam.  Stoma appears okay.  Labs were drawn which showed worsening renal function with a potassium of 5.2.  Has a mild leukocytosis of 12. Unfortunately since she was started on dialysis in September cannot get contrasted study (must be on dialysis for 6 months at our institution).  She has CT abdomen pelvis without contrast that did not show acute findings.   Was given Lokelma  for potassium and initially was tolerating p.o. but upon repeat evaluation started having recurrent nausea and vomiting.  Signed out to the oncoming physician pending p.o. challenge and repeat assessment.  This patient presents to the ED for concern of complaints listed in HPI, this involves an extensive number of treatment options, and is a complaint that carries with it a high risk of complications and morbidity. Disposition including potential need for admission considered.   Dispo: Pending remainder of workup  I have reviewed the patients home medications and made adjustments as needed Records reviewed Outpatient Clinic Notes The following labs were independently interpreted: Chemistry and show CKD I independently reviewed the following imaging with scope of interpretation limited to determining acute life threatening conditions related to emergency care: CT Abdomen/Pelvis and agree with the radiologist interpretation with the following exceptions: none I personally reviewed and interpreted cardiac  monitoring: normal sinus rhythm  I personally reviewed and interpreted the pt's EKG: see above for interpretation   Portions of this note were generated with Dragon dictation software. Dictation errors may occur despite best attempts at proofreading.     Final diagnoses:  Abdominal pain, unspecified abdominal location  Nausea and vomiting, unspecified vomiting type    ED Discharge Orders          Ordered    oxyCODONE  (ROXICODONE ) 5 MG immediate release tablet  Every 4 hours PRN        04/03/24 1543    ondansetron  (ZOFRAN -ODT) 4 MG disintegrating tablet  Every 8 hours PRN        04/03/24 1543               Yolande Lamar BROCKS, MD 04/03/24 1637  "

## 2024-04-03 NOTE — ED Notes (Signed)
 Pt verbalized understanding of discharge instructions. Opportunity for questions provided.

## 2024-04-03 NOTE — ED Notes (Signed)
 Patient transported to CT

## 2024-04-03 NOTE — ED Triage Notes (Signed)
 Per [facility], Pt, from home, c/o lower abdominal pain at colostomy site and n/v/d x1 day.  Also, Pt c/o chronic L flank pain.  Pain score 10/10.  Pt has not taken anything for symptoms.

## 2024-04-03 NOTE — Discharge Instructions (Signed)
 You were seen for your abdominal pain in the emergency department.   At home, please take tylenol .  You may also take the oxycodone  we have prescribed you for any breakthrough pain that may have.  Do not take this before driving or operating heavy machinery.  Do not take this medication with alcohol. Take the zofran  we have prescribed you.   Check your MyChart online for the results of any tests that had not resulted by the time you left the emergency department.   Follow-up with your primary doctor in 2-3 days regarding your visit.    Return immediately to the emergency department if you experience any of the following: worsening pain, vomiting despite the medication, or any other concerning symptoms.    Thank you for visiting our Emergency Department. It was a pleasure taking care of you today.

## 2024-04-03 NOTE — ED Notes (Signed)
 Pt reports her nausea is a little better and has become intermittent.  Pt sts her pain remains 10/10.  EDP aware.

## 2024-04-03 NOTE — ED Notes (Signed)
 Pt returned from CT

## 2024-04-04 ENCOUNTER — Encounter (HOSPITAL_COMMUNITY): Payer: Self-pay | Admitting: *Deleted

## 2024-04-04 ENCOUNTER — Other Ambulatory Visit (HOSPITAL_COMMUNITY): Payer: Self-pay | Admitting: Nurse Practitioner

## 2024-04-04 ENCOUNTER — Emergency Department (HOSPITAL_COMMUNITY)

## 2024-04-04 ENCOUNTER — Other Ambulatory Visit: Payer: Self-pay

## 2024-04-04 ENCOUNTER — Emergency Department (HOSPITAL_COMMUNITY)
Admission: EM | Admit: 2024-04-04 | Discharge: 2024-04-04 | Disposition: A | Discharge status: Home / Self Care | Source: Other Acute Inpatient Hospital | Attending: Emergency Medicine | Admitting: Emergency Medicine

## 2024-04-04 DIAGNOSIS — G8929 Other chronic pain: Secondary | ICD-10-CM | POA: Diagnosis not present

## 2024-04-04 DIAGNOSIS — M545 Low back pain, unspecified: Secondary | ICD-10-CM | POA: Insufficient documentation

## 2024-04-04 DIAGNOSIS — Z432 Encounter for attention to ileostomy: Secondary | ICD-10-CM

## 2024-04-04 DIAGNOSIS — I7143 Infrarenal abdominal aortic aneurysm, without rupture: Secondary | ICD-10-CM | POA: Diagnosis not present

## 2024-04-04 DIAGNOSIS — I129 Hypertensive chronic kidney disease with stage 1 through stage 4 chronic kidney disease, or unspecified chronic kidney disease: Secondary | ICD-10-CM | POA: Diagnosis not present

## 2024-04-04 DIAGNOSIS — Z79899 Other long term (current) drug therapy: Secondary | ICD-10-CM | POA: Diagnosis not present

## 2024-04-04 DIAGNOSIS — N184 Chronic kidney disease, stage 4 (severe): Secondary | ICD-10-CM | POA: Diagnosis not present

## 2024-04-04 DIAGNOSIS — Z87891 Personal history of nicotine dependence: Secondary | ICD-10-CM | POA: Insufficient documentation

## 2024-04-04 DIAGNOSIS — L24B3 Irritant contact dermatitis related to fecal or urinary stoma or fistula: Secondary | ICD-10-CM | POA: Insufficient documentation

## 2024-04-04 DIAGNOSIS — Z992 Dependence on renal dialysis: Secondary | ICD-10-CM | POA: Insufficient documentation

## 2024-04-04 DIAGNOSIS — Z7901 Long term (current) use of anticoagulants: Secondary | ICD-10-CM | POA: Insufficient documentation

## 2024-04-04 MED ORDER — OXYCODONE HCL 5 MG PO TABS
5.0000 mg | ORAL_TABLET | Freq: Once | ORAL | Status: AC
  Administered 2024-04-04: 5 mg via ORAL
  Filled 2024-04-04: qty 1

## 2024-04-04 MED ORDER — ONDANSETRON 4 MG PO TBDP
4.0000 mg | ORAL_TABLET | Freq: Once | ORAL | Status: AC
  Administered 2024-04-04: 4 mg via ORAL
  Filled 2024-04-04: qty 1

## 2024-04-04 MED ORDER — LIDOCAINE 5 % EX PTCH
1.0000 | MEDICATED_PATCH | Freq: Once | CUTANEOUS | Status: DC
  Filled 2024-04-04: qty 1

## 2024-04-04 MED ORDER — HYDROCODONE-ACETAMINOPHEN 5-325 MG PO TABS: 1.0000 | ORAL_TABLET | Freq: Four times a day (QID) | ORAL | 0 refills | Status: DC | PRN

## 2024-04-04 MED ORDER — ACETAMINOPHEN 500 MG PO TABS
1000.0000 mg | ORAL_TABLET | Freq: Once | ORAL | Status: AC
  Administered 2024-04-04: 1000 mg via ORAL
  Filled 2024-04-04: qty 2

## 2024-04-04 NOTE — Discharge Instructions (Addendum)
 It was a pleasure caring for you today in the emergency department.  Please follow-up with your PCP regarding your chronic low back pain.  Also would be reasonable to follow-up with pain clinic.  You have an enlargement to the aorta which is a large blood vessel in your belly.  Please follow-up with vascular surgery for further evaluation and repeat imaging in the next 6 months.  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  Please follow-up with spine specialist in the office.  Please return to the Emergency Department if you have any leg numbness, leg weakness, difficulty walking, fevers, worsening of pain, lightheadedness, lose consciousness, severe abdominal pain, severe headache, difficulty urinating, or difficulty having a bowel movement.   Please return to the emergency department for any worsening or worrisome symptoms.
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
## 2024-04-04 NOTE — ED Triage Notes (Signed)
 Pt BIB [facility] for chronic back pain has gotten worse, pt also c/o generalized weakness and numbness to bilateral legs which is new per pt taht comes and goes for past 2 days. Pt came from [facility] and had a full treatment.

## 2024-04-05 ENCOUNTER — Ambulatory Visit: Attending: Cardiology | Admitting: Physician Assistant

## 2024-04-05 DIAGNOSIS — Z01810 Encounter for preprocedural cardiovascular examination: Secondary | ICD-10-CM | POA: Diagnosis not present
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
## 2024-04-13 NOTE — Telephone Encounter (Signed)
 Requesting office calling to f/u on clearance. They ask it be faxed if it is ready. Please advise.    Surgeon:  Not provided Surgeon's Group or Practice Name:  [facility] Phone number:  [phone] Fax number:  [phone]

## 2024-04-21 ENCOUNTER — Ambulatory Visit: Admitting: Orthopedic Surgery

## 2024-04-21 ENCOUNTER — Ambulatory Visit

## 2024-04-28 ENCOUNTER — Telehealth: Payer: Self-pay | Admitting: Pharmacy Technician

## 2024-04-28 ENCOUNTER — Other Ambulatory Visit (HOSPITAL_COMMUNITY): Payer: Self-pay

## 2024-04-28 NOTE — Telephone Encounter (Signed)
 Pharmacy Patient Advocate Encounter   Received notification from [facility] that prior authorization for Sevelamer  800mg  is required/requested.   Insurance verification completed.   The patient is insured through [facility].   Per test claim: This medication can be billed to part B due to the pt's Kidney disease. The prescription was transferred to [facility], so they need you to resend the prescription to them with the diagnosis code on it, in order to bill part B.   Archived CMM [id number]

## 2024-05-06 ENCOUNTER — Telehealth: Payer: Self-pay

## 2024-05-06 NOTE — Telephone Encounter (Signed)
 Copied from CRM #[id number]. Topic: Clinical - Medication Refill >> May 06, 2024  9:52 AM Dorothy Harris wrote: Medication: HYDROcodone -acetaminophen  (NORCO/VICODIN) 5-325 MG tablet  Has the patient contacted their pharmacy? No (Agent: If no, request that the patient contact the pharmacy for the refill. If patient does not wish to contact the pharmacy document the reason why and proceed with request.) (Agent: If yes, when and what did the pharmacy advise?)  This is the patient's preferred pharmacy:    [facility] [address], [place] - [address] [place] [address] Phone: [phone] Fax: [phone]  Is this the correct pharmacy for this prescription? Yes If no, delete pharmacy and type the correct one.   Has the prescription been filled recently? No  Is the patient out of the medication? Yes  Has the patient been seen for an appointment in the last year OR does the patient have an upcoming appointment? Yes  Can we respond through MyChart? Yes  Agent: Please be advised that Rx refills may take up to 3 business days. We ask that you follow-up with your pharmacy.

## 2024-05-08 ENCOUNTER — Other Ambulatory Visit: Payer: Self-pay

## 2024-05-08 DIAGNOSIS — M545 Low back pain, unspecified: Secondary | ICD-10-CM

## 2024-05-08 MED ORDER — HYDROCODONE-ACETAMINOPHEN 5-325 MG PO TABS: 1.0000 | ORAL_TABLET | Freq: Four times a day (QID) | ORAL | 0 refills | Status: AC | PRN

## 2024-05-08 NOTE — Telephone Encounter (Signed)
Refill sent to [facility] 

## 2024-05-09 NOTE — Telephone Encounter (Signed)
 Patient advised

## 2024-08-30 ENCOUNTER — Ambulatory Visit
